# Patient Record
Sex: Female | Born: 1965 | Race: White | Hispanic: No | Marital: Married | Smoking: Never smoker
Health system: Southern US, Community
[De-identification: ages and names within clinical notes are randomized; demographics above are authoritative.]

## PROBLEM LIST (undated history)

## (undated) DIAGNOSIS — T7840XA Allergy, unspecified, initial encounter: Secondary | ICD-10-CM

## (undated) DIAGNOSIS — G709 Myoneural disorder, unspecified: Secondary | ICD-10-CM

## (undated) DIAGNOSIS — R011 Cardiac murmur, unspecified: Secondary | ICD-10-CM

## (undated) DIAGNOSIS — R7989 Other specified abnormal findings of blood chemistry: Secondary | ICD-10-CM

## (undated) DIAGNOSIS — K219 Gastro-esophageal reflux disease without esophagitis: Secondary | ICD-10-CM

## (undated) DIAGNOSIS — Z9889 Other specified postprocedural states: Secondary | ICD-10-CM

## (undated) DIAGNOSIS — M810 Age-related osteoporosis without current pathological fracture: Secondary | ICD-10-CM

## (undated) DIAGNOSIS — E119 Type 2 diabetes mellitus without complications: Secondary | ICD-10-CM

## (undated) DIAGNOSIS — J45909 Unspecified asthma, uncomplicated: Secondary | ICD-10-CM

## (undated) DIAGNOSIS — N189 Chronic kidney disease, unspecified: Secondary | ICD-10-CM

## (undated) DIAGNOSIS — I639 Cerebral infarction, unspecified: Secondary | ICD-10-CM

## (undated) DIAGNOSIS — R112 Nausea with vomiting, unspecified: Secondary | ICD-10-CM

## (undated) DIAGNOSIS — D649 Anemia, unspecified: Secondary | ICD-10-CM

## (undated) DIAGNOSIS — Z87442 Personal history of urinary calculi: Secondary | ICD-10-CM

## (undated) DIAGNOSIS — I10 Essential (primary) hypertension: Secondary | ICD-10-CM

## (undated) DIAGNOSIS — F419 Anxiety disorder, unspecified: Secondary | ICD-10-CM

## (undated) HISTORY — DX: Type 2 diabetes mellitus without complications: E11.9

## (undated) HISTORY — DX: Unspecified asthma, uncomplicated: J45.909

## (undated) HISTORY — DX: Allergy, unspecified, initial encounter: T78.40XA

## (undated) HISTORY — DX: Cardiac murmur, unspecified: R01.1

## (undated) HISTORY — DX: Essential (primary) hypertension: I10

## (undated) HISTORY — DX: Other specified abnormal findings of blood chemistry: R79.89

## (undated) HISTORY — DX: Other disorders of calcium metabolism: E83.59

## (undated) HISTORY — DX: Age-related osteoporosis without current pathological fracture: M81.0

---

## 1998-08-31 DIAGNOSIS — Z87442 Personal history of urinary calculi: Secondary | ICD-10-CM

## 1998-08-31 HISTORY — DX: Personal history of urinary calculi: Z87.442

## 2002-08-31 HISTORY — PX: ROTATOR CUFF REPAIR: SHX139

## 2002-08-31 HISTORY — PX: SHOULDER CLOSED REDUCTION: SHX1051

## 2004-09-18 ENCOUNTER — Emergency Department: Payer: Self-pay | Admitting: Emergency Medicine

## 2004-09-19 IMAGING — CT CT ABD-PELV W/O CM
1 of 3 series · 15 of 32 positions shown, 19 images · non-contrast
Comparison: none

REASON FOR EXAM: (1) rt side pain / stone protocol / [HOSPITAL]; (2) rt side
pain / stone protocol /
COMMENTS:

[Series 3: inspace · axial · 0.71mm/px · z∈[+297,+778]mm · 15 of 752 slices shown, 19 images]
[im 32/752  soft-tissue]
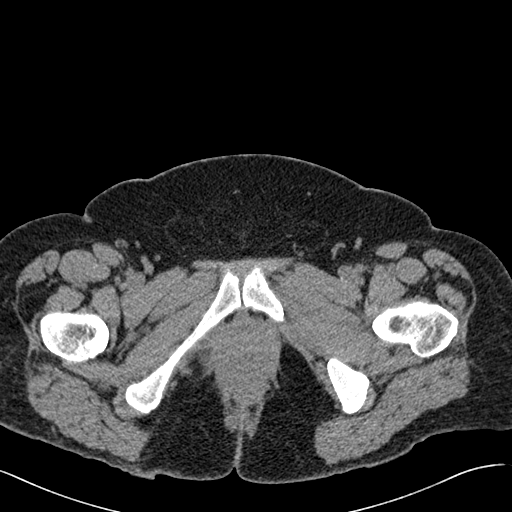
[im 32/752  bone]
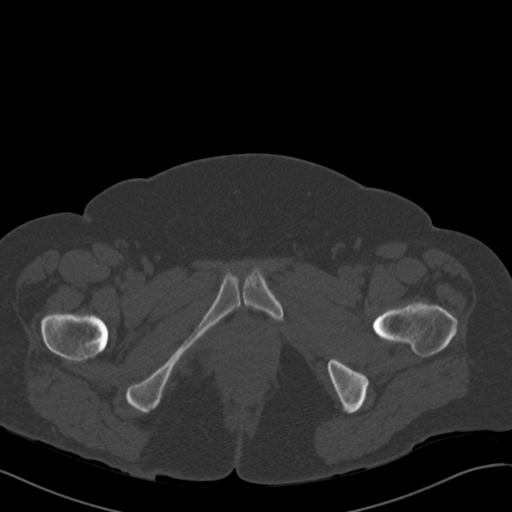
[im 94/752  soft-tissue]
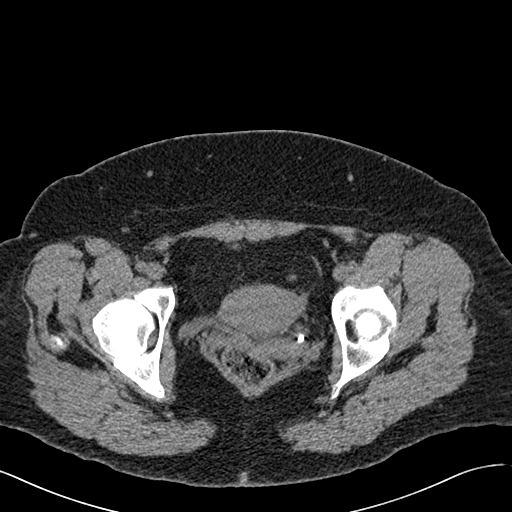
[im 157/752  soft-tissue]
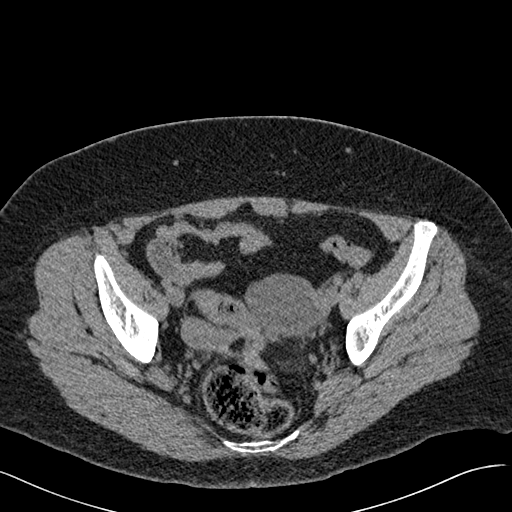
[im 220/752  soft-tissue]
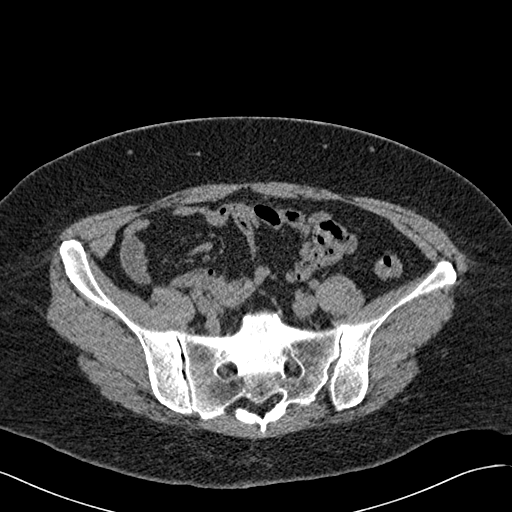
[im 251/752  soft-tissue]
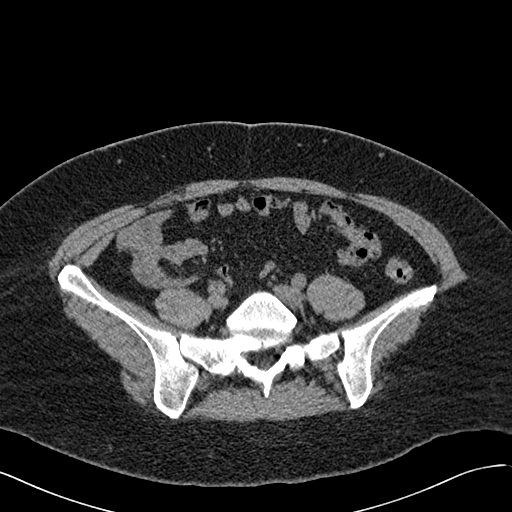
[im 313/752  soft-tissue]
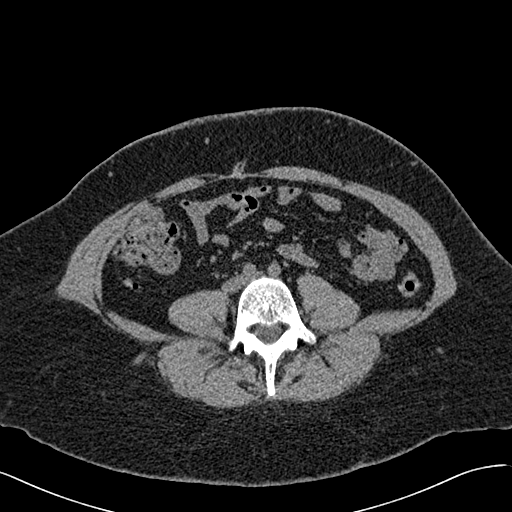
[im 376/752  soft-tissue]
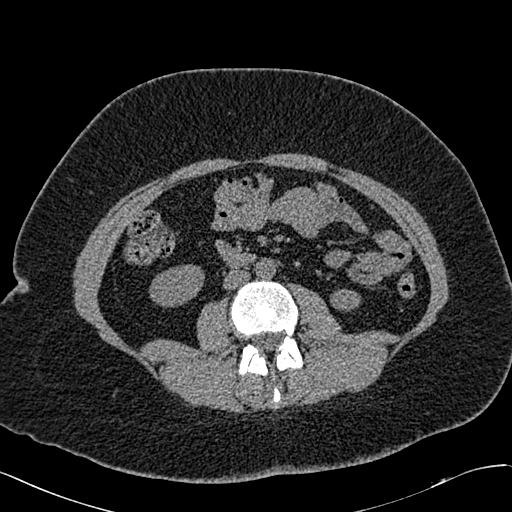
[im 439/752  soft-tissue]
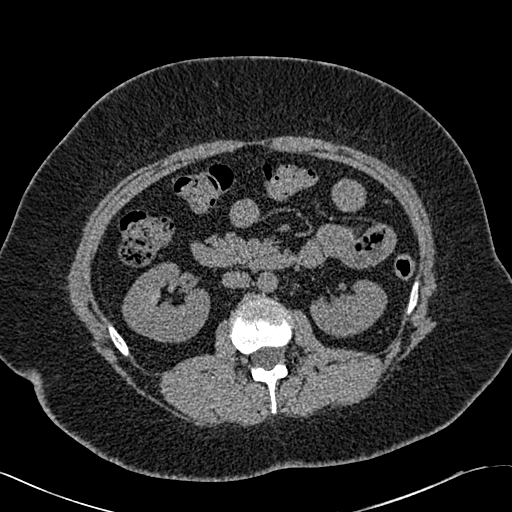
[im 501/752  soft-tissue]
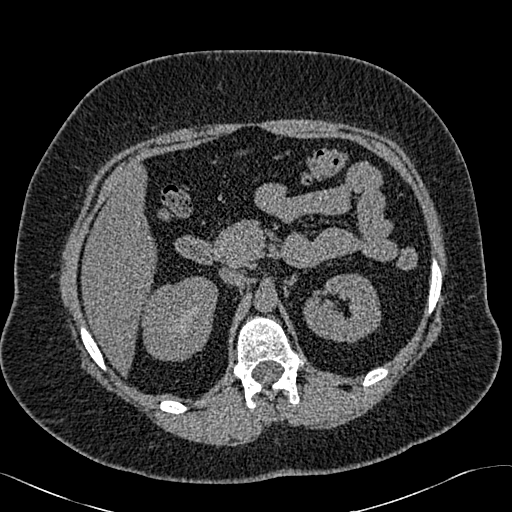
[im 501/752  bone]
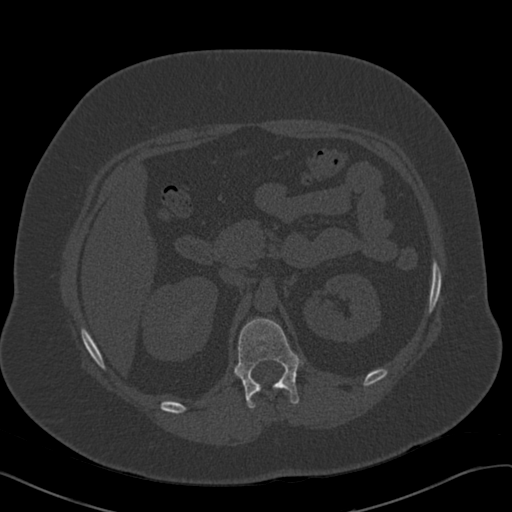
[im 532/752  soft-tissue]
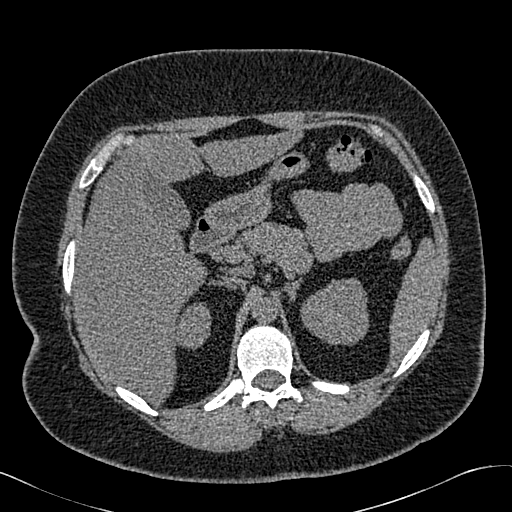
[im 595/752  soft-tissue]
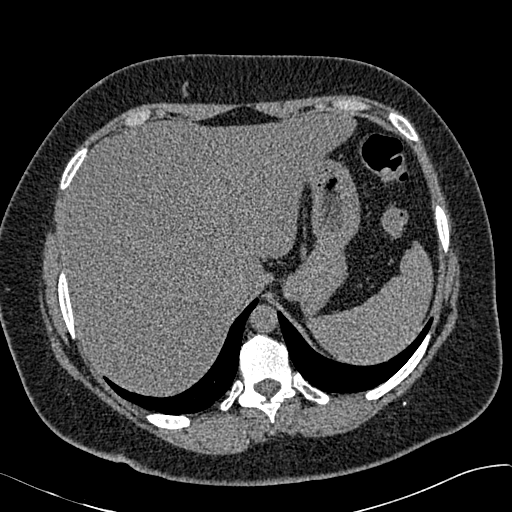
[im 626/752  lung]
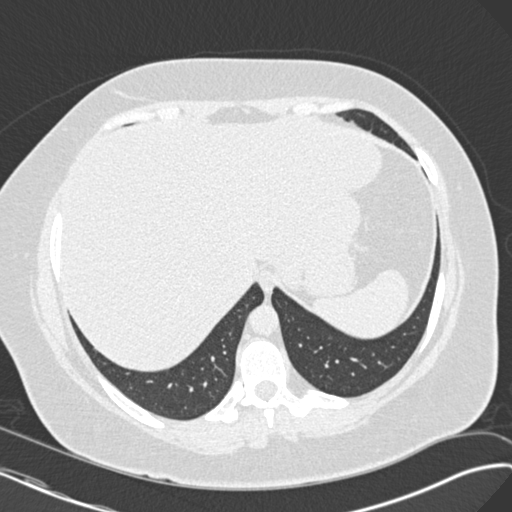
[im 658/752  soft-tissue]
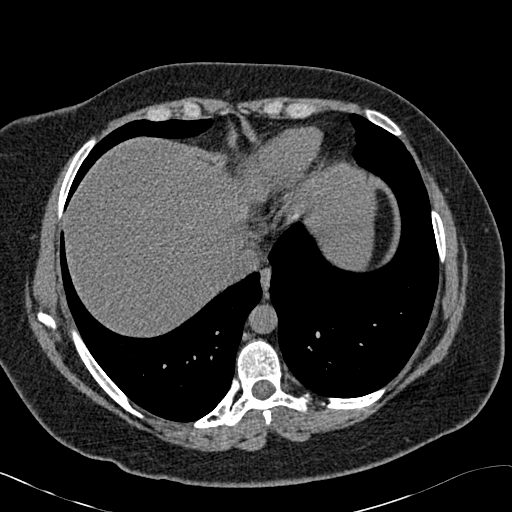
[im 658/752  lung]
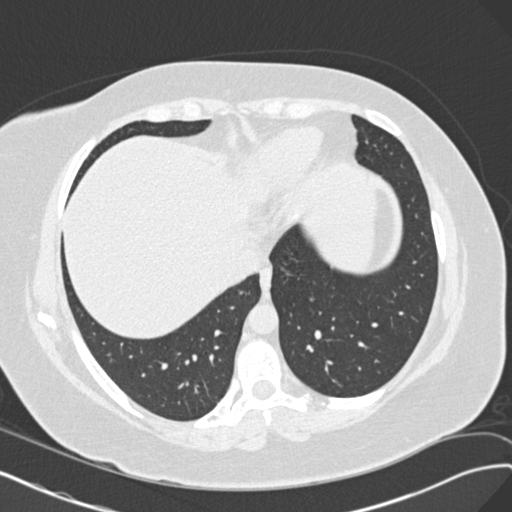
[im 689/752  lung]
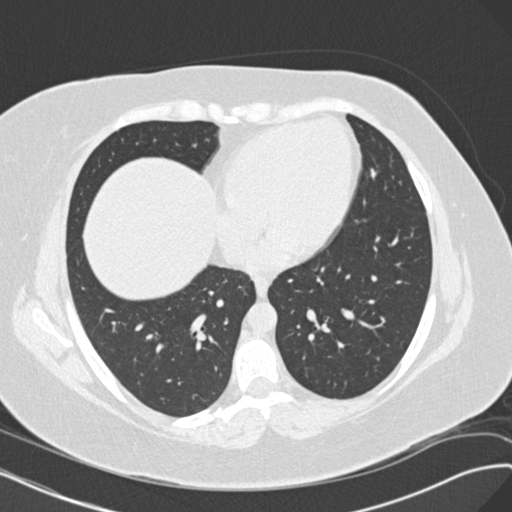
[im 720/752  soft-tissue]
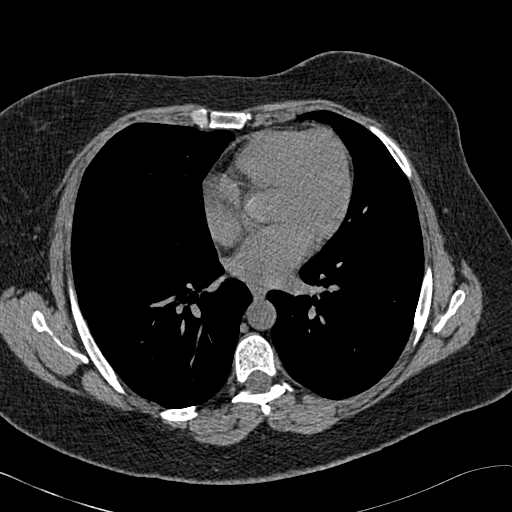
[im 720/752  lung]
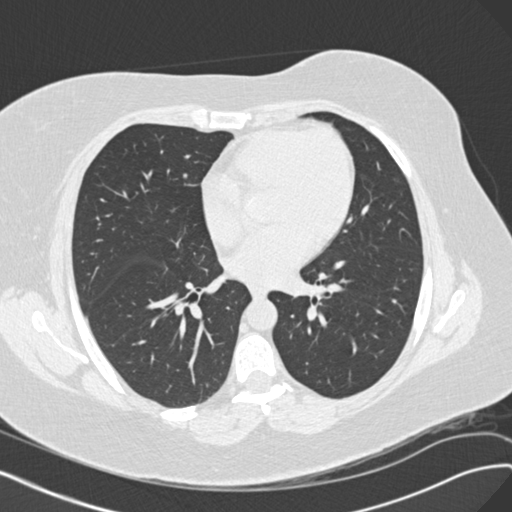

[15 of 32 positions shown; findings below may reference images not displayed]

PROCEDURE:     CT  - CT ABDOMEN AND PELVIS W[DATE] [DATE]

RESULT:         Axial 3-mm noncontrast reconstructions were performed from
the lung bases through the pubic symphysis.  There are no prior studies
available for comparison.

There are 2 small 4-mm nodules visualized within anterior LEFT lung base.
The visualized lung bases are otherwise clear.  The nonuse of intravenous
contrast limits evaluation of the solid abdominal viscera.  The liver is
enlarged and diffusely decreased in attenuation compatible with fatty
infiltration.  No focal liver mass is seen.   The spleen, pancreas,
gallbladder and adrenal glands are unremarkable.

The kidneys are unremarkable in unenhanced appearance bilaterally.  There is
no evidence of renal calculi. The ureters are normal in course and caliber
with their distal portions suboptimally visualized.  Several calcifications
are noted in the region of the distal LEFT ureter, however, these are
thought to most likely be outside the course of the ureter.  No definite
ureteral calculi are seen.

There is no free fluid.  No retroperitoneal adenopathy is seen.  The
appendix is within normal limits.  The small and large bowel are grossly
unremarkable and unopacified appearance.

A cystic lesion is seen within the region of the LEFT adnexa measuring up to
5.0 cm in diameter. The uterus is also heterogenous in appearance suggesting
the presence of fibroids.
IMPRESSION: 1.     No evidence of renal calculi or hydronephrosis.
2.     Cystic mass arising from the LEFT adnexa measuring 5.0 cm in
diameter. This may represent an ovarian cyst although a cystic neoplasm
cannot be excluded.  Follow-up evaluation with ultrasound is recommended.
3.     Hepatomegaly with steatosis.
4.     4-mm indeterminate nodules within the LEFT lower lobe.

These findings were discussed with Dr. DALIO at the time of the
interpretation.

## 2005-08-31 HISTORY — PX: ABDOMINAL HYSTERECTOMY: SHX81

## 2010-03-05 ENCOUNTER — Encounter: Payer: Self-pay | Admitting: Cardiovascular Disease

## 2010-09-30 NOTE — Letter (Signed)
Summary: Historic Patient File  Historic Patient File   Imported By: Shanda Howells 03/06/2010 13:59:21  _____________________________________________________________________  External Attachment:    Type:   Image     Comment:   External Document

## 2010-09-30 NOTE — Letter (Signed)
Summary: Historic Patient File  Historic Patient File   Imported By: Shanda Howells 03/12/2010 11:00:47  _____________________________________________________________________  External Attachment:    Type:   Image     Comment:   External Document

## 2015-08-08 DIAGNOSIS — E785 Hyperlipidemia, unspecified: Secondary | ICD-10-CM | POA: Insufficient documentation

## 2015-09-17 ENCOUNTER — Ambulatory Visit: Payer: Self-pay | Admitting: Cardiovascular Disease

## 2015-10-04 ENCOUNTER — Ambulatory Visit: Payer: Self-pay | Admitting: Cardiovascular Disease

## 2015-10-04 ENCOUNTER — Encounter: Payer: Self-pay | Admitting: *Deleted

## 2016-08-27 DIAGNOSIS — N183 Chronic kidney disease, stage 3 (moderate): Secondary | ICD-10-CM | POA: Diagnosis not present

## 2016-09-02 DIAGNOSIS — Z6830 Body mass index (BMI) 30.0-30.9, adult: Secondary | ICD-10-CM | POA: Diagnosis not present

## 2016-09-02 DIAGNOSIS — I129 Hypertensive chronic kidney disease with stage 1 through stage 4 chronic kidney disease, or unspecified chronic kidney disease: Secondary | ICD-10-CM | POA: Diagnosis not present

## 2016-09-02 DIAGNOSIS — N184 Chronic kidney disease, stage 4 (severe): Secondary | ICD-10-CM | POA: Diagnosis not present

## 2016-09-24 DIAGNOSIS — N184 Chronic kidney disease, stage 4 (severe): Secondary | ICD-10-CM | POA: Diagnosis not present

## 2016-09-29 DIAGNOSIS — E872 Acidosis, unspecified: Secondary | ICD-10-CM | POA: Insufficient documentation

## 2016-09-29 DIAGNOSIS — I129 Hypertensive chronic kidney disease with stage 1 through stage 4 chronic kidney disease, or unspecified chronic kidney disease: Secondary | ICD-10-CM | POA: Diagnosis not present

## 2016-09-29 DIAGNOSIS — N184 Chronic kidney disease, stage 4 (severe): Secondary | ICD-10-CM | POA: Diagnosis not present

## 2016-11-26 DIAGNOSIS — E1122 Type 2 diabetes mellitus with diabetic chronic kidney disease: Secondary | ICD-10-CM | POA: Diagnosis not present

## 2016-11-26 DIAGNOSIS — E872 Acidosis: Secondary | ICD-10-CM | POA: Diagnosis not present

## 2016-11-26 DIAGNOSIS — N185 Chronic kidney disease, stage 5: Secondary | ICD-10-CM | POA: Diagnosis not present

## 2016-11-26 DIAGNOSIS — Z794 Long term (current) use of insulin: Secondary | ICD-10-CM | POA: Diagnosis not present

## 2017-01-18 DIAGNOSIS — N185 Chronic kidney disease, stage 5: Secondary | ICD-10-CM | POA: Diagnosis not present

## 2017-01-18 DIAGNOSIS — E872 Acidosis: Secondary | ICD-10-CM | POA: Diagnosis not present

## 2017-01-18 DIAGNOSIS — I120 Hypertensive chronic kidney disease with stage 5 chronic kidney disease or end stage renal disease: Secondary | ICD-10-CM | POA: Diagnosis not present

## 2017-03-31 DIAGNOSIS — N185 Chronic kidney disease, stage 5: Secondary | ICD-10-CM | POA: Diagnosis not present

## 2017-03-31 DIAGNOSIS — D649 Anemia, unspecified: Secondary | ICD-10-CM | POA: Diagnosis not present

## 2017-03-31 DIAGNOSIS — Z6829 Body mass index (BMI) 29.0-29.9, adult: Secondary | ICD-10-CM | POA: Diagnosis not present

## 2017-05-27 DIAGNOSIS — N185 Chronic kidney disease, stage 5: Secondary | ICD-10-CM | POA: Diagnosis not present

## 2017-06-21 ENCOUNTER — Encounter (INDEPENDENT_AMBULATORY_CARE_PROVIDER_SITE_OTHER): Payer: Self-pay | Admitting: Vascular Surgery

## 2017-06-21 ENCOUNTER — Ambulatory Visit (INDEPENDENT_AMBULATORY_CARE_PROVIDER_SITE_OTHER): Payer: BLUE CROSS/BLUE SHIELD | Admitting: Vascular Surgery

## 2017-06-21 ENCOUNTER — Other Ambulatory Visit (INDEPENDENT_AMBULATORY_CARE_PROVIDER_SITE_OTHER): Payer: Self-pay | Admitting: Vascular Surgery

## 2017-06-21 ENCOUNTER — Other Ambulatory Visit (INDEPENDENT_AMBULATORY_CARE_PROVIDER_SITE_OTHER): Payer: BLUE CROSS/BLUE SHIELD

## 2017-06-21 DIAGNOSIS — I10 Essential (primary) hypertension: Secondary | ICD-10-CM | POA: Diagnosis not present

## 2017-06-21 DIAGNOSIS — N189 Chronic kidney disease, unspecified: Secondary | ICD-10-CM

## 2017-06-21 DIAGNOSIS — N185 Chronic kidney disease, stage 5: Secondary | ICD-10-CM

## 2017-06-21 DIAGNOSIS — Z794 Long term (current) use of insulin: Secondary | ICD-10-CM | POA: Diagnosis not present

## 2017-06-21 DIAGNOSIS — E1122 Type 2 diabetes mellitus with diabetic chronic kidney disease: Secondary | ICD-10-CM | POA: Diagnosis not present

## 2017-06-28 DIAGNOSIS — E872 Acidosis: Secondary | ICD-10-CM | POA: Diagnosis not present

## 2017-06-28 DIAGNOSIS — N185 Chronic kidney disease, stage 5: Secondary | ICD-10-CM | POA: Diagnosis not present

## 2017-06-28 DIAGNOSIS — N189 Chronic kidney disease, unspecified: Secondary | ICD-10-CM | POA: Diagnosis not present

## 2017-06-28 DIAGNOSIS — I120 Hypertensive chronic kidney disease with stage 5 chronic kidney disease or end stage renal disease: Secondary | ICD-10-CM | POA: Diagnosis not present

## 2017-06-29 ENCOUNTER — Telehealth (INDEPENDENT_AMBULATORY_CARE_PROVIDER_SITE_OTHER): Payer: Self-pay | Admitting: Internal Medicine

## 2017-06-29 NOTE — Telephone Encounter (Signed)
Pt spouse called and left a message stating that pt and himself both had questions about an upcoming surgery. They also wanted to know when the surgery is booked for along with when is she to have pre op done. Please advise, thank you!  Call spouse @ [phone]

## 2017-06-30 NOTE — Telephone Encounter (Signed)
Spoke with patient's husband and explained that since this is a fairly new procedure, things are being put in place to proceed, and I will call to schedule when I have all the information.

## 2017-07-12 ENCOUNTER — Encounter (INDEPENDENT_AMBULATORY_CARE_PROVIDER_SITE_OTHER): Payer: Self-pay

## 2017-07-13 ENCOUNTER — Other Ambulatory Visit (INDEPENDENT_AMBULATORY_CARE_PROVIDER_SITE_OTHER): Payer: Self-pay | Admitting: Vascular Surgery

## 2017-07-13 ENCOUNTER — Other Ambulatory Visit: Payer: Self-pay

## 2017-07-13 ENCOUNTER — Encounter
Admission: RE | Admit: 2017-07-13 | Discharge: 2017-07-13 | Disposition: A | Discharge status: Home / Self Care | Payer: BLUE CROSS/BLUE SHIELD | Source: Ambulatory Visit | Attending: Vascular Surgery | Admitting: Vascular Surgery

## 2017-07-13 DIAGNOSIS — Z01810 Encounter for preprocedural cardiovascular examination: Secondary | ICD-10-CM | POA: Diagnosis not present

## 2017-07-13 DIAGNOSIS — Z01812 Encounter for preprocedural laboratory examination: Secondary | ICD-10-CM | POA: Diagnosis not present

## 2017-07-13 HISTORY — DX: Gastro-esophageal reflux disease without esophagitis: K21.9

## 2017-07-13 HISTORY — DX: Nausea with vomiting, unspecified: R11.2

## 2017-07-13 HISTORY — DX: Chronic kidney disease, unspecified: N18.9

## 2017-07-13 HISTORY — DX: Nausea with vomiting, unspecified: Z98.890

## 2017-07-13 LAB — TYPE AND SCREEN
ABO/RH(D): A POS
ANTIBODY SCREEN: NEGATIVE

## 2017-07-13 LAB — BASIC METABOLIC PANEL
ANION GAP: 15 (ref 5–15)
BUN: 56 mg/dL — AB (ref 6–20)
CHLORIDE: 95 mmol/L — AB (ref 101–111)
CO2: 26 mmol/L (ref 22–32)
Calcium: 9.4 mg/dL (ref 8.9–10.3)
Creatinine, Ser: 5.12 mg/dL — ABNORMAL HIGH (ref 0.44–1.00)
GFR calc Af Amer: 10 mL/min — ABNORMAL LOW (ref >60)
GFR, EST NON AFRICAN AMERICAN: 9 mL/min — AB (ref >60)
GLUCOSE: 185 mg/dL — AB (ref 65–99)
POTASSIUM: 4.6 mmol/L (ref 3.5–5.1)
Sodium: 136 mmol/L (ref 135–145)

## 2017-07-13 LAB — CBC WITH DIFFERENTIAL/PLATELET
BASOS ABS: 0.1 10*3/uL (ref 0–0.1)
Basophils Relative: 1 %
EOS PCT: 2 %
Eosinophils Absolute: 0.2 10*3/uL (ref 0–0.7)
HEMATOCRIT: 28.1 % — AB (ref 35.0–47.0)
HEMOGLOBIN: 9.9 g/dL — AB (ref 12.0–16.0)
LYMPHS PCT: 19 %
Lymphs Abs: 1.4 10*3/uL (ref 1.0–3.6)
MCH: 31.9 pg (ref 26.0–34.0)
MCHC: 35.2 g/dL (ref 32.0–36.0)
MCV: 90.7 fL (ref 80.0–100.0)
MONO ABS: 0.5 10*3/uL (ref 0.2–0.9)
Monocytes Relative: 7 %
NEUTROS ABS: 5.5 10*3/uL (ref 1.4–6.5)
Neutrophils Relative %: 71 %
Platelets: 361 10*3/uL (ref 150–440)
RBC: 3.1 MIL/uL — AB (ref 3.80–5.20)
RDW: 13 % (ref 11.5–14.5)
WBC: 7.7 10*3/uL (ref 3.6–11.0)

## 2017-07-13 LAB — PROTIME-INR
INR: 0.85
Prothrombin Time: 11.5 seconds (ref 11.4–15.2)

## 2017-07-13 LAB — APTT: aPTT: 27 seconds (ref 24–36)

## 2017-07-13 NOTE — Patient Instructions (Addendum)
Your procedure is scheduled on: Wed. 11/21 Report to Registration Desk in [place] for procedure in [facility] at Assigned time of 9:00/9:30  Remember: Instructions that are not followed completely may result in serious medical risk, up to and including death, or upon the discretion of your surgeon and anesthesiologist your surgery may need to be rescheduled.     _X__ 1. Do not eat food after midnight the night before your procedure.                 No gum chewing or hard candies. You may drink clear liquids up to 2 hours                 before you are scheduled to arrive for your surgery- DO not drink clear                 liquids within 2 hours of the start of your surgery.                 Clear Liquids include:  water, apple juice without pulp, clear carbohydrate                 drink such as Clearfast of Gartorade, Black Coffee or Tea (Do not add                 anything to coffee or tea).     _X__ 2.  No Alcohol for 24 hours before or after surgery.   _ _ 3.  Do Not Smoke or use e-cigarettes For 24 Hours Prior to Your Surgery.                 Do not use any chewable tobacco products for at least 6 hours prior to                 surgery.  ____  4.  Bring all medications with you on the day of surgery if instructed.   __x__  5.  Notify your doctor if there is any change in your medical condition      (cold, fever, infections).     Do not wear jewelry, make-up, hairpins, clips or nail polish. Do not wear lotions, powders, or perfumes. You may wear deodorant. Do not shave 48 hours prior to surgery. Men may shave face and neck. Do not bring valuables to the hospital.    [facility] is not responsible for any belongings or valuables.  Contacts, dentures or bridgework may not be worn into surgery. Leave your suitcase in the car. After surgery it may be brought to your room. For patients admitted to the hospital, discharge time is  determined by your treatment team.   Patients discharged the day of surgery will not be allowed to drive home.   Please read over the following fact sheets that you were given:    __x__ Take these medicines the morning of surgery with A SIP OF WATER:    1.amLODipine (NORVASC) 10 MG tablet   2.   3.   4.  5.  6.  ____ Fleet Enema (as directed)   __x__ Use CHG Soap as directed  ____ Use inhalers on the day of surgery  ____ Stop metformin 2 days prior to surgery    __x__ Take 1/2 of usual insulin dose the night before surgery. No insulin the morning          of surgery.   ____ Stop Coumadin/Plavix/aspirin on  ____  Stop Anti-inflammatories on    _x___ Stop supplements until after surgery.  Amino Acids (AMINO ACID PO,Take 2 tablets 2 (two) times daily by mouth. Cellular Repair Vitality Pack Supplement  ____ Bring C-Pap to the hospital.

## 2017-07-15 NOTE — Pre-Procedure Instructions (Signed)
Called and left message to return the call to clarify use of essential oils orally.  Instructed during PAT visit to dc all supplements/essential oils 1 week before surgery.  No return call received.

## 2017-07-19 ENCOUNTER — Telehealth (INDEPENDENT_AMBULATORY_CARE_PROVIDER_SITE_OTHER): Payer: Self-pay

## 2017-07-19 NOTE — Telephone Encounter (Signed)
Patient called and left a message with the front office that she wanted to cancel her surgery scheduled for 07/21/17 and did not want to have the surgery. Dr. Delana Meyer was informed as well as scheduling.

## 2017-07-21 ENCOUNTER — Ambulatory Visit
Admission: RE | Admit: 2017-07-21 | Payer: BLUE CROSS/BLUE SHIELD | Source: Ambulatory Visit | Admitting: Vascular Surgery

## 2017-07-21 ENCOUNTER — Encounter: Admission: RE | Payer: Self-pay | Source: Ambulatory Visit

## 2017-07-21 SURGERY — A/V FISTULAGRAM
Anesthesia: General | Laterality: Left

## 2017-07-26 ENCOUNTER — Encounter (INDEPENDENT_AMBULATORY_CARE_PROVIDER_SITE_OTHER): Payer: Self-pay | Admitting: Vascular Surgery

## 2017-07-26 DIAGNOSIS — E1122 Type 2 diabetes mellitus with diabetic chronic kidney disease: Secondary | ICD-10-CM | POA: Insufficient documentation

## 2017-07-26 DIAGNOSIS — I10 Essential (primary) hypertension: Secondary | ICD-10-CM | POA: Insufficient documentation

## 2017-07-26 DIAGNOSIS — N185 Chronic kidney disease, stage 5: Principal | ICD-10-CM

## 2017-07-26 NOTE — Progress Notes (Signed)
MRN : [id number]  [name] is a 51 y.o. (11/09/1965) female who presents with chief complaint of  Chief Complaint  Patient presents with  . New Patient (Initial Visit)    Vein Mapping  .  History of Present Illness: The patient is seen for evaluation for dialysis access. The patient has chronic renal insufficiency stage V secondary to hypertension and diabetes. The patient's most recent creatinine clearance is less than 10. The patient volume status has not yet become an issue. Patient's blood pressures been relatively well controlled. There are mild uremic symptoms which appear to be relatively well tolerated at this time. The patient is right-handed.  The patient has been considering the various methods of dialysis and wishes to proceed with hemodialysis and therefore creation of AV access.  The patient denies amaurosis fugax or recent TIA symptoms. There are no recent neurological changes noted. The patient denies claudication symptoms or rest pain symptoms. The patient denies history of DVT, PE or superficial thrombophlebitis. The patient denies recent episodes of angina or shortness of breath.   Current Meds  Medication Sig  . LANTUS 100 UNIT/ML injection Inject 10 units SQ up to 5 times daily as needed for BS > 150    Past Medical History:  Diagnosis Date  . Chronic kidney disease    End Stage Renal Disease  . Diabetes mellitus without complication ([place])   . GERD (gastroesophageal reflux disease)   . Hypertension   . PONV (postoperative nausea and vomiting)    had bad experience with a block with shoulder surgery. Jerking and required PT    Past Surgical History:  Procedure Laterality Date  . ABDOMINAL HYSTERECTOMY    . ROTATOR CUFF REPAIR Right   . SHOULDER CLOSED REDUCTION      Social History Social History   Tobacco Use  . Smoking status: Never Smoker  . Smokeless tobacco: Never Used  Substance Use Topics  . Alcohol use: No  . Drug use: Not on file     Family History History reviewed. No pertinent family history. No family history of bleeding/clotting disorders, porphyria or autoimmune disease   Allergies  Allergen Reactions  . Enalapril Hives and Other (See Comments)    Angioedema face.  Samuel Germany Dye [Iodinated Diagnostic Agents] Hives  . Lisinopril Shortness Of Breath     REVIEW OF SYSTEMS (Negative unless checked)  Constitutional: [] Weight loss  [] Fever  [] Chills Cardiac: [] Chest pain   [] Chest pressure   [] Palpitations   [] Shortness of breath when laying flat   [] Shortness of breath with exertion. Vascular:  [] Pain in legs with walking   [] Pain in legs at rest  [] History of DVT   [] Phlebitis   [] Swelling in legs   [] Varicose veins   [] Non-healing ulcers Pulmonary:   [] Uses home oxygen   [] Productive cough   [] Hemoptysis   [] Wheeze  [] COPD   [] Asthma Neurologic:  [] Dizziness   [] Seizures   [] History of stroke   [] History of TIA  [] Aphasia   [] Vissual changes   [] Weakness or numbness in arm   [] Weakness or numbness in leg Musculoskeletal:   [] Joint swelling   [] Joint pain   [] Low back pain Hematologic:  [] Easy bruising  [] Easy bleeding   [] Hypercoagulable state   [] Anemic Gastrointestinal:  [] Diarrhea   [] Vomiting  [] Gastroesophageal reflux/heartburn   [] Difficulty swallowing. Genitourinary:  [x] Chronic kidney disease   [] Difficult urination  [] Frequent urination   [] Blood in urine Skin:  [] Rashes   [] Ulcers  Psychological:  [] History  of anxiety   []  History of major depression.  Physical Examination  Vitals:   06/21/17 1635  BP: (!) 231/93  Pulse: 84  Resp: 17  Weight: 86.6 kg (191 lb)  Height: 5\' 7"  (1.702 m)   Body mass index is 29.91 kg/m. Gen: WD/WN, NAD Head: [place]/AT, No temporalis wasting.  Ear/Nose/Throat: Hearing grossly intact, nares w/o erythema or drainage, poor dentition Eyes: PER, EOMI, sclera nonicteric.  Neck: Supple, no masses.  No bruit or JVD.  Pulmonary:  Good air movement, clear to auscultation  bilaterally, no use of accessory muscles.  Cardiac: RRR, normal S1, S2, no Murmurs. Vascular:  Vessel Right Left  Radial Palpable Palpable  Ulnar Palpable Palpable  Brachial Palpable Palpable  Gastrointestinal: soft, non-distended. No guarding/no peritoneal signs.  Musculoskeletal: M/S 5/5 throughout.  No deformity or atrophy.  Neurologic: CN 2-12 intact. Pain and light touch intact in extremities.  Symmetrical.  Speech is fluent. Motor exam as listed above. Psychiatric: Judgment intact, Mood & affect appropriate for pt's clinical situation. Dermatologic: No rashes or ulcers noted.  No changes consistent with cellulitis. Lymph : No Cervical lymphadenopathy, no lichenification or skin changes of chronic lymphedema.  CBC Lab Results  Component Value Date   WBC 7.7 07/13/2017   HGB 9.9 (L) 07/13/2017   HCT 28.1 (L) 07/13/2017   MCV 90.7 07/13/2017   PLT 361 07/13/2017    BMET    Component Value Date/Time   NA 136 07/13/2017 1418   K 4.6 07/13/2017 1418   CL 95 (L) 07/13/2017 1418   CO2 26 07/13/2017 1418   GLUCOSE 185 (H) 07/13/2017 1418   BUN 56 (H) 07/13/2017 1418   CREATININE 5.12 (H) 07/13/2017 1418   CALCIUM 9.4 07/13/2017 1418   GFRNONAA 9 (L) 07/13/2017 1418   GFRAA 10 (L) 07/13/2017 1418   Estimated Creatinine Clearance: 14.5 mL/min (A) (by C-G formula based on SCr of 5.12 mg/dL (H)).  COAG Lab Results  Component Value Date   INR 0.85 07/13/2017    Radiology No results found.   Assessment/Plan 1. Stage 5 chronic renal impairment associated with type 2 diabetes mellitus ([place]) Recommend:  At this time the patient does not have appropriate extremity access for dialysis  Patient should have a endovenous fistula created.  The risks, benefits and alternative therapies were reviewed in detail with the patient.  All questions were answered.  The patient agrees to proceed with surgery.    A total of 65 minutes was spent with this patient and greater than 50%  was spent in counseling and coordination of care with the patient.  Discussion included the treatment options for vascular disease including indications for surgery and intervention.  Also discussed is the appropriate timing of treatment.  In addition medical therapy was discussed.  2. Type 2 diabetes mellitus with stage 5 chronic kidney disease not on chronic dialysis, with long-term current use of insulin (HCC) Continue hypoglycemic medications as already ordered, these medications have been reviewed and there are no changes at this time.  Hgb A1C to be monitored as already arranged by primary service   3. Essential hypertension Continue antihypertensive medications as already ordered, these medications have been reviewed and there are no changes at this time.     [name], MD  07/26/2017 10:36 AM

## 2017-07-28 DIAGNOSIS — N185 Chronic kidney disease, stage 5: Secondary | ICD-10-CM | POA: Diagnosis not present

## 2017-07-28 DIAGNOSIS — Z794 Long term (current) use of insulin: Secondary | ICD-10-CM | POA: Diagnosis not present

## 2017-07-28 DIAGNOSIS — E1121 Type 2 diabetes mellitus with diabetic nephropathy: Secondary | ICD-10-CM | POA: Diagnosis not present

## 2017-07-28 DIAGNOSIS — I120 Hypertensive chronic kidney disease with stage 5 chronic kidney disease or end stage renal disease: Secondary | ICD-10-CM | POA: Diagnosis not present

## 2017-07-28 DIAGNOSIS — Z01818 Encounter for other preprocedural examination: Secondary | ICD-10-CM | POA: Diagnosis not present

## 2017-07-28 DIAGNOSIS — E1122 Type 2 diabetes mellitus with diabetic chronic kidney disease: Secondary | ICD-10-CM | POA: Diagnosis not present

## 2017-07-28 DIAGNOSIS — N186 End stage renal disease: Secondary | ICD-10-CM | POA: Diagnosis not present

## 2017-07-28 LAB — HM HEPATITIS C SCREENING LAB: HM HEPATITIS C SCREENING: NEGATIVE

## 2017-08-26 DIAGNOSIS — E113213 Type 2 diabetes mellitus with mild nonproliferative diabetic retinopathy with macular edema, bilateral: Secondary | ICD-10-CM | POA: Diagnosis not present

## 2017-10-29 DIAGNOSIS — I639 Cerebral infarction, unspecified: Secondary | ICD-10-CM

## 2017-10-29 HISTORY — DX: Cerebral infarction, unspecified: I63.9

## 2017-11-22 ENCOUNTER — Encounter (INDEPENDENT_AMBULATORY_CARE_PROVIDER_SITE_OTHER): Payer: Self-pay | Admitting: Vascular Surgery

## 2017-11-22 ENCOUNTER — Ambulatory Visit (INDEPENDENT_AMBULATORY_CARE_PROVIDER_SITE_OTHER): Payer: BLUE CROSS/BLUE SHIELD | Admitting: Vascular Surgery

## 2017-11-22 VITALS — BP 170/82 | HR 82 | Resp 17 | Ht 67.0 in | Wt 181.0 lb

## 2017-11-22 DIAGNOSIS — E1122 Type 2 diabetes mellitus with diabetic chronic kidney disease: Secondary | ICD-10-CM

## 2017-11-22 DIAGNOSIS — Z794 Long term (current) use of insulin: Secondary | ICD-10-CM | POA: Diagnosis not present

## 2017-11-22 DIAGNOSIS — I10 Essential (primary) hypertension: Secondary | ICD-10-CM | POA: Diagnosis not present

## 2017-11-22 DIAGNOSIS — N185 Chronic kidney disease, stage 5: Secondary | ICD-10-CM | POA: Diagnosis not present

## 2017-11-23 ENCOUNTER — Encounter (INDEPENDENT_AMBULATORY_CARE_PROVIDER_SITE_OTHER): Payer: Self-pay | Admitting: Vascular Surgery

## 2017-11-23 NOTE — Progress Notes (Signed)
MRN : [id number]  [name] is a 52 y.o. ([birth year]) female who presents with chief complaint of  Chief Complaint  Patient presents with  . Follow-up    discuss port placement  .  History of Present Illness:   The patient is seen for follow-up evaluation for dialysis access. The patient has chronic renal insufficiency stage V secondary to hypertension and diabetes. The patient's most recent creatinine clearance is now 7. The patient volume status has not yet become an issue. Patient's blood pressures been relatively well controlled. There are mild uremic symptoms which appear to be relatively well tolerated at this time. The patient is right-handed.  Patient originally was on the schedule but felt that her creatinine clearance had improved and wished to hold off with surgery.  At this point she now feels that she must move forward with securing dialysis access and wishes to proceed.  The patient has been considering the various methods of dialysis and wishes to proceed with hemodialysis and therefore creation of AV access.  The patient denies amaurosis fugax or recent TIA symptoms. There are no recent neurological changes noted. The patient denies claudication symptoms or rest pain symptoms. The patient denies history of DVT, PE or superficial thrombophlebitis. The patient denies recent episodes of angina or shortness of breath.     Current Meds  Medication Sig  . Amino Acids (AMINO ACID PO) Take 2 tablets 2 (two) times daily by mouth. Vitality Pack Supplement  . amLODipine (NORVASC) 10 MG tablet Take 10 mg daily by mouth.   Marland Kitchen LANTUS 100 UNIT/ML injection Inject 10 units SQ up to 5 times daily as needed for BS > 150  . Multiple Vitamins-Minerals (MULTIVITAMIN PO) Take 2 tablets 2 (two) times daily by mouth. Vitality Pack Supplement  . OVER THE COUNTER MEDICATION Take 2 tablets 2 (two) times daily by mouth. Cellular Repair Vitality Pack Supplement    Past Medical History:    Diagnosis Date  . Chronic kidney disease    End Stage Renal Disease  . Diabetes mellitus without complication ([place])   . GERD (gastroesophageal reflux disease)   . Hypertension   . PONV (postoperative nausea and vomiting)    had bad experience with a block with shoulder surgery. Jerking and required PT    Past Surgical History:  Procedure Laterality Date  . ABDOMINAL HYSTERECTOMY    . ROTATOR CUFF REPAIR Right   . SHOULDER CLOSED REDUCTION      Social History Social History   Tobacco Use  . Smoking status: Never Smoker  . Smokeless tobacco: Never Used  Substance Use Topics  . Alcohol use: No  . Drug use: Never    Family History Family History  Problem Relation Age of Onset  . Emphysema Mother   . COPD Mother   . Cancer Father   . Cancer Paternal Aunt     Allergies  Allergen Reactions  . Enalapril Hives and Other (See Comments)    Angioedema face.  Samuel Germany Dye [Iodinated Diagnostic Agents] Hives  . Lisinopril Shortness Of Breath     REVIEW OF SYSTEMS (Negative unless checked)  Constitutional: [] Weight loss  [] Fever  [] Chills Cardiac: [] Chest pain   [] Chest pressure   [] Palpitations   [] Shortness of breath when laying flat   [] Shortness of breath with exertion. Vascular:  [] Pain in legs with walking   [] Pain in legs at rest  [] History of DVT   [] Phlebitis   [] Swelling in legs   [] Varicose veins   []   Non-healing ulcers Pulmonary:   [] Uses home oxygen   [] Productive cough   [] Hemoptysis   [] Wheeze  [] COPD   [] Asthma Neurologic:  [] Dizziness   [] Seizures   [] History of stroke   [] History of TIA  [] Aphasia   [] Vissual changes   [] Weakness or numbness in arm   [] Weakness or numbness in leg Musculoskeletal:   [] Joint swelling   [] Joint pain   [] Low back pain Hematologic:  [] Easy bruising  [] Easy bleeding   [] Hypercoagulable state   [] Anemic Gastrointestinal:  [] Diarrhea   [] Vomiting  [] Gastroesophageal reflux/heartburn   [] Difficulty swallowing. Genitourinary:   [x] Chronic kidney disease   [] Difficult urination  [] Frequent urination   [] Blood in urine Skin:  [] Rashes   [] Ulcers  Psychological:  [] History of anxiety   []  History of major depression.  Physical Examination  Vitals:   11/22/17 1137  BP: (!) 170/82  Pulse: 82  Resp: 17  Weight: 181 lb (82.1 kg)  Height: 5\' 7"  (1.702 m)   Body mass index is 28.35 kg/m. Gen: WD/WN, NAD Head: [place]/AT, No temporalis wasting.  Ear/Nose/Throat: Hearing grossly intact, nares w/o erythema or drainage Eyes: PER, EOMI, sclera nonicteric.  Neck: Supple, no large masses.   Pulmonary:  Good air movement, no audible wheezing bilaterally, no use of accessory muscles.  Cardiac: RRR, no JVD Vascular: Both upper extremities are clean dry and intact. Vessel Right Left  Radial Palpable Palpable  Ulnar Palpable Palpable  Brachial Palpable Palpable  Carotid Palpable Palpable  Gastrointestinal: Non-distended. No guarding/no peritoneal signs.  Musculoskeletal: M/S 5/5 throughout.  No deformity or atrophy.  Neurologic: CN 2-12 intact. Symmetrical.  Speech is fluent. Motor exam as listed above. Psychiatric: Judgment intact, Mood & affect appropriate for pt's clinical situation. Dermatologic: No rashes or ulcers noted.  No changes consistent with cellulitis. Lymph : No lichenification or skin changes of chronic lymphedema.  CBC Lab Results  Component Value Date   WBC 7.7 07/13/2017   HGB 9.9 (L) 07/13/2017   HCT 28.1 (L) 07/13/2017   MCV 90.7 07/13/2017   PLT 361 07/13/2017    BMET    Component Value Date/Time   NA 136 07/13/2017 1418   K 4.6 07/13/2017 1418   CL 95 (L) 07/13/2017 1418   CO2 26 07/13/2017 1418   GLUCOSE 185 (H) 07/13/2017 1418   BUN 56 (H) 07/13/2017 1418   CREATININE 5.12 (H) 07/13/2017 1418   CALCIUM 9.4 07/13/2017 1418   GFRNONAA 9 (L) 07/13/2017 1418   GFRAA 10 (L) 07/13/2017 1418   CrCl cannot be calculated (Patient's most recent lab result is older than the maximum 21 days  allowed.).  COAG Lab Results  Component Value Date   INR 0.85 07/13/2017    Radiology No results found.   Assessment/Plan 1. Stage 5 chronic renal impairment associated with type 2 diabetes mellitus ([place]) Recommend:  At this time the patient does not have appropriate extremity access for dialysis  Patient should have a endovenous fistula created.  The risks, benefits and alternative therapies were reviewed in detail with the patient.  All questions were answered.  The patient agrees to proceed with surgery.    A total of 35 minutes was spent with this patient and greater than 50% was spent in counseling and coordination of care with the patient.  Discussion included the treatment options for vascular disease including indications for surgery and intervention.  Also discussed is the appropriate timing of treatment.  In addition medical therapy was discussed.  2. Type 2 diabetes  mellitus with stage 5 chronic kidney disease not on chronic dialysis, with long-term current use of insulin (HCC) Continue hypoglycemic medications as already ordered, these medications have been reviewed and there are no changes at this time.  Hgb A1C to be monitored as already arranged by primary service   3. Essential hypertension Continue antihypertensive medications as already ordered, these medications have been reviewed and there are no changes at this time.    [name], MD  11/23/2017 9:05 AM

## 2017-11-25 DIAGNOSIS — N185 Chronic kidney disease, stage 5: Secondary | ICD-10-CM | POA: Diagnosis not present

## 2017-11-25 DIAGNOSIS — Z6828 Body mass index (BMI) 28.0-28.9, adult: Secondary | ICD-10-CM | POA: Diagnosis not present

## 2017-11-25 DIAGNOSIS — D649 Anemia, unspecified: Secondary | ICD-10-CM | POA: Diagnosis not present

## 2017-11-29 ENCOUNTER — Encounter (INDEPENDENT_AMBULATORY_CARE_PROVIDER_SITE_OTHER): Payer: Self-pay

## 2017-11-29 ENCOUNTER — Other Ambulatory Visit (INDEPENDENT_AMBULATORY_CARE_PROVIDER_SITE_OTHER): Payer: Self-pay | Admitting: Vascular Surgery

## 2017-12-02 ENCOUNTER — Inpatient Hospital Stay: Admission: RE | Admit: 2017-12-02 | Payer: BLUE CROSS/BLUE SHIELD | Source: Ambulatory Visit

## 2017-12-06 ENCOUNTER — Other Ambulatory Visit (INDEPENDENT_AMBULATORY_CARE_PROVIDER_SITE_OTHER): Payer: Self-pay | Admitting: Vascular Surgery

## 2017-12-06 ENCOUNTER — Encounter
Admission: RE | Admit: 2017-12-06 | Discharge: 2017-12-06 | Disposition: A | Discharge status: Home / Self Care | Payer: BLUE CROSS/BLUE SHIELD | Source: Ambulatory Visit | Attending: Vascular Surgery | Admitting: Vascular Surgery

## 2017-12-06 ENCOUNTER — Other Ambulatory Visit: Payer: Self-pay

## 2017-12-06 DIAGNOSIS — Z794 Long term (current) use of insulin: Secondary | ICD-10-CM | POA: Diagnosis not present

## 2017-12-06 DIAGNOSIS — Z01810 Encounter for preprocedural cardiovascular examination: Secondary | ICD-10-CM | POA: Diagnosis not present

## 2017-12-06 DIAGNOSIS — N185 Chronic kidney disease, stage 5: Secondary | ICD-10-CM | POA: Insufficient documentation

## 2017-12-06 DIAGNOSIS — Z91041 Radiographic dye allergy status: Secondary | ICD-10-CM | POA: Diagnosis not present

## 2017-12-06 DIAGNOSIS — E1122 Type 2 diabetes mellitus with diabetic chronic kidney disease: Secondary | ICD-10-CM | POA: Diagnosis not present

## 2017-12-06 DIAGNOSIS — I120 Hypertensive chronic kidney disease with stage 5 chronic kidney disease or end stage renal disease: Secondary | ICD-10-CM | POA: Insufficient documentation

## 2017-12-06 HISTORY — DX: Myoneural disorder, unspecified: G70.9

## 2017-12-06 HISTORY — DX: Personal history of urinary calculi: Z87.442

## 2017-12-06 HISTORY — DX: Anxiety disorder, unspecified: F41.9

## 2017-12-06 HISTORY — DX: Cerebral infarction, unspecified: I63.9

## 2017-12-06 HISTORY — DX: Anemia, unspecified: D64.9

## 2017-12-06 LAB — CBC WITH DIFFERENTIAL/PLATELET
BASOS ABS: 0.1 10*3/uL (ref 0–0.1)
Basophils Relative: 1 %
EOS PCT: 2 %
Eosinophils Absolute: 0.1 10*3/uL (ref 0–0.7)
HEMATOCRIT: 27.6 % — AB (ref 35.0–47.0)
Hemoglobin: 9.5 g/dL — ABNORMAL LOW (ref 12.0–16.0)
LYMPHS ABS: 1.3 10*3/uL (ref 1.0–3.6)
LYMPHS PCT: 19 %
MCH: 32.8 pg (ref 26.0–34.0)
MCHC: 34.4 g/dL (ref 32.0–36.0)
MCV: 95.2 fL (ref 80.0–100.0)
MONO ABS: 0.4 10*3/uL (ref 0.2–0.9)
MONOS PCT: 5 %
NEUTROS ABS: 5.1 10*3/uL (ref 1.4–6.5)
Neutrophils Relative %: 73 %
PLATELETS: 341 10*3/uL (ref 150–440)
RBC: 2.9 MIL/uL — ABNORMAL LOW (ref 3.80–5.20)
RDW: 13 % (ref 11.5–14.5)
WBC: 6.9 10*3/uL (ref 3.6–11.0)

## 2017-12-06 LAB — PROTIME-INR
INR: 0.86
Prothrombin Time: 11.6 seconds (ref 11.4–15.2)

## 2017-12-06 LAB — BASIC METABOLIC PANEL
ANION GAP: 15 (ref 5–15)
BUN: 45 mg/dL — AB (ref 6–20)
CO2: 26 mmol/L (ref 22–32)
Calcium: 9.4 mg/dL (ref 8.9–10.3)
Chloride: 95 mmol/L — ABNORMAL LOW (ref 101–111)
Creatinine, Ser: 6.56 mg/dL — ABNORMAL HIGH (ref 0.44–1.00)
GFR calc Af Amer: 8 mL/min — ABNORMAL LOW (ref >60)
GFR calc non Af Amer: 7 mL/min — ABNORMAL LOW (ref >60)
GLUCOSE: 287 mg/dL — AB (ref 65–99)
POTASSIUM: 4.4 mmol/L (ref 3.5–5.1)
Sodium: 136 mmol/L (ref 135–145)

## 2017-12-06 LAB — APTT: APTT: 30 s (ref 24–36)

## 2017-12-06 LAB — TYPE AND SCREEN
ABO/RH(D): A POS
Antibody Screen: NEGATIVE
EXTEND SAMPLE REASON: UNDETERMINED

## 2017-12-06 NOTE — Pre-Procedure Instructions (Addendum)
Patient had nerve block on right arm in 2004 and had a reaction to the procedure / medications for several months.  She had jerking of the extremity along with numbness and also difficulty breathing.She has been told that she is completely allergic to all blocks and her doctor does not want her having one for this surgery. Plan is for general anesthesia, according to the patient.  She aggressively utilizes essential oils for of her medical situations.  She injests some and applies topically others while some are directly inhaled of infused.  She states that all her physicians are aware and agree with her use of the oils.  Also of interest, patient has been taking cow molecules to hopefully replace some of her own DNA to improve her renal status.

## 2017-12-07 MED ORDER — CEFAZOLIN SODIUM-DEXTROSE 2-4 GM/100ML-% IV SOLN: 2.0000 g | INTRAVENOUS | Status: DC

## 2017-12-08 ENCOUNTER — Ambulatory Visit: Payer: BLUE CROSS/BLUE SHIELD | Admitting: Anesthesiology

## 2017-12-08 ENCOUNTER — Ambulatory Visit
Admission: RE | Admit: 2017-12-08 | Discharge: 2017-12-08 | Disposition: A | Discharge status: Home / Self Care | Payer: BLUE CROSS/BLUE SHIELD | Source: Ambulatory Visit | Attending: Vascular Surgery | Admitting: Vascular Surgery

## 2017-12-08 ENCOUNTER — Encounter: Payer: Self-pay | Admitting: Anesthesiology

## 2017-12-08 ENCOUNTER — Encounter
Admission: RE | Disposition: A | Discharge status: Home / Self Care | Payer: Self-pay | Source: Ambulatory Visit | Attending: Vascular Surgery

## 2017-12-08 DIAGNOSIS — N185 Chronic kidney disease, stage 5: Secondary | ICD-10-CM | POA: Diagnosis not present

## 2017-12-08 DIAGNOSIS — I120 Hypertensive chronic kidney disease with stage 5 chronic kidney disease or end stage renal disease: Secondary | ICD-10-CM | POA: Diagnosis not present

## 2017-12-08 DIAGNOSIS — N186 End stage renal disease: Secondary | ICD-10-CM | POA: Diagnosis not present

## 2017-12-08 DIAGNOSIS — F419 Anxiety disorder, unspecified: Secondary | ICD-10-CM | POA: Diagnosis not present

## 2017-12-08 DIAGNOSIS — E1122 Type 2 diabetes mellitus with diabetic chronic kidney disease: Secondary | ICD-10-CM | POA: Diagnosis not present

## 2017-12-08 DIAGNOSIS — Z91041 Radiographic dye allergy status: Secondary | ICD-10-CM | POA: Diagnosis not present

## 2017-12-08 DIAGNOSIS — Z794 Long term (current) use of insulin: Secondary | ICD-10-CM | POA: Diagnosis not present

## 2017-12-08 DIAGNOSIS — N289 Disorder of kidney and ureter, unspecified: Secondary | ICD-10-CM

## 2017-12-08 HISTORY — PX: AV FISTULA INSERTION W/ RF MAGNETIC GUIDANCE: CATH118308

## 2017-12-08 LAB — GLUCOSE, CAPILLARY: Glucose-Capillary: 176 mg/dL — ABNORMAL HIGH (ref 65–99)

## 2017-12-08 SURGERY — AV FISTULA INSERTION W/RF MAGNETIC GUIDANCE
Anesthesia: General

## 2017-12-08 MED ORDER — ONDANSETRON HCL 4 MG/2ML IJ SOLN
INTRAMUSCULAR | Status: DC | PRN
  Administered 2017-12-08 (×2): 4 mg via INTRAVENOUS

## 2017-12-08 MED ORDER — ONDANSETRON HCL 4 MG/2ML IJ SOLN
INTRAMUSCULAR | Status: AC
  Filled 2017-12-08: qty 2

## 2017-12-08 MED ORDER — CHLORHEXIDINE GLUCONATE CLOTH 2 % EX PADS: 6.0000 | MEDICATED_PAD | Freq: Once | CUTANEOUS | Status: DC

## 2017-12-08 MED ORDER — NITROGLYCERIN 5 MG/ML IV SOLN
INTRAVENOUS | Status: AC
  Filled 2017-12-08: qty 10

## 2017-12-08 MED ORDER — SODIUM CHLORIDE 0.9 % IV SOLN
INTRAVENOUS | Status: DC
  Administered 2017-12-08: 11:00:00 via INTRAVENOUS

## 2017-12-08 MED ORDER — ONDANSETRON HCL 4 MG/2ML IJ SOLN
4.0000 mg | Freq: Four times a day (QID) | INTRAMUSCULAR | Status: DC | PRN
Start: 2017-12-08 — End: 2017-12-08

## 2017-12-08 MED ORDER — OXYCODONE HCL 5 MG PO TABS: 5.0000 mg | ORAL_TABLET | ORAL | Status: DC | PRN

## 2017-12-08 MED ORDER — PROPOFOL 10 MG/ML IV BOLUS
INTRAVENOUS | Status: AC
  Filled 2017-12-08: qty 20

## 2017-12-08 MED ORDER — PROPOFOL 10 MG/ML IV BOLUS
INTRAVENOUS | Status: DC | PRN
  Administered 2017-12-08: 150 mg via INTRAVENOUS
  Administered 2017-12-08: 50 mg via INTRAVENOUS

## 2017-12-08 MED ORDER — HEPARIN (PORCINE) IN NACL 2-0.9 UNIT/ML-% IJ SOLN
INTRAMUSCULAR | Status: AC
  Filled 2017-12-08: qty 1000

## 2017-12-08 MED ORDER — ONDANSETRON HCL 4 MG/2ML IJ SOLN: 4.0000 mg | Freq: Four times a day (QID) | INTRAMUSCULAR | Status: DC | PRN

## 2017-12-08 MED ORDER — MORPHINE SULFATE (PF) 4 MG/ML IV SOLN: 2.0000 mg | INTRAVENOUS | Status: DC | PRN

## 2017-12-08 MED ORDER — SODIUM CHLORIDE 0.9 % IV SOLN: INTRAVENOUS | Status: DC

## 2017-12-08 MED ORDER — HYDROMORPHONE HCL 1 MG/ML IJ SOLN: 1.0000 mg | Freq: Once | INTRAMUSCULAR | Status: DC | PRN

## 2017-12-08 MED ORDER — LABETALOL HCL 5 MG/ML IV SOLN: 10.0000 mg | INTRAVENOUS | Status: DC | PRN

## 2017-12-08 MED ORDER — CEFAZOLIN SODIUM-DEXTROSE 2-4 GM/100ML-% IV SOLN
INTRAVENOUS | Status: AC
  Filled 2017-12-08: qty 100

## 2017-12-08 MED ORDER — PHENYLEPHRINE HCL 10 MG/ML IJ SOLN
INTRAMUSCULAR | Status: DC | PRN
  Administered 2017-12-08 (×3): 50 ug via INTRAVENOUS
  Administered 2017-12-08 (×2): 100 ug via INTRAVENOUS
  Administered 2017-12-08: 50 ug via INTRAVENOUS
  Administered 2017-12-08: 100 ug via INTRAVENOUS
  Administered 2017-12-08: 50 ug via INTRAVENOUS

## 2017-12-08 MED ORDER — MIDAZOLAM HCL 2 MG/2ML IJ SOLN
INTRAMUSCULAR | Status: DC | PRN
  Administered 2017-12-08: 2 mg via INTRAVENOUS

## 2017-12-08 MED ORDER — SODIUM CHLORIDE 0.9% FLUSH: 3.0000 mL | Freq: Two times a day (BID) | INTRAVENOUS | Status: DC

## 2017-12-08 MED ORDER — METHYLPREDNISOLONE SODIUM SUCC 125 MG IJ SOLR
125.0000 mg | Freq: Once | INTRAMUSCULAR | Status: AC
  Administered 2017-12-08: 125 mg via INTRAVENOUS

## 2017-12-08 MED ORDER — FAMOTIDINE 200 MG/20ML IV SOLN: 40.0000 mg | Freq: Once | INTRAVENOUS | Status: DC

## 2017-12-08 MED ORDER — FAMOTIDINE 20 MG PO TABS: 40.0000 mg | ORAL_TABLET | ORAL | Status: DC | PRN

## 2017-12-08 MED ORDER — FENTANYL CITRATE (PF) 100 MCG/2ML IJ SOLN
INTRAMUSCULAR | Status: DC | PRN
  Administered 2017-12-08 (×2): 50 ug via INTRAVENOUS

## 2017-12-08 MED ORDER — MIDAZOLAM HCL 2 MG/2ML IJ SOLN
INTRAMUSCULAR | Status: AC
  Filled 2017-12-08: qty 2

## 2017-12-08 MED ORDER — HYDRALAZINE HCL 20 MG/ML IJ SOLN: 5.0000 mg | INTRAMUSCULAR | Status: DC | PRN

## 2017-12-08 MED ORDER — ACETAMINOPHEN 325 MG PO TABS: 650.0000 mg | ORAL_TABLET | ORAL | Status: DC | PRN

## 2017-12-08 MED ORDER — FENTANYL CITRATE (PF) 100 MCG/2ML IJ SOLN
INTRAMUSCULAR | Status: AC
  Filled 2017-12-08: qty 2

## 2017-12-08 MED ORDER — METHYLPREDNISOLONE SODIUM SUCC 125 MG IJ SOLR: 125.0000 mg | INTRAMUSCULAR | Status: DC | PRN

## 2017-12-08 MED ORDER — IOPAMIDOL (ISOVUE-300) INJECTION 61%
INTRAVENOUS | Status: DC | PRN
  Administered 2017-12-08: 35 mL via INTRAVENOUS

## 2017-12-08 MED ORDER — VERAPAMIL HCL 2.5 MG/ML IV SOLN
INTRAVENOUS | Status: AC
  Filled 2017-12-08: qty 2

## 2017-12-08 MED ORDER — FENTANYL CITRATE (PF) 100 MCG/2ML IJ SOLN: 25.0000 ug | INTRAMUSCULAR | Status: DC | PRN

## 2017-12-08 MED ORDER — HEPARIN SODIUM (PORCINE) 1000 UNIT/ML IJ SOLN
INTRAMUSCULAR | Status: AC
  Filled 2017-12-08: qty 1

## 2017-12-08 MED ORDER — METHYLPREDNISOLONE SODIUM SUCC 125 MG IJ SOLR
INTRAMUSCULAR | Status: AC
  Filled 2017-12-08: qty 2

## 2017-12-08 MED ORDER — SODIUM CHLORIDE 0.9 % IV SOLN: 250.0000 mL | INTRAVENOUS | Status: DC | PRN

## 2017-12-08 MED ORDER — SODIUM CHLORIDE 0.9% FLUSH: 3.0000 mL | INTRAVENOUS | Status: DC | PRN

## 2017-12-08 MED ORDER — FAMOTIDINE IN NACL 20-0.9 MG/50ML-% IV SOLN
20.0000 mg | Freq: Once | INTRAVENOUS | Status: AC
  Administered 2017-12-08: 20 mg via INTRAVENOUS
  Filled 2017-12-08: qty 50

## 2017-12-08 MED ORDER — DIPHENHYDRAMINE HCL 50 MG/ML IJ SOLN
INTRAMUSCULAR | Status: AC
  Filled 2017-12-08: qty 1

## 2017-12-08 MED ORDER — CEFAZOLIN SODIUM-DEXTROSE 1-4 GM/50ML-% IV SOLN
1.0000 g | Freq: Once | INTRAVENOUS | Status: AC
  Administered 2017-12-08: 1 g via INTRAVENOUS

## 2017-12-08 MED ORDER — DIPHENHYDRAMINE HCL 50 MG/ML IJ SOLN
INTRAMUSCULAR | Status: DC | PRN
  Administered 2017-12-08: 50 mg via INTRAVENOUS

## 2017-12-08 MED ORDER — ONDANSETRON HCL 4 MG/2ML IJ SOLN: 4.0000 mg | Freq: Once | INTRAMUSCULAR | Status: DC | PRN

## 2017-12-08 MED ORDER — HEPARIN SODIUM (PORCINE) 1000 UNIT/ML IJ SOLN
INTRAMUSCULAR | Status: DC | PRN
  Administered 2017-12-08: 3000 [IU] via INTRAVENOUS

## 2017-12-08 SURGICAL SUPPLY — 18 items
CATH BEACON 5 .035 40 KMP TP (CATHETERS) ×2
CATH BEACON 5 .038 40 KMP TP (CATHETERS) ×4
CATH WAVELINQ 6FR (CATHETERS) ×3
COIL 400 COMPLEX STD 6X20CM (Vascular Products) ×3 IMPLANT
DRAPE BRACHIAL (DRAPES) ×3
GUIDEWIRE ADV .018X180CM (WIRE) ×3
HANDLE DETACHMENT COIL (MISCELLANEOUS) ×3
MICROCATH PROGREAT 2.8F 110 CM (CATHETERS) ×3
NEEDLE ENTRY 21GA 7CM ECHOTIP (NEEDLE) ×3
PACK ANGIOGRAPHY (CUSTOM PROCEDURE TRAY) ×3
SET INTRO CAPELLA COAXIAL (SET/KITS/TRAYS/PACK) ×6
SHEATH  7FR 11CM 018 (SHEATH) ×2
SHEATH 6FR 11CM 018 (SHEATH) ×3
SHEATH 7FR 11CM 018 (SHEATH) ×1
SHIELD RADPAD DADD DRAPE 4X9 (MISCELLANEOUS) ×3
SHIELD RADPAD SCOOP 12X17 (MISCELLANEOUS) ×3
VALVE CHECKFLO PERFORMER (SHEATH) ×6
WIRE G V-18X110 (WIRE) ×6

## 2017-12-08 NOTE — Anesthesia Post-op Follow-up Note (Signed)
Anesthesia QCDR form completed.        

## 2017-12-08 NOTE — H&P (Signed)
[facility] History & Physical Update  The patient was interviewed and re-examined.  The patient's previous History and Physical has been reviewed and is unchanged.  There is no change in the plan of care. We plan to proceed with the scheduled procedure.  [name], MD  12/08/2017, 1:00 PM

## 2017-12-08 NOTE — Anesthesia Procedure Notes (Signed)
Procedure Name: LMA Insertion Date/Time: 12/08/2017 12:59 PM Performed by: Jonna Clark, CRNA Pre-anesthesia Checklist: Patient identified, Patient being monitored, Timeout performed, Emergency Drugs available and Suction available Patient Re-evaluated:Patient Re-evaluated prior to induction Oxygen Delivery Method: Circle system utilized Preoxygenation: Pre-oxygenation with 100% oxygen Induction Type: IV induction Ventilation: Mask ventilation without difficulty LMA: LMA inserted LMA Size: 3.5 Tube type: Oral Number of attempts: 1 Placement Confirmation: positive ETCO2 and breath sounds checked- equal and bilateral Tube secured with: Tape Dental Injury: Teeth and Oropharynx as per pre-operative assessment

## 2017-12-08 NOTE — Transfer of Care (Signed)
Immediate Anesthesia Transfer of Care Note  Patient: [name]  Procedure(s) Performed: AV FISTULA INSERTION W/RF MAGNETIC GUIDANCE (N/A )  Patient Location: PACU  Anesthesia Type:General  Level of Consciousness: awake, drowsy and patient cooperative  Airway & Oxygen Therapy: Patient Spontanous Breathing and Patient connected to face mask oxygen  Post-op Assessment: Report given to RN and Post -op Vital signs reviewed and stable  Post vital signs: Reviewed and stable  Last Vitals:  Vitals Value Taken Time  BP 179/88 12/08/2017  3:55 PM  Temp    Pulse 73 12/08/2017  4:04 PM  Resp 15 12/08/2017  4:04 PM  SpO2 100 % 12/08/2017  4:04 PM  Vitals shown include unvalidated device data.  Last Pain:  Vitals:   12/08/17 1555  TempSrc:   PainSc: Asleep         Complications: No apparent anesthesia complications

## 2017-12-08 NOTE — Anesthesia Postprocedure Evaluation (Signed)
Anesthesia Post Note  Patient: [name]  Procedure(s) Performed: AV FISTULA INSERTION W/RF MAGNETIC GUIDANCE (N/A )  Patient location during evaluation: PACU Anesthesia Type: General Level of consciousness: awake and alert and oriented Pain management: pain level controlled Vital Signs Assessment: post-procedure vital signs reviewed and stable Respiratory status: spontaneous breathing, nonlabored ventilation and respiratory function stable Cardiovascular status: blood pressure returned to baseline and stable Postop Assessment: no signs of nausea or vomiting Anesthetic complications: no     Last Vitals:  Vitals:   12/08/17 1711 12/08/17 1730  BP: (!) 190/94 (!) 178/91  Pulse: 92 89  Resp: 20 20  Temp:    SpO2: 96% 96%    Last Pain:  Vitals:   12/08/17 1730  TempSrc:   PainSc: 0-No pain                 Alfrieda Tarry

## 2017-12-08 NOTE — Anesthesia Preprocedure Evaluation (Addendum)
Anesthesia Evaluation  Patient identified by MRN, date of birth, ID band Patient awake    Reviewed: Allergy & Precautions, NPO status , Patient's Chart, lab work & pertinent test results  History of Anesthesia Complications (+) PONV and history of anesthetic complications  Airway Mallampati: II  TM Distance: >3 FB     Dental   Pulmonary neg pulmonary ROS,    Pulmonary exam normal        Cardiovascular hypertension, Pt. on medications (-) Past MI and (-) CHF Normal cardiovascular exam(-) dysrhythmias (-) Valvular Problems/Murmurs     Neuro/Psych Anxiety Pt with an episode 3 weeks ago with R arm and leg shaking. Self-limited. Pt treated with essential oils. No further recurrence.    GI/Hepatic Neg liver ROS, GERD  Medicated and Controlled,  Endo/Other  diabetes, Type 2, Oral Hypoglycemic Agents, Insulin Dependent  Renal/GU ESRFRenal disease     Musculoskeletal   Abdominal Normal abdominal exam  (+)   Peds negative pediatric ROS (+)  Hematology  (+) anemia ,   Anesthesia Other Findings   Reproductive/Obstetrics                            Anesthesia Physical Anesthesia Plan  ASA: III  Anesthesia Plan: General   Post-op Pain Management:    Induction: Intravenous  PONV Risk Score and Plan:   Airway Management Planned: LMA  Additional Equipment:   Intra-op Plan:   Post-operative Plan: Extubation in OR  Informed Consent: I have reviewed the patients History and Physical, chart, labs and discussed the procedure including the risks, benefits and alternatives for the proposed anesthesia with the patient or authorized representative who has indicated his/her understanding and acceptance.   Dental advisory given  Plan Discussed with: CRNA and Surgeon  Anesthesia Plan Comments:       Anesthesia Quick Evaluation

## 2017-12-08 NOTE — Op Note (Signed)
[facility]  Percutaneous Study/Intervention Procedural Note   Date of Surgery: 12/08/2017,7:04 PM  Surgeon:Schnier, Dolores Lory   Pre-operative Diagnosis: Stage V renal insufficiency not yet on dialysis  Post-operative diagnosis:  Same  Procedure(s) Performed:  1.  Ultrasound-guided placement of 7 French venous sheath left brachial vein  2.  Ultrasound-guided placement of 6 French venous sheath left brachial artery  3.  Left arm angiography first-order catheter placement  4.  Left arm venography first-order catheter placement  5.  Creation of a ulnar artery to ulnar vein fistula using the [place] radio frequency catheter  6.  Coil embolization of the distal brachial vein    Anesthesia: General  Sheath: 7 French sheath left brachial vein; 6 French sheath left brachial artery  Contrast: 35 cc   Fluoroscopy Time: 20.2 minutes  Indications: The patient is a 52 year old woman with stage V renal insufficiency who is not yet on hemodialysis.  She has been evaluated for creation of a fistula and found to have adequate venous anatomy for endovascular fistula creation.  The risks and benefits of been reviewed with the patient all questions have been answered patient agrees to proceed.  Procedure:  [name] a 52 y.o. female who was identified and appropriate procedural time out was performed.  The patient was then placed supine on the table and prepped and draped in the usual sterile fashion.  Ultrasound was used to evaluate the neurovascular bundle of the arm at the brachial level.  Paired brachial veins as well as the brachial artery and the nerves were all identified.  Small amount of lidocaine was infiltrated in the skin and using a micro needle with ultrasound guidance the brachial vein is accessed.  Initially the medial vein is accessed.  The vein is noted to be echolucent compressible indicating patency images recorded for the permanent record and micro  needle is inserted under direct visualization.  Microwire was then advanced followed by the micro sheath.  However backbleeding was not noted from the micro sheath and therefore attempts at rewiring this were performed but this was unsuccessful.  Ultimately the micro sheath was removed and pressure was held for 5 minutes.  The ultrasound was then used to assess the lateral vein.  This was subsequently accessed the lateral vein was noted to be echolucent compressible indicating patency images recorded for the permanent record and the accessed with a micro needle was under direct ultrasound visualization microwire was advanced followed by micro-sheath.  Good backbleeding was noted and the 018 wire was advanced and then a 7 French sheath placed.  Once the venous sheath had been secured the ultrasound was again utilized the brachial artery was identified it was echolucent and pulsatile indicating patency images recorded for the permanent record.  The nerve was located directly on top of the artery and 3 cc of lidocaine was then used under ultrasound guidance to create a space in between the artery and the nerve.  Once this infusion was completed direct access to the artery was quite easy and the micro needle was inserted without difficulty and a single pass.  Microwire was then advanced followed by micro-sheath.  The 018 wire was then advanced followed by a 6 [place] sheath.  Having secured both arterial and venous access 3000 units of heparin was given.  Arteriography was then performed.  This served as a roadmap and using a 0.018 V-18 wire and a Kumpe catheter the catheter was negotiated into the ulnar artery.  Hand-injection contrast  was used to verify positioning within the ulnar artery.  And then the V 18 wire was reintroduced and the Kumpe catheter removed.  The Kumpe catheter and a second V 18 wire was then advanced through the venous sheath.  Multiple small injections of contrast were utilized performing  venography as the catheter and wire were advanced since the valves prevented imaging the entire venous anatomy from one proximal injection.  Working with the arterial wire as a guide the venous Kumpe and wire were negotiated down into the ulnar vein side-by-side with the arterial wire.  Again along the way venography was performed and this identified the location of the perforators and allowed [place] to choose the optimal site for creation of the fistula.  The arterial portion of the [facility] catheter was then advanced down to the selected zone of creation.  It was then oriented to face the vein.  Next the venous catheter was advanced over the wire.  As it was advanced we adjusted for orientation so that the electrode was facing the arterial catheter.  Once the 2 catheters were in position and the magnets were aligned spacing was checked multiple views including 90 degree orthogonal views were obtained verifying proper spacing and positioning.  The wires were removed from the catheters.  The arterial wire was removed completely the venous wire was pulled back to the level of the antecubital fossa.  Once this was achieved the electrode was deployed and a 2-second burst of RF energy was applied.  The electrode clearly made contact with the footplate.  The electrode was then disengaged.  Hand-injection through the arterial sheath now demonstrated a successful and complete AV fistula which showed rapid filling of the cephalic vein through several perforators.  Several more small injections were performed to plan the proper location for placing the coil.  This would preserve all perforators to maximize flow to the superficial veins.  Once the site of coiling had been selected the Kumpe catheter was advanced followed by a prograde catheter and then a single 6 mm x 12 cm Ruby coil was deployed in the brachial vein blocking the distal brachial vein as venous outflow and maximizing venous flow to the superficial  veins.  Both sheaths were then pulled and direct manual pressure was held for 20 minutes.  At the conclusion of the procedure there was no evidence of hematoma formation.  Excellent bruit is auscultated.  There is preservation of distal arterial flow.  Dressings were applied.  Patient tolerated the procedure well.  Disposition: Patient was taken to the recovery room in stable condition having tolerated the procedure well.  Belenda Cruise Schnier 12/08/2017,7:04 PM

## 2017-12-09 ENCOUNTER — Encounter: Payer: Self-pay | Admitting: Vascular Surgery

## 2017-12-09 LAB — GLUCOSE, CAPILLARY: Glucose-Capillary: 171 mg/dL — ABNORMAL HIGH (ref 65–99)

## 2017-12-10 ENCOUNTER — Encounter: Payer: Self-pay | Admitting: Vascular Surgery

## 2017-12-13 ENCOUNTER — Encounter: Payer: Self-pay | Admitting: Vascular Surgery

## 2017-12-30 ENCOUNTER — Ambulatory Visit (INDEPENDENT_AMBULATORY_CARE_PROVIDER_SITE_OTHER): Payer: BLUE CROSS/BLUE SHIELD | Admitting: Vascular Surgery

## 2018-01-05 ENCOUNTER — Other Ambulatory Visit (INDEPENDENT_AMBULATORY_CARE_PROVIDER_SITE_OTHER): Payer: Self-pay | Admitting: Vascular Surgery

## 2018-01-05 DIAGNOSIS — N185 Chronic kidney disease, stage 5: Secondary | ICD-10-CM

## 2018-01-06 ENCOUNTER — Encounter (INDEPENDENT_AMBULATORY_CARE_PROVIDER_SITE_OTHER): Payer: Self-pay | Admitting: Vascular Surgery

## 2018-01-06 ENCOUNTER — Ambulatory Visit (INDEPENDENT_AMBULATORY_CARE_PROVIDER_SITE_OTHER): Payer: BLUE CROSS/BLUE SHIELD

## 2018-01-06 ENCOUNTER — Ambulatory Visit (INDEPENDENT_AMBULATORY_CARE_PROVIDER_SITE_OTHER): Payer: BLUE CROSS/BLUE SHIELD | Admitting: Vascular Surgery

## 2018-01-06 VITALS — BP 218/90 | HR 90 | Resp 14 | Ht 67.0 in | Wt 167.0 lb

## 2018-01-06 DIAGNOSIS — E872 Acidosis: Secondary | ICD-10-CM | POA: Diagnosis not present

## 2018-01-06 DIAGNOSIS — I10 Essential (primary) hypertension: Secondary | ICD-10-CM

## 2018-01-06 DIAGNOSIS — Z794 Long term (current) use of insulin: Secondary | ICD-10-CM

## 2018-01-06 DIAGNOSIS — E1122 Type 2 diabetes mellitus with diabetic chronic kidney disease: Secondary | ICD-10-CM

## 2018-01-06 DIAGNOSIS — Z7982 Long term (current) use of aspirin: Secondary | ICD-10-CM | POA: Diagnosis not present

## 2018-01-06 DIAGNOSIS — N185 Chronic kidney disease, stage 5: Secondary | ICD-10-CM | POA: Diagnosis not present

## 2018-01-06 DIAGNOSIS — D649 Anemia, unspecified: Secondary | ICD-10-CM | POA: Diagnosis not present

## 2018-01-06 DIAGNOSIS — I120 Hypertensive chronic kidney disease with stage 5 chronic kidney disease or end stage renal disease: Secondary | ICD-10-CM | POA: Diagnosis not present

## 2018-01-06 DIAGNOSIS — N281 Cyst of kidney, acquired: Secondary | ICD-10-CM | POA: Diagnosis not present

## 2018-01-06 NOTE — Progress Notes (Signed)
Patient ID: [name], female   DOB: 06/11/1966, 52 y.o.   MRN: [id number]  No chief complaint on file.   HPI [name] is a 52 y.o. female.    The patient returns to the office for followup of their dialysis access. The function of the access has been stable.    Procedure(s) Performed 12/08/2017:             1.  Ultrasound-guided placement of 7 French venous sheath left brachial vein             2.  Ultrasound-guided placement of 6 French venous sheath left brachial artery             3.  Left arm angiography first-order catheter placement             4.  Left arm venography first-order catheter placement             5.  Creation of a ulnar artery to ulnar vein fistula using the [place]avel[place] radio frequency catheter             6.  Coil embolization of the distal brachial vein   The patient denies hand pain or other symptoms consistent with steal phenomena.  No significant arm swelling.  The patient denies redness or swelling at the access site. The patient denies fever or chills at home or while on dialysis.  The patient denies amaurosis fugax or recent TIA symptoms. There are no recent neurological changes noted. The patient denies claudication symptoms or rest pain symptoms. The patient denies history of DVT, PE or superficial thrombophlebitis. The patient denies recent episodes of angina or shortness of breath.   Duplex ultrasound shows a widely patent fistula with filling of the cephalic.  There is also a fairly large secondary branch that is predominantly filling the deep system.  This appears to originate from just distal to the actual fistula creation.    Past Medical History:  Diagnosis Date  . Anemia    vitamin d3 deficiency  . Anxiety   . Chronic kidney disease    End Stage Renal Disease  . Diabetes mellitus without complication ([place])   . GERD (gastroesophageal reflux disease)    nothing over last few years  . History of kidney stones 2000  . Hypertension     . Neuromuscular disorder (HCC)    neuropathy in feet  . PONV (postoperative nausea and vomiting)    severe nausea requiring many doses of post op antiemetics  . Stroke ([place]) 10/2017   thinks she had a series of mini strokes.right leg up to right side of face were numb. no loss of consciousness    Past Surgical History:  Procedure Laterality Date  . ABDOMINAL HYSTERECTOMY  2007  . AV FISTULA INSERTION W/ RF MAGNETIC GUIDANCE N/A 12/08/2017   Procedure: AV FISTULA INSERTION W/RF MAGNETIC GUIDANCE;  Surgeon: Katha Cabal, MD;  Location: [facility];  Service: Cardiovascular;  Laterality: N/A;  . ROTATOR CUFF REPAIR Right 2004  . SHOULDER CLOSED REDUCTION Right 2004      Allergies  Allergen Reactions  . Enalapril Hives and Other (See Comments)    Angioedema face.  Samuel Germany Dye [Iodinated Diagnostic Agents] Hives  . Lisinopril Shortness Of Breath  . Shellfish Allergy Swelling and Other (See Comments)    patient tolerates shellfish by mouth without problem    Current Outpatient Medications  Medication Sig Dispense Refill  . Amino Acids (AMINO ACID PO)  Take 2 tablets by mouth 2 (two) times daily. Vitality Pack Supplement Amino Acid    . amLODipine (NORVASC) 5 MG tablet Take 5 mg by mouth daily. Takes at night    . Charcoal Activated 260 MG CAPS Take 2 capsules by mouth 3 (three) times daily after meals.    . cholecalciferol (VITAMIN D) 1000 units tablet Take 5,000 Units by mouth daily.    Marland Kitchen LANTUS 100 UNIT/ML injection Inject 70 units SQ at bedtime  2  . Methyl Salicylate (WINTERGREEN OIL) OIL Take 5 application by mouth. Takes 5 gtts daily as a blood thinner that does not affect kidneys     . Multiple Vitamins-Minerals (MULTIVITAMIN PO) Take 2 tablets 2 (two) times daily by mouth. Vitality Pack Supplement    . NON FORMULARY as needed (fragrant oils used via inhaling and diffuser).    Marland Kitchen OVER THE COUNTER MEDICATION Take 2 tablets 2 (two) times daily by mouth. Cellular  Repair Vitality Pack Supplement    . peppermint oil liquid as needed (uses on skin).     No current facility-administered medications for this visit.         Physical Exam There were no vitals taken for this visit. Gen:  WD/WN, NAD Skin: incision C/D/I; left arm AV fistula good thrill good bruit     Assessment/Plan:  1. Stage 5 chronic renal impairment associated with type 2 diabetes mellitus ([place]) Recommend:  The patient is doing well and currently has adequate dialysis access. The patient's dialysis center is not reporting any access issues.  The patient has a patent fistula there is no worry at this time that she will lose her fistula.  The above-noted secondary branch will slow down maturation and at some point should be coiled to direct flow predominantly through the cephalic vein.  This of course will also aid in maturation.  We will discuss this further as far as planning for outpatient coil embolization of this alternative branch.  Patient will follow-up with me in 1 month.   2. Essential hypertension Continue antihypertensive medications as already ordered, these medications have been reviewed and there are no changes at this time.   3. Type 2 diabetes mellitus with stage 5 chronic kidney disease not on chronic dialysis, with long-term current use of insulin (HCC) Continue hypoglycemic medications as already ordered, these medications have been reviewed and there are no changes at this time.  Hgb A1C to be monitored as already arranged by primary service       [name] 01/06/2018, 1:20 PM   This note was created with Dragon medical transcription system.  Any errors from dictation are unintentional.

## 2018-01-07 ENCOUNTER — Encounter (INDEPENDENT_AMBULATORY_CARE_PROVIDER_SITE_OTHER): Payer: Self-pay | Admitting: Vascular Surgery

## 2018-02-07 ENCOUNTER — Ambulatory Visit (INDEPENDENT_AMBULATORY_CARE_PROVIDER_SITE_OTHER): Payer: BLUE CROSS/BLUE SHIELD | Admitting: Vascular Surgery

## 2018-02-07 ENCOUNTER — Encounter (INDEPENDENT_AMBULATORY_CARE_PROVIDER_SITE_OTHER): Payer: Self-pay

## 2018-02-07 ENCOUNTER — Encounter (INDEPENDENT_AMBULATORY_CARE_PROVIDER_SITE_OTHER): Payer: Self-pay | Admitting: Vascular Surgery

## 2018-02-07 VITALS — BP 215/96 | HR 93 | Resp 14 | Ht 67.0 in | Wt 185.0 lb

## 2018-02-07 DIAGNOSIS — Z794 Long term (current) use of insulin: Secondary | ICD-10-CM

## 2018-02-07 DIAGNOSIS — E1122 Type 2 diabetes mellitus with diabetic chronic kidney disease: Secondary | ICD-10-CM

## 2018-02-07 DIAGNOSIS — I10 Essential (primary) hypertension: Secondary | ICD-10-CM

## 2018-02-07 DIAGNOSIS — N185 Chronic kidney disease, stage 5: Secondary | ICD-10-CM

## 2018-02-07 NOTE — Progress Notes (Signed)
MRN : [id number]  [name] is a 52 y.o. (July 06, 1966) female who presents with chief complaint of  Chief Complaint  Patient presents with  . Follow-up    1 month follow up  .  History of Present Illness: The patient returns to the office for followup of their dialysis access. The function of the access has been stable.    Procedure(s) Performed 12/08/2017: 1. Ultrasound-guided placement of 7 French venous sheath left brachial vein 2. Ultrasound-guided placement of 6 French venous sheath left brachial artery 3. Left arm angiography first-order catheter placement 4. Left arm venography first-order catheter placement 5. Creation of a ulnar artery to ulnar vein fistula using theWavelinQradio frequency catheter 6. Coil embolization of the distal brachial vein   The patient denies hand pain or other symptoms consistent with steal phenomena.  No significant arm swelling.  The patient denies redness or swelling at the access site. The patient denies fever or chills at home or while on dialysis.  The patient denies amaurosis fugax or recent TIA symptoms. There are no recent neurological changes noted. The patient denies claudication symptoms or rest pain symptoms. The patient denies history of DVT, PE or superficial thrombophlebitis. The patient denies recent episodes of angina or shortness of breath.     Current Meds  Medication Sig  . Amino Acids (AMINO ACID PO) Take 2 tablets by mouth 2 (two) times daily. Vitality Pack Supplement Amino Acid  . amLODipine (NORVASC) 5 MG tablet Take 5 mg by mouth daily. Takes at night  . aspirin EC 81 MG tablet Take by mouth.  Dortha Kern Fragrance (YLANG-YLANG OIL FRAGRANCE) OIL Take by mouth.  . Charcoal Activated 260 MG CAPS Take 2 capsules by mouth 3 (three) times daily after meals.  . cholecalciferol (VITAMIN D) 1000 units tablet Take 5,000 Units by mouth  daily.  . Flavoring Agent (LEMON FLAVOR) OIL Take by mouth.  . Insulin Syringe-Needle U-100 (B-D INS SYR ULTRAFINE 1CC/30G) 30G X 1/2" 1 ML MISC USE DAILY AS NEEDED, 1-3 TIMES  . LANTUS 100 UNIT/ML injection Inject 70 units SQ at bedtime  . Methyl Salicylate (WINTERGREEN OIL) OIL Take 5 application by mouth. Takes 5 gtts daily as a blood thinner that does not affect kidneys   . Multiple Vitamins-Minerals (MULTIVITAMIN PO) Take 2 tablets 2 (two) times daily by mouth. Vitality Pack Supplement  . NON FORMULARY as needed (fragrant oils used via inhaling and diffuser).  Marland Kitchen OVER THE COUNTER MEDICATION Take 2 tablets 2 (two) times daily by mouth. Cellular Repair Vitality Pack Supplement  . peppermint oil liquid as needed (uses on skin).  Marland Kitchen sevelamer (RENAGEL) 800 MG tablet Take by mouth.    Past Medical History:  Diagnosis Date  . Anemia    vitamin d3 deficiency  . Anxiety   . Chronic kidney disease    End Stage Renal Disease  . Diabetes mellitus without complication ([place])   . GERD (gastroesophageal reflux disease)    nothing over last few years  . History of kidney stones 2000  . Hypertension   . Neuromuscular disorder (HCC)    neuropathy in feet  . PONV (postoperative nausea and vomiting)    severe nausea requiring many doses of post op antiemetics  . Stroke ([place]) 10/2017   thinks she had a series of mini strokes.right leg up to right side of face were numb. no loss of consciousness    Past Surgical History:  Procedure Laterality Date  . ABDOMINAL HYSTERECTOMY  2007  .  AV FISTULA INSERTION W/ RF MAGNETIC GUIDANCE N/A 12/08/2017   Procedure: AV FISTULA INSERTION W/RF MAGNETIC GUIDANCE;  Surgeon: Katha Cabal, MD;  Location: [facility];  Service: Cardiovascular;  Laterality: N/A;  . ROTATOR CUFF REPAIR Right 2004  . SHOULDER CLOSED REDUCTION Right 2004    Social History Social History   Tobacco Use  . Smoking status: Never Smoker  . Smokeless tobacco: Never Used    Substance Use Topics  . Alcohol use: No  . Drug use: Never    Family History Family History  Problem Relation Age of Onset  . Emphysema Mother   . COPD Mother   . Cancer Father   . Cancer Paternal Aunt     Allergies  Allergen Reactions  . Enalapril Hives and Other (See Comments)    Angioedema face.  Samuel Germany Dye [Iodinated Diagnostic Agents] Hives  . Lisinopril Shortness Of Breath  . Shellfish Allergy Swelling and Other (See Comments)    patient tolerates shellfish by mouth without problem     REVIEW OF SYSTEMS (Negative unless checked)  Constitutional: [] Weight loss  [] Fever  [] Chills Cardiac: [] Chest pain   [] Chest pressure   [] Palpitations   [] Shortness of breath when laying flat   [] Shortness of breath with exertion. Vascular:  [] Pain in legs with walking   [] Pain in legs at rest  [] History of DVT   [] Phlebitis   [] Swelling in legs   [] Varicose veins   [] Non-healing ulcers Pulmonary:   [] Uses home oxygen   [] Productive cough   [] Hemoptysis   [] Wheeze  [] COPD   [] Asthma Neurologic:  [] Dizziness   [] Seizures   [] History of stroke   [] History of TIA  [] Aphasia   [] Vissual changes   [] Weakness or numbness in arm   [] Weakness or numbness in leg Musculoskeletal:   [] Joint swelling   [] Joint pain   [] Low back pain Hematologic:  [] Easy bruising  [] Easy bleeding   [] Hypercoagulable state   [] Anemic Gastrointestinal:  [] Diarrhea   [] Vomiting  [] Gastroesophageal reflux/heartburn   [] Difficulty swallowing. Genitourinary:  [x] Chronic kidney disease   [] Difficult urination  [] Frequent urination   [] Blood in urine Skin:  [] Rashes   [] Ulcers  Psychological:  [] History of anxiety   []  History of major depression.  Physical Examination  Vitals:   02/07/18 1326  BP: (!) 215/96  Pulse: 93  Resp: 14  Weight: 185 lb (83.9 kg)  Height: 5\' 7"  (1.702 m)   Body mass index is 28.98 kg/m. Gen: WD/WN, NAD Head: [place]/AT, No temporalis wasting.  Ear/Nose/Throat: Hearing grossly intact, nares  w/o erythema or drainage Eyes: PER, EOMI, sclera nonicteric.  Neck: Supple, no large masses.   Pulmonary:  Good air movement, no audible wheezing bilaterally, no use of accessory muscles.  Cardiac: RRR, no JVD Vascular:  Left AV fistula good thrill and good bruit; vein very prominent in the antecubital fossa  Vessel Right Left  Radial Palpable Palpable  Gastrointestinal: Non-distended. No guarding/no peritoneal signs.  Musculoskeletal: M/S 5/5 throughout.  No deformity or atrophy.  Neurologic: CN 2-12 intact. Symmetrical.  Speech is fluent. Motor exam as listed above. Psychiatric: Judgment intact, Mood & affect appropriate for pt's clinical situation. Dermatologic: No rashes or ulcers noted.  No changes consistent with cellulitis. Lymph : No lichenification or skin changes of chronic lymphedema.  CBC Lab Results  Component Value Date   WBC 6.9 12/06/2017   HGB 9.5 (L) 12/06/2017   HCT 27.6 (L) 12/06/2017   MCV 95.2 12/06/2017   PLT  341 12/06/2017    BMET    Component Value Date/Time   NA 136 12/06/2017 1505   K 4.4 12/06/2017 1505   CL 95 (L) 12/06/2017 1505   CO2 26 12/06/2017 1505   GLUCOSE 287 (H) 12/06/2017 1505   BUN 45 (H) 12/06/2017 1505   CREATININE 6.56 (H) 12/06/2017 1505   CALCIUM 9.4 12/06/2017 1505   GFRNONAA 7 (L) 12/06/2017 1505   GFRAA 8 (L) 12/06/2017 1505   CrCl cannot be calculated (Patient's most recent lab result is older than the maximum 21 days allowed.).  COAG Lab Results  Component Value Date   INR 0.86 12/06/2017   INR 0.85 07/13/2017    Radiology No results found.   Assessment/Plan 1. Stage 5 chronic renal impairment associated with type 2 diabetes mellitus (HCC) Recommend:  The patient is experiencing maturation problems with their dialysis access.  Patient should have a left arm venogram with the intention for intervention and coiling of the previously identified deep branch.  The intention for intervention is to restore  appropriate flow and improve maturation.  As well as improve the quality of dialysis therapy.  The risks, benefits and alternative therapies were reviewed in detail with the patient.  All questions were answered.  The patient agrees to proceed with angio/intervention.      2. Essential hypertension Continue antihypertensive medications as already ordered, these medications have been reviewed and there are no changes at this time.   3. Type 2 diabetes mellitus with stage 5 chronic kidney disease not on chronic dialysis, with long-term current use of insulin (HCC) Continue hypoglycemic medications as already ordered, these medications have been reviewed and there are no changes at this time.  Hgb A1C to be monitored as already arranged by primary service    [name], MD  02/07/2018 1:51 PM

## 2018-02-14 ENCOUNTER — Other Ambulatory Visit (INDEPENDENT_AMBULATORY_CARE_PROVIDER_SITE_OTHER): Payer: Self-pay | Admitting: Vascular Surgery

## 2018-02-15 ENCOUNTER — Encounter
Admission: RE | Disposition: A | Discharge status: Home / Self Care | Payer: Self-pay | Source: Ambulatory Visit | Attending: Vascular Surgery

## 2018-02-15 ENCOUNTER — Ambulatory Visit
Admission: RE | Admit: 2018-02-15 | Discharge: 2018-02-15 | Disposition: A | Discharge status: Home / Self Care | Payer: BLUE CROSS/BLUE SHIELD | Source: Ambulatory Visit | Attending: Vascular Surgery | Admitting: Vascular Surgery

## 2018-02-15 ENCOUNTER — Encounter: Payer: Self-pay | Admitting: *Deleted

## 2018-02-15 DIAGNOSIS — Z91041 Radiographic dye allergy status: Secondary | ICD-10-CM | POA: Insufficient documentation

## 2018-02-15 DIAGNOSIS — Z91013 Allergy to seafood: Secondary | ICD-10-CM | POA: Insufficient documentation

## 2018-02-15 DIAGNOSIS — N185 Chronic kidney disease, stage 5: Secondary | ICD-10-CM | POA: Insufficient documentation

## 2018-02-15 DIAGNOSIS — T82898A Other specified complication of vascular prosthetic devices, implants and grafts, initial encounter: Secondary | ICD-10-CM | POA: Insufficient documentation

## 2018-02-15 DIAGNOSIS — Z90710 Acquired absence of both cervix and uterus: Secondary | ICD-10-CM | POA: Diagnosis not present

## 2018-02-15 DIAGNOSIS — Y832 Surgical operation with anastomosis, bypass or graft as the cause of abnormal reaction of the patient, or of later complication, without mention of misadventure at the time of the procedure: Secondary | ICD-10-CM | POA: Insufficient documentation

## 2018-02-15 DIAGNOSIS — E1140 Type 2 diabetes mellitus with diabetic neuropathy, unspecified: Secondary | ICD-10-CM | POA: Insufficient documentation

## 2018-02-15 DIAGNOSIS — K219 Gastro-esophageal reflux disease without esophagitis: Secondary | ICD-10-CM | POA: Diagnosis not present

## 2018-02-15 DIAGNOSIS — N186 End stage renal disease: Secondary | ICD-10-CM | POA: Diagnosis not present

## 2018-02-15 DIAGNOSIS — Z888 Allergy status to other drugs, medicaments and biological substances status: Secondary | ICD-10-CM | POA: Diagnosis not present

## 2018-02-15 DIAGNOSIS — E1122 Type 2 diabetes mellitus with diabetic chronic kidney disease: Secondary | ICD-10-CM | POA: Diagnosis not present

## 2018-02-15 DIAGNOSIS — Z8673 Personal history of transient ischemic attack (TIA), and cerebral infarction without residual deficits: Secondary | ICD-10-CM | POA: Diagnosis not present

## 2018-02-15 DIAGNOSIS — T82868A Thrombosis of vascular prosthetic devices, implants and grafts, initial encounter: Secondary | ICD-10-CM | POA: Diagnosis not present

## 2018-02-15 DIAGNOSIS — Z87442 Personal history of urinary calculi: Secondary | ICD-10-CM | POA: Insufficient documentation

## 2018-02-15 DIAGNOSIS — I120 Hypertensive chronic kidney disease with stage 5 chronic kidney disease or end stage renal disease: Secondary | ICD-10-CM | POA: Diagnosis not present

## 2018-02-15 DIAGNOSIS — Z992 Dependence on renal dialysis: Secondary | ICD-10-CM | POA: Diagnosis not present

## 2018-02-15 DIAGNOSIS — Z9889 Other specified postprocedural states: Secondary | ICD-10-CM | POA: Diagnosis not present

## 2018-02-15 HISTORY — PX: UPPER EXTREMITY VENOGRAPHY: CATH118272

## 2018-02-15 LAB — POTASSIUM (ARMC VASCULAR LAB ONLY): POTASSIUM (ARMC VASCULAR LAB): 4.1 (ref 3.5–5.1)

## 2018-02-15 LAB — GLUCOSE, CAPILLARY: Glucose-Capillary: 246 mg/dL — ABNORMAL HIGH (ref 65–99)

## 2018-02-15 SURGERY — UPPER EXTREMITY VENOGRAPHY
Anesthesia: Moderate Sedation | Laterality: Left

## 2018-02-15 MED ORDER — HEPARIN (PORCINE) IN NACL 1000-0.9 UT/500ML-% IV SOLN
INTRAVENOUS | Status: AC
  Filled 2018-02-15: qty 1000

## 2018-02-15 MED ORDER — SODIUM CHLORIDE 0.9 % IV SOLN
INTRAVENOUS | Status: DC
  Administered 2018-02-15: 10:00:00 via INTRAVENOUS

## 2018-02-15 MED ORDER — FENTANYL CITRATE (PF) 100 MCG/2ML IJ SOLN
INTRAMUSCULAR | Status: DC | PRN
  Administered 2018-02-15: 25 ug via INTRAVENOUS
  Administered 2018-02-15 (×2): 50 ug via INTRAVENOUS

## 2018-02-15 MED ORDER — CEFAZOLIN SODIUM-DEXTROSE 1-4 GM/50ML-% IV SOLN
1.0000 g | Freq: Once | INTRAVENOUS | Status: AC
  Administered 2018-02-15: 1 g via INTRAVENOUS

## 2018-02-15 MED ORDER — ONDANSETRON HCL 4 MG/2ML IJ SOLN
4.0000 mg | Freq: Four times a day (QID) | INTRAMUSCULAR | Status: DC | PRN
Start: 2018-02-15 — End: 2018-02-15

## 2018-02-15 MED ORDER — HYDROMORPHONE HCL 1 MG/ML IJ SOLN: 1.0000 mg | Freq: Once | INTRAMUSCULAR | Status: DC | PRN

## 2018-02-15 MED ORDER — METHYLPREDNISOLONE SODIUM SUCC 125 MG IJ SOLR
125.0000 mg | INTRAMUSCULAR | Status: DC | PRN
  Administered 2018-02-15: 125 mg via INTRAVENOUS

## 2018-02-15 MED ORDER — LIDOCAINE HCL (PF) 1 % IJ SOLN
INTRAMUSCULAR | Status: AC
  Filled 2018-02-15: qty 30

## 2018-02-15 MED ORDER — METHYLPREDNISOLONE SODIUM SUCC 125 MG IJ SOLR
INTRAMUSCULAR | Status: AC
  Filled 2018-02-15: qty 2

## 2018-02-15 MED ORDER — MIDAZOLAM HCL 2 MG/2ML IJ SOLN
INTRAMUSCULAR | Status: DC | PRN
  Administered 2018-02-15: 1 mg via INTRAVENOUS
  Administered 2018-02-15: 0.5 mg via INTRAVENOUS
  Administered 2018-02-15: 2 mg via INTRAVENOUS

## 2018-02-15 MED ORDER — FAMOTIDINE 20 MG PO TABS
ORAL_TABLET | ORAL | Status: AC
  Filled 2018-02-15: qty 2

## 2018-02-15 MED ORDER — FENTANYL CITRATE (PF) 100 MCG/2ML IJ SOLN
INTRAMUSCULAR | Status: AC
  Filled 2018-02-15: qty 2

## 2018-02-15 MED ORDER — HEPARIN SODIUM (PORCINE) 1000 UNIT/ML IJ SOLN
INTRAMUSCULAR | Status: AC
  Filled 2018-02-15: qty 1

## 2018-02-15 MED ORDER — FAMOTIDINE 20 MG PO TABS
40.0000 mg | ORAL_TABLET | ORAL | Status: DC | PRN
  Administered 2018-02-15: 40 mg via ORAL

## 2018-02-15 MED ORDER — MIDAZOLAM HCL 5 MG/5ML IJ SOLN
INTRAMUSCULAR | Status: AC
  Filled 2018-02-15: qty 5

## 2018-02-15 SURGICAL SUPPLY — 10 items
CATH BEACON 5 .035 40 KMP TP (CATHETERS) ×1 IMPLANT
CATH BEACON 5 .038 40 KMP TP (CATHETERS) ×2
DEVICE TORQUE .025-.038 (MISCELLANEOUS) ×3 IMPLANT
DRAPE BRACHIAL (DRAPES) ×3 IMPLANT
GUIDEWIRE ANGLED .035 180CM (WIRE) ×3 IMPLANT
NEEDLE ENTRY 21GA 7CM ECHOTIP (NEEDLE) ×3 IMPLANT
PACK ANGIOGRAPHY (CUSTOM PROCEDURE TRAY) ×3 IMPLANT
SET INTRO CAPELLA COAXIAL (SET/KITS/TRAYS/PACK) ×3 IMPLANT
SHEATH BRITE TIP 6FRX5.5 (SHEATH) ×3 IMPLANT
SUT MNCRL AB 4-0 PS2 18 (SUTURE) ×3 IMPLANT

## 2018-02-15 NOTE — H&P (Signed)
[facility] History & Physical Update  The patient was interviewed and re-examined.  The patient's previous History and Physical has been reviewed and is unchanged.  There is no change in the plan of care. We plan to proceed with the scheduled procedure.  [name], MD  02/15/2018, 9:13 AM

## 2018-02-15 NOTE — Op Note (Signed)
OPERATIVE NOTE   PROCEDURE: 1. Ultrasound-guided access to left arm brachiocephalic fistula  PRE-OPERATIVE DIAGNOSIS: Complication of dialysis access                                                       End Stage Renal Disease  POST-OPERATIVE DIAGNOSIS: same as above   SURGEON: Katha Cabal, M.D.  ANESTHESIA: Conscious sedation was administered under my direct supervision by the interventional radiology RN.  IV Versed plus fentanyl were utilized. Continuous ECG, pulse oximetry and blood pressure was monitored throughout the entire procedure.  Conscious sedation was for a total of 39 minutes.  ESTIMATED BLOOD LOSS: minimal  FINDING(S): 1. Cephalic vein appears to be 6 to 8 mm in diameter throughout its course.  There is good flow noted on injection near the anastomosis.  The competitive branch identified by ultrasound demonstrates only faint filling it does not appear to be stealing significant flow from the fistula itself.  Of note the patient's cephalic vein courses subfascially much closer to the antecubital fossa than is typically seen  SPECIMEN(S):  None  CONTRAST: 15 cc  FLUOROSCOPY TIME: 1.8 minutes  INDICATIONS: [name] is a 52 y.o. female who  presents with poorly maturing left brachiocephalic fistula.  The patient is scheduled for angiography with possible intervention of the AV access to prevent loss of the permanent access.  The patient is aware the risks include but are not limited to: bleeding, infection, thrombosis of the cannulated access, and possible anaphylactic reaction to the contrast.  The patient acknowledges if the access can not be salvaged a tunneled catheter will be needed and will be placed during this procedure.  The patient is aware of the risks of the procedure and elects to proceed with the angiogram and intervention.  DESCRIPTION: After full informed written consent was obtained, the patient was brought back to the Special Procedure suite and  placed supine position.  Appropriate cardiopulmonary monitors were placed.  The left arm was prepped and draped in the standard fashion.  Appropriate timeout is called.  Several attempts were made using ultrasound guidance at accessing the ulnar vein near the wrist.  This was a very small vein it was single not paired.  Ulnar artery was readily identified by its pulsatility.  Unfortunately the wire would not advance and I elected to access the fistula directly.  The left brachiocephalic access was cannulated with a micropuncture needle under ultrasound guidence.  Ultrasound was used to evaluate the left brachiocephalic access.  It was echolucent and compressible indicating it is patent .  An ultrasound image was acquired for the permanent record.  A micropuncture needle was used to access the cephalic vein under direct ultrasound guidance.  The microwire was then advanced under fluoroscopic guidance without difficulty followed by the micro-sheath.  The J-wire was then advanced and a 6 Fr sheath inserted.  Hand injections were completed to image the access from the arterial anastomosis through the entire access.  The central venous structures were also imaged by hand injections.  Based on the images, the cephalic vein measures 6 to 8 mm throughout its course.  There are a significant number of veins in the anastomotic area but there does not appear to be a single dominant competitive vein which is what the ultrasound suggested.  Therefore coiling was not  undertaken at this time.      The sheath was removed and light pressure was applied.  A sterile bandage was applied to the puncture site.    COMPLICATIONS: None  CONDITION: Unchanged  Katha Cabal, M.D [facility] Office: [phone]  02/15/2018 1:19 PM

## 2018-02-17 ENCOUNTER — Other Ambulatory Visit (INDEPENDENT_AMBULATORY_CARE_PROVIDER_SITE_OTHER): Payer: Self-pay | Admitting: Vascular Surgery

## 2018-02-17 DIAGNOSIS — T829XXD Unspecified complication of cardiac and vascular prosthetic device, implant and graft, subsequent encounter: Secondary | ICD-10-CM

## 2018-02-17 DIAGNOSIS — N186 End stage renal disease: Secondary | ICD-10-CM

## 2018-02-28 ENCOUNTER — Encounter (INDEPENDENT_AMBULATORY_CARE_PROVIDER_SITE_OTHER): Payer: Self-pay | Admitting: Vascular Surgery

## 2018-02-28 ENCOUNTER — Ambulatory Visit (INDEPENDENT_AMBULATORY_CARE_PROVIDER_SITE_OTHER): Payer: BLUE CROSS/BLUE SHIELD

## 2018-02-28 ENCOUNTER — Ambulatory Visit (INDEPENDENT_AMBULATORY_CARE_PROVIDER_SITE_OTHER): Payer: BLUE CROSS/BLUE SHIELD | Admitting: Vascular Surgery

## 2018-02-28 VITALS — BP 194/86 | HR 92 | Resp 18 | Ht 67.0 in | Wt 182.6 lb

## 2018-02-28 DIAGNOSIS — I10 Essential (primary) hypertension: Secondary | ICD-10-CM

## 2018-02-28 DIAGNOSIS — N185 Chronic kidney disease, stage 5: Secondary | ICD-10-CM

## 2018-02-28 DIAGNOSIS — N186 End stage renal disease: Secondary | ICD-10-CM | POA: Diagnosis not present

## 2018-02-28 DIAGNOSIS — E1122 Type 2 diabetes mellitus with diabetic chronic kidney disease: Secondary | ICD-10-CM

## 2018-02-28 DIAGNOSIS — I120 Hypertensive chronic kidney disease with stage 5 chronic kidney disease or end stage renal disease: Secondary | ICD-10-CM

## 2018-02-28 DIAGNOSIS — T829XXD Unspecified complication of cardiac and vascular prosthetic device, implant and graft, subsequent encounter: Secondary | ICD-10-CM

## 2018-03-07 ENCOUNTER — Encounter

## 2018-03-07 ENCOUNTER — Encounter (INDEPENDENT_AMBULATORY_CARE_PROVIDER_SITE_OTHER): Payer: BLUE CROSS/BLUE SHIELD

## 2018-03-07 ENCOUNTER — Encounter: Payer: Self-pay | Admitting: Family Medicine

## 2018-03-07 ENCOUNTER — Ambulatory Visit: Payer: BLUE CROSS/BLUE SHIELD | Admitting: Family Medicine

## 2018-03-07 ENCOUNTER — Ambulatory Visit (INDEPENDENT_AMBULATORY_CARE_PROVIDER_SITE_OTHER): Payer: BLUE CROSS/BLUE SHIELD | Admitting: Vascular Surgery

## 2018-03-07 VITALS — BP 170/82 | HR 97 | Temp 98.3°F | Resp 16 | Ht 66.0 in | Wt 182.5 lb

## 2018-03-07 DIAGNOSIS — Z1231 Encounter for screening mammogram for malignant neoplasm of breast: Secondary | ICD-10-CM

## 2018-03-07 DIAGNOSIS — R011 Cardiac murmur, unspecified: Secondary | ICD-10-CM

## 2018-03-07 DIAGNOSIS — N2581 Secondary hyperparathyroidism of renal origin: Secondary | ICD-10-CM | POA: Diagnosis not present

## 2018-03-07 DIAGNOSIS — I120 Hypertensive chronic kidney disease with stage 5 chronic kidney disease or end stage renal disease: Secondary | ICD-10-CM

## 2018-03-07 DIAGNOSIS — E785 Hyperlipidemia, unspecified: Secondary | ICD-10-CM

## 2018-03-07 DIAGNOSIS — E1122 Type 2 diabetes mellitus with diabetic chronic kidney disease: Secondary | ICD-10-CM | POA: Insufficient documentation

## 2018-03-07 DIAGNOSIS — Z1239 Encounter for other screening for malignant neoplasm of breast: Secondary | ICD-10-CM

## 2018-03-07 DIAGNOSIS — N185 Chronic kidney disease, stage 5: Secondary | ICD-10-CM

## 2018-03-07 DIAGNOSIS — L97509 Non-pressure chronic ulcer of other part of unspecified foot with unspecified severity: Secondary | ICD-10-CM

## 2018-03-07 DIAGNOSIS — E1169 Type 2 diabetes mellitus with other specified complication: Secondary | ICD-10-CM

## 2018-03-07 DIAGNOSIS — Z1211 Encounter for screening for malignant neoplasm of colon: Secondary | ICD-10-CM | POA: Diagnosis not present

## 2018-03-07 DIAGNOSIS — E13621 Other specified diabetes mellitus with foot ulcer: Secondary | ICD-10-CM

## 2018-03-07 DIAGNOSIS — I10 Essential (primary) hypertension: Secondary | ICD-10-CM | POA: Diagnosis not present

## 2018-03-07 DIAGNOSIS — R202 Paresthesia of skin: Secondary | ICD-10-CM | POA: Diagnosis not present

## 2018-03-07 LAB — POCT GLYCOSYLATED HEMOGLOBIN (HGB A1C): Hemoglobin A1C: 10 % — AB (ref 4.0–5.6)

## 2018-03-07 MED ORDER — SEMAGLUTIDE (1 MG/DOSE) 2 MG/1.5ML ~~LOC~~ SOPN: 1.0000 mg | PEN_INJECTOR | SUBCUTANEOUS | 1 refills | Status: DC

## 2018-03-07 MED ORDER — INSULIN ASPART 100 UNIT/ML FLEXPEN
5.0000 [IU] | PEN_INJECTOR | Freq: Every day | SUBCUTANEOUS | 0 refills | Status: DC
Start: 2018-03-07 — End: 2018-04-06

## 2018-03-07 MED ORDER — INSULIN PEN NEEDLE 32G X 6 MM MISC: 1.0000 | Freq: Two times a day (BID) | 1 refills | Status: DC

## 2018-03-07 NOTE — Progress Notes (Signed)
Name: [name]   MRN: [id number]    DOB: Dec 04, 1965   Date:03/07/2018       Progress Note  Subjective  Chief Complaint  Chief Complaint  Patient presents with  . Establish Care    HPI  DMII: diagnosed years ago, type II diabetes but started insulin therapy in 2008. Her hgbA1C has not been at goal. She has multiple complications of diabetes, including hypertension, CKI stage V, dyslipidemia. She has been getting lantus from nephrologist because lost to follow up with PCP. HgbA1C today is above 10. She is not afraid of needles. We will add pre meal insulin and also add Ozempic, no reason to adjust for renal disease. She denies polyphagia, polyuria or polydipsia. She has noticed an ulcer on left first right toe a couple of weeks ago and has been treating at home. She has also noticed paresthesia on both legs lately, sometimes wakes her up at night.   Dyslipidemia: reviewed [facility], she refuses statin therapy, but explained that if triglycerides is very high we may start a rx fish oil. She believes in essential oils. Refuses vaccinations today  Patient Active Problem List   Diagnosis Date Noted  . Secondary hyperparathyroidism of renal origin ([place]) 03/07/2018  . Foot ulcer due to secondary DM ([place]) 03/07/2018  . 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 Stage 5 chronic renal impairment associated with type 2 diabetes mellitus ([place]) 07/26/2017  . Uncontrolled hypertension 07/26/2017  . Hyperphosphatemia 09/29/2016  . Metabolic acidosis 23/53/6144  . Hyperlipidemia 08/08/2015    Past Surgical History:  Procedure Laterality Date  . ABDOMINAL HYSTERECTOMY  2007  . AV FISTULA INSERTION W/ RF MAGNETIC GUIDANCE N/A 12/08/2017   Procedure: AV FISTULA INSERTION W/RF MAGNETIC GUIDANCE;  Surgeon: Katha Cabal, MD;  Location: [facility];  Service: Cardiovascular;  Laterality: N/A;  . ROTATOR CUFF REPAIR Right 2004  .  SHOULDER CLOSED REDUCTION Right 2004  . UPPER EXTREMITY VENOGRAPHY Left 02/15/2018   Procedure: UPPER EXTREMITY VENOGRAPHY;  Surgeon: Katha Cabal, MD;  Location: [place] [facility];  Service: Cardiovascular;  Laterality: Left;    Family History  Problem Relation Age of Onset  . Emphysema Mother   . COPD Mother   . Cancer Father   . Cancer Paternal Aunt   . Diabetes Sister   . Hypertension Sister   . Eczema Sister   . Diabetes Brother   . Hypertension Brother   . Cardiomyopathy Brother   . Alcohol abuse Brother     Social History   Socioeconomic History  . Marital status: Married    Spouse name: Aaron Edelman  . Number of children: 0  . Years of education: Not on file  . Highest education level: Some college, no degree  Occupational History  . Occupation: Clinical cytogeneticist  Social Needs  . Financial resource strain: Not hard at all  . Food insecurity:    Worry: Never true    Inability: Never true  . Transportation needs:    Medical: No    Non-medical: No  Tobacco Use  . Smoking status: Never Smoker  . Smokeless tobacco: Never Used  Substance and Sexual Activity  . Alcohol use: No  . Drug use: Never  . Sexual activity: Yes    Partners: Male  Lifestyle  . Physical activity:    Days per week: 7 days    Minutes per session: 30 min  . Stress: Not  at all  Relationships  . Social connections:    Talks on phone: More than three times a week    Gets together: More than three times a week    Attends religious service: More than 4 times per year    Active member of club or organization: No    Attends meetings of clubs or organizations: Never    Relationship status: Married  . Intimate partner violence:    Fear of current or ex partner: No    Emotionally abused: No    Physically abused: No    Forced sexual activity: No  Other Topics Concern  . Not on file  Social History Narrative  . Not on file     Current Outpatient Medications:  .  Amino Acids  (AMINO ACID PO), Take 2 tablets by mouth 2 (two) times daily. Vitality Pack Supplement Amino Acid, Disp: , Rfl:  .  amLODipine (NORVASC) 5 MG tablet, Take 5 mg by mouth 2 (two) times daily. , Disp: , Rfl:  .  [place] Oil Fragrance (YLANG-YLANG OIL FRAGRANCE) OIL, Take 6 drops by mouth at bedtime. , Disp: , Rfl:  .  Insulin Syringe-Needle U-100 (B-D INS SYR ULTRAFINE 1CC/30G) 30G X 1/2" 1 ML MISC, USE DAILY AS NEEDED, 1-3 TIMES, Disp: , Rfl:  .  LANTUS 100 UNIT/ML injection, Inject 70 units SQ at bedtime, Disp: , Rfl: 2 .  Lemon Oil OIL, 8 drops by Does not apply route., Disp: , Rfl:  .  Magnesium 200 MG TABS, Take 200 mg by mouth 2 (two) times daily., Disp: , Rfl:  .  Methyl Salicylate (WINTERGREEN OIL) OIL, Take 5 application by mouth. Takes 5 gtts daily as a blood thinner that does not affect kidneys , Disp: , Rfl:  .  Multiple Vitamins-Minerals (MULTIVITAMIN PO), Take 2 tablets 2 (two) times daily by mouth. Vitality Pack Supplement, Disp: , Rfl:  .  NON FORMULARY, as needed (fragrant oils used via inhaling and diffuser)., Disp: , Rfl:  .  OVER THE COUNTER MEDICATION, Take 2 tablets 2 (two) times daily by mouth. Cellular Repair Vitality Pack Supplement, Disp: , Rfl:  .  OVER THE COUNTER MEDICATION, Take 4 capsules by mouth 2 (two) times daily. Transfer Factor (Kidney, Glucose, Regular generic), Disp: , Rfl:  .  peppermint oil liquid, Apply 1 application topically as needed (uses on skin). , Disp: , Rfl:  .  Cholecalciferol (VITAMIN D PO), Take 5,000 Units by mouth daily. , Disp: , Rfl:  .  insulin aspart (NOVOLOG FLEXPEN) 100 UNIT/ML FlexPen, Inject 5-15 Units into the skin daily before supper., Disp: 15 mL, Rfl: 0 .  Insulin Pen Needle (NOVOFINE) 32G X 6 MM MISC, 1 each by Does not apply route 2 (two) times daily., Disp: 200 each, Rfl: 1 .  Semaglutide (OZEMPIC) 1 MG/DOSE SOPN, Inject 1 mg into the skin once a week., Disp: 9 mL, Rfl: 1 .  sevelamer (RENAGEL) 800 MG tablet, Take by mouth. , Disp:  , Rfl:   Allergies  Allergen Reactions  . Enalapril Hives and Other (See Comments)    Angioedema face.  Samuel Germany Dye [Iodinated Diagnostic Agents] Hives  . Lisinopril Shortness Of Breath  . Shellfish Allergy Swelling and Other (See Comments)    patient tolerates shellfish by mouth without problem     ROS  Constitutional: Negative for fever or weight change.  Respiratory: Negative for cough and shortness of breath.   Cardiovascular: Negative for chest pain or palpitations.  Gastrointestinal: Negative  for abdominal pain, no bowel changes.  Musculoskeletal: Negative for gait problem or joint swelling.  Skin: Negative for rash. wound on right foot  Neurological: Negative for dizziness or headache.  No other specific complaints in a complete review of systems (except as listed in HPI above).  Objective  Vitals:   03/07/18 1502  BP: (!) 170/82  Pulse: 97  Resp: 16  Temp: 98.3 F (36.8 C)  TempSrc: Oral  SpO2: 97%  Weight: 182 lb 8 oz (82.8 kg)  Height: 5\' 6"  (1.676 m)    Body mass index is 29.46 kg/m.  Physical Exam  Constitutional: Patient appears well-developed and well-nourished. Overweight. No distress.  HEENT: head atraumatic, normocephalic, pupils equal and reactive to light, neck supple, throat within normal limits Cardiovascular: Normal rate, regular rhythm and normal heart sounds.  Systolic ejection  Murmur 2/6  heard. No BLE edema. Pulmonary/Chest: Effort normal and breath sounds normal. No respiratory distress. Abdominal: Soft.  There is no tenderness. Psychiatric: Patient has a normal mood and affect. behavior is normal. Judgment and thought content normal.  Recent Results (from the past 2160 hour(s))  Glucose, capillary     Status: Abnormal   Collection Time: 12/08/17 11:22 AM  Result Value Ref Range   Glucose-Capillary 176 (H) 65 - 99 mg/dL  Glucose, capillary     Status: Abnormal   Collection Time: 12/08/17  3:56 PM  Result Value Ref Range    Glucose-Capillary 171 (H) 65 - 99 mg/dL  Potassium [facility] vascular lab only)     Status: None   Collection Time: 02/15/18  9:04 AM  Result Value Ref Range   Potassium [facility] vascular lab) 4.1 3.5 - 5.1    Comment: Performed at [facility], [address] [place], [place] [address]  Glucose, capillary     Status: Abnormal   Collection Time: 02/15/18  9:22 AM  Result Value Ref Range   Glucose-Capillary 246 (H) 65 - 99 mg/dL  POCT HgB A1C     Status: Abnormal   Collection Time: 03/07/18  3:54 PM  Result Value Ref Range   Hemoglobin A1C 10.0 (A) 4.0 - 5.6 %   HbA1c POC (<> result, manual entry)  4.0 - 5.6 %   HbA1c, POC (prediabetic range)  5.7 - 6.4 %   HbA1c, POC (controlled diabetic range)  0.0 - 7.0 %    Diabetic Foot Exam: Diabetic Foot Exam - Simple   Simple Foot Form Diabetic Foot exam was performed with the following findings:  Yes 03/07/2018  3:51 PM  Visual Inspection See comments:  Yes Sensation Testing See comments:  Yes Pulse Check Posterior Tibialis and Dorsalis pulse intact bilaterally:  Yes Comments Decrease in sensation and on first toe right foot there is induration with an ulcer in the center, no oozing      PHQ2/9: Depression screen PHQ 2/9 03/07/2018  Decreased Interest 0  Down, Depressed, Hopeless 0  PHQ - 2 Score 0  Altered sleeping 0  Tired, decreased energy 0  Change in appetite 0  Feeling bad or failure about yourself  0  Trouble concentrating 0  Moving slowly or fidgety/restless 0  Suicidal thoughts 0  PHQ-9 Score 0     Fall Risk: Fall Risk  03/07/2018  Falls in the past year? No     Functional Status Survey: Is the patient deaf or have difficulty hearing?: No Does the patient have difficulty seeing, even when wearing glasses/contacts?: No Does the patient have difficulty concentrating, remembering, or  making decisions?: No Does the patient have difficulty walking or climbing stairs?: Yes Does the patient have difficulty  dressing or bathing?: No Does the patient have difficulty doing errands alone such as visiting a doctor's office or shopping?: No    Assessment & Plan  1. 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 Chronic kidney disease, stage V (very severe) (HCC)  - Urine Microalbumin w/creat. ratio - COMPLETE METABOLIC PANEL WITH GFR - Hemoglobin and hematocrit, blood  3. Colon cancer screening  - Cologuard  4. Breast cancer screening  - MM DIGITAL SCREENING BILATERAL; Future  5. Dyslipidemia associated with type 2 diabetes mellitus (HCC)  - Lipid panel  6. Secondary hyperparathyroidism of renal origin ([place])   7. Systolic ejection murmur  - Ambulatory referral to Cardiology  8. Uncontrolled hypertension  - Ambulatory referral to Cardiology  9. Foot ulcer due to secondary DM [facility])  - Ambulatory referral to Podiatry  10. Paresthesia of foot, bilateral  - Vitamin B12

## 2018-03-07 NOTE — Patient Instructions (Signed)
lantus once glucose stays fasting between 80-120 you can try going down on lantus by 2 units every 3 days  Novolog start before dinner, sliding scale.   Sugar below 100 nothing 100-150 : 5 units 150-200 : 10 units  200-250 12 units Over 250 15 units  Ozempic start once a week, 0.25 mg weekly for 2 weeks after that can go up to 0.5 mg weekly until out of samples rx is for 1 mg weekly

## 2018-03-12 ENCOUNTER — Encounter (INDEPENDENT_AMBULATORY_CARE_PROVIDER_SITE_OTHER): Payer: Self-pay | Admitting: Vascular Surgery

## 2018-03-12 NOTE — Progress Notes (Signed)
MRN : [id number]  [name] is a 52 y.o. (05-24-1966) female who presents with chief complaint of  Chief Complaint  Patient presents with  . Follow-up    [facility] 1wk HDA  .  History of Present Illness: The patient returns to the office for follow up regarding problem with the dialysis access. Currently the patient is not on dialysis    Recent angiogram showed the fistula is patent anastomosis could not be visualized.  The patient denies hand pain or other symptoms consistent with steal phenomena.  No significant arm swelling.  The patient denies redness or swelling at the access site. The patient denies fever or chills at home or while on dialysis.  The patient denies amaurosis fugax or recent TIA symptoms. There are no recent neurological changes noted. The patient denies claudication symptoms or rest pain symptoms. The patient denies history of DVT, PE or superficial thrombophlebitis. The patient denies recent episodes of angina or shortness of breath.      Current Meds  Medication Sig  . Amino Acids (AMINO ACID PO) Take 2 tablets by mouth 2 (two) times daily. Vitality Pack Supplement Amino Acid  . amLODipine (NORVASC) 5 MG tablet Take 5 mg by mouth 2 (two) times daily.   Dortha Kern Fragrance (YLANG-YLANG OIL FRAGRANCE) OIL Take 6 drops by mouth at bedtime.   . Cholecalciferol (VITAMIN D PO) Take 5,000 Units by mouth daily.   . Insulin Syringe-Needle U-100 (B-D INS SYR ULTRAFINE 1CC/30G) 30G X 1/2" 1 ML MISC USE DAILY AS NEEDED, 1-3 TIMES  . LANTUS 100 UNIT/ML injection Inject 70 units SQ at bedtime  . Magnesium 200 MG TABS Take 200 mg by mouth 2 (two) times daily.  . Methyl Salicylate (WINTERGREEN OIL) OIL Take 5 application by mouth. Takes 5 gtts daily as a blood thinner that does not affect kidneys   . Multiple Vitamins-Minerals (MULTIVITAMIN PO) Take 2 tablets 2 (two) times daily by mouth. Vitality Pack Supplement  . NON FORMULARY as needed (fragrant oils used via  inhaling and diffuser).  Marland Kitchen OVER THE COUNTER MEDICATION Take 2 tablets 2 (two) times daily by mouth. Cellular Repair Vitality Pack Supplement  . OVER THE COUNTER MEDICATION Take 4 capsules by mouth 2 (two) times daily. Transfer Factor (Kidney, Glucose, Regular generic)  . peppermint oil liquid Apply 1 application topically as needed (uses on skin).   Marland Kitchen sevelamer (RENAGEL) 800 MG tablet Take by mouth.   . [DISCONTINUED] Flavoring Agent (LEMON FLAVOR) OIL Take 8 drops by mouth daily.     Past Medical History:  Diagnosis Date  . Anemia    vitamin d3 deficiency  . Anxiety   . Chronic kidney disease    End Stage Renal Disease  . Diabetes mellitus without complication ([place])   . GERD (gastroesophageal reflux disease)    nothing over last few years  . History of kidney stones 2000  . Hypertension   . Neuromuscular disorder (HCC)    neuropathy in feet  . PONV (postoperative nausea and vomiting)    severe nausea requiring many doses of post op antiemetics  . Stroke ([place]) 10/2017   thinks she had a series of mini strokes.right leg up to right side of face were numb. no loss of consciousness    Past Surgical History:  Procedure Laterality Date  . ABDOMINAL HYSTERECTOMY  2007  . AV FISTULA INSERTION W/ RF MAGNETIC GUIDANCE N/A 12/08/2017   Procedure: AV FISTULA INSERTION W/RF MAGNETIC GUIDANCE;  Surgeon: [name]  G, MD;  Location: [place] [facility];  Service: Cardiovascular;  Laterality: N/A;  . ROTATOR CUFF REPAIR Right 2004  . SHOULDER CLOSED REDUCTION Right 2004  . UPPER EXTREMITY VENOGRAPHY Left 02/15/2018   Procedure: UPPER EXTREMITY VENOGRAPHY;  Surgeon: Katha Cabal, MD;  Location: [place] [facility];  Service: Cardiovascular;  Laterality: Left;    Social History Social History   Tobacco Use  . Smoking status: Never Smoker  . Smokeless tobacco: Never Used  Substance Use Topics  . Alcohol use: No  . Drug use: Never    Family History Family History  Problem  Relation Age of Onset  . Emphysema Mother   . COPD Mother   . Cancer Father   . Cancer Paternal Aunt   . Diabetes Sister   . Hypertension Sister   . Eczema Sister   . Diabetes Brother   . Hypertension Brother   . Cardiomyopathy Brother   . Alcohol abuse Brother     Allergies  Allergen Reactions  . Enalapril Hives and Other (See Comments)    Angioedema face.  Samuel Germany Dye [Iodinated Diagnostic Agents] Hives  . Lisinopril Shortness Of Breath  . Shellfish Allergy Swelling and Other (See Comments)    patient tolerates shellfish by mouth without problem     REVIEW OF SYSTEMS (Negative unless checked)  Constitutional: [] Weight loss  [] Fever  [] Chills Cardiac: [] Chest pain   [] Chest pressure   [] Palpitations   [] Shortness of breath when laying flat   [] Shortness of breath with exertion. Vascular:  [] Pain in legs with walking   [] Pain in legs at rest  [] History of DVT   [] Phlebitis   [] Swelling in legs   [] Varicose veins   [] Non-healing ulcers Pulmonary:   [] Uses home oxygen   [] Productive cough   [] Hemoptysis   [] Wheeze  [] COPD   [] Asthma Neurologic:  [] Dizziness   [] Seizures   [] History of stroke   [] History of TIA  [] Aphasia   [] Vissual changes   [] Weakness or numbness in arm   [] Weakness or numbness in leg Musculoskeletal:   [] Joint swelling   [] Joint pain   [] Low back pain Hematologic:  [] Easy bruising  [] Easy bleeding   [] Hypercoagulable state   [] Anemic Gastrointestinal:  [] Diarrhea   [] Vomiting  [] Gastroesophageal reflux/heartburn   [] Difficulty swallowing. Genitourinary:  [x] Chronic kidney disease   [] Difficult urination  [] Frequent urination   [] Blood in urine Skin:  [] Rashes   [] Ulcers  Psychological:  [] History of anxiety   []  History of major depression.  Physical Examination  Vitals:   02/28/18 1537  BP: (!) 194/86  Pulse: 92  Resp: 18  Weight: 182 lb 9.6 oz (82.8 kg)  Height: 5\' 7"  (1.702 m)   Body mass index is 28.6 kg/m. Gen: WD/WN, NAD Head: /AT, No  temporalis wasting.  Ear/Nose/Throat: Hearing grossly intact, nares w/o erythema or drainage Eyes: PER, EOMI, sclera nonicteric.  Neck: Supple, no large masses.   Pulmonary:  Good air movement, no audible wheezing bilaterally, no use of accessory muscles.  Cardiac: RRR, no JVD Vascular: left arm fistula weak thrill and soft bruit Vessel Right Left  Radial Palpable Palpable  Ulnar Palpable Palpable  Brachial Palpable Palpable  Gastrointestinal: Non-distended. No guarding/no peritoneal signs.  Musculoskeletal: M/S 5/5 throughout.  No deformity or atrophy.  Neurologic: CN 2-12 intact. Symmetrical.  Speech is fluent. Motor exam as listed above. Psychiatric: Judgment intact, Mood & affect appropriate for pt's clinical situation. Dermatologic: No rashes or ulcers noted.  No changes consistent  with cellulitis. Lymph : No lichenification or skin changes of chronic lymphedema.  CBC Lab Results  Component Value Date   WBC 6.9 12/06/2017   HGB 9.5 (L) 12/06/2017   HCT 27.6 (L) 12/06/2017   MCV 95.2 12/06/2017   PLT 341 12/06/2017    BMET    Component Value Date/Time   NA 136 12/06/2017 1505   K 4.4 12/06/2017 1505   CL 95 (L) 12/06/2017 1505   CO2 26 12/06/2017 1505   GLUCOSE 287 (H) 12/06/2017 1505   BUN 45 (H) 12/06/2017 1505   CREATININE 6.56 (H) 12/06/2017 1505   CALCIUM 9.4 12/06/2017 1505   GFRNONAA 7 (L) 12/06/2017 1505   GFRAA 8 (L) 12/06/2017 1505   CrCl cannot be calculated (Patient's most recent lab result is older than the maximum 21 days allowed.).  COAG Lab Results  Component Value Date   INR 0.86 12/06/2017   INR 0.85 07/13/2017    Radiology No results found.    Assessment/Plan 1. Stage 5 chronic renal impairment associated with type 2 diabetes mellitus (HCC) Recommend:  The patient's fistula is not maturing and I have discussed further surgical intervention with her.  I have reviewed the option of arteriogram to assess the anastomosis as well as the  possible need to superficialize the fistula   The risks, benefits and alternative therapies were reviewed in detail with the patient.  All questions were answered.  The patient will consider this but does not agree at this time   2. Uncontrolled hypertension Continue antihypertensive medications as already ordered, these medications have been reviewed and there are no changes at this time.   3. Type 2 diabetes mellitus with stage 5 chronic kidney disease and hypertension ([place]) Continue hypoglycemic medications as already ordered, these medications have been reviewed and there are no changes at this time.  Hgb A1C to be monitored as already arranged by primary service     [name], MD  03/12/2018 2:39 PM

## 2018-04-04 ENCOUNTER — Ambulatory Visit (INDEPENDENT_AMBULATORY_CARE_PROVIDER_SITE_OTHER): Payer: BLUE CROSS/BLUE SHIELD | Admitting: Vascular Surgery

## 2018-04-04 DIAGNOSIS — D649 Anemia, unspecified: Secondary | ICD-10-CM | POA: Diagnosis not present

## 2018-04-04 DIAGNOSIS — N185 Chronic kidney disease, stage 5: Secondary | ICD-10-CM | POA: Diagnosis not present

## 2018-04-04 DIAGNOSIS — N2581 Secondary hyperparathyroidism of renal origin: Secondary | ICD-10-CM | POA: Diagnosis not present

## 2018-04-04 DIAGNOSIS — I10 Essential (primary) hypertension: Secondary | ICD-10-CM | POA: Diagnosis not present

## 2018-04-06 ENCOUNTER — Other Ambulatory Visit: Payer: Self-pay | Admitting: Family Medicine

## 2018-04-06 DIAGNOSIS — N185 Chronic kidney disease, stage 5: Principal | ICD-10-CM

## 2018-04-06 DIAGNOSIS — E1122 Type 2 diabetes mellitus with diabetic chronic kidney disease: Secondary | ICD-10-CM

## 2018-04-06 DIAGNOSIS — I120 Hypertensive chronic kidney disease with stage 5 chronic kidney disease or end stage renal disease: Principal | ICD-10-CM

## 2018-04-12 DIAGNOSIS — N185 Chronic kidney disease, stage 5: Secondary | ICD-10-CM | POA: Diagnosis not present

## 2018-04-12 DIAGNOSIS — D631 Anemia in chronic kidney disease: Secondary | ICD-10-CM | POA: Diagnosis not present

## 2018-04-13 ENCOUNTER — Other Ambulatory Visit: Payer: Self-pay

## 2018-04-13 DIAGNOSIS — T82510A Breakdown (mechanical) of surgically created arteriovenous fistula, initial encounter: Secondary | ICD-10-CM

## 2018-04-20 ENCOUNTER — Encounter: Payer: Self-pay | Admitting: Family Medicine

## 2018-04-20 ENCOUNTER — Ambulatory Visit: Payer: BLUE CROSS/BLUE SHIELD | Admitting: Family Medicine

## 2018-04-20 VITALS — BP 140/70 | HR 100 | Temp 98.7°F | Resp 16 | Ht 66.0 in | Wt 179.8 lb

## 2018-04-20 DIAGNOSIS — I120 Hypertensive chronic kidney disease with stage 5 chronic kidney disease or end stage renal disease: Secondary | ICD-10-CM

## 2018-04-20 DIAGNOSIS — N185 Chronic kidney disease, stage 5: Secondary | ICD-10-CM

## 2018-04-20 DIAGNOSIS — E1169 Type 2 diabetes mellitus with other specified complication: Secondary | ICD-10-CM | POA: Diagnosis not present

## 2018-04-20 DIAGNOSIS — E1122 Type 2 diabetes mellitus with diabetic chronic kidney disease: Secondary | ICD-10-CM

## 2018-04-20 DIAGNOSIS — Z79899 Other long term (current) drug therapy: Secondary | ICD-10-CM

## 2018-04-20 DIAGNOSIS — E1342 Other specified diabetes mellitus with diabetic polyneuropathy: Secondary | ICD-10-CM | POA: Diagnosis not present

## 2018-04-20 DIAGNOSIS — G629 Polyneuropathy, unspecified: Secondary | ICD-10-CM

## 2018-04-20 DIAGNOSIS — N2581 Secondary hyperparathyroidism of renal origin: Secondary | ICD-10-CM

## 2018-04-20 DIAGNOSIS — E785 Hyperlipidemia, unspecified: Secondary | ICD-10-CM

## 2018-04-20 MED ORDER — INSULIN DEGLUDEC 200 UNIT/ML ~~LOC~~ SOPN: 70.0000 [IU] | PEN_INJECTOR | Freq: Every day | SUBCUTANEOUS | 2 refills | Status: DC

## 2018-04-20 MED ORDER — LIDOCAINE 5 % EX PTCH: 2.0000 | MEDICATED_PATCH | CUTANEOUS | 0 refills | Status: DC

## 2018-04-20 MED ORDER — INSULIN PEN NEEDLE 32G X 6 MM MISC: 1.0000 | Freq: Three times a day (TID) | 1 refills | Status: DC

## 2018-04-20 NOTE — Progress Notes (Signed)
Name: [name]   MRN: [id number]    DOB: Dec 19, 1965   Date:04/20/2018       Progress Note  Subjective  Chief Complaint  Chief Complaint  Patient presents with  . Follow-up    1 mth f/u  . Diabetes  . Dyslipidemia    HPI   DMII: diagnosed years ago, type II diabetes but started insulin therapy in 2008. Her hgbA1C has not been at goal. She has multiple complications of diabetes, including hypertension, CKI stage V, dyslipidemia. She has been getting lantus from nephrologist and was on vials, she prefers the pen device and we will switch to [place] from Lantus. HgbA1C in July was 10. She is now on pre-meal insulin, starting [place] today in place of Lantus and on Ozempic which has curbed her appetite and she has noticed glucose usually below 200 now. She checks prior meals only, advised to check after dinner and it should be below 180. No polyphagia, polydipsia or polyuria. She has neuropathy worse on right leg and would like to use topical medication for pain we will try lidoderm patches.   Dyslipidemia: reviewed records from [facility], she refuses statin therapy, but explained that if triglycerides is very high we may start a rx fish oil. She believes in essential oils. She is willing to check labs today  HTN: bp is slightly better , we will continue current regiment    Patient Active Problem List   Diagnosis Date Noted  . Secondary hyperparathyroidism of renal origin ([place]) 03/07/2018  . Foot ulcer due to secondary DM ([place]) 03/07/2018  . Type 2 diabetes mellitus with stage 5 chronic kidney disease and hypertension ([place]) 03/07/2018  . Stage 5 chronic renal impairment associated with type 2 diabetes mellitus ([place]) 07/26/2017  . Uncontrolled hypertension 07/26/2017  . Hyperphosphatemia 09/29/2016  . Metabolic acidosis 85/46/2703  . Hyperlipidemia 08/08/2015    Past Surgical History:  Procedure Laterality Date  . ABDOMINAL HYSTERECTOMY  2007  . AV FISTULA INSERTION W/ RF MAGNETIC  GUIDANCE N/A 12/08/2017   Procedure: AV FISTULA INSERTION W/RF MAGNETIC GUIDANCE;  Surgeon: Katha Cabal, MD;  Location: [facility];  Service: Cardiovascular;  Laterality: N/A;  . ROTATOR CUFF REPAIR Right 2004  . SHOULDER CLOSED REDUCTION Right 2004  . UPPER EXTREMITY VENOGRAPHY Left 02/15/2018   Procedure: UPPER EXTREMITY VENOGRAPHY;  Surgeon: Katha Cabal, MD;  Location: [facility];  Service: Cardiovascular;  Laterality: Left;    Family History  Problem Relation Age of Onset  . Emphysema Mother   . COPD Mother   . Cancer Father   . Cancer Paternal Aunt   . Diabetes Sister   . Hypertension Sister   . Eczema Sister   . Diabetes Brother   . Hypertension Brother   . Cardiomyopathy Brother   . Alcohol abuse Brother     Social History   Socioeconomic History  . Marital status: Married    Spouse name: Aaron Edelman  . Number of children: 0  . Years of education: Not on file  . Highest education level: Some college, no degree  Occupational History  . Occupation: Clinical cytogeneticist  Social Needs  . Financial resource strain: Not hard at all  . Food insecurity:    Worry: Never true    Inability: Never true  . Transportation needs:    Medical: No    Non-medical: No  Tobacco Use  . Smoking status: Never Smoker  . Smokeless tobacco: Never Used  Substance and Sexual  Activity  . Alcohol use: No  . Drug use: Never  . Sexual activity: Yes    Partners: Male  Lifestyle  . Physical activity:    Days per week: 7 days    Minutes per session: 30 min  . Stress: Not at all  Relationships  . Social connections:    Talks on phone: More than three times a week    Gets together: More than three times a week    Attends religious service: More than 4 times per year    Active member of club or organization: No    Attends meetings of clubs or organizations: Never    Relationship status: Married  . Intimate partner violence:    Fear of current or ex  partner: No    Emotionally abused: No    Physically abused: No    Forced sexual activity: No  Other Topics Concern  . Not on file  Social History Narrative  . Not on file     Current Outpatient Medications:  .  Amino Acids (AMINO ACID PO), Take 2 tablets by mouth 2 (two) times daily. Vitality Pack Supplement Amino Acid, Disp: , Rfl:  .  amLODipine (NORVASC) 5 MG tablet, Take 5 mg by mouth 2 (two) times daily. , Disp: , Rfl:  .  Cananga Oil Fragrance (YLANG-YLANG OIL FRAGRANCE) OIL, Take 6 drops by mouth at bedtime. , Disp: , Rfl:  .  Cholecalciferol (VITAMIN D PO), Take 5,000 Units by mouth daily. , Disp: , Rfl:  .  Insulin Pen Needle (NOVOFINE) 32G X 6 MM MISC, 1 each by Does not apply route 2 (two) times daily., Disp: 200 each, Rfl: 1 .  Insulin Syringe-Needle U-100 (B-D INS SYR ULTRAFINE 1CC/30G) 30G X 1/2" 1 ML MISC, USE DAILY AS NEEDED, 1-3 TIMES, Disp: , Rfl:  .  LANTUS 100 UNIT/ML injection, Inject 70 units SQ at bedtime, Disp: , Rfl: 2 .  Lemon Oil OIL, 8 drops by Does not apply route., Disp: , Rfl:  .  Magnesium 200 MG TABS, Take 200 mg by mouth 2 (two) times daily., Disp: , Rfl:  .  Methyl Salicylate (WINTERGREEN OIL) OIL, Take 5 application by mouth. Takes 5 gtts daily as a blood thinner that does not affect kidneys , Disp: , Rfl:  .  Multiple Vitamins-Minerals (MULTIVITAMIN PO), Take 2 tablets 2 (two) times daily by mouth. Vitality Pack Supplement, Disp: , Rfl:  .  NON FORMULARY, as needed (fragrant oils used via inhaling and diffuser)., Disp: , Rfl:  .  NOVOLOG FLEXPEN 100 UNIT/ML FlexPen, INJECT 5-15 UNITS INTO THE SKIN DAILY BEFORE SUPPER., Disp: 15 mL, Rfl: 0 .  OVER THE COUNTER MEDICATION, Take 2 tablets 2 (two) times daily by mouth. Cellular Repair Vitality Pack Supplement, Disp: , Rfl:  .  OVER THE COUNTER MEDICATION, Take 4 capsules by mouth 2 (two) times daily. Transfer Factor (Kidney, Glucose, Regular generic), Disp: , Rfl:  .  peppermint oil liquid, Apply 1  application topically as needed (uses on skin). , Disp: , Rfl:  .  Semaglutide (OZEMPIC) 1 MG/DOSE SOPN, Inject 1 mg into the skin once a week., Disp: 9 mL, Rfl: 1  Allergies  Allergen Reactions  . Enalapril Hives and Other (See Comments)    Angioedema face.  Samuel Germany Dye [Iodinated Diagnostic Agents] Hives  . Lisinopril Shortness Of Breath  . Shellfish Allergy Swelling and Other (See Comments)    patient tolerates shellfish by mouth without problem  ROS  Constitutional: Negative for fever or weight change.  Respiratory: Negative for cough and shortness of breath.   Cardiovascular: Negative for chest pain or palpitations.  Gastrointestinal: Negative for abdominal pain, no bowel changes.  Musculoskeletal: positive for gait problem - secondary to neuropathy, but no  joint swelling.  Skin: Negative for rash.  Neurological: Negative for dizziness or headache.  No other specific complaints in a complete review of systems (except as listed in HPI above).  Objective  Vitals:   04/20/18 1436  BP: 140/70  Pulse: 100  Resp: 16  Temp: 98.7 F (37.1 C)  TempSrc: Oral  SpO2: 93%  Weight: 179 lb 12.8 oz (81.6 kg)  Height: 5\' 6"  (1.676 m)    Body mass index is 29.02 kg/m.  Physical Exam  Constitutional: Patient appears well-developed and well-nourished. Overweight.  No distress.  HEENT: head atraumatic, normocephalic, pupils equal and reactive to light,  neck supple, throat within normal limits Cardiovascular: Normal rate, regular rhythm and normal heart sounds.  No murmur heard. No BLE edema. Pulmonary/Chest: Effort normal and breath sounds normal. No respiratory distress. Abdominal: Soft.  There is no tenderness. Psychiatric: Patient has a normal mood and affect. behavior is normal. Judgment and thought content normal.  Recent Results (from the past 2160 hour(s))  Potassium [facility] vascular lab only)     Status: None   Collection Time: 02/15/18  9:04 AM  Result Value Ref Range    Potassium [facility] vascular lab) 4.1 3.5 - 5.1    Comment: Performed at [facility], [address] [place], [place] [address]  Glucose, capillary     Status: Abnormal   Collection Time: 02/15/18  9:22 AM  Result Value Ref Range   Glucose-Capillary 246 (H) 65 - 99 mg/dL  POCT HgB A1C     Status: Abnormal   Collection Time: 03/07/18  3:54 PM  Result Value Ref Range   Hemoglobin A1C 10.0 (A) 4.0 - 5.6 %   HbA1c POC (<> result, manual entry)  4.0 - 5.6 %   HbA1c, POC (prediabetic range)  5.7 - 6.4 %   HbA1c, POC (controlled diabetic range)  0.0 - 7.0 %      PHQ2/9: Depression screen [facility] 2/9 04/20/2018 03/07/2018  Decreased Interest 0 0  Down, Depressed, Hopeless 0 0  PHQ - 2 Score 0 0  Altered sleeping 3 0  Tired, decreased energy 0 0  Change in appetite 0 0  Feeling bad or failure about yourself  0 0  Trouble concentrating 0 0  Moving slowly or fidgety/restless 0 0  Suicidal thoughts 0 0  PHQ-9 Score 3 0    Fall Risk: Fall Risk  04/20/2018 03/07/2018  Falls in the past year? No No    Functional Status Survey: Is the patient deaf or have difficulty hearing?: No Does the patient have difficulty seeing, even when wearing glasses/contacts?: No Does the patient have difficulty concentrating, remembering, or making decisions?: No Does the patient have difficulty walking or climbing stairs?: Yes Does the patient have difficulty dressing or bathing?: No Does the patient have difficulty doing errands alone such as visiting a doctor's office or shopping?: No    Assessment & Plan  1. 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  Dispense: 200 each; Refill: 1 - Insulin Degludec (TRESIBA FLEXTOUCH) 200  UNIT/ML SOPN; Inject 70 Units into the skin daily.  Dispense: 15 mL; Refill: 2  2. Diabetic polyneuropathy associated  with other specified diabetes mellitus ([place])  - lidocaine (LIDODERM) 5 %; Place 2 patches onto the skin daily. Remove & Discard patch within 12 hours or as directed by MD  Dispense: 60 patch; Refill: 0 - Insulin Pen Needle (NOVOFINE) 32G X 6 MM MISC; 1 each by Does not apply route 3 (three) times daily with meals.  Dispense: 200 each; Refill: 1 - Insulin Degludec (TRESIBA FLEXTOUCH) 200 UNIT/ML SOPN; Inject 70 Units into the skin daily.  Dispense: 15 mL; Refill: 2  3. Chronic kidney disease, stage V (very severe) ([place])  Still seeing Dr. Johnney Ou  4. Dyslipidemia associated with type 2 diabetes mellitus (HCC)  - Insulin Pen Needle (NOVOFINE) 32G X 6 MM MISC; 1 each by Does not apply route 3 (three) times daily with meals.  Dispense: 200 each; Refill: 1 - Insulin Degludec (TRESIBA FLEXTOUCH) 200 UNIT/ML SOPN; Inject 70 Units into the skin daily.  Dispense: 15 mL; Refill: 2  5. Secondary hyperparathyroidism of renal origin [facility])  Still high , reviewed labs done by Dr. Johnney Ou   6. Neuropathy  - B12 and Folate Panel  7. Long-term use of high-risk medication  - Hepatic function panel

## 2018-04-20 NOTE — Patient Instructions (Signed)
Samples Ozempic Changing from Lantus to [place] start at 60 units and monitor before going up to 70 units Return for labs - only checking liver enzymes, b12, folate and lipid panel

## 2018-04-25 DIAGNOSIS — E785 Hyperlipidemia, unspecified: Secondary | ICD-10-CM | POA: Diagnosis not present

## 2018-04-25 DIAGNOSIS — E1169 Type 2 diabetes mellitus with other specified complication: Secondary | ICD-10-CM | POA: Diagnosis not present

## 2018-04-25 DIAGNOSIS — G629 Polyneuropathy, unspecified: Secondary | ICD-10-CM | POA: Diagnosis not present

## 2018-04-25 DIAGNOSIS — Z79899 Other long term (current) drug therapy: Secondary | ICD-10-CM | POA: Diagnosis not present

## 2018-04-26 ENCOUNTER — Encounter: Payer: Self-pay | Admitting: Family Medicine

## 2018-04-26 LAB — LIPID PANEL
CHOL/HDL RATIO: 6.2 (calc) — AB (ref <5.0)
CHOLESTEROL: 228 mg/dL — AB (ref <200)
HDL: 37 mg/dL — ABNORMAL LOW (ref >50)
Non-HDL Cholesterol (Calc): 191 mg/dL (calc) — ABNORMAL HIGH (ref <130)
Triglycerides: 432 mg/dL — ABNORMAL HIGH (ref <150)

## 2018-04-26 LAB — HEPATIC FUNCTION PANEL
AG RATIO: 1.4 (calc) (ref 1.0–2.5)
ALKALINE PHOSPHATASE (APISO): 75 U/L (ref 33–130)
ALT: 15 U/L (ref 6–29)
AST: 12 U/L (ref 10–35)
Albumin: 4.2 g/dL (ref 3.6–5.1)
BILIRUBIN DIRECT: 0 mg/dL (ref 0.0–0.2)
BILIRUBIN TOTAL: 0.3 mg/dL (ref 0.2–1.2)
Globulin: 2.9 g/dL (calc) (ref 1.9–3.7)
Indirect Bilirubin: 0.3 mg/dL (calc) (ref 0.2–1.2)
Total Protein: 7.1 g/dL (ref 6.1–8.1)

## 2018-04-26 LAB — B12 AND FOLATE PANEL
Folate: >24 ng/mL
Vitamin B-12: >2000 pg/mL — ABNORMAL HIGH (ref 200–1100)

## 2018-05-12 ENCOUNTER — Other Ambulatory Visit: Payer: Self-pay

## 2018-05-12 DIAGNOSIS — E1122 Type 2 diabetes mellitus with diabetic chronic kidney disease: Secondary | ICD-10-CM

## 2018-05-12 DIAGNOSIS — N185 Chronic kidney disease, stage 5: Principal | ICD-10-CM

## 2018-05-12 DIAGNOSIS — I120 Hypertensive chronic kidney disease with stage 5 chronic kidney disease or end stage renal disease: Principal | ICD-10-CM

## 2018-05-12 MED ORDER — INSULIN ASPART 100 UNIT/ML FLEXPEN: PEN_INJECTOR | SUBCUTANEOUS | 0 refills | Status: DC

## 2018-05-12 NOTE — Telephone Encounter (Signed)
Request for diabetes medication. Novolog  Last office visit pertaining to diabetes: 04/20/2018   Lab Results  Component Value Date   HGBA1C 10.0 (A) 03/07/2018    Follow up on 06/20/2018

## 2018-05-23 DIAGNOSIS — Z992 Dependence on renal dialysis: Secondary | ICD-10-CM | POA: Diagnosis not present

## 2018-05-23 DIAGNOSIS — N186 End stage renal disease: Secondary | ICD-10-CM | POA: Diagnosis not present

## 2018-05-24 ENCOUNTER — Encounter (HOSPITAL_COMMUNITY): Payer: BLUE CROSS/BLUE SHIELD

## 2018-05-24 ENCOUNTER — Encounter: Payer: BLUE CROSS/BLUE SHIELD | Admitting: Vascular Surgery

## 2018-05-24 DIAGNOSIS — N185 Chronic kidney disease, stage 5: Secondary | ICD-10-CM | POA: Diagnosis not present

## 2018-05-27 DIAGNOSIS — N186 End stage renal disease: Secondary | ICD-10-CM | POA: Diagnosis not present

## 2018-05-27 DIAGNOSIS — T8249XA Other complication of vascular dialysis catheter, initial encounter: Secondary | ICD-10-CM | POA: Diagnosis not present

## 2018-05-27 DIAGNOSIS — N2581 Secondary hyperparathyroidism of renal origin: Secondary | ICD-10-CM | POA: Diagnosis not present

## 2018-05-30 DIAGNOSIS — T8249XA Other complication of vascular dialysis catheter, initial encounter: Secondary | ICD-10-CM | POA: Diagnosis not present

## 2018-05-30 DIAGNOSIS — N2581 Secondary hyperparathyroidism of renal origin: Secondary | ICD-10-CM | POA: Diagnosis not present

## 2018-05-30 DIAGNOSIS — N186 End stage renal disease: Secondary | ICD-10-CM | POA: Diagnosis not present

## 2018-06-01 DIAGNOSIS — T8249XA Other complication of vascular dialysis catheter, initial encounter: Secondary | ICD-10-CM | POA: Diagnosis not present

## 2018-06-01 DIAGNOSIS — N186 End stage renal disease: Secondary | ICD-10-CM | POA: Diagnosis not present

## 2018-06-01 DIAGNOSIS — N2581 Secondary hyperparathyroidism of renal origin: Secondary | ICD-10-CM | POA: Diagnosis not present

## 2018-06-03 DIAGNOSIS — N186 End stage renal disease: Secondary | ICD-10-CM | POA: Diagnosis not present

## 2018-06-03 DIAGNOSIS — T8249XA Other complication of vascular dialysis catheter, initial encounter: Secondary | ICD-10-CM | POA: Diagnosis not present

## 2018-06-03 DIAGNOSIS — N2581 Secondary hyperparathyroidism of renal origin: Secondary | ICD-10-CM | POA: Diagnosis not present

## 2018-06-06 DIAGNOSIS — T8249XA Other complication of vascular dialysis catheter, initial encounter: Secondary | ICD-10-CM | POA: Diagnosis not present

## 2018-06-06 DIAGNOSIS — N2581 Secondary hyperparathyroidism of renal origin: Secondary | ICD-10-CM | POA: Diagnosis not present

## 2018-06-06 DIAGNOSIS — N186 End stage renal disease: Secondary | ICD-10-CM | POA: Diagnosis not present

## 2018-06-08 ENCOUNTER — Encounter: Payer: Self-pay | Admitting: Family Medicine

## 2018-06-08 DIAGNOSIS — N2581 Secondary hyperparathyroidism of renal origin: Secondary | ICD-10-CM | POA: Diagnosis not present

## 2018-06-08 DIAGNOSIS — N186 End stage renal disease: Secondary | ICD-10-CM | POA: Diagnosis not present

## 2018-06-08 DIAGNOSIS — T8249XA Other complication of vascular dialysis catheter, initial encounter: Secondary | ICD-10-CM | POA: Diagnosis not present

## 2018-06-09 ENCOUNTER — Encounter: Payer: Self-pay | Admitting: Family Medicine

## 2018-06-09 ENCOUNTER — Other Ambulatory Visit: Payer: Self-pay | Admitting: Family Medicine

## 2018-06-09 ENCOUNTER — Other Ambulatory Visit: Payer: Self-pay

## 2018-06-09 MED ORDER — ONETOUCH ULTRASOFT LANCETS MISC: 2 refills | Status: DC

## 2018-06-09 MED ORDER — GLUCOSE BLOOD VI STRP: ORAL_STRIP | 2 refills | Status: DC

## 2018-06-09 MED ORDER — BLOOD GLUCOSE METER KIT: PACK | 0 refills | Status: DC

## 2018-06-10 DIAGNOSIS — T8249XA Other complication of vascular dialysis catheter, initial encounter: Secondary | ICD-10-CM | POA: Diagnosis not present

## 2018-06-10 DIAGNOSIS — N186 End stage renal disease: Secondary | ICD-10-CM | POA: Diagnosis not present

## 2018-06-10 DIAGNOSIS — N2581 Secondary hyperparathyroidism of renal origin: Secondary | ICD-10-CM | POA: Diagnosis not present

## 2018-06-10 NOTE — Telephone Encounter (Signed)
[facility] called [facility] today and they claim they do not have the test strips. Can that be faxed today? Patient is going out of town tomorrow and would like to pick those up today, thanks!

## 2018-06-11 ENCOUNTER — Other Ambulatory Visit: Payer: Self-pay | Admitting: Family Medicine

## 2018-06-11 DIAGNOSIS — E1122 Type 2 diabetes mellitus with diabetic chronic kidney disease: Secondary | ICD-10-CM

## 2018-06-11 DIAGNOSIS — I120 Hypertensive chronic kidney disease with stage 5 chronic kidney disease or end stage renal disease: Principal | ICD-10-CM

## 2018-06-11 DIAGNOSIS — N185 Chronic kidney disease, stage 5: Principal | ICD-10-CM

## 2018-06-13 ENCOUNTER — Other Ambulatory Visit: Payer: Self-pay

## 2018-06-13 NOTE — Telephone Encounter (Signed)
Completed on 06/09/18

## 2018-06-15 NOTE — Progress Notes (Unsigned)
Pt checking status on her glucose test strips being sent to  [id number] - [place], [place] [place] [phone] (Phone) [phone] (Fax)   She states she has been guessing on how much insulin she should take.

## 2018-06-16 ENCOUNTER — Other Ambulatory Visit: Payer: Self-pay

## 2018-06-16 DIAGNOSIS — N185 Chronic kidney disease, stage 5: Principal | ICD-10-CM

## 2018-06-16 DIAGNOSIS — E1122 Type 2 diabetes mellitus with diabetic chronic kidney disease: Secondary | ICD-10-CM

## 2018-06-16 DIAGNOSIS — I120 Hypertensive chronic kidney disease with stage 5 chronic kidney disease or end stage renal disease: Principal | ICD-10-CM

## 2018-06-16 MED ORDER — GLUCOSE BLOOD VI STRP: ORAL_STRIP | 2 refills | Status: DC

## 2018-06-16 NOTE — Telephone Encounter (Signed)
Refill request was sent to Dr. Krichna Sowles for approval and submission.  

## 2018-06-16 NOTE — Telephone Encounter (Signed)
Patient is requesting a call back when message is sent up

## 2018-06-20 ENCOUNTER — Ambulatory Visit: Payer: BLUE CROSS/BLUE SHIELD | Admitting: Family Medicine

## 2018-06-20 DIAGNOSIS — Z992 Dependence on renal dialysis: Secondary | ICD-10-CM | POA: Diagnosis not present

## 2018-06-20 DIAGNOSIS — N186 End stage renal disease: Secondary | ICD-10-CM | POA: Diagnosis not present

## 2018-06-22 DIAGNOSIS — N186 End stage renal disease: Secondary | ICD-10-CM | POA: Diagnosis not present

## 2018-06-22 DIAGNOSIS — Z992 Dependence on renal dialysis: Secondary | ICD-10-CM | POA: Diagnosis not present

## 2018-06-24 ENCOUNTER — Other Ambulatory Visit
Admission: RE | Admit: 2018-06-24 | Discharge: 2018-06-24 | Disposition: A | Discharge status: Home / Self Care | Payer: BLUE CROSS/BLUE SHIELD | Source: Ambulatory Visit | Attending: Nephrology | Admitting: Nephrology

## 2018-06-24 DIAGNOSIS — N186 End stage renal disease: Secondary | ICD-10-CM | POA: Diagnosis not present

## 2018-06-24 DIAGNOSIS — Z992 Dependence on renal dialysis: Secondary | ICD-10-CM | POA: Diagnosis not present

## 2018-06-24 LAB — POTASSIUM: Potassium: 4.8 mmol/L (ref 3.5–5.1)

## 2018-06-27 DIAGNOSIS — N186 End stage renal disease: Secondary | ICD-10-CM | POA: Diagnosis not present

## 2018-06-27 DIAGNOSIS — Z992 Dependence on renal dialysis: Secondary | ICD-10-CM | POA: Diagnosis not present

## 2018-06-29 DIAGNOSIS — Z992 Dependence on renal dialysis: Secondary | ICD-10-CM | POA: Diagnosis not present

## 2018-06-29 DIAGNOSIS — N186 End stage renal disease: Secondary | ICD-10-CM | POA: Diagnosis not present

## 2018-06-30 DIAGNOSIS — I129 Hypertensive chronic kidney disease with stage 1 through stage 4 chronic kidney disease, or unspecified chronic kidney disease: Secondary | ICD-10-CM | POA: Diagnosis not present

## 2018-06-30 DIAGNOSIS — N186 End stage renal disease: Secondary | ICD-10-CM | POA: Diagnosis not present

## 2018-06-30 DIAGNOSIS — Z992 Dependence on renal dialysis: Secondary | ICD-10-CM | POA: Diagnosis not present

## 2018-07-01 DIAGNOSIS — N186 End stage renal disease: Secondary | ICD-10-CM | POA: Diagnosis not present

## 2018-07-01 DIAGNOSIS — Z992 Dependence on renal dialysis: Secondary | ICD-10-CM | POA: Diagnosis not present

## 2018-07-04 DIAGNOSIS — N186 End stage renal disease: Secondary | ICD-10-CM | POA: Diagnosis not present

## 2018-07-04 DIAGNOSIS — Z992 Dependence on renal dialysis: Secondary | ICD-10-CM | POA: Diagnosis not present

## 2018-07-06 DIAGNOSIS — N186 End stage renal disease: Secondary | ICD-10-CM | POA: Diagnosis not present

## 2018-07-06 DIAGNOSIS — Z992 Dependence on renal dialysis: Secondary | ICD-10-CM | POA: Diagnosis not present

## 2018-07-08 DIAGNOSIS — N186 End stage renal disease: Secondary | ICD-10-CM | POA: Diagnosis not present

## 2018-07-08 DIAGNOSIS — Z992 Dependence on renal dialysis: Secondary | ICD-10-CM | POA: Diagnosis not present

## 2018-07-11 DIAGNOSIS — Z992 Dependence on renal dialysis: Secondary | ICD-10-CM | POA: Diagnosis not present

## 2018-07-11 DIAGNOSIS — N186 End stage renal disease: Secondary | ICD-10-CM | POA: Diagnosis not present

## 2018-07-13 ENCOUNTER — Other Ambulatory Visit: Payer: Self-pay | Admitting: Family Medicine

## 2018-07-13 DIAGNOSIS — E1122 Type 2 diabetes mellitus with diabetic chronic kidney disease: Secondary | ICD-10-CM

## 2018-07-13 DIAGNOSIS — N185 Chronic kidney disease, stage 5: Principal | ICD-10-CM

## 2018-07-13 DIAGNOSIS — Z992 Dependence on renal dialysis: Secondary | ICD-10-CM | POA: Diagnosis not present

## 2018-07-13 DIAGNOSIS — N186 End stage renal disease: Secondary | ICD-10-CM | POA: Diagnosis not present

## 2018-07-13 DIAGNOSIS — I120 Hypertensive chronic kidney disease with stage 5 chronic kidney disease or end stage renal disease: Principal | ICD-10-CM

## 2018-07-14 ENCOUNTER — Ambulatory Visit (INDEPENDENT_AMBULATORY_CARE_PROVIDER_SITE_OTHER): Payer: BLUE CROSS/BLUE SHIELD | Admitting: Vascular Surgery

## 2018-07-14 ENCOUNTER — Encounter (INDEPENDENT_AMBULATORY_CARE_PROVIDER_SITE_OTHER): Payer: Self-pay

## 2018-07-14 ENCOUNTER — Encounter (INDEPENDENT_AMBULATORY_CARE_PROVIDER_SITE_OTHER): Payer: BLUE CROSS/BLUE SHIELD

## 2018-07-15 DIAGNOSIS — N186 End stage renal disease: Secondary | ICD-10-CM | POA: Diagnosis not present

## 2018-07-15 DIAGNOSIS — Z992 Dependence on renal dialysis: Secondary | ICD-10-CM | POA: Diagnosis not present

## 2018-07-20 ENCOUNTER — Other Ambulatory Visit: Payer: Self-pay | Admitting: Family Medicine

## 2018-07-20 DIAGNOSIS — N185 Chronic kidney disease, stage 5: Principal | ICD-10-CM

## 2018-07-20 DIAGNOSIS — Z992 Dependence on renal dialysis: Secondary | ICD-10-CM | POA: Diagnosis not present

## 2018-07-20 DIAGNOSIS — E1122 Type 2 diabetes mellitus with diabetic chronic kidney disease: Secondary | ICD-10-CM

## 2018-07-20 DIAGNOSIS — N186 End stage renal disease: Secondary | ICD-10-CM | POA: Diagnosis not present

## 2018-07-20 DIAGNOSIS — I120 Hypertensive chronic kidney disease with stage 5 chronic kidney disease or end stage renal disease: Principal | ICD-10-CM

## 2018-07-22 DIAGNOSIS — N186 End stage renal disease: Secondary | ICD-10-CM | POA: Diagnosis not present

## 2018-07-22 DIAGNOSIS — Z992 Dependence on renal dialysis: Secondary | ICD-10-CM | POA: Diagnosis not present

## 2018-07-26 ENCOUNTER — Ambulatory Visit: Payer: BLUE CROSS/BLUE SHIELD | Admitting: Family Medicine

## 2018-07-26 DIAGNOSIS — N186 End stage renal disease: Secondary | ICD-10-CM | POA: Diagnosis not present

## 2018-07-26 DIAGNOSIS — Z992 Dependence on renal dialysis: Secondary | ICD-10-CM | POA: Diagnosis not present

## 2018-07-29 DIAGNOSIS — N186 End stage renal disease: Secondary | ICD-10-CM | POA: Diagnosis not present

## 2018-07-29 DIAGNOSIS — Z992 Dependence on renal dialysis: Secondary | ICD-10-CM | POA: Diagnosis not present

## 2018-07-30 DIAGNOSIS — N186 End stage renal disease: Secondary | ICD-10-CM | POA: Diagnosis not present

## 2018-07-30 DIAGNOSIS — Z992 Dependence on renal dialysis: Secondary | ICD-10-CM | POA: Diagnosis not present

## 2018-08-01 DIAGNOSIS — Z992 Dependence on renal dialysis: Secondary | ICD-10-CM | POA: Diagnosis not present

## 2018-08-01 DIAGNOSIS — N186 End stage renal disease: Secondary | ICD-10-CM | POA: Diagnosis not present

## 2018-08-03 DIAGNOSIS — Z992 Dependence on renal dialysis: Secondary | ICD-10-CM | POA: Diagnosis not present

## 2018-08-03 DIAGNOSIS — N186 End stage renal disease: Secondary | ICD-10-CM | POA: Diagnosis not present

## 2018-08-05 DIAGNOSIS — N186 End stage renal disease: Secondary | ICD-10-CM | POA: Diagnosis not present

## 2018-08-05 DIAGNOSIS — Z992 Dependence on renal dialysis: Secondary | ICD-10-CM | POA: Diagnosis not present

## 2018-08-08 DIAGNOSIS — Z992 Dependence on renal dialysis: Secondary | ICD-10-CM | POA: Diagnosis not present

## 2018-08-08 DIAGNOSIS — N186 End stage renal disease: Secondary | ICD-10-CM | POA: Diagnosis not present

## 2018-08-10 DIAGNOSIS — Z992 Dependence on renal dialysis: Secondary | ICD-10-CM | POA: Diagnosis not present

## 2018-08-10 DIAGNOSIS — N186 End stage renal disease: Secondary | ICD-10-CM | POA: Diagnosis not present

## 2018-08-12 DIAGNOSIS — N186 End stage renal disease: Secondary | ICD-10-CM | POA: Diagnosis not present

## 2018-08-12 DIAGNOSIS — Z992 Dependence on renal dialysis: Secondary | ICD-10-CM | POA: Diagnosis not present

## 2018-08-15 DIAGNOSIS — Z992 Dependence on renal dialysis: Secondary | ICD-10-CM | POA: Diagnosis not present

## 2018-08-15 DIAGNOSIS — N186 End stage renal disease: Secondary | ICD-10-CM | POA: Diagnosis not present

## 2018-08-17 DIAGNOSIS — Z992 Dependence on renal dialysis: Secondary | ICD-10-CM | POA: Diagnosis not present

## 2018-08-17 DIAGNOSIS — N186 End stage renal disease: Secondary | ICD-10-CM | POA: Diagnosis not present

## 2018-08-19 DIAGNOSIS — Z992 Dependence on renal dialysis: Secondary | ICD-10-CM | POA: Diagnosis not present

## 2018-08-19 DIAGNOSIS — N186 End stage renal disease: Secondary | ICD-10-CM | POA: Diagnosis not present

## 2018-08-23 DIAGNOSIS — Z992 Dependence on renal dialysis: Secondary | ICD-10-CM | POA: Diagnosis not present

## 2018-08-23 DIAGNOSIS — N186 End stage renal disease: Secondary | ICD-10-CM | POA: Diagnosis not present

## 2018-08-26 DIAGNOSIS — Z992 Dependence on renal dialysis: Secondary | ICD-10-CM | POA: Diagnosis not present

## 2018-08-26 DIAGNOSIS — N186 End stage renal disease: Secondary | ICD-10-CM | POA: Diagnosis not present

## 2018-08-29 DIAGNOSIS — Z992 Dependence on renal dialysis: Secondary | ICD-10-CM | POA: Diagnosis not present

## 2018-08-29 DIAGNOSIS — N186 End stage renal disease: Secondary | ICD-10-CM | POA: Diagnosis not present

## 2018-08-30 DIAGNOSIS — Z992 Dependence on renal dialysis: Secondary | ICD-10-CM | POA: Diagnosis not present

## 2018-08-30 DIAGNOSIS — N186 End stage renal disease: Secondary | ICD-10-CM | POA: Diagnosis not present

## 2018-08-31 DIAGNOSIS — N186 End stage renal disease: Secondary | ICD-10-CM | POA: Diagnosis not present

## 2018-08-31 DIAGNOSIS — Z992 Dependence on renal dialysis: Secondary | ICD-10-CM | POA: Diagnosis not present

## 2018-09-05 DIAGNOSIS — N186 End stage renal disease: Secondary | ICD-10-CM | POA: Diagnosis not present

## 2018-09-05 DIAGNOSIS — Z992 Dependence on renal dialysis: Secondary | ICD-10-CM | POA: Diagnosis not present

## 2018-09-06 ENCOUNTER — Ambulatory Visit (INDEPENDENT_AMBULATORY_CARE_PROVIDER_SITE_OTHER): Payer: BLUE CROSS/BLUE SHIELD | Admitting: Vascular Surgery

## 2018-09-06 ENCOUNTER — Ambulatory Visit (HOSPITAL_COMMUNITY)
Admission: RE | Admit: 2018-09-06 | Discharge: 2018-09-06 | Disposition: A | Discharge status: Home / Self Care | Payer: BLUE CROSS/BLUE SHIELD | Source: Ambulatory Visit | Attending: Vascular Surgery | Admitting: Vascular Surgery

## 2018-09-06 ENCOUNTER — Other Ambulatory Visit: Payer: Self-pay

## 2018-09-06 ENCOUNTER — Encounter: Payer: Self-pay | Admitting: Vascular Surgery

## 2018-09-06 VITALS — BP 192/87 | HR 82 | Temp 98.3°F | Resp 20 | Ht 66.0 in | Wt 179.0 lb

## 2018-09-06 DIAGNOSIS — T82510A Breakdown (mechanical) of surgically created arteriovenous fistula, initial encounter: Secondary | ICD-10-CM | POA: Diagnosis not present

## 2018-09-06 DIAGNOSIS — N186 End stage renal disease: Secondary | ICD-10-CM

## 2018-09-06 NOTE — Progress Notes (Signed)
[facility]  Patient name: [name] MRN: [id number] DOB: June 15, 1966 Sex: female  REASON FOR CONSULT: Evaluation of her current left upper arm AV fistula for second opinion regarding treatment options  HPI: [name] is a 53 y.o. female, who is her today for discussion of her left upper arm AV fistula.  Currently is undergoing hemodialysis via right IJ catheter reports no difficulty with this.  She had creation of a left arm AV fistula with a novel approach with Dr Delana Meyer on 12/08/2017.  There was creation of a fistula using RF magnetic guidance.  She has maintained a fistula but this is been moderate size for development and also runs excessively deep under the fat to be usable for fistula.  She had had recommendation of revision of this to move the fistula to the surface.  She is here today for second opinion to discuss this.  Past Medical History:  Diagnosis Date  . Anemia    vitamin d3 deficiency  . Anxiety   . Chronic kidney disease    End Stage Renal Disease  . Diabetes mellitus without complication ([place])   . GERD (gastroesophageal reflux disease)    nothing over last few years  . History of kidney stones 2000  . Hypertension   . Neuromuscular disorder (HCC)    neuropathy in feet  . PONV (postoperative nausea and vomiting)    severe nausea requiring many doses of post op antiemetics  . Stroke ([place]) 10/2017   thinks she had a series of mini strokes.right leg up to right side of face were numb. no loss of consciousness    Family History  Problem Relation Age of Onset  . Emphysema Mother   . COPD Mother   . Cancer Father   . Cancer Paternal Aunt   . Diabetes Sister   . Hypertension Sister   . Eczema Sister   . Diabetes Brother   . Hypertension Brother   . Cardiomyopathy Brother   . Alcohol abuse Brother     SOCIAL HISTORY: Social History   Socioeconomic History  . Marital status: Married   Spouse name: Aaron Edelman  . Number of children: 0  . Years of education: Not on file  . Highest education level: Some college, no degree  Occupational History  . Occupation: Clinical cytogeneticist  Social Needs  . Financial resource strain: Not hard at all  . Food insecurity:    Worry: Never true    Inability: Never true  . Transportation needs:    Medical: No    Non-medical: No  Tobacco Use  . Smoking status: Never Smoker  . Smokeless tobacco: Never Used  Substance and Sexual Activity  . Alcohol use: No  . Drug use: Never  . Sexual activity: Yes    Partners: Male  Lifestyle  . Physical activity:    Days per week: 7 days    Minutes per session: 30 min  . Stress: Not at all  Relationships  . Social connections:    Talks on phone: More than three times a week    Gets together: More than three times a week    Attends religious service: More than 4 times per year    Active member of club or organization: No    Attends meetings of clubs or organizations: Never    Relationship status: Married  . Intimate partner violence:    Fear of current or ex partner: No    Emotionally abused: No  Physically abused: No    Forced sexual activity: No  Other Topics Concern  . Not on file  Social History Narrative  . Not on file    Allergies  Allergen Reactions  . Enalapril Hives and Other (See Comments)    Angioedema face.  Samuel Germany Dye [Iodinated Diagnostic Agents] Hives  . Lisinopril Shortness Of Breath  . Shellfish Allergy Swelling and Other (See Comments)    patient tolerates shellfish by mouth without problem    Current Outpatient Medications  Medication Sig Dispense Refill  . Amino Acids (AMINO ACID PO) Take 2 tablets by mouth 2 (two) times daily. Vitality Pack Supplement Amino Acid    . amLODipine (NORVASC) 5 MG tablet Take 5 mg by mouth 2 (two) times daily.    . blood glucose meter kit and supplies Dispense based on patient and insurance preference. Use up to four times  daily as directed. (FOR ICD-10 E10.9, E11.9). 1 each 0  . [place] Oil Fragrance (YLANG-YLANG OIL FRAGRANCE) OIL Take 6 drops by mouth at bedtime.     Marland Kitchen glucose blood test strip Use as instructed One Touch Ultra 200 each 2  . Insulin Pen Needle (NOVOFINE) 32G X 6 MM MISC 1 each by Does not apply route 3 (three) times daily with meals. 200 each 1  . Lancets (ONETOUCH ULTRASOFT) lancets Use as instructed 200 each 2  . Lemon Oil OIL 8 drops by Does not apply route.    . lidocaine (LIDODERM) 5 % Place 2 patches onto the skin daily. Remove & Discard patch within 12 hours or as directed by MD 60 patch 0  . Methyl Salicylate (WINTERGREEN OIL) OIL Take 5 application by mouth. Takes 5 gtts daily as a blood thinner that does not affect kidneys     . Multiple Vitamins-Minerals (MULTIVITAMIN PO) Take 2 tablets 2 (two) times daily by mouth. Vitality Pack Supplement    . NON FORMULARY as needed (fragrant oils used via inhaling and diffuser).    . NOVOLOG FLEXPEN 100 UNIT/ML FlexPen INJECT 5-15 UNITS INTO THE SKIN DAILY BEFORE SUPPER. 15 mL 0  . OVER THE COUNTER MEDICATION Take 2 tablets 2 (two) times daily by mouth. Cellular Repair Vitality Pack Supplement    . OVER THE COUNTER MEDICATION Take 4 capsules by mouth 2 (two) times daily. Transfer Factor (Kidney, Glucose, Regular generic)    . peppermint oil liquid Apply 1 application topically as needed (uses on skin).     . Cholecalciferol (VITAMIN D PO) Take 5,000 Units by mouth daily.     . Insulin Degludec (TRESIBA FLEXTOUCH) 200 UNIT/ML SOPN Inject 70 Units into the skin daily. (Patient not taking: Reported on 09/06/2018) 15 mL 2  . Magnesium 200 MG TABS Take 200 mg by mouth 2 (two) times daily.    . Semaglutide (OZEMPIC) 1 MG/DOSE SOPN Inject 1 mg into the skin once a week. (Patient not taking: Reported on 09/06/2018) 9 mL 1   No current facility-administered medications for this visit.     REVIEW OF SYSTEMS:  _0  denotes positive finding, _1  denotes  negative finding Cardiac  Comments:  Chest pain or chest pressure:    Shortness of breath upon exertion:    Short of breath when lying flat:    Irregular heart rhythm:        Vascular    Pain in calf, thigh, or hip brought on by ambulation:    Pain in feet at night that wakes you up from your sleep:  Blood clot in your veins:    Leg swelling:         Pulmonary    Oxygen at home:    Productive cough:     Wheezing:         Neurologic    Sudden weakness in arms or legs:     Sudden numbness in arms or legs:     Sudden onset of difficulty speaking or slurred speech:    Temporary loss of vision in one eye:     Problems with dizziness:         Gastrointestinal    Blood in stool:     Vomited blood:         Genitourinary    Burning when urinating:     Blood in urine:        Psychiatric    Major depression:         Hematologic    Bleeding problems:    Problems with blood clotting too easily:        Skin    Rashes or ulcers:        Constitutional    Fever or chills:      PHYSICAL EXAM: Vitals:   09/06/18 1148  BP: (!) 192/87  Pulse: 82  Resp: 20  Temp: 98.3 F (36.8 C)  SpO2: 98%  Weight: 179 lb (81.2 kg)  Height: _0  (1.676 m)    GENERAL: The patient is a well-nourished female, in no acute distress. The vital signs are documented above. CARDIOVASCULAR: Patient does have a left upper arm AV fistula.  There does not appear to be a great deal of arterial flow into this fistula although it is patent.  On my imaging this of SonoSite ultrasound it is patent throughout the upper arm and does run deep to the fat. PULMONARY: There is good air exchange  ABDOMEN: Soft and non-tender  MUSCULOSKELETAL: There are no major deformities or cyanosis. NEUROLOGIC: No focal weakness or paresthesias are detected. SKIN: There are no ulcers or rashes noted. PSYCHIATRIC: The patient has a normal affect.  DATA:  Formal duplex of this area revealed diameter ranging around 5 mm  and up to a centimeter deep under the fat  MEDICAL ISSUES: I discussed these findings at length with the patient and her husband present.  I do not feel that this will be adequate for hemodialysis since she is now over 6 months out from creation.  I have explained that my recommendation would be for revision with translocation of her cephalic vein.  Would redo the anastomosis at the same setting since it does not appear to have a great deal of arterial flow into the fistula.  Patient and her husband appreciated this discussion.  They wish to consider their options.  She feels strongly that she will be able to discontinue dialysis with natural treatments.  We are available for assistance in any way possible.   Rosetta Posner, MD FACS [facility] Tel [phone] Pager [phone]

## 2018-09-07 DIAGNOSIS — Z992 Dependence on renal dialysis: Secondary | ICD-10-CM | POA: Diagnosis not present

## 2018-09-07 DIAGNOSIS — N186 End stage renal disease: Secondary | ICD-10-CM | POA: Diagnosis not present

## 2018-09-09 DIAGNOSIS — Z992 Dependence on renal dialysis: Secondary | ICD-10-CM | POA: Diagnosis not present

## 2018-09-09 DIAGNOSIS — N186 End stage renal disease: Secondary | ICD-10-CM | POA: Diagnosis not present

## 2018-09-11 ENCOUNTER — Other Ambulatory Visit: Payer: Self-pay | Admitting: Family Medicine

## 2018-09-11 DIAGNOSIS — N185 Chronic kidney disease, stage 5: Principal | ICD-10-CM

## 2018-09-11 DIAGNOSIS — E1122 Type 2 diabetes mellitus with diabetic chronic kidney disease: Secondary | ICD-10-CM

## 2018-09-11 DIAGNOSIS — I120 Hypertensive chronic kidney disease with stage 5 chronic kidney disease or end stage renal disease: Principal | ICD-10-CM

## 2018-09-11 NOTE — Telephone Encounter (Signed)
Give enough until follow up, not seen in over 4 months

## 2018-09-12 DIAGNOSIS — Z992 Dependence on renal dialysis: Secondary | ICD-10-CM | POA: Diagnosis not present

## 2018-09-12 DIAGNOSIS — N186 End stage renal disease: Secondary | ICD-10-CM | POA: Diagnosis not present

## 2018-09-12 NOTE — Telephone Encounter (Signed)
[facility] only [facility] by the box which will be a little more than a 15 day supply. Dr. Ancil Boozer ok'ed. Called in to the pharmacy 09/12/2018 at 3:03 p.m.

## 2018-09-14 DIAGNOSIS — N186 End stage renal disease: Secondary | ICD-10-CM | POA: Diagnosis not present

## 2018-09-14 DIAGNOSIS — Z992 Dependence on renal dialysis: Secondary | ICD-10-CM | POA: Diagnosis not present

## 2018-09-16 DIAGNOSIS — Z992 Dependence on renal dialysis: Secondary | ICD-10-CM | POA: Diagnosis not present

## 2018-09-16 DIAGNOSIS — N186 End stage renal disease: Secondary | ICD-10-CM | POA: Diagnosis not present

## 2018-09-19 DIAGNOSIS — Z992 Dependence on renal dialysis: Secondary | ICD-10-CM | POA: Diagnosis not present

## 2018-09-19 DIAGNOSIS — N186 End stage renal disease: Secondary | ICD-10-CM | POA: Diagnosis not present

## 2018-09-21 DIAGNOSIS — N186 End stage renal disease: Secondary | ICD-10-CM | POA: Diagnosis not present

## 2018-09-21 DIAGNOSIS — Z992 Dependence on renal dialysis: Secondary | ICD-10-CM | POA: Diagnosis not present

## 2018-09-23 DIAGNOSIS — N186 End stage renal disease: Secondary | ICD-10-CM | POA: Diagnosis not present

## 2018-09-23 DIAGNOSIS — Z992 Dependence on renal dialysis: Secondary | ICD-10-CM | POA: Diagnosis not present

## 2018-09-27 ENCOUNTER — Ambulatory Visit: Payer: BLUE CROSS/BLUE SHIELD | Admitting: Family Medicine

## 2018-09-27 ENCOUNTER — Encounter: Payer: Self-pay | Admitting: Family Medicine

## 2018-09-27 VITALS — BP 160/80 | HR 98 | Temp 97.8°F | Resp 16 | Ht 66.0 in | Wt 172.6 lb

## 2018-09-27 DIAGNOSIS — E139 Other specified diabetes mellitus without complications: Secondary | ICD-10-CM | POA: Diagnosis not present

## 2018-09-27 DIAGNOSIS — E1140 Type 2 diabetes mellitus with diabetic neuropathy, unspecified: Secondary | ICD-10-CM | POA: Insufficient documentation

## 2018-09-27 DIAGNOSIS — E1342 Other specified diabetes mellitus with diabetic polyneuropathy: Secondary | ICD-10-CM

## 2018-09-27 DIAGNOSIS — Z992 Dependence on renal dialysis: Secondary | ICD-10-CM

## 2018-09-27 DIAGNOSIS — N186 End stage renal disease: Secondary | ICD-10-CM | POA: Diagnosis not present

## 2018-09-27 DIAGNOSIS — I120 Hypertensive chronic kidney disease with stage 5 chronic kidney disease or end stage renal disease: Secondary | ICD-10-CM

## 2018-09-27 DIAGNOSIS — N2581 Secondary hyperparathyroidism of renal origin: Secondary | ICD-10-CM

## 2018-09-27 DIAGNOSIS — E1122 Type 2 diabetes mellitus with diabetic chronic kidney disease: Secondary | ICD-10-CM

## 2018-09-27 DIAGNOSIS — E785 Hyperlipidemia, unspecified: Secondary | ICD-10-CM

## 2018-09-27 DIAGNOSIS — N185 Chronic kidney disease, stage 5: Secondary | ICD-10-CM

## 2018-09-27 DIAGNOSIS — E1169 Type 2 diabetes mellitus with other specified complication: Secondary | ICD-10-CM

## 2018-09-27 LAB — POCT GLYCOSYLATED HEMOGLOBIN (HGB A1C): HbA1c, POC (controlled diabetic range): 8.3 % — AB (ref 0.0–7.0)

## 2018-09-27 MED ORDER — INSULIN DEGLUDEC 200 UNIT/ML ~~LOC~~ SOPN: 50.0000 [IU] | PEN_INJECTOR | Freq: Every day | SUBCUTANEOUS | 0 refills | Status: DC

## 2018-09-27 MED ORDER — GLUCOSE BLOOD VI STRP: ORAL_STRIP | 2 refills | Status: DC

## 2018-09-27 NOTE — Progress Notes (Signed)
Name: [name]   MRN: [id number]    DOB: [birth year]   Date:09/27/2018       Progress Note  Subjective  Chief Complaint  Chief Complaint  Patient presents with  . Medication Refill    2 month F/U  . Diabetes    Is on Dialysis-3x weekly, seen Dietian and switched her diet to more protein every meal-Checks BS a couple of times a day- Average-345. Did not like side effect of Tresiba and quit taking. Taking Novolog-20 units before she eats 3x daily.  . Hypertension    HPI  DMII: diagnosed years ago, type II diabetes but started insulin therapy in 2008, she is on pre-meal insulin and basal insulin ( but she stopped using months ago because she was worried about risk of pancreatitis)  Her hgbA1C has not been at goal for a long time. She has multiple complications of diabetes, including hypertension, end stage renal disease, dyslipidemia and an episode of foot ulcer ( that has resolved now) She has been getting lantus from nephrologist because lost to follow up with PCP. HgbA1C  Last visit was  10 and today is below 9 now . She states when she was using [place], Ozempic and pre meal insulin her glucose was well controlled in the low 100's, however a few months ago she stopped using because she read about risk of pancreatitis. She has been off Ozempic and [place] since, and glucose is up again. Only on Novolog now. Explained importance of seeing Endo, but she would like to hold off another 3 months, she states she is now avoiding carbs, and will resume tresiba we will discuss referral again if A1C not at goal on her next visit   Dyslipidemia: reviewed records from [facility], she refuses statin therapy, but explained that if triglycerides is very high we may start a rx fish oil. She believes in essential oils. Unchanged    Patient Active Problem List   Diagnosis Date Noted  . Diabetic neuropathy ([place]) 09/27/2018  . End stage renal disease on dialysis ([place]) 09/27/2018  . Secondary hyperparathyroidism of  renal origin ([place]) 03/07/2018  . Stage 5 chronic renal impairment associated with type 2 diabetes mellitus ([place]) 07/26/2017  . Uncontrolled hypertension 07/26/2017  . Hyperphosphatemia 09/29/2016  . Metabolic acidosis 83/15/1761  . Dyslipidemia 08/08/2015    Past Surgical History:  Procedure Laterality Date  . ABDOMINAL HYSTERECTOMY  2007  . AV FISTULA INSERTION W/ RF MAGNETIC GUIDANCE N/A 12/08/2017   Procedure: AV FISTULA INSERTION W/RF MAGNETIC GUIDANCE;  Surgeon: Katha Cabal, MD;  Location: [place] [facility];  Service: Cardiovascular;  Laterality: N/A;  . ROTATOR CUFF REPAIR Right 2004  . SHOULDER CLOSED REDUCTION Right 2004  . UPPER EXTREMITY VENOGRAPHY Left 02/15/2018   Procedure: UPPER EXTREMITY VENOGRAPHY;  Surgeon: Katha Cabal, MD;  Location: [place] [facility];  Service: Cardiovascular;  Laterality: Left;    Family History  Problem Relation Age of Onset  . Emphysema Mother   . COPD Mother   . Cancer Father   . Cancer Paternal Aunt   . Diabetes Sister   . Hypertension Sister   . Eczema Sister   . Diabetes Brother   . Hypertension Brother   . Cardiomyopathy Brother   . Alcohol abuse Brother     Social History   Socioeconomic History  . Marital status: Married    Spouse name: Aaron Edelman  . Number of children: 0  . Years of education: Not on file  . Highest  education level: Some college, no degree  Occupational History  . Occupation: Clinical cytogeneticist  Social Needs  . Financial resource strain: Not hard at all  . Food insecurity:    Worry: Never true    Inability: Never true  . Transportation needs:    Medical: No    Non-medical: No  Tobacco Use  . Smoking status: Never Smoker  . Smokeless tobacco: Never Used  Substance and Sexual Activity  . Alcohol use: No  . Drug use: Never  . Sexual activity: Yes    Partners: Male  Lifestyle  . Physical activity:    Days per week: 7 days    Minutes per session: 30 min  . Stress: Not at all   Relationships  . Social connections:    Talks on phone: More than three times a week    Gets together: More than three times a week    Attends religious service: More than 4 times per year    Active member of club or organization: No    Attends meetings of clubs or organizations: Never    Relationship status: Married  . Intimate partner violence:    Fear of current or ex partner: No    Emotionally abused: No    Physically abused: No    Forced sexual activity: No  Other Topics Concern  . Not on file  Social History Narrative  . Not on file     Current Outpatient Medications:  .  Amino Acids (AMINO ACID PO), Take 2 tablets by mouth 2 (two) times daily. Vitality Pack Supplement Amino Acid, Disp: , Rfl:  .  amLODipine (NORVASC) 5 MG tablet, Take 5 mg by mouth 2 (two) times daily., Disp: , Rfl:  .  blood glucose meter kit and supplies, Dispense based on patient and insurance preference. Use up to four times daily as directed. (FOR ICD-10 E10.9, E11.9)., Disp: 1 each, Rfl: 0 .  Cananga Oil Fragrance (YLANG-YLANG OIL FRAGRANCE) OIL, Take 6 drops by mouth at bedtime. , Disp: , Rfl:  .  Ferrous Sulfate (IRON SUPPLEMENT PO), Take 1 capsule by mouth daily. Raw organic iron supplement-opens up and put in her shake, Disp: , Rfl:  .  glucose blood test strip, One Touch Ultra, check fsbs 5 times daily, Disp: 200 each, Rfl: 2 .  Insulin Pen Needle (NOVOFINE) 32G X 6 MM MISC, 1 each by Does not apply route 3 (three) times daily with meals., Disp: 200 each, Rfl: 1 .  Lancets (ONETOUCH ULTRASOFT) lancets, Use as instructed, Disp: 200 each, Rfl: 2 .  Lemon Oil OIL, 8 drops by Does not apply route., Disp: , Rfl:  .  losartan (COZAAR) 50 MG tablet, Take 50 mg by mouth daily. , Disp: , Rfl:  .  Methyl Salicylate (WINTERGREEN OIL) OIL, Take 5 application by mouth. Takes 5 gtts daily as a blood thinner that does not affect kidneys , Disp: , Rfl:  .  Multiple Vitamins-Minerals (MULTIVITAMIN PO), Take 2  tablets 2 (two) times daily by mouth. Vitality Pack Supplement, Disp: , Rfl:  .  NOVOLOG FLEXPEN 100 UNIT/ML FlexPen, INJECT 5-15 UNITS INTO THE SKIN DAILY BEFORE SUPPER., Disp: 15 mL, Rfl: 0 .  OVER THE COUNTER MEDICATION, Take 2 tablets 2 (two) times daily by mouth. Cellular Repair Vitality Pack Supplement, Disp: , Rfl:  .  OVER THE COUNTER MEDICATION, Take 4 capsules by mouth 2 (two) times daily. Transfer Factor (Kidney, Glucose, Regular generic), Disp: , Rfl:  .  peppermint  oil liquid, Apply 1 application topically as needed (uses on skin). , Disp: , Rfl:  .  Cholecalciferol (VITAMIN D PO), Take 5,000 Units by mouth daily. , Disp: , Rfl:  .  Insulin Degludec (TRESIBA FLEXTOUCH) 200 UNIT/ML SOPN, Inject 50 Units into the skin daily., Disp: 15 mL, Rfl: 0 .  Magnesium 200 MG TABS, Take 200 mg by mouth 2 (two) times daily., Disp: , Rfl:  .  NON FORMULARY, as needed (oils used via inhaling and diffuser). , Disp: , Rfl:   Allergies  Allergen Reactions  . Enalapril Hives and Other (See Comments)    Angioedema face.  Samuel Germany Dye [Iodinated Diagnostic Agents] Hives  . Lisinopril Shortness Of Breath  . Shellfish Allergy Swelling and Other (See Comments)    patient tolerates shellfish by mouth without problem    I personally reviewed active problem list, medication list, allergies, family history, social history with the patient/caregiver today.   ROS  Constitutional: Negative for fever , positive for mild  weight change.  Respiratory: Negative for cough and shortness of breath.   Cardiovascular: Negative for chest pain or palpitations.  Gastrointestinal: Negative for abdominal pain, no bowel changes.  Musculoskeletal: Negative for gait problem or joint swelling.  Skin: Negative for rash.  Neurological: Negative for dizziness or headache.  No other specific complaints in a complete review of systems (except as listed in HPI above).  Objective  Vitals:   09/27/18 1137  BP: (!) 160/80   Pulse: 98  Resp: 16  Temp: 97.8 F (36.6 C)  TempSrc: Oral  SpO2: 99%  Weight: 172 lb 9.6 oz (78.3 kg)  Height: '5\' 6"'$  (1.676 m)    Body mass index is 27.86 kg/m.  Physical Exam  Constitutional: Patient appears well-developed and well-nourished. Overweight. No distress.  HEENT: head atraumatic, normocephalic, pupils equal and reactive to light, neck supple, throat within normal limits Cardiovascular: Normal rate, regular rhythm and normal heart sounds.  No murmur heard. No BLE edema. Pulmonary/Chest: Effort normal and breath sounds normal. No respiratory distress. Abdominal: Soft.  There is no tenderness. Psychiatric: Patient has a normal mood and affect. behavior is normal. Judgment and thought content normal.  PHQ2/9: Depression screen [facility] 2/9 09/27/2018 04/20/2018 03/07/2018  Decreased Interest 0 0 0  Down, Depressed, Hopeless 0 0 0  PHQ - 2 Score 0 0 0  Altered sleeping - 3 0  Tired, decreased energy - 0 0  Change in appetite - 0 0  Feeling bad or failure about yourself  - 0 0  Trouble concentrating - 0 0  Moving slowly or fidgety/restless - 0 0  Suicidal thoughts - 0 0  PHQ-9 Score - 3 0     Fall Risk: Fall Risk  09/27/2018 04/20/2018 03/07/2018  Falls in the past year? 0 No No     Functional Status Survey: Is the patient deaf or have difficulty hearing?: No Does the patient have difficulty seeing, even when wearing glasses/contacts?: Yes Does the patient have difficulty concentrating, remembering, or making decisions?: No Does the patient have difficulty walking or climbing stairs?: No Does the patient have difficulty dressing or bathing?: No Does the patient have difficulty doing errands alone such as visiting a doctor's office or shopping?: No    Assessment & Plan  1. Diabetes 1.5, managed as type 1 ([place])  - POCT HgB A1C - Ambulatory referral to Endocrinology  2. Diabetic polyneuropathy associated with other specified diabetes mellitus ([place])  - POCT HgB  A1C - Ambulatory  referral to Endocrinology ( refused )  - Insulin Degludec (TRESIBA FLEXTOUCH) 200 UNIT/ML SOPN; Inject 50 Units into the skin daily.  Dispense: 15 mL; Refill: 0  3. End stage renal disease on dialysis [facility])  Under the care of Dr. Holley Raring   4. Secondary hyperparathyroidism of renal origin ([place])   5. Type 2 diabetes mellitus with stage 5 chronic kidney disease and hypertension (HCC)  - glucose blood test strip; One Touch Ultra, check fsbs 5 times daily  Dispense: 200 each; Refill: 2 - Insulin Degludec (TRESIBA FLEXTOUCH) 200 UNIT/ML SOPN; Inject 50 Units into the skin daily.  Dispense: 15 mL; Refill: 0  6. Dyslipidemia associated with type 2 diabetes mellitus (HCC)  - Insulin Degludec (TRESIBA FLEXTOUCH) 200 UNIT/ML SOPN; Inject 50 Units into the skin daily.  Dispense: 15 mL; Refill: 0

## 2018-09-28 DIAGNOSIS — N186 End stage renal disease: Secondary | ICD-10-CM | POA: Diagnosis not present

## 2018-09-28 DIAGNOSIS — Z992 Dependence on renal dialysis: Secondary | ICD-10-CM | POA: Diagnosis not present

## 2018-09-30 DIAGNOSIS — Z992 Dependence on renal dialysis: Secondary | ICD-10-CM | POA: Diagnosis not present

## 2018-09-30 DIAGNOSIS — N186 End stage renal disease: Secondary | ICD-10-CM | POA: Diagnosis not present

## 2018-10-03 DIAGNOSIS — N186 End stage renal disease: Secondary | ICD-10-CM | POA: Diagnosis not present

## 2018-10-03 DIAGNOSIS — Z992 Dependence on renal dialysis: Secondary | ICD-10-CM | POA: Diagnosis not present

## 2018-10-05 DIAGNOSIS — Z992 Dependence on renal dialysis: Secondary | ICD-10-CM | POA: Diagnosis not present

## 2018-10-05 DIAGNOSIS — N186 End stage renal disease: Secondary | ICD-10-CM | POA: Diagnosis not present

## 2018-10-07 DIAGNOSIS — Z992 Dependence on renal dialysis: Secondary | ICD-10-CM | POA: Diagnosis not present

## 2018-10-07 DIAGNOSIS — N186 End stage renal disease: Secondary | ICD-10-CM | POA: Diagnosis not present

## 2018-10-10 DIAGNOSIS — Z992 Dependence on renal dialysis: Secondary | ICD-10-CM | POA: Diagnosis not present

## 2018-10-10 DIAGNOSIS — N186 End stage renal disease: Secondary | ICD-10-CM | POA: Diagnosis not present

## 2018-10-12 DIAGNOSIS — N186 End stage renal disease: Secondary | ICD-10-CM | POA: Diagnosis not present

## 2018-10-12 DIAGNOSIS — Z992 Dependence on renal dialysis: Secondary | ICD-10-CM | POA: Diagnosis not present

## 2018-10-19 DIAGNOSIS — N186 End stage renal disease: Secondary | ICD-10-CM | POA: Diagnosis not present

## 2018-10-19 DIAGNOSIS — Z992 Dependence on renal dialysis: Secondary | ICD-10-CM | POA: Diagnosis not present

## 2018-10-21 DIAGNOSIS — N186 End stage renal disease: Secondary | ICD-10-CM | POA: Diagnosis not present

## 2018-10-21 DIAGNOSIS — Z992 Dependence on renal dialysis: Secondary | ICD-10-CM | POA: Diagnosis not present

## 2018-10-24 DIAGNOSIS — N186 End stage renal disease: Secondary | ICD-10-CM | POA: Diagnosis not present

## 2018-10-24 DIAGNOSIS — Z992 Dependence on renal dialysis: Secondary | ICD-10-CM | POA: Diagnosis not present

## 2018-10-26 DIAGNOSIS — Z992 Dependence on renal dialysis: Secondary | ICD-10-CM | POA: Diagnosis not present

## 2018-10-26 DIAGNOSIS — N186 End stage renal disease: Secondary | ICD-10-CM | POA: Diagnosis not present

## 2018-10-28 DIAGNOSIS — N186 End stage renal disease: Secondary | ICD-10-CM | POA: Diagnosis not present

## 2018-10-28 DIAGNOSIS — Z992 Dependence on renal dialysis: Secondary | ICD-10-CM | POA: Diagnosis not present

## 2018-10-29 DIAGNOSIS — Z992 Dependence on renal dialysis: Secondary | ICD-10-CM | POA: Diagnosis not present

## 2018-10-29 DIAGNOSIS — N186 End stage renal disease: Secondary | ICD-10-CM | POA: Diagnosis not present

## 2018-10-31 ENCOUNTER — Other Ambulatory Visit: Payer: Self-pay | Admitting: Family Medicine

## 2018-10-31 DIAGNOSIS — E1122 Type 2 diabetes mellitus with diabetic chronic kidney disease: Secondary | ICD-10-CM

## 2018-10-31 DIAGNOSIS — Z992 Dependence on renal dialysis: Secondary | ICD-10-CM | POA: Diagnosis not present

## 2018-10-31 DIAGNOSIS — N185 Chronic kidney disease, stage 5: Principal | ICD-10-CM

## 2018-10-31 DIAGNOSIS — I120 Hypertensive chronic kidney disease with stage 5 chronic kidney disease or end stage renal disease: Principal | ICD-10-CM

## 2018-10-31 DIAGNOSIS — N186 End stage renal disease: Secondary | ICD-10-CM | POA: Diagnosis not present

## 2018-11-02 DIAGNOSIS — Z992 Dependence on renal dialysis: Secondary | ICD-10-CM | POA: Diagnosis not present

## 2018-11-02 DIAGNOSIS — N186 End stage renal disease: Secondary | ICD-10-CM | POA: Diagnosis not present

## 2018-11-09 DIAGNOSIS — N186 End stage renal disease: Secondary | ICD-10-CM | POA: Diagnosis not present

## 2018-11-09 DIAGNOSIS — Z992 Dependence on renal dialysis: Secondary | ICD-10-CM | POA: Diagnosis not present

## 2018-11-11 DIAGNOSIS — N186 End stage renal disease: Secondary | ICD-10-CM | POA: Diagnosis not present

## 2018-11-11 DIAGNOSIS — Z992 Dependence on renal dialysis: Secondary | ICD-10-CM | POA: Diagnosis not present

## 2018-11-14 DIAGNOSIS — Z992 Dependence on renal dialysis: Secondary | ICD-10-CM | POA: Diagnosis not present

## 2018-11-14 DIAGNOSIS — N186 End stage renal disease: Secondary | ICD-10-CM | POA: Diagnosis not present

## 2018-11-16 DIAGNOSIS — N186 End stage renal disease: Secondary | ICD-10-CM | POA: Diagnosis not present

## 2018-11-16 DIAGNOSIS — Z992 Dependence on renal dialysis: Secondary | ICD-10-CM | POA: Diagnosis not present

## 2018-11-18 DIAGNOSIS — Z992 Dependence on renal dialysis: Secondary | ICD-10-CM | POA: Diagnosis not present

## 2018-11-18 DIAGNOSIS — N186 End stage renal disease: Secondary | ICD-10-CM | POA: Diagnosis not present

## 2018-11-21 DIAGNOSIS — N186 End stage renal disease: Secondary | ICD-10-CM | POA: Diagnosis not present

## 2018-11-21 DIAGNOSIS — Z992 Dependence on renal dialysis: Secondary | ICD-10-CM | POA: Diagnosis not present

## 2018-11-23 DIAGNOSIS — N186 End stage renal disease: Secondary | ICD-10-CM | POA: Diagnosis not present

## 2018-11-23 DIAGNOSIS — Z992 Dependence on renal dialysis: Secondary | ICD-10-CM | POA: Diagnosis not present

## 2018-11-25 DIAGNOSIS — Z992 Dependence on renal dialysis: Secondary | ICD-10-CM | POA: Diagnosis not present

## 2018-11-25 DIAGNOSIS — N186 End stage renal disease: Secondary | ICD-10-CM | POA: Diagnosis not present

## 2018-11-28 DIAGNOSIS — N186 End stage renal disease: Secondary | ICD-10-CM | POA: Diagnosis not present

## 2018-11-28 DIAGNOSIS — Z992 Dependence on renal dialysis: Secondary | ICD-10-CM | POA: Diagnosis not present

## 2018-11-29 DIAGNOSIS — Z992 Dependence on renal dialysis: Secondary | ICD-10-CM | POA: Diagnosis not present

## 2018-11-29 DIAGNOSIS — N186 End stage renal disease: Secondary | ICD-10-CM | POA: Diagnosis not present

## 2018-11-30 DIAGNOSIS — N186 End stage renal disease: Secondary | ICD-10-CM | POA: Diagnosis not present

## 2018-11-30 DIAGNOSIS — Z992 Dependence on renal dialysis: Secondary | ICD-10-CM | POA: Diagnosis not present

## 2018-12-02 DIAGNOSIS — Z992 Dependence on renal dialysis: Secondary | ICD-10-CM | POA: Diagnosis not present

## 2018-12-02 DIAGNOSIS — N186 End stage renal disease: Secondary | ICD-10-CM | POA: Diagnosis not present

## 2018-12-05 DIAGNOSIS — Z992 Dependence on renal dialysis: Secondary | ICD-10-CM | POA: Diagnosis not present

## 2018-12-05 DIAGNOSIS — N186 End stage renal disease: Secondary | ICD-10-CM | POA: Diagnosis not present

## 2018-12-07 DIAGNOSIS — N186 End stage renal disease: Secondary | ICD-10-CM | POA: Diagnosis not present

## 2018-12-07 DIAGNOSIS — Z992 Dependence on renal dialysis: Secondary | ICD-10-CM | POA: Diagnosis not present

## 2018-12-12 DIAGNOSIS — Z992 Dependence on renal dialysis: Secondary | ICD-10-CM | POA: Diagnosis not present

## 2018-12-12 DIAGNOSIS — N186 End stage renal disease: Secondary | ICD-10-CM | POA: Diagnosis not present

## 2018-12-14 DIAGNOSIS — Z992 Dependence on renal dialysis: Secondary | ICD-10-CM | POA: Diagnosis not present

## 2018-12-14 DIAGNOSIS — N186 End stage renal disease: Secondary | ICD-10-CM | POA: Diagnosis not present

## 2018-12-16 DIAGNOSIS — N186 End stage renal disease: Secondary | ICD-10-CM | POA: Diagnosis not present

## 2018-12-16 DIAGNOSIS — Z992 Dependence on renal dialysis: Secondary | ICD-10-CM | POA: Diagnosis not present

## 2018-12-19 DIAGNOSIS — N186 End stage renal disease: Secondary | ICD-10-CM | POA: Diagnosis not present

## 2018-12-19 DIAGNOSIS — Z992 Dependence on renal dialysis: Secondary | ICD-10-CM | POA: Diagnosis not present

## 2018-12-21 DIAGNOSIS — N186 End stage renal disease: Secondary | ICD-10-CM | POA: Diagnosis not present

## 2018-12-21 DIAGNOSIS — Z992 Dependence on renal dialysis: Secondary | ICD-10-CM | POA: Diagnosis not present

## 2018-12-23 DIAGNOSIS — N186 End stage renal disease: Secondary | ICD-10-CM | POA: Diagnosis not present

## 2018-12-23 DIAGNOSIS — Z992 Dependence on renal dialysis: Secondary | ICD-10-CM | POA: Diagnosis not present

## 2018-12-26 DIAGNOSIS — N186 End stage renal disease: Secondary | ICD-10-CM | POA: Diagnosis not present

## 2018-12-26 DIAGNOSIS — Z992 Dependence on renal dialysis: Secondary | ICD-10-CM | POA: Diagnosis not present

## 2018-12-28 DIAGNOSIS — Z992 Dependence on renal dialysis: Secondary | ICD-10-CM | POA: Diagnosis not present

## 2018-12-28 DIAGNOSIS — N186 End stage renal disease: Secondary | ICD-10-CM | POA: Diagnosis not present

## 2018-12-29 DIAGNOSIS — N186 End stage renal disease: Secondary | ICD-10-CM | POA: Diagnosis not present

## 2018-12-29 DIAGNOSIS — Z992 Dependence on renal dialysis: Secondary | ICD-10-CM | POA: Diagnosis not present

## 2019-01-03 ENCOUNTER — Ambulatory Visit (INDEPENDENT_AMBULATORY_CARE_PROVIDER_SITE_OTHER): Payer: BLUE CROSS/BLUE SHIELD | Admitting: Family Medicine

## 2019-01-03 ENCOUNTER — Telehealth: Payer: Self-pay

## 2019-01-03 ENCOUNTER — Encounter: Payer: Self-pay | Admitting: Family Medicine

## 2019-01-03 VITALS — BP 177/97 | HR 89 | Ht 66.0 in | Wt 177.0 lb

## 2019-01-03 DIAGNOSIS — E109 Type 1 diabetes mellitus without complications: Secondary | ICD-10-CM | POA: Insufficient documentation

## 2019-01-03 DIAGNOSIS — E1022 Type 1 diabetes mellitus with diabetic chronic kidney disease: Secondary | ICD-10-CM

## 2019-01-03 DIAGNOSIS — N2581 Secondary hyperparathyroidism of renal origin: Secondary | ICD-10-CM

## 2019-01-03 DIAGNOSIS — E139 Other specified diabetes mellitus without complications: Secondary | ICD-10-CM

## 2019-01-03 DIAGNOSIS — Z992 Dependence on renal dialysis: Secondary | ICD-10-CM

## 2019-01-03 DIAGNOSIS — N186 End stage renal disease: Secondary | ICD-10-CM | POA: Diagnosis not present

## 2019-01-03 DIAGNOSIS — E785 Hyperlipidemia, unspecified: Secondary | ICD-10-CM

## 2019-01-03 MED ORDER — INSULIN ASPART 100 UNIT/ML FLEXPEN: 10.0000 [IU] | PEN_INJECTOR | Freq: Three times a day (TID) | SUBCUTANEOUS | 2 refills | Status: DC

## 2019-01-03 MED ORDER — GLUCAGON (RDNA) 1 MG IJ KIT: 1.0000 mg | PACK | Freq: Once | INTRAMUSCULAR | 2 refills | Status: DC | PRN

## 2019-01-03 NOTE — Progress Notes (Signed)
Name: [name]   MRN: [id number]    DOB: 09-Apr-1966   Date:01/03/2019       Progress Note  Subjective  Chief Complaint  Chief Complaint  Patient presents with  . Medication Refill    Patient just not want to drive up to due her BP and A1C until it is safe to do so.   . Diabetes    3 night ago her BS dropped and heart was racing and sweating profusely  . Dyslipidemia  . Hypertension    Denies any symptoms    I connected with  [name]  on 01/03/19 at  2:00 PM EDT by a video enabled telemedicine application and verified that I am speaking with the correct person using two identifiers.  I discussed the limitations of evaluation and management by telemedicine and the availability of in person appointments. The patient expressed understanding and agreed to proceed. Staff also discussed with the patient that there may be a patient responsible charge related to this service. Patient Location: at home Provider Location: [facility]   HPI  DMII: diagnosed years ago, type II diabetes but started insulin therapy in 2008, she is on pre-meal insulin and basal insulin.  Her hgbA1C has not been at goal for a long time. She has multiple complications of diabetes, including hypertension, end stage renal disease, dyslipidemia and an episode of foot ulcer ( that has resolved now) She has been getting lantus from nephrologist because had  lost to follow up with me. HgbA1C  was  10 and last visit it was below 9, at [facility] 04/13 and patient states it was 8.1% We discussed referral to Endo on her last visit but she asked me to hold off for another few months, today she does not feel safe coming in secondary to COVID-19 but will come for labs. She states she feels great. She had one severe episode of hypoglycemia a couple of weeks ago because she use 22 units prior to dinner and again prior to a bowl of strawberries. Discussed adjusting dose and we will also send a rx for  glucagon kit, explained to her when to use it She denies polyphagia, she still has good urine output, leg pain has improved with HD  Dyslipidemia: explained that her triglycerides was very high and we will need to recheck it on her next visit.   Patient Active Problem List   Diagnosis Date Noted  . Diabetic neuropathy ([place]) 09/27/2018  . End stage renal disease on dialysis ([place]) 09/27/2018  . Secondary hyperparathyroidism of renal origin ([place]) 03/07/2018  . Stage 5 chronic renal impairment associated with type 2 diabetes mellitus ([place]) 07/26/2017  . Uncontrolled hypertension 07/26/2017  . Hyperphosphatemia 09/29/2016  . Metabolic acidosis 03/83/3383  . Dyslipidemia 08/08/2015    Past Surgical History:  Procedure Laterality Date  . ABDOMINAL HYSTERECTOMY  2007  . AV FISTULA INSERTION W/ RF MAGNETIC GUIDANCE N/A 12/08/2017   Procedure: AV FISTULA INSERTION W/RF MAGNETIC GUIDANCE;  Surgeon: Katha Cabal, MD;  Location: [place] [facility];  Service: Cardiovascular;  Laterality: N/A;  . ROTATOR CUFF REPAIR Right 2004  . SHOULDER CLOSED REDUCTION Right 2004  . UPPER EXTREMITY VENOGRAPHY Left 02/15/2018   Procedure: UPPER EXTREMITY VENOGRAPHY;  Surgeon: Katha Cabal, MD;  Location: [place] [facility];  Service: Cardiovascular;  Laterality: Left;    Family History  Problem Relation Age of Onset  . Emphysema Mother   . COPD Mother   .  Cancer Father   . Cancer Paternal Aunt   . Diabetes Sister   . Hypertension Sister   . Eczema Sister   . Diabetes Brother   . Hypertension Brother   . Cardiomyopathy Brother   . Alcohol abuse Brother     Social History   Socioeconomic History  . Marital status: Married    Spouse name: Aaron Edelman  . Number of children: 0  . Years of education: Not on file  . Highest education level: Some college, no degree  Occupational History  . Occupation: Clinical cytogeneticist  Social Needs  . Financial resource strain: Not hard at all  .  Food insecurity:    Worry: Never true    Inability: Never true  . Transportation needs:    Medical: No    Non-medical: No  Tobacco Use  . Smoking status: Never Smoker  . Smokeless tobacco: Never Used  Substance and Sexual Activity  . Alcohol use: No  . Drug use: Never  . Sexual activity: Yes    Partners: Male  Lifestyle  . Physical activity:    Days per week: 3 days    Minutes per session: 30 min  . Stress: Not at all  Relationships  . Social connections:    Talks on phone: More than three times a week    Gets together: More than three times a week    Attends religious service: More than 4 times per year    Active member of club or organization: No    Attends meetings of clubs or organizations: Never    Relationship status: Married  . Intimate partner violence:    Fear of current or ex partner: No    Emotionally abused: No    Physically abused: No    Forced sexual activity: No  Other Topics Concern  . Not on file  Social History Narrative  . Not on file     Current Outpatient Medications:  .  Amino Acids (AMINO ACID PO), Take 2 tablets by mouth 2 (two) times daily. Vitality Pack Supplement Amino Acid, Disp: , Rfl:  .  amLODipine (NORVASC) 5 MG tablet, Take 5 mg by mouth 2 (two) times daily., Disp: , Rfl:  .  AURYXIA 1 GM 210 MG(Fe) tablet, TAKE 2 TABLETS BY MOUTH WITH MEAL AND 1 TABLET WITH SNACK, Disp: , Rfl:  .  blood glucose meter kit and supplies, Dispense based on patient and insurance preference. Use up to four times daily as directed. (FOR ICD-10 E10.9, E11.9)., Disp: 1 each, Rfl: 0 .  Cananga Oil Fragrance (YLANG-YLANG OIL FRAGRANCE) OIL, Take 6 drops by mouth at bedtime. , Disp: , Rfl:  .  clove oil liquid, Take by mouth., Disp: , Rfl:  .  Ferrous Sulfate (IRON SUPPLEMENT PO), Take 1 capsule by mouth daily. Raw organic iron supplement-opens up and put in her shake, Disp: , Rfl:  .  glucose blood test strip, One Touch Ultra, check fsbs 5 times daily, Disp: 200  each, Rfl: 2 .  Insulin Pen Needle (NOVOFINE) 32G X 6 MM MISC, 1 each by Does not apply route 3 (three) times daily with meals., Disp: 200 each, Rfl: 1 .  Lancets (ONETOUCH ULTRASOFT) lancets, Use as instructed, Disp: 200 each, Rfl: 2 .  Lavender Oil OIL, Take by mouth., Disp: , Rfl:  .  Lemon Oil OIL, 8 drops by Does not apply route., Disp: , Rfl:  .  losartan (COZAAR) 50 MG tablet, Take 50 mg by mouth daily. ,  Disp: , Rfl:  .  Methyl Salicylate (WINTERGREEN OIL) OIL, Take 5 application by mouth. Takes 5 gtts daily as a blood thinner that does not affect kidneys , Disp: , Rfl:  .  Multiple Vitamins-Minerals (MULTIVITAMIN PO), Take 2 tablets 2 (two) times daily by mouth. Vitality Pack Supplement, Disp: , Rfl:  .  NON FORMULARY, as needed (oils used via inhaling and diffuser). , Disp: , Rfl:  .  NOVOLOG FLEXPEN 100 UNIT/ML FlexPen, INJECT 5-15 UNITS INTO THE SKIN DAILY BEFORE SUPPER. (Patient taking differently: Inject 22 Units into the skin 3 (three) times daily with meals. ), Disp: 15 mL, Rfl: 0 .  Orange Oil OIL, Take by mouth., Disp: , Rfl:  .  OVER THE COUNTER MEDICATION, Take 2 tablets 2 (two) times daily by mouth. Cellular Repair Vitality Pack Supplement, Disp: , Rfl:  .  OVER THE COUNTER MEDICATION, Take 4 capsules by mouth 2 (two) times daily. Transfer Factor (Kidney, Glucose, Regular generic), Disp: , Rfl:  .  peppermint oil liquid, Apply 1 application topically as needed (uses on skin). , Disp: , Rfl:  .  Insulin Degludec (TRESIBA FLEXTOUCH) 200 UNIT/ML SOPN, Inject 50 Units into the skin daily. (Patient not taking: Reported on 01/03/2019), Disp: 15 mL, Rfl: 0 .  Magnesium 200 MG TABS, Take 200 mg by mouth 2 (two) times daily., Disp: , Rfl:   Allergies  Allergen Reactions  . Enalapril Hives and Other (See Comments)    Angioedema face.  Samuel Germany Dye [Iodinated Diagnostic Agents] Hives  . Lisinopril Shortness Of Breath  . Shellfish Allergy Swelling and Other (See Comments)    patient  tolerates shellfish by mouth without problem    I personally reviewed active problem list, medication list, allergies, family history, social history with the patient/caregiver today.   ROS  Ten systems reviewed and is negative except as mentioned in HPI   Objective  Virtual encounter, vitals not obtained.  Body mass index is 28.57 kg/m.  Physical Exam  Awake, alert and oriented well groomed   PHQ2/9: Depression screen [facility] 2/9 01/03/2019 09/27/2018 04/20/2018 03/07/2018  Decreased Interest 0 0 0 0  Down, Depressed, Hopeless 0 0 0 0  PHQ - 2 Score 0 0 0 0  Altered sleeping 0 - 3 0  Tired, decreased energy 0 - 0 0  Change in appetite 0 - 0 0  Feeling bad or failure about yourself  0 - 0 0  Trouble concentrating 0 - 0 0  Moving slowly or fidgety/restless 0 - 0 0  Suicidal thoughts 0 - 0 0  PHQ-9 Score 0 - 3 0  Difficult doing work/chores Not difficult at all - - -   PHQ-2/9 Result is negative.    Fall Risk: Fall Risk  01/03/2019 09/27/2018 04/20/2018 03/07/2018  Falls in the past year? 0 0 No No  Number falls in past yr: 0 - - -  Injury with Fall? 0 - - -     Assessment & Plan  1. Diabetes 1.5, managed as type 1 ([place])  - glucagon (GLUCAGON EMERGENCY) 1 MG injection; Inject 1 mg into the vein once as needed for up to 1 dose.  Dispense: 1 each; Refill: 2  2. End stage renal disease on dialysis ([place])   3. Type 1 diabetes mellitus with chronic kidney disease on chronic dialysis ([facility])  - Lipid panel - Hemoglobin A1c - Hepatic function panel - insulin aspart (NOVOLOG FLEXPEN) 100 UNIT/ML FlexPen; Inject 10-22 Units into the skin 3 (  three) times daily with meals.  Dispense: 15 mL; Refill: 2  2. End stage renal disease on dialysis ([place])   3. Type 1 diabetes mellitus with chronic kidney disease on chronic dialysis (HCC)  - Lipid panel - Hemoglobin A1c - Hepatic function panel - insulin aspart (NOVOLOG FLEXPEN) 100 UNIT/ML FlexPen; Inject 10-22 Units into the skin 3  (three) times daily with meals.  Dispense: 15 mL; Refill: 2  4. Secondary hyperparathyroidism of renal origin [facility])  Under the care of Dr. Holley Raring   5. Dyslipidemia  Diabetes is better controlled, hopefully lipid panel will also be better  I provided 25  minutes of non-face-to-face time during this encounter.

## 2019-01-03 NOTE — Patient Instructions (Addendum)
Novolog 70/30 Humalog 75/25  Please research and see if willing to try it   Consider asking nephrologist if okay to give you metformin now that you are in dialysis

## 2019-01-03 NOTE — Telephone Encounter (Signed)
Patient was advised per Dr. Ancil Boozer she really needs at least her A1C and BP checked by driving up to our building. We are able to come to her with gloves, mask and all proper PPE while taking these measurements. Dr. Ancil Boozer states she is unable to properly care for her DM and HTN without these readings since her levels are not controlled. Patient states "she does not feel safe and does not want to come in or drive up right now". Patient was unset and states she will be finding a new provider.

## 2019-03-08 ENCOUNTER — Telehealth: Payer: Self-pay

## 2019-03-08 ENCOUNTER — Telehealth (INDEPENDENT_AMBULATORY_CARE_PROVIDER_SITE_OTHER): Payer: Self-pay

## 2019-03-08 NOTE — Telephone Encounter (Signed)
I attempted to contact the patient regarding have a permcath exchange and a message was left for a return call.

## 2019-03-09 ENCOUNTER — Telehealth: Payer: Self-pay

## 2019-03-09 NOTE — Telephone Encounter (Signed)
Patient called back and I attempted to schedule her for today with Dr. Lucky Cowboy, the patient would need to have Covid testing done at the [facility] at 12:30 pm and be at the [facility] today at 3:30 pm to have her permcath exchange per the paperwork that was faxed over yesterday from Dr. Elwyn Lade office. Patient stated that she was only to have her cath flushed, patient requested that it be with Dr. Lucky Cowboy. The patient stated she could not do all of this, she stated she was going to call her husband and call back.

## 2019-03-09 NOTE — Telephone Encounter (Signed)
[facility] called and wanted to have patient seen today regarding a clotted [facility] - they wanted to get her in today for a visit.   No openings for a visit today or tomorrow - spoke with Zigmund Daniel and she said that they first needed to call IR and have them determine if they can declot before they change the catheter out.   York Cerise, CMA

## 2019-03-09 NOTE — Telephone Encounter (Signed)
Opened in error

## 2019-03-09 NOTE — Telephone Encounter (Signed)
I attempted to contact the patient again to schedule her for a permcath exchange for today and a message was left for a return call to [phone].

## 2019-04-04 ENCOUNTER — Encounter: Payer: Self-pay | Admitting: Family Medicine

## 2019-04-04 ENCOUNTER — Other Ambulatory Visit: Payer: Self-pay

## 2019-04-04 ENCOUNTER — Encounter: Payer: Medicare Other | Admitting: Family Medicine

## 2019-04-05 NOTE — Progress Notes (Signed)
Not seen

## 2019-04-17 ENCOUNTER — Other Ambulatory Visit: Payer: Self-pay | Admitting: Family Medicine

## 2019-04-17 DIAGNOSIS — E1342 Other specified diabetes mellitus with diabetic polyneuropathy: Secondary | ICD-10-CM

## 2019-04-17 DIAGNOSIS — Z992 Dependence on renal dialysis: Secondary | ICD-10-CM

## 2019-04-17 DIAGNOSIS — E1022 Type 1 diabetes mellitus with diabetic chronic kidney disease: Secondary | ICD-10-CM

## 2019-04-17 DIAGNOSIS — E1122 Type 2 diabetes mellitus with diabetic chronic kidney disease: Secondary | ICD-10-CM

## 2019-04-17 DIAGNOSIS — E1169 Type 2 diabetes mellitus with other specified complication: Secondary | ICD-10-CM

## 2019-04-17 DIAGNOSIS — N185 Chronic kidney disease, stage 5: Secondary | ICD-10-CM

## 2019-05-02 ENCOUNTER — Ambulatory Visit: Payer: Medicare Other | Admitting: Family Medicine

## 2020-10-16 ENCOUNTER — Encounter (INDEPENDENT_AMBULATORY_CARE_PROVIDER_SITE_OTHER): Payer: BLUE CROSS/BLUE SHIELD | Admitting: Ophthalmology

## 2020-10-17 ENCOUNTER — Encounter (INDEPENDENT_AMBULATORY_CARE_PROVIDER_SITE_OTHER): Payer: Medicare Other | Admitting: Ophthalmology

## 2020-10-17 ENCOUNTER — Other Ambulatory Visit: Payer: Self-pay

## 2020-10-17 ENCOUNTER — Encounter (INDEPENDENT_AMBULATORY_CARE_PROVIDER_SITE_OTHER): Payer: Self-pay

## 2021-09-28 ENCOUNTER — Inpatient Hospital Stay
Admission: EM | Admit: 2021-09-28 | Discharge: 2021-10-06 | DRG: 521 | Disposition: A | Discharge status: Skilled Nursing Facility | Payer: Medicare Other | Attending: Family Medicine | Admitting: Family Medicine

## 2021-09-28 ENCOUNTER — Emergency Department: Payer: Medicare Other

## 2021-09-28 ENCOUNTER — Other Ambulatory Visit: Payer: Self-pay

## 2021-09-28 ENCOUNTER — Encounter: Payer: Self-pay | Admitting: Emergency Medicine

## 2021-09-28 DIAGNOSIS — M80052A Age-related osteoporosis with current pathological fracture, left femur, initial encounter for fracture: Secondary | ICD-10-CM | POA: Diagnosis present

## 2021-09-28 DIAGNOSIS — D631 Anemia in chronic kidney disease: Secondary | ICD-10-CM | POA: Diagnosis present

## 2021-09-28 DIAGNOSIS — Z809 Family history of malignant neoplasm, unspecified: Secondary | ICD-10-CM | POA: Diagnosis not present

## 2021-09-28 DIAGNOSIS — T82590A Other mechanical complication of surgically created arteriovenous fistula, initial encounter: Secondary | ICD-10-CM | POA: Diagnosis not present

## 2021-09-28 DIAGNOSIS — E109 Type 1 diabetes mellitus without complications: Secondary | ICD-10-CM

## 2021-09-28 DIAGNOSIS — E1022 Type 1 diabetes mellitus with diabetic chronic kidney disease: Secondary | ICD-10-CM | POA: Diagnosis present

## 2021-09-28 DIAGNOSIS — W19XXXA Unspecified fall, initial encounter: Secondary | ICD-10-CM | POA: Diagnosis present

## 2021-09-28 DIAGNOSIS — I2510 Atherosclerotic heart disease of native coronary artery without angina pectoris: Secondary | ICD-10-CM | POA: Diagnosis present

## 2021-09-28 DIAGNOSIS — R609 Edema, unspecified: Secondary | ICD-10-CM | POA: Diagnosis not present

## 2021-09-28 DIAGNOSIS — Z794 Long term (current) use of insulin: Secondary | ICD-10-CM

## 2021-09-28 DIAGNOSIS — E1140 Type 2 diabetes mellitus with diabetic neuropathy, unspecified: Secondary | ICD-10-CM | POA: Diagnosis present

## 2021-09-28 DIAGNOSIS — R34 Anuria and oliguria: Secondary | ICD-10-CM | POA: Diagnosis present

## 2021-09-28 DIAGNOSIS — S72002S Fracture of unspecified part of neck of left femur, sequela: Secondary | ICD-10-CM | POA: Diagnosis not present

## 2021-09-28 DIAGNOSIS — Z28310 Unvaccinated for covid-19: Secondary | ICD-10-CM | POA: Diagnosis not present

## 2021-09-28 DIAGNOSIS — N186 End stage renal disease: Secondary | ICD-10-CM

## 2021-09-28 DIAGNOSIS — Z888 Allergy status to other drugs, medicaments and biological substances status: Secondary | ICD-10-CM

## 2021-09-28 DIAGNOSIS — Y711 Therapeutic (nonsurgical) and rehabilitative cardiovascular devices associated with adverse incidents: Secondary | ICD-10-CM | POA: Diagnosis not present

## 2021-09-28 DIAGNOSIS — M722 Plantar fascial fibromatosis: Secondary | ICD-10-CM | POA: Diagnosis not present

## 2021-09-28 DIAGNOSIS — E7800 Pure hypercholesterolemia, unspecified: Secondary | ICD-10-CM | POA: Diagnosis present

## 2021-09-28 DIAGNOSIS — Z992 Dependence on renal dialysis: Secondary | ICD-10-CM | POA: Diagnosis not present

## 2021-09-28 DIAGNOSIS — E669 Obesity, unspecified: Secondary | ICD-10-CM | POA: Diagnosis present

## 2021-09-28 DIAGNOSIS — E785 Hyperlipidemia, unspecified: Secondary | ICD-10-CM | POA: Diagnosis not present

## 2021-09-28 DIAGNOSIS — S72002A Fracture of unspecified part of neck of left femur, initial encounter for closed fracture: Secondary | ICD-10-CM

## 2021-09-28 DIAGNOSIS — S76111A Strain of right quadriceps muscle, fascia and tendon, initial encounter: Secondary | ICD-10-CM | POA: Diagnosis not present

## 2021-09-28 DIAGNOSIS — Z8249 Family history of ischemic heart disease and other diseases of the circulatory system: Secondary | ICD-10-CM | POA: Diagnosis not present

## 2021-09-28 DIAGNOSIS — Z20822 Contact with and (suspected) exposure to covid-19: Secondary | ICD-10-CM | POA: Diagnosis present

## 2021-09-28 DIAGNOSIS — Z79899 Other long term (current) drug therapy: Secondary | ICD-10-CM

## 2021-09-28 DIAGNOSIS — M84452A Pathological fracture, left femur, initial encounter for fracture: Secondary | ICD-10-CM | POA: Diagnosis present

## 2021-09-28 DIAGNOSIS — N2581 Secondary hyperparathyroidism of renal origin: Secondary | ICD-10-CM | POA: Diagnosis present

## 2021-09-28 DIAGNOSIS — Y92019 Unspecified place in single-family (private) house as the place of occurrence of the external cause: Secondary | ICD-10-CM

## 2021-09-28 DIAGNOSIS — S72142D Displaced intertrochanteric fracture of left femur, subsequent encounter for closed fracture with routine healing: Secondary | ICD-10-CM | POA: Diagnosis not present

## 2021-09-28 DIAGNOSIS — S72102A Unspecified trochanteric fracture of left femur, initial encounter for closed fracture: Secondary | ICD-10-CM

## 2021-09-28 DIAGNOSIS — E876 Hypokalemia: Secondary | ICD-10-CM | POA: Diagnosis not present

## 2021-09-28 DIAGNOSIS — D649 Anemia, unspecified: Secondary | ICD-10-CM | POA: Diagnosis not present

## 2021-09-28 DIAGNOSIS — Z811 Family history of alcohol abuse and dependence: Secondary | ICD-10-CM

## 2021-09-28 DIAGNOSIS — D62 Acute posthemorrhagic anemia: Secondary | ICD-10-CM | POA: Diagnosis not present

## 2021-09-28 DIAGNOSIS — E1051 Type 1 diabetes mellitus with diabetic peripheral angiopathy without gangrene: Secondary | ICD-10-CM | POA: Diagnosis present

## 2021-09-28 DIAGNOSIS — E1169 Type 2 diabetes mellitus with other specified complication: Secondary | ICD-10-CM | POA: Diagnosis not present

## 2021-09-28 DIAGNOSIS — Z825 Family history of asthma and other chronic lower respiratory diseases: Secondary | ICD-10-CM

## 2021-09-28 DIAGNOSIS — E1042 Type 1 diabetes mellitus with diabetic polyneuropathy: Secondary | ICD-10-CM | POA: Diagnosis present

## 2021-09-28 DIAGNOSIS — Z8673 Personal history of transient ischemic attack (TIA), and cerebral infarction without residual deficits: Secondary | ICD-10-CM | POA: Diagnosis not present

## 2021-09-28 DIAGNOSIS — I120 Hypertensive chronic kidney disease with stage 5 chronic kidney disease or end stage renal disease: Secondary | ICD-10-CM | POA: Diagnosis present

## 2021-09-28 DIAGNOSIS — M62838 Other muscle spasm: Secondary | ICD-10-CM | POA: Diagnosis not present

## 2021-09-28 DIAGNOSIS — Z91041 Radiographic dye allergy status: Secondary | ICD-10-CM

## 2021-09-28 DIAGNOSIS — Z91013 Allergy to seafood: Secondary | ICD-10-CM

## 2021-09-28 DIAGNOSIS — S7292XA Unspecified fracture of left femur, initial encounter for closed fracture: Secondary | ICD-10-CM | POA: Diagnosis present

## 2021-09-28 DIAGNOSIS — Z833 Family history of diabetes mellitus: Secondary | ICD-10-CM

## 2021-09-28 DIAGNOSIS — Z6830 Body mass index (BMI) 30.0-30.9, adult: Secondary | ICD-10-CM

## 2021-09-28 DIAGNOSIS — E875 Hyperkalemia: Secondary | ICD-10-CM | POA: Diagnosis not present

## 2021-09-28 DIAGNOSIS — S79002S Unspecified physeal fracture of upper end of left femur, sequela: Secondary | ICD-10-CM | POA: Diagnosis not present

## 2021-09-28 DIAGNOSIS — I10 Essential (primary) hypertension: Secondary | ICD-10-CM | POA: Diagnosis present

## 2021-09-28 DIAGNOSIS — M25559 Pain in unspecified hip: Secondary | ICD-10-CM

## 2021-09-28 DIAGNOSIS — T82898A Other specified complication of vascular prosthetic devices, implants and grafts, initial encounter: Secondary | ICD-10-CM | POA: Diagnosis not present

## 2021-09-28 DIAGNOSIS — M25552 Pain in left hip: Secondary | ICD-10-CM | POA: Diagnosis present

## 2021-09-28 DIAGNOSIS — Z419 Encounter for procedure for purposes other than remedying health state, unspecified: Secondary | ICD-10-CM

## 2021-09-28 DIAGNOSIS — E1142 Type 2 diabetes mellitus with diabetic polyneuropathy: Secondary | ICD-10-CM | POA: Diagnosis not present

## 2021-09-28 LAB — CBC WITH DIFFERENTIAL/PLATELET
Abs Immature Granulocytes: 0.06 10*3/uL (ref 0.00–0.07)
Basophils Absolute: 0 10*3/uL (ref 0.0–0.1)
Basophils Relative: 1 %
Eosinophils Absolute: 0.1 10*3/uL (ref 0.0–0.5)
Eosinophils Relative: 2 %
HCT: 31.3 % — ABNORMAL LOW (ref 36.0–46.0)
Hemoglobin: 10 g/dL — ABNORMAL LOW (ref 12.0–15.0)
Immature Granulocytes: 1 %
Lymphocytes Relative: 14 %
Lymphs Abs: 0.6 10*3/uL — ABNORMAL LOW (ref 0.7–4.0)
MCH: 32.4 pg (ref 26.0–34.0)
MCHC: 31.9 g/dL (ref 30.0–36.0)
MCV: 101.3 fL — ABNORMAL HIGH (ref 80.0–100.0)
Monocytes Absolute: 0.4 10*3/uL (ref 0.1–1.0)
Monocytes Relative: 8 %
Neutro Abs: 3.4 10*3/uL (ref 1.7–7.7)
Neutrophils Relative %: 74 %
Platelets: 168 10*3/uL (ref 150–400)
RBC: 3.09 MIL/uL — ABNORMAL LOW (ref 3.87–5.11)
RDW: 13.3 % (ref 11.5–15.5)
WBC: 4.6 10*3/uL (ref 4.0–10.5)
nRBC: 0 % (ref 0.0–0.2)

## 2021-09-28 LAB — BASIC METABOLIC PANEL
Anion gap: 16 — ABNORMAL HIGH (ref 5–15)
BUN: 59 mg/dL — ABNORMAL HIGH (ref 6–20)
CO2: 27 mmol/L (ref 22–32)
Calcium: 10 mg/dL (ref 8.9–10.3)
Chloride: 94 mmol/L — ABNORMAL LOW (ref 98–111)
Creatinine, Ser: 7.75 mg/dL — ABNORMAL HIGH (ref 0.44–1.00)
GFR, Estimated: 6 mL/min — ABNORMAL LOW (ref >60)
Glucose, Bld: 121 mg/dL — ABNORMAL HIGH (ref 70–99)
Potassium: 4.5 mmol/L (ref 3.5–5.1)
Sodium: 137 mmol/L (ref 135–145)

## 2021-09-28 LAB — SAMPLE TO BLOOD BANK

## 2021-09-28 LAB — RESP PANEL BY RT-PCR (FLU A&B, COVID) ARPGX2
Influenza A by PCR: NEGATIVE
Influenza B by PCR: NEGATIVE
SARS Coronavirus 2 by RT PCR: NEGATIVE

## 2021-09-28 LAB — CBG MONITORING, ED: Glucose-Capillary: 203 mg/dL — ABNORMAL HIGH (ref 70–99)

## 2021-09-28 IMAGING — CR DG HIP (WITH OR WITHOUT PELVIS) 2-3V*L*
3 series · 3 of 3 positions shown · non-contrast
Comparison: None.

CLINICAL DATA: Fall

EXAM:
DG HIP (WITH OR WITHOUT PELVIS) 2-3V LEFT

[pelvis ap]
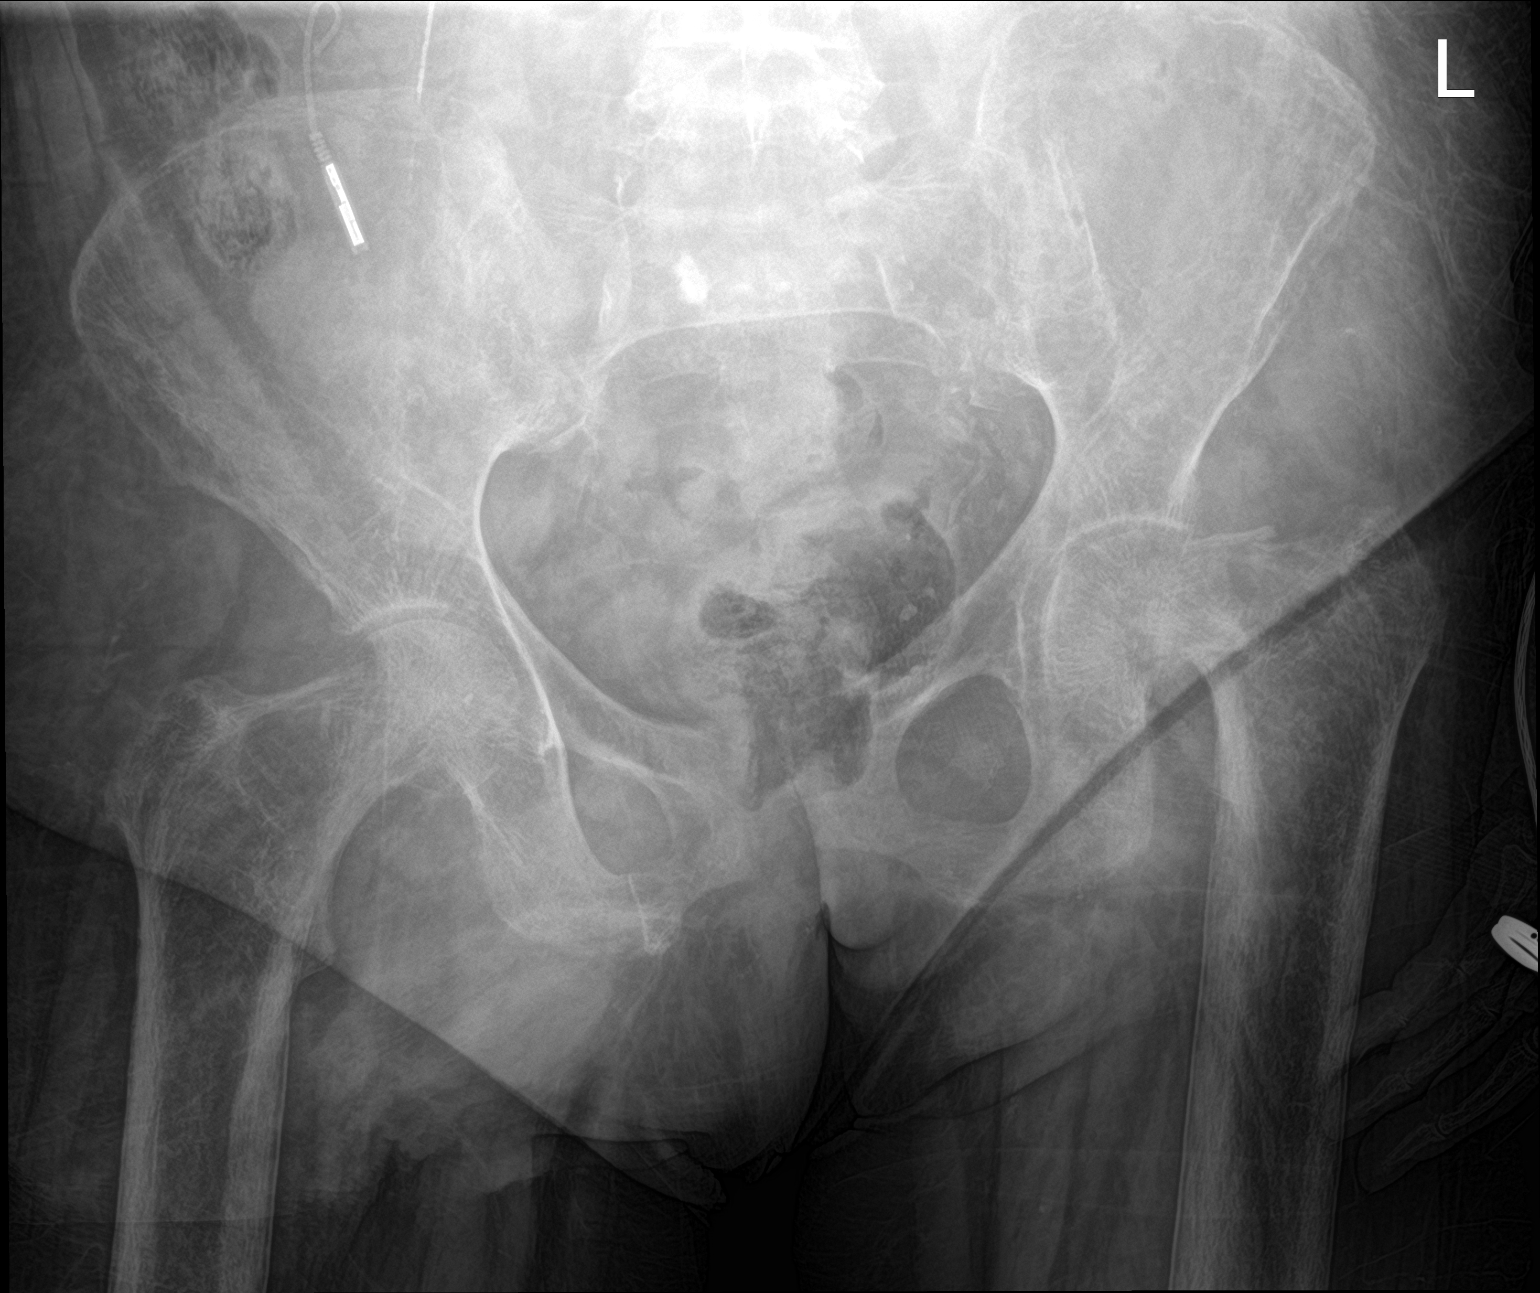

[hip ap]
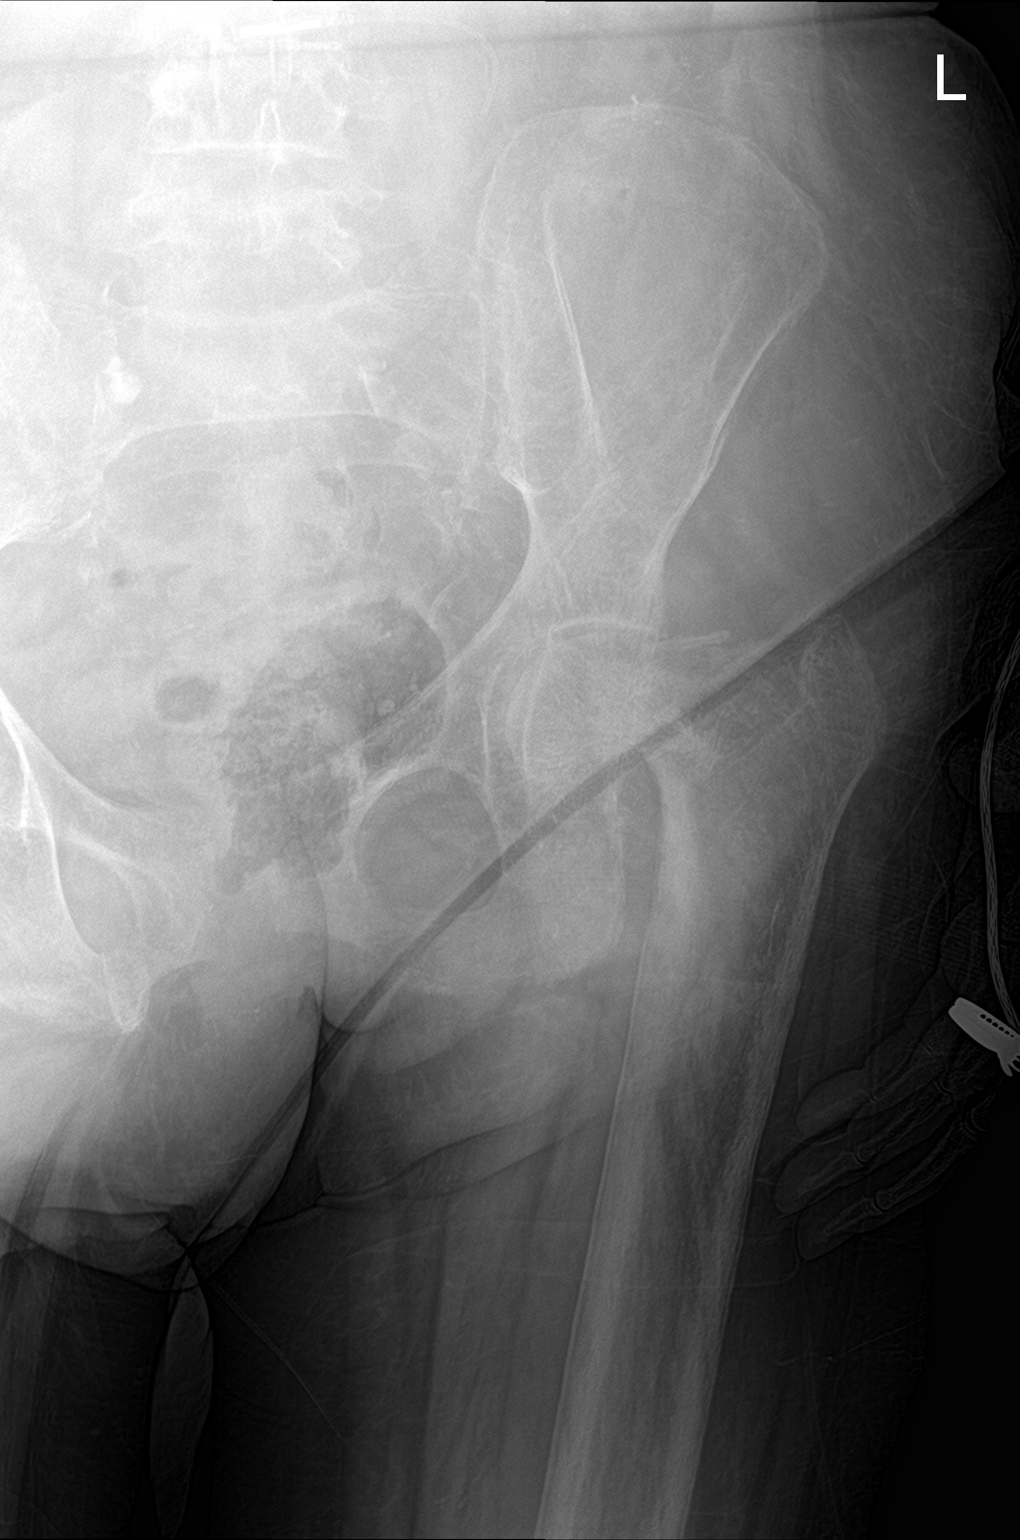

[hip lat]
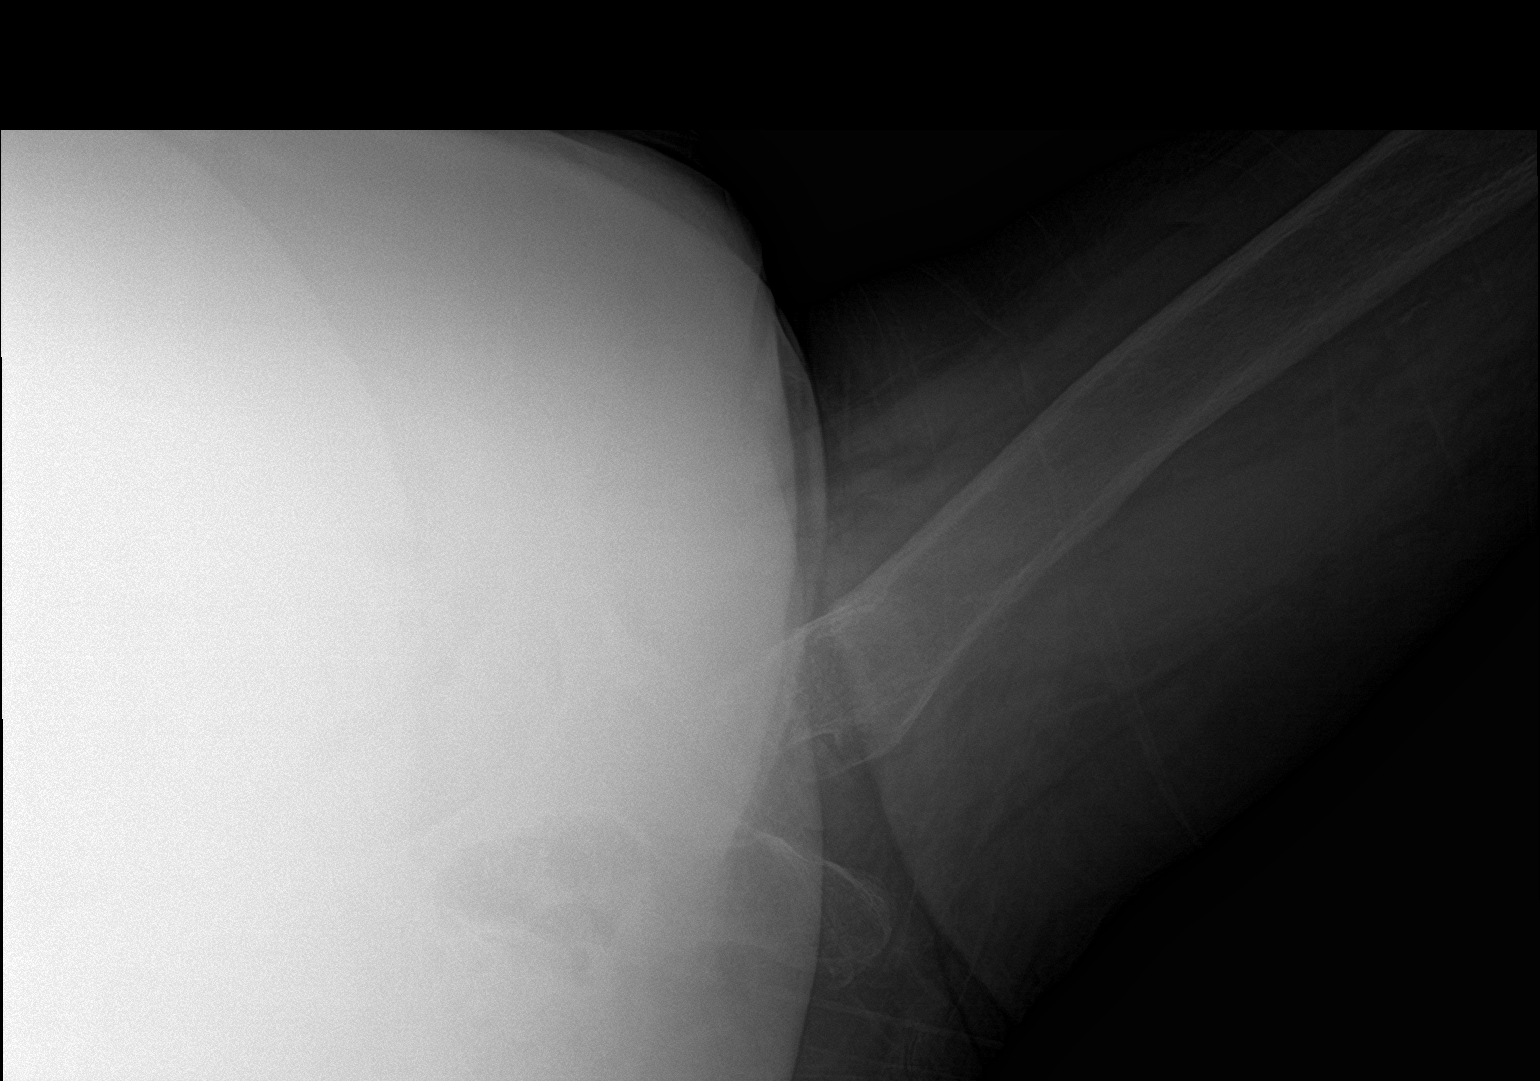

[3 of 3 positions shown; findings below may reference images not displayed]

FINDINGS: Osteopenia. There is a displaced fracture through the neck and
intratrochanteric LEFT femur with superior translocation of the
distal femur. Femoral head appears to be seated within the
acetabulum although evaluation is limited by body habitus. No
additional fracture noted. Limited assessment of the sacrum
secondary to overlapping bowel contents and profound osteopenia.
IMPRESSION: Displaced foreshortened fracture of the neck and inter trochanteric
LEFT femur.

## 2021-09-28 IMAGING — CT CT HIP*L* W/O CM
2 of 6 series · 13 of 46 positions shown, 18 images · non-contrast
Comparison: X-ray [DATE], CT [DATE]

CLINICAL DATA: Fall, left hip fracture

EXAM:
CT OF THE LEFT HIP WITHOUT CONTRAST
TECHNIQUE: Multidetector CT imaging of the left hip was performed according to
the standard protocol. Multiplanar CT image reconstructions were
also generated.
RADIATION DOSE REDUCTION: This exam was performed according to the
departmental dose-optimization program which includes automated
exposure control, adjustment of the mA and/or kV according to
patient size and/or use of iterative reconstruction technique.

[Series 3: axial st · axial · 0.44mm/px · z∈[-1258,-1042]mm · 10 of 126 slices shown, 15 images]
[im 9/126  soft-tissue]
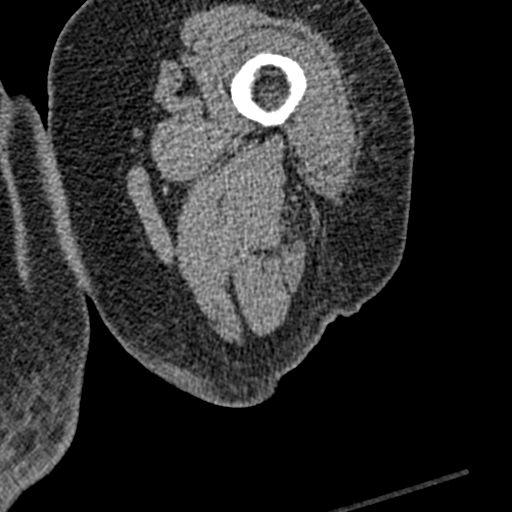
[im 9/126  bone]
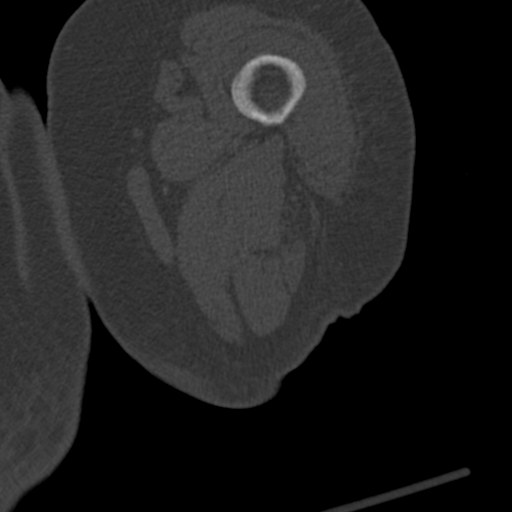
[im 26/126  soft-tissue]
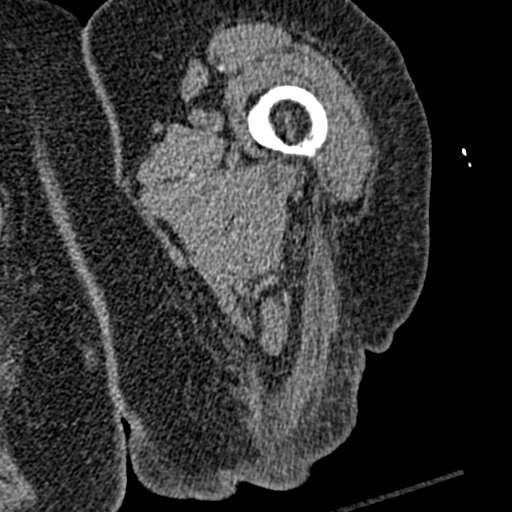
[im 34/126  soft-tissue]
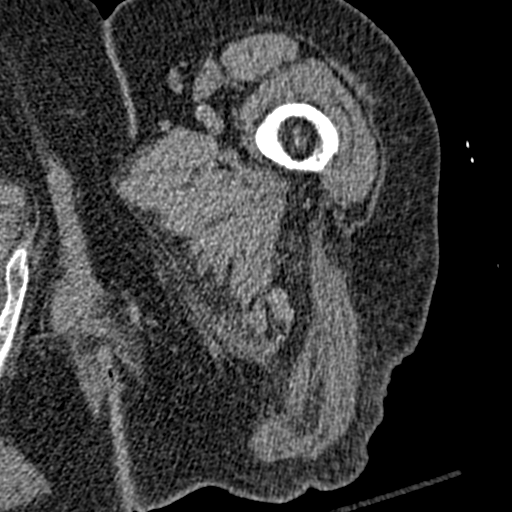
[im 51/126  soft-tissue]
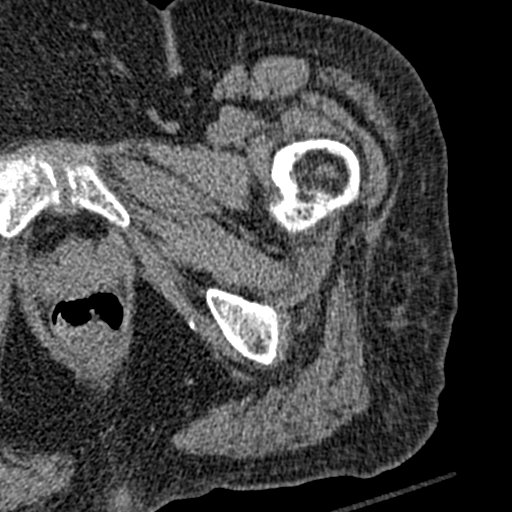
[im 67/126  soft-tissue]
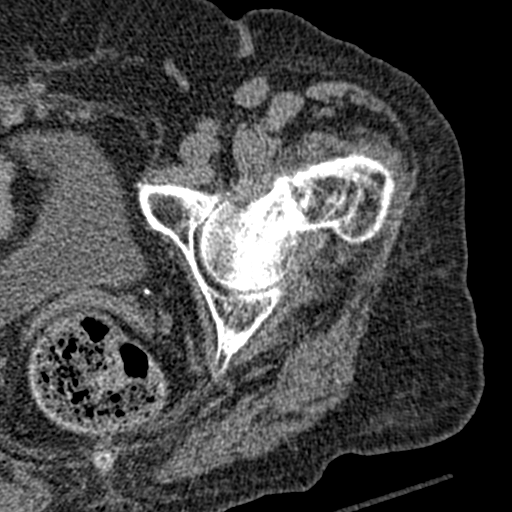
[im 76/126  soft-tissue]
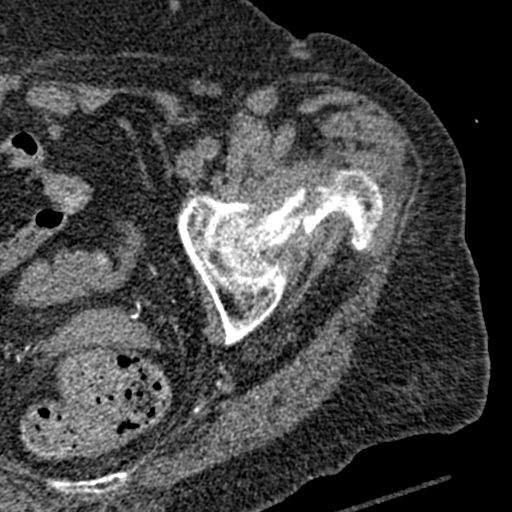
[im 92/126  soft-tissue]
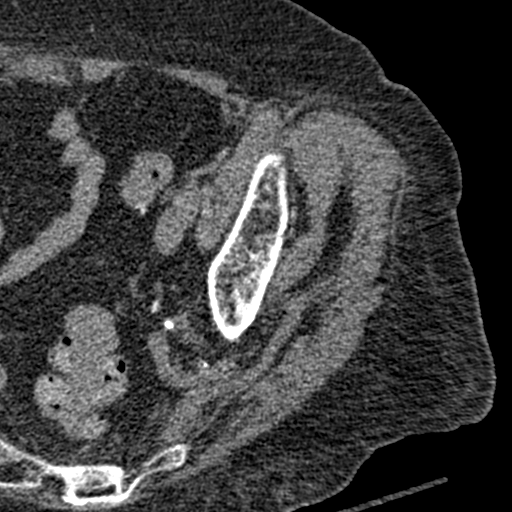
[im 92/126  lung]
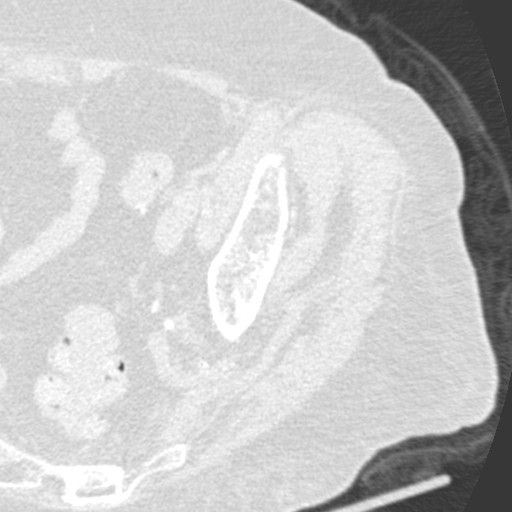
[im 101/126  soft-tissue]
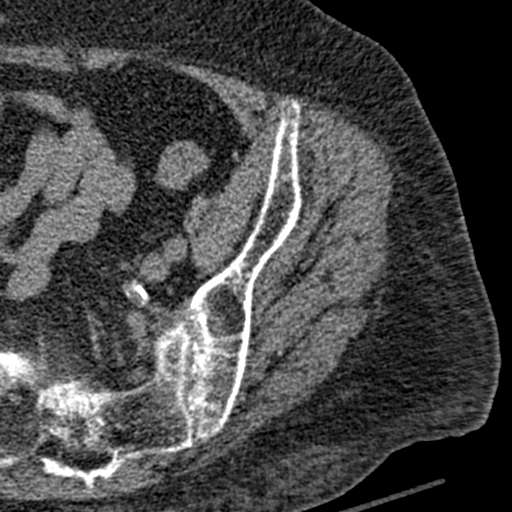
[im 101/126  lung]
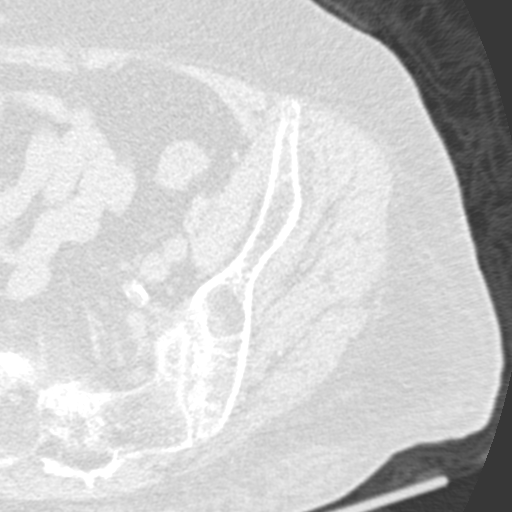
[im 109/126  lung]
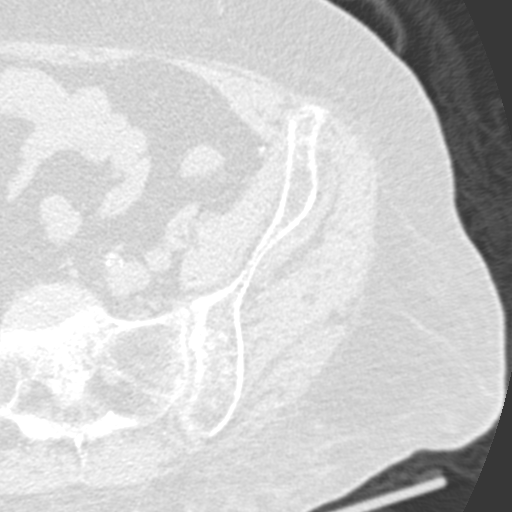
[im 117/126  soft-tissue]
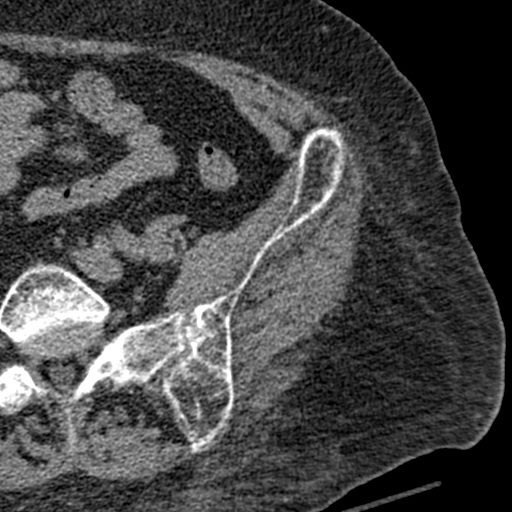
[im 117/126  lung]
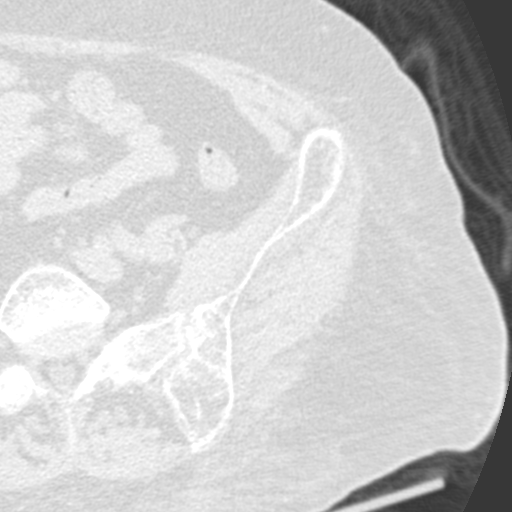
[im 117/126  bone]
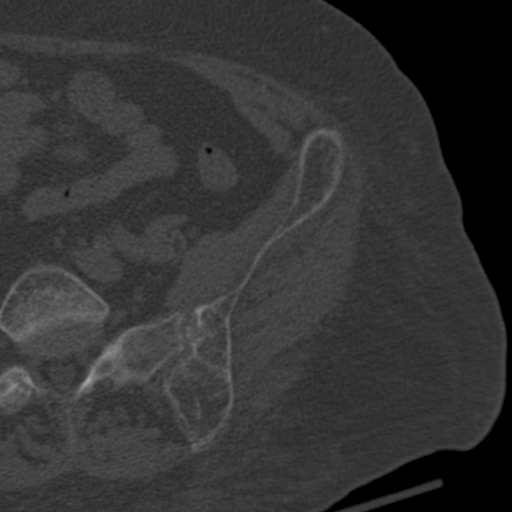

[Series 8: coronal st · coronal · 0.46mm/px · 3 of 145 slices shown]
[im 29/145  soft-tissue]
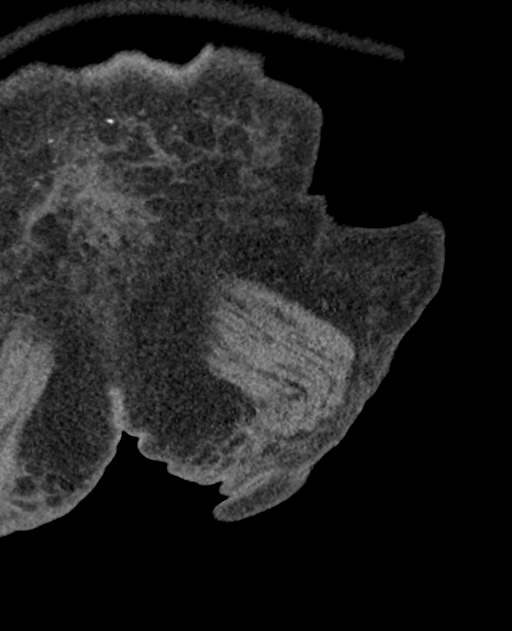
[im 58/145  soft-tissue]
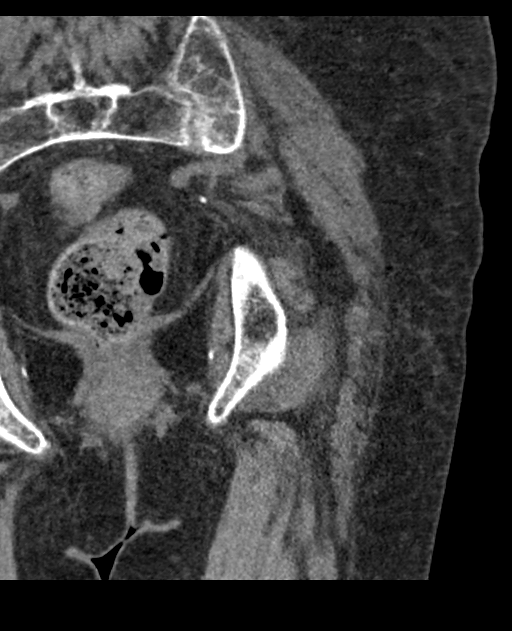
[im 87/145  soft-tissue]
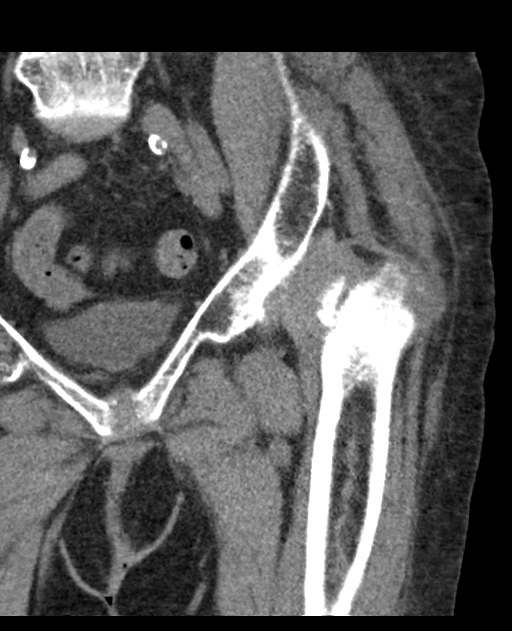

[13 of 46 positions shown; findings below may reference images not displayed]

FINDINGS: Bones/Joint/Cartilage

Marked diffuse osteopenia. Acute transcervical fracture through the
left femoral neck with significant varus angulation. No definite
intertrochanteric involvement. Hip joint alignment is maintained
without dislocation. Acute nondisplaced fracture of the right
inferior pubic ramus (series 2, image 91). No additional fractures
are identified. Moderate arthropathy of the pubic symphysis with new
erosive or resorptive changes of the bilateral pubic bones.
Symphysis is mildly widened compared to previous CT.

Ligaments

Suboptimally assessed by CT.

Muscles and Tendons

No acute musculotendinous abnormality by CT.

Soft tissues

Mild soft tissue swelling at the femoral neck fracture site. No
organized hematoma. No acute findings within the visualized portion
of the left hemipelvis.
IMPRESSION: 1. Acute transcervical fracture through the LEFT femoral neck with
varus angulation.
2. Acute nondisplaced fracture of the RIGHT inferior pubic ramus.
3. Moderate arthropathy of the pubic symphysis with new resorptive
or erosive changes of the bilateral pubic bones. This may be
secondary to underlying renal osteodystrophy. Septic arthritis could
have this appearance in the appropriate clinical setting. Mild
widening at the pubic symphysis is favored secondary to underlying
arthropathy/resorption rather than posttraumatic diastasis.
4. Marked bony demineralization.

## 2021-09-28 IMAGING — CR DG CHEST 1V PORT
1 series · 1 of 1 positions shown · non-contrast
Comparison: None.

CLINICAL DATA: Fall

EXAM:
PORTABLE CHEST 1 VIEW

[chest ap]
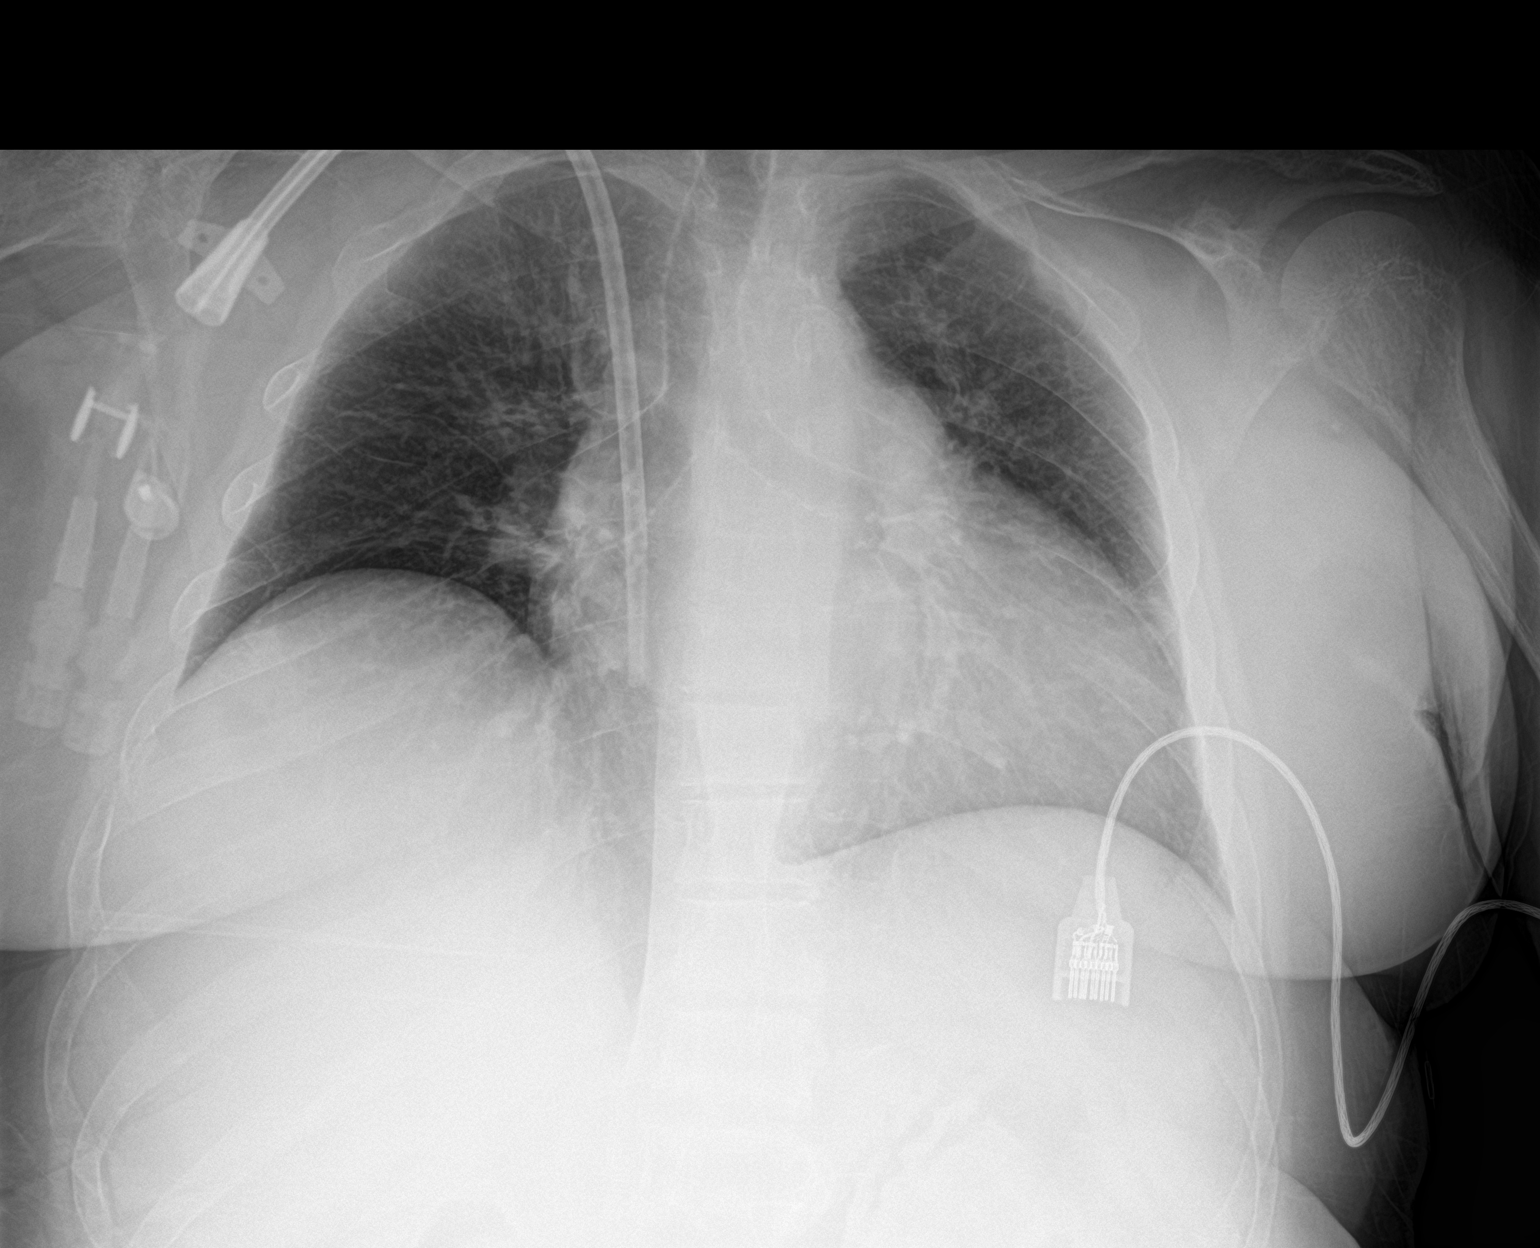

[1 of 1 positions shown; findings below may reference images not displayed]

FINDINGS: The cardiomediastinal silhouette is enlarged in contour.RIGHT chest
CVC tip terminating over the superior cavoatrial junction. Enlarged
appearance of bilateral hilar contours most consistent with enlarged
pulmonary arteries and likely underlying pulmonary arterial
hypertension. Elevation of the RIGHT hemidiaphragm. No pleural
effusion. No pneumothorax. No acute pleuroparenchymal abnormality.
Visualized abdomen is unremarkable. Osteopenia.
IMPRESSION: 1. Cardiomegaly with enlarged hilar contours likely reflecting
enlarged pulmonary arteries and underlying pulmonary arterial
hypertension.

## 2021-09-28 MED ORDER — FERRIC CITRATE 1 GM 210 MG(FE) PO TABS: 210.0000 mg | ORAL_TABLET | ORAL | Status: DC

## 2021-09-28 MED ORDER — MELATONIN 5 MG PO TABS: 5.0000 mg | ORAL_TABLET | Freq: Every evening | ORAL | Status: DC | PRN

## 2021-09-28 MED ORDER — HEPARIN SODIUM (PORCINE) 5000 UNIT/ML IJ SOLN
5000.0000 [IU] | Freq: Three times a day (TID) | INTRAMUSCULAR | Status: AC
  Administered 2021-09-28: 5000 [IU] via SUBCUTANEOUS
  Filled 2021-09-28: qty 1

## 2021-09-28 MED ORDER — INSULIN ASPART 100 UNIT/ML IJ SOLN
0.0000 [IU] | Freq: Every day | INTRAMUSCULAR | Status: DC
  Administered 2021-09-28 – 2021-10-02 (×3): 2 [IU] via SUBCUTANEOUS
  Administered 2021-10-03: 3 [IU] via SUBCUTANEOUS
  Filled 2021-09-28 (×4): qty 1

## 2021-09-28 MED ORDER — MAGNESIUM OXIDE -MG SUPPLEMENT 400 (240 MG) MG PO TABS
200.0000 mg | ORAL_TABLET | Freq: Two times a day (BID) | ORAL | Status: DC
  Administered 2021-09-28 – 2021-10-05 (×14): 200 mg via ORAL
  Filled 2021-09-28 (×15): qty 1

## 2021-09-28 MED ORDER — HYDROCODONE-ACETAMINOPHEN 5-325 MG PO TABS
1.0000 | ORAL_TABLET | Freq: Four times a day (QID) | ORAL | Status: DC | PRN
  Administered 2021-09-28: 2 via ORAL
  Filled 2021-09-28: qty 2

## 2021-09-28 MED ORDER — LOSARTAN POTASSIUM 50 MG PO TABS
50.0000 mg | ORAL_TABLET | Freq: Every day | ORAL | Status: DC
  Administered 2021-09-28 – 2021-09-29 (×2): 50 mg via ORAL
  Filled 2021-09-28 (×2): qty 1

## 2021-09-28 MED ORDER — FERRIC CITRATE 1 GM 210 MG(FE) PO TABS
210.0000 mg | ORAL_TABLET | ORAL | Status: DC
  Administered 2021-10-01 – 2021-10-05 (×10): 210 mg via ORAL
  Filled 2021-09-28 (×25): qty 1

## 2021-09-28 MED ORDER — AMLODIPINE BESYLATE 5 MG PO TABS
5.0000 mg | ORAL_TABLET | Freq: Two times a day (BID) | ORAL | Status: DC
  Filled 2021-09-28: qty 1

## 2021-09-28 MED ORDER — LABETALOL HCL 5 MG/ML IV SOLN
5.0000 mg | INTRAVENOUS | Status: DC | PRN
  Administered 2021-09-28: 5 mg via INTRAVENOUS
  Filled 2021-09-28: qty 4

## 2021-09-28 MED ORDER — CYCLOBENZAPRINE HCL 10 MG PO TABS
5.0000 mg | ORAL_TABLET | Freq: Three times a day (TID) | ORAL | Status: AC | PRN
  Administered 2021-09-28 (×2): 5 mg via ORAL
  Filled 2021-09-28 (×2): qty 1

## 2021-09-28 MED ORDER — HYDROMORPHONE HCL 1 MG/ML IJ SOLN
0.5000 mg | Freq: Once | INTRAMUSCULAR | Status: AC
  Administered 2021-09-28: 0.5 mg via INTRAVENOUS
  Filled 2021-09-28: qty 1

## 2021-09-28 MED ORDER — FERRIC CITRATE 1 GM 210 MG(FE) PO TABS
420.0000 mg | ORAL_TABLET | Freq: Three times a day (TID) | ORAL | Status: DC
  Administered 2021-09-30 – 2021-10-06 (×13): 420 mg via ORAL
  Filled 2021-09-28 (×24): qty 2

## 2021-09-28 MED ORDER — MORPHINE SULFATE (PF) 4 MG/ML IV SOLN
4.0000 mg | Freq: Once | INTRAVENOUS | Status: AC
  Administered 2021-09-28: 4 mg via INTRAVENOUS
  Filled 2021-09-28: qty 1

## 2021-09-28 MED ORDER — INSULIN ASPART 100 UNIT/ML IJ SOLN
0.0000 [IU] | Freq: Three times a day (TID) | INTRAMUSCULAR | Status: DC
  Administered 2021-09-29: 1 [IU] via SUBCUTANEOUS
  Administered 2021-09-30: 2 [IU] via SUBCUTANEOUS
  Administered 2021-09-30 (×2): 1 [IU] via SUBCUTANEOUS
  Administered 2021-10-01: 3 [IU] via SUBCUTANEOUS
  Administered 2021-10-02: 1 [IU] via SUBCUTANEOUS
  Administered 2021-10-02: 2 [IU] via SUBCUTANEOUS
  Administered 2021-10-02 – 2021-10-04 (×4): 1 [IU] via SUBCUTANEOUS
  Administered 2021-10-04 – 2021-10-06 (×3): 2 [IU] via SUBCUTANEOUS
  Filled 2021-09-28 (×14): qty 1

## 2021-09-28 MED ORDER — MORPHINE SULFATE (PF) 2 MG/ML IV SOLN
0.5000 mg | INTRAVENOUS | Status: DC | PRN
  Administered 2021-09-28: 0.5 mg via INTRAVENOUS
  Filled 2021-09-28: qty 1

## 2021-09-28 NOTE — Assessment & Plan Note (Addendum)
-   Check vitamin D, B12, TSH levels - Orthopedic service has been consulted, appreciate further recommendation - Patient currently has n.p.o. orders after midnight except for sips with meds - Supportive management with pain control: Norco 5-325 p.o., every 6 hours as needed for moderate pain and morphine 0.5 mg IV every 2 hours as needed for for severe pain

## 2021-09-28 NOTE — ED Provider Notes (Signed)
[facility] Provider Note    Event Date/Time   First MD Initiated Contact with Patient 09/28/21 1548     (approximate)   History   Chief Complaint Hip Pain   HPI  [name] is a 56 y.o. female with past medical history of hypertension, hyperlipidemia, diabetes, and ESRD on HD (MWF) who presents to the ED complaining of hip pain.  Patient reports that a couple of days ago she twisted her left leg in bed and felt a pull over her left medial thigh.  She has been dealing with some mild pain in that area since then, today was using her walker to get to the bathroom when that leg seemed to give out and she fell to the ground.  She reports hitting her left hip and right chest wall, denies hitting her head.  She has had significant pain in her left hip since then and significant pain when trying to bear weight on her left leg.  She has been unable to walk since the fall and complains of pain in her right chest wall.  She denies any pain in her right leg, upper extremities, head, neck, or abdomen.  She does not take any blood thinners.  She reports that she is currently training for home hemodialysis, has been receiving 2 hours of dialysis daily Monday through Friday and has not missed any treatments.     Physical Exam   Triage Vital Signs: ED Triage Vitals  Enc Vitals Group     BP --      Pulse --      Resp --      Temp --      Temp src --      SpO2 09/28/21 1550 96 %     Weight 09/28/21 1554 191 lb 12.8 oz (87 kg)     Height 09/28/21 1554 5\' 7"  (1.702 m)     Head Circumference --      Peak Flow --      Pain Score 09/28/21 1553 10     Pain Loc --      Pain Edu? --      Excl. in [place]? --     Most recent vital signs: Vitals:   09/28/21 1730 09/28/21 1800  BP: (!) 203/96 (!) 180/86  Pulse: 99 96  Resp: 18 18  Temp:    SpO2: 93% 95%    Constitutional: Alert and oriented. Eyes: Conjunctivae are normal. Head: Atraumatic. Nose: No  congestion/rhinnorhea. Mouth/Throat: Mucous membranes are moist.  Neck: No midline cervical spine tenderness to palpation. Cardiovascular: Normal rate, regular rhythm. Grossly normal heart sounds.  2+ radial and DP pulses bilaterally.  Right chest wall TDC in place, site is clean, dry, and intact. Respiratory: Normal respiratory effort.  No retractions. Lungs CTAB.  Right chest wall tenderness to palpation noted. Gastrointestinal: Soft and nontender. No distention. Musculoskeletal: Diffuse tenderness to palpation over left medial thigh and left hip with no obvious deformity.  No tenderness to palpation noted to bilateral knees, ankles, or right hip.  No upper extremity bony tenderness to palpation noted. Neurologic:  Normal speech and language. No gross focal neurologic deficits are appreciated.    ED Results / Procedures / Treatments   Labs (all labs ordered are listed, but only abnormal results are displayed) Labs Reviewed  CBC WITH DIFFERENTIAL/PLATELET - Abnormal; Notable for the following components:      Result Value   RBC 3.09 (*)    Hemoglobin 10.0 (*)  HCT 31.3 (*)    MCV 101.3 (*)    Lymphs Abs 0.6 (*)    All other components within normal limits  BASIC METABOLIC PANEL - Abnormal; Notable for the following components:   Chloride 94 (*)    Glucose, Bld 121 (*)    BUN 59 (*)    Creatinine, Ser 7.75 (*)    GFR, Estimated 6 (*)    Anion gap 16 (*)    All other components within normal limits  RESP PANEL BY RT-PCR (FLU A&B, COVID) ARPGX2  SAMPLE TO BLOOD BANK     EKG  ED ECG REPORT I, Blake Divine, the attending physician, personally viewed and interpreted this ECG.   Date: 09/28/2021  EKG Time: 16:02  Rate: 98  Rhythm: normal sinus rhythm  Axis: Normal  Intervals:none  ST&T Change: None  RADIOLOGY Left hip x-ray reviewed by me with displaced intertrochanteric fracture noted on left.  PROCEDURES:  Critical Care performed:  No  Procedures   MEDICATIONS ORDERED IN ED: Medications  morphine 4 MG/ML injection 4 mg (4 mg Intravenous Given 09/28/21 1603)  HYDROmorphone (DILAUDID) injection 0.5 mg (0.5 mg Intravenous Given 09/28/21 1756)     IMPRESSION / MDM / ASSESSMENT AND PLAN / ED COURSE  I reviewed the triage vital signs and the nursing notes.                              56 y.o. female with past medical history of hypertension, hyperlipidemia, diabetes, and ESRD on HD who presents to the ED complaining of pulling pain in her left thigh for the past couple of days that got acutely worse when she fell onto her left hip just prior to arrival, additionally complains of right chest wall pain following fall.  Differential diagnosis includes, but is not limited to, hip fracture, dislocation, muscle strain, rib fracture, chest contusion.  Patient nontoxic appearing and in no acute distress, reports severe pain in her left hip and thigh area but is neurovascular intact to her bilateral upper and lower extremities.  There is no obvious deformity and we will further assess with x-ray of left hip.  Also check x-ray of chest to assess for traumatic injury, no evidence of injury to her head or neck area.  We will treat symptomatically with IV morphine and reassess.  X-ray of left hip is significant for displaced intertrochanteric fracture, chest x-ray with no acute traumatic injury.  Patient with significant ongoing pain that we will treat with IV Dilaudid, case discussed with Dr. Harlow Mares of orthopedics who will plan for operative intervention.  He also request that we obtain CT of left hip given unusual appearance of fracture.  Case discussed with hospitalist for admission.       FINAL CLINICAL IMPRESSION(S) / ED DIAGNOSES   Final diagnoses:  Closed fracture of left hip, initial encounter ([place])  ESRD on dialysis [facility] ([place]     Rx / DC Orders   ED Discharge Orders     None        Note:  This document was  prepared using Dragon voice recognition software and may include unintentional dictation errors.   Blake Divine, MD 09/28/21 [phone]

## 2021-09-28 NOTE — Assessment & Plan Note (Addendum)
-   Currently via right IJ - Patient's current regimen is Monday, Tuesday, Wednesday, Thursday, Friday for 2 hours each day as patient is currently being trained to get home hemodialysis - Check phosphorus level in the a.m. - Resumed phosphorus scavenger's, Auryxia per home dosing - Nephrology service, has been consulted and staff message has been sent to Dr. [name]

## 2021-09-28 NOTE — ED Triage Notes (Signed)
Patient to ED via [facility] from home for left hip pain. Patient states her leg gave out and heard and "pop" on the left hip. Pt also complaining of right side pain from fall. Patient alert and oriented at this time.

## 2021-09-28 NOTE — Hospital Course (Signed)
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  Vitals in the emergency department showed temperature of 97.8, respiration rate of 18, heart rate of 99, blood pressure 186/92, SPO2 of 99% on room air.  Labs in the emergency department showed serum sodium 137, potassium 4.5, chloride 94, bicarb 27, BUN of 59, serum creatinine of seven 7.75, nonfasting blood glucose 121, GFR of 6.  WBC was 4.6, hemoglobin 10, platelets of 168.  COVID/influenza A/influenza B PCR were negative.  ED provider ordered a left unilateral with or without pelvis x-ray and portable chest x-ray.  Unilateral hip x-ray showed left femur fracture.  ED provider consulted Dr. Harlow Mares of orthopedics who requested a CT of the left hip given unusual appearance of the fracture.  In the emergency department patient received morphine 4 mg IV once and Dilaudid 0.5 mg IV once.

## 2021-09-28 NOTE — Assessment & Plan Note (Signed)
-   Per endocrinology outpatient note on 2022, patient takes glargine 15 units subcutaneous nightly** -Insulin SSI with at bedtime coverage ordered, end-stage renal disease dosing

## 2021-09-28 NOTE — Assessment & Plan Note (Signed)
-   Resumed home amlodipine 5 mg twice daily, losartan 50 mg daily - Labetalol injection 5 mg every 2 hours as needed for SBP greater than 170, 4 doses ordered

## 2021-09-28 NOTE — ED Notes (Signed)
Elizabeth Archer, MD aware of patient's BP of 203/96

## 2021-09-28 NOTE — H&P (Signed)
History and Physical   [name] HWT:[id number] DOB: Aug 03, 1966 DOA: 09/28/2021  PCP: Merryl Hacker, No  Outpatient Specialists: Dr. Mee [facility]ives, [facility] Patient coming from: Home  I have personally briefly reviewed patient's old medical records in [place].  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  Vitals in the emergency department showed temperature of 97.8, respiration rate of 18, heart rate of 99, blood pressure 186/92, SPO2 of 99% on room air.  Labs in the emergency department showed serum sodium 137, potassium 4.5, chloride 94, bicarb 27, BUN of 59, serum creatinine of seven 7.75, nonfasting blood glucose 121, GFR of 6.  WBC was 4.6, hemoglobin 10, platelets of 168.  COVID/influenza A/influenza B PCR were negative.  ED provider ordered a left unilateral with or without pelvis x-ray and portable chest x-ray.  Unilateral hip x-ray showed left femur fracture.  ED provider consulted Dr. Harlow Mares of orthopedics who requested a CT of the left hip given unusual appearance of the fracture.  In the emergency department patient received morphine 4 mg IV once and Dilaudid 0.5 mg IV once.  At bedside patient is able to tell me her name, her age, she knows her husband is at bedside and she knows that she is in the hospital.  She does not appear to be in acute distress.  Per patient, at baseline she ambulates with rollator walker due to increasing weakness over the last few weeks.  She was walking to the bathroom with her rollator walking she stepped on her left foot with with right foot, she then heard a pop and she fell. She  called out to her husband and her husband called for EMS.   She denies chest pain, shortness of breath, fever, abdominal pain, cough, nausea, vomiting.  She currently urinates and is oliguric, however in the last week, she increased urination. She denies dysuria, hematuria. She takes kidney Restore capsules, otc.   She endorses loose stool and intermittent regular stool over the last 2 weeks. She denies watery bowel movements.  Social history: She lives with her husband. She denies tobacco, etoh, recreational drug use. She formerly worked as a Engineer, site for hospice.   Vaccination history: She is not vaccinated for covid 19, influenza.   ROS: Constitutional: no weight change, no fever ENT/Mouth: no sore throat, no rhinorrhea Eyes: no eye pain, no vision changes Cardiovascular: no chest pain, no dyspnea,  no edema, no palpitations Respiratory: no cough, no sputum, no wheezing Gastrointestinal: no nausea, no vomiting, no diarrhea, no constipation Genitourinary: no urinary incontinence, no dysuria, no hematuria Musculoskeletal: no arthralgias, no myalgias Skin: no skin lesions, no pruritus, Neuro: + weakness, no loss of consciousness, no syncope Psych: no anxiety, no depression, no decrease appetite Heme/Lymph: no bruising, no bleeding  ED Course: Discussed with emergency medicine provider, patient requiring hospitalization for concerns of left femur fracture.  Assessment/Plan  Principal Problem:   Femur fracture, left (HCC) Active Problems:   Dyslipidemia   Secondary hyperparathyroidism of renal origin ([place])   Diabetic neuropathy ([place])   End stage renal disease on dialysis ([place])   Type 1 diabetes mellitus with chronic kidney disease on chronic dialysis [facility])   Essential hypertension  * Femur fracture, left (HCC) Assessment &  Plan - Check vitamin D, B12, TSH levels - Orthopedic service has been consulted, appreciate further recommendation - Patient currently has  n.p.o. orders after midnight except for sips with meds - Supportive management with pain control: Norco 5-325 p.o., every 6 hours as needed for moderate pain and morphine 0.5 mg IV every 2 hours as needed for for severe pain   Essential hypertension Assessment & Plan - Resumed home amlodipine 5 mg twice daily, losartan 50 mg daily - Labetalol injection 5 mg every 2 hours as needed for SBP greater than 170, 4 doses ordered  Type 1 diabetes mellitus with chronic kidney disease on chronic dialysis [facility]) Assessment & Plan - Per endocrinology outpatient note on 2022, patient takes glargine 15 units subcutaneous nightly, however she states that her insurance does not pay for this insulin therefore she is only on sliding scale at this time - Insulin SSI with at bedtime coverage ordered, end-stage renal disease dosing  End stage renal disease on dialysis [facility]) Assessment & Plan - Currently via right IJ - Patient's current regimen is Monday, Tuesday, Wednesday, Thursday, Friday for 2 hours each day as patient is currently being trained to get home hemodialysis - Check phosphorus level in the a.m. - Resumed phosphorus scavenger's, Auryxia per home dosing - Nephrology service, has been consulted and staff message has been sent to Dr. [name]  DVT prophylaxis - I have ordered heparin 5000 units subcutaneous, 1 dose in anticipation of surgery on 09/29/2021 - A.m. team to resume pharmacologic DVT prophylaxis when benefits outweigh the risk of bleeding   Chart reviewed.   DVT prophylaxis: Heparin 5000 units subcutaneous, 1 dose ordered  Code Status: Full code Diet: Renal/carb modified diet with fluid restriction now; n.p.o. except for sips with meds effective midnight Family Communication: Updated husband at bedside Disposition Plan: Pending orthopedic evaluation and procedure; anticipate greater than 2 night stay Consults called: Orthopedic, nephrology Admission status: Inpatient, telemetry  cardiac  Past Medical History:  Diagnosis Date   Anemia    vitamin d3 deficiency   Anxiety    Chronic kidney disease    End Stage Renal Disease   Diabetes mellitus without complication (HCC)    GERD (gastroesophageal reflux disease)    nothing over last few years   High serum parathyroid hormone (PTH)    checked through Dialysis   History of kidney stones 2000   Hypertension    Neuromuscular disorder (HCC)    neuropathy in feet   PONV (postoperative nausea and vomiting)    severe nausea requiring many doses of post op antiemetics   Stroke ([place]) 10/2017   thinks she had a series of mini strokes.right leg up to right side of face were numb. no loss of consciousness   Past Surgical History:  Procedure Laterality Date   ABDOMINAL HYSTERECTOMY  2007   AV FISTULA INSERTION W/ RF MAGNETIC GUIDANCE N/A 12/08/2017   Procedure: AV FISTULA INSERTION W/RF MAGNETIC GUIDANCE;  Surgeon: Katha Cabal, MD;  Location: [place] [facility];  Service: Cardiovascular;  Laterality: N/A;   ROTATOR CUFF REPAIR Right 2004   SHOULDER CLOSED REDUCTION Right 2004   UPPER EXTREMITY VENOGRAPHY Left 02/15/2018   Procedure: UPPER EXTREMITY VENOGRAPHY;  Surgeon: Katha Cabal, MD;  Location: [place] [facility];  Service: Cardiovascular;  Laterality: Left;   Social History:  reports that she has never smoked. She has never used smokeless tobacco. She reports that she does not drink alcohol and does not use drugs.  Allergies  Allergen Reactions   Enalapril Hives and Other (See Comments)    Angioedema face.   Ivp Dye [Iodinated Contrast Media] Hives   Lisinopril Shortness Of Breath   Shellfish Allergy Swelling and Other (See Comments)    patient tolerates shellfish by mouth without problem   Family History  Problem Relation Age of Onset   Emphysema Mother    COPD Mother    Cancer Father    Cancer Paternal Aunt    Diabetes Sister    Hypertension Sister    Eczema Sister    Diabetes Brother     Hypertension Brother    Cardiomyopathy Brother    Alcohol abuse Brother    Family history: Family history reviewed and not pertinent.  Prior to Admission medications   Medication Sig Start Date End Date Taking? Authorizing Provider  AURYXIA 1 GM 210 MG(Fe) tablet TAKE 2 TABLETS BY MOUTH WITH MEAL AND 1 TABLET WITH SNACK 10/14/18  Yes [provider]  blood glucose meter kit and supplies Dispense based on patient and insurance preference. Use up to four times daily as directed. (FOR ICD-10 E10.9, E11.9). 06/09/18  Yes Sowles, Drue Stager, MD  glucagon (GLUCAGON EMERGENCY) 1 MG injection Inject 1 mg into the vein once as needed for up to 1 dose. 01/03/19  Yes Sowles, Drue Stager, MD  glucose blood test strip One Touch Ultra, check fsbs 5 times daily 09/27/18  Yes Sowles, Drue Stager, MD  Lancets [facility] ULTRASOFT) lancets Use as instructed 06/09/18  Yes Sowles, Drue Stager, MD  losartan (COZAAR) 50 MG tablet Take 50 mg by mouth daily.  08/26/18  Yes Lateef, Munsoor, MD  Amino Acids (AMINO ACID PO) Take 2 tablets by mouth 2 (two) times daily. Vitality Pack Supplement Amino Acid    [provider]  Cananga Oil Fragrance (YLANG-YLANG OIL FRAGRANCE) OIL Take 6 drops by mouth at bedtime.     [provider]  clove oil liquid Take by mouth.    [provider]  Lavender Oil OIL Take by mouth.    [provider]  Lemon Oil OIL 8 drops by Does not apply route.    [provider]  Methyl Salicylate (WINTERGREEN OIL) OIL Take 5 application by mouth. Takes 5 gtts daily as a blood thinner that does not affect kidneys     [provider]  NON FORMULARY as needed (oils used via inhaling and diffuser).     [provider]  NOVOFINE 32G X 6 MM MISC 1 EACH BY DOES NOT APPLY ROUTE 3 (THREE) TIMES DAILY WITH MEALS. 04/17/19   Sowles, Drue Stager, MD  NOVOLOG FLEXPEN 100 UNIT/ML FlexPen INJECT 10-22 UNITS INTO THE SKIN 3 (THREE) TIMES DAILY WITH MEALS. 04/17/19    Steele Sizer, MD  Orange Oil OIL Take by mouth.    [provider]  OVER THE COUNTER MEDICATION Take 2 tablets 2 (two) times daily by mouth. Cellular Repair Vitality Pack Supplement    [provider]  OVER THE COUNTER MEDICATION Take 4 capsules by mouth 2 (two) times daily. Transfer Factor (Kidney, Glucose, Regular generic)    [provider]  peppermint oil liquid Apply 1 application topically as needed (uses on skin).     [provider]   Physical Exam: Vitals:   09/28/21 1554 09/28/21 1559 09/28/21 1730 09/28/21 1800  BP:  (!) 186/92 (!) 203/96 (!) 180/86  Pulse:  99 99 96  Resp:  $Remo'18 18 18  'XRyKz$ Temp:  97.8 F (36.6 C)    TempSrc:  Oral    SpO2:  99% 93% 95%  Weight: 87 kg     Height: $Remove'5\' 7"'hFlbHnJ$  (1.702 m)      Constitutional: appears older than chronological age, frail, NAD, calm, laying comfortably on patient's right lateral recumbent Eyes: PERRL, lids and conjunctivae normal ENMT: Mucous membranes are moist. Posterior pharynx clear of any exudate or lesions. Age-appropriate dentition. Hearing appropriate Neck: normal, supple, no masses, no thyromegaly Respiratory: clear to auscultation bilaterally, no wheezing, no crackles. Normal respiratory effort. No accessory muscle use.  Cardiovascular: Regular rate and rhythm, no murmurs / rubs / gallops.  Bilateral lower extremity 1+ pitting edema. 2+ pedal pulses. No carotid bruits.  Right IJ. Abdomen: Obese abdomen, no tenderness, no masses palpated, no hepatosplenomegaly. Bowel sounds positive.  Musculoskeletal: no clubbing / cyanosis. No joint deformity upper and lower extremities. Good ROM, no contractures, no atrophy. Normal muscle tone.  Skin: no rashes, lesions, ulcers. No induration Neurologic: Sensation intact. Strength 5/5 in all 4.  Psychiatric: Normal judgment and insight. Alert and oriented x 3. Normal mood.   EKG: independently reviewed, showing sinus rhythm with rate of 98, QTc 459  Chest  x-ray on Admission: I personally reviewed and I agree with radiologist reading as below.  CT Hip Left Wo Contrast  Result Date: 09/28/2021 CLINICAL DATA:  Fall, left hip fracture EXAM: CT OF THE LEFT HIP WITHOUT CONTRAST TECHNIQUE: Multidetector CT imaging of the left hip was performed according to the standard protocol. Multiplanar CT image reconstructions were also generated. RADIATION DOSE REDUCTION: This exam was performed according to the departmental dose-optimization program which includes automated exposure control, adjustment of the mA and/or kV according to patient size and/or use of iterative reconstruction technique. COMPARISON:  X-ray 09/28/2021, CT 06/09/2019 FINDINGS: Bones/Joint/Cartilage Marked diffuse osteopenia. Acute transcervical fracture through the left femoral neck with significant varus angulation. No definite intertrochanteric involvement. Hip joint alignment is maintained without dislocation. Acute nondisplaced fracture of the right inferior pubic ramus (series 2, image 91). No additional fractures are identified. Moderate arthropathy of the pubic symphysis with new erosive or resorptive changes of the bilateral pubic bones. Symphysis is mildly widened compared to previous CT. Ligaments Suboptimally assessed by CT. Muscles and Tendons No acute musculotendinous abnormality by CT. Soft tissues Mild soft tissue swelling at the femoral neck fracture site. No organized hematoma. No acute findings within the visualized portion of the left hemipelvis. IMPRESSION: 1. Acute transcervical fracture through the LEFT femoral neck with varus angulation. 2. Acute nondisplaced fracture of the RIGHT inferior pubic ramus. 3. Moderate arthropathy of the pubic symphysis with new resorptive or erosive changes of the bilateral pubic bones. This may be secondary to underlying renal osteodystrophy. Septic arthritis could have this appearance in the appropriate clinical setting. Mild widening at the pubic  symphysis is favored secondary to underlying arthropathy/resorption rather than posttraumatic diastasis. 4. Marked bony demineralization. Electronically Signed   By: Davina Poke D.O.   On: 09/28/2021 18:56   DG Chest Port 1 View  Result Date: 09/28/2021 CLINICAL DATA:  Fall EXAM: PORTABLE CHEST 1 VIEW COMPARISON:  None. FINDINGS: The cardiomediastinal silhouette is enlarged in contour.RIGHT chest CVC tip terminating over the superior cavoatrial junction. Enlarged appearance of bilateral hilar contours most consistent with enlarged pulmonary arteries and likely underlying pulmonary arterial hypertension. Elevation of the RIGHT hemidiaphragm. No pleural effusion. No pneumothorax. No acute pleuroparenchymal abnormality. Visualized abdomen is unremarkable. Osteopenia. IMPRESSION: 1. Cardiomegaly with enlarged hilar contours likely reflecting enlarged pulmonary arteries and underlying pulmonary arterial hypertension. Electronically  Signed   By: Valentino Saxon M.D.   On: 09/28/2021 17:05   DG Hip Unilat W or Wo Pelvis 2-3 Views Left  Result Date: 09/28/2021 CLINICAL DATA:  Fall EXAM: DG HIP (WITH OR WITHOUT PELVIS) 2-3V LEFT COMPARISON:  None. FINDINGS: Osteopenia. There is a displaced fracture through the neck and intratrochanteric LEFT femur with superior translocation of the distal femur. Femoral head appears to be seated within the acetabulum although evaluation is limited by body habitus. No additional fracture noted. Limited assessment of the sacrum secondary to overlapping bowel contents and profound osteopenia. IMPRESSION: Displaced foreshortened fracture of the neck and inter trochanteric LEFT femur. Electronically Signed   By: Valentino Saxon M.D.   On: 09/28/2021 17:04    Labs on Admission: I have personally reviewed following labs  CBC: Recent Labs  Lab 09/28/21 1556  WBC 4.6  NEUTROABS 3.4  HGB 10.0*  HCT 31.3*  MCV 101.3*  PLT 446   Basic Metabolic Panel: Recent Labs  Lab  09/28/21 1556  NA 137  K 4.5  CL 94*  CO2 27  GLUCOSE 121*  BUN 59*  CREATININE 7.75*  CALCIUM 10.0   GFR: Estimated Creatinine Clearance: 9.1 mL/min (A) (by C-G formula based on SCr of 7.75 mg/dL (H)).  Dr. Tobie Poet [facility]  If 7PM-7AM, please contact overnight-coverage provider If 7AM-7PM, please contact day coverage provider [email or web address]  09/28/2021, 7:52 PM

## 2021-09-29 ENCOUNTER — Inpatient Hospital Stay: Payer: Medicare Other | Admitting: Anesthesiology

## 2021-09-29 ENCOUNTER — Other Ambulatory Visit: Payer: Self-pay

## 2021-09-29 ENCOUNTER — Inpatient Hospital Stay: Payer: Medicare Other

## 2021-09-29 ENCOUNTER — Encounter: Payer: Self-pay | Admitting: Internal Medicine

## 2021-09-29 ENCOUNTER — Encounter
Admission: EM | Disposition: A | Discharge status: Skilled Nursing Facility | Payer: Self-pay | Source: Home / Self Care | Attending: Family Medicine

## 2021-09-29 DIAGNOSIS — S72102A Unspecified trochanteric fracture of left femur, initial encounter for closed fracture: Secondary | ICD-10-CM | POA: Diagnosis not present

## 2021-09-29 HISTORY — PX: HIP ARTHROPLASTY: SHX981

## 2021-09-29 LAB — VITAMIN B12: Vitamin B-12: 640 pg/mL (ref 180–914)

## 2021-09-29 LAB — VITAMIN D 25 HYDROXY (VIT D DEFICIENCY, FRACTURES): Vit D, 25-Hydroxy: 23.67 ng/mL — ABNORMAL LOW (ref 30–100)

## 2021-09-29 LAB — BASIC METABOLIC PANEL
Anion gap: 17 — ABNORMAL HIGH (ref 5–15)
BUN: 69 mg/dL — ABNORMAL HIGH (ref 6–20)
CO2: 25 mmol/L (ref 22–32)
Calcium: 9.4 mg/dL (ref 8.9–10.3)
Chloride: 93 mmol/L — ABNORMAL LOW (ref 98–111)
Creatinine, Ser: 8.78 mg/dL — ABNORMAL HIGH (ref 0.44–1.00)
GFR, Estimated: 5 mL/min — ABNORMAL LOW (ref >60)
Glucose, Bld: 130 mg/dL — ABNORMAL HIGH (ref 70–99)
Potassium: 5.3 mmol/L — ABNORMAL HIGH (ref 3.5–5.1)
Sodium: 135 mmol/L (ref 135–145)

## 2021-09-29 LAB — CBG MONITORING, ED
Glucose-Capillary: 128 mg/dL — ABNORMAL HIGH (ref 70–99)
Glucose-Capillary: 153 mg/dL — ABNORMAL HIGH (ref 70–99)
Glucose-Capillary: 170 mg/dL — ABNORMAL HIGH (ref 70–99)

## 2021-09-29 LAB — CBC
HCT: 30.2 % — ABNORMAL LOW (ref 36.0–46.0)
Hemoglobin: 9.9 g/dL — ABNORMAL LOW (ref 12.0–15.0)
MCH: 31.9 pg (ref 26.0–34.0)
MCHC: 32.8 g/dL (ref 30.0–36.0)
MCV: 97.4 fL (ref 80.0–100.0)
Platelets: 158 10*3/uL (ref 150–400)
RBC: 3.1 MIL/uL — ABNORMAL LOW (ref 3.87–5.11)
RDW: 13.4 % (ref 11.5–15.5)
WBC: 5.4 10*3/uL (ref 4.0–10.5)
nRBC: 0 % (ref 0.0–0.2)

## 2021-09-29 LAB — HEMOGLOBIN A1C
Hgb A1c MFr Bld: 6.2 % — ABNORMAL HIGH (ref 4.8–5.6)
Mean Plasma Glucose: 131 mg/dL

## 2021-09-29 LAB — TSH: TSH: 2.961 u[IU]/mL (ref 0.350–4.500)

## 2021-09-29 LAB — MAGNESIUM: Magnesium: 2.4 mg/dL (ref 1.7–2.4)

## 2021-09-29 LAB — SURGICAL PCR SCREEN
MRSA, PCR: NEGATIVE
Staphylococcus aureus: NEGATIVE

## 2021-09-29 LAB — PHOSPHORUS: Phosphorus: 6.3 mg/dL — ABNORMAL HIGH (ref 2.5–4.6)

## 2021-09-29 LAB — HIV ANTIBODY (ROUTINE TESTING W REFLEX): HIV Screen 4th Generation wRfx: NONREACTIVE

## 2021-09-29 IMAGING — RF DG HIP (WITH OR WITHOUT PELVIS) 1V*L*
1 series · 3 of 3 positions shown · non-contrast
Comparison: [DATE]

CLINICAL DATA: Left hip arthroplasty, fracture left femur

EXAM:
DG HIP (WITH OR WITHOUT PELVIS) 1V*L*

[Series 1: dg x-ray · 0.20mm/px · 3 of 3 slices shown]
[im 1/3]
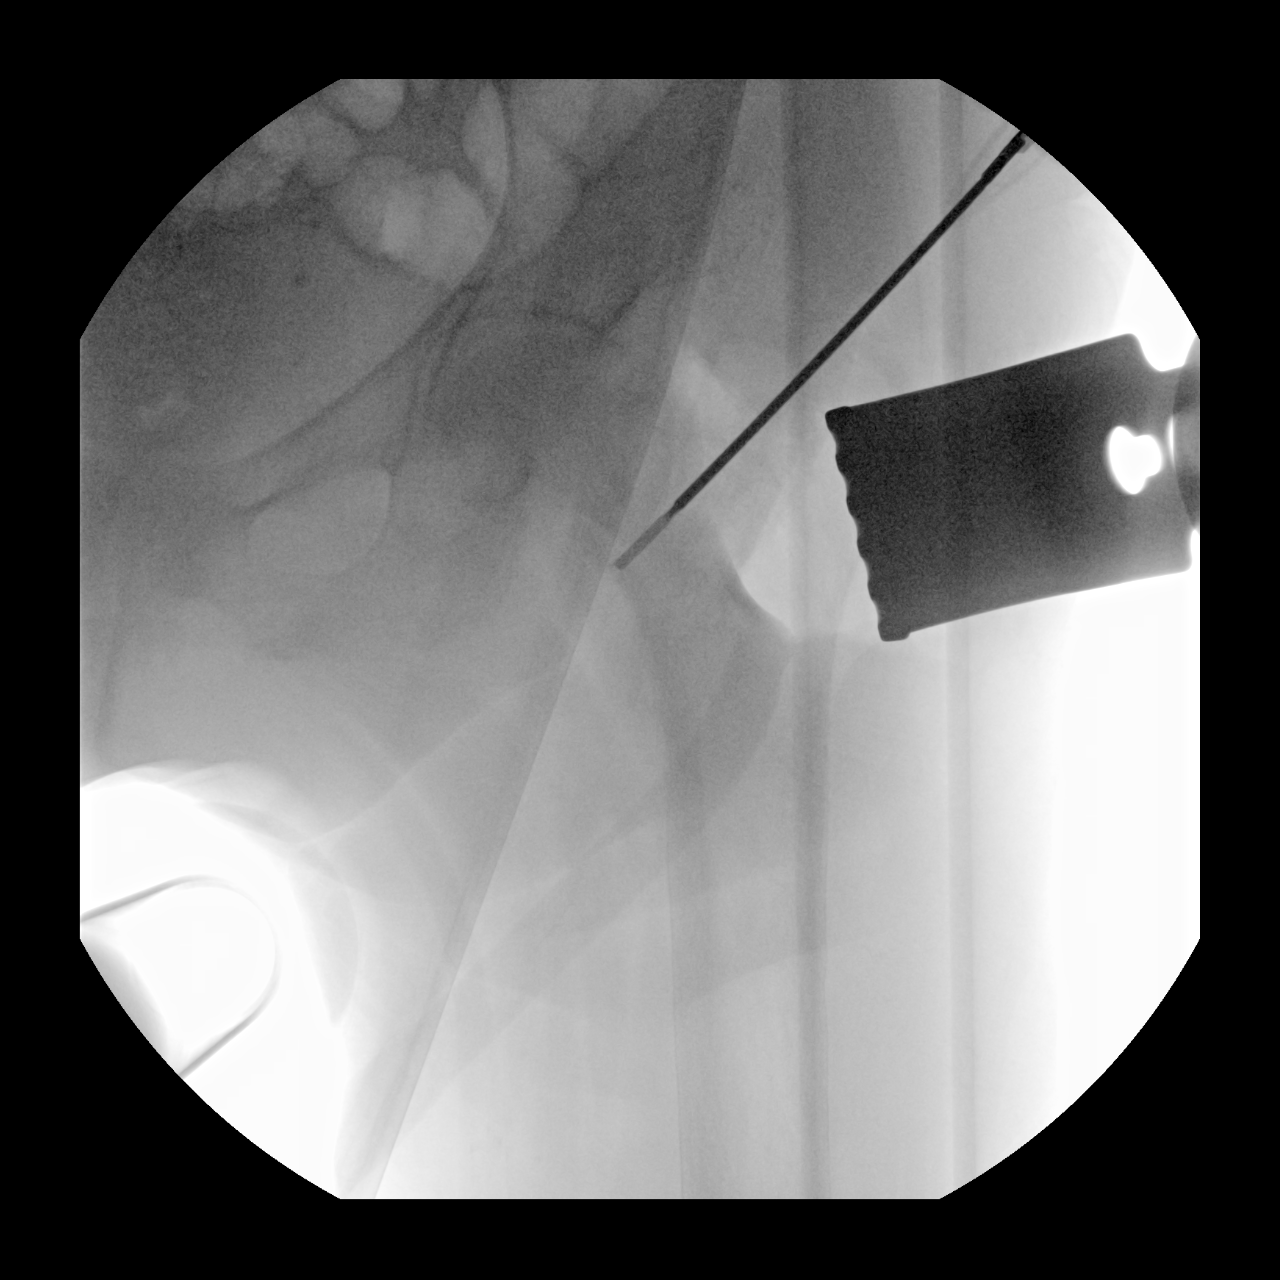
[im 2/3]
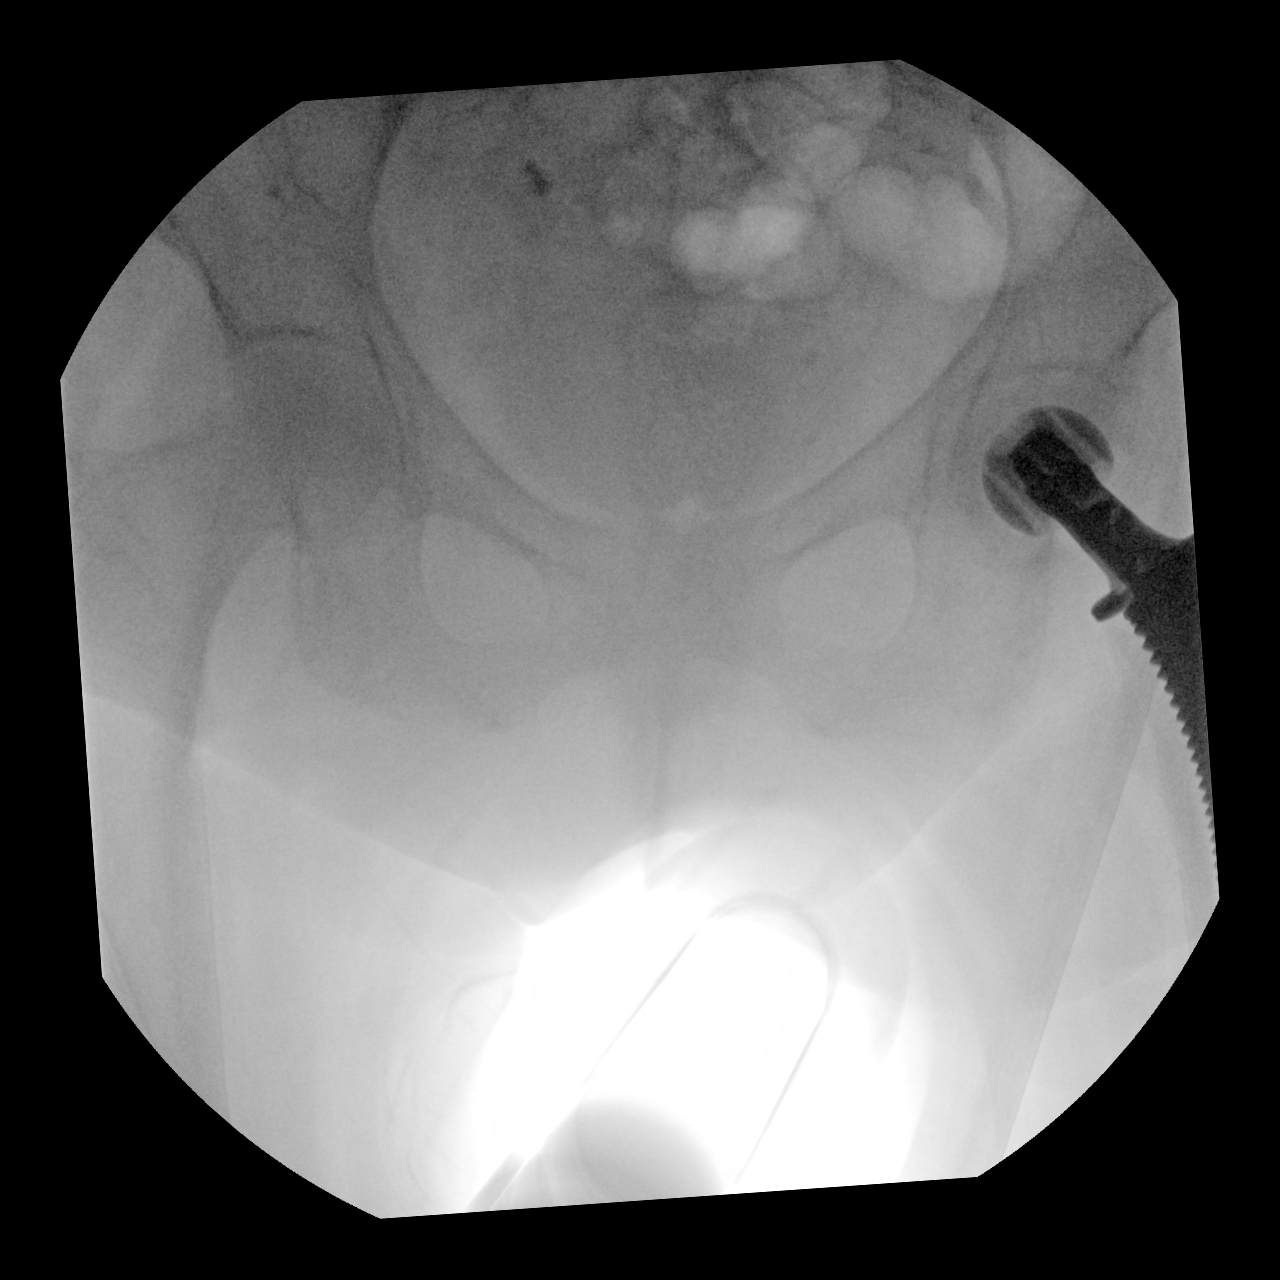
[im 3/3]
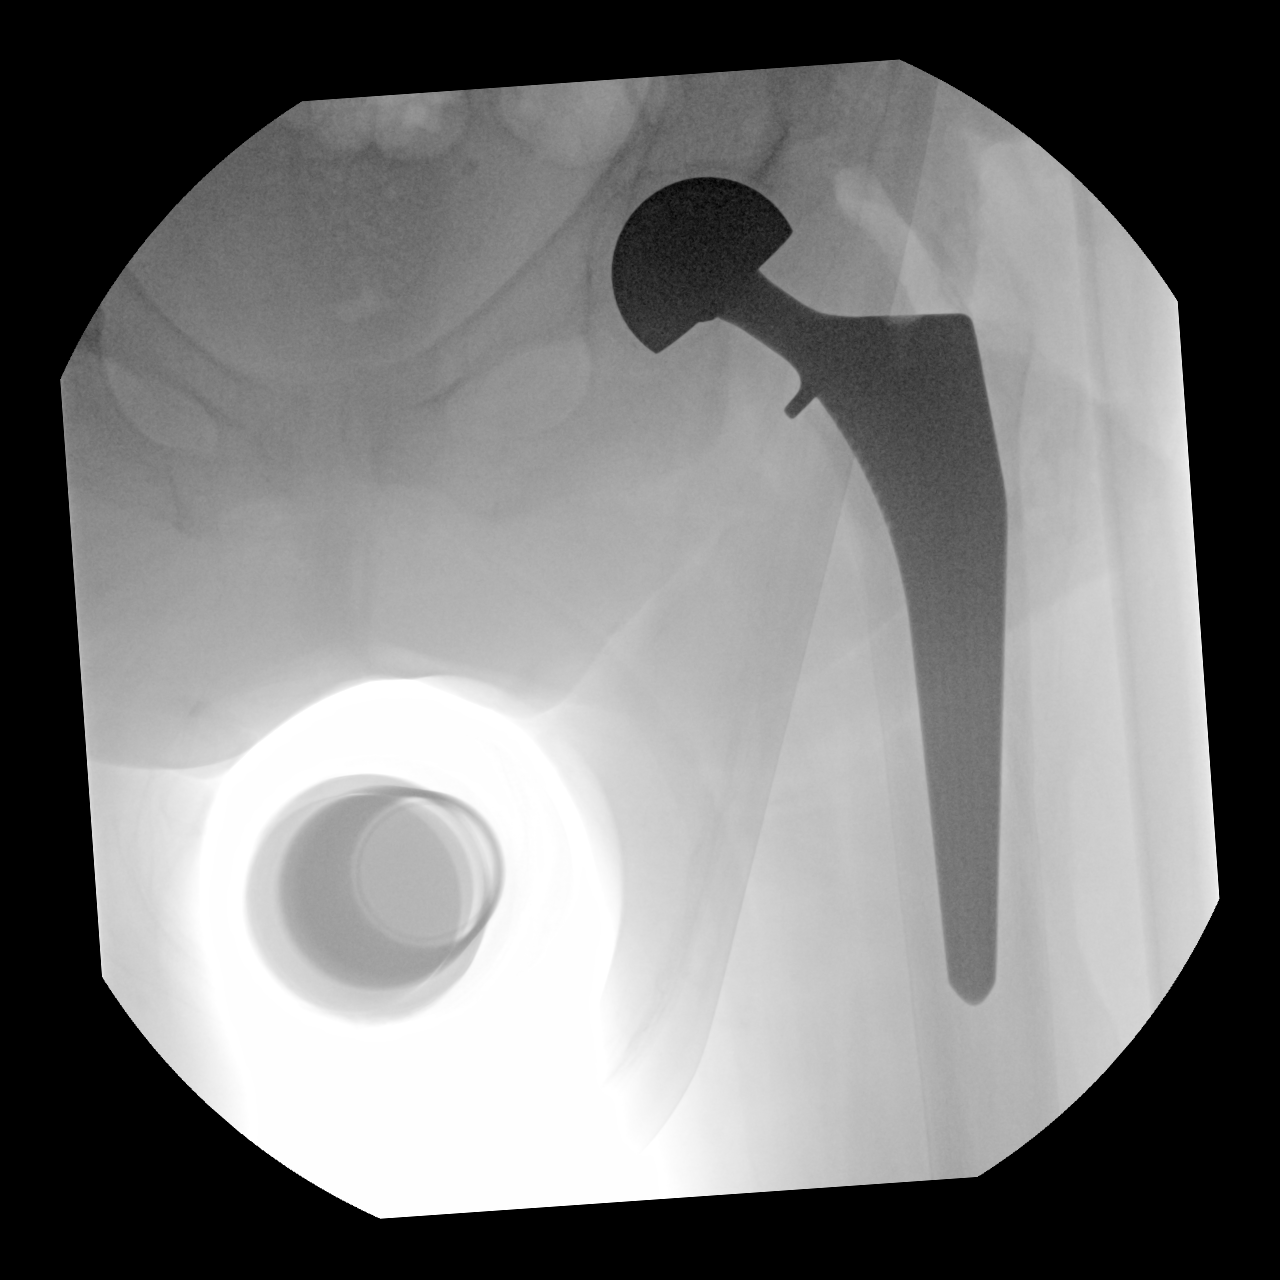

[3 of 3 positions shown; findings below may reference images not displayed]

FINDINGS: Fluoroscopic images show interval left hip arthroplasty.
Fluoroscopic time was 6 seconds. Radiation dose is 0.67 mGy.
IMPRESSION: Fluoroscopic assistance was provided for left hip arthroplasty.

## 2021-09-29 SURGERY — HEMIARTHROPLASTY, HIP, DIRECT ANTERIOR APPROACH, FOR FRACTURE
Anesthesia: General | Site: Hip | Laterality: Left

## 2021-09-29 MED ORDER — TRANEXAMIC ACID 1000 MG/10ML IV SOLN
INTRAVENOUS | Status: DC | PRN
  Administered 2021-09-29: 1000 mg via TOPICAL

## 2021-09-29 MED ORDER — DEXTROSE 50 % IV SOLN: 1.0000 | Freq: Once | INTRAVENOUS | Status: DC

## 2021-09-29 MED ORDER — INSULIN ASPART 100 UNIT/ML IJ SOLN
5.0000 [IU] | Freq: Once | INTRAMUSCULAR | Status: DC
  Filled 2021-09-29: qty 0.05

## 2021-09-29 MED ORDER — 0.9 % SODIUM CHLORIDE (POUR BTL) OPTIME
TOPICAL | Status: DC | PRN
  Administered 2021-09-29: 500 mL

## 2021-09-29 MED ORDER — OXYCODONE HCL 5 MG PO TABS
5.0000 mg | ORAL_TABLET | Freq: Once | ORAL | Status: AC | PRN
  Administered 2021-09-29: 5 mg via ORAL

## 2021-09-29 MED ORDER — OXYCODONE HCL 5 MG PO TABS
ORAL_TABLET | ORAL | Status: AC
  Filled 2021-09-29: qty 1

## 2021-09-29 MED ORDER — LIDOCAINE-PRILOCAINE 2.5-2.5 % EX CREA: 1.0000 "application " | TOPICAL_CREAM | CUTANEOUS | Status: DC | PRN

## 2021-09-29 MED ORDER — PHENOL 1.4 % MT LIQD
1.0000 | OROMUCOSAL | Status: DC | PRN
  Filled 2021-09-29: qty 177

## 2021-09-29 MED ORDER — PROPOFOL 1000 MG/100ML IV EMUL
INTRAVENOUS | Status: AC
  Filled 2021-09-29: qty 100

## 2021-09-29 MED ORDER — PROPOFOL 10 MG/ML IV BOLUS
INTRAVENOUS | Status: DC | PRN
  Administered 2021-09-29: 125 ug/kg/min via INTRAVENOUS
  Administered 2021-09-29: 100 mg via INTRAVENOUS

## 2021-09-29 MED ORDER — HYDROCODONE-ACETAMINOPHEN 5-325 MG PO TABS
1.0000 | ORAL_TABLET | ORAL | Status: DC | PRN
  Administered 2021-09-29 – 2021-10-06 (×12): 1 via ORAL
  Filled 2021-09-29 (×13): qty 1

## 2021-09-29 MED ORDER — ALTEPLASE 2 MG IJ SOLR: 2.0000 mg | Freq: Once | INTRAMUSCULAR | Status: DC | PRN

## 2021-09-29 MED ORDER — HYDRALAZINE HCL 20 MG/ML IJ SOLN: 10.0000 mg | INTRAMUSCULAR | Status: DC | PRN

## 2021-09-29 MED ORDER — FENTANYL CITRATE (PF) 100 MCG/2ML IJ SOLN
25.0000 ug | INTRAMUSCULAR | Status: DC | PRN
  Administered 2021-09-29: 25 ug via INTRAVENOUS

## 2021-09-29 MED ORDER — ONDANSETRON HCL 4 MG/2ML IJ SOLN
INTRAMUSCULAR | Status: AC
  Filled 2021-09-29: qty 2

## 2021-09-29 MED ORDER — METHOCARBAMOL 500 MG PO TABS
500.0000 mg | ORAL_TABLET | Freq: Three times a day (TID) | ORAL | Status: DC
  Administered 2021-09-29 – 2021-09-30 (×2): 500 mg via ORAL
  Filled 2021-09-29 (×6): qty 1

## 2021-09-29 MED ORDER — CEFAZOLIN SODIUM-DEXTROSE 2-4 GM/100ML-% IV SOLN
2.0000 g | INTRAVENOUS | Status: AC
  Administered 2021-09-29: 2 g via INTRAVENOUS

## 2021-09-29 MED ORDER — INSULIN ASPART 100 UNIT/ML IV SOLN
5.0000 [IU] | Freq: Once | INTRAVENOUS | Status: DC
  Filled 2021-09-29: qty 0.05

## 2021-09-29 MED ORDER — OXYCODONE HCL 5 MG/5ML PO SOLN: 5.0000 mg | Freq: Once | ORAL | Status: AC | PRN

## 2021-09-29 MED ORDER — FENTANYL CITRATE (PF) 100 MCG/2ML IJ SOLN
INTRAMUSCULAR | Status: DC | PRN
  Administered 2021-09-29 (×2): 50 ug via INTRAVENOUS

## 2021-09-29 MED ORDER — ONDANSETRON HCL 4 MG/2ML IJ SOLN
4.0000 mg | Freq: Once | INTRAMUSCULAR | Status: AC
  Administered 2021-09-30: 4 mg via INTRAVENOUS
  Filled 2021-09-29: qty 2

## 2021-09-29 MED ORDER — LIDOCAINE HCL (PF) 2 % IJ SOLN
INTRAMUSCULAR | Status: AC
  Filled 2021-09-29: qty 5

## 2021-09-29 MED ORDER — TRANEXAMIC ACID-NACL 1000-0.7 MG/100ML-% IV SOLN
INTRAVENOUS | Status: AC
  Filled 2021-09-29: qty 100

## 2021-09-29 MED ORDER — BISACODYL 10 MG RE SUPP: 10.0000 mg | Freq: Every day | RECTAL | Status: DC | PRN

## 2021-09-29 MED ORDER — ROCURONIUM BROMIDE 10 MG/ML (PF) SYRINGE
PREFILLED_SYRINGE | INTRAVENOUS | Status: AC
  Filled 2021-09-29: qty 10

## 2021-09-29 MED ORDER — MIDAZOLAM HCL 2 MG/2ML IJ SOLN
INTRAMUSCULAR | Status: AC
  Filled 2021-09-29: qty 2

## 2021-09-29 MED ORDER — CHLORHEXIDINE GLUCONATE CLOTH 2 % EX PADS: 6.0000 | MEDICATED_PAD | Freq: Every day | CUTANEOUS | Status: DC

## 2021-09-29 MED ORDER — TRANEXAMIC ACID-NACL 1000-0.7 MG/100ML-% IV SOLN: 1000.0000 mg | INTRAVENOUS | Status: AC

## 2021-09-29 MED ORDER — HYDROMORPHONE HCL 1 MG/ML IJ SOLN
0.5000 mg | INTRAMUSCULAR | Status: DC | PRN
  Administered 2021-09-30 (×2): 0.5 mg via INTRAVENOUS
  Filled 2021-09-29 (×2): qty 1

## 2021-09-29 MED ORDER — HEPARIN SODIUM (PORCINE) 1000 UNIT/ML DIALYSIS: 1000.0000 [IU] | INTRAMUSCULAR | Status: DC | PRN

## 2021-09-29 MED ORDER — SODIUM CHLORIDE 0.9 % IV SOLN: 100.0000 mL | INTRAVENOUS | Status: DC | PRN

## 2021-09-29 MED ORDER — PRONTOSAN WOUND IRRIGATION OPTIME
TOPICAL | Status: DC | PRN
  Administered 2021-09-29: 1 via TOPICAL

## 2021-09-29 MED ORDER — DOCUSATE SODIUM 100 MG PO CAPS
100.0000 mg | ORAL_CAPSULE | Freq: Two times a day (BID) | ORAL | Status: DC
  Administered 2021-09-30 – 2021-10-05 (×12): 100 mg via ORAL
  Filled 2021-09-29 (×13): qty 1

## 2021-09-29 MED ORDER — ONDANSETRON HCL 4 MG PO TABS
4.0000 mg | ORAL_TABLET | Freq: Four times a day (QID) | ORAL | Status: DC | PRN
  Administered 2021-10-03: 4 mg via ORAL

## 2021-09-29 MED ORDER — SODIUM CHLORIDE 0.9 % IR SOLN
Status: DC | PRN
  Administered 2021-09-29: 3000 mL

## 2021-09-29 MED ORDER — FENTANYL CITRATE (PF) 100 MCG/2ML IJ SOLN
INTRAMUSCULAR | Status: AC
  Filled 2021-09-29: qty 2

## 2021-09-29 MED ORDER — SODIUM CHLORIDE 0.9 % IV SOLN
INTRAVENOUS | Status: AC | PRN
  Administered 2021-09-29: 250 mL via INTRAMUSCULAR

## 2021-09-29 MED ORDER — ONDANSETRON HCL 4 MG/2ML IJ SOLN: 4.0000 mg | Freq: Four times a day (QID) | INTRAMUSCULAR | Status: DC | PRN

## 2021-09-29 MED ORDER — LIDOCAINE HCL (CARDIAC) PF 100 MG/5ML IV SOSY
PREFILLED_SYRINGE | INTRAVENOUS | Status: DC | PRN
Start: 2021-09-29 — End: 2021-09-29
  Administered 2021-09-29: 50 mg via INTRAVENOUS

## 2021-09-29 MED ORDER — DEXTROSE 50 % IV SOLN
1.0000 | Freq: Once | INTRAVENOUS | Status: DC
  Filled 2021-09-29: qty 50

## 2021-09-29 MED ORDER — ALUM & MAG HYDROXIDE-SIMETH 200-200-20 MG/5ML PO SUSP
30.0000 mL | ORAL | Status: DC | PRN
Start: 2021-09-29 — End: 2021-10-06

## 2021-09-29 MED ORDER — ONDANSETRON HCL 4 MG/2ML IJ SOLN
INTRAMUSCULAR | Status: DC | PRN
  Administered 2021-09-29: 4 mg via INTRAVENOUS

## 2021-09-29 MED ORDER — STERILE WATER FOR IRRIGATION IR SOLN
Status: DC | PRN
  Administered 2021-09-29: 1000 mL

## 2021-09-29 MED ORDER — MENTHOL 3 MG MT LOZG
1.0000 | LOZENGE | OROMUCOSAL | Status: DC | PRN
Start: 2021-09-29 — End: 2021-10-06
  Filled 2021-09-29: qty 9

## 2021-09-29 MED ORDER — METOCLOPRAMIDE HCL 10 MG PO TABS: 5.0000 mg | ORAL_TABLET | Freq: Three times a day (TID) | ORAL | Status: DC | PRN

## 2021-09-29 MED ORDER — LOSARTAN POTASSIUM 50 MG PO TABS
50.0000 mg | ORAL_TABLET | Freq: Every day | ORAL | Status: DC
  Administered 2021-10-02 – 2021-10-06 (×5): 50 mg via ORAL
  Filled 2021-09-29 (×5): qty 1

## 2021-09-29 MED ORDER — INSULIN ASPART 100 UNIT/ML IV SOLN
10.0000 [IU] | Freq: Once | INTRAVENOUS | Status: DC
Start: 2021-09-29 — End: 2021-10-06
  Filled 2021-09-29: qty 0.1

## 2021-09-29 MED ORDER — MAGNESIUM HYDROXIDE 400 MG/5ML PO SUSP: 30.0000 mL | Freq: Every day | ORAL | Status: DC | PRN

## 2021-09-29 MED ORDER — METOCLOPRAMIDE HCL 5 MG/ML IJ SOLN: 5.0000 mg | Freq: Three times a day (TID) | INTRAMUSCULAR | Status: DC | PRN

## 2021-09-29 MED ORDER — ALTEPLASE 2 MG IJ SOLR
INTRAMUSCULAR | Status: AC
  Administered 2021-09-30: 2 mg
  Filled 2021-09-29: qty 2

## 2021-09-29 MED ORDER — FENTANYL CITRATE (PF) 100 MCG/2ML IJ SOLN
INTRAMUSCULAR | Status: AC
  Administered 2021-09-29: 25 ug via INTRAVENOUS
  Filled 2021-09-29: qty 2

## 2021-09-29 MED ORDER — PENTAFLUOROPROP-TETRAFLUOROETH EX AERO: 1.0000 "application " | INHALATION_SPRAY | CUTANEOUS | Status: DC | PRN

## 2021-09-29 MED ORDER — LACTATED RINGERS IV SOLN: INTRAVENOUS | Status: DC

## 2021-09-29 MED ORDER — TRANEXAMIC ACID 1000 MG/10ML IV SOLN
INTRAVENOUS | Status: AC
  Filled 2021-09-29: qty 10

## 2021-09-29 MED ORDER — ROCURONIUM BROMIDE 100 MG/10ML IV SOLN
INTRAVENOUS | Status: DC | PRN
  Administered 2021-09-29: 50 mg via INTRAVENOUS
  Administered 2021-09-29: 20 mg via INTRAVENOUS

## 2021-09-29 MED ORDER — SODIUM CHLORIDE (PF) 0.9 % IJ SOLN: 5.0000 [IU] | Freq: Once | INTRAMUSCULAR | Status: DC

## 2021-09-29 MED ORDER — BUPIVACAINE-EPINEPHRINE (PF) 0.25% -1:200000 IJ SOLN
INTRAMUSCULAR | Status: DC | PRN
  Administered 2021-09-29: 30 mL

## 2021-09-29 MED ORDER — LIDOCAINE HCL (PF) 1 % IJ SOLN: 5.0000 mL | INTRAMUSCULAR | Status: DC | PRN

## 2021-09-29 MED ORDER — MIDAZOLAM HCL 2 MG/2ML IJ SOLN
INTRAMUSCULAR | Status: DC | PRN
  Administered 2021-09-29 (×2): 1 mg via INTRAVENOUS

## 2021-09-29 MED ORDER — SODIUM CHLORIDE 0.9 % IV SOLN: INTRAVENOUS | Status: DC | PRN

## 2021-09-29 MED ORDER — CEFAZOLIN SODIUM-DEXTROSE 2-4 GM/100ML-% IV SOLN
INTRAVENOUS | Status: AC
  Filled 2021-09-29: qty 100

## 2021-09-29 SURGICAL SUPPLY — 58 items
APL PRP STRL LF DISP 70% ISPRP (MISCELLANEOUS) ×1
BLADE SAGITTAL WIDE XTHICK NO (BLADE) ×2 IMPLANT
BNDG COHESIVE 4X5 TAN ST LF (GAUZE/BANDAGES/DRESSINGS) ×4 IMPLANT
BRUSH SCRUB EZ  4% CHG (MISCELLANEOUS) ×1
BRUSH SCRUB EZ 4% CHG (MISCELLANEOUS) ×1 IMPLANT
CHLORAPREP W/TINT 26 (MISCELLANEOUS) ×2 IMPLANT
DRAPE 3/4 80X56 (DRAPES) ×2 IMPLANT
DRAPE C-ARM 42X72 X-RAY (DRAPES) ×2 IMPLANT
DRAPE STERI IOBAN 125X83 (DRAPES) IMPLANT
DRAPE U-SHAPE 47X51 STRL (DRAPES) ×2 IMPLANT
DRSG AQUACEL AG ADV 3.5X10 (GAUZE/BANDAGES/DRESSINGS) IMPLANT
DRSG AQUACEL AG ADV 3.5X14 (GAUZE/BANDAGES/DRESSINGS) IMPLANT
ELECT REM PT RETURN 9FT ADLT (ELECTROSURGICAL) ×2
ELECTRODE REM PT RTRN 9FT ADLT (ELECTROSURGICAL) ×1 IMPLANT
GAUZE 4X4 16PLY ~~LOC~~+RFID DBL (SPONGE) ×2 IMPLANT
GAUZE XEROFORM 1X8 LF (GAUZE/BANDAGES/DRESSINGS) IMPLANT
GLOVE SURG ORTHO LTX SZ8 (GLOVE) ×4 IMPLANT
GLOVE SURG SYN 8.0 (GLOVE) ×4 IMPLANT
GLOVE SURG SYN 8.0 PF PI (GLOVE) ×2 IMPLANT
GLOVE SURG UNDER LTX SZ8 (GLOVE) ×2 IMPLANT
GLOVE SURG UNDER POLY LF SZ8.5 (GLOVE) ×2 IMPLANT
GOWN STRL REUS W/ TWL LRG LVL3 (GOWN DISPOSABLE) ×1 IMPLANT
GOWN STRL REUS W/ TWL XL LVL3 (GOWN DISPOSABLE) ×2 IMPLANT
GOWN STRL REUS W/TWL LRG LVL3 (GOWN DISPOSABLE) ×2
GOWN STRL REUS W/TWL XL LVL3 (GOWN DISPOSABLE) ×4
HEAD FEMORAL 22MM HIP (Hips) ×1 IMPLANT
HOLSTER ELECTROSUGICAL PENCIL (MISCELLANEOUS) ×2 IMPLANT
IV NS 250ML (IV SOLUTION) ×2
IV NS 250ML BAXH (IV SOLUTION) IMPLANT
IV NS IRRIG 3000ML ARTHROMATIC (IV SOLUTION) ×2 IMPLANT
KIT PATIENT CARE HANA TABLE (KITS) ×2 IMPLANT
KIT TURNOVER CYSTO (KITS) ×2 IMPLANT
LINER BIPOLAR 22/41MM (Hips) ×1 IMPLANT
MANIFOLD NEPTUNE II (INSTRUMENTS) ×2 IMPLANT
MAT ABSORB  FLUID 56X50 GRAY (MISCELLANEOUS) ×1
MAT ABSORB FLUID 56X50 GRAY (MISCELLANEOUS) ×1 IMPLANT
NDL SAFETY ECLIPSE 18X1.5 (NEEDLE) IMPLANT
NDL SPNL 20GX3.5 QUINCKE YW (NEEDLE) ×1 IMPLANT
NEEDLE HYPO 18GX1.5 SHARP (NEEDLE)
NEEDLE HYPO 22GX1.5 SAFETY (NEEDLE) ×2 IMPLANT
NEEDLE SPNL 20GX3.5 QUINCKE YW (NEEDLE) ×2 IMPLANT
PACK HIP PROSTHESIS (MISCELLANEOUS) ×2 IMPLANT
PADDING CAST BLEND 4X4 NS (MISCELLANEOUS) ×4 IMPLANT
PILLOW ABDUCTION MEDIUM (MISCELLANEOUS) ×2 IMPLANT
POLARSTEM STEM LATERAL TI/HA WITH COLLAR 12/14 ×1 IMPLANT
PULSAVAC PLUS IRRIG FAN TIP (DISPOSABLE) ×2
SOLUTION PRONTOSAN WOUND 350ML (IRRIGATION / IRRIGATOR) ×2 IMPLANT
SPONGE T-LAP 18X18 ~~LOC~~+RFID (SPONGE) ×8 IMPLANT
STAPLER SKIN PROX 35W (STAPLE) ×2 IMPLANT
STEM LATERAL W/COLLAR SZ 7 (Stem) ×1 IMPLANT
SUT BONE WAX W31G (SUTURE) ×1 IMPLANT
SUT DVC 2 QUILL PDO  T11 36X36 (SUTURE) ×1
SUT DVC 2 QUILL PDO T11 36X36 (SUTURE) ×1 IMPLANT
SUT VIC AB 2-0 CT1 18 (SUTURE) ×2 IMPLANT
SYR 20ML LL LF (SYRINGE) ×2 IMPLANT
TIP FAN IRRIG PULSAVAC PLUS (DISPOSABLE) ×1 IMPLANT
WAND WEREWOLF FASTSEAL 6.0 (MISCELLANEOUS) ×2 IMPLANT
WATER STERILE IRR 500ML POUR (IV SOLUTION) ×1 IMPLANT

## 2021-09-29 NOTE — Consult Note (Addendum)
ORTHOPAEDIC CONSULTATION  REQUESTING PHYSICIAN: Sidney Ace, MD  Chief Complaint: left hip pain  HPI: [name] is a 56 y.o. female who complains of severe left hip pain. The pain is sharp in character. The pain is severe and 10/10. The pain is worse with movement and better with rest. Denies any numbness, tingling or constitutional symptoms.  Past Medical History:  Diagnosis Date   Anemia    vitamin d3 deficiency   Anxiety    Chronic kidney disease    End Stage Renal Disease   Diabetes mellitus without complication (HCC)    GERD (gastroesophageal reflux disease)    nothing over last few years   High serum parathyroid hormone (PTH)    checked through Dialysis   History of kidney stones 2000   Hypertension    Neuromuscular disorder (HCC)    neuropathy in feet   PONV (postoperative nausea and vomiting)    severe nausea requiring many doses of post op antiemetics   Stroke ([name]) 10/2017   thinks she had a series of mini strokes.right leg up to right side of face were numb. no loss of consciousness   Past Surgical History:  Procedure Laterality Date   ABDOMINAL HYSTERECTOMY  2007   AV FISTULA INSERTION W/ RF MAGNETIC GUIDANCE N/A 12/08/2017   Procedure: AV FISTULA INSERTION W/RF MAGNETIC GUIDANCE;  Surgeon: Katha Cabal, MD;  Location: [facility];  Service: Cardiovascular;  Laterality: N/A;   ROTATOR CUFF REPAIR Right 2004   SHOULDER CLOSED REDUCTION Right 2004   UPPER EXTREMITY VENOGRAPHY Left 02/15/2018   Procedure: UPPER EXTREMITY VENOGRAPHY;  Surgeon: Katha Cabal, MD;  Location: [facility];  Service: Cardiovascular;  Laterality: Left;   Social History   Socioeconomic History   Marital status: Married    Spouse name: Aaron Edelman   Number of children: 0   Years of education: Not on file   Highest education level: Some college, no degree  Occupational History   Occupation: Clinical cytogeneticist  Tobacco Use   Smoking status:  Never   Smokeless tobacco: Never  Vaping Use   Vaping Use: Never used  Substance and Sexual Activity   Alcohol use: No   Drug use: Never   Sexual activity: Yes    Partners: Male  Other Topics Concern   Not on file  Social History Narrative   Not on file   Social Determinants of Health   Financial Resource Strain: Not on file  Food Insecurity: Not on file  Transportation Needs: Not on file  Physical Activity: Not on file  Stress: Not on file  Social Connections: Not on file   Family History  Problem Relation Age of Onset   Emphysema Mother    COPD Mother    Cancer Father    Cancer Paternal Aunt    Diabetes Sister    Hypertension Sister    Eczema Sister    Diabetes Brother    Hypertension Brother    Cardiomyopathy Brother    Alcohol abuse Brother    Allergies  Allergen Reactions   Enalapril Hives and Other (See Comments)    Angioedema face.   Ivp Dye [Iodinated Contrast Media] Hives   Lisinopril Shortness Of Breath   Shellfish Allergy Swelling and Other (See Comments)    patient tolerates shellfish by mouth without problem   Prior to Admission medications   Medication Sig Start Date End Date Taking? Authorizing Provider  AURYXIA 1 GM 210 MG(Fe) tablet TAKE 2 TABLETS  BY MOUTH WITH MEAL AND 1 TABLET WITH SNACK 10/14/18  Yes [provider]  blood glucose meter kit and supplies Dispense based on patient and insurance preference. Use up to four times daily as directed. (FOR ICD-10 E10.9, E11.9). 06/09/18  Yes Sowles, Drue Stager, MD  glucagon (GLUCAGON EMERGENCY) 1 MG injection Inject 1 mg into the vein once as needed for up to 1 dose. 01/03/19  Yes Sowles, Drue Stager, MD  glucose blood test strip One Touch Ultra, check fsbs 5 times daily 09/27/18  Yes Sowles, Drue Stager, MD  Lancets [facility] ULTRASOFT) lancets Use as instructed 06/09/18  Yes Sowles, Drue Stager, MD  losartan (COZAAR) 50 MG tablet Take 50 mg by mouth daily.  08/26/18  Yes Lateef, Munsoor, MD  NOVOFINE 32G X 6  MM MISC 1 EACH BY DOES NOT APPLY ROUTE 3 (THREE) TIMES DAILY WITH MEALS. 04/17/19  Yes Sowles, Drue Stager, MD  NOVOLOG FLEXPEN 100 UNIT/ML FlexPen INJECT 10-22 UNITS INTO THE SKIN 3 (THREE) TIMES DAILY WITH MEALS. 04/17/19  Yes Sowles, Drue Stager, MD  NON FORMULARY as needed (oils used via inhaling and diffuser).     [provider]   CT Hip Left Wo Contrast  Result Date: 09/28/2021 CLINICAL DATA:  Fall, left hip fracture EXAM: CT OF THE LEFT HIP WITHOUT CONTRAST TECHNIQUE: Multidetector CT imaging of the left hip was performed according to the standard protocol. Multiplanar CT image reconstructions were also generated. RADIATION DOSE REDUCTION: This exam was performed according to the departmental dose-optimization program which includes automated exposure control, adjustment of the mA and/or kV according to patient size and/or use of iterative reconstruction technique. COMPARISON:  X-ray 09/28/2021, CT 06/09/2019 FINDINGS: Bones/Joint/Cartilage Marked diffuse osteopenia. Acute transcervical fracture through the left femoral neck with significant varus angulation. No definite intertrochanteric involvement. Hip joint alignment is maintained without dislocation. Acute nondisplaced fracture of the right inferior pubic ramus (series 2, image 91). No additional fractures are identified. Moderate arthropathy of the pubic symphysis with new erosive or resorptive changes of the bilateral pubic bones. Symphysis is mildly widened compared to previous CT. Ligaments Suboptimally assessed by CT. Muscles and Tendons No acute musculotendinous abnormality by CT. Soft tissues Mild soft tissue swelling at the femoral neck fracture site. No organized hematoma. No acute findings within the visualized portion of the left hemipelvis. IMPRESSION: 1. Acute transcervical fracture through the LEFT femoral neck with varus angulation. 2. Acute nondisplaced fracture of the RIGHT inferior pubic ramus. 3. Moderate arthropathy of the pubic  symphysis with new resorptive or erosive changes of the bilateral pubic bones. This may be secondary to underlying renal osteodystrophy. Septic arthritis could have this appearance in the appropriate clinical setting. Mild widening at the pubic symphysis is favored secondary to underlying arthropathy/resorption rather than posttraumatic diastasis. 4. Marked bony demineralization. Electronically Signed   By: Davina Poke D.O.   On: 09/28/2021 18:56   DG Chest Port 1 View  Result Date: 09/28/2021 CLINICAL DATA:  Fall EXAM: PORTABLE CHEST 1 VIEW COMPARISON:  None. FINDINGS: The cardiomediastinal silhouette is enlarged in contour.RIGHT chest CVC tip terminating over the superior cavoatrial junction. Enlarged appearance of bilateral hilar contours most consistent with enlarged pulmonary arteries and likely underlying pulmonary arterial hypertension. Elevation of the RIGHT hemidiaphragm. No pleural effusion. No pneumothorax. No acute pleuroparenchymal abnormality. Visualized abdomen is unremarkable. Osteopenia. IMPRESSION: 1. Cardiomegaly with enlarged hilar contours likely reflecting enlarged pulmonary arteries and underlying pulmonary arterial hypertension. Electronically Signed   By: Valentino Saxon M.D.   On: 09/28/2021 17:05   DG  Hip Unilat W or Wo Pelvis 2-3 Views Left  Result Date: 09/28/2021 CLINICAL DATA:  Fall EXAM: DG HIP (WITH OR WITHOUT PELVIS) 2-3V LEFT COMPARISON:  None. FINDINGS: Osteopenia. There is a displaced fracture through the neck and intratrochanteric LEFT femur with superior translocation of the distal femur. Femoral head appears to be seated within the acetabulum although evaluation is limited by body habitus. No additional fracture noted. Limited assessment of the sacrum secondary to overlapping bowel contents and profound osteopenia. IMPRESSION: Displaced foreshortened fracture of the neck and inter trochanteric LEFT femur. Electronically Signed   By: Valentino Saxon M.D.    On: 09/28/2021 17:04    Positive ROS: All other systems have been reviewed and were otherwise negative with the exception of those mentioned in the HPI and as above.  Physical Exam: General: Alert, no acute distress Cardiovascular: No pedal edema Respiratory: No cyanosis, no use of accessory musculature GI: No organomegaly, abdomen is soft and non-tender Skin: No lesions in the area of chief complaint Neurologic: Sensation intact distally Psychiatric: Patient is competent for consent with normal mood and affect Lymphatic: No axillary or cervical lymphadenopathy  MUSCULOSKELETAL: left hip short, externally rotated. Compartments soft. Good cap refill. Motor and sensory intact distally.  Assessment: Closed, displaced left hip femoral neck fracture  Plan: Plan left hip hemiarthroplasty, anterior approach. Will need HD today after surgery.  The diagnosis, risks, benefits and alternatives to treatment are all discussed in detail with the patient and family. Risks include but are not limited to bleeding, infection, deep vein thrombosis, pulmonary embolism, nerve or vascular injury, non-union, repeat operation, persistent pain, weakness, stiffness and death. She understands and is eager to proceed.     Lovell Sheehan, MD    09/29/2021 8:49 AM

## 2021-09-29 NOTE — Progress Notes (Signed)
Insulin unavailable from pharmacy at this time. Will hold D50 amp until insulin available; pre-op RN notified on report we are awaiting insulin.

## 2021-09-29 NOTE — Progress Notes (Signed)
[facility]  ROUNDING NOTE   Subjective:  Patient very well-known to [place]. She presents with displaced subcapital fracture of the left hip. She underwent left hip anterior hemiarthroplasty. Potassium 5.3 preop. We are planning for hemodialysis treatment today.   Objective:  Vital signs in last 24 hours:  Temp:  [98.6 F (37 C)] 98.6 F (37 C) (01/30 1707) Pulse Rate:  [75-96] 95 (01/30 1728) Resp:  [13-24] 24 (01/30 1728) BP: (141-184)/(76-92) 162/78 (01/30 1715) SpO2:  [90 %-100 %] 94 % (01/30 1728)  Weight change:  Filed Weights   09/28/21 1554  Weight: 87 kg    Intake/Output: No intake/output data recorded.   Intake/Output this shift:  Total I/O In: -  Out: 300 [Blood:300]  Physical Exam: General: Mild distress from pain in left hip  Head: Normocephalic, atraumatic. Moist oral mucosal membranes  Eyes: Anicteric  Neck: Supple  Lungs:  Clear to auscultation, normal effort  Heart: S1S2 no rubs  Abdomen:  Soft, nontender, bowel sounds present  Extremities: Trace peripheral edema.  Neurologic: Awake, alert, following commands  Skin: No acute rash  Access: IJ PermCath    Basic Metabolic Panel: Recent Labs  Lab 09/28/21 1556 09/29/21 0630  NA 137 135  K 4.5 5.3*  CL 94* 93*  CO2 27 25  GLUCOSE 121* 130*  BUN 59* 69*  CREATININE 7.75* 8.78*  CALCIUM 10.0 9.4  MG  --  2.4  PHOS  --  6.3*    Liver Function Tests: No results for input(s): AST, ALT, ALKPHOS, BILITOT, PROT, ALBUMIN in the last 168 hours. No results for input(s): LIPASE, AMYLASE in the last 168 hours. No results for input(s): AMMONIA in the last 168 hours.  CBC: Recent Labs  Lab 09/28/21 1556 09/29/21 0630  WBC 4.6 5.4  NEUTROABS 3.4  --   HGB 10.0* 9.9*  HCT 31.3* 30.2*  MCV 101.3* 97.4  PLT 168 158    Cardiac Enzymes: No results for input(s): CKTOTAL, CKMB, CKMBINDEX, TROPONINI in the last 168 hours.  BNP: Invalid input(s): POCBNP  CBG: Recent Labs  Lab  09/28/21 2306 09/29/21 0746 09/29/21 1210 09/29/21 1709  GLUCAP 203* 128* 153* 170*    Microbiology: Results for orders placed or performed during the hospital encounter of 09/28/21  Resp Panel by RT-PCR (Flu A&B, Covid) Nasopharyngeal Swab     Status: None   Collection Time: 09/28/21  3:50 PM   Specimen: Nasopharyngeal Swab; Nasopharyngeal(NP) swabs in vial transport medium  Result Value Ref Range Status   SARS Coronavirus 2 by RT PCR NEGATIVE NEGATIVE Final    Comment: (NOTE) SARS-CoV-2 target nucleic acids are NOT DETECTED.  The SARS-CoV-2 RNA is generally detectable in upper respiratory specimens during the acute phase of infection. The lowest concentration of SARS-CoV-2 viral copies this assay can detect is 138 copies/mL. A negative result does not preclude SARS-Cov-2 infection and should not be used as the sole basis for treatment or other patient management decisions. A negative result may occur with  improper specimen collection/handling, submission of specimen other than nasopharyngeal swab, presence of viral mutation(s) within the areas targeted by this assay, and inadequate number of viral copies(<138 copies/mL). A negative result must be combined with clinical observations, patient history, and epidemiological information. The expected result is Negative.  Fact Sheet for Patients:  EntrepreneurPulse.com.au  Fact Sheet for Healthcare Providers:  IncredibleEmployment.be  This test is no t yet approved or cleared by the [place] FDA and  has been authorized for detection and/or  diagnosis of SARS-CoV-2 by FDA under an Emergency Use Authorization (EUA). This EUA will remain  in effect (meaning this test can be used) for the duration of the COVID-19 declaration under Section 564(b)(1) of the Act, 21 U.S.C.section 360bbb-3(b)(1), unless the authorization is terminated  or revoked sooner.       Influenza A by PCR NEGATIVE  NEGATIVE Final   Influenza B by PCR NEGATIVE NEGATIVE Final    Comment: (NOTE) The Xpert Xpress SARS-CoV-2/FLU/RSV plus assay is intended as an aid in the diagnosis of influenza from Nasopharyngeal swab specimens and should not be used as a sole basis for treatment. Nasal washings and aspirates are unacceptable for Xpert Xpress SARS-CoV-2/FLU/RSV testing.  Fact Sheet for Patients: EntrepreneurPulse.com.au  Fact Sheet for Healthcare Providers: IncredibleEmployment.be  This test is not yet approved or cleared by the [place] FDA and has been authorized for detection and/or diagnosis of SARS-CoV-2 by FDA under an Emergency Use Authorization (EUA). This EUA will remain in effect (meaning this test can be used) for the duration of the COVID-19 declaration under Section 564(b)(1) of the Act, 21 U.S.C. section 360bbb-3(b)(1), unless the authorization is terminated or revoked.  Performed at [facility], [address] [place], [place] [address]   Surgical PCR screen     Status: None   Collection Time: 09/29/21  8:13 AM   Specimen: Nasal Mucosa; Nasal Swab  Result Value Ref Range Status   MRSA, PCR NEGATIVE NEGATIVE Final   Staphylococcus aureus NEGATIVE NEGATIVE Final    Comment: (NOTE) The Xpert SA Assay (FDA approved for NASAL specimens in patients 4 years of age and older), is one component of a comprehensive surveillance program. It is not intended to diagnose infection nor to guide or monitor treatment. Performed at [facility], [address] [place], [place] [address]     Coagulation Studies: No results for input(s): LABPROT, INR in the last 72 hours.  Urinalysis: No results for input(s): COLORURINE, LABSPEC, PHURINE, GLUCOSEU, HGBUR, BILIRUBINUR, KETONESUR, PROTEINUR, UROBILINOGEN, NITRITE, LEUKOCYTESUR in the last 72 hours.  Invalid input(s): APPERANCEUR    Imaging: CT Hip Left Wo Contrast  Result  Date: 09/28/2021 CLINICAL DATA:  Fall, left hip fracture EXAM: CT OF THE LEFT HIP WITHOUT CONTRAST TECHNIQUE: Multidetector CT imaging of the left hip was performed according to the standard protocol. Multiplanar CT image reconstructions were also generated. RADIATION DOSE REDUCTION: This exam was performed according to the departmental dose-optimization program which includes automated exposure control, adjustment of the mA and/or kV according to patient size and/or use of iterative reconstruction technique. COMPARISON:  X-ray 09/28/2021, CT 06/09/2019 FINDINGS: Bones/Joint/Cartilage Marked diffuse osteopenia. Acute transcervical fracture through the left femoral neck with significant varus angulation. No definite intertrochanteric involvement. Hip joint alignment is maintained without dislocation. Acute nondisplaced fracture of the right inferior pubic ramus (series 2, image 91). No additional fractures are identified. Moderate arthropathy of the pubic symphysis with new erosive or resorptive changes of the bilateral pubic bones. Symphysis is mildly widened compared to previous CT. Ligaments Suboptimally assessed by CT. Muscles and Tendons No acute musculotendinous abnormality by CT. Soft tissues Mild soft tissue swelling at the femoral neck fracture site. No organized hematoma. No acute findings within the visualized portion of the left hemipelvis. IMPRESSION: 1. Acute transcervical fracture through the LEFT femoral neck with varus angulation. 2. Acute nondisplaced fracture of the RIGHT inferior pubic ramus. 3. Moderate arthropathy of the pubic symphysis with new resorptive or erosive changes of the bilateral pubic bones.  This may be secondary to underlying renal osteodystrophy. Septic arthritis could have this appearance in the appropriate clinical setting. Mild widening at the pubic symphysis is favored secondary to underlying arthropathy/resorption rather than posttraumatic diastasis. 4. Marked bony  demineralization. Electronically Signed   By: Davina Poke D.O.   On: 09/28/2021 18:56   DG Chest Port 1 View  Result Date: 09/28/2021 CLINICAL DATA:  Fall EXAM: PORTABLE CHEST 1 VIEW COMPARISON:  None. FINDINGS: The cardiomediastinal silhouette is enlarged in contour.RIGHT chest CVC tip terminating over the superior cavoatrial junction. Enlarged appearance of bilateral hilar contours most consistent with enlarged pulmonary arteries and likely underlying pulmonary arterial hypertension. Elevation of the RIGHT hemidiaphragm. No pleural effusion. No pneumothorax. No acute pleuroparenchymal abnormality. Visualized abdomen is unremarkable. Osteopenia. IMPRESSION: 1. Cardiomegaly with enlarged hilar contours likely reflecting enlarged pulmonary arteries and underlying pulmonary arterial hypertension. Electronically Signed   By: Valentino Saxon M.D.   On: 09/28/2021 17:05   DG C-Arm 1-60 Min-No Report  Result Date: 09/29/2021 Fluoroscopy was utilized by the requesting physician.  No radiographic interpretation.   DG HIP UNILAT WITH PELVIS 1V LEFT  Result Date: 09/29/2021 CLINICAL DATA:  Left hip arthroplasty, fracture left femur EXAM: DG HIP (WITH OR WITHOUT PELVIS) 1V*L* COMPARISON:  09/28/2021 FINDINGS: Fluoroscopic images show interval left hip arthroplasty. Fluoroscopic time was 6 seconds. Radiation dose is 0.67 mGy. IMPRESSION: Fluoroscopic assistance was provided for left hip arthroplasty. Electronically Signed   By: Elmer Picker M.D.   On: 09/29/2021 17:02   DG Hip Unilat W or Wo Pelvis 2-3 Views Left  Result Date: 09/28/2021 CLINICAL DATA:  Fall EXAM: DG HIP (WITH OR WITHOUT PELVIS) 2-3V LEFT COMPARISON:  None. FINDINGS: Osteopenia. There is a displaced fracture through the neck and intratrochanteric LEFT femur with superior translocation of the distal femur. Femoral head appears to be seated within the acetabulum although evaluation is limited by body habitus. No additional fracture  noted. Limited assessment of the sacrum secondary to overlapping bowel contents and profound osteopenia. IMPRESSION: Displaced foreshortened fracture of the neck and inter trochanteric LEFT femur. Electronically Signed   By: Valentino Saxon M.D.   On: 09/28/2021 17:04     Medications:    tranexamic acid     [MAR Hold] tranexamic acid      [MAR Hold] dextrose  1 ampule Intravenous Once   [MAR Hold] dextrose  1 ampule Intravenous Once   [MAR Hold] ferric citrate  420 mg Oral TID WC   And   [MAR Hold] ferric citrate  210 mg Oral With snacks   [MAR Hold] insulin aspart  0-5 Units Subcutaneous QHS   [MAR Hold] insulin aspart  0-6 Units Subcutaneous TID WC   [MAR Hold] insulin aspart  10 Units Intravenous Once   [MAR Hold] losartan  50 mg Oral Daily   [MAR Hold] magnesium oxide  200 mg Oral BID   [MAR Hold] methocarbamol  500 mg Oral TID   fentaNYL (SUBLIMAZE) injection, [MAR Hold] hydrALAZINE, [MAR Hold] HYDROcodone-acetaminophen, [MAR Hold]  HYDROmorphone (DILAUDID) injection, [MAR Hold] labetalol, [MAR Hold] melatonin, oxyCODONE **OR** oxyCODONE  Assessment/ Plan:  56 y.o. female with past medical history of ESRD on HD undergoing training for HHD, anemia of chronic kidney disease, severe secondary hyperparathyroidism, hypertension, diabetes mellitus type 1, peripheral neuropathy, hyperlipidemia who presents now with left hip fracture.  1.  ESRD currently undergoing training for HHD.  She was undergoing treatment training for home hemodialysis.  Unfortunately this will need to be positive we will  switch the patient back to hemodialysis for now.  We will plan for hemodialysis treatment today for 2 hours.  2.  Anemia of chronic kidney disease.  Hemoglobin 9.9.  Patient has decided to hold off on erythropoietin stimulating agents.  3.  Secondary hyperparathyroidism.  Has severe underlying secondary hyperparathyroidism.  Recently did agree to undergo evaluation for parathyroidectomy.  We will  need to coordinate this as an outpatient again.  4.  Hypokalemia.  Serum potassium 5.3.  Should be corrected with dialysis treatment today.   LOS: 1 Phylliss Strege 1/30/20235:31 PM

## 2021-09-29 NOTE — Op Note (Signed)
09/29/2021  5:03 PM  PATIENT:  [name]   MRN: [id number]  PRE-OPERATIVE DIAGNOSIS:  Displaced Subcapital fracture left hip   POST-OPERATIVE DIAGNOSIS: Same  Procedure: Left Hip Anterior Hip Hemiarthroplasty   Surgeon: Elyn Aquas. Harlow Mares, MD   Assist: Carlynn Spry, PA-C  Anesthesia: Spinal   EBL: 300 mL   Specimens: None   Drains: None   Components used: A size 7 Polarstem [employer], a 41 mm by 22 mm +4 mm bipolar head    Description of the procedure in detail: After informed consent was obtained and the appropriate extremity marked in the pre-operative holding area, the patient was taken to the operating room and placed in the supine position on the fracture table. All pressure points were well padded and bilateral lower extremities were place in traction spars. The hip was prepped and draped in standard sterile fashion. A spinal anesthetic had been delivered by the anesthesia team. The skin and subcutaneous tissues were injected with a mixture of Marcaine with epinephrine for post-operative pain. A longitudinal incision approximately 10 cm in length was carried out from the anterior superior iliac spine to the greater trochanter. The tensor fascia was divided and blunt dissection was taken down to the level of the joint capsule. The lateral circumflex vessels were cauterized. Deep retractors were placed and a portion of the anterior capsule was excised. Using fluoroscopy the neck cut was planned and carried out with a sagittal saw. The head was passed from the field with use of a corkscrew and hip skid. Deep retractors were placed along the acetabulum and bony and soft tissue debris was removed.   Attention was then turned to the proximal femur. The leg was placed in extension and external rotation. The canal was opened and sequentially broached to a size 7. The trial components were placed and the hip relocated. The components were found to be in good position using  fluoroscopy. The hip was dislocated and the trial components removed. The final components were impacted in to position and the hip relocated. The final components were again check with fluoroscopy and found to be in good position. Hemostasis was achieved with electrocautery. The deep capsule was injected with Marcaine and epinephrine. The wound was irrigated with bacitracin laced normal saline and the tensor fascia closed with #2 Quill suture. The subcutaneous tissues were closed with 2-0 vicryl and staples for the skin. A sterile dressing was applied and an abduction pillow. Patient tolerated the procedure well and there were no apparent complication. Patient was taken to the recovery room in good condition.    Elyn Aquas. Harlow Mares, MD  09/29/2021 5:03 PM

## 2021-09-29 NOTE — Transfer of Care (Signed)
Immediate Anesthesia Transfer of Care Note  Patient: [name]  Procedure(s) Performed: ARTHROPLASTY BIPOLAR HIP (HEMIARTHROPLASTY) (Left: Hip)  Patient Location: PACU  Anesthesia Type:General  Level of Consciousness: drowsy  Airway & Oxygen Therapy: Patient Spontanous Breathing and Patient connected to face mask oxygen  Post-op Assessment: Report given to RN and Post -op Vital signs reviewed and stable  Post vital signs: Reviewed and stable  Last Vitals:  Vitals Value Taken Time  BP 168/86 09/29/21 1706  Temp    Pulse 91 09/29/21 1711  Resp 24 09/29/21 1711  SpO2 100 % 09/29/21 1711  Vitals shown include unvalidated device data.  Last Pain:  Vitals:   09/29/21 1342  TempSrc:   PainSc: 6          Complications: No notable events documented.

## 2021-09-29 NOTE — Progress Notes (Signed)
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   Vitals in the emergency department showed temperature of 97.8, respiration rate of 18, heart rate of 99, blood pressure 186/92, SPO2 of 99% on room air. Labs in the emergency department showed serum sodium 137, potassium 4.5, chloride 94, bicarb 27, BUN of 59, serum creatinine of seven 7.75, nonfasting blood glucose 121, GFR of 6. WBC was 4.6, hemoglobin 10, platelets of 168 COVID/influenza A/influenza B PCR were negative.   ED provider ordered a left unilateral with or without pelvis x-ray and portable chest x-ray. Unilateral hip x-ray showed left femur fracture. Follow-up CT redemonstrated left femur fracture at the femoral neck and associated right pelvic fracture Orthopedics and nephrology on consult Plan for operative fixation on 1/30  Assessment & Plan:   Principal Problem:   Femur fracture, left (HCC) Active Problems:   Dyslipidemia   Secondary hyperparathyroidism of renal origin ([place])   Diabetic neuropathy ([place])   End stage renal disease on dialysis ([place])   Type 1 diabetes mellitus with chronic kidney disease on chronic dialysis [facility])   Essential hypertension  Femur fracture, left ([place]) Assessment & Plan  - Orthopedic service has been consulted, appreciate further recommendation Plan: Operative fixation with orthopedics today As needed pain management N.p.o. for now Hold chemoprophylaxis PT OT to start postop day #1   Essential hypertension Assessment & Plan - Resumed home amlodipine 5 mg twice daily, losartan 50 mg daily -  Labetalol injection 5 mg every 2 hours as needed for SBP greater than 170, 4 doses ordered   Type 1 diabetes mellitus with chronic kidney disease on chronic dialysis [facility]) Assessment & Plan - Per endocrinology outpatient note on 2022, patient takes glargine 15 units subcutaneous nightly, however she states that her insurance does not pay for this insulin therefore she is only on sliding scale at this time - Insulin SSI with at bedtime coverage ordered, end-stage renal disease dosing   End stage renal disease on dialysis [facility]) Hyperkalemia Assessment & Plan - Currently via right IJ - Patient's current regimen is Monday, Tuesday, Wednesday, Thursday, Friday for 2 hours each day as patient is currently being trained to get home hemodialysis Plan: HD today after OR 10 units IV insulin/1 amp D50 ordered for hyperkalemia treatment Discussed with Dr. Holley Raring   DVT prophylaxis: SCD Code Status: Full Family Communication:Husband at bedside Disposition Plan: Status is: Inpatient  Remains inpatient appropriate because: Hip fracture.  Plan for operative repair       Level of care: Telemetry Cardiac  Consultants:  Orthopedics Nephrology  Procedures:  Hip fracture repair 1/30  Antimicrobials: None   Subjective: Seen and examined.  Endorses severe pain upon movement of left hip  Objective: Vitals:   09/29/21 0800 09/29/21 0900 09/29/21 1000 09/29/21 1026  BP:  (!) 160/81 (!) 151/81   Pulse:  90 92 88  Resp:  17 13 14   Temp:      TempSrc:      SpO2: 93% 93%  99%  Weight:      Height:       No intake or output data in the 24 hours ending 09/29/21 1303 Filed Weights   09/28/21  1554  Weight: 87 kg    Examination:  General exam: No acute distress Respiratory system: Lungs clear.  Normal work of breathing.  Room air Cardiovascular system: S1-S2, RRR, no murmurs, no pedal edema Gastrointestinal system: Soft, NT/ND, normal bowel sounds Central nervous system: Alert and  oriented. No focal neurological deficits. Extremities: Symmetric 5 x 5 power. Skin: No rashes, lesions or ulcers Psychiatry: Judgement and insight appear normal. Mood & affect appropriate.     Data Reviewed: I have personally reviewed following labs and imaging studies  CBC: Recent Labs  Lab 09/28/21 1556 09/29/21 0630  WBC 4.6 5.4  NEUTROABS 3.4  --   HGB 10.0* 9.9*  HCT 31.3* 30.2*  MCV 101.3* 97.4  PLT 168 469   Basic Metabolic Panel: Recent Labs  Lab 09/28/21 1556 09/29/21 0630  NA 137 135  K 4.5 5.3*  CL 94* 93*  CO2 27 25  GLUCOSE 121* 130*  BUN 59* 69*  CREATININE 7.75* 8.78*  CALCIUM 10.0 9.4  MG  --  2.4  PHOS  --  6.3*   GFR: Estimated Creatinine Clearance: 8 mL/min (A) (by C-G formula based on SCr of 8.78 mg/dL (H)). Liver Function Tests: No results for input(s): AST, ALT, ALKPHOS, BILITOT, PROT, ALBUMIN in the last 168 hours. No results for input(s): LIPASE, AMYLASE in the last 168 hours. No results for input(s): AMMONIA in the last 168 hours. Coagulation Profile: No results for input(s): INR, PROTIME in the last 168 hours. Cardiac Enzymes: No results for input(s): CKTOTAL, CKMB, CKMBINDEX, TROPONINI in the last 168 hours. BNP (last 3 results) No results for input(s): PROBNP in the last 8760 hours. HbA1C: No results for input(s): HGBA1C in the last 72 hours. CBG: Recent Labs  Lab 09/28/21 2306 09/29/21 0746 09/29/21 1210  GLUCAP 203* 128* 153*   Lipid Profile: No results for input(s): CHOL, HDL, LDLCALC, TRIG, CHOLHDL, LDLDIRECT in the last 72 hours. Thyroid Function Tests: Recent Labs    09/29/21 0630  TSH 2.961   Anemia Panel: No results for input(s): VITAMINB12, FOLATE, FERRITIN, TIBC, IRON, RETICCTPCT in the last 72 hours. Sepsis Labs: No results for input(s): PROCALCITON, LATICACIDVEN in the last 168 hours.  Recent Results (from the past 240 hour(s))  Resp Panel by RT-PCR (Flu A&B, Covid) Nasopharyngeal Swab     Status: None    Collection Time: 09/28/21  3:50 PM   Specimen: Nasopharyngeal Swab; Nasopharyngeal(NP) swabs in vial transport medium  Result Value Ref Range Status   SARS Coronavirus 2 by RT PCR NEGATIVE NEGATIVE Final    Comment: (NOTE) SARS-CoV-2 target nucleic acids are NOT DETECTED.  The SARS-CoV-2 RNA is generally detectable in upper respiratory specimens during the acute phase of infection. The lowest concentration of SARS-CoV-2 viral copies this assay can detect is 138 copies/mL. A negative result does not preclude SARS-Cov-2 infection and should not be used as the sole basis for treatment or other patient management decisions. A negative result may occur with  improper specimen collection/handling, submission of specimen other than nasopharyngeal swab, presence of viral mutation(s) within the areas targeted by this assay, and inadequate number of viral copies(<138 copies/mL). A negative result must be combined with clinical observations, patient history, and epidemiological information. The expected result is Negative.  Fact Sheet for Patients:  EntrepreneurPulse.com.au  Fact Sheet for Healthcare Providers:  IncredibleEmployment.be  This test is no t yet approved or cleared by the [place] FDA and  has been authorized for detection and/or diagnosis of SARS-CoV-2 by FDA  under an Emergency Use Authorization (EUA). This EUA will remain  in effect (meaning this test can be used) for the duration of the COVID-19 declaration under Section 564(b)(1) of the Act, 21 U.S.C.section 360bbb-3(b)(1), unless the authorization is terminated  or revoked sooner.       Influenza A by PCR NEGATIVE NEGATIVE Final   Influenza B by PCR NEGATIVE NEGATIVE Final    Comment: (NOTE) The Xpert Xpress SARS-CoV-2/FLU/RSV plus assay is intended as an aid in the diagnosis of influenza from Nasopharyngeal swab specimens and should not be used as a sole basis for treatment.  Nasal washings and aspirates are unacceptable for Xpert Xpress SARS-CoV-2/FLU/RSV testing.  Fact Sheet for Patients: EntrepreneurPulse.com.au  Fact Sheet for Healthcare Providers: IncredibleEmployment.be  This test is not yet approved or cleared by the [place] FDA and has been authorized for detection and/or diagnosis of SARS-CoV-2 by FDA under an Emergency Use Authorization (EUA). This EUA will remain in effect (meaning this test can be used) for the duration of the COVID-19 declaration under Section 564(b)(1) of the Act, 21 U.S.C. section 360bbb-3(b)(1), unless the authorization is terminated or revoked.  Performed at [facility], [address], [place], [place] [address]   Surgical PCR screen     Status: None   Collection Time: 09/29/21  8:13 AM   Specimen: Nasal Mucosa; Nasal Swab  Result Value Ref Range Status   MRSA, PCR NEGATIVE NEGATIVE Final   Staphylococcus aureus NEGATIVE NEGATIVE Final    Comment: (NOTE) The Xpert SA Assay (FDA approved for NASAL specimens in patients 20 years of age and older), is one component of a comprehensive surveillance program. It is not intended to diagnose infection nor to guide or monitor treatment. Performed at [facility], [address], [place], [place] [address]          Radiology Studies: CT Hip Left Wo Contrast  Result Date: 09/28/2021 CLINICAL DATA:  Fall, left hip fracture EXAM: CT OF THE LEFT HIP WITHOUT CONTRAST TECHNIQUE: Multidetector CT imaging of the left hip was performed according to the standard protocol. Multiplanar CT image reconstructions were also generated. RADIATION DOSE REDUCTION: This exam was performed according to the departmental dose-optimization program which includes automated exposure control, adjustment of the mA and/or kV according to patient size and/or use of iterative reconstruction technique. COMPARISON:  X-ray 09/28/2021, CT  06/09/2019 FINDINGS: Bones/Joint/Cartilage Marked diffuse osteopenia. Acute transcervical fracture through the left femoral neck with significant varus angulation. No definite intertrochanteric involvement. Hip joint alignment is maintained without dislocation. Acute nondisplaced fracture of the right inferior pubic ramus (series 2, image 91). No additional fractures are identified. Moderate arthropathy of the pubic symphysis with new erosive or resorptive changes of the bilateral pubic bones. Symphysis is mildly widened compared to previous CT. Ligaments Suboptimally assessed by CT. Muscles and Tendons No acute musculotendinous abnormality by CT. Soft tissues Mild soft tissue swelling at the femoral neck fracture site. No organized hematoma. No acute findings within the visualized portion of the left hemipelvis. IMPRESSION: 1. Acute transcervical fracture through the LEFT femoral neck with varus angulation. 2. Acute nondisplaced fracture of the RIGHT inferior pubic ramus. 3. Moderate arthropathy of the pubic symphysis with new resorptive or erosive changes of the bilateral pubic bones. This may be secondary to underlying renal osteodystrophy. Septic arthritis could have this appearance in the appropriate clinical setting. Mild widening at the pubic symphysis is favored secondary to underlying arthropathy/resorption rather than posttraumatic diastasis. 4. Marked bony demineralization. Electronically Signed  By: Davina Poke D.O.   On: 09/28/2021 18:56   DG Chest Port 1 View  Result Date: 09/28/2021 CLINICAL DATA:  Fall EXAM: PORTABLE CHEST 1 VIEW COMPARISON:  None. FINDINGS: The cardiomediastinal silhouette is enlarged in contour.RIGHT chest CVC tip terminating over the superior cavoatrial junction. Enlarged appearance of bilateral hilar contours most consistent with enlarged pulmonary arteries and likely underlying pulmonary arterial hypertension. Elevation of the RIGHT hemidiaphragm. No pleural effusion.  No pneumothorax. No acute pleuroparenchymal abnormality. Visualized abdomen is unremarkable. Osteopenia. IMPRESSION: 1. Cardiomegaly with enlarged hilar contours likely reflecting enlarged pulmonary arteries and underlying pulmonary arterial hypertension. Electronically Signed   By: Valentino Saxon M.D.   On: 09/28/2021 17:05   DG Hip Unilat W or Wo Pelvis 2-3 Views Left  Result Date: 09/28/2021 CLINICAL DATA:  Fall EXAM: DG HIP (WITH OR WITHOUT PELVIS) 2-3V LEFT COMPARISON:  None. FINDINGS: Osteopenia. There is a displaced fracture through the neck and intratrochanteric LEFT femur with superior translocation of the distal femur. Femoral head appears to be seated within the acetabulum although evaluation is limited by body habitus. No additional fracture noted. Limited assessment of the sacrum secondary to overlapping bowel contents and profound osteopenia. IMPRESSION: Displaced foreshortened fracture of the neck and inter trochanteric LEFT femur. Electronically Signed   By: Valentino Saxon M.D.   On: 09/28/2021 17:04        Scheduled Meds:  dextrose  1 ampule Intravenous Once   ferric citrate  420 mg Oral TID WC   And   ferric citrate  210 mg Oral With snacks   insulin aspart  0-5 Units Subcutaneous QHS   insulin aspart  0-6 Units Subcutaneous TID WC   insulin aspart  5 Units Intravenous Once   losartan  50 mg Oral Daily   magnesium oxide  200 mg Oral BID   methocarbamol  500 mg Oral TID   Continuous Infusions:   ceFAZolin (ANCEF) IV     tranexamic acid       LOS: 1 day    Time spent: 35 minutes    Sidney Ace, MD [facility]   If 7PM-7AM, please contact night-coverage  09/29/2021, 1:03 PM

## 2021-09-29 NOTE — Progress Notes (Signed)
Spoke to anesthesia about treating elevated potassium level. Dr. Amie Critchley stated patient will go to dialysis after surgery and patient is okay to proceed to surgery without treatment for elevated potassium levels of 5.3.

## 2021-09-29 NOTE — Anesthesia Procedure Notes (Signed)
Procedure Name: Intubation Date/Time: 09/29/2021 3:22 PM Performed by: Cammie Sickle, CRNA Pre-anesthesia Checklist: Patient identified, Emergency Drugs available, Suction available and Patient being monitored Patient Re-evaluated:Patient Re-evaluated prior to induction Oxygen Delivery Method: Circle system utilized Preoxygenation: Pre-oxygenation with 100% oxygen Induction Type: IV induction Ventilation: Mask ventilation without difficulty Laryngoscope Size: McGraph and 3 Grade View: Grade I Tube type: Oral Tube size: 7.0 mm Number of attempts: 1 Airway Equipment and Method: Stylet Placement Confirmation: ETT inserted through vocal cords under direct vision, positive ETCO2 and breath sounds checked- equal and bilateral Secured at: 22 cm Tube secured with: Tape Dental Injury: Teeth and Oropharynx as per pre-operative assessment  Comments: Pt. Intubated in her bed for her comfort.

## 2021-09-29 NOTE — Anesthesia Preprocedure Evaluation (Addendum)
Anesthesia Evaluation  Patient identified by MRN, date of birth, ID band Patient awake    Reviewed: Allergy & Precautions, NPO status , Patient's Chart, lab work & pertinent test results  History of Anesthesia Complications (+) PONV and history of anesthetic complications  Airway Mallampati: III  TM Distance: >3 FB Neck ROM: full    Dental  (+) Chipped   Pulmonary neg shortness of breath, asthma ,     + decreased breath sounds+ wheezing      Cardiovascular hypertension, (-) anginaNormal cardiovascular exam     Neuro/Psych PSYCHIATRIC DISORDERS  Neuromuscular disease CVA    GI/Hepatic negative GI ROS, Neg liver ROS, GERD  Controlled,  Endo/Other  diabetes, Type 2  Renal/GU DialysisRenal disease     Musculoskeletal   Abdominal   Peds  Hematology negative hematology ROS (+)   Anesthesia Other Findings Past Medical History: No date: Anemia     Comment:  vitamin d3 deficiency No date: Anxiety No date: Chronic kidney disease     Comment:  End Stage Renal Disease No date: Diabetes mellitus without complication (HCC) No date: GERD (gastroesophageal reflux disease)     Comment:  nothing over last few years No date: High serum parathyroid hormone (PTH)     Comment:  checked through Dialysis 2000: History of kidney stones No date: Hypertension No date: Neuromuscular disorder (HCC)     Comment:  neuropathy in feet No date: PONV (postoperative nausea and vomiting)     Comment:  severe nausea requiring many doses of post op               antiemetics 10/2017: Stroke [facility])     Comment:  thinks she had a series of mini strokes.right leg up to               right side of face were numb. no loss of consciousness  Past Surgical History: 2007: ABDOMINAL HYSTERECTOMY 12/08/2017: AV FISTULA INSERTION W/ RF MAGNETIC GUIDANCE; N/A     Comment:  Procedure: AV FISTULA INSERTION W/RF MAGNETIC GUIDANCE;               Surgeon:  Katha Cabal, MD;  Location: [place]              CV LAB;  Service: Cardiovascular;  Laterality: N/A; 2004: ROTATOR CUFF REPAIR; Right 2004: SHOULDER CLOSED REDUCTION; Right 02/15/2018: UPPER EXTREMITY VENOGRAPHY; Left     Comment:  Procedure: UPPER EXTREMITY VENOGRAPHY;  Surgeon:               Katha Cabal, MD;  Location: [place] CV LAB;               Service: Cardiovascular;  Laterality: Left;  BMI    Body Mass Index: 30.04 kg/m      Reproductive/Obstetrics negative OB ROS                             Anesthesia Physical Anesthesia Plan  ASA: 4  Anesthesia Plan: General ETT   Post-op Pain Management:    Induction: Intravenous  PONV Risk Score and Plan: Ondansetron, Dexamethasone, Midazolam, Treatment may vary due to age or medical condition, TIVA and Propofol infusion  Airway Management Planned: Oral ETT  Additional Equipment:   Intra-op Plan:   Post-operative Plan: Extubation in OR  Informed Consent: I have reviewed the patients History and Physical, chart, labs and discussed the procedure including the risks, benefits and  alternatives for the proposed anesthesia with the patient or authorized representative who has indicated his/her understanding and acceptance.     Dental Advisory Given  Plan Discussed with: Anesthesiologist, CRNA and Surgeon  Anesthesia Plan Comments: (Plan for dialysis after surgery per medicine team  Patient consented for risks of anesthesia including but not limited to:  - adverse reactions to medications - damage to eyes, teeth, lips or other oral mucosa - nerve damage due to positioning  - sore throat or hoarseness - Damage to heart, brain, nerves, lungs, other parts of body or loss of life  Patient voiced understanding.)      Anesthesia Quick Evaluation

## 2021-09-30 ENCOUNTER — Encounter: Payer: Self-pay | Admitting: Orthopedic Surgery

## 2021-09-30 DIAGNOSIS — S72102A Unspecified trochanteric fracture of left femur, initial encounter for closed fracture: Secondary | ICD-10-CM | POA: Diagnosis not present

## 2021-09-30 LAB — BASIC METABOLIC PANEL
Anion gap: 14 (ref 5–15)
BUN: 57 mg/dL — ABNORMAL HIGH (ref 6–20)
CO2: 25 mmol/L (ref 22–32)
Calcium: 8.4 mg/dL — ABNORMAL LOW (ref 8.9–10.3)
Chloride: 94 mmol/L — ABNORMAL LOW (ref 98–111)
Creatinine, Ser: 7.3 mg/dL — ABNORMAL HIGH (ref 0.44–1.00)
GFR, Estimated: 6 mL/min — ABNORMAL LOW (ref >60)
Glucose, Bld: 162 mg/dL — ABNORMAL HIGH (ref 70–99)
Potassium: 5.6 mmol/L — ABNORMAL HIGH (ref 3.5–5.1)
Sodium: 133 mmol/L — ABNORMAL LOW (ref 135–145)

## 2021-09-30 LAB — GLUCOSE, CAPILLARY
Glucose-Capillary: 168 mg/dL — ABNORMAL HIGH (ref 70–99)
Glucose-Capillary: 170 mg/dL — ABNORMAL HIGH (ref 70–99)
Glucose-Capillary: 187 mg/dL — ABNORMAL HIGH (ref 70–99)
Glucose-Capillary: 204 mg/dL — ABNORMAL HIGH (ref 70–99)
Glucose-Capillary: 237 mg/dL — ABNORMAL HIGH (ref 70–99)

## 2021-09-30 LAB — CBC
HCT: 23.7 % — ABNORMAL LOW (ref 36.0–46.0)
Hemoglobin: 7.7 g/dL — ABNORMAL LOW (ref 12.0–15.0)
MCH: 32.6 pg (ref 26.0–34.0)
MCHC: 32.5 g/dL (ref 30.0–36.0)
MCV: 100.4 fL — ABNORMAL HIGH (ref 80.0–100.0)
Platelets: 189 10*3/uL (ref 150–400)
RBC: 2.36 MIL/uL — ABNORMAL LOW (ref 3.87–5.11)
RDW: 13.3 % (ref 11.5–15.5)
WBC: 9.1 10*3/uL (ref 4.0–10.5)
nRBC: 0 % (ref 0.0–0.2)

## 2021-09-30 MED ORDER — METHOCARBAMOL 750 MG PO TABS
750.0000 mg | ORAL_TABLET | Freq: Three times a day (TID) | ORAL | Status: DC
  Administered 2021-09-30: 750 mg via ORAL
  Filled 2021-09-30 (×3): qty 1

## 2021-09-30 MED ORDER — PATIROMER SORBITEX CALCIUM 8.4 G PO PACK
16.8000 g | PACK | Freq: Once | ORAL | Status: DC
  Filled 2021-09-30: qty 2

## 2021-09-30 MED ORDER — RENA-VITE PO TABS
1.0000 | ORAL_TABLET | Freq: Every day | ORAL | Status: DC
  Administered 2021-09-30 – 2021-10-05 (×5): 1 via ORAL
  Filled 2021-09-30 (×8): qty 1

## 2021-09-30 MED ORDER — PATIROMER SORBITEX CALCIUM 8.4 G PO PACK
16.8000 g | PACK | Freq: Once | ORAL | Status: AC
  Administered 2021-09-30: 16.8 g via ORAL
  Filled 2021-09-30: qty 2

## 2021-09-30 MED ORDER — METHOCARBAMOL 500 MG PO TABS
750.0000 mg | ORAL_TABLET | Freq: Four times a day (QID) | ORAL | Status: DC
  Administered 2021-09-30 – 2021-10-06 (×12): 750 mg via ORAL
  Filled 2021-09-30 (×18): qty 2

## 2021-09-30 MED ORDER — NEPRO/CARBSTEADY PO LIQD
237.0000 mL | Freq: Two times a day (BID) | ORAL | Status: DC
  Administered 2021-10-01 – 2021-10-04 (×2): 237 mL via ORAL

## 2021-09-30 MED ORDER — CHLORHEXIDINE GLUCONATE CLOTH 2 % EX PADS
6.0000 | MEDICATED_PAD | Freq: Every day | CUTANEOUS | Status: DC
  Administered 2021-09-30 – 2021-10-06 (×7): 6 via TOPICAL

## 2021-09-30 MED ORDER — ACETAMINOPHEN 325 MG PO TABS
650.0000 mg | ORAL_TABLET | Freq: Once | ORAL | Status: DC
Start: 2021-09-30 — End: 2021-10-06
  Filled 2021-09-30: qty 2

## 2021-09-30 NOTE — Progress Notes (Signed)
Patient post-surgery for surgical repair of left hip fracture, patient scheduled for a shorten treatment via RIJ CVC. Catheter functions poorly, high arterial pressures, resulting in the use of Cath Flo and line reversal to achieve goal. Despite these measures, BFR remained at 300 post Cath Flo dwell. Targeted UF not met, total of 1029 fluid removed after second set-up complete. Patient pleasant, fully aware of CVC limitations, and anxious to reach  assigned room. Report given to primary nurse, transferred to room 249.

## 2021-09-30 NOTE — Anesthesia Postprocedure Evaluation (Signed)
Anesthesia Post Note  Patient: [name]  Procedure(s) Performed: ARTHROPLASTY BIPOLAR HIP (HEMIARTHROPLASTY) (Left: Hip)  Patient location during evaluation: PACU Anesthesia Type: General Level of consciousness: awake and alert Pain management: pain level controlled Vital Signs Assessment: post-procedure vital signs reviewed and stable Respiratory status: spontaneous breathing, nonlabored ventilation, respiratory function stable and patient connected to nasal cannula oxygen Cardiovascular status: blood pressure returned to baseline and stable Postop Assessment: no apparent nausea or vomiting Anesthetic complications: no   No notable events documented.   Last Vitals:  Vitals:   09/30/21 0003 09/30/21 0509  BP: (!) 102/59 (!) 123/57  Pulse: 95 91  Resp: 17 18  Temp: 36.7 C 36.7 C  SpO2: 97% 99%    Last Pain:  Vitals:   09/30/21 0500  TempSrc:   PainSc: 4                  Precious Haws Haileyann Staiger

## 2021-09-30 NOTE — Progress Notes (Signed)
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   Vitals in the emergency department showed temperature of 97.8, respiration rate of 18, heart rate of 99, blood pressure 186/92, SPO2 of 99% on room air. Labs in the emergency department showed serum sodium 137, potassium 4.5, chloride 94, bicarb 27, BUN of 59, serum creatinine of seven 7.75, nonfasting blood glucose 121, GFR of 6. WBC was 4.6, hemoglobin 10, platelets of 168 COVID/influenza A/influenza B PCR were negative.   ED provider ordered a left unilateral with or without pelvis x-ray and portable chest x-ray. Unilateral hip x-ray showed left femur fracture. Follow-up CT redemonstrated left femur fracture at the femoral neck and associated right pelvic fracture Orthopedics and nephrology on consult Status post left hip anterior hemiarthroplasty on 1/30  Assessment & Plan:   Principal Problem:   Femur fracture, left (HCC) Active Problems:   Dyslipidemia   Secondary hyperparathyroidism of renal origin ([place])   Diabetic neuropathy ([place])   End stage renal disease on dialysis ([place])   Type 1 diabetes mellitus with chronic kidney disease on chronic dialysis [facility])   Essential hypertension  Femur fracture, left ([place]) Assessment & Plan  - Orthopedic service has been consulted, appreciate further recommendation -Status post left hip hemiarthroplasty 1/30 Plan: Multimodal pain control Chemoprophylaxis Therapy evaluations Discharge planning   Essential hypertension Assessment & Plan - Resumed home amlodipine 5 mg twice daily, losartan 50 mg  daily - Labetalol injection 5 mg every 2 hours as needed for SBP greater than 170, 4 doses ordered   Type 1 diabetes mellitus with chronic kidney disease on chronic dialysis [facility]) Assessment & Plan - Per endocrinology outpatient note on 2022, patient takes glargine 15 units subcutaneous nightly, however she states that her insurance does not pay for this insulin therefore she is only on sliding scale at this time - Insulin SSI with at bedtime coverage ordered, end-stage renal disease dosing   End stage renal disease on dialysis [facility]) Hyperkalemia Assessment & Plan - Currently via right IJ - Patient's current regimen is Monday, Tuesday, Wednesday, Thursday, Friday for 2 hours each day as patient is currently being trained to get home hemodialysis -Status post hemodialysis after operating room on 1/30 Plan: Nephrology following for inpatient HD needs   DVT prophylaxis: SCD Code Status: Full Family Communication:Husband at bedside 1/30 Disposition Plan: Status is: Inpatient  Remains inpatient appropriate because: Hip fracture.  Postop day 1 status post hemiarthroplasty     Level of care: Med-Surg  Consultants:  Orthopedics Nephrology  Procedures:  Hip fracture repair 1/30  Antimicrobials: None   Subjective: Seen and examined.  Endorses muscle spasm in left lower extremity  Objective: Vitals:   09/29/21 2245 09/30/21 0003 09/30/21 0509 09/30/21 0746  BP: (!) 83/53 (!) 102/59 (!) 123/57 (!) 116/55  Pulse:  95 91 92  Resp: 16 17 18 18   Temp:  98 F (36.7 C) 98 F (36.7 C) 99.1 F (37.3 C)  TempSrc:  Oral    SpO2:  97% 99% 98%  Weight:      Height:  5\' 7"  (1.702 m)      Intake/Output Summary (Last 24 hours) at 09/30/2021 1149 Last data filed at  09/29/2021 2245 Gross per 24 hour  Intake --  Output 1329 ml  Net -1329 ml   Filed Weights   09/28/21 1554 09/29/21 1845  Weight: 87 kg 85.7 kg    Examination:  General exam: No acute distress.  Lying in  bed Respiratory system: Lungs clear.  Normal work of breathing.  Room air Cardiovascular system: S1-S2, RRR, no murmurs, no pedal edema Gastrointestinal system: Soft, NT/ND, normal bowel sounds Central nervous system: Alert and oriented. No focal neurological deficits. Extremities: Limited range of motion left lower extremity Skin: No rashes, lesions or ulcers Psychiatry: Judgement and insight appear normal. Mood & affect appropriate.     Data Reviewed: I have personally reviewed following labs and imaging studies  CBC: Recent Labs  Lab 09/28/21 1556 09/29/21 0630 09/30/21 0645  WBC 4.6 5.4 9.1  NEUTROABS 3.4  --   --   HGB 10.0* 9.9* 7.7*  HCT 31.3* 30.2* 23.7*  MCV 101.3* 97.4 100.4*  PLT 168 158 161   Basic Metabolic Panel: Recent Labs  Lab 09/28/21 1556 09/29/21 0630 09/30/21 0645  NA 137 135 133*  K 4.5 5.3* 5.6*  CL 94* 93* 94*  CO2 27 25 25   GLUCOSE 121* 130* 162*  BUN 59* 69* 57*  CREATININE 7.75* 8.78* 7.30*  CALCIUM 10.0 9.4 8.4*  MG  --  2.4  --   PHOS  --  6.3*  --    GFR: Estimated Creatinine Clearance: 9.6 mL/min (A) (by C-G formula based on SCr of 7.3 mg/dL (H)). Liver Function Tests: No results for input(s): AST, ALT, ALKPHOS, BILITOT, PROT, ALBUMIN in the last 168 hours. No results for input(s): LIPASE, AMYLASE in the last 168 hours. No results for input(s): AMMONIA in the last 168 hours. Coagulation Profile: No results for input(s): INR, PROTIME in the last 168 hours. Cardiac Enzymes: No results for input(s): CKTOTAL, CKMB, CKMBINDEX, TROPONINI in the last 168 hours. BNP (last 3 results) No results for input(s): PROBNP in the last 8760 hours. HbA1C: Recent Labs    09/29/21 0630  HGBA1C 6.2*   CBG: Recent Labs  Lab 09/29/21 0746 09/29/21 1210 09/29/21 1709 09/30/21 0006 09/30/21 0750  GLUCAP 128* 153* 170* 170* 168*   Lipid Profile: No results for input(s): CHOL, HDL, LDLCALC, TRIG, CHOLHDL, LDLDIRECT in the last 72  hours. Thyroid Function Tests: Recent Labs    09/29/21 0630  TSH 2.961   Anemia Panel: Recent Labs    09/29/21 0630  VITAMINB12 640   Sepsis Labs: No results for input(s): PROCALCITON, LATICACIDVEN in the last 168 hours.  Recent Results (from the past 240 hour(s))  Resp Panel by RT-PCR (Flu A&B, Covid) Nasopharyngeal Swab     Status: None   Collection Time: 09/28/21  3:50 PM   Specimen: Nasopharyngeal Swab; Nasopharyngeal(NP) swabs in vial transport medium  Result Value Ref Range Status   SARS Coronavirus 2 by RT PCR NEGATIVE NEGATIVE Final    Comment: (NOTE) SARS-CoV-2 target nucleic acids are NOT DETECTED.  The SARS-CoV-2 RNA is generally detectable in upper respiratory specimens during the acute phase of infection. The lowest concentration of SARS-CoV-2 viral copies this assay can detect is 138 copies/mL. A negative result does not preclude SARS-Cov-2 infection and should not be used as the sole basis for treatment or other patient management decisions. A negative result may occur with  improper specimen collection/handling, submission of specimen other than nasopharyngeal swab, presence of viral mutation(s) within the areas targeted by this assay, and inadequate number of  viral copies(<138 copies/mL). A negative result must be combined with clinical observations, patient history, and epidemiological information. The expected result is Negative.  Fact Sheet for Patients:  EntrepreneurPulse.com.au  Fact Sheet for Healthcare Providers:  IncredibleEmployment.be  This test is no t yet approved or cleared by the [place] FDA and  has been authorized for detection and/or diagnosis of SARS-CoV-2 by FDA under an Emergency Use Authorization (EUA). This EUA will remain  in effect (meaning this test can be used) for the duration of the COVID-19 declaration under Section 564(b)(1) of the Act, 21 U.S.C.section 360bbb-3(b)(1), unless the  authorization is terminated  or revoked sooner.       Influenza A by PCR NEGATIVE NEGATIVE Final   Influenza B by PCR NEGATIVE NEGATIVE Final    Comment: (NOTE) The Xpert Xpress SARS-CoV-2/FLU/RSV plus assay is intended as an aid in the diagnosis of influenza from Nasopharyngeal swab specimens and should not be used as a sole basis for treatment. Nasal washings and aspirates are unacceptable for Xpert Xpress SARS-CoV-2/FLU/RSV testing.  Fact Sheet for Patients: EntrepreneurPulse.com.au  Fact Sheet for Healthcare Providers: IncredibleEmployment.be  This test is not yet approved or cleared by the [place] FDA and has been authorized for detection and/or diagnosis of SARS-CoV-2 by FDA under an Emergency Use Authorization (EUA). This EUA will remain in effect (meaning this test can be used) for the duration of the COVID-19 declaration under Section 564(b)(1) of the Act, 21 U.S.C. section 360bbb-3(b)(1), unless the authorization is terminated or revoked.  Performed at [facility], [address], [place], [place] [address]   Surgical PCR screen     Status: None   Collection Time: 09/29/21  8:13 AM   Specimen: Nasal Mucosa; Nasal Swab  Result Value Ref Range Status   MRSA, PCR NEGATIVE NEGATIVE Final   Staphylococcus aureus NEGATIVE NEGATIVE Final    Comment: (NOTE) The Xpert SA Assay (FDA approved for NASAL specimens in patients 62 years of age and older), is one component of a comprehensive surveillance program. It is not intended to diagnose infection nor to guide or monitor treatment. Performed at [facility], [address], [place], [place] [address]          Radiology Studies: CT Hip Left Wo Contrast  Result Date: 09/28/2021 CLINICAL DATA:  Fall, left hip fracture EXAM: CT OF THE LEFT HIP WITHOUT CONTRAST TECHNIQUE: Multidetector CT imaging of the left hip was performed according to the standard  protocol. Multiplanar CT image reconstructions were also generated. RADIATION DOSE REDUCTION: This exam was performed according to the departmental dose-optimization program which includes automated exposure control, adjustment of the mA and/or kV according to patient size and/or use of iterative reconstruction technique. COMPARISON:  X-ray 09/28/2021, CT 06/09/2019 FINDINGS: Bones/Joint/Cartilage Marked diffuse osteopenia. Acute transcervical fracture through the left femoral neck with significant varus angulation. No definite intertrochanteric involvement. Hip joint alignment is maintained without dislocation. Acute nondisplaced fracture of the right inferior pubic ramus (series 2, image 91). No additional fractures are identified. Moderate arthropathy of the pubic symphysis with new erosive or resorptive changes of the bilateral pubic bones. Symphysis is mildly widened compared to previous CT. Ligaments Suboptimally assessed by CT. Muscles and Tendons No acute musculotendinous abnormality by CT. Soft tissues Mild soft tissue swelling at the femoral neck fracture site. No organized hematoma. No acute findings within the visualized portion of the left hemipelvis. IMPRESSION: 1. Acute transcervical fracture through the LEFT femoral neck with varus angulation. 2. Acute nondisplaced fracture  of the RIGHT inferior pubic ramus. 3. Moderate arthropathy of the pubic symphysis with new resorptive or erosive changes of the bilateral pubic bones. This may be secondary to underlying renal osteodystrophy. Septic arthritis could have this appearance in the appropriate clinical setting. Mild widening at the pubic symphysis is favored secondary to underlying arthropathy/resorption rather than posttraumatic diastasis. 4. Marked bony demineralization. Electronically Signed   By: Davina Poke D.O.   On: 09/28/2021 18:56   DG Chest Port 1 View  Result Date: 09/28/2021 CLINICAL DATA:  Fall EXAM: PORTABLE CHEST 1 VIEW  COMPARISON:  None. FINDINGS: The cardiomediastinal silhouette is enlarged in contour.RIGHT chest CVC tip terminating over the superior cavoatrial junction. Enlarged appearance of bilateral hilar contours most consistent with enlarged pulmonary arteries and likely underlying pulmonary arterial hypertension. Elevation of the RIGHT hemidiaphragm. No pleural effusion. No pneumothorax. No acute pleuroparenchymal abnormality. Visualized abdomen is unremarkable. Osteopenia. IMPRESSION: 1. Cardiomegaly with enlarged hilar contours likely reflecting enlarged pulmonary arteries and underlying pulmonary arterial hypertension. Electronically Signed   By: Valentino Saxon M.D.   On: 09/28/2021 17:05   DG C-Arm 1-60 Min-No Report  Result Date: 09/29/2021 Fluoroscopy was utilized by the requesting physician.  No radiographic interpretation.   DG HIP UNILAT WITH PELVIS 1V LEFT  Result Date: 09/29/2021 CLINICAL DATA:  Left hip arthroplasty, fracture left femur EXAM: DG HIP (WITH OR WITHOUT PELVIS) 1V*L* COMPARISON:  09/28/2021 FINDINGS: Fluoroscopic images show interval left hip arthroplasty. Fluoroscopic time was 6 seconds. Radiation dose is 0.67 mGy. IMPRESSION: Fluoroscopic assistance was provided for left hip arthroplasty. Electronically Signed   By: Elmer Picker M.D.   On: 09/29/2021 17:02   DG Hip Unilat W or Wo Pelvis 2-3 Views Left  Result Date: 09/28/2021 CLINICAL DATA:  Fall EXAM: DG HIP (WITH OR WITHOUT PELVIS) 2-3V LEFT COMPARISON:  None. FINDINGS: Osteopenia. There is a displaced fracture through the neck and intratrochanteric LEFT femur with superior translocation of the distal femur. Femoral head appears to be seated within the acetabulum although evaluation is limited by body habitus. No additional fracture noted. Limited assessment of the sacrum secondary to overlapping bowel contents and profound osteopenia. IMPRESSION: Displaced foreshortened fracture of the neck and inter trochanteric LEFT  femur. Electronically Signed   By: Valentino Saxon M.D.   On: 09/28/2021 17:04        Scheduled Meds:  acetaminophen  650 mg Oral Once   Chlorhexidine Gluconate Cloth  6 each Topical Daily   dextrose  1 ampule Intravenous Once   dextrose  1 ampule Intravenous Once   docusate sodium  100 mg Oral BID   ferric citrate  420 mg Oral TID WC   And   ferric citrate  210 mg Oral With snacks   insulin aspart  0-5 Units Subcutaneous QHS   insulin aspart  0-6 Units Subcutaneous TID WC   insulin aspart  10 Units Intravenous Once   [START ON 10/02/2021] losartan  50 mg Oral Daily   magnesium oxide  200 mg Oral BID   methocarbamol  750 mg Oral QID   Continuous Infusions:  lactated ringers 10 mL/hr at 09/30/21 0600     LOS: 2 days    Time spent: 35 minutes    Sidney Ace, MD [facility]   If 7PM-7AM, please contact night-coverage  09/30/2021, 11:49 AM

## 2021-09-30 NOTE — Evaluation (Signed)
Occupational Therapy Evaluation Patient Details Name: [name] MRN: [id number] DOB: 04/02/1966 Today's Date: 09/30/2021   History of Present Illness presented to ER secondary to L hip pain; noted with R inferior pubic ramus frature (WBAT), L femoral neck fracture s/p L hemiarthroplast (WBAT), 09/29/21.   Clinical Impression   [name] was seen for OT evaluation this date. Prior to hospital admission, pt was MOD I for mobility using 4WW. Pt lives with spouse in home c 5 STE. Pt presents to acute OT demonstrating impaired ADL performance and functional mobility 2/2 decreased activity tolerance and functional strength/ROM/balance deficits. Pt currently requires MOD A sup<>sit. MAX A for LBD seated EOB, good static sitting balance. SETUP + SUPERVISION seated grooming tasks. MOD A lateral scoot along EOB. Pt demonstrates improved blood pressure this session (sitting BP 118/57, MAP 75, HR 105). Pt agreeable to standing trial using bed rail and +2 assist from husband. MIN A x2 sit<>stand, tolerates <2 min with upright posture. Pt has excellent family support, instructed on HEP. Pt would benefit from skilled OT to address noted impairments and functional limitations (see below for any additional details). Upon hospital discharge, recommend AIR to maximize pt safety and return to PLOF.       Recommendations for follow up therapy are one component of a multi-disciplinary discharge planning process, led by the attending physician.  Recommendations may be updated based on patient status, additional functional criteria and insurance authorization.   Follow Up Recommendations  Acute inpatient rehab (3hours/day)    Assistance Recommended at Discharge Frequent or constant Supervision/Assistance  Patient can return home with the following Two people to help with walking and/or transfers;Two people to help with bathing/dressing/bathroom    Functional Status Assessment  Patient has had a recent decline in  their functional status and demonstrates the ability to make significant improvements in function in a reasonable and predictable amount of time.  Equipment Recommendations  Other (comment) (defer to next venue of care)    Recommendations for Other Services       Precautions / Restrictions Precautions Precautions: Fall Precaution Comments: L AVF, R IJ perm-cath Restrictions Weight Bearing Restrictions: Yes RLE Weight Bearing: Weight bearing as tolerated LLE Weight Bearing: Weight bearing as tolerated      Mobility Bed Mobility Overal bed mobility: Needs Assistance Bed Mobility: Supine to Sit, Sit to Supine     Supine to sit: Mod assist Sit to supine: Mod assist        Transfers Overall transfer level: Needs assistance Equipment used: 2 person hand held assist Transfers: Sit to/from Stand, Bed to chair/wheelchair/BSC Sit to Stand: Min assist, +2 physical assistance, From elevated surface          Lateral/Scoot Transfers: Mod assist General transfer comment: scoot along EOB. tolerates ~2 min standing      Balance Overall balance assessment: Needs assistance Sitting-balance support: No upper extremity supported, Feet supported Sitting balance-Leahy Scale: Good     Standing balance support: Bilateral upper extremity supported Standing balance-Leahy Scale: Poor                             ADL either performed or assessed with clinical judgement   ADL Overall ADL's : Needs assistance/impaired                                       General ADL  Comments: MAX A for LBD seated EOB. SETUP + SUPERVISION seated grooming tasks. MIN A x2 for ADL t/f      Pertinent Vitals/Pain Pain Assessment Pain Assessment: 0-10 Pain Score: 4  Pain Location: L hip, R flank Pain Descriptors / Indicators: Aching, Grimacing, Guarding Pain Intervention(s): Limited activity within patient's tolerance, Repositioned     Hand Dominance Right    Extremity/Trunk Assessment Upper Extremity Assessment Upper Extremity Assessment: Overall WFL for tasks assessed   Lower Extremity Assessment Lower Extremity Assessment: Generalized weakness   Cervical / Trunk Assessment Cervical / Trunk Assessment: Kyphotic   Communication Communication Communication: No difficulties   Cognition Arousal/Alertness: Awake/alert Behavior During Therapy: WFL for tasks assessed/performed Overall Cognitive Status: Within Functional Limits for tasks assessed                                                  Home Living Family/patient expects to be discharged to:: Inpatient rehab Living Arrangements: Spouse/significant other Available Help at Discharge: Family;Available PRN/intermittently Type of Home: House Home Access: Stairs to enter [employer] of Steps: 5 Entrance Stairs-Rails: Left;Right Home Layout: One level               Home Equipment: Crutches;Rollator (4 wheels)          Prior Functioning/Environment Prior Level of Function : Independent/Modified Independent             Mobility Comments: Mod indep with rollator for household/community mobilization; bilat axillary crutches for stair negotiation.  Drives self to/from dialysis. Denies fall history outside of this event          OT Problem List: Decreased strength;Decreased range of motion;Decreased activity tolerance;Impaired balance (sitting and/or standing);Decreased safety awareness      OT Treatment/Interventions: Self-care/ADL training;Therapeutic exercise;Energy conservation;DME and/or AE instruction;Therapeutic activities;Balance training;Patient/family education    OT Goals(Current goals can be found in the care plan section) Acute Rehab OT Goals Patient Stated Goal: to return to PLOF OT Goal Formulation: With patient/family Time For Goal Achievement: 10/14/21 Potential to Achieve Goals: Good ADL Goals Pt Will Perform Grooming:  with min assist;standing Pt Will Perform Lower Body Dressing: sit to/from stand;with min assist Pt Will Transfer to Toilet: with min assist;ambulating;regular height toilet  OT Frequency: Min 3X/week    Co-evaluation              AM-PAC OT "6 Clicks" Daily Activity     Outcome Measure Help from another person eating meals?: None Help from another person taking care of personal grooming?: A Little Help from another person toileting, which includes using toliet, bedpan, or urinal?: A Lot Help from another person bathing (including washing, rinsing, drying)?: A Lot Help from another person to put on and taking off regular upper body clothing?: A Little Help from another person to put on and taking off regular lower body clothing?: A Lot 6 Click Score: 16   End of Session Equipment Utilized During Treatment: Gait belt;Oxygen  Activity Tolerance: Patient tolerated treatment well Patient left: in bed;with call bell/phone within reach;with family/visitor present  OT Visit Diagnosis: Unsteadiness on feet (R26.81);Other abnormalities of gait and mobility (R26.89);Muscle weakness (generalized) (M62.81)                Time: 0623-7628 OT Time Calculation (min): 31 min Charges:  OT General Charges $OT Visit: 1 Visit OT Evaluation $OT  Eval Moderate Complexity: 1 Mod OT Treatments $Self Care/Home Management : 23-37 mins  Dessie Coma, M.S. OTR/L  09/30/21, 4:33 PM  ascom [phone]

## 2021-09-30 NOTE — Progress Notes (Signed)
Inpatient Rehabilitation Admissions Coordinator   I spoke with patient's spouse by phone. I discussed goals and expectations of a possible CIR admit. He would like to discuss with the surgeon his recommendations for rehab venue and observe how she does this week with therapy at [facility]. He is in agreement for me to check benefits with her [facility] and Medicare coverage and to follow up with him tomorrow on her progress and further discussions. I feel she is a great candidate for admit at [facility] inpatient hospital acute rehab/CIR at [name].   Danne Baxter, RN, MSN Rehab Admissions Coordinator [phone] 09/30/2021 5:43 PM

## 2021-09-30 NOTE — Progress Notes (Signed)
Initial Nutrition Assessment  DOCUMENTATION CODES:  Not applicable  INTERVENTION:  Add Nepro Shake po BID, each supplement provides 425 kcal and 19 grams protein.  Add Rena-Vite daily.  Encourage PO and supplement intake.  NUTRITION DIAGNOSIS:  Increased nutrient needs related to chronic illness (ESRD on HD) as evidenced by estimated needs.  GOAL:  Patient will meet greater than or equal to 90% of their needs  MONITOR:  PO intake, Supplement acceptance, Labs, Weight trends, Skin, I & O's  REASON FOR ASSESSMENT:  Consult Hip fracture protocol  ASSESSMENT:  56 yo female with a PMH of ESRD on HD via right IJ, IDT2DM, diabetic neuropathy, secondary hyperparathyroidism of renal origin, HLD, and HTN who presents with R hip pain. Admitted with L femur fracture. 1/30 - L hip arthroplasty  Spoke with pt and husband at bedside. Both in very pleasant spirits. Pt reports that she has had decreased appetite with the pain in her hip. She reports that she follows a renal/carb modified diet at home usually.  She also reports her EDW is 81.5 kg. Her weight has been consistent per her report.  She reports that she had not had lunch yet. She gave RD her order and RD put her order into [employer] - LS potato soup, chocolate pudding, and unsweet tea with Splenda packet.  Medications: reviewed; colace BID, ferric citrate TID and with snacks, SSI, Mag-Ox, LR @ 75 ml/hr, Vicodin PO PRN (given twice today), Dilaudid per IV PRN (given twice today)  Labs: reviewed; Na 133 (L), K 5.6 (H), CBG 153-187 (H), BUN 57 (H - trending down), Crt 7.30 (H - trending down), Phos 6.3 (H) HbA1c: 6.2% (09/29/2021)  NUTRITION - FOCUSED PHYSICAL EXAM: Flowsheet Row Most Recent Value  Orbital Region No depletion  Upper Arm Region No depletion  Thoracic and Lumbar Region No depletion  Buccal Region No depletion  Temple Region No depletion  Clavicle Bone Region No depletion  Clavicle and Acromion Bone Region No  depletion  Scapular Bone Region No depletion  Dorsal Hand No depletion  Patellar Region No depletion  Anterior Thigh Region No depletion  Posterior Calf Region No depletion  Edema (RD Assessment) None  Hair Reviewed  Eyes Reviewed  Mouth Reviewed  Skin Reviewed  Nails Reviewed   Diet Order:   Diet Order             Diet Carb Modified Fluid consistency: Thin; Room service appropriate? Yes  Diet effective now                  EDUCATION NEEDS:  Education needs have been addressed  Skin:  Skin Assessment: Skin Integrity Issues: Skin Integrity Issues:: Incisions Incisions: L hip, closed (1/30)  Last BM:  09/28/21  Height:  Ht Readings from Last 1 Encounters:  09/30/21 5\' 7"  (1.702 m)   Weight:  Wt Readings from Last 1 Encounters:  09/29/21 85.7 kg   BMI:  Body mass index is 29.59 kg/m.  Estimated Nutritional Needs:  Kcal:  1900-2100 Protein:  105-120 grams Fluid:  >1.9 L  Derrel Nip, RD, LDN (she/her/hers) Clinical Inpatient Dietitian RD Pager/After-Hours/Weekend Pager # in [place]

## 2021-09-30 NOTE — Progress Notes (Signed)
Inpatient Rehab Admissions Coordinator:   Per PT recommendation, patient was screened for CIR candidacy by Clemens Catholic, MS, CCC-SLP . At this time, Pt. is not yet tolerating OOB and has not yet demonstrating ability to tolerate the intensity of CIR; however,   Pt. may have potential to progress to becoming a potential CIR candidate, so CIR admissions team will follow and monitor for progress and participation with therapies and place consult order if Pt. appears to be an appropriate candidate. Please contact me with any questions.   Clemens Catholic, [place], [facility] Admissions Coordinator  [phone] ([place]) [phone] (office)

## 2021-09-30 NOTE — Evaluation (Addendum)
Physical Therapy Evaluation Patient Details Name: [name] MRN: [id number] DOB: Dec 07, 1965 Today's Date: 09/30/2021  History of Present Illness  presented to ER secondary to L hip pain; noted with R inferior pubic ramus frature (WBAT), L femoral neck fracture s/p L hemiarthroplast (WBAT), 09/29/21.  Clinical Impression  Patient resting in bed upon arrival to room; alert and oriented, eager for initiation of therapy services.  Rates pain in L hip grossly 5-6/10; meds received just prior to session.  Globally weak throughout bilat LEs with isolated strength (2+ to 3-/5) and ROM assessment (approx 50% hip ROM), requiring act assist from therapist for movement throughout all planes of bilat hips.  Currently requiring mod/max assist for bed mobility; min progressing to close sup for unsupported sitting balance.  Significant time and min/mod physical assist required for scooting to edge of bed and repositioning to midline.  Limited tolerance for movement throughout immediate BOS due to pain in bilat hips, R flank.  Does report dizziness with transition to upright that increases with sustained sitting (over 10 minutes) despite functional activity; ultimately requiring return to supine for resolution.  Noted downtrending BP once upright; will continue to monitor orthostatics throughout.  Additional OOB and standing deferred as result; will plan for OOB assessment next session pending adequate BP response to activity.  Do anticipate need for +2 given noted weakness to bilat LEs. Would benefit from skilled PT to address above deficits and promote optimal return to PLOF.; recommend transition to acute inpatient rehab upon discharge for high-intensity, post-acute rehab services.  Vitals: initial sitting BP 115/58, HR 98; sitting x5 minutes BP 110/61, HR 105; sitting x10 minutes BP 98/55, HR 98.  Symptomatic with drop in BP  Additionally, attempted wean to RA during session with desat to 84-85% noted; required  reapplication of O2 via [place] (1L) to maintain sats 90% with exertion.  May consider increase to 2L with more exertional activity in subsequent sessions.  May benefit from incentive spirometer; RN/MD informed/aware.       Recommendations for follow up therapy are one component of a multi-disciplinary discharge planning process, led by the attending physician.  Recommendations may be updated based on patient status, additional functional criteria and insurance authorization.  Follow Up Recommendations Acute inpatient rehab (3hours/day)    Assistance Recommended at Discharge Frequent or constant Supervision/Assistance  Patient can return home with the following  Two people to help with walking and/or transfers;Two people to help with bathing/dressing/bathroom;Help with stairs or ramp for entrance    Equipment Recommendations Rolling walker (2 wheels);BSC/3in1  Recommendations for Other Services  OT consult    Functional Status Assessment Patient has had a recent decline in their functional status and demonstrates the ability to make significant improvements in function in a reasonable and predictable amount of time.     Precautions / Restrictions Precautions Precautions: Fall Precaution Comments: L AVF, R IJ perm-cath Restrictions Weight Bearing Restrictions: Yes RLE Weight Bearing: Weight bearing as tolerated LLE Weight Bearing: Weight bearing as tolerated      Mobility  Bed Mobility Overal bed mobility: Needs Assistance Bed Mobility: Supine to Sit, Sit to Supine     Supine to sit: Mod assist Sit to supine: Mod assist, Max assist   General bed mobility comments: extensive assist for LE management and scooting to edge of bed    Transfers                   General transfer comment: deferred due to symptomatic orthostasis  Ambulation/Gait               General Gait Details: deferred due to symptomatic orthostasis  Stairs            Wheelchair  Mobility    Modified Rankin (Stroke Patients Only)       Balance Overall balance assessment: Needs assistance Sitting-balance support: No upper extremity supported, Feet supported Sitting balance-Leahy Scale: Good         Standing balance comment: deferred due to symptomatic orthostasis                             Pertinent Vitals/Pain Pain Assessment Pain Assessment: Faces Faces Pain Scale: Hurts even more Pain Location: L hip, R flank Pain Descriptors / Indicators: Aching, Grimacing, Guarding Pain Intervention(s): Limited activity within patient's tolerance, Monitored during session, Premedicated before session, Repositioned    Home Living Family/patient expects to be discharged to:: Inpatient rehab Living Arrangements: Spouse/significant other Available Help at Discharge: Family;Available PRN/intermittently Type of Home: House Home Access: Stairs to enter Entrance Stairs-Rails: Chemical engineer of Steps: 5   Home Layout: One level Home Equipment: Crutches;Rollator (4 wheels)      Prior Function Prior Level of Function : Independent/Modified Independent             Mobility Comments: Mod indep with rollator for household/community mobilization; bilat axillary crutches for stair negotiation.  Drives self to/from dialysis. Denies fall history outside of this event       Hand Dominance   Dominant Hand: Right    Extremity/Trunk Assessment   Upper Extremity Assessment Upper Extremity Assessment: Overall WFL for tasks assessed    Lower Extremity Assessment Lower Extremity Assessment: Generalized weakness (bilat hips grossly 2+ to 3-/5, knees and ankles 3/5; baseline neuropathy plantar surfaces of bilat feet)    Cervical / Trunk Assessment Cervical / Trunk Assessment: Kyphotic  Communication   Communication: No difficulties  Cognition Arousal/Alertness: Awake/alert Behavior During Therapy: WFL for tasks  assessed/performed Overall Cognitive Status: Within Functional Limits for tasks assessed                                          General Comments      Exercises Other Exercises Other Exercises: Reviewed role of PT and progressive mobility; WBing restrictions.  Patient voiced understanding. Other Exercises: Supine LE therex, 1x10, act assist ROM: ankle pumps, quad sets, SAQs, heel slides; seated LE therex, 1x10, active ROM: ankle pumps, LAQs Other Exercises: Unsupported sitting, worked on postural extension, weight shift and closed-chain WBing/loading to bilat LEs; significant functional weakness to bilat hips, requiring act assist for movement with all functional activities.  Further progression limited by symptomatic orthostasis Other Exercises: Transitioned to chair position in bed for repositioning, improved tolerance to upright; patient comfortable, needs in reach.  Husband present at bedside end of session.   Assessment/Plan    PT Assessment Patient needs continued PT services  PT Problem List Decreased strength;Decreased range of motion;Decreased balance;Decreased knowledge of use of DME;Decreased coordination;Decreased activity tolerance;Decreased mobility;Decreased safety awareness;Decreased knowledge of precautions;Cardiopulmonary status limiting activity;Decreased skin integrity;Pain       PT Treatment Interventions DME instruction;Gait training;Stair training;Functional mobility training;Therapeutic activities;Therapeutic exercise;Balance training;Patient/family education    PT Goals (Current goals can be found in the Care Plan section)  Acute Rehab PT Goals Patient Stated Goal: to return  home PT Goal Formulation: With patient/family Time For Goal Achievement: 10/14/21 Potential to Achieve Goals: Good    Frequency 7X/week     Co-evaluation               AM-PAC PT "6 Clicks" Mobility  Outcome Measure Help needed turning from your back to your  side while in a flat bed without using bedrails?: A Lot Help needed moving from lying on your back to sitting on the side of a flat bed without using bedrails?: A Lot Help needed moving to and from a bed to a chair (including a wheelchair)?: A Lot Help needed standing up from a chair using your arms (e.g., wheelchair or bedside chair)?: A Lot Help needed to walk in hospital room?: Total Help needed climbing 3-5 steps with a railing? : Total 6 Click Score: 10    End of Session Equipment Utilized During Treatment: Gait belt Activity Tolerance: Patient tolerated treatment well Patient left: in bed;with call bell/phone within reach;with bed alarm set Nurse Communication: Mobility status PT Visit Diagnosis: Muscle weakness (generalized) (M62.81);Other abnormalities of gait and mobility (R26.89);Pain Pain - Right/Left: Left Pain - part of body: Hip    Time: 0160-1093 PT Time Calculation (min) (ACUTE ONLY): 47 min   Charges:   PT Evaluation $PT Eval Moderate Complexity: 1 Mod PT Treatments $Therapeutic Exercise: 8-22 mins $Therapeutic Activity: 8-22 mins        Rakiya Krawczyk H. Owens Shark, PT, DPT, NCS 09/30/21, 3:15 PM [phone]

## 2021-09-30 NOTE — Progress Notes (Signed)
°  Transition of Care [facility]) Screening Note   Patient Details  Name: [name] Date of Birth: [birth year]   Transition of Care [facility]) CM/SW Contact:    Alberteen Sam, LCSW Phone Number:[phone] 09/30/2021, 8:49 AM    Transition of Care Department [facility]) has reviewed patient and no TOC needs have been identified at this time. We will continue to monitor patient advancement through interdisciplinary progression rounds. If new patient transition needs arise, please place a TOC consult.  [name], [place]

## 2021-09-30 NOTE — Progress Notes (Signed)
Subjective:  Patient reports pain as moderate.  Having muscle spasms. Husband is in the room.  Objective:   VITALS:   Vitals:   09/30/21 0003 09/30/21 0509 09/30/21 0746 09/30/21 1225  BP: (!) 102/59 (!) 123/57 (!) 116/55 (!) 117/53  Pulse: 95 91 92 100  Resp: 17 18 18 20   Temp: 98 F (36.7 C) 98 F (36.7 C) 99.1 F (37.3 C) 97.6 F (36.4 C)  TempSrc: Oral   Oral  SpO2: 97% 99% 98% 96%  Weight:      Height: 5\' 7"  (1.702 m)       PHYSICAL EXAM:  Sensation intact distally Dorsiflexion/Plantar flexion intact Incision: dressing C/D/I Compartment soft  LABS  Results for orders placed or performed during the hospital encounter of 09/28/21 (from the past 24 hour(s))  CBG monitoring, ED     Status: Abnormal   Collection Time: 09/29/21  5:09 PM  Result Value Ref Range   Glucose-Capillary 170 (H) 70 - 99 mg/dL  Glucose, capillary     Status: Abnormal   Collection Time: 09/30/21 12:06 AM  Result Value Ref Range   Glucose-Capillary 170 (H) 70 - 99 mg/dL  CBC     Status: Abnormal   Collection Time: 09/30/21  6:45 AM  Result Value Ref Range   WBC 9.1 4.0 - 10.5 K/uL   RBC 2.36 (L) 3.87 - 5.11 MIL/uL   Hemoglobin 7.7 (L) 12.0 - 15.0 g/dL   HCT 23.7 (L) 36.0 - 46.0 %   MCV 100.4 (H) 80.0 - 100.0 fL   MCH 32.6 26.0 - 34.0 pg   MCHC 32.5 30.0 - 36.0 g/dL   RDW 13.3 11.5 - 15.5 %   Platelets 189 150 - 400 K/uL   nRBC 0.0 0.0 - 0.2 %  Basic metabolic panel     Status: Abnormal   Collection Time: 09/30/21  6:45 AM  Result Value Ref Range   Sodium 133 (L) 135 - 145 mmol/L   Potassium 5.6 (H) 3.5 - 5.1 mmol/L   Chloride 94 (L) 98 - 111 mmol/L   CO2 25 22 - 32 mmol/L   Glucose, Bld 162 (H) 70 - 99 mg/dL   BUN 57 (H) 6 - 20 mg/dL   Creatinine, Ser 7.30 (H) 0.44 - 1.00 mg/dL   Calcium 8.4 (L) 8.9 - 10.3 mg/dL   GFR, Estimated 6 (L) >60 mL/min   Anion gap 14 5 - 15  Glucose, capillary     Status: Abnormal   Collection Time: 09/30/21  7:50 AM  Result Value Ref Range    Glucose-Capillary 168 (H) 70 - 99 mg/dL  Glucose, capillary     Status: Abnormal   Collection Time: 09/30/21 12:25 PM  Result Value Ref Range   Glucose-Capillary 187 (H) 70 - 99 mg/dL    CT Hip Left Wo Contrast  Result Date: 09/28/2021 CLINICAL DATA:  Fall, left hip fracture EXAM: CT OF THE LEFT HIP WITHOUT CONTRAST TECHNIQUE: Multidetector CT imaging of the left hip was performed according to the standard protocol. Multiplanar CT image reconstructions were also generated. RADIATION DOSE REDUCTION: This exam was performed according to the departmental dose-optimization program which includes automated exposure control, adjustment of the mA and/or kV according to patient size and/or use of iterative reconstruction technique. COMPARISON:  X-ray 09/28/2021, CT 06/09/2019 FINDINGS: Bones/Joint/Cartilage Marked diffuse osteopenia. Acute transcervical fracture through the left femoral neck with significant varus angulation. No definite intertrochanteric involvement. Hip joint alignment is maintained without dislocation. Acute nondisplaced fracture of  the right inferior pubic ramus (series 2, image 91). No additional fractures are identified. Moderate arthropathy of the pubic symphysis with new erosive or resorptive changes of the bilateral pubic bones. Symphysis is mildly widened compared to previous CT. Ligaments Suboptimally assessed by CT. Muscles and Tendons No acute musculotendinous abnormality by CT. Soft tissues Mild soft tissue swelling at the femoral neck fracture site. No organized hematoma. No acute findings within the visualized portion of the left hemipelvis. IMPRESSION: 1. Acute transcervical fracture through the LEFT femoral neck with varus angulation. 2. Acute nondisplaced fracture of the RIGHT inferior pubic ramus. 3. Moderate arthropathy of the pubic symphysis with new resorptive or erosive changes of the bilateral pubic bones. This may be secondary to underlying renal osteodystrophy. Septic  arthritis could have this appearance in the appropriate clinical setting. Mild widening at the pubic symphysis is favored secondary to underlying arthropathy/resorption rather than posttraumatic diastasis. 4. Marked bony demineralization. Electronically Signed   By: Davina Poke D.O.   On: 09/28/2021 18:56   DG Chest Port 1 View  Result Date: 09/28/2021 CLINICAL DATA:  Fall EXAM: PORTABLE CHEST 1 VIEW COMPARISON:  None. FINDINGS: The cardiomediastinal silhouette is enlarged in contour.RIGHT chest CVC tip terminating over the superior cavoatrial junction. Enlarged appearance of bilateral hilar contours most consistent with enlarged pulmonary arteries and likely underlying pulmonary arterial hypertension. Elevation of the RIGHT hemidiaphragm. No pleural effusion. No pneumothorax. No acute pleuroparenchymal abnormality. Visualized abdomen is unremarkable. Osteopenia. IMPRESSION: 1. Cardiomegaly with enlarged hilar contours likely reflecting enlarged pulmonary arteries and underlying pulmonary arterial hypertension. Electronically Signed   By: Valentino Saxon M.D.   On: 09/28/2021 17:05   DG C-Arm 1-60 Min-No Report  Result Date: 09/29/2021 Fluoroscopy was utilized by the requesting physician.  No radiographic interpretation.   DG HIP UNILAT WITH PELVIS 1V LEFT  Result Date: 09/29/2021 CLINICAL DATA:  Left hip arthroplasty, fracture left femur EXAM: DG HIP (WITH OR WITHOUT PELVIS) 1V*L* COMPARISON:  09/28/2021 FINDINGS: Fluoroscopic images show interval left hip arthroplasty. Fluoroscopic time was 6 seconds. Radiation dose is 0.67 mGy. IMPRESSION: Fluoroscopic assistance was provided for left hip arthroplasty. Electronically Signed   By: Elmer Picker M.D.   On: 09/29/2021 17:02   DG Hip Unilat W or Wo Pelvis 2-3 Views Left  Result Date: 09/28/2021 CLINICAL DATA:  Fall EXAM: DG HIP (WITH OR WITHOUT PELVIS) 2-3V LEFT COMPARISON:  None. FINDINGS: Osteopenia. There is a displaced fracture  through the neck and intratrochanteric LEFT femur with superior translocation of the distal femur. Femoral head appears to be seated within the acetabulum although evaluation is limited by body habitus. No additional fracture noted. Limited assessment of the sacrum secondary to overlapping bowel contents and profound osteopenia. IMPRESSION: Displaced foreshortened fracture of the neck and inter trochanteric LEFT femur. Electronically Signed   By: Valentino Saxon M.D.   On: 09/28/2021 17:04    Assessment/Plan: 1 Day Post-Op   Principal Problem:   Femur fracture, left (HCC) Active Problems:   Dyslipidemia   Secondary hyperparathyroidism of renal origin ([place])   Diabetic neuropathy ([place])   End stage renal disease on dialysis ([place])   Type 1 diabetes mellitus with chronic kidney disease on chronic dialysis [facility])   Essential hypertension   Up with therapy Discharge to SNF likely Dressing change tomorrow WBAT on the left hip and the right side (rami fracture) RTC in 2 weeks for staple removal   Lovell Sheehan , MD 09/30/2021, 12:58 PM

## 2021-09-30 NOTE — Progress Notes (Signed)
[facility]  ROUNDING NOTE   Subjective:  Patient very well-known to [place].  Patient seen resting in bed comfortably, reports left hip pain with movement Tolerating meals without nausea Denies shortness of breath No edema   Objective:  Vital signs in last 24 hours:  Temp:  [97.6 F (36.4 C)-99.1 F (37.3 C)] 97.6 F (36.4 C) (01/31 1225) Pulse Rate:  [91-106] 100 (01/31 1225) Resp:  [11-24] 20 (01/31 1225) BP: (71-178)/(43-86) 117/53 (01/31 1225) SpO2:  [88 %-100 %] 96 % (01/31 1225) Weight:  [85.7 kg] 85.7 kg (01/30 1845)  Weight change: -1.3 kg Filed Weights   09/28/21 1554 09/29/21 1845  Weight: 87 kg 85.7 kg    Intake/Output: I/O last 3 completed shifts: In: -  Out: 1329 [Other:1029; Blood:300]   Intake/Output this shift:  No intake/output data recorded.  Physical Exam: General: NAD  Head: Normocephalic, atraumatic. Moist oral mucosal membranes  Eyes: Anicteric  Lungs:  Clear to auscultation, normal effort  Heart: S1S2 no rubs  Abdomen:  Soft, nontender, bowel sounds present  Extremities: No peripheral edema.  Neurologic: Awake, alert, following commands  Skin: No acute rash, left hip surgical dressing  Access: IJ PermCath    Basic Metabolic Panel: Recent Labs  Lab 09/28/21 1556 09/29/21 0630 09/30/21 0645  NA 137 135 133*  K 4.5 5.3* 5.6*  CL 94* 93* 94*  CO2 27 25 25   GLUCOSE 121* 130* 162*  BUN 59* 69* 57*  CREATININE 7.75* 8.78* 7.30*  CALCIUM 10.0 9.4 8.4*  MG  --  2.4  --   PHOS  --  6.3*  --      Liver Function Tests: No results for input(s): AST, ALT, ALKPHOS, BILITOT, PROT, ALBUMIN in the last 168 hours. No results for input(s): LIPASE, AMYLASE in the last 168 hours. No results for input(s): AMMONIA in the last 168 hours.  CBC: Recent Labs  Lab 09/28/21 1556 09/29/21 0630 09/30/21 0645  WBC 4.6 5.4 9.1  NEUTROABS 3.4  --   --   HGB 10.0* 9.9* 7.7*  HCT 31.3* 30.2* 23.7*  MCV 101.3* 97.4 100.4*  PLT 168 158  189     Cardiac Enzymes: No results for input(s): CKTOTAL, CKMB, CKMBINDEX, TROPONINI in the last 168 hours.  BNP: Invalid input(s): POCBNP  CBG: Recent Labs  Lab 09/29/21 1210 09/29/21 1709 09/30/21 0006 09/30/21 0750 09/30/21 1225  GLUCAP 153* 170* 170* 168* 187*     Microbiology: Results for orders placed or performed during the hospital encounter of 09/28/21  Resp Panel by RT-PCR (Flu A&B, Covid) Nasopharyngeal Swab     Status: None   Collection Time: 09/28/21  3:50 PM   Specimen: Nasopharyngeal Swab; Nasopharyngeal(NP) swabs in vial transport medium  Result Value Ref Range Status   SARS Coronavirus 2 by RT PCR NEGATIVE NEGATIVE Final    Comment: (NOTE) SARS-CoV-2 target nucleic acids are NOT DETECTED.  The SARS-CoV-2 RNA is generally detectable in upper respiratory specimens during the acute phase of infection. The lowest concentration of SARS-CoV-2 viral copies this assay can detect is 138 copies/mL. A negative result does not preclude SARS-Cov-2 infection and should not be used as the sole basis for treatment or other patient management decisions. A negative result may occur with  improper specimen collection/handling, submission of specimen other than nasopharyngeal swab, presence of viral mutation(s) within the areas targeted by this assay, and inadequate number of viral copies(<138 copies/mL). A negative result must be combined with clinical observations, patient history, and epidemiological  information. The expected result is Negative.  Fact Sheet for Patients:  EntrepreneurPulse.com.au  Fact Sheet for Healthcare Providers:  IncredibleEmployment.be  This test is no t yet approved or cleared by the [place] FDA and  has been authorized for detection and/or diagnosis of SARS-CoV-2 by FDA under an Emergency Use Authorization (EUA). This EUA will remain  in effect (meaning this test can be used) for the duration of  the COVID-19 declaration under Section 564(b)(1) of the Act, 21 U.S.C.section 360bbb-3(b)(1), unless the authorization is terminated  or revoked sooner.       Influenza A by PCR NEGATIVE NEGATIVE Final   Influenza B by PCR NEGATIVE NEGATIVE Final    Comment: (NOTE) The Xpert Xpress SARS-CoV-2/FLU/RSV plus assay is intended as an aid in the diagnosis of influenza from Nasopharyngeal swab specimens and should not be used as a sole basis for treatment. Nasal washings and aspirates are unacceptable for Xpert Xpress SARS-CoV-2/FLU/RSV testing.  Fact Sheet for Patients: EntrepreneurPulse.com.au  Fact Sheet for Healthcare Providers: IncredibleEmployment.be  This test is not yet approved or cleared by the [place] FDA and has been authorized for detection and/or diagnosis of SARS-CoV-2 by FDA under an Emergency Use Authorization (EUA). This EUA will remain in effect (meaning this test can be used) for the duration of the COVID-19 declaration under Section 564(b)(1) of the Act, 21 U.S.C. section 360bbb-3(b)(1), unless the authorization is terminated or revoked.  Performed at [facility], [address], [place], [place] [address]   Surgical PCR screen     Status: None   Collection Time: 09/29/21  8:13 AM   Specimen: Nasal Mucosa; Nasal Swab  Result Value Ref Range Status   MRSA, PCR NEGATIVE NEGATIVE Final   Staphylococcus aureus NEGATIVE NEGATIVE Final    Comment: (NOTE) The Xpert SA Assay (FDA approved for NASAL specimens in patients 63 years of age and older), is one component of a comprehensive surveillance program. It is not intended to diagnose infection nor to guide or monitor treatment. Performed at [facility], [address] [place],  [address]     Coagulation Studies: No results for input(s): LABPROT, INR in the last 72 hours.  Urinalysis: No results for input(s): COLORURINE, LABSPEC, PHURINE,  GLUCOSEU, HGBUR, BILIRUBINUR, KETONESUR, PROTEINUR, UROBILINOGEN, NITRITE, LEUKOCYTESUR in the last 72 hours.  Invalid input(s): APPERANCEUR    Imaging: CT Hip Left Wo Contrast  Result Date: 09/28/2021 CLINICAL DATA:  Fall, left hip fracture EXAM: CT OF THE LEFT HIP WITHOUT CONTRAST TECHNIQUE: Multidetector CT imaging of the left hip was performed according to the standard protocol. Multiplanar CT image reconstructions were also generated. RADIATION DOSE REDUCTION: This exam was performed according to the departmental dose-optimization program which includes automated exposure control, adjustment of the mA and/or kV according to patient size and/or use of iterative reconstruction technique. COMPARISON:  X-ray 09/28/2021, CT 06/09/2019 FINDINGS: Bones/Joint/Cartilage Marked diffuse osteopenia. Acute transcervical fracture through the left femoral neck with significant varus angulation. No definite intertrochanteric involvement. Hip joint alignment is maintained without dislocation. Acute nondisplaced fracture of the right inferior pubic ramus (series 2, image 91). No additional fractures are identified. Moderate arthropathy of the pubic symphysis with new erosive or resorptive changes of the bilateral pubic bones. Symphysis is mildly widened compared to previous CT. Ligaments Suboptimally assessed by CT. Muscles and Tendons No acute musculotendinous abnormality by CT. Soft tissues Mild soft tissue swelling at the femoral neck fracture site. No organized hematoma. No acute findings within the visualized portion of  the left hemipelvis. IMPRESSION: 1. Acute transcervical fracture through the LEFT femoral neck with varus angulation. 2. Acute nondisplaced fracture of the RIGHT inferior pubic ramus. 3. Moderate arthropathy of the pubic symphysis with new resorptive or erosive changes of the bilateral pubic bones. This may be secondary to underlying renal osteodystrophy. Septic arthritis could have this appearance  in the appropriate clinical setting. Mild widening at the pubic symphysis is favored secondary to underlying arthropathy/resorption rather than posttraumatic diastasis. 4. Marked bony demineralization. Electronically Signed   By: Davina Poke D.O.   On: 09/28/2021 18:56   DG Chest Port 1 View  Result Date: 09/28/2021 CLINICAL DATA:  Fall EXAM: PORTABLE CHEST 1 VIEW COMPARISON:  None. FINDINGS: The cardiomediastinal silhouette is enlarged in contour.RIGHT chest CVC tip terminating over the superior cavoatrial junction. Enlarged appearance of bilateral hilar contours most consistent with enlarged pulmonary arteries and likely underlying pulmonary arterial hypertension. Elevation of the RIGHT hemidiaphragm. No pleural effusion. No pneumothorax. No acute pleuroparenchymal abnormality. Visualized abdomen is unremarkable. Osteopenia. IMPRESSION: 1. Cardiomegaly with enlarged hilar contours likely reflecting enlarged pulmonary arteries and underlying pulmonary arterial hypertension. Electronically Signed   By: Valentino Saxon M.D.   On: 09/28/2021 17:05   DG C-Arm 1-60 Min-No Report  Result Date: 09/29/2021 Fluoroscopy was utilized by the requesting physician.  No radiographic interpretation.   DG HIP UNILAT WITH PELVIS 1V LEFT  Result Date: 09/29/2021 CLINICAL DATA:  Left hip arthroplasty, fracture left femur EXAM: DG HIP (WITH OR WITHOUT PELVIS) 1V*L* COMPARISON:  09/28/2021 FINDINGS: Fluoroscopic images show interval left hip arthroplasty. Fluoroscopic time was 6 seconds. Radiation dose is 0.67 mGy. IMPRESSION: Fluoroscopic assistance was provided for left hip arthroplasty. Electronically Signed   By: Elmer Picker M.D.   On: 09/29/2021 17:02   DG Hip Unilat W or Wo Pelvis 2-3 Views Left  Result Date: 09/28/2021 CLINICAL DATA:  Fall EXAM: DG HIP (WITH OR WITHOUT PELVIS) 2-3V LEFT COMPARISON:  None. FINDINGS: Osteopenia. There is a displaced fracture through the neck and intratrochanteric  LEFT femur with superior translocation of the distal femur. Femoral head appears to be seated within the acetabulum although evaluation is limited by body habitus. No additional fracture noted. Limited assessment of the sacrum secondary to overlapping bowel contents and profound osteopenia. IMPRESSION: Displaced foreshortened fracture of the neck and inter trochanteric LEFT femur. Electronically Signed   By: Valentino Saxon M.D.   On: 09/28/2021 17:04     Medications:    lactated ringers 10 mL/hr at 09/30/21 0600    acetaminophen  650 mg Oral Once   Chlorhexidine Gluconate Cloth  6 each Topical Daily   dextrose  1 ampule Intravenous Once   dextrose  1 ampule Intravenous Once   docusate sodium  100 mg Oral BID   feeding supplement (NEPRO CARB STEADY)  237 mL Oral BID BM   ferric citrate  420 mg Oral TID WC   And   ferric citrate  210 mg Oral With snacks   insulin aspart  0-5 Units Subcutaneous QHS   insulin aspart  0-6 Units Subcutaneous TID WC   insulin aspart  10 Units Intravenous Once   [START ON 10/02/2021] losartan  50 mg Oral Daily   magnesium oxide  200 mg Oral BID   methocarbamol  750 mg Oral QID   multivitamin  1 tablet Oral QHS   alum & mag hydroxide-simeth, bisacodyl, hydrALAZINE, HYDROcodone-acetaminophen, HYDROmorphone (DILAUDID) injection, labetalol, magnesium hydroxide, melatonin, menthol-cetylpyridinium **OR** phenol, metoCLOPramide **OR** metoCLOPramide (REGLAN) injection, ondansetron **OR**  ondansetron (ZOFRAN) IV  Assessment/ Plan:  56 y.o. female with past medical history of ESRD on HD undergoing training for HHD, anemia of chronic kidney disease, severe secondary hyperparathyroidism, hypertension, diabetes mellitus type 1, peripheral neuropathy, hyperlipidemia who presents now with left hip fracture.  [facility]/MWF/right PermCath  1.  ESRD currently undergoing training for HHD.  She was undergoing treatment training for home hemodialysis.   Plan to  dialyze patient today with UF goal 1.5 L as tolerated.  Next treatment scheduled for Wednesday  2.  Anemia of chronic kidney disease.  Hemoglobin 7.7.  We will continue to monitor postop hemoglobin and determine need for ESA's.  3.  Secondary hyperparathyroidism.  Has severe underlying secondary hyperparathyroidism and has agreed to parathyroidectomy evaluation.  We will work to coordinate this outpatient  4.  Hypokalemia.  Serum potassium 5.6.  Will correct with dialysis   LOS: 2 [place] 1/31/20232:10 PM

## 2021-09-30 NOTE — Progress Notes (Signed)
Inpatient Rehabilitation Admissions Coordinator   I will place rehab consult and a [facility] CIR admissions coordinator will follow up for full assessment.  Danne Baxter, RN, MSN Rehab Admissions Coordinator [phone] 09/30/2021 5:25 PM

## 2021-10-01 DIAGNOSIS — S72102A Unspecified trochanteric fracture of left femur, initial encounter for closed fracture: Secondary | ICD-10-CM | POA: Diagnosis not present

## 2021-10-01 LAB — GLUCOSE, CAPILLARY
Glucose-Capillary: 173 mg/dL — ABNORMAL HIGH (ref 70–99)
Glucose-Capillary: 188 mg/dL — ABNORMAL HIGH (ref 70–99)
Glucose-Capillary: 283 mg/dL — ABNORMAL HIGH (ref 70–99)
Glucose-Capillary: 177 mg/dL — ABNORMAL HIGH (ref 70–99)

## 2021-10-01 LAB — PREPARE RBC (CROSSMATCH)

## 2021-10-01 LAB — CBC
HCT: 20.6 % — ABNORMAL LOW (ref 36.0–46.0)
Hemoglobin: 6.7 g/dL — ABNORMAL LOW (ref 12.0–15.0)
MCH: 32.5 pg (ref 26.0–34.0)
MCHC: 32.5 g/dL (ref 30.0–36.0)
MCV: 100 fL (ref 80.0–100.0)
Platelets: 167 10*3/uL (ref 150–400)
RBC: 2.06 MIL/uL — ABNORMAL LOW (ref 3.87–5.11)
RDW: 13.7 % (ref 11.5–15.5)
WBC: 8.7 10*3/uL (ref 4.0–10.5)
nRBC: 0 % (ref 0.0–0.2)

## 2021-10-01 LAB — SURGICAL PATHOLOGY

## 2021-10-01 MED ORDER — DIPHENHYDRAMINE HCL 25 MG PO CAPS
25.0000 mg | ORAL_CAPSULE | Freq: Once | ORAL | Status: DC
  Filled 2021-10-01: qty 1

## 2021-10-01 MED ORDER — FUROSEMIDE 10 MG/ML IJ SOLN
20.0000 mg | Freq: Once | INTRAMUSCULAR | Status: DC
  Filled 2021-10-01: qty 4

## 2021-10-01 MED ORDER — SODIUM CHLORIDE 0.9% IV SOLUTION: Freq: Once | INTRAVENOUS | Status: DC

## 2021-10-01 MED ORDER — HYDROCODONE-ACETAMINOPHEN 5-325 MG PO TABS: 1.0000 | ORAL_TABLET | ORAL | 0 refills | Status: DC | PRN

## 2021-10-01 MED ORDER — SODIUM CHLORIDE 0.9 % IV SOLN
100.0000 mg | Freq: Once | INTRAVENOUS | Status: AC
  Administered 2021-10-01: 100 mg via INTRAVENOUS
  Filled 2021-10-01: qty 5

## 2021-10-01 MED ORDER — HEPARIN SODIUM (PORCINE) 1000 UNIT/ML IJ SOLN
INTRAMUSCULAR | Status: AC
  Filled 2021-10-01: qty 10

## 2021-10-01 NOTE — Progress Notes (Signed)
Patient with scheduled 3-hour treatment, has RIJ catheter, flushes well, with blood return. Unable to complete treatment despite the initial assessment of catheter, arterial pressures exceedingly high, >280 with BFR of 100 -125. After discussion with Nephrology team the decision was made to end treatment and have patient evaluated by the vascular team. Patient's blood was return before clotting, patient returned to room with an understanding of the plan.

## 2021-10-01 NOTE — Progress Notes (Signed)
Physical Therapy Treatment Patient Details Name: [name] MRN: [id number] DOB: 06/29/1966 Today's Date: 10/01/2021   History of Present Illness presented to ER secondary to L hip pain; noted with R inferior pubic ramus frature (WBAT), L femoral neck fracture s/p L hemiarthroplast (WBAT), 09/29/21.    PT Comments    Pt was sitting in recliner since earlier OT session. She is A and O x 4 but does endorse fear of standing to return to bed. C/O L groin/ thigh pain however pain did not limit session progression. Pt is overall limited by this fear. She was unable to stand (2 attempts) with +2 assistance. She was assisted back to bed from recliner via hoyer lift. Required assistance to reposition in bed. Author discussed/educated pt at length about fear of falling and importance of continuing to perform there ex while in bed. She demonstrated ability to perform I'ly with cues only. Supportive spouse and best friend present throughout session. She is a great inpatient rehab candidate with 24/7 assistance at DC. She would benefit from the more aggressive PT/OT that at SNF would be unable to provide. Highly recommend DC to CIR when medically cleared. Author will return tomorrow to attempt standing from EOB in which bed height can be adjusted to achieve full/safe standing.  Author recommends RN staff have +2 assistance and use mechanical lift for any OOB activity.    Recommendations for follow up therapy are one component of a multi-disciplinary discharge planning process, led by the attending physician.  Recommendations may be updated based on patient status, additional functional criteria and insurance authorization.  Follow Up Recommendations  Acute inpatient rehab (3hours/day)     Assistance Recommended at Discharge Frequent or constant Supervision/Assistance  Patient can return home with the following Two people to help with walking and/or transfers;Two people to help with  bathing/dressing/bathroom;Help with stairs or ramp for entrance   Equipment Recommendations  Rolling walker (2 wheels);BSC/3in1       Precautions / Restrictions Precautions Precautions: Fall Precaution Comments: L AVF, R IJ perm-cath ( will need new dialysis cath) Restrictions Weight Bearing Restrictions: Yes RLE Weight Bearing: Weight bearing as tolerated LLE Weight Bearing: Weight bearing as tolerated     Mobility  Bed Mobility Overal bed mobility: Needs Assistance    General bed mobility comments: after pt was returned to bed, required max assist to reposition to [facility]    Transfers Overall transfer level: Needs assistance Equipment used: Rolling walker (2 wheels) Transfers: Bed to chair/wheelchair/BSC Sit to Stand: Total assist, +2 physical assistance, +2 safety/equipment           General transfer comment: Pt attempted standing several times from recliner to RW however was unsafe even with +2 assistance. pt required hoyer transfer from recliner back to bed for safety. Pt has fear of falling. Once repositione dback into bed, author educated and pt performed several exercises to promote return in strength and function. Transfer via Geophysicist/field seismologist:  Product manager)  Ambulation/Gait      General Gait Details: unable/unsafe at this time   Balance Overall balance assessment: Needs assistance Sitting-balance support: No upper extremity supported, Feet supported Sitting balance-Leahy Scale: Good    Standing balance comment: Pt was unable to stand this date. Will need +2 assistance for any/all standing trials in future. Fear greatly impacts session progression.       Cognition Arousal/Alertness: Awake/alert Behavior During Therapy: Anxious, WFL for tasks assessed/performed Overall Cognitive Status: Within Functional Limits for tasks assessed  General Comments: Lengthy discussion between pt/author about fear of falling. Pt is anxious with all mobility/transfers. She is A  and O x 4 and cooperative however author questions pt's full participation/effort this afternoon.        Exercises General Exercises - Lower Extremity Ankle Circles/Pumps: AROM, Strengthening, Both, 5 reps, Seated Quad Sets: AROM, 10 reps Gluteal Sets: 10 reps Long Arc Quad: AROM, 10 reps        Pertinent Vitals/Pain Pain Assessment Pain Assessment: 0-10 Pain Score: 6  Pain Location: L groin/thigh pain Pain Descriptors / Indicators: Aching, Grimacing, Guarding Pain Intervention(s): Limited activity within patient's tolerance, Monitored during session, Premedicated before session, Repositioned     PT Goals (current goals can now be found in the care plan section) Acute Rehab PT Goals Patient Stated Goal: to return home Progress towards PT goals: Progressing toward goals    Frequency    7X/week      PT Plan Current plan remains appropriate       AM-PAC PT "6 Clicks" Mobility   Outcome Measure  Help needed turning from your back to your side while in a flat bed without using bedrails?: A Lot Help needed moving from lying on your back to sitting on the side of a flat bed without using bedrails?: A Lot Help needed moving to and from a bed to a chair (including a wheelchair)?: A Lot Help needed standing up from a chair using your arms (e.g., wheelchair or bedside chair)?: Total Help needed to walk in hospital room?: Total Help needed climbing 3-5 steps with a railing? : Total 6 Click Score: 9    End of Session Equipment Utilized During Treatment: Gait belt Activity Tolerance: Patient tolerated treatment well Patient left: in bed;with call bell/phone within reach;with bed alarm set Nurse Communication: Mobility status PT Visit Diagnosis: Muscle weakness (generalized) (M62.81);Other abnormalities of gait and mobility (R26.89);Pain Pain - Right/Left: Left Pain - part of body: Hip     Time: 4034-7425 PT Time Calculation (min) (ACUTE ONLY): 38 min  Charges:   $Therapeutic Exercise: 8-22 mins $Therapeutic Activity: 23-37 mins                    Julaine Fusi PTA 10/01/21, 5:14 PM

## 2021-10-01 NOTE — Progress Notes (Signed)
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   Vitals in the emergency department showed temperature of 97.8, respiration rate of 18, heart rate of 99, blood pressure 186/92, SPO2 of 99% on room air. Labs in the emergency department showed serum sodium 137, potassium 4.5, chloride 94, bicarb 27, BUN of 59, serum creatinine of seven 7.75, nonfasting blood glucose 121, GFR of 6. WBC was 4.6, hemoglobin 10, platelets of 168 COVID/influenza A/influenza B PCR were negative.   ED provider ordered a left unilateral with or without pelvis x-ray and portable chest x-ray. Unilateral hip x-ray showed left femur fracture. Follow-up CT redemonstrated left femur fracture at the femoral neck and associated right pelvic fracture Orthopedics and nephrology on consult Status post left hip anterior hemiarthroplasty on 1/30  Assessment & Plan:   Principal Problem:   Femur fracture, left (HCC) Active Problems:   Dyslipidemia   Secondary hyperparathyroidism of renal origin ([place])   Diabetic neuropathy ([place])   End stage renal disease on dialysis ([place])   Type 1 diabetes mellitus with chronic kidney disease on chronic dialysis [facility])   Essential hypertension  Femur fracture, left ([place]) Assessment & Plan  - Orthopedic service has been consulted, appreciate further recommendation -Status post left hip hemiarthroplasty 1/30 Plan: Multimodal pain control-continue Tylenol first choice, Norco 1 every 4 as needed second choice and Dilaudid for severe pain 0.5 IV every 3 as needed Is on TED hose Therapy evaluations  recommending acute inpatient rehab Discharge planning per TOC-awaiting authorizations-family understands that backup plan to skilled facility placement   Essential hypertension Assessment & Plan - Amlodipine held, continuing losartan 50 mg daily - Labetalol injection 5 mg every 2 hours as needed for SBP greater than 170, 4 doses ordered   Type 1 diabetes mellitus with chronic kidney disease on chronic dialysis [facility]) Assessment & Plan - Per endocrinology outpatient note on 2022, patient takes glargine 15 units subcutaneous nightly, however she states that her insurance does not pay for this insulin therefore she is only on sliding scale at this time - Insulin SSI with at bedtime coverage ordered, end-stage renal disease dosing -CBGs ranging 1 73-2 83   End stage renal disease on dialysis [facility]) Hyperkalemia Assessment & Plan - Currently via right IJ - Patient's current regimen is Monday, Tuesday, Wednesday, Thursday, Friday for 2 hours each day as patient is currently being trained to get home hemodialysis -Status post hemodialysis after operating room on 1/30 Plan: Nephrology following for inpatient HD needs  Anemia of acute blood loss postop expected in the setting of ESRD but recent surgery -Patient having access issues and cannot get blood today although it was ordered this morning - Nephrology is ordered IV iron-repeat labs a.m.  ?  Cushingoid appearance- As an outpatient should get 24-hour cortisol based testing as per the patient's endocrinologist  DVT prophylaxis: SCD Code Status: Full Family Communication:Husband at bedside 1/30 sitting in chair-looks somewhat comfortable-states that her ribs hurt Disposition Plan: Status is: Inpatient  Remains inpatient appropriate because: Hip fracture.  Postop day 1 status post hemiarthroplasty     Level of care: Med-Surg  Consultants:  Orthopedics Nephrology  Procedures:  Hip fracture repair  1/30  Antimicrobials: None   Subjective:  From the fall more so than the actual surgery Feels a little bit swollen in her left lower extremity but does not feel pain  Objective: Vitals:   10/01/21 1200 10/01/21 1207 10/01/21 1230 10/01/21 1631  BP: (!) 133/58  132/75 (!) 121/49  Pulse: (!) 101 (!) 102 (!) 102 (!) 108  Resp: 18 (!) 26 16 18   Temp:   98.5 F (36.9 C) 98.5 F (36.9 C)  TempSrc:   Oral   SpO2: 98% 98% 98% 93%  Weight: 80.4 kg     Height:        Intake/Output Summary (Last 24 hours) at 10/01/2021 1800 Last data filed at 10/01/2021 1418 Gross per 24 hour  Intake 120 ml  Output -57 ml  Net 177 ml    Filed Weights   09/29/21 1845 10/01/21 1043 10/01/21 1200  Weight: 85.7 kg 79.2 kg 80.4 kg    Examination:  EOMI NCAT somewhat cushingoid appearance-female pattern baldness Slightly proptosis eyes No thyromegaly Neck soft webbed S1-S2 no murmur Chest clear no added sound no rales no rhonchi Slight left lower extremity swelling   Data Reviewed: I have personally reviewed following labs and imaging studies  CBC: Recent Labs  Lab 09/28/21 1556 09/29/21 0630 09/30/21 0645 10/01/21 0329  WBC 4.6 5.4 9.1 8.7  NEUTROABS 3.4  --   --   --   HGB 10.0* 9.9* 7.7* 6.7*  HCT 31.3* 30.2* 23.7* 20.6*  MCV 101.3* 97.4 100.4* 100.0  PLT 168 158 189 923    Basic Metabolic Panel: Recent Labs  Lab 09/28/21 1556 09/29/21 0630 09/30/21 0645  NA 137 135 133*  K 4.5 5.3* 5.6*  CL 94* 93* 94*  CO2 27 25 25   GLUCOSE 121* 130* 162*  BUN 59* 69* 57*  CREATININE 7.75* 8.78* 7.30*  CALCIUM 10.0 9.4 8.4*  MG  --  2.4  --   PHOS  --  6.3*  --     GFR: Estimated Creatinine Clearance: 9.3 mL/min (A) (by C-G formula based on SCr of 7.3 mg/dL (H)). Liver Function Tests: No results for input(s): AST, ALT, ALKPHOS, BILITOT, PROT, ALBUMIN in the last 168 hours. No results for input(s): LIPASE, AMYLASE in the last 168 hours. No results for input(s): AMMONIA in the  last 168 hours. Coagulation Profile: No results for input(s): INR, PROTIME in the last 168 hours. Cardiac Enzymes: No results for input(s): CKTOTAL, CKMB, CKMBINDEX, TROPONINI in the last 168 hours. BNP (last 3 results) No results for input(s): PROBNP in the last 8760 hours. HbA1C: Recent Labs    09/29/21 0630  HGBA1C 6.2*    CBG: Recent Labs  Lab 09/30/21 1638 09/30/21 1956 10/01/21 0857 10/01/21 1228 10/01/21 1632  GLUCAP 204* 237* 173* 188* 283*    Lipid Profile: No results for input(s): CHOL, HDL, LDLCALC, TRIG, CHOLHDL, LDLDIRECT in the last 72 hours. Thyroid Function Tests: Recent Labs    09/29/21 0630  TSH 2.961    Anemia Panel: Recent Labs    09/29/21 0630  VITAMINB12 640    Sepsis Labs: No results for input(s): PROCALCITON, LATICACIDVEN in the last 168 hours.  Recent Results (from the past 240 hour(s))  Resp Panel by RT-PCR (Flu A&B, Covid) Nasopharyngeal Swab     Status: None   Collection Time: 09/28/21  3:50 PM   Specimen: Nasopharyngeal Swab; Nasopharyngeal(NP) swabs in vial transport medium  Result Value Ref Range Status   SARS Coronavirus 2 by RT PCR NEGATIVE NEGATIVE  Final    Comment: (NOTE) SARS-CoV-2 target nucleic acids are NOT DETECTED.  The SARS-CoV-2 RNA is generally detectable in upper respiratory specimens during the acute phase of infection. The lowest concentration of SARS-CoV-2 viral copies this assay can detect is 138 copies/mL. A negative result does not preclude SARS-Cov-2 infection and should not be used as the sole basis for treatment or other patient management decisions. A negative result may occur with  improper specimen collection/handling, submission of specimen other than nasopharyngeal swab, presence of viral mutation(s) within the areas targeted by this assay, and inadequate number of viral copies(<138 copies/mL). A negative result must be combined with clinical observations, patient history, and  epidemiological information. The expected result is Negative.  Fact Sheet for Patients:  EntrepreneurPulse.com.au  Fact Sheet for Healthcare Providers:  IncredibleEmployment.be  This test is no t yet approved or cleared by the [place] FDA and  has been authorized for detection and/or diagnosis of SARS-CoV-2 by FDA under an Emergency Use Authorization (EUA). This EUA will remain  in effect (meaning this test can be used) for the duration of the COVID-19 declaration under Section 564(b)(1) of the Act, 21 U.S.C.section 360bbb-3(b)(1), unless the authorization is terminated  or revoked sooner.       Influenza A by PCR NEGATIVE NEGATIVE Final   Influenza B by PCR NEGATIVE NEGATIVE Final    Comment: (NOTE) The Xpert Xpress SARS-CoV-2/FLU/RSV plus assay is intended as an aid in the diagnosis of influenza from Nasopharyngeal swab specimens and should not be used as a sole basis for treatment. Nasal washings and aspirates are unacceptable for Xpert Xpress SARS-CoV-2/FLU/RSV testing.  Fact Sheet for Patients: EntrepreneurPulse.com.au  Fact Sheet for Healthcare Providers: IncredibleEmployment.be  This test is not yet approved or cleared by the [place] FDA and has been authorized for detection and/or diagnosis of SARS-CoV-2 by FDA under an Emergency Use Authorization (EUA). This EUA will remain in effect (meaning this test can be used) for the duration of the COVID-19 declaration under Section 564(b)(1) of the Act, 21 U.S.C. section 360bbb-3(b)(1), unless the authorization is terminated or revoked.  Performed at [facility], [address], [place], [place] [address]   Surgical PCR screen     Status: None   Collection Time: 09/29/21  8:13 AM   Specimen: Nasal Mucosa; Nasal Swab  Result Value Ref Range Status   MRSA, PCR NEGATIVE NEGATIVE Final   Staphylococcus aureus NEGATIVE NEGATIVE  Final    Comment: (NOTE) The Xpert SA Assay (FDA approved for NASAL specimens in patients 32 years of age and older), is one component of a comprehensive surveillance program. It is not intended to diagnose infection nor to guide or monitor treatment. Performed at [facility], [address] [place], [place] [address]           Radiology Studies: No results found.      Scheduled Meds:  sodium chloride   Intravenous Once   acetaminophen  650 mg Oral Once   Chlorhexidine Gluconate Cloth  6 each Topical Daily   dextrose  1 ampule Intravenous Once   dextrose  1 ampule Intravenous Once   diphenhydrAMINE  25 mg Oral Once   docusate sodium  100 mg Oral BID   feeding supplement (NEPRO CARB STEADY)  237 mL Oral BID BM   ferric citrate  420 mg Oral TID WC   And   ferric citrate  210 mg Oral With snacks   furosemide  20 mg Intravenous Once  heparin sodium (porcine)       insulin aspart  0-5 Units Subcutaneous QHS   insulin aspart  0-6 Units Subcutaneous TID WC   insulin aspart  10 Units Intravenous Once   [START ON 10/02/2021] losartan  50 mg Oral Daily   magnesium oxide  200 mg Oral BID   methocarbamol  750 mg Oral QID   multivitamin  1 tablet Oral QHS   Continuous Infusions:  iron sucrose 100 mg (10/01/21 1746)   lactated ringers 10 mL/hr at 09/30/21 0600     LOS: 3 days    Time spent: 25 minutes    Nita Sells, MD [facility]   If 7PM-7AM, please contact night-coverage  10/01/2021, 6:00 PM

## 2021-10-01 NOTE — Progress Notes (Signed)
Subjective:  Patient reports pain as mild.    Objective:   VITALS:   Vitals:   09/30/21 1225 09/30/21 1636 09/30/21 1958 10/01/21 0423  BP: (!) 117/53 (!) 102/46 (!) 105/43 (!) 127/54  Pulse: 100 99 (!) 109 98  Resp: 20 16 16 16   Temp: 97.6 F (36.4 C) 98 F (36.7 C) 98.7 F (37.1 C) 97.6 F (36.4 C)  TempSrc: Oral Oral    SpO2: 96% 95% 92% 98%  Weight:      Height:        PHYSICAL EXAM:  Neurologically intact ABD soft Neurovascular intact Sensation intact distally Intact pulses distally Dorsiflexion/Plantar flexion intact Incision: dressing C/D/I No cellulitis present Compartment soft  LABS  Results for orders placed or performed during the hospital encounter of 09/28/21 (from the past 24 hour(s))  Glucose, capillary     Status: Abnormal   Collection Time: 09/30/21  7:50 AM  Result Value Ref Range   Glucose-Capillary 168 (H) 70 - 99 mg/dL  Glucose, capillary     Status: Abnormal   Collection Time: 09/30/21 12:25 PM  Result Value Ref Range   Glucose-Capillary 187 (H) 70 - 99 mg/dL  Glucose, capillary     Status: Abnormal   Collection Time: 09/30/21  4:38 PM  Result Value Ref Range   Glucose-Capillary 204 (H) 70 - 99 mg/dL  Glucose, capillary     Status: Abnormal   Collection Time: 09/30/21  7:56 PM  Result Value Ref Range   Glucose-Capillary 237 (H) 70 - 99 mg/dL   Comment 1 Notify RN    Comment 2 Document in Chart   CBC     Status: Abnormal   Collection Time: 10/01/21  3:29 AM  Result Value Ref Range   WBC 8.7 4.0 - 10.5 K/uL   RBC 2.06 (L) 3.87 - 5.11 MIL/uL   Hemoglobin 6.7 (L) 12.0 - 15.0 g/dL   HCT 20.6 (L) 36.0 - 46.0 %   MCV 100.0 80.0 - 100.0 fL   MCH 32.5 26.0 - 34.0 pg   MCHC 32.5 30.0 - 36.0 g/dL   RDW 13.7 11.5 - 15.5 %   Platelets 167 150 - 400 K/uL   nRBC 0.0 0.0 - 0.2 %  Prepare RBC (crossmatch)     Status: None (Preliminary result)   Collection Time: 10/01/21  6:47 AM  Result Value Ref Range   Order Confirmation PENDING      DG C-Arm 1-60 Min-No Report  Result Date: 09/29/2021 Fluoroscopy was utilized by the requesting physician.  No radiographic interpretation.   DG HIP UNILAT WITH PELVIS 1V LEFT  Result Date: 09/29/2021 CLINICAL DATA:  Left hip arthroplasty, fracture left femur EXAM: DG HIP (WITH OR WITHOUT PELVIS) 1V*L* COMPARISON:  09/28/2021 FINDINGS: Fluoroscopic images show interval left hip arthroplasty. Fluoroscopic time was 6 seconds. Radiation dose is 0.67 mGy. IMPRESSION: Fluoroscopic assistance was provided for left hip arthroplasty. Electronically Signed   By: Elmer Picker M.D.   On: 09/29/2021 17:02    Assessment/Plan: 2 Days Post-Op   Principal Problem:   Femur fracture, left (HCC) Active Problems:   Dyslipidemia   Secondary hyperparathyroidism of renal origin ([place])   Diabetic neuropathy ([place])   End stage renal disease on dialysis ([place])   Type 1 diabetes mellitus with chronic kidney disease on chronic dialysis ([place])   Essential hypertension   Up with therapy Discharge to SNF likely Dressing change intact Hydrocodone for pain relief as needed (rx in chart) WBAT on the left hip  and the right side (rami fracture) RTC in 2 weeks for staple removal Follow up in office 10/10/21 call to confirm appointment Pimaco Two , PA-C 10/01/2021, 7:06 AM

## 2021-10-01 NOTE — Progress Notes (Signed)
Inpatient Rehabilitation Admissions Coordinator   I spoke with spouse by phone at her bedside. He prefers Cir admit rather than SNF when medically ready. Noted that her Charolette Forward cath is in need of replacement to be better dialyzed. He states that is planned for Thursday. Once medically ready I feel she would be a great candidate for CIR. I will follow.  Danne Baxter, RN, MSN Rehab Admissions Coordinator [phone] 10/01/2021 5:43 PM

## 2021-10-01 NOTE — Progress Notes (Signed)
Occupational Therapy Treatment Patient Details Name: [name] MRN: [id number] DOB: [birth year] Today's Date: 10/01/2021   History of present illness presented to ER secondary to L hip pain; noted with R inferior pubic ramus frature (WBAT), L femoral neck fracture s/p L hemiarthroplast (WBAT), 09/29/21.   OT comments  [name] was seen for OT treatment on this date. Upon arrival to room pt reclined in bed, husband at bedside, pt agreeable to tx. Pt requires MOD A exit L side of bed. MAX A for LBD seated EOB. SETUP + SUPERVISION seated self-drinking with good balance. MOD A for lateral scoot bed>chair, +2 assistance from husband for safety to stabilize chair. Pt instructed on BLE therex (handout provided). Pt making good progress toward goals. Pt continues to benefit from skilled OT services to maximize return to PLOF and minimize risk of future falls, injury, caregiver burden, and readmission. Will continue to follow POC. Discharge recommendation remains appropriate.     Recommendations for follow up therapy are one component of a multi-disciplinary discharge planning process, led by the attending physician.  Recommendations may be updated based on patient status, additional functional criteria and insurance authorization.    Follow Up Recommendations  Acute inpatient rehab (3hours/day)    Assistance Recommended at Discharge Frequent or constant Supervision/Assistance  Patient can return home with the following  Two people to help with walking and/or transfers;Two people to help with bathing/dressing/bathroom   Equipment Recommendations  Other (comment) (defer to next venue of care)    Recommendations for Other Services      Precautions / Restrictions Precautions Precautions: Fall Precaution Comments: L AVF, R IJ perm-cath Restrictions Weight Bearing Restrictions: Yes RLE Weight Bearing: Weight bearing as tolerated LLE Weight Bearing: Weight bearing as tolerated       Mobility  Bed Mobility Overal bed mobility: Needs Assistance Bed Mobility: Supine to Sit     Supine to sit: Mod assist     General bed mobility comments: assist for trunk and LLE    Transfers Overall transfer level: Needs assistance   Transfers: Bed to chair/wheelchair/BSC            Lateral/Scoot Transfers: Mod assist General transfer comment: attempted standing trial using RW, unabel to achieve lift off with +1 assist 2/2 increased pain in LLE     Balance Overall balance assessment: Needs assistance Sitting-balance support: No upper extremity supported, Feet supported Sitting balance-Leahy Scale: Good                                     ADL either performed or assessed with clinical judgement   ADL Overall ADL's : Needs assistance/impaired                                       General ADL Comments: MAX A for LBD seated EOB. SETUP + SUPERVISION seated self-drinking. MOD A for lateral scoot ADL t/f.      Cognition Arousal/Alertness: Awake/alert Behavior During Therapy: WFL for tasks assessed/performed Overall Cognitive Status: Within Functional Limits for tasks assessed                                          Exercises Exercises: General Lower Extremity General Exercises - Lower Extremity  Ankle Circles/Pumps: AROM, Strengthening, Both, 5 reps, Seated Gluteal Sets: AROM, Strengthening, Both, 5 reps, Seated Long Arc Quad: AROM, Strengthening, Both, 5 reps, Seated Toe Raises: AROM, Strengthening, Both, 5 reps, Seated Heel Raises: AROM, Strengthening, Both, 5 reps, Seated            Pertinent Vitals/ Pain       Pain Assessment Pain Assessment: 0-10 Pain Score: 6  Pain Location: L quad Pain Descriptors / Indicators: Aching, Grimacing, Guarding Pain Intervention(s): Limited activity within patient's tolerance, Repositioned   Frequency  Min 3X/week        Progress Toward Goals  OT Goals(current goals can now  be found in the care plan section)  Progress towards OT goals: Progressing toward goals  Acute Rehab OT Goals Patient Stated Goal: to return to walking OT Goal Formulation: With patient/family Time For Goal Achievement: 10/14/21 Potential to Achieve Goals: Good ADL Goals Pt Will Perform Grooming: with min assist;standing Pt Will Perform Lower Body Dressing: sit to/from stand;with min assist Pt Will Transfer to Toilet: with min assist;ambulating;regular height toilet  Plan Discharge plan remains appropriate;Frequency remains appropriate    Co-evaluation                 AM-PAC OT "6 Clicks" Daily Activity     Outcome Measure   Help from another person eating meals?: None Help from another person taking care of personal grooming?: A Little Help from another person toileting, which includes using toliet, bedpan, or urinal?: A Lot Help from another person bathing (including washing, rinsing, drying)?: A Lot Help from another person to put on and taking off regular upper body clothing?: A Little Help from another person to put on and taking off regular lower body clothing?: A Lot 6 Click Score: 16    End of Session Equipment Utilized During Treatment: Gait belt;Oxygen  OT Visit Diagnosis: Unsteadiness on feet (R26.81);Other abnormalities of gait and mobility (R26.89);Muscle weakness (generalized) (M62.81)   Activity Tolerance Patient tolerated treatment well   Patient Left in chair;with call bell/phone within reach;with family/visitor present   Nurse Communication          Time: 3419-6222 OT Time Calculation (min): 45 min  Charges: OT General Charges $OT Visit: 1 Visit OT Treatments $Self Care/Home Management : 23-37 mins $Therapeutic Exercise: 8-22 mins  Dessie Coma, M.S. OTR/L  10/01/21, 2:38 PM  ascom [phone]

## 2021-10-01 NOTE — Care Management Important Message (Signed)
Important Message  Patient Details  Name: [name] MRN: [id number] Date of Birth: Jul 26, 1966   Medicare Important Message Given:  Yes     Juliann Pulse A Jamol Ginyard 10/01/2021, 3:20 PM

## 2021-10-01 NOTE — Progress Notes (Signed)
[facility]  ROUNDING NOTE   Subjective:   Patient seen and evaluated during dialysis   HEMODIALYSIS FLOWSHEET:  Blood Flow Rate (mL/min): 100 mL/min Arterial Pressure (mmHg): -260 mmHg Venous Pressure (mmHg): 20 mmHg Transmembrane Pressure (mmHg): 70 mmHg Ultrafiltration Rate (mL/min): 760 mL/min Dialysate Flow Rate (mL/min): 500 ml/min Conductivity: Machine : 13.8 Conductivity: Machine : 13.8 Dialysis Fluid Bolus: Normal Saline Bolus Amount (mL): 250 mL  Patient has no complaints at this time Pain managed on current regimen Per HD RN, BFR reduced due to elevated arterial pressures   Objective:  Vital signs in last 24 hours:  Temp:  [97.6 F (36.4 C)-98.9 F (37.2 C)] 98.6 F (37 C) (02/01 1140) Pulse Rate:  [96-109] 102 (02/01 1207) Resp:  [16-26] 26 (02/01 1207) BP: (102-133)/(43-58) 133/58 (02/01 1200) SpO2:  [92 %-100 %] 98 % (02/01 1207) Weight:  [79.2 kg-80.4 kg] 80.4 kg (02/01 1200)  Weight change:  Filed Weights   09/29/21 1845 10/01/21 1043 10/01/21 1200  Weight: 85.7 kg 79.2 kg 80.4 kg    Intake/Output: I/O last 3 completed shifts: In: 90 [I.V.:90] Out: 1029 [Other:1029]   Intake/Output this shift:  Total I/O In: 120 [P.O.:120] Out: -57   Physical Exam: General: NAD  Head: Normocephalic, atraumatic. Moist oral mucosal membranes  Eyes: Anicteric  Lungs:  Clear to auscultation, normal effort  Heart: S1S2 no rubs  Abdomen:  Soft, nontender, bowel sounds present  Extremities: trace peripheral edema.  Neurologic: Awake, alert, following commands  Skin: No acute rash, left hip surgical dressing  Access: IJ PermCath    Basic Metabolic Panel: Recent Labs  Lab 09/28/21 1556 09/29/21 0630 09/30/21 0645  NA 137 135 133*  K 4.5 5.3* 5.6*  CL 94* 93* 94*  CO2 27 25 25   GLUCOSE 121* 130* 162*  BUN 59* 69* 57*  CREATININE 7.75* 8.78* 7.30*  CALCIUM 10.0 9.4 8.4*  MG  --  2.4  --   PHOS  --  6.3*  --      Liver Function  Tests: No results for input(s): AST, ALT, ALKPHOS, BILITOT, PROT, ALBUMIN in the last 168 hours. No results for input(s): LIPASE, AMYLASE in the last 168 hours. No results for input(s): AMMONIA in the last 168 hours.  CBC: Recent Labs  Lab 09/28/21 1556 09/29/21 0630 09/30/21 0645 10/01/21 0329  WBC 4.6 5.4 9.1 8.7  NEUTROABS 3.4  --   --   --   HGB 10.0* 9.9* 7.7* 6.7*  HCT 31.3* 30.2* 23.7* 20.6*  MCV 101.3* 97.4 100.4* 100.0  PLT 168 158 189 167     Cardiac Enzymes: No results for input(s): CKTOTAL, CKMB, CKMBINDEX, TROPONINI in the last 168 hours.  BNP: Invalid input(s): POCBNP  CBG: Recent Labs  Lab 09/30/21 1225 09/30/21 1638 09/30/21 1956 10/01/21 0857 10/01/21 1228  GLUCAP 187* 204* 237* 173* 188*     Microbiology: Results for orders placed or performed during the hospital encounter of 09/28/21  Resp Panel by RT-PCR (Flu A&B, Covid) Nasopharyngeal Swab     Status: None   Collection Time: 09/28/21  3:50 PM   Specimen: Nasopharyngeal Swab; Nasopharyngeal(NP) swabs in vial transport medium  Result Value Ref Range Status   SARS Coronavirus 2 by RT PCR NEGATIVE NEGATIVE Final    Comment: (NOTE) SARS-CoV-2 target nucleic acids are NOT DETECTED.  The SARS-CoV-2 RNA is generally detectable in upper respiratory specimens during the acute phase of infection. The lowest concentration of SARS-CoV-2 viral copies this assay can detect is  138 copies/mL. A negative result does not preclude SARS-Cov-2 infection and should not be used as the sole basis for treatment or other patient management decisions. A negative result may occur with  improper specimen collection/handling, submission of specimen other than nasopharyngeal swab, presence of viral mutation(s) within the areas targeted by this assay, and inadequate number of viral copies(<138 copies/mL). A negative result must be combined with clinical observations, patient history, and epidemiological information.  The expected result is Negative.  Fact Sheet for Patients:  EntrepreneurPulse.com.au  Fact Sheet for Healthcare Providers:  IncredibleEmployment.be  This test is no t yet approved or cleared by the [place] FDA and  has been authorized for detection and/or diagnosis of SARS-CoV-2 by FDA under an Emergency Use Authorization (EUA). This EUA will remain  in effect (meaning this test can be used) for the duration of the COVID-19 declaration under Section 564(b)(1) of the Act, 21 U.S.C.section 360bbb-3(b)(1), unless the authorization is terminated  or revoked sooner.       Influenza A by PCR NEGATIVE NEGATIVE Final   Influenza B by PCR NEGATIVE NEGATIVE Final    Comment: (NOTE) The Xpert Xpress SARS-CoV-2/FLU/RSV plus assay is intended as an aid in the diagnosis of influenza from Nasopharyngeal swab specimens and should not be used as a sole basis for treatment. Nasal washings and aspirates are unacceptable for Xpert Xpress SARS-CoV-2/FLU/RSV testing.  Fact Sheet for Patients: EntrepreneurPulse.com.au  Fact Sheet for Healthcare Providers: IncredibleEmployment.be  This test is not yet approved or cleared by the [place] FDA and has been authorized for detection and/or diagnosis of SARS-CoV-2 by FDA under an Emergency Use Authorization (EUA). This EUA will remain in effect (meaning this test can be used) for the duration of the COVID-19 declaration under Section 564(b)(1) of the Act, 21 U.S.C. section 360bbb-3(b)(1), unless the authorization is terminated or revoked.  Performed at [facility], [address] [place], [place] [address]   Surgical PCR screen     Status: None   Collection Time: 09/29/21  8:13 AM   Specimen: Nasal Mucosa; Nasal Swab  Result Value Ref Range Status   MRSA, PCR NEGATIVE NEGATIVE Final   Staphylococcus aureus NEGATIVE NEGATIVE Final    Comment: (NOTE) The  Xpert SA Assay (FDA approved for NASAL specimens in patients 24 years of age and older), is one component of a comprehensive surveillance program. It is not intended to diagnose infection nor to guide or monitor treatment. Performed at [facility], [address] [place], [place] [address]     Coagulation Studies: No results for input(s): LABPROT, INR in the last 72 hours.  Urinalysis: No results for input(s): COLORURINE, LABSPEC, PHURINE, GLUCOSEU, HGBUR, BILIRUBINUR, KETONESUR, PROTEINUR, UROBILINOGEN, NITRITE, LEUKOCYTESUR in the last 72 hours.  Invalid input(s): APPERANCEUR    Imaging: DG C-Arm 1-60 Min-No Report  Result Date: 09/29/2021 Fluoroscopy was utilized by the requesting physician.  No radiographic interpretation.   DG HIP UNILAT WITH PELVIS 1V LEFT  Result Date: 09/29/2021 CLINICAL DATA:  Left hip arthroplasty, fracture left femur EXAM: DG HIP (WITH OR WITHOUT PELVIS) 1V*L* COMPARISON:  09/28/2021 FINDINGS: Fluoroscopic images show interval left hip arthroplasty. Fluoroscopic time was 6 seconds. Radiation dose is 0.67 mGy. IMPRESSION: Fluoroscopic assistance was provided for left hip arthroplasty. Electronically Signed   By: Elmer Picker M.D.   On: 09/29/2021 17:02     Medications:    iron sucrose     lactated ringers 10 mL/hr at 09/30/21 0600    sodium chloride  Intravenous Once   acetaminophen  650 mg Oral Once   Chlorhexidine Gluconate Cloth  6 each Topical Daily   dextrose  1 ampule Intravenous Once   dextrose  1 ampule Intravenous Once   diphenhydrAMINE  25 mg Oral Once   docusate sodium  100 mg Oral BID   feeding supplement (NEPRO CARB STEADY)  237 mL Oral BID BM   ferric citrate  420 mg Oral TID WC   And   ferric citrate  210 mg Oral With snacks   furosemide  20 mg Intravenous Once   heparin sodium (porcine)       insulin aspart  0-5 Units Subcutaneous QHS   insulin aspart  0-6 Units Subcutaneous TID WC   insulin aspart  10  Units Intravenous Once   [START ON 10/02/2021] losartan  50 mg Oral Daily   magnesium oxide  200 mg Oral BID   methocarbamol  750 mg Oral QID   multivitamin  1 tablet Oral QHS   alum & mag hydroxide-simeth, bisacodyl, hydrALAZINE, HYDROcodone-acetaminophen, HYDROmorphone (DILAUDID) injection, labetalol, magnesium hydroxide, melatonin, menthol-cetylpyridinium **OR** phenol, metoCLOPramide **OR** metoCLOPramide (REGLAN) injection, ondansetron **OR** ondansetron (ZOFRAN) IV  Assessment/ Plan:  56 y.o. female with past medical history of ESRD on HD undergoing training for HHD, anemia of chronic kidney disease, severe secondary hyperparathyroidism, hypertension, diabetes mellitus type 1, peripheral neuropathy, hyperlipidemia who presents now with left hip fracture.  [facility] [place]/MWF/right PermCath/81kg  1.  ESRD with hyperkalemia on dialysis.  She was undergoing treatment training for home hemodialysis.  Training on hold due to fractured hip.  Patient received shortened treatment early yesterday morning with permacath malfunction.  HD RN O PermCath with no relief of elevated pressure.  Attempts made to dialyze patient today with repeated compliance and below flow rate reduced to 100.  Consulted vascular to evaluate and possibly exchange PermCath.  Potassium elevated at 5.6, ordered [place] for management.   2.  Anemia of chronic kidney disease.  Hemoglobin 6.7.  Primary team has ordered 1 unit RBCs for transfusion, however patient prefers iron injection.  Venofer 100 mg IV once ordered.  Original plan to transfuse blood during dialysis however drastically decreased blood flow rate would prevent successful transfusion.  3.  Secondary hyperparathyroidism.  Awaiting outpatient scheduling for evaluation of parathyroidectomy     LOS: [place] 2/1/202312:30 PM

## 2021-10-01 NOTE — Plan of Care (Signed)

## 2021-10-02 ENCOUNTER — Inpatient Hospital Stay: Payer: Medicare Other

## 2021-10-02 DIAGNOSIS — S72102A Unspecified trochanteric fracture of left femur, initial encounter for closed fracture: Secondary | ICD-10-CM | POA: Diagnosis not present

## 2021-10-02 LAB — RENAL FUNCTION PANEL
Albumin: 2.9 g/dL — ABNORMAL LOW (ref 3.5–5.0)
Anion gap: 14 (ref 5–15)
BUN: 77 mg/dL — ABNORMAL HIGH (ref 6–20)
CO2: 25 mmol/L (ref 22–32)
Calcium: 9 mg/dL (ref 8.9–10.3)
Chloride: 94 mmol/L — ABNORMAL LOW (ref 98–111)
Creatinine, Ser: 9.21 mg/dL — ABNORMAL HIGH (ref 0.44–1.00)
GFR, Estimated: 5 mL/min — ABNORMAL LOW (ref >60)
Glucose, Bld: 183 mg/dL — ABNORMAL HIGH (ref 70–99)
Phosphorus: 6 mg/dL — ABNORMAL HIGH (ref 2.5–4.6)
Potassium: 6.1 mmol/L — ABNORMAL HIGH (ref 3.5–5.1)
Sodium: 133 mmol/L — ABNORMAL LOW (ref 135–145)

## 2021-10-02 LAB — CBC WITH DIFFERENTIAL/PLATELET
Abs Immature Granulocytes: 0.08 10*3/uL — ABNORMAL HIGH (ref 0.00–0.07)
Basophils Absolute: 0.1 10*3/uL (ref 0.0–0.1)
Basophils Relative: 1 %
Eosinophils Absolute: 0.1 10*3/uL (ref 0.0–0.5)
Eosinophils Relative: 1 %
HCT: 19.5 % — ABNORMAL LOW (ref 36.0–46.0)
Hemoglobin: 6.3 g/dL — ABNORMAL LOW (ref 12.0–15.0)
Immature Granulocytes: 1 %
Lymphocytes Relative: 10 %
Lymphs Abs: 0.7 10*3/uL (ref 0.7–4.0)
MCH: 32.8 pg (ref 26.0–34.0)
MCHC: 32.3 g/dL (ref 30.0–36.0)
MCV: 101.6 fL — ABNORMAL HIGH (ref 80.0–100.0)
Monocytes Absolute: 0.8 10*3/uL (ref 0.1–1.0)
Monocytes Relative: 11 %
Neutro Abs: 5.6 10*3/uL (ref 1.7–7.7)
Neutrophils Relative %: 76 %
Platelets: 169 10*3/uL (ref 150–400)
RBC: 1.92 MIL/uL — ABNORMAL LOW (ref 3.87–5.11)
RDW: 14.1 % (ref 11.5–15.5)
WBC: 7.3 10*3/uL (ref 4.0–10.5)
nRBC: 0 % (ref 0.0–0.2)

## 2021-10-02 LAB — GLUCOSE, CAPILLARY
Glucose-Capillary: 167 mg/dL — ABNORMAL HIGH (ref 70–99)
Glucose-Capillary: 200 mg/dL — ABNORMAL HIGH (ref 70–99)
Glucose-Capillary: 222 mg/dL — ABNORMAL HIGH (ref 70–99)
Glucose-Capillary: 213 mg/dL — ABNORMAL HIGH (ref 70–99)

## 2021-10-02 LAB — PREPARE RBC (CROSSMATCH)

## 2021-10-02 IMAGING — DX DG HIP (WITH OR WITHOUT PELVIS) 2-3V*L*
3 series · 3 of 3 positions shown · non-contrast
Comparison: Left hip fluoroscopy [DATE]

CLINICAL DATA: Left hip pain.

EXAM:
DG HIP (WITH OR WITHOUT PELVIS) 2-3V LEFT

[pelvis ap]
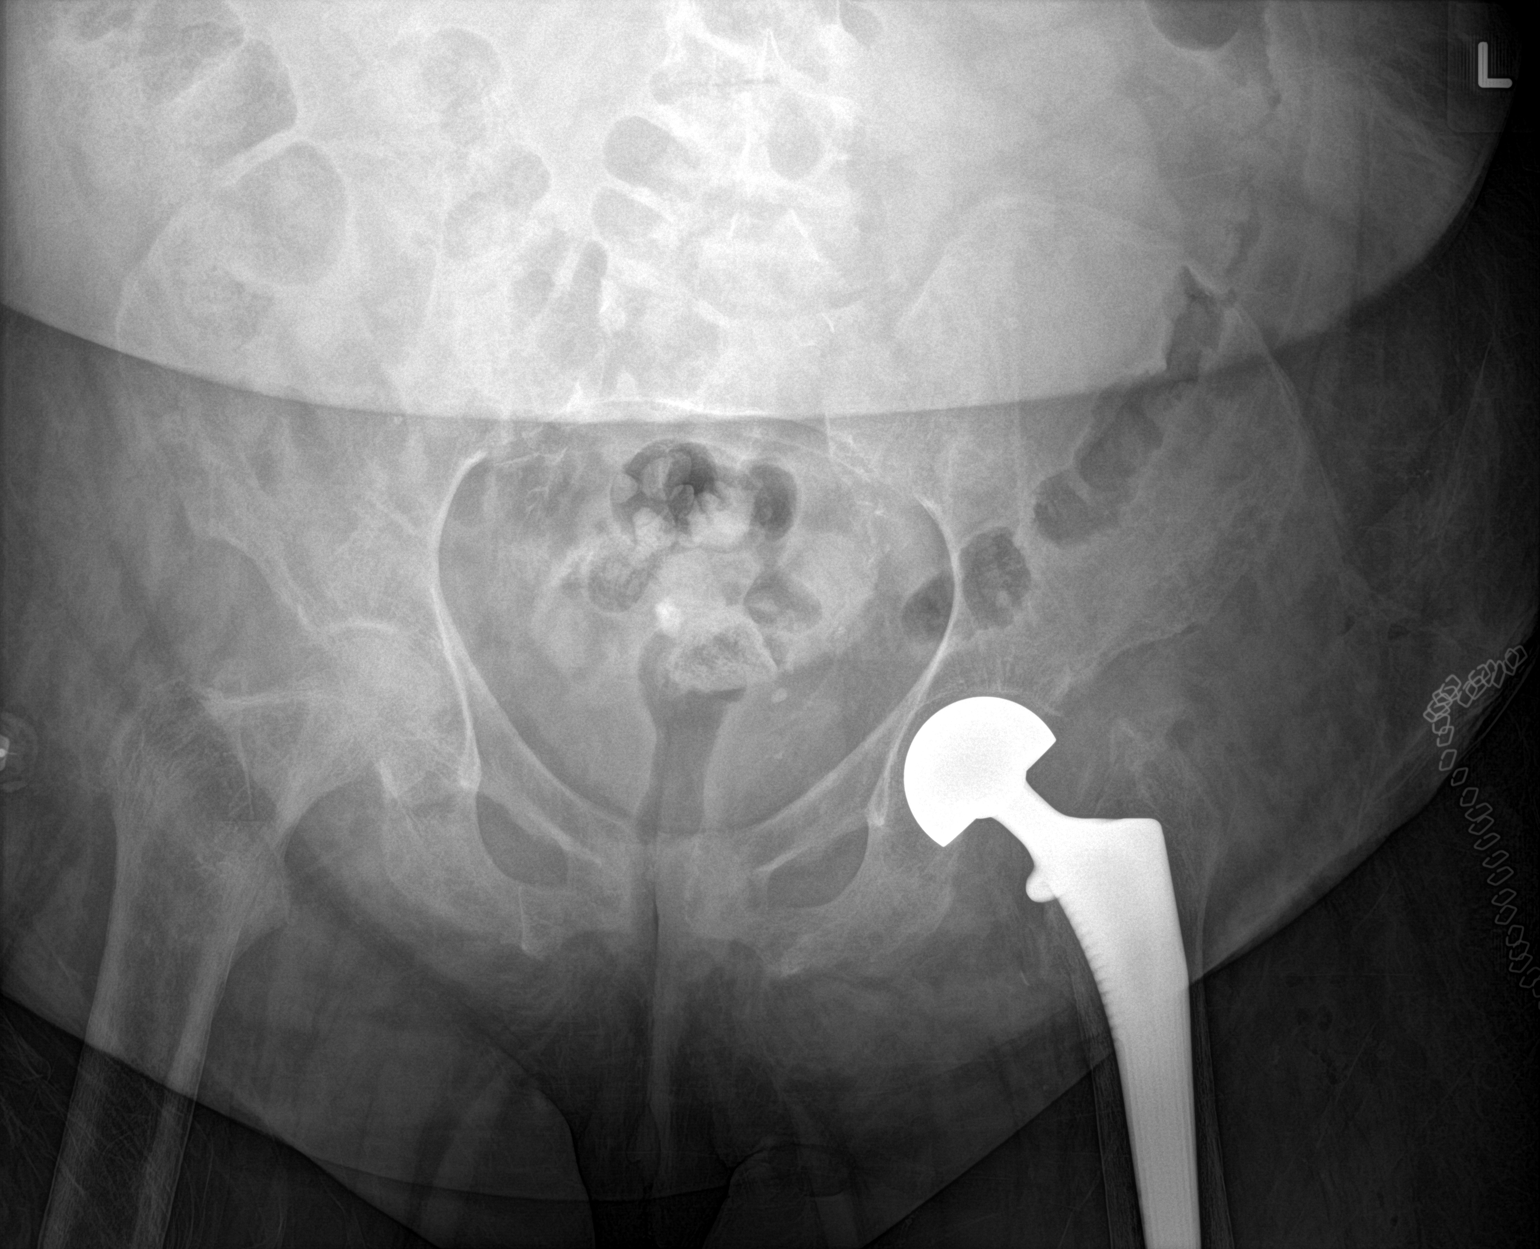

[hip lat]
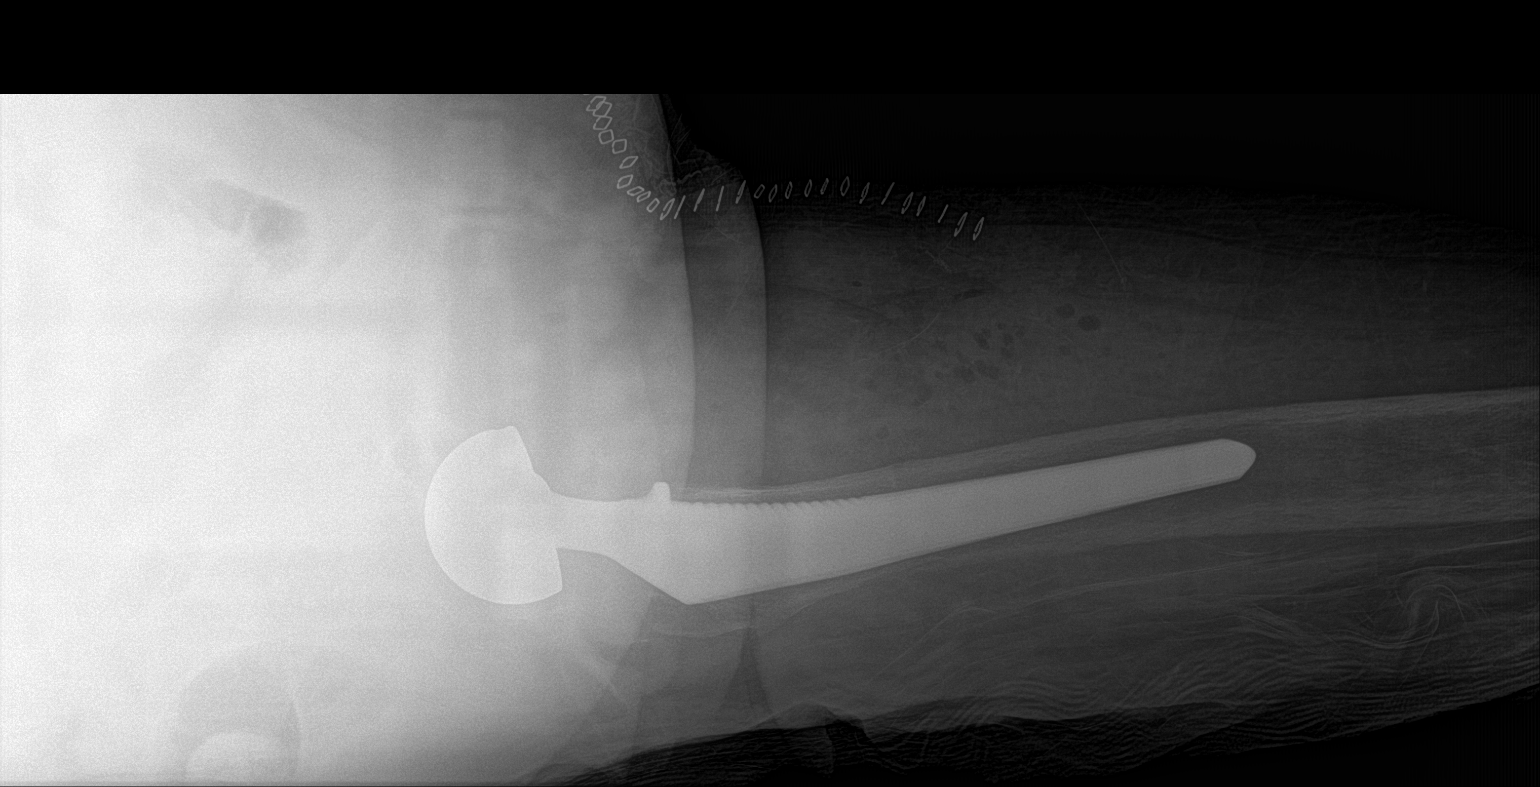

[hip ap]
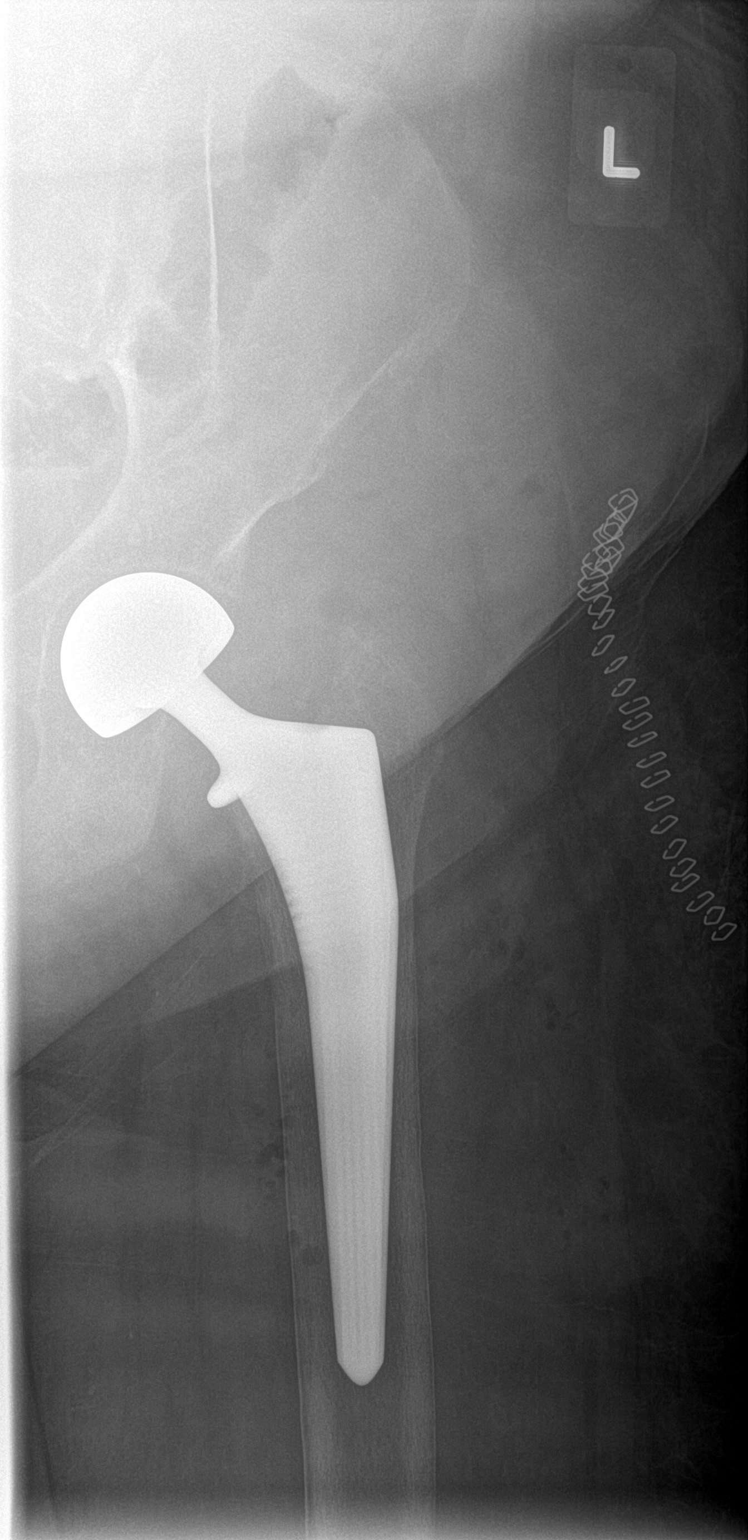

[3 of 3 positions shown; findings below may reference images not displayed]

FINDINGS: Interval total left hip arthroplasty. No perihardware lucency is
seen to indicate hardware failure or loosening. Mild-to-moderate
right femoroacetabular joint space narrowing. There is diffuse
decreased bone mineralization. Expected postoperative changes of the
left hip including lateral subcutaneous air and lateral surgical
skin staples.
IMPRESSION: Interval total left hip arthroplasty without evidence of hardware
failure.

## 2021-10-02 MED ORDER — DICLOFENAC EPOLAMINE 1.3 % EX PTCH
1.0000 | MEDICATED_PATCH | Freq: Two times a day (BID) | CUTANEOUS | Status: DC
  Administered 2021-10-02 – 2021-10-06 (×8): 1 via TRANSDERMAL
  Filled 2021-10-02 (×10): qty 1

## 2021-10-02 MED ORDER — SODIUM CHLORIDE 0.9% IV SOLUTION: Freq: Once | INTRAVENOUS | Status: AC

## 2021-10-02 MED ORDER — PATIROMER SORBITEX CALCIUM 8.4 G PO PACK
25.2000 g | PACK | Freq: Once | ORAL | Status: AC
  Administered 2021-10-02: 25.2 g via ORAL
  Filled 2021-10-02: qty 3

## 2021-10-02 MED ORDER — SODIUM ZIRCONIUM CYCLOSILICATE 10 G PO PACK
10.0000 g | PACK | Freq: Once | ORAL | Status: AC
  Administered 2021-10-02: 10 g via ORAL
  Filled 2021-10-02: qty 1

## 2021-10-02 MED ORDER — CEFAZOLIN SODIUM-DEXTROSE 2-4 GM/100ML-% IV SOLN
2.0000 g | INTRAVENOUS | Status: AC
  Filled 2021-10-02: qty 100

## 2021-10-02 MED ORDER — ACETAMINOPHEN 325 MG PO TABS
650.0000 mg | ORAL_TABLET | Freq: Once | ORAL | Status: AC
  Administered 2021-10-02: 650 mg via ORAL
  Filled 2021-10-02: qty 2

## 2021-10-02 MED ORDER — VITAMIN D (ERGOCALCIFEROL) 1.25 MG (50000 UNIT) PO CAPS
50000.0000 [IU] | ORAL_CAPSULE | ORAL | Status: DC
  Administered 2021-10-02: 50000 [IU] via ORAL
  Filled 2021-10-02: qty 1

## 2021-10-02 MED ORDER — DIPHENHYDRAMINE HCL 25 MG PO CAPS
25.0000 mg | ORAL_CAPSULE | Freq: Once | ORAL | Status: AC
  Administered 2021-10-02: 25 mg via ORAL
  Filled 2021-10-02: qty 1

## 2021-10-02 MED ORDER — SODIUM ZIRCONIUM CYCLOSILICATE 10 G PO PACK: 10.0000 g | PACK | Freq: Every day | ORAL | Status: DC

## 2021-10-02 NOTE — Progress Notes (Addendum)
Patient felt a pop in her thigh, x-ray obtained, no change in appearance from prior films. Please continue with PT, she is WBAT.  Subjective:  Patient reports pain as mild.    Objective:   VITALS:   Vitals:   10/01/21 1230 10/01/21 1631 10/01/21 1938 10/02/21 0326  BP: 132/75 (!) 121/49 (!) 136/56 (!) 130/53  Pulse: (!) 102 (!) 108 (!) 107 (!) 101  Resp: 16 18 16 14   Temp: 98.5 F (36.9 C) 98.5 F (36.9 C) 98.6 F (37 C) 98.2 F (36.8 C)  TempSrc: Oral     SpO2: 98% 93% 92% 96%  Weight:      Height:        PHYSICAL EXAM:  Neurologically intact ABD soft Neurovascular intact Sensation intact distally Intact pulses distally Dorsiflexion/Plantar flexion intact Incision: dressing C/D/I No cellulitis present Compartment soft  LABS  Results for orders placed or performed during the hospital encounter of 09/28/21 (from the past 24 hour(s))  Prepare RBC (crossmatch)     Status: None   Collection Time: 10/01/21  6:47 AM  Result Value Ref Range   Order Confirmation      ORDER PROCESSED BY BLOOD BANK Performed at [facility], [address] [place], [place] [address]   Type and screen [place]     Status: None (Preliminary result)   Collection Time: 10/01/21  8:05 AM  Result Value Ref Range   ABO/RH(D) A POS    Antibody Screen NEG    Sample Expiration 10/04/2021,2359    Unit Number [id number]    Blood Component Type RBC, LR IRR    Unit division 00    Status of Unit REL FROM [facility]    Transfusion Status OK TO TRANSFUSE    Crossmatch Result      Compatible Performed at [facility], [address] [place], [place] [address]    Unit Number [id number]    Blood Component Type RED CELLS,LR    Unit division 00    Status of Unit ALLOCATED    Transfusion Status OK TO TRANSFUSE    Crossmatch Result Compatible   Glucose, capillary     Status: Abnormal   Collection Time: 10/01/21  8:57 AM  Result Value Ref Range    Glucose-Capillary 173 (H) 70 - 99 mg/dL  Glucose, capillary     Status: Abnormal   Collection Time: 10/01/21 12:28 PM  Result Value Ref Range   Glucose-Capillary 188 (H) 70 - 99 mg/dL  Glucose, capillary     Status: Abnormal   Collection Time: 10/01/21  4:32 PM  Result Value Ref Range   Glucose-Capillary 283 (H) 70 - 99 mg/dL  Glucose, capillary     Status: Abnormal   Collection Time: 10/01/21 10:13 PM  Result Value Ref Range   Glucose-Capillary 177 (H) 70 - 99 mg/dL  CBC with Differential/Platelet     Status: Abnormal   Collection Time: 10/02/21  4:17 AM  Result Value Ref Range   WBC 7.3 4.0 - 10.5 K/uL   RBC 1.92 (L) 3.87 - 5.11 MIL/uL   Hemoglobin 6.3 (L) 12.0 - 15.0 g/dL   HCT 19.5 (L) 36.0 - 46.0 %   MCV 101.6 (H) 80.0 - 100.0 fL   MCH 32.8 26.0 - 34.0 pg   MCHC 32.3 30.0 - 36.0 g/dL   RDW 14.1 11.5 - 15.5 %   Platelets 169 150 - 400 K/uL   nRBC 0.0 0.0 - 0.2 %   Neutrophils Relative %  76 %   Neutro Abs 5.6 1.7 - 7.7 K/uL   Lymphocytes Relative 10 %   Lymphs Abs 0.7 0.7 - 4.0 K/uL   Monocytes Relative 11 %   Monocytes Absolute 0.8 0.1 - 1.0 K/uL   Eosinophils Relative 1 %   Eosinophils Absolute 0.1 0.0 - 0.5 K/uL   Basophils Relative 1 %   Basophils Absolute 0.1 0.0 - 0.1 K/uL   Immature Granulocytes 1 %   Abs Immature Granulocytes 0.08 (H) 0.00 - 0.07 K/uL  Renal function panel     Status: Abnormal   Collection Time: 10/02/21  4:17 AM  Result Value Ref Range   Sodium 133 (L) 135 - 145 mmol/L   Potassium 6.1 (H) 3.5 - 5.1 mmol/L   Chloride 94 (L) 98 - 111 mmol/L   CO2 25 22 - 32 mmol/L   Glucose, Bld 183 (H) 70 - 99 mg/dL   BUN 77 (H) 6 - 20 mg/dL   Creatinine, Ser 9.21 (H) 0.44 - 1.00 mg/dL   Calcium 9.0 8.9 - 10.3 mg/dL   Phosphorus 6.0 (H) 2.5 - 4.6 mg/dL   Albumin 2.9 (L) 3.5 - 5.0 g/dL   GFR, Estimated 5 (L) >60 mL/min   Anion gap 14 5 - 15    No results found.  Assessment/Plan: 3 Days Post-Op   Principal Problem:   Femur fracture, left  (HCC) Active Problems:   Dyslipidemia   Secondary hyperparathyroidism of renal origin ([place])   Diabetic neuropathy ([place])   End stage renal disease on dialysis ([place])   Type 1 diabetes mellitus with chronic kidney disease on chronic dialysis [facility])   Essential hypertension   Up with therapy Discharge to SNF vs CIR Dressing change intact Hydrocodone for pain relief as needed (rx in chart) WBAT on the left hip and the right side (rami fracture) RTC in 2 weeks for staple removal Follow up in office 10/10/21 call to confirm appointment [place] , PA-C 10/02/2021, 6:15 AM

## 2021-10-02 NOTE — Progress Notes (Signed)
PT Cancellation Note  Patient Details Name: [name] MRN: [id number] DOB: 08-20-1966   Cancelled Treatment:     Pt has elevated K+ and low Hgb. Has received Rx for K+ and is going to be receiving transfusion later this date. PT will return post transfusion and continue to follow per current POC. Scheduled perm cath replacement tomorrow.    Willette Pa 10/02/2021, 10:14 AM

## 2021-10-02 NOTE — Progress Notes (Signed)
Inpatient Diabetes Program Recommendations  AACE/ADA: New Consensus Statement on Inpatient Glycemic Control (2015)  Target Ranges:  Prepandial:   less than 140 mg/dL      Peak postprandial:   less than 180 mg/dL (1-2 hours)      Critically ill patients:  140 - 180 mg/dL   Lab Results  Component Value Date   GLUCAP 200 (H) 10/02/2021   HGBA1C 6.2 (H) 09/29/2021    Review of Glycemic Control  Latest Reference Range & Units 10/01/21 12:28 10/01/21 16:32 10/01/21 22:13 10/02/21 08:05 10/02/21 11:12  Glucose-Capillary 70 - 99 mg/dL 188 (H) 283 (H) 177 (H) 167 (H) 200 (H)   Diabetes history: DM  Outpatient Diabetes medications:  Novolog Flexpen 10-22 units tid (does not take basal) Current orders for Inpatient glycemic control:  Novolog very sensitive (0-6 units) tid with meals and HS  Inpatient Diabetes Program Recommendations:    Referral received.  Patient is wearing a Dexcom sensor on her abdomen but does not have her phone here to act as the reader.  Explained that she was welcome to wear sensor, but we would still have to validate with CBG's.  She verbalized understanding.  States that her endocrinologist left the practice and she is looking for a new one.  A1C indicates good control.  Her sensor does alarm when her blood sugars is dropping. Will follow.   Thanks  Adah Perl, RN, BC-ADM Inpatient Diabetes Coordinator Pager [phone]  (8a-5p)

## 2021-10-02 NOTE — Plan of Care (Signed)

## 2021-10-02 NOTE — Consult Note (Signed)
_0 @   MRN : [id number]  [name] is a 56 y.o. (1966-02-05) female who presents with chief complaint of my catheter is not working well.  History of Present Illness:  I am asked to see the patient by Ms. Chantel Breeze for evaluation of a poorly functioning catheter.  Per the patient as well as nephrology services they are having increased pressures and poor dialysis runs.  Patient noted they only were able to achieve a flow of 175 yesterday's dialysis run.  The patient has a history of a failed left arm access.    Current access is via a catheter which is functioning poorly.  There have only been 2 previous catheter placements.  No history of amaurosis fugax or recent TIA symptoms. There are no recent neurological changes noted. No history of claudication symptoms or rest pain symptoms. T no history of DVT, PE or superficial thrombophlebitis.    Current Meds  Medication Sig   AURYXIA 1 GM 210 MG(Fe) tablet TAKE 2 TABLETS BY MOUTH WITH MEAL AND 1 TABLET WITH SNACK   blood glucose meter kit and supplies Dispense based on patient and insurance preference. Use up to four times daily as directed. (FOR ICD-10 E10.9, E11.9).   glucagon (GLUCAGON EMERGENCY) 1 MG injection Inject 1 mg into the vein once as needed for up to 1 dose.   glucose blood test strip One Touch Ultra, check fsbs 5 times daily   Lancets (ONETOUCH ULTRASOFT) lancets Use as instructed   losartan (COZAAR) 50 MG tablet Take 50 mg by mouth daily.    NOVOFINE 32G X 6 MM MISC 1 EACH BY DOES NOT APPLY ROUTE 3 (THREE) TIMES DAILY WITH MEALS.   NOVOLOG FLEXPEN 100 UNIT/ML FlexPen INJECT 10-22 UNITS INTO THE SKIN 3 (THREE) TIMES DAILY WITH MEALS.    Past Medical History:  Diagnosis Date   Anemia    vitamin d3 deficiency   Anxiety    Chronic kidney disease    End Stage Renal Disease   Diabetes mellitus without complication (HCC)    GERD (gastroesophageal reflux disease)    nothing over last few years   High serum  parathyroid hormone (PTH)    checked through Dialysis   History of kidney stones 2000   Hypertension    Neuromuscular disorder (HCC)    neuropathy in feet   PONV (postoperative nausea and vomiting)    severe nausea requiring many doses of post op antiemetics   Stroke ([place]) 10/2017   thinks she had a series of mini strokes.right leg up to right side of face were numb. no loss of consciousness    Past Surgical History:  Procedure Laterality Date   ABDOMINAL HYSTERECTOMY  2007   AV FISTULA INSERTION W/ RF MAGNETIC GUIDANCE N/A 12/08/2017   Procedure: AV FISTULA INSERTION W/RF MAGNETIC GUIDANCE;  Surgeon: Katha Cabal, MD;  Location: [facility];  Service: Cardiovascular;  Laterality: N/A;   HIP ARTHROPLASTY Left 09/29/2021   Procedure: ARTHROPLASTY BIPOLAR HIP (HEMIARTHROPLASTY);  Surgeon: Lovell Sheehan, MD;  Location: [facility];  Service: Orthopedics;  Laterality: Left;   ROTATOR CUFF REPAIR Right 2004   SHOULDER CLOSED REDUCTION Right 2004   UPPER EXTREMITY VENOGRAPHY Left 02/15/2018   Procedure: UPPER EXTREMITY VENOGRAPHY;  Surgeon: Katha Cabal, MD;  Location: [facility];  Service: Cardiovascular;  Laterality: Left;    Social History Social History   Tobacco Use   Smoking status: Never   Smokeless tobacco: Never  Vaping Use  Vaping Use: Never used  Substance Use Topics   Alcohol use: No   Drug use: Never    Family History Family History  Problem Relation Age of Onset   Emphysema Mother    COPD Mother    Cancer Father    Cancer Paternal Aunt    Diabetes Sister    Hypertension Sister    Eczema Sister    Diabetes Brother    Hypertension Brother    Cardiomyopathy Brother    Alcohol abuse Brother     Allergies  Allergen Reactions   Enalapril Hives and Other (See Comments)    Angioedema face.   Ivp Dye [Iodinated Contrast Media] Hives   Lisinopril Shortness Of Breath   Shellfish Allergy Swelling and Other (See Comments)    patient  tolerates shellfish by mouth without problem     REVIEW OF SYSTEMS (Negative unless checked)  Constitutional: []Weight loss  []Fever  []Chills Cardiac: []Chest pain   []Chest pressure   []Palpitations   []Shortness of breath when laying flat   []Shortness of breath with exertion. Vascular:  []Pain in legs with walking   []Pain in legs at rest  []History of DVT   []Phlebitis   []Swelling in legs   []Varicose veins   []Non-healing ulcers Pulmonary:   []Uses home oxygen   []Productive cough   []Hemoptysis   []Wheeze  []COPD   []Asthma Neurologic:  []Dizziness   []Seizures   []History of stroke   []History of TIA  []Aphasia   []Vissual changes   []Weakness or numbness in arm   []Weakness or numbness in leg Musculoskeletal:   []Joint swelling   []Joint pain   []Low back pain Hematologic:  []Easy bruising  []Easy bleeding   []Hypercoagulable state   []Anemic Gastrointestinal:  []Diarrhea   [x]Vomiting post operative  [x]Gastroesophageal reflux/heartburn   []Difficulty swallowing. Genitourinary:  [x]Chronic kidney disease   []Difficult urination  []Frequent urination   []Blood in urine Skin:  []Rashes   []Ulcers  Psychological:  [x]History of anxiety   [] History of major depression.  Physical Examination  Vitals:   10/02/21 1111 10/02/21 1157 10/02/21 1510 10/02/21 1559  BP: (!) 148/60 (!) 151/64 138/63 128/61  Pulse: 94 99 100 97  Resp: _0 Temp: 97.8 F (36.6 C) (!) 97.5 F (36.4 C) 98.3 F (36.8 C) 97.8 F (36.6 C)  TempSrc:  Oral Oral   SpO2: 97%  100% 98%  Weight:      Height:       Body mass index is 27.76 kg/m. Gen: WD/WN, NAD Head: [place]/AT, No temporalis wasting.  Ear/Nose/Throat: Hearing grossly intact, nares w/o erythema or drainage Eyes: PER, EOMI, sclera nonicteric.  Neck: Supple, no gross masses or lesions.  No JVD.  Pulmonary:  Good air movement, no audible wheezing, no use of accessory muscles.  Cardiac: RRR, precordium non-hyperdynamic. Vascular:   Right  IJ catheter is clean dry and intact it is nontender however it does appear to have pulled back at least 2 to 3 cm Vessel Right Left  Radial Palpable Palpable  Gastrointestinal: soft, non-distended. No guarding/no peritoneal signs.  Musculoskeletal: M/S 5/5 throughout.  No deformity.  Neurologic: CN 2-12 intact. Pain and light touch intact in extremities.  Symmetrical.  Speech is fluent. Motor exam as listed above. Psychiatric: Judgment intact, Mood & affect appropriate for pt's clinical situation. Dermatologic: No rashes or ulcers noted.  No changes consistent with cellulitis.   CBC Lab Results  Component Value  Date   WBC 7.3 10/02/2021   HGB 6.3 (L) 10/02/2021   HCT 19.5 (L) 10/02/2021   MCV 101.6 (H) 10/02/2021   PLT 169 10/02/2021    BMET    Component Value Date/Time   NA 133 (L) 10/02/2021 0417   K 6.1 (H) 10/02/2021 0417   CL 94 (L) 10/02/2021 0417   CO2 25 10/02/2021 0417   GLUCOSE 183 (H) 10/02/2021 0417   BUN 77 (H) 10/02/2021 0417   CREATININE 9.21 (H) 10/02/2021 0417   CALCIUM 9.0 10/02/2021 0417   GFRNONAA 5 (L) 10/02/2021 0417   GFRAA 8 (L) 12/06/2017 1505   Estimated Creatinine Clearance: 7.4 mL/min (A) (by C-G formula based on SCr of 9.21 mg/dL (H)).  COAG Lab Results  Component Value Date   INR 0.86 12/06/2017   INR 0.85 07/13/2017    Radiology CT Hip Left Wo Contrast  Result Date: 09/28/2021 CLINICAL DATA:  Fall, left hip fracture EXAM: CT OF THE LEFT HIP WITHOUT CONTRAST TECHNIQUE: Multidetector CT imaging of the left hip was performed according to the standard protocol. Multiplanar CT image reconstructions were also generated. RADIATION DOSE REDUCTION: This exam was performed according to the departmental dose-optimization program which includes automated exposure control, adjustment of the mA and/or kV according to patient size and/or use of iterative reconstruction technique. COMPARISON:  X-ray 09/28/2021, CT 06/09/2019 FINDINGS: Bones/Joint/Cartilage  Marked diffuse osteopenia. Acute transcervical fracture through the left femoral neck with significant varus angulation. No definite intertrochanteric involvement. Hip joint alignment is maintained without dislocation. Acute nondisplaced fracture of the right inferior pubic ramus (series 2, image 91). No additional fractures are identified. Moderate arthropathy of the pubic symphysis with new erosive or resorptive changes of the bilateral pubic bones. Symphysis is mildly widened compared to previous CT. Ligaments Suboptimally assessed by CT. Muscles and Tendons No acute musculotendinous abnormality by CT. Soft tissues Mild soft tissue swelling at the femoral neck fracture site. No organized hematoma. No acute findings within the visualized portion of the left hemipelvis. IMPRESSION: 1. Acute transcervical fracture through the LEFT femoral neck with varus angulation. 2. Acute nondisplaced fracture of the RIGHT inferior pubic ramus. 3. Moderate arthropathy of the pubic symphysis with new resorptive or erosive changes of the bilateral pubic bones. This may be secondary to underlying renal osteodystrophy. Septic arthritis could have this appearance in the appropriate clinical setting. Mild widening at the pubic symphysis is favored secondary to underlying arthropathy/resorption rather than posttraumatic diastasis. 4. Marked bony demineralization. Electronically Signed   By: Davina Poke D.O.   On: 09/28/2021 18:56   DG Chest Port 1 View  Result Date: 09/28/2021 CLINICAL DATA:  Fall EXAM: PORTABLE CHEST 1 VIEW COMPARISON:  None. FINDINGS: The cardiomediastinal silhouette is enlarged in contour.RIGHT chest CVC tip terminating over the superior cavoatrial junction. Enlarged appearance of bilateral hilar contours most consistent with enlarged pulmonary arteries and likely underlying pulmonary arterial hypertension. Elevation of the RIGHT hemidiaphragm. No pleural effusion. No pneumothorax. No acute pleuroparenchymal  abnormality. Visualized abdomen is unremarkable. Osteopenia. IMPRESSION: 1. Cardiomegaly with enlarged hilar contours likely reflecting enlarged pulmonary arteries and underlying pulmonary arterial hypertension. Electronically Signed   By: Valentino Saxon M.D.   On: 09/28/2021 17:05   DG C-Arm 1-60 Min-No Report  Result Date: 09/29/2021 Fluoroscopy was utilized by the requesting physician.  No radiographic interpretation.   DG HIP UNILAT WITH PELVIS 1V LEFT  Result Date: 09/29/2021 CLINICAL DATA:  Left hip arthroplasty, fracture left femur EXAM: DG HIP (WITH OR WITHOUT PELVIS) 1V*L*  COMPARISON:  09/28/2021 FINDINGS: Fluoroscopic images show interval left hip arthroplasty. Fluoroscopic time was 6 seconds. Radiation dose is 0.67 mGy. IMPRESSION: Fluoroscopic assistance was provided for left hip arthroplasty. Electronically Signed   By: Elmer Picker M.D.   On: 09/29/2021 17:02   DG HIP UNILAT WITH PELVIS 2-3 VIEWS LEFT  Result Date: 10/02/2021 CLINICAL DATA:  Left hip pain. EXAM: DG HIP (WITH OR WITHOUT PELVIS) 2-3V LEFT COMPARISON:  Left hip fluoroscopy 09/29/2021 FINDINGS: Interval total left hip arthroplasty. No perihardware lucency is seen to indicate hardware failure or loosening. Mild-to-moderate right femoroacetabular joint space narrowing. There is diffuse decreased bone mineralization. Expected postoperative changes of the left hip including lateral subcutaneous air and lateral surgical skin staples. IMPRESSION: Interval total left hip arthroplasty without evidence of hardware failure. Electronically Signed   By: Yvonne Kendall M.D.   On: 10/02/2021 13:08   DG Hip Unilat W or Wo Pelvis 2-3 Views Left  Result Date: 09/28/2021 CLINICAL DATA:  Fall EXAM: DG HIP (WITH OR WITHOUT PELVIS) 2-3V LEFT COMPARISON:  None. FINDINGS: Osteopenia. There is a displaced fracture through the neck and intratrochanteric LEFT femur with superior translocation of the distal femur. Femoral head appears to be  seated within the acetabulum although evaluation is limited by body habitus. No additional fracture noted. Limited assessment of the sacrum secondary to overlapping bowel contents and profound osteopenia. IMPRESSION: Displaced foreshortened fracture of the neck and inter trochanteric LEFT femur. Electronically Signed   By: Valentino Saxon M.D.   On: 09/28/2021 17:04     Assessment/Plan  Complication vascular access: I will plan for a catheter exchange under moderate sedation tomorrow.  Risk and benefits of been reviewed all questions have been answered patient agrees to proceed.  She is familiar with this procedure she has had her original catheter placed once in the past.  2.  End-stage renal disease: At the present time the patient does not have adequate dialysis access. Arrangements will be made to correct this as noted above.  Continue hemodialysis as ordered without interruption.  Avoid nephrotoxic medications and dehydration.  Further plans per nephrology   3.  Diabetes mellitus: Continue hypoglycemic medications as already ordered, these medications have been reviewed and there are no changes at this time.  Hgb A1C to be monitored as already arranged by primary service   4.  Hypertension: Continue antihypertensive medications as already ordered, these medications have been reviewed and there are no changes at this time.   5.  Hypercholesterolemia: Continue statin as ordered and reviewed, no changes at this time    [name], MD  10/02/2021 6:57 PM

## 2021-10-02 NOTE — Progress Notes (Signed)
[facility]  ROUNDING NOTE   Subjective:   Patient seen sitting at side of bed, eating breakfast Alert and oriented Denies shortness of breath, remains on 2 L [place] Denies nausea and vomiting Eager to work with PT/OT  Discussed plan to have PermCath exchanged tomorrow Hemoglobin 6.3   Objective:  Vital signs in last 24 hours:  Temp:  [98.2 F (36.8 C)-98.9 F (37.2 C)] 98.3 F (36.8 C) (02/02 0805) Pulse Rate:  [96-108] 96 (02/02 0805) Resp:  [14-26] 15 (02/02 0805) BP: (119-136)/(49-75) 132/56 (02/02 0805) SpO2:  [92 %-100 %] 99 % (02/02 0805) Weight:  [79.2 kg-80.4 kg] 80.4 kg (02/01 1200)  Weight change:  Filed Weights   09/29/21 1845 10/01/21 1043 10/01/21 1200  Weight: 85.7 kg 79.2 kg 80.4 kg    Intake/Output: I/O last 3 completed shifts: In: 120 [P.O.:120] Out: -57    Intake/Output this shift:  No intake/output data recorded.  Physical Exam: General: NAD  Head: Normocephalic, atraumatic. Moist oral mucosal membranes  Eyes: Anicteric  Lungs:  Clear to auscultation, normal effort  Heart: S1S2 no rubs  Abdomen:  Soft, nontender, bowel sounds present  Extremities: trace peripheral edema.  Neurologic: Awake, alert, following commands  Skin: No acute rash, left hip surgical dressing  Access: IJ PermCath    Basic Metabolic Panel: Recent Labs  Lab 09/28/21 1556 09/29/21 0630 09/30/21 0645 10/02/21 0417  NA 137 135 133* 133*  K 4.5 5.3* 5.6* 6.1*  CL 94* 93* 94* 94*  CO2 27 25 25 25   GLUCOSE 121* 130* 162* 183*  BUN 59* 69* 57* 77*  CREATININE 7.75* 8.78* 7.30* 9.21*  CALCIUM 10.0 9.4 8.4* 9.0  MG  --  2.4  --   --   PHOS  --  6.3*  --  6.0*     Liver Function Tests: Recent Labs  Lab 10/02/21 0417  ALBUMIN 2.9*   No results for input(s): LIPASE, AMYLASE in the last 168 hours. No results for input(s): AMMONIA in the last 168 hours.  CBC: Recent Labs  Lab 09/28/21 1556 09/29/21 0630 09/30/21 0645 10/01/21 0329  10/02/21 0417  WBC 4.6 5.4 9.1 8.7 7.3  NEUTROABS 3.4  --   --   --  5.6  HGB 10.0* 9.9* 7.7* 6.7* 6.3*  HCT 31.3* 30.2* 23.7* 20.6* 19.5*  MCV 101.3* 97.4 100.4* 100.0 101.6*  PLT 168 158 189 167 169     Cardiac Enzymes: No results for input(s): CKTOTAL, CKMB, CKMBINDEX, TROPONINI in the last 168 hours.  BNP: Invalid input(s): POCBNP  CBG: Recent Labs  Lab 10/01/21 0857 10/01/21 1228 10/01/21 1632 10/01/21 2213 10/02/21 0805  GLUCAP 173* 188* 283* 177* 167*     Microbiology: Results for orders placed or performed during the hospital encounter of 09/28/21  Resp Panel by RT-PCR (Flu A&B, Covid) Nasopharyngeal Swab     Status: None   Collection Time: 09/28/21  3:50 PM   Specimen: Nasopharyngeal Swab; Nasopharyngeal(NP) swabs in vial transport medium  Result Value Ref Range Status   SARS Coronavirus 2 by RT PCR NEGATIVE NEGATIVE Final    Comment: (NOTE) SARS-CoV-2 target nucleic acids are NOT DETECTED.  The SARS-CoV-2 RNA is generally detectable in upper respiratory specimens during the acute phase of infection. The lowest concentration of SARS-CoV-2 viral copies this assay can detect is 138 copies/mL. A negative result does not preclude SARS-Cov-2 infection and should not be used as the sole basis for treatment or other patient management decisions. A negative result may  occur with  improper specimen collection/handling, submission of specimen other than nasopharyngeal swab, presence of viral mutation(s) within the areas targeted by this assay, and inadequate number of viral copies(<138 copies/mL). A negative result must be combined with clinical observations, patient history, and epidemiological information. The expected result is Negative.  Fact Sheet for Patients:  EntrepreneurPulse.com.au  Fact Sheet for Healthcare Providers:  IncredibleEmployment.be  This test is no t yet approved or cleared by the [place] FDA and   has been authorized for detection and/or diagnosis of SARS-CoV-2 by FDA under an Emergency Use Authorization (EUA). This EUA will remain  in effect (meaning this test can be used) for the duration of the COVID-19 declaration under Section 564(b)(1) of the Act, 21 U.S.C.section 360bbb-3(b)(1), unless the authorization is terminated  or revoked sooner.       Influenza A by PCR NEGATIVE NEGATIVE Final   Influenza B by PCR NEGATIVE NEGATIVE Final    Comment: (NOTE) The Xpert Xpress SARS-CoV-2/FLU/RSV plus assay is intended as an aid in the diagnosis of influenza from Nasopharyngeal swab specimens and should not be used as a sole basis for treatment. Nasal washings and aspirates are unacceptable for Xpert Xpress SARS-CoV-2/FLU/RSV testing.  Fact Sheet for Patients: EntrepreneurPulse.com.au  Fact Sheet for Healthcare Providers: IncredibleEmployment.be  This test is not yet approved or cleared by the [place] FDA and has been authorized for detection and/or diagnosis of SARS-CoV-2 by FDA under an Emergency Use Authorization (EUA). This EUA will remain in effect (meaning this test can be used) for the duration of the COVID-19 declaration under Section 564(b)(1) of the Act, 21 U.S.C. section 360bbb-3(b)(1), unless the authorization is terminated or revoked.  Performed at [facility], [address] [place], [place] [address]   Surgical PCR screen     Status: None   Collection Time: 09/29/21  8:13 AM   Specimen: Nasal Mucosa; Nasal Swab  Result Value Ref Range Status   MRSA, PCR NEGATIVE NEGATIVE Final   Staphylococcus aureus NEGATIVE NEGATIVE Final    Comment: (NOTE) The Xpert SA Assay (FDA approved for NASAL specimens in patients 54 years of age and older), is one component of a comprehensive surveillance program. It is not intended to diagnose infection nor to guide or monitor treatment. Performed at [facility],  [address] [place], [place] [address]     Coagulation Studies: No results for input(s): LABPROT, INR in the last 72 hours.  Urinalysis: No results for input(s): COLORURINE, LABSPEC, PHURINE, GLUCOSEU, HGBUR, BILIRUBINUR, KETONESUR, PROTEINUR, UROBILINOGEN, NITRITE, LEUKOCYTESUR in the last 72 hours.  Invalid input(s): APPERANCEUR    Imaging: No results found.   Medications:    lactated ringers 10 mL/hr at 09/30/21 0600    sodium chloride   Intravenous Once   acetaminophen  650 mg Oral Once   Chlorhexidine Gluconate Cloth  6 each Topical Daily   dextrose  1 ampule Intravenous Once   dextrose  1 ampule Intravenous Once   diclofenac  1 patch Transdermal BID   diphenhydrAMINE  25 mg Oral Once   docusate sodium  100 mg Oral BID   feeding supplement (NEPRO CARB STEADY)  237 mL Oral BID BM   ferric citrate  420 mg Oral TID WC   And   ferric citrate  210 mg Oral With snacks   furosemide  20 mg Intravenous Once   insulin aspart  0-5 Units Subcutaneous QHS   insulin aspart  0-6 Units Subcutaneous TID WC   insulin  aspart  10 Units Intravenous Once   losartan  50 mg Oral Daily   magnesium oxide  200 mg Oral BID   methocarbamol  750 mg Oral QID   multivitamin  1 tablet Oral QHS   alum & mag hydroxide-simeth, bisacodyl, hydrALAZINE, HYDROcodone-acetaminophen, labetalol, magnesium hydroxide, melatonin, menthol-cetylpyridinium **OR** phenol, metoCLOPramide **OR** metoCLOPramide (REGLAN) injection, ondansetron **OR** ondansetron (ZOFRAN) IV  Assessment/ Plan:  56 y.o. female with past medical history of ESRD on HD undergoing training for HHD, anemia of chronic kidney disease, severe secondary hyperparathyroidism, hypertension, diabetes mellitus type 1, peripheral neuropathy, hyperlipidemia who presents now with left hip fracture.  [name]/MWF/right PermCath/81kg  1.  ESRD with hyperkalemia on dialysis.  She was undergoing treatment training for home hemodialysis.   Training on hold due to fractured hip.  Attempted dialysis yesterday via right PermCath.  Treatment terminated early due to malfunctioning catheter and decreased blood flow rate.  Was unable to provide ordered blood transfusion due to these factors.  Patient voiced concerns of blood transfusion and preferred IV iron, ordered by this team and patient received yesterday.  Consulted vascular for PermCath exchange, this is scheduled for tomorrow.  Plan to dialyze patient after exchange.  Potassium 6.1 today, will prescribe Veltassa 25.2g and Lokelma 10 mg to manage   2.  Anemia of chronic kidney disease.  Hemoglobin remains decreased to 6.3 today.  Patient hesitant to receive blood transfusion yesterday and opted for IV iron transfusion.  Hemoglobin remains decreased.  Patient agreeable to 1 unit blood transfusion today.  3.  Secondary hyperparathyroidism.  Evaluation for parathyroidectomy schedule outpatient.    LOS: [place] 2/2/20239:59 AM

## 2021-10-02 NOTE — H&P (Signed)
Physical Medicine and Rehabilitation Admission H&P    Chief Complaint  Patient presents with   Hip Pain  : HPI: [name] is a 56 year old right-handed female with history of end-stage renal disease with hemodialysis via right IJ, diabetes mellitus with neuropathy, acute on chronic anemia, hyperlipidemia, obesity with BMI 27.76, secondary hyperparathyroidism of renal origin and currently awaiting outpatient scheduling for evaluation of parathyroidectomy, hypertension.  Per chart review patient lives with spouse.  1 level home 5 steps to entry.  Modified independent with Rollator for household and community ambulation and bilateral axillary crutches for stair negotiation.  Presented to [facility] 09/28/2021 with complaints of severe left hip pain.  Patient states her leg gave out and she heard a pop on the left hip.  She also had complaints of right side hip pain from a fall.  X-rays and imaging revealed displaced foreshortened fracture of the neck and intertrochanteric left femur as well as acute nondisplaced fracture of the right inferior pubic ramus.  Patient underwent left hip anterior hip hemiarthroplasty 09/29/2021 per Dr. Kurtis Bushman.  Conservative care of right inferior pubic ramus fracture.  Patient is currently weightbearing as tolerated left lower extremity as well as weightbearing as tolerated right lower extremity.  Hospital course hemodialysis ongoing vascular surgery Dr Rica Koyanagi consulted to evaluate for exchange PermCath from right IJ to better dialysis 10/03/2021.  Hospital course acute on chronic anemia latest hemoglobin 6.7 of which she was transfused and follow-up hemoglobin 7.7 and patient also remains on iron supplement.  Therapy evaluations completed due to patient decreased functional mobility was admitted for a comprehensive rehab program  Review of Systems  Constitutional:  Negative for chills and fever.  HENT:  Negative for hearing loss.   Eyes:  Negative for blurred  vision and double vision.  Respiratory:  Negative for cough and shortness of breath.   Cardiovascular:  Negative for chest pain, palpitations and leg swelling.  Gastrointestinal:  Positive for constipation. Negative for heartburn, nausea and vomiting.       GERD  Genitourinary:  Negative for dysuria, flank pain and hematuria.  Musculoskeletal:  Positive for joint pain and myalgias.  Skin:  Negative for rash.  Neurological:  Positive for weakness.  Psychiatric/Behavioral:         Anxiety  All other systems reviewed and are negative. Past Medical History:  Diagnosis Date   Anemia    vitamin d3 deficiency   Anxiety    Chronic kidney disease    End Stage Renal Disease   Diabetes mellitus without complication (HCC)    GERD (gastroesophageal reflux disease)    nothing over last few years   High serum parathyroid hormone (PTH)    checked through Dialysis   History of kidney stones 2000   Hypertension    Neuromuscular disorder (HCC)    neuropathy in feet   PONV (postoperative nausea and vomiting)    severe nausea requiring many doses of post op antiemetics   Stroke ([place]) 10/2017   thinks she had a series of mini strokes.right leg up to right side of face were numb. no loss of consciousness   Past Surgical History:  Procedure Laterality Date   ABDOMINAL HYSTERECTOMY  2007   AV FISTULA INSERTION W/ RF MAGNETIC GUIDANCE N/A 12/08/2017   Procedure: AV FISTULA INSERTION W/RF MAGNETIC GUIDANCE;  Surgeon: Katha Cabal, MD;  Location: [facility];  Service: Cardiovascular;  Laterality: N/A;   DIALYSIS/PERMA CATHETER INSERTION Right 10/03/2021   Procedure: DIALYSIS/PERMA CATHETER  INSERTION;  Surgeon: Katha Cabal, MD;  Location: [facility];  Service: Cardiovascular;  Laterality: Right;   HIP ARTHROPLASTY Left 09/29/2021   Procedure: ARTHROPLASTY BIPOLAR HIP (HEMIARTHROPLASTY);  Surgeon: Lovell Sheehan, MD;  Location: [facility];  Service: Orthopedics;  Laterality:  Left;   ROTATOR CUFF REPAIR Right 2004   SHOULDER CLOSED REDUCTION Right 2004   UPPER EXTREMITY VENOGRAPHY Left 02/15/2018   Procedure: UPPER EXTREMITY VENOGRAPHY;  Surgeon: Katha Cabal, MD;  Location: [facility];  Service: Cardiovascular;  Laterality: Left;   Family History  Problem Relation Age of Onset   Emphysema Mother    COPD Mother    Cancer Father    Cancer Paternal Aunt    Diabetes Sister    Hypertension Sister    Eczema Sister    Diabetes Brother    Hypertension Brother    Cardiomyopathy Brother    Alcohol abuse Brother    Social History:  reports that she has never smoked. She has never used smokeless tobacco. She reports that she does not drink alcohol and does not use drugs. Allergies:  Allergies  Allergen Reactions   Enalapril Hives and Other (See Comments)    Angioedema face.   Ivp Dye [Iodinated Contrast Media] Hives   Lisinopril Shortness Of Breath   Shellfish Allergy Swelling and Other (See Comments)    patient tolerates shellfish by mouth without problem   Medications Prior to Admission  Medication Sig Dispense Refill   AURYXIA 1 GM 210 MG(Fe) tablet TAKE 2 TABLETS BY MOUTH WITH MEAL AND 1 TABLET WITH SNACK     blood glucose meter kit and supplies Dispense based on patient and insurance preference. Use up to four times daily as directed. (FOR ICD-10 E10.9, E11.9). 1 each 0   glucagon (GLUCAGON EMERGENCY) 1 MG injection Inject 1 mg into the vein once as needed for up to 1 dose. 1 each 2   glucose blood test strip One Touch Ultra, check fsbs 5 times daily 200 each 2   Lancets (ONETOUCH ULTRASOFT) lancets Use as instructed 200 each 2   losartan (COZAAR) 50 MG tablet Take 50 mg by mouth daily.      NOVOFINE 32G X 6 MM MISC 1 EACH BY DOES NOT APPLY ROUTE 3 (THREE) TIMES DAILY WITH MEALS. 100 each 0   NOVOLOG FLEXPEN 100 UNIT/ML FlexPen INJECT 10-22 UNITS INTO THE SKIN 3 (THREE) TIMES DAILY WITH MEALS. 15 mL 0   NON FORMULARY as needed (oils used  via inhaling and diffuser).       Drug Regimen Review Drug regimen was reviewed and remains appropriate with no significant issues identified  Home: Home Living Family/patient expects to be discharged to:: Inpatient rehab Living Arrangements: Spouse/significant other Available Help at Discharge: Family, Available PRN/intermittently Type of Home: House Home Access: Stairs to enter [employer] of Steps: 5 Entrance Stairs-Rails: Left, Right Home Layout: One level Bathroom Shower/Tub: Chiropodist: Standard Bathroom Accessibility: Yes Home Equipment: Crutches, Rollator (4 wheels)   Functional History: Prior Function Prior Level of Function : Independent/Modified Independent Mobility Comments: Mod indep with rollator for household/community mobilization; bilat axillary crutches for stair negotiation.  Drives self to/from dialysis. Denies fall history outside of this event  Functional Status:  Mobility: Bed Mobility Overal bed mobility: Needs Assistance Bed Mobility: Supine to Sit Supine to sit: Min guard Sit to supine: Mod assist, +2 for physical assistance General bed mobility comments: increased time but with leg lifter/gait belt she  is able to manage on her own Transfers Overall transfer level: Needs assistance Equipment used: Rolling walker (2 wheels) Transfers: Sit to/from Stand Sit to Stand: Mod assist, +2 physical assistance Bed to/from chair/wheelchair/BSC transfer type:: Step pivot Step pivot transfers: Mod assist, Max assist, +2 physical assistance  Lateral/Scoot Transfers: Mod assist Transfer via Lift Equipment:  (hoyer) General transfer comment: able to transfert to/from commode with heavy assist and cues with physical assist to move LLE Ambulation/Gait Ambulation/Gait assistance: Mod assist, Max assist Gait Distance (Feet): 3 Feet Assistive device: Rolling walker (2 wheels) Gait Pattern/deviations: Step-to pattern General Gait  Details: unsafe to progress further than transfers Gait velocity: decreased    ADL: ADL Overall ADL's : Needs assistance/impaired General ADL Comments: MIN A x2 for ADL t/f  Cognition: Cognition Overall Cognitive Status: Within Functional Limits for tasks assessed Orientation Level: Oriented X4 Cognition Arousal/Alertness: Awake/alert Behavior During Therapy: Anxious, WFL for tasks assessed/performed Overall Cognitive Status: Within Functional Limits for tasks assessed General Comments: Pt is less anxious and more motivated today.  Physical Exam: Blood pressure (!) 145/66, pulse (!) 101, temperature 98.3 F (36.8 C), temperature source Oral, resp. rate 20, height $RemoveBe'5\' 7"'vnETfvBIb$  (1.702 m), weight 79.2 kg, SpO2 98 %. Physical Exam Constitutional:      General: She is not in acute distress.    Appearance: She is obese.  HENT:     Head: Normocephalic.     Right Ear: External ear normal.     Left Ear: External ear normal.     Nose: Nose normal.     Mouth/Throat:     Mouth: Mucous membranes are moist.     Pharynx: Oropharynx is clear.  Eyes:     General: No scleral icterus.    Extraocular Movements: Extraocular movements intact.     Pupils: Pupils are equal, round, and reactive to light.  Cardiovascular:     Rate and Rhythm: Regular rhythm. Tachycardia present.     Heart sounds: No murmur heard.   No gallop.  Pulmonary:     Effort: Pulmonary effort is normal. No respiratory distress.     Breath sounds: No wheezing.  Abdominal:     General: Bowel sounds are normal. There is no distension.     Palpations: Abdomen is soft. There is no mass.  Musculoskeletal:        General: Swelling and tenderness present.     Cervical back: Normal range of motion and neck supple.     Right lower leg: Edema present.     Left lower leg: Edema present.     Comments: Left hip tender to palpation and PROM with associated swelling. Left knee with mild effusion, patella laxity. Right hip tender with leg  raise  Skin:    Comments: Incision site dressing clean dry and intact.   Neurological:     Comments: Patient is alert.  No acute distress.  Oriented x3 and follows commands. Alert and oriented x 3. Normal insight and awareness. Intact Memory. Normal language and speech. Cranial nerve exam unremarkable. UE motor 5/5. LE motor limited proximally by pain. ADF/PF 4-5/5. No sensory findings.   Psychiatric:     Comments: Pleasant, slightly anxious    Results for orders placed or performed during the hospital encounter of 09/28/21 (from the past 48 hour(s))  Glucose, capillary     Status: Abnormal   Collection Time: 10/04/21 12:55 PM  Result Value Ref Range   Glucose-Capillary 250 (H) 70 - 99 mg/dL    Comment: Glucose  reference range applies only to samples taken after fasting for at least 8 hours.   Comment 1 Notify RN    Comment 2 Document in Chart   Glucose, capillary     Status: Abnormal   Collection Time: 10/04/21  4:03 PM  Result Value Ref Range   Glucose-Capillary 160 (H) 70 - 99 mg/dL    Comment: Glucose reference range applies only to samples taken after fasting for at least 8 hours.   Comment 1 Notify RN    Comment 2 Document in Chart   Glucose, capillary     Status: Abnormal   Collection Time: 10/04/21  9:41 PM  Result Value Ref Range   Glucose-Capillary 193 (H) 70 - 99 mg/dL    Comment: Glucose reference range applies only to samples taken after fasting for at least 8 hours.  Glucose, capillary     Status: Abnormal   Collection Time: 10/05/21  7:38 AM  Result Value Ref Range   Glucose-Capillary 158 (H) 70 - 99 mg/dL    Comment: Glucose reference range applies only to samples taken after fasting for at least 8 hours.  Glucose, capillary     Status: Abnormal   Collection Time: 10/05/21 11:13 AM  Result Value Ref Range   Glucose-Capillary 211 (H) 70 - 99 mg/dL    Comment: Glucose reference range applies only to samples taken after fasting for at least 8 hours.  Glucose,  capillary     Status: Abnormal   Collection Time: 10/05/21  4:55 PM  Result Value Ref Range   Glucose-Capillary 146 (H) 70 - 99 mg/dL    Comment: Glucose reference range applies only to samples taken after fasting for at least 8 hours.  Renal function panel     Status: Abnormal   Collection Time: 10/06/21  4:45 AM  Result Value Ref Range   Sodium 131 (L) 135 - 145 mmol/L   Potassium 5.1 3.5 - 5.1 mmol/L   Chloride 94 (L) 98 - 111 mmol/L   CO2 24 22 - 32 mmol/L   Glucose, Bld 172 (H) 70 - 99 mg/dL    Comment: Glucose reference range applies only to samples taken after fasting for at least 8 hours.   BUN 71 (H) 6 - 20 mg/dL   Creatinine, Ser 9.02 (H) 0.44 - 1.00 mg/dL    Comment: RESULTS VERIFIED BY REPEAT TESTING GA   Calcium 9.1 8.9 - 10.3 mg/dL   Phosphorus 5.9 (H) 2.5 - 4.6 mg/dL   Albumin 2.9 (L) 3.5 - 5.0 g/dL   GFR, Estimated 5 (L) >60 mL/min    Comment: (NOTE) Calculated using the CKD-EPI Creatinine Equation (2021)    Anion gap 13 5 - 15    Comment: Performed at [facility], [address] [place], [place] [address]  CBC with Differential/Platelet     Status: Abnormal   Collection Time: 10/06/21  4:45 AM  Result Value Ref Range   WBC 6.3 4.0 - 10.5 K/uL   RBC 2.46 (L) 3.87 - 5.11 MIL/uL   Hemoglobin 7.7 (L) 12.0 - 15.0 g/dL   HCT 23.5 (L) 36.0 - 46.0 %   MCV 95.5 80.0 - 100.0 fL   MCH 31.3 26.0 - 34.0 pg   MCHC 32.8 30.0 - 36.0 g/dL   RDW 15.3 11.5 - 15.5 %   Platelets 188 150 - 400 K/uL   nRBC 0.0 0.0 - 0.2 %   Neutrophils Relative % 78 %   Neutro Abs 5.0 1.7 -  7.7 K/uL   Lymphocytes Relative 8 %   Lymphs Abs 0.5 (L) 0.7 - 4.0 K/uL   Monocytes Relative 9 %   Monocytes Absolute 0.6 0.1 - 1.0 K/uL   Eosinophils Relative 3 %   Eosinophils Absolute 0.2 0.0 - 0.5 K/uL   Basophils Relative 1 %   Basophils Absolute 0.0 0.0 - 0.1 K/uL   Immature Granulocytes 1 %   Abs Immature Granulocytes 0.05 0.00 - 0.07 K/uL    Comment: Performed at [facility], [address] [place], [place] [address]  Glucose, capillary     Status: Abnormal   Collection Time: 10/06/21  7:42 AM  Result Value Ref Range   Glucose-Capillary 163 (H) 70 - 99 mg/dL    Comment: Glucose reference range applies only to samples taken after fasting for at least 8 hours.   No results found.     Medical Problem List and Plan: 1. Functional deficits secondary to nondisplaced fracture right inferior pubic ramus as well as displaced foreshortened fracture of the neck left femur.  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  Weightbearing as tolerated with anterior total hip precautions.  Conservative care right inferior pubic ramus fracture weightbearing as tolerated  -patient may  shower  -ELOS/Goals: 10-14 days, supervision to min assist goals 2.  Antithrombotics: -DVT/anticoagulation:  Mechanical: Antiembolism stockings, thigh (TED hose) Bilateral lower extremities check vascular study.  Will discuss plan for Lovenox after permacath placed  -antiplatelet therapy: N/A 3. Pain Management: Flector patch,Robaxin 750 mg 4 times daily, hydrocodone as needed  -pt having significant spasms in left thigh.  -add kpad to help with spasms 4. Mood: Provide emotional support.  Melatonin as needed  -antipsychotic agents: N/A 5. Neuropsych: This patient is capable of making decisions on her own behalf. 6. Skin/Wound Care: Routine skin checks 7. Fluids/Electrolytes/Nutrition: Routine in and outs with follow-up chemistries 8.  End-stage renal disease/hemodialysis.  Permacath exchanged 10/03/2021.    -  Follow-up hemodialysis per Dr. [name] -HD later in day to allow participation in therapies during the day 9.  Acute on chronic anemia.  Has had transfusions already x 2. Hgb has stabilized in 7 range..  Continue iron supplement.   -epo per nephrology 10.  Diabetes mellitus with peripheral neuropathy.  Hemoglobin A1c 6.2.  SSI as prior to admission. 11.  Hypertension.   Cozaar 50 mg daily, 12.  Hyperparathyroidism of renal origin.  Plan outpatient parathyroidectomy per Dr.Lateef 13.  Obesity.  BMI 27.76.  Dietary follow-up 14. Cushingoid?--outpt work up   [employer], PA-C 10/06/2021

## 2021-10-02 NOTE — Progress Notes (Signed)
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   Vitals in the emergency department showed temperature of 97.8, respiration rate of 18, heart rate of 99, blood pressure 186/92, SPO2 of 99% on room air. Labs in the emergency department showed serum sodium 137, potassium 4.5, chloride 94, bicarb 27, BUN of 59, serum creatinine of seven 7.75, nonfasting blood glucose 121, GFR of 6. WBC was 4.6, hemoglobin 10, platelets of 168 COVID/influenza A/influenza B PCR were negative.   ED provider ordered a left unilateral with or without pelvis x-ray and portable chest x-ray. Unilateral hip x-ray showed left femur fracture. Follow-up CT redemonstrated left femur fracture at the femoral neck and associated right pelvic fracture Orthopedics and nephrology on consult Status post left hip anterior hemiarthroplasty on 1/30  Assessment & Plan:   Principal Problem:   Femur fracture, left (HCC) Active Problems:   Dyslipidemia   Secondary hyperparathyroidism of renal origin ([place])   Diabetic neuropathy ([place])   End stage renal disease on dialysis ([place])   Type 1 diabetes mellitus with chronic kidney disease on chronic dialysis [facility])   Essential hypertension  Femur fracture, left (HCC) -Status post left hip hemiarthroplasty 1/30 Tylenol first choice, Norco 1 every 4 as needed second choice  Is on TED hose Therapy evaluations recommending acute inpatient rehab--Insurance approval pending Discharge planning per TOC-awaiting authorizations-family understands that backup plan to skilled facility placement End stage  renal disease on dialysis ([place]) Hyperkalemia Nonfunctional PERM-Cath - Currently via right IJ - MWTF for 2 hours each day as patient is currently being trained to get home hemodialysis -Status post hemodialysis after operating room on 1/30 -need PERM cath repair 10/03/21--Vasc already aware Anemia of acute blood loss postop expected in the setting of ESRD but recent surgery -Patient having access issues -IV iron given 2/1 Tx 1 U PRBC 10/02/21 Essential hypertension - Amlodipine held, continuing losartan 50 mg daily - Labetalol injection 5 mg every 2 hours as needed for SBP greater than 170, 4 doses ordered Type 1 diabetes mellitus with chronic kidney disease on chronic dialysis [facility]) - Per endocrinology OV 2022, patient takes glargine 15 units subcutaneous nightly, however she states that her insurance does not pay for this insulin - Insulin SSI with at bedtime coverage ordered, end-stage renal disease dosing -CBGs ranging 177-190 DM coordinator to see regarding her continuous monitor and re-start it  ?  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 Holley Raring [ who has been coordinating her Parathyroid surgery for 10/07/2021] will hopefully be able to help with care coordination  DVT prophylaxis: SCD Code Status: Full Family Communication:Husband at bedside 1/30 sitting in chair-looks somewhat comfortable-states that her ribs hurt Disposition Plan: Status is: Inpatient  Remains inpatient appropriate because: Hip fracture.  Postop day 1 status post hemiarthroplasty     Level of care: Med-Surg  Consultants:  Orthopedics Nephrology  Procedures:  Hip fracture repair 1/30  Antimicrobials: None   Subjective:  Pain in ribs Scooted to edge of bed No other issue HIp pain is moderate Interested in Solicitor Long discussion about ? Cushingoid--I think we will wait till as  OP she follows with a new Endocrinologist  Objective: Vitals:   10/01/21 1631 10/01/21 1938 10/02/21 0326 10/02/21 0805  BP: (!) 121/49 (!) 136/56 (!) 130/53 (!) 132/56  Pulse: (!) 108 (!) 107 (!) 101 96  Resp: 18 16 14 15   Temp: 98.5 F (36.9 C) 98.6 F (37 C) 98.2 F (36.8 C) 98.3 F (36.8 C)  TempSrc:      SpO2: 93% 92% 96% 99%  Weight:      Height:        Intake/Output Summary (Last 24 hours) at 10/02/2021 1001 Last data filed at 10/01/2021 1418 Gross per 24 hour  Intake 120 ml  Output -57 ml  Net 177 ml    Filed Weights   09/29/21 1845 10/01/21 1043 10/01/21 1200  Weight: 85.7 kg 79.2 kg 80.4 kg    Examination:  EOMI NCAT cushingoid appearance-female pattern baldness  proptosis eyes No thyromegaly Mallampatti 3, poor dentition Neck soft webbed S1-S2 no murmur Chest clear no added sound no rales no rhonchi Slight left lower extremity swelling   Data Reviewed: I have personally reviewed following labs and imaging studies  CBC: Recent Labs  Lab 09/28/21 1556 09/29/21 0630 09/30/21 0645 10/01/21 0329 10/02/21 0417  WBC 4.6 5.4 9.1 8.7 7.3  NEUTROABS 3.4  --   --   --  5.6  HGB 10.0* 9.9* 7.7* 6.7* 6.3*  HCT 31.3* 30.2* 23.7* 20.6* 19.5*  MCV 101.3* 97.4 100.4* 100.0 101.6*  PLT 168 158 189 167 086   Basic Metabolic Panel: Recent Labs  Lab 09/28/21 1556 09/29/21 0630 09/30/21 0645 10/02/21 0417  NA 137 135 133* 133*  K 4.5 5.3* 5.6* 6.1*  CL 94* 93* 94* 94*  CO2 27 25 25 25   GLUCOSE 121* 130* 162* 183*  BUN 59* 69* 57* 77*  CREATININE 7.75* 8.78* 7.30* 9.21*  CALCIUM 10.0 9.4 8.4* 9.0  MG  --  2.4  --   --   PHOS  --  6.3*  --  6.0*   GFR: Estimated Creatinine Clearance: 7.4 mL/min (A) (by C-G formula based on SCr of 9.21 mg/dL (H)). Liver Function Tests: Recent Labs  Lab 10/02/21 0417  ALBUMIN 2.9*   No results for input(s): LIPASE, AMYLASE in the last 168 hours. No results for input(s): AMMONIA in the last 168 hours. Coagulation  Profile: No results for input(s): INR, PROTIME in the last 168 hours. Cardiac Enzymes: No results for input(s): CKTOTAL, CKMB, CKMBINDEX, TROPONINI in the last 168 hours. BNP (last 3 results) No results for input(s): PROBNP in the last 8760 hours. HbA1C: No results for input(s): HGBA1C in the last 72 hours.  CBG: Recent Labs  Lab 10/01/21 0857 10/01/21 1228 10/01/21 1632 10/01/21 2213 10/02/21 0805  GLUCAP 173* 188* 283* 177* 167*    Lipid Profile: No results for input(s): CHOL, HDL, LDLCALC, TRIG, CHOLHDL, LDLDIRECT in the last 72 hours. Thyroid Function Tests: No results for input(s): TSH, T4TOTAL, FREET4, T3FREE, THYROIDAB in the last 72 hours.  Anemia Panel: No results for input(s): VITAMINB12, FOLATE, FERRITIN, TIBC, IRON, RETICCTPCT in the last 72 hours.  Sepsis Labs: No results for input(s): PROCALCITON, LATICACIDVEN in the last 168 hours.  Recent Results (from the past 240 hour(s))  Resp Panel by RT-PCR (Flu A&B, Covid) Nasopharyngeal Swab     Status: None   Collection Time: 09/28/21  3:50 PM   Specimen: Nasopharyngeal Swab; Nasopharyngeal(NP) swabs in vial transport medium  Result Value Ref Range Status   SARS Coronavirus 2 by  RT PCR NEGATIVE NEGATIVE Final    Comment: (NOTE) SARS-CoV-2 target nucleic acids are NOT DETECTED.  The SARS-CoV-2 RNA is generally detectable in upper respiratory specimens during the acute phase of infection. The lowest concentration of SARS-CoV-2 viral copies this assay can detect is 138 copies/mL. A negative result does not preclude SARS-Cov-2 infection and should not be used as the sole basis for treatment or other patient management decisions. A negative result may occur with  improper specimen collection/handling, submission of specimen other than nasopharyngeal swab, presence of viral mutation(s) within the areas targeted by this assay, and inadequate number of viral copies(<138 copies/mL). A negative result must be combined  with clinical observations, patient history, and epidemiological information. The expected result is Negative.  Fact Sheet for Patients:  EntrepreneurPulse.com.au  Fact Sheet for Healthcare Providers:  IncredibleEmployment.be  This test is no t yet approved or cleared by the [place] FDA and  has been authorized for detection and/or diagnosis of SARS-CoV-2 by FDA under an Emergency Use Authorization (EUA). This EUA will remain  in effect (meaning this test can be used) for the duration of the COVID-19 declaration under Section 564(b)(1) of the Act, 21 U.S.C.section 360bbb-3(b)(1), unless the authorization is terminated  or revoked sooner.       Influenza A by PCR NEGATIVE NEGATIVE Final   Influenza B by PCR NEGATIVE NEGATIVE Final    Comment: (NOTE) The Xpert Xpress SARS-CoV-2/FLU/RSV plus assay is intended as an aid in the diagnosis of influenza from Nasopharyngeal swab specimens and should not be used as a sole basis for treatment. Nasal washings and aspirates are unacceptable for Xpert Xpress SARS-CoV-2/FLU/RSV testing.  Fact Sheet for Patients: EntrepreneurPulse.com.au  Fact Sheet for Healthcare Providers: IncredibleEmployment.be  This test is not yet approved or cleared by the [place] FDA and has been authorized for detection and/or diagnosis of SARS-CoV-2 by FDA under an Emergency Use Authorization (EUA). This EUA will remain in effect (meaning this test can be used) for the duration of the COVID-19 declaration under Section 564(b)(1) of the Act, 21 U.S.C. section 360bbb-3(b)(1), unless the authorization is terminated or revoked.  Performed at [facility], [address] [place], [place] [address]   Surgical PCR screen     Status: None   Collection Time: 09/29/21  8:13 AM   Specimen: Nasal Mucosa; Nasal Swab  Result Value Ref Range Status   MRSA, PCR NEGATIVE NEGATIVE  Final   Staphylococcus aureus NEGATIVE NEGATIVE Final    Comment: (NOTE) The Xpert SA Assay (FDA approved for NASAL specimens in patients 10 years of age and older), is one component of a comprehensive surveillance program. It is not intended to diagnose infection nor to guide or monitor treatment. Performed at [facility], [address] [place], [place] [address]      Radiology Studies: No results found.   Scheduled Meds:  sodium chloride   Intravenous Once   acetaminophen  650 mg Oral Once   Chlorhexidine Gluconate Cloth  6 each Topical Daily   dextrose  1 ampule Intravenous Once   dextrose  1 ampule Intravenous Once   diclofenac  1 patch Transdermal BID   diphenhydrAMINE  25 mg Oral Once   docusate sodium  100 mg Oral BID   feeding supplement (NEPRO CARB STEADY)  237 mL Oral BID BM   ferric citrate  420 mg Oral TID WC   And   ferric citrate  210 mg Oral With snacks   furosemide  20  mg Intravenous Once   insulin aspart  0-5 Units Subcutaneous QHS   insulin aspart  0-6 Units Subcutaneous TID WC   insulin aspart  10 Units Intravenous Once   losartan  50 mg Oral Daily   magnesium oxide  200 mg Oral BID   methocarbamol  750 mg Oral QID   multivitamin  1 tablet Oral QHS   Continuous Infusions:  lactated ringers 10 mL/hr at 09/30/21 0600     LOS: 4 days    Time spent: 25 minutes    Nita Sells, MD [facility]   If 7PM-7AM, please contact night-coverage  10/02/2021, 10:01 AM

## 2021-10-02 NOTE — Progress Notes (Signed)
Occupational Therapy Treatment Patient Details Name: [name] MRN: [id number] DOB: 1965/09/13 Today's Date: 10/02/2021   History of present illness presented to ER secondary to L hip pain; noted with R inferior pubic ramus frature (WBAT), L femoral neck fracture s/p L hemiarthroplasty (WBAT), 09/29/21.   OT comments  [name] was seen for OT/PT co-treatment on this date. Upon arrival to room pt completing BSC>bed t/f with NT and husband. Pt reports increased pain but agreeable to tx. Pt tolerates x2 standing trials at elevated bed height MIN A x2 + RW, tolerates ~30 sec and ~10 sec, assist for lift off with fair balance once upright. Returned to bed MOD A x2, left supine with PT at bedside. RN notified pt requesting pain meds. Pt making good progress toward goals. Pt continues to benefit from skilled OT services to maximize return to PLOF and minimize risk of future falls, injury, caregiver burden, and readmission. Will continue to follow POC. Discharge recommendation remains appropriate.     Recommendations for follow up therapy are one component of a multi-disciplinary discharge planning process, led by the attending physician.  Recommendations may be updated based on patient status, additional functional criteria and insurance authorization.    Follow Up Recommendations  Acute inpatient rehab (3hours/day)    Assistance Recommended at Discharge Frequent or constant Supervision/Assistance  Patient can return home with the following  Two people to help with walking and/or transfers;Two people to help with bathing/dressing/bathroom   Equipment Recommendations  Other (comment) (defer)    Recommendations for Other Services      Precautions / Restrictions Precautions Precautions: Fall Precaution Comments: L AVF, R IJ perm-cath ( will need new dialysis cath) Restrictions Weight Bearing Restrictions: Yes RLE Weight Bearing: Weight bearing as tolerated LLE Weight Bearing: Weight bearing as  tolerated       Mobility Bed Mobility Overal bed mobility: Needs Assistance Bed Mobility: Sit to Supine       Sit to supine: Mod assist, +2 for physical assistance        Transfers Overall transfer level: Needs assistance Equipment used: Rolling walker (2 wheels) Transfers: Sit to/from Stand Sit to Stand: Min assist, +2 physical assistance, From elevated surface           General transfer comment: tolerated x2 trials from elevated bed height with L knee block     Balance Overall balance assessment: Needs assistance Sitting-balance support: No upper extremity supported, Feet supported Sitting balance-Leahy Scale: Good     Standing balance support: Bilateral upper extremity supported Standing balance-Leahy Scale: Fair Standing balance comment: CGA - MIN A once upright                           ADL either performed or assessed with clinical judgement   ADL Overall ADL's : Needs assistance/impaired                                       General ADL Comments: MIN A x2 for ADL t/f      Cognition Arousal/Alertness: Awake/alert Behavior During Therapy: Anxious, WFL for tasks assessed/performed Overall Cognitive Status: Within Functional Limits for tasks assessed                                 General Comments: requires encouragement  Pertinent Vitals/ Pain       Pain Assessment Pain Assessment: Faces Faces Pain Scale: Hurts even more Pain Location: L groin/thigh pain Pain Descriptors / Indicators: Aching, Grimacing, Guarding Pain Intervention(s): Limited activity within patient's tolerance, Repositioned, Patient requesting pain meds-RN notified   Frequency  Min 3X/week        Progress Toward Goals  OT Goals(current goals can now be found in the care plan section)  Progress towards OT goals: Progressing toward goals  Acute Rehab OT Goals Patient Stated Goal: to return to PLOF OT Goal  Formulation: With patient/family Time For Goal Achievement: 10/14/21 Potential to Achieve Goals: Good ADL Goals Pt Will Perform Grooming: with min assist;standing Pt Will Perform Lower Body Dressing: sit to/from stand;with min assist Pt Will Transfer to Toilet: with min assist;ambulating;regular height toilet  Plan Discharge plan remains appropriate;Frequency remains appropriate    Co-evaluation    PT/OT/SLP Co-Evaluation/Treatment: Yes Reason for Co-Treatment: To address functional/ADL transfers PT goals addressed during session: Mobility/safety with mobility OT goals addressed during session: ADL's and self-care      AM-PAC OT "6 Clicks" Daily Activity     Outcome Measure   Help from another person eating meals?: None Help from another person taking care of personal grooming?: A Little Help from another person toileting, which includes using toliet, bedpan, or urinal?: A Lot Help from another person bathing (including washing, rinsing, drying)?: A Lot Help from another person to put on and taking off regular upper body clothing?: A Little Help from another person to put on and taking off regular lower body clothing?: A Lot 6 Click Score: 16    End of Session Equipment Utilized During Treatment: Rolling walker (2 wheels)  OT Visit Diagnosis: Unsteadiness on feet (R26.81);Other abnormalities of gait and mobility (R26.89);Muscle weakness (generalized) (M62.81)   Activity Tolerance Patient tolerated treatment well   Patient Left in bed;with call bell/phone within reach;with family/visitor present;Other (comment) (PT at bedside)   Nurse Communication Patient requests pain meds        Time: 3419-3790 OT Time Calculation (min): 23 min  Charges: OT General Charges $OT Visit: 1 Visit OT Treatments $Self Care/Home Management : 8-22 mins  Dessie Coma, M.S. OTR/L  10/02/21, 3:55 PM  ascom [phone]

## 2021-10-02 NOTE — Progress Notes (Signed)
Physical Therapy Treatment Patient Details Name: [name] MRN: [id number] DOB: 1966-03-10 Today's Date: 10/02/2021   History of Present Illness presented to ER secondary to L hip pain; noted with R inferior pubic ramus frature (WBAT), L femoral neck fracture s/p L hemiarthroplasty (WBAT), 09/29/21.    PT Comments    Pt was sitting EOB with OT in room upon arriving. She is much more alert and motivated today. Pt was able to stand from elevated bed height 2 x with + 2 assistance.  LLE weaker than RLE. Prolonged recovery and constant encouragement due to pt's c/o pain. Pain does limited pt's abilities but overall she was able to tolerate more activity with less difficulty. Author reviewed and pt performed HEP handout. Ice packs applied to L thigh post session. RN staff aware of pt's abilities. She continues to be a great CIR candidate and will continue to benefit from skilled PT to address deficits while maximizing independence with ADLs.    Recommendations for follow up therapy are one component of a multi-disciplinary discharge planning process, led by the attending physician.  Recommendations may be updated based on patient status, additional functional criteria and insurance authorization.  Follow Up Recommendations  Acute inpatient rehab (3hours/day)     Assistance Recommended at Discharge Frequent or constant Supervision/Assistance  Patient can return home with the following Two people to help with walking and/or transfers;Two people to help with bathing/dressing/bathroom;Help with stairs or ramp for entrance   Equipment Recommendations  Rolling walker (2 wheels);BSC/3in1       Precautions / Restrictions Precautions Precautions: Fall Precaution Comments: L AVF, R IJ perm-cath ( will need new dialysis cath) Restrictions Weight Bearing Restrictions: Yes RLE Weight Bearing: Weight bearing as tolerated LLE Weight Bearing: Weight bearing as tolerated     Mobility  Bed  Mobility Overal bed mobility: Needs Assistance Bed Mobility: Sit to Supine       Sit to supine: Mod assist, +2 for physical assistance   General bed mobility comments: pt requires increased time to progress from short sit to supine with most assistance required to progress BLEs back into bed. Pt was fatigued from OOB activity.    Transfers Overall transfer level: Needs assistance Equipment used: Rolling walker (2 wheels) Transfers: Sit to/from Stand Sit to Stand: Min assist, +2 physical assistance, From elevated surface         General transfer comment: tolerated x2 trials from elevated bed height with L knee block      Balance Overall balance assessment: Needs assistance Sitting-balance support: No upper extremity supported, Feet supported Sitting balance-Leahy Scale: Good     Standing balance support: Bilateral upper extremity supported Standing balance-Leahy Scale: Fair     Cognition Arousal/Alertness: Awake/alert Behavior During Therapy: Anxious, WFL for tasks assessed/performed Overall Cognitive Status: Within Functional Limits for tasks assessed      General Comments: Pt is less anxious and more motivated today.         Pertinent Vitals/Pain Pain Assessment Pain Assessment: 0-10 Faces Pain Scale: Hurts even more Pain Location: L groin/thigh pain Pain Descriptors / Indicators: Aching, Grimacing, Guarding Pain Intervention(s): Limited activity within patient's tolerance, Monitored during session, Repositioned, Premedicated before session, Ice applied     PT Goals (current goals can now be found in the care plan section) Acute Rehab PT Goals Patient Stated Goal: to return home Progress towards PT goals: Progressing toward goals    Frequency    7X/week      PT Plan Current plan remains appropriate  Co-evaluation   Reason for Co-Treatment: Complexity of the patient's impairments (multi-system involvement);Necessary to address cognition/behavior  during functional activity;For patient/therapist safety;To address functional/ADL transfers PT goals addressed during session: Mobility/safety with mobility;Balance;Proper use of DME;Strengthening/ROM OT goals addressed during session: ADL's and self-care      AM-PAC PT "6 Clicks" Mobility   Outcome Measure  Help needed turning from your back to your side while in a flat bed without using bedrails?: A Lot Help needed moving from lying on your back to sitting on the side of a flat bed without using bedrails?: A Lot Help needed moving to and from a bed to a chair (including a wheelchair)?: A Lot Help needed standing up from a chair using your arms (e.g., wheelchair or bedside chair)?: Total Help needed to walk in hospital room?: Total Help needed climbing 3-5 steps with a railing? : Total 6 Click Score: 9    End of Session Equipment Utilized During Treatment: Gait belt Activity Tolerance: Patient tolerated treatment well Patient left: in bed;with call bell/phone within reach;with bed alarm set Nurse Communication: Mobility status PT Visit Diagnosis: Muscle weakness (generalized) (M62.81);Other abnormalities of gait and mobility (R26.89);Pain Pain - Right/Left: Left Pain - part of body: Hip     Time: 0131-4388 PT Time Calculation (min) (ACUTE ONLY): 23 min  Charges:  $Therapeutic Activity: 8-22 mins                     Julaine Fusi PTA 10/02/21, 5:04 PM

## 2021-10-02 NOTE — H&P (View-Only) (Signed)
@LOGO@ ° ° °MRN : [id number] ° °[name] is a 55 y.o. (06/19/1966) female who presents with chief complaint of my catheter is not working well. ° °History of Present Illness:  °I am asked to see the patient by Ms. Chantel Breeze for evaluation of a poorly functioning catheter. ° °Per the patient as well as nephrology services they are having increased pressures and poor dialysis runs.  Patient noted they only were able to achieve a flow of 175 yesterday's dialysis run.  The patient has a history of a failed left arm access.   ° °Current access is via a catheter which is functioning poorly.  There have only been 2 previous catheter placements. ° °No history of amaurosis fugax or recent TIA symptoms. There are no recent neurological changes noted. °No history of claudication symptoms or rest pain symptoms. °T no history of DVT, PE or superficial thrombophlebitis. °  ° °Current Meds  °Medication Sig  ° AURYXIA 1 GM 210 MG(Fe) tablet TAKE 2 TABLETS BY MOUTH WITH MEAL AND 1 TABLET WITH SNACK  ° blood glucose meter kit and supplies Dispense based on patient and insurance preference. Use up to four times daily as directed. (FOR ICD-10 E10.9, E11.9).  ° glucagon (GLUCAGON EMERGENCY) 1 MG injection Inject 1 mg into the vein once as needed for up to 1 dose.  ° glucose blood test strip One Touch Ultra, check fsbs 5 times daily  ° Lancets (ONETOUCH ULTRASOFT) lancets Use as instructed  ° losartan (COZAAR) 50 MG tablet Take 50 mg by mouth daily.   ° NOVOFINE 32G X 6 MM MISC 1 EACH BY DOES NOT APPLY ROUTE 3 (THREE) TIMES DAILY WITH MEALS.  ° NOVOLOG FLEXPEN 100 UNIT/ML FlexPen INJECT 10-22 UNITS INTO THE SKIN 3 (THREE) TIMES DAILY WITH MEALS.  ° ° °Past Medical History:  °Diagnosis Date  ° Anemia   ° vitamin d3 deficiency  ° Anxiety   ° Chronic kidney disease   ° End Stage Renal Disease  ° Diabetes mellitus without complication (HCC)   ° GERD (gastroesophageal reflux disease)   ° nothing over last few years  ° High serum  parathyroid hormone (PTH)   ° checked through Dialysis  ° History of kidney stones 2000  ° Hypertension   ° Neuromuscular disorder (HCC)   ° neuropathy in feet  ° PONV (postoperative nausea and vomiting)   ° severe nausea requiring many doses of post op antiemetics  ° Stroke (HCC) 10/2017  ° thinks she had a series of mini strokes.right leg up to right side of face were numb. no loss of consciousness  ° ° °Past Surgical History:  °Procedure Laterality Date  ° ABDOMINAL HYSTERECTOMY  2007  ° AV FISTULA INSERTION W/ RF MAGNETIC GUIDANCE N/A 12/08/2017  ° Procedure: AV FISTULA INSERTION W/RF MAGNETIC GUIDANCE;  Surgeon: Kyiah Canepa G, MD;  Location: [facility];  Service: Cardiovascular;  Laterality: N/A;  ° HIP ARTHROPLASTY Left 09/29/2021  ° Procedure: ARTHROPLASTY BIPOLAR HIP (HEMIARTHROPLASTY);  Surgeon: Bowers, James R, MD;  Location: [facility];  Service: Orthopedics;  Laterality: Left;  ° ROTATOR CUFF REPAIR Right 2004  ° SHOULDER CLOSED REDUCTION Right 2004  ° UPPER EXTREMITY VENOGRAPHY Left 02/15/2018  ° Procedure: UPPER EXTREMITY VENOGRAPHY;  Surgeon: Amada Hallisey G, MD;  Location: [facility];  Service: Cardiovascular;  Laterality: Left;  ° ° °Social History °Social History  ° °Tobacco Use  ° Smoking status: Never  ° Smokeless tobacco: Never  °Vaping Use  °   Vaping Use: Never used  °Substance Use Topics  ° Alcohol use: No  ° Drug use: Never  ° ° °Family History °Family History  °Problem Relation Age of Onset  ° Emphysema Mother   ° COPD Mother   ° Cancer Father   ° Cancer Paternal Aunt   ° Diabetes Sister   ° Hypertension Sister   ° Eczema Sister   ° Diabetes Brother   ° Hypertension Brother   ° Cardiomyopathy Brother   ° Alcohol abuse Brother   ° ° °Allergies  °Allergen Reactions  ° Enalapril Hives and Other (See Comments)  °  Angioedema face.  ° Ivp Dye [Iodinated Contrast Media] Hives  ° Lisinopril Shortness Of Breath  ° Shellfish Allergy Swelling and Other (See Comments)  °  patient  tolerates shellfish by mouth without problem  ° ° ° °REVIEW OF SYSTEMS (Negative unless checked) ° °Constitutional: []Weight loss  []Fever  []Chills °Cardiac: []Chest pain   []Chest pressure   []Palpitations   []Shortness of breath when laying flat   []Shortness of breath with exertion. °Vascular:  []Pain in legs with walking   []Pain in legs at rest  []History of DVT   []Phlebitis   []Swelling in legs   []Varicose veins   []Non-healing ulcers °Pulmonary:   []Uses home oxygen   []Productive cough   []Hemoptysis   []Wheeze  []COPD   []Asthma °Neurologic:  []Dizziness   []Seizures   []History of stroke   []History of TIA  []Aphasia   []Vissual changes   []Weakness or numbness in arm   []Weakness or numbness in leg °Musculoskeletal:   []Joint swelling   []Joint pain   []Low back pain °Hematologic:  []Easy bruising  []Easy bleeding   []Hypercoagulable state   []Anemic °Gastrointestinal:  []Diarrhea   [x]Vomiting post operative  [x]Gastroesophageal reflux/heartburn   []Difficulty swallowing. °Genitourinary:  [x]Chronic kidney disease   []Difficult urination  []Frequent urination   []Blood in urine °Skin:  []Rashes   []Ulcers  °Psychological:  [x]History of anxiety   [] History of major depression. ° °Physical Examination ° °Vitals:  ° 10/02/21 1111 10/02/21 1157 10/02/21 1510 10/02/21 1559  °BP: (!) 148/60 (!) 151/64 138/63 128/61  °Pulse: 94 99 100 97  °Resp: 16 17 17 16  °Temp: 97.8 °F (36.6 °C) (!) 97.5 °F (36.4 °C) 98.3 °F (36.8 °C) 97.8 °F (36.6 °C)  °TempSrc:  Oral Oral   °SpO2: 97%  100% 98%  °Weight:      °Height:      ° °Body mass index is 27.76 kg/m². °Gen: WD/WN, NAD °Head: [place]/AT, No temporalis wasting.  °Ear/Nose/Throat: Hearing grossly intact, nares w/o erythema or drainage °Eyes: PER, EOMI, sclera nonicteric.  °Neck: Supple, no gross masses or lesions.  No JVD.  °Pulmonary:  Good air movement, no audible wheezing, no use of accessory muscles.  °Cardiac: RRR, precordium non-hyperdynamic. °Vascular:   Right  IJ catheter is clean dry and intact it is nontender however it does appear to have pulled back at least 2 to 3 cm °Vessel Right Left  °Radial Palpable Palpable  °Gastrointestinal: soft, non-distended. No guarding/no peritoneal signs.  °Musculoskeletal: M/S 5/5 throughout.  No deformity.  °Neurologic: CN 2-12 intact. Pain and light touch intact in extremities.  Symmetrical.  Speech is fluent. Motor exam as listed above. °Psychiatric: Judgment intact, Mood & affect appropriate for pt's clinical situation. °Dermatologic: No rashes or ulcers noted.  No changes consistent with cellulitis. ° ° °CBC °Lab Results  °Component Value   Date  ° WBC 7.3 10/02/2021  ° HGB 6.3 (L) 10/02/2021  ° HCT 19.5 (L) 10/02/2021  ° MCV 101.6 (H) 10/02/2021  ° PLT 169 10/02/2021  ° ° °BMET °   °Component Value Date/Time  ° NA 133 (L) 10/02/2021 0417  ° K 6.1 (H) 10/02/2021 0417  ° CL 94 (L) 10/02/2021 0417  ° CO2 25 10/02/2021 0417  ° GLUCOSE 183 (H) 10/02/2021 0417  ° BUN 77 (H) 10/02/2021 0417  ° CREATININE 9.21 (H) 10/02/2021 0417  ° CALCIUM 9.0 10/02/2021 0417  ° GFRNONAA 5 (L) 10/02/2021 0417  ° GFRAA 8 (L) 12/06/2017 1505  ° °Estimated Creatinine Clearance: 7.4 mL/min (A) (by C-G formula based on SCr of 9.21 mg/dL (H)). ° °COAG °Lab Results  °Component Value Date  ° INR 0.86 12/06/2017  ° INR 0.85 07/13/2017  ° ° °Radiology °CT Hip Left Wo Contrast ° °Result Date: 09/28/2021 °CLINICAL DATA:  Fall, left hip fracture EXAM: CT OF THE LEFT HIP WITHOUT CONTRAST TECHNIQUE: Multidetector CT imaging of the left hip was performed according to the standard protocol. Multiplanar CT image reconstructions were also generated. RADIATION DOSE REDUCTION: This exam was performed according to the departmental dose-optimization program which includes automated exposure control, adjustment of the mA and/or kV according to patient size and/or use of iterative reconstruction technique. COMPARISON:  X-ray 09/28/2021, CT 06/09/2019 FINDINGS: Bones/Joint/Cartilage  Marked diffuse osteopenia. Acute transcervical fracture through the left femoral neck with significant varus angulation. No definite intertrochanteric involvement. Hip joint alignment is maintained without dislocation. Acute nondisplaced fracture of the right inferior pubic ramus (series 2, image 91). No additional fractures are identified. Moderate arthropathy of the pubic symphysis with new erosive or resorptive changes of the bilateral pubic bones. Symphysis is mildly widened compared to previous CT. Ligaments Suboptimally assessed by CT. Muscles and Tendons No acute musculotendinous abnormality by CT. Soft tissues Mild soft tissue swelling at the femoral neck fracture site. No organized hematoma. No acute findings within the visualized portion of the left hemipelvis. IMPRESSION: 1. Acute transcervical fracture through the LEFT femoral neck with varus angulation. 2. Acute nondisplaced fracture of the RIGHT inferior pubic ramus. 3. Moderate arthropathy of the pubic symphysis with new resorptive or erosive changes of the bilateral pubic bones. This may be secondary to underlying renal osteodystrophy. Septic arthritis could have this appearance in the appropriate clinical setting. Mild widening at the pubic symphysis is favored secondary to underlying arthropathy/resorption rather than posttraumatic diastasis. 4. Marked bony demineralization. Electronically Signed   By: Nicholas  Plundo D.O.   On: 09/28/2021 18:56  ° °DG Chest Port 1 View ° °Result Date: 09/28/2021 °CLINICAL DATA:  Fall EXAM: PORTABLE CHEST 1 VIEW COMPARISON:  None. FINDINGS: The cardiomediastinal silhouette is enlarged in contour.RIGHT chest CVC tip terminating over the superior cavoatrial junction. Enlarged appearance of bilateral hilar contours most consistent with enlarged pulmonary arteries and likely underlying pulmonary arterial hypertension. Elevation of the RIGHT hemidiaphragm. No pleural effusion. No pneumothorax. No acute pleuroparenchymal  abnormality. Visualized abdomen is unremarkable. Osteopenia. IMPRESSION: 1. Cardiomegaly with enlarged hilar contours likely reflecting enlarged pulmonary arteries and underlying pulmonary arterial hypertension. Electronically Signed   By: Stephanie  Peacock M.D.   On: 09/28/2021 17:05  ° °DG C-Arm 1-60 Min-No Report ° °Result Date: 09/29/2021 °Fluoroscopy was utilized by the requesting physician.  No radiographic interpretation.  ° °DG HIP UNILAT WITH PELVIS 1V LEFT ° °Result Date: 09/29/2021 °CLINICAL DATA:  Left hip arthroplasty, fracture left femur EXAM: DG HIP (WITH OR WITHOUT PELVIS) 1V*L*   COMPARISON:  09/28/2021 FINDINGS: Fluoroscopic images show interval left hip arthroplasty. Fluoroscopic time was 6 seconds. Radiation dose is 0.67 mGy. IMPRESSION: Fluoroscopic assistance was provided for left hip arthroplasty. Electronically Signed   By: Palani  Rathinasamy M.D.   On: 09/29/2021 17:02  ° °DG HIP UNILAT WITH PELVIS 2-3 VIEWS LEFT ° °Result Date: 10/02/2021 °CLINICAL DATA:  Left hip pain. EXAM: DG HIP (WITH OR WITHOUT PELVIS) 2-3V LEFT COMPARISON:  Left hip fluoroscopy 09/29/2021 FINDINGS: Interval total left hip arthroplasty. No perihardware lucency is seen to indicate hardware failure or loosening. Mild-to-moderate right femoroacetabular joint space narrowing. There is diffuse decreased bone mineralization. Expected postoperative changes of the left hip including lateral subcutaneous air and lateral surgical skin staples. IMPRESSION: Interval total left hip arthroplasty without evidence of hardware failure. Electronically Signed   By: Ronald  Viola M.D.   On: 10/02/2021 13:08  ° °DG Hip Unilat W or Wo Pelvis 2-3 Views Left ° °Result Date: 09/28/2021 °CLINICAL DATA:  Fall EXAM: DG HIP (WITH OR WITHOUT PELVIS) 2-3V LEFT COMPARISON:  None. FINDINGS: Osteopenia. There is a displaced fracture through the neck and intratrochanteric LEFT femur with superior translocation of the distal femur. Femoral head appears to be  seated within the acetabulum although evaluation is limited by body habitus. No additional fracture noted. Limited assessment of the sacrum secondary to overlapping bowel contents and profound osteopenia. IMPRESSION: Displaced foreshortened fracture of the neck and inter trochanteric LEFT femur. Electronically Signed   By: Stephanie  Peacock M.D.   On: 09/28/2021 17:04   ° ° °Assessment/Plan ° Complication vascular access: °I will plan for a catheter exchange under moderate sedation tomorrow. ° °Risk and benefits of been reviewed all questions have been answered patient agrees to proceed.  She is familiar with this procedure she has had her original catheter placed once in the past. ° °2.  End-stage renal disease: °At the present time the patient does not have adequate dialysis access. °Arrangements will be made to correct this as noted above. ° °Continue hemodialysis as ordered without interruption. ° °Avoid nephrotoxic medications and dehydration. ° °Further plans per nephrology  ° °3.  Diabetes mellitus: °Continue hypoglycemic medications as already ordered, these medications have been reviewed and there are no changes at this time. ° °Hgb A1C to be monitored as already arranged by primary service °  °4.  Hypertension: °Continue antihypertensive medications as already ordered, these medications have been reviewed and there are no changes at this time.  ° °5.  Hypercholesterolemia: °Continue statin as ordered and reviewed, no changes at this time  ° ° °Ilan Kahrs, MD ° °10/02/2021 °6:57 PM ° °  °

## 2021-10-03 ENCOUNTER — Inpatient Hospital Stay
Admission: EM | Disposition: A | Discharge status: Skilled Nursing Facility | Payer: Self-pay | Source: Home / Self Care | Attending: Family Medicine

## 2021-10-03 DIAGNOSIS — S72102A Unspecified trochanteric fracture of left femur, initial encounter for closed fracture: Secondary | ICD-10-CM | POA: Diagnosis not present

## 2021-10-03 DIAGNOSIS — T82898A Other specified complication of vascular prosthetic devices, implants and grafts, initial encounter: Secondary | ICD-10-CM

## 2021-10-03 DIAGNOSIS — Z992 Dependence on renal dialysis: Secondary | ICD-10-CM

## 2021-10-03 DIAGNOSIS — N186 End stage renal disease: Secondary | ICD-10-CM

## 2021-10-03 HISTORY — PX: DIALYSIS/PERMA CATHETER INSERTION: CATH118288

## 2021-10-03 LAB — CBC WITH DIFFERENTIAL/PLATELET
Abs Immature Granulocytes: 0.04 10*3/uL (ref 0.00–0.07)
Basophils Absolute: 0.1 10*3/uL (ref 0.0–0.1)
Basophils Relative: 1 %
Eosinophils Absolute: 0.2 10*3/uL (ref 0.0–0.5)
Eosinophils Relative: 3 %
HCT: 20.5 % — ABNORMAL LOW (ref 36.0–46.0)
Hemoglobin: 6.7 g/dL — ABNORMAL LOW (ref 12.0–15.0)
Immature Granulocytes: 1 %
Lymphocytes Relative: 12 %
Lymphs Abs: 0.8 10*3/uL (ref 0.7–4.0)
MCH: 31.2 pg (ref 26.0–34.0)
MCHC: 32.7 g/dL (ref 30.0–36.0)
MCV: 95.3 fL (ref 80.0–100.0)
Monocytes Absolute: 0.7 10*3/uL (ref 0.1–1.0)
Monocytes Relative: 11 %
Neutro Abs: 4.6 10*3/uL (ref 1.7–7.7)
Neutrophils Relative %: 72 %
Platelets: 183 10*3/uL (ref 150–400)
RBC: 2.15 MIL/uL — ABNORMAL LOW (ref 3.87–5.11)
RDW: 15.7 % — ABNORMAL HIGH (ref 11.5–15.5)
WBC: 6.3 10*3/uL (ref 4.0–10.5)
nRBC: 0 % (ref 0.0–0.2)

## 2021-10-03 LAB — PREPARE RBC (CROSSMATCH)

## 2021-10-03 LAB — RENAL FUNCTION PANEL
Albumin: 3.2 g/dL — ABNORMAL LOW (ref 3.5–5.0)
Anion gap: 13 (ref 5–15)
BUN: 93 mg/dL — ABNORMAL HIGH (ref 6–20)
CO2: 24 mmol/L (ref 22–32)
Calcium: 8.9 mg/dL (ref 8.9–10.3)
Chloride: 92 mmol/L — ABNORMAL LOW (ref 98–111)
Creatinine, Ser: 10.39 mg/dL — ABNORMAL HIGH (ref 0.44–1.00)
GFR, Estimated: 4 mL/min — ABNORMAL LOW (ref >60)
Glucose, Bld: 168 mg/dL — ABNORMAL HIGH (ref 70–99)
Phosphorus: 6.2 mg/dL — ABNORMAL HIGH (ref 2.5–4.6)
Potassium: 5.6 mmol/L — ABNORMAL HIGH (ref 3.5–5.1)
Sodium: 129 mmol/L — ABNORMAL LOW (ref 135–145)

## 2021-10-03 LAB — GLUCOSE, CAPILLARY
Glucose-Capillary: 159 mg/dL — ABNORMAL HIGH (ref 70–99)
Glucose-Capillary: 143 mg/dL — ABNORMAL HIGH (ref 70–99)
Glucose-Capillary: 255 mg/dL — ABNORMAL HIGH (ref 70–99)

## 2021-10-03 SURGERY — DIALYSIS/PERMA CATHETER INSERTION
Anesthesia: Moderate Sedation | Laterality: Right

## 2021-10-03 MED ORDER — FENTANYL CITRATE PF 50 MCG/ML IJ SOSY
PREFILLED_SYRINGE | INTRAMUSCULAR | Status: AC
  Filled 2021-10-03: qty 1

## 2021-10-03 MED ORDER — CEFAZOLIN SODIUM-DEXTROSE 2-4 GM/100ML-% IV SOLN
INTRAVENOUS | Status: AC
  Administered 2021-10-03: 2 g
  Filled 2021-10-03: qty 100

## 2021-10-03 MED ORDER — FENTANYL CITRATE (PF) 100 MCG/2ML IJ SOLN
INTRAMUSCULAR | Status: DC | PRN
  Administered 2021-10-03 (×2): 25 ug via INTRAVENOUS

## 2021-10-03 MED ORDER — MIDAZOLAM HCL 2 MG/2ML IJ SOLN
INTRAMUSCULAR | Status: AC
  Filled 2021-10-03: qty 2

## 2021-10-03 MED ORDER — SODIUM CHLORIDE 0.9% IV SOLUTION: Freq: Once | INTRAVENOUS | Status: AC

## 2021-10-03 MED ORDER — MIDAZOLAM HCL 2 MG/2ML IJ SOLN
INTRAMUSCULAR | Status: DC | PRN
  Administered 2021-10-03: 2 mg via INTRAVENOUS

## 2021-10-03 MED ORDER — HEPARIN SODIUM (PORCINE) 1000 UNIT/ML IJ SOLN
INTRAMUSCULAR | Status: AC
  Filled 2021-10-03: qty 10

## 2021-10-03 MED ORDER — ONDANSETRON HCL 4 MG/2ML IJ SOLN
INTRAMUSCULAR | Status: AC
  Administered 2021-10-03: 4 mg
  Filled 2021-10-03: qty 2

## 2021-10-03 SURGICAL SUPPLY — 6 items
BIOPATCH RED 1 DISK 7.0 (GAUZE/BANDAGES/DRESSINGS) ×1 IMPLANT
CANNULA 5F STIFF (CANNULA) ×1 IMPLANT
CATH PALINDROME-P 19CM W/VT (CATHETERS) ×1 IMPLANT
GUIDEWIRE SUPER STIFF .035X180 (WIRE) ×1 IMPLANT
PACK ANGIOGRAPHY (CUSTOM PROCEDURE TRAY) ×1 IMPLANT
SUT SILK 0 FSL (SUTURE) ×1 IMPLANT

## 2021-10-03 NOTE — Progress Notes (Signed)
PT Cancellation Note  Patient Details Name: [name] MRN: [id number] DOB: 03-05-1966   Cancelled Treatment:     Pt remains off floor. Receive blood this AM and will receive new perm cath for HD. Author will return tomorrow and continue to follow per current POC.    Willette Pa 10/03/2021, 3:31 PM

## 2021-10-03 NOTE — Interval H&P Note (Signed)
History and Physical Interval Note:  10/03/2021 5:31 PM  [name]  has presented today for surgery, with the diagnosis of complication of catheter; nonfunction of catheter, end stage renal disease.  The various methods of treatment have been discussed with the patient and family. After consideration of risks, benefits and other options for treatment, the patient has consented to  Procedure(s): DIALYSIS/PERMA CATHETER INSERTION (Right) as a surgical intervention.  The patient's history has been reviewed, patient examined, no change in status, stable for surgery.  I have reviewed the patient's chart and labs.  Questions were answered to the patient's satisfaction.     [name]

## 2021-10-03 NOTE — Progress Notes (Signed)
OT Cancellation Note  Patient Details Name: Taffie Eckmann MRN: [id number] DOB: 1966-08-06   Cancelled Treatment:    Reason Eval/Treat Not Completed: Patient at procedure or test/ unavailable. Pt currently off the floor for PermCath exchange followed by HD. Will follow up as pt available.   Dessie Coma, M.S. OTR/L  10/03/21, 2:12 PM  ascom [phone]

## 2021-10-03 NOTE — Progress Notes (Signed)
Inpatient Rehabilitation Admissions Coordinator   I will follow up on Monday with her progress to assist with planning timing of CIR admit at [facility] in [place].  Danne Baxter, RN, MSN Rehab Admissions Coordinator [phone] 10/03/2021 2:55 PM

## 2021-10-03 NOTE — Progress Notes (Signed)
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   Vitals in the emergency department showed temperature of 97.8, respiration rate of 18, heart rate of 99, blood pressure 186/92, SPO2 of 99% on room air. Labs in the emergency department showed serum sodium 137, potassium 4.5, chloride 94, bicarb 27, BUN of 59, serum creatinine of seven 7.75, nonfasting blood glucose 121, GFR of 6. WBC was 4.6, hemoglobin 10, platelets of 168 COVID/influenza A/influenza B PCR were negative.   ED provider ordered a left unilateral with or without pelvis x-ray and portable chest x-ray. Unilateral hip x-ray showed left femur fracture. Follow-up CT redemonstrated left femur fracture at the femoral neck and associated right pelvic fracture Orthopedics and nephrology on consult Status post left hip anterior hemiarthroplasty on 1/30  Assessment & Plan:   Principal Problem:   Femur fracture, left (HCC) Active Problems:   Dyslipidemia   Secondary hyperparathyroidism of renal origin ([place])   Diabetic neuropathy ([place])   End stage renal disease on dialysis ([place])   Type 1 diabetes mellitus with chronic kidney disease on chronic dialysis [facility])   Essential hypertension  Femur fracture, left (HCC) Status post left hip hemiarthroplasty 1/30 Tylenol first choice, Norco 1 every 4 as needed second choice  Is on TED hose Approved for CIR and will go on 10/06/2021 End stage renal disease on dialysis [facility]) Hyperkalemia Nonfunctional PERM-Cath PErm cath exchanged 10/03/2021--appreciate Dr. Delana Meyer input M-W-T-F for  2 hours each day as patient is currently being trained to get home hemodialysis for HD today, defer rest to Nephro Anemia of acute blood loss postop expected in the setting of ESRD but recent surgery Patient having access issues IV iron given 2/1 Tx 1 U PRBC 10/02/21, and again 10/03/2021--had some extravasation--elevate R forearm Essential hypertension Amlodipine held, continuing losartan 50 mg daily Labetalol injection 5 mg every 2 hours as needed for SBP greater than 170 Type 1 diabetes mellitus with chronic kidney disease on chronic dialysis [facility]) Per endocrinology OV 2022, patient doesn't takes glargine 15 units [place] nightly Insulin SSI with at bedtime coverage ordered, end-stage renal disease dosing CBGs ranging 140-168 DM coordinator to see regarding her continuous monitor and re-start it  ?  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 Holley Raring [ who has been coordinating her Parathyroid surgery for 10/07/2021] will hopefully be able to help with care coordination  DVT prophylaxis: SCD Code Status: Full Family Communication:Husband at bedside daily Disposition Plan: Status is: Inpatient  Remains inpatient appropriate because: Hip fracture.  Postop day 1 status post hemiarthroplasty     Level of care: Med-Surg  Consultants:  Orthopedics Nephrology  Procedures:  Hip fracture repair 1/30  Antimicrobials: None   Subjective:  Seen on the Cath Lab unit and is doing fair Some pain in the left hip after being placed on the table for permacath exchange About to go to dialysis No chest pain no fever-hungry wants to eat  Objective: Vitals:   10/03/21 1353 10/03/21 1406 10/03/21 1540 10/03/21 1545  BP: (!) 159/67 (!) 164/71 (!) 146/95 (!) 165/81  Pulse: 93 98 (!) 102 (!)  102  Resp: 16 (!) 23 (!) 25 (!) 22  Temp: 98.2 F (36.8 C) 98.2 F (36.8 C)    TempSrc: Oral  Oral    SpO2: 100% 97% 92% 92%  Weight:      Height:        Intake/Output Summary (Last 24 hours) at 10/03/2021 1603 Last data filed at 10/02/2021 2014 Gross per 24 hour  Intake --  Output 0 ml  Net 0 ml    Filed Weights   09/29/21 1845 10/01/21 1043 10/01/21 1200  Weight: 85.7 kg 79.2 kg 80.4 kg    Examination:   EOMI NCAT cushingoid appearance-female pattern baldness  proptosis eyes No thyromegaly Mallampatti 3, poor dentition Neck soft webbed S1-S2 no murmur Chest clear no added sound no rales no rhonchi Slight left lower extremity swelling Patient has some extravasation in the right upper arm where IV site was   Data Reviewed: I have personally reviewed following labs and imaging studies  CBC: Recent Labs  Lab 09/28/21 1556 09/29/21 0630 09/30/21 0645 10/01/21 0329 10/02/21 0417 10/03/21 0352  WBC 4.6 5.4 9.1 8.7 7.3 6.3  NEUTROABS 3.4  --   --   --  5.6 4.6  HGB 10.0* 9.9* 7.7* 6.7* 6.3* 6.7*  HCT 31.3* 30.2* 23.7* 20.6* 19.5* 20.5*  MCV 101.3* 97.4 100.4* 100.0 101.6* 95.3  PLT 168 158 189 167 169 335    Basic Metabolic Panel: Recent Labs  Lab 09/28/21 1556 09/29/21 0630 09/30/21 0645 10/02/21 0417 10/03/21 0352  NA 137 135 133* 133* 129*  K 4.5 5.3* 5.6* 6.1* 5.6*  CL 94* 93* 94* 94* 92*  CO2 27 25 25 25 24   GLUCOSE 121* 130* 162* 183* 168*  BUN 59* 69* 57* 77* 93*  CREATININE 7.75* 8.78* 7.30* 9.21* 10.39*  CALCIUM 10.0 9.4 8.4* 9.0 8.9  MG  --  2.4  --   --   --   PHOS  --  6.3*  --  6.0* 6.2*    GFR: Estimated Creatinine Clearance: 6.5 mL/min (A) (by C-G formula based on SCr of 10.39 mg/dL (H)). Liver Function Tests: Recent Labs  Lab 10/02/21 0417 10/03/21 0352  ALBUMIN 2.9* 3.2*    No results for input(s): LIPASE, AMYLASE in the last 168 hours. No results for input(s): AMMONIA in the last 168 hours. Coagulation Profile: No results for input(s): INR, PROTIME in the last 168 hours. Cardiac Enzymes: No results for input(s):  CKTOTAL, CKMB, CKMBINDEX, TROPONINI in the last 168 hours. BNP (last 3 results) No results for input(s): PROBNP in the last 8760 hours. HbA1C: No results for input(s): HGBA1C in the last 72 hours.  CBG: Recent Labs  Lab 10/02/21 1112 10/02/21 1611 10/02/21 2016 10/03/21 0731 10/03/21 1105  GLUCAP 200* 222* 213* 159* 143*    Lipid Profile: No results for input(s): CHOL, HDL, LDLCALC, TRIG, CHOLHDL, LDLDIRECT in the last 72 hours. Thyroid Function Tests: No results for input(s): TSH, T4TOTAL, FREET4, T3FREE, THYROIDAB in the last 72 hours.  Anemia Panel: No results for input(s): VITAMINB12, FOLATE, FERRITIN, TIBC, IRON, RETICCTPCT in the last 72 hours.  Sepsis Labs: No results for input(s): PROCALCITON, LATICACIDVEN in the last 168 hours.  Recent Results (from the past 240 hour(s))  Resp Panel by RT-PCR (Flu A&B, Covid) Nasopharyngeal Swab     Status: None   Collection Time: 09/28/21  3:50 PM   Specimen: Nasopharyngeal Swab; Nasopharyngeal(NP) swabs in vial transport medium  Result Value Ref Range Status   SARS Coronavirus 2  by RT PCR NEGATIVE NEGATIVE Final    Comment: (NOTE) SARS-CoV-2 target nucleic acids are NOT DETECTED.  The SARS-CoV-2 RNA is generally detectable in upper respiratory specimens during the acute phase of infection. The lowest concentration of SARS-CoV-2 viral copies this assay can detect is 138 copies/mL. A negative result does not preclude SARS-Cov-2 infection and should not be used as the sole basis for treatment or other patient management decisions. A negative result may occur with  improper specimen collection/handling, submission of specimen other than nasopharyngeal swab, presence of viral mutation(s) within the areas targeted by this assay, and inadequate number of viral copies(<138 copies/mL). A negative result must be combined with clinical observations, patient history, and epidemiological information. The expected result is  Negative.  Fact Sheet for Patients:  EntrepreneurPulse.com.au  Fact Sheet for Healthcare Providers:  IncredibleEmployment.be  This test is no t yet approved or cleared by the [place] FDA and  has been authorized for detection and/or diagnosis of SARS-CoV-2 by FDA under an Emergency Use Authorization (EUA). This EUA will remain  in effect (meaning this test can be used) for the duration of the COVID-19 declaration under Section 564(b)(1) of the Act, 21 U.S.C.section 360bbb-3(b)(1), unless the authorization is terminated  or revoked sooner.       Influenza A by PCR NEGATIVE NEGATIVE Final   Influenza B by PCR NEGATIVE NEGATIVE Final    Comment: (NOTE) The Xpert Xpress SARS-CoV-2/FLU/RSV plus assay is intended as an aid in the diagnosis of influenza from Nasopharyngeal swab specimens and should not be used as a sole basis for treatment. Nasal washings and aspirates are unacceptable for Xpert Xpress SARS-CoV-2/FLU/RSV testing.  Fact Sheet for Patients: EntrepreneurPulse.com.au  Fact Sheet for Healthcare Providers: IncredibleEmployment.be  This test is not yet approved or cleared by the [place] FDA and has been authorized for detection and/or diagnosis of SARS-CoV-2 by FDA under an Emergency Use Authorization (EUA). This EUA will remain in effect (meaning this test can be used) for the duration of the COVID-19 declaration under Section 564(b)(1) of the Act, 21 U.S.C. section 360bbb-3(b)(1), unless the authorization is terminated or revoked.  Performed at [facility], [address] [place], [place] [address]   Surgical PCR screen     Status: None   Collection Time: 09/29/21  8:13 AM   Specimen: Nasal Mucosa; Nasal Swab  Result Value Ref Range Status   MRSA, PCR NEGATIVE NEGATIVE Final   Staphylococcus aureus NEGATIVE NEGATIVE Final    Comment: (NOTE) The Xpert SA Assay (FDA  approved for NASAL specimens in patients 79 years of age and older), is one component of a comprehensive surveillance program. It is not intended to diagnose infection nor to guide or monitor treatment. Performed at [facility], [address], [place], [place] [address]      Radiology Studies: PERIPHERAL VASCULAR CATHETERIZATION  Result Date: 10/03/2021 See surgical note for result.  DG HIP UNILAT WITH PELVIS 2-3 VIEWS LEFT  Result Date: 10/02/2021 CLINICAL DATA:  Left hip pain. EXAM: DG HIP (WITH OR WITHOUT PELVIS) 2-3V LEFT COMPARISON:  Left hip fluoroscopy 09/29/2021 FINDINGS: Interval total left hip arthroplasty. No perihardware lucency is seen to indicate hardware failure or loosening. Mild-to-moderate right femoroacetabular joint space narrowing. There is diffuse decreased bone mineralization. Expected postoperative changes of the left hip including lateral subcutaneous air and lateral surgical skin staples. IMPRESSION: Interval total left hip arthroplasty without evidence of hardware failure. Electronically Signed   By: Viann Fish.D.  On: 10/02/2021 13:08     Scheduled Meds:  [XKG Hold] sodium chloride   Intravenous Once   [MAR Hold] acetaminophen  650 mg Oral Once   [MAR Hold] Chlorhexidine Gluconate Cloth  6 each Topical Daily   [MAR Hold] dextrose  1 ampule Intravenous Once   [MAR Hold] dextrose  1 ampule Intravenous Once   [MAR Hold] diclofenac  1 patch Transdermal BID   [MAR Hold] diphenhydrAMINE  25 mg Oral Once   [MAR Hold] docusate sodium  100 mg Oral BID   [MAR Hold] feeding supplement (NEPRO CARB STEADY)  237 mL Oral BID BM   fentaNYL       [MAR Hold] ferric citrate  420 mg Oral TID WC   And   [MAR Hold] ferric citrate  210 mg Oral With snacks   [MAR Hold] furosemide  20 mg Intravenous Once   [MAR Hold] insulin aspart  0-5 Units Subcutaneous QHS   [MAR Hold] insulin aspart  0-6 Units Subcutaneous TID WC   [MAR Hold] insulin aspart  10 Units  Intravenous Once   [MAR Hold] losartan  50 mg Oral Daily   [MAR Hold] magnesium oxide  200 mg Oral BID   [MAR Hold] methocarbamol  750 mg Oral QID   midazolam       [MAR Hold] multivitamin  1 tablet Oral QHS   [MAR Hold] Vitamin D (Ergocalciferol)  50,000 Units Oral Q7 days   Continuous Infusions:  [MAR Hold]  ceFAZolin (ANCEF) IV       LOS: 5 days    Time spent: 25 minutes    Nita Sells, MD [facility]   If 7PM-7AM, please contact night-coverage  10/03/2021, 4:03 PM

## 2021-10-03 NOTE — Progress Notes (Signed)
PT Cancellation Note  Patient Details Name: [name] Husband MRN: [id number] DOB: 10/11/1965   Cancelled Treatment:     PT attempt. Pt politely refused. Planned perm cath placement at 130pm this date. Pt unsure if she will have HD afterwards. She requested holding therapy at this time. Will continue to follow and progress as able per current POC.   Willette Pa 10/03/2021, 10:43 AM

## 2021-10-03 NOTE — Progress Notes (Signed)
Hemodialysis notes  Hd completed, tolerated well. Pt asymptomatic. Total treatment time 3 hours, Total UF net removed 2L.

## 2021-10-03 NOTE — PMR Pre-admission (Signed)
PMR Admission Coordinator Pre-Admission Assessment  Patient: [name] is an 56 y.o., female MRN: [id number] DOB: 10/30/1965 Height: 5\' 7"  (170.2 cm) Weight: 79.2 kg Insurance Information HMO:     PPO:      PCP:      IPA:      80/20:      OTHER:  PRIMARY: Medicare a and b      Policy#: [id number]      Subscriber: pt Benefits:  Phone #: passport one source online     Name: 10/01/2021 Eff. Date: 07/31/2018     Deduct: $1600      Out of Pocket Max: none      Life Max: none CIR: 100%      SNF: 20 full days Outpatient: 80%     Co-Pay: 20% Home Health: 100%      Co-Pay: none DME: 80%     Co-Pay: 20% Providers: pt choice  SECONDARY: [place] [employer]      Policy#: [id number] verified to be secondary by pre service center as well as call to [employer] at [phone] per rep on 2/1 reference number [id number]  Financial Counselor:       Phone#:   The Data Collection Information Summary for patients in Inpatient Rehabilitation Facilities with attached Privacy Act [place] Records was provided and verbally reviewed with: Patient and Family  Emergency Contact Information Contact Information     Name Relation Home Work Mobile   [place] Spouse [phone]        Current Medical History  Patient Admitting Diagnosis: Hip Fracture  History of Present Illness:  56 year old right-handed female with history of end-stage renal disease with hemodialysis via right IJ, diabetes mellitus with neuropathy, acute on chronic anemia, hyperlipidemia, obesity with BMI 27.76, secondary hyperparathyroidism of renal origin and currently awaiting outpatient scheduling for evaluation of parathyroidectomy, hypertension.   Modified independent with Rollator for household and community ambulation and bilateral axillary crutches for stair negotiation.  Presented to [facility] 09/28/2021 with complaints of severe left hip pain.  Patient states her leg gave out and she heard a pop on the left hip.  She also  had complaints of right side hip pain from a fall.  X-rays and imaging revealed displaced foreshortened fracture of the neck and intertrochanteric left femur as well as acute nondisplaced fracture of the right inferior pubic ramus.  Patient underwent left hip anterior hip hemiarthroplasty 09/29/2021 per Dr. Kurtis Bushman.  Conservative care of right inferior pubic ramus fracture.  Patient is currently weightbearing as tolerated left lower extremity as well as weightbearing as tolerated right lower extremity.  Hospital course hemodialysis ongoing vascular surgery Dr Rica Koyanagi consulted to evaluate for exchange PermCath from right IJ to better dialysis 10/03/2021.  Hospital course acute on chronic anemia latest hemoglobin 6.7 of which she was transfused and follow-up hemoglobin 7.7 and patient also remains on iron supplement.    Patient's medical record from [facility] has been reviewed by the rehabilitation admission coordinator and physician.  Past Medical History  Past Medical History:  Diagnosis Date   Anemia    vitamin d3 deficiency   Anxiety    Chronic kidney disease    End Stage Renal Disease   Diabetes mellitus without complication (HCC)    GERD (gastroesophageal reflux disease)    nothing over last few years   High serum parathyroid hormone (PTH)    checked through Dialysis   History of kidney stones 2000   Hypertension    Neuromuscular disorder ([place])  neuropathy in feet   PONV (postoperative nausea and vomiting)    severe nausea requiring many doses of post op antiemetics   Stroke ([place]) 10/2017   thinks she had a series of mini strokes.right leg up to right side of face were numb. no loss of consciousness   Has the patient had major surgery during 100 days prior to admission? Yes  Family History   family history includes Alcohol abuse in her brother; COPD in her mother; Cancer in her father and paternal aunt; Cardiomyopathy in her brother; Diabetes in her brother and sister; Eczema  in her sister; Emphysema in her mother; Hypertension in her brother and sister.  Current Medications  Current Facility-Administered Medications:    0.9 %  sodium chloride infusion (Manually program via Guardrails IV Fluids), , Intravenous, Once, Nita Sells, MD   acetaminophen (TYLENOL) tablet 650 mg, 650 mg, Oral, Once, Foust, Katy L, NP   alum & mag hydroxide-simeth (MAALOX/MYLANTA) 200-200-20 MG/5ML suspension 30 mL, 30 mL, Oral, Q4H PRN, Lovell Sheehan, MD   bisacodyl (DULCOLAX) suppository 10 mg, 10 mg, Rectal, Daily PRN, Lovell Sheehan, MD   Chlorhexidine Gluconate Cloth 2 % PADS 6 each, 6 each, Topical, Daily, Sreenath, Sudheer B, MD, 6 each at 10/05/21 1737   dextrose 50 % solution 50 mL, 1 ampule, Intravenous, Once, Lovell Sheehan, MD   dextrose 50 % solution 50 mL, 1 ampule, Intravenous, Once, Lovell Sheehan, MD   diclofenac (FLECTOR) 1.3 % 1 patch, 1 patch, Transdermal, BID, Nita Sells, MD, 1 patch at 10/05/21 1007   diphenhydrAMINE (BENADRYL) capsule 25 mg, 25 mg, Oral, Once, Samtani, Jai-Gurmukh, MD   docusate sodium (COLACE) capsule 100 mg, 100 mg, Oral, BID, Lovell Sheehan, MD, 100 mg at 10/05/21 2152   feeding supplement (NEPRO CARB STEADY) liquid 237 mL, 237 mL, Oral, BID BM, Sreenath, Sudheer B, MD, 237 mL at 10/04/21 0932   ferric citrate (AURYXIA) tablet 420 mg, 420 mg, Oral, TID WC, 420 mg at 10/05/21 1733 **AND** ferric citrate (AURYXIA) tablet 210 mg, 210 mg, Oral, With snacks, Lovell Sheehan, MD, 210 mg at 10/05/21 2035   furosemide (LASIX) injection 20 mg, 20 mg, Intravenous, Once, Samtani, Jai-Gurmukh, MD   hydrALAZINE (APRESOLINE) injection 10 mg, 10 mg, Intravenous, Q4H PRN, Lovell Sheehan, MD   HYDROcodone-acetaminophen (NORCO/VICODIN) 5-325 MG per tablet 1 tablet, 1 tablet, Oral, Q4H PRN, Lovell Sheehan, MD, 1 tablet at 10/05/21 2152   insulin aspart (novoLOG) injection 0-5 Units, 0-5 Units, Subcutaneous, QHS, Lovell Sheehan, MD, 3 Units  at 10/03/21 2321   insulin aspart (novoLOG) injection 0-6 Units, 0-6 Units, Subcutaneous, TID WC, Lovell Sheehan, MD, 2 Units at 10/05/21 1240   insulin aspart (novoLOG) injection 10 Units, 10 Units, Intravenous, Once, Lovell Sheehan, MD   labetalol (NORMODYNE) injection 5 mg, 5 mg, Intravenous, Q2H PRN, Lovell Sheehan, MD, 5 mg at 09/28/21 2019   losartan (COZAAR) tablet 50 mg, 50 mg, Oral, Daily, Lovell Sheehan, MD, 50 mg at 10/05/21 1002   magnesium hydroxide (MILK OF MAGNESIA) suspension 30 mL, 30 mL, Oral, Daily PRN, Lovell Sheehan, MD   magnesium oxide (MAG-OX) tablet 200 mg, 200 mg, Oral, BID, Lovell Sheehan, MD, 200 mg at 10/05/21 2151   melatonin tablet 5 mg, 5 mg, Oral, QHS PRN, Lovell Sheehan, MD   menthol-cetylpyridinium (CEPACOL) lozenge 3 mg, 1 lozenge, Oral, PRN **OR** phenol (CHLORASEPTIC) mouth spray 1 spray, 1 spray, Mouth/Throat, PRN, Harlow Mares,  Elyn Aquas, MD   methocarbamol (ROBAXIN) tablet 750 mg, 750 mg, Oral, QID, Sreenath, Sudheer B, MD, 750 mg at 10/05/21 2150   multivitamin (RENA-VIT) tablet 1 tablet, 1 tablet, Oral, QHS, Sreenath, Sudheer B, MD, 1 tablet at 10/05/21 2210   ondansetron (ZOFRAN) tablet 4 mg, 4 mg, Oral, Q6H PRN, 4 mg at 10/03/21 1513 **OR** ondansetron (ZOFRAN) injection 4 mg, 4 mg, Intravenous, Q6H PRN, Lovell Sheehan, MD   Vitamin D (Ergocalciferol) (DRISDOL) capsule 50,000 Units, 50,000 Units, Oral, Q7 days, Nita Sells, MD, 50,000 Units at 10/02/21 1607  Patients Current Diet:  Diet Order             Diet - low sodium heart healthy           Diet renal/carb modified with fluid restriction Diet-HS Snack? Nothing; Fluid restriction: 1200 mL Fluid; Room service appropriate? Yes; Fluid consistency: Thin  Diet effective now                  Precautions / Restrictions Precautions Precautions: Fall Precaution Comments: L AVF, R IJ perm-cath ( will need new dialysis cath) Restrictions Weight Bearing Restrictions: Yes RLE Weight  Bearing: Weight bearing as tolerated LLE Weight Bearing: Weight bearing as tolerated   Has the patient had 2 or more falls or a fall with injury in the past year? Yes  Prior Activity Level Community (5-7x/wk): decline in funcion; using RW and crutches as needed  Prior Functional Level Self Care: Did the patient need help bathing, dressing, using the toilet or eating? Independent  Indoor Mobility: Did the patient need assistance with walking from room to room (with or without device)? Independent  Stairs: Did the patient need assistance with internal or external stairs (with or without device)? Independent  Functional Cognition: Did the patient need help planning regular tasks such as shopping or remembering to take medications? Independent  Patient Information Are you of Hispanic, Latino/a,or Spanish origin?: A. No, not of Hispanic, Latino/a, or Spanish origin What is your race?: A. White Do you need or want an interpreter to communicate with a doctor or health care staff?: 0. No  Patient's Response To:  Health Literacy and Transportation Is the patient able to respond to health literacy and transportation needs?: Yes Health Literacy - How often do you need to have someone help you when you read instructions, pamphlets, or other written material from your doctor or pharmacy?: Never In the past 12 months, has lack of transportation kept you from medical appointments or from getting medications?: No In the past 12 months, has lack of transportation kept you from meetings, work, or from getting things needed for daily living?: No  Home Assistive Devices / [place] Devices/Equipment: Environmental consultant (specify type), Crutches, Shower chair with back Home Equipment: Crutches, Rollator (4 wheels)  Prior Device Use: Indicate devices/aids used by the patient prior to current illness, exacerbation or injury? Walker  Current Functional Level Cognition  Overall Cognitive Status: Within  Functional Limits for tasks assessed Orientation Level: Oriented X4 General Comments: Pt is less anxious and more motivated today.    Extremity Assessment (includes Sensation/Coordination)  Upper Extremity Assessment: Overall WFL for tasks assessed  Lower Extremity Assessment: Generalized weakness    ADLs  Overall ADL's : Needs assistance/impaired General ADL Comments: MIN A x2 for ADL t/f    Mobility  Overal bed mobility: Needs Assistance Bed Mobility: Supine to Sit Supine to sit: Min guard Sit to supine: Mod assist, +2 for physical assistance General  bed mobility comments: increased time but with leg lifter/gait belt she is able to manage on her own    Transfers  Overall transfer level: Needs assistance Equipment used: Rolling walker (2 wheels) Transfers: Sit to/from Stand Sit to Stand: Mod assist, +2 physical assistance Bed to/from chair/wheelchair/BSC transfer type:: Step pivot Step pivot transfers: Mod assist, Max assist, +2 physical assistance  Lateral/Scoot Transfers: Mod assist Transfer via Lift Equipment:  (hoyer) General transfer comment: able to transfert to/from commode with heavy assist and cues with physical assist to move LLE    Ambulation / Gait / Stairs / Wheelchair Mobility  Ambulation/Gait Ambulation/Gait assistance: Mod assist, Max assist Gait Distance (Feet): 3 Feet Assistive device: Rolling walker (2 wheels) Gait Pattern/deviations: Step-to pattern General Gait Details: unsafe to progress further than transfers Gait velocity: decreased    Posture / Balance Balance Overall balance assessment: Needs assistance Sitting-balance support: No upper extremity supported, Feet supported Sitting balance-Leahy Scale: Good Standing balance support: Bilateral upper extremity supported Standing balance-Leahy Scale: Poor Standing balance comment: CGA - MIN A once upright    Special needs/care consideration Was training for home hemodialysis   Previous Home  Environment  Living Arrangements: Spouse/significant other Available Help at Discharge: Family, Available PRN/intermittently Type of Home: House Home Layout: One level Home Access: Stairs to enter Entrance Stairs-Rails: Left, Right Entrance Stairs-Number of Steps: 5 Bathroom Shower/Tub: Government social research officer Accessibility: Yes How Accessible: Accessible via walker Home Care Services:  (training for home hemodialysis)  Discharge Living Setting Plans for Discharge Living Setting: Patient's home, Lives with (comment) (spouse) Type of Home at Discharge: House Discharge Home Layout: One level Discharge Home Access: Stairs to enter Entrance Stairs-Rails: Right, Left Entrance Stairs-Number of Steps: 5 Discharge Bathroom Shower/Tub: Tub/shower unit Discharge Bathroom Toilet: Standard Discharge Bathroom Accessibility: Yes How Accessible: Accessible via walker Does the patient have any problems obtaining your medications?: No  Social/Family/Support Systems Patient Roles: Spouse Contact Information: spouse, Aaron Edelman Anticipated Caregiver: Spouse Anticipated Caregiver's Contact Information: see contacts Ability/Limitations of Caregiver: none Caregiver Availability: 24/7 Discharge Plan Discussed with Primary Caregiver: Yes Is Caregiver In Agreement with Plan?: Yes Does Caregiver/Family have Issues with Lodging/Transportation while Pt is in Rehab?: No  Goals Patient/Family Goal for Rehab: supervision to min assist with PT and OT Expected length of stay: ELOS 10 to 14 days Pt/Family Agrees to Admission and willing to participate: Yes Program Orientation Provided & Reviewed with Pt/Caregiver Including Roles  & Responsibilities: Yes  Decrease burden of Care through IP rehab admission: n/a  Possible need for SNF placement upon discharge: not anticipated  Patient Condition: I have reviewed medical records from [facility], spoken with CM, and patient and spouse. I  discussed via phone for inpatient rehabilitation assessment.  Patient will benefit from ongoing PT and OT, can actively participate in 3 hours of therapy a day 5 days of the week, and can make measurable gains during the admission.  Patient will also benefit from the coordinated team approach during an Inpatient Acute Rehabilitation admission.  The patient will receive intensive therapy as well as Rehabilitation physician, nursing, social worker, and care management interventions.  Due to bladder management, bowel management, safety, skin/wound care, disease management, medication administration, pain management, and patient education the patient requires 24 hour a day rehabilitation nursing.  The patient is currently Mod assist overall with mobility  and basic ADLs.  Discharge setting and therapy post discharge at home with home health is anticipated.  Patient has agreed to participate in the  Acute Inpatient Rehabilitation Program and will admit today.  Preadmission Screen Completed By:  Cleatrice Burke, 10/06/2021 10:04 AM ______________________________________________________________________   Discussed status with Dr. Naaman Plummer on 10/06/2021 at 1004 and received approval for admission today.  Admission Coordinator:  Cleatrice Burke, RN, time 1004 Date 10/06/2021   Assessment/Plan: Diagnosis: left FNF/IT fx/ right IPR fx after fall Does the need for close, 24 hr/day Medical supervision in concert with the patient's rehab needs make it unreasonable for this patient to be served in a less intensive setting? Yes Co-Morbidities requiring supervision/potential complications: ESRD on HD, dm with neuropathy, anemia Due to bladder management, bowel management, safety, skin/wound care, disease management, medication administration, pain management, and patient education, does the patient require 24 hr/day rehab nursing? Yes Does the patient require coordinated care of a physician, rehab nurse, PT, OT,   to address physical and functional deficits in the context of the above medical diagnosis(es)? Yes Addressing deficits in the following areas: balance, endurance, locomotion, strength, transferring, bowel/bladder control, bathing, dressing, feeding, grooming, toileting, and psychosocial support Can the patient actively participate in an intensive therapy program of at least 3 hrs of therapy 5 days a week? Yes The potential for patient to make measurable gains while on inpatient rehab is excellent Anticipated functional outcomes upon discharge from inpatient rehab: supervision and min assist PT, supervision and min assist OT, n/a SLP Estimated rehab length of stay to reach the above functional goals is: 10-14 days Anticipated discharge destination: Home 10. Overall Rehab/Functional Prognosis: excellent   MD Signature: Meredith Staggers, MD, [place] Director Rehabilitation Services 10/06/2021

## 2021-10-03 NOTE — Op Note (Signed)
OPERATIVE NOTE   PROCEDURE: Insertion of tunneled dialysis catheter right IJ approach same venous access.  PRE-OPERATIVE DIAGNOSIS: Nonfunction of existing tunneled dialysis catheter, and stage renal disease requiring hemodialysis   POST-OPERATIVE DIAGNOSIS: Same SURGEON: [name]  ANESTHESIA: Conscious sedation was administered under my direct supervision by the interventional radiology RN.  IV Versed plus fentanyl were utilized. Continuous ECG, pulse oximetry and blood pressure was monitored throughout the entire procedure.  Conscious sedation was for a total of 16 minutes and 6 seconds  ESTIMATED BLOOD LOSS: Minimal cc  CONTRAST USED:  None  FLUOROSCOPY TIME: 0.5 minutes  INDICATIONS:   [name] is a 56 y.o.y.o. female who presents with poor flow and nonfunction of the tunneled dialysis catheter.  Adequate dialysis has not been possible.  DESCRIPTION: After obtaining full informed written consent, the patient was positioned supine. The right neck and chest wall was prepped and draped in a sterile fashion. The cuff is localized and using blunt and sharp dissection it is freed from the surrounding adhesions.  The existing catheter is then transected proximal to the cuff.  The guidewire is advanced without difficulty under fluoroscopy.  Dilators are passed over the wire as needed and the tunneled dialysis catheter is fed into the central venous system without difficulty.  Under fluoroscopy the catheter tip positioned at the atrial caval junction.  Both lumens aspirate and flush easily. After verification of smooth contour with proper tip position under fluoroscopy the catheter is packed with 5000 units of heparin per lumen.  Catheter secured to the skin of the right chest wall with 0 silk. A sterile dressing is applied with a Biopatch.  COMPLICATIONS: None  CONDITION: [name] [facility] Office:  [phone]   10/03/2021,3:46 PM

## 2021-10-03 NOTE — OR Nursing (Signed)
Unable to get new IV in. Headed to vascular lab. Dr Delana Meyer will give meds via perm cath during procedure

## 2021-10-03 NOTE — Progress Notes (Signed)
[facility]  ROUNDING NOTE   Subjective:   Patient currently resting in bed, alert and oriented Currently n.p.o. for scheduled PermCath exchange States pain well managed left hip Continues to complain of soreness on the right side from fall  Plan to dialyze later today after procedure   Objective:  Vital signs in last 24 hours:  Temp:  [97.5 F (36.4 C)-98.9 F (37.2 C)] 98.9 F (37.2 C) (02/03 1043) Pulse Rate:  [94-101] 94 (02/03 1043) Resp:  [15-17] 16 (02/03 1040) BP: (128-165)/(52-75) 165/75 (02/03 1043) SpO2:  [96 %-100 %] 99 % (02/03 1043)  Weight change:  Filed Weights   09/29/21 1845 10/01/21 1043 10/01/21 1200  Weight: 85.7 kg 79.2 kg 80.4 kg    Intake/Output: I/O last 3 completed shifts: In: 330 [Blood:330] Out: 0    Intake/Output this shift:  No intake/output data recorded.  Physical Exam: General: NAD  Head: Normocephalic, atraumatic. Moist oral mucosal membranes  Eyes: Anicteric  Lungs:  Clear to auscultation, normal effort  Heart: S1S2 no rubs  Abdomen:  Soft, nontender, bowel sounds present  Extremities: trace peripheral edema.  Neurologic: Awake, alert, following commands  Skin: No acute rash, left hip surgical dressing  Access: IJ PermCath    Basic Metabolic Panel: Recent Labs  Lab 09/28/21 1556 09/29/21 0630 09/30/21 0645 10/02/21 0417 10/03/21 0352  NA 137 135 133* 133* 129*  K 4.5 5.3* 5.6* 6.1* 5.6*  CL 94* 93* 94* 94* 92*  CO2 27 25 25 25 24   GLUCOSE 121* 130* 162* 183* 168*  BUN 59* 69* 57* 77* 93*  CREATININE 7.75* 8.78* 7.30* 9.21* 10.39*  CALCIUM 10.0 9.4 8.4* 9.0 8.9  MG  --  2.4  --   --   --   PHOS  --  6.3*  --  6.0* 6.2*     Liver Function Tests: Recent Labs  Lab 10/02/21 0417 10/03/21 0352  ALBUMIN 2.9* 3.2*    No results for input(s): LIPASE, AMYLASE in the last 168 hours. No results for input(s): AMMONIA in the last 168 hours.  CBC: Recent Labs  Lab 09/28/21 1556 09/29/21 0630  09/30/21 0645 10/01/21 0329 10/02/21 0417 10/03/21 0352  WBC 4.6 5.4 9.1 8.7 7.3 6.3  NEUTROABS 3.4  --   --   --  5.6 4.6  HGB 10.0* 9.9* 7.7* 6.7* 6.3* 6.7*  HCT 31.3* 30.2* 23.7* 20.6* 19.5* 20.5*  MCV 101.3* 97.4 100.4* 100.0 101.6* 95.3  PLT 168 158 189 167 169 183     Cardiac Enzymes: No results for input(s): CKTOTAL, CKMB, CKMBINDEX, TROPONINI in the last 168 hours.  BNP: Invalid input(s): POCBNP  CBG: Recent Labs  Lab 10/02/21 1112 10/02/21 1611 10/02/21 2016 10/03/21 0731 10/03/21 1105  GLUCAP 200* 222* 213* 159* 143*     Microbiology: Results for orders placed or performed during the hospital encounter of 09/28/21  Resp Panel by RT-PCR (Flu A&B, Covid) Nasopharyngeal Swab     Status: None   Collection Time: 09/28/21  3:50 PM   Specimen: Nasopharyngeal Swab; Nasopharyngeal(NP) swabs in vial transport medium  Result Value Ref Range Status   SARS Coronavirus 2 by RT PCR NEGATIVE NEGATIVE Final    Comment: (NOTE) SARS-CoV-2 target nucleic acids are NOT DETECTED.  The SARS-CoV-2 RNA is generally detectable in upper respiratory specimens during the acute phase of infection. The lowest concentration of SARS-CoV-2 viral copies this assay can detect is 138 copies/mL. A negative result does not preclude SARS-Cov-2 infection and should not be  used as the sole basis for treatment or other patient management decisions. A negative result may occur with  improper specimen collection/handling, submission of specimen other than nasopharyngeal swab, presence of viral mutation(s) within the areas targeted by this assay, and inadequate number of viral copies(<138 copies/mL). A negative result must be combined with clinical observations, patient history, and epidemiological information. The expected result is Negative.  Fact Sheet for Patients:  EntrepreneurPulse.com.au  Fact Sheet for Healthcare Providers:   IncredibleEmployment.be  This test is no t yet approved or cleared by the [place] FDA and  has been authorized for detection and/or diagnosis of SARS-CoV-2 by FDA under an Emergency Use Authorization (EUA). This EUA will remain  in effect (meaning this test can be used) for the duration of the COVID-19 declaration under Section 564(b)(1) of the Act, 21 U.S.C.section 360bbb-3(b)(1), unless the authorization is terminated  or revoked sooner.       Influenza A by PCR NEGATIVE NEGATIVE Final   Influenza B by PCR NEGATIVE NEGATIVE Final    Comment: (NOTE) The Xpert Xpress SARS-CoV-2/FLU/RSV plus assay is intended as an aid in the diagnosis of influenza from Nasopharyngeal swab specimens and should not be used as a sole basis for treatment. Nasal washings and aspirates are unacceptable for Xpert Xpress SARS-CoV-2/FLU/RSV testing.  Fact Sheet for Patients: EntrepreneurPulse.com.au  Fact Sheet for Healthcare Providers: IncredibleEmployment.be  This test is not yet approved or cleared by the [place] FDA and has been authorized for detection and/or diagnosis of SARS-CoV-2 by FDA under an Emergency Use Authorization (EUA). This EUA will remain in effect (meaning this test can be used) for the duration of the COVID-19 declaration under Section 564(b)(1) of the Act, 21 U.S.C. section 360bbb-3(b)(1), unless the authorization is terminated or revoked.  Performed at [facility], [address] [place], [place] [address]   Surgical PCR screen     Status: None   Collection Time: 09/29/21  8:13 AM   Specimen: Nasal Mucosa; Nasal Swab  Result Value Ref Range Status   MRSA, PCR NEGATIVE NEGATIVE Final   Staphylococcus aureus NEGATIVE NEGATIVE Final    Comment: (NOTE) The Xpert SA Assay (FDA approved for NASAL specimens in patients 77 years of age and older), is one component of a comprehensive surveillance  program. It is not intended to diagnose infection nor to guide or monitor treatment. Performed at [facility], [address] [place], [place] [address]     Coagulation Studies: No results for input(s): LABPROT, INR in the last 72 hours.  Urinalysis: No results for input(s): COLORURINE, LABSPEC, PHURINE, GLUCOSEU, HGBUR, BILIRUBINUR, KETONESUR, PROTEINUR, UROBILINOGEN, NITRITE, LEUKOCYTESUR in the last 72 hours.  Invalid input(s): APPERANCEUR    Imaging: DG HIP UNILAT WITH PELVIS 2-3 VIEWS LEFT  Result Date: 10/02/2021 CLINICAL DATA:  Left hip pain. EXAM: DG HIP (WITH OR WITHOUT PELVIS) 2-3V LEFT COMPARISON:  Left hip fluoroscopy 09/29/2021 FINDINGS: Interval total left hip arthroplasty. No perihardware lucency is seen to indicate hardware failure or loosening. Mild-to-moderate right femoroacetabular joint space narrowing. There is diffuse decreased bone mineralization. Expected postoperative changes of the left hip including lateral subcutaneous air and lateral surgical skin staples. IMPRESSION: Interval total left hip arthroplasty without evidence of hardware failure. Electronically Signed   By: Yvonne Kendall M.D.   On: 10/02/2021 13:08     Medications:     ceFAZolin (ANCEF) IV      sodium chloride   Intravenous Once   sodium chloride   Intravenous Once  acetaminophen  650 mg Oral Once   Chlorhexidine Gluconate Cloth  6 each Topical Daily   dextrose  1 ampule Intravenous Once   dextrose  1 ampule Intravenous Once   diclofenac  1 patch Transdermal BID   diphenhydrAMINE  25 mg Oral Once   docusate sodium  100 mg Oral BID   feeding supplement (NEPRO CARB STEADY)  237 mL Oral BID BM   ferric citrate  420 mg Oral TID WC   And   ferric citrate  210 mg Oral With snacks   furosemide  20 mg Intravenous Once   insulin aspart  0-5 Units Subcutaneous QHS   insulin aspart  0-6 Units Subcutaneous TID WC   insulin aspart  10 Units Intravenous Once   losartan  50 mg Oral  Daily   magnesium oxide  200 mg Oral BID   methocarbamol  750 mg Oral QID   multivitamin  1 tablet Oral QHS   Vitamin D (Ergocalciferol)  50,000 Units Oral Q7 days   alum & mag hydroxide-simeth, bisacodyl, hydrALAZINE, HYDROcodone-acetaminophen, labetalol, magnesium hydroxide, melatonin, menthol-cetylpyridinium **OR** phenol, ondansetron **OR** ondansetron (ZOFRAN) IV  Assessment/ Plan:  56 y.o. female with past medical history of ESRD on HD undergoing training for HHD, anemia of chronic kidney disease, severe secondary hyperparathyroidism, hypertension, diabetes mellitus type 1, peripheral neuropathy, hyperlipidemia who presents now with left hip fracture.  [facility] D[facility] [place]/MWF/right PermCath/81kg  1.  ESRD with hyperkalemia on dialysis.  She was undergoing treatment training for home hemodialysis.  Training on hold due to fractured hip.  Appreciate vascular evaluating current permacath, manipulation versus exchange.  Plan to dialyze patient after this evaluation.   2.  Anemia of chronic kidney disease.  Hemoglobin slightly improved but remains 6.7 after 1 L RBCs yesterday.  Plans to transfuse 1 additional unit today during dialysis later.  3.  Secondary hyperparathyroidism.  Evaluation for parathyroidectomy scheduled outpatient.  Continue patient on vitamin D 50,000 units weekly.    LOS: [place] 2/3/202311:11 AM

## 2021-10-04 DIAGNOSIS — S72102A Unspecified trochanteric fracture of left femur, initial encounter for closed fracture: Secondary | ICD-10-CM | POA: Diagnosis not present

## 2021-10-04 LAB — GLUCOSE, CAPILLARY
Glucose-Capillary: 161 mg/dL — ABNORMAL HIGH (ref 70–99)
Glucose-Capillary: 250 mg/dL — ABNORMAL HIGH (ref 70–99)
Glucose-Capillary: 160 mg/dL — ABNORMAL HIGH (ref 70–99)
Glucose-Capillary: 193 mg/dL — ABNORMAL HIGH (ref 70–99)

## 2021-10-04 LAB — TYPE AND SCREEN
ABO/RH(D): A POS
Antibody Screen: NEGATIVE
Unit division: 0
Unit division: 0
Unit division: 0

## 2021-10-04 LAB — CBC WITH DIFFERENTIAL/PLATELET
Abs Immature Granulocytes: 0.04 10*3/uL (ref 0.00–0.07)
Basophils Absolute: 0 10*3/uL (ref 0.0–0.1)
Basophils Relative: 1 %
Eosinophils Absolute: 0.1 10*3/uL (ref 0.0–0.5)
Eosinophils Relative: 3 %
HCT: 23.6 % — ABNORMAL LOW (ref 36.0–46.0)
Hemoglobin: 7.8 g/dL — ABNORMAL LOW (ref 12.0–15.0)
Immature Granulocytes: 1 %
Lymphocytes Relative: 10 %
Lymphs Abs: 0.5 10*3/uL — ABNORMAL LOW (ref 0.7–4.0)
MCH: 31.7 pg (ref 26.0–34.0)
MCHC: 33.1 g/dL (ref 30.0–36.0)
MCV: 95.9 fL (ref 80.0–100.0)
Monocytes Absolute: 0.6 10*3/uL (ref 0.1–1.0)
Monocytes Relative: 12 %
Neutro Abs: 3.5 10*3/uL (ref 1.7–7.7)
Neutrophils Relative %: 73 %
Platelets: 173 10*3/uL (ref 150–400)
RBC: 2.46 MIL/uL — ABNORMAL LOW (ref 3.87–5.11)
RDW: 15.7 % — ABNORMAL HIGH (ref 11.5–15.5)
WBC: 4.8 10*3/uL (ref 4.0–10.5)
nRBC: 0 % (ref 0.0–0.2)

## 2021-10-04 LAB — RENAL FUNCTION PANEL
Albumin: 2.8 g/dL — ABNORMAL LOW (ref 3.5–5.0)
Anion gap: 14 (ref 5–15)
BUN: 46 mg/dL — ABNORMAL HIGH (ref 6–20)
CO2: 27 mmol/L (ref 22–32)
Calcium: 9.1 mg/dL (ref 8.9–10.3)
Chloride: 94 mmol/L — ABNORMAL LOW (ref 98–111)
Creatinine, Ser: 5.82 mg/dL — ABNORMAL HIGH (ref 0.44–1.00)
GFR, Estimated: 8 mL/min — ABNORMAL LOW (ref >60)
Glucose, Bld: 149 mg/dL — ABNORMAL HIGH (ref 70–99)
Phosphorus: 5.5 mg/dL — ABNORMAL HIGH (ref 2.5–4.6)
Potassium: 4.5 mmol/L (ref 3.5–5.1)
Sodium: 135 mmol/L (ref 135–145)

## 2021-10-04 LAB — BPAM RBC
Blood Product Expiration Date: 202302012359
Blood Product Expiration Date: 202302132359
Blood Product Expiration Date: 202302272359
ISSUE DATE / TIME: 202302011140
ISSUE DATE / TIME: 202302021120
ISSUE DATE / TIME: 202302031050
Unit Type and Rh: 600
Unit Type and Rh: 6200
Unit Type and Rh: 9500

## 2021-10-04 NOTE — Progress Notes (Signed)
PT Cancellation Note  Patient Details Name: [name] MRN: [id number] DOB: March 30, 1966   Cancelled Treatment:     PT attempted and encouraged participation several times throughout the day. She was observed OOB and sitting EOB 2 separate occasions however each time author made attempt to treat, pt refused due to pain and ms spasm. She was performing there ex one time I'ly in bed upon arriving and is motivated. Will try to treat/progress pt tomorrow. Acute PT will continue current POC.    Willette Pa 10/04/2021, 5:34 PM

## 2021-10-04 NOTE — Progress Notes (Signed)
PROGRESS NOTE  [name]  ZOX:[id number] DOB: 1965/12/12 DOA: 09/28/2021 PCP: Pcp, No    Brief Narrative:  56 year old female with end-stage renal disease on hemodialysis, currently via right IJ, 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   Vitals in the emergency department showed temperature of 97.8, respiration rate of 18, heart rate of 99, blood pressure 186/92, SPO2 of 99% on room air. Labs in the emergency department showed serum sodium 137, potassium 4.5, chloride 94, bicarb 27, BUN of 59, serum creatinine of seven 7.75, nonfasting blood glucose 121, GFR of 6. WBC was 4.6, hemoglobin 10, platelets of 168 COVID/influenza A/influenza B PCR were negative.   ED provider ordered a left unilateral with or without pelvis x-ray and portable chest x-ray. Unilateral hip x-ray showed left femur fracture. Follow-up CT redemonstrated left femur fracture at the femoral neck and associated right pelvic fracture Orthopedics and nephrology on consult Status post left hip anterior hemiarthroplasty on 1/30  Assessment & Plan:   Principal Problem:   Femur fracture, left (HCC) Active Problems:   Dyslipidemia   Secondary hyperparathyroidism of renal origin ([place])   Diabetic neuropathy ([place])   End stage renal disease on dialysis ([place])   Type 1 diabetes mellitus with chronic kidney disease on chronic dialysis [facility])   Essential hypertension  Femur fracture, left (HCC) Status post left hip hemiarthroplasty 1/30 Tylenol first choice, Norco 1 every 4 as needed second choice  Is on TED hose Approved for CIR and will go on 10/06/2021 End stage renal disease on dialysis [facility]) Hyperkalemia Nonfunctional PERM-Cath PErm cath exchanged 10/03/2021--appreciate Dr. Delana Meyer input M-W-T-F for 2 hours each day as  patient is currently being trained to get home hemodialysis for HD today, defer rest to Nephro May want to exercise more fluid restriction at home and consolidate her HD days to 3d/week?  She admits to non compliance on fluid restrition at home She usually is completely "drained " on Sundays after 4 days of HD Anemia of acute blood loss postop expected in the setting of ESRD but recent surgery IV iron given 2/1 Tx 1 U PRBC 10/02/21, and again 10/03/2021--had some extravasation Hemoglobin better Essential hypertension Amlodipine held, continuing losartan 50 mg daily Labetalol injection 5 mg every 2 hours as needed for SBP greater than 170 Type 1 diabetes mellitus with chronic kidney disease on chronic dialysis [facility]) Per endocrinology OV 2022, patient doesn't take glargine 15 units [place] nightly Insulin SSI with at bedtime coverage ordered, end-stage renal disease dosing CBGs ranging 160-250 DM coordinating continuous monitor   ?  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 Holley Raring [ who has been coordinating her Parathyroid surgery for 10/07/2021] will hopefully be able to help with care coordination  DVT prophylaxis: SCD Code Status: Full Family Communication:Husband at bedside daily Disposition Plan: Status is: Inpatient  Remains inpatient appropriate because: Hip fracture.  Postop day 1 status post hemiarthroplasty     Level of care: Med-Surg  Consultants:  Orthopedics Nephrology  Procedures:  Hip fracture repair 1/30  Antimicrobials: None   Subjective:  Coherent awake alert  No distress, some Hip pain earlier but now resting  Objective: Vitals:   10/04/21 0348 10/04/21 0825 10/04/21 0831 10/04/21 1257  BP: 138/66 139/63 (!) 119/57 (!) 151/67  Pulse: (!) 103 94 80 (!) 101  Resp: 15 17 16 18   Temp: 98.3 F (36.8 C) 98.1 F (36.7 C) 98 F (36.7 C) 97.7 F  (36.5 C)  TempSrc:      SpO2: 92% 97% 100% 100%  Weight:      Height:        Intake/Output Summary (Last 24 hours) at 10/04/2021 1504 Last data filed at 10/04/2021 0932 Gross per 24 hour  Intake --  Output 2000 ml  Net -2000 ml    Filed Weights   10/01/21 1043 10/01/21 1200 10/03/21 2000  Weight: 79.2 kg 80.4 kg 79.2 kg    Examination:   EOMI NCAT cushingoid appearance-female pattern baldness  proptosis eyes No thyromegaly Mallampatti 3, poor dentition Neck soft webbed S1-S2 no murmur Chest clear no added sound no rales no rhonchi Slight left lower extremity swelling Patient has some extravasation in the right upper arm where IV site was   Data Reviewed: I have personally reviewed following labs and imaging studies  CBC: Recent Labs  Lab 09/28/21 1556 09/29/21 0630 09/30/21 0645 10/01/21 0329 10/02/21 0417 10/03/21 0352 10/04/21 0447  WBC 4.6   < > 9.1 8.7 7.3 6.3 4.8  NEUTROABS 3.4  --   --   --  5.6 4.6 3.5  HGB 10.0*   < > 7.7* 6.7* 6.3* 6.7* 7.8*  HCT 31.3*   < > 23.7* 20.6* 19.5* 20.5* 23.6*  MCV 101.3*   < > 100.4* 100.0 101.6* 95.3 95.9  PLT 168   < > 189 167 169 183 173   < > = values in this interval not displayed.    Basic Metabolic Panel: Recent Labs  Lab 09/29/21 0630 09/30/21 0645 10/02/21 0417 10/03/21 0352 10/04/21 0447  NA 135 133* 133* 129* 135  K 5.3* 5.6* 6.1* 5.6* 4.5  CL 93* 94* 94* 92* 94*  CO2 25 25 25 24 27   GLUCOSE 130* 162* 183* 168* 149*  BUN 69* 57* 77* 93* 46*  CREATININE 8.78* 7.30* 9.21* 10.39* 5.82*  CALCIUM 9.4 8.4* 9.0 8.9 9.1  MG 2.4  --   --   --   --   PHOS 6.3*  --  6.0* 6.2* 5.5*    GFR: Estimated Creatinine Clearance: 11.5 mL/min (A) (by C-G formula based on SCr of 5.82 mg/dL (H)). Liver Function Tests: Recent Labs  Lab 10/02/21 0417 10/03/21 0352 10/04/21 0447  ALBUMIN 2.9* 3.2* 2.8*    No results for input(s): LIPASE, AMYLASE in the last 168 hours. No results for input(s): AMMONIA in the last  168 hours. Coagulation Profile: No results for input(s): INR, PROTIME in the last 168 hours. Cardiac Enzymes: No results for input(s): CKTOTAL, CKMB, CKMBINDEX, TROPONINI in the last 168 hours. BNP (last 3 results) No results for input(s): PROBNP in the last 8760 hours. HbA1C: No results for input(s): HGBA1C in the last 72 hours.  CBG: Recent Labs  Lab 10/03/21 0731 10/03/21 1105 10/03/21 2316 10/04/21 0827 10/04/21 1255  GLUCAP 159* 143* 255* 161* 250*    Lipid Profile: No results for input(s): CHOL, HDL, LDLCALC, TRIG, CHOLHDL, LDLDIRECT in the last 72 hours. Thyroid Function Tests: No results for input(s): TSH, T4TOTAL, FREET4, T3FREE, THYROIDAB in the last 72 hours.  Anemia Panel: No results for input(s): VITAMINB12, FOLATE, FERRITIN, TIBC, IRON, RETICCTPCT in the last 72 hours.  Sepsis Labs: No results for input(s): PROCALCITON, LATICACIDVEN in the last 168 hours.  Recent Results (from the past 240 hour(s))  Resp Panel by RT-PCR (Flu A&B, Covid) Nasopharyngeal Swab  Status: None   Collection Time: 09/28/21  3:50 PM   Specimen: Nasopharyngeal Swab; Nasopharyngeal(NP) swabs in vial transport medium  Result Value Ref Range Status   SARS Coronavirus 2 by RT PCR NEGATIVE NEGATIVE Final    Comment: (NOTE) SARS-CoV-2 target nucleic acids are NOT DETECTED.  The SARS-CoV-2 RNA is generally detectable in upper respiratory specimens during the acute phase of infection. The lowest concentration of SARS-CoV-2 viral copies this assay can detect is 138 copies/mL. A negative result does not preclude SARS-Cov-2 infection and should not be used as the sole basis for treatment or other patient management decisions. A negative result may occur with  improper specimen collection/handling, submission of specimen other than nasopharyngeal swab, presence of viral mutation(s) within the areas targeted by this assay, and inadequate number of viral copies(<138 copies/mL). A negative  result must be combined with clinical observations, patient history, and epidemiological information. The expected result is Negative.  Fact Sheet for Patients:  EntrepreneurPulse.com.au  Fact Sheet for Healthcare Providers:  IncredibleEmployment.be  This test is no t yet approved or cleared by the [place] FDA and  has been authorized for detection and/or diagnosis of SARS-CoV-2 by FDA under an Emergency Use Authorization (EUA). This EUA will remain  in effect (meaning this test can be used) for the duration of the COVID-19 declaration under Section 564(b)(1) of the Act, 21 U.S.C.section 360bbb-3(b)(1), unless the authorization is terminated  or revoked sooner.       Influenza A by PCR NEGATIVE NEGATIVE Final   Influenza B by PCR NEGATIVE NEGATIVE Final    Comment: (NOTE) The Xpert Xpress SARS-CoV-2/FLU/RSV plus assay is intended as an aid in the diagnosis of influenza from Nasopharyngeal swab specimens and should not be used as a sole basis for treatment. Nasal washings and aspirates are unacceptable for Xpert Xpress SARS-CoV-2/FLU/RSV testing.  Fact Sheet for Patients: EntrepreneurPulse.com.au  Fact Sheet for Healthcare Providers: IncredibleEmployment.be  This test is not yet approved or cleared by the [place] FDA and has been authorized for detection and/or diagnosis of SARS-CoV-2 by FDA under an Emergency Use Authorization (EUA). This EUA will remain in effect (meaning this test can be used) for the duration of the COVID-19 declaration under Section 564(b)(1) of the Act, 21 U.S.C. section 360bbb-3(b)(1), unless the authorization is terminated or revoked.  Performed at [facility], [address], [place], [place] [address]   Surgical PCR screen     Status: None   Collection Time: 09/29/21  8:13 AM   Specimen: Nasal Mucosa; Nasal Swab  Result Value Ref Range Status   MRSA,  PCR NEGATIVE NEGATIVE Final   Staphylococcus aureus NEGATIVE NEGATIVE Final    Comment: (NOTE) The Xpert SA Assay (FDA approved for NASAL specimens in patients 52 years of age and older), is one component of a comprehensive surveillance program. It is not intended to diagnose infection nor to guide or monitor treatment. Performed at [facility], [address], [place], [place] [address]      Radiology Studies: PERIPHERAL VASCULAR CATHETERIZATION  Result Date: 10/03/2021 See surgical note for result.    Scheduled Meds:  sodium chloride   Intravenous Once   acetaminophen  650 mg Oral Once   Chlorhexidine Gluconate Cloth  6 each Topical Daily   dextrose  1 ampule Intravenous Once   dextrose  1 ampule Intravenous Once   diclofenac  1 patch Transdermal BID   diphenhydrAMINE  25 mg Oral Once   docusate sodium  100 mg  Oral BID   feeding supplement (NEPRO CARB STEADY)  237 mL Oral BID BM   ferric citrate  420 mg Oral TID WC   And   ferric citrate  210 mg Oral With snacks   furosemide  20 mg Intravenous Once   insulin aspart  0-5 Units Subcutaneous QHS   insulin aspart  0-6 Units Subcutaneous TID WC   insulin aspart  10 Units Intravenous Once   losartan  50 mg Oral Daily   magnesium oxide  200 mg Oral BID   methocarbamol  750 mg Oral QID   multivitamin  1 tablet Oral QHS   Vitamin D (Ergocalciferol)  50,000 Units Oral Q7 days   Continuous Infusions:     LOS: 6 days    Time spent: 25 minutes    Nita Sells, MD [facility]   If 7PM-7AM, please contact night-coverage  10/04/2021, 3:04 PM

## 2021-10-04 NOTE — Progress Notes (Signed)
[facility]  PROGRESS NOTE   Subjective:   Comfortable. S/p permacath exchange.  Had stable dialysis last night with 2 L fluid removal.  Objective:  Vital signs in last 24 hours:  Temp:  [97.7 F (36.5 C)-98.4 F (36.9 C)] 97.7 F (36.5 C) (02/04 1257) Pulse Rate:  [80-107] 101 (02/04 1257) Resp:  [15-28] 18 (02/04 1257) BP: (106-167)/(56-95) 151/67 (02/04 1257) SpO2:  [92 %-100 %] 100 % (02/04 1257) Weight:  [79.2 kg] 79.2 kg (02/03 2000)  Weight change:  Filed Weights   10/01/21 1043 10/01/21 1200 10/03/21 2000  Weight: 79.2 kg 80.4 kg 79.2 kg    Intake/Output: I/O last 3 completed shifts: In: -  Out: 2000 [Other:2000]   Intake/Output this shift:  No intake/output data recorded.  Physical Exam: General:  No acute distress  Head:  Normocephalic, atraumatic. Moist oral mucosal membranes  Eyes:  Anicteric  Neck:  Supple  Lungs:   Clear to auscultation, normal effort  Heart:  S1S2 no rubs  Abdomen:   Soft, nontender, bowel sounds present  Extremities:  peripheral edema.  Neurologic:  Awake, alert, following commands  Skin:  No lesions  Access:     Basic Metabolic Panel: Recent Labs  Lab 09/29/21 0630 09/30/21 0645 10/02/21 0417 10/03/21 0352 10/04/21 0447  NA 135 133* 133* 129* 135  K 5.3* 5.6* 6.1* 5.6* 4.5  CL 93* 94* 94* 92* 94*  CO2 25 25 25 24 27   GLUCOSE 130* 162* 183* 168* 149*  BUN 69* 57* 77* 93* 46*  CREATININE 8.78* 7.30* 9.21* 10.39* 5.82*  CALCIUM 9.4 8.4* 9.0 8.9 9.1  MG 2.4  --   --   --   --   PHOS 6.3*  --  6.0* 6.2* 5.5*    CBC: Recent Labs  Lab 09/28/21 1556 09/29/21 0630 09/30/21 0645 10/01/21 0329 10/02/21 0417 10/03/21 0352 10/04/21 0447  WBC 4.6   < > 9.1 8.7 7.3 6.3 4.8  NEUTROABS 3.4  --   --   --  5.6 4.6 3.5  HGB 10.0*   < > 7.7* 6.7* 6.3* 6.7* 7.8*  HCT 31.3*   < > 23.7* 20.6* 19.5* 20.5* 23.6*  MCV 101.3*   < > 100.4* 100.0 101.6* 95.3 95.9  PLT 168   < > 189 167 169 183 173   < > = values in  this interval not displayed.     Urinalysis: No results for input(s): COLORURINE, LABSPEC, PHURINE, GLUCOSEU, HGBUR, BILIRUBINUR, KETONESUR, PROTEINUR, UROBILINOGEN, NITRITE, LEUKOCYTESUR in the last 72 hours.  Invalid input(s): APPERANCEUR    Imaging: PERIPHERAL VASCULAR CATHETERIZATION  Result Date: 10/03/2021 See surgical note for result.    Medications:     sodium chloride   Intravenous Once   acetaminophen  650 mg Oral Once   Chlorhexidine Gluconate Cloth  6 each Topical Daily   dextrose  1 ampule Intravenous Once   dextrose  1 ampule Intravenous Once   diclofenac  1 patch Transdermal BID   diphenhydrAMINE  25 mg Oral Once   docusate sodium  100 mg Oral BID   feeding supplement (NEPRO CARB STEADY)  237 mL Oral BID BM   ferric citrate  420 mg Oral TID WC   And   ferric citrate  210 mg Oral With snacks   furosemide  20 mg Intravenous Once   insulin aspart  0-5 Units Subcutaneous QHS   insulin aspart  0-6 Units Subcutaneous TID WC   insulin aspart  10 Units Intravenous  Once   losartan  50 mg Oral Daily   magnesium oxide  200 mg Oral BID   methocarbamol  750 mg Oral QID   multivitamin  1 tablet Oral QHS   Vitamin D (Ergocalciferol)  50,000 Units Oral Q7 days    Assessment/ Plan:     Principal Problem:   Femur fracture, left (HCC) Active Problems:   Dyslipidemia   Secondary hyperparathyroidism of renal origin ([place])   Diabetic neuropathy ([place])   End stage renal disease on dialysis ([place])   Type 1 diabetes mellitus with chronic kidney disease on chronic dialysis [facility])   Essential hypertension  56 year old female with history of hypertension, coronary artery disease, diabetes, peripheral vascular disease, hyperlipidemia, end-stage renal disease on dialysis.  Patient admitted with history of left hip fracture s/p surgery.  #1: ESRD: Patient is on a Monday Wednesday Friday schedule.  Had stable dialysis.  #2: Permacath: Patient had nonfunctional permacath which was  exchanged.  #3: Left hip fracture: Patient is s/p hemiarthroplasty on 1/30  #4: Anemia: Anemia secondary to chronic kidney disease.  She is on the protocol.  She is s/p 1 unit of PRBC.  #5: Hypertension: Continue antihypertensive medications as ordered.  Will continue to follow closely.   LOS: [place], [facility] 2/4/20232:01 PM

## 2021-10-05 DIAGNOSIS — S72102A Unspecified trochanteric fracture of left femur, initial encounter for closed fracture: Secondary | ICD-10-CM | POA: Diagnosis not present

## 2021-10-05 LAB — GLUCOSE, CAPILLARY
Glucose-Capillary: 158 mg/dL — ABNORMAL HIGH (ref 70–99)
Glucose-Capillary: 211 mg/dL — ABNORMAL HIGH (ref 70–99)
Glucose-Capillary: 146 mg/dL — ABNORMAL HIGH (ref 70–99)

## 2021-10-05 NOTE — Progress Notes (Signed)
PROGRESS NOTE  [name]  TWS:[id number] DOB: 08/17/1966 DOA: 09/28/2021 PCP: Pcp, No    Brief Narrative:  56 year old female with end-stage renal disease on hemodialysis, currently via right IJ, 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   Vitals in the emergency department showed temperature of 97.8, respiration rate of 18, heart rate of 99, blood pressure 186/92, SPO2 of 99% on room air. Labs in the emergency department showed serum sodium 137, potassium 4.5, chloride 94, bicarb 27, BUN of 59, serum creatinine of seven 7.75, nonfasting blood glucose 121, GFR of 6. WBC was 4.6, hemoglobin 10, platelets of 168 COVID/influenza A/influenza B PCR were negative.   ED provider ordered a left unilateral with or without pelvis x-ray and portable chest x-ray. Unilateral hip x-ray showed left femur fracture. Follow-up CT redemonstrated left femur fracture at the femoral neck and associated right pelvic fracture Orthopedics and nephrology on consult Status post left hip anterior hemiarthroplasty on 1/30  Await CIR bed  Assessment & Plan:   Principal Problem:   Femur fracture, left (HCC) Active Problems:   Dyslipidemia   Secondary hyperparathyroidism of renal origin ([place])   Diabetic neuropathy ([place])   End stage renal disease on dialysis ([place])   Type 1 diabetes mellitus with chronic kidney disease on chronic dialysis [facility])   Essential hypertension  Femur fracture, left (HCC) Status post left hip hemiarthroplasty 1/30 Tylenol first choice, Norco 1 every 4 as needed second choice  Is on TED hose Approved for CIR and will go on 10/06/2021? End stage renal disease on dialysis [facility]) Hyperkalemia Nonfunctional PERM-Cath Perm cath exchanged 10/03/2021--appreciate Dr. Delana Meyer input M-W-T-F for 2  hours -training to get home hemodialysis for HD per Nephro fluid restriction= consolidate her HD days to 3d/week?  admits to non compliance on fluid restrition at home She usually is completely "drained " on Sundays after 4 days of HD Anemia of acute blood loss postop expected in the setting of ESRD but recent surgery IV iron given 2/1 Tx 1 U PRBC 10/02/21, and again 10/03/2021--had some extravasation Hemoglobin rpt am with HD Essential hypertension Amlodipine held, continuing losartan 50 mg daily Labetalol injection 5 mg every 2 hours as needed for SBP greater than 170 Type 1 diabetes mellitus with chronic kidney disease on chronic dialysis [facility]) Per endocrinology OV 2022, patient doesn't take glargine 15 units [place] nightly Insulin SSI with at bedtime coverage ordered, end-stage renal disease dosing CBGs ranging 146-211 DM coordinating continuous monitor   ?  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 Holley Raring [ who has been coordinating her Parathyroid surgery for 10/07/2021] will hopefully be able to help with care coordination  DVT prophylaxis: SCD Code Status: Full Family Communication: none + today Disposition Plan: Status is: Inpatient  Remains inpatient appropriate because: Hip fracture  Level of care: Med-Surg  Consultants:  Orthopedics Nephrology  Procedures:  Hip fracture repair 1/30  Antimicrobials: None   Subjective:  Coherent awake alert  Chest pain resolved-no fever 2 assist with RN earlier today No fever Happy to be going to CIR hopefully in am   Objective: Vitals:   10/05/21 0354 10/05/21 0736 10/05/21 1112 10/05/21 1512  BP: 133/62 (!) 152/76 (!) 159/70 (!) 144/82  Pulse: 87 91 94 92  Resp: 16 15 17 18   Temp: 98 F (36.7  C) 97.8 F (36.6 C) 97.7 F (36.5 C) 97.9 F (36.6 C)  TempSrc:  Oral    SpO2: 94% 96% 98% 100%  Weight:       Height:        Intake/Output Summary (Last 24 hours) at 10/05/2021 1717 Last data filed at 10/04/2021 1800 Gross per 24 hour  Intake 120 ml  Output --  Net 120 ml    Filed Weights   10/01/21 1043 10/01/21 1200 10/03/21 2000  Weight: 79.2 kg 80.4 kg 79.2 kg   Examination:   EOMI NCAT cushingoid appearance-female pattern baldness proptosis eyes No thyromegaly Mallampatti 3, poor dentition S1-S2 no murmur Chest clear no added sound no rales no rhonchi Slight left lower extremity swelling IV sit R arm improved--Surgical site not viewed today on LLE   Data Reviewed: I have personally reviewed following labs and imaging studies  CBC: Recent Labs  Lab 09/30/21 0645 10/01/21 0329 10/02/21 0417 10/03/21 0352 10/04/21 0447  WBC 9.1 8.7 7.3 6.3 4.8  NEUTROABS  --   --  5.6 4.6 3.5  HGB 7.7* 6.7* 6.3* 6.7* 7.8*  HCT 23.7* 20.6* 19.5* 20.5* 23.6*  MCV 100.4* 100.0 101.6* 95.3 95.9  PLT 189 167 169 183 956    Basic Metabolic Panel: Recent Labs  Lab 09/29/21 0630 09/30/21 0645 10/02/21 0417 10/03/21 0352 10/04/21 0447  NA 135 133* 133* 129* 135  K 5.3* 5.6* 6.1* 5.6* 4.5  CL 93* 94* 94* 92* 94*  CO2 25 25 25 24 27   GLUCOSE 130* 162* 183* 168* 149*  BUN 69* 57* 77* 93* 46*  CREATININE 8.78* 7.30* 9.21* 10.39* 5.82*  CALCIUM 9.4 8.4* 9.0 8.9 9.1  MG 2.4  --   --   --   --   PHOS 6.3*  --  6.0* 6.2* 5.5*    GFR: Estimated Creatinine Clearance: 11.5 mL/min (A) (by C-G formula based on SCr of 5.82 mg/dL (H)). Liver Function Tests: Recent Labs  Lab 10/02/21 0417 10/03/21 0352 10/04/21 0447  ALBUMIN 2.9* 3.2* 2.8*    No results for input(s): LIPASE, AMYLASE in the last 168 hours. No results for input(s): AMMONIA in the last 168 hours. Coagulation Profile: No results for input(s): INR, PROTIME in the last 168 hours. Cardiac Enzymes: No results for input(s): CKTOTAL, CKMB, CKMBINDEX, TROPONINI in the last 168 hours. BNP (last 3 results) No results for  input(s): PROBNP in the last 8760 hours. HbA1C: No results for input(s): HGBA1C in the last 72 hours.  CBG: Recent Labs  Lab 10/04/21 1603 10/04/21 2141 10/05/21 0738 10/05/21 1113 10/05/21 1655  GLUCAP 160* 193* 158* 211* 146*    Lipid Profile: No results for input(s): CHOL, HDL, LDLCALC, TRIG, CHOLHDL, LDLDIRECT in the last 72 hours. Thyroid Function Tests: No results for input(s): TSH, T4TOTAL, FREET4, T3FREE, THYROIDAB in the last 72 hours.  Anemia Panel: No results for input(s): VITAMINB12, FOLATE, FERRITIN, TIBC, IRON, RETICCTPCT in the last 72 hours.  Sepsis Labs: No results for input(s): PROCALCITON, LATICACIDVEN in the last 168 hours.  Recent Results (from the past 240 hour(s))  Resp Panel by RT-PCR (Flu A&B, Covid) Nasopharyngeal Swab     Status: None   Collection Time: 09/28/21  3:50 PM   Specimen: Nasopharyngeal Swab; Nasopharyngeal(NP) swabs in vial transport medium  Result Value Ref Range Status   SARS Coronavirus 2 by RT PCR NEGATIVE NEGATIVE Final    Comment: (NOTE) SARS-CoV-2 target nucleic acids are NOT DETECTED.  The SARS-CoV-2 RNA is generally detectable  in upper respiratory specimens during the acute phase of infection. The lowest concentration of SARS-CoV-2 viral copies this assay can detect is 138 copies/mL. A negative result does not preclude SARS-Cov-2 infection and should not be used as the sole basis for treatment or other patient management decisions. A negative result may occur with  improper specimen collection/handling, submission of specimen other than nasopharyngeal swab, presence of viral mutation(s) within the areas targeted by this assay, and inadequate number of viral copies(<138 copies/mL). A negative result must be combined with clinical observations, patient history, and epidemiological information. The expected result is Negative.  Fact Sheet for Patients:  EntrepreneurPulse.com.au  Fact Sheet for Healthcare  Providers:  IncredibleEmployment.be  This test is no t yet approved or cleared by the [place] FDA and  has been authorized for detection and/or diagnosis of SARS-CoV-2 by FDA under an Emergency Use Authorization (EUA). This EUA will remain  in effect (meaning this test can be used) for the duration of the COVID-19 declaration under Section 564(b)(1) of the Act, 21 U.S.C.section 360bbb-3(b)(1), unless the authorization is terminated  or revoked sooner.       Influenza A by PCR NEGATIVE NEGATIVE Final   Influenza B by PCR NEGATIVE NEGATIVE Final    Comment: (NOTE) The Xpert Xpress SARS-CoV-2/FLU/RSV plus assay is intended as an aid in the diagnosis of influenza from Nasopharyngeal swab specimens and should not be used as a sole basis for treatment. Nasal washings and aspirates are unacceptable for Xpert Xpress SARS-CoV-2/FLU/RSV testing.  Fact Sheet for Patients: EntrepreneurPulse.com.au  Fact Sheet for Healthcare Providers: IncredibleEmployment.be  This test is not yet approved or cleared by the [place] FDA and has been authorized for detection and/or diagnosis of SARS-CoV-2 by FDA under an Emergency Use Authorization (EUA). This EUA will remain in effect (meaning this test can be used) for the duration of the COVID-19 declaration under Section 564(b)(1) of the Act, 21 U.S.C. section 360bbb-3(b)(1), unless the authorization is terminated or revoked.  Performed at [facility], [address], [place], [place] [address]   Surgical PCR screen     Status: None   Collection Time: 09/29/21  8:13 AM   Specimen: Nasal Mucosa; Nasal Swab  Result Value Ref Range Status   MRSA, PCR NEGATIVE NEGATIVE Final   Staphylococcus aureus NEGATIVE NEGATIVE Final    Comment: (NOTE) The Xpert SA Assay (FDA approved for NASAL specimens in patients 3 years of age and older), is one component of a  comprehensive surveillance program. It is not intended to diagnose infection nor to guide or monitor treatment. Performed at [facility], [address] [place], [place] [address]      Radiology Studies: No results found.   Scheduled Meds:  sodium chloride   Intravenous Once   acetaminophen  650 mg Oral Once   Chlorhexidine Gluconate Cloth  6 each Topical Daily   dextrose  1 ampule Intravenous Once   dextrose  1 ampule Intravenous Once   diclofenac  1 patch Transdermal BID   diphenhydrAMINE  25 mg Oral Once   docusate sodium  100 mg Oral BID   feeding supplement (NEPRO CARB STEADY)  237 mL Oral BID BM   ferric citrate  420 mg Oral TID WC   And   ferric citrate  210 mg Oral With snacks   furosemide  20 mg Intravenous Once   insulin aspart  0-5 Units Subcutaneous QHS   insulin aspart  0-6 Units Subcutaneous TID WC  insulin aspart  10 Units Intravenous Once   losartan  50 mg Oral Daily   magnesium oxide  200 mg Oral BID   methocarbamol  750 mg Oral QID   multivitamin  1 tablet Oral QHS   Vitamin D (Ergocalciferol)  50,000 Units Oral Q7 days   Continuous Infusions:   LOS: 7 days    Time spent: 25 minutes  Nita Sells, MD [facility]   If 7PM-7AM, please contact night-coverage  10/05/2021, 5:17 PM

## 2021-10-05 NOTE — Progress Notes (Signed)
Physical Therapy Treatment Patient Details Name: [name] MRN: [id number] DOB: [birth year] Today's Date: 10/05/2021   History of Present Illness presented to ER secondary to L hip pain; noted with R inferior pubic ramus frature (WBAT), L femoral neck fracture s/p L hemiarthroplasty (WBAT), 09/29/21.    PT Comments    Pt sitting EOB with RN having difficulty with transfer to [facility].  Assist provided.  She is able to stand with mod a x 2 but requires mod/max a x 2 to complete transfer to commode.  Increased time and physical cues to stand up fully and to move LLE.  She puts very limited weight on LLE.  She is able to void but no BM noted.  Care is provided.  She does sit on EOB per her request after session and stated she is able to get up/down on her own with increased time.  Sat with pt and discussed HEP which she does on her own frequently during the day. Reviewed and taught new ex as appropriate.  Encouragement given.  Pt is excited to transition to  CIR.   Recommendations for follow up therapy are one component of a multi-disciplinary discharge planning process, led by the attending physician.  Recommendations may be updated based on patient status, additional functional criteria and insurance authorization.  Follow Up Recommendations  Acute inpatient rehab (3hours/day)     Assistance Recommended at Discharge    Patient can return home with the following Two people to help with walking and/or transfers;Two people to help with bathing/dressing/bathroom;Help with stairs or ramp for entrance   Equipment Recommendations  Rolling walker (2 wheels);BSC/3in1    Recommendations for Other Services       Precautions / Restrictions Precautions Precautions: Fall Precaution Comments: L AVF, R IJ perm-cath ( will need new dialysis cath) Restrictions Weight Bearing Restrictions: Yes RLE Weight Bearing: Weight bearing as tolerated LLE Weight Bearing: Weight bearing as tolerated     Mobility   Bed Mobility Overal bed mobility: Needs Assistance Bed Mobility: Supine to Sit     Supine to sit: Min guard     General bed mobility comments: increased time but with leg lifter/gait belt she is able to manage on her own    Transfers Overall transfer level: Needs assistance Equipment used: Rolling walker (2 wheels) Transfers: Sit to/from Stand Sit to Stand: Mod assist, +2 physical assistance   Step pivot transfers: Mod assist, Max assist, +2 physical assistance       General transfer comment: able to transfert to/from commode with heavy assist and cues with physical assist to move LLE    Ambulation/Gait Ambulation/Gait assistance: Mod assist, Max assist Gait Distance (Feet): 3 Feet Assistive device: Rolling walker (2 wheels) Gait Pattern/deviations: Step-to pattern Gait velocity: decreased     General Gait Details: unsafe to progress further than transfers   Stairs             Wheelchair Mobility    Modified Rankin (Stroke Patients Only)       Balance Overall balance assessment: Needs assistance Sitting-balance support: No upper extremity supported, Feet supported Sitting balance-Leahy Scale: Good     Standing balance support: Bilateral upper extremity supported Standing balance-Leahy Scale: Poor                              Cognition Arousal/Alertness: Awake/alert Behavior During Therapy: Anxious, WFL for tasks assessed/performed Overall Cognitive Status: Within Functional Limits for tasks assessed  Exercises Other Exercises Other Exercises: seated AROM and review/education of HEP and education/techniques reviewed.    General Comments        Pertinent Vitals/Pain Pain Assessment Pain Assessment: Faces Faces Pain Scale: Hurts even more Pain Location: L groin/thigh pain Pain Descriptors / Indicators: Aching, Grimacing, Guarding Pain Intervention(s): Limited activity  within patient's tolerance, Monitored during session, Premedicated before session, Repositioned    Home Living                          Prior Function            PT Goals (current goals can now be found in the care plan section) Progress towards PT goals: Progressing toward goals    Frequency    7X/week      PT Plan Current plan remains appropriate    Co-evaluation              AM-PAC PT "6 Clicks" Mobility   Outcome Measure  Help needed turning from your back to your side while in a flat bed without using bedrails?: A Little Help needed moving from lying on your back to sitting on the side of a flat bed without using bedrails?: A Lot Help needed moving to and from a bed to a chair (including a wheelchair)?: A Lot Help needed standing up from a chair using your arms (e.g., wheelchair or bedside chair)?: A Lot Help needed to walk in hospital room?: Total Help needed climbing 3-5 steps with a railing? : Total 6 Click Score: 11    End of Session Equipment Utilized During Treatment: Gait belt Activity Tolerance: Patient tolerated treatment well Patient left: in bed;with call bell/phone within reach;with bed alarm set Nurse Communication: Mobility status PT Visit Diagnosis: Muscle weakness (generalized) (M62.81);Other abnormalities of gait and mobility (R26.89);Pain Pain - Right/Left: Left Pain - part of body: Hip     Time: 1696-7893 PT Time Calculation (min) (ACUTE ONLY): 38 min  Charges:  $Therapeutic Exercise: 8-22 mins $Therapeutic Activity: 23-37 mins                    Chesley Noon, PTA 10/05/21, 1:25 PM

## 2021-10-05 NOTE — Progress Notes (Signed)
[facility]  PROGRESS NOTE   Subjective:   Comfortable. S/p permacath exchange.  Had stable dialysis friday night with 2 L fluid removal  Objective:  Vital signs in last 24 hours:  Temp:  [97.7 F (36.5 C)-98.1 F (36.7 C)] 97.7 F (36.5 C) (02/05 1112) Pulse Rate:  [87-97] 94 (02/05 1112) Resp:  [14-17] 17 (02/05 1112) BP: (116-159)/(51-76) 159/70 (02/05 1112) SpO2:  [94 %-98 %] 98 % (02/05 1112)  Weight change:  Filed Weights   10/01/21 1043 10/01/21 1200 10/03/21 2000  Weight: 79.2 kg 80.4 kg 79.2 kg    Intake/Output: I/O last 3 completed shifts: In: 240 [P.O.:240] Out: 2000 [Other:2000]   Intake/Output this shift:  No intake/output data recorded.  Physical Exam: General:  No acute distress  Head:  Normocephalic, atraumatic. Moist oral mucosal membranes  Eyes:  Anicteric  Neck:  Supple  Lungs:   Clear to auscultation, normal effort  Heart:  S1S2 no rubs  Abdomen:   Soft, nontender, bowel sounds present  Extremities:  peripheral edema.  Neurologic:  Awake, alert, following commands  Skin:  No lesions  Access:     Basic Metabolic Panel: Recent Labs  Lab 09/29/21 0630 09/30/21 0645 10/02/21 0417 10/03/21 0352 10/04/21 0447  NA 135 133* 133* 129* 135  K 5.3* 5.6* 6.1* 5.6* 4.5  CL 93* 94* 94* 92* 94*  CO2 25 25 25 24 27   GLUCOSE 130* 162* 183* 168* 149*  BUN 69* 57* 77* 93* 46*  CREATININE 8.78* 7.30* 9.21* 10.39* 5.82*  CALCIUM 9.4 8.4* 9.0 8.9 9.1  MG 2.4  --   --   --   --   PHOS 6.3*  --  6.0* 6.2* 5.5*    CBC: Recent Labs  Lab 09/28/21 1556 09/29/21 0630 09/30/21 0645 10/01/21 0329 10/02/21 0417 10/03/21 0352 10/04/21 0447  WBC 4.6   < > 9.1 8.7 7.3 6.3 4.8  NEUTROABS 3.4  --   --   --  5.6 4.6 3.5  HGB 10.0*   < > 7.7* 6.7* 6.3* 6.7* 7.8*  HCT 31.3*   < > 23.7* 20.6* 19.5* 20.5* 23.6*  MCV 101.3*   < > 100.4* 100.0 101.6* 95.3 95.9  PLT 168   < > 189 167 169 183 173   < > = values in this interval not displayed.      Urinalysis: No results for input(s): COLORURINE, LABSPEC, PHURINE, GLUCOSEU, HGBUR, BILIRUBINUR, KETONESUR, PROTEINUR, UROBILINOGEN, NITRITE, LEUKOCYTESUR in the last 72 hours.  Invalid input(s): APPERANCEUR    Imaging: PERIPHERAL VASCULAR CATHETERIZATION  Result Date: 10/03/2021 See surgical note for result.    Medications:     sodium chloride   Intravenous Once   acetaminophen  650 mg Oral Once   Chlorhexidine Gluconate Cloth  6 each Topical Daily   dextrose  1 ampule Intravenous Once   dextrose  1 ampule Intravenous Once   diclofenac  1 patch Transdermal BID   diphenhydrAMINE  25 mg Oral Once   docusate sodium  100 mg Oral BID   feeding supplement (NEPRO CARB STEADY)  237 mL Oral BID BM   ferric citrate  420 mg Oral TID WC   And   ferric citrate  210 mg Oral With snacks   furosemide  20 mg Intravenous Once   insulin aspart  0-5 Units Subcutaneous QHS   insulin aspart  0-6 Units Subcutaneous TID WC   insulin aspart  10 Units Intravenous Once   losartan  50 mg Oral  Daily   magnesium oxide  200 mg Oral BID   methocarbamol  750 mg Oral QID   multivitamin  1 tablet Oral QHS   Vitamin D (Ergocalciferol)  50,000 Units Oral Q7 days    Assessment/ Plan:     Principal Problem:   Femur fracture, left (HCC) Active Problems:   Dyslipidemia   Secondary hyperparathyroidism of renal origin ([place])   Diabetic neuropathy ([place])   End stage renal disease on dialysis ([place])   Type 1 diabetes mellitus with chronic kidney disease on chronic dialysis [facility])   Essential hypertension  56 year old female with history of hypertension, coronary artery disease, diabetes, peripheral vascular disease, hyperlipidemia, end-stage renal disease on dialysis.  Patient admitted with history of left hip fracture s/p surgery.   #1: ESRD: Patient is on a Monday Wednesday Friday schedule.  Had stable dialysis on Friday where the permacath..   #2: Permacath: Patient had nonfunctional permacath which  was exchanged.   #3: Left hip fracture: Patient is s/p hemiarthroplasty on 1/30.  Scheduled for rehab placement tomorrow.   #4: Anemia: Anemia secondary to chronic kidney disease.  She is on the protocol.  She is s/p 1 unit of PRBC.   #5: Hypertension: Continue antihypertensive medications as ordered.   Will continue to follow closely.   LOS: [place], [facility] 2/5/20232:02 PM

## 2021-10-06 ENCOUNTER — Encounter: Payer: Self-pay | Admitting: Vascular Surgery

## 2021-10-06 ENCOUNTER — Inpatient Hospital Stay (HOSPITAL_COMMUNITY)
Admission: RE | Admit: 2021-10-06 | Discharge: 2021-10-16 | DRG: 559 | Disposition: A | Discharge status: Home Health | Payer: Medicare Other | Source: Other Acute Inpatient Hospital | Attending: Physical Medicine and Rehabilitation | Admitting: Physical Medicine and Rehabilitation

## 2021-10-06 DIAGNOSIS — Z6829 Body mass index (BMI) 29.0-29.9, adult: Secondary | ICD-10-CM

## 2021-10-06 DIAGNOSIS — S72002D Fracture of unspecified part of neck of left femur, subsequent encounter for closed fracture with routine healing: Secondary | ICD-10-CM

## 2021-10-06 DIAGNOSIS — R5381 Other malaise: Secondary | ICD-10-CM | POA: Diagnosis present

## 2021-10-06 DIAGNOSIS — N2581 Secondary hyperparathyroidism of renal origin: Secondary | ICD-10-CM | POA: Diagnosis present

## 2021-10-06 DIAGNOSIS — Z8673 Personal history of transient ischemic attack (TIA), and cerebral infarction without residual deficits: Secondary | ICD-10-CM

## 2021-10-06 DIAGNOSIS — N186 End stage renal disease: Secondary | ICD-10-CM | POA: Diagnosis present

## 2021-10-06 DIAGNOSIS — Z809 Family history of malignant neoplasm, unspecified: Secondary | ICD-10-CM

## 2021-10-06 DIAGNOSIS — E785 Hyperlipidemia, unspecified: Secondary | ICD-10-CM | POA: Diagnosis present

## 2021-10-06 DIAGNOSIS — K219 Gastro-esophageal reflux disease without esophagitis: Secondary | ICD-10-CM | POA: Diagnosis present

## 2021-10-06 DIAGNOSIS — M25462 Effusion, left knee: Secondary | ICD-10-CM | POA: Diagnosis present

## 2021-10-06 DIAGNOSIS — D649 Anemia, unspecified: Secondary | ICD-10-CM

## 2021-10-06 DIAGNOSIS — S7290XA Unspecified fracture of unspecified femur, initial encounter for closed fracture: Secondary | ICD-10-CM | POA: Diagnosis present

## 2021-10-06 DIAGNOSIS — S72002S Fracture of unspecified part of neck of left femur, sequela: Secondary | ICD-10-CM | POA: Diagnosis not present

## 2021-10-06 DIAGNOSIS — E1142 Type 2 diabetes mellitus with diabetic polyneuropathy: Secondary | ICD-10-CM | POA: Diagnosis present

## 2021-10-06 DIAGNOSIS — M62838 Other muscle spasm: Secondary | ICD-10-CM | POA: Diagnosis present

## 2021-10-06 DIAGNOSIS — E1122 Type 2 diabetes mellitus with diabetic chronic kidney disease: Secondary | ICD-10-CM | POA: Diagnosis present

## 2021-10-06 DIAGNOSIS — K567 Ileus, unspecified: Secondary | ICD-10-CM

## 2021-10-06 DIAGNOSIS — S76111D Strain of right quadriceps muscle, fascia and tendon, subsequent encounter: Secondary | ICD-10-CM

## 2021-10-06 DIAGNOSIS — S72001A Fracture of unspecified part of neck of right femur, initial encounter for closed fracture: Secondary | ICD-10-CM | POA: Diagnosis present

## 2021-10-06 DIAGNOSIS — F419 Anxiety disorder, unspecified: Secondary | ICD-10-CM | POA: Diagnosis present

## 2021-10-06 DIAGNOSIS — Z794 Long term (current) use of insulin: Secondary | ICD-10-CM

## 2021-10-06 DIAGNOSIS — E1169 Type 2 diabetes mellitus with other specified complication: Secondary | ICD-10-CM

## 2021-10-06 DIAGNOSIS — S72102A Unspecified trochanteric fracture of left femur, initial encounter for closed fracture: Secondary | ICD-10-CM | POA: Diagnosis not present

## 2021-10-06 DIAGNOSIS — E559 Vitamin D deficiency, unspecified: Secondary | ICD-10-CM | POA: Diagnosis present

## 2021-10-06 DIAGNOSIS — S76121A Laceration of right quadriceps muscle, fascia and tendon, initial encounter: Secondary | ICD-10-CM | POA: Diagnosis present

## 2021-10-06 DIAGNOSIS — S32591D Other specified fracture of right pubis, subsequent encounter for fracture with routine healing: Principal | ICD-10-CM

## 2021-10-06 DIAGNOSIS — Z992 Dependence on renal dialysis: Secondary | ICD-10-CM

## 2021-10-06 DIAGNOSIS — Z8249 Family history of ischemic heart disease and other diseases of the circulatory system: Secondary | ICD-10-CM

## 2021-10-06 DIAGNOSIS — M80052A Age-related osteoporosis with current pathological fracture, left femur, initial encounter for fracture: Secondary | ICD-10-CM | POA: Diagnosis not present

## 2021-10-06 DIAGNOSIS — R52 Pain, unspecified: Secondary | ICD-10-CM

## 2021-10-06 DIAGNOSIS — K5900 Constipation, unspecified: Secondary | ICD-10-CM | POA: Diagnosis present

## 2021-10-06 DIAGNOSIS — S76111A Strain of right quadriceps muscle, fascia and tendon, initial encounter: Secondary | ICD-10-CM | POA: Diagnosis present

## 2021-10-06 DIAGNOSIS — D6489 Other specified anemias: Secondary | ICD-10-CM | POA: Diagnosis present

## 2021-10-06 DIAGNOSIS — R159 Full incontinence of feces: Secondary | ICD-10-CM | POA: Diagnosis present

## 2021-10-06 DIAGNOSIS — Z91013 Allergy to seafood: Secondary | ICD-10-CM

## 2021-10-06 DIAGNOSIS — Z91041 Radiographic dye allergy status: Secondary | ICD-10-CM

## 2021-10-06 DIAGNOSIS — Z90710 Acquired absence of both cervix and uterus: Secondary | ICD-10-CM

## 2021-10-06 DIAGNOSIS — R609 Edema, unspecified: Secondary | ICD-10-CM

## 2021-10-06 DIAGNOSIS — Z79899 Other long term (current) drug therapy: Secondary | ICD-10-CM

## 2021-10-06 DIAGNOSIS — I120 Hypertensive chronic kidney disease with stage 5 chronic kidney disease or end stage renal disease: Secondary | ICD-10-CM | POA: Diagnosis present

## 2021-10-06 DIAGNOSIS — E669 Obesity, unspecified: Secondary | ICD-10-CM | POA: Diagnosis present

## 2021-10-06 DIAGNOSIS — Z96642 Presence of left artificial hip joint: Secondary | ICD-10-CM | POA: Diagnosis present

## 2021-10-06 DIAGNOSIS — W1830XD Fall on same level, unspecified, subsequent encounter: Secondary | ICD-10-CM

## 2021-10-06 DIAGNOSIS — Z833 Family history of diabetes mellitus: Secondary | ICD-10-CM

## 2021-10-06 LAB — RENAL FUNCTION PANEL
Albumin: 2.9 g/dL — ABNORMAL LOW (ref 3.5–5.0)
Anion gap: 13 (ref 5–15)
BUN: 71 mg/dL — ABNORMAL HIGH (ref 6–20)
CO2: 24 mmol/L (ref 22–32)
Calcium: 9.1 mg/dL (ref 8.9–10.3)
Chloride: 94 mmol/L — ABNORMAL LOW (ref 98–111)
Creatinine, Ser: 9.02 mg/dL — ABNORMAL HIGH (ref 0.44–1.00)
GFR, Estimated: 5 mL/min — ABNORMAL LOW (ref >60)
Glucose, Bld: 172 mg/dL — ABNORMAL HIGH (ref 70–99)
Phosphorus: 5.9 mg/dL — ABNORMAL HIGH (ref 2.5–4.6)
Potassium: 5.1 mmol/L (ref 3.5–5.1)
Sodium: 131 mmol/L — ABNORMAL LOW (ref 135–145)

## 2021-10-06 LAB — CBC WITH DIFFERENTIAL/PLATELET
Abs Immature Granulocytes: 0.05 10*3/uL (ref 0.00–0.07)
Basophils Absolute: 0 10*3/uL (ref 0.0–0.1)
Basophils Relative: 1 %
Eosinophils Absolute: 0.2 10*3/uL (ref 0.0–0.5)
Eosinophils Relative: 3 %
HCT: 23.5 % — ABNORMAL LOW (ref 36.0–46.0)
Hemoglobin: 7.7 g/dL — ABNORMAL LOW (ref 12.0–15.0)
Immature Granulocytes: 1 %
Lymphocytes Relative: 8 %
Lymphs Abs: 0.5 10*3/uL — ABNORMAL LOW (ref 0.7–4.0)
MCH: 31.3 pg (ref 26.0–34.0)
MCHC: 32.8 g/dL (ref 30.0–36.0)
MCV: 95.5 fL (ref 80.0–100.0)
Monocytes Absolute: 0.6 10*3/uL (ref 0.1–1.0)
Monocytes Relative: 9 %
Neutro Abs: 5 10*3/uL (ref 1.7–7.7)
Neutrophils Relative %: 78 %
Platelets: 188 10*3/uL (ref 150–400)
RBC: 2.46 MIL/uL — ABNORMAL LOW (ref 3.87–5.11)
RDW: 15.3 % (ref 11.5–15.5)
WBC: 6.3 10*3/uL (ref 4.0–10.5)
nRBC: 0 % (ref 0.0–0.2)

## 2021-10-06 LAB — GLUCOSE, CAPILLARY
Glucose-Capillary: 135 mg/dL — ABNORMAL HIGH (ref 70–99)
Glucose-Capillary: 163 mg/dL — ABNORMAL HIGH (ref 70–99)
Glucose-Capillary: 204 mg/dL — ABNORMAL HIGH (ref 70–99)
Glucose-Capillary: 129 mg/dL — ABNORMAL HIGH (ref 70–99)

## 2021-10-06 MED ORDER — ALUM & MAG HYDROXIDE-SIMETH 200-200-20 MG/5ML PO SUSP: 30.0000 mL | ORAL | Status: DC | PRN

## 2021-10-06 MED ORDER — NEPRO/CARBSTEADY PO LIQD: 237.0000 mL | Freq: Two times a day (BID) | ORAL | 0 refills | Status: DC

## 2021-10-06 MED ORDER — NEPRO/CARBSTEADY PO LIQD
237.0000 mL | Freq: Two times a day (BID) | ORAL | Status: DC
  Administered 2021-10-07 – 2021-10-13 (×9): 237 mL via ORAL

## 2021-10-06 MED ORDER — RENA-VITE PO TABS: 1.0000 | ORAL_TABLET | Freq: Every day | ORAL | 0 refills | Status: DC

## 2021-10-06 MED ORDER — VITAMIN D (ERGOCALCIFEROL) 1.25 MG (50000 UNIT) PO CAPS
50000.0000 [IU] | ORAL_CAPSULE | ORAL | Status: DC
  Administered 2021-10-09: 50000 [IU] via ORAL
  Filled 2021-10-06 (×2): qty 1

## 2021-10-06 MED ORDER — ENOXAPARIN SODIUM 30 MG/0.3ML IJ SOSY
30.0000 mg | PREFILLED_SYRINGE | INTRAMUSCULAR | Status: DC
  Administered 2021-10-06 – 2021-10-14 (×9): 30 mg via SUBCUTANEOUS
  Filled 2021-10-06 (×9): qty 0.3

## 2021-10-06 MED ORDER — FERRIC CITRATE 1 GM 210 MG(FE) PO TABS
420.0000 mg | ORAL_TABLET | Freq: Three times a day (TID) | ORAL | Status: DC
  Administered 2021-10-06 – 2021-10-08 (×6): 420 mg via ORAL
  Filled 2021-10-06 (×9): qty 2

## 2021-10-06 MED ORDER — LICART 1.3 % EX PT24: 1.0000 | MEDICATED_PATCH | Freq: Two times a day (BID) | CUTANEOUS | Status: DC

## 2021-10-06 MED ORDER — MELATONIN 5 MG PO TABS
5.0000 mg | ORAL_TABLET | Freq: Every evening | ORAL | Status: DC | PRN
  Filled 2021-10-06: qty 1

## 2021-10-06 MED ORDER — MELATONIN 5 MG PO TABS: 5.0000 mg | ORAL_TABLET | Freq: Every evening | ORAL | 0 refills | Status: DC | PRN

## 2021-10-06 MED ORDER — ENOXAPARIN SODIUM 40 MG/0.4ML IJ SOSY: 40.0000 mg | PREFILLED_SYRINGE | INTRAMUSCULAR | Status: DC

## 2021-10-06 MED ORDER — BISACODYL 10 MG RE SUPP
10.0000 mg | Freq: Every day | RECTAL | Status: DC | PRN
  Administered 2021-10-07: 10 mg via RECTAL
  Filled 2021-10-06: qty 1

## 2021-10-06 MED ORDER — FUROSEMIDE 10 MG/ML IJ SOLN
20.0000 mg | Freq: Once | INTRAMUSCULAR | 0 refills | Status: DC
Start: 2021-10-06 — End: 2021-10-16

## 2021-10-06 MED ORDER — METHOCARBAMOL 750 MG PO TABS: 750.0000 mg | ORAL_TABLET | Freq: Four times a day (QID) | ORAL | Status: DC

## 2021-10-06 MED ORDER — HEPARIN SODIUM (PORCINE) 1000 UNIT/ML IJ SOLN
INTRAMUSCULAR | Status: AC
  Filled 2021-10-06: qty 10

## 2021-10-06 MED ORDER — DICLOFENAC EPOLAMINE 1.3 % EX PTCH
1.0000 | MEDICATED_PATCH | Freq: Two times a day (BID) | CUTANEOUS | Status: DC
  Administered 2021-10-06 – 2021-10-14 (×5): 1 via TRANSDERMAL
  Filled 2021-10-06 (×21): qty 1

## 2021-10-06 MED ORDER — ONDANSETRON HCL 4 MG PO TABS: 4.0000 mg | ORAL_TABLET | Freq: Four times a day (QID) | ORAL | Status: DC | PRN

## 2021-10-06 MED ORDER — ONDANSETRON HCL 4 MG/2ML IJ SOLN: 4.0000 mg | Freq: Four times a day (QID) | INTRAMUSCULAR | Status: DC | PRN

## 2021-10-06 MED ORDER — METHOCARBAMOL 750 MG PO TABS
750.0000 mg | ORAL_TABLET | Freq: Four times a day (QID) | ORAL | Status: DC
  Administered 2021-10-06 – 2021-10-16 (×36): 750 mg via ORAL
  Filled 2021-10-06 (×37): qty 1

## 2021-10-06 MED ORDER — DOCUSATE SODIUM 100 MG PO CAPS
100.0000 mg | ORAL_CAPSULE | Freq: Two times a day (BID) | ORAL | Status: DC
  Administered 2021-10-06 – 2021-10-09 (×6): 100 mg via ORAL
  Filled 2021-10-06 (×6): qty 1

## 2021-10-06 MED ORDER — INSULIN ASPART 100 UNIT/ML IJ SOLN
0.0000 [IU] | Freq: Three times a day (TID) | INTRAMUSCULAR | Status: DC
  Administered 2021-10-07: 3 [IU] via SUBCUTANEOUS
  Administered 2021-10-07: 1 [IU] via SUBCUTANEOUS
  Administered 2021-10-07: 2 [IU] via SUBCUTANEOUS
  Administered 2021-10-08 (×2): 1 [IU] via SUBCUTANEOUS
  Administered 2021-10-09: 2 [IU] via SUBCUTANEOUS
  Administered 2021-10-09: 1 [IU] via SUBCUTANEOUS
  Administered 2021-10-10: 2 [IU] via SUBCUTANEOUS
  Administered 2021-10-10: 1 [IU] via SUBCUTANEOUS
  Administered 2021-10-11 (×2): 2 [IU] via SUBCUTANEOUS
  Administered 2021-10-11 – 2021-10-13 (×7): 1 [IU] via SUBCUTANEOUS
  Administered 2021-10-14 (×2): 2 [IU] via SUBCUTANEOUS
  Administered 2021-10-14: 3 [IU] via SUBCUTANEOUS
  Administered 2021-10-16: 1 [IU] via SUBCUTANEOUS

## 2021-10-06 MED ORDER — FERRIC CITRATE 1 GM 210 MG(FE) PO TABS
210.0000 mg | ORAL_TABLET | ORAL | Status: DC
Start: 2021-10-06 — End: 2021-10-09
  Administered 2021-10-06 – 2021-10-08 (×7): 210 mg via ORAL
  Filled 2021-10-06 (×9): qty 1

## 2021-10-06 MED ORDER — MAGNESIUM OXIDE -MG SUPPLEMENT 400 (240 MG) MG PO TABS
200.0000 mg | ORAL_TABLET | Freq: Two times a day (BID) | ORAL | Status: DC
  Administered 2021-10-06 – 2021-10-10 (×8): 200 mg via ORAL
  Filled 2021-10-06 (×8): qty 1

## 2021-10-06 MED ORDER — HYDROCODONE-ACETAMINOPHEN 5-325 MG PO TABS
1.0000 | ORAL_TABLET | ORAL | Status: DC | PRN
  Administered 2021-10-06 – 2021-10-09 (×4): 1 via ORAL
  Filled 2021-10-06 (×4): qty 1

## 2021-10-06 MED ORDER — RENA-VITE PO TABS
1.0000 | ORAL_TABLET | Freq: Every day | ORAL | Status: DC
Start: 2021-10-06 — End: 2021-10-16
  Administered 2021-10-06 – 2021-10-15 (×10): 1 via ORAL
  Filled 2021-10-06 (×10): qty 1

## 2021-10-06 MED ORDER — LOSARTAN POTASSIUM 50 MG PO TABS
50.0000 mg | ORAL_TABLET | Freq: Every day | ORAL | Status: DC
  Administered 2021-10-07 – 2021-10-16 (×8): 50 mg via ORAL
  Filled 2021-10-06 (×10): qty 1

## 2021-10-06 NOTE — Progress Notes (Signed)
[facility]  PROGRESS NOTE   Subjective:   Patient seen and evaluated during dialysis   HEMODIALYSIS FLOWSHEET:  Blood Flow Rate (mL/min): 400 mL/min Arterial Pressure (mmHg): -160 mmHg Venous Pressure (mmHg): 140 mmHg Transmembrane Pressure (mmHg): 60 mmHg Ultrafiltration Rate (mL/min): 830 mL/min Dialysate Flow Rate (mL/min): 500 ml/min Conductivity: Machine : 13.8 Conductivity: Machine : 13.8 Dialysis Fluid Bolus: Normal Saline Bolus Amount (mL): 250 mL  No complaints at this time  Objective:  Vital signs in last 24 hours:  Temp:  [97.6 F (36.4 C)-98.4 F (36.9 C)] 98.1 F (36.7 C) (02/06 1239) Pulse Rate:  [83-102] 102 (02/06 1239) Resp:  [16-80] 17 (02/06 1239) BP: (116-161)/(59-82) 152/64 (02/06 1239) SpO2:  [96 %-100 %] 99 % (02/06 1239)  Weight change:  Filed Weights   10/01/21 1043 10/01/21 1200 10/03/21 2000  Weight: 79.2 kg 80.4 kg 79.2 kg    Intake/Output: No intake/output data recorded.   Intake/Output this shift:  Total I/O In: -  Out: 658 [Other:658]  Physical Exam: General:  No acute distress  Head:  Normocephalic, atraumatic. Moist oral mucosal membranes  Eyes:  Anicteric  Lungs:   Clear to auscultation, normal effort  Heart:  S1S2 no rubs  Abdomen:   Soft, nontender, bowel sounds present  Extremities:  Trace peripheral edema.  Neurologic:  Awake, alert, following commands  Skin:  No lesions  Access: Rt Permcath    Basic Metabolic Panel: Recent Labs  Lab 09/30/21 0645 10/02/21 0417 10/03/21 0352 10/04/21 0447 10/06/21 0445  NA 133* 133* 129* 135 131*  K 5.6* 6.1* 5.6* 4.5 5.1  CL 94* 94* 92* 94* 94*  CO2 25 25 24 27 24   GLUCOSE 162* 183* 168* 149* 172*  BUN 57* 77* 93* 46* 71*  CREATININE 7.30* 9.21* 10.39* 5.82* 9.02*  CALCIUM 8.4* 9.0 8.9 9.1 9.1  PHOS  --  6.0* 6.2* 5.5* 5.9*     CBC: Recent Labs  Lab 10/01/21 0329 10/02/21 0417 10/03/21 0352 10/04/21 0447 10/06/21 0445  WBC 8.7 7.3 6.3 4.8 6.3   NEUTROABS  --  5.6 4.6 3.5 5.0  HGB 6.7* 6.3* 6.7* 7.8* 7.7*  HCT 20.6* 19.5* 20.5* 23.6* 23.5*  MCV 100.0 101.6* 95.3 95.9 95.5  PLT 167 169 183 173 188      Urinalysis: No results for input(s): COLORURINE, LABSPEC, PHURINE, GLUCOSEU, HGBUR, BILIRUBINUR, KETONESUR, PROTEINUR, UROBILINOGEN, NITRITE, LEUKOCYTESUR in the last 72 hours.  Invalid input(s): APPERANCEUR    Imaging: No results found.   Medications:     sodium chloride   Intravenous Once   acetaminophen  650 mg Oral Once   Chlorhexidine Gluconate Cloth  6 each Topical Daily   dextrose  1 ampule Intravenous Once   dextrose  1 ampule Intravenous Once   diclofenac  1 patch Transdermal BID   diphenhydrAMINE  25 mg Oral Once   docusate sodium  100 mg Oral BID   feeding supplement (NEPRO CARB STEADY)  237 mL Oral BID BM   ferric citrate  420 mg Oral TID WC   And   ferric citrate  210 mg Oral With snacks   furosemide  20 mg Intravenous Once   heparin sodium (porcine)       insulin aspart  0-5 Units Subcutaneous QHS   insulin aspart  0-6 Units Subcutaneous TID WC   insulin aspart  10 Units Intravenous Once   losartan  50 mg Oral Daily   magnesium oxide  200 mg Oral BID   methocarbamol  750 mg Oral QID   multivitamin  1 tablet Oral QHS   Vitamin D (Ergocalciferol)  50,000 Units Oral Q7 days    Assessment/ Plan:     Principal Problem:   Femur fracture, left (HCC) Active Problems:   Dyslipidemia   Secondary hyperparathyroidism of renal origin ([place])   Diabetic neuropathy ([place])   End stage renal disease on dialysis ([place])   Type 1 diabetes mellitus with chronic kidney disease on chronic dialysis [facility])   Essential hypertension  56 year old female with history of hypertension, coronary artery disease, diabetes, peripheral vascular disease, hyperlipidemia, end-stage renal disease on dialysis.  Patient admitted with history of left hip fracture s/p surgery.  [facility] [place]/MWF/right PermCath/81kg   #1:  ESRD: Patient is on a Monday Wednesday Friday schedule. Receiving dialysis with no complications, tolerating well. Next treatment scheduled for Wednesday   #2: Permacath: Permcath exchanged during this admission. Functioning well.    #3: Left hip fracture: Patient is s/p hemiarthroplasty on 1/30.  Plan to discharge to CIR after treatment today.    #4: Anemia: Anemia secondary to chronic kidney disease. Hgb 7.7, below target. Will monitor for need of ESA's   #5: Hypertension: Continue antihypertensive medications as ordered. Stable BP during dialysis    LOS: [facility] 2/6/20231:44 PM

## 2021-10-06 NOTE — Progress Notes (Signed)
Report given to [facility], RN @ [facility] inpatient rehab

## 2021-10-06 NOTE — Progress Notes (Signed)
Hemodialysis patient known at [facility] MWF 10am. Prior to admission, patient was beginning training for [facility] at [facility]. Both clinics are aware that patient is going to CIR, plan is to continue to hold chair time at [facility] until patient is released from [facility], just in case that training for [employer] needs to be postponed longer.

## 2021-10-06 NOTE — Discharge Summary (Signed)
Physician Discharge Summary  [name] CWC:[id number] DOB: August 03, 1966 DOA: 09/28/2021  PCP: Pcp, No  Admit date: 09/28/2021 Discharge date: 10/06/2021  Time spent: 37 minutes  Recommendations for Outpatient Follow-up:  Discharging-CIR hip rehab Renal panel, CBC in about 1 week, iron studies in about 3 weeks Pain control first choice oxycodone-continue Flector patch or equivalent/gabapentin In [facility] Outpatient follow-up with nephrology Suggest outpatient evaluation of Cushingoid features-May require 24-hour urine Urine cortisol as OP Suggest continuous glucometer on discharge from CIR  Discharge Diagnoses:  MAIN problem for hospitalization   Left hip fracture status post left hemiarthroplasty 1/30 Nonfunctioning PermCath exchange 10/03/2021 Several blood transfusions for anemia of acute postop expected blood loss Patient is cushingoid-needs outpatient work-up  Please see below for itemized issues addressed in [place]- refer to other progress notes for clarity if needed  Discharge Condition: Improved  Diet recommendation: Renal diabetic low-salt  Filed Weights   10/01/21 1043 10/01/21 1200 10/03/21 2000  Weight: 79.2 kg 80.4 kg 79.2 kg    History of present illness:  56 year old female with end-stage renal disease on hemodialysis, currently via right IJ, 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   Vitals in the emergency department showed temperature of 97.8, respiration rate of 18, heart rate of 99, blood pressure 186/92, SPO2 of 99% on room air. Labs in the emergency department showed serum sodium 137, potassium 4.5, chloride 94, bicarb 27, BUN of 59, serum creatinine of seven 7.75, nonfasting blood glucose 121, GFR of 6. WBC was 4.6, hemoglobin 10, platelets of  168 COVID/influenza A/influenza B PCR were negative.   ED provider ordered a left unilateral with or without pelvis x-ray and portable chest x-ray. Unilateral hip x-ray showed left femur fracture. Follow-up CT redemonstrated left femur fracture at the femoral neck and associated right pelvic fracture Orthopedics and nephrology on consult Status post left hip anterior hemiarthroplasty on 1/30  Hospital Course:    Femur fracture, left ([place]) Status post left hip hemiarthroplasty 1/30 Tylenol first choice, Norco 1 every 4 as needed second choice  Approved for CIR and is stabilized for discharge 10/06/2021 End stage renal disease on dialysis [facility]) Hyperkalemia Nonfunctional PERM-Cath Perm cath exchanged 10/03/2021--appreciate Dr. Delana Meyer input M-W-T-F for 2 hours -training to get home hemodialysis for HD per Nephro Outpatient fluid challenge/EDW challenging as per nephrology-patient has been noncompliant on fluid restriction at home and understands needs to restrict Anemia of acute blood loss postop expected in the setting of ESRD but recent surgery IV iron given 2/1 Tx 1 U PRBC 10/02/21, and again 10/03/2021--had some extravasation of blood in her right forearm-her hemoglobin has stabilized in the high 7 range Patient will need iron studies,?  Epo in about 3 weeks Essential hypertension Amlodipine held, continuing losartan 50 mg daily Outpatient management Type 1 diabetes mellitus with chronic kidney disease on chronic dialysis [facility]) Per endocrinology OV 2022, patient doesn't take glargine 15 units [place] nightly Sliding scale as per prior CBGs ranging 146-211 DM coordinating continuous monitor can be arranged at CIR--With diabetic coordinator  ?  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 Holley Raring [ who has been coordinating her Parathyroid surgery for 10/07/2021] will hopefully be able  to help with care coordination    Procedures: Several as dictated above (i.e. Studies not automatically included, echos, thoracentesis, etc; not x-rays)  Consultations: Nephrology Orthopedics  Discharge Exam: Vitals:   10/06/21 0852 10/06/21 0858  BP: (!) 154/71 (!) 155/68  Pulse:  83  Resp: 20 20  Temp:  98.3 F (36.8 C)  SpO2:      Subj on day of d/c   Seen on HD unit-looks fair No chest pain fever chills Left lower extremity discomfort is better although she has not moved today  General Exam on discharge  Remains cushingoid No icterus no pallor Throat soft supple S1-S2 no murmur Chest clear no rales rhonchi I did not examine left hip   Discharge Instructions   Discharge Instructions     Diet - low sodium heart healthy   Complete by: As directed    Increase activity slowly   Complete by: As directed    No wound care   Complete by: As directed       Allergies as of 10/06/2021       Reactions   Enalapril Hives, Other (See Comments)   Angioedema face.   Ivp Dye [iodinated Contrast Media] Hives   Lisinopril Shortness Of Breath   Shellfish Allergy Swelling, Other (See Comments)   patient tolerates shellfish by mouth without problem        Medication List     TAKE these medications    Auryxia 1 GM 210 MG(Fe) tablet Generic drug: ferric citrate TAKE 2 TABLETS BY MOUTH WITH MEAL AND 1 TABLET WITH SNACK   blood glucose meter kit and supplies Dispense based on patient and insurance preference. Use up to four times daily as directed. (FOR ICD-10 E10.9, E11.9).   feeding supplement (NEPRO CARB STEADY) Liqd Take 237 mLs by mouth 2 (two) times daily between meals.   furosemide 10 MG/ML injection Commonly known as: LASIX Inject 2 mLs (20 mg total) into the vein once for 1 dose.   glucagon 1 MG injection Inject 1 mg into the vein once as needed for up to 1 dose.   glucose blood test strip One Touch Ultra, check fsbs 5 times daily    HYDROcodone-acetaminophen 5-325 MG tablet Commonly known as: NORCO/VICODIN Take 1 tablet by mouth every 4 (four) hours as needed for moderate pain.   Licart 1.3 % IY64 Generic drug: Diclofenac Epolamine Place 1 patch onto the skin in the morning and at bedtime.   losartan 50 MG tablet Commonly known as: COZAAR Take 50 mg by mouth daily.   melatonin 5 MG Tabs Take 1 tablet (5 mg total) by mouth at bedtime as needed.   methocarbamol 750 MG tablet Commonly known as: ROBAXIN Take 1 tablet (750 mg total) by mouth 4 (four) times daily.   multivitamin Tabs tablet Take 1 tablet by mouth at bedtime.   NON FORMULARY as needed (oils used via inhaling and diffuser).   NovoFine 32G X 6 MM Misc Generic drug: Insulin Pen Needle 1 EACH BY DOES NOT APPLY ROUTE 3 (THREE) TIMES DAILY WITH MEALS.   NovoLOG FlexPen 100 UNIT/ML FlexPen Generic drug: insulin aspart INJECT 10-22 UNITS INTO THE SKIN 3 (THREE) TIMES DAILY WITH MEALS.   onetouch ultrasoft lancets Use as instructed               Durable Medical Equipment  (From admission, onward)           Start     Ordered   09/30/21 0000  DME Walker rolling  Once       Question:  Patient needs a walker to treat with the following condition  Answer:  History of total hip replacement, left   09/29/21 2359   09/30/21 0000  DME 3 n 1  Once        09/29/21 2359   09/30/21 0000  DME Bedside commode  Once       Question:  Patient needs a bedside commode to treat with the following condition  Answer:  History of total hip replacement, left   09/29/21 2359           Allergies  Allergen Reactions   Enalapril Hives and Other (See Comments)    Angioedema face.   Ivp Dye [Iodinated Contrast Media] Hives   Lisinopril Shortness Of Breath   Shellfish Allergy Swelling and Other (See Comments)    patient tolerates shellfish by mouth without problem      The results of significant diagnostics from this hospitalization (including  imaging, microbiology, ancillary and laboratory) are listed below for reference.    Significant Diagnostic Studies: CT Hip Left Wo Contrast  Result Date: 09/28/2021 CLINICAL DATA:  Fall, left hip fracture EXAM: CT OF THE LEFT HIP WITHOUT CONTRAST TECHNIQUE: Multidetector CT imaging of the left hip was performed according to the standard protocol. Multiplanar CT image reconstructions were also generated. RADIATION DOSE REDUCTION: This exam was performed according to the departmental dose-optimization program which includes automated exposure control, adjustment of the mA and/or kV according to patient size and/or use of iterative reconstruction technique. COMPARISON:  X-ray 09/28/2021, CT 06/09/2019 FINDINGS: Bones/Joint/Cartilage Marked diffuse osteopenia. Acute transcervical fracture through the left femoral neck with significant varus angulation. No definite intertrochanteric involvement. Hip joint alignment is maintained without dislocation. Acute nondisplaced fracture of the right inferior pubic ramus (series 2, image 91). No additional fractures are identified. Moderate arthropathy of the pubic symphysis with new erosive or resorptive changes of the bilateral pubic bones. Symphysis is mildly widened compared to previous CT. Ligaments Suboptimally assessed by CT. Muscles and Tendons No acute musculotendinous abnormality by CT. Soft tissues Mild soft tissue swelling at the femoral neck fracture site. No organized hematoma. No acute findings within the visualized portion of the left hemipelvis. IMPRESSION: 1. Acute transcervical fracture through the LEFT femoral neck with varus angulation. 2. Acute nondisplaced fracture of the RIGHT inferior pubic ramus. 3. Moderate arthropathy of the pubic symphysis with new resorptive or erosive changes of the bilateral pubic bones. This may be secondary to underlying renal osteodystrophy. Septic arthritis could have this appearance in the appropriate clinical setting. Mild  widening at the pubic symphysis is favored secondary to underlying arthropathy/resorption rather than posttraumatic diastasis. 4. Marked bony demineralization. Electronically Signed   By: Davina Poke D.O.   On: 09/28/2021 18:56   PERIPHERAL VASCULAR CATHETERIZATION  Result Date: 10/03/2021 See surgical note for result.  DG Chest Port 1 View  Result Date: 09/28/2021 CLINICAL DATA:  Fall EXAM: PORTABLE CHEST 1 VIEW COMPARISON:  None. FINDINGS: The cardiomediastinal silhouette is enlarged in contour.RIGHT chest CVC tip terminating over the superior cavoatrial junction. Enlarged appearance of bilateral hilar contours most consistent with enlarged pulmonary arteries and likely underlying pulmonary arterial hypertension. Elevation of the RIGHT hemidiaphragm. No pleural effusion. No pneumothorax. No acute pleuroparenchymal abnormality. Visualized abdomen is unremarkable. Osteopenia. IMPRESSION: 1. Cardiomegaly with enlarged hilar contours likely reflecting enlarged pulmonary arteries and underlying pulmonary arterial hypertension. Electronically Signed   By: Valentino Saxon M.D.   On: 09/28/2021 17:05   DG C-Arm 1-60 Min-No Report  Result Date: 09/29/2021 Fluoroscopy was utilized by the requesting physician.  No radiographic interpretation.  DG HIP UNILAT WITH PELVIS 1V LEFT  Result Date: 09/29/2021 CLINICAL DATA:  Left hip arthroplasty, fracture left femur EXAM: DG HIP (WITH OR WITHOUT PELVIS) 1V*L* COMPARISON:  09/28/2021 FINDINGS: Fluoroscopic images show interval left hip arthroplasty. Fluoroscopic time was 6 seconds. Radiation dose is 0.67 mGy. IMPRESSION: Fluoroscopic assistance was provided for left hip arthroplasty. Electronically Signed   By: Elmer Picker M.D.   On: 09/29/2021 17:02   DG HIP UNILAT WITH PELVIS 2-3 VIEWS LEFT  Result Date: 10/02/2021 CLINICAL DATA:  Left hip pain. EXAM: DG HIP (WITH OR WITHOUT PELVIS) 2-3V LEFT COMPARISON:  Left hip fluoroscopy 09/29/2021 FINDINGS:  Interval total left hip arthroplasty. No perihardware lucency is seen to indicate hardware failure or loosening. Mild-to-moderate right femoroacetabular joint space narrowing. There is diffuse decreased bone mineralization. Expected postoperative changes of the left hip including lateral subcutaneous air and lateral surgical skin staples. IMPRESSION: Interval total left hip arthroplasty without evidence of hardware failure. Electronically Signed   By: Yvonne Kendall M.D.   On: 10/02/2021 13:08   DG Hip Unilat W or Wo Pelvis 2-3 Views Left  Result Date: 09/28/2021 CLINICAL DATA:  Fall EXAM: DG HIP (WITH OR WITHOUT PELVIS) 2-3V LEFT COMPARISON:  None. FINDINGS: Osteopenia. There is a displaced fracture through the neck and intratrochanteric LEFT femur with superior translocation of the distal femur. Femoral head appears to be seated within the acetabulum although evaluation is limited by body habitus. No additional fracture noted. Limited assessment of the sacrum secondary to overlapping bowel contents and profound osteopenia. IMPRESSION: Displaced foreshortened fracture of the neck and inter trochanteric LEFT femur. Electronically Signed   By: Valentino Saxon M.D.   On: 09/28/2021 17:04    Microbiology: Recent Results (from the past 240 hour(s))  Resp Panel by RT-PCR (Flu A&B, Covid) Nasopharyngeal Swab     Status: None   Collection Time: 09/28/21  3:50 PM   Specimen: Nasopharyngeal Swab; Nasopharyngeal(NP) swabs in vial transport medium  Result Value Ref Range Status   SARS Coronavirus 2 by RT PCR NEGATIVE NEGATIVE Final    Comment: (NOTE) SARS-CoV-2 target nucleic acids are NOT DETECTED.  The SARS-CoV-2 RNA is generally detectable in upper respiratory specimens during the acute phase of infection. The lowest concentration of SARS-CoV-2 viral copies this assay can detect is 138 copies/mL. A negative result does not preclude SARS-Cov-2 infection and should not be used as the sole basis for  treatment or other patient management decisions. A negative result may occur with  improper specimen collection/handling, submission of specimen other than nasopharyngeal swab, presence of viral mutation(s) within the areas targeted by this assay, and inadequate number of viral copies(<138 copies/mL). A negative result must be combined with clinical observations, patient history, and epidemiological information. The expected result is Negative.  Fact Sheet for Patients:  EntrepreneurPulse.com.au  Fact Sheet for Healthcare Providers:  IncredibleEmployment.be  This test is no t yet approved or cleared by the [place] FDA and  has been authorized for detection and/or diagnosis of SARS-CoV-2 by FDA under an Emergency Use Authorization (EUA). This EUA will remain  in effect (meaning this test can be used) for the duration of the COVID-19 declaration under Section 564(b)(1) of the Act, 21 U.S.C.section 360bbb-3(b)(1), unless the authorization is terminated  or revoked sooner.       Influenza A by PCR NEGATIVE NEGATIVE Final   Influenza B by PCR NEGATIVE NEGATIVE Final    Comment: (NOTE) The Xpert Xpress SARS-CoV-2/FLU/RSV plus assay is intended as an aid  in the diagnosis of influenza from Nasopharyngeal swab specimens and should not be used as a sole basis for treatment. Nasal washings and aspirates are unacceptable for Xpert Xpress SARS-CoV-2/FLU/RSV testing.  Fact Sheet for Patients: EntrepreneurPulse.com.au  Fact Sheet for Healthcare Providers: IncredibleEmployment.be  This test is not yet approved or cleared by the [place] FDA and has been authorized for detection and/or diagnosis of SARS-CoV-2 by FDA under an Emergency Use Authorization (EUA). This EUA will remain in effect (meaning this test can be used) for the duration of the COVID-19 declaration under Section 564(b)(1) of the Act, 21  U.S.C. section 360bbb-3(b)(1), unless the authorization is terminated or revoked.  Performed at [facility], [address], [place], [place] [address]   Surgical PCR screen     Status: None   Collection Time: 09/29/21  8:13 AM   Specimen: Nasal Mucosa; Nasal Swab  Result Value Ref Range Status   MRSA, PCR NEGATIVE NEGATIVE Final   Staphylococcus aureus NEGATIVE NEGATIVE Final    Comment: (NOTE) The Xpert SA Assay (FDA approved for NASAL specimens in patients 58 years of age and older), is one component of a comprehensive surveillance program. It is not intended to diagnose infection nor to guide or monitor treatment. Performed at [facility], [address] [place],  [address]      Labs: Basic Metabolic Panel: Recent Labs  Lab 09/30/21 0645 10/02/21 0417 10/03/21 0352 10/04/21 0447 10/06/21 0445  NA 133* 133* 129* 135 131*  K 5.6* 6.1* 5.6* 4.5 5.1  CL 94* 94* 92* 94* 94*  CO2 _0 GLUCOSE 162* 183* 168* 149* 172*  BUN 57* 77* 93* 46* 71*  CREATININE 7.30* 9.21* 10.39* 5.82* 9.02*  CALCIUM 8.4* 9.0 8.9 9.1 9.1  PHOS  --  6.0* 6.2* 5.5* 5.9*   Liver Function Tests: Recent Labs  Lab 10/02/21 0417 10/03/21 0352 10/04/21 0447 10/06/21 0445  ALBUMIN 2.9* 3.2* 2.8* 2.9*   No results for input(s): LIPASE, AMYLASE in the last 168 hours. No results for input(s): AMMONIA in the last 168 hours. CBC: Recent Labs  Lab 10/01/21 0329 10/02/21 0417 10/03/21 0352 10/04/21 0447 10/06/21 0445  WBC 8.7 7.3 6.3 4.8 6.3  NEUTROABS  --  5.6 4.6 3.5 5.0  HGB 6.7* 6.3* 6.7* 7.8* 7.7*  HCT 20.6* 19.5* 20.5* 23.6* 23.5*  MCV 100.0 101.6* 95.3 95.9 95.5  PLT 167 169 183 173 188   Cardiac Enzymes: No results for input(s): CKTOTAL, CKMB, CKMBINDEX, TROPONINI in the last 168 hours. BNP: BNP (last 3 results) No results for input(s): BNP in the last 8760 hours.  ProBNP (last 3 results) No results for input(s): PROBNP in the last 8760  hours.  CBG: Recent Labs  Lab 10/04/21 2141 10/05/21 0738 10/05/21 1113 10/05/21 1655 10/06/21 0742  GLUCAP 193* 158* 211* 146* 163*       Signed:  Nita Sells MD   [facility] 10/06/2021, 9:09 AM

## 2021-10-06 NOTE — Progress Notes (Signed)
Patient ID: Paradise Vensel, female   DOB: [birth year], 55 y.o.   MRN: [id number] Met with the patient to review role, rehab process, team conference and plan of care. Discussed anterior hemiarthroplasty precautions/WBAT. Medications, and secondary risk management including HTN, HLD, (Trig 432), DM, ESRD. Reviewed management of constipation and fluid restrictions with HH/CMM/Renal diet. Perma cath right chest for HD; reports educ ongoing for home HD. Continue to follow along to discharge to address educational needs and facilitate preparation for discharge home. Margarito Liner

## 2021-10-06 NOTE — Progress Notes (Signed)
Inpatient Rehabilitation Admission Medication Review by a Pharmacist  A complete drug regimen review was completed for this patient to identify any potential clinically significant medication issues.  High Risk Drug Classes Is patient taking? Indication by Medication  Antipsychotic No   Anticoagulant No   Antibiotic No   Opioid Yes Hydrocodone s/p THR  Antiplatelet No   Hypoglycemics/insulin Yes SSI for DM  Vasoactive Medication Yes Losartan for HTN  Chemotherapy No   Other No      Type of Medication Issue Identified Description of Issue Recommendation(s)  Drug Interaction(s) (clinically significant)     Duplicate Therapy     Allergy     No Medication Administration End Date     Incorrect Dose     Additional Drug Therapy Needed  DVT prophx. Lovenox 30mg /d (notes say PermCath placed 2/3)  Significant med changes from prior encounter (inform family/care partners about these prior to discharge).    Other       Clinically significant medication issues were identified that warrant physician communication and completion of prescribed/recommended actions by midnight of the next day:  Yes  Name of provider notified for urgent issues identified: Marlowe Shores, PA  Provider Method of Notification: chat  Pharmacist comments: high-risk for VTE  Time spent performing this drug regimen review (minutes):  65min   Wayland Salinas 10/06/2021 3:13 PM

## 2021-10-06 NOTE — H&P (Signed)
Physical Medicine and Rehabilitation Admission H&P        Chief Complaint  Patient presents with   Hip Pain  : HPI: [name] is a 56 year old right-handed female with history of end-stage renal disease with hemodialysis via right IJ, diabetes mellitus with neuropathy, acute on chronic anemia, hyperlipidemia, obesity with BMI 27.76, secondary hyperparathyroidism of renal origin and currently awaiting outpatient scheduling for evaluation of parathyroidectomy, hypertension.  Per chart review patient lives with spouse.  1 level home 5 steps to entry.  Modified independent with Rollator for household and community ambulation and bilateral axillary crutches for stair negotiation.  Presented to [facility] 09/28/2021 with complaints of severe left hip pain.  Patient states her leg gave out and she heard a pop on the left hip.  She also had complaints of right side hip pain from a fall.  X-rays and imaging revealed displaced foreshortened fracture of the neck and intertrochanteric left femur as well as acute nondisplaced fracture of the right inferior pubic ramus.  Patient underwent left hip anterior hip hemiarthroplasty 09/29/2021 per Dr. Kurtis Bushman.  Conservative care of right inferior pubic ramus fracture.  Patient is currently weightbearing as tolerated left lower extremity as well as weightbearing as tolerated right lower extremity.  Hospital course hemodialysis ongoing vascular surgery Dr Rica Koyanagi consulted to evaluate for exchange PermCath from right IJ to better dialysis 10/03/2021.  Hospital course acute on chronic anemia latest hemoglobin 6.7 of which she was transfused and follow-up hemoglobin 7.7 and patient also remains on iron supplement.  Therapy evaluations completed due to patient decreased functional mobility was admitted for a comprehensive rehab program   Review of Systems  Constitutional:  Negative for chills and fever.  HENT:  Negative for hearing loss.   Eyes:  Negative for  blurred vision and double vision.  Respiratory:  Negative for cough and shortness of breath.   Cardiovascular:  Negative for chest pain, palpitations and leg swelling.  Gastrointestinal:  Positive for constipation. Negative for heartburn, nausea and vomiting.       GERD  Genitourinary:  Negative for dysuria, flank pain and hematuria.  Musculoskeletal:  Positive for joint pain and myalgias.  Skin:  Negative for rash.  Neurological:  Positive for weakness.  Psychiatric/Behavioral:         Anxiety  All other systems reviewed and are negative.     Past Medical History:  Diagnosis Date   Anemia      vitamin d3 deficiency   Anxiety     Chronic kidney disease      End Stage Renal Disease   Diabetes mellitus without complication (HCC)     GERD (gastroesophageal reflux disease)      nothing over last few years   High serum parathyroid hormone (PTH)      checked through Dialysis   History of kidney stones 2000   Hypertension     Neuromuscular disorder (HCC)      neuropathy in feet   PONV (postoperative nausea and vomiting)      severe nausea requiring many doses of post op antiemetics   Stroke ([place]) 10/2017    thinks she had a series of mini strokes.right leg up to right side of face were numb. no loss of consciousness         Past Surgical History:  Procedure Laterality Date   ABDOMINAL HYSTERECTOMY   2007   AV FISTULA INSERTION W/ RF MAGNETIC GUIDANCE N/A 12/08/2017    Procedure: AV FISTULA INSERTION  W/RF MAGNETIC GUIDANCE;  Surgeon: Katha Cabal, MD;  Location: [place] [facility];  Service: Cardiovascular;  Laterality: N/A;   DIALYSIS/PERMA CATHETER INSERTION Right 10/03/2021    Procedure: DIALYSIS/PERMA CATHETER INSERTION;  Surgeon: Katha Cabal, MD;  Location: [place] [facility];  Service: Cardiovascular;  Laterality: Right;   HIP ARTHROPLASTY Left 09/29/2021    Procedure: ARTHROPLASTY BIPOLAR HIP (HEMIARTHROPLASTY);  Surgeon: Lovell Sheehan, MD;  Location: [facility]  [facility];  Service: Orthopedics;  Laterality: Left;   ROTATOR CUFF REPAIR Right 2004   SHOULDER CLOSED REDUCTION Right 2004   UPPER EXTREMITY VENOGRAPHY Left 02/15/2018    Procedure: UPPER EXTREMITY VENOGRAPHY;  Surgeon: Katha Cabal, MD;  Location: [place] [facility];  Service: Cardiovascular;  Laterality: Left;         Family History  Problem Relation Age of Onset   Emphysema Mother     COPD Mother     Cancer Father     Cancer Paternal Aunt     Diabetes Sister     Hypertension Sister     Eczema Sister     Diabetes Brother     Hypertension Brother     Cardiomyopathy Brother     Alcohol abuse Brother      Social History:  reports that she has never smoked. She has never used smokeless tobacco. She reports that she does not drink alcohol and does not use drugs. Allergies:       Allergies  Allergen Reactions   Enalapril Hives and Other (See Comments)      Angioedema face.   Ivp Dye [Iodinated Contrast Media] Hives   Lisinopril Shortness Of Breath   Shellfish Allergy Swelling and Other (See Comments)      patient tolerates shellfish by mouth without problem          Medications Prior to Admission  Medication Sig Dispense Refill   AURYXIA 1 GM 210 MG(Fe) tablet TAKE 2 TABLETS BY MOUTH WITH MEAL AND 1 TABLET WITH SNACK       blood glucose meter kit and supplies Dispense based on patient and insurance preference. Use up to four times daily as directed. (FOR ICD-10 E10.9, E11.9). 1 each 0   glucagon (GLUCAGON EMERGENCY) 1 MG injection Inject 1 mg into the vein once as needed for up to 1 dose. 1 each 2   glucose blood test strip One Touch Ultra, check fsbs 5 times daily 200 each 2   Lancets (ONETOUCH ULTRASOFT) lancets Use as instructed 200 each 2   losartan (COZAAR) 50 MG tablet Take 50 mg by mouth daily.        NOVOFINE 32G X 6 MM MISC 1 EACH BY DOES NOT APPLY ROUTE 3 (THREE) TIMES DAILY WITH MEALS. 100 each 0   NOVOLOG FLEXPEN 100 UNIT/ML FlexPen INJECT 10-22 UNITS INTO THE  SKIN 3 (THREE) TIMES DAILY WITH MEALS. 15 mL 0   NON FORMULARY as needed (oils used via inhaling and diffuser).           Drug Regimen Review Drug regimen was reviewed and remains appropriate with no significant issues identified   Home: Home Living Family/patient expects to be discharged to:: Inpatient rehab Living Arrangements: Spouse/significant other Available Help at Discharge: Family, Available PRN/intermittently Type of Home: House Home Access: Stairs to enter [employer] of Steps: 5 Entrance Stairs-Rails: Left, Right Home Layout: One level Bathroom Shower/Tub: Chiropodist: Standard Bathroom Accessibility: Yes Home Equipment: Crutches, Rollator (4 wheels)   Functional History:  Prior Function Prior Level of Function : Independent/Modified Independent Mobility Comments: Mod indep with rollator for household/community mobilization; bilat axillary crutches for stair negotiation.  Drives self to/from dialysis. Denies fall history outside of this event   Functional Status:  Mobility: Bed Mobility Overal bed mobility: Needs Assistance Bed Mobility: Supine to Sit Supine to sit: Min guard Sit to supine: Mod assist, +2 for physical assistance General bed mobility comments: increased time but with leg lifter/gait belt she is able to manage on her own Transfers Overall transfer level: Needs assistance Equipment used: Rolling walker (2 wheels) Transfers: Sit to/from Stand Sit to Stand: Mod assist, +2 physical assistance Bed to/from chair/wheelchair/BSC transfer type:: Step pivot Step pivot transfers: Mod assist, Max assist, +2 physical assistance  Lateral/Scoot Transfers: Mod assist Transfer via Lift Equipment:  (hoyer) General transfer comment: able to transfert to/from commode with heavy assist and cues with physical assist to move LLE Ambulation/Gait Ambulation/Gait assistance: Mod assist, Max assist Gait Distance (Feet): 3 Feet Assistive  device: Rolling walker (2 wheels) Gait Pattern/deviations: Step-to pattern General Gait Details: unsafe to progress further than transfers Gait velocity: decreased   ADL: ADL Overall ADL's : Needs assistance/impaired General ADL Comments: MIN A x2 for ADL t/f   Cognition: Cognition Overall Cognitive Status: Within Functional Limits for tasks assessed Orientation Level: Oriented X4 Cognition Arousal/Alertness: Awake/alert Behavior During Therapy: Anxious, WFL for tasks assessed/performed Overall Cognitive Status: Within Functional Limits for tasks assessed General Comments: Pt is less anxious and more motivated today.   Physical Exam: Blood pressure (!) 145/66, pulse (!) 101, temperature 98.3 F (36.8 C), temperature source Oral, resp. rate 20, height $RemoveBe'5\' 7"'[id number]$  (1.702 m), weight 79.2 kg, SpO2 98 %. Physical Exam Constitutional:      General: She is not in acute distress.    Appearance: She is obese.  HENT:     Head: Normocephalic.     Right Ear: External ear normal.     Left Ear: External ear normal.     Nose: Nose normal.     Mouth/Throat:     Mouth: Mucous membranes are moist.     Pharynx: Oropharynx is clear.  Eyes:     General: No scleral icterus.    Extraocular Movements: Extraocular movements intact.     Pupils: Pupils are equal, round, and reactive to light.  Cardiovascular:     Rate and Rhythm: Regular rhythm. Tachycardia present.     Heart sounds: No murmur heard.   No gallop.  Pulmonary:     Effort: Pulmonary effort is normal. No respiratory distress.     Breath sounds: No wheezing.  Abdominal:     General: Bowel sounds are normal. There is no distension.     Palpations: Abdomen is soft. There is no mass.  Musculoskeletal:        General: Swelling and tenderness present.     Cervical back: Normal range of motion and neck supple.     Right lower leg: Edema present.     Left lower leg: Edema present.     Comments: Left hip tender to palpation and PROM with  associated swelling. Left knee with mild effusion, patella laxity. Right hip tender with leg raise  Skin:    Comments: Incision site dressing clean dry and intact.   Neurological:     Comments: Patient is alert.  No acute distress.  Oriented x3 and follows commands. Alert and oriented x 3. Normal insight and awareness. Intact Memory. Normal language and speech. Cranial nerve exam  unremarkable. UE motor 5/5. LE motor limited proximally by pain. ADF/PF 4-5/5. No sensory findings.   Psychiatric:     Comments: Pleasant, slightly anxious      Lab Results Last 48 Hours        Results for orders placed or performed during the hospital encounter of 09/28/21 (from the past 48 hour(s))  Glucose, capillary     Status: Abnormal    Collection Time: 10/04/21 12:55 PM  Result Value Ref Range    Glucose-Capillary 250 (H) 70 - 99 mg/dL      Comment: Glucose reference range applies only to samples taken after fasting for at least 8 hours.    Comment 1 Notify RN      Comment 2 Document in Chart    Glucose, capillary     Status: Abnormal    Collection Time: 10/04/21  4:03 PM  Result Value Ref Range    Glucose-Capillary 160 (H) 70 - 99 mg/dL      Comment: Glucose reference range applies only to samples taken after fasting for at least 8 hours.    Comment 1 Notify RN      Comment 2 Document in Chart    Glucose, capillary     Status: Abnormal    Collection Time: 10/04/21  9:41 PM  Result Value Ref Range    Glucose-Capillary 193 (H) 70 - 99 mg/dL      Comment: Glucose reference range applies only to samples taken after fasting for at least 8 hours.  Glucose, capillary     Status: Abnormal    Collection Time: 10/05/21  7:38 AM  Result Value Ref Range    Glucose-Capillary 158 (H) 70 - 99 mg/dL      Comment: Glucose reference range applies only to samples taken after fasting for at least 8 hours.  Glucose, capillary     Status: Abnormal    Collection Time: 10/05/21 11:13 AM  Result Value Ref Range     Glucose-Capillary 211 (H) 70 - 99 mg/dL      Comment: Glucose reference range applies only to samples taken after fasting for at least 8 hours.  Glucose, capillary     Status: Abnormal    Collection Time: 10/05/21  4:55 PM  Result Value Ref Range    Glucose-Capillary 146 (H) 70 - 99 mg/dL      Comment: Glucose reference range applies only to samples taken after fasting for at least 8 hours.  Renal function panel     Status: Abnormal    Collection Time: 10/06/21  4:45 AM  Result Value Ref Range    Sodium 131 (L) 135 - 145 mmol/L    Potassium 5.1 3.5 - 5.1 mmol/L    Chloride 94 (L) 98 - 111 mmol/L    CO2 24 22 - 32 mmol/L    Glucose, Bld 172 (H) 70 - 99 mg/dL      Comment: Glucose reference range applies only to samples taken after fasting for at least 8 hours.    BUN 71 (H) 6 - 20 mg/dL    Creatinine, Ser 9.02 (H) 0.44 - 1.00 mg/dL      Comment: RESULTS VERIFIED BY REPEAT TESTING GA    Calcium 9.1 8.9 - 10.3 mg/dL    Phosphorus 5.9 (H) 2.5 - 4.6 mg/dL    Albumin 2.9 (L) 3.5 - 5.0 g/dL    GFR, Estimated 5 (L) >60 mL/min      Comment: (NOTE) Calculated using the CKD-EPI Creatinine Equation (2021)  Anion gap 13 5 - 15      Comment: Performed at [facility], [address] [place], [place] [address]  CBC with Differential/Platelet     Status: Abnormal    Collection Time: 10/06/21  4:45 AM  Result Value Ref Range    WBC 6.3 4.0 - 10.5 K/uL    RBC 2.46 (L) 3.87 - 5.11 MIL/uL    Hemoglobin 7.7 (L) 12.0 - 15.0 g/dL    HCT 23.5 (L) 36.0 - 46.0 %    MCV 95.5 80.0 - 100.0 fL    MCH 31.3 26.0 - 34.0 pg    MCHC 32.8 30.0 - 36.0 g/dL    RDW 15.3 11.5 - 15.5 %    Platelets 188 150 - 400 K/uL    nRBC 0.0 0.0 - 0.2 %    Neutrophils Relative % 78 %    Neutro Abs 5.0 1.7 - 7.7 K/uL    Lymphocytes Relative 8 %    Lymphs Abs 0.5 (L) 0.7 - 4.0 K/uL    Monocytes Relative 9 %    Monocytes Absolute 0.6 0.1 - 1.0 K/uL    Eosinophils Relative 3 %    Eosinophils Absolute 0.2 0.0 -  0.5 K/uL    Basophils Relative 1 %    Basophils Absolute 0.0 0.0 - 0.1 K/uL    Immature Granulocytes 1 %    Abs Immature Granulocytes 0.05 0.00 - 0.07 K/uL      Comment: Performed at [facility], [address] [place], [place] [address]  Glucose, capillary     Status: Abnormal    Collection Time: 10/06/21  7:42 AM  Result Value Ref Range    Glucose-Capillary 163 (H) 70 - 99 mg/dL      Comment: Glucose reference range applies only to samples taken after fasting for at least 8 hours.      Imaging Results (Last 48 hours)  No results found.           Medical Problem List and Plan: 1. Functional deficits secondary to nondisplaced fracture right inferior pubic ramus as well as a displaced foreshortened fracture through the left femoral neck/intertrochanteric area.  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   -Weightbearing as tolerated with anterior total hip precautions.     -Conservative care right inferior pubic ramus fracture, weightbearing as tolerated             -patient may  shower             -ELOS/Goals: 10-14 days, supervision to min assist goals 2.  Antithrombotics: -DVT/anticoagulation:  Mechanical: Antiembolism stockings, thigh (TED hose) Bilateral lower extremities check vascular study.   -should be able to begin lovenox today as permacath placed            -antiplatelet therapy: N/A 3. Pain Management: Flector patch,Robaxin 750 mg 4 times daily, hydrocodone as needed             -pt having significant spasms in left thigh.             -add kpad to help with spasms 4. Mood: Provide emotional support.  Melatonin as needed             -antipsychotic agents: N/A 5. Neuropsych: This patient is capable of making decisions on her own behalf. 6. Skin/Wound Care: Routine skin checks 7. Fluids/Electrolytes/Nutrition: Routine in and outs with follow-up chemistries 8.  End-stage renal disease/hemodialysis.  Permacath exchanged 10/03/2021.    -  Follow-up  hemodialysis per Dr. [name] -HD later in day to allow participation in therapies during the day 9.  Acute on chronic anemia.  Has had transfusions already x 2. Hgb has stabilized in 7 range..  Continue iron supplement.              -epo per nephrology 10.  Diabetes mellitus with peripheral neuropathy.  Hemoglobin A1c 6.2.  SSI as prior to admission. 11.  Hypertension.  Cozaar 50 mg daily, 12.  Hyperparathyroidism of renal origin.  Plan outpatient parathyroidectomy per Dr.Lateef 13.  Obesity.  BMI 27.76.  Dietary follow-up 14. Cushingoid?--outpt work up     [employer], PA-C 10/06/2021   I have personally performed a face to face diagnostic evaluation of this patient and formulated the key components of the plan.  Additionally, I have personally reviewed laboratory data, imaging studies, as well as relevant notes and concur with the physician assistant's documentation above.  The patient's status has not changed from the original H&P.  Any changes in documentation from the acute care chart have been noted above.  Meredith Staggers, MD, Mellody Drown

## 2021-10-06 NOTE — Progress Notes (Addendum)
Inpatient Rehabilitation Admissions Coordinator   CIR/Inpt rehab bed at [facility] is available to admit her today. I spoke with her spouse by phone. He is in agreement to admit. She had an appointment for consultation, not surgery 2/7 to discuss parathyroid surgery. I explained to spouse that we can not transport her to that appointment, that it would need to be rescheduled. She is to be dialyzed this morning, then I will arrange [facility] transport to transport her this afternoon. Time To be determined.Acute team and TOC will be made aware.  Danne Baxter, RN, MSN Rehab Admissions Coordinator [phone] 10/06/2021 8:31 AM  I have notified Dr Jonnie Finner, nephrology and [facility] in Hemodialysis of patient's planned admit to 5C08 today.  Danne Baxter, RN, MSN Rehab Admissions Coordinator [phone] 10/06/2021 10:39 AM  Marcello Moores at [facility] notified of discharge to [place] today. [place] be [address]30 to 1400 to transport.  Danne Baxter, RN, MSN Rehab Admissions Coordinator [phone] 10/06/2021 10:43 AM

## 2021-10-06 NOTE — Progress Notes (Signed)
EMS here to transport patient to [place] in patient rehab.

## 2021-10-06 NOTE — Progress Notes (Signed)
Meredith Staggers, MD  Physician Physical Medicine and Rehabilitation PMR Pre-admission    Signed Date of Service:  10/03/2021  3:00 PM  Related encounter: ED to Hosp-Admission (Discharged) from 09/28/2021 in [place] (1A)   Signed      Show:Clear all [x] Written[x] Templated[x] Copied  Added by: [x] Cristina Gong, RN[x] Meredith Staggers, MD  [] Hover for details                                                                                                                                                                                                                                                                                                                                                                                                                                                                PMR Admission Coordinator Pre-Admission Assessment   Patient: [name] is an 56 y.o., female MRN: [id number] DOB: June 25, 1966 Height: 5\' 7"  (170.2 cm) Weight: 79.2 kg Insurance Information HMO:     PPO:      PCP:      IPA:      80/20:      OTHER:  PRIMARY: Medicare a and b      Policy#: [id number]      Subscriber: pt Benefits:  Phone #: passport one source online  Name: 10/01/2021 Eff. Date: 07/31/2018     Deduct: $1600      Out of Pocket Max: none      Life Max: none CIR: 100%      SNF: 20 full days Outpatient: 80%     Co-Pay: 20% Home Health: 100%      Co-Pay: none DME: 80%     Co-Pay: 20% Providers: pt choice  SECONDARY: [place] [employer]      Policy#: [id number] verified to be secondary by pre service center as well as call to [employer] at [phone] per rep on 2/1 reference number [id number]   Financial Counselor:       Phone#:    The Data Collection Information Summary for patients in  Inpatient Rehabilitation Facilities with attached Privacy Act [place] Records was provided and verbally reviewed with: Patient and Family   Emergency Contact Information Contact Information       Name Relation Home Work Mobile    [place] Spouse [phone]             Current Medical History  Patient Admitting Diagnosis: Hip Fracture   History of Present Illness:  56 year old right-handed female with history of end-stage renal disease with hemodialysis via right IJ, diabetes mellitus with neuropathy, acute on chronic anemia, hyperlipidemia, obesity with BMI 27.76, secondary hyperparathyroidism of renal origin and currently awaiting outpatient scheduling for evaluation of parathyroidectomy, hypertension.   Modified independent with Rollator for household and community ambulation and bilateral axillary crutches for stair negotiation.  Presented to [facility] 09/28/2021 with complaints of severe left hip pain.  Patient states her leg gave out and she heard a pop on the left hip.  She also had complaints of right side hip pain from a fall.  X-rays and imaging revealed displaced foreshortened fracture of the neck and intertrochanteric left femur as well as acute nondisplaced fracture of the right inferior pubic ramus.  Patient underwent left hip anterior hip hemiarthroplasty 09/29/2021 per Dr. Kurtis Bushman.  Conservative care of right inferior pubic ramus fracture.  Patient is currently weightbearing as tolerated left lower extremity as well as weightbearing as tolerated right lower extremity.  Hospital course hemodialysis ongoing vascular surgery Dr Rica Koyanagi consulted to evaluate for exchange PermCath from right IJ to better dialysis 10/03/2021.  Hospital course acute on chronic anemia latest hemoglobin 6.7 of which she was transfused and follow-up hemoglobin 7.7 and patient also remains on iron supplement.     Patient's medical record from [facility] has been reviewed by the rehabilitation  admission coordinator and physician.   Past Medical History      Past Medical History:  Diagnosis Date   Anemia      vitamin d3 deficiency   Anxiety     Chronic kidney disease      End Stage Renal Disease   Diabetes mellitus without complication (HCC)     GERD (gastroesophageal reflux disease)      nothing over last few years   High serum parathyroid hormone (PTH)      checked through Dialysis   History of kidney stones 2000   Hypertension     Neuromuscular disorder ([place])      neuropathy in feet   PONV (postoperative nausea and vomiting)      severe nausea requiring many doses of post op antiemetics   Stroke ([place]) 10/2017    thinks she had a series of mini strokes.right leg up to right side of face were numb. no loss of consciousness  Has the patient had major surgery during 100 days prior to admission? Yes   Family History   family history includes Alcohol abuse in her brother; COPD in her mother; Cancer in her father and paternal aunt; Cardiomyopathy in her brother; Diabetes in her brother and sister; Eczema in her sister; Emphysema in her mother; Hypertension in her brother and sister.   Current Medications   Current Facility-Administered Medications:    0.9 %  sodium chloride infusion (Manually program via Guardrails IV Fluids), , Intravenous, Once, Nita Sells, MD   acetaminophen (TYLENOL) tablet 650 mg, 650 mg, Oral, Once, Foust, Katy L, NP   alum & mag hydroxide-simeth (MAALOX/MYLANTA) 200-200-20 MG/5ML suspension 30 mL, 30 mL, Oral, Q4H PRN, Lovell Sheehan, MD   bisacodyl (DULCOLAX) suppository 10 mg, 10 mg, Rectal, Daily PRN, Lovell Sheehan, MD   Chlorhexidine Gluconate Cloth 2 % PADS 6 each, 6 each, Topical, Daily, Sreenath, Sudheer B, MD, 6 each at 10/05/21 1737   dextrose 50 % solution 50 mL, 1 ampule, Intravenous, Once, Lovell Sheehan, MD   dextrose 50 % solution 50 mL, 1 ampule, Intravenous, Once, Lovell Sheehan, MD   diclofenac (FLECTOR) 1.3 % 1  patch, 1 patch, Transdermal, BID, Nita Sells, MD, 1 patch at 10/05/21 1007   diphenhydrAMINE (BENADRYL) capsule 25 mg, 25 mg, Oral, Once, Samtani, Jai-Gurmukh, MD   docusate sodium (COLACE) capsule 100 mg, 100 mg, Oral, BID, Lovell Sheehan, MD, 100 mg at 10/05/21 2152   feeding supplement (NEPRO CARB STEADY) liquid 237 mL, 237 mL, Oral, BID BM, Sreenath, Sudheer B, MD, 237 mL at 10/04/21 0932   ferric citrate (AURYXIA) tablet 420 mg, 420 mg, Oral, TID WC, 420 mg at 10/05/21 1733 **AND** ferric citrate (AURYXIA) tablet 210 mg, 210 mg, Oral, With snacks, Lovell Sheehan, MD, 210 mg at 10/05/21 2035   furosemide (LASIX) injection 20 mg, 20 mg, Intravenous, Once, Samtani, Jai-Gurmukh, MD   hydrALAZINE (APRESOLINE) injection 10 mg, 10 mg, Intravenous, Q4H PRN, Lovell Sheehan, MD   HYDROcodone-acetaminophen (NORCO/VICODIN) 5-325 MG per tablet 1 tablet, 1 tablet, Oral, Q4H PRN, Lovell Sheehan, MD, 1 tablet at 10/05/21 2152   insulin aspart (novoLOG) injection 0-5 Units, 0-5 Units, Subcutaneous, QHS, Lovell Sheehan, MD, 3 Units at 10/03/21 2321   insulin aspart (novoLOG) injection 0-6 Units, 0-6 Units, Subcutaneous, TID WC, Lovell Sheehan, MD, 2 Units at 10/05/21 1240   insulin aspart (novoLOG) injection 10 Units, 10 Units, Intravenous, Once, Lovell Sheehan, MD   labetalol (NORMODYNE) injection 5 mg, 5 mg, Intravenous, Q2H PRN, Lovell Sheehan, MD, 5 mg at 09/28/21 2019   losartan (COZAAR) tablet 50 mg, 50 mg, Oral, Daily, Lovell Sheehan, MD, 50 mg at 10/05/21 1002   magnesium hydroxide (MILK OF MAGNESIA) suspension 30 mL, 30 mL, Oral, Daily PRN, Lovell Sheehan, MD   magnesium oxide (MAG-OX) tablet 200 mg, 200 mg, Oral, BID, Lovell Sheehan, MD, 200 mg at 10/05/21 2151   melatonin tablet 5 mg, 5 mg, Oral, QHS PRN, Lovell Sheehan, MD   menthol-cetylpyridinium (CEPACOL) lozenge 3 mg, 1 lozenge, Oral, PRN **OR** phenol (CHLORASEPTIC) mouth spray 1 spray, 1 spray, Mouth/Throat, PRN, Lovell Sheehan, MD   methocarbamol (ROBAXIN) tablet 750 mg, 750 mg, Oral, QID, Sreenath, Sudheer B, MD, 750 mg at 10/05/21 2150   multivitamin (RENA-VIT) tablet 1 tablet, 1 tablet, Oral, QHS, Sreenath, Sudheer B, MD, 1 tablet at 10/05/21 2210   ondansetron (ZOFRAN) tablet 4  mg, 4 mg, Oral, Q6H PRN, 4 mg at 10/03/21 1513 **OR** ondansetron (ZOFRAN) injection 4 mg, 4 mg, Intravenous, Q6H PRN, Lovell Sheehan, MD   Vitamin D (Ergocalciferol) (DRISDOL) capsule 50,000 Units, 50,000 Units, Oral, Q7 days, Nita Sells, MD, 50,000 Units at 10/02/21 1607   Patients Current Diet:  Diet Order                  Diet - low sodium heart healthy             Diet renal/carb modified with fluid restriction Diet-HS Snack? Nothing; Fluid restriction: 1200 mL Fluid; Room service appropriate? Yes; Fluid consistency: Thin  Diet effective now                       Precautions / Restrictions Precautions Precautions: Fall Precaution Comments: L AVF, R IJ perm-cath ( will need new dialysis cath) Restrictions Weight Bearing Restrictions: Yes RLE Weight Bearing: Weight bearing as tolerated LLE Weight Bearing: Weight bearing as tolerated    Has the patient had 2 or more falls or a fall with injury in the past year? Yes   Prior Activity Level Community (5-7x/wk): decline in funcion; using RW and crutches as needed   Prior Functional Level Self Care: Did the patient need help bathing, dressing, using the toilet or eating? Independent   Indoor Mobility: Did the patient need assistance with walking from room to room (with or without device)? Independent   Stairs: Did the patient need assistance with internal or external stairs (with or without device)? Independent   Functional Cognition: Did the patient need help planning regular tasks such as shopping or remembering to take medications? Independent   Patient Information Are you of Hispanic, Latino/a,or Spanish origin?: A. No, not of Hispanic, Latino/a,  or Spanish origin What is your race?: A. White Do you need or want an interpreter to communicate with a doctor or health care staff?: 0. No   Patient's Response To:  Health Literacy and Transportation Is the patient able to respond to health literacy and transportation needs?: Yes Health Literacy - How often do you need to have someone help you when you read instructions, pamphlets, or other written material from your doctor or pharmacy?: Never In the past 12 months, has lack of transportation kept you from medical appointments or from getting medications?: No In the past 12 months, has lack of transportation kept you from meetings, work, or from getting things needed for daily living?: No   Home Assistive Devices / [place] Devices/Equipment: Environmental consultant (specify type), Crutches, Shower chair with back Home Equipment: Crutches, Rollator (4 wheels)   Prior Device Use: Indicate devices/aids used by the patient prior to current illness, exacerbation or injury? Walker   Current Functional Level Cognition   Overall Cognitive Status: Within Functional Limits for tasks assessed Orientation Level: Oriented X4 General Comments: Pt is less anxious and more motivated today.    Extremity Assessment (includes Sensation/Coordination)   Upper Extremity Assessment: Overall WFL for tasks assessed  Lower Extremity Assessment: Generalized weakness     ADLs   Overall ADL's : Needs assistance/impaired General ADL Comments: MIN A x2 for ADL t/f     Mobility   Overal bed mobility: Needs Assistance Bed Mobility: Supine to Sit Supine to sit: Min guard Sit to supine: Mod assist, +2 for physical assistance General bed mobility comments: increased time but with leg lifter/gait belt she is able to manage on her own  Transfers   Overall transfer level: Needs assistance Equipment used: Rolling walker (2 wheels) Transfers: Sit to/from Stand Sit to Stand: Mod assist, +2 physical  assistance Bed to/from chair/wheelchair/BSC transfer type:: Step pivot Step pivot transfers: Mod assist, Max assist, +2 physical assistance  Lateral/Scoot Transfers: Mod assist Transfer via Lift Equipment:  (hoyer) General transfer comment: able to transfert to/from commode with heavy assist and cues with physical assist to move LLE     Ambulation / Gait / Stairs / Wheelchair Mobility   Ambulation/Gait Ambulation/Gait assistance: Mod assist, Max assist Gait Distance (Feet): 3 Feet Assistive device: Rolling walker (2 wheels) Gait Pattern/deviations: Step-to pattern General Gait Details: unsafe to progress further than transfers Gait velocity: decreased     Posture / Balance Balance Overall balance assessment: Needs assistance Sitting-balance support: No upper extremity supported, Feet supported Sitting balance-Leahy Scale: Good Standing balance support: Bilateral upper extremity supported Standing balance-Leahy Scale: Poor Standing balance comment: CGA - MIN A once upright     Special needs/care consideration Was training for home hemodialysis    Previous Home Environment  Living Arrangements: Spouse/significant other Available Help at Discharge: Family, Available PRN/intermittently Type of Home: House Home Layout: One level Home Access: Stairs to enter Entrance Stairs-Rails: Left, Right Entrance Stairs-Number of Steps: 5 Bathroom Shower/Tub: Government social research officer Accessibility: Yes How Accessible: Accessible via walker Home Care Services:  (training for home hemodialysis)   Discharge Living Setting Plans for Discharge Living Setting: Patient's home, Lives with (comment) (spouse) Type of Home at Discharge: House Discharge Home Layout: One level Discharge Home Access: Stairs to enter Entrance Stairs-Rails: Right, Left Entrance Stairs-Number of Steps: 5 Discharge Bathroom Shower/Tub: Tub/shower unit Discharge Bathroom Toilet:  Standard Discharge Bathroom Accessibility: Yes How Accessible: Accessible via walker Does the patient have any problems obtaining your medications?: No   Social/Family/Support Systems Patient Roles: Spouse Contact Information: spouse, Aaron Edelman Anticipated Caregiver: Spouse Anticipated Caregiver's Contact Information: see contacts Ability/Limitations of Caregiver: none Caregiver Availability: 24/7 Discharge Plan Discussed with Primary Caregiver: Yes Is Caregiver In Agreement with Plan?: Yes Does Caregiver/Family have Issues with Lodging/Transportation while Pt is in Rehab?: No   Goals Patient/Family Goal for Rehab: supervision to min assist with PT and OT Expected length of stay: ELOS 10 to 14 days Pt/Family Agrees to Admission and willing to participate: Yes Program Orientation Provided & Reviewed with Pt/Caregiver Including Roles  & Responsibilities: Yes   Decrease burden of Care through IP rehab admission: n/a   Possible need for SNF placement upon discharge: not anticipated   Patient Condition: I have reviewed medical records from [facility], spoken with CM, and patient and spouse. I discussed via phone for inpatient rehabilitation assessment.  Patient will benefit from ongoing PT and OT, can actively participate in 3 hours of therapy a day 5 days of the week, and can make measurable gains during the admission.  Patient will also benefit from the coordinated team approach during an Inpatient Acute Rehabilitation admission.  The patient will receive intensive therapy as well as Rehabilitation physician, nursing, social worker, and care management interventions.  Due to bladder management, bowel management, safety, skin/wound care, disease management, medication administration, pain management, and patient education the patient requires 24 hour a day rehabilitation nursing.  The patient is currently Mod assist overall with mobility  and basic ADLs.  Discharge setting and therapy post discharge at  home with home health is anticipated.  Patient has agreed to participate in the Acute Inpatient Rehabilitation Program and will admit today.  Preadmission Screen Completed By:  Cleatrice Burke, 10/06/2021 10:04 AM ______________________________________________________________________   Discussed status with Dr. Naaman Plummer on 10/06/2021 at 1004 and received approval for admission today.   Admission Coordinator:  Cleatrice Burke, RN, time 1004 Date 10/06/2021    Assessment/Plan: Diagnosis: left FNF/IT fx/ right IPR fx after fall Does the need for close, 24 hr/day Medical supervision in concert with the patient's rehab needs make it unreasonable for this patient to be served in a less intensive setting? Yes Co-Morbidities requiring supervision/potential complications: ESRD on HD, dm with neuropathy, anemia Due to bladder management, bowel management, safety, skin/wound care, disease management, medication administration, pain management, and patient education, does the patient require 24 hr/day rehab nursing? Yes Does the patient require coordinated care of a physician, rehab nurse, PT, OT,  to address physical and functional deficits in the context of the above medical diagnosis(es)? Yes Addressing deficits in the following areas: balance, endurance, locomotion, strength, transferring, bowel/bladder control, bathing, dressing, feeding, grooming, toileting, and psychosocial support Can the patient actively participate in an intensive therapy program of at least 3 hrs of therapy 5 days a week? Yes The potential for patient to make measurable gains while on inpatient rehab is excellent Anticipated functional outcomes upon discharge from inpatient rehab: supervision and min assist PT, supervision and min assist OT, n/a SLP Estimated rehab length of stay to reach the above functional goals is: 10-14 days Anticipated discharge destination: Home 10. Overall Rehab/Functional Prognosis: excellent      MD Signature: Meredith Staggers, MD, [place] Director Rehabilitation Services 10/06/2021          Revision History                               Note Details  Author Meredith Staggers, MD File Time 10/06/2021 10:27 AM  Author Type Physician Status Signed  Last Editor Meredith Staggers, MD Service Physical Medicine and [place] # [id number] Admit Date 10/06/2021

## 2021-10-07 ENCOUNTER — Encounter (HOSPITAL_COMMUNITY): Payer: Self-pay | Admitting: Physical Medicine and Rehabilitation

## 2021-10-07 ENCOUNTER — Inpatient Hospital Stay (HOSPITAL_COMMUNITY): Payer: Medicare Other

## 2021-10-07 DIAGNOSIS — S79002S Unspecified physeal fracture of upper end of left femur, sequela: Secondary | ICD-10-CM

## 2021-10-07 DIAGNOSIS — N186 End stage renal disease: Secondary | ICD-10-CM | POA: Diagnosis not present

## 2021-10-07 DIAGNOSIS — E1169 Type 2 diabetes mellitus with other specified complication: Secondary | ICD-10-CM | POA: Diagnosis not present

## 2021-10-07 LAB — GLUCOSE, CAPILLARY: Glucose-Capillary: 168 mg/dL — ABNORMAL HIGH (ref 70–99)

## 2021-10-07 IMAGING — DX DG KNEE 1-2V PORT*R*
1 series · 2 of 2 positions shown · non-contrast
Comparison: None.

CLINICAL DATA: Trauma, fall, pain

EXAM:
PORTABLE RIGHT KNEE - 1-2 VIEW

[Series 1: knee · 0.14mm/px · 2 of 2 slices shown]
[im 1/2]
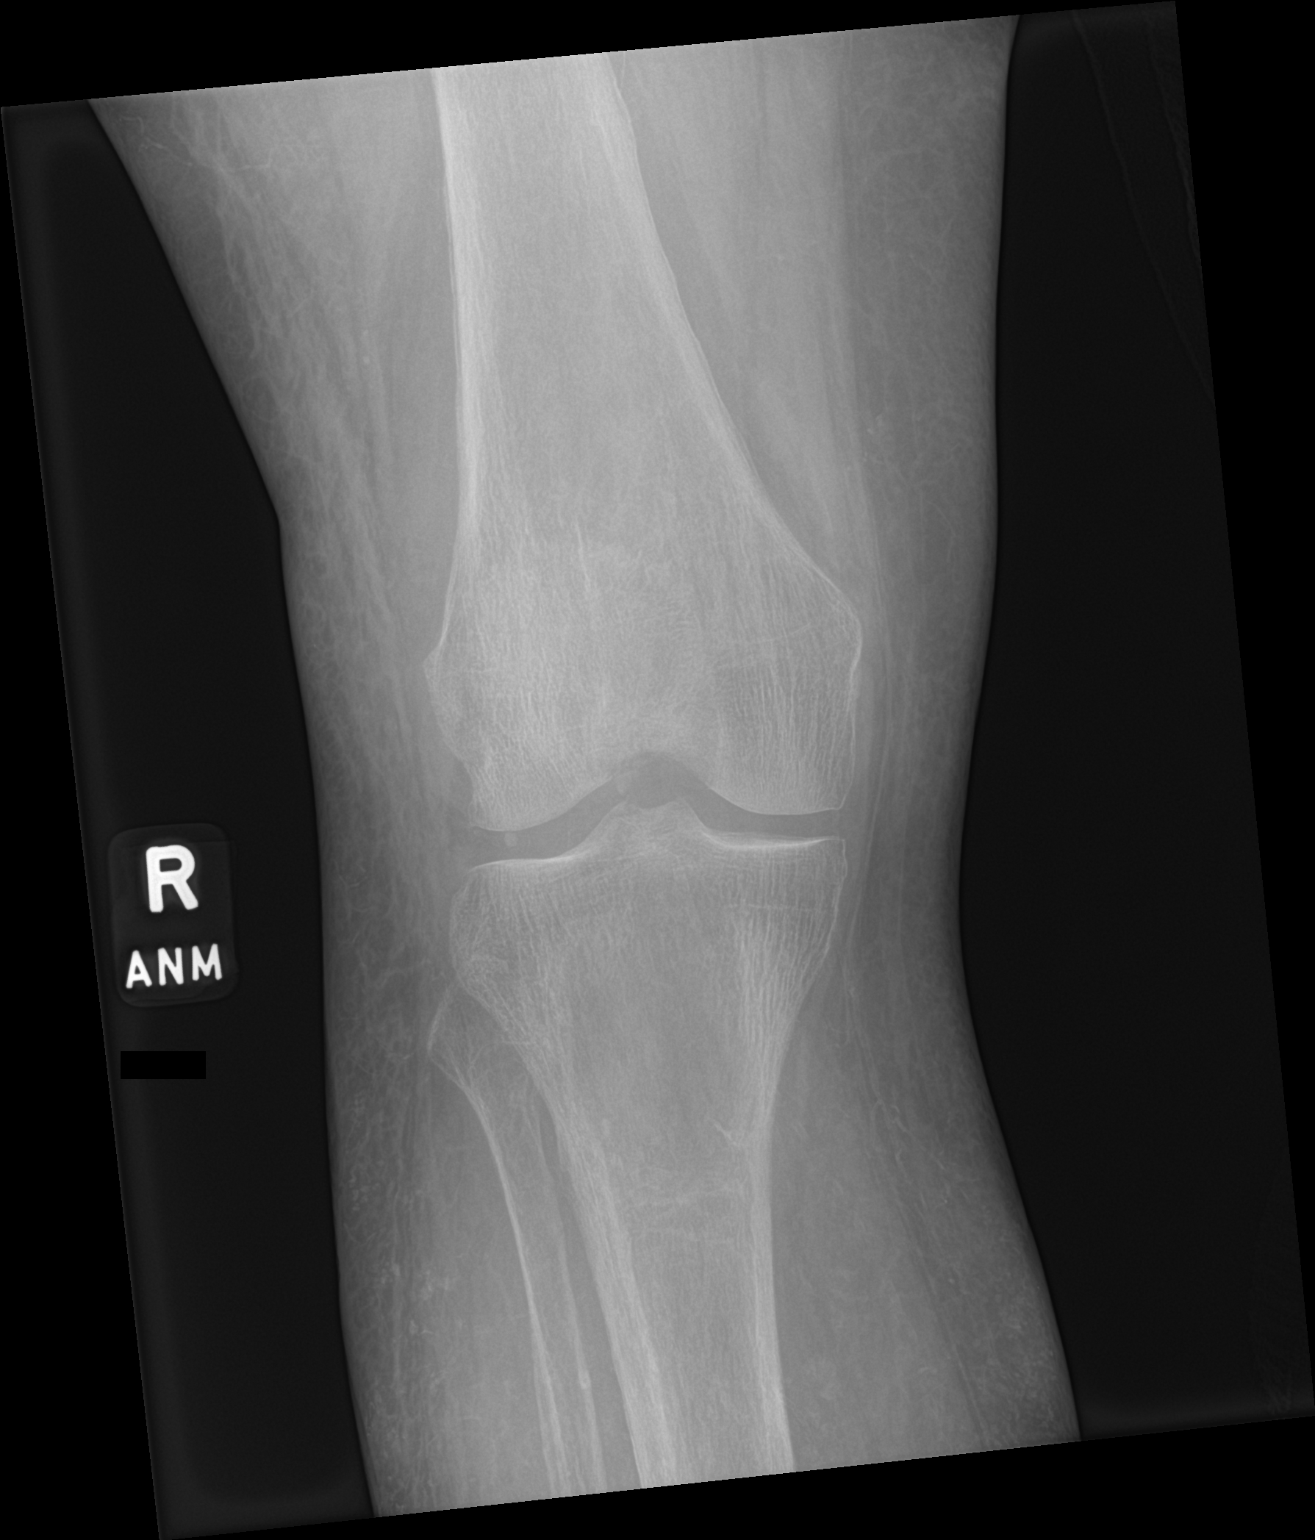
[im 2/2]
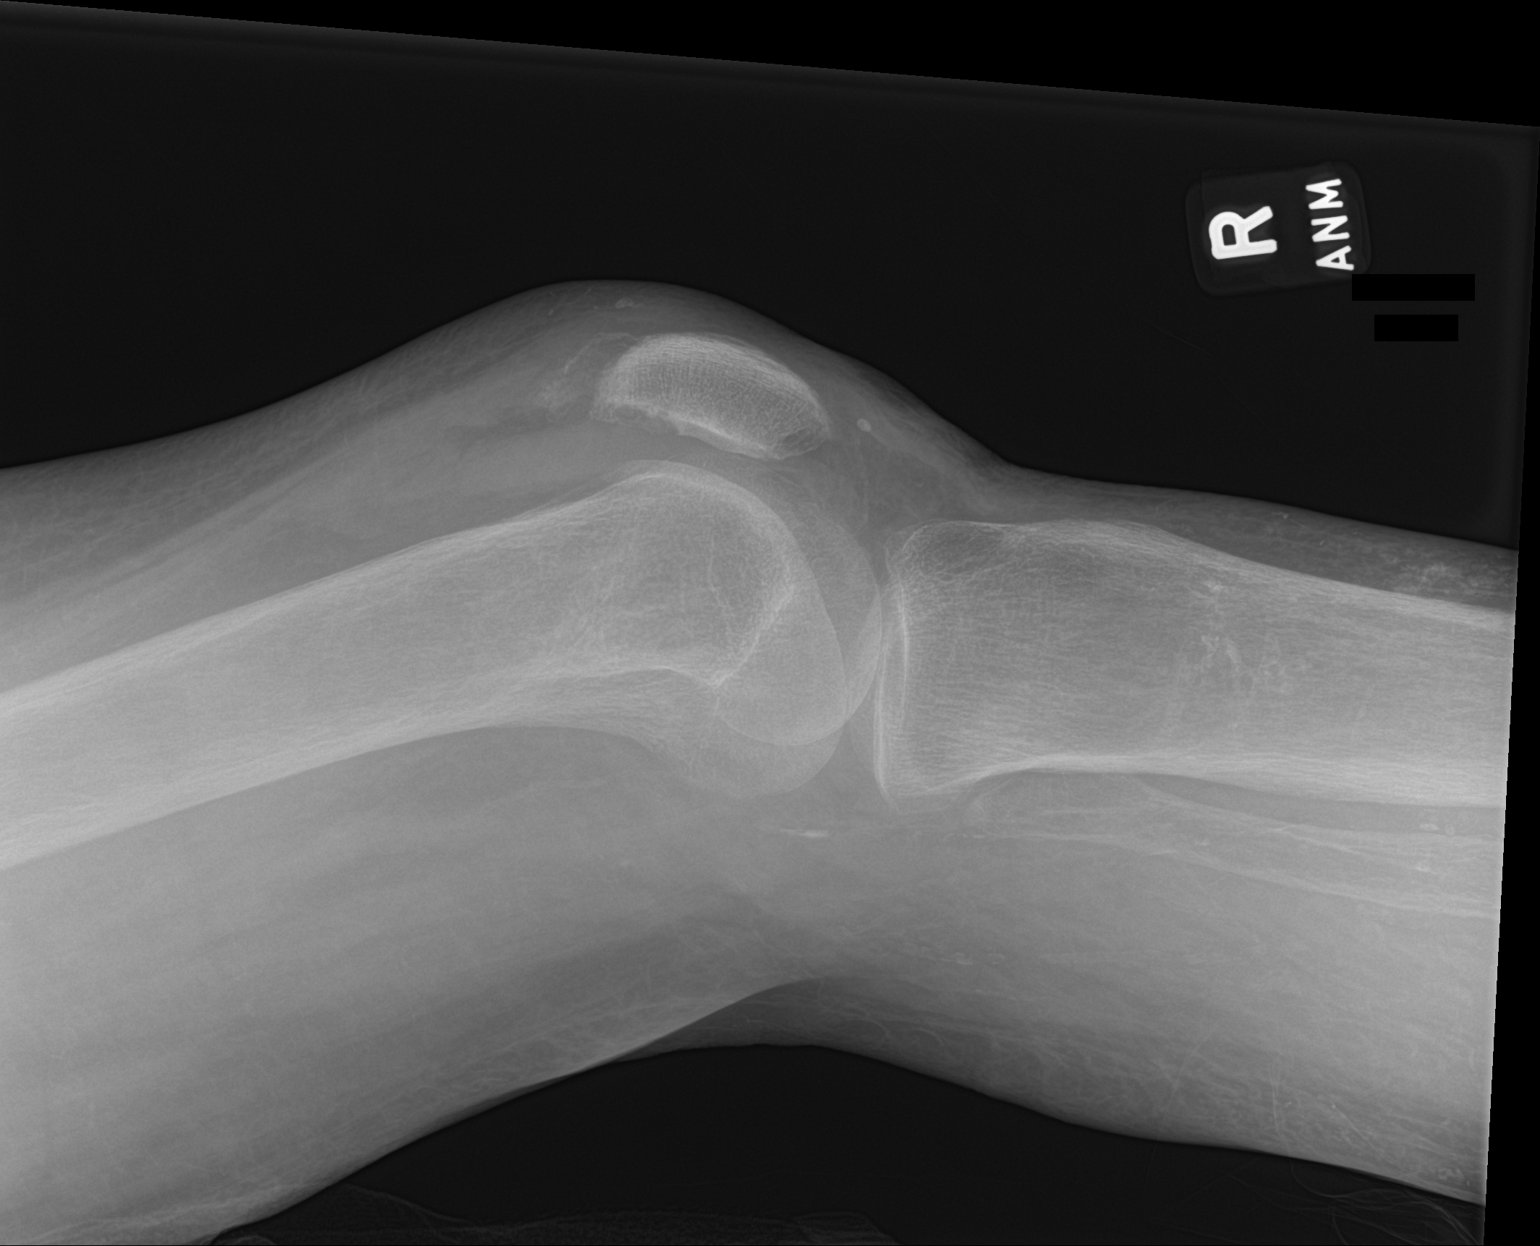

[2 of 2 positions shown; findings below may reference images not displayed]

FINDINGS: No recent fracture or dislocation is seen. There is small to
moderate effusion in the suprapatellar bursa. Erosive change seen in
the posterior margin of patella may be due to degenerative
arthritis. There are faint calcifications in the quadriceps tendon
close to patella, possibly calcific tendinosis or bursitis.
Osteopenia is seen in bony structures. In the AP view, there is
x 3.1 cm area of subtle decreased density with possible disruption
of trabeculae in the proximal shaft of tibia. There is no break in
the cortical margins.
IMPRESSION: No recent fracture or dislocation is seen. Small to moderate
effusion is present in the suprapatellar bursa.

There is ill-defined 4.2 x 3.1 cm area of decreased density with
possible disruption of trabeculae in the central portion of proximal
shaft of tibia. This may be due to osteopenia or suggest a
space-occupying lesion. Comparison with previous studies or
follow-up CT or MRI may be considered.

## 2021-10-07 MED ORDER — SENNA 8.6 MG PO TABS
2.0000 | ORAL_TABLET | Freq: Every day | ORAL | Status: DC
  Administered 2021-10-07 – 2021-10-09 (×2): 17.2 mg via ORAL
  Filled 2021-10-07 (×2): qty 2

## 2021-10-07 MED ORDER — ASCORBIC ACID 500 MG PO TABS
500.0000 mg | ORAL_TABLET | Freq: Every day | ORAL | Status: DC
  Administered 2021-10-07 – 2021-10-16 (×9): 500 mg via ORAL
  Filled 2021-10-07 (×10): qty 1

## 2021-10-07 MED ORDER — OXYCODONE HCL 5 MG PO TABS
5.0000 mg | ORAL_TABLET | ORAL | Status: DC | PRN
  Administered 2021-10-07 – 2021-10-15 (×10): 5 mg via ORAL
  Filled 2021-10-07 (×10): qty 1

## 2021-10-07 MED ORDER — POLYETHYLENE GLYCOL 3350 17 G PO PACK
17.0000 g | PACK | Freq: Every day | ORAL | Status: DC | PRN
  Administered 2021-10-07 – 2021-10-11 (×2): 17 g via ORAL
  Filled 2021-10-07 (×2): qty 1

## 2021-10-07 MED ORDER — HEPARIN SODIUM (PORCINE) 1000 UNIT/ML DIALYSIS
1500.0000 [IU] | INTRAMUSCULAR | Status: DC | PRN
  Administered 2021-10-08: 1500 [IU] via INTRAVENOUS_CENTRAL
  Filled 2021-10-07 (×3): qty 2

## 2021-10-07 MED ORDER — CHLORHEXIDINE GLUCONATE CLOTH 2 % EX PADS
6.0000 | MEDICATED_PAD | Freq: Every day | CUTANEOUS | Status: DC
  Administered 2021-10-08 – 2021-10-16 (×3): 6 via TOPICAL

## 2021-10-07 MED ORDER — CHLORHEXIDINE GLUCONATE CLOTH 2 % EX PADS
6.0000 | MEDICATED_PAD | Freq: Two times a day (BID) | CUTANEOUS | Status: DC
  Administered 2021-10-07 – 2021-10-14 (×11): 6 via TOPICAL

## 2021-10-07 MED ORDER — ACETAMINOPHEN 500 MG PO TABS
1000.0000 mg | ORAL_TABLET | Freq: Three times a day (TID) | ORAL | Status: DC
  Administered 2021-10-07 – 2021-10-16 (×24): 1000 mg via ORAL
  Filled 2021-10-07 (×25): qty 2

## 2021-10-07 NOTE — Progress Notes (Signed)
Physical Therapy Session Note  Patient Details  Name: [name] MRN: [id number] Date of Birth: [birth year]  Today's Date: 10/07/2021 PT Individual Time: 9038-3338 PT Individual Time Calculation (min): 24 min   Short Term Goals: Week 1:  PT Short Term Goal 1 (Week 1): Pt will transfer sit<>stand with LRAD and max A of 1 PT Short Term Goal 2 (Week 1): Pt will transfer bed<>chair with LRAD and max A of 1 PT Short Term Goal 3 (Week 1): Pt will initate gait training  Skilled Therapeutic Interventions/Progress Updates:   Received pt semi-reclined in bed with husband present at bedside. Pt reported pain 10/10 in R knee and 8/10 in L knee and hip (premedicated). Pt also reported just having x-ray taken and politely declined any further exercises. RN present to administer medications and deliver k-pad. While heating pad warmed up, discussed pt's "traumatic" event with the commode from earlier this morning and discussed new strategies to implement tomorrow to reduce anxiety and promote stability. Pt hopeful that starting a new day of therapy tomorrow will help. Discussed proper body mechanics/technique for standing in [place] and reached out to MD to order bilateral knee sleeves. Concluded session with pt semi-reclined in bed, needs within reach, and bed alarm on. Husband present at bedside.   Therapy Documentation Precautions:  Restrictions Weight Bearing Restrictions: Yes RLE Weight Bearing: Weight bearing as tolerated LLE Weight Bearing: Weight bearing as tolerated  Therapy/Group: Individual Therapy Alfonse Alpers PT, DPT   10/07/2021, 7:16 AM

## 2021-10-07 NOTE — Progress Notes (Signed)
Inpatient Rehabilitation  Patient information reviewed and entered into eRehab system by Gabryelle Whitmoyer M. Tanga Gloor, M.A., CCC/SLP, PPS Coordinator.  Information including medical coding, functional ability and quality indicators will be reviewed and updated through discharge.    

## 2021-10-07 NOTE — Evaluation (Signed)
Physical Therapy Assessment and Plan  Patient Details  Name: [name] MRN: [id number] Date of Birth: 12-04-1965  PT Diagnosis: Abnormal posture, Abnormality of gait, Difficulty walking, Edema, Impaired sensation, Muscle weakness, and Pain in L hip, R knee Rehab Potential: Fair ELOS: 3 weeks   Today's Date: 10/07/2021 PT Individual Time: 1030-1126 PT Individual Time Calculation (min): 56 min    Hospital Problem: Principal Problem:   Femur fracture ([place])   Past Medical History:  Past Medical History:  Diagnosis Date   Anemia    vitamin d3 deficiency   Anxiety    Chronic kidney disease    End Stage Renal Disease   Diabetes mellitus without complication ([place])    GERD (gastroesophageal reflux disease)    nothing over last few years   High serum parathyroid hormone (PTH)    checked through Dialysis   History of kidney stones 2000   Hypertension    Neuromuscular disorder ([place])    neuropathy in feet   PONV (postoperative nausea and vomiting)    severe nausea requiring many doses of post op antiemetics   Stroke ([place]) 10/2017   thinks she had a series of mini strokes.right leg up to right side of face were numb. no loss of consciousness   Past Surgical History:  Past Surgical History:  Procedure Laterality Date   ABDOMINAL HYSTERECTOMY  2007   AV FISTULA INSERTION W/ RF MAGNETIC GUIDANCE N/A 12/08/2017   Procedure: AV FISTULA INSERTION W/RF MAGNETIC GUIDANCE;  Surgeon: Katha Cabal, MD;  Location: [facility];  Service: Cardiovascular;  Laterality: N/A;   DIALYSIS/PERMA CATHETER INSERTION Right 10/03/2021   Procedure: DIALYSIS/PERMA CATHETER INSERTION;  Surgeon: Katha Cabal, MD;  Location: [facility];  Service: Cardiovascular;  Laterality: Right;   HIP ARTHROPLASTY Left 09/29/2021   Procedure: ARTHROPLASTY BIPOLAR HIP (HEMIARTHROPLASTY);  Surgeon: Lovell Sheehan, MD;  Location: [facility];  Service: Orthopedics;  Laterality: Left;   ROTATOR CUFF  REPAIR Right 2004   SHOULDER CLOSED REDUCTION Right 2004   UPPER EXTREMITY VENOGRAPHY Left 02/15/2018   Procedure: UPPER EXTREMITY VENOGRAPHY;  Surgeon: Katha Cabal, MD;  Location: [facility];  Service: Cardiovascular;  Laterality: Left;    Assessment & Plan Clinical Impression: Patient is a 56 y.o. year old female with history of end-stage renal disease with hemodialysis via right IJ, diabetes mellitus with neuropathy, acute on chronic anemia, hyperlipidemia, obesity with BMI 27.76, secondary hyperparathyroidism of renal origin and currently awaiting outpatient scheduling for evaluation of parathyroidectomy, hypertension.  Per chart review patient lives with spouse.  1 level home 5 steps to entry.  Modified independent with Rollator for household and community ambulation and bilateral axillary crutches for stair negotiation.  Presented to [facility] 09/28/2021 with complaints of severe left hip pain.  Patient states her leg gave out and she heard a pop on the left hip.  She also had complaints of right side hip pain from a fall.  X-rays and imaging revealed displaced foreshortened fracture of the neck and intertrochanteric left femur as well as acute nondisplaced fracture of the right inferior pubic ramus.  Patient underwent left hip anterior hip hemiarthroplasty 09/29/2021 per Dr. Kurtis Bushman.  Conservative care of right inferior pubic ramus fracture.  Patient is currently weightbearing as tolerated left lower extremity as well as weightbearing as tolerated right lower extremity.  Hospital course hemodialysis ongoing vascular surgery Dr Rica Koyanagi consulted to evaluate for exchange PermCath from right IJ to better dialysis 10/03/2021.  Hospital  course acute on chronic anemia latest hemoglobin 6.7 of which she was transfused and follow-up hemoglobin 7.7 and patient also remains on iron supplement.  Therapy evaluations completed due to patient decreased functional mobility was admitted for a  comprehensive rehab program  Patient currently requires total with mobility secondary to muscle weakness and muscle joint tightness, decreased cardiorespiratoy endurance, and decreased standing balance, decreased postural control, and decreased balance strategies.  Prior to hospitalization, patient was modified independent  with mobility and lived with Spouse in a House home.  Home access is 5Stairs to enter.  Patient will benefit from skilled PT intervention to maximize safe functional mobility, minimize fall risk, and decrease caregiver burden for planned discharge home with intermittent assist.  Anticipate patient will benefit from follow up [facility] at discharge.  PT - End of Session Activity Tolerance: Tolerates 30+ min activity with multiple rests Endurance Deficit: Yes Endurance Deficit Description: required frequent breaks PT Assessment Rehab Potential (ACUTE/IP ONLY): Fair PT Barriers to Discharge: [place] home environment;Decreased caregiver support;Home environment access/layout;Wound Care;Hemodialysis;Other (comments) PT Barriers to Discharge Comments: pain, 5 STE with 2 rails (using crutches previously), husband can only provide intermittent assist, anxiety, weakness. PT Patient demonstrates impairments in the following area(s): Balance;Behavior;Edema;Endurance;Motor;Nutrition;Pain;Skin Integrity PT Transfers Functional Problem(s): Bed Mobility;Bed to Chair;Car;Furniture PT Locomotion Functional Problem(s): Ambulation;Wheelchair Mobility;Stairs PT Plan PT Intensity: Minimum of 1-2 x/day ,45 to 90 minutes PT Frequency: 5 out of 7 days PT Duration Estimated Length of Stay: 3 weeks PT Treatment/Interventions: Ambulation/gait training;Discharge planning;Functional mobility training;Psychosocial support;Therapeutic Activities;Balance/vestibular training;Disease management/prevention;Neuromuscular re-education;Skin care/wound management;Therapeutic Exercise;Wheelchair  propulsion/positioning;Cognitive remediation/compensation;DME/adaptive equipment instruction;Pain management;Splinting/orthotics;UE/LE Strength taining/ROM;Community reintegration;Patient/family education;Stair training;UE/LE Coordination activities PT Transfers Anticipated Outcome(s): supervision with LRAD PT Locomotion Anticipated Outcome(s): CGA with LRAD PT Recommendation Recommendations for Other Services: Therapeutic Recreation consult Therapeutic Recreation Interventions: Stress management Follow Up Recommendations: Home health PT Patient destination: Home Equipment Recommended: To be determined Equipment Details: has rollator and crutches   PT Evaluation Precautions/Restrictions Precautions Precautions: Fall Precaution Comments: L AVF, R IJ perm-cath ( will need new dialysis cath) Restrictions Weight Bearing Restrictions: Yes RLE Weight Bearing: Weight bearing as tolerated LLE Weight Bearing: Weight bearing as tolerated Pain Interference Pain Interference Pain Effect on Sleep: 1. Rarely or not at all Pain Interference with Therapy Activities: 1. Rarely or not at all Pain Interference with Day-to-Day Activities: 2. Occasionally Home Living/Prior Functioning Home Living Available Help at Discharge: Family;Available PRN/intermittently;Neighbor Type of Home: House Home Access: Stairs to enter Technical brewer of Steps: 5 Entrance Stairs-Rails: Right;Left Home Layout: One level Bathroom Shower/Tub: Multimedia programmer: Standard Bathroom Accessibility: Yes Additional Comments: using rollator for past 3-4 months and using crutches for stairs  Lives With: Spouse Prior Function Level of Independence: Requires assistive device for independence;Independent with basic ADLs;Needs assistance with homemaking  Able to Take Stairs?: Yes Driving: Yes Vocation: Retired Vision/Perception  Vision - History Ability to See in Adequate Light: 0  Adequate Perception Perception: Within Functional Limits Praxis Praxis: Intact  Cognition Overall Cognitive Status: Within Functional Limits for tasks assessed Arousal/Alertness: Awake/alert Orientation Level: Oriented X4 Memory: Appears intact Awareness: Appears intact Problem Solving: Appears intact Safety/Judgment: Appears intact Comments: anxious Sensation Sensation Light Touch: Appears Intact Peripheral sensation comments: absent sensation along L great toe Hot/Cold: Appears Intact Proprioception: Appears Intact Coordination Gross Motor Movements are Fluid and Coordinated: No Fine Motor Movements are Fluid and Coordinated: Yes Coordination and Movement Description: Limited by pain Finger Nose Finger Test: [facility] Heel Shin Test: decreased ROM on RLE and unable to lift  LLE Motor  Motor Motor: Abnormal postural alignment and control Motor - Skilled Clinical Observations: grossly uncoordinated due to pain, weakness, anxiety, and decreased balance/postural control  Trunk/Postural Assessment  Cervical Assessment Cervical Assessment: Within Functional Limits Thoracic Assessment Thoracic Assessment: Exceptions to [facility] (rounded shoulders) Lumbar Assessment Lumbar Assessment: Exceptions to [facility] (posterior pelvic tilt) Postural Control Postural Control: Deficits on evaluation Righting Reactions: delayed  Balance Balance Balance Assessed: Yes Static Sitting Balance Static Sitting - Balance Support: Feet supported;Bilateral upper extremity supported Static Sitting - Level of Assistance: 6: Modified independent (Device/Increase time) Dynamic Sitting Balance Dynamic Sitting - Balance Support: Feet supported;No upper extremity supported Dynamic Sitting - Level of Assistance: 5: Stand by assistance (supervision) Static Standing Balance Static Standing - Balance Support: Bilateral upper extremity supported (Stedy) Static Standing - Level of Assistance: 1: +2 Total assist Extremity  Assessment  RLE Assessment RLE Assessment: Exceptions to [facility] RLE Strength Right Hip Flexion: 3-/5 Right Hip ABduction: 3-/5 Right Hip ADduction: 3-/5 Right Knee Flexion: 3-/5 Right Knee Extension: 3+/5 Right Ankle Dorsiflexion: 3/5 Right Ankle Plantar Flexion: 3/5 LLE Assessment LLE Assessment: Exceptions to [facility] LLE Strength Left Hip Flexion: 2+/5 Left Hip ABduction: 2+/5 Left Hip ADduction: 2+/5 Left Knee Flexion: 2+/5 Left Knee Extension: 2+/5 Left Ankle Dorsiflexion: 3/5 Left Ankle Plantar Flexion: 3/5  Care Tool Care Tool Bed Mobility Roll left and right activity        Sit to lying activity        Lying to sitting on side of bed activity         Care Tool Transfers Sit to stand transfer   Sit to stand assist level: 2 Helpers    Chair/bed transfer   Chair/bed transfer assist level: Dependent - mechanical lift     Toilet transfer   Assist Level: 2 Production assistant, radio transfer activity did not occur: Safety/medical concerns (weakness, fatigue, pain, anxiety)        Care Tool Locomotion Ambulation Ambulation activity did not occur: Safety/medical concerns (weakness, fatigue, pain, anxiety)        Walk 10 feet activity Walk 10 feet activity did not occur: Safety/medical concerns (weakness, fatigue, pain, anxiety)       Walk 50 feet with 2 turns activity Walk 50 feet with 2 turns activity did not occur: Safety/medical concerns (weakness, fatigue, pain, anxiety)      Walk 150 feet activity Walk 150 feet activity did not occur: Safety/medical concerns (weakness, fatigue, pain, anxiety)      Walk 10 feet on uneven surfaces activity Walk 10 feet on uneven surfaces activity did not occur: Safety/medical concerns (weakness, fatigue, pain, anxiety)      Stairs Stair activity did not occur: Safety/medical concerns (weakness, fatigue, pain, anxiety)        Walk up/down 1 step activity Walk up/down 1 step or curb (drop down) activity did not occur:  Safety/medical concerns (weakness, fatigue, pain, anxiety)      Walk up/down 4 steps activity Walk up/down 4 steps activity did not occur: Safety/medical concerns (weakness, fatigue, pain, anxiety)      Walk up/down 12 steps activity Walk up/down 12 steps activity did not occur: Safety/medical concerns (weakness, fatigue, pain, anxiety)      Pick up small objects from floor Pick up small object from the floor (from standing position) activity did not occur: Safety/medical concerns (weakness, fatigue, pain, anxiety)      Wheelchair Is the patient using a wheelchair?: Yes Type of Wheelchair: Manual Wheelchair activity  did not occur: Safety/medical concerns (weakness, fatigue, pain, anxiety)      Wheel 50 feet with 2 turns activity Wheelchair 50 feet with 2 turns activity did not occur: Safety/medical concerns (weakness, fatigue, pain, anxiety)    Wheel 150 feet activity Wheelchair 150 feet activity did not occur: Safety/medical concerns (weakness, fatigue, pain, anxiety)      Refer to Care Plan for Long Term Goals  SHORT TERM GOAL WEEK 1 PT Short Term Goal 1 (Week 1): Pt will transfer sit<>stand with LRAD and max A of 1 PT Short Term Goal 2 (Week 1): Pt will transfer bed<>chair with LRAD and max A of 1 PT Short Term Goal 3 (Week 1): Pt will initate gait training  Recommendations for other services: Therapeutic Recreation  Stress management  Skilled Therapeutic Intervention Evaluation completed (see details above and below) with education on PT POC and goals and individual treatment initiated with focus on functional mobility/transfers, toileting, and improved activity tolerance. Received pt sitting EOB, pt educated on PT evaluation, CIR policies, and therapy schedule and agreeable. Pt reported pain 8-9/10 with mobility (premedicated). Repositioning, rest breaks, and distraction done to reduce pain levels. Pt unable to scoot further to EOB due to pain and unwilling to let therapist  assist with scooting. Initially planned to try to stand with RW, however due to increased pain and anxiety and urge to have BM, utilized Stedy. Pt unable to stand in [place] from elevated EOB despite max/total A of 1 and required multiple attempts and max A +2 to stand from elevated EOB in [place] - unable to achieve full hip extension leaning over [place] bar. Dependent transfer to bedside commdoe and required mod A+2 for eccentric descent onto commode - immediately crying due to increased pain in knees. RN present to adminster suppository and pt transferred sit<>semi-stand in [place] with max A +2 while RN administered suppository. Upon sitting pt reported R knee "giving out" and began crying louder in pain - rest breaks and cues for pursed lip breathing to calm pt.  Concluded session with pt sitting on bedside commode - handoff to NT in preparation from OT evaluation. Communicated to RN and NT need for lift equipment for transfers.   Mobility Transfers Transfers: Sit to Stand;Stand to Sit;Stand Pivot Transfers Sit to Stand: 2 Helpers Stand to Sit: 2 Helpers Stand Pivot Transfers: Dependent - mechanical lift Transfer via Lift Equipment: Probation officer Ambulation: No Gait Gait: No Stairs / Additional Locomotion Stairs: No Wheelchair Mobility Wheelchair Mobility: No   Discharge Criteria: Patient will be discharged from PT if patient refuses treatment 3 consecutive times without medical reason, if treatment goals not met, if there is a change in medical status, if patient makes no progress towards goals or if patient is discharged from hospital.  The above assessment, treatment plan, treatment alternatives and goals were discussed and mutually agreed upon: by patient and by family  Alfonse Alpers PT, DPT  10/07/2021, 12:15 PM

## 2021-10-07 NOTE — Evaluation (Signed)
Occupational Therapy Assessment and Plan  Patient Details  Name: [name] MRN: [id number] Date of Birth: [birth year]  OT Diagnosis: acute pain and muscle weakness (generalized) Rehab Potential: Rehab Potential (ACUTE ONLY): Fair ELOS: 3+ weeks   Today's Date: 10/07/2021 OT Individual Time: 1140-1230 OT Individual Time Calculation (min): 50 min     Hospital Problem: Principal Problem:   Femur fracture ([place])   Past Medical History:  Past Medical History:  Diagnosis Date   Anemia    vitamin d3 deficiency   Anxiety    Chronic kidney disease    End Stage Renal Disease   Diabetes mellitus without complication (HCC)    GERD (gastroesophageal reflux disease)    nothing over last few years   High serum parathyroid hormone (PTH)    checked through Dialysis   History of kidney stones 2000   Hypertension    Neuromuscular disorder ([place])    neuropathy in feet   PONV (postoperative nausea and vomiting)    severe nausea requiring many doses of post op antiemetics   Stroke ([place]) 10/2017   thinks she had a series of mini strokes.right leg up to right side of face were numb. no loss of consciousness   Past Surgical History:  Past Surgical History:  Procedure Laterality Date   ABDOMINAL HYSTERECTOMY  2007   AV FISTULA INSERTION W/ RF MAGNETIC GUIDANCE N/A 12/08/2017   Procedure: AV FISTULA INSERTION W/RF MAGNETIC GUIDANCE;  Surgeon: Katha Cabal, MD;  Location: [place] [facility];  Service: Cardiovascular;  Laterality: N/A;   DIALYSIS/PERMA CATHETER INSERTION Right 10/03/2021   Procedure: DIALYSIS/PERMA CATHETER INSERTION;  Surgeon: Katha Cabal, MD;  Location: [place] [facility];  Service: Cardiovascular;  Laterality: Right;   HIP ARTHROPLASTY Left 09/29/2021   Procedure: ARTHROPLASTY BIPOLAR HIP (HEMIARTHROPLASTY);  Surgeon: Lovell Sheehan, MD;  Location: [facility];  Service: Orthopedics;  Laterality: Left;   ROTATOR CUFF REPAIR Right 2004   SHOULDER CLOSED REDUCTION  Right 2004   UPPER EXTREMITY VENOGRAPHY Left 02/15/2018   Procedure: UPPER EXTREMITY VENOGRAPHY;  Surgeon: Katha Cabal, MD;  Location: [place] [facility];  Service: Cardiovascular;  Laterality: Left;    Assessment & Plan Clinical Impression: [name] is a 56 year old right-handed female with history of end-stage renal disease with hemodialysis via right IJ, diabetes mellitus with neuropathy, acute on chronic anemia, hyperlipidemia, obesity with BMI 27.76, secondary hyperparathyroidism of renal origin and currently awaiting outpatient scheduling for evaluation of parathyroidectomy, hypertension.  Per chart review patient lives with spouse.  1 level home 5 steps to entry.  Modified independent with Rollator for household and community ambulation and bilateral axillary crutches for stair negotiation.  Presented to [facility] 09/28/2021 with complaints of severe left hip pain.  Patient states her leg gave out and she heard a pop on the left hip.  She also had complaints of right side hip pain from a fall.  X-rays and imaging revealed displaced foreshortened fracture of the neck and intertrochanteric left femur as well as acute nondisplaced fracture of the right inferior pubic ramus.  Patient underwent left hip anterior hip hemiarthroplasty 09/29/2021 per Dr. Kurtis Bushman.  Conservative care of right inferior pubic ramus fracture.  Patient is currently weightbearing as tolerated left lower extremity as well as weightbearing as tolerated right lower extremity.  Hospital course hemodialysis ongoing vascular surgery Dr Rica Koyanagi consulted to evaluate for exchange PermCath from right IJ to better dialysis 10/03/2021.  Hospital course acute on chronic anemia latest hemoglobin 6.7 of  which she was transfused and follow-up hemoglobin 7.7 and patient also remains on iron supplement.  Therapy evaluations completed due to patient decreased functional mobility was admitted for a comprehensive rehab programPatient  transferred to CIR on 10/06/2021 .    Patient currently requires total with basic self-care skills secondary to muscle weakness, decreased cardiorespiratoy endurance, and decreased standing balance and decreased balance strategies.  Prior to hospitalization, patient could complete ADLs with modified independent .  Patient will benefit from skilled intervention to decrease level of assist with basic self-care skills and increase level of independence with iADL prior to discharge home with care partner.  Anticipate patient will require 24 hour supervision and follow up home health.  OT - End of Session Activity Tolerance: Improving;Tolerates 10 - 20 min activity with multiple rests Endurance Deficit: Yes Endurance Deficit Description: required frequent breaks OT Assessment Rehab Potential (ACUTE ONLY): Fair OT Barriers to Discharge: Decreased caregiver support OT Patient demonstrates impairments in the following area(s): Balance;Cognition;Endurance;Motor;Pain;Safety;Sensory OT Basic ADL's Functional Problem(s): Bathing;Dressing;Toileting OT Transfers Functional Problem(s): Toilet;Tub/Shower OT Additional Impairment(s): None OT Plan OT Intensity: Minimum of 1-2 x/day, 45 to 90 minutes OT Frequency: 5 out of 7 days OT Duration/Estimated Length of Stay: 3+ weeks OT Treatment/Interventions: Balance/vestibular training;DME/adaptive equipment instruction;Patient/family education;Therapeutic Activities;Psychosocial support;Therapeutic Exercise;Functional mobility training;Self Care/advanced ADL retraining;UE/LE Strength taining/ROM;Discharge planning;Pain management;UE/LE Coordination activities OT Self Feeding Anticipated Outcome(s): no goal set OT Basic Self-Care Anticipated Outcome(s): (S) OT Toileting Anticipated Outcome(s): (S) OT Bathroom Transfers Anticipated Outcome(s): (S) OT Recommendation Patient destination: Home Follow Up Recommendations: Home health OT Equipment Recommended: To be  determined   OT Evaluation Precautions/Restrictions  Precautions Precautions: Fall Precaution Comments: L AVF, R IJ perm-cath ( will need new dialysis cath) Restrictions Weight Bearing Restrictions: Yes RLE Weight Bearing: Weight bearing as tolerated LLE Weight Bearing: Weight bearing as tolerated General Chart Reviewed: Yes Family/Caregiver Present: Yes (husband Gaspar Bidding)  Pain Pain Assessment Pain Scale: 0-10 Pain Score: 10-Worst pain ever Pain Type: Acute pain Pain Location: Knee Pain Orientation: Right Pain Descriptors / Indicators: Aching Pain Onset: On-going Pain Intervention(s): Medication (See eMAR) Home Living/Prior Functioning Home Living Available Help at Discharge: Family, Available PRN/intermittently, Neighbor Type of Home: House Home Access: Stairs to enter Technical brewer of Steps: 5 Entrance Stairs-Rails: Right, Left Home Layout: One level Bathroom Shower/Tub: Multimedia programmer: Standard Bathroom Accessibility: Yes Additional Comments: using rollator for past 3-4 months and using crutches for stairs  Lives With: Spouse IADL History Homemaking Responsibilities: Yes Meal Prep Responsibility: Secondary Laundry Responsibility: Secondary Cleaning Responsibility: Secondary Bill Paying/Finance Responsibility: Secondary Shopping Responsibility: Secondary Current License: Yes Mode of Transportation: Car Occupation: Retired Prior Function Level of Independence: Requires assistive device for independence, Independent with basic ADLs, Needs assistance with homemaking  Able to Take Stairs?: Yes Driving: Yes Vocation: Retired Surveyor, mining Baseline Vision/History: 1 Wears glasses Ability to See in Adequate Light: 0 Adequate Patient Visual Report: No change from baseline Vision Assessment?: No apparent visual deficits Perception  Perception: Within Functional Limits Praxis Praxis: Intact Cognition Overall Cognitive Status: Within Functional  Limits for tasks assessed Arousal/Alertness: Awake/alert Orientation Level: Person;Situation;Place Person: Oriented Place: Oriented Situation: Oriented Year: 2023 Month: February Day of Week: Correct Memory: Appears intact Immediate Memory Recall: Sock;Bed;Blue Memory Recall Sock: Without Cue Memory Recall Blue: Without Cue Memory Recall Bed: Without Cue Attention: Selective Selective Attention: Appears intact Awareness: Appears intact Problem Solving: Appears intact Safety/Judgment: Appears intact Comments: anxious Sensation Sensation Light Touch: Appears Intact Peripheral sensation comments: absent sensation along L great toe  Hot/Cold: Appears Intact Proprioception: Appears Intact Coordination Gross Motor Movements are Fluid and Coordinated: No Fine Motor Movements are Fluid and Coordinated: Yes Coordination and Movement Description: Limited by pain Finger Nose Finger Test: [facility] Heel Shin Test: decreased ROM on RLE and unable to lift LLE Motor  Motor Motor: Abnormal postural alignment and control Motor - Skilled Clinical Observations: grossly uncoordinated due to pain, weakness, anxiety, and decreased balance/postural control  Trunk/Postural Assessment  Cervical Assessment Cervical Assessment: Within Functional Limits Thoracic Assessment Thoracic Assessment: Exceptions to [facility] (rounded shoulders) Lumbar Assessment Lumbar Assessment: Exceptions to [facility] (posterior pelvic tilt) Postural Control Postural Control: Deficits on evaluation Righting Reactions: delayed  Balance Balance Balance Assessed: Yes Static Sitting Balance Static Sitting - Balance Support: Feet supported;Bilateral upper extremity supported Static Sitting - Level of Assistance: 6: Modified independent (Device/Increase time) Dynamic Sitting Balance Dynamic Sitting - Balance Support: Feet supported;No upper extremity supported Dynamic Sitting - Level of Assistance: 5: Stand by assistance Static  Standing Balance Static Standing - Balance Support: Bilateral upper extremity supported Static Standing - Level of Assistance: 1: +2 Total assist Extremity/Trunk Assessment RUE Assessment RUE Assessment: Exceptions to [facility] General Strength Comments: generalized weakness LUE Assessment LUE Assessment: Exceptions to [facility] General Strength Comments: generalized weakness  Care Tool Care Tool Self Care Eating   Eating Assist Level: Set up assist    Oral Care    Oral Care Assist Level: Set up assist    Bathing Bathing activity did not occur: Safety/medical concerns            Upper Body Dressing(including orthotics)   What is the patient wearing?: Pull over shirt   Assist Level: Supervision/Verbal cueing    Lower Body Dressing (excluding footwear)   What is the patient wearing?: Pants Assist for lower body dressing: 2 Helpers    Putting on/Taking off footwear   What is the patient wearing?: Non-skid slipper socks Assist for footwear: 2 Helpers       Care Tool Toileting Toileting activity   Assist for toileting: 2 Helpers     Care Tool Bed Mobility Roll left and right activity   Roll left and right assist level: 2 Helpers    Sit to lying activity   Sit to lying assist level: 2 Helpers    Lying to sitting on side of bed activity   Lying to sitting on side of bed assist level: the ability to move from lying on the back to sitting on the side of the bed with no back support.: 2 Helpers     Care Tool Transfers Sit to stand transfer   Sit to stand assist level: 2 Helpers    Chair/bed transfer   Chair/bed transfer assist level: Dependent - mechanical lift     Toilet transfer   Assist Level: 2 Helpers     Care Tool Cognition  Expression of Ideas and Wants Expression of Ideas and Wants: 4. Without difficulty (complex and basic) - expresses complex messages without difficulty and with speech that is clear and easy to understand  Understanding Verbal and Non-Verbal  Content Understanding Verbal and Non-Verbal Content: 3. Usually understands - understands most conversations, but misses some part/intent of message. Requires cues at times to understand   Memory/Recall Ability Memory/Recall Ability : That he or she is in a hospital/hospital unit   Refer to Care Plan for Long Term Goals  SHORT TERM GOAL WEEK 1 OT Short Term Goal 1 (Week 1): Pt will stand with max A of 1 in  the stedy OT Short Term Goal 2 (Week 1): Pt will improve pain management <8/10 during ADLs OT Short Term Goal 3 (Week 1): Pt will complete LB dressing with max A of 1  Recommendations for other services: None    Skilled Therapeutic Intervention ADL ADL Eating: Set up Where Assessed-Eating: Edge of bed Grooming: Setup;Supervision/safety Where Assessed-Grooming: Edge of bed Upper Body Bathing: Unable to assess Lower Body Bathing: Unable to assess Upper Body Dressing: Supervision/safety Where Assessed-Upper Body Dressing: Edge of bed Lower Body Dressing: Dependent Toileting: Dependent Where Assessed-Toileting: Bedside Commode Toilet Transfer: Dependent Tub/Shower Transfer: Unable to assess Tub/Shower Transfer Method: Unable to assess Social research officer, government: Unable to assess Social research officer, government Method: Unable to assess Mobility  Bed Mobility Bed Mobility: Sit to Supine;Supine to Sit Supine to Sit: 2 Helpers Sit to Supine: 2 Helpers Transfers Sit to Stand: 2 Helpers;Dependent - mechanical lift Stand to Sit: 2 Helpers;Dependent - mechanical lift  Skilled OT evaluation completed but very limited by extremely high levels of pain. Pt received on the [facility] attempting to further void hard impacted stool. Gathered information from her and her husband present while she finished voiding re PLOF, occupational profile, and rehab expectations/OT POC. Pt required total A +3 to stand from the [facility]. Extensive collaboration with nursing staff re safe transfers- needs to be maximove only at  this time. Pt in extremly high pain- screaming out the entire time any transfer was happening and shaking. She was transferred back to EOB and then to supine with similar pain response. LPN messaged MD in re to this uncontrolled pain. Cueing provided for body mechanics. Pt donned shirt and shorts as described above. Pt left supine with all needs met, LPN present to administer medication.    Discharge Criteria: Patient will be discharged from OT if patient refuses treatment 3 consecutive times without medical reason, if treatment goals not met, if there is a change in medical status, if patient makes no progress towards goals or if patient is discharged from hospital.  The above assessment, treatment plan, treatment alternatives and goals were discussed and mutually agreed upon: by patient  Curtis Sites 10/07/2021, 1:00 PM

## 2021-10-07 NOTE — Discharge Instructions (Signed)
Inpatient Rehab Discharge Instructions  [name] Discharge date and time: No discharge date for patient encounter.   Activities/Precautions/ Functional Status: Activity:  Weightbearing as tolerated left lower extremity with anterior hip precautions Diet: renal diet Wound Care: Routine skin checks Functional status:  ___ No restrictions     ___ Walk up steps independently ___ 24/7 supervision/assistance   ___ Walk up steps with assistance ___ Intermittent supervision/assistance  ___ Bathe/dress independently ___ Walk with walker     __x_ Bathe/dress with assistance ___ Walk Independently    ___ Shower independently ___ Walk with assistance    ___ Shower with assistance ___ No alcohol     ___ Return to work/school ________  Special Instructions: No driving smoking or alcohol  Continue hemodialysis as directed  Follow-up endocrinology Dr. Holley Raring for outpatient work-up parathyroidectomy   My questions have been answered and I understand these instructions. I will adhere to these goals and the provided educational materials after my discharge from the hospital.  Patient/Caregiver Signature _______________________________ Date __________  Clinician Signature _______________________________________ Date __________  Please bring this form and your medication list with you to all your follow-up doctor's appointments.

## 2021-10-07 NOTE — Progress Notes (Signed)
PROGRESS NOTE   Subjective/Complaints: Anxious regarding therapy today Pain in hip and knee Constipated Asks if she can use her Dexcom instead of finger sticks.   ROS: +hip pain  Objective:   No results found. Recent Labs    10/06/21 0445  WBC 6.3  HGB 7.7*  HCT 23.5*  PLT 188   Recent Labs    10/06/21 0445  NA 131*  K 5.1  CL 94*  CO2 24  GLUCOSE 172*  BUN 71*  CREATININE 9.02*  CALCIUM 9.1    Intake/Output Summary (Last 24 hours) at 10/07/2021 1221 Last data filed at 10/07/2021 0700 Gross per 24 hour  Intake 240 ml  Output --  Net 240 ml        Physical Exam: Vital Signs Blood pressure (!) 154/73, pulse 100, temperature 98.2 F (36.8 C), resp. rate 18, height 5\' 7"  (1.702 m), weight 84.3 kg, SpO2 98 %. Gen: no distress, normal appearing HEENT: oral mucosa pink and moist, NCAT Cardio: Reg rate Chest: normal effort, normal rate of breathing Abd: soft, non-distended Ext: no edema Musculoskeletal:        General: Swelling and tenderness present.     Cervical back: Normal range of motion and neck supple.     Right lower leg: Edema present.     Left lower leg: Edema present.     Comments: Left hip tender to palpation and PROM with associated swelling. Left knee with mild effusion, patella laxity. Right hip tender with leg raise  Skin:    Comments: Incision site dressing clean dry and intact.   Neurological:     Comments: Patient is alert.  No acute distress.  Oriented x3 and follows commands. Alert and oriented x 3. Normal insight and awareness. Intact Memory. Normal language and speech. Cranial nerve exam unremarkable. UE motor 5/5. LE motor limited proximally by pain. ADF/PF 4-5/5. No sensory findings.   Psychiatric:     Comments: Pleasant, slightly anxious    Assessment/Plan: 1. Functional deficits which require 3+ hours per day of interdisciplinary therapy in a comprehensive inpatient rehab  setting. Physiatrist is providing close team supervision and 24 hour management of active medical problems listed below. Physiatrist and rehab team continue to assess barriers to discharge/monitor patient progress toward functional and medical goals  Care Tool:  Bathing              Bathing assist       Upper Body Dressing/Undressing Upper body dressing        Upper body assist      Lower Body Dressing/Undressing Lower body dressing            Lower body assist       Toileting Toileting    Toileting assist Assist for toileting: 2 Helpers     Transfers Chair/bed transfer  Transfers assist     Chair/bed transfer assist level: Dependent - mechanical lift     Locomotion Ambulation   Ambulation assist   Ambulation activity did not occur: Safety/medical concerns (weakness, fatigue, pain, anxiety)          Walk 10 feet activity   Assist  Walk 10 feet activity did not  occur: Safety/medical concerns (weakness, fatigue, pain, anxiety)        Walk 50 feet activity   Assist Walk 50 feet with 2 turns activity did not occur: Safety/medical concerns (weakness, fatigue, pain, anxiety)         Walk 150 feet activity   Assist Walk 150 feet activity did not occur: Safety/medical concerns (weakness, fatigue, pain, anxiety)         Walk 10 feet on uneven surface  activity   Assist Walk 10 feet on uneven surfaces activity did not occur: Safety/medical concerns (weakness, fatigue, pain, anxiety)         Wheelchair     Assist Is the patient using a wheelchair?: Yes Type of Wheelchair: Manual Wheelchair activity did not occur: Safety/medical concerns (weakness, fatigue, pain, anxiety)         Wheelchair 50 feet with 2 turns activity    Assist    Wheelchair 50 feet with 2 turns activity did not occur: Safety/medical concerns (weakness, fatigue, pain, anxiety)       Wheelchair 150 feet activity     Assist  Wheelchair  150 feet activity did not occur: Safety/medical concerns (weakness, fatigue, pain, anxiety)       Blood pressure (!) 154/73, pulse 100, temperature 98.2 F (36.8 C), resp. rate 18, height 5\' 7"  (1.702 m), weight 84.3 kg, SpO2 98 %.    Medical Problem List and Plan: 1. Functional deficits secondary to nondisplaced fracture right inferior pubic ramus as well as a displaced foreshortened fracture through the left femoral neck/intertrochanteric area.  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   -Weightbearing as tolerated with anterior total hip precautions.                                  -Conservative care right inferior pubic ramus fracture, weightbearing as tolerated             -patient may  shower             -ELOS/Goals: 10-14 days, supervision to min assist goals  -Initial CIR evals today  -Messaged April to schedule hospital follow-up 2.  Antithrombotics: -DVT/anticoagulation:  Mechanical: Antiembolism stockings, thigh (TED hose) Bilateral lower extremities check vascular study.   -should be able to begin lovenox today as permacath placed            -antiplatelet therapy: N/A 3. Pain Management: Flector patch,Robaxin 750 mg 4 times daily, hydrocodone as needed             -pt having significant spasms in left thigh.             -add kpad to help with spasms  -schedule tylenol 1000mg  TID 4. Mood: Provide emotional support.  Melatonin as needed             -antipsychotic agents: N/A 5. Neuropsych: This patient is capable of making decisions on her own behalf. 6. Skin/Wound Care: Routine skin checks 7. Fluids/Electrolytes/Nutrition: Routine in and outs with follow-up chemistries 8.  End-stage renal disease/hemodialysis.  Permacath exchanged 10/03/2021.    -  Follow-up hemodialysis per Dr. [name] -HD later in day to allow participation in therapies during the day 9.  Acute on chronic anemia.  Has had transfusions already x 2. Hgb has stabilized in 7 range..  Continue iron  supplement.              -epo per nephrology 10.  Diabetes mellitus  with peripheral neuropathy. Placed order that she may use her Dexcom 6. Hemoglobin A1c 6.2.  SSI as prior to admission. 11.  Hypertension.  Cozaar 50 mg daily, 12.  Hyperparathyroidism of renal origin.  Plan outpatient parathyroidectomy per Dr.Lateef 13.  Obesity.  BMI 27.76.  Dietary follow-up 14. Cushingoid?--outpt work up 27. Constipation: add senna 2 tabs HS  LOS: 1 days A FACE TO FACE EVALUATION WAS PERFORMED  Martha Clan P Cyndie Woodbeck 10/07/2021, 12:21 PM

## 2021-10-07 NOTE — Progress Notes (Signed)
Orthopedic Tech Progress Note Patient Details:  [name] [birth year] [id number]  Ortho Devices Type of Ortho Device: Knee Sleeve Ortho Device/Splint Location: RLE Ortho Device/Splint Interventions: Ordered, Application, Adjustment   Post Interventions Patient Tolerated: Well Instructions Provided: Care of [place] 10/07/2021, 4:27 PM

## 2021-10-07 NOTE — Consult Note (Signed)
Renal Service Consult Note [facility]  Torie Towle 10/07/2021 Sol Blazing, MD Requesting Physician: Dr. Ranell Patrick  Reason for Consult: ESRD pt sp hip fracture HPI: The patient is a 56 y.o. year-old w/ hx of anemia, ESRD on HD mwf, DM2, HTN, hx of CVA who had a fall at home w/ L hip pain and presented to [facility] on 09/28/21. Xrays showed L hip fracture and R inf pubic ramus fx. She had L hip hemiarthroplasty on 09/29/21 by Dr Harlow Mares. Her TDC was exchanged by Dr Ronalee Belts for malfunction. The pt was transferred to [facility] for rehab stay. We are asked to see for ESRD.    Pt seen in room. On HD x 4 yrs w/ [facility] f/b Dr Holley Raring. No AVF, using TDC's.  MWF HD.  No SOB or cough or CP at [facility] time   ROS - denies CP, no joint pain, no HA, no blurry vision, no rash, no diarrhea, no nausea/ vomiting   Past Medical History  Past Medical History:  Diagnosis Date   Anemia    vitamin d3 deficiency   Anxiety    Chronic kidney disease    End Stage Renal Disease   Diabetes mellitus without complication (HCC)    GERD (gastroesophageal reflux disease)    nothing over last few years   High serum parathyroid hormone (PTH)    checked through Dialysis   History of kidney stones 2000   Hypertension    Neuromuscular disorder (HCC)    neuropathy in feet   PONV (postoperative nausea and vomiting)    severe nausea requiring many doses of post op antiemetics   Stroke ([place]) 10/2017   thinks she had a series of mini strokes.right leg up to right side of face were numb. no loss of consciousness   Past Surgical History  Past Surgical History:  Procedure Laterality Date   ABDOMINAL HYSTERECTOMY  2007   AV FISTULA INSERTION W/ RF MAGNETIC GUIDANCE N/A 12/08/2017   Procedure: AV FISTULA INSERTION W/RF MAGNETIC GUIDANCE;  Surgeon: Katha Cabal, MD;  Location: [place] [facility];  Service: Cardiovascular;  Laterality: N/A;   DIALYSIS/PERMA CATHETER INSERTION Right 10/03/2021   Procedure:  DIALYSIS/PERMA CATHETER INSERTION;  Surgeon: Katha Cabal, MD;  Location: [place] [facility];  Service: Cardiovascular;  Laterality: Right;   HIP ARTHROPLASTY Left 09/29/2021   Procedure: ARTHROPLASTY BIPOLAR HIP (HEMIARTHROPLASTY);  Surgeon: Lovell Sheehan, MD;  Location: [facility] OR[facility];  Service: Orthopedics;  Laterality: Left;   ROTATOR CUFF REPAIR Right 2004   SHOULDER CLOSED REDUCTION Right 2004   UPPER EXTREMITY VENOGRAPHY Left 02/15/2018   Procedure: UPPER EXTREMITY VENOGRAPHY;  Surgeon: Katha Cabal, MD;  Location: [place] [facility];  Service: Cardiovascular;  Laterality: Left;   Family History  Family History  Problem Relation Age of Onset   Emphysema Mother    COPD Mother    Cancer Father    Cancer Paternal Aunt    Diabetes Sister    Hypertension Sister    Eczema Sister    Diabetes Brother    Hypertension Brother    Cardiomyopathy Brother    Alcohol abuse Brother    Social History  reports that she has never smoked. She has never used smokeless tobacco. She reports that she does not drink alcohol and does not use drugs. Allergies  Allergies  Allergen Reactions   Enalapril Hives and Other (See Comments)    Angioedema face.   Ivp Dye [Iodinated Contrast Media] Hives  Lisinopril Shortness Of Breath   Shellfish Allergy Swelling and Other (See Comments)    patient tolerates shellfish by mouth without problem   Home medications Prior to Admission medications   Medication Sig Start Date End Date Taking? Authorizing Provider  AURYXIA 1 GM 210 MG(Fe) tablet TAKE 2 TABLETS BY MOUTH WITH MEAL AND 1 TABLET WITH [facility] 10/14/18   [provider]  blood glucose meter kit and supplies Dispense based on patient and insurance preference. Use up to four times daily as directed. (FOR ICD-10 E10.9, E11.9). 06/09/18   Steele Sizer, MD  Diclofenac Epolamine (LICART) 1.3 % UE45 Place 1 patch onto the skin in the morning and at bedtime. 10/06/21   Nita Sells, MD   furosemide (LASIX) 10 MG/ML injection Inject 2 mLs (20 mg total) into the vein once for 1 dose. 10/06/21 10/06/21  Nita Sells, MD  glucagon (GLUCAGON EMERGENCY) 1 MG injection Inject 1 mg into the vein once as needed for up to 1 dose. 01/03/19   Steele Sizer, MD  glucose blood test strip One Touch Ultra, check fsbs 5 times daily 09/27/18   Steele Sizer, MD  HYDROcodone-acetaminophen (NORCO/VICODIN) 5-325 MG tablet Take 1 tablet by mouth every 4 (four) hours as needed for moderate pain. 10/01/21   Carlynn Spry, PA-C  Lancets [facility] ULTRASOFT) lancets Use as instructed 06/09/18   Steele Sizer, MD  losartan (COZAAR) 50 MG tablet Take 50 mg by mouth daily.  08/26/18   Lateef, Munso[place], MD  melatonin 5 MG TABS Take 1 tablet (5 mg total) by mouth at bedtime as needed. 10/06/21   Nita Sells, MD  methocarbamol (ROBAXIN) 750 MG tablet Take 1 tablet (750 mg total) by mouth 4 (four) times daily. 10/06/21   Nita Sells, MD  multivitamin (RENA-VIT) TABS tablet Take 1 tablet by mouth at bedtime. 10/06/21   Nita Sells, MD  NON FORMULARY as needed (oils used via inhaling and diffuser).     [provider]  NOVOFINE 32G X 6 MM MISC 1 EACH BY DOES NOT APPLY ROUTE 3 (THREE) TIMES DAILY WITH MEALS. 04/17/19   Sowles, Drue Stager, MD  NOVOLOG FLEXPEN 100 UNIT/ML FlexPen INJECT 10-22 UNITS INTO THE SKIN 3 (THREE) TIMES DAILY WITH MEALS. 04/17/19   Steele Sizer, MD  Nutritional Supplements (FEEDING SUPPLEMENT, NEPRO CARB STEADY,) LIQD Take 237 mLs by mouth 2 (two) times daily between meals. 10/06/21   Nita Sells, MD     Vitals:   10/06/21 1511 10/06/21 2025 10/07/21 1449 10/07/21 1948  BP: (!) 152/79 (!) 154/73 (!) 157/68 (!) 141/62  Pulse: 98 100 (!) 101 99  Resp: _0 Temp: 98.1 F (36.7 C) 98.2 F (36.8 C) (!) 97.5 F (36.4 C) 98.3 F (36.8 C)  TempSrc: Oral   Oral  SpO2: 95% 98% 100% 100%  Weight:      Height:       Exam Gen alert, no  distress No rash, cyanosis or gangrene Sclera anicteric, throat clear  No jvd or bruits Chest clear bilat to bases, no rales/ wheezing RRR no MRG Abd soft ntnd no mass or ascites +bs GU normal MS no joint effusions or deformity Ext mild LLE edema, L hip incision dressed, no wounds or ulcers Neuro is alert, Ox 3 , nf  RIJ TDC in place    Home meds include - auryxia 2 ac, norco prn, losartan 50 qd, rena-vit qd, insulin novolog, nepro bid, prns     OP HD: MWF [facility], get  records in am  3 hrs per the pt   Assessment/ Plan: Debility - CIR started today L hip fracture/ R pelvic ramus fx - after fall, sp L hemiarthroplasty on 09/29/21 at [facility] ESRD - on HD mwf. Last HD Monday. Hd tomorrow BP/ volume - mild edema on exam, cont losartan. UF 2-3 L w/ HD tomorrow.  DM2 - per pmd Anemia ckd - get records, Hb 7- 8 range MBD ckd - get records, cont auryxia, Ca and phos in range      [employer]  MD 10/07/2021, 8:27 PM  Recent Labs  Lab 10/04/21 0447 10/06/21 0445  WBC 4.8 6.3  HGB 7.8* 7.7*   Recent Labs  Lab 10/04/21 0447 10/06/21 0445  K 4.5 5.1  BUN 46* 71*  CREATININE 5.82* 9.02*  ALBUMIN 2.8* 2.9*  CALCIUM 9.1 9.1  PHOS 5.5* 5.9*

## 2021-10-07 NOTE — Progress Notes (Signed)
Occupational Therapy Session Note  Patient Details  Name: [name] MRN: [id number] Date of Birth: [birth year]  Today's Date: 10/07/2021 OT Individual Time: 1305-1400 OT Individual Time Calculation (min): 55 min    Short Term Goals: Week 1:  OT Short Term Goal 1 (Week 1): Pt will stand with max A of 1 in the stedy OT Short Term Goal 2 (Week 1): Pt will improve pain management <8/10 during ADLs OT Short Term Goal 3 (Week 1): Pt will complete LB dressing with max A of 1  Skilled Therapeutic Interventions/Progress Updates:  Skilled OT intervention completed with focus on self-care, activity tolerance, pain management, and pt/family education regarding OT purpose and goals. Pt received upright in bed, with [name] in room, with therapist actively engaging in emotional support for pt as pt very tearful with earlier therapy session, very anxious and with lots of questions about the rehab process. Pt declining OOB activities, reporting that her "knee dislocated earlier and this morning has just been traumatic." Therapist encouraged pt to practice bed mobility to increase her efficiency/comfort with such, however pt strongly declining. Pt agreeable to bed level self-care, with pt able to wash UB with set up A, LB with min A for reaching both feet only. Pt completed oral care and grooming, with set up A as well. Pt completed bilateral ankle dorsi/plantar flexion x10 for edema management, as well as LLE heel slides and hip abduction x5 each with very minimal tolerance despite therapist supporting posterior knee and heel to guide LE during exercises. Pt only able to tolerate MAX of <10 degrees knee flexion, and that was with max encouragement and breathing techniques provided. Education provided to pt about maintaining mobility in the legs at her WBAT status to prevent legs from stiffening and impairing functional transfers. Provided pillows for pt's comfort, with pt left upright in bed, bed alarm on and all needs  in reach at end of session.   Therapy Documentation Precautions:  Restrictions Weight Bearing Restrictions: Yes RLE Weight Bearing: Weight bearing as tolerated LLE Weight Bearing: Weight bearing as tolerated  Pain: Unrated pain in bilateral knees, pre-medicated after checking in with nurse, repositioned to make her comfortable   Therapy/Group: Individual Therapy  Carley Strickling E Giovanna Kemmerer 10/07/2021, 7:53 AM

## 2021-10-08 ENCOUNTER — Encounter: Payer: Self-pay | Admitting: Orthopedic Surgery

## 2021-10-08 DIAGNOSIS — N186 End stage renal disease: Secondary | ICD-10-CM | POA: Diagnosis not present

## 2021-10-08 DIAGNOSIS — S79002S Unspecified physeal fracture of upper end of left femur, sequela: Secondary | ICD-10-CM | POA: Diagnosis not present

## 2021-10-08 DIAGNOSIS — E1169 Type 2 diabetes mellitus with other specified complication: Secondary | ICD-10-CM | POA: Diagnosis not present

## 2021-10-08 LAB — GLUCOSE, CAPILLARY
Glucose-Capillary: 174 mg/dL — ABNORMAL HIGH (ref 70–99)
Glucose-Capillary: 198 mg/dL — ABNORMAL HIGH (ref 70–99)
Glucose-Capillary: 172 mg/dL — ABNORMAL HIGH (ref 70–99)

## 2021-10-08 LAB — HEPATITIS B SURFACE ANTIGEN: Hepatitis B Surface Ag: NONREACTIVE

## 2021-10-08 LAB — HEPATITIS B SURFACE ANTIBODY,QUALITATIVE: Hep B S Ab: NONREACTIVE

## 2021-10-08 MED ORDER — HEPARIN SODIUM (PORCINE) 1000 UNIT/ML IJ SOLN
INTRAMUSCULAR | Status: AC
  Filled 2021-10-08: qty 3

## 2021-10-08 MED ORDER — DICLOFENAC SODIUM 1 % EX GEL
2.0000 g | Freq: Four times a day (QID) | CUTANEOUS | Status: DC
  Administered 2021-10-09 – 2021-10-15 (×12): 2 g via TOPICAL
  Filled 2021-10-08: qty 100

## 2021-10-08 MED FILL — Hydromorphone HCl Inj 1 MG/ML: INTRAMUSCULAR | Qty: 1 | Status: AC

## 2021-10-08 NOTE — Progress Notes (Signed)
Physical Therapy Session Note  Patient Details  Name: [name] MRN: [id number] Date of Birth: 1966/03/15  Today's Date: 10/08/2021 PT Individual Time: 0370-4888 PT Individual Time Calculation (min): 55 min   Short Term Goals: Week 1:  PT Short Term Goal 1 (Week 1): Pt will transfer sit<>stand with LRAD and max A of 1 PT Short Term Goal 2 (Week 1): Pt will transfer bed<>chair with LRAD and max A of 1 PT Short Term Goal 3 (Week 1): Pt will initate gait training  Skilled Therapeutic Interventions/Progress Updates:   Received pt semi-reclined in bed with knee sleeves on and ice pack on R knee. Pt agreeable to PT treatment but reported pain 8/10 in R knee and soreness in bilateral knees (premedicated). Per previous PT, pt adamant that she is not getting OOB today; pt reports being "traumatized" from getting up in the [facility] yesterday and only agreeable to bed level exercises today, requesting to "slow down" pace of therapy until she gains more confidence and strength in her legs. Educated pt on slideboard and demonstrated technique for using slideboard to transfer to take pressure off knees - pt stated she will attempt once her legs feel stronger. Pt performed the following exercises with supervision and verbal cues for technique with emphasis on UE/LE strength and ROM: -hip adduction pillow squeezes 2x10 -ankle pumps 2x20 bilaterally  -PF with yellow TB 2x20 bilaterally -DF with yellow TB 2x20 bilaterally -bicep curls with yellow TB 2x20 bilaterally  -hip abduction with yellow TB 2x10 -AAROM SAQ 2x10 bilaterally - pt only able to tolerate ~20 degrees knee flexion on L knee and ~30 degrees on R knee Pt required multiple rest breaks throughout exercises and limited by pain and anxiety with movement throughout session. Concluded session with pt semi-reclined in bed, needs within reach, and bed alarm on.   Therapy Documentation Precautions:  Precautions Precautions: Fall Precaution Comments: L  AVF, R IJ perm-cath ( will need new dialysis cath) Restrictions Weight Bearing Restrictions: Yes RLE Weight Bearing: Weight bearing as tolerated LLE Weight Bearing: Weight bearing as tolerated  Therapy/Group: Individual Therapy Alfonse Alpers PT, DPT   10/08/2021, 7:34 AM

## 2021-10-08 NOTE — Progress Notes (Signed)
PROGRESS NOTE   Subjective/Complaints: Continues to be very anxious Right knee XR results discussed with patient- will order MRI for better visualization of soft tissue given her significant pain Team conference held today  ROS: +hip pain, +right knee pain  Objective:   DG Knee Right Port  Result Date: 10/07/2021 CLINICAL DATA:  Trauma, fall, pain EXAM: PORTABLE RIGHT KNEE - 1-2 VIEW COMPARISON:  None. FINDINGS: No recent fracture or dislocation is seen. There is small to moderate effusion in the suprapatellar bursa. Erosive change seen in the posterior margin of patella may be due to degenerative arthritis. There are faint calcifications in the quadriceps tendon close to patella, possibly calcific tendinosis or bursitis. Osteopenia is seen in bony structures. In the AP view, there is 4.2 x 3.1 cm area of subtle decreased density with possible disruption of trabeculae in the proximal shaft of tibia. There is no break in the cortical margins. IMPRESSION: No recent fracture or dislocation is seen. Small to moderate effusion is present in the suprapatellar bursa. There is ill-defined 4.2 x 3.1 cm area of decreased density with possible disruption of trabeculae in the central portion of proximal shaft of tibia. This may be due to osteopenia or suggest a space-occupying lesion. Comparison with previous studies or follow-up CT or MRI may be considered. Electronically Signed   By: Elmer Picker M.D.   On: 10/07/2021 15:20   Recent Labs    10/06/21 0445  WBC 6.3  HGB 7.7*  HCT 23.5*  PLT 188   Recent Labs    10/06/21 0445  NA 131*  K 5.1  CL 94*  CO2 24  GLUCOSE 172*  BUN 71*  CREATININE 9.02*  CALCIUM 9.1    Intake/Output Summary (Last 24 hours) at 10/08/2021 1134 Last data filed at 10/08/2021 1655 Gross per 24 hour  Intake 476 ml  Output --  Net 476 ml        Physical Exam: Vital Signs Blood pressure (!) 161/68, pulse  94, temperature 98.1 F (36.7 C), temperature source Oral, resp. rate 18, height 5\' 7"  (1.702 m), weight 84.3 kg, SpO2 99 %. Gen: no distress, normal appearing, BMI 29.11 HEENT: oral mucosa pink and moist, NCAT Cardio: Reg rate Chest: normal effort, normal rate of breathing Abd: soft, non-distended Ext: no edema Musculoskeletal:        General: Swelling and tenderness present.     Cervical back: Normal range of motion and neck supple.     Right lower leg: Edema present.     Left lower leg: Edema present.     Comments: Left hip tender to palpation and PROM with associated swelling. Left knee with mild effusion, patella laxity. Right hip tender with leg raise  Skin:    Comments: Incision site dressing clean dry and intact.   Neurological:     Comments: Patient is alert.  No acute distress.  Oriented x3 and follows commands. Alert and oriented x 3. Normal insight and awareness. Intact Memory. Normal language and speech. Cranial nerve exam unremarkable. UE motor 5/5. LE motor limited proximally by pain. ADF/PF 4-5/5. No sensory findings.   Psychiatric:     Comments: Pleasant, slightly anxious  Assessment/Plan: 1. Functional deficits which require 3+ hours per day of interdisciplinary therapy in a comprehensive inpatient rehab setting. Physiatrist is providing close team supervision and 24 hour management of active medical problems listed below. Physiatrist and rehab team continue to assess barriers to discharge/monitor patient progress toward functional and medical goals  Care Tool:  Bathing  Bathing activity did not occur: Safety/medical concerns Body parts bathed by patient: Right arm, Left arm, Chest, Abdomen, Right upper leg, Left upper leg         Bathing assist Assist Level: Set up assist     Upper Body Dressing/Undressing Upper body dressing   What is the patient wearing?: Pull over shirt    Upper body assist Assist Level: Supervision/Verbal cueing    Lower Body  Dressing/Undressing Lower body dressing      What is the patient wearing?: Pants     Lower body assist Assist for lower body dressing: 2 Helpers     Toileting Toileting    Toileting assist Assist for toileting: 2 Helpers     Transfers Chair/bed transfer  Transfers assist     Chair/bed transfer assist level: 2 Helpers     Locomotion Ambulation   Ambulation assist   Ambulation activity did not occur: Safety/medical concerns (weakness, fatigue, pain, anxiety)          Walk 10 feet activity   Assist  Walk 10 feet activity did not occur: Safety/medical concerns (weakness, fatigue, pain, anxiety)        Walk 50 feet activity   Assist Walk 50 feet with 2 turns activity did not occur: Safety/medical concerns (weakness, fatigue, pain, anxiety)         Walk 150 feet activity   Assist Walk 150 feet activity did not occur: Safety/medical concerns (weakness, fatigue, pain, anxiety)         Walk 10 feet on uneven surface  activity   Assist Walk 10 feet on uneven surfaces activity did not occur: Safety/medical concerns (weakness, fatigue, pain, anxiety)         Wheelchair     Assist Is the patient using a wheelchair?: Yes Type of Wheelchair: Manual Wheelchair activity did not occur: Safety/medical concerns (weakness, fatigue, pain, anxiety)         Wheelchair 50 feet with 2 turns activity    Assist    Wheelchair 50 feet with 2 turns activity did not occur: Safety/medical concerns (weakness, fatigue, pain, anxiety)       Wheelchair 150 feet activity     Assist  Wheelchair 150 feet activity did not occur: Safety/medical concerns (weakness, fatigue, pain, anxiety)       Blood pressure (!) 161/68, pulse 94, temperature 98.1 F (36.7 C), temperature source Oral, resp. rate 18, height 5\' 7"  (1.702 m), weight 84.3 kg, SpO2 99 %.    Medical Problem List and Plan: 1. Functional deficits secondary to nondisplaced fracture right  inferior pubic ramus as well as a displaced foreshortened fracture through the left femoral neck/intertrochanteric area.  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   -Weightbearing as tolerated with anterior total hip precautions.                                  -Conservative care right inferior pubic ramus fracture, weightbearing as tolerated             -patient may  shower             -  ELOS/Goals: 10-14 days, supervision to min assist goals  --Interdisciplinary Team Conference today    -HFU scheduled 2.  Antithrombotics: -DVT/anticoagulation:  Mechanical: Antiembolism stockings, thigh (TED hose) Bilateral lower extremities check vascular study.   -should be able to begin lovenox today as permacath placed            -antiplatelet therapy: N/A 3. Femur fracture pain: continue Flector patch,Robaxin 750 mg 4 times daily, hydrocodone as needed             -pt having significant spasms in left thigh.             -add kpad to help with spasms  -schedule tylenol 1000mg  TID 4. Mood: Provide emotional support.  Melatonin as needed             -antipsychotic agents: N/A 5. Neuropsych: This patient is capable of making decisions on her own behalf. 6. Skin/Wound Care: Routine skin checks 7. Fluids/Electrolytes/Nutrition: Routine in and outs with follow-up chemistries 8.  End-stage renal disease/hemodialysis.  Permacath exchanged 10/03/2021.    -  Follow-up hemodialysis per Dr. [name] -HD later in day to allow participation in therapies during the day 9.  Acute on chronic anemia.  Has had transfusions already x 2. Hgb has stabilized in 7 range..  Continue iron supplement.              -epo per nephrology 10.  Diabetes mellitus with peripheral neuropathy. Placed order that she may use her Dexcom 6. Hemoglobin A1c 6.2.  SSI as prior to admission. Discuss Qutenza.  11.  Hypertension.  ContinueCozaar 50 mg daily, 12.  Hyperparathyroidism of renal origin.  Plan outpatient parathyroidectomy per  Dr.Lateef 13.  Overweight.  BMI 29.11.  Dietary follow-up 14. Cushingoid?--outpt work up 5. Constipation: add senna 2 tabs HS, continue this regimen.  16. Right knee effusion: order MRI.   LOS: 2 days A FACE TO FACE EVALUATION WAS PERFORMED  Clide Deutscher [place] 10/08/2021, 11:34 AM

## 2021-10-08 NOTE — Progress Notes (Signed)
Occupational Therapy Session Note  Patient Details  Name: [name] MRN: [id number] Date of Birth: 24-Oct-1965  Today's Date: 10/08/2021 OT Individual Time: 1003-1057 OT Individual Time Calculation (min): 54 min    Short Term Goals: Week 1:  OT Short Term Goal 1 (Week 1): Pt will stand with max A of 1 in the stedy OT Short Term Goal 2 (Week 1): Pt will improve pain management <8/10 during ADLs OT Short Term Goal 3 (Week 1): Pt will complete LB dressing with max A of 1  Skilled Therapeutic Interventions/Progress Updates:  Skilled OT intervention completed with focus on activity tolerance, emotional support for pt, functional bed mobility, LB exercises for edema management and stiffness. Pt received upright in bed, very hesitant with therapist entry, stating that she refuses to get OOB. Extensive conversation and education provided to pt about importance of rehab goals, the healing process, and taking baby steps to gain confidence with movements.   With MAX encouragement and motivation, pt agreeable to work on bringing legs to EOB. Pt able to use BUE to support her trunk with supervision, to slowly slide/rotate hips while therapist alternated support underneath knee/ankle on BLEs while slowly advancing to the EOB. Bed mobility overall at Mod-Max A. Pt required increased time due to pain, breathing technique education, and for coping with the movement despite her fear.   Once EOB, pt able to tolerate about 20 mins of sitting EOB with intermittent BUE support on bed with supervision for dynamic sitting balance. To promote edema management, pt completed 2x10 knee extension on LLE, followed by 2x10 heel raises on BLEs. Pt benefited from visual target for motivation and distraction from pain. Discussed strategies with pt about how to manage anxiety and fears with therapist due to fear of extreme movement. Pt required +1 at Max A level for getting back into bed, with LLE in more pain upon getting into  bed vs getting out. Pt requested to take off shorts, with Max A required to doff them, however +1 assist needed for getting pt to clear bottom from bed as pt needed pillow/positioning of LLE support when laterally leaning towards R side to remove lightly soiled chuck pad. Pt left seated upright in bed, with bed alarm on and all needs in reach at end of session.   Therapy Documentation Precautions:  Precautions Precautions: Fall Precaution Comments: L AVF, R IJ perm-cath ( will need new dialysis cath) Restrictions Weight Bearing Restrictions: Yes RLE Weight Bearing: Weight bearing as tolerated LLE Weight Bearing: Weight bearing as tolerated  Pain: Unrated pain with bed mobility however improved with breathing techniques and off loading the weight at bilateral knees   Therapy/Group: Individual Therapy  [name] E Carter Kaman 10/08/2021, 7:46 AM

## 2021-10-08 NOTE — Progress Notes (Signed)
[facility] Progress Note  Subjective: seen in room, no c/o  Vitals:   10/06/21 2025 10/07/21 1449 10/07/21 1948 10/08/21 0406  BP: (!) 154/73 (!) 157/68 (!) 141/62 (!) 161/68  Pulse: 100 (!) 101 99 94  Resp: 18 18 16 18   Temp: 98.2 F (36.8 C) (!) 97.5 F (36.4 C) 98.3 F (36.8 C) 98.1 F (36.7 C)  TempSrc:   Oral Oral  SpO2: 98% 100% 100% 99%  Weight:      Height:        Exam: Gen alert, no distress Sclera anicteric, throat clear  No jvd or bruits Chest clear bilat to bases RRR no MRG Abd soft ntnd no mass or ascites +bs Ext bilat 1+ LE edema, L hip incision dressed Neuro is alert, Ox 3 , nf  RIJ TDC in place      Home meds include - auryxia 2 ac, norco prn, losartan 50 qd, rena-vit qd, insulin novolog, nepro bid, prns        OP HD: MWF [facility] [address] [phone])  3h 19min  81kg   Hep 3000 then 800u/hr  RIJ TDC       Assessment/ Plan: Debility - rehab per CIR L hip fracture/ R pelvic ramus fx - sp L hemiarthroplasty on 09/29/21 at [facility] ESRD - on HD mwf. Last HD Monday at [facility]. HD today. BP/ volume - mild edema on exam, cont losartan. UF 2-3 L w/ HD tomorrow.  DM2 - per pmd Anemia ckd - Hb 7- 8 range MBD ckd - cont auryxia, Ca and phos in range     Rob Tiani Stanbery 10/08/2021, 1:14 PM   Recent Labs  Lab 10/04/21 0447 10/06/21 0445  K 4.5 5.1  BUN 46* 71*  CREATININE 5.82* 9.02*  ALBUMIN 2.8* 2.9*  CALCIUM 9.1 9.1  PHOS 5.5* 5.9*  HGB 7.8* 7.7*   Inpatient medications:  acetaminophen  1,000 mg Oral TID   vitamin C  500 mg Oral Daily   Chlorhexidine Gluconate Cloth  6 each Topical BID   Chlorhexidine Gluconate Cloth  6 each Topical Q0600   diclofenac  1 patch Transdermal BID   diclofenac Sodium  2 g Topical QID   docusate sodium  100 mg Oral BID   enoxaparin (LOVENOX) injection  30 mg Subcutaneous Q24H   feeding supplement (NEPRO CARB STEADY)  237 mL Oral BID BM   ferric citrate  420 mg Oral TID WC   And   ferric citrate   210 mg Oral With snacks   insulin aspart  0-6 Units Subcutaneous TID WC   losartan  50 mg Oral Daily   magnesium oxide  200 mg Oral BID   methocarbamol  750 mg Oral QID   multivitamin  1 tablet Oral QHS   senna  2 tablet Oral QHS   [START ON 10/09/2021] Vitamin D (Ergocalciferol)  50,000 Units Oral Q7 days    alum & mag hydroxide-simeth, bisacodyl, heparin, HYDROcodone-acetaminophen, melatonin, ondansetron **OR** ondansetron (ZOFRAN) IV, oxyCODONE, polyethylene glycol

## 2021-10-08 NOTE — Progress Notes (Signed)
Inpatient Rehabilitation Care Coordinator Assessment and Plan Patient Details  Name: Newell Frater MRN: [id number] Date of Birth: 1965-09-14  Today's Date: 10/08/2021  Hospital Problems: Principal Problem:   Femur fracture [facility])  Past Medical History:  Past Medical History:  Diagnosis Date   Anemia    vitamin d3 deficiency   Anxiety    Chronic kidney disease    End Stage Renal Disease   Diabetes mellitus without complication (HCC)    GERD (gastroesophageal reflux disease)    nothing over last few years   High serum parathyroid hormone (PTH)    checked through Dialysis   History of kidney stones 2000   Hypertension    Neuromuscular disorder ([place])    neuropathy in feet   PONV (postoperative nausea and vomiting)    severe nausea requiring many doses of post op antiemetics   Stroke ([place]) 10/2017   thinks she had a series of mini strokes.right leg up to right side of face were numb. no loss of consciousness   Past Surgical History:  Past Surgical History:  Procedure Laterality Date   ABDOMINAL HYSTERECTOMY  2007   AV FISTULA INSERTION W/ RF MAGNETIC GUIDANCE N/A 12/08/2017   Procedure: AV FISTULA INSERTION W/RF MAGNETIC GUIDANCE;  Surgeon: Katha Cabal, MD;  Location: [place] CV LAB;  Service: Cardiovascular;  Laterality: N/A;   DIALYSIS/PERMA CATHETER INSERTION Right 10/03/2021   Procedure: DIALYSIS/PERMA CATHETER INSERTION;  Surgeon: Katha Cabal, MD;  Location: [place] CV LAB;  Service: Cardiovascular;  Laterality: Right;   HIP ARTHROPLASTY Left 09/29/2021   Procedure: ARTHROPLASTY BIPOLAR HIP (HEMIARTHROPLASTY);  Surgeon: Lovell Sheehan, MD;  Location: [facility] ORS;  Service: Orthopedics;  Laterality: Left;   ROTATOR CUFF REPAIR Right 2004   SHOULDER CLOSED REDUCTION Right 2004   UPPER EXTREMITY VENOGRAPHY Left 02/15/2018   Procedure: UPPER EXTREMITY VENOGRAPHY;  Surgeon: Katha Cabal, MD;  Location: [place] CV LAB;  Service: Cardiovascular;   Laterality: Left;   Social History:  reports that she has never smoked. She has never used smokeless tobacco. She reports that she does not drink alcohol and does not use drugs.  Family / Support Systems Marital Status: Married How Long?: 24 years Spouse/Significant Other: Aaron Edelman (husband) [phone] Children: no children Other Supports: none reported Anticipated Caregiver: Husband Ability/Limitations of Caregiver: Husband works as an Therapist, music for [employer] and is only avavilable intermittently. Patient was driving self to/from dialysis. Dialysis clinic is only 5 minutes from their home. Caregiver Availability: Intermittent Family Dynamics: Patient lives with her husband.  Social History Preferred language: English Religion: Christian Cultural Background: Pt has been on dialysis since Jan 2021 Education: high school grad Health Literacy - How often do you need to have someone help you when you read instructions, pamphlets, or other written material from your doctor or pharmacy?: Never Writes: Yes Employment Status: Disabled Date Retired/Disabled/Unemployed: Jan 2021 due to dialysis. Goes to [facility] MWF; seat time 245pm (3hrs). Legal History/Current Legal Issues: Denies Guardian/Conservator: N/A   Abuse/Neglect Abuse/Neglect Assessment Can Be Completed: Yes Physical Abuse: Denies Verbal Abuse: Denies Sexual Abuse: Denies Exploitation of patient/patient's resources: Denies Self-Neglect: Denies  Patient response to: Social Isolation - How often do you feel lonely or isolated from those around you?: Never  Emotional Status Pt's affect, behavior and adjustment status: Pt in pain at time of visit. Patient very tearful as she discussed her health ailments and concerned that she will not make any progress. Recent Psychosocial Issues: Denies Psychiatric History: Denies Substance Abuse  History: Denies  Patient / Family Perceptions, Expectations & Goals Pt/Family  understanding of illness & functional limitations: Pt and husband have a general understanding of pt care needs Premorbid pt/family roles/activities: Independent Anticipated changes in roles/activities/participation: Assistance from ADLs/IADLs Pt/family expectations/goals: Patient goal is "be able to sit to stand on own, and stand up to shower." Her husband's goal is for her to "almost be comparable to how she was before (with a rollator)."  [employer]: None Premorbid Home Care/DME Agencies: None Transportation available at discharge: Husband to transprot Is the patient able to respond to transportation needs?: Yes In the past 12 months, has lack of transportation kept you from medical appointments or from getting medications?: No In the past 12 months, has lack of transportation kept you from meetings, work, or from getting things needed for daily living?: No Resource referrals recommended: Neuropsychology  Discharge Planning Living Arrangements: Spouse/significant other Support Systems: Spouse/significant other Type of Residence: Private residence Insurance underwriter Resources: [employer], Multimedia programmer (specify) Nurse, mental health) Financial Resources: SSD Financial Screen Referred: No Living Expenses: Own Money Management: Spouse Does the patient have any problems obtaining your medications?: No Home Management: Patient spous manages all home care needs Patient/Family Preliminary Plans: NO changes Care Coordinator Barriers to Discharge: Hemodialysis, Decreased caregiver support, Lack of/limited family support Care Coordinator Barriers to Discharge Comments: Husband works. Patient drives self to/from dialysis. Plans for patient to transition to periotoneal dialysis when able too but still in home training. Care Coordinator Anticipated Follow Up Needs: HH/OP  Clinical Impression This SW covering for primary SW, Erlene Quan.   SW met with pt and pt husband in room  to introduce self, explain role, and discuss discharge process. Patient is not a English as a second language teacher. No HCPOA. DME: rollator, 2 scooters, and RW.  Pt and husband understand primary SW will f/u after team conference with updates.   Pennie Vanblarcom A Deziree Mokry 10/08/2021, 6:10 AM

## 2021-10-08 NOTE — Progress Notes (Signed)
Patient ID: [name], female   DOB: [birth year], 56 y.o.   MRN: [id number] Team Conference Report to Patient/Family  Team Conference discussion was reviewed with the patient and caregiver, including goals, any changes in plan of care and target discharge date.  Patient and caregiver express understanding and are in agreement.  The patient has a target discharge date of 10/29/21.  Sw met with patient and provided conference updates. Patient reports she has been waiting on medication. No additional questions or concerns.  [name] 10/08/2021, 1:58 PM

## 2021-10-08 NOTE — Progress Notes (Signed)
Physical Therapy Session Note  Patient Details  Name: [name] MRN: [id number] Date of Birth: [birth year]  Today's Date: 10/08/2021 PT Individual Time: 0071-2197 PT Individual Time Calculation (min): 44 min   Short Term Goals: Week 1:  PT Short Term Goal 1 (Week 1): Pt will transfer sit<>stand with LRAD and max A of 1 PT Short Term Goal 2 (Week 1): Pt will transfer bed<>chair with LRAD and max A of 1 PT Short Term Goal 3 (Week 1): Pt will initate gait training  Skilled Therapeutic Interventions/Progress Updates:     Retrieved 20x18 standard w/c from DME closet. Pt awake in bed, agreeable to PT tx but hesitant to participate due to R knee pain. Spent time educating her on benefits of mobility and exercises to promote healing and functional indep. Pt reports frustration with pace of therapy, requesting therapy to "slow down" as it's going to fast for her. Discussed PT goals, PT POC, aggressive rehab for recovery and functional indep, etc. Pt understanding but requesting to gain "more confidence" in developing leg strength prior to engaging in functional transfers or OOB mobility.    Completed the following bed level exercises: -1x20 ankle pumps bilaterally -1x20 quad sets, bilaterally -1x20 glut sets -2x5 hip abduction, bilaterally (AAROM, more limited on L>R) *Rest breaks b/w sets and cueing for muscle activation and sequencing throughout.  MD entering room to complete morning rounding - discussed pain management and tolerance for progressing therapy as able.   Ice pack provided at end of session for R knee discomfort. Bed lowered, bed alarm on, all immediate needs within reach.   Therapy Documentation Precautions:  Precautions Precautions: Fall Precaution Comments: L AVF, R IJ perm-cath ( will need new dialysis cath) Restrictions Weight Bearing Restrictions: Yes RLE Weight Bearing: Weight bearing as tolerated LLE Weight Bearing: Weight bearing as tolerated General:     Therapy/Group: Individual Therapy  Alger Simons 10/08/2021, 7:52 AM

## 2021-10-08 NOTE — Progress Notes (Signed)
[place] this am and spoke to [employer]. Clinic confirms that pt will return to in-center clinic HD at d/c from CIR. Pt will have home therapy training in the future. Pt's schedule is MWF. Pt needs to arrive at 10:30 for 10:45 chair time. Navigator will provide clinic with pt's d/c date from CIR once known. Message left for [name] for CIR [phone]) to provide update regarding above info. Will assist as needed.   Melven Sartorius Renal Navigator [phone]

## 2021-10-08 NOTE — Progress Notes (Signed)
Physical Therapy Session Note  Patient Details  Name: [name] MRN: [id number] Date of Birth: October 08, 1965  Today's Date: 10/08/2021 PT Individual Time: 1115-1130 PT Individual Time Calculation (min): 15 min   Short Term Goals: Week 1:  PT Short Term Goal 1 (Week 1): Pt will transfer sit<>stand with LRAD and max A of 1 PT Short Term Goal 2 (Week 1): Pt will transfer bed<>chair with LRAD and max A of 1 PT Short Term Goal 3 (Week 1): Pt will initate gait training  Skilled Therapeutic Interventions/Progress Updates:     Pt received semi-reclined in bed, propped on pillows. Reports significant pain in L hip and groin and R knee. Requesting to rest at this time due to pain and traumatic experience with R knee yesterday. PT has discussion with pt, educating on importance of OOB mobility, and limited benefits of performing ther-ex in bed. PT encourages pt to attempt slide board transfer and pt is adamantly opposed attempting this date. Pt states that she has been through too much and needs to "move at my own pace." PT educates on pain perception and benefits of movement. Pt verbalizes understanding but insists on resting secondary to pain and weakness. Pt misses 15 minutes of skilled PT secondary to refusal to participate.   Therapy Documentation Precautions:  Precautions Precautions: Fall Precaution Comments: L AVF, R IJ perm-cath ( will need new dialysis cath) Restrictions Weight Bearing Restrictions: Yes RLE Weight Bearing: Weight bearing as tolerated LLE Weight Bearing: Weight bearing as tolerated    Therapy/Group: Individual Therapy  Breck Coons, PT, DPT 10/08/2021, 11:55 AM

## 2021-10-08 NOTE — Patient Care Conference (Signed)
Inpatient RehabilitationTeam Conference and Plan of Care Update Date: 10/08/2021   Time: 11:29 AM    Patient Name: [name]      Medical Record Number: [id number]  Date of Birth: Sep 26, 1965 Sex: Female         Room/Bed: 5C08C/5C08C-01 Payor Info: Payor: MEDICARE / Plan: MEDICARE PART A AND B / Product Type: *No Product type* /    Admit Date/Time:  10/06/2021  2:48 PM  Primary Diagnosis:  Femur fracture [facility])  Hospital Problems: Principal Problem:   Femur fracture [facility])    Expected Discharge Date: Expected Discharge Date: 10/29/21  Team Members Present: Physician leading conference: Dr. Leeroy Cha Social Worker Present: Erlene Quan, BSW Nurse Present: Dorien Chihuahua, RN PT Present: Becky Sax, PT OT Present: Other (comment) [facility] Alphonsa Gin, OT) SLP Present: Charolett Bumpers, SLP PPS Coordinator present : Gunnar Fusi, SLP     Current Status/Progress Goal Weekly Team Focus  Bowel/Bladder   (P) Continent of both B/B. Last LBM 2/7 was constipated         Swallow/Nutrition/ Hydration             ADL's   per eval was set up A for grooming, supervision UB dressing, but required max A +2 for LB dressing, toileitng and toilet transfers with the stedy  CGA  activity tolerance, pain management strategies, functional transfers   Mobility   transfers with Stedy max A +2  supervision, CGA gait, and min A steps  standing tolerance, pain reduction, functional mobility/transfers, generalized strengthening, dynamic standing balance   Communication             Safety/Cognition/ Behavioral Observations            Pain   (P) Has pain on daily basis requiring medication several times during the day.         Skin               Discharge Planning:  discharging home with spouse. Able to provide intermittent assistance, spouse is a Therapist, music. Patient drives to HD (5 mins away from home)   Team Discussion: Patient with traumatic experience with therapy due to extreme fear of  movement and knee pain with osteoporosis. MD reviewed xray results; initiated voltaren gel and knee sleeve.  Patient on target to meet rehab goals: Currently needs max assist to total assist for standing - BSC transfers. Goals for discharge set for supervision - mod I overall.  *See Care Plan and progress notes for long and short-term goals.   Revisions to Treatment Plan:  Adjust therapy schedule Knee sleeve   Teaching Needs: Safety, transfers, toileting, medications, etc  Current Barriers to Discharge: Decreased caregiver support and Hemodialysis  Possible Resolutions to Barriers: Family education     Medical Summary Current Status: anxiety, right knee OA, overweight, postoperative pain, femur fracture  Barriers to Discharge: Medical stability;Weight;Wound care;Behavior;Hemodialysis;Decreased family/caregiver support  Barriers to Discharge Comments: anxiety, right knee OA, overweight, postoperative pain, femur fracture Possible Resolutions to [employer]: discussed results of right knee XR, voltaren gel ordered, robaxin ordered, scheduled tylenol, continue opioids prn, hemodialysis, monitor wound daily   Continued Need for Acute Rehabilitation Level of Care: The patient requires daily medical management by a physician with specialized training in physical medicine and rehabilitation for the following reasons: Direction of a multidisciplinary physical rehabilitation program to maximize functional independence : Yes Medical management of patient stability for increased activity during participation in an intensive rehabilitation regime.: Yes Analysis of laboratory values and/or radiology  reports with any subsequent need for medication adjustment and/or medical intervention. : Yes   I attest that I was present, lead the team conference, and concur with the assessment and plan of the team.   Dorien Chihuahua B 10/08/2021, 11:46 AM

## 2021-10-09 ENCOUNTER — Other Ambulatory Visit: Payer: Self-pay | Admitting: Orthopedic Surgery

## 2021-10-09 ENCOUNTER — Inpatient Hospital Stay (HOSPITAL_COMMUNITY): Payer: Medicare Other

## 2021-10-09 DIAGNOSIS — S79002S Unspecified physeal fracture of upper end of left femur, sequela: Secondary | ICD-10-CM | POA: Diagnosis not present

## 2021-10-09 DIAGNOSIS — S72002S Fracture of unspecified part of neck of left femur, sequela: Secondary | ICD-10-CM | POA: Diagnosis not present

## 2021-10-09 DIAGNOSIS — N186 End stage renal disease: Secondary | ICD-10-CM | POA: Diagnosis not present

## 2021-10-09 DIAGNOSIS — E1169 Type 2 diabetes mellitus with other specified complication: Secondary | ICD-10-CM | POA: Diagnosis not present

## 2021-10-09 LAB — GLUCOSE, CAPILLARY: Glucose-Capillary: 147 mg/dL — ABNORMAL HIGH (ref 70–99)

## 2021-10-09 LAB — HEPATITIS B SURFACE ANTIBODY, QUANTITATIVE: Hep B S AB Quant (Post): <3.1 m[IU]/mL — ABNORMAL LOW (ref >9.9)

## 2021-10-09 IMAGING — MR MR KNEE*R* W/O CM
7 series · 40 of 40 positions shown · non-contrast
Comparison: X-ray [DATE]

CLINICAL DATA: Right knee pain after fall.  Abnormal x-ray

EXAM:
MRI OF THE RIGHT KNEE WITHOUT CONTRAST
TECHNIQUE: Multiplanar, multisequence MR imaging of the knee was performed. No
intravenous contrast was administered.

[Series 8: T2 fat-sat · axial · right · 4.0mm · 0.40mm/px · z∈[-53,+86]mm · 5 of 29 slices shown (1 of 4)]
[im 1/29]
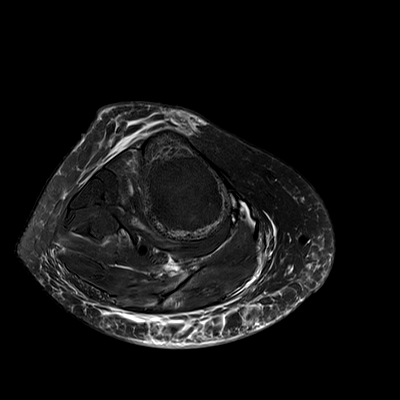
[im 8/29]
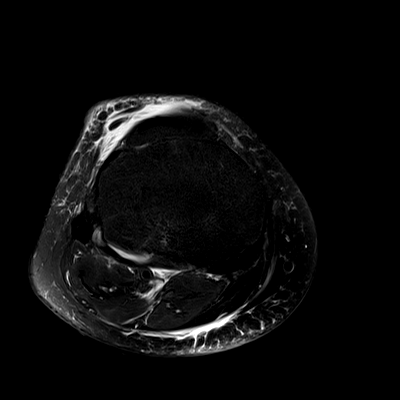
[im 15/29]
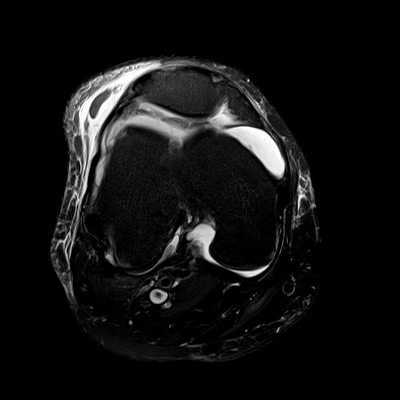
[im 22/29]
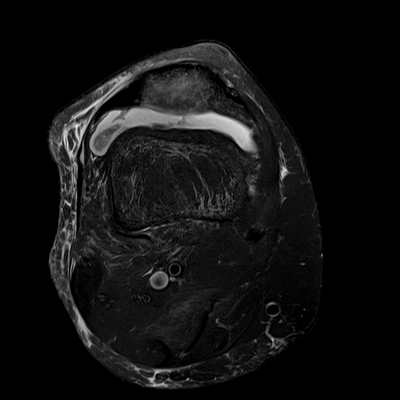
[im 29/29]
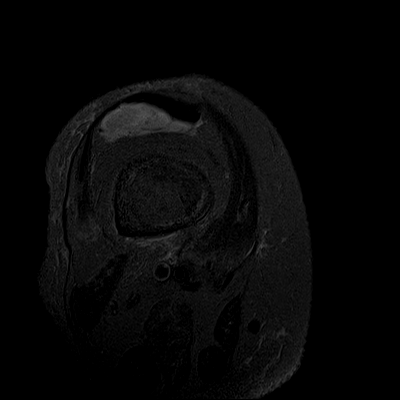

[Series 9: T1 · coronal · right · 4.0mm · 0.47mm/px · 5 of 30 slices shown]
[im 1/30]
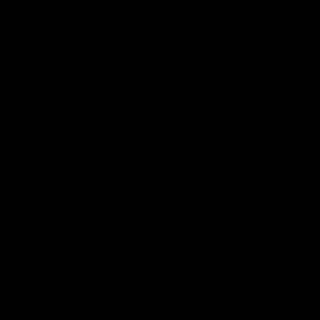
[im 8/30]
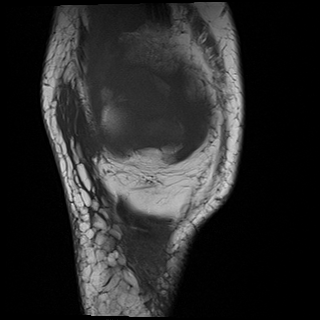
[im 15/30]
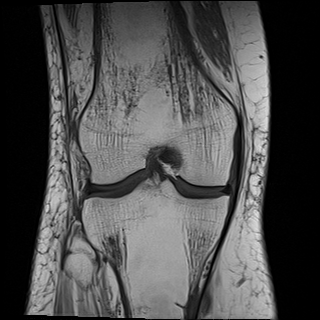
[im 22/30]
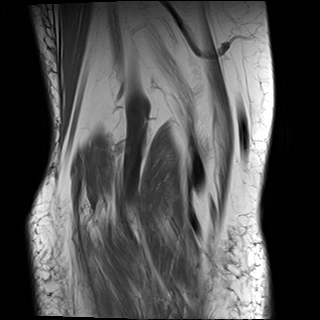
[im 30/30]
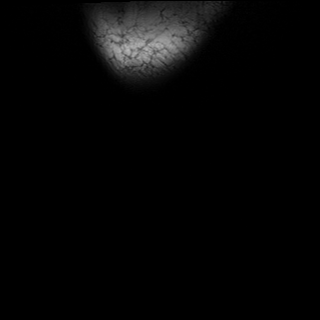

[Series 10: T2 fat-sat · coronal · right · 4.0mm · 0.59mm/px · 5 of 30 slices shown (2 of 4)]
[im 1/30]
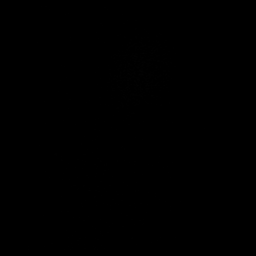
[im 8/30]
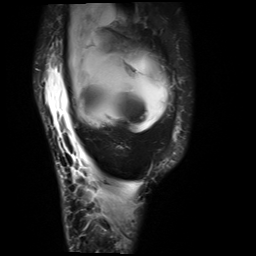
[im 15/30]
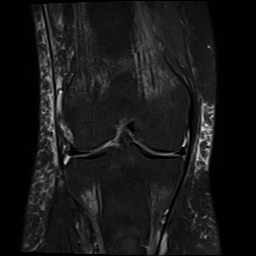
[im 22/30]
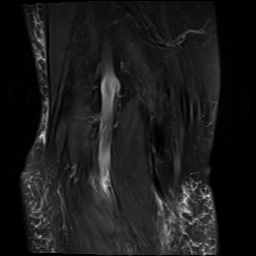
[im 30/30]
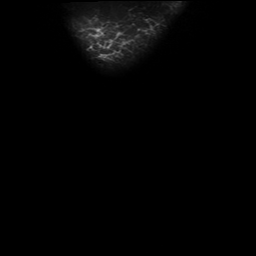

[Series 11: PD fat-sat · coronal · right · 3.0mm · 0.44mm/px · 7 of 38 slices shown (1 of 2)]
[im 1/38]
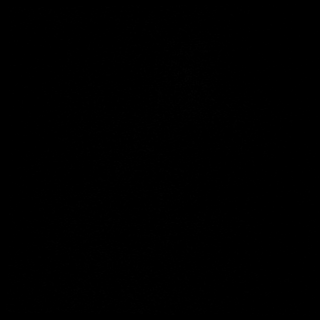
[im 7/38]
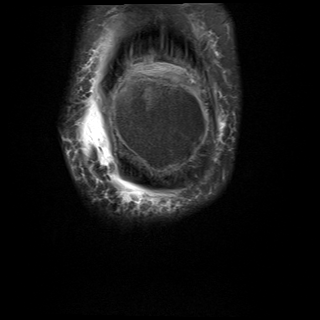
[im 13/38]
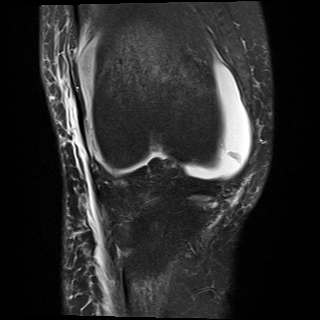
[im 19/38]
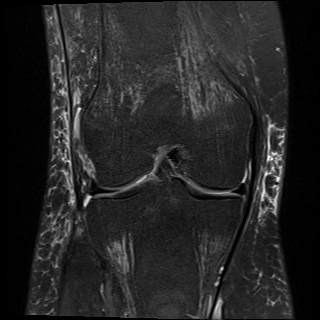
[im 25/38]
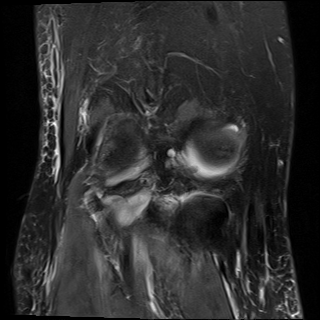
[im 31/38]
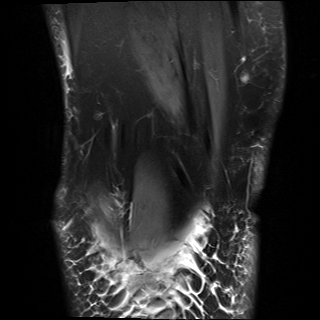
[im 38/38]
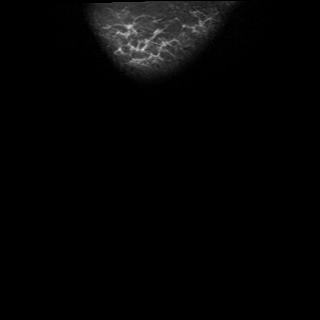

[Series 12: PD fat-sat · sagittal · right · 3.0mm · 0.52mm/px · 6 of 34 slices shown (2 of 2)]
[im 1/34]
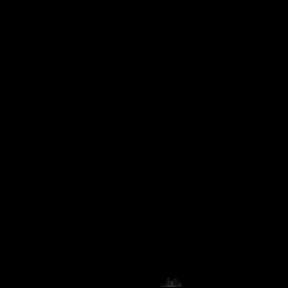
[im 7/34]
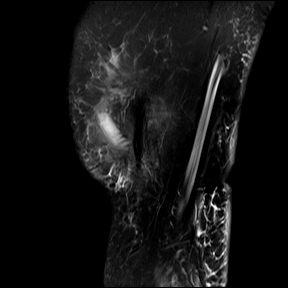
[im 14/34]
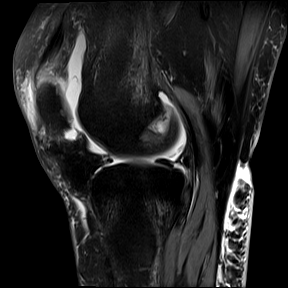
[im 20/34]
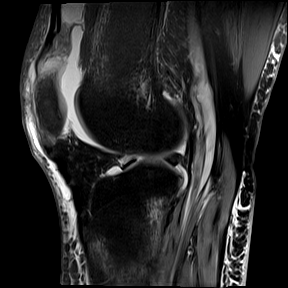
[im 27/34]
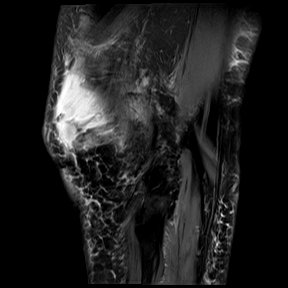
[im 34/34]
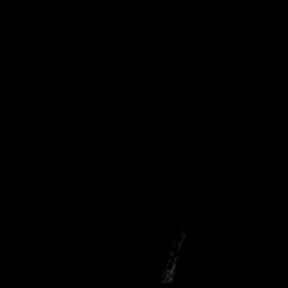

[Series 13: T2 fat-sat · sagittal · right · 3.0mm · 0.52mm/px · 6 of 34 slices shown (3 of 4)]
[im 1/34]
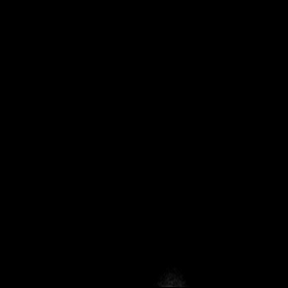
[im 7/34]
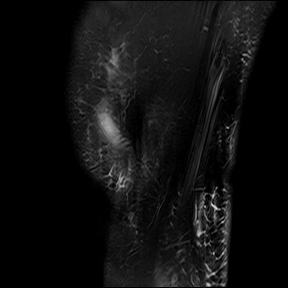
[im 14/34]
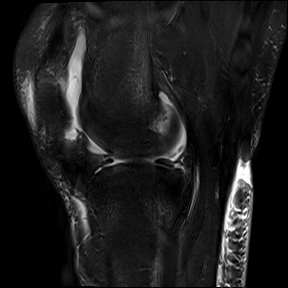
[im 20/34]
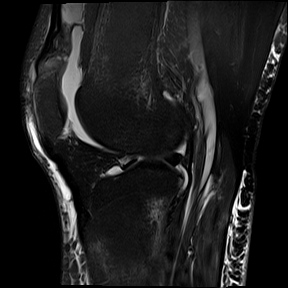
[im 27/34]
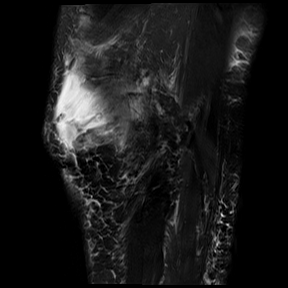
[im 34/34]
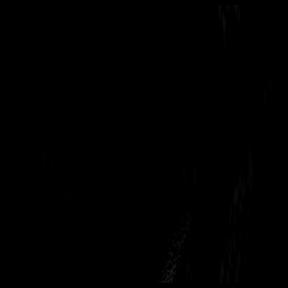

[Series 14: T2 fat-sat · sagittal · right · 3.0mm · 0.47mm/px · 6 of 34 slices shown (4 of 4)]
[im 1/34]
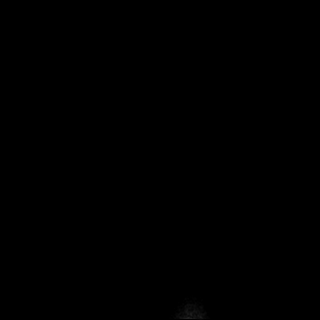
[im 7/34]
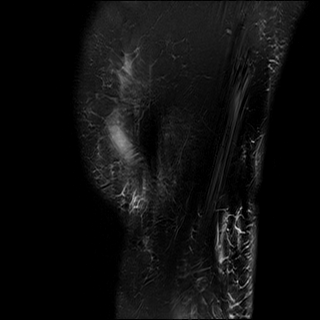
[im 14/34]
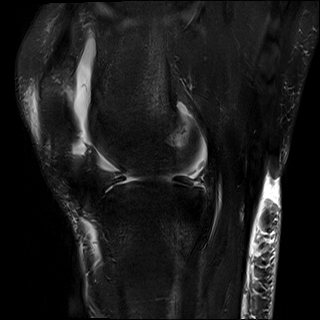
[im 20/34]
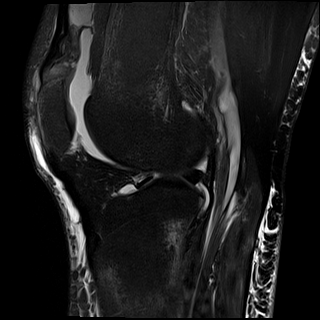
[im 27/34]
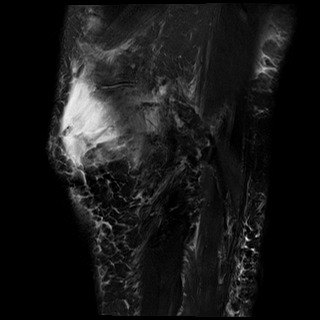
[im 34/34]
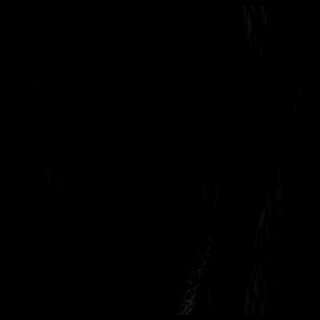

[40 of 40 positions shown; findings below may reference images not displayed]

FINDINGS: Technical Note: Despite efforts by the technologist and patient,
motion artifact is present on today's exam and could not be
eliminated. This reduces exam sensitivity and specificity.

MENISCI

Medial meniscus:  Intact.

Lateral meniscus:  Intact.

LIGAMENTS

Cruciates: Intact ACL and PCL.

Collaterals: Intact MCL. Lateral collateral ligament complex intact.

CARTILAGE

Patellofemoral: Irregular full-thickness cartilage loss involving
the superior aspect of the lateral and medial patellar facets, which
may be degenerative or posttraumatic. High-grade near-full thickness
cartilage defect within the central trochlear groove measuring
approximately 11 x 5 mm.

Medial: Mild diffuse chondral thinning of the weight-bearing medial
compartment.

Lateral:  No chondral defect.

MISCELLANEOUS

Joint: Large complex knee joint effusion. Thickened, edematous
appearance of the suprapatellar fat pad.

Popliteal Fossa:  No Baker's cyst. Intact popliteus tendon.

Extensor Mechanism: High-grade, near full-thickness tear of the
distal quadriceps tendon with less than 1 cm of retraction. The
superficial most aspect of the distal tendon remains intact. Low
signal cortical avulsion fragments are also avulsed from the
superior cortex of the patella (series 12, image 17). Laxity of the
patellar tendon without tear.

Bones: Cortical avulsion of the superior pole of the patella.
Patella baja alignment. Patchy marrow edema within the distal femur
and proximal tibia are nonspecific and could be posttraumatic or
related to osteopenia. Previously described area of relative
decreased density in the proximal tibial metaphysis is consistent
with fatty bone marrow. Similar findings are seen within the distal
femoral metaphysis (series 9, image 17). No suspicious marrow
replacing bone lesion.

Other: Diffuse soft tissue swelling and edema with small amount of
ill-defined hemorrhage along the anterolateral aspect of the knee.
IMPRESSION: 1. High-grade, near full-thickness mildly retracted tear of the
distal quadriceps tendon with associated cortical avulsion of the
superior pole of the patella.
2. Large complex knee joint effusion.
3. Patchy marrow edema within the distal femur and proximal tibia
are nonspecific and could be posttraumatic or related to osteopenia.
4. Previously described area of decreased density in the proximal
tibial metaphysis is consistent with fatty bone marrow. No
suspicious marrow replacing bone lesion.
5. Patellofemoral compartment osteoarthritis with high-grade
near-full-thickness cartilage defects as described above.
6. Intact menisci.  Intact cruciate and collateral ligaments.

## 2021-10-09 MED ORDER — DOCUSATE SODIUM 100 MG PO CAPS: 100.0000 mg | ORAL_CAPSULE | Freq: Every day | ORAL | Status: DC | PRN

## 2021-10-09 MED ORDER — FERRIC CITRATE 1 GM 210 MG(FE) PO TABS
210.0000 mg | ORAL_TABLET | ORAL | Status: DC | PRN
  Filled 2021-10-09: qty 1

## 2021-10-09 MED ORDER — FERRIC CITRATE 1 GM 210 MG(FE) PO TABS
420.0000 mg | ORAL_TABLET | Freq: Three times a day (TID) | ORAL | Status: DC
  Administered 2021-10-09 – 2021-10-15 (×18): 420 mg via ORAL
  Filled 2021-10-09 (×24): qty 2

## 2021-10-09 NOTE — Progress Notes (Signed)
Orthopedic Tech Progress Note Patient Details:  [name] 1966-08-01 [id number]  Ortho Devices Type of Ortho Device: Knee Immobilizer Ortho Device/Splint Location: RLE Ortho Device/Splint Interventions: Ordered   Post Interventions Patient Tolerated: Well Instructions Provided: Care of device  Emma Birchler A Tariyah Pendry 10/09/2021, 2:05 PM

## 2021-10-09 NOTE — Progress Notes (Signed)
Inpatient [facility] Individual Statement of Services  Patient Name:  [name]  Date:  10/09/2021  Welcome to the [facility].  Our goal is to provide you with an individualized program based on your diagnosis and situation, designed to meet your specific needs.  With this comprehensive rehabilitation program, you will be expected to participate in at least 3 hours of rehabilitation therapies Monday-Friday, with modified therapy programming on the weekends.  Your rehabilitation program will include the following services:  Physical Therapy (PT), Occupational Therapy (OT), Speech Therapy (ST), 24 hour per day rehabilitation nursing, Therapeutic Recreaction (TR), Neuropsychology, Care Coordinator, Rehabilitation Medicine, Nutrition Services, and Pharmacy Services  Weekly team conferences will be held on Wednesdays to discuss your progress.  Your Inpatient Rehabilitation Care Coordinator will talk with you frequently to get your input and to update you on team discussions.  Team conferences with you and your family in attendance may also be held.  Expected length of stay:  10-14 Days  Overall anticipated outcome:  Supervision to Min A  Depending on your progress and recovery, your program may change. Your Inpatient Rehabilitation Care Coordinator will coordinate services and will keep you informed of any changes. Your Inpatient Rehabilitation Care Coordinator's name and contact numbers are listed  below.  The following services may also be recommended but are not provided by the [facility]:   [place] will be made to provide these services after discharge if needed.  Arrangements include referral to agencies that provide these services.  Your insurance has been verified to be:  Medicare A & B Your primary doctor is:  NO PCP  Pertinent information will be shared  with your doctor and your insurance company.  Inpatient Rehabilitation Care Coordinator:  Erlene Quan, [place] or [phone]  Information discussed with and copy given to patient by: [name], 10/09/2021, 9:17 AM

## 2021-10-09 NOTE — Progress Notes (Signed)
Pt has a blood sugar monitor that was brought in by family. BG 227, 2 units of insulin given.

## 2021-10-09 NOTE — Progress Notes (Addendum)
Physical Therapy Session Note  Patient Details  Name: [name] MRN: [id number] Date of Birth: 11-Sep-1965  Today's Date: 10/09/2021 PT Individual Time: 3668-1594 and 7076-1518 PT Individual Time Calculation (min): 41 min and 27 min  Short Term Goals: Week 1:  PT Short Term Goal 1 (Week 1): Pt will transfer sit<>stand with LRAD and max A of 1 PT Short Term Goal 2 (Week 1): Pt will transfer bed<>chair with LRAD and max A of 1 PT Short Term Goal 3 (Week 1): Pt will initate gait training  Skilled Therapeutic Interventions/Progress Updates:   Treatment Session 1 Received pt supine in bed asleep with RN present administering medications. Pt requested increased time to arouse and reported soreness along L lateral thigh but denied any pain at start of session. Pt also reported not sleeping well last night due to pain from uncomfortable positioning during MRI. Session with emphasis on dressing, bed mobility, and hygiene management with ultimate goal to trial slideboard to get into WC. Pt rolled L and R with max A and use of bedrails to don brief, however before brief was fastened pt had BM on chuck pad. Removed soiled chuck pad and brief and performed peri-care in sidelying dependently - noted dark black stool (RN aware). Rolled L/R again with max A and use of bedrails with cues for rolling technique and placed clean chuck pad and donned brief. Pt able to don shorts up to mid-thighs with supervision but required max A to pull over hips via rolling L/R. Concluded session with pt semi-reclined in bed, needs within reach, and bed alarm on resting prior to upcoming OT session.  Of note, pt required significantly increased time with dressing and rolling in bed due to pain. Pt frequently crying out in pain with minimal movement and required numerous rest breaks in between rolling bouts.   MRI results revealed near full-thickness mildly retracted tear of the R distal quadriceps tendon. MD recommends to hold on  standing activities until ortho is consulted, but okay to do slideboard transfers.   Treatment Session 2 Received pt semi-reclined in bed and agreeable to PT. Per pt, plan for surgery to repair R quad tendon tear on Tuesday 2/14. Pt reported MD placed hold on therapy for day, however no update in chart and MD told this PT 2 hours prior that pt allowed to participate in therapy excluding standing (including theraex to tolerance and slideboard transfers). Per orthopedic PA note from 11:53am on 2/9, pt "can WBAT in [place] or hinged knee brace locked in extension" - plan for repair on Thursday 2/16. Pt requesting to perform gentle theraex to maintain mobility and promote blood flow, therefore performed the following exercises in pain free ROM with caution to avoid using/straining R quad tendon: -hip adduction 2x10 unresisted - pt actually reporting soreness along LLE (unaffected LE) and demonstrating greater ROM on RLE>LLE -ankle pumps x20 bilaterally Increased time spent educating pt on anatomy behind injury as well as expected healing timeline/recovery process - pt very appreciative. Concluded session with pt semi-reclined in bed, needs within reach, and bed alarm on.   Secure chat sent to MD to clarify pt's restrictions until surgery.  Therapy Documentation Precautions:  Precautions Precautions: Fall Precaution Comments: L AVF, R IJ perm-cath ( will need new dialysis cath) Restrictions Weight Bearing Restrictions: No RLE Weight Bearing: Weight bearing as tolerated LLE Weight Bearing: Weight bearing as tolerated  Therapy/Group: Individual Therapy Alfonse Alpers PT, DPT   10/09/2021, 7:14 AM

## 2021-10-09 NOTE — Progress Notes (Signed)
Occupational Therapy Session Note  Patient Details  Name: [name] MRN: [id number] Date of Birth: [birth year]  Today's Date: 10/09/2021 OT Individual Time: 0902-1002 OT Individual Time Calculation (min): 60 min    Short Term Goals: Week 1:  OT Short Term Goal 1 (Week 1): Pt will stand with max A of 1 in the stedy OT Short Term Goal 2 (Week 1): Pt will improve pain management <8/10 during ADLs OT Short Term Goal 3 (Week 1): Pt will complete LB dressing with max A of 1  Skilled Therapeutic Interventions/Progress Updates:  Pt greeted supine in bed agreeable to OT intervention. Session focus on bed level LB therex, bed mobility, education on coping strategies to manage new diagnosis and stress mgmt education to promote improved well being.  Pt requested to start with gentle LB therex from long sitting with pt completing below therex:                X20 ankle pumps X20 quad sets X20 hip ABD Worked on supine>sit using gait belt to maneuver BLEs to EOB, during transition Dr. Ranell Patrick entered during session informing pt of MRI results. Dr. Ranell Patrick recommended to this OTA that pt can participate in therapy excluding standing.  Remainder of session with a focus on stress mgmt, education on healthy coping strategies, and social interaction. Focus of session on providing coping strategies to manage new current level of function as a result of new diagnosis.  Session focus on breaking down stressors into daily hassles, major life stressors and life circumstances in an effort to allow pt to chunk her stressors into groups. Pt actively sharing stressors and contributing to conversation. Provided active listening, emotional support and therapeutic use of self. Offered education on factors that protect [place] against stress such as daily uplifts, healthy coping strategies and protective factors. Encouraged pt  to make an effort to actively recall one event from their day that was a daily uplift in an  effort to protect their mindset from stressors as well as sharing this information with their caregivers to facilitate improved caregiver communication and decrease overall burden of care.  Issued pt handouts on healthy coping strategies to implement into routine. Pt left supine in bed with bed alarm activated and all needs within reach.   Therapy Documentation Precautions:  Precautions Precautions: Fall Precaution Comments: L AVF, R IJ perm-cath ( will need new dialysis cath) Restrictions Weight Bearing Restrictions: No RLE Weight Bearing: Weight bearing as tolerated LLE Weight Bearing: Weight bearing as tolerated  Pain:unrated pain reported in BLE ( L>R) provided rest breaks and repositioning as needed.     Therapy/Group: Individual Therapy  Precious Haws 10/09/2021, 12:13 PM

## 2021-10-09 NOTE — Progress Notes (Signed)
PROGRESS NOTE   Subjective/Complaints: MRI right knee shows large quadriceps tear- ortho consulted and plan for surgery on Tuesday- knee immobilizer ordered. Have asked ortho for therapy precautions until surgery.   ROS: +hip pain, +right knee pain  Objective:   MR KNEE RIGHT WO CONTRAST  Result Date: 10/09/2021 CLINICAL DATA:  Right knee pain after fall.  Abnormal x-ray EXAM: MRI OF THE RIGHT KNEE WITHOUT CONTRAST TECHNIQUE: Multiplanar, multisequence MR imaging of the knee was performed. No intravenous contrast was administered. COMPARISON:  X-ray 10/07/2021 FINDINGS: Technical Note: Despite efforts by the technologist and patient, motion artifact is present on today's exam and could not be eliminated. This reduces exam sensitivity and specificity. MENISCI Medial meniscus:  Intact. Lateral meniscus:  Intact. LIGAMENTS Cruciates: Intact ACL and PCL. Collaterals: Intact MCL. Lateral collateral ligament complex intact. CARTILAGE Patellofemoral: Irregular full-thickness cartilage loss involving the superior aspect of the lateral and medial patellar facets, which may be degenerative or posttraumatic. High-grade near-full thickness cartilage defect within the central trochlear groove measuring approximately 11 x 5 mm. Medial: Mild diffuse chondral thinning of the weight-bearing medial compartment. Lateral:  No chondral defect. MISCELLANEOUS Joint: Large complex knee joint effusion. Thickened, edematous appearance of the suprapatellar fat pad. Popliteal Fossa:  No Baker's cyst. Intact popliteus tendon. Extensor Mechanism: High-grade, near full-thickness tear of the distal quadriceps tendon with less than 1 cm of retraction. The superficial most aspect of the distal tendon remains intact. Low signal cortical avulsion fragments are also avulsed from the superior cortex of the patella (series 12, image 17). Laxity of the patellar tendon without tear. Bones:  Cortical avulsion of the superior pole of the patella. Patella baja alignment. Patchy marrow edema within the distal femur and proximal tibia are nonspecific and could be posttraumatic or related to osteopenia. Previously described area of relative decreased density in the proximal tibial metaphysis is consistent with fatty bone marrow. Similar findings are seen within the distal femoral metaphysis (series 9, image 17). No suspicious marrow replacing bone lesion. Other: Diffuse soft tissue swelling and edema with small amount of ill-defined hemorrhage along the anterolateral aspect of the knee. IMPRESSION: 1. High-grade, near full-thickness mildly retracted tear of the distal quadriceps tendon with associated cortical avulsion of the superior pole of the patella. 2. Large complex knee joint effusion. 3. Patchy marrow edema within the distal femur and proximal tibia are nonspecific and could be posttraumatic or related to osteopenia. 4. Previously described area of decreased density in the proximal tibial metaphysis is consistent with fatty bone marrow. No suspicious marrow replacing bone lesion. 5. 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 6. Intact menisci.  Intact cruciate and collateral ligaments. Electronically Signed   By: Davina Poke D.O.   On: 10/09/2021 08:18   DG Knee Right Port  Result Date: 10/07/2021 CLINICAL DATA:  Trauma, fall, pain EXAM: PORTABLE RIGHT KNEE - 1-2 VIEW COMPARISON:  None. FINDINGS: No recent fracture or dislocation is seen. There is small to moderate effusion in the suprapatellar bursa. Erosive change seen in the posterior margin of patella may be due to degenerative arthritis. There are faint calcifications in the quadriceps tendon close to patella, possibly  calcific tendinosis or bursitis. Osteopenia is seen in bony structures. In the AP view, there is 4.2 x 3.1 cm area of subtle decreased density with  possible disruption of trabeculae in the proximal shaft of tibia. There is no break in the cortical margins. IMPRESSION: No recent fracture or dislocation is seen. Small to moderate effusion is present in the suprapatellar bursa. There is ill-defined 4.2 x 3.1 cm area of decreased density with possible disruption of trabeculae in the central portion of proximal shaft of tibia. This may be due to osteopenia or suggest a space-occupying lesion. Comparison with previous studies or follow-up CT or MRI may be considered. Electronically Signed   By: Elmer Picker M.D.   On: 10/07/2021 15:20   No results for input(s): WBC, HGB, HCT, PLT in the last 72 hours.  No results for input(s): NA, K, CL, CO2, GLUCOSE, BUN, CREATININE, CALCIUM in the last 72 hours.   Intake/Output Summary (Last 24 hours) at 10/09/2021 1131 Last data filed at 10/09/2021 0730 Gross per 24 hour  Intake 237 ml  Output 3000 ml  Net -2763 ml        Physical Exam: Vital Signs Blood pressure (!) 147/78, pulse 73, temperature 98.1 F (36.7 C), temperature source Oral, resp. rate 18, height 5\' 7"  (1.702 m), weight 81.4 kg, SpO2 99 %. Gen: no distress, normal appearing, BMI 29.11 HEENT: oral mucosa pink and moist, NCAT Cardio: Reg rate Chest: normal effort, normal rate of breathing Abd: soft, non-distended Ext: no edema Musculoskeletal:        General: Swelling and tenderness present.     Cervical back: Normal range of motion and neck supple.     Right lower leg: Edema present.     Left lower leg: Edema present.     Comments: Left hip tender to palpation and PROM with associated swelling. Left knee with mild effusion, patella laxity. Right hip tender with leg raise  Right knee tender and swollen Skin:    Comments: Incision site dressing clean dry and intact.   Neurological:     Comments: Patient is alert.  No acute distress.  Oriented x3 and follows commands. Alert and oriented x 3. Normal insight and awareness. Intact  Memory. Normal language and speech. Cranial nerve exam unremarkable. UE motor 5/5. LE motor limited proximally by pain. ADF/PF 4-5/5. No sensory findings.   Psychiatric:     Comments: Pleasant, slightly anxious    Assessment/Plan: 1. Functional deficits which require 3+ hours per day of interdisciplinary therapy in a comprehensive inpatient rehab setting. Physiatrist is providing close team supervision and 24 hour management of active medical problems listed below. Physiatrist and rehab team continue to assess barriers to discharge/monitor patient progress toward functional and medical goals  Care Tool:  Bathing  Bathing activity did not occur: Safety/medical concerns Body parts bathed by patient: Right arm, Left arm, Chest, Abdomen, Right upper leg, Left upper leg         Bathing assist Assist Level: Set up assist     Upper Body Dressing/Undressing Upper body dressing   What is the patient wearing?: Pull over shirt    Upper body assist Assist Level: Supervision/Verbal cueing    Lower Body Dressing/Undressing Lower body dressing      What is the patient wearing?: Pants     Lower body assist Assist for lower body dressing: 2 Helpers     Toileting Toileting    Toileting assist Assist for toileting: 2 Helpers     Transfers  Chair/bed transfer  Transfers assist     Chair/bed transfer assist level: 2 Helpers     Locomotion Ambulation   Ambulation assist   Ambulation activity did not occur: Safety/medical concerns (weakness, fatigue, pain, anxiety)          Walk 10 feet activity   Assist  Walk 10 feet activity did not occur: Safety/medical concerns (weakness, fatigue, pain, anxiety)        Walk 50 feet activity   Assist Walk 50 feet with 2 turns activity did not occur: Safety/medical concerns (weakness, fatigue, pain, anxiety)         Walk 150 feet activity   Assist Walk 150 feet activity did not occur: Safety/medical concerns (weakness,  fatigue, pain, anxiety)         Walk 10 feet on uneven surface  activity   Assist Walk 10 feet on uneven surfaces activity did not occur: Safety/medical concerns (weakness, fatigue, pain, anxiety)         Wheelchair     Assist Is the patient using a wheelchair?: Yes Type of Wheelchair: Manual Wheelchair activity did not occur: Safety/medical concerns (weakness, fatigue, pain, anxiety)         Wheelchair 50 feet with 2 turns activity    Assist    Wheelchair 50 feet with 2 turns activity did not occur: Safety/medical concerns (weakness, fatigue, pain, anxiety)       Wheelchair 150 feet activity     Assist  Wheelchair 150 feet activity did not occur: Safety/medical concerns (weakness, fatigue, pain, anxiety)       Blood pressure (!) 147/78, pulse 73, temperature 98.1 F (36.7 C), temperature source Oral, resp. rate 18, height 5\' 7"  (1.702 m), weight 81.4 kg, SpO2 99 %.    Medical Problem List and Plan: 1. Functional deficits secondary to nondisplaced fracture right inferior pubic ramus as well as a displaced foreshortened fracture through the left femoral neck/intertrochanteric area.  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   -Weightbearing as tolerated with anterior total hip precautions.                                  -Conservative care right inferior pubic ramus fracture, weightbearing as tolerated             -patient may  shower             -ELOS/Goals: 10-14 days, supervision to min assist goals  Continue CIR  -HFU scheduled 2.  Antithrombotics: -DVT/anticoagulation:  Mechanical: Antiembolism stockings, thigh (TED hose) Bilateral lower extremities check vascular study.   -should be able to begin lovenox today as permacath placed            -antiplatelet therapy: N/A 3. Femur fracture pain: continue Flector patch,Robaxin 750 mg 4 times daily, hydrocodone and oxycodone as needed             -pt having significant spasms in left  thigh.             -add kpad to help with spasms  -schedule tylenol 1000mg  TID 4. Mood: Provide emotional support.  Melatonin as needed             -antipsychotic agents: N/A 5. Neuropsych: This patient is capable of making decisions on her own behalf. 6. Skin/Wound Care: Routine skin checks 7. Fluids/Electrolytes/Nutrition: Routine in and outs with follow-up chemistries 8.  End-stage renal disease/hemodialysis.  Permacath exchanged 10/03/2021.    -  Follow-up hemodialysis per Dr. [name] -HD later in day to allow participation in therapies during the day 9.  Acute on chronic anemia.  Has had transfusions already x 2. Hgb has stabilized in 7 range..  Continue iron supplement.              -epo per nephrology 10.  Diabetes mellitus with peripheral neuropathy. Placed order that she may use her Dexcom 6. Hemoglobin A1c 6.2.  SSI as prior to admission. Discuss Qutenza.  11.  Hypertension.  ContinueCozaar 50 mg daily, 12.  Hyperparathyroidism of renal origin.  Plan outpatient parathyroidectomy per Dr.Lateef 13.  Overweight.  BMI 29.11.  Dietary follow-up 14. Cushingoid?--outpt work up 28. Constipation: resolved. Decrease colace to daily prn.  16. Right quadriceps tear: plan for surgery Tuesday. Precautions as per ortho.   LOS: 3 days A FACE TO FACE EVALUATION WAS PERFORMED  Clide Deutscher Brayan Votaw 10/09/2021, 11:31 AM

## 2021-10-09 NOTE — Progress Notes (Signed)
Occupational Therapy Session Note  Patient Details  Name: [name] MRN: [id number] Date of Birth: September 27, 1965  Today's Date: 10/09/2021 OT Individual Time: 1415-1430 OT Individual Time Calculation (min): 15 min  and Today's Date: 10/09/2021 OT Missed Time: 90 Minutes Missed Time Reason: Other (comment);Patient fatigue;Patient unwilling/refused to participate without medical reason   Short Term Goals: Week 1:  OT Short Term Goal 1 (Week 1): Pt will stand with max A of 1 in the stedy OT Short Term Goal 2 (Week 1): Pt will improve pain management <8/10 during ADLs OT Short Term Goal 3 (Week 1): Pt will complete LB dressing with max A of 1   Skilled Therapeutic Interventions/Progress Updates:    Pt greeted at time of session bed level resting with husband present, pt stating she did not want to do any therapy today. Pt stating MD told her this AM to "hold therapy" but based on messages and communication, pt is not on hold but is to be careful with R knee and no standing. Pt continued to decline. KI in room, educated pt on how to don/doff and demonstrated on the pt, noted to be very large and not a good fit, will relay to MD to order another. Continued to decline therapy at this time, missed 45 mins of OT.   Therapy Documentation Precautions:  Precautions Precautions: Fall Precaution Comments: L AVF, R IJ perm-cath ( will need new dialysis cath) Restrictions Weight Bearing Restrictions: No RLE Weight Bearing: Weight bearing as tolerated LLE Weight Bearing: Weight bearing as tolerated     Therapy/Group: Individual Therapy  Viona Gilmore 10/09/2021, 7:29 AM

## 2021-10-09 NOTE — Progress Notes (Signed)
Patient went down for MRI of knee earlier this am and returned. Received pain medication after her return.

## 2021-10-09 NOTE — Progress Notes (Signed)
Pt's Dexcom sugar monitor reads 207

## 2021-10-09 NOTE — Consult Note (Addendum)
Reason for Consult:Quad tendon rupture Referring Physician: Leeroy [name] Time called: 1006 Time at bedside: [name] is an 56 y.o. female.  HPI: [name] is in CIR after a fall, left hip fx, and subsequent hip hemi. On the morning of admission she was getting up with some sort of lift assistance when her right knee gave way, popped, and was painful. MRI was obtained that showed a nearly complete quad tendon tear and orthopedic surgery was consulted. She is on disability from her HD but was mod I at home before all of these events.  Past Medical History:  Diagnosis Date   Anemia    vitamin d3 deficiency   Anxiety    Chronic kidney disease    End Stage Renal Disease   Diabetes mellitus without complication (HCC)    GERD (gastroesophageal reflux disease)    nothing over last few years   High serum parathyroid hormone (PTH)    checked through Dialysis   History of kidney stones 2000   Hypertension    Neuromuscular disorder (HCC)    neuropathy in feet   PONV (postoperative nausea and vomiting)    severe nausea requiring many doses of post op antiemetics   Stroke ([place]) 10/2017   thinks she had a series of mini strokes.right leg up to right side of face were numb. no loss of consciousness    Past Surgical History:  Procedure Laterality Date   ABDOMINAL HYSTERECTOMY  2007   AV FISTULA INSERTION W/ RF MAGNETIC GUIDANCE N/A 12/08/2017   Procedure: AV FISTULA INSERTION W/RF MAGNETIC GUIDANCE;  Surgeon: Elizabeth Cabal, MD;  Location: [facility];  Service: Cardiovascular;  Laterality: N/A;   DIALYSIS/PERMA CATHETER INSERTION Right 10/03/2021   Procedure: DIALYSIS/PERMA CATHETER INSERTION;  Surgeon: Elizabeth Cabal, MD;  Location: [facility];  Service: Cardiovascular;  Laterality: Right;   HIP ARTHROPLASTY Left 09/29/2021   Procedure: ARTHROPLASTY BIPOLAR HIP (HEMIARTHROPLASTY);  Surgeon: Elizabeth Sheehan, MD;  Location: [facility];  Service: Orthopedics;   Laterality: Left;   ROTATOR CUFF REPAIR Right 2004   SHOULDER CLOSED REDUCTION Right 2004   UPPER EXTREMITY VENOGRAPHY Left 02/15/2018   Procedure: UPPER EXTREMITY VENOGRAPHY;  Surgeon: Elizabeth Cabal, MD;  Location: [facility];  Service: Cardiovascular;  Laterality: Left;    Family History  Problem Relation Age of Onset   Emphysema Mother    COPD Mother    Cancer Father    Cancer Paternal Aunt    Diabetes Sister    Hypertension Sister    Eczema Sister    Diabetes Brother    Hypertension Brother    Cardiomyopathy Brother    Alcohol abuse Brother     Social History:  reports that she has never smoked. She has never used smokeless tobacco. She reports that she does not drink alcohol and does not use drugs.  Allergies:  Allergies  Allergen Reactions   Enalapril Hives and Other (See Comments)    Angioedema face.   Ivp Dye [Iodinated Contrast Media] Hives   Lisinopril Shortness Of Breath   Shellfish Allergy Swelling and Other (See Comments)    patient tolerates shellfish by mouth without problem    Medications: I have reviewed the patient's current medications.  Results for orders placed or performed during the hospital encounter of 10/06/21 (from the past 48 hour(s))  Glucose, capillary     Status: Abnormal   Collection Time: 10/08/21  6:20 AM  Result Value Ref Range  Glucose-Capillary 174 (H) 70 - 99 mg/dL    Comment: Glucose reference range applies only to samples taken after fasting for at least 8 hours.  Hepatitis B surface antigen     Status: None   Collection Time: 10/08/21  6:58 AM  Result Value Ref Range   Hepatitis B Surface Ag NON REACTIVE NON REACTIVE    Comment: Performed at [facility] [address], [place], [place] [address]  Hepatitis B surface antibody     Status: None   Collection Time: 10/08/21  6:58 AM  Result Value Ref Range   Hep B S Ab NON REACTIVE NON REACTIVE    Comment: (NOTE) Inconsistent with immunity, less than 10  mIU/mL.  Performed at [facility], [place] [address] [place], [place] [address]   Hepatitis B surface antibody,quantitative     Status: Abnormal   Collection Time: 10/08/21  6:58 AM  Result Value Ref Range   Hepatitis B-Post <3.1 (L) Immunity>9.9 mIU/mL    Comment: (NOTE)  Status of Immunity                     Anti-HBs Level  ------------------                     -------------- Inconsistent with Immunity                   0.0 - 9.9 Consistent with Immunity                          >9.9 Performed At: [facility] [place], [place] [id number] Rush Farmer MD [id number]   Glucose, capillary     Status: Abnormal   Collection Time: 10/08/21 11:37 AM  Result Value Ref Range   Glucose-Capillary 198 (H) 70 - 99 mg/dL    Comment: Glucose reference range applies only to samples taken after fasting for at least 8 hours.  Glucose, capillary     Status: Abnormal   Collection Time: 10/08/21  8:16 PM  Result Value Ref Range   Glucose-Capillary 172 (H) 70 - 99 mg/dL    Comment: Glucose reference range applies only to samples taken after fasting for at least 8 hours.    MR KNEE RIGHT WO CONTRAST  Result Date: 10/09/2021 CLINICAL DATA:  Right knee pain after fall.  Abnormal x-ray EXAM: MRI OF THE RIGHT KNEE WITHOUT CONTRAST TECHNIQUE: Multiplanar, multisequence MR imaging of the knee was performed. No intravenous contrast was administered. COMPARISON:  X-ray 10/07/2021 FINDINGS: Technical Note: Despite efforts by the technologist and patient, motion artifact is present on today's exam and could not be eliminated. This reduces exam sensitivity and specificity. MENISCI Medial meniscus:  Intact. Lateral meniscus:  Intact. LIGAMENTS Cruciates: Intact ACL and PCL. Collaterals: Intact MCL. Lateral collateral ligament complex intact. CARTILAGE Patellofemoral: Irregular full-thickness cartilage loss involving the superior aspect of the lateral and medial patellar facets, which  may be degenerative or posttraumatic. High-grade near-full thickness cartilage defect within the central trochlear groove measuring approximately 11 x 5 mm. Medial: Mild diffuse chondral thinning of the weight-bearing medial compartment. Lateral:  No chondral defect. MISCELLANEOUS Joint: Large complex knee joint effusion. Thickened, edematous appearance of the suprapatellar fat pad. Popliteal Fossa:  No Baker's cyst. Intact popliteus tendon. Extensor Mechanism: High-grade, near full-thickness tear of the distal quadriceps tendon with less than 1 cm of retraction. The superficial most aspect of the distal tendon remains  intact. Low signal cortical avulsion fragments are also avulsed from the superior cortex of the patella (series 12, image 17). Laxity of the patellar tendon without tear. Bones: Cortical avulsion of the superior pole of the patella. Patella baja alignment. Patchy marrow edema within the distal femur and proximal tibia are nonspecific and could be posttraumatic or related to osteopenia. Previously described area of relative decreased density in the proximal tibial metaphysis is consistent with fatty bone marrow. Similar findings are seen within the distal femoral metaphysis (series 9, image 17). No suspicious marrow replacing bone lesion. Other: Diffuse soft tissue swelling and edema with small amount of ill-defined hemorrhage along the anterolateral aspect of the knee. IMPRESSION: 1. High-grade, near full-thickness mildly retracted tear of the distal quadriceps tendon with associated cortical avulsion of the superior pole of the patella. 2. Large complex knee joint effusion. 3. Patchy marrow edema within the distal femur and proximal tibia are nonspecific and could be posttraumatic or related to osteopenia. 4. Previously described area of decreased density in the proximal tibial metaphysis is consistent with fatty bone marrow. No suspicious marrow replacing bone lesion. 5. 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 6. Intact menisci.  Intact cruciate and collateral ligaments. Electronically Signed   By: Davina Poke D.O.   On: 10/09/2021 08:18   DG Knee Right Port  Result Date: 10/07/2021 CLINICAL DATA:  Trauma, fall, pain EXAM: PORTABLE RIGHT KNEE - 1-2 VIEW COMPARISON:  None. FINDINGS: No recent fracture or dislocation is seen. There is small to moderate effusion in the suprapatellar bursa. Erosive change seen in the posterior margin of patella may be due to degenerative arthritis. There are faint calcifications in the quadriceps tendon close to patella, possibly calcific tendinosis or bursitis. Osteopenia is seen in bony structures. In the AP view, there is 4.2 x 3.1 cm area of subtle decreased density with possible disruption of trabeculae in the proximal shaft of tibia. There is no break in the cortical margins. IMPRESSION: No recent fracture or dislocation is seen. Small to moderate effusion is present in the suprapatellar bursa. There is ill-defined 4.2 x 3.1 cm area of decreased density with possible disruption of trabeculae in the central portion of proximal shaft of tibia. This may be due to osteopenia or suggest a space-occupying lesion. Comparison with previous studies or follow-up CT or MRI may be considered. Electronically Signed   By: Elmer Picker M.D.   On: 10/07/2021 15:20    Review of Systems  HENT:  Negative for ear discharge, ear pain, hearing loss and tinnitus.   Eyes:  Negative for photophobia and pain.  Respiratory:  Negative for cough and shortness of breath.   Cardiovascular:  Negative for chest pain.  Gastrointestinal:  Negative for abdominal pain, nausea and vomiting.  Genitourinary:  Negative for dysuria, flank pain, frequency and urgency.  Musculoskeletal:  Positive for arthralgias (Left hip, right knee). Negative for back pain, myalgias and neck pain.  Neurological:  Negative for  dizziness and headaches.  Hematological:  Does not bruise/bleed easily.  Psychiatric/Behavioral:  The patient is not nervous/anxious.   Blood pressure (!) 147/78, pulse 73, temperature 98.1 F (36.7 C), temperature source Oral, resp. rate 18, height 5\' 7"  (1.702 m), weight 81.4 kg, SpO2 99 %. Physical Exam Constitutional:      General: She is not in acute distress.    Appearance: She is well-developed. She is not diaphoretic.  HENT:     Head: Normocephalic and atraumatic.  Eyes:  General: No scleral icterus.       Right eye: No discharge.        Left eye: No discharge.     Conjunctiva/sclera: Conjunctivae normal.  Cardiovascular:     Rate and Rhythm: Normal rate and regular rhythm.  Pulmonary:     Effort: Pulmonary effort is normal. No respiratory distress.  Musculoskeletal:     Cervical back: Normal range of motion.     Comments: RLE No traumatic wounds, ecchymosis, or rash  Mod TTP knee, unable to SLR  No large knee or ankle effusion  Sens DPN, SPN, TN intact  Motor EHL, ext, flex, evers 5/5  DP 2+, PT 0, No significant edema  Skin:    General: Skin is warm and dry.  Neurological:     Mental Status: She is alert.  Psychiatric:        Mood and Affect: Mood normal.        Behavior: Behavior normal.    Assessment/Plan: Right quad tendon rupture -- Will likely need operative repair given degree but does not need acutely. Can WBAT in KI or hinged knee brace locked in extension. Will tentatively plan on repair Thursday with Dr. Mable Fill. F/u with Dr. Mable Fill at discharge.    Lisette Abu, PA-C Orthopedic Surgery [phone] 10/09/2021, 10:26 AM

## 2021-10-09 NOTE — Progress Notes (Signed)
[facility] Progress Note  Subjective: seen in room, no c/o  Vitals:   10/08/21 1900 10/08/21 1940 10/08/21 2030 10/09/21 0600  BP: 135/71 (!) 175/87 (!) 160/80 (!) 147/78  Pulse: 60 68 69 73  Resp: (!) 22 (!) 22 20 18   Temp:   97.7 F (36.5 C) 98.1 F (36.7 C)  TempSrc:   Oral Oral  SpO2: 98% 98% 99%   Weight: 81.4 kg     Height:        Exam: Gen alert, no distress Sclera anicteric, throat clear  No jvd or bruits Chest clear bilat to bases RRR no MRG Abd soft ntnd no mass or ascites +bs Ext bilat 1+ LE edema, L hip incision dressed Neuro is alert, Ox 3 , nf  RIJ TDC in place      Home meds include - auryxia 2 ac, norco prn, losartan 50 qd, rena-vit qd, insulin novolog, nepro bid, prns        OP HD: MWF [facility] [address] [phone])  3h 40min  81kg   Hep 3000 then 800u/hr  RIJ TDC       Assessment/ Plan: Debility - rehab per CIR Tendon rupture - RLE. Seen by orthopedics.  L hip fracture/ R pelvic ramus fx - sp L hemiarthroplasty on 09/29/21 at [facility] ESRD - on HD mwf. Had HD here Wed. Next HD tomorrow.  BP/ volume - at dry wt, cont losartan. UF 1-2 L w/ HD tomorrow.  DM2 - per pmd Anemia ckd - Hb 7- 8 range MBD ckd - cont auryxia, Ca and phos in range     Rob Trenice [place] 10/09/2021, 2:01 PM   Recent Labs  Lab 10/04/21 0447 10/06/21 0445  K 4.5 5.1  BUN 46* 71*  CREATININE 5.82* 9.02*  ALBUMIN 2.8* 2.9*  CALCIUM 9.1 9.1  PHOS 5.5* 5.9*  HGB 7.8* 7.7*    Inpatient medications:  acetaminophen  1,000 mg Oral TID   vitamin C  500 mg Oral Daily   Chlorhexidine Gluconate Cloth  6 each Topical BID   Chlorhexidine Gluconate Cloth  6 each Topical Q0600   diclofenac  1 patch Transdermal BID   diclofenac Sodium  2 g Topical QID   docusate sodium  100 mg Oral BID   enoxaparin (LOVENOX) injection  30 mg Subcutaneous Q24H   feeding supplement (NEPRO CARB STEADY)  237 mL Oral BID BM   ferric citrate  420 mg Oral TID WC   insulin aspart  0-6  Units Subcutaneous TID WC   losartan  50 mg Oral Daily   magnesium oxide  200 mg Oral BID   methocarbamol  750 mg Oral QID   multivitamin  1 tablet Oral QHS   senna  2 tablet Oral QHS   Vitamin D (Ergocalciferol)  50,000 Units Oral Q7 days    alum & mag hydroxide-simeth, bisacodyl, ferric citrate **AND** ferric citrate, HYDROcodone-acetaminophen, melatonin, ondansetron **OR** ondansetron (ZOFRAN) IV, oxyCODONE, polyethylene glycol

## 2021-10-09 NOTE — Evaluation (Signed)
Recreational Therapy Assessment and Plan  Patient Details  Name: [name] MRN: [id number] Date of Birth: November 26, 1965 Today's Date: 10/09/2021  Rehab Potential:  Fair ELOS:   d/c to acute for surgery next week  Hospital Problem: Principal Problem:   Femur fracture ([place])     Past Medical History:      Past Medical History:  Diagnosis Date   Anemia      vitamin d3 deficiency   Anxiety     Chronic kidney disease      End Stage Renal Disease   Diabetes mellitus without complication (HCC)     GERD (gastroesophageal reflux disease)      nothing over last few years   High serum parathyroid hormone (PTH)      checked through Dialysis   History of kidney stones 2000   Hypertension     Neuromuscular disorder ([place])      neuropathy in feet   PONV (postoperative nausea and vomiting)      severe nausea requiring many doses of post op antiemetics   Stroke ([place]) 10/2017    thinks she had a series of mini strokes.right leg up to right side of face were numb. no loss of consciousness    Past Surgical History:       Past Surgical History:  Procedure Laterality Date   ABDOMINAL HYSTERECTOMY   2007   AV FISTULA INSERTION W/ RF MAGNETIC GUIDANCE N/A 12/08/2017    Procedure: AV FISTULA INSERTION W/RF MAGNETIC GUIDANCE;  Surgeon: Katha Cabal, MD;  Location: [facility];  Service: Cardiovascular;  Laterality: N/A;   DIALYSIS/PERMA CATHETER INSERTION Right 10/03/2021    Procedure: DIALYSIS/PERMA CATHETER INSERTION;  Surgeon: Katha Cabal, MD;  Location: [place] [facility];  Service: Cardiovascular;  Laterality: Right;   HIP ARTHROPLASTY Left 09/29/2021    Procedure: ARTHROPLASTY BIPOLAR HIP (HEMIARTHROPLASTY);  Surgeon: Lovell Sheehan, MD;  Location: [facility];  Service: Orthopedics;  Laterality: Left;   ROTATOR CUFF REPAIR Right 2004   SHOULDER CLOSED REDUCTION Right 2004   UPPER EXTREMITY VENOGRAPHY Left 02/15/2018    Procedure: UPPER EXTREMITY VENOGRAPHY;  Surgeon:  Katha Cabal, MD;  Location: [place] [facility];  Service: Cardiovascular;  Laterality: Left;      Assessment & Plan Clinical Impression: Patient is a 56 y.o. year old female with history of end-stage renal disease with hemodialysis via right IJ, diabetes mellitus with neuropathy, acute on chronic anemia, hyperlipidemia, obesity with BMI 27.76, secondary hyperparathyroidism of renal origin and currently awaiting outpatient scheduling for evaluation of parathyroidectomy, hypertension.  Per chart review patient lives with spouse.  1 level home 5 steps to entry.  Modified independent with Rollator for household and community ambulation and bilateral axillary crutches for stair negotiation.  Presented to [facility] 09/28/2021 with complaints of severe left hip pain.  Patient states her leg gave out and she heard a pop on the left hip.  She also had complaints of right side hip pain from a fall.  X-rays and imaging revealed displaced foreshortened fracture of the neck and intertrochanteric left femur as well as acute nondisplaced fracture of the right inferior pubic ramus.  Patient underwent left hip anterior hip hemiarthroplasty 09/29/2021 per Dr. Kurtis Bushman.  Conservative care of right inferior pubic ramus fracture.  Patient is currently weightbearing as tolerated left lower extremity as well as weightbearing as tolerated right lower extremity.  Hospital course hemodialysis ongoing vascular surgery Dr Rica Koyanagi consulted to evaluate for exchange PermCath from right  IJ to better dialysis 10/03/2021.  Hospital course acute on chronic anemia latest hemoglobin 6.7 of which she was transfused and follow-up hemoglobin 7.7 and patient also remains on iron supplement.  Therapy evaluations completed due to patient decreased functional mobility was admitted for a comprehensive rehab program   Pt presents with decreased activity tolerance, decreased functional mobility, decreased balance, feeling of stress Limiting  pt's independence with leisure/community pursuits.  Met with pt today to discuss TR services including leisure education, activity analysis/modifications and stress management.  Also discussed the importance of social, emotional, spiritual health in addition to physical health and their effects on overall health and wellness.  Pt stated understanding.  Pt groggy during this session, reporting she had "taken a narcotic" for pain.    Plan  Min 1 TR session >20 minutes per week during LOS  Recommendations for other services: None   Discharge Criteria: Patient will be discharged from TR if patient refuses treatment 3 consecutive times without medical reason.  If treatment goals not met, if there is a change in medical status, if patient makes no progress towards goals or if patient is discharged from hospital.  The above assessment, treatment plan, treatment alternatives and goals were discussed and mutually agreed upon: by patient  [place] 10/09/2021, 4:16 PM

## 2021-10-10 DIAGNOSIS — E1169 Type 2 diabetes mellitus with other specified complication: Secondary | ICD-10-CM | POA: Diagnosis not present

## 2021-10-10 DIAGNOSIS — S79002S Unspecified physeal fracture of upper end of left femur, sequela: Secondary | ICD-10-CM | POA: Diagnosis not present

## 2021-10-10 DIAGNOSIS — N186 End stage renal disease: Secondary | ICD-10-CM | POA: Diagnosis not present

## 2021-10-10 DIAGNOSIS — S72002S Fracture of unspecified part of neck of left femur, sequela: Secondary | ICD-10-CM | POA: Diagnosis not present

## 2021-10-10 LAB — CBC
HCT: 24.4 % — ABNORMAL LOW (ref 36.0–46.0)
Hemoglobin: 8.1 g/dL — ABNORMAL LOW (ref 12.0–15.0)
MCH: 32.4 pg (ref 26.0–34.0)
MCHC: 33.2 g/dL (ref 30.0–36.0)
MCV: 97.6 fL (ref 80.0–100.0)
Platelets: 266 10*3/uL (ref 150–400)
RBC: 2.5 MIL/uL — ABNORMAL LOW (ref 3.87–5.11)
RDW: 14.6 % (ref 11.5–15.5)
WBC: 11.5 10*3/uL — ABNORMAL HIGH (ref 4.0–10.5)
nRBC: 0 % (ref 0.0–0.2)

## 2021-10-10 LAB — RENAL FUNCTION PANEL
Albumin: 2.9 g/dL — ABNORMAL LOW (ref 3.5–5.0)
Anion gap: 14 (ref 5–15)
BUN: 51 mg/dL — ABNORMAL HIGH (ref 6–20)
CO2: 26 mmol/L (ref 22–32)
Calcium: 8.7 mg/dL — ABNORMAL LOW (ref 8.9–10.3)
Chloride: 94 mmol/L — ABNORMAL LOW (ref 98–111)
Creatinine, Ser: 7.16 mg/dL — ABNORMAL HIGH (ref 0.44–1.00)
GFR, Estimated: 6 mL/min — ABNORMAL LOW (ref >60)
Glucose, Bld: 206 mg/dL — ABNORMAL HIGH (ref 70–99)
Phosphorus: 4.3 mg/dL (ref 2.5–4.6)
Potassium: 4.4 mmol/L (ref 3.5–5.1)
Sodium: 134 mmol/L — ABNORMAL LOW (ref 135–145)

## 2021-10-10 MED ORDER — SIMETHICONE 80 MG PO CHEW: 80.0000 mg | CHEWABLE_TABLET | Freq: Four times a day (QID) | ORAL | Status: DC | PRN

## 2021-10-10 MED ORDER — DARBEPOETIN ALFA 40 MCG/0.4ML IJ SOSY
40.0000 ug | PREFILLED_SYRINGE | INTRAMUSCULAR | Status: DC
  Administered 2021-10-10: 40 ug via INTRAVENOUS
  Filled 2021-10-10: qty 0.4

## 2021-10-10 NOTE — Progress Notes (Signed)
°  [facility] Progress Note   Subjective: Seen in room. No complaints this am. For dialysis today.   Objective Vitals:   10/08/21 2030 10/09/21 0600 10/09/21 1553 10/09/21 2000  BP: (!) 160/80 (!) 147/78 (!) 148/64 (!) 144/62  Pulse: 69 73 95 95  Resp: 20 18 17 16   Temp: 97.7 F (36.5 C) 98.1 F (36.7 C) 99 F (37.2 C) 98.2 F (36.8 C)  TempSrc: Oral Oral Oral   SpO2: 99%  95% 97%  Weight:      Height:         Additional Objective Labs: Basic Metabolic Panel: Recent Labs  Lab 10/04/21 0447 10/06/21 0445  NA 135 131*  K 4.5 5.1  CL 94* 94*  CO2 27 24  GLUCOSE 149* 172*  BUN 46* 71*  CREATININE 5.82* 9.02*  CALCIUM 9.1 9.1  PHOS 5.5* 5.9*   CBC: Recent Labs  Lab 10/04/21 0447 10/06/21 0445  WBC 4.8 6.3  NEUTROABS 3.5 5.0  HGB 7.8* 7.7*  HCT 23.6* 23.5*  MCV 95.9 95.5  PLT 173 188   Blood Culture No results found for: SDES, SPECREQUEST, CULT, REPTSTATUS   Physical Exam General: Well appearing, nad  Heart: RRR No m,r,g  Lungs: Clear bilaterally  Abdomen: soft non-tender  Extremities: Trace LE edema  Dialysis Access: R IJ TDC   Medications:   acetaminophen  1,000 mg Oral TID   vitamin C  500 mg Oral Daily   Chlorhexidine Gluconate Cloth  6 each Topical BID   Chlorhexidine Gluconate Cloth  6 each Topical Q0600   diclofenac  1 patch Transdermal BID   diclofenac Sodium  2 g Topical QID   enoxaparin (LOVENOX) injection  30 mg Subcutaneous Q24H   feeding supplement (NEPRO CARB STEADY)  237 mL Oral BID BM   ferric citrate  420 mg Oral TID WC   insulin aspart  0-6 Units Subcutaneous TID WC   losartan  50 mg Oral Daily   methocarbamol  750 mg Oral QID   multivitamin  1 tablet Oral QHS   Vitamin D (Ergocalciferol)  50,000 Units Oral Q7 days    OP HD:  MWF [facility] [address] [phone])  3h 45min  81kg   Hep 3000 then 800u/hr  RIJ TDC       Assessment/ Plan: Debility - rehab per CIR Tendon rupture - RLE. Seen by orthopedics.   L hip fracture/ R pelvic ramus fx - sp L hemiarthroplasty on 09/29/21 at [facility] ESRD - on HD mwf. Continue on schedule. HD today.  BP/ volume - at dry wt, cont losartan. UF 1-2 L w/ HD t DM2 - per pmd Anemia ckd - Hb 7- 8 range. S/p transfusion. Will start ESA here - Aranesp [address] on 2/10.  MBD ckd - cont auryxia, Ca and phos in range Dispo:  Discharge date 2/28    Lynnda Child PA-C [place] 10/10/2021,10:54 AM

## 2021-10-10 NOTE — Progress Notes (Signed)
Pt's Dexcom sugar monitor reading is 302.

## 2021-10-10 NOTE — Progress Notes (Signed)
Occupational Therapy Session Note  Patient Details  Name: Danee Soller MRN: [id number] Date of Birth: 1966-07-26  Today's Date: 10/10/2021 OT Individual Time: 6314-9702 OT Individual Time Calculation (min): 90 min    Short Term Goals: Week 1:  OT Short Term Goal 1 (Week 1): Pt will stand with max A of 1 in the stedy OT Short Term Goal 2 (Week 1): Pt will improve pain management <8/10 during ADLs OT Short Term Goal 3 (Week 1): Pt will complete LB dressing with max A of 1  Skilled Therapeutic Interventions/Progress Updates:  Skilled OT intervention completed with focus on toileting needs, bed mobility, knee immobilizer education, dynamic sitting balance and activity tolerance. Pt received upright in bed, agreeable to session stating she felt "soiled from tooting." Educated pt on how to donn new knee immobilizer (16 in) which fit much better than previous one, and therapist applied with total A for time management. Pt able to assist with long sitting rolling with use of both hands on R bed rail with pt able to clear L hip from bed with CGA only needed for supporting pt's L hip for comfort with use of pillow between knees also for comfort. With 1 person providing light min A to L hip for comfort, 2nd person completed pericare with total A. Pt with incontinent(?) loose BM in brief with nurse aware for charting. Educated pt on importance of calling for bed pan or toileting needs vs "tooting" and letting soiled brief occur and sit due to skin breakdown chances. Pt able to long sitting roll to L at same method with pillow in between legs however with supervision for rolling to clear R hip to manage brief. With heavy encouragement, pt agreeable to "take baby steps " and sit EOB first before attempting slideboard transfer. Pt able to advance BLEs with SLOW progression, and more "psychological" assist with "supporting" at knee and ankle to bring over to EOB with CGA. Pt required min A for scooting hips towards  EOB. While EOB, pt tolerated 20+ mins of dynamic sitting balance with distant supervision, for self-care and LB exercises/edema management, with R leg propped on pillow for support. Pt completed UB bathing/grooming/dressing with supervision. Pt then completed LLE 2x20 heel raises, 2x20 toe raises on BLE. Next OT expected shortly, with therapist encouraging pt to sit EOB until OT arrival to allow pressure relief and passive stretch of L thigh. Pt preferring this as well, and was left EOB with bed alarm on and all needs in reach at end of session.    Therapy Documentation Precautions:  Precautions Precautions: Fall Precaution Comments: L AVF, R IJ perm-cath ( will need new dialysis cath) Restrictions Weight Bearing Restrictions: No RLE Weight Bearing: Weight bearing as tolerated LLE Weight Bearing: Weight bearing as tolerated  Pain: Minimal pain, unrated during positional changes with bed mobility, improved with support/pillows   Therapy/Group: Individual Therapy  Sipriano Fendley E Dathan Attia 10/10/2021, 7:31 AM

## 2021-10-10 NOTE — Progress Notes (Addendum)
Occupational Therapy Session Note  Patient Details  Name: [name] MRN: [id number] Date of Birth: 01/28/1966  Today's Date: 10/10/2021 OT Individual Time: 1005-1045 OT Individual Time Calculation (min): 40 min    Short Term Goals: Week 1:  OT Short Term Goal 1 (Week 1): Pt will stand with max A of 1 in the stedy OT Short Term Goal 2 (Week 1): Pt will improve pain management <8/10 during ADLs OT Short Term Goal 3 (Week 1): Pt will complete LB dressing with max A of 1   Skilled Therapeutic Interventions/Progress Updates:    Pt sitting up at EOB just finishing with MD, reporting feeling significant pain in knee, however she just received medication to address pain, reporting she is comfortable sitting EOB right now, but apprehensive to complete any mobility presently and requesting "can I just get to know you right now?". Provided therapeutic use of self and educated pt regarding the benefits of deep breathing techniques and positive affirmations.  Pt reports she has used guided breathing audios before as well as using calming music to reduce anxiety and felt benefits from this and expressing interest into incorporating these techniques into future sessions to reduce anxiety/fear of falling. Pt then beginning to perseverate on having experienced a lot of gas today and "incontinence".  Assessed pt's reports further and per pt has intact sensation to bowel and bladder, has normal warning signs when needing to urinate.  Pt reports she has been constipated recently and was given medication to aide with this and does not have control over bowels.  Educated pt that this is most likely mild diarrhea and will resolve soon.  Pt tolerated sitting EOB without BUE support with distant supervision  x 40 minutes.  Direct hand of to PT.    Therapy Documentation Precautions:  Precautions Precautions: Fall Precaution Comments: L AVF, R IJ perm-cath ( will need new dialysis cath) Restrictions Weight Bearing  Restrictions: No RLE Weight Bearing: Weight bearing as tolerated LLE Weight Bearing: Weight bearing as tolerated    Therapy/Group: Individual Therapy  Ezekiel Slocumb 10/10/2021, 1:17 PM

## 2021-10-10 NOTE — IPOC Note (Signed)
Overall Plan of Care [facility]) Patient Details Name: [name] MRN: [id number] DOB: May 01, 1966  Admitting Diagnosis: Femur fracture [facility])  Hospital Problems: Principal Problem:   Femur fracture ([place])     Functional Problem List: Nursing Medication Management, Safety, Pain, Endurance, Bowel  PT Balance, Behavior, Edema, Endurance, Motor, Nutrition, Pain, Skin Integrity  OT Balance, Cognition, Endurance, Motor, Pain, Safety, Sensory  SLP    TR         Basic ADLs: OT Bathing, Dressing, Toileting     Advanced  ADLs: OT       Transfers: PT Bed Mobility, Bed to Chair, Car, Manufacturing systems engineer, Metallurgist: PT Ambulation, Emergency planning/management officer, Stairs     Additional Impairments: OT None  SLP        TR      Anticipated Outcomes Item Anticipated Outcome  Self Feeding no goal set  Swallowing      Basic self-care  (S)  Toileting  (S)   Bathroom Transfers (S)  Bowel/Bladder  Manage bowel w mod I assist  Transfers  supervision with LRAD  Locomotion  CGA with LRAD  Communication     Cognition     Pain  pain at or below level 4 with prn meds  Safety/Judgment  maintain safety w cues   Therapy Plan: PT Intensity: Minimum of 1-2 x/day ,45 to 90 minutes PT Frequency: 5 out of 7 days PT Duration Estimated Length of Stay: 3 weeks OT Intensity: Minimum of 1-2 x/day, 45 to 90 minutes OT Frequency: 5 out of 7 days OT Duration/Estimated Length of Stay: 3+ weeks     Due to the current state of emergency, patients may not be receiving their 3-hours of Medicare-mandated therapy.   Team Interventions: Nursing Interventions Disease Management/Prevention, Medication Management, Discharge Planning, Pain Management, Bowel Management, Patient/Family Education  PT interventions Ambulation/gait training, Discharge planning, Functional mobility training, Psychosocial support, Therapeutic Activities, Balance/vestibular training, Disease management/prevention,  Neuromuscular re-education, Skin care/wound management, Therapeutic Exercise, Wheelchair propulsion/positioning, Cognitive remediation/compensation, DME/adaptive equipment instruction, Pain management, Splinting/orthotics, UE/LE Strength taining/ROM, Community reintegration, Barrister's clerk education, IT trainer, UE/LE Coordination activities  OT Interventions Training and development officer, Engineer, drilling, Patient/family education, Therapeutic Activities, Psychosocial support, Therapeutic Exercise, Functional mobility training, Self Care/advanced ADL retraining, UE/LE Strength taining/ROM, Discharge planning, Pain management, UE/LE Coordination activities  SLP Interventions    TR Interventions    SW/CM Interventions Discharge Planning, Psychosocial Support, Patient/Family Education   Barriers to Discharge MD  Medical stability  Nursing Decreased caregiver support, Hemodialysis 1 level 5 ste  w spouse; Home HD M-W -F  PT Inaccessible home environment, Decreased caregiver support, Home environment access/layout, Wound Care, Hemodialysis, Other (comments) pain, 5 STE with 2 rails (using crutches previously), husband can only provide intermittent assist, anxiety, weakness.  OT Decreased caregiver support    SLP      SW Hemodialysis, Decreased caregiver support, Lack of/limited family support Husband works. Patient drives self to/from dialysis. Plans for patient to transition to periotoneal dialysis when able too but still in home training.   Team Discharge Planning: Destination: PT-Home ,OT- Home , SLP-  Projected Follow-up: PT-Home health PT, OT-  Home health OT, SLP-  Projected Equipment Needs: PT-To be determined, OT- To be determined, SLP-  Equipment Details: PT-has rollator and crutches, OT-  Patient/family involved in discharge planning: PT- Patient, Family member/caregiver,  OT-Patient, Family member/caregiver, SLP-   MD ELOS: 23 days Medical Rehab Prognosis:   Good Assessment: [name] is a 56 year old  woman admitted to CIR with a left femur fracture. Course complicated by large right quadriceps tear necessitating surgery. Medications are being managed, and labs and vitals are being monitored regularly.      See Team Conference Notes for weekly updates to the plan of care

## 2021-10-10 NOTE — Progress Notes (Signed)
Physical Therapy Session Note  Patient Details  Name: [name] MRN: [id number] Date of Birth: 10/15/1965  Today's Date: 10/10/2021 PT Individual Time: 1093-2355 PT Individual Time Calculation (min): 70 min   Short Term Goals: Week 1:  PT Short Term Goal 1 (Week 1): Pt will transfer sit<>stand with LRAD and max A of 1 PT Short Term Goal 2 (Week 1): Pt will transfer bed<>chair with LRAD and max A of 1 PT Short Term Goal 3 (Week 1): Pt will initate gait training  Skilled Therapeutic Interventions/Progress Updates:   Received pt sitting EOB with knee immobilizer on and OT present. Discussion had with OT and pt regarding strategies to reduce anxiety including using essential oils and various breathing techniques and implementing those into therapy sessions, specifically with mobility. Pt agreeable to PT treatment and reported pain 8/10 in R knee (premedicated). Discussed increased knee pain most likely due to pt sitting EOB for >45 minutes with leg in dependent position and resting in hyperextension - educated pt on avoiding spending prolonged periods of time in this position. Pt transferred sit<>supine with max A for BLE management to rest while discussing plan for session. Encouraged transferring into WC, however pt hesitant due to frequent loose stool episodes from this morning - notified RN who reports MD stopped all contributing medications. Provided pt with elevating legrests for WC. Pt rolled L/R with max A and use of bedrails to check to ensure brief was clean. Pt transferred semi-reclined<>long sitting<>sitting EOB with max A of 1 for BLE management (+2 on standby). Pt moved very slow and extremely particular about where therapist placed hands for stability. Of note, pt does NOT like to use logroll technique for bed mobility and has her own way of sitting up. Once sitting EOB discussed in great depth and detail technique for slideboard transfer. Pt weight shifted to R and allowed rehab tech to  place slideboard underneath L thigh. Attempted mini-scoots on the board (with chuck pad), however pt only able to take 2-3 scoots prior to stopping due to increased pain. Encouraged pt to continue into [facility], however pt refused insisting on "stopping here for the day" and reported feeling "happy with the progress made today". Pt unable to scoot to [facility], therefore removed footboard and transferred sit<>supine with max A for BLE management, again with significantly increased time. Pt unable to scoot to [facility], therefore placed bed in Trendelenburg position and initially provided max A to scoot up in bed. Once pt was able to reach bedrails, was able to pull herself to [facility] with CGA. Pt required extensive rest break, then removed knee immobilizer (per pt request) and performed ankle pumps 2x25. Placed pillows underneath L hip to position hip/knee in midline and prevent excessive ER. Concluded session with pt semi-reclined in bed, needs within reach, and bed alarm on.   Therapy Documentation Precautions:  Precautions Precautions: Fall Precaution Comments: L AVF, R IJ perm-cath ( will need new dialysis cath) Restrictions Weight Bearing Restrictions: No RLE Weight Bearing: Weight bearing as tolerated LLE Weight Bearing: Weight bearing as tolerated  Therapy/Group: Individual Therapy Alfonse Alpers PT, DPT   10/10/2021, 7:25 AM

## 2021-10-10 NOTE — Progress Notes (Signed)
Patient ID: [name], female   DOB: Jan 17, 1966, 56 y.o.   MRN: [id number]  Patient d/c date changed to 2/28  due to HD.

## 2021-10-10 NOTE — Progress Notes (Signed)
Pt's Dexcom sugar monitor reading is 174.  Will administer 1 unit of insulin as per sliding scale.

## 2021-10-10 NOTE — Progress Notes (Signed)
PROGRESS NOTE   Subjective/Complaints: Discussed plan for surgery Tuesday- patient is disappointed but accepting of this Incontinent of bowel- d/ced stool softeners or made prn. Advised to let us know if she goes 1 day without BM  ROS: +hip pain, +right knee pain, +stool incontinence  Objective:   MR KNEE RIGHT WO CONTRAST  Result Date: 10/09/2021 CLINICAL DATA:  Right knee pain after fall.  Abnormal x-ray EXAM: MRI OF THE RIGHT KNEE WITHOUT CONTRAST TECHNIQUE: Multiplanar, multisequence MR imaging of the knee was performed. No intravenous contrast was administered. COMPARISON:  X-ray 10/07/2021 FINDINGS: Technical Note: Despite efforts by the technologist and patient, motion artifact is present on today's exam and could not be eliminated. This reduces exam sensitivity and specificity. MENISCI Medial meniscus:  Intact. Lateral meniscus:  Intact. LIGAMENTS Cruciates: Intact ACL and PCL. Collaterals: Intact MCL. Lateral collateral ligament complex intact. CARTILAGE Patellofemoral: Irregular full-thickness cartilage loss involving the superior aspect of the lateral and medial patellar facets, which may be degenerative or posttraumatic. High-grade near-full thickness cartilage defect within the central trochlear groove measuring approximately 11 x 5 mm. Medial: Mild diffuse chondral thinning of the weight-bearing medial compartment. Lateral:  No chondral defect. MISCELLANEOUS Joint: Large complex knee joint effusion. Thickened, edematous appearance of the suprapatellar fat pad. Popliteal Fossa:  No Baker's cyst. Intact popliteus tendon. Extensor Mechanism: High-grade, near full-thickness tear of the distal quadriceps tendon with less than 1 cm of retraction. The superficial most aspect of the distal tendon remains intact. Low signal cortical avulsion fragments are also avulsed from the superior cortex of the patella (series 12, image 17). Laxity of the  patellar tendon without tear. Bones: Cortical avulsion of the superior pole of the patella. Patella baja alignment. Patchy marrow edema within the distal femur and proximal tibia are nonspecific and could be posttraumatic or related to osteopenia. Previously described area of relative decreased density in the proximal tibial metaphysis is consistent with fatty bone marrow. Similar findings are seen within the distal femoral metaphysis (series 9, image 17). No suspicious marrow replacing bone lesion. Other: Diffuse soft tissue swelling and edema with small amount of ill-defined hemorrhage along the anterolateral aspect of the knee. IMPRESSION: 1. High-grade, near full-thickness mildly retracted tear of the distal quadriceps tendon with associated cortical avulsion of the superior pole of the patella. 2. Large complex knee joint effusion. 3. Patchy marrow edema within the distal femur and proximal tibia are nonspecific and could be posttraumatic or related to osteopenia. 4. Previously described area of decreased density in the proximal tibial metaphysis is consistent with fatty bone marrow. No suspicious marrow replacing bone lesion. 5. 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 6. Intact menisci.  Intact cruciate and collateral ligaments. Electronically Signed   By: Davina Poke D.O.   On: 10/09/2021 08:18   No results for input(s): WBC, HGB, HCT, PLT in the last 72 hours.  No results for input(s): NA, K, CL, CO2, GLUCOSE, BUN, CREATININE, CALCIUM in the last 72 hours.   Intake/Output Summary (Last 24 hours) at 10/10/2021 1333 Last data filed at 10/10/2021 0757 Gross per 24 hour  Intake 237 ml  Output --  Net 237  ml        Physical Exam: Vital Signs Blood pressure (!) 145/67, pulse 91, temperature 97.8 F (36.6 C), temperature source Axillary, resp. rate 16, height 5\' 7"  (1.702 m), weight 81.4 kg, SpO2 99 %. Gen: no  distress, normal appearing, BMI 29.11 HEENT: oral mucosa pink and moist, NCAT Cardio: Reg rate Chest: normal effort, normal rate of breathing Abd: soft, non-distended Ext: no edema Musculoskeletal:        General: Swelling and tenderness present.     Cervical back: Normal range of motion and neck supple.     Right lower leg: Edema present.     Left lower leg: Edema present.     Comments: Left hip tender to palpation and PROM with associated swelling. Left knee with mild effusion, patella laxity. Right hip tender with leg raise  Right knee tender and swollen Skin:    Comments: Incision site dressing clean dry and intact.   Neurological:     Comments: Patient is alert.  No acute distress.  Oriented x3 and follows commands. Alert and oriented x 3. Normal insight and awareness. Intact Memory. Normal language and speech. Cranial nerve exam unremarkable. UE motor 5/5. LE motor limited proximally by pain. ADF/PF 4-5/5. No sensory findings.   Psychiatric:     Comments: Pleasant, slightly anxious  GU: stool incontinence in diaper   Assessment/Plan: 1. Functional deficits which require 3+ hours per day of interdisciplinary therapy in a comprehensive inpatient rehab setting. Physiatrist is providing close team supervision and 24 hour management of active medical problems listed below. Physiatrist and rehab team continue to assess barriers to discharge/monitor patient progress toward functional and medical goals  Care Tool:  Bathing  Bathing activity did not occur: Safety/medical concerns Body parts bathed by patient: Right arm, Left arm, Chest, Abdomen, Face         Bathing assist Assist Level: Supervision/Verbal cueing (EOB)     Upper Body Dressing/Undressing Upper body dressing   What is the patient wearing?: Pull over shirt    Upper body assist Assist Level: Supervision/Verbal cueing (EOB)    Lower Body Dressing/Undressing Lower body dressing      What is the patient  wearing?: Incontinence brief     Lower body assist Assist for lower body dressing: Maximal Assistance - Patient 25 - 49%     Toileting Toileting    Toileting assist Assist for toileting: Maximal Assistance - Patient 25 - 49%     Transfers Chair/bed transfer  Transfers assist     Chair/bed transfer assist level: 2 Helpers     Locomotion Ambulation   Ambulation assist   Ambulation activity did not occur: Safety/medical concerns (weakness, fatigue, pain, anxiety)          Walk 10 feet activity   Assist  Walk 10 feet activity did not occur: Safety/medical concerns (weakness, fatigue, pain, anxiety)        Walk 50 feet activity   Assist Walk 50 feet with 2 turns activity did not occur: Safety/medical concerns (weakness, fatigue, pain, anxiety)         Walk 150 feet activity   Assist Walk 150 feet activity did not occur: Safety/medical concerns (weakness, fatigue, pain, anxiety)         Walk 10 feet on uneven surface  activity   Assist Walk 10 feet on uneven surfaces activity did not occur: Safety/medical concerns (weakness, fatigue, pain, anxiety)         Wheelchair     Assist Is  the patient using a wheelchair?: Yes Type of Wheelchair: Manual Wheelchair activity did not occur: Safety/medical concerns (weakness, fatigue, pain, anxiety)         Wheelchair 50 feet with 2 turns activity    Assist    Wheelchair 50 feet with 2 turns activity did not occur: Safety/medical concerns (weakness, fatigue, pain, anxiety)       Wheelchair 150 feet activity     Assist  Wheelchair 150 feet activity did not occur: Safety/medical concerns (weakness, fatigue, pain, anxiety)       Blood pressure (!) 145/67, pulse 91, temperature 97.8 F (36.6 C), temperature source Axillary, resp. rate 16, height 5\' 7"  (1.702 m), weight 81.4 kg, SpO2 99 %.    Medical Problem List and Plan: 1. Functional deficits secondary to nondisplaced fracture  right inferior pubic ramus as well as a displaced foreshortened fracture through the left femoral neck/intertrochanteric area.  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   -Weightbearing as tolerated with anterior total hip precautions.                                  -Conservative care right inferior pubic ramus fracture, weightbearing as tolerated             -patient may  shower             -ELOS/Goals: 10-14 days, supervision to min assist goals  Continue CIR  -HFU scheduled 2.  Antithrombotics: -DVT/anticoagulation:  Mechanical: Antiembolism stockings, thigh (TED hose) Bilateral lower extremities check vascular study.   -should be able to begin lovenox today as permacath placed            -antiplatelet therapy: N/A 3. Femur fracture pain: continue Flector patch,Robaxin 750 mg 4 times daily, hydrocodone and oxycodone as needed             -pt having significant spasms in left thigh.             -add kpad to help with spasms  -schedule tylenol 1000mg  TID 4. Anxiety: Instructed on deep breathing techniques. Provide emotional support.  Melatonin as needed             -antipsychotic agents: N/A 5. Neuropsych: This patient is capable of making decisions on her own behalf. 6. Skin/Wound Care: Routine skin checks 7. Fluids/Electrolytes/Nutrition: Routine in and outs with follow-up chemistries 8.  End-stage renal disease/hemodialysis.  Permacath exchanged 10/03/2021.    -  Follow-up hemodialysis per Dr. [name] -HD later in day to allow participation in therapies during the day 9.  Acute on chronic anemia.  Has had transfusions already x 2. Hgb has stabilized in 7 range..  Continue iron supplement.              -epo per nephrology 10.  Diabetes mellitus with peripheral neuropathy. Placed order that she may use her Dexcom 6. Hemoglobin A1c 6.2.  SSI as prior to admission. Discuss Qutenza.  11.  Hypertension.  ContinueCozaar 50 mg daily, 12.  Hyperparathyroidism of renal origin.  Plan  outpatient parathyroidectomy per Dr.Lateef 13.  Overweight.  BMI 29.11.  Dietary follow-up 14. Cushingoid?--outpt work up 29. Constipation: resolved. Decrease colace to daily prn. D/c senna.  16. Right quadriceps tear: plan for surgery Tuesday. Precautions as per ortho.  17. Gas: add simethicone  LOS: 4 days A FACE TO FACE EVALUATION WAS PERFORMED  Martha Clan P Antanette Richwine 10/10/2021, 1:33 PM

## 2021-10-11 ENCOUNTER — Inpatient Hospital Stay (HOSPITAL_COMMUNITY): Payer: Medicare Other

## 2021-10-11 LAB — OCCULT BLOOD X 1 CARD TO LAB, STOOL
Fecal Occult Bld: POSITIVE — AB
Fecal Occult Bld: POSITIVE — AB

## 2021-10-11 IMAGING — DX DG ABDOMEN 1V
1 series · 1 of 1 positions shown · non-contrast
Comparison: [DATE]

CLINICAL DATA: Abdominal spasm. Black tarry stools and constipation
after hip surgery. Evaluate for ileus or constipation.

EXAM:
ABDOMEN - 1 VIEW

[abdomen kub]
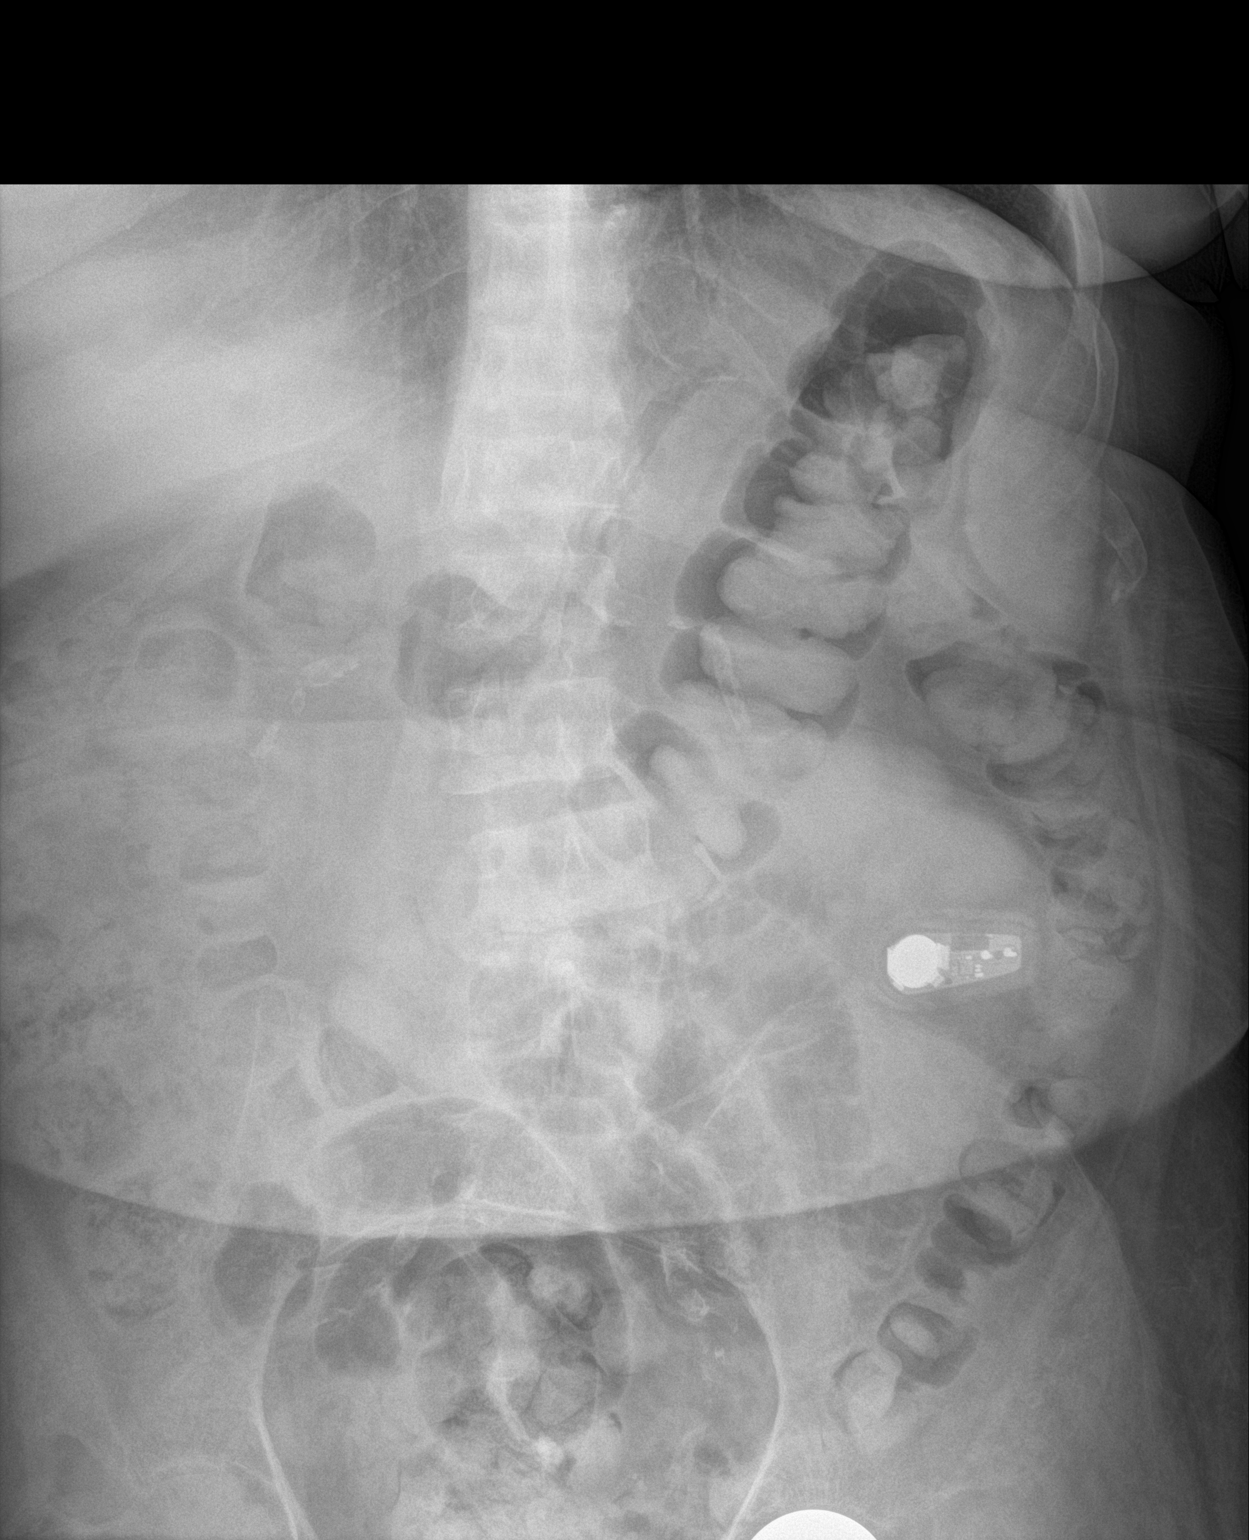

[1 of 1 positions shown; findings below may reference images not displayed]

FINDINGS: There is mild gaseous distension of the large and small bowel loops.
A moderate stool burden is also identified within the colon. No
signs of bowel obstruction.
IMPRESSION: Mild gaseous distension of the large and small bowel loops, likely
reflecting mild ileus.

## 2021-10-11 MED ORDER — SORBITOL 70 % SOLN
30.0000 mL | Freq: Once | Status: AC
  Administered 2021-10-11: 30 mL via ORAL
  Filled 2021-10-11: qty 30

## 2021-10-11 MED ORDER — BELLADONNA ALKALOIDS-OPIUM 16.2-60 MG RE SUPP
1.0000 | Freq: Four times a day (QID) | RECTAL | Status: DC | PRN
  Administered 2021-10-11: 1 via RECTAL
  Filled 2021-10-11: qty 1

## 2021-10-11 NOTE — Progress Notes (Signed)
Occupational Therapy Session Note  Patient Details  Name: [name] MRN: [id number] Date of Birth: July 15, 1966  Today's Date: 10/11/2021 OT Individual Time: 1000-1109 OT Individual Time Calculation (min): 69 min    Short Term Goals: Week 1:  OT Short Term Goal 1 (Week 1): Pt will stand with max A of 1 in the stedy OT Short Term Goal 2 (Week 1): Pt will improve pain management <8/10 during ADLs OT Short Term Goal 3 (Week 1): Pt will complete LB dressing with max A of 1  Skilled Therapeutic Interventions/Progress Updates:  Skilled OT intervention completed with focus on repositioning, BUE strengthening, pain management. Pt received supine in bed, complaining that her L hip was hurting her, premedicated. Pt's leg found to be externally rotated/abducted with min A required to place back in neutral, and min A for pillow positioning to maintain LLE in neutral and to off load L hip for pressure relief. Therapist encouraged OOB tasks, with pt stating "I have been bearing down all night, I didn't sleep, and I'm not moving from this bed." Note- pt making loud moaning noises- almost contraction-like moans anytime asked about the "bearing down" pain, which was also witnessed with nursing in room at different time as well. Not sure on how "random" these are occurring. Therapist offered bed level self-care, as well as toileting/brief hygiene however pt declined. With max encouragement pt agreeable to bed level exercises to promote overall endurance needed for functional tasks. Therapist utilized pt's preference of music (classical) for increased participation, and to decrease pain/anxiety. Pt completed the following:  2x20 RLE internal rotations, declined doing on the LLE  With orange theraband and bed rail as anchor- Chest presses 2x15 each arm Elbow extension 2x15 each arm Internal/external rotation 2x15 each arm Shoulder extension 2x15 each arm  Pt required cues for form and technique throughout. As  therapist was going to exit room, nursing entered asking for assist with pt's bed mobility for him to place suppository. Therapist provided min assist for lifting pt's R hip and trunk from bed with pt using bed rail to hold self up for nurse to place meds. Pt left upright in bed, with bed alarm on, pillow repositioned for neutral LLE, and discussed with pt/nurse about perhaps ordering PRAFO to promote proper positioning at night. Will discuss with primary PT. Provided a nepro per pt request/nursing clearance, and all needs in reach at end of session.   Therapy Documentation Precautions:  Precautions Precautions: Fall Precaution Comments: L AVF, R IJ perm-cath ( will need new dialysis cath) Required Braces or Orthoses: Knee Immobilizer - Right Knee Immobilizer - Right: On at all times Restrictions Weight Bearing Restrictions: No RLE Weight Bearing: Weight bearing as tolerated LLE Weight Bearing: Weight bearing as tolerated  Pain: Unrated pain in abdomen/bowels, with nursing aware and pt premedicated   Therapy/Group: Individual Therapy  [name] 10/11/2021, 7:47 AM

## 2021-10-11 NOTE — Progress Notes (Signed)
Physical Therapy Session Note  Patient Details  Name: [name] MRN: [id number] Date of Birth: Jul 09, 1966  Today's Date: 10/11/2021 PT Missed Time: 82 Minutes Missed Time Reason: Patient fatigue  Pt received supine in bed resting with the lights off. Pt with minimal yes/no mumbled responses to therapist; therefore, with questioning able to determine that pt recently had an enema and is reporting fatigue and declining participation in therapy at this time. Nurse notified and present to encourage patient to participate and pt continues to decline today. Pt encouraged to participate in next scheduled therapy session. Missed 30 minutes of skilled physical therapy.  Tawana Scale , PT, DPT, NCS, CSRS  10/11/2021, 3:32 PM

## 2021-10-11 NOTE — Progress Notes (Signed)
Physical Therapy Session Note  Patient Details  Name: [name] MRN: [id number] Date of Birth: Jun 22, 1966  Today's Date: 10/11/2021 PT Individual Time: 1301-1350 PT Individual Time Calculation (min): 49 min  Today's Date: 10/11/2021 PT Missed Time: 11 minutes PT Missed Time Reason: Nursing Care (disimpaction)  Short Term Goals: Week 1:  PT Short Term Goal 1 (Week 1): Pt will transfer sit<>stand with LRAD and max A of 1 PT Short Term Goal 2 (Week 1): Pt will transfer bed<>chair with LRAD and max A of 1 PT Short Term Goal 3 (Week 1): Pt will initate gait training  Skilled Therapeutic Interventions/Progress Updates:   Received pt semi-reclined in bed whining and moaning in discomfort from being impacted. Pt reported receiving suppository earlier today and took laxatives at 12:30pm. Pt reported pain 10/10 in stomach and urge to "push" but reported pain in bilateral LEs has improved. Encouraged getting vertical for improved pelvic alignment to facilitate BM. Pt transferred semi-reclined<>sitting upright in bed and paused in this position for ~5 minutes and pt reporting temporary relief, but shortly after continued moaning and grimacing in pain - encouraged deep breathing, however pt too anxious/emotional to focus. RN alerted and provided Miralax mixed with prune juice. Educated pt on importance of getting OOB and sitting upright, however pt became loud and tearful raising voice stating "I don't wanna move right now". Encouraged pt to eat lunch, as RN reported pt had nothing on her stomach - pt had a few sips of soup. Discussed pt's current limitations, pain, and unwilling to attempt OOB mobility and suggested altering therapy schedule to 15/7. Pt requested to wait until Monday to decide stating "If they can just get this all out of me, I know I can do more" - will discuss with treatment team on Monday. RN arrived requesting therapist's assistance with disimpaction. Pt rolled to L with mod A and  therapist managed LE's and assisted with comfort while RN began disimpaction. 2nd RN arrived to assist and pt left in care of both RNs.11 minutes missed of skilled physical therapy.   Therapy Documentation Precautions:  Precautions Precautions: Fall Precaution Comments: L AVF, R IJ perm-cath ( will need new dialysis cath) Restrictions Weight Bearing Restrictions: No RLE Weight Bearing: Weight bearing as tolerated LLE Weight Bearing: Weight bearing as tolerated  Therapy/Group: Individual Therapy Alfonse Alpers PT, DPT   10/11/2021, 7:34 AM

## 2021-10-11 NOTE — Progress Notes (Signed)
PROGRESS NOTE   Subjective/Complaints:   Pt c/o horrible abd spasms that make her bear down to have BM-   Pretty intense- occurring every 1015 minutes since 3pm yesterday- Also had stool this AM- black tarry stool- and feels full of stool.    ROS:  Pt denies SOB, abd pain, CP, N/V/ (+)C/D, and vision changes   Objective:   DG Abd 1 View  Result Date: 10/11/2021 CLINICAL DATA:  Abdominal spasm. Black tarry stools and constipation after hip surgery. Evaluate for ileus or constipation. EXAM: ABDOMEN - 1 VIEW COMPARISON:  06/09/2019 FINDINGS: There is mild gaseous distension of the large and small bowel loops. A moderate stool burden is also identified within the colon. No signs of bowel obstruction. IMPRESSION: Mild gaseous distension of the large and small bowel loops, likely reflecting mild ileus. Electronically Signed   By: Kerby Moors M.D.   On: 10/11/2021 11:02   Recent Labs    10/10/21 1459  WBC 11.5*  HGB 8.1*  HCT 24.4*  PLT 266    Recent Labs    10/10/21 1459  NA 134*  K 4.4  CL 94*  CO2 26  GLUCOSE 206*  BUN 51*  CREATININE 7.16*  CALCIUM 8.7*     Intake/Output Summary (Last 24 hours) at 10/11/2021 1404 Last data filed at 10/10/2021 1754 Gross per 24 hour  Intake --  Output 2004 ml  Net -2004 ml        Physical Exam: Vital Signs Blood pressure (!) 161/71, pulse 100, temperature 97.9 F (36.6 C), resp. rate 17, height 5\' 7"  (1.702 m), weight 78.6 kg, SpO2 100 %.    General: awake, alert, appropriate, NAD HENT: conjugate gaze; oropharynx moist CV: tachhyardic rate; no JVD Pulmonary: CTA B/L; no W/R/R- good air movement GI:  NT, ND, (+)BS- firmish, hypoactive BS Psychiatric: appropriate Neurological: Ox3 Musculoskeletal:        General: Swelling and tenderness present.     Cervical back: Normal range of motion and neck supple.     Right lower leg: Edema present.     Left lower leg:  Edema present.     Comments: Left hip tender to palpation and PROM with associated swelling. Left knee with mild effusion, patella laxity. Right hip tender with leg raise  Right knee tender and swollen Skin:    Comments: Incision site dressing clean dry and intact.   Neurological:     Comments: Patient is alert.  No acute distress.  Oriented x3 and follows commands. Alert and oriented x 3. Normal insight and awareness. Intact Memory. Normal language and speech. Cranial nerve exam unremarkable. UE motor 5/5. LE motor limited proximally by pain. ADF/PF 4-5/5. No sensory findings.   Psychiatric:     Comments: Pleasant, slightly anxious  GU: stool incontinence in diaper   Assessment/Plan: 1. Functional deficits which require 3+ hours per day of interdisciplinary therapy in a comprehensive inpatient rehab setting. Physiatrist is providing close team supervision and 24 hour management of active medical problems listed below. Physiatrist and rehab team continue to assess barriers to discharge/monitor patient progress toward functional and medical goals  Care Tool:  Bathing  Bathing activity did not occur:  Safety/medical concerns Body parts bathed by patient: Right arm, Left arm, Chest, Abdomen, Face         Bathing assist Assist Level: Supervision/Verbal cueing (EOB)     Upper Body Dressing/Undressing Upper body dressing   What is the patient wearing?: Pull over shirt    Upper body assist Assist Level: Supervision/Verbal cueing (EOB)    Lower Body Dressing/Undressing Lower body dressing      What is the patient wearing?: Incontinence brief     Lower body assist Assist for lower body dressing: Maximal Assistance - Patient 25 - 49%     Toileting Toileting    Toileting assist Assist for toileting: Maximal Assistance - Patient 25 - 49%     Transfers Chair/bed transfer  Transfers assist     Chair/bed transfer assist level: 2 Helpers      Locomotion Ambulation   Ambulation assist   Ambulation activity did not occur: Safety/medical concerns (weakness, fatigue, pain, anxiety)          Walk 10 feet activity   Assist  Walk 10 feet activity did not occur: Safety/medical concerns (weakness, fatigue, pain, anxiety)        Walk 50 feet activity   Assist Walk 50 feet with 2 turns activity did not occur: Safety/medical concerns (weakness, fatigue, pain, anxiety)         Walk 150 feet activity   Assist Walk 150 feet activity did not occur: Safety/medical concerns (weakness, fatigue, pain, anxiety)         Walk 10 feet on uneven surface  activity   Assist Walk 10 feet on uneven surfaces activity did not occur: Safety/medical concerns (weakness, fatigue, pain, anxiety)         Wheelchair     Assist Is the patient using a wheelchair?: Yes Type of Wheelchair: Manual Wheelchair activity did not occur: Safety/medical concerns (weakness, fatigue, pain, anxiety)         Wheelchair 50 feet with 2 turns activity    Assist    Wheelchair 50 feet with 2 turns activity did not occur: Safety/medical concerns (weakness, fatigue, pain, anxiety)       Wheelchair 150 feet activity     Assist  Wheelchair 150 feet activity did not occur: Safety/medical concerns (weakness, fatigue, pain, anxiety)       Blood pressure (!) 161/71, pulse 100, temperature 97.9 F (36.6 C), resp. rate 17, height 5\' 7"  (1.702 m), weight 78.6 kg, SpO2 100 %.    Medical Problem List and Plan: 1. Functional deficits secondary to nondisplaced fracture right inferior pubic ramus as well as a displaced foreshortened fracture through the left femoral neck/intertrochanteric area.  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   -Weightbearing as tolerated with anterior total hip precautions.                                  -Conservative care right inferior pubic ramus fracture, weightbearing as tolerated              -patient may  shower             -ELOS/Goals: 10-14 days, supervision to min assist goals  Con't CIR_   -HFU scheduled 2.  Antithrombotics: -DVT/anticoagulation:  Mechanical: Antiembolism stockings, thigh (TED hose) Bilateral lower extremities check vascular study.   -should be able to begin lovenox today as permacath placed            -antiplatelet therapy:  N/A 3. Femur fracture pain: continue Flector patch,Robaxin 750 mg 4 times daily, hydrocodone and oxycodone as needed             -pt having significant spasms in left thigh.             -add kpad to help with spasms  -schedule tylenol 1000mg  TID 4. Anxiety: Instructed on deep breathing techniques. Provide emotional support.  Melatonin as needed             -antipsychotic agents: N/A 5. Neuropsych: This patient is capable of making decisions on her own behalf. 6. Skin/Wound Care: Routine skin checks 7. Fluids/Electrolytes/Nutrition: Routine in and outs with follow-up chemistries 8.  End-stage renal disease/hemodialysis.  Permacath exchanged 10/03/2021.    -  Follow-up hemodialysis per Dr. [name] -HD later in day to allow participation in therapies during the day 9.  Acute on chronic anemia.  Has had transfusions already x 2. Hgb has stabilized in 7 range..  Continue iron supplement.              -epo per nephrology 10.  Diabetes mellitus with peripheral neuropathy. Placed order that she may use her Dexcom 6. Hemoglobin A1c 6.2.  SSI as prior to admission. Discuss Qutenza.  11.  Hypertension.  ContinueCozaar 50 mg daily, 12.  Hyperparathyroidism of renal origin.  Plan outpatient parathyroidectomy per Dr.Lateef 13.  Overweight.  BMI 29.11.  Dietary follow-up 14. Cushingoid?--outpt work up 25. Constipation: resolved. Decrease colace to daily prn. D/c senna.  16. Right quadriceps tear: plan for surgery Tuesday. Precautions as per ortho.  17. Gas: add simethicone 18. Ileus  2/11- d'x'd by KUB- will give liquid diet- Sorbitol and soap  suds enema- to get cleaned out. -no NGT needed quite yet.  19. Abd spasms  2/11- B&O suppository for pain of spasms 20. Black tarry stools  2/11- (+) hemoccult- will recheck labs in AM for possible GI bleed- look also at BUN  I spent a total of  50  minutes on total care today- >50% coordination of care- due to d/w nursing 4x about pt and KUB reading independently- -also    LOS: 5 days A FACE TO FACE EVALUATION WAS PERFORMED  Jeanette Moffatt 10/11/2021, 2:04 PM

## 2021-10-11 NOTE — Progress Notes (Signed)
°  [facility] Progress Note   Subjective: Seen in room. No issues with dialysis yesterday Net UF 2L. No complaints today   Objective Vitals:   10/10/21 1818 10/10/21 1848 10/10/21 1956 10/11/21 0451  BP: 137/60 136/63 (!) 152/77 (!) 161/71  Pulse: (!) 101 (!) 103 (!) 107 100  Resp: 19 17 18 17   Temp: 98.9 F (37.2 C) 98.6 F (37 C) 98.2 F (36.8 C) 97.9 F (36.6 C)  TempSrc: Oral     SpO2: 100% 96% 98% 100%  Weight: 78.6 kg     Height:         Additional Objective Labs: Basic Metabolic Panel: Recent Labs  Lab 10/06/21 0445 10/10/21 1459  NA 131* 134*  K 5.1 4.4  CL 94* 94*  CO2 24 26  GLUCOSE 172* 206*  BUN 71* 51*  CREATININE 9.02* 7.16*  CALCIUM 9.1 8.7*  PHOS 5.9* 4.3    CBC: Recent Labs  Lab 10/06/21 0445 10/10/21 1459  WBC 6.3 11.5*  NEUTROABS 5.0  --   HGB 7.7* 8.1*  HCT 23.5* 24.4*  MCV 95.5 97.6  PLT 188 266    Blood Culture No results found for: SDES, SPECREQUEST, CULT, REPTSTATUS   Physical Exam General: Well appearing, nad  Heart: RRR No m,r,g  Lungs: Clear bilaterally  Abdomen: soft non-tender  Extremities: Trace LE edema  Dialysis Access: R IJ TDC   Medications:   acetaminophen  1,000 mg Oral TID   vitamin C  500 mg Oral Daily   Chlorhexidine Gluconate Cloth  6 each Topical BID   Chlorhexidine Gluconate Cloth  6 each Topical Q0600   darbepoetin (ARANESP) injection - DIALYSIS  40 mcg Intravenous Q Fri-HD   diclofenac  1 patch Transdermal BID   diclofenac Sodium  2 g Topical QID   enoxaparin (LOVENOX) injection  30 mg Subcutaneous Q24H   feeding supplement (NEPRO CARB STEADY)  237 mL Oral BID BM   ferric citrate  420 mg Oral TID WC   insulin aspart  0-6 Units Subcutaneous TID WC   losartan  50 mg Oral Daily   methocarbamol  750 mg Oral QID   multivitamin  1 tablet Oral QHS   Vitamin D (Ergocalciferol)  50,000 Units Oral Q7 days    OP HD:  MWF [facility] [address] [phone])  3h 52min  81kg   Hep 3000 then  800u/hr  RIJ TDC       Assessment/ Plan: Debility - rehab per CIR Tendon rupture - RLE. Seen by orthopedics. For surgery Tuesday.  L hip fracture/ R pelvic ramus fx - sp L hemiarthroplasty on 09/29/21 at [name] - on HD mwf. Continue on schedule. Next HD 2/13.  BP/ volume - at dry wt, cont losartan. Below dry weight. Post HD wt 78.6 kg  on 2/10  DM2 - per pmd Anemia ckd - Hb 7- 8 range. S/p transfusion. Will start ESA here - Aranesp 40 on 2/10.  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

## 2021-10-11 NOTE — Progress Notes (Signed)
Physical Therapy Session Note  Patient Details  Name: [name] MRN: [id number] Date of Birth: 06-23-1966  Today's Date: 10/11/2021 PT Individual Time: 0902-0927 PT Individual Time Calculation (min): 25 min   Short Term Goals: Week 1:  PT Short Term Goal 1 (Week 1): Pt will transfer sit<>stand with LRAD and max A of 1 PT Short Term Goal 2 (Week 1): Pt will transfer bed<>chair with LRAD and max A of 1 PT Short Term Goal 3 (Week 1): Pt will initate gait training  Skilled Therapeutic Interventions/Progress Updates:    Patient received reclined in bed, stating that she's "not doing well at all." She reports that she didn't sleep last night because every 10-15 mins she would bear down as if she were trying to have a bowel movement. Patient very distressed by this. Patient agreeable to PT assist to clean up as she reports that her brief is soiled. Prior to being able to roll, patient with "episode" of bearing down in which she became very red in the face and would grimace in pain and moan for ~15s. Patient rolling L with use of bed rails and CGA. PT observing black tar stool in brief. TotalA for posterior perihygiene- anterior with MInA. Patient remains very resistant to movement and very anxious. PT placing new brief. MD in/out for assessment and made aware of black tar stool- brief left out for assessment of blood. Patient remaining in bed, bed alarm on, call light within reach, MD and RN at bedside.   Therapy Documentation Precautions:  Precautions Precautions: Fall Precaution Comments: L AVF, R IJ perm-cath ( will need new dialysis cath) Required Braces or Orthoses: Knee Immobilizer - Right Knee Immobilizer - Right: On at all times Restrictions Weight Bearing Restrictions: No RLE Weight Bearing: Weight bearing as tolerated LLE Weight Bearing: Weight bearing as tolerated     Therapy/Group: Individual Therapy  Karoline Caldwell, PT, DPT, CBIS  10/11/2021, 7:52 AM

## 2021-10-11 NOTE — Progress Notes (Signed)
Pt's Dexcom sugar monitor reading 238.

## 2021-10-12 ENCOUNTER — Inpatient Hospital Stay (HOSPITAL_COMMUNITY): Payer: Medicare Other

## 2021-10-12 LAB — CBC WITH DIFFERENTIAL/PLATELET
Abs Immature Granulocytes: 0.1 10*3/uL — ABNORMAL HIGH (ref 0.00–0.07)
Basophils Absolute: 0.1 10*3/uL (ref 0.0–0.1)
Basophils Relative: 1 %
Eosinophils Absolute: 0.3 10*3/uL (ref 0.0–0.5)
Eosinophils Relative: 3 %
HCT: 26.3 % — ABNORMAL LOW (ref 36.0–46.0)
Hemoglobin: 8.3 g/dL — ABNORMAL LOW (ref 12.0–15.0)
Immature Granulocytes: 1 %
Lymphocytes Relative: 9 %
Lymphs Abs: 0.8 10*3/uL (ref 0.7–4.0)
MCH: 31.2 pg (ref 26.0–34.0)
MCHC: 31.6 g/dL (ref 30.0–36.0)
MCV: 98.9 fL (ref 80.0–100.0)
Monocytes Absolute: 0.6 10*3/uL (ref 0.1–1.0)
Monocytes Relative: 6 %
Neutro Abs: 7.4 10*3/uL (ref 1.7–7.7)
Neutrophils Relative %: 80 %
Platelets: 274 10*3/uL (ref 150–400)
RBC: 2.66 MIL/uL — ABNORMAL LOW (ref 3.87–5.11)
RDW: 15 % (ref 11.5–15.5)
WBC: 9.2 10*3/uL (ref 4.0–10.5)
nRBC: 0 % (ref 0.0–0.2)

## 2021-10-12 LAB — RENAL FUNCTION PANEL
Albumin: 2.8 g/dL — ABNORMAL LOW (ref 3.5–5.0)
Anion gap: 15 (ref 5–15)
BUN: 36 mg/dL — ABNORMAL HIGH (ref 6–20)
CO2: 25 mmol/L (ref 22–32)
Calcium: 9.3 mg/dL (ref 8.9–10.3)
Chloride: 97 mmol/L — ABNORMAL LOW (ref 98–111)
Creatinine, Ser: 6.08 mg/dL — ABNORMAL HIGH (ref 0.44–1.00)
GFR, Estimated: 8 mL/min — ABNORMAL LOW (ref >60)
Glucose, Bld: 166 mg/dL — ABNORMAL HIGH (ref 70–99)
Phosphorus: 4.3 mg/dL (ref 2.5–4.6)
Potassium: 4.4 mmol/L (ref 3.5–5.1)
Sodium: 137 mmol/L (ref 135–145)

## 2021-10-12 IMAGING — DX DG ABD PORTABLE 1V
1 series · 1 of 1 positions shown · non-contrast
Comparison: [DATE]

CLINICAL DATA: Constipation.

EXAM:
PORTABLE ABDOMEN - 1 VIEW

[abdomen]
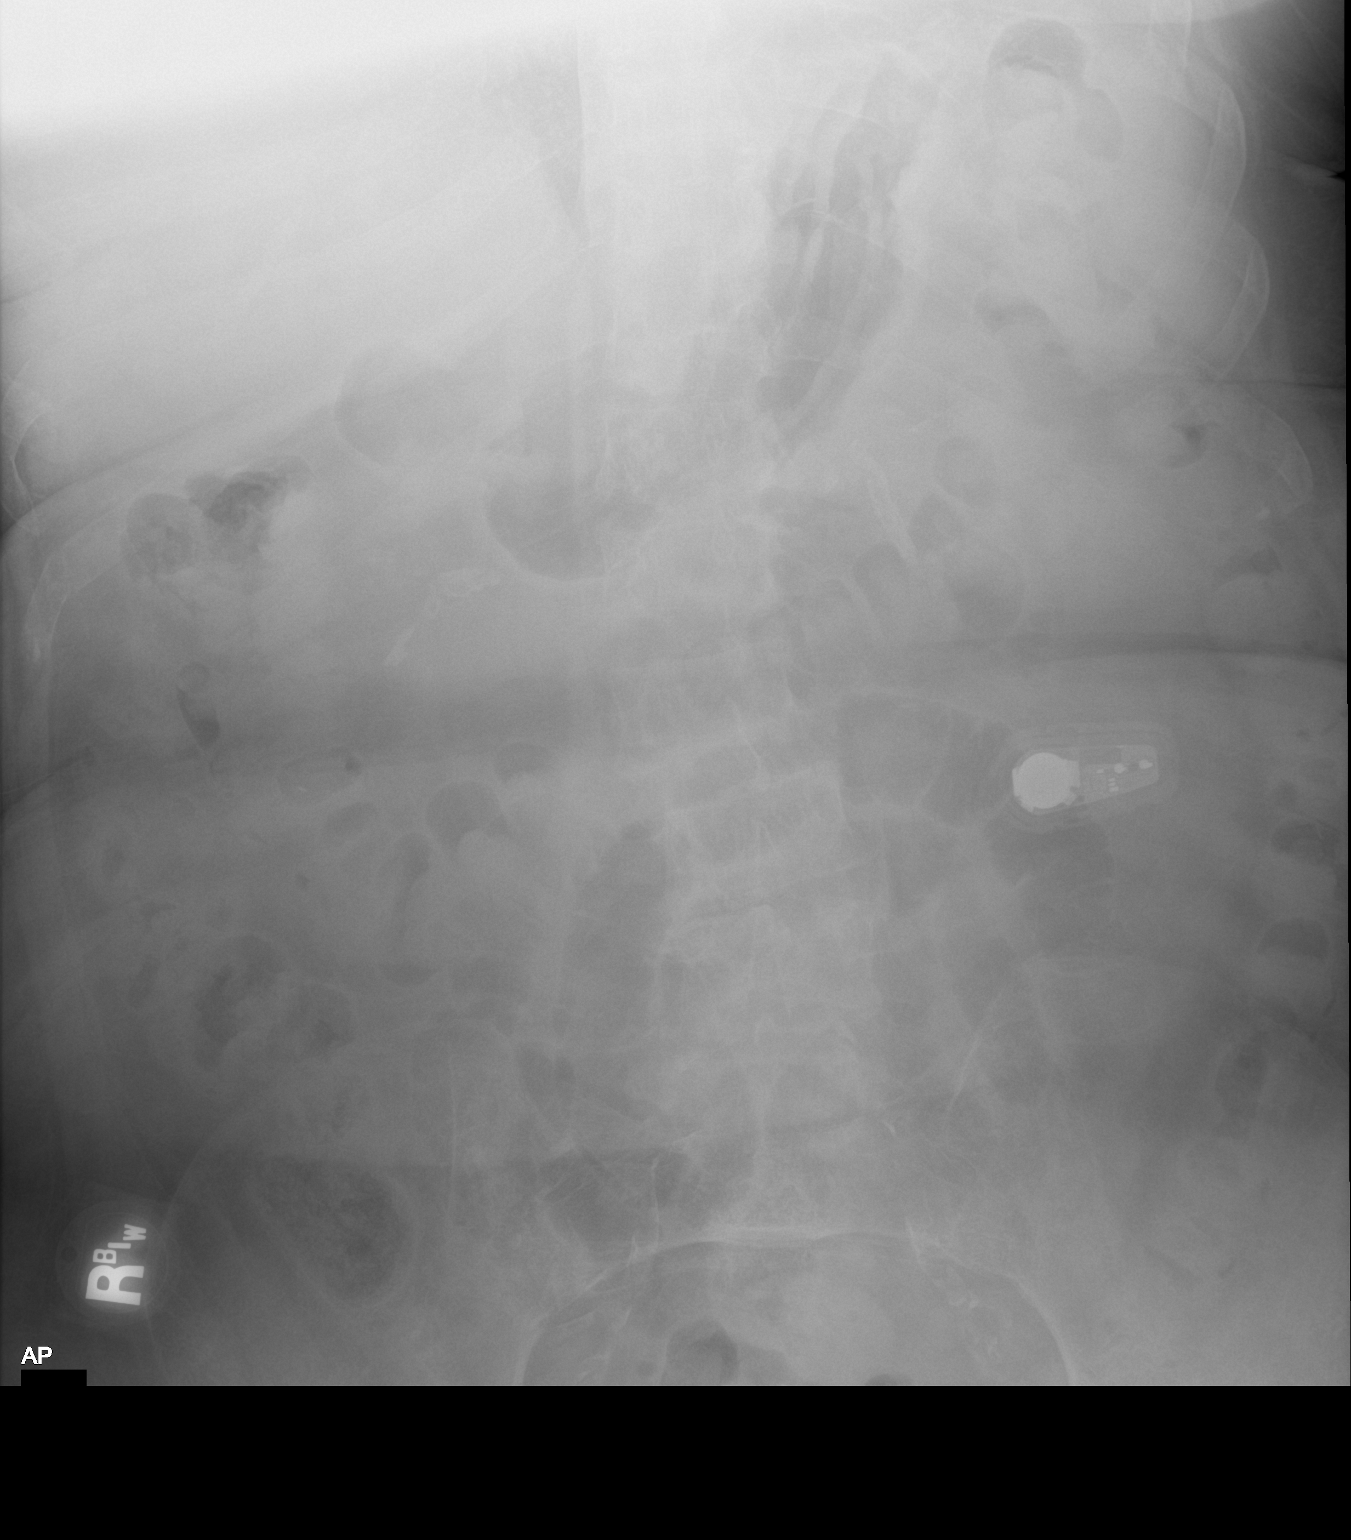

[1 of 1 positions shown; findings below may reference images not displayed]

FINDINGS: [E8] hours. Interval decrease in gas and stool within the colon.
Mild central small bowel distension is similar to prior. Bones are
diffusely demineralized.
IMPRESSION: Interval decrease in stool volume in the colon.

## 2021-10-12 NOTE — Progress Notes (Signed)
Pt's Dexcom sugar monitor reading is 162.  Pt will get 1 unit of insulin according to sliding scale.

## 2021-10-12 NOTE — Progress Notes (Signed)
°  [facility] Progress Note   Subjective: Seen in room. Bad constipation yesterday, had BM. Feels better.   Objective Vitals:   10/10/21 1956 10/11/21 0451 10/11/21 1939 10/12/21 0445  BP: (!) 152/77 (!) 161/71 (!) 148/64 (!) 141/64  Pulse: (!) 107 100 92 88  Resp: 18 17 18 17   Temp: 98.2 F (36.8 C) 97.9 F (36.6 C) 98.3 F (36.8 C) 97.7 F (36.5 C)  TempSrc:      SpO2: 98% 100% 98% 98%  Weight:      Height:         Additional Objective Labs: Basic Metabolic Panel: Recent Labs  Lab 10/06/21 0445 10/10/21 1459 10/12/21 0649  NA 131* 134* 137  K 5.1 4.4 4.4  CL 94* 94* 97*  CO2 24 26 25   GLUCOSE 172* 206* 166*  BUN 71* 51* 36*  CREATININE 9.02* 7.16* 6.08*  CALCIUM 9.1 8.7* 9.3  PHOS 5.9* 4.3 4.3    CBC: Recent Labs  Lab 10/06/21 0445 10/10/21 1459 10/12/21 0649  WBC 6.3 11.5* 9.2  NEUTROABS 5.0  --  7.4  HGB 7.7* 8.1* 8.3*  HCT 23.5* 24.4* 26.3*  MCV 95.5 97.6 98.9  PLT 188 266 274    Blood Culture No results found for: SDES, SPECREQUEST, CULT, REPTSTATUS   Physical Exam General: Well appearing, nad  Heart: RRR No m,r,g  Lungs: Clear bilaterally  Abdomen: soft non-tender  Extremities: Trace LE edema  Dialysis Access: R IJ TDC   Medications:   acetaminophen  1,000 mg Oral TID   vitamin C  500 mg Oral Daily   Chlorhexidine Gluconate Cloth  6 each Topical BID   Chlorhexidine Gluconate Cloth  6 each Topical Q0600   darbepoetin (ARANESP) injection - DIALYSIS  40 mcg Intravenous Q Fri-HD   diclofenac  1 patch Transdermal BID   diclofenac Sodium  2 g Topical QID   enoxaparin (LOVENOX) injection  30 mg Subcutaneous Q24H   feeding supplement (NEPRO CARB STEADY)  237 mL Oral BID BM   ferric citrate  420 mg Oral TID WC   insulin aspart  0-6 Units Subcutaneous TID WC   losartan  50 mg Oral Daily   methocarbamol  750 mg Oral QID   multivitamin  1 tablet Oral QHS   Vitamin D (Ergocalciferol)  50,000 Units Oral Q7 days    OP HD:   MWF [facility] [address] [phone])  3h 54min  81kg   Hep 3000 then 800u/hr  RIJ TDC       Assessment/ Plan: Debility - rehab per CIR Tendon rupture - RLE. Seen by orthopedics. For surgery Tuesday.  L hip fracture/ R pelvic ramus fx - sp L hemiarthroplasty on 09/29/21 at [facility] - on HD mwf. Continue on schedule. Next HD 2/13.  BP/ volume - at dry wt, cont losartan. Below dry weight. Post HD wt 78.6 kg  on 2/10  DM2 - per pmd Anemia ckd - Hb 7- 8 range. S/p transfusion. Will start ESA here - Aranesp 40 on 2/10.  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

## 2021-10-12 NOTE — Progress Notes (Signed)
PROGRESS NOTE   Subjective/Complaints:   Abd spasms improved/resolved.  Had multiple Bms yesterday and abd fullness improved/not resolved.   Still has ileus- wants full diet back- will wait until Ileus has resolved on KUB.    ROS:   Pt denies SOB, better abd pain, CP, N/V/C/D, and vision changes  Objective:   DG Abd 1 View  Result Date: 10/11/2021 CLINICAL DATA:  Abdominal spasm. Black tarry stools and constipation after hip surgery. Evaluate for ileus or constipation. EXAM: ABDOMEN - 1 VIEW COMPARISON:  06/09/2019 FINDINGS: There is mild gaseous distension of the large and small bowel loops. A moderate stool burden is also identified within the colon. No signs of bowel obstruction. IMPRESSION: Mild gaseous distension of the large and small bowel loops, likely reflecting mild ileus. Electronically Signed   By: Kerby Moors M.D.   On: 10/11/2021 11:02   DG Abd Portable 1V  Result Date: 10/12/2021 CLINICAL DATA:  Constipation. EXAM: PORTABLE ABDOMEN - 1 VIEW COMPARISON:  10/11/2021 FINDINGS: 1121 hours. Interval decrease in gas and stool within the colon. Mild central small bowel distension is similar to prior. Bones are diffusely demineralized. IMPRESSION: Interval decrease in stool volume in the colon. Electronically Signed   By: Misty Stanley M.D.   On: 10/12/2021 14:09   Recent Labs    10/10/21 1459 10/12/21 0649  WBC 11.5* 9.2  HGB 8.1* 8.3*  HCT 24.4* 26.3*  PLT 266 274    Recent Labs    10/10/21 1459 10/12/21 0649  NA 134* 137  K 4.4 4.4  CL 94* 97*  CO2 26 25  GLUCOSE 206* 166*  BUN 51* 36*  CREATININE 7.16* 6.08*  CALCIUM 8.7* 9.3     Intake/Output Summary (Last 24 hours) at 10/12/2021 1714 Last data filed at 10/12/2021 1325 Gross per 24 hour  Intake 840 ml  Output --  Net 840 ml        Physical Exam: Vital Signs Blood pressure 136/60, pulse 86, temperature 98.4 F (36.9 C), resp. rate 20,  height 5\' 7"  (1.702 m), weight 78.6 kg, SpO2 99 %.     General: awake, alert, appropriate, laying in bed- NAD HENT: conjugate gaze; oropharynx moist CV: regular rate; no JVD Pulmonary: CTA B/L; no W/R/R- good air movement GI: much more soft, NT, ND, (+)BS- hypoactive still Psychiatric: appropriate Neurological: Ox3 Musculoskeletal:        General: Swelling and tenderness present.     Cervical back: Normal range of motion and neck supple.     Right lower leg: Edema present.     Left lower leg: Edema present.     Comments: Left hip tender to palpation and PROM with associated swelling. Left knee with mild effusion, patella laxity. Right hip tender with leg raise  Right knee tender and swollen Skin:    Comments: Incision site dressing clean dry and intact.   Neurological:     Comments: Patient is alert.  No acute distress.  Oriented x3 and follows commands. Alert and oriented x 3. Normal insight and awareness. Intact Memory. Normal language and speech. Cranial nerve exam unremarkable. UE motor 5/5. LE motor limited proximally by pain. ADF/PF 4-5/5. No  sensory findings.   Psychiatric:     Comments: Pleasant, slightly anxious  GU: stool incontinence in diaper   Assessment/Plan: 1. Functional deficits which require 3+ hours per day of interdisciplinary therapy in a comprehensive inpatient rehab setting. Physiatrist is providing close team supervision and 24 hour management of active medical problems listed below. Physiatrist and rehab team continue to assess barriers to discharge/monitor patient progress toward functional and medical goals  Care Tool:  Bathing  Bathing activity did not occur: Safety/medical concerns Body parts bathed by patient: Right arm, Left arm, Chest, Abdomen, Face         Bathing assist Assist Level: Supervision/Verbal cueing (EOB)     Upper Body Dressing/Undressing Upper body dressing   What is the patient wearing?: Pull over shirt    Upper body  assist Assist Level: Supervision/Verbal cueing (EOB)    Lower Body Dressing/Undressing Lower body dressing      What is the patient wearing?: Incontinence brief     Lower body assist Assist for lower body dressing: Maximal Assistance - Patient 25 - 49%     Toileting Toileting    Toileting assist Assist for toileting: Maximal Assistance - Patient 25 - 49%     Transfers Chair/bed transfer  Transfers assist     Chair/bed transfer assist level: 2 Helpers     Locomotion Ambulation   Ambulation assist   Ambulation activity did not occur: Safety/medical concerns (weakness, fatigue, pain, anxiety)          Walk 10 feet activity   Assist  Walk 10 feet activity did not occur: Safety/medical concerns (weakness, fatigue, pain, anxiety)        Walk 50 feet activity   Assist Walk 50 feet with 2 turns activity did not occur: Safety/medical concerns (weakness, fatigue, pain, anxiety)         Walk 150 feet activity   Assist Walk 150 feet activity did not occur: Safety/medical concerns (weakness, fatigue, pain, anxiety)         Walk 10 feet on uneven surface  activity   Assist Walk 10 feet on uneven surfaces activity did not occur: Safety/medical concerns (weakness, fatigue, pain, anxiety)         Wheelchair     Assist Is the patient using a wheelchair?: Yes Type of Wheelchair: Manual Wheelchair activity did not occur: Safety/medical concerns (weakness, fatigue, pain, anxiety)         Wheelchair 50 feet with 2 turns activity    Assist    Wheelchair 50 feet with 2 turns activity did not occur: Safety/medical concerns (weakness, fatigue, pain, anxiety)       Wheelchair 150 feet activity     Assist  Wheelchair 150 feet activity did not occur: Safety/medical concerns (weakness, fatigue, pain, anxiety)       Blood pressure 136/60, pulse 86, temperature 98.4 F (36.9 C), resp. rate 20, height 5\' 7"  (1.702 m), weight 78.6 kg,  SpO2 99 %.    Medical Problem List and Plan: 1. Functional deficits secondary to nondisplaced fracture right inferior pubic ramus as well as a displaced foreshortened fracture through the left femoral neck/intertrochanteric area.  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   -Weightbearing as tolerated with anterior total hip precautions.                                  -Conservative care right inferior pubic ramus fracture, weightbearing as tolerated             -  patient may  shower             -ELOS/Goals: 10-14 days, supervision to min assist goals  Refused therapy due to abd issues- explained needs to do rest of week/for surgery Monday  Con't CIR  -HFU scheduled 2.  Antithrombotics: -DVT/anticoagulation:  Mechanical: Antiembolism stockings, thigh (TED hose) Bilateral lower extremities check vascular study.   -should be able to begin lovenox today as permacath placed            -antiplatelet therapy: N/A 3. Femur fracture pain: continue Flector patch,Robaxin 750 mg 4 times daily, hydrocodone and oxycodone as needed             -pt having significant spasms in left thigh.             -add kpad to help with spasms  -schedule tylenol 1000mg  TID 4. Anxiety: Instructed on deep breathing techniques. Provide emotional support.  Melatonin as needed             -antipsychotic agents: N/A 5. Neuropsych: This patient is capable of making decisions on her own behalf. 6. Skin/Wound Care: Routine skin checks 7. Fluids/Electrolytes/Nutrition: Routine in and outs with follow-up chemistries 8.  End-stage renal disease/hemodialysis.  Permacath exchanged 10/03/2021.    -  Follow-up hemodialysis per Dr. [name] -HD later in day to allow participation in therapies during the day 9.  Acute on chronic anemia.  Has had transfusions already x 2. Hgb has stabilized in 7 range..  Continue iron supplement.              -epo per nephrology 10.  Diabetes mellitus with peripheral neuropathy. Placed order  that she may use her Dexcom 6. Hemoglobin A1c 6.2.  SSI as prior to admission. Discuss Qutenza.  11.  Hypertension.  ContinueCozaar 50 mg daily, 12.  Hyperparathyroidism of renal origin.  Plan outpatient parathyroidectomy per Dr.Lateef 13.  Overweight.  BMI 29.11.  Dietary follow-up 14. Cushingoid?--outpt work up 22. Constipation: resolved. Decrease colace to daily prn. D/c senna.  16. Right quadriceps tear: plan for surgery Tuesday. Precautions as per ortho.  17. Gas: add simethicone 18. Ileus  2/11- d'x'd by KUB- will give liquid diet- Sorbitol and soap suds enema- to get cleaned out. -no NGT needed quite yet.   2/12- improved, but not resolved- wait to put back on previous diet. Will recheck KUB in AM 19. Abd spasms  2/11- B&O suppository for pain of spasms  2/12- improved once ileus treated 20. Black tarry stools  2/11- (+) hemoccult- will recheck labs in AM for possible GI bleed- look also at BUN  2/12- has resolved-  21. Ileus  2/12- started liquid diet- and got cleaned out with dig stim/enema and sorbitol-   I spent a total of 37   minutes on total care today- >50% coordination of care- due to reviewing KUB and d/w nursing at length- since still has ileus, won't stop liquid diet yet- will order KUB for tomorrow    LOS: 6 days A FACE TO FACE EVALUATION WAS PERFORMED  Lamara Brecht 10/12/2021, 5:14 PM

## 2021-10-12 NOTE — Progress Notes (Signed)
Pt's blood sugar- 160 this evening.

## 2021-10-13 ENCOUNTER — Inpatient Hospital Stay (HOSPITAL_COMMUNITY): Payer: Medicare Other

## 2021-10-13 LAB — OCCULT BLOOD X 1 CARD TO LAB, STOOL: Fecal Occult Bld: NEGATIVE

## 2021-10-13 LAB — RENAL FUNCTION PANEL
Albumin: 3 g/dL — ABNORMAL LOW (ref 3.5–5.0)
Anion gap: 15 (ref 5–15)
BUN: 50 mg/dL — ABNORMAL HIGH (ref 6–20)
CO2: 23 mmol/L (ref 22–32)
Calcium: 9.2 mg/dL (ref 8.9–10.3)
Chloride: 96 mmol/L — ABNORMAL LOW (ref 98–111)
Creatinine, Ser: 7.5 mg/dL — ABNORMAL HIGH (ref 0.44–1.00)
GFR, Estimated: 6 mL/min — ABNORMAL LOW (ref >60)
Glucose, Bld: 199 mg/dL — ABNORMAL HIGH (ref 70–99)
Phosphorus: 4.3 mg/dL (ref 2.5–4.6)
Potassium: 5.9 mmol/L — ABNORMAL HIGH (ref 3.5–5.1)
Sodium: 134 mmol/L — ABNORMAL LOW (ref 135–145)

## 2021-10-13 LAB — CBC
HCT: 26.4 % — ABNORMAL LOW (ref 36.0–46.0)
Hemoglobin: 8.2 g/dL — ABNORMAL LOW (ref 12.0–15.0)
MCH: 31.3 pg (ref 26.0–34.0)
MCHC: 31.1 g/dL (ref 30.0–36.0)
MCV: 100.8 fL — ABNORMAL HIGH (ref 80.0–100.0)
Platelets: 326 10*3/uL (ref 150–400)
RBC: 2.62 MIL/uL — ABNORMAL LOW (ref 3.87–5.11)
RDW: 14.8 % (ref 11.5–15.5)
WBC: 8.3 10*3/uL (ref 4.0–10.5)
nRBC: 0 % (ref 0.0–0.2)

## 2021-10-13 IMAGING — DX DG ABDOMEN 1V
2 series · 2 of 2 positions shown · non-contrast
Comparison: [DATE]

CLINICAL DATA: Ileus

EXAM:
ABDOMEN - 1 VIEW

[abdomen kub (1 of 2)]
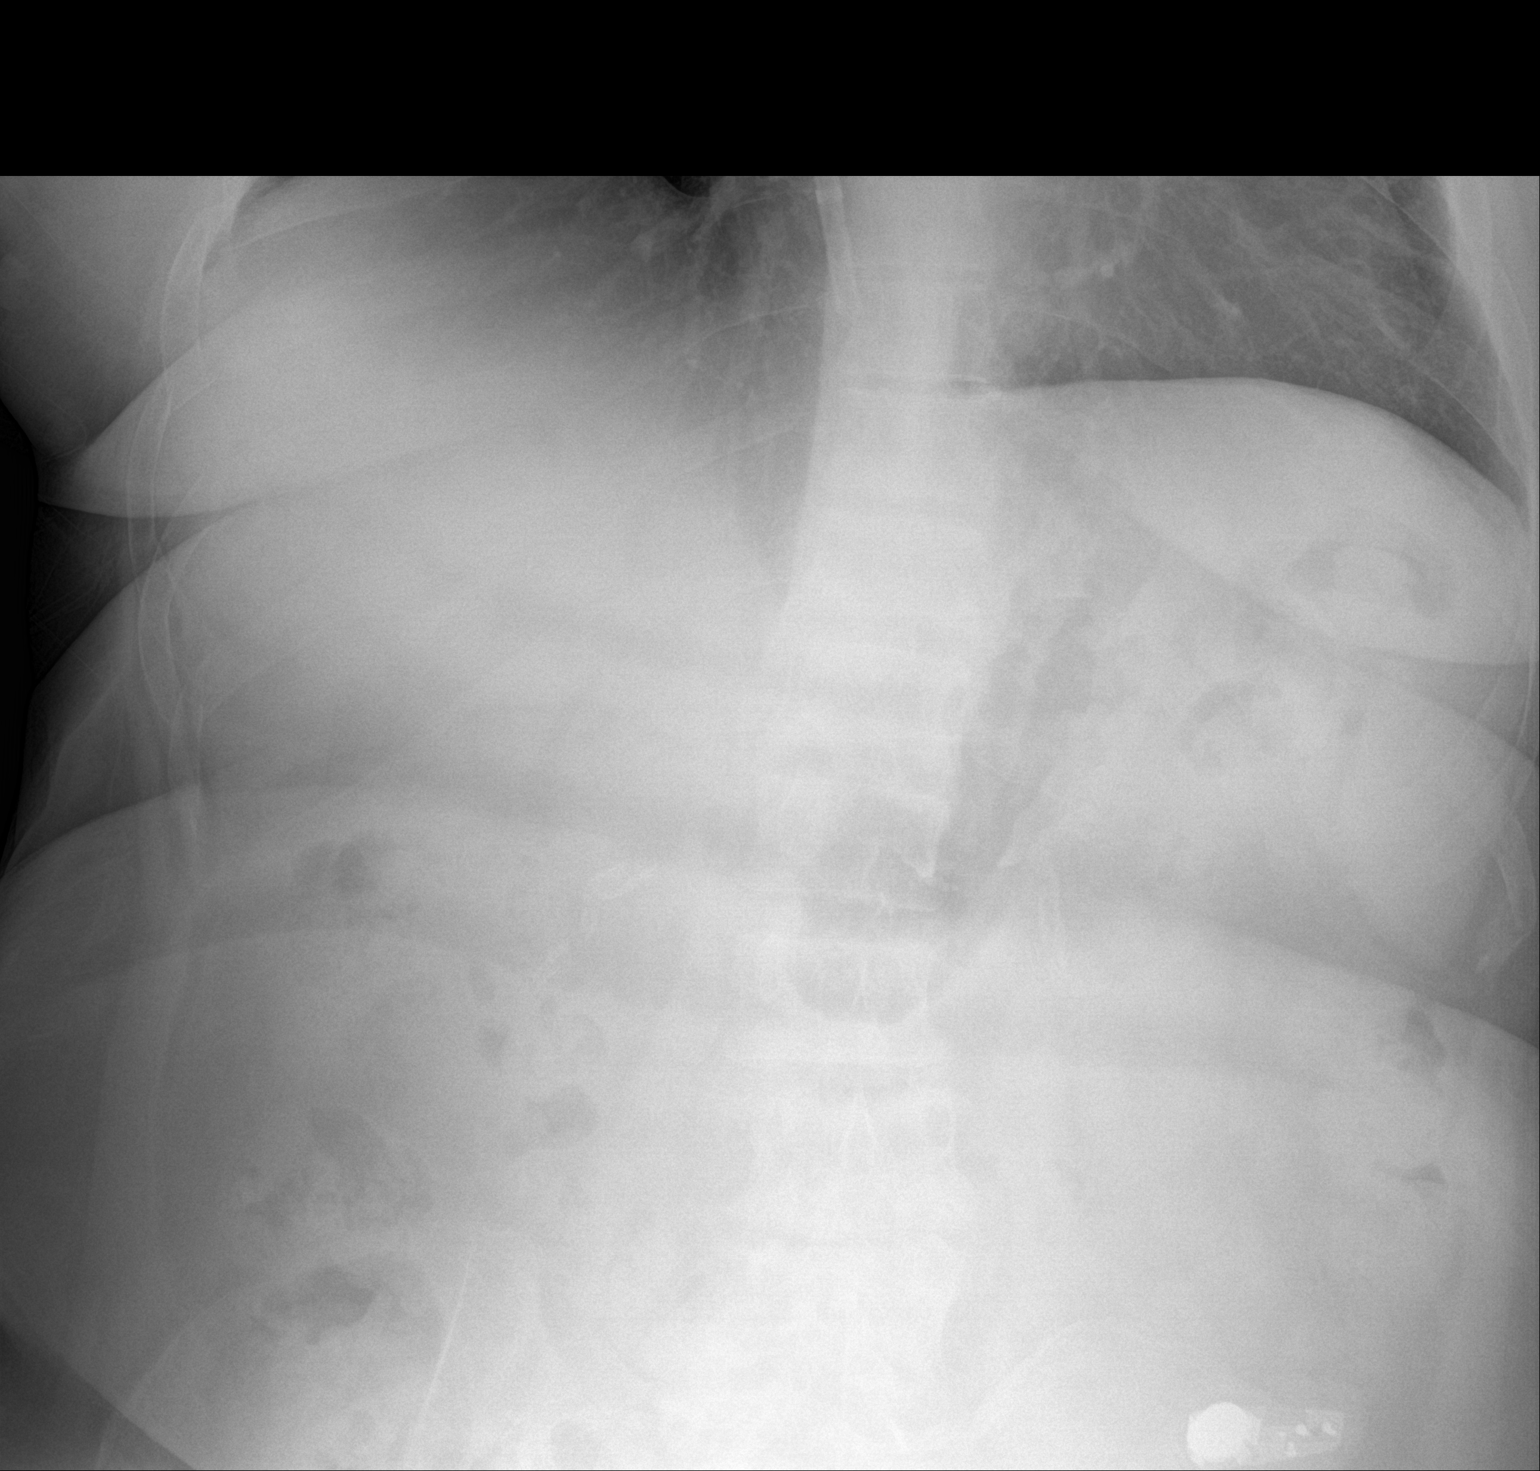

[abdomen kub (2 of 2)]
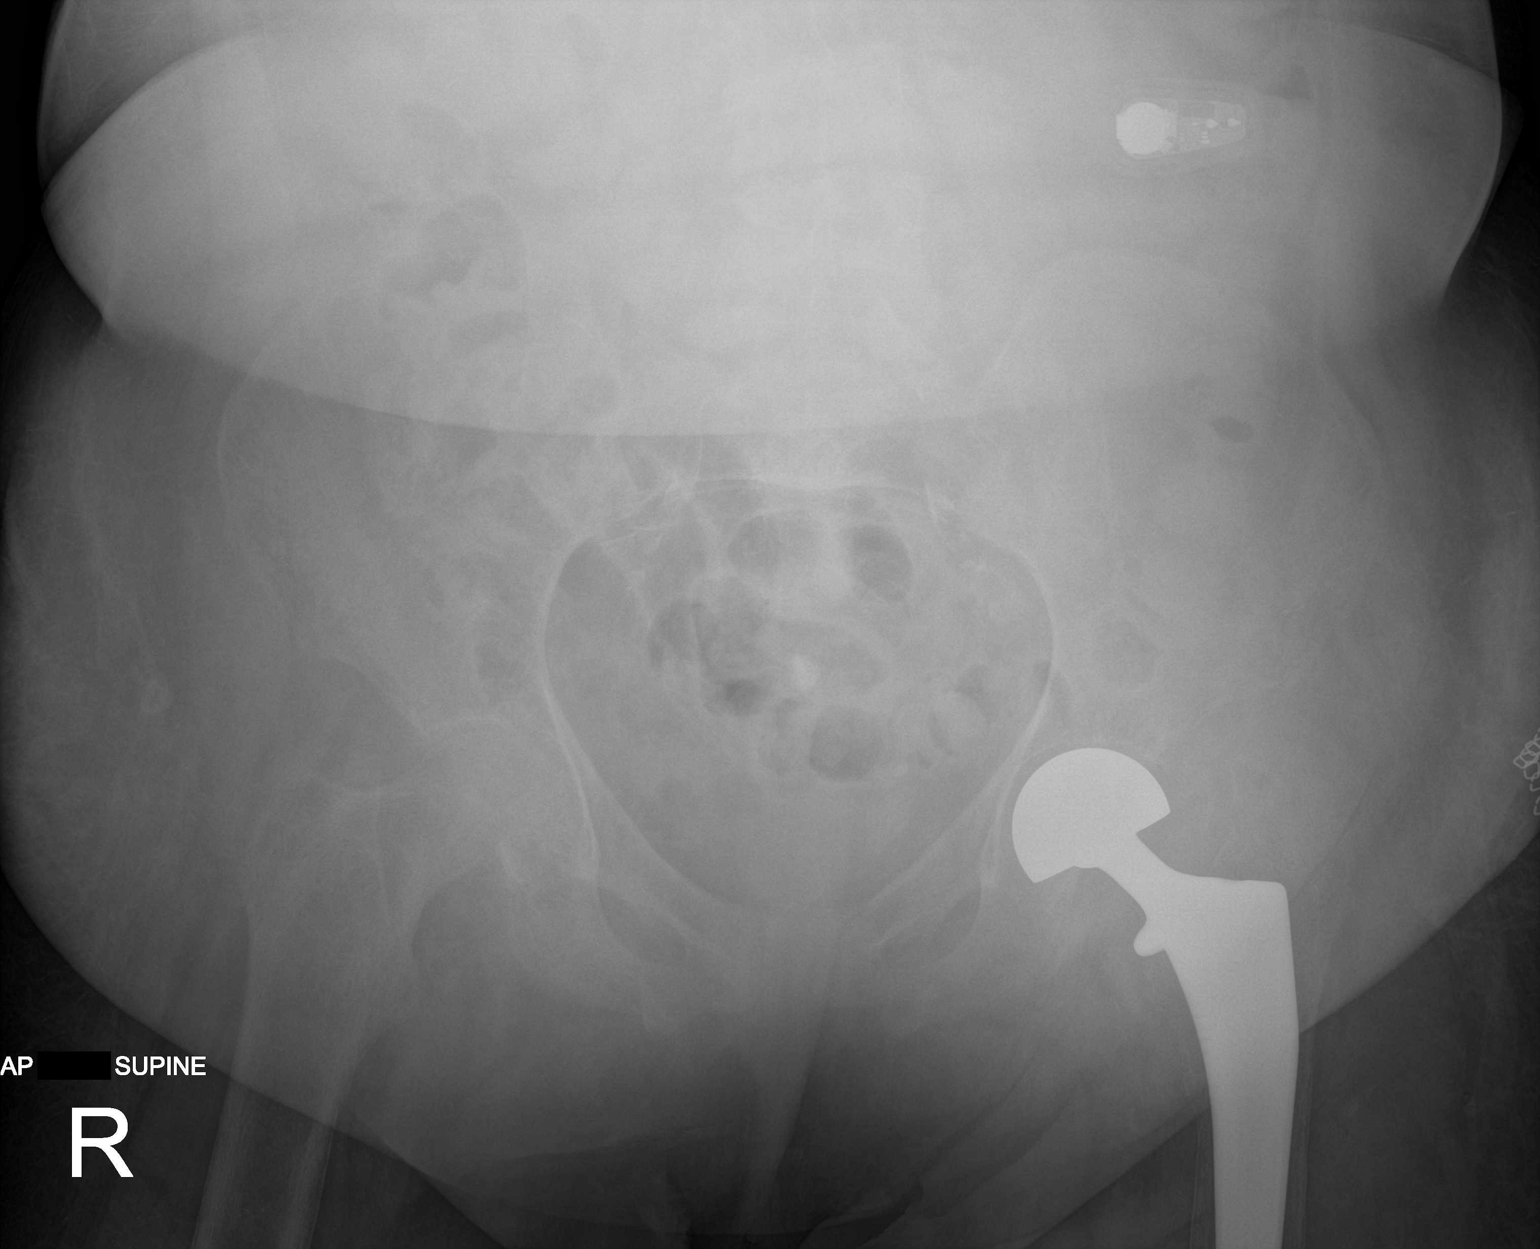

[2 of 2 positions shown; findings below may reference images not displayed]

FINDINGS: Vascular catheter tip over the low right atrium. Nonobstructed gas
pattern with mild stool. Left hip replacement.
IMPRESSION: Nonobstructed gas pattern

## 2021-10-13 MED ORDER — ENOXAPARIN SODIUM 30 MG/0.3ML IJ SOSY: 30.0000 mg | PREFILLED_SYRINGE | INTRAMUSCULAR | Status: DC

## 2021-10-13 MED ORDER — ACETAMINOPHEN 500 MG PO TABS: 1000.0000 mg | ORAL_TABLET | Freq: Three times a day (TID) | ORAL | 0 refills | Status: DC

## 2021-10-13 MED ORDER — HYDROCODONE-ACETAMINOPHEN 5-325 MG PO TABS: 1.0000 | ORAL_TABLET | ORAL | 0 refills | Status: DC | PRN

## 2021-10-13 MED ORDER — LIDOCAINE-PRILOCAINE 2.5-2.5 % EX CREA: 1.0000 "application " | TOPICAL_CREAM | CUTANEOUS | Status: DC | PRN

## 2021-10-13 MED ORDER — BELLADONNA ALKALOIDS-OPIUM 16.2-60 MG RE SUPP: 1.0000 | Freq: Four times a day (QID) | RECTAL | 0 refills | Status: DC | PRN

## 2021-10-13 MED ORDER — FERRIC CITRATE 1 GM 210 MG(FE) PO TABS: 420.0000 mg | ORAL_TABLET | Freq: Three times a day (TID) | ORAL | Status: DC

## 2021-10-13 MED ORDER — PENTAFLUOROPROP-TETRAFLUOROETH EX AERO: 1.0000 "application " | INHALATION_SPRAY | CUTANEOUS | Status: DC | PRN

## 2021-10-13 MED ORDER — HEPARIN SODIUM (PORCINE) 1000 UNIT/ML DIALYSIS: 20.0000 [IU]/kg | INTRAMUSCULAR | Status: DC | PRN

## 2021-10-13 MED ORDER — ALTEPLASE 2 MG IJ SOLR: 2.0000 mg | Freq: Once | INTRAMUSCULAR | Status: DC | PRN

## 2021-10-13 MED ORDER — ASCORBIC ACID 500 MG PO TABS: 500.0000 mg | ORAL_TABLET | Freq: Every day | ORAL | Status: DC

## 2021-10-13 MED ORDER — DARBEPOETIN ALFA 40 MCG/0.4ML IJ SOSY
40.0000 ug | PREFILLED_SYRINGE | INTRAMUSCULAR | Status: DC
Start: 2021-10-17 — End: 2021-10-17

## 2021-10-13 MED ORDER — FERRIC CITRATE 1 GM 210 MG(FE) PO TABS: 210.0000 mg | ORAL_TABLET | ORAL | Status: DC | PRN

## 2021-10-13 MED ORDER — INSULIN ASPART 100 UNIT/ML IJ SOLN: 0.0000 [IU] | Freq: Three times a day (TID) | INTRAMUSCULAR | 11 refills | Status: DC

## 2021-10-13 MED ORDER — HEPARIN SODIUM (PORCINE) 1000 UNIT/ML DIALYSIS: 1000.0000 [IU] | INTRAMUSCULAR | Status: DC | PRN

## 2021-10-13 MED ORDER — DICLOFENAC EPOLAMINE 1.3 % EX PTCH: 1.0000 | MEDICATED_PATCH | Freq: Two times a day (BID) | CUTANEOUS | Status: DC

## 2021-10-13 MED ORDER — LIDOCAINE HCL (PF) 1 % IJ SOLN: 5.0000 mL | INTRAMUSCULAR | Status: DC | PRN

## 2021-10-13 MED ORDER — HEPARIN SODIUM (PORCINE) 1000 UNIT/ML IJ SOLN
INTRAMUSCULAR | Status: AC
  Administered 2021-10-13: 1600 [IU] via INTRAVENOUS_CENTRAL
  Filled 2021-10-13: qty 4

## 2021-10-13 MED ORDER — DICLOFENAC SODIUM 1 % EX GEL
2.0000 g | Freq: Four times a day (QID) | CUTANEOUS | Status: DC
Start: 2021-10-13 — End: 2021-11-08

## 2021-10-13 MED ORDER — SODIUM CHLORIDE 0.9 % IV SOLN: 100.0000 mL | INTRAVENOUS | Status: DC | PRN

## 2021-10-13 MED ORDER — OXYCODONE HCL 5 MG PO TABS: 5.0000 mg | ORAL_TABLET | ORAL | 0 refills | Status: DC | PRN

## 2021-10-13 NOTE — Progress Notes (Signed)
Occupational Therapy Session Note  Patient Details  Name: [name] MRN: [id number] Date of Birth: [birth year]  Today's Date: 10/13/2021 OT Missed Time: 68 Minutes Missed Time Reason: Unavailable (comment) (pt out of room for dialysis)   Therapist attempted to see pt, however pt and pt's bed were out of room upon arrival with nursing informing that pt was taken to dialysis until about 12 PM. Pt missed 60 mins of OT intervention due to dialysis. Will attempt to make up missed mins as time allows.   Therapy/Group: Individual Therapy  [name] E Izel Hochberg 10/13/2021, 7:29 AM

## 2021-10-13 NOTE — Progress Notes (Signed)
Physical Therapy Note  Patient Details  Name: [name] MRN: [id number] Date of Birth: [birth year] Today's Date: 10/13/2021    Pt's plan of care adjusted to QD after speaking with care team and discussed with MD as pt currently unable to tolerate current therapy schedule with OT and PT.    Betsey Holiday Lyn Hollingshead PT, DPT   10/13/2021, 1:44 PM

## 2021-10-13 NOTE — Progress Notes (Signed)
Physical Therapy Discharge Summary  Patient Details  Name: [name] MRN: [id number] Date of Birth: March 11, 1966  Patient has met 0 of 10 long term goals due to increased strength and increased range of motion. Patient to discharge at a bed/wheelchair level Total Assist. Patient's care partner unavailable to provide the necessary physical assistance at discharge. Pt discharging from CIR due to plan for surgery to repair R quad tendon tear on 2/16.   Reasons goals not met: Pt did not meet any goals due to multiple refusals to get OOB due to pain and fear of movement.   Recommendation:  Patient will benefit from ongoing skilled PT services in skilled nursing facility setting to continue to advance safe functional mobility, address ongoing impairments in transfers, generalized strengthening and ROM, standing balance and tolerance, gait training, endurance, and minimize fall risk.  Equipment: Hospital bed and [facility] lift at this time as pt has refused OOB mobility   Reasons for discharge: change in medical status and plan for surgery to repair quad tendon tear on 2/16.  Patient/family agrees with progress made and goals achieved: Yes  PT Discharge Precautions/Restrictions Precautions Precautions: Fall Required Braces or Orthoses: Knee Immobilizer - Right Knee Immobilizer - Right: On at all times Restrictions Weight Bearing Restrictions: No RLE Weight Bearing: Weight bearing as tolerated LLE Weight Bearing: Weight bearing as tolerated Pain Interference Pain Interference Pain Effect on Sleep: 1. Rarely or not at all Pain Interference with Therapy Activities: 1. Rarely or not at all Pain Interference with Day-to-Day Activities: 2. Occasionally Cognition Overall Cognitive Status: Within Functional Limits for tasks assessed Arousal/Alertness: Awake/alert Orientation Level: Oriented X4 Memory: Appears intact Awareness: Appears intact Problem Solving: Appears intact Safety/Judgment:  Appears intact Comments: very anxious and self-limiting Sensation Sensation Light Touch: Appears Intact Peripheral sensation comments: absent sensation along L great toe Proprioception: Appears Intact Coordination Gross Motor Movements are Fluid and Coordinated: No Fine Motor Movements are Fluid and Coordinated: Yes Coordination and Movement Description: generalized weakness/deconditioning, high pain levels, and fear of movement Finger Nose Finger Test: [facility] Heel Shin Test: decreased ROM on RLE and unable to lift LLE Motor  Motor Motor: Abnormal postural alignment and control Motor - Skilled Clinical Observations: generalized weakness/deconditioning, high pain levels, and fear of movement  Mobility Bed Mobility Bed Mobility: Rolling Right;Rolling Left;Supine to Sit;Sit to Supine Rolling Right: Minimal Assistance - Patient > 75% Rolling Left: Minimal Assistance - Patient > 75% Supine to Sit: Maximal Assistance - Patient - Patient 25-49% Sit to Supine: Maximal Assistance - Patient 25-49% Transfers Transfers: Sit to Stand;Stand to [employer];Lateral/Scoot Transfers Sit to Stand: 2 Helpers;Dependent - mechanical lift Stand to Sit: 2 Helpers;Dependent - mechanical lift Stand Pivot Transfers: Dependent - mechanical lift;2 Helpers Lateral/Scoot Transfers: 2 Helpers (slideboard) Transfer (Assistive device): Other (Comment) (slideboard) Locomotion  Gait Ambulation: No Gait Gait: No Stairs / Additional Locomotion Stairs: No Wheelchair Mobility Wheelchair Mobility: No (refused)  Trunk/Postural Assessment  Cervical Assessment Cervical Assessment: Within Functional Limits Thoracic Assessment Thoracic Assessment: Exceptions to [facility] (rounded shoulders) Lumbar Assessment Lumbar Assessment: Exceptions to [facility] (posterior pelvic tilt) Postural Control Postural Control: Within Functional Limits  Balance Balance Balance Assessed: Yes Static Sitting Balance Static Sitting -  Balance Support: Bilateral upper extremity supported;Feet supported Static Sitting - Level of Assistance: 6: Modified independent (Device/Increase time) Dynamic Sitting Balance Dynamic Sitting - Balance Support: Feet supported;No upper extremity supported Dynamic Sitting - Level of Assistance: 5: Stand by assistance (supervision) Static Standing Balance Static Standing - Balance Support: Bilateral  upper extremity supported Static Standing - Level of Assistance: 1: +2 Total assist Extremity Assessment  RLE Assessment RLE Assessment: Exceptions to [facility] RLE Strength Right Hip Flexion: 3-/5 Right Hip ABduction: 3-/5 Right Hip ADduction: 3-/5 Right Knee Flexion: 3-/5 Right Knee Extension: 3+/5 Right Ankle Dorsiflexion: 3/5 Right Ankle Plantar Flexion: 3/5 LLE Assessment LLE Assessment: Exceptions to [facility] LLE Strength Left Hip Flexion: 2+/5 Left Hip ABduction: 2+/5 Left Hip ADduction: 2+/5 Left Knee Flexion: 2+/5 Left Knee Extension: 2+/5 Left Ankle Dorsiflexion: 3/5 Left Ankle Plantar Flexion: 3/5  Charizma Gardiner M Lyn Hollingshead PT, DPT  10/13/2021, 1:26 PM

## 2021-10-13 NOTE — Plan of Care (Signed)
Incontinent of bowel

## 2021-10-13 NOTE — Progress Notes (Signed)
Inpatient Rehabilitation Discharge Medication Review by a Pharmacist  A complete drug regimen review was completed for this patient to identify any potential clinically significant medication issues.  High Risk Drug Classes Is patient taking? Indication by Medication  Antipsychotic No   Anticoagulant Yes Lovenox for DVT px  Antibiotic No   Opioid Yes Oxycodone/hydrocodone for pain  Antiplatelet No   Hypoglycemics/insulin Yes SSI for DM  Vasoactive Medication Yes Losartan for HTN  Chemotherapy No   Other Yes Aurexia - phos binder Robaxin - spasms Aranesp - anemia     Type of Medication Issue Identified Description of Issue Recommendation(s)  Drug Interaction(s) (clinically significant)     Duplicate Therapy     Allergy     No Medication Administration End Date     Incorrect Dose     Additional Drug Therapy Needed     Significant med changes from prior encounter (inform family/care partners about these prior to discharge).    Other       Clinically significant medication issues were identified that warrant physician communication and completion of prescribed/recommended actions by midnight of the next day:  No  Name of provider notified for urgent issues identified:   Provider Method of Notification:   Pharmacist comments:   Time spent performing this drug regimen review (minutes):  2min   Onnie Boer, PharmD, [place], AAHIVP, CPP Infectious Disease Pharmacist 10/13/2021 1:04 PM

## 2021-10-13 NOTE — Discharge Summary (Signed)
Physician Discharge Summary  Patient ID: [name] MRN: [id number] DOB/AGE: November 11, 1965 56 y.o.  Admit date: 10/06/2021 Discharge date: 10/16/2021  Discharge Diagnoses:  Principal Problem:   Femur fracture ([place]) Right quadricep tear DVT prophylaxis End-stage renal disease with hemodialysis Acute on chronic anemia Diabetes mellitus with peripheral neuropathy Hypertension Hyperparathyroidism Obesity Constipation/ileus  Discharged Condition: Stable  Significant Diagnostic Studies: DG Abd 1 View  Result Date: 10/13/2021 CLINICAL DATA:  Ileus EXAM: ABDOMEN - 1 VIEW COMPARISON:  10/13/2011 FINDINGS: Vascular catheter tip over the low right atrium. Nonobstructed gas pattern with mild stool. Left hip replacement. IMPRESSION: Nonobstructed gas pattern Electronically Signed   By: Donavan Foil M.D.   On: 10/13/2021 15:19   DG Abd 1 View  Result Date: 10/11/2021 CLINICAL DATA:  Abdominal spasm. Black tarry stools and constipation after hip surgery. Evaluate for ileus or constipation. EXAM: ABDOMEN - 1 VIEW COMPARISON:  06/09/2019 FINDINGS: There is mild gaseous distension of the large and small bowel loops. A moderate stool burden is also identified within the colon. No signs of bowel obstruction. IMPRESSION: Mild gaseous distension of the large and small bowel loops, likely reflecting mild ileus. Electronically Signed   By: Kerby Moors M.D.   On: 10/11/2021 11:02   CT Hip Left Wo Contrast  Result Date: 09/28/2021 CLINICAL DATA:  Fall, left hip fracture EXAM: CT OF THE LEFT HIP WITHOUT CONTRAST TECHNIQUE: Multidetector CT imaging of the left hip was performed according to the standard protocol. Multiplanar CT image reconstructions were also generated. RADIATION DOSE REDUCTION: This exam was performed according to the departmental dose-optimization program which includes automated exposure control, adjustment of the mA and/or kV according to patient size and/or use of iterative reconstruction  technique. COMPARISON:  X-ray 09/28/2021, CT 06/09/2019 FINDINGS: Bones/Joint/Cartilage Marked diffuse osteopenia. Acute transcervical fracture through the left femoral neck with significant varus angulation. No definite intertrochanteric involvement. Hip joint alignment is maintained without dislocation. Acute nondisplaced fracture of the right inferior pubic ramus (series 2, image 91). No additional fractures are identified. Moderate arthropathy of the pubic symphysis with new erosive or resorptive changes of the bilateral pubic bones. Symphysis is mildly widened compared to previous CT. Ligaments Suboptimally assessed by CT. Muscles and Tendons No acute musculotendinous abnormality by CT. Soft tissues Mild soft tissue swelling at the femoral neck fracture site. No organized hematoma. No acute findings within the visualized portion of the left hemipelvis. IMPRESSION: 1. Acute transcervical fracture through the LEFT femoral neck with varus angulation. 2. Acute nondisplaced fracture of the RIGHT inferior pubic ramus. 3. Moderate arthropathy of the pubic symphysis with new resorptive or erosive changes of the bilateral pubic bones. This may be secondary to underlying renal osteodystrophy. Septic arthritis could have this appearance in the appropriate clinical setting. Mild widening at the pubic symphysis is favored secondary to underlying arthropathy/resorption rather than posttraumatic diastasis. 4. Marked bony demineralization. Electronically Signed   By: Davina Poke D.O.   On: 09/28/2021 18:56   MR KNEE RIGHT WO CONTRAST  Result Date: 10/09/2021 CLINICAL DATA:  Right knee pain after fall.  Abnormal x-ray EXAM: MRI OF THE RIGHT KNEE WITHOUT CONTRAST TECHNIQUE: Multiplanar, multisequence MR imaging of the knee was performed. No intravenous contrast was administered. COMPARISON:  X-ray 10/07/2021 FINDINGS: Technical Note: Despite efforts by the technologist and patient, motion artifact is present on today's  exam and could not be eliminated. This reduces exam sensitivity and specificity. MENISCI Medial meniscus:  Intact. Lateral meniscus:  Intact. LIGAMENTS Cruciates: Intact ACL and PCL. Collaterals:  Intact MCL. Lateral collateral ligament complex intact. CARTILAGE Patellofemoral: Irregular full-thickness cartilage loss involving the superior aspect of the lateral and medial patellar facets, which may be degenerative or posttraumatic. High-grade near-full thickness cartilage defect within the central trochlear groove measuring approximately 11 x 5 mm. Medial: Mild diffuse chondral thinning of the weight-bearing medial compartment. Lateral:  No chondral defect. MISCELLANEOUS Joint: Large complex knee joint effusion. Thickened, edematous appearance of the suprapatellar fat pad. Popliteal Fossa:  No Baker's cyst. Intact popliteus tendon. Extensor Mechanism: High-grade, near full-thickness tear of the distal quadriceps tendon with less than 1 cm of retraction. The superficial most aspect of the distal tendon remains intact. Low signal cortical avulsion fragments are also avulsed from the superior cortex of the patella (series 12, image 17). Laxity of the patellar tendon without tear. Bones: Cortical avulsion of the superior pole of the patella. Patella baja alignment. Patchy marrow edema within the distal femur and proximal tibia are nonspecific and could be posttraumatic or related to osteopenia. Previously described area of relative decreased density in the proximal tibial metaphysis is consistent with fatty bone marrow. Similar findings are seen within the distal femoral metaphysis (series 9, image 17). No suspicious marrow replacing bone lesion. Other: Diffuse soft tissue swelling and edema with small amount of ill-defined hemorrhage along the anterolateral aspect of the knee. IMPRESSION: 1. High-grade, near full-thickness mildly retracted tear of the distal quadriceps tendon with associated cortical avulsion of the  superior pole of the patella. 2. Large complex knee joint effusion. 3. Patchy marrow edema within the distal femur and proximal tibia are nonspecific and could be posttraumatic or related to osteopenia. 4. Previously described area of decreased density in the proximal tibial metaphysis is consistent with fatty bone marrow. No suspicious marrow replacing bone lesion. 5. 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 6. Intact menisci.  Intact cruciate and collateral ligaments. Electronically Signed   By: Davina Poke D.O.   On: 10/09/2021 08:18   PERIPHERAL VASCULAR CATHETERIZATION  Result Date: 10/03/2021 See surgical note for result.  DG Chest Port 1 View  Result Date: 09/28/2021 CLINICAL DATA:  Fall EXAM: PORTABLE CHEST 1 VIEW COMPARISON:  None. FINDINGS: The cardiomediastinal silhouette is enlarged in contour.RIGHT chest CVC tip terminating over the superior cavoatrial junction. Enlarged appearance of bilateral hilar contours most consistent with enlarged pulmonary arteries and likely underlying pulmonary arterial hypertension. Elevation of the RIGHT hemidiaphragm. No pleural effusion. No pneumothorax. No acute pleuroparenchymal abnormality. Visualized abdomen is unremarkable. Osteopenia. IMPRESSION: 1. Cardiomegaly with enlarged hilar contours likely reflecting enlarged pulmonary arteries and underlying pulmonary arterial hypertension. Electronically Signed   By: Valentino Saxon M.D.   On: 09/28/2021 17:05   DG Knee Right Port  Result Date: 10/07/2021 CLINICAL DATA:  Trauma, fall, pain EXAM: PORTABLE RIGHT KNEE - 1-2 VIEW COMPARISON:  None. FINDINGS: No recent fracture or dislocation is seen. There is small to moderate effusion in the suprapatellar bursa. Erosive change seen in the posterior margin of patella may be due to degenerative arthritis. There are faint calcifications in the quadriceps tendon close to patella, possibly  calcific tendinosis or bursitis. Osteopenia is seen in bony structures. In the AP view, there is 4.2 x 3.1 cm area of subtle decreased density with possible disruption of trabeculae in the proximal shaft of tibia. There is no break in the cortical margins. IMPRESSION: No recent fracture or dislocation is seen. Small to moderate effusion is present in the suprapatellar bursa. There is ill-defined 4.2 x  3.1 cm area of decreased density with possible disruption of trabeculae in the central portion of proximal shaft of tibia. This may be due to osteopenia or suggest a space-occupying lesion. Comparison with previous studies or follow-up CT or MRI may be considered. Electronically Signed   By: Elmer Picker M.D.   On: 10/07/2021 15:20   DG Abd Portable 1V  Result Date: 10/12/2021 CLINICAL DATA:  Constipation. EXAM: PORTABLE ABDOMEN - 1 VIEW COMPARISON:  10/11/2021 FINDINGS: 1121 hours. Interval decrease in gas and stool within the colon. Mild central small bowel distension is similar to prior. Bones are diffusely demineralized. IMPRESSION: Interval decrease in stool volume in the colon. Electronically Signed   By: Misty Stanley M.D.   On: 10/12/2021 14:09   DG C-Arm 1-60 Min-No Report  Result Date: 09/29/2021 Fluoroscopy was utilized by the requesting physician.  No radiographic interpretation.   DG HIP UNILAT WITH PELVIS 1V LEFT  Result Date: 09/29/2021 CLINICAL DATA:  Left hip arthroplasty, fracture left femur EXAM: DG HIP (WITH OR WITHOUT PELVIS) 1V*L* COMPARISON:  09/28/2021 FINDINGS: Fluoroscopic images show interval left hip arthroplasty. Fluoroscopic time was 6 seconds. Radiation dose is 0.67 mGy. IMPRESSION: Fluoroscopic assistance was provided for left hip arthroplasty. Electronically Signed   By: Elmer Picker M.D.   On: 09/29/2021 17:02   DG HIP UNILAT WITH PELVIS 2-3 VIEWS LEFT  Result Date: 10/02/2021 CLINICAL DATA:  Left hip pain. EXAM: DG HIP (WITH OR WITHOUT PELVIS) 2-3V LEFT  COMPARISON:  Left hip fluoroscopy 09/29/2021 FINDINGS: Interval total left hip arthroplasty. No perihardware lucency is seen to indicate hardware failure or loosening. Mild-to-moderate right femoroacetabular joint space narrowing. There is diffuse decreased bone mineralization. Expected postoperative changes of the left hip including lateral subcutaneous air and lateral surgical skin staples. IMPRESSION: Interval total left hip arthroplasty without evidence of hardware failure. Electronically Signed   By: Yvonne Kendall M.D.   On: 10/02/2021 13:08   DG Hip Unilat W or Wo Pelvis 2-3 Views Left  Result Date: 09/28/2021 CLINICAL DATA:  Fall EXAM: DG HIP (WITH OR WITHOUT PELVIS) 2-3V LEFT COMPARISON:  None. FINDINGS: Osteopenia. There is a displaced fracture through the neck and intratrochanteric LEFT femur with superior translocation of the distal femur. Femoral head appears to be seated within the acetabulum although evaluation is limited by body habitus. No additional fracture noted. Limited assessment of the sacrum secondary to overlapping bowel contents and profound osteopenia. IMPRESSION: Displaced foreshortened fracture of the neck and inter trochanteric LEFT femur. Electronically Signed   By: Valentino Saxon M.D.   On: 09/28/2021 17:04    Labs:  Basic Metabolic Panel: Recent Labs  Lab 10/10/21 1459 10/12/21 0649 10/13/21 0811  NA 134* 137 134*  K 4.4 4.4 5.9*  CL 94* 97* 96*  CO2 26 25 23   GLUCOSE 206* 166* 199*  BUN 51* 36* 50*  CREATININE 7.16* 6.08* 7.50*  CALCIUM 8.7* 9.3 9.2  PHOS 4.3 4.3 4.3    CBC: Recent Labs  Lab 10/10/21 1459 10/12/21 0649 10/13/21 0811  WBC 11.5* 9.2 8.3  NEUTROABS  --  7.4  --   HGB 8.1* 8.3* 8.2*  HCT 24.4* 26.3* 26.4*  MCV 97.6 98.9 100.8*  PLT 266 274 326    CBG: Recent Labs  Lab 10/09/21 1252 10/15/21 1914  GLUCAP 147* 142*   Family history.  Mother with emphysema.  Sister with diabetes mellitus and hypertension.  Denies any  pancreatic cancer esophageal cancer or rectal cancer  Brief HPI:  [name] is a 56 y.o. right-handed female with history of end-stage renal disease with hemodialysis via right IJ, diabetes mellitus peripheral neuropathy acute on chronic anemia hyperlipidemia obesity, hyperparathyroidism of renal origin awaiting outpatient scheduling for evaluation of parathyroidectomy, hypertension.  Per chart review patient lives with spouse.  Modified independent prior to admission.  Presented to [facility] 09/28/2021 with complaints of severe left hip pain.  Patient states her leg gave out she heard a pop in her left hip.  She also complains of right side hip pain from a fall.  X-rays and imaging revealed displaced foreshortened fracture of the neck and intertrochanteric left femur as well as acute nondisplaced fracture of the right inferior pubic ramus.  Patient underwent left hip anterior hip hemiarthroplasty 09/29/2021 per Dr. Kurtis Bushman.  Conservative care of right inferior pubic ramus fracture.  Patient currently weightbearing as tolerated left lower extremity as well as weightbearing as tolerated right lower extremity.  Hospital course hemodialysis ongoing vascular surgery consulted to evaluate for exchange of permacath from right IJ to better dialysis 10/03/2021.  Hospital course acute on chronic anemia latest hemoglobin 6.7 she was transfused latest hemoglobin 7.7.  Therapy evaluations completed due to patient decreased functional mobility was admitted for a comprehensive rehab program.   Hospital Course: [name] was admitted to rehab 10/06/2021 for inpatient therapies to consist of PT, ST and OT at least three hours five days a week. Past admission physiatrist, therapy team and rehab RN have worked together to provide customized collaborative inpatient rehab.  Pertaining to patient's nondisplaced fracture right inferior pubic ramus as well as displaced foreshortened fracture through the left femoral  neck/intertrochanteric area.  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  Weightbearing as tolerated with anterior hip precautions.  During patient's rehab hospital course patient with increased right knee pain MRI completed showing high-grade near full-thickness mildly retracted tear of the distal quadricep tendon with associated cortical avulsion of the superior pole the patella.  Large complex knee joint effusion.  Orthopedic services consulted patient was to undergo surgery 10/14/2021 and will be discharged from the acute care service for upcoming surgical needs.  She was placed in a knee immobilizer.  Patient was maintained on Lovenox for DVT prophylaxis.  No bleeding episodes.  Hemodialysis ongoing as per renal services patient had undergone permacath exchange 10/03/2021.  Acute on chronic anemia monitor closely iron supplement as directed.  Blood sugars hemoglobin A1c 6.2 SSI diabetic teaching.  Blood pressure controlled on Cozaar.  History of hyperparathyroidism of renal origin plan outpatient parathyroidectomy per Dr.Lateef.  Constipation developed some nausea vomiting with KUB completed showing interval decrease in stool volume of colon after initial KUB 10/11/2021 likely reflecting mild ileus her diet was downgraded to slowly advance as tolerated.   Blood pressures were monitored on TID basis and soft and monitored  Diabetes has been monitored with ac/hs CBG checks and SSI was use prn for tighter BS control.    Rehab course: During patient's stay in rehab weekly team conferences were held to monitor patient's progress, set goals and discuss barriers to discharge. At admission, patient required moderate assist lateral scoot transfers mod max assist ambulate 3 feet rolling walker  Physical exam.  Blood pressure 145/66 pulse 101 temperature 98.3 respirations 20 oxygen saturation 90% room air Constitutional.  No acute distress HEENT Head.  Normocephalic and atraumatic Eyes.  Pupils round  and reactive to light no discharge without nystagmus Neck.  Supple nontender no JVD without thyromegaly Cardiac regular rate rhythm any extra  sounds or murmur heard Abdomen.  Soft nontender positive bowel sounds without rebound Respiratory effort normal no respiratory distress without wheeze Musculoskeletal. Comments.  Left hip tender to palpation and passive range of motion with associated swelling.  Left knee with mild effusion patella laxity.  Right hip tender with leg raise. Skin.  Incision site dressed Neurologic.  Alert oriented x3 follows commands.  Cranial nerve exam unremarkable.  Upper extremity motor 5/5.  Lower extremity motor limited proximally by pain.  ADF APF 4-5/5.  No sensory findings.  He/She  has had improvement in activity tolerance, balance, postural control as well as ability to compensate for deficits. He/She has had improvement in functional use RUE/LUE  and RLE/LLE as well as improvement in awareness.  Patient rolls in bed contact-guard.  Weightbearing as tolerated with knee immobilizer on right.  Therapist provided minimal assist for lifting patient's right hip and trunk from bed with patient using bedrail to hold self up for nurse.       Disposition: Discharged to acute care service for orthopedic surgical intervention   Diet: As tolerated/renal diet  Special Instructions: No driving smoking or alcohol  Weightbearing as tolerated with knee immobilizer right lower extremity  Medications at discharge as per attending team.  30-35 minutes were spent completing discharge summary and discharge planning     Follow-up Information     Raulkar, Clide Deutscher, MD Follow up.   Specialty: Physical Medicine and Rehabilitation Why: 01/13/22 please arrive at 1:20pm for 1:40pm follow-up, thank you! Contact information: [address] [place] [place] [address] [phone]         Lovell Sheehan, MD Follow up.   Specialty: Orthopedic Surgery Why: Call for  appointment Contact information: [place] [address] [phone]         Liana Gerold, MD Follow up.   Specialty: Nephrology Why: Call for appointment Contact information: [address] [place] [address] [phone]         Georgeanna Harrison, MD Follow up.   Specialty: Orthopedic Surgery Why: call for appointment Contact information: [place]  [id number] [phone]                 Signed: Cathlyn Parsons 10/16/2021, 5:25 AM

## 2021-10-13 NOTE — Progress Notes (Signed)
Pt's sugar reading-251 this evening.

## 2021-10-13 NOTE — Progress Notes (Signed)
Fecal Occult Blood- result is negative.

## 2021-10-13 NOTE — Progress Notes (Signed)
Physical Therapy Session Note  Patient Details  Name: Jayline Kilburg MRN: [id number] Date of Birth: [birth year]  Today's Date: 10/13/2021 PT Missed Time: 65 Minutes Missed Time Reason: Unavailable (Comment) (dialysis)   Skilled Therapeutic Interventions/Progress Updates:   Attempted to see pt for scheduled PT session. However, pt off unit for dialysis with plans to return by 12pm. Will attempt to make up time as schedule allows. 60 minutes missed of skilled physical therapy.   Therapy Documentation Precautions:  Precautions Precautions: Fall Precaution Comments: L AVF, R IJ perm-cath ( will need new dialysis cath) Required Braces or Orthoses: Knee Immobilizer - Right Knee Immobilizer - Right: On at all times Restrictions Weight Bearing Restrictions: No RLE Weight Bearing: Weight bearing as tolerated LLE Weight Bearing: Weight bearing as tolerated  Therapy/Group: Individual Therapy Alfonse Alpers PT, DPT   10/13/2021, 7:24 AM

## 2021-10-13 NOTE — Progress Notes (Signed)
Pt's Dexcom sugar reading 182 this morning. Pt will receive 1 unit of Novolog according to sliding scale.

## 2021-10-13 NOTE — Progress Notes (Signed)
PROGRESS NOTE   Subjective/Complaints: Ileus resolved on KUB- full diet ordered. Patient said she had a bad weekend due to her abdominal pain/constipation- requiring disimpaction   ROS: Constipation now improved  Objective:   DG Abd 1 View  Result Date: 10/13/2021 CLINICAL DATA:  Ileus EXAM: ABDOMEN - 1 VIEW COMPARISON:  10/13/2011 FINDINGS: Vascular catheter tip over the low right atrium. Nonobstructed gas pattern with mild stool. Left hip replacement. IMPRESSION: Nonobstructed gas pattern Electronically Signed   By: Donavan Foil M.D.   On: 10/13/2021 15:19   DG Abd Portable 1V  Result Date: 10/12/2021 CLINICAL DATA:  Constipation. EXAM: PORTABLE ABDOMEN - 1 VIEW COMPARISON:  10/11/2021 FINDINGS: 1121 hours. Interval decrease in gas and stool within the colon. Mild central small bowel distension is similar to prior. Bones are diffusely demineralized. IMPRESSION: Interval decrease in stool volume in the colon. Electronically Signed   By: Misty Stanley M.D.   On: 10/12/2021 14:09   Recent Labs    10/12/21 0649 10/13/21 0811  WBC 9.2 8.3  HGB 8.3* 8.2*  HCT 26.3* 26.4*  PLT 274 326    Recent Labs    10/12/21 0649 10/13/21 0811  NA 137 134*  K 4.4 5.9*  CL 97* 96*  CO2 25 23  GLUCOSE 166* 199*  BUN 36* 50*  CREATININE 6.08* 7.50*  CALCIUM 9.3 9.2     Intake/Output Summary (Last 24 hours) at 10/13/2021 1918 Last data filed at 10/13/2021 1055 Gross per 24 hour  Intake 120 ml  Output 2000 ml  Net -1880 ml        Physical Exam: Vital Signs Blood pressure (!) 156/68, pulse 85, temperature 98.2 F (36.8 C), resp. rate 16, height 5\' 7"  (1.702 m), weight 76.7 kg, SpO2 99 %.     General: awake, alert, appropriate, laying in bed- NAD, BMI 26.48 HENT: conjugate gaze; oropharynx moist CV: regular rate; no JVD Pulmonary: CTA B/L; no W/R/R- good air movement GI: much more soft, NT, ND, (+)BS- hypoactive  still Psychiatric: appropriate Neurological: Ox3 Musculoskeletal:        General: Swelling and tenderness present.     Cervical back: Normal range of motion and neck supple.     Right lower leg: Edema present.     Left lower leg: Edema present.     Comments: Left hip tender to palpation and PROM with associated swelling. Left knee with mild effusion, patella laxity. Right hip tender with leg raise  Right knee tender and swollen Skin:    Comments: Incision site dressing clean dry and intact.   Neurological:     Comments: Patient is alert.  No acute distress.  Oriented x3 and follows commands. Alert and oriented x 3. Normal insight and awareness. Intact Memory. Normal language and speech. Cranial nerve exam unremarkable. UE motor 5/5. LE motor limited proximally by pain. ADF/PF 4-5/5. No sensory findings.   Psychiatric:     Comments: Pleasant, slightly anxious  GU: stool incontinence in diaper   Assessment/Plan: 1. Functional deficits which require 3+ hours per day of interdisciplinary therapy in a comprehensive inpatient rehab setting. Physiatrist is providing close team supervision and 24 hour management of active medical  problems listed below. Physiatrist and rehab team continue to assess barriers to discharge/monitor patient progress toward functional and medical goals  Care Tool:  Bathing  Bathing activity did not occur: Safety/medical concerns Body parts bathed by patient: Right arm, Left arm, Chest, Abdomen, Face         Bathing assist Assist Level: Supervision/Verbal cueing (EOB)     Upper Body Dressing/Undressing Upper body dressing   What is the patient wearing?: Pull over shirt    Upper body assist Assist Level: Supervision/Verbal cueing (EOB)    Lower Body Dressing/Undressing Lower body dressing      What is the patient wearing?: Incontinence brief     Lower body assist Assist for lower body dressing: Maximal Assistance - Patient 25 - 49%      Toileting Toileting    Toileting assist Assist for toileting: Maximal Assistance - Patient 25 - 49%     Transfers Chair/bed transfer  Transfers assist     Chair/bed transfer assist level: 2 Helpers     Locomotion Ambulation   Ambulation assist   Ambulation activity did not occur: Safety/medical concerns (weakness, fatigue, pain, anxiety)          Walk 10 feet activity   Assist  Walk 10 feet activity did not occur: Safety/medical concerns (weakness, fatigue, pain, anxiety)        Walk 50 feet activity   Assist Walk 50 feet with 2 turns activity did not occur: Safety/medical concerns (weakness, fatigue, pain, anxiety)         Walk 150 feet activity   Assist Walk 150 feet activity did not occur: Safety/medical concerns (weakness, fatigue, pain, anxiety)         Walk 10 feet on uneven surface  activity   Assist Walk 10 feet on uneven surfaces activity did not occur: Safety/medical concerns (weakness, fatigue, pain, anxiety)         Wheelchair     Assist Is the patient using a wheelchair?: Yes Type of Wheelchair: Manual Wheelchair activity did not occur: Safety/medical concerns (weakness, fatigue, pain, anxiety)         Wheelchair 50 feet with 2 turns activity    Assist    Wheelchair 50 feet with 2 turns activity did not occur: Safety/medical concerns (weakness, fatigue, pain, anxiety)       Wheelchair 150 feet activity     Assist  Wheelchair 150 feet activity did not occur: Safety/medical concerns (weakness, fatigue, pain, anxiety)       Blood pressure (!) 156/68, pulse 85, temperature 98.2 F (36.8 C), resp. rate 16, height 5\' 7"  (1.702 m), weight 76.7 kg, SpO2 99 %.    Medical Problem List and Plan: 1. Functional deficits secondary to nondisplaced fracture right inferior pubic ramus as well as a displaced foreshortened fracture through the left femoral neck/intertrochanteric area.  Status post anterior hip  hemiarthroplasty 09/29/2021.   -Weightbearing as tolerated with anterior total hip precautions.                                  -Conservative care right inferior pubic ramus fracture, weightbearing as tolerated             -patient may  shower             -ELOS/Goals: 10-14 days, supervision to min assist goals  Discussed with team plan for surgery on Thursday.   Con't CIR  -HFU scheduled 2.  Antithrombotics: -DVT/anticoagulation:  Mechanical: Antiembolism stockings, thigh (TED hose) Bilateral lower extremities check vascular study.   -should be able to begin lovenox today as permacath placed            -antiplatelet therapy: N/A 3. Femur fracture pain: continue Flector patch,Robaxin 750 mg 4 times daily, hydrocodone and oxycodone as needed             -pt having significant spasms in left thigh.             -add kpad to help with spasms  -schedule tylenol 1000mg  TID 4. Anxiety: Instructed on deep breathing techniques. Provide emotional support.  Melatonin as needed             -antipsychotic agents: N/A 5. Neuropsych: This patient is capable of making decisions on her own behalf. 6. Skin/Wound Care: Routine skin checks 7. Fluids/Electrolytes/Nutrition: Routine in and outs with follow-up chemistries 8.  End-stage renal disease/hemodialysis.  Permacath exchanged 10/03/2021.    -  Follow-up hemodialysis per Dr. [name] -HD later in day to allow participation in therapies during the day 9.  Acute on chronic anemia.  Has had transfusions already x 2. Hgb has stabilized in 7 range..  Continue iron supplement.              -epo per nephrology 10.  Diabetes mellitus with peripheral neuropathy. Placed order that she may use her Dexcom 6. Hemoglobin A1c 6.2.  SSI as prior to admission. Discuss Qutenza.  11.  Hypertension.  ContinueCozaar 50 mg daily, 12.  Hyperparathyroidism of renal origin.  Plan outpatient parathyroidectomy per Dr.Lateef 13.  Overweight.  BMI 29.11.  Dietary follow-up 14.  Cushingoid?--outpt work up 43. Constipation: resolved. Decrease colace to daily prn. D/c senna.  16. Right quadriceps tear: plan for surgery Thursday- discussed with team. Precautions as per ortho.  17. Gas: add simethicone 18. Ileus: resolved, regular diet resumed.  19. Abd spasms: resolved 20. Black tarry stools: resolved. Hgb reviewed and stable.     LOS: 7 days A FACE TO FACE EVALUATION WAS PERFORMED  Izora Ribas 10/13/2021, 7:18 PM

## 2021-10-13 NOTE — Progress Notes (Signed)
[facility] Progress Note   Subjective:   Patient seen and examined at bedside in dialysis.  Tolerating well so far. Surgery planned for tomorrow.  Denies CP, SOB, abdominal pain, and n/v/d.  Reports disimpaction over the weekend.    Objective Vitals:   10/13/21 0356 10/13/21 0753 10/13/21 0800 10/13/21 0830  BP: (!) 161/73 (!) 172/80 (!) 171/76 (!) 141/69  Pulse: 91 88 89 83  Resp: 18 18    Temp: (!) 97.5 F (36.4 C) 97.6 F (36.4 C)    TempSrc: Oral Temporal    SpO2: 98% 98%    Weight:  78.7 kg    Height:       Physical Exam General:well appearing female in NAD Heart:RRR, no mrg Lungs:CTAB, nml WOB Abdomen:soft, NTND Extremities:trace LE edema Dialysis Access: R IJ TDC in use   Filed Weights   10/10/21 1444 10/10/21 1818 10/13/21 0753  Weight: 80.6 kg 78.6 kg 78.7 kg    Intake/Output Summary (Last 24 hours) at 10/13/2021 0840 Last data filed at 10/13/2021 0730 Gross per 24 hour  Intake 960 ml  Output --  Net 960 ml    Additional Objective Labs: Basic Metabolic Panel: Recent Labs  Lab 10/10/21 1459 10/12/21 0649  NA 134* 137  K 4.4 4.4  CL 94* 97*  CO2 26 25  GLUCOSE 206* 166*  BUN 51* 36*  CREATININE 7.16* 6.08*  CALCIUM 8.7* 9.3  PHOS 4.3 4.3   Liver Function Tests: Recent Labs  Lab 10/10/21 1459 10/12/21 0649  ALBUMIN 2.9* 2.8*   CBC: Recent Labs  Lab 10/10/21 1459 10/12/21 0649  WBC 11.5* 9.2  NEUTROABS  --  7.4  HGB 8.1* 8.3*  HCT 24.4* 26.3*  MCV 97.6 98.9  PLT 266 274   CBG: Recent Labs  Lab 10/07/21 0600 10/08/21 0620 10/08/21 1137 10/08/21 2016 10/09/21 1252  GLUCAP 168* 174* 198* 172* 147*   Studies/Results: DG Abd 1 View  Result Date: 10/11/2021 CLINICAL DATA:  Abdominal spasm. Black tarry stools and constipation after hip surgery. Evaluate for ileus or constipation. EXAM: ABDOMEN - 1 VIEW COMPARISON:  06/09/2019 FINDINGS: There is mild gaseous distension of the large and small bowel loops. A moderate  stool burden is also identified within the colon. No signs of bowel obstruction. IMPRESSION: Mild gaseous distension of the large and small bowel loops, likely reflecting mild ileus. Electronically Signed   By: Kerby Moors M.D.   On: 10/11/2021 11:02   DG Abd Portable 1V  Result Date: 10/12/2021 CLINICAL DATA:  Constipation. EXAM: PORTABLE ABDOMEN - 1 VIEW COMPARISON:  10/11/2021 FINDINGS: 1121 hours. Interval decrease in gas and stool within the colon. Mild central small bowel distension is similar to prior. Bones are diffusely demineralized. IMPRESSION: Interval decrease in stool volume in the colon. Electronically Signed   By: Misty Stanley M.D.   On: 10/12/2021 14:09    Medications:   acetaminophen  1,000 mg Oral TID   vitamin C  500 mg Oral Daily   Chlorhexidine Gluconate Cloth  6 each Topical BID   Chlorhexidine Gluconate Cloth  6 each Topical Q0600   darbepoetin (ARANESP) injection - DIALYSIS  40 mcg Intravenous Q Fri-HD   diclofenac  1 patch Transdermal BID   diclofenac Sodium  2 g Topical QID   enoxaparin (LOVENOX) injection  30 mg Subcutaneous Q24H   feeding supplement (NEPRO CARB STEADY)  237 mL Oral BID BM   ferric citrate  420 mg Oral TID WC   insulin aspart  0-6 Units Subcutaneous TID WC   losartan  50 mg Oral Daily   methocarbamol  750 mg Oral QID   multivitamin  1 tablet Oral QHS   Vitamin D (Ergocalciferol)  50,000 Units Oral Q7 days    Dialysis Orders:  MWF [place] [phone])  3h 52min  81kg   Hep 3000 then 800u/hr  RIJ TDC       Assessment/ Plan: Debility - rehab per CIR Tendon rupture - RLE. Seen by orthopedics. For surgery Tomorrow.  L hip fracture/ R pelvic ramus fx - sp L hemiarthroplasty on 09/29/21 at [facility] ESRD - on HD mwf. Continue on schedule. Next HD today.  BP/ volume - BP improving with HD. Continue home meds.  Under dry wt, UF as tolerated.  DM2 - per pmd Anemia ckd - Last Hgb 8.3. S/p transfusion. ESA started,  Aranesp 40 on 2/10.   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  Continue binders.  Dispo: going back inpatient today for surgery tomorrow then to return to CIR.   Jen Mow, PA-C [facility] 10/13/2021,8:40 AM  LOS: 7 days

## 2021-10-14 NOTE — Progress Notes (Signed)
Patient refuse Diclofenac patch , states she does not need it.

## 2021-10-14 NOTE — Progress Notes (Signed)
Physical Therapy Session Note  Patient Details  Name: [name] MRN: [id number] Date of Birth: 09-16-1965  Today's Date: 10/14/2021 PT Individual Time: 0930-1015 PT Individual Time Calculation (min): 45 min   Short Term Goals: Week 1:  PT Short Term Goal 1 (Week 1): Pt will transfer sit<>stand with LRAD and max A of 1 PT Short Term Goal 2 (Week 1): Pt will transfer bed<>chair with LRAD and max A of 1 PT Short Term Goal 3 (Week 1): Pt will initate gait training  Skilled Therapeutic Interventions/Progress Updates:   Received pt semi-reclined in bed with L knee bent to 90 degrees and very excited about her progress. Pt agreeable to PT treatment and reported pain 8/10 in L thigh and R knee with movement but denied any pain at rest (premedicated). Session with emphasis on hygiene management, dressing, bed mobility, generalized strengthening and ROM, and improved activity tolerance. Pt reported being soiled and requesting to get cleaned up. Rolled L/R with min A and use of bedrails and doffed dirty brief with total A and performed peri-care in sidelying dependently. Donned clean brief and placed new chuck pad with +2 assist due to availability. Pt then rolled L/R with min A and use of bedrails and required +2 assist to pull pants over hips. Donned R knee immobilizer with total A +2 due to availability and pt scooted to [facility] with min A +2 and use of Trendelenburg bed position. Due to time restrictions and pt requiring significantly increased time with set up for transfers, unable to transfer to [facility]. However, did set pt up for success to transfer to [facility] with OT in next session. Pt performed the following exercises with emphasis on LE strength and ROM: -hip abduction on RLE x10 AAROM -ankle pumps x20 bilaterally -LLE SAQ x10 AAROM -heel slides AAROM on LLE using gait belt x5 Educated pt on technique for performing AAROM heel slides on LLE with caution to keep pelvis in neutral and avoid excessive hip  abduction - pt demonstrated understanding and left semi-reclined in bed, needs within reach, and bed alarm on performing heel slides.   Therapy Documentation Precautions:  Precautions Precautions: Fall Precaution Comments: L AVF, R IJ perm-cath ( will need new dialysis cath) Required Braces or Orthoses: Knee Immobilizer - Right Knee Immobilizer - Right: On at all times Restrictions Weight Bearing Restrictions: No RLE Weight Bearing: Weight bearing as tolerated LLE Weight Bearing: Weight bearing as tolerated  Therapy/Group: Individual Therapy Alfonse Alpers PT, DPT   10/14/2021, 7:27 AM

## 2021-10-14 NOTE — Progress Notes (Signed)
Occupational Therapy Session Note  Patient Details  Name: Kenishia Plack MRN: [id number] Date of Birth: [birth year]  Today's Date: 10/14/2021 OT Missed Time: 58 Minutes Missed Time Reason: Pain   Pt received upright in bed, c/o increased pain on R knee, with nurse aware and in room providing meds. Therapist offered bed level tasks, repositioning, OOB tasks, with max encouragement to complete slideboard transfer to chair, however pt declined stating "I'm going to pass today". Pt left in same position, with bed alarm on and all needs in reach at end of session.  Sael Furches E Lyndol Vanderheiden 10/14/2021, 7:46 AM

## 2021-10-14 NOTE — Progress Notes (Addendum)
[name] Progress Note   Subjective:   Patient seen and examined at bedside.  Tolerating dialysis well, "its the one thing going perfect". Waiting to start PT.  Denies CP, SOB, abdominal pain and n/v/d.   Objective Vitals:   10/13/21 1055 10/13/21 1412 10/13/21 2141 10/14/21 0517  BP: (!) 149/75 (!) 156/68 (!) 132/58 (!) 154/69  Pulse: 84 85 88 90  Resp: 20 16 18 17   Temp: (!) 97.1 F (36.2 C) 98.2 F (36.8 C) 98.4 F (36.9 C) 98.1 F (36.7 C)  TempSrc: Temporal  Oral Oral  SpO2: 98% 99% 96% 97%  Weight: 76.7 kg     Height:       Physical Exam General:well appearing female in NAD Heart:RRR, no mrg Lungs:CTAB,nml WOB Abdomen:soft, NTND Extremities:no LE edema Dialysis Access: R IJ [facility]   Filed Weights   10/10/21 1818 10/13/21 0753 10/13/21 1055  Weight: 78.6 kg 78.7 kg 76.7 kg    Intake/Output Summary (Last 24 hours) at 10/14/2021 0926 Last data filed at 10/13/2021 1055 Gross per 24 hour  Intake --  Output 2000 ml  Net -2000 ml    Additional Objective Labs: Basic Metabolic Panel: Recent Labs  Lab 10/10/21 1459 10/12/21 0649 10/13/21 0811  NA 134* 137 134*  K 4.4 4.4 5.9*  CL 94* 97* 96*  CO2 26 25 23   GLUCOSE 206* 166* 199*  BUN 51* 36* 50*  CREATININE 7.16* 6.08* 7.50*  CALCIUM 8.7* 9.3 9.2  PHOS 4.3 4.3 4.3   Liver Function Tests: Recent Labs  Lab 10/10/21 1459 10/12/21 0649 10/13/21 0811  ALBUMIN 2.9* 2.8* 3.0*   CBC: Recent Labs  Lab 10/10/21 1459 10/12/21 0649 10/13/21 0811  WBC 11.5* 9.2 8.3  NEUTROABS  --  7.4  --   HGB 8.1* 8.3* 8.2*  HCT 24.4* 26.3* 26.4*  MCV 97.6 98.9 100.8*  PLT 266 274 326    CBG: Recent Labs  Lab 10/08/21 0620 10/08/21 1137 10/08/21 2016 10/09/21 1252  GLUCAP 174* 198* 172* 147*    Studies/Results: DG Abd 1 View  Result Date: 10/13/2021 CLINICAL DATA:  Ileus EXAM: ABDOMEN - 1 VIEW COMPARISON:  10/13/2011 FINDINGS: Vascular catheter tip over the low right atrium. Nonobstructed  gas pattern with mild stool. Left hip replacement. IMPRESSION: Nonobstructed gas pattern Electronically Signed   By: Donavan Foil M.D.   On: 10/13/2021 15:19   DG Abd Portable 1V  Result Date: 10/12/2021 CLINICAL DATA:  Constipation. EXAM: PORTABLE ABDOMEN - 1 VIEW COMPARISON:  10/11/2021 FINDINGS: 1121 hours. Interval decrease in gas and stool within the colon. Mild central small bowel distension is similar to prior. Bones are diffusely demineralized. IMPRESSION: Interval decrease in stool volume in the colon. Electronically Signed   By: Misty Stanley M.D.   On: 10/12/2021 14:09    Medications:   acetaminophen  1,000 mg Oral TID   vitamin C  500 mg Oral Daily   Chlorhexidine Gluconate Cloth  6 each Topical BID   Chlorhexidine Gluconate Cloth  6 each Topical Q0600   darbepoetin (ARANESP) injection - DIALYSIS  40 mcg Intravenous Q Fri-HD   diclofenac  1 patch Transdermal BID   diclofenac Sodium  2 g Topical QID   enoxaparin (LOVENOX) injection  30 mg Subcutaneous Q24H   ferric citrate  420 mg Oral TID WC   insulin aspart  0-6 Units Subcutaneous TID WC   losartan  50 mg Oral Daily   methocarbamol  750 mg Oral QID   multivitamin  1 tablet Oral QHS   Vitamin D (Ergocalciferol)  50,000 Units Oral Q7 days    Dialysis Orders: MWF [place] [phone])  3h 74min  81kg   Hep 3000 then 800u/hr  RIJ TDC     Assessment/ Plan: Debility - rehab per CIR Tendon rupture - RLE. Seen by orthopedics. For surgery Thursday L hip fracture/ R pelvic ramus fx - sp L hemiarthroplasty on 09/29/21 at [facility] ESRD - on HD mwf. Continue on schedule. Next HD tomorrow. BP/ volume - BP improving with HD. Continue home meds.  Under dry wt, UF as tolerated.  DM2 - per pmd Anemia ckd - Last Hgb 8.2. S/p transfusion. ESA started,  Aranesp 40 on 2/10.  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  Continue binders.  9. Nutrition - currently on regular diet w/fluid restrictions.  Follow labs if K starts trending up  change to renal diet.   Jen Mow, PA-C [facility] 10/14/2021,9:26 AM  LOS: 8 days

## 2021-10-14 NOTE — Progress Notes (Signed)
Patient Dexcom reading 218, no HS coverage

## 2021-10-14 NOTE — Progress Notes (Signed)
PROGRESS NOTE   Subjective/Complaints: She was very frustrated that she was told she may be discharged home yesterday- she is happy to be able to stay here until her surgery Tolerated regular diet last night   ROS: Constipation now improved, +pain in bilateral lower extremities.   Objective:   DG Abd 1 View  Result Date: 10/13/2021 CLINICAL DATA:  Ileus EXAM: ABDOMEN - 1 VIEW COMPARISON:  10/13/2011 FINDINGS: Vascular catheter tip over the low right atrium. Nonobstructed gas pattern with mild stool. Left hip replacement. IMPRESSION: Nonobstructed gas pattern Electronically Signed   By: Donavan Foil M.D.   On: 10/13/2021 15:19   DG Abd Portable 1V  Result Date: 10/12/2021 CLINICAL DATA:  Constipation. EXAM: PORTABLE ABDOMEN - 1 VIEW COMPARISON:  10/11/2021 FINDINGS: 1121 hours. Interval decrease in gas and stool within the colon. Mild central small bowel distension is similar to prior. Bones are diffusely demineralized. IMPRESSION: Interval decrease in stool volume in the colon. Electronically Signed   By: Misty Stanley M.D.   On: 10/12/2021 14:09   Recent Labs    10/12/21 0649 10/13/21 0811  WBC 9.2 8.3  HGB 8.3* 8.2*  HCT 26.3* 26.4*  PLT 274 326    Recent Labs    10/12/21 0649 10/13/21 0811  NA 137 134*  K 4.4 5.9*  CL 97* 96*  CO2 25 23  GLUCOSE 166* 199*  BUN 36* 50*  CREATININE 6.08* 7.50*  CALCIUM 9.3 9.2    No intake or output data in the 24 hours ending 10/14/21 1106       Physical Exam: Vital Signs Blood pressure (!) 154/69, pulse 90, temperature 98.1 F (36.7 C), temperature source Oral, resp. rate 17, height 5\' 7"  (1.702 m), weight 76.7 kg, SpO2 97 %.     General: awake, alert, appropriate, laying in bed- NAD, BMI 26.48 HENT: conjugate gaze; oropharynx moist CV: regular rate; no JVD Pulmonary: CTA B/L; no W/R/R- good air movement GI: much more soft, NT, ND, (+)BS- hypoactive  still Psychiatric: appropriate Neurological: Ox3 Musculoskeletal:        General: Swelling and tenderness present.     Cervical back: Normal range of motion and neck supple.     Right lower leg: Edema present.     Left lower leg: Edema present.     Comments: Left hip tender to palpation and PROM with associated swelling. Left knee with mild effusion, patella laxity. Right hip tender with leg raise  Active range of motion by patient with good improvements Right knee tender and swollen Skin:    Comments: Incision site dressing clean dry and intact.   Neurological:     Comments: Patient is alert.  No acute distress.  Oriented x3 and follows commands. Alert and oriented x 3. Normal insight and awareness. Intact Memory. Normal language and speech. Cranial nerve exam unremarkable. UE motor 5/5. LE motor limited proximally by pain. ADF/PF 4-5/5. No sensory findings.   Psychiatric:     Comments: Pleasant, slightly anxious  GU: stool incontinence in diaper   Assessment/Plan: 1. Functional deficits which require 3+ hours per day of interdisciplinary therapy in a comprehensive inpatient rehab setting. Physiatrist is providing close team  supervision and 24 hour management of active medical problems listed below. Physiatrist and rehab team continue to assess barriers to discharge/monitor patient progress toward functional and medical goals  Care Tool:  Bathing  Bathing activity did not occur: Safety/medical concerns Body parts bathed by patient: Right arm, Left arm, Chest, Abdomen, Face         Bathing assist Assist Level: Supervision/Verbal cueing (EOB)     Upper Body Dressing/Undressing Upper body dressing   What is the patient wearing?: Pull over shirt    Upper body assist Assist Level: Supervision/Verbal cueing (EOB)    Lower Body Dressing/Undressing Lower body dressing      What is the patient wearing?: Incontinence brief     Lower body assist Assist for lower body  dressing: Maximal Assistance - Patient 25 - 49%     Toileting Toileting    Toileting assist Assist for toileting: Maximal Assistance - Patient 25 - 49%     Transfers Chair/bed transfer  Transfers assist     Chair/bed transfer assist level: 2 Helpers     Locomotion Ambulation   Ambulation assist   Ambulation activity did not occur: Safety/medical concerns (weakness, fatigue, pain, anxiety)          Walk 10 feet activity   Assist  Walk 10 feet activity did not occur: Safety/medical concerns (weakness, fatigue, pain, anxiety)        Walk 50 feet activity   Assist Walk 50 feet with 2 turns activity did not occur: Safety/medical concerns (weakness, fatigue, pain, anxiety)         Walk 150 feet activity   Assist Walk 150 feet activity did not occur: Safety/medical concerns (weakness, fatigue, pain, anxiety)         Walk 10 feet on uneven surface  activity   Assist Walk 10 feet on uneven surfaces activity did not occur: Safety/medical concerns (weakness, fatigue, pain, anxiety)         Wheelchair     Assist Is the patient using a wheelchair?: Yes Type of Wheelchair: Manual Wheelchair activity did not occur: Safety/medical concerns (weakness, fatigue, pain, anxiety)         Wheelchair 50 feet with 2 turns activity    Assist    Wheelchair 50 feet with 2 turns activity did not occur: Safety/medical concerns (weakness, fatigue, pain, anxiety)       Wheelchair 150 feet activity     Assist  Wheelchair 150 feet activity did not occur: Safety/medical concerns (weakness, fatigue, pain, anxiety)       Blood pressure (!) 154/69, pulse 90, temperature 98.1 F (36.7 C), temperature source Oral, resp. rate 17, height 5\' 7"  (1.702 m), weight 76.7 kg, SpO2 97 %.    Medical Problem List and Plan: 1. Functional deficits secondary to nondisplaced fracture right inferior pubic ramus as well as a displaced foreshortened fracture through  the left femoral neck/intertrochanteric area.  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   -Weightbearing as tolerated with anterior total hip precautions.                                  -Conservative care right inferior pubic ramus fracture, weightbearing as tolerated             -patient may  shower             -ELOS/Goals: 10-14 days, supervision to min assist goals  Discussed with team plan for surgery  on Thursday. NPO at midnight on Wednesday  Continue CIR  -HFU scheduled 2.  Antithrombotics: -DVT/anticoagulation:  Mechanical: Antiembolism stockings, thigh (TED hose) Bilateral lower extremities check vascular study.   -should be able to begin lovenox today as permacath placed            -antiplatelet therapy: N/A 3. Femur fracture pain: continue Flector patch,Robaxin 750 mg 4 times daily, hydrocodone and oxycodone as needed             -pt having significant spasms in left thigh.             -add kpad to help with spasms  -schedule tylenol 1000mg  TID 4. Anxiety: Instructed on deep breathing techniques. Provide emotional support.  Melatonin as needed             -antipsychotic agents: N/A 5. Neuropsych: This patient is capable of making decisions on her own behalf. 6. Skin/Wound Care: Routine skin checks 7. Fluids/Electrolytes/Nutrition: Routine in and outs with follow-up chemistries 8.  End-stage renal disease/hemodialysis.  Permacath exchanged 10/03/2021.    -  Follow-up hemodialysis per Dr. [name] -HD later in day to allow participation in therapies during the day 9.  Acute on chronic anemia.  Has had transfusions already x 2. Hgb has stabilized in 7 range..  Continue iron supplement.              -epo per nephrology 10.  Diabetes mellitus with peripheral neuropathy. Placed order that she may use her Dexcom 6. Hemoglobin A1c 6.2.  SSI as prior to admission. Discuss Qutenza.  11.  Hypertension.  ContinueCozaar 50 mg daily, 12.  Hyperparathyroidism of renal origin.  Plan  outpatient parathyroidectomy per Dr.Lateef 13.  Overweight.  BMI 29.11.  Dietary follow-up 14. Cushingoid?--outpt work up 63. Constipation: resolved. Decrease colace to daily prn. D/c senna. Discussed benefits of high fiber foods 16. Right quadriceps tear: plan for surgery Thursday- discussed with team. Precautions as per ortho. NPO at midnight on Wednesday.  17. Gas: continue simethicone 18. Ileus: resolved, regular diet resumed -tolerating.  19. Abd spasms: resolved 20. Black tarry stools: discussed heme occult negative last night. Hgb reviewed and stable.     LOS: 8 days A FACE TO FACE EVALUATION WAS PERFORMED  Ridhima Golberg P Kenslei Hearty 10/14/2021, 11:06 AM

## 2021-10-15 LAB — GLUCOSE, CAPILLARY: Glucose-Capillary: 142 mg/dL — ABNORMAL HIGH (ref 70–99)

## 2021-10-15 MED ORDER — HEPARIN SODIUM (PORCINE) 1000 UNIT/ML IJ SOLN
INTRAMUSCULAR | Status: AC
  Filled 2021-10-15: qty 1

## 2021-10-15 NOTE — Patient Care Conference (Signed)
Inpatient RehabilitationTeam Conference and Plan of Care Update Date: 10/15/2021   Time: 11:37 AM    Patient Name: [name]      Medical Record Number: [id number]  Date of Birth: [birth year] Sex: Female         Room/Bed: 5C08C/5C08C-01 Payor Info: Payor: MEDICARE / Plan: MEDICARE PART A AND B / Product Type: *No Product type* /    Admit Date/Time:  10/06/2021  2:48 PM  Primary Diagnosis:  Femur fracture [facility])  Hospital Problems: Principal Problem:   Femur fracture [facility])    Expected Discharge Date: Expected Discharge Date: 10/16/21  Team Members Present: Physician leading conference: Dr. Leeroy Cha Social Worker Present: Erlene Quan, [place] Nurse Present: Dorien Chihuahua, RN PT Present: Becky Sax, PT OT Present: Other (comment) [facility] Alphonsa Gin, OT) [place] Coordinator present : Gunnar Fusi, SLP     Current Status/Progress Goal Weekly Team Focus  Bowel/Bladder   Dialysis x 3 days/week, oliguria, Incontinent of stool, LBM 10/14/21  Restore continence of Bowel, continue Dialysis per schedule days  Assess toileting needs QS and prn   Swallow/Nutrition/ Hydration             ADL's   has progressed to complete self-care EOB at the mod A level for LB, set up A UBD, bed level LB dressing with mod A +2, toileting total A, has refused OOB transfers  CGA  activity tolerance, pain management, functional transfers   Mobility   bed mobility max A, refused transfers OOB  supervision, CGA gait, and min A steps  OOB mobility, functional mobility, bed mobility, pain management, generalized strengthening in preparation for surgery   Communication             Safety/Cognition/ Behavioral Observations            Pain   Left lower extremity surgical incision and right pre  surgical knee  Pain < 3/10  Assess PAIN QS/PRN, make as comfortable as ;lsslbllr   Skin   Surgical incison dressing intact to left hip, no apparent drainage from area, Surgical procedure scedule for right knee  tear on 10/16/20           Discharge Planning:  discharging home with spouse. Intermittent Assistance due to still working.   Team Discussion: Patient awaits surgery for tendon repair. Patient with poor participation with therapy due pain and low activity tolerance. Patient remains incontinent of bowel with oliguria/HD.  Patient on target to meet rehab goals: Patient able to complete self care at [facility] with set up for upper body care but requires mod assist for lower body care. Needs total assist for toileting.   *See Care Plan and progress notes for long and short-term goals.   Revisions to Treatment Plan:  N/A   Teaching Needs: Safety, transfers, toileting, medication management, skin care, etc  Current Barriers to Discharge: Decreased caregiver support and Home enviroment access/layout  Possible Resolutions to Barriers: Family education DME: hospital bed, hoyer lift, bed pan     Medical Summary Current Status: right quadriceps tear, pain in bilateral lower extremities, hip inscision  Barriers to Discharge: Medical stability;Wound care  Barriers to Discharge Comments: right quadriceps tear, pain in bilateral lower extremities, hip inscision Possible Resolutions to Barriers/Weekly Focus: continue tylenol, continue to monitor hip incision daily, remove staples today, surgery tomorrow for quad tear   Continued Need for Acute Rehabilitation Level of Care: The patient requires daily medical management by a physician with specialized training in physical medicine and rehabilitation for the following  reasons: Direction of a multidisciplinary physical rehabilitation program to maximize functional independence : Yes Medical management of patient stability for increased activity during participation in an intensive rehabilitation regime.: Yes Analysis of laboratory values and/or radiology reports with any subsequent need for medication adjustment and/or medical intervention. : Yes   I  attest that I was present, lead the team conference, and concur with the assessment and plan of the team.   Dorien Chihuahua B 10/15/2021, 4:20 PM

## 2021-10-15 NOTE — Progress Notes (Addendum)
Occupational Therapy Discharge Summary  Patient Details  Name: [name] MRN: [id number] Date of Birth: 07-22-1966  Patient has met 0 of 5 long term goals due to improved balance and postural control.  Patient to discharge at overall Mod A level for bed level self-care.  Patient's care partner unavailable to provide the necessary physical assistance at discharge.    Reasons goals not met: not met due to minimal participation, awaiting surgical procedure 2/16 with pt very limited by pain, decreased strength  Recommendation:  Patient will benefit from ongoing skilled OT services in skilled nursing facility setting to continue to advance functional skills in the area of BADL and Reduce care partner burden.  Equipment: Bed pan  Reasons for discharge: change in medical status and plan for surgery to repair quad tendon repair  Patient/family agrees with progress made and goals achieved: Yes  OT Discharge Precautions/Restrictions  Precautions Precautions: Fall Required Braces or Orthoses: Knee Immobilizer - Right Knee Immobilizer - Right: On at all times Restrictions Weight Bearing Restrictions: Yes RLE Weight Bearing: Weight bearing as tolerated LLE Weight Bearing: Weight bearing as tolerated ADL ADL Eating: Set up Where Assessed-Eating: Edge of bed Grooming: Setup Where Assessed-Grooming: Edge of bed Upper Body Bathing: Setup, Supervision/safety Where Assessed-Upper Body Bathing: Edge of bed Lower Body Bathing: Maximal assistance Where Assessed-Lower Body Bathing: Bed level Upper Body Dressing: Supervision/safety Where Assessed-Upper Body Dressing: Edge of bed Lower Body Dressing: Moderate assistance Where Assessed-Lower Body Dressing: Bed level Toileting: Dependent Where Assessed-Toileting: Bedside Commode Toilet Transfer: Dependent Toilet Transfer Equipment: Bedside commode Tub/Shower Transfer: Unable to assess Tub/Shower Transfer Method: Unable to assess Press photographer: Unable to assess Social research officer, government Method: Unable to assess Vision Baseline Vision/History: 1 Wears glasses Patient Visual Report: No change from baseline Vision Assessment?: No apparent visual deficits Perception  Perception: Within Functional Limits Praxis Praxis: Intact Cognition Overall Cognitive Status: Within Functional Limits for tasks assessed Arousal/Alertness: Awake/alert Orientation Level: Oriented X4 Year: 2023 Month: February Day of Week: Correct Selective Attention: Appears intact Memory: Appears intact Immediate Memory Recall: Sock;Bed;Blue Memory Recall Sock: Without Cue Memory Recall Blue: Without Cue Memory Recall Bed: Without Cue Awareness: Appears intact Problem Solving: Appears intact Safety/Judgment: Appears intact Comments: very anxious and self-limiting Sensation Sensation Light Touch: Appears Intact Hot/Cold: Appears Intact Proprioception: Appears Intact Coordination Gross Motor Movements are Fluid and Coordinated: No Fine Motor Movements are Fluid and Coordinated: Yes Finger Nose Finger Test: [facility] Motor  Motor Motor: Abnormal postural alignment and control Motor - Skilled Clinical Observations: generalized weakness/deconditioning, high pain levels, and fear of movement Mobility  Bed Mobility Bed Mobility: Rolling Right;Rolling Left;Supine to Sit;Sit to Supine Rolling Right: Minimal Assistance - Patient > 75% Rolling Left: Minimal Assistance - Patient > 75% Supine to Sit: Maximal Assistance - Patient - Patient 25-49% Sit to Supine: Maximal Assistance - Patient 25-49% Transfers Sit to Stand: 2 Helpers;Dependent - mechanical lift Stand to Sit: 2 Helpers;Dependent - mechanical lift  Trunk/Postural Assessment  Cervical Assessment Cervical Assessment: Within Functional Limits Thoracic Assessment Thoracic Assessment: Exceptions to [facility] (rounded shoulder) Lumbar Assessment Lumbar Assessment: Exceptions to [facility] (posterior  pelvic tilt) Postural Control Postural Control: Within Functional Limits  Balance Balance Balance Assessed: Yes Static Sitting Balance Static Sitting - Balance Support: Bilateral upper extremity supported;Feet supported Static Sitting - Level of Assistance: 6: Modified independent (Device/Increase time) Dynamic Sitting Balance Dynamic Sitting - Balance Support: Feet supported;No upper extremity supported Dynamic Sitting - Level of Assistance: 5: Stand by assistance (supervision) Static Standing  Balance Static Standing - Balance Support: Bilateral upper extremity supported Static Standing - Level of Assistance: 1: +2 Total assist Extremity/Trunk Assessment RUE Assessment RUE Assessment: Exceptions to [facility] General Strength Comments: generalized weakness LUE Assessment LUE Assessment: Exceptions to [facility] General Strength Comments: generalized weakness   Jonie Burdell E Sherica Paternostro 10/15/2021, 7:42 AM

## 2021-10-15 NOTE — Progress Notes (Signed)
Inpatient Rehabilitation Discharge Medication Review by a Pharmacist  A complete drug regimen review was completed for this patient to identify any potential clinically significant medication issues.  High Risk Drug Classes Is patient taking? Indication by Medication  Antipsychotic No   Anticoagulant Yes Lovenox for DVT px  Antibiotic No   Opioid Yes Oxycodone/hydrocodone for pain  Antiplatelet No   Hypoglycemics/insulin Yes SSI for DM  Vasoactive Medication Yes Losartan for HTN  Chemotherapy No   Other Yes Aurexia - phos binder Robaxin - spasms Aranesp - anemia     Type of Medication Issue Identified Description of Issue Recommendation(s)  Drug Interaction(s) (clinically significant)     Duplicate Therapy     Allergy     No Medication Administration End Date     Incorrect Dose     Additional Drug Therapy Needed     Significant med changes from prior encounter (inform family/care partners about these prior to discharge).    Other       Clinically significant medication issues were identified that warrant physician communication and completion of prescribed/recommended actions by midnight of the next day:  No  Name of provider notified for urgent issues identified:   Provider Method of Notification:   Pharmacist comments:   Time spent performing this drug regimen review (minutes):  3min   Onnie Boer, PharmD, [place], AAHIVP, CPP Infectious Disease Pharmacist 10/15/2021 8:44 AM

## 2021-10-15 NOTE — Progress Notes (Signed)
Patient is scheduled for surgical intervention per orthopedic services 10/16/2021 for quad tendon tear.  Patient will need to be discharged back to acute care services under the care of Dr. Mable Fill

## 2021-10-15 NOTE — Progress Notes (Signed)
Received a call from Nurse regarding [name]. Dr Ranell Patrick note was reviewed, was in contact with Dr Ranell Patrick.  Dr Ranell Patrick stated to hold [name]  tonight , ordered given to nurse.

## 2021-10-15 NOTE — Progress Notes (Signed)
Physical Therapy Session Note  Patient Details  Name: [name] MRN: [id number] Date of Birth: Mar 16, 1966  Today's Date: 10/15/2021 PT Individual Time: 3818-2993 PT Individual Time Calculation (min): 24 min   Short Term Goals: Week 1:  PT Short Term Goal 1 (Week 1): Pt will transfer sit<>stand with LRAD and max A of 1 PT Short Term Goal 2 (Week 1): Pt will transfer bed<>chair with LRAD and max A of 1 PT Short Term Goal 3 (Week 1): Pt will initate gait training  Skilled Therapeutic Interventions/Progress Updates:   Received pt sitting up in [facility] with R knee immobilizer donned. Pt agreeable to PT treatment and denied any pain at rest but reports increased pain to 8/10 in LEs with movement. Session with emphasis on functional mobility/transfers, generalized strengthening, and improved activity tolerance. Pt requested to return to bed for dialysis and transferred WC<>bed via slideboard to R with mod A +2. Once pt began sliding on board, actually able to complete transfer with as little as min A of 1. Pt required max A to transfer sit<>supine for BLE management due to pain/weakness. Pt reported increased R knee pain - therefore removed knee immobilizer and pt reported immediate relief. Pt able to pull herself to [facility] with min A with use of Trendelenburg bed position. Pt very emotional at end of session and expressing to therapist desire to return to rehab after surgery tomorrow. Concluded session with pt semi-reclined in bed, needs within reach, and bed alarm on.   Therapy Documentation Precautions:  Precautions Precautions: Fall Precaution Comments: L AVF, R IJ perm-cath ( will need new dialysis cath) Required Braces or Orthoses: Knee Immobilizer - Right Knee Immobilizer - Right: On at all times Restrictions Weight Bearing Restrictions: No RLE Weight Bearing: Weight bearing as tolerated LLE Weight Bearing: Weight bearing as tolerated  Therapy/Group: Individual Therapy Alfonse Alpers PT, DPT   10/15/2021, 7:39 AM

## 2021-10-15 NOTE — Progress Notes (Addendum)
Resting at intervals throughout shift,swelling.tenderness and pain to bilateral LLE. Continue schedule medications and prn's as needed,  patient states this combination helps her the best. BM remains black and tarry with incontinent care provided,some repositioning that is tolerable to patient.  1200 cc,fluid restrictions  continue and reinforced to patient nf c.Patient refuse CHG bath.Patient had large black tarry stool, incontinent care provided,.Pain sore currently with in goal range.

## 2021-10-15 NOTE — Progress Notes (Signed)
Occupational Therapy Session Note  Patient Details  Name: [name] MRN: [id number] Date of Birth: [birth year]  Today's Date: 10/15/2021 OT Individual Time: 1017-5102 OT Individual Time Calculation (min): 27 min    Short Term Goals: Week 1:  OT Short Term Goal 1 (Week 1): Pt will stand with max A of 1 in the stedy OT Short Term Goal 2 (Week 1): Pt will improve pain management <8/10 during ADLs OT Short Term Goal 3 (Week 1): Pt will complete LB dressing with max A of 1  Skilled Therapeutic Interventions/Progress Updates:  Skilled OT intervention completed with focus on d/c planning, functional transfers to w/c. Pt received seated upright in bed, with pt stating "get me to that chair!" Pt completed donning of shorts while in long sitting with mod A for clearing over hips with pt using bed rail as assist for clearance of bottom with min A. Pt completed long sitting to EOB, with improved pace and pain tolerance this session with mod A needed mostly for bringing BLEs over the bed and to floor, however pt using leg lifter to advance LLE. Total A required for board placement with additional person needed to guard pt's trunk during lateral lean. Pt completed slide board transfer with Max of 1, min A of second person with therapist having to position BLEs throughout, and second person helping to boost hips for sliding and securement of w/c for safety. Pt very emotional after making it to the chair after > week, with therapist emotionally supporting pt. Discussed with pt about her rehab goals if she returns to [facility], for motivational encouragement to continue transferring. Pt left seated in w/c, with chair alarm on, RLE secured with gait belt per pt preference to prevent leg falling, LLE slightly bent for passive stretch at the knee, all needs in reach, and pt awaiting next PT.  Therapy Documentation Precautions:  Precautions Precautions: Fall Precaution Comments: L AVF, R IJ perm-cath ( will need new  dialysis cath) Required Braces or Orthoses: Knee Immobilizer - Right Knee Immobilizer - Right: On at all times Restrictions Weight Bearing Restrictions: No RLE Weight Bearing: Weight bearing as tolerated LLE Weight Bearing: Weight bearing as tolerated  Pain: Unrated pain during transfers, however improved with support/positioning   Therapy/Group: Individual Therapy  Kyrin Garn E Ricki Vanhandel 10/15/2021, 7:38 AM

## 2021-10-15 NOTE — Progress Notes (Signed)
Patient ID: [name], female   DOB: 1966/02/12, 56 y.o.   MRN: [id number]  Team Conference Report to Patient/Family  Team Conference discussion was reviewed with the patient and caregiver, including goals, any changes in plan of care and target discharge date.  Patient and caregiver express understanding and are in agreement.  The patient has a target discharge date of 10/16/21.  Patient will discharge to surgery on tomorrow. Patient will return, requesting to remain on the same team upon returning. Patient informed that upon returning more participation will be required. No additional questions or concern, sw will continue to follow up  [name] 10/15/2021, 3:05 PM

## 2021-10-15 NOTE — Progress Notes (Signed)
Recreational Therapy Discharge Summary Patient Details  Name: [name] MRN: [id number] Date of Birth: 09/25/1965 Today's Date: 10/15/2021   Comments on progress toward goals: Met with pt briefly last week.  Pt is scheduled to transfer to acute for surgery tomorrow.  Pt has not yet participated in TR session outside of evaluation.  Reasons for discharge:  transfer to acute for surgery  Patient/family agrees with progress made and goals achieved: Yes  [name] 10/15/2021, 11:59 AM

## 2021-10-15 NOTE — Progress Notes (Signed)
Inpatient Rehabilitation Care Coordinator Discharge Note   Patient Details  Name: [name] MRN: [id number] Date of Birth: 1966-01-24   Discharge location: Surgery  Length of Stay: 9 Days  Discharge activity level: Total  Home/community participation: spouse  Patient response FU:QXAFHS Literacy - How often do you need to have someone help you when you read instructions, pamphlets, or other written material from your doctor or pharmacy?: Never  Patient response VE:XOGACG Isolation - How often do you feel lonely or isolated from those around you?: Never  Services provided included: SW, Pharmacy, TR, RN, CM, SLP, OT, RD, PT, MD  Financial Services:  Financial Services Utilized: Medicare    Choices offered to/list presented to: patient  Follow-up services arranged:              Patient response to transportation need: Is the patient able to respond to transportation needs?: Yes In the past 12 months, has lack of transportation kept you from medical appointments or from getting medications?: No In the past 12 months, has lack of transportation kept you from meetings, work, or from getting things needed for daily living?: No    Comments (or additional information):  Patient/Family verbalized understanding of follow-up arrangements:  Yes  Individual responsible for coordination of the follow-up plan:    Confirmed correct DME delivered: [name] 10/15/2021    [name]

## 2021-10-15 NOTE — Progress Notes (Signed)
PROGRESS NOTE   Subjective/Complaints: Got out of bed with [name] today! Plan for surgery tomorrow- she would like to know what time Tolerating regular diet well   ROS: Constipation now improved, +pain in bilateral lower extremities, +loose incontinent stool  Objective:   DG Abd 1 View  Result Date: 10/13/2021 CLINICAL DATA:  Ileus EXAM: ABDOMEN - 1 VIEW COMPARISON:  10/13/2011 FINDINGS: Vascular catheter tip over the low right atrium. Nonobstructed gas pattern with mild stool. Left hip replacement. IMPRESSION: Nonobstructed gas pattern Electronically Signed   By: Donavan Foil M.D.   On: 10/13/2021 15:19   Recent Labs    10/13/21 0811  WBC 8.3  HGB 8.2*  HCT 26.4*  PLT 326    Recent Labs    10/13/21 0811  NA 134*  K 5.9*  CL 96*  CO2 23  GLUCOSE 199*  BUN 50*  CREATININE 7.50*  CALCIUM 9.2     Intake/Output Summary (Last 24 hours) at 10/15/2021 1158 Last data filed at 10/14/2021 2100 Gross per 24 hour  Intake 420 ml  Output --  Net 420 ml         Physical Exam: Vital Signs Blood pressure (!) 152/73, pulse 92, temperature 98 F (36.7 C), temperature source Oral, resp. rate 18, height 5\' 7"  (1.702 m), weight 76.7 kg, SpO2 100 %.     General: awake, alert, appropriate, laying in bed- NAD, BMI 26.48 HENT: conjugate gaze; oropharynx moist CV: regular rate; no JVD Pulmonary: CTA B/L; no W/R/R- good air movement GI: much more soft, NT, ND, (+)BS- hypoactive still Psychiatric: appropriate Neurological: Ox3 Musculoskeletal:        General: Swelling and tenderness present.     Cervical back: Normal range of motion and neck supple.     Right lower leg: Edema present.     Left lower leg: Edema present.     Comments: Left hip tender to palpation and PROM with associated swelling. Left knee with mild effusion, patella laxity. Right hip tender with leg raise  Active range of motion by patient with good  improvements Right knee tender and swollen Able to get out of bed today Skin:    Comments: Incision site dressing clean dry and intact.   Neurological:     Comments: Patient is alert.  No acute distress.  Oriented x3 and follows commands. Alert and oriented x 3. Normal insight and awareness. Intact Memory. Normal language and speech. Cranial nerve exam unremarkable. UE motor 5/5. LE motor limited proximally by pain. ADF/PF 4-5/5. No sensory findings.   Psychiatric:     Comments: Pleasant, slightly anxious  GU: stool incontinence in diaper   Assessment/Plan: 1. Functional deficits which require 3+ hours per day of interdisciplinary therapy in a comprehensive inpatient rehab setting. Physiatrist is providing close team supervision and 24 hour management of active medical problems listed below. Physiatrist and rehab team continue to assess barriers to discharge/monitor patient progress toward functional and medical goals  Care Tool:  Bathing  Bathing activity did not occur: Safety/medical concerns Body parts bathed by patient: Right arm, Left arm, Chest, Abdomen, Face         Bathing assist Assist Level: Supervision/Verbal cueing (  EOB)     Upper Body Dressing/Undressing Upper body dressing   What is the patient wearing?: Pull over shirt    Upper body assist Assist Level: Supervision/Verbal cueing (EOB)    Lower Body Dressing/Undressing Lower body dressing      What is the patient wearing?: Incontinence brief     Lower body assist Assist for lower body dressing: Moderate Assistance - Patient 50 - 74%     Toileting Toileting    Toileting assist Assist for toileting: Maximal Assistance - Patient 25 - 49%     Transfers Chair/bed transfer  Transfers assist     Chair/bed transfer assist level: 2 Helpers (slideboard)     Locomotion Ambulation   Ambulation assist   Ambulation activity did not occur: Safety/medical concerns (weakness, fatigue, pain, anxiety)           Walk 10 feet activity   Assist  Walk 10 feet activity did not occur: Safety/medical concerns (weakness, fatigue, pain, anxiety)        Walk 50 feet activity   Assist Walk 50 feet with 2 turns activity did not occur: Safety/medical concerns (weakness, fatigue, pain, anxiety)         Walk 150 feet activity   Assist Walk 150 feet activity did not occur: Safety/medical concerns (weakness, fatigue, pain, anxiety)         Walk 10 feet on uneven surface  activity   Assist Walk 10 feet on uneven surfaces activity did not occur: Safety/medical concerns (weakness, fatigue, pain, anxiety)         Wheelchair     Assist Is the patient using a wheelchair?: Yes Type of Wheelchair: Manual Wheelchair activity did not occur: Safety/medical concerns (weakness, fatigue, pain, anxiety)         Wheelchair 50 feet with 2 turns activity    Assist    Wheelchair 50 feet with 2 turns activity did not occur: Safety/medical concerns (weakness, fatigue, pain, anxiety)       Wheelchair 150 feet activity     Assist  Wheelchair 150 feet activity did not occur: Safety/medical concerns (weakness, fatigue, pain, anxiety)       Blood pressure (!) 152/73, pulse 92, temperature 98 F (36.7 C), temperature source Oral, resp. rate 18, height 5\' 7"  (1.702 m), weight 76.7 kg, SpO2 100 %.    Medical Problem List and Plan: 1. Functional deficits secondary to nondisplaced fracture right inferior pubic ramus as well as a displaced foreshortened fracture through the left femoral neck/intertrochanteric area.  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   -Weightbearing as tolerated with anterior total hip precautions.                                  -Conservative care right inferior pubic ramus fracture, weightbearing as tolerated             -patient may  shower             -ELOS/Goals: 10-14 days, supervision to min assist goals  Discussed with team plan for  surgery on Thursday. NPO at midnight on Wednesday  [place] scheduled  -Interdisciplinary Team Conference today   2.  Antithrombotics: -DVT/anticoagulation:  Mechanical: Antiembolism stockings, thigh (TED hose) Bilateral lower extremities check vascular study.   -should be able to begin lovenox today as permacath placed            -antiplatelet therapy: N/A 3. Femur fracture  pain: continue Flector patch,Robaxin 750 mg 4 times daily, hydrocodone and oxycodone as needed             -pt having significant spasms in left thigh.             -add kpad to help with spasms  -schedule tylenol 1000mg  TID 4. Anxiety: Instructed on deep breathing techniques. Provide emotional support.  Melatonin as needed             -antipsychotic agents: N/A 5. Neuropsych: This patient is capable of making decisions on her own behalf. 6. Skin/Wound Care: Routine skin checks 7. Fluids/Electrolytes/Nutrition: Routine in and outs with follow-up chemistries 8.  End-stage renal disease/hemodialysis.  Permacath exchanged 10/03/2021.    -  Follow-up hemodialysis per Dr. [name] -HD later in day to allow participation in therapies during the day 9.  Acute on chronic anemia.  Has had transfusions already x 2. Hgb has stabilized in 7 range..  Continue iron supplement.              -epo per nephrology 10.  Diabetes mellitus with peripheral neuropathy. Placed order that she may use her Dexcom 6. Hemoglobin A1c 6.2.  SSI as prior to admission. Discuss Qutenza.  11.  Hypertension.  ContinueCozaar 50 mg daily, 12.  Hyperparathyroidism of renal origin.  Plan outpatient parathyroidectomy per Dr.Lateef 13.  Overweight.  BMI 29.11.  Dietary follow-up. Provided list of foods that can help with weight loss.  14. Cushingoid?--outpt work up 26. Constipation: resolved. Decrease colace to daily prn. D/c senna. Discussed benefits of high fiber foods 16. Right quadriceps tear: plan for surgery Thursday- discussed with team.  Precautions as per ortho. NPO at midnight on Wednesday. Discussed patient will likely return to rehab after post-surgical monitoring/clearance from her surgeon.  17. Gas: continue simethicone 18. Ileus: resolved, regular diet resumed -tolerating.  19. Abd spasms: resolved 20. Black tarry stools: discussed heme occult negative last night. Hgb reviewed and stable.     LOS: 9 days A FACE TO FACE EVALUATION WAS PERFORMED  Clide Deutscher Narcisa Ganesh 10/15/2021, 11:58 AM

## 2021-10-15 NOTE — Progress Notes (Signed)
[facility] Progress Note   Subjective:   Patient seen and examined at bedside. Feeling well.  Waiting for PT.  Denies CP, SOB, abdominal pain and n/v/d.   Objective Vitals:   10/14/21 0517 10/14/21 1550 10/14/21 1918 10/15/21 0413  BP: (!) 154/69 (!) 156/74 (!) 147/62 (!) 152/73  Pulse: 90 81 86 92  Resp: 17 16 18 18   Temp: 98.1 F (36.7 C) 98.7 F (37.1 C) 98.2 F (36.8 C) 98 F (36.7 C)  TempSrc: Oral Oral Oral Oral  SpO2: 97% 98% 98% 100%  Weight:      Height:       Physical Exam General:well appearing female in NAD Heart:RRR, no mrg Lungs:CTAB, nml WOB on RA Abdomen:soft, NTND Extremities:no LE edema Dialysis Access:  R IJ [facility]  Filed Weights   10/10/21 1818 10/13/21 0753 10/13/21 1055  Weight: 78.6 kg 78.7 kg 76.7 kg    Intake/Output Summary (Last 24 hours) at 10/15/2021 0912 Last data filed at 10/14/2021 2100 Gross per 24 hour  Intake 420 ml  Output --  Net 420 ml    Additional Objective Labs: Basic Metabolic Panel: Recent Labs  Lab 10/10/21 1459 10/12/21 0649 10/13/21 0811  NA 134* 137 134*  K 4.4 4.4 5.9*  CL 94* 97* 96*  CO2 26 25 23   GLUCOSE 206* 166* 199*  BUN 51* 36* 50*  CREATININE 7.16* 6.08* 7.50*  CALCIUM 8.7* 9.3 9.2  PHOS 4.3 4.3 4.3   Liver Function Tests: Recent Labs  Lab 10/10/21 1459 10/12/21 0649 10/13/21 0811  ALBUMIN 2.9* 2.8* 3.0*   CBC: Recent Labs  Lab 10/10/21 1459 10/12/21 0649 10/13/21 0811  WBC 11.5* 9.2 8.3  NEUTROABS  --  7.4  --   HGB 8.1* 8.3* 8.2*  HCT 24.4* 26.3* 26.4*  MCV 97.6 98.9 100.8*  PLT 266 274 326   CBG: Recent Labs  Lab 10/08/21 1137 10/08/21 2016 10/09/21 1252  GLUCAP 198* 172* 147*   Studies/Results: DG Abd 1 View  Result Date: 10/13/2021 CLINICAL DATA:  Ileus EXAM: ABDOMEN - 1 VIEW COMPARISON:  10/13/2011 FINDINGS: Vascular catheter tip over the low right atrium. Nonobstructed gas pattern with mild stool. Left hip replacement. IMPRESSION: Nonobstructed gas  pattern Electronically Signed   By: Donavan Foil M.D.   On: 10/13/2021 15:19    Medications:   acetaminophen  1,000 mg Oral TID   vitamin C  500 mg Oral Daily   Chlorhexidine Gluconate Cloth  6 each Topical Q0600   darbepoetin (ARANESP) injection - DIALYSIS  40 mcg Intravenous Q Fri-HD   diclofenac  1 patch Transdermal BID   diclofenac Sodium  2 g Topical QID   enoxaparin (LOVENOX) injection  30 mg Subcutaneous Q24H   ferric citrate  420 mg Oral TID WC   insulin aspart  0-6 Units Subcutaneous TID WC   losartan  50 mg Oral Daily   methocarbamol  750 mg Oral QID   multivitamin  1 tablet Oral QHS   Vitamin D (Ergocalciferol)  50,000 Units Oral Q7 days    Dialysis Orders: MWF [facility] [address] [phone])  3h 24min  81kg   Hep 3000 then 800u/hr  RIJ TDC     Assessment/ Plan: Debility - rehab per CIR Tendon rupture - RLE. Seen by orthopedics. For surgery Thursday L hip fracture/ R pelvic ramus fx - sp L hemiarthroplasty on 09/29/21 at [facility] - on HD mwf. Continue on schedule. Next HD today BP/ volume - BP elevated. Continue home  meds.  Under dry wt, UF as tolerated.  DM2 - per pmd Anemia ckd - Last Hgb 8.2. S/p transfusion. ESA started,  Aranesp 40 on 2/10.  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  Continue binders.  9. Nutrition - currently on regular diet w/fluid restrictions.  Follow labs if K starts trending up change to renal diet.  Elevated pre HD on Monday if elevated again today change back to renal diet.   Jen Mow, PA-C [facility] 10/15/2021,9:12 AM  LOS: 9 days

## 2021-10-16 ENCOUNTER — Inpatient Hospital Stay (HOSPITAL_BASED_OUTPATIENT_CLINIC_OR_DEPARTMENT_OTHER): Payer: Medicare Other | Admitting: Certified Registered Nurse Anesthetist

## 2021-10-16 ENCOUNTER — Inpatient Hospital Stay (HOSPITAL_COMMUNITY): Payer: Medicare Other | Admitting: Certified Registered Nurse Anesthetist

## 2021-10-16 ENCOUNTER — Observation Stay (HOSPITAL_COMMUNITY)
Admission: RE | Admit: 2021-10-16 | Discharge: 2021-10-17 | Disposition: A | Discharge status: Rehabilitation Facility | Payer: Medicare Other | Source: Ambulatory Visit | Attending: Internal Medicine | Admitting: Internal Medicine

## 2021-10-16 ENCOUNTER — Encounter (HOSPITAL_COMMUNITY)
Admission: RE | Disposition: A | Discharge status: Other Acute Inpatient Hospital | Payer: Self-pay | Source: Other Acute Inpatient Hospital | Attending: Physical Medicine and Rehabilitation

## 2021-10-16 ENCOUNTER — Inpatient Hospital Stay (HOSPITAL_COMMUNITY): Payer: Medicare Other

## 2021-10-16 ENCOUNTER — Encounter (HOSPITAL_COMMUNITY)
Admission: RE | Disposition: A | Discharge status: Other Acute Inpatient Hospital | Payer: Self-pay | Source: Ambulatory Visit | Attending: Internal Medicine

## 2021-10-16 ENCOUNTER — Other Ambulatory Visit: Payer: Self-pay

## 2021-10-16 DIAGNOSIS — Z794 Long term (current) use of insulin: Secondary | ICD-10-CM | POA: Insufficient documentation

## 2021-10-16 DIAGNOSIS — Z79899 Other long term (current) drug therapy: Secondary | ICD-10-CM | POA: Diagnosis not present

## 2021-10-16 DIAGNOSIS — S76119A Strain of unspecified quadriceps muscle, fascia and tendon, initial encounter: Principal | ICD-10-CM | POA: Diagnosis present

## 2021-10-16 DIAGNOSIS — S76121A Laceration of right quadriceps muscle, fascia and tendon, initial encounter: Principal | ICD-10-CM | POA: Insufficient documentation

## 2021-10-16 DIAGNOSIS — I10 Essential (primary) hypertension: Secondary | ICD-10-CM | POA: Diagnosis present

## 2021-10-16 DIAGNOSIS — Z96641 Presence of right artificial hip joint: Secondary | ICD-10-CM | POA: Diagnosis not present

## 2021-10-16 DIAGNOSIS — E1022 Type 1 diabetes mellitus with diabetic chronic kidney disease: Secondary | ICD-10-CM

## 2021-10-16 DIAGNOSIS — I120 Hypertensive chronic kidney disease with stage 5 chronic kidney disease or end stage renal disease: Secondary | ICD-10-CM | POA: Diagnosis not present

## 2021-10-16 DIAGNOSIS — N186 End stage renal disease: Secondary | ICD-10-CM | POA: Diagnosis not present

## 2021-10-16 DIAGNOSIS — X58XXXA Exposure to other specified factors, initial encounter: Secondary | ICD-10-CM | POA: Diagnosis not present

## 2021-10-16 DIAGNOSIS — Z992 Dependence on renal dialysis: Secondary | ICD-10-CM | POA: Diagnosis not present

## 2021-10-16 DIAGNOSIS — E109 Type 1 diabetes mellitus without complications: Secondary | ICD-10-CM

## 2021-10-16 DIAGNOSIS — Z419 Encounter for procedure for purposes other than remedying health state, unspecified: Secondary | ICD-10-CM

## 2021-10-16 DIAGNOSIS — E1122 Type 2 diabetes mellitus with diabetic chronic kidney disease: Secondary | ICD-10-CM | POA: Insufficient documentation

## 2021-10-16 DIAGNOSIS — M80052A Age-related osteoporosis with current pathological fracture, left femur, initial encounter for fracture: Secondary | ICD-10-CM

## 2021-10-16 DIAGNOSIS — S7290XA Unspecified fracture of unspecified femur, initial encounter for closed fracture: Secondary | ICD-10-CM | POA: Diagnosis present

## 2021-10-16 DIAGNOSIS — S76111A Strain of right quadriceps muscle, fascia and tendon, initial encounter: Secondary | ICD-10-CM | POA: Diagnosis not present

## 2021-10-16 DIAGNOSIS — S72001A Fracture of unspecified part of neck of right femur, initial encounter for closed fracture: Secondary | ICD-10-CM | POA: Diagnosis present

## 2021-10-16 DIAGNOSIS — E1169 Type 2 diabetes mellitus with other specified complication: Secondary | ICD-10-CM

## 2021-10-16 HISTORY — PX: QUADRICEPS TENDON REPAIR: SHX756

## 2021-10-16 LAB — POCT I-STAT, CHEM 8
BUN: 31 mg/dL — ABNORMAL HIGH (ref 6–20)
Calcium, Ion: 1.16 mmol/L (ref 1.15–1.40)
Chloride: 100 mmol/L (ref 98–111)
Creatinine, Ser: 5.4 mg/dL — ABNORMAL HIGH (ref 0.44–1.00)
Glucose, Bld: 166 mg/dL — ABNORMAL HIGH (ref 70–99)
HCT: 32 % — ABNORMAL LOW (ref 36.0–46.0)
Hemoglobin: 10.9 g/dL — ABNORMAL LOW (ref 12.0–15.0)
Potassium: 4.4 mmol/L (ref 3.5–5.1)
Sodium: 136 mmol/L (ref 135–145)
TCO2: 26 mmol/L (ref 22–32)

## 2021-10-16 LAB — GLUCOSE, CAPILLARY
Glucose-Capillary: 171 mg/dL — ABNORMAL HIGH (ref 70–99)
Glucose-Capillary: 167 mg/dL — ABNORMAL HIGH (ref 70–99)
Glucose-Capillary: 172 mg/dL — ABNORMAL HIGH (ref 70–99)
Glucose-Capillary: 321 mg/dL — ABNORMAL HIGH (ref 70–99)
Glucose-Capillary: 282 mg/dL — ABNORMAL HIGH (ref 70–99)

## 2021-10-16 IMAGING — RF DG HIP (WITH OR WITHOUT PELVIS) 2-3V*L*
1 series · 1 of 1 positions shown · non-contrast
Comparison: X-ray left hip [DATE]

CLINICAL DATA: Left hip  REPAIR QUADRICEP TENDON

EXAM:
DG HIP (WITH OR WITHOUT PELVIS) 2-3V LEFT

[Series 1: run · 1 of 1 slices shown]
[im 1/1]
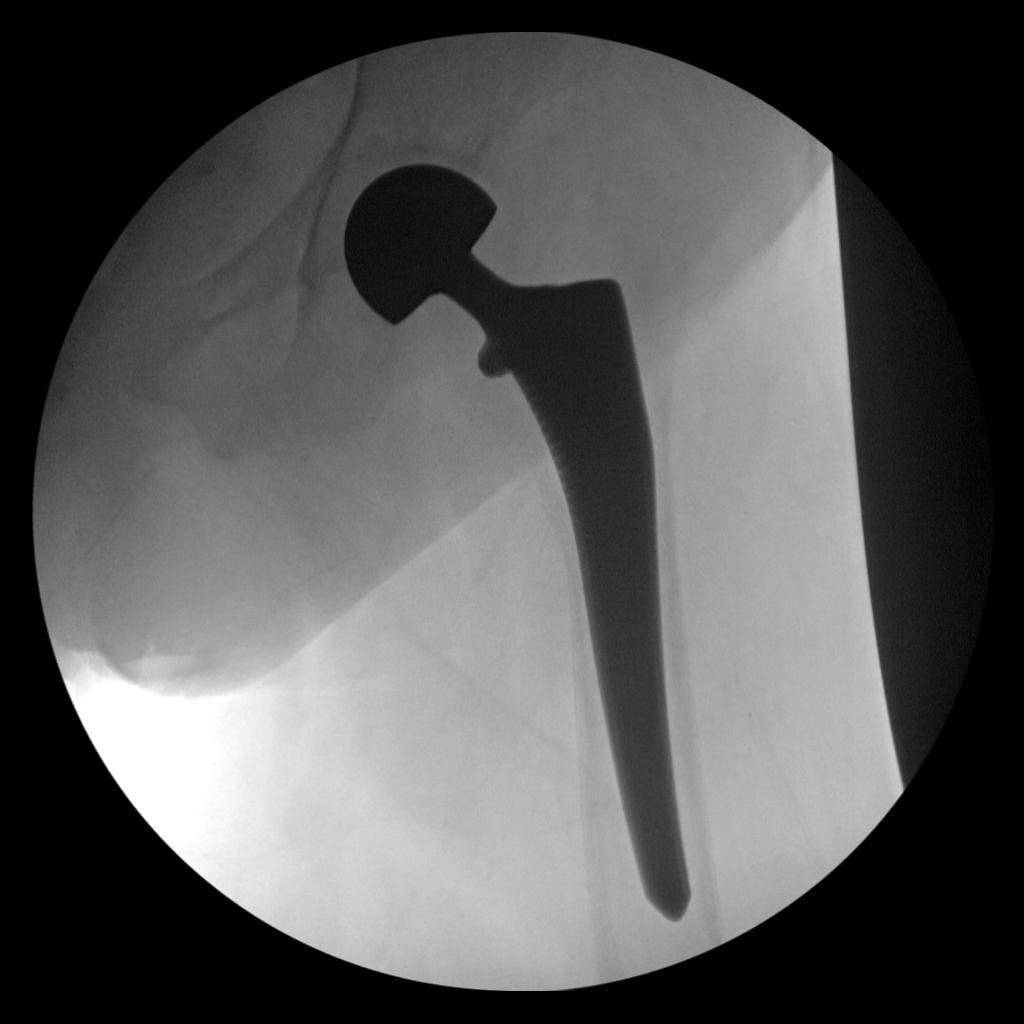

[1 of 1 positions shown; findings below may reference images not displayed]

FINDINGS: Intraoperative repair of the quadriceps tendon per history.

One low resolution intraoperative spot views of the left hip were
obtained in a patient status post total left hip arthroplasty. No
fracture visible on the limited views.

Total fluoroscopy time: 10.3 seconds

Total radiation dose: 1.8 mGy
IMPRESSION: Intraoperative repair of the quadriceps tendon in a patient status
post total left hip arthroplasty.

## 2021-10-16 SURGERY — REPAIR, TENDON, QUADRICEPS
Anesthesia: General | Laterality: Right

## 2021-10-16 SURGERY — REPAIR, TENDON, QUADRICEPS
Anesthesia: General | Site: Knee | Laterality: Right

## 2021-10-16 MED ORDER — LIDOCAINE-EPINEPHRINE (PF) 2 %-1:200000 IJ SOLN
INTRAMUSCULAR | Status: DC | PRN
  Administered 2021-10-16: 10 mL via INTRADERMAL

## 2021-10-16 MED ORDER — FENTANYL CITRATE (PF) 100 MCG/2ML IJ SOLN
INTRAMUSCULAR | Status: DC | PRN
  Administered 2021-10-16 (×2): 25 ug via INTRAVENOUS
  Administered 2021-10-16 (×4): 50 ug via INTRAVENOUS

## 2021-10-16 MED ORDER — MIDAZOLAM HCL 2 MG/2ML IJ SOLN
INTRAMUSCULAR | Status: AC
  Filled 2021-10-16: qty 2

## 2021-10-16 MED ORDER — CEFAZOLIN SODIUM-DEXTROSE 2-4 GM/100ML-% IV SOLN
INTRAVENOUS | Status: DC
Start: 2021-10-16 — End: 2021-10-16
  Filled 2021-10-16: qty 100

## 2021-10-16 MED ORDER — LACTATED RINGERS IV SOLN: INTRAVENOUS | Status: DC

## 2021-10-16 MED ORDER — DEXAMETHASONE SODIUM PHOSPHATE 4 MG/ML IJ SOLN
INTRAMUSCULAR | Status: DC | PRN
  Administered 2021-10-16: 5 mg via INTRAVENOUS

## 2021-10-16 MED ORDER — MIDAZOLAM HCL 5 MG/5ML IJ SOLN
INTRAMUSCULAR | Status: DC | PRN
  Administered 2021-10-16: 2 mg via INTRAVENOUS

## 2021-10-16 MED ORDER — SODIUM CHLORIDE 0.9 % IV SOLN: INTRAVENOUS | Status: DC | PRN

## 2021-10-16 MED ORDER — LOSARTAN POTASSIUM 50 MG PO TABS
50.0000 mg | ORAL_TABLET | Freq: Every day | ORAL | Status: DC
Start: 2021-10-16 — End: 2021-10-17
  Administered 2021-10-16 – 2021-10-17 (×2): 50 mg via ORAL
  Filled 2021-10-16 (×2): qty 1

## 2021-10-16 MED ORDER — HYDROMORPHONE HCL 1 MG/ML IJ SOLN: 0.2500 mg | INTRAMUSCULAR | Status: DC | PRN

## 2021-10-16 MED ORDER — KETAMINE HCL 10 MG/ML IJ SOLN
INTRAMUSCULAR | Status: DC | PRN
  Administered 2021-10-16: 20 mg via INTRAVENOUS

## 2021-10-16 MED ORDER — DEXMEDETOMIDINE (PRECEDEX) IN NS 20 MCG/5ML (4 MCG/ML) IV SYRINGE
PREFILLED_SYRINGE | INTRAVENOUS | Status: AC
  Filled 2021-10-16: qty 5

## 2021-10-16 MED ORDER — POLYETHYLENE GLYCOL 3350 17 G PO PACK
17.0000 g | PACK | Freq: Every day | ORAL | Status: DC
  Administered 2021-10-17: 17 g via ORAL
  Filled 2021-10-16: qty 1

## 2021-10-16 MED ORDER — PHENYLEPHRINE HCL (PRESSORS) 10 MG/ML IV SOLN
INTRAVENOUS | Status: DC | PRN
  Administered 2021-10-16: 40 ug via INTRAVENOUS

## 2021-10-16 MED ORDER — DROPERIDOL 2.5 MG/ML IJ SOLN: 0.6250 mg | Freq: Once | INTRAMUSCULAR | Status: DC | PRN

## 2021-10-16 MED ORDER — ASCORBIC ACID 500 MG PO TABS
500.0000 mg | ORAL_TABLET | Freq: Every day | ORAL | Status: DC
  Administered 2021-10-17: 500 mg via ORAL
  Filled 2021-10-16: qty 1

## 2021-10-16 MED ORDER — 0.9 % SODIUM CHLORIDE (POUR BTL) OPTIME
TOPICAL | Status: DC | PRN
  Administered 2021-10-16: 1000 mL

## 2021-10-16 MED ORDER — DEXAMETHASONE SODIUM PHOSPHATE 10 MG/ML IJ SOLN
INTRAMUSCULAR | Status: AC
  Filled 2021-10-16: qty 1

## 2021-10-16 MED ORDER — SODIUM CHLORIDE 0.9 % IV SOLN: INTRAVENOUS | Status: DC

## 2021-10-16 MED ORDER — ONDANSETRON HCL 4 MG/2ML IJ SOLN
INTRAMUSCULAR | Status: AC
  Filled 2021-10-16: qty 2

## 2021-10-16 MED ORDER — ALBUTEROL SULFATE (2.5 MG/3ML) 0.083% IN NEBU: 2.5000 mg | INHALATION_SOLUTION | RESPIRATORY_TRACT | Status: DC | PRN

## 2021-10-16 MED ORDER — LIDOCAINE HCL (CARDIAC) PF 100 MG/5ML IV SOSY
PREFILLED_SYRINGE | INTRAVENOUS | Status: DC | PRN
  Administered 2021-10-16: 60 mg via INTRAVENOUS

## 2021-10-16 MED ORDER — ONDANSETRON HCL 4 MG PO TABS: 4.0000 mg | ORAL_TABLET | Freq: Four times a day (QID) | ORAL | Status: DC | PRN

## 2021-10-16 MED ORDER — ACETAMINOPHEN 500 MG PO TABS
1000.0000 mg | ORAL_TABLET | Freq: Three times a day (TID) | ORAL | Status: DC
  Administered 2021-10-16: 1000 mg via ORAL
  Filled 2021-10-16: qty 2

## 2021-10-16 MED ORDER — ONDANSETRON HCL 4 MG/2ML IJ SOLN: 4.0000 mg | Freq: Four times a day (QID) | INTRAMUSCULAR | Status: DC | PRN

## 2021-10-16 MED ORDER — NEPRO/CARBSTEADY PO LIQD
237.0000 mL | Freq: Two times a day (BID) | ORAL | Status: DC
  Administered 2021-10-17: 237 mL via ORAL

## 2021-10-16 MED ORDER — INSULIN ASPART 100 UNIT/ML IJ SOLN
0.0000 [IU] | INTRAMUSCULAR | Status: DC | PRN
  Administered 2021-10-16: 7 [IU] via SUBCUTANEOUS

## 2021-10-16 MED ORDER — HEPARIN SODIUM (PORCINE) 5000 UNIT/ML IJ SOLN: 5000.0000 [IU] | Freq: Three times a day (TID) | INTRAMUSCULAR | Status: DC

## 2021-10-16 MED ORDER — CHLORHEXIDINE GLUCONATE 0.12 % MT SOLN: 15.0000 mL | Freq: Once | OROMUCOSAL | Status: AC

## 2021-10-16 MED ORDER — PROPOFOL 10 MG/ML IV BOLUS
INTRAVENOUS | Status: DC | PRN
  Administered 2021-10-16: 150 mg via INTRAVENOUS

## 2021-10-16 MED ORDER — CEFAZOLIN SODIUM-DEXTROSE 2-4 GM/100ML-% IV SOLN: 2.0000 g | INTRAVENOUS | Status: DC

## 2021-10-16 MED ORDER — MORPHINE SULFATE (PF) 2 MG/ML IV SOLN: 1.0000 mg | INTRAVENOUS | Status: DC | PRN

## 2021-10-16 MED ORDER — CHLORHEXIDINE GLUCONATE CLOTH 2 % EX PADS
6.0000 | MEDICATED_PAD | Freq: Every day | CUTANEOUS | Status: DC
  Administered 2021-10-16: 6 via TOPICAL

## 2021-10-16 MED ORDER — CHLORHEXIDINE GLUCONATE 0.12 % MT SOLN
OROMUCOSAL | Status: AC
  Administered 2021-10-16: 15 mL via OROMUCOSAL
  Filled 2021-10-16: qty 15

## 2021-10-16 MED ORDER — METHOCARBAMOL 750 MG PO TABS
750.0000 mg | ORAL_TABLET | Freq: Three times a day (TID) | ORAL | Status: DC | PRN
  Administered 2021-10-17 (×2): 750 mg via ORAL
  Filled 2021-10-16 (×2): qty 1

## 2021-10-16 MED ORDER — ONDANSETRON HCL 4 MG/2ML IJ SOLN
INTRAMUSCULAR | Status: DC | PRN
  Administered 2021-10-16: 4 mg via INTRAVENOUS

## 2021-10-16 MED ORDER — PHENYLEPHRINE HCL-NACL 20-0.9 MG/250ML-% IV SOLN
INTRAVENOUS | Status: DC | PRN
  Administered 2021-10-16: 40 ug/min via INTRAVENOUS

## 2021-10-16 MED ORDER — INSULIN ASPART 100 UNIT/ML IJ SOLN
0.0000 [IU] | Freq: Three times a day (TID) | INTRAMUSCULAR | Status: DC
  Administered 2021-10-17 (×2): 2 [IU] via SUBCUTANEOUS

## 2021-10-16 MED ORDER — LIDOCAINE 2% (20 MG/ML) 5 ML SYRINGE
INTRAMUSCULAR | Status: AC
  Filled 2021-10-16: qty 5

## 2021-10-16 MED ORDER — OXYCODONE HCL 5 MG PO TABS: 5.0000 mg | ORAL_TABLET | ORAL | Status: DC | PRN

## 2021-10-16 MED ORDER — LIDOCAINE-EPINEPHRINE 2 %-1:100000 IJ SOLN
INTRAMUSCULAR | Status: AC
  Filled 2021-10-16: qty 1

## 2021-10-16 MED ORDER — RENA-VITE PO TABS
1.0000 | ORAL_TABLET | Freq: Every day | ORAL | Status: DC
  Administered 2021-10-16: 1 via ORAL
  Filled 2021-10-16: qty 1

## 2021-10-16 MED ORDER — FENTANYL CITRATE (PF) 250 MCG/5ML IJ SOLN
INTRAMUSCULAR | Status: AC
  Filled 2021-10-16: qty 5

## 2021-10-16 MED ORDER — PROPOFOL 10 MG/ML IV BOLUS
INTRAVENOUS | Status: AC
  Filled 2021-10-16: qty 20

## 2021-10-16 MED ORDER — ORAL CARE MOUTH RINSE: 15.0000 mL | Freq: Once | OROMUCOSAL | Status: AC

## 2021-10-16 MED ORDER — KETAMINE HCL 50 MG/5ML IJ SOSY
PREFILLED_SYRINGE | INTRAMUSCULAR | Status: AC
  Filled 2021-10-16: qty 5

## 2021-10-16 SURGICAL SUPPLY — 55 items
ADH SKN CLS APL DERMABOND .7 (GAUZE/BANDAGES/DRESSINGS) ×1
APL PRP STRL LF DISP 70% ISPRP (MISCELLANEOUS) ×1
BANDAGE ESMARK 6X9 LF (GAUZE/BANDAGES/DRESSINGS) ×1 IMPLANT
BLADE SURG 10 STRL SS (BLADE) ×1 IMPLANT
BLADE SURG 15 STRL LF DISP TIS (BLADE) ×1 IMPLANT
BLADE SURG 15 STRL SS (BLADE) ×2
BNDG CMPR 9X6 STRL LF SNTH (GAUZE/BANDAGES/DRESSINGS) ×1
BNDG CMPR MED 10X6 ELC LF (GAUZE/BANDAGES/DRESSINGS) ×1
BNDG ELASTIC 6X10 VLCR STRL LF (GAUZE/BANDAGES/DRESSINGS) ×2 IMPLANT
BNDG ESMARK 6X9 LF (GAUZE/BANDAGES/DRESSINGS) ×2
CHLORAPREP W/TINT 26 (MISCELLANEOUS) ×2 IMPLANT
CUFF TOURN SGL QUICK 24 (TOURNIQUET CUFF) ×2
CUFF TRNQT CYL 24X4X40X1 (TOURNIQUET CUFF) IMPLANT
DERMABOND ADVANCED (GAUZE/BANDAGES/DRESSINGS) ×1
DERMABOND ADVANCED .7 DNX12 (GAUZE/BANDAGES/DRESSINGS) ×1 IMPLANT
DRAPE EXTREMITY T 121X128X90 (DISPOSABLE) IMPLANT
DRAPE U-SHAPE 47X51 STRL (DRAPES) ×2 IMPLANT
DRSG AQUACEL AG ADV 3.5X10 (GAUZE/BANDAGES/DRESSINGS) ×2 IMPLANT
ELECT REM PT RETURN 9FT ADLT (ELECTROSURGICAL) ×2
ELECTRODE REM PT RTRN 9FT ADLT (ELECTROSURGICAL) ×1 IMPLANT
GLOVE SRG 8 PF TXTR STRL LF DI (GLOVE) ×1 IMPLANT
GLOVE SURG ORTHO LTX SZ7.5 (GLOVE) ×4 IMPLANT
GLOVE SURG UNDER POLY LF SZ8 (GLOVE) ×2
GOWN STRL REUS W/ TWL LRG LVL3 (GOWN DISPOSABLE) ×1 IMPLANT
GOWN STRL REUS W/ TWL XL LVL3 (GOWN DISPOSABLE) ×1 IMPLANT
GOWN STRL REUS W/TWL LRG LVL3 (GOWN DISPOSABLE) ×2
GOWN STRL REUS W/TWL XL LVL3 (GOWN DISPOSABLE) ×2
KIT BASIN OR (CUSTOM PROCEDURE TRAY) ×2 IMPLANT
NDL KEITH (NEEDLE) ×1 IMPLANT
NEEDLE HYPO 22GX1.5 SAFETY (NEEDLE) ×2 IMPLANT
NEEDLE KEITH (NEEDLE) ×2 IMPLANT
NS IRRIG 1000ML POUR BTL (IV SOLUTION) ×2 IMPLANT
PACK ORTHO EXTREMITY (CUSTOM PROCEDURE TRAY) ×2 IMPLANT
PAD CAST 4YDX4 CTTN HI CHSV (CAST SUPPLIES) IMPLANT
PADDING CAST COTTON 4X4 STRL (CAST SUPPLIES) ×2
PENCIL SMOKE EVACUATOR (MISCELLANEOUS) ×2 IMPLANT
RETRIEVER SUT HEWSON (MISCELLANEOUS) ×1 IMPLANT
SLEEVE SCD COMPRESS KNEE MED (STOCKING) ×2 IMPLANT
SPONGE T-LAP 18X18 ~~LOC~~+RFID (SPONGE) ×2 IMPLANT
SUT FIBERWIRE #2 38 REV NDL BL (SUTURE) ×4
SUT FIBERWIRE #5 38 CONV NDL (SUTURE) ×4
SUT MNCRL AB 3-0 PS2 18 (SUTURE) ×2 IMPLANT
SUT MON AB 2-0 CT1 27 (SUTURE) ×1 IMPLANT
SUT MON AB 2-0 CT1 36 (SUTURE) ×2 IMPLANT
SUT VIC AB 0 CT1 27 (SUTURE) ×6
SUT VIC AB 0 CT1 27XBRD ANBCTR (SUTURE) IMPLANT
SUT VIC AB 1 CT1 27 (SUTURE) ×6
SUT VIC AB 1 CT1 27XBRD ANBCTR (SUTURE) ×2 IMPLANT
SUT VICRYL 0 TIES 12 18 (SUTURE) ×2 IMPLANT
SUTURE FIBERWR #5 38 CONV NDL (SUTURE) ×2 IMPLANT
SUTURE FIBERWR#2 38 REV NDL BL (SUTURE) ×2 IMPLANT
SYR CONTROL 10ML LL (SYRINGE) IMPLANT
TOWEL GREEN STERILE FF (TOWEL DISPOSABLE) ×4 IMPLANT
TUBE CONNECTING 20X1/4 (TUBING) ×2 IMPLANT
UNDERPAD 30X36 HEAVY ABSORB (UNDERPADS AND DIAPERS) ×2 IMPLANT

## 2021-10-16 NOTE — Discharge Instructions (Addendum)
Discharge instructions for Dr. Georgeanna Harrison, M.D., Orthopaedic Surgeon, [place]:  Diet: As you were doing prior to hospitalization, unless instructed otherwise by medical team, dietary/nutrition team, etc. Dressing:  Unless otherwise specified, may change your dressing 5 days after surgery.  In most cases skin glue is used and no additional dressing is necessary.  If there are sticky tapes (steri-strips) on your wounds and all the stitches are absorbable.  Leave the steri-strips in place when changing your dressings, they will peel off with time, usually 2-3 weeks. Shower:  Unless otherwise specified, may shower but keep the wounds dry, use an occlusive plastic wrap, NO SOAKING IN TUB.  If the bandage gets wet, change with a clean dry gauze.  Unless otherwise specified, after 5 days dressing(s) should be removed (7 days for PREVENA dressings) and wound(s) may get wet in the shower by allowing water to gently run over.  Again, no soaking in tub, and do NOT submerge for at least 2 weeks!!! Activity:  Increase activity slowly as tolerated.  If you right leg is injured or immobilized, no driving for 6 weeks or until discussed with your surgeon.  If you have an injury or immobilization of the left lower extremity you may not operate a clutch. Please note that driving with any kind of immobilization for the upper extremity (sling, shoulder brace, splint, cast, etc.) may also be considered impaired driving and should not be attempted. Weight Bearing: TOUCH-DOWN WEIGHTBEARING (TDWB) on the RIGHT LOWER EXTREMITY.  Hinged knee brace locked in extension all times.  (Weightbearing as tolerated left lower extremity.) To prevent constipation: You may use over-the-counter stool softener(s) such as Colace (over the counter) 100 mg by mouth twice a day and/or Miralax (over the counter) for constipation as needed.  Drink plenty of fluids (prune juice may be helpful) and high fiber foods.   Itching:  If you experience itching with your medications, try taking only a single pain pill, or even half a pain pill at a time.  You can also use benadryl over the counter for itching or also to help with sleep.  Precautions:  If you experience chest pain or shortness of breath - call 911 immediately for transfer to the hospital emergency department!! Medications: Please contact the clinic during office hours (Monday through Friday, 0800 to 1600) if you need a refill on any medications.  Please monitor medications and allow 24 to 48 hours to process refill request!!!!  Please note that only medications directly related to the surgery can be prescribed.  For other medications (e.g. blood pressure medicines, sleeping medicines, etc.), please contact the prescribing physician or your primary care provider. DVT Prophylaxis: Continue DVT prophylaxis per left hip surgery  PLEASE REFER TO DR. Dierdre Highman PRINTED DISCHARGE INSTRUCTIONS PLACED IN PATIENT'S HARD CHART ON 10/17/2021!  If you develop a fever greater that 101.1 deg F, purulent drainage from wound, increased redness or drainage from wound, or calf pain -- Call the office at [phone].

## 2021-10-16 NOTE — Progress Notes (Signed)
Alert and oriented x4, Losartan given per Dr. Aline August orders. Compliant with medication administration. No complaints of pain, no signs or symptoms of distress. Patient in a pleasant mood. Patient discharged from [place] today.   Yehuda Mao, LPN

## 2021-10-16 NOTE — Op Note (Signed)
OPERATIVE NOTE  [name] female 56 y.o. 10/16/2021  PREOPERATIVE DIAGNOSIS: Right quadriceps rupture Left osteoporotic hip fracture s/p hemiarthroplasty at outside hospital End-stage renal disease on hemodialysis Hypertension Diabetes Parathyroidism  POSTOPERATIVE DIAGNOSIS: Right quadriceps rupture Left osteoporotic hip fracture s/p hemiarthroplasty at outside hospital End-stage renal disease on hemodialysis Hypertension Diabetes Parathyroidism  PROCEDURE(S): Right quadriceps repair ([id number])  SURGEON: Georgeanna Harrison, M.D.  ASSISTANT(S): None  ANESTHESIA: General  FINDINGS: Preoperative Examination: Palpable defect superior to superior patellar pole.  Unable to maintain straight leg raise.  Normal dorsiflexion, plantar flexion, and EHL motion and strength.  Sensation intact to light touch superficial peroneal, deep peroneal, and tibial distributions.  Foot warm and well perfused.   Operative Findings: Intact medial lateral patellar retinaculum.  Intact superficial quadriceps fibers, with complete avulsion of the deep fibers.  Full reduction of the quadriceps to the patella with stable repair.  IMPLANTS: * No implants in log *  INDICATIONS:  The patient is a 56 y.o. female who is in inpatient rehab following left hip hemiarthroplasty for femoral neck fracture performed at outside facility when she sustained a right quadriceps rupture.  She understood the risks, benefits and alternatives to surgery which include but are not limited to bleeding, wound healing complications, infection, damage to surrounding structures, persistent pain, stiffness, lack of improvement, potential for subsequent arthritis or worsening of pre-existing arthritis, and need for further surgery, as well as complications related to anesthesia, cardiovascular complications, and death.  She also understood the potential for continued pain in that there were no guarantees of acceptable outcome.  After  weighing these risks the patient opted to proceed with surgery.  TECHNIQUE: Patient was identified in the preoperative holding area.  The right knee was marked by myself.  Consent was signed by myself and the patient.  No block was performed by anesthesia in the preoperative holding area.  Patient was taken to the operative suite and placed supine on the operative table.  Anesthesia was induced by the anesthesia team.  The patient was positioned appropriately for the procedure and all bony prominences were well padded.  A non-sterile thigh tourniquet was placed on the operative extremity.  Preoperative antibiotics were given. The extremity was prepped and draped in the usual sterile fashion and surgical timeout was performed.  Right lower extremity exsanguinated with Esmarch bandage.  Tourniquet inflated 300 mmHg.  Midline incision marked out over the knee.  Skin incised sharply.  Underlying fat subcutaneous tissues dissected with Bovie electrocautery and curved Mayo's.  The medial and lateral retinacular tissues were intact.  There is likewise a thin layer of superficial quadriceps tendon/retinaculum intact to the patella.  The intact remnant was incised sharply across the superior pole of the patella.  The underlying avulsed fibers were exposed.  A thin sliver of superior patella pole cortex had apparently avulsed, and bleeding cancellous bone was already exposed.  Krakw stitches were placed in the patellar tendon.  3 parallel drill holes were drilled through the patella.  Slits through the patellar tendon in line with the fibers were created at the site of the exit holes distally.  The knee joint was copiously irrigated and cleared of all hematoma.  The 2 central limbs were passed through the tibia and the central tunnel and medial lateral limbs were passed through the medial and lateral tunnels respectively.  The central limbs were retrieved underneath the patellar tendon with their medial and lateral pairs.   With tension on 1 pair of sutures, square knots were  tied, bringing the quadriceps tendon snugly back down onto the superior pole of the patella.  The other pair sutures tied.  The slits in the patellar tendon were closed with 0 Vicryl suture, burying the knots below the tendon.  The medial and lateral aspects of the quadriceps tendon were sutured back down into the retinaculum with #2 FiberWire in figure-of-eight fashion.  The retinacular tissue was sutured back together across the superior pole of the patella with a running 0 Vicryl.  Wound was again copiously irrigated and hemostasis was obtained.  Wound was closed in layers with simple inverted interrupted 0 Vicryl deep, followed by simple inverted interrupted 2-0 Monocryl deep dermal, followed by running 3 Monocryl subcuticular.  Skin was sealed with Dermabond.  Aquacel dressing was placed.  Tourniquet was let down.  Ace bandage was placed over the right lower extremity, and a fitted hinged knee brace was placed to stabilize the repair locked in extension.  Before the patient off the table, fluoroscopy C arm unit was brought in and x-ray was obtained of the contralateral hip which demonstrated located well fixed hemiarthroplasty prosthesis without any new fractures.  Patient was awakened from anesthesia and transferred to PACU in stable condition.  She tolerated the procedure well.  There were no complications.  POST OPERATIVE INSTRUCTIONS: Mobility: Left lower extremity per previous recommendations of outside surgeon; touchdown weightbearing right lower extremity in knee immobilizer Pain control: Continue to wean/titrate to appropriate oral regimen DVT Prophylaxis: Continue previous DVT prophylaxis as per recommendations of Dr. Elyn Aquas. Harlow Mares, MD Further surgical plans: None RUE: None LUE: None RLE: Touchdown weightbearing in knee immobilizer locked in extension LLE: Per Dr. Elyn Aquas. Harlow Mares, MD Disposition: Return to inpatient rehab Dressing  care:  Keep dressing on for 5 days and then removed.  Wound is sealed with Dermabond and no additional dressings required. Follow-up: Please call [facility] [phone]) to schedule follow-op appointment for 2 weeks after surgery.  TOURNIQUET TIME: 65 minutes  BLOOD LOSS: 25 mL         DRAINS: none         SPECIMEN: none       COMPLICATIONS:  * No complications entered in OR log *         DISPOSITION: PACU - hemodynamically stable.         CONDITION: stable   Georgeanna Harrison M.D. Orthopaedic Surgery [facility]   Portions of the record have been created with voice recognition software.  Grammatical and punctuation errors, random word insertions, wrong-word or "sound-a-like" substitutions, pronoun errors (inaccuracies and/or substitutions), and/or incomplete sentences may have occurred due to the inherent limitations of voice recognition software.  Not all errors are caught or corrected.  Although every attempt is made to root out erroneous and incomplete transcription, the note may still not fully represent the intent or opinion of the author.  Read the chart carefully and recognize, using context, where errors/substitutions have occurred.  Any questions or concerns about the content of this note or information contained within the body of this dictation should be addressed directly with the author for clarification.

## 2021-10-16 NOTE — Progress Notes (Signed)
°  [facility] Progress Note   Subjective:   Seen in room, no concerns this AM. Denies SOB, CP, palpitations, dizziness, abdominal pain and nausea.   Objective Vitals:   10/15/21 1823 10/15/21 1900 10/15/21 2036 10/16/21 0443  BP: (!) 151/73 (!) 156/64 (!) 148/58 (!) 160/64  Pulse: 93 90 99 88  Resp: 20 17 18 18   Temp:  98.5 F (36.9 C) 98.7 F (37.1 C) 98.3 F (36.8 C)  TempSrc:  Oral Oral Oral  SpO2: 100% 99% 96% 99%  Weight: 77.2 kg     Height:       Physical Exam General:well appearing female, alert and in NAD Heart:RRR, no mrg Lungs:CTAB, nml WOB on RA Abdomen:soft, NTND Extremities:no LE edema Dialysis Access:  R IJ TDC with dry, intact bandage  Additional Objective Labs: Basic Metabolic Panel: Recent Labs  Lab 10/10/21 1459 10/12/21 0649 10/13/21 0811  NA 134* 137 134*  K 4.4 4.4 5.9*  CL 94* 97* 96*  CO2 26 25 23   GLUCOSE 206* 166* 199*  BUN 51* 36* 50*  CREATININE 7.16* 6.08* 7.50*  CALCIUM 8.7* 9.3 9.2  PHOS 4.3 4.3 4.3   Liver Function Tests: Recent Labs  Lab 10/10/21 1459 10/12/21 0649 10/13/21 0811  ALBUMIN 2.9* 2.8* 3.0*   CBC: Recent Labs  Lab 10/10/21 1459 10/12/21 0649 10/13/21 0811  WBC 11.5* 9.2 8.3  NEUTROABS  --  7.4  --   HGB 8.1* 8.3* 8.2*  HCT 24.4* 26.3* 26.4*  MCV 97.6 98.9 100.8*  PLT 266 274 326   CBG: Recent Labs  Lab 10/09/21 1252 10/15/21 1914 10/16/21 0556  GLUCAP 147* 142* 171*    Medications:   acetaminophen  1,000 mg Oral TID   vitamin C  500 mg Oral Daily   Chlorhexidine Gluconate Cloth  6 each Topical Q0600   darbepoetin (ARANESP) injection - DIALYSIS  40 mcg Intravenous Q Fri-HD   diclofenac  1 patch Transdermal BID   diclofenac Sodium  2 g Topical QID   enoxaparin (LOVENOX) injection  30 mg Subcutaneous Q24H   ferric citrate  420 mg Oral TID WC   insulin aspart  0-6 Units Subcutaneous TID WC   losartan  50 mg Oral Daily   methocarbamol  750 mg Oral QID   multivitamin  1 tablet  Oral QHS   Vitamin D (Ergocalciferol)  50,000 Units Oral Q7 days   Dialysis Orders: MWF [facility] [address] [phone])  3h 74min  81kg   Hep 3000 then 800u/hr  RIJ TDC  Assessment/Plan: Debility - rehab per CIR Tendon rupture - RLE. Seen by orthopedics. For surgery today L hip fracture/ R pelvic ramus fx - sp L [facility]arthroplasty on 09/29/21 at [facility] ESRD - on HD mwf. Continue on schedule. Next HD tomorrow BP/ volume - BP elevated. Continue home meds.  Under dry wt, UF as tolerated.  DM2 - per pmd Anemia ckd - Last Hgb 8.2. S/p transfusion. ESA started,  Aranesp 40 on 2/10.  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  Continue binders.  9. Nutrition - currently on regular diet w/fluid restrictions.  Follow labs if K starts trending up change to renal diet.  Elevated pre HD on Monday, if elevated again change back to renal diet.     Anice Paganini, PA-C 10/16/2021, 8:57 AM  [facility] Pager: [phone]

## 2021-10-16 NOTE — Anesthesia Preprocedure Evaluation (Deleted)
Anesthesia Evaluation  Patient identified by MRN, date of birth, ID band Patient awake    Reviewed: Allergy & Precautions, NPO status , Patient's Chart, lab work & pertinent test results  History of Anesthesia Complications (+) PONV and history of anesthetic complications  Airway Mallampati: II  TM Distance: >3 FB Neck ROM: Full    Dental no notable dental hx.    Pulmonary neg shortness of breath, asthma ,    Pulmonary exam normal breath sounds clear to auscultation       Cardiovascular hypertension, (-) anginaNormal cardiovascular exam Rhythm:Regular Rate:Normal     Neuro/Psych PSYCHIATRIC DISORDERS Anxiety  Neuromuscular disease CVA    GI/Hepatic negative GI ROS, Neg liver ROS, GERD  Controlled,  Endo/Other  diabetes, Type 2  Renal/GU DialysisRenal disease     Musculoskeletal   Abdominal   Peds  Hematology  (+) Blood dyscrasia, anemia ,   Anesthesia Other Findings Past Medical History: No date: Anemia     Comment:  vitamin d3 deficiency No date: Anxiety No date: Chronic kidney disease     Comment:  End Stage Renal Disease No date: Diabetes mellitus without complication (HCC) No date: GERD (gastroesophageal reflux disease)     Comment:  nothing over last few years No date: High serum parathyroid hormone (PTH)     Comment:  checked through Dialysis 2000: History of kidney stones No date: Hypertension No date: Neuromuscular disorder (HCC)     Comment:  neuropathy in feet No date: PONV (postoperative nausea and vomiting)     Comment:  severe nausea requiring many doses of post op               antiemetics 10/2017: Stroke [facility])     Comment:  thinks she had a series of mini strokes.right leg up to               right side of face were numb. no loss of consciousness  Past Surgical History: 2007: ABDOMINAL HYSTERECTOMY 12/08/2017: AV FISTULA INSERTION W/ RF MAGNETIC GUIDANCE; N/A     Comment:  Procedure: AV  FISTULA INSERTION W/RF MAGNETIC GUIDANCE;               Surgeon: Katha Cabal, MD;  Location: [place]              CV LAB;  Service: Cardiovascular;  Laterality: N/A; 2004: ROTATOR CUFF REPAIR; Right 2004: SHOULDER CLOSED REDUCTION; Right 02/15/2018: UPPER EXTREMITY VENOGRAPHY; Left     Comment:  Procedure: UPPER EXTREMITY VENOGRAPHY;  Surgeon:               Katha Cabal, MD;  Location: [place] CV LAB;               Service: Cardiovascular;  Laterality: Left;  BMI    Body Mass Index: 30.04 kg/m      Reproductive/Obstetrics negative OB ROS                            Anesthesia Physical  Anesthesia Plan  ASA: 4  Anesthesia Plan: General   Post-op Pain Management: Tylenol PO (pre-op)* and Ketamine IV*   Induction: Intravenous  PONV Risk Score and Plan: 4 or greater and Ondansetron, Dexamethasone, Midazolam, Treatment may vary due to age or medical condition and Diphenhydramine  Airway Management Planned: LMA  Additional Equipment:   Intra-op Plan:   Post-operative Plan: Extubation in OR  Informed Consent: I have reviewed the  patients History and Physical, chart, labs and discussed the procedure including the risks, benefits and alternatives for the proposed anesthesia with the patient or authorized representative who has indicated his/her understanding and acceptance.     Dental advisory given  Plan Discussed with: CRNA  Anesthesia Plan Comments: (Patient refused preop block)      Anesthesia Quick Evaluation

## 2021-10-16 NOTE — H&P (Signed)
PREOPERATIVE H&P  HPI: [name] is a 56 y.o. female who has presented today for surgery, with the diagnosis of R quadriceps tendon tear.  The various methods of treatment have been discussed with the patient and family.  After consideration of risks, benefits, and other options for treatment, the patient has consented to [place] as a surgical intervention.  The patient's history has been reviewed, patient examined, no change in status, stable for surgery.  I have reviewed the patient's chart and labs.  Questions were answered to the patient's satisfaction.    PMH: Past Medical History:  Diagnosis Date   Anemia    vitamin d3 deficiency   Anxiety    Chronic kidney disease    End Stage Renal Disease   Diabetes mellitus without complication (HCC)    GERD (gastroesophageal reflux disease)    nothing over last few years   High serum parathyroid hormone (PTH)    checked through Dialysis   History of kidney stones 2000   Hypertension    Neuromuscular disorder (HCC)    neuropathy in feet   PONV (postoperative nausea and vomiting)    severe nausea requiring many doses of post op antiemetics   Stroke ([place]) 10/2017   thinks she had a series of mini strokes.right leg up to right side of face were numb. no loss of consciousness    Home Medications Allergies  No current facility-administered medications on file prior to encounter.   Current Outpatient Medications on File Prior to Encounter  Medication Sig Dispense Refill   acetaminophen (TYLENOL) 500 MG tablet Take 2 tablets (1,000 mg total) by mouth 3 (three) times daily. 30 tablet 0   ascorbic acid (VITAMIN C) 500 MG tablet Take 1 tablet (500 mg total) by mouth daily.     [START ON 10/17/2021] Darbepoetin Alfa (ARANESP) 40 MCG/0.4ML SOSY injection Inject 0.4 mLs (40 mcg total) into the vein every Friday with hemodialysis. 8.4 mL    diclofenac (FLECTOR) 1.3 % PTCH Place 1 patch onto the skin 2 (two) times daily. 60 patch     diclofenac Sodium (VOLTAREN) 1 % GEL Apply 2 g topically 4 (four) times daily.     enoxaparin (LOVENOX) 30 MG/0.3ML injection Inject 0.3 mLs (30 mg total) into the skin daily. 0 mL    ferric citrate (AURYXIA) 1 GM 210 MG(Fe) tablet Take 1 tablet (210 mg total) by mouth as needed (with snacks). 270 tablet    ferric citrate (AURYXIA) 1 GM 210 MG(Fe) tablet Take 2 tablets (420 mg total) by mouth 3 (three) times daily with meals. 270 tablet    HYDROcodone-acetaminophen (NORCO/VICODIN) 5-325 MG tablet Take 1 tablet by mouth every 4 (four) hours as needed for moderate pain. 30 tablet 0   insulin aspart (NOVOLOG) 100 UNIT/ML injection Inject 0-6 Units into the skin 3 (three) times daily with meals. 10 mL 11   losartan (COZAAR) 50 MG tablet Take 50 mg by mouth daily.      methocarbamol (ROBAXIN) 750 MG tablet Take 1 tablet (750 mg total) by mouth 4 (four) times daily.     multivitamin (RENA-VIT) TABS tablet Take 1 tablet by mouth at bedtime.  0   Nutritional Supplements (FEEDING SUPPLEMENT, NEPRO CARB STEADY,) LIQD Take 237 mLs by mouth 2 (two) times daily between meals.  0   oxyCODONE (OXY IR/ROXICODONE) 5 MG immediate release tablet Take 1 tablet (5 mg total) by mouth every 4 (four) hours as needed for moderate pain. 30 tablet 0  Allergies  Allergen Reactions   Enalapril Hives and Other (See Comments)    Angioedema face.   Ivp Dye [Iodinated Contrast Media] Hives   Lisinopril Shortness Of Breath   Shellfish Allergy Swelling and Other (See Comments)    patient tolerates shellfish by mouth without problem     PSH: Past Surgical History:  Procedure Laterality Date   ABDOMINAL HYSTERECTOMY  2007   AV FISTULA INSERTION W/ RF MAGNETIC GUIDANCE N/A 12/08/2017   Procedure: AV FISTULA INSERTION W/RF MAGNETIC GUIDANCE;  Surgeon: Katha Cabal, MD;  Location: [place] CV LAB;  Service: Cardiovascular;  Laterality: N/A;   DIALYSIS/PERMA CATHETER INSERTION Right 10/03/2021   Procedure:  DIALYSIS/PERMA CATHETER INSERTION;  Surgeon: Katha Cabal, MD;  Location: [place] CV LAB;  Service: Cardiovascular;  Laterality: Right;   HIP ARTHROPLASTY Left 09/29/2021   Procedure: ARTHROPLASTY BIPOLAR HIP (HEMIARTHROPLASTY);  Surgeon: Lovell Sheehan, MD;  Location: [facility] ORS;  Service: Orthopedics;  Laterality: Left;   ROTATOR CUFF REPAIR Right 2004   SHOULDER CLOSED REDUCTION Right 2004   UPPER EXTREMITY VENOGRAPHY Left 02/15/2018   Procedure: UPPER EXTREMITY VENOGRAPHY;  Surgeon: Katha Cabal, MD;  Location: [place] CV LAB;  Service: Cardiovascular;  Laterality: Left;     Family History Social History  Family History  Problem Relation Age of Onset   Emphysema Mother    COPD Mother    Cancer Father    Cancer Paternal 44    Diabetes Sister    Hypertension Sister    Eczema Sister    Diabetes Brother    Hypertension Brother    Cardiomyopathy Brother    Alcohol abuse Brother     Social History   Socioeconomic History   Marital status: Married    Spouse name: [name]   Number of children: 0   Years of education: Not on file   Highest education level: Some college, no degree  Occupational History   Occupation: Clinical cytogeneticist  Tobacco Use   Smoking status: Never   Smokeless tobacco: Never  Vaping Use   Vaping Use: Never used  Substance and Sexual Activity   Alcohol use: No   Drug use: Never   Sexual activity: Yes    Partners: Male  Other Topics Concern   Not on file  Social History Narrative   Not on file   Social Determinants of Health   Financial Resource Strain: Not on file  Food Insecurity: Not on file  Transportation Needs: Not on file  Physical Activity: Not on file  Stress: Not on file  Social Connections: Not on file     Review of Systems: MSK: As noted per HPI above GI: No current Nausea/vomiting ENT: Denies sore throat, epistaxis CV: Denies chest pain Resp: No current shortness of breath  Other than mentioned  above, there are no Constitutional, Neurological, Psychiatric, ENT, Ophthalmological, Cardiovascular, Respiratory, GI, GU, Musculoskeletal, Integumentary, Lymphatic, Endocrine or Allergic issues.   Physical Examination: CV: Normal distal pulses Lungs: Unlabored respirations RLE: Palpable defect superior to superior patellar pole.  Unable to maintain straight leg raise.  Normal dorsiflexion, plantar flexion, and EHL motion and strength.  Sensation intact to light touch superficial peroneal, deep peroneal, and tibial distributions.  Foot warm and well perfused.   Assessment/Plan: REPAIR QUADRICEP TENDON    Georgeanna Harrison M.D. Orthopaedic Surgery [facility]  Review of this patient's medications prescribed by other providers does not in any way constitute an endorsement by this clinician of their  use, indications, dosage, route, efficacy, interactions, or other clinical parameters.  Portions of the record have been created with voice recognition software.  Grammatical and punctuation errors, random word insertions, wrong-word or "sound-a-like" substitutions, pronoun errors (inaccuracies and/or substitutions), and/or incomplete sentences may have occurred due to the inherent limitations of voice recognition software.  Not all errors are caught or corrected.  Although every attempt is made to root out erroneous and incomplete transcription, the note may still not fully represent the intent or opinion of the author.  Read the chart carefully and recognize, using context, where errors/substitutions have occurred.  Any questions or concerns about the content of this note or information contained within the body of this dictation should be addressed directly with the author for clarification.

## 2021-10-16 NOTE — Progress Notes (Signed)
PROGRESS NOTE   Subjective/Complaints: No new complaints this morning Ready for surgery today Hopeful she can come back to rehab soon afterward Bowels moving better   ROS: Constipation now improved, +pain in bilateral lower extremities, +loose incontinent stool improved  Objective:   No results found. No results for input(s): WBC, HGB, HCT, PLT in the last 72 hours.   No results for input(s): NA, K, CL, CO2, GLUCOSE, BUN, CREATININE, CALCIUM in the last 72 hours.    Intake/Output Summary (Last 24 hours) at 10/16/2021 0955 Last data filed at 10/15/2021 1900 Gross per 24 hour  Intake 240 ml  Output 1500 ml  Net -1260 ml         Physical Exam: Vital Signs Blood pressure (!) 160/64, pulse 88, temperature 98.3 F (36.8 C), temperature source Oral, resp. rate 18, height 5\' 7"  (1.702 m), weight 77.2 kg, SpO2 99 %.     General: awake, alert, appropriate, laying in bed- NAD, BMI 26.66 HENT: conjugate gaze; oropharynx moist CV: regular rate; no JVD Pulmonary: CTA B/L; no W/R/R- good air movement GI: much more soft, NT, ND, (+)BS- hypoactive still Psychiatric: appropriate Neurological: Ox3 Musculoskeletal:        General: Swelling and tenderness present.     Cervical back: Normal range of motion and neck supple.     Right lower leg: Edema present.     Left lower leg: Edema present.     Comments: Left hip tender to palpation and PROM with associated swelling. Left knee with mild effusion, patella laxity. Right hip tender with leg raise  Active range of motion by patient with good improvements Right knee tender and swollen Able to get out of bed today Skin:    Comments: Incision site dressing clean dry and intact.  R IJ TDC with bandage Neurological:     Comments: Patient is alert.  No acute distress.  Oriented x3 and follows commands. Alert and oriented x 3. Normal insight and awareness. Intact Memory. Normal  language and speech. Cranial nerve exam unremarkable. UE motor 5/5. LE motor limited proximally by pain. ADF/PF 4-5/5. No sensory findings.   Psychiatric:     Comments: Pleasant, slightly anxious  GU: stool incontinence in diaper   Assessment/Plan: 1. Functional deficits which require 3+ hours per day of interdisciplinary therapy in a comprehensive inpatient rehab setting. Physiatrist is providing close team supervision and 24 hour management of active medical problems listed below. Physiatrist and rehab team continue to assess barriers to discharge/monitor patient progress toward functional and medical goals  Care Tool:  Bathing  Bathing activity did not occur: Safety/medical concerns Body parts bathed by patient: Right arm, Left arm, Chest, Abdomen, Right upper leg, Left upper leg, Face   Body parts bathed by helper: Front perineal area, Buttocks, Right lower leg, Left lower leg     Bathing assist Assist Level: Moderate Assistance - Patient 50 - 74%     Upper Body Dressing/Undressing Upper body dressing   What is the patient wearing?: Pull over shirt    Upper body assist Assist Level: Supervision/Verbal cueing (EOB)    Lower Body Dressing/Undressing Lower body dressing      What is the  patient wearing?: Incontinence brief     Lower body assist Assist for lower body dressing: Moderate Assistance - Patient 50 - 74% (bed level)     Toileting Toileting    Toileting assist Assist for toileting: Maximal Assistance - Patient 25 - 49%     Transfers Chair/bed transfer  Transfers assist     Chair/bed transfer assist level: 2 Helpers (slideboard)     Locomotion Ambulation   Ambulation assist   Ambulation activity did not occur: Safety/medical concerns (weakness, fatigue, pain, anxiety)          Walk 10 feet activity   Assist  Walk 10 feet activity did not occur: Safety/medical concerns (weakness, fatigue, pain, anxiety)        Walk 50 feet  activity   Assist Walk 50 feet with 2 turns activity did not occur: Safety/medical concerns (weakness, fatigue, pain, anxiety)         Walk 150 feet activity   Assist Walk 150 feet activity did not occur: Safety/medical concerns (weakness, fatigue, pain, anxiety)         Walk 10 feet on uneven surface  activity   Assist Walk 10 feet on uneven surfaces activity did not occur: Safety/medical concerns (weakness, fatigue, pain, anxiety)         Wheelchair     Assist Is the patient using a wheelchair?: Yes Type of Wheelchair: Manual Wheelchair activity did not occur: Safety/medical concerns (weakness, fatigue, pain, anxiety)         Wheelchair 50 feet with 2 turns activity    Assist    Wheelchair 50 feet with 2 turns activity did not occur: Safety/medical concerns (weakness, fatigue, pain, anxiety)       Wheelchair 150 feet activity     Assist  Wheelchair 150 feet activity did not occur: Safety/medical concerns (weakness, fatigue, pain, anxiety)       Blood pressure (!) 160/64, pulse 88, temperature 98.3 F (36.8 C), temperature source Oral, resp. rate 18, height 5\' 7"  (1.702 m), weight 77.2 kg, SpO2 99 %.    Medical Problem List and Plan: 1. Functional deficits secondary to nondisplaced fracture right inferior pubic ramus as well as a displaced foreshortened fracture through the left femoral neck/intertrochanteric area.  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   -Weightbearing as tolerated with anterior total hip precautions.                                  -Conservative care right inferior pubic ramus fracture, weightbearing as tolerated             -patient may  shower             -ELOS/Goals: 10-14 days, supervision to min assist goals  Discussed with team plan for surgery on Thursday. NPO at midnight on Wednesday  D.c to surgery today  -HFU scheduled 2.  Antithrombotics: -DVT/anticoagulation:  Mechanical: Antiembolism  stockings, thigh (TED hose) Bilateral lower extremities check vascular study.   -should be able to begin lovenox today as permacath placed            -antiplatelet therapy: N/A 3. Femur fracture pain: continue Flector patch,Robaxin 750 mg 4 times daily, hydrocodone and oxycodone as needed             -pt having significant spasms in left thigh.             -add kpad to help with spasms  -  schedule tylenol 1000mg  TID 4. Anxiety: Instructed on deep breathing techniques. Provide emotional support.  Melatonin as needed             -antipsychotic agents: N/A 5. Neuropsych: This patient is capable of making decisions on her own behalf. 6. Skin/Wound Care: Routine skin checks 7. Fluids/Electrolytes/Nutrition: Routine in and outs with follow-up chemistries 8.  End-stage renal disease/hemodialysis.  Permacath exchanged 10/03/2021.    -  Follow-up hemodialysis per Dr. [name] -HD later in day to allow participation in therapies during the day 9.  Acute on chronic anemia.  Has had transfusions already x 2. Hgb has stabilized in 7 range. Continue iron supplement.              -epo per nephrology 10.  Diabetes mellitus with peripheral neuropathy. Placed order that she may use her Dexcom 6. Hemoglobin A1c 6.2.  SSI as prior to admission. Discuss Qutenza. Discussed with nursing to continue insulin despite NPO given her elevated CBG 11.  Hypertension.  Discussed with nursing to continue Cozaar 50 mg daily despite NPO given elevated SBP.  12.  Hyperparathyroidism of renal origin.  Plan outpatient parathyroidectomy per Dr.Lateef 13.  Overweight.  BMI 29.11.  Dietary follow-up. Provided list of foods that can help with weight loss.  14. Cushingoid?--outpt work up 73. Constipation: resolved. Decrease colace to daily prn. D/c senna. Discussed benefits of high fiber foods 16. Right quadriceps tear: plan for surgery Thursday- discussed with team. Precautions as per ortho. NPO at midnight on Wednesday. Discussed patient  will likely return to rehab after post-surgical monitoring/clearance from her surgeon.  17. Gas: continue simethicone 18. Ileus: resolved, regular diet resumed -tolerating.  19. Abd spasms: resolved 20. Black tarry stools: discussed heme occult negative last night. Hgb reviewed and stable.    >30 minutes spent in discharge of patient including review of medications and follow-up appointments, physical examination, and in answering all patient's questions   LOS: 10 days A FACE TO [place] 10/16/2021, 9:55 AM

## 2021-10-16 NOTE — Anesthesia Procedure Notes (Addendum)
Procedure Name: LMA Insertion Date/Time: 10/16/2021 10:04 AM Performed by: Terrence Dupont, CRNA Pre-anesthesia Checklist: Patient identified, Emergency Drugs available, Suction available and Patient being monitored Patient Re-evaluated:Patient Re-evaluated prior to induction Oxygen Delivery Method: Circle system utilized Preoxygenation: Pre-oxygenation with 100% oxygen Induction Type: IV induction Ventilation: Mask ventilation without difficulty LMA: LMA inserted LMA Size: 4.0 Tube type: Oral Number of attempts: 1 Placement Confirmation: ETT inserted through vocal cords under direct vision, positive ETCO2 and breath sounds checked- equal and bilateral Tube secured with: Tape Dental Injury: Teeth and Oropharynx as per pre-operative assessment

## 2021-10-16 NOTE — Progress Notes (Signed)
After receiving verification from NP Lovenox was held last pm due to patient going to surgery today. Patient made aware of why Lovenox being held and she was reminded that she was NPO after MN and she stated understanding.

## 2021-10-16 NOTE — Anesthesia Preprocedure Evaluation (Signed)
Anesthesia Evaluation  Patient identified by MRN, date of birth, ID band Patient awake    Reviewed: Allergy & Precautions, NPO status , Patient's Chart, lab work & pertinent test results  History of Anesthesia Complications (+) PONV and history of anesthetic complications  Airway Mallampati: II  TM Distance: >3 FB Neck ROM: Full    Dental  (+) Teeth Intact, Dental Advisory Given   Pulmonary neg shortness of breath, asthma ,    Pulmonary exam normal breath sounds clear to auscultation       Cardiovascular hypertension, (-) anginaNormal cardiovascular exam Rhythm:Regular Rate:Normal     Neuro/Psych PSYCHIATRIC DISORDERS Anxiety  Neuromuscular disease CVA    GI/Hepatic negative GI ROS, Neg liver ROS, GERD  Controlled,  Endo/Other  diabetes, Type 2  Renal/GU DialysisRenal disease     Musculoskeletal   Abdominal   Peds  Hematology  (+) Blood dyscrasia, anemia ,   Anesthesia Other Findings Past Medical History: No date: Anemia     Comment:  vitamin d3 deficiency No date: Anxiety No date: Chronic kidney disease     Comment:  End Stage Renal Disease No date: Diabetes mellitus without complication (HCC) No date: GERD (gastroesophageal reflux disease)     Comment:  nothing over last few years No date: High serum parathyroid hormone (PTH)     Comment:  checked through Dialysis 2000: History of kidney stones No date: Hypertension No date: Neuromuscular disorder (HCC)     Comment:  neuropathy in feet No date: PONV (postoperative nausea and vomiting)     Comment:  severe nausea requiring many doses of post op               antiemetics 10/2017: Stroke [facility])     Comment:  thinks she had a series of mini strokes.right leg up to               right side of face were numb. no loss of consciousness  Past Surgical History: 2007: ABDOMINAL HYSTERECTOMY 12/08/2017: AV FISTULA INSERTION W/ RF MAGNETIC GUIDANCE; N/A      Comment:  Procedure: AV FISTULA INSERTION W/RF MAGNETIC GUIDANCE;               Surgeon: Katha Cabal, MD;  Location: [place]              CV LAB;  Service: Cardiovascular;  Laterality: N/A; 2004: ROTATOR CUFF REPAIR; Right 2004: SHOULDER CLOSED REDUCTION; Right 02/15/2018: UPPER EXTREMITY VENOGRAPHY; Left     Comment:  Procedure: UPPER EXTREMITY VENOGRAPHY;  Surgeon:               Katha Cabal, MD;  Location: [place] [facility];               Service: Cardiovascular;  Laterality: Left;  BMI    Body Mass Index: 30.04 kg/m      Reproductive/Obstetrics negative OB ROS                             Anesthesia Physical  Anesthesia Plan  ASA: 4  Anesthesia Plan: General   Post-op Pain Management: Tylenol PO (pre-op)* and Ketamine IV*   Induction: Intravenous  PONV Risk Score and Plan: 4 or greater and Ondansetron, Dexamethasone, Midazolam, Treatment may vary due to age or medical condition and Diphenhydramine  Airway Management Planned: LMA  Additional Equipment:   Intra-op Plan:   Post-operative Plan: Extubation in OR  Informed Consent: I  have reviewed the patients History and Physical, chart, labs and discussed the procedure including the risks, benefits and alternatives for the proposed anesthesia with the patient or authorized representative who has indicated his/her understanding and acceptance.     Dental advisory given  Plan Discussed with: CRNA  Anesthesia Plan Comments: (Patient refused preop block)        Anesthesia Quick Evaluation

## 2021-10-16 NOTE — H&P (Signed)
HISTORY AND PHYSICAL       PATIENT DETAILS Name: [name] Age: 56 y.o. Sex: female Date of Birth: 30-Aug-1966 Admit Date: 10/16/2021 PCP:Pcp, No   Patient coming from: PACU   CHIEF COMPLAINT:  Direct admit from PACU after right quadriceps tendon repair.  HPI: [name] is a 56 y.o. female with medical history significant of ESRD on HD MWF, DM-2, HTN who recently sustained a left hip fracture-and underwent left hip hemiarthroplasty at [facility] on 1/30-she was subsequently discharged to CIR on 2/6.  While at CIR-she was noted to have worsening right knee pain, MRI imaging showed high-grade near full-thickness tear of the distal quadriceps tendon-orthopedics was subsequently consulted, patient underwent quadriceps tendon today-following which-the hospitalist service was asked to admit the patient to the hospital as patient was not able to go back to CIR.  When seen in the postoperative unit-patient was awake, alert and denied any major issues.  She did not have chest pain, shortness of breath, nausea, vomiting or diarrhea.  Per orthopedics, she is weightbearing as tolerated in the left lower extremity, and touchdown weightbearing on her right lower extremity.  Patient requested that narcotics be minimized as much as possible.   REVIEW OF SYSTEMS:  Constitutional:   No  weight loss, night sweats,  Fevers, chills, fatigue.  HEENT:    No headaches, Dysphagia,Tooth/dental problems,Sore throat,  No sneezing, itching, ear ache, nasal congestion, post nasal drip  Cardio-vascular: No chest pain,Orthopnea, PND,lower extremity edema, anasarca, palpitations  GI:  No heartburn, indigestion, abdominal pain, nausea, vomiting, diarrhea, melena or hematochezia  Resp: No shortness of breath, cough, hemoptysis,plueritic chest pain.   Skin:  No rash or lesions.  GU:  No dysuria, change in color of urine, no urgency or frequency.  No flank pain.  Musculoskeletal: No joint pain  or swelling.  Endocrine: No heat intolerance, no cold intolerance, no polyuria, no polydipsia  Psych: No change in mood or affect. No depression or anxiety.  No memory loss.   ALLERGIES:   Allergies  Allergen Reactions   Enalapril Hives and Other (See Comments)    Angioedema face.   Ivp Dye [Iodinated Contrast Media] Hives   Lisinopril Shortness Of Breath   Shellfish Allergy Swelling and Other (See Comments)    patient tolerates shellfish by mouth without problem    PAST MEDICAL HISTORY: Past Medical History:  Diagnosis Date   Anemia    vitamin d3 deficiency   Anxiety    Chronic kidney disease    End Stage Renal Disease   Diabetes mellitus without complication (HCC)    GERD (gastroesophageal reflux disease)    nothing over last few years   High serum parathyroid hormone (PTH)    checked through Dialysis   History of kidney stones 2000   Hypertension    Neuromuscular disorder (HCC)    neuropathy in feet   PONV (postoperative nausea and vomiting)    severe nausea requiring many doses of post op antiemetics   Stroke ([place]) 10/2017   thinks she had a series of mini strokes.right leg up to right side of face were numb. no loss of consciousness    PAST SURGICAL HISTORY: Past Surgical History:  Procedure Laterality Date   ABDOMINAL HYSTERECTOMY  2007   AV FISTULA INSERTION W/ RF MAGNETIC GUIDANCE N/A 12/08/2017   Procedure: AV FISTULA INSERTION W/RF MAGNETIC GUIDANCE;  Surgeon: Katha Cabal, MD;  Location: [place] [facility];  Service: Cardiovascular;  Laterality: N/A;  DIALYSIS/PERMA CATHETER INSERTION Right 10/03/2021   Procedure: DIALYSIS/PERMA CATHETER INSERTION;  Surgeon: Katha Cabal, MD;  Location: [place] [facility];  Service: Cardiovascular;  Laterality: Right;   HIP ARTHROPLASTY Left 09/29/2021   Procedure: ARTHROPLASTY BIPOLAR HIP (HEMIARTHROPLASTY);  Surgeon: Lovell Sheehan, MD;  Location: [facility];  Service: Orthopedics;  Laterality: Left;    ROTATOR CUFF REPAIR Right 2004   SHOULDER CLOSED REDUCTION Right 2004   UPPER EXTREMITY VENOGRAPHY Left 02/15/2018   Procedure: UPPER EXTREMITY VENOGRAPHY;  Surgeon: Katha Cabal, MD;  Location: [place] [facility];  Service: Cardiovascular;  Laterality: Left;    MEDICATIONS AT HOME: Prior to Admission medications   Medication Sig Start Date End Date Taking? Authorizing Provider  acetaminophen (TYLENOL) 500 MG tablet Take 2 tablets (1,000 mg total) by mouth 3 (three) times daily. 10/13/21   Angiulli, Lavon Paganini, PA-C  ascorbic acid (VITAMIN C) 500 MG tablet Take 1 tablet (500 mg total) by mouth daily. 10/13/21   Angiulli, Lavon Paganini, PA-C  Darbepoetin Alfa (ARANESP) 40 MCG/0.4ML SOSY injection Inject 0.4 mLs (40 mcg total) into the vein every Friday with hemodialysis. 10/17/21   Angiulli, Lavon Paganini, PA-C  diclofenac (FLECTOR) 1.3 % PTCH Place 1 patch onto the skin 2 (two) times daily. 10/13/21   Angiulli, Lavon Paganini, PA-C  diclofenac Sodium (VOLTAREN) 1 % GEL Apply 2 g topically 4 (four) times daily. 10/13/21   Angiulli, Lavon Paganini, PA-C  enoxaparin (LOVENOX) 30 MG/0.3ML injection Inject 0.3 mLs (30 mg total) into the skin daily. 10/13/21   Angiulli, Lavon Paganini, PA-C  ferric citrate (AURYXIA) 1 GM 210 MG(Fe) tablet Take 1 tablet (210 mg total) by mouth as needed (with snacks). 10/13/21   Angiulli, Lavon Paganini, PA-C  ferric citrate (AURYXIA) 1 GM 210 MG(Fe) tablet Take 2 tablets (420 mg total) by mouth 3 (three) times daily with meals. 10/13/21   Angiulli, Lavon Paganini, PA-C  HYDROcodone-acetaminophen (NORCO/VICODIN) 5-325 MG tablet Take 1 tablet by mouth every 4 (four) hours as needed for moderate pain. 10/13/21   Angiulli, Lavon Paganini, PA-C  insulin aspart (NOVOLOG) 100 UNIT/ML injection Inject 0-6 Units into the skin 3 (three) times daily with meals. 10/13/21   Angiulli, Lavon Paganini, PA-C  losartan (COZAAR) 50 MG tablet Take 50 mg by mouth daily.  08/26/18   Holley Raring Munsoor, MD  methocarbamol (ROBAXIN) 750 MG tablet Take  1 tablet (750 mg total) by mouth 4 (four) times daily. 10/06/21   Nita Sells, MD  multivitamin (RENA-VIT) TABS tablet Take 1 tablet by mouth at bedtime. 10/06/21   Nita Sells, MD  Nutritional Supplements (FEEDING SUPPLEMENT, NEPRO CARB STEADY,) LIQD Take 237 mLs by mouth 2 (two) times daily between meals. 10/06/21   Nita Sells, MD  oxyCODONE (OXY IR/ROXICODONE) 5 MG immediate release tablet Take 1 tablet (5 mg total) by mouth every 4 (four) hours as needed for moderate pain. 10/13/21   Angiulli, Lavon Paganini, PA-C    FAMILY HISTORY: Family History  Problem Relation Age of Onset   Emphysema Mother    COPD Mother    Cancer Father    Cancer Paternal Aunt    Diabetes Sister    Hypertension Sister    Eczema Sister    Diabetes Brother    Hypertension Brother    Cardiomyopathy Brother    Alcohol abuse Brother       SOCIAL HISTORY:  reports that she has never smoked. She has never used smokeless tobacco. She reports that she does not drink alcohol  and does not use drugs.  PHYSICAL EXAM: Blood pressure (!) 152/64, pulse 88, temperature 98.4 F (36.9 C), resp. rate 13, SpO2 97 %.  General appearance :Awake, alert, not in any distress.  Eyes:, pupils equally reactive to light and accomodation,no scleral icterus.Pink conjunctiva HEENT: Atraumatic and Normocephalic Neck: supple, no JVD.  Resp:Good air entry bilaterally, no added sounds  CVS: S1 S2 regular, no murmurs.  GI: Bowel sounds present, Non tender and not distended with no gaurding, rigidity or rebound. Extremities: B/L Lower Ext shows no edema, both legs are warm to touch Neurology:  speech clear,Non focal Psychiatric: Normal judgment and insight. Alert and oriented x 3. Normal mood. Musculoskeletal:gait appears to be normal.No digital cyanosis Skin:No Rash, warm and dry Wounds:N/A  LABS ON ADMISSION:  I have personally reviewed following labs and imaging studies  CBC: Recent Labs  Lab 10/10/21 1459  10/12/21 0649 10/13/21 0811 10/16/21 1335  WBC 11.5* 9.2 8.3  --   NEUTROABS  --  7.4  --   --   HGB 8.1* 8.3* 8.2* 10.9*  HCT 24.4* 26.3* 26.4* 32.0*  MCV 97.6 98.9 100.8*  --   PLT 266 274 326  --     Basic Metabolic Panel: Recent Labs  Lab 10/10/21 1459 10/12/21 0649 10/13/21 0811 10/16/21 1335  NA 134* 137 134* 136  K 4.4 4.4 5.9* 4.4  CL 94* 97* 96* 100  CO2 26 25 23   --   GLUCOSE 206* 166* 199* 166*  BUN 51* 36* 50* 31*  CREATININE 7.16* 6.08* 7.50* 5.40*  CALCIUM 8.7* 9.3 9.2  --   PHOS 4.3 4.3 4.3  --     GFR: Estimated Creatinine Clearance: 12.3 mL/min (A) (by C-G formula based on SCr of 5.4 mg/dL (H)).  Liver Function Tests: Recent Labs  Lab 10/10/21 1459 10/12/21 0649 10/13/21 0811  ALBUMIN 2.9* 2.8* 3.0*   No results for input(s): LIPASE, AMYLASE in the last 168 hours. No results for input(s): AMMONIA in the last 168 hours.  Coagulation Profile: No results for input(s): INR, PROTIME in the last 168 hours.  Cardiac Enzymes: No results for input(s): CKTOTAL, CKMB, CKMBINDEX, TROPONINI in the last 168 hours.  BNP (last 3 results) No results for input(s): PROBNP in the last 8760 hours.  HbA1C: No results for input(s): HGBA1C in the last 72 hours.  CBG: Recent Labs  Lab 10/15/21 1914 10/16/21 0556 10/16/21 1221 10/16/21 1654  GLUCAP 142* 171* 167* 172*    Lipid Profile: No results for input(s): CHOL, HDL, LDLCALC, TRIG, CHOLHDL, LDLDIRECT in the last 72 hours.  Thyroid Function Tests: No results for input(s): TSH, T4TOTAL, FREET4, T3FREE, THYROIDAB in the last 72 hours.  Anemia Panel: No results for input(s): VITAMINB12, FOLATE, FERRITIN, TIBC, IRON, RETICCTPCT in the last 72 hours.  Urine analysis: No results found for: COLORURINE, APPEARANCEUR, LABSPEC, PHURINE, GLUCOSEU, HGBUR, BILIRUBINUR, KETONESUR, PROTEINUR, UROBILINOGEN, NITRITE, LEUKOCYTESUR  Sepsis Labs: Lactic Acid, Venous No results found for:  [place]   Microbiology: No results found for this or any previous visit (from the past 240 hour(s)).    RADIOLOGIC STUDIES ON ADMISSION: DG C-Arm 1-60 Min-No Report  Result Date: 10/16/2021 Fluoroscopy was utilized by the requesting physician.  No radiographic interpretation.   DG HIP UNILAT WITH PELVIS 2-3 VIEWS LEFT  Result Date: 10/16/2021 CLINICAL DATA:  Left hip  REPAIR QUADRICEP TENDON EXAM: DG HIP (WITH OR WITHOUT PELVIS) 2-3V LEFT COMPARISON:  X-ray left hip 10/02/2021 FINDINGS: Intraoperative repair of the quadriceps tendon per history.  One low resolution intraoperative spot views of the left hip were obtained in a patient status post total left hip arthroplasty. No fracture visible on the limited views. Total fluoroscopy time: 10.3 seconds Total radiation dose: 1.8 mGy IMPRESSION: Intraoperative repair of the quadriceps tendon in a patient status post total left hip arthroplasty. Electronically Signed   By: Iven Finn M.D.   On: 10/16/2021 17:12      EKG:    ASSESSMENT AND PLAN: Full-thickness tear of the right quadriceps tendon: S/p repair by orthopedics on 2/16-postoperative care deferred to orthopedics-Per orthopedics-patient should be touchdown weightbearing on RLE.  Per my discussion with orthopedics-initially plans were to transfer patient back to CIR postoperatively-however that is now not possible-and patient will need to be admitted to the hospitalist service-and rehab services will need to be reconsulted.  Unsafe to discharge home at this point as she is touchdown weightbearing on RLE-and weightbearing as tolerated on her left lower extremity due to recent hip fracture.  Recent left hip fracture-s/p left hip hemiarthroplasty on 1/30: Weightbearing as tolerated.  ESRD on HD MWF: Nephrology has been following closely-patient is scheduled for HD tomorrow-we will have to touch base with nephrology tomorrow morning.  HTN: Resume losartan.  Follow BP trend and  adjust/titrate medications accordingly.  DM-2: Resume sliding scale insulin-follow CBGs and titrate accordingly.  Normocytic anemia: Due to ESRD-we will defer use of Aranesp/IV iron to the nephrology service. Further plan will depend as patient's clinical course evolves and further radiologic and laboratory data become available. Patient will be monitored closely.  Above noted plan was discussed with patient face to face at bedside, she was in agreement.   CONSULTS: Orthopedics Nephrology   DVT Prophylaxis: Prophylactic Heparin to start tomorrow  Code Status: Full Code  Disposition Plan: CIR   Admission status:  Inpatient medical floor   The patient is critically ill with multiple organ system failure and requires high complexity decision making for assessment and support, frequent evaluation and titration of therapies, advanced monitoring, review of radiographic studies and interpretation of complex data.   The medical decision making on this patient was of high complexity and the patient is at high risk for clinical deterioration, therefore this is a level 3 visit.    Total time spent  55 minutes.Greater than 50% of this time was spent in counseling, explanation of diagnosis, planning of further management, and coordination of care.  Severity of illness: The appropriate patient status for this patient is INPATIENT. Inpatient status is judged to be reasonable and necessary in order to provide the required intensity of service to ensure the patient's safety. The patient's presenting symptoms, physical exam findings, and initial radiographic and laboratory data in the context of their chronic comorbidities is felt to place them at high risk for further clinical deterioration. Furthermore, it is not anticipated that the patient will be medically stable for discharge from the hospital within 2 midnights of admission. The following factors support the patient status of inpatient.    " The patient's presenting symptoms include right quadriceps tendon tear " The worrisome physical exam findings include pain in RLE/EnBrace. " The initial radiographic and laboratory data are worrisome because of . " The chronic co-morbidities include ESRD, DM-2, HTN.   * I certify that at the point of admission it is my clinical judgment that the patient will require inpatient hospital care spanning beyond 2 midnights from the point of admission due to high intensity of service, high risk for further deterioration and  high frequency of surveillance required.**  Armani Brar [facility] Pager [phone]  If 7PM-7AM, please contact night-coverage  Please page via [email or web address]  Go to [email or web address] and use [place]'s universal password to access. If you do not have the password, please contact the hospital operator.  Locate the [facility] provider you are looking for under [facility] and page to a number that you can be directly reached. If you still have difficulty reaching the provider, please page the [facility] (Director on Call) for the Hospitalists listed on amion for assistance.  10/16/2021, 5:36 PM

## 2021-10-16 NOTE — Transfer of Care (Signed)
Immediate Anesthesia Transfer of Care Note  Patient: [name]  Procedure(s) Performed: REPAIR QUADRICEP TENDON (Right: Knee)  Patient Location: PACU  Anesthesia Type:General  Level of Consciousness: awake and alert   Airway & Oxygen Therapy: Patient Spontanous Breathing and Patient connected to nasal cannula oxygen  Post-op Assessment: Report given to RN and Post -op Vital signs reviewed and stable  Post vital signs: Reviewed  Last Vitals:  Vitals Value Taken Time  BP 158/71 10/16/21 1645  Temp    Pulse 90 10/16/21 1645  Resp 20 10/16/21 1645  SpO2 100 % 10/16/21 1645  Vitals shown include unvalidated device data.  Last Pain: There were no vitals filed for this visit.       Complications: No notable events documented.

## 2021-10-17 ENCOUNTER — Encounter (HOSPITAL_COMMUNITY): Payer: Self-pay | Admitting: Orthopedic Surgery

## 2021-10-17 ENCOUNTER — Other Ambulatory Visit: Payer: Self-pay

## 2021-10-17 ENCOUNTER — Inpatient Hospital Stay (HOSPITAL_COMMUNITY): Payer: Medicare Other

## 2021-10-17 ENCOUNTER — Inpatient Hospital Stay (HOSPITAL_REHABILITATION)
Admission: RE | Admit: 2021-10-17 | Discharge: 2021-11-08 | Disposition: A | Discharge status: Home / Self Care | Payer: Medicare Other | Source: Intra-hospital | Attending: Physical Medicine and Rehabilitation | Admitting: Physical Medicine and Rehabilitation

## 2021-10-17 DIAGNOSIS — S76121A Laceration of right quadriceps muscle, fascia and tendon, initial encounter: Secondary | ICD-10-CM | POA: Diagnosis not present

## 2021-10-17 DIAGNOSIS — D649 Anemia, unspecified: Secondary | ICD-10-CM

## 2021-10-17 DIAGNOSIS — R52 Pain, unspecified: Secondary | ICD-10-CM

## 2021-10-17 DIAGNOSIS — S76111A Strain of right quadriceps muscle, fascia and tendon, initial encounter: Secondary | ICD-10-CM

## 2021-10-17 DIAGNOSIS — I10 Essential (primary) hypertension: Secondary | ICD-10-CM | POA: Diagnosis not present

## 2021-10-17 DIAGNOSIS — E1022 Type 1 diabetes mellitus with diabetic chronic kidney disease: Secondary | ICD-10-CM

## 2021-10-17 DIAGNOSIS — E1142 Type 2 diabetes mellitus with diabetic polyneuropathy: Secondary | ICD-10-CM

## 2021-10-17 DIAGNOSIS — Z992 Dependence on renal dialysis: Secondary | ICD-10-CM

## 2021-10-17 DIAGNOSIS — N186 End stage renal disease: Secondary | ICD-10-CM | POA: Diagnosis not present

## 2021-10-17 DIAGNOSIS — S72142A Displaced intertrochanteric fracture of left femur, initial encounter for closed fracture: Secondary | ICD-10-CM | POA: Diagnosis present

## 2021-10-17 LAB — GLUCOSE, CAPILLARY
Glucose-Capillary: 170 mg/dL — ABNORMAL HIGH (ref 70–99)
Glucose-Capillary: 165 mg/dL — ABNORMAL HIGH (ref 70–99)
Glucose-Capillary: 265 mg/dL — ABNORMAL HIGH (ref 70–99)

## 2021-10-17 LAB — RENAL FUNCTION PANEL
Albumin: 3.3 g/dL — ABNORMAL LOW (ref 3.5–5.0)
Anion gap: 15 (ref 5–15)
BUN: 43 mg/dL — ABNORMAL HIGH (ref 6–20)
CO2: 23 mmol/L (ref 22–32)
Calcium: 9.2 mg/dL (ref 8.9–10.3)
Chloride: 96 mmol/L — ABNORMAL LOW (ref 98–111)
Creatinine, Ser: 5.91 mg/dL — ABNORMAL HIGH (ref 0.44–1.00)
GFR, Estimated: 8 mL/min — ABNORMAL LOW (ref >60)
Glucose, Bld: 255 mg/dL — ABNORMAL HIGH (ref 70–99)
Phosphorus: 4.4 mg/dL (ref 2.5–4.6)
Potassium: 5.2 mmol/L — ABNORMAL HIGH (ref 3.5–5.1)
Sodium: 134 mmol/L — ABNORMAL LOW (ref 135–145)

## 2021-10-17 LAB — CBC
HCT: 27.3 % — ABNORMAL LOW (ref 36.0–46.0)
Hemoglobin: 8.9 g/dL — ABNORMAL LOW (ref 12.0–15.0)
MCH: 32 pg (ref 26.0–34.0)
MCHC: 32.6 g/dL (ref 30.0–36.0)
MCV: 98.2 fL (ref 80.0–100.0)
Platelets: 279 10*3/uL (ref 150–400)
RBC: 2.78 MIL/uL — ABNORMAL LOW (ref 3.87–5.11)
RDW: 15.3 % (ref 11.5–15.5)
WBC: 11.8 10*3/uL — ABNORMAL HIGH (ref 4.0–10.5)
nRBC: 0 % (ref 0.0–0.2)

## 2021-10-17 MED ORDER — NEPRO/CARBSTEADY PO LIQD
237.0000 mL | Freq: Two times a day (BID) | ORAL | Status: DC
  Administered 2021-10-17 – 2021-10-21 (×6): 237 mL via ORAL
  Filled 2021-10-17 (×2): qty 237
  Filled 2021-10-17: qty 711

## 2021-10-17 MED ORDER — DARBEPOETIN ALFA 40 MCG/0.4ML IJ SOSY
40.0000 ug | PREFILLED_SYRINGE | INTRAMUSCULAR | Status: DC
  Administered 2021-10-17: 40 ug via INTRAVENOUS
  Filled 2021-10-17 (×2): qty 0.4

## 2021-10-17 MED ORDER — LOSARTAN POTASSIUM 50 MG PO TABS
50.0000 mg | ORAL_TABLET | Freq: Every day | ORAL | Status: DC
  Administered 2021-10-18 – 2021-10-26 (×7): 50 mg via ORAL
  Filled 2021-10-17 (×8): qty 1

## 2021-10-17 MED ORDER — POLYETHYLENE GLYCOL 3350 17 G PO PACK
17.0000 g | PACK | Freq: Every day | ORAL | Status: DC
  Administered 2021-10-18 – 2021-10-20 (×3): 17 g via ORAL
  Filled 2021-10-17 (×4): qty 1

## 2021-10-17 MED ORDER — ASCORBIC ACID 500 MG PO TABS
500.0000 mg | ORAL_TABLET | Freq: Every day | ORAL | Status: DC
  Administered 2021-10-18 – 2021-10-21 (×4): 500 mg via ORAL
  Filled 2021-10-17 (×4): qty 1

## 2021-10-17 MED ORDER — HYDROCODONE-ACETAMINOPHEN 5-325 MG PO TABS: 1.0000 | ORAL_TABLET | ORAL | Status: DC | PRN

## 2021-10-17 MED ORDER — ACETAMINOPHEN 325 MG PO TABS: 325.0000 mg | ORAL_TABLET | Freq: Four times a day (QID) | ORAL | Status: DC | PRN

## 2021-10-17 MED ORDER — ALBUTEROL SULFATE (2.5 MG/3ML) 0.083% IN NEBU: 2.5000 mg | INHALATION_SOLUTION | RESPIRATORY_TRACT | Status: DC | PRN

## 2021-10-17 MED ORDER — ACETAMINOPHEN 500 MG PO TABS
500.0000 mg | ORAL_TABLET | Freq: Four times a day (QID) | ORAL | Status: DC
  Administered 2021-10-17: 500 mg via ORAL
  Filled 2021-10-17 (×2): qty 1

## 2021-10-17 MED ORDER — ONDANSETRON HCL 4 MG PO TABS: 4.0000 mg | ORAL_TABLET | Freq: Four times a day (QID) | ORAL | Status: DC | PRN

## 2021-10-17 MED ORDER — HYDROCODONE-ACETAMINOPHEN 7.5-325 MG PO TABS: 1.0000 | ORAL_TABLET | ORAL | Status: DC | PRN

## 2021-10-17 MED ORDER — INSULIN ASPART 100 UNIT/ML IJ SOLN
0.0000 [IU] | Freq: Three times a day (TID) | INTRAMUSCULAR | Status: DC
  Administered 2021-10-18: 3 [IU] via SUBCUTANEOUS
  Administered 2021-10-18: 2 [IU] via SUBCUTANEOUS
  Administered 2021-10-18 – 2021-10-19 (×2): 3 [IU] via SUBCUTANEOUS
  Administered 2021-10-19 (×2): 2 [IU] via SUBCUTANEOUS
  Administered 2021-10-20 (×2): 3 [IU] via SUBCUTANEOUS
  Administered 2021-10-20: 7 [IU] via SUBCUTANEOUS
  Administered 2021-10-21: 2 [IU] via SUBCUTANEOUS
  Administered 2021-10-21 – 2021-10-22 (×3): 3 [IU] via SUBCUTANEOUS
  Administered 2021-10-22 – 2021-10-23 (×3): 2 [IU] via SUBCUTANEOUS
  Administered 2021-10-23: 3 [IU] via SUBCUTANEOUS
  Administered 2021-10-23: 1 [IU] via SUBCUTANEOUS
  Administered 2021-10-24 – 2021-10-25 (×5): 2 [IU] via SUBCUTANEOUS
  Administered 2021-10-25 – 2021-10-26 (×2): 1 [IU] via SUBCUTANEOUS
  Administered 2021-10-26 – 2021-10-27 (×4): 2 [IU] via SUBCUTANEOUS
  Administered 2021-10-28: 1 [IU] via SUBCUTANEOUS
  Administered 2021-10-28 – 2021-10-29 (×5): 2 [IU] via SUBCUTANEOUS
  Administered 2021-10-30: 3 [IU] via SUBCUTANEOUS
  Administered 2021-10-30: 2 [IU] via SUBCUTANEOUS
  Administered 2021-10-30 – 2021-11-01 (×3): 1 [IU] via SUBCUTANEOUS
  Administered 2021-11-02: 2 [IU] via SUBCUTANEOUS
  Administered 2021-11-02 – 2021-11-05 (×6): 1 [IU] via SUBCUTANEOUS
  Administered 2021-11-05: 2 [IU] via SUBCUTANEOUS
  Administered 2021-11-06: 13:00:00 1 [IU] via SUBCUTANEOUS

## 2021-10-17 MED ORDER — HYDROCODONE-ACETAMINOPHEN 5-325 MG PO TABS: 1.0000 | ORAL_TABLET | Freq: Four times a day (QID) | ORAL | 0 refills | Status: DC | PRN

## 2021-10-17 MED ORDER — ONDANSETRON HCL 4 MG/2ML IJ SOLN: 4.0000 mg | Freq: Four times a day (QID) | INTRAMUSCULAR | Status: DC | PRN

## 2021-10-17 MED ORDER — ACETAMINOPHEN 325 MG PO TABS
325.0000 mg | ORAL_TABLET | Freq: Four times a day (QID) | ORAL | Status: DC | PRN
  Administered 2021-10-19 – 2021-10-23 (×9): 650 mg via ORAL
  Filled 2021-10-17 (×12): qty 2

## 2021-10-17 MED ORDER — ENOXAPARIN SODIUM 30 MG/0.3ML IJ SOSY: 30.0000 mg | PREFILLED_SYRINGE | INTRAMUSCULAR | Status: DC

## 2021-10-17 MED ORDER — ENOXAPARIN SODIUM 30 MG/0.3ML IJ SOSY
30.0000 mg | PREFILLED_SYRINGE | INTRAMUSCULAR | Status: DC
  Administered 2021-10-17 – 2021-11-04 (×19): 30 mg via SUBCUTANEOUS
  Filled 2021-10-17 (×21): qty 0.3

## 2021-10-17 MED ORDER — RENA-VITE PO TABS
1.0000 | ORAL_TABLET | Freq: Every day | ORAL | Status: DC
  Administered 2021-10-17 – 2021-10-20 (×4): 1 via ORAL
  Filled 2021-10-17 (×4): qty 1

## 2021-10-17 MED ORDER — DOCUSATE SODIUM 100 MG PO CAPS
100.0000 mg | ORAL_CAPSULE | Freq: Two times a day (BID) | ORAL | Status: DC
  Administered 2021-10-17: 100 mg via ORAL
  Filled 2021-10-17: qty 1

## 2021-10-17 MED ORDER — MORPHINE SULFATE (PF) 2 MG/ML IV SOLN: 0.5000 mg | INTRAVENOUS | Status: DC | PRN

## 2021-10-17 MED ORDER — CEFAZOLIN SODIUM-DEXTROSE 1-4 GM/50ML-% IV SOLN
1.0000 g | Freq: Four times a day (QID) | INTRAVENOUS | Status: AC
  Administered 2021-10-17 (×2): 1 g via INTRAVENOUS
  Filled 2021-10-17 (×2): qty 50

## 2021-10-17 MED ORDER — METHOCARBAMOL 750 MG PO TABS
750.0000 mg | ORAL_TABLET | Freq: Three times a day (TID) | ORAL | Status: DC | PRN
  Administered 2021-10-19 – 2021-11-08 (×35): 750 mg via ORAL
  Filled 2021-10-17 (×39): qty 1

## 2021-10-17 MED ORDER — CHLORHEXIDINE GLUCONATE CLOTH 2 % EX PADS
6.0000 | MEDICATED_PAD | Freq: Every day | CUTANEOUS | Status: DC
  Administered 2021-10-17: 6 via TOPICAL

## 2021-10-17 MED ORDER — DOCUSATE SODIUM 100 MG PO CAPS
100.0000 mg | ORAL_CAPSULE | Freq: Two times a day (BID) | ORAL | Status: DC
  Administered 2021-10-17 – 2021-10-20 (×7): 100 mg via ORAL
  Filled 2021-10-17 (×8): qty 1

## 2021-10-17 MED ORDER — ONDANSETRON HCL 4 MG PO TABS
4.0000 mg | ORAL_TABLET | Freq: Four times a day (QID) | ORAL | Status: DC | PRN
  Filled 2021-10-17: qty 1

## 2021-10-17 MED ORDER — HYDROCODONE-ACETAMINOPHEN 5-325 MG PO TABS
1.0000 | ORAL_TABLET | ORAL | Status: DC | PRN
  Administered 2021-10-17 – 2021-10-27 (×7): 2 via ORAL
  Filled 2021-10-17 (×11): qty 2

## 2021-10-17 MED ORDER — HEPARIN SODIUM (PORCINE) 1000 UNIT/ML IJ SOLN
INTRAMUSCULAR | Status: AC
  Filled 2021-10-17: qty 4

## 2021-10-17 NOTE — Progress Notes (Addendum)
Inpatient Rehabilitation Admission Medication Review by a Pharmacist  A complete drug regimen review was completed for this patient to identify any potential clinically significant medication issues.  High Risk Drug Classes Is patient taking? Indication by Medication  Antipsychotic No   Anticoagulant Yes Lovenox for DVT px  Antibiotic No   Opioid Yes vicodin for pain  Antiplatelet No   Hypoglycemics/insulin Yes SSI for DM  Vasoactive Medication Yes Losartan for HTN  Chemotherapy No   Other Yes Robaxin - spasms     Type of Medication Issue Identified Description of Issue Recommendation(s)  Drug Interaction(s) (clinically significant)     Duplicate Therapy     Allergy     No Medication Administration End Date     Incorrect Dose     Additional Drug Therapy Needed  Aranesp, lidocaine patch, oxycodone (hydrocodone ordered) Resume Aranesp, hold lidocaine patch and vicodin order - Dr Ranell Patrick  Significant med changes from prior encounter (inform family/care partners about these prior to discharge).    Other       Clinically significant medication issues were identified that warrant physician communication and completion of prescribed/recommended actions by midnight of the next day:  [facility]  Name of provider notified for urgent issues identified: Dr. Ranell Patrick  Provider Method of Notification: secure chat  Pharmacist comments:   Time spent performing this drug regimen review (minutes):  62min   Onnie Boer, PharmD, [place], AAHIVP, CPP Infectious Disease Pharmacist 10/17/2021 1:45 PM

## 2021-10-17 NOTE — Discharge Summary (Signed)
PATIENT DETAILS Name: Grayson White Age: 56 y.o. Sex: female Date of Birth: 1965-12-02 MRN: [id number]. Admitting Physician: Jonetta Osgood, MD PCP:Pcp, No  Admit Date: 10/16/2021 Discharge date: 10/17/2021  Recommendations for Outpatient Follow-up:  Follow up with PCP in 1-2 weeks Please obtain CMP/CBC in one week Please ensure follow-up with orthopedics.  Admitted From:  CIR  Disposition: CIR   Discharge Condition: fair  CODE STATUS:   Code Status: Full Code   Diet recommendation:  Diet Order             Diet Carb Modified Fluid consistency: Thin; Room service appropriate? Yes  Diet effective now           Diet - low sodium heart healthy           Diet Carb Modified                    Brief Summary:  56 y.o. female with medical history significant of ESRD on HD MWF, DM-2, HTN who recently sustained a left hip fracture-and underwent left hip hemiarthroplasty at [facility] on 1/30-she was subsequently discharged to CIR on 2/6.  While at CIR-she was noted to have worsening right knee pain, MRI imaging showed high-grade near full-thickness tear of the distal quadriceps tendon-orthopedics was subsequently consulted, patient underwent quadriceps tendon today-following which-the hospitalist service was asked to admit the patient to the hospital as patient was not able to go back to CIR.  Brief Hospital Course: Full-thickness tear of the right quadriceps tendon: S/p repair on 2/16-Per orthopedics-touchdown weightbearing on RLE.  Pain control with narcotics-on SQ heparin for VTE prophylaxis.  Please reach out to orthopedic service for further needs if they arise.  Recent left hip fracture-s/p left hip hemiarthroplasty on 1/30: Weightbearing as tolerated.  Continue SQ heparin for VTE prophylaxis.   ESRD on HD MWF: Nephrology continues to follow.   HTN: BP stable-continue losartan.    DM-2: CBG stable with SSI.   Normocytic anemia: Due to ESRD-we will defer use of  Aranesp/IV iron to the nephrology   BMI: Estimated body mass index is 26.66 kg/m as calculated from the following:   Height as of 10/06/21: 5\' 7"  (1.702 m).   Weight as of 10/15/21: 77.2 kg.   Discharge Diagnoses:  Principal Problem:   Quadriceps tendon rupture Active Problems:   End stage renal disease on dialysis ([place])   Type 1 diabetes mellitus with chronic kidney disease on chronic dialysis [facility])   Essential hypertension   Femur fracture [facility])   Discharge Instructions:  Activity:  RLE-touchdown weightbearing LLE-weightbearing as tolerated  Discharge Instructions     Diet - low sodium heart healthy   Complete by: As directed    Diet Carb Modified   Complete by: As directed    Discharge wound care:   Complete by: As directed    Keep dressing on for 5 days and then removed.  Wound is sealed with Dermabond and no additional dressings required.   Increase activity slowly   Complete by: As directed       Allergies as of 10/17/2021       Reactions   Enalapril Hives, Other (See Comments)   Angioedema face.   Ivp Dye [iodinated Contrast Media] Hives   Lisinopril Shortness Of Breath   Shellfish Allergy Swelling, Other (See Comments)   patient tolerates shellfish by mouth without problem        Medication List     STOP taking these medications  Darbepoetin Alfa 40 MCG/0.4ML Sosy injection Commonly known as: ARANESP   enoxaparin 30 MG/0.3ML injection Commonly known as: LOVENOX   ferric citrate 1 GM 210 MG(Fe) tablet Commonly known as: AURYXIA       TAKE these medications    acetaminophen 500 MG tablet Commonly known as: TYLENOL Take 2 tablets (1,000 mg total) by mouth 3 (three) times daily.   ascorbic acid 500 MG tablet Commonly known as: VITAMIN C Take 1 tablet (500 mg total) by mouth daily.   diclofenac 1.3 % Ptch Commonly known as: FLECTOR Place 1 patch onto the skin 2 (two) times daily.   diclofenac Sodium 1 % Gel Commonly known as:  VOLTAREN Apply 2 g topically 4 (four) times daily.   feeding supplement (NEPRO CARB STEADY) Liqd Take 237 mLs by mouth 2 (two) times daily between meals.   HYDROcodone-acetaminophen 5-325 MG tablet Commonly known as: NORCO/VICODIN Take 1 tablet by mouth every 4 (four) hours as needed for moderate pain.   insulin aspart 100 UNIT/ML injection Commonly known as: novoLOG Inject 0-6 Units into the skin 3 (three) times daily with meals.   losartan 50 MG tablet Commonly known as: COZAAR Take 50 mg by mouth daily.   methocarbamol 750 MG tablet Commonly known as: ROBAXIN Take 1 tablet (750 mg total) by mouth 4 (four) times daily.   multivitamin Tabs tablet Take 1 tablet by mouth at bedtime.   oxyCODONE 5 MG immediate release tablet Commonly known as: Oxy IR/ROXICODONE Take 1 tablet (5 mg total) by mouth every 4 (four) hours as needed for moderate pain.               Discharge Care Instructions  (From admission, onward)           Start     Ordered   10/17/21 0000  Discharge wound care:       Comments: Keep dressing on for 5 days and then removed.  Wound is sealed with Dermabond and no additional dressings required.   10/17/21 1016            Follow-up Information     Georgeanna Harrison, MD. Schedule an appointment as soon as possible for a visit in 2 week(s).   Specialty: Orthopedic Surgery Contact information: [address] [place] [address] [phone]         [facility] Follow up.   Specialty: Family Medicine Contact information: [place] [address] [phone]               Allergies  Allergen Reactions   Enalapril Hives and Other (See Comments)    Angioedema face.   Ivp Dye [Iodinated Contrast Media] Hives   Lisinopril Shortness Of Breath   Shellfish Allergy Swelling and Other (See Comments)    patient tolerates shellfish by mouth without problem     Other  Procedures/Studies: DG Abd 1 View  Result Date: 10/13/2021 CLINICAL DATA:  Ileus EXAM: ABDOMEN - 1 VIEW COMPARISON:  10/13/2011 FINDINGS: Vascular catheter tip over the low right atrium. Nonobstructed gas pattern with mild stool. Left hip replacement. IMPRESSION: Nonobstructed gas pattern Electronically Signed   By: Donavan Foil M.D.   On: 10/13/2021 15:19   DG Abd 1 View  Result Date: 10/11/2021 CLINICAL DATA:  Abdominal spasm. Black tarry stools and constipation after hip surgery. Evaluate for ileus or constipation. EXAM: ABDOMEN - 1 VIEW COMPARISON:  06/09/2019 FINDINGS: There is mild gaseous distension of the large  and small bowel loops. A moderate stool burden is also identified within the colon. No signs of bowel obstruction. IMPRESSION: Mild gaseous distension of the large and small bowel loops, likely reflecting mild ileus. Electronically Signed   By: Kerby Moors M.D.   On: 10/11/2021 11:02   CT Hip Left Wo Contrast  Result Date: 09/28/2021 CLINICAL DATA:  Fall, left hip fracture EXAM: CT OF THE LEFT HIP WITHOUT CONTRAST TECHNIQUE: Multidetector CT imaging of the left hip was performed according to the standard protocol. Multiplanar CT image reconstructions were also generated. RADIATION DOSE REDUCTION: This exam was performed according to the departmental dose-optimization program which includes automated exposure control, adjustment of the mA and/or kV according to patient size and/or use of iterative reconstruction technique. COMPARISON:  X-ray 09/28/2021, CT 06/09/2019 FINDINGS: Bones/Joint/Cartilage Marked diffuse osteopenia. Acute transcervical fracture through the left femoral neck with significant varus angulation. No definite intertrochanteric involvement. Hip joint alignment is maintained without dislocation. Acute nondisplaced fracture of the right inferior pubic ramus (series 2, image 91). No additional fractures are identified. Moderate arthropathy of the pubic symphysis with new  erosive or resorptive changes of the bilateral pubic bones. Symphysis is mildly widened compared to previous CT. Ligaments Suboptimally assessed by CT. Muscles and Tendons No acute musculotendinous abnormality by CT. Soft tissues Mild soft tissue swelling at the femoral neck fracture site. No organized hematoma. No acute findings within the visualized portion of the left hemipelvis. IMPRESSION: 1. Acute transcervical fracture through the LEFT femoral neck with varus angulation. 2. Acute nondisplaced fracture of the RIGHT inferior pubic ramus. 3. Moderate arthropathy of the pubic symphysis with new resorptive or erosive changes of the bilateral pubic bones. This may be secondary to underlying renal osteodystrophy. Septic arthritis could have this appearance in the appropriate clinical setting. Mild widening at the pubic symphysis is favored secondary to underlying arthropathy/resorption rather than posttraumatic diastasis. 4. Marked bony demineralization. Electronically Signed   By: Davina Poke D.O.   On: 09/28/2021 18:56   MR KNEE RIGHT WO CONTRAST  Result Date: 10/09/2021 CLINICAL DATA:  Right knee pain after fall.  Abnormal x-ray EXAM: MRI OF THE RIGHT KNEE WITHOUT CONTRAST TECHNIQUE: Multiplanar, multisequence MR imaging of the knee was performed. No intravenous contrast was administered. COMPARISON:  X-ray 10/07/2021 FINDINGS: Technical Note: Despite efforts by the technologist and patient, motion artifact is present on today's exam and could not be eliminated. This reduces exam sensitivity and specificity. MENISCI Medial meniscus:  Intact. Lateral meniscus:  Intact. LIGAMENTS Cruciates: Intact ACL and PCL. Collaterals: Intact MCL. Lateral collateral ligament complex intact. CARTILAGE Patellofemoral: Irregular full-thickness cartilage loss involving the superior aspect of the lateral and medial patellar facets, which may be degenerative or posttraumatic. High-grade near-full thickness cartilage defect  within the central trochlear groove measuring approximately 11 x 5 mm. Medial: Mild diffuse chondral thinning of the weight-bearing medial compartment. Lateral:  No chondral defect. MISCELLANEOUS Joint: Large complex knee joint effusion. Thickened, edematous appearance of the suprapatellar fat pad. Popliteal Fossa:  No Baker's cyst. Intact popliteus tendon. Extensor Mechanism: High-grade, near full-thickness tear of the distal quadriceps tendon with less than 1 cm of retraction. The superficial most aspect of the distal tendon remains intact. Low signal cortical avulsion fragments are also avulsed from the superior cortex of the patella (series 12, image 17). Laxity of the patellar tendon without tear. Bones: Cortical avulsion of the superior pole of the patella. Patella baja alignment. Patchy marrow edema within the distal femur and proximal tibia are nonspecific  and could be posttraumatic or related to osteopenia. Previously described area of relative decreased density in the proximal tibial metaphysis is consistent with fatty bone marrow. Similar findings are seen within the distal femoral metaphysis (series 9, image 17). No suspicious marrow replacing bone lesion. Other: Diffuse soft tissue swelling and edema with small amount of ill-defined hemorrhage along the anterolateral aspect of the knee. IMPRESSION: 1. High-grade, near full-thickness mildly retracted tear of the distal quadriceps tendon with associated cortical avulsion of the superior pole of the patella. 2. Large complex knee joint effusion. 3. Patchy marrow edema within the distal femur and proximal tibia are nonspecific and could be posttraumatic or related to osteopenia. 4. Previously described area of decreased density in the proximal tibial metaphysis is consistent with fatty bone marrow. No suspicious marrow replacing bone lesion. 5. Patellofemoral compartment osteoarthritis with high-grade near-full-thickness cartilage defects as described  above. 6. Intact menisci.  Intact cruciate and collateral ligaments. Electronically Signed   By: Davina Poke D.O.   On: 10/09/2021 08:18   PERIPHERAL VASCULAR CATHETERIZATION  Result Date: 10/03/2021 See surgical note for result.  DG Chest Port 1 View  Result Date: 09/28/2021 CLINICAL DATA:  Fall EXAM: PORTABLE CHEST 1 VIEW COMPARISON:  None. FINDINGS: The cardiomediastinal silhouette is enlarged in contour.RIGHT chest CVC tip terminating over the superior cavoatrial junction. Enlarged appearance of bilateral hilar contours most consistent with enlarged pulmonary arteries and likely underlying pulmonary arterial hypertension. Elevation of the RIGHT hemidiaphragm. No pleural effusion. No pneumothorax. No acute pleuroparenchymal abnormality. Visualized abdomen is unremarkable. Osteopenia. IMPRESSION: 1. Cardiomegaly with enlarged hilar contours likely reflecting enlarged pulmonary arteries and underlying pulmonary arterial hypertension. Electronically Signed   By: Valentino Saxon M.D.   On: 09/28/2021 17:05   DG Knee Right Port  Result Date: 10/07/2021 CLINICAL DATA:  Trauma, fall, pain EXAM: PORTABLE RIGHT KNEE - 1-2 VIEW COMPARISON:  None. FINDINGS: No recent fracture or dislocation is seen. There is small to moderate effusion in the suprapatellar bursa. Erosive change seen in the posterior margin of patella may be due to degenerative arthritis. There are faint calcifications in the quadriceps tendon close to patella, possibly calcific tendinosis or bursitis. Osteopenia is seen in bony structures. In the AP view, there is 4.2 x 3.1 cm area of subtle decreased density with possible disruption of trabeculae in the proximal shaft of tibia. There is no break in the cortical margins. IMPRESSION: No recent fracture or dislocation is seen. Small to moderate effusion is present in the suprapatellar bursa. There is ill-defined 4.2 x 3.1 cm area of decreased density with possible disruption of trabeculae in  the central portion of proximal shaft of tibia. This may be due to osteopenia or suggest a space-occupying lesion. Comparison with previous studies or follow-up CT or MRI may be considered. Electronically Signed   By: Elmer Picker M.D.   On: 10/07/2021 15:20   DG Abd Portable 1V  Result Date: 10/12/2021 CLINICAL DATA:  Constipation. EXAM: PORTABLE ABDOMEN - 1 VIEW COMPARISON:  10/11/2021 FINDINGS: 1121 hours. Interval decrease in gas and stool within the colon. Mild central small bowel distension is similar to prior. Bones are diffusely demineralized. IMPRESSION: Interval decrease in stool volume in the colon. Electronically Signed   By: Misty Stanley M.D.   On: 10/12/2021 14:09   DG C-Arm 1-60 Min-No Report  Result Date: 10/16/2021 Fluoroscopy was utilized by the requesting physician.  No radiographic interpretation.   DG C-Arm 1-60 Min-No Report  Result Date: 09/29/2021 Fluoroscopy was utilized  by the requesting physician.  No radiographic interpretation.   DG HIP UNILAT WITH PELVIS 1V LEFT  Result Date: 09/29/2021 CLINICAL DATA:  Left hip arthroplasty, fracture left femur EXAM: DG HIP (WITH OR WITHOUT PELVIS) 1V*L* COMPARISON:  09/28/2021 FINDINGS: Fluoroscopic images show interval left hip arthroplasty. Fluoroscopic time was 6 seconds. Radiation dose is 0.67 mGy. IMPRESSION: Fluoroscopic assistance was provided for left hip arthroplasty. Electronically Signed   By: Elmer Picker M.D.   On: 09/29/2021 17:02   DG HIP UNILAT WITH PELVIS 2-3 VIEWS LEFT  Result Date: 10/16/2021 CLINICAL DATA:  Left hip  REPAIR QUADRICEP TENDON EXAM: DG HIP (WITH OR WITHOUT PELVIS) 2-3V LEFT COMPARISON:  X-ray left hip 10/02/2021 FINDINGS: Intraoperative repair of the quadriceps tendon per history. One low resolution intraoperative spot views of the left hip were obtained in a patient status post total left hip arthroplasty. No fracture visible on the limited views. Total fluoroscopy time: 10.3 seconds  Total radiation dose: 1.8 mGy IMPRESSION: Intraoperative repair of the quadriceps tendon in a patient status post total left hip arthroplasty. Electronically Signed   By: Iven Finn M.D.   On: 10/16/2021 17:12   DG HIP UNILAT WITH PELVIS 2-3 VIEWS LEFT  Result Date: 10/02/2021 CLINICAL DATA:  Left hip pain. EXAM: DG HIP (WITH OR WITHOUT PELVIS) 2-3V LEFT COMPARISON:  Left hip fluoroscopy 09/29/2021 FINDINGS: Interval total left hip arthroplasty. No perihardware lucency is seen to indicate hardware failure or loosening. Mild-to-moderate right femoroacetabular joint space narrowing. There is diffuse decreased bone mineralization. Expected postoperative changes of the left hip including lateral subcutaneous air and lateral surgical skin staples. IMPRESSION: Interval total left hip arthroplasty without evidence of hardware failure. Electronically Signed   By: Yvonne Kendall M.D.   On: 10/02/2021 13:08   DG Hip Unilat W or Wo Pelvis 2-3 Views Left  Result Date: 09/28/2021 CLINICAL DATA:  Fall EXAM: DG HIP (WITH OR WITHOUT PELVIS) 2-3V LEFT COMPARISON:  None. FINDINGS: Osteopenia. There is a displaced fracture through the neck and intratrochanteric LEFT femur with superior translocation of the distal femur. Femoral head appears to be seated within the acetabulum although evaluation is limited by body habitus. No additional fracture noted. Limited assessment of the sacrum secondary to overlapping bowel contents and profound osteopenia. IMPRESSION: Displaced foreshortened fracture of the neck and inter trochanteric LEFT femur. Electronically Signed   By: Valentino Saxon M.D.   On: 09/28/2021 17:04     TODAY-DAY OF DISCHARGE:  Subjective:   [name] today has no headache,no chest abdominal pain,no new weakness tingling or numbness, feels much better wants to go home today.  Objective:   Blood pressure (!) 158/78, pulse 90, temperature 97.9 F (36.6 C), temperature source Oral, resp. rate 18,  SpO2 98 %.  Intake/Output Summary (Last 24 hours) at 10/17/2021 1016 Last data filed at 10/17/2021 0343 Gross per 24 hour  Intake 300 ml  Output 25 ml  Net 275 ml   There were no vitals filed for this visit.  Exam: Awake Alert, Oriented *3, No new F.N deficits, Normal affect [place].AT,PERRAL Supple Neck,No JVD, No cervical lymphadenopathy appriciated.  Symmetrical Chest wall movement, Good air movement bilaterally, CTAB RRR,No Gallops,Rubs or new Murmurs, No Parasternal Heave +ve B.Sounds, Abd Soft, Non tender, No organomegaly appriciated, No rebound -guarding or rigidity. No Cyanosis, Clubbing or edema, No new Rash or bruise   PERTINENT RADIOLOGIC STUDIES: DG C-Arm 1-60 Min-No Report  Result Date: 10/16/2021 Fluoroscopy was utilized by the requesting physician.  No radiographic  interpretation.   DG HIP UNILAT WITH PELVIS 2-3 VIEWS LEFT  Result Date: 10/16/2021 CLINICAL DATA:  Left hip  REPAIR QUADRICEP TENDON EXAM: DG HIP (WITH OR WITHOUT PELVIS) 2-3V LEFT COMPARISON:  X-ray left hip 10/02/2021 FINDINGS: Intraoperative repair of the quadriceps tendon per history. One low resolution intraoperative spot views of the left hip were obtained in a patient status post total left hip arthroplasty. No fracture visible on the limited views. Total fluoroscopy time: 10.3 seconds Total radiation dose: 1.8 mGy IMPRESSION: Intraoperative repair of the quadriceps tendon in a patient status post total left hip arthroplasty. Electronically Signed   By: Iven Finn M.D.   On: 10/16/2021 17:12     PERTINENT LAB RESULTS: CBC: Recent Labs    10/16/21 1335 10/17/21 0139  WBC  --  11.8*  HGB 10.9* 8.9*  HCT 32.0* 27.3*  PLT  --  279   CMET CMP     Component Value Date/Time   NA 134 (L) 10/17/2021 0139   K 5.2 (H) 10/17/2021 0139   CL 96 (L) 10/17/2021 0139   CO2 23 10/17/2021 0139   GLUCOSE 255 (H) 10/17/2021 0139   BUN 43 (H) 10/17/2021 0139   CREATININE 5.91 (H) 10/17/2021 0139    CALCIUM 9.2 10/17/2021 0139   PROT 7.1 04/25/2018 1300   ALBUMIN 3.3 (L) 10/17/2021 0139   AST 12 04/25/2018 1300   ALT 15 04/25/2018 1300   BILITOT 0.3 04/25/2018 1300   GFRNONAA 8 (L) 10/17/2021 0139   GFRAA 8 (L) 12/06/2017 1505    GFR Estimated Creatinine Clearance: 11.2 mL/min (A) (by C-G formula based on SCr of 5.91 mg/dL (H)). No results for input(s): LIPASE, AMYLASE in the last 72 hours. No results for input(s): CKTOTAL, CKMB, CKMBINDEX, TROPONINI in the last 72 hours. Invalid input(s): POCBNP No results for input(s): DDIMER in the last 72 hours. No results for input(s): HGBA1C in the last 72 hours. No results for input(s): CHOL, HDL, LDLCALC, TRIG, CHOLHDL, LDLDIRECT in the last 72 hours. No results for input(s): TSH, T4TOTAL, T3FREE, THYROIDAB in the last 72 hours.  Invalid input(s): FREET3 No results for input(s): VITAMINB12, FOLATE, FERRITIN, TIBC, IRON, RETICCTPCT in the last 72 hours. Coags: No results for input(s): INR in the last 72 hours.  Invalid input(s): PT Microbiology: No results found for this or any previous visit (from the past 240 hour(s)).  FURTHER DISCHARGE INSTRUCTIONS:  Get Medicines reviewed and adjusted: Please take all your medications with you for your next visit with your Primary MD  Laboratory/radiological data: Please request your Primary MD to go over all hospital tests and procedure/radiological results at the follow up, please ask your Primary MD to get all Hospital records sent to his/her office.  In some cases, they will be blood work, cultures and biopsy results pending at the time of your discharge. Please request that your primary care M.D. goes through all the records of your hospital data and follows up on these results.  Also Note the following: If you experience worsening of your admission symptoms, develop shortness of breath, life threatening emergency, suicidal or homicidal thoughts you must seek medical attention immediately  by calling 911 or calling your MD immediately  if symptoms less severe.  You must read complete instructions/literature along with all the possible adverse reactions/side effects for all the Medicines you take and that have been prescribed to you. Take any new Medicines after you have completely understood and accpet all the possible adverse reactions/side effects.  Do not drive when taking Pain medications or sleeping medications (Benzodaizepines)  Do not take more than prescribed Pain, Sleep and Anxiety Medications. It is not advisable to combine anxiety,sleep and pain medications without talking with your primary care practitioner  Special Instructions: If you have smoked or chewed Tobacco  in the last 2 yrs please stop smoking, stop any regular Alcohol  and or any Recreational drug use.  Wear Seat belts while driving.  Please note: You were cared for by a hospitalist during your hospital stay. Once you are discharged, your primary care physician will handle any further medical issues. Please note that NO REFILLS for any discharge medications will be authorized once you are discharged, as it is imperative that you return to your primary care physician (or establish a relationship with a primary care physician if you do not have one) for your post hospital discharge needs so that they can reassess your need for medications and monitor your lab values.  Total Time spent coordinating discharge including counseling, education and face to face time equals greater than 30 minutes.  SignedOren Binet 10/17/2021 10:16 AM

## 2021-10-17 NOTE — Progress Notes (Signed)
Inpatient Rehabilitation  Patient resuming Inpatient Rehabilitation Program following an interruption in stay from 10/16/2021-10/17/2021 for acute surgery. Patient information will continue to be reviewed and entered into eRehab system by [facility]. Loni Beckwith., CCC/SLP, PPS Coordinator.  Information including medical coding, functional ability, and quality indicators will be reviewed and updated through discharge.

## 2021-10-17 NOTE — Progress Notes (Signed)
PROGRESS NOTE   Subjective/Complaints: No new complaints this morning Excited to return to rehab Feels motivated that she will now be able to tolerate three hours of therapy per day   ROS: Constipation now improved, +pain in bilateral lower extremities, +loose incontinent stool improved, minimal postop pain in right leg  Objective:   DG C-Arm 1-60 Min-No Report  Result Date: 10/16/2021 Fluoroscopy was utilized by the requesting physician.  No radiographic interpretation.   DG HIP UNILAT WITH PELVIS 2-3 VIEWS LEFT  Result Date: 10/16/2021 CLINICAL DATA:  Left hip  REPAIR QUADRICEP TENDON EXAM: DG HIP (WITH OR WITHOUT PELVIS) 2-3V LEFT COMPARISON:  X-ray left hip 10/02/2021 FINDINGS: Intraoperative repair of the quadriceps tendon per history. One low resolution intraoperative spot views of the left hip were obtained in a patient status post total left hip arthroplasty. No fracture visible on the limited views. Total fluoroscopy time: 10.3 seconds Total radiation dose: 1.8 mGy IMPRESSION: Intraoperative repair of the quadriceps tendon in a patient status post total left hip arthroplasty. Electronically Signed   By: Iven Finn M.D.   On: 10/16/2021 17:12   Recent Labs    10/16/21 1335 10/17/21 0139  WBC  --  11.8*  HGB 10.9* 8.9*  HCT 32.0* 27.3*  PLT  --  279     Recent Labs    10/16/21 1335 10/17/21 0139  NA 136 134*  K 4.4 5.2*  CL 100 96*  CO2  --  23  GLUCOSE 166* 255*  BUN 31* 43*  CREATININE 5.40* 5.91*  CALCIUM  --  9.2      Intake/Output Summary (Last 24 hours) at 10/17/2021 1200 Last data filed at 10/17/2021 0343 Gross per 24 hour  Intake 300 ml  Output 25 ml  Net 275 ml         Physical Exam: Vital Signs Blood pressure (!) 158/78, pulse 90, temperature 97.9 F (36.6 C), temperature source Oral, resp. rate 18, SpO2 98 %.     General: awake, alert, appropriate, laying in bed- NAD, BMI  26.66 HENT: conjugate gaze; oropharynx moist CV: regular rate; no JVD Pulmonary: CTA B/L; no W/R/R- good air movement GI: much more soft, NT, ND, (+)BS- hypoactive still Psychiatric: appropriate Neurological: Ox3 Musculoskeletal:        General: Swelling and tenderness present.     Cervical back: Normal range of motion and neck supple.     Right lower leg: Edema present.     Left lower leg: Edema present.     Comments: Left hip tender to palpation and PROM with associated swelling. Left knee with mild effusion, patella laxity. Right hip tender with leg raise  Active range of motion by patient with good improvements. Right knee in immobilizer.  Right knee tender and swollen Able to get out of bed today Skin:    Comments: Incision site dressing clean dry and intact.  R IJ TDC with bandage Neurological:     Comments: Patient is alert.  No acute distress.  Oriented x3 and follows commands. Alert and oriented x 3. Normal insight and awareness. Intact Memory. Normal language and speech. Cranial nerve exam unremarkable. UE motor 5/5. LE motor limited  proximally by pain. ADF/PF 4-5/5. No sensory findings.   Psychiatric:     Comments: Pleasant, slightly anxious  GU: stool incontinence in diaper   Assessment/Plan: 1. Functional deficits which require 3+ hours per day of interdisciplinary therapy in a comprehensive inpatient rehab setting. Physiatrist is providing close team supervision and 24 hour management of active medical problems listed below. Physiatrist and rehab team continue to assess barriers to discharge/monitor patient progress toward functional and medical goals  Care Tool:  Bathing              Bathing assist       Upper Body Dressing/Undressing Upper body dressing        Upper body assist      Lower Body Dressing/Undressing Lower body dressing            Lower body assist       Toileting Toileting    Toileting assist       Transfers Chair/bed  transfer  Transfers assist           Locomotion Ambulation   Ambulation assist              Walk 10 feet activity   Assist           Walk 50 feet activity   Assist           Walk 150 feet activity   Assist           Walk 10 feet on uneven surface  activity   Assist           Wheelchair     Assist               Wheelchair 50 feet with 2 turns activity    Assist            Wheelchair 150 feet activity     Assist          Blood pressure (!) 158/78, pulse 90, temperature 97.9 F (36.6 C), temperature source Oral, resp. rate 18, SpO2 98 %.    Medical Problem List and Plan: 1. Functional deficits secondary to nondisplaced fracture right inferior pubic ramus as well as a displaced foreshortened fracture through the left femoral neck/intertrochanteric area.  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   -Weightbearing as tolerated with anterior total hip precautions.                                  -Conservative care right inferior pubic ramus fracture, weightbearing as tolerated             -patient may  shower             -ELOS/Goals: 10-14 days, supervision to min assist goals  Discussed with team plan for surgery on Thursday. NPO at midnight on Wednesday  Interrupted stay- returning to rehab today  -HFU scheduled 2.  Antithrombotics: -DVT/anticoagulation:  Mechanical: Antiembolism stockings, thigh (TED hose) Bilateral lower extremities check vascular study.   -should be able to begin lovenox today as permacath placed            -antiplatelet therapy: N/A 3. Femur fracture pain: continue Flector patch,Robaxin 750 mg 4 times daily, hydrocodone and oxycodone as needed             -pt having significant spasms in left thigh.             -add kpad to help  with spasms  -schedule tylenol 1000mg  TID 4. Anxiety: Instructed on deep breathing techniques. Provide emotional support.  Melatonin as needed              -antipsychotic agents: N/A 5. Neuropsych: This patient is capable of making decisions on her own behalf. 6. Skin/Wound Care: Routine skin checks 7. Fluids/Electrolytes/Nutrition: Routine in and outs with follow-up chemistries 8.  End-stage renal disease/hemodialysis.  Permacath exchanged 10/03/2021.    -  Follow-up hemodialysis per Dr. [name] -HD later in day to allow participation in therapies during the day 9.  Acute on chronic anemia.  Has had transfusions already x 2. Hgb has stabilized in 7 range. Continue iron supplement.              -epo per nephrology 10.  Diabetes mellitus with peripheral neuropathy. Placed order that she may use her Dexcom 6. Hemoglobin A1c 6.2.  SSI as prior to admission. Discuss Qutenza. Discussed with nursing to continue insulin despite NPO given her elevated CBG 11.  Hypertension. Continue cozaar 12.  Hyperparathyroidism of renal origin.  Plan outpatient parathyroidectomy per Dr.Lateef 13.  Overweight.  BMI 29.11.  Dietary follow-up. Provided list of foods that can help with weight loss.  14. Cushingoid?--outpt work up 31. Constipation: resolved. Decrease colace to daily prn. D/c senna. Discussed benefits of high fiber foods 16. Right quadriceps tear: surgery complete with good healing on XR, minimal postop pain, return to rehab today, discussed importance of participating in 3 hours therapy daily as much as she can tolerate and she is very motivated and excited for rehab 17. Gas: continue simethicone 18. Ileus: resolved, regular diet resumed -tolerating.  19. Abd spasms: resolved 20. Black tarry stools: discussed heme occult negative last night. Hgb reviewed and stable.     LOS: 1 days A FACE TO FACE EVALUATION WAS PERFORMED  Tr[facility]yland P Lesly Pontarelli 10/17/2021, 12:00 PM

## 2021-10-17 NOTE — Consult Note (Signed)
[facility] Renal Consultation Note    Indication for Consultation:  Management of ESRD/hemodialysis, anemia, hypertension/volume, and secondary hyperparathyroidism.  HPI:  [name] is a 56 y.o. female. Transiently readmitted to [facility] for quadriceps tendon repair, readmitted to CIR today. Reports her surgery went well, she is just a little sore. Denies SOB, CP, palpitations, dizziness, abdominal pain and nausea.   History: The patient is a 56 y.o. year-old w/ hx of anemia, ESRD on HD mwf, DM2, HTN, hx of CVA who had a fall at home w/ L hip pain and presented to [facility] on 09/28/21. Xrays showed L hip fracture and R inf pubic ramus fx. She had L hip hemiarthroplasty on 09/29/21 by Dr Harlow Mares. Her TDC was exchanged by Dr Ronalee Belts for malfunction. The pt was transferred to [facility] CIR for rehab stay. We are asked to see for ESRD and have been following pt throughout her rehab admission.   Past Medical History:  Diagnosis Date   Anemia    vitamin d3 deficiency   Anxiety    Chronic kidney disease    End Stage Renal Disease   Diabetes mellitus without complication (HCC)    GERD (gastroesophageal reflux disease)    nothing over last few years   High serum parathyroid hormone (PTH)    checked through Dialysis   History of kidney stones 2000   Hypertension    Neuromuscular disorder (HCC)    neuropathy in feet   PONV (postoperative nausea and vomiting)    severe nausea requiring many doses of post op antiemetics   Stroke ([place]) 10/2017   thinks she had a series of mini strokes.right leg up to right side of face were numb. no loss of consciousness   Past Surgical History:  Procedure Laterality Date   ABDOMINAL HYSTERECTOMY  2007   AV FISTULA INSERTION W/ RF MAGNETIC GUIDANCE N/A 12/08/2017   Procedure: AV FISTULA INSERTION W/RF MAGNETIC GUIDANCE;  Surgeon: Katha Cabal, MD;  Location: [place] [facility];  Service: Cardiovascular;  Laterality: N/A;   DIALYSIS/PERMA CATHETER  INSERTION Right 10/03/2021   Procedure: DIALYSIS/PERMA CATHETER INSERTION;  Surgeon: Katha Cabal, MD;  Location: [place] [facility];  Service: Cardiovascular;  Laterality: Right;   HIP ARTHROPLASTY Left 09/29/2021   Procedure: ARTHROPLASTY BIPOLAR HIP (HEMIARTHROPLASTY);  Surgeon: Lovell Sheehan, MD;  Location: [facility] ORS;  Service: Orthopedics;  Laterality: Left;   ROTATOR CUFF REPAIR Right 2004   SHOULDER CLOSED REDUCTION Right 2004   UPPER EXTREMITY VENOGRAPHY Left 02/15/2018   Procedure: UPPER EXTREMITY VENOGRAPHY;  Surgeon: Katha Cabal, MD;  Location: [place] [facility];  Service: Cardiovascular;  Laterality: Left;   Family History  Problem Relation Age of Onset   Emphysema Mother    COPD Mother    Cancer Father    Cancer Paternal Aunt    Diabetes Sister    Hypertension Sister    Eczema Sister    Diabetes Brother    Hypertension Brother    Cardiomyopathy Brother    Alcohol abuse Brother    Social History:  reports that she has never smoked. She has never used smokeless tobacco. She reports that she does not drink alcohol and does not use drugs.  ROS: As per HPI otherwise negative.  Physical Exam: Vitals:   10/17/21 0000 10/17/21 0300 10/17/21 0823 10/17/21 1215  BP: (!) 156/71 131/60 (!) 158/78 139/61  Pulse: 92 91 90 86  Resp: 16 18 18 18   Temp: 98.2 F (36.8 C) 98.1 F (  36.7 C) 97.9 F (36.6 C) 98.5 F (36.9 C)  TempSrc: Oral Oral Oral Oral  SpO2: 94% 93% 98% 98%     General: Well developed, well nourished, in no acute distress. Head: Normocephalic, atraumatic, sclera non-icteric, mucus membranes are moist. Neck: JVD not elevated. Lungs: Clear bilaterally to auscultation without wheezes, rales, or rhonchi. Breathing is unlabored. Heart: RRR with normal S1, S2. No murmurs, rubs, or gallops appreciated. Abdomen: Soft, non-tender, non-distended with normoactive bowel sounds. No rebound/guarding. No obvious abdominal masses. Musculoskeletal:  Strength  and tone appear normal for age. Lower extremities: No edema, brace and ice pack on left leg Neuro: Alert and oriented X 3. Moves all extremities spontaneously. Psych:  Responds to questions appropriately with a normal affect. Dialysis Access: R IJ TDC with dry, intact bandage  Allergies  Allergen Reactions   Enalapril Hives and Other (See Comments)    Angioedema face.   Ivp Dye [Iodinated Contrast Media] Hives   Lisinopril Shortness Of Breath   Shellfish Allergy Swelling and Other (See Comments)    patient tolerates shellfish by mouth without problem   Prior to Admission medications   Medication Sig Start Date End Date Taking? Authorizing Provider  HYDROcodone-acetaminophen (NORCO/VICODIN) 5-325 MG tablet Take 1 tablet by mouth every 6 (six) hours as needed for moderate pain. 10/17/21 10/17/22 Yes Georgeanna Harrison, MD  acetaminophen (TYLENOL) 500 MG tablet Take 2 tablets (1,000 mg total) by mouth 3 (three) times daily. 10/13/21   Angiulli, Lavon Paganini, PA-C  ascorbic acid (VITAMIN C) 500 MG tablet Take 1 tablet (500 mg total) by mouth daily. 10/13/21   Angiulli, Lavon Paganini, PA-C  Darbepoetin Alfa (ARANESP) 40 MCG/0.4ML SOSY injection Inject 0.4 mLs (40 mcg total) into the vein every Friday with hemodialysis. 10/17/21   Angiulli, Lavon Paganini, PA-C  diclofenac (FLECTOR) 1.3 % PTCH Place 1 patch onto the skin 2 (two) times daily. 10/13/21   Angiulli, Lavon Paganini, PA-C  diclofenac Sodium (VOLTAREN) 1 % GEL Apply 2 g topically 4 (four) times daily. 10/13/21   Angiulli, Lavon Paganini, PA-C  enoxaparin (LOVENOX) 30 MG/0.3ML injection Inject 0.3 mLs (30 mg total) into the skin daily. 10/13/21   Angiulli, Lavon Paganini, PA-C  ferric citrate (AURYXIA) 1 GM 210 MG(Fe) tablet Take 1 tablet (210 mg total) by mouth as needed (with snacks). 10/13/21   Angiulli, Lavon Paganini, PA-C  ferric citrate (AURYXIA) 1 GM 210 MG(Fe) tablet Take 2 tablets (420 mg total) by mouth 3 (three) times daily with meals. 10/13/21   Angiulli, Lavon Paganini, PA-C   HYDROcodone-acetaminophen (NORCO/VICODIN) 5-325 MG tablet Take 1 tablet by mouth every 4 (four) hours as needed for moderate pain. 10/13/21   Angiulli, Lavon Paganini, PA-C  insulin aspart (NOVOLOG) 100 UNIT/ML injection Inject 0-6 Units into the skin 3 (three) times daily with meals. 10/13/21   Angiulli, Lavon Paganini, PA-C  losartan (COZAAR) 50 MG tablet Take 50 mg by mouth daily.  08/26/18   Holley Raring Munsoor, MD  methocarbamol (ROBAXIN) 750 MG tablet Take 1 tablet (750 mg total) by mouth 4 (four) times daily. 10/06/21   Nita Sells, MD  multivitamin (RENA-VIT) TABS tablet Take 1 tablet by mouth at bedtime. 10/06/21   Nita Sells, MD  Nutritional Supplements (FEEDING SUPPLEMENT, NEPRO CARB STEADY,) LIQD Take 237 mLs by mouth 2 (two) times daily between meals. 10/06/21   Nita Sells, MD  oxyCODONE (OXY IR/ROXICODONE) 5 MG immediate release tablet Take 1 tablet (5 mg total) by mouth every 4 (four) hours as needed for moderate  pain. 10/13/21   Angiulli, Lavon Paganini, PA-C   Current Facility-Administered Medications  Medication Dose Route Frequency Provider Last Rate Last Admin   [START ON 10/18/2021] acetaminophen (TYLENOL) tablet 325-650 mg  325-650 mg Oral Q6H PRN Georgeanna Harrison, MD       acetaminophen (TYLENOL) tablet 500 mg  500 mg Oral Q6H Georgeanna Harrison, MD   500 mg at 10/17/21 1222   albuterol (PROVENTIL) (2.5 MG/3ML) 0.083% nebulizer solution 2.5 mg  2.5 mg Nebulization Q2H PRN Jonetta Osgood, MD       ascorbic acid (VITAMIN C) tablet 500 mg  500 mg Oral Daily Jonetta Osgood, MD   500 mg at 10/17/21 6160   Chlorhexidine Gluconate Cloth 2 % PADS 6 each  6 each Topical Daily Jonetta Osgood, MD   6 each at 10/17/21 7371   docusate sodium (COLACE) capsule 100 mg  100 mg Oral BID Georgeanna Harrison, MD   100 mg at 10/17/21 0820   [START ON 10/18/2021] enoxaparin (LOVENOX) injection 30 mg  30 mg Subcutaneous Q24H Georgeanna Harrison, MD       feeding supplement (NEPRO CARB STEADY) liquid 237  mL  237 mL Oral BID BM Jonetta Osgood, MD   237 mL at 10/17/21 0626   HYDROcodone-acetaminophen (NORCO) 7.5-325 MG per tablet 1-2 tablet  1-2 tablet Oral Q4H PRN Georgeanna Harrison, MD       HYDROcodone-acetaminophen (NORCO/VICODIN) 5-325 MG per tablet 1-2 tablet  1-2 tablet Oral Q4H PRN Georgeanna Harrison, MD       insulin aspart (novoLOG) injection 0-9 Units  0-9 Units Subcutaneous TID WC Jonetta Osgood, MD   2 Units at 10/17/21 1226   losartan (COZAAR) tablet 50 mg  50 mg Oral Daily Jonetta Osgood, MD   50 mg at 10/17/21 9485   methocarbamol (ROBAXIN) tablet 750 mg  750 mg Oral Q8H PRN Jonetta Osgood, MD   750 mg at 10/17/21 1226   morphine (PF) 2 MG/ML injection 0.5-1 mg  0.5-1 mg Intravenous Q2H PRN Georgeanna Harrison, MD       multivitamin (RENA-VIT) tablet 1 tablet  1 tablet Oral QHS Jonetta Osgood, MD   1 tablet at 10/16/21 2304   ondansetron (ZOFRAN) tablet 4 mg  4 mg Oral Q6H PRN Georgeanna Harrison, MD       Or   ondansetron [facility]) injection 4 mg  4 mg Intravenous Q6H PRN Georgeanna Harrison, MD       polyethylene glycol (MIRALAX / GLYCOLAX) packet 17 g  17 g Oral Daily Jonetta Osgood, MD   17 g at 10/17/21 0820   Labs: Basic Metabolic Panel: Recent Labs  Lab 10/12/21 0649 10/13/21 0811 10/16/21 1335 10/17/21 0139  NA 137 134* 136 134*  K 4.4 5.9* 4.4 5.2*  CL 97* 96* 100 96*  CO2 25 23  --  23  GLUCOSE 166* 199* 166* 255*  BUN 36* 50* 31* 43*  CREATININE 6.08* 7.50* 5.40* 5.91*  CALCIUM 9.3 9.2  --  9.2  PHOS 4.3 4.3  --  4.4   Liver Function Tests: Recent Labs  Lab 10/12/21 0649 10/13/21 0811 10/17/21 0139  ALBUMIN 2.8* 3.0* 3.3*   No results for input(s): LIPASE, AMYLASE in the last 168 hours. No results for input(s): AMMONIA in the last 168 hours. CBC: Recent Labs  Lab 10/10/21 1459 10/12/21 0649 10/13/21 0811 10/16/21 1335 10/17/21 0139  WBC 11.5* 9.2 8.3  --  11.8*  NEUTROABS  --  7.4  --   --   --  HGB 8.1* 8.3* 8.2* 10.9* 8.9*  HCT 24.4*  26.3* 26.4* 32.0* 27.3*  MCV 97.6 98.9 100.8*  --  98.2  PLT 266 274 326  --  279   Cardiac Enzymes: No results for input(s): CKTOTAL, CKMB, CKMBINDEX, TROPONINI in the last 168 hours. CBG: Recent Labs  Lab 10/16/21 1654 10/16/21 1917 10/16/21 1946 10/17/21 0842 10/17/21 1214  GLUCAP 172* 321* 282* 170* 165*   Iron Studies: No results for input(s): IRON, TIBC, TRANSFERRIN, FERRITIN in the last 72 hours. Studies/Results: DG C-Arm 1-60 Min-No Report  Result Date: 10/16/2021 Fluoroscopy was utilized by the requesting physician.  No radiographic interpretation.   DG HIP UNILAT WITH PELVIS 2-3 VIEWS LEFT  Result Date: 10/16/2021 CLINICAL DATA:  Left hip  REPAIR QUADRICEP TENDON EXAM: DG HIP (WITH OR WITHOUT PELVIS) 2-3V LEFT COMPARISON:  X-ray left hip 10/02/2021 FINDINGS: Intraoperative repair of the quadriceps tendon per history. One low resolution intraoperative spot views of the left hip were obtained in a patient status post total left hip arthroplasty. No fracture visible on the limited views. Total fluoroscopy time: 10.3 seconds Total radiation dose: 1.8 mGy IMPRESSION: Intraoperative repair of the quadriceps tendon in a patient status post total left hip arthroplasty. Electronically Signed   By: Iven Finn M.D.   On: 10/16/2021 17:12    Dialysis Orders: MWF [place] [phone])  3h 31min  81kg   Hep 3000 then 800u/hr  RIJ TDC  Assessment/Plan: Debility - rehab per CIR Tendon rupture - RLE. Seen by orthopedics, s/p surgery yesterday, now going back to OR L hip fracture/ R pelvic ramus fx - sp L hemiarthroplasty on 09/29/21 at [facility] ESRD - on HD MWF. Continue on schedule. Next HD today.  BP/ volume - BP elevated. Continue home meds.  Under dry wt, UF as tolerated.  DM2 - per pmd Anemia ckd - Last Hgb 8.9-improving. S/p transfusion. ESA started,  Aranesp 40 on 2/10.  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  Continue binders.  9. Nutrition - currently on regular diet  w/fluid restrictions.  Follow labs if K starts trending up more, change to renal diet.  Elevated pre HD on Monday, if elevated again change back to renal diet.   Anice Paganini, PA-C 10/17/2021, 12:28 PM  [facility] Pager: [phone]

## 2021-10-17 NOTE — Evaluation (Signed)
Occupational Therapy Assessment and Plan  Patient Details  Name: [name] MRN: [id number] Date of Birth: [birth year]  OT Diagnosis: abnormal posture, acute pain, muscle weakness (generalized), pain in joint, and swelling of limb Rehab Potential: Rehab Potential (ACUTE ONLY): Good ELOS: 3 weeks   Today's Date: 10/18/2021 OT Individual Time: 0905-1003 and 1301-1330 OT Individual Time Calculation (min): 58 min and 29 min    Hospital Problem: Principal Problem:   Intertrochanteric fracture of left hip ([place])   Past Medical History:  Past Medical History:  Diagnosis Date   Anemia    vitamin d3 deficiency   Anxiety    Chronic kidney disease    End Stage Renal Disease   Diabetes mellitus without complication (HCC)    GERD (gastroesophageal reflux disease)    nothing over last few years   High serum parathyroid hormone (PTH)    checked through Dialysis   History of kidney stones 2000   Hypertension    Neuromuscular disorder (HCC)    neuropathy in feet   PONV (postoperative nausea and vomiting)    severe nausea requiring many doses of post op antiemetics   Stroke ([place]) 10/2017   thinks she had a series of mini strokes.right leg up to right side of face were numb. no loss of consciousness   Past Surgical History:  Past Surgical History:  Procedure Laterality Date   ABDOMINAL HYSTERECTOMY  2007   AV FISTULA INSERTION W/ RF MAGNETIC GUIDANCE N/A 12/08/2017   Procedure: AV FISTULA INSERTION W/RF MAGNETIC GUIDANCE;  Surgeon: Katha Cabal, MD;  Location: [place] [facility];  Service: Cardiovascular;  Laterality: N/A;   DIALYSIS/PERMA CATHETER INSERTION Right 10/03/2021   Procedure: DIALYSIS/PERMA CATHETER INSERTION;  Surgeon: Katha Cabal, MD;  Location: [place] [facility];  Service: Cardiovascular;  Laterality: Right;   HIP ARTHROPLASTY Left 09/29/2021   Procedure: ARTHROPLASTY BIPOLAR HIP (HEMIARTHROPLASTY);  Surgeon: Lovell Sheehan, MD;  Location: [facility];   Service: Orthopedics;  Laterality: Left;   QUADRICEPS TENDON REPAIR Right 10/16/2021   Procedure: REPAIR QUADRICEP TENDON;  Surgeon: Georgeanna Harrison, MD;  Location: [place];  Service: Orthopedics;  Laterality: Right;   ROTATOR CUFF REPAIR Right 2004   SHOULDER CLOSED REDUCTION Right 2004   UPPER EXTREMITY VENOGRAPHY Left 02/15/2018   Procedure: UPPER EXTREMITY VENOGRAPHY;  Surgeon: Katha Cabal, MD;  Location: [place] [facility];  Service: Cardiovascular;  Laterality: Left;    Assessment & Plan Clinical Impression: [name] is a 56 year old right-handed female with history of end-stage renal disease with hemodialysis via right IJ, diabetes mellitus with neuropathy, acute on chronic anemia hyperlipidemia obesity with BMI 27.76, secondary hyperparathyroid of renal origin and currently awaiting outpatient scheduling for evaluation of parathyroidectomy.  Patient lives with spouse modified independent prior to admission.  Presented to [facility] 09/28/2021 with complaints of severe left hip pain.  Patient states her leg gave out she heard a pop on the left hip.  She also had complaints of right side hip pain from a fall.  X-rays and imaging revealed displaced foreshortened fracture of the neck and intertrochanteric left femur as well as acute nondisplaced fracture of the right inferior pubic ramus.  Patient underwent left hip anterior hip hemiarthroplasty 09/29/2021 per Dr. Jorge Mandril.  Conservative care of right inferior pubic ramus fracture.  Patient was advised weightbearing as tolerated left lower extremity as well as weightbearing as tolerated right lower extremity.  Hospital course ongoing vascular surgery follow-up consulted for permacath placement and exchange from right IJ  to better dialysis 10/03/2021.  Acute on chronic anemia monitored for any bleeding episodes.  Therapy evaluations completed patient was admitted inpatient rehab services 10/06/2021.  Patient had been cleared to begin Lovenox for DVT  prophylaxis after permacath has been placed.  Orthopedic services consulted 10/09/2021 for right knee pain her MRI showed nearly complete quad tendon tear and patient was discharged to acute care services  underwent right quadricep repair 10/16/2021 per Dr. Mable Fill.  Patient is touchdown weightbearing right lower extremity with knee immobilizer locked in extension.  Weightbearing as tolerated left lower extremity with anterior hip precautions.  Hemodialysis ongoing as per renal services.  Patient is readmitted to inpatient rehab services to continue comprehensive therapies.  Patient currently requires mod with basic self-care skills secondary to muscle weakness, pain, and decreased balance strategies and difficulty maintaining precautions.  Prior to hospitalization, patient could complete BADLs with modified independent .  Patient will benefit from skilled intervention to increase independence with basic self-care skills prior to discharge  home with intermittent supervision from husband .  Anticipate patient will require intermittent supervision and follow up home health.  OT - End of Session Endurance Deficit: No OT Assessment Rehab Potential (ACUTE ONLY): Good OT Barriers to Discharge: Decreased caregiver support OT Patient demonstrates impairments in the following area(s): Balance;Edema;Motor;Pain;Skin Integrity OT Basic ADL's Functional Problem(s): Grooming;Bathing;Dressing;Toileting OT Advanced ADL's Functional Problem(s): Simple Meal Preparation OT Transfers Functional Problem(s): Toilet;Tub/Shower OT Additional Impairment(s): None OT Plan OT Intensity: Minimum of 1-2 x/day, 45 to 90 minutes OT Frequency: 5 out of 7 days OT Duration/Estimated Length of Stay: 3 weeks OT Treatment/Interventions: Balance/vestibular training;DME/adaptive equipment instruction;Patient/family education;Therapeutic Activities;Psychosocial support;Therapeutic Exercise;Functional mobility training;Self Care/advanced  ADL retraining;UE/LE Strength taining/ROM;Discharge planning;Pain management;UE/LE Coordination activities OT Self Feeding Anticipated Outcome(s): no goal OT Basic Self-Care Anticipated Outcome(s): Supervision OT Toileting Anticipated Outcome(s): Supervision OT Bathroom Transfers Anticipated Outcome(s): CGA OT Recommendation Recommendations for Other Services: Therapeutic Recreation consult Therapeutic Recreation Interventions: Stress management;Kitchen group Patient destination: Home Follow Up Recommendations: Home health OT Equipment Recommended: To be determined   OT Evaluation Precautions/Restrictions  Precautions Precautions: Fall;Anterior Hip Precaution Comments: HD access port RUQ Required Braces or Orthoses: Knee Immobilizer - Right Knee Immobilizer - Right: On at all times Restrictions Weight Bearing Restrictions: Yes RLE Weight Bearing: Weight bearing as tolerated LLE Weight Bearing: Weight bearing as tolerated Other Position/Activity Restrictions: anterior hip- no hip extension, hip external rotation, or abduction General   Vital Signs   Pain Pain Assessment Pain Scale: 0-10 Pain Score: 10-Worst pain ever Pain Type: Acute pain Pain Location: Knee Pain Orientation: Right Pain Descriptors / Indicators: Other (Comment) (states it feels "very surgical") Pain Onset: On-going Patients Stated Pain Goal: 2 Pain Intervention(s): Medication (See eMAR);Relaxation;Rest;Emotional support;Distraction Home Living/Prior Functioning Home Living Available Help at Discharge: Family, Available PRN/intermittently Type of Home: House Home Access: Stairs to enter Technical brewer of Steps: 5 Entrance Stairs-Rails: Right, Left Home Layout: One level Bathroom Shower/Tub: Multimedia programmer: Standard Bathroom Accessibility: Yes Additional Comments: using rollator for past 3-4 months and using crutches for stairs, drove herself to HD  Lives With: Spouse IADL  History Homemaking Responsibilities: Yes 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, dispensing Prior Function Level of Independence: Independent with basic ADLs, Independent with homemaking with ambulation, Requires assistive device for independence  Able to Take Stairs?: Yes Driving: Yes Vocation: Retired Surveyor, mining Baseline Vision/History: 1 Wears glasses Ability to See in Adequate Light: 0 Adequate Patient Visual Report: No change from baseline Vision Assessment?:  No apparent visual deficits Perception  Perception: Within Functional Limits Praxis Praxis: Intact Cognition Overall Cognitive Status: Within Functional Limits for tasks assessed Arousal/Alertness: Awake/alert Orientation Level: Person;Situation;Place Person: Oriented Place: Oriented Situation: Oriented Year: 2023 Month: February Day of Week: Correct Memory: Appears intact Immediate Memory Recall: Sock;Bed;Blue Memory Recall Sock: With Cue Memory Recall Blue: Without Cue Memory Recall Bed: Without Cue Attention: Focused;Sustained;Selective Focused Attention: Appears intact Sustained Attention: Appears intact Selective Attention: Appears intact Awareness: Appears intact Safety/Judgment: Appears intact Sensation Sensation Light Touch: Appears Intact Hot/Cold: Not tested Stereognosis: Not tested Coordination Gross Motor Movements are Fluid and Coordinated: No Fine Motor Movements are Fluid and Coordinated: Yes Coordination and Movement Description: generalized weakness/deconditioning, WBing restrictions in B LEs impacting movement patterns Finger Nose Finger Test: [facility] bilaterally Motor  Motor Motor: Other (comment) Motor - Skilled Clinical Observations: generalized weakness/deconditioning, WBing restrictions in LEs  Trunk/Postural Assessment  Cervical Assessment Cervical Assessment: Exceptions to [facility] (forward head) Thoracic Assessment Thoracic Assessment:  Exceptions to [facility] (rounded shoulders) Lumbar Assessment Lumbar Assessment: Exceptions to [facility] (posterior pelvic tilt) Postural Control Postural Control: Within Functional Limits  Balance Balance Balance Assessed: Yes Static Sitting Balance Static Sitting - Balance Support: Bilateral upper extremity supported;Feet supported Static Sitting - Level of Assistance: 6: Modified independent (Device/Increase time) Dynamic Sitting Balance Dynamic Sitting - Balance Support: During functional activity Dynamic Sitting - Level of Assistance: Other (comment);4: Min assist (slideboard transfer to recliner) Static Standing Balance Static Standing - Balance Support:  (not assessed) Extremity/Trunk Assessment RUE Assessment RUE Assessment: Within Functional Limits Active Range of Motion (AROM) Comments: WNL LUE Assessment LUE Assessment: Within Functional Limits Active Range of Motion (AROM) Comments: WNL  Care Tool Care Tool Self Care Eating    Not assessed    Oral Care    Oral Care Assist Level: Set up assist    Bathing   Body parts bathed by patient: Right arm;Left arm;Chest;Abdomen;Right upper leg;Left upper leg;Face;Front perineal area;Buttocks Body parts bathed by helper: Left lower leg Body parts n/a: Right lower leg Assist Level: Minimal Assistance - Patient > 75%    Upper Body Dressing(including orthotics)   What is the patient wearing?: Dress   Assist Level: Set up assist    Lower Body Dressing (excluding footwear)   What is the patient wearing?: Incontinence brief Assist for lower body dressing: Moderate Assistance - Patient 50 - 74%    Putting on/Taking off footwear   What is the patient wearing?: Non-skid slipper socks Assist for footwear: Total Assistance - Patient < 25%       Care Tool Toileting Toileting activity Toileting Activity did not occur (Clothing management and hygiene only): N/A (no void or bm)           Toilet transfer Toilet transfer activity did  not occur: N/A (not assessed due to time constraints)        Memory/Recall Ability Memory/Recall Ability : Current season;Location of own room;Staff names and faces;That he or she is in a hospital/hospital unit   Refer to Care Plan for Long Term Goals  SHORT TERM GOAL WEEK 1 OT Short Term Goal 1 (Week 1): Pt will complete LB dressing with Mod A using AE as needed OT Short Term Goal 2 (Week 1): Pt will complete BSC transfer with 1 assist using LRAD to promote OOB toileting OT Short Term Goal 3 (Week 1): Pt will complete 1/3 components of toileting with supervision  Recommendations for other services: Adult nurse group and Stress management  Skilled Therapeutic Intervention Skilled OT session completed with focus on initial evaluation, education on OT role/POC, and establishment of patient-centered goals.   Pt greeted in bed, eager for therapy and ready to go. BADLs completed EOB, utilizing lateral leans as needed. Pt wishing to defer standing, though we did review her WB and hip precautions. Setup for UB self care. Max A for LB self care without AE. We propped her Rt foot up on pillows to maintain knee extension. Pt in pain however manageable and premedicated, pt stating "it's ok- no pain, no gain." CGA of 2 for slideboard<recliner. Pt remained sitting up, left with all needs within reach and bed alarm set, ice packs provided for hip and knee.   2nd Session 1:1 tx (29 min) Pt greeted in the recliner with pain manageable for tx without additional interventions. OT brought in reacher, leg lifter, and sock aide and initiated AE training. Pt able to demonstrate carryover of education when doffing/donning footwear given supervision. Discussed using leg lifter to help with bed mobility, emphasizing small movements for safety with the Lt hip. OT also brought pt in a padded BSC, tried to find extra wide drop arm BSC but unsuccessful. Pt at present is incontinent but wants to make  timed toileting a goal before d/c home. Pt remained sitting up, all needs within reach.     ADL ADL Grooming: Setup Where Assessed-Grooming: Edge of bed Upper Body Bathing: Setup Where Assessed-Upper Body Bathing: Edge of bed Lower Body Bathing: Minimal assistance Where Assessed-Lower Body Bathing: Edge of bed Upper Body Dressing: Setup Where Assessed-Upper Body Dressing: Edge of bed Lower Body Dressing: Maximal assistance Where Assessed-Lower Body Dressing: Edge of bed Toileting: Not assessed Toilet Transfer: Not assessed Tub/Shower Transfer: Not assessed Mobility   CGA-Min A of 2 slideboard transfer Sit<stand: not attempted   Discharge Criteria: Patient will be discharged from OT if patient refuses treatment 3 consecutive times without medical reason, if treatment goals not met, if there is a change in medical status, if patient makes no progress towards goals or if patient is discharged from hospital.  The above assessment, treatment plan, treatment alternatives and goals were discussed and mutually agreed upon: by patient  Skeet Simmer 10/18/2021, 12:47 PM

## 2021-10-17 NOTE — Progress Notes (Signed)
Meredith Staggers, MD  Physician Physical Medicine and Rehabilitation PMR Pre-admission    Signed Date of Service:  10/03/2021  3:00 PM  Related encounter: ED to Hosp-Admission (Discharged) from 09/28/2021 in [place] (1A)   Signed      Show:Clear all [x] Written[x] Templated[x] Copied  Added by: [x] Cristina Gong, RN[x] Meredith Staggers, MD  [] Hover for details                                                                                                                                                                                                                                                                                                                                                                                                                                                                PMR Admission Coordinator Pre-Admission Assessment   Patient: [name] is an 56 y.o., female MRN: [id number] DOB: [birth year] Height: 5\' 7"  (170.2 cm) Weight: 79.2 kg Insurance Information HMO:     PPO:      PCP:      IPA:      80/20:      OTHER:  PRIMARY: Medicare a and b      Policy#: [id number]      Subscriber: pt Benefits:  Phone #: passport one source online  Name: 10/01/2021 Eff. Date: 07/31/2018     Deduct: $1600      Out of Pocket Max: none      Life Max: none CIR: 100%      SNF: 20 full days Outpatient: 80%     Co-Pay: 20% Home Health: 100%      Co-Pay: none DME: 80%     Co-Pay: 20% Providers: pt choice  SECONDARY: [place] [employer]      Policy#: [id number] verified to be secondary by pre service center as well as call to [employer] at [phone] per rep on 2/1 reference number [id number]   Financial Counselor:       Phone#:    The Data Collection Information Summary for patients in  Inpatient Rehabilitation Facilities with attached Privacy Act [place] Records was provided and verbally reviewed with: Patient and Family   Emergency Contact Information Contact Information       Name Relation Home Work Mobile    [place] Spouse [phone]             Current Medical History  Patient Admitting Diagnosis: Hip Fracture   History of Present Illness:  56 year old right-handed female with history of end-stage renal disease with hemodialysis via right IJ, diabetes mellitus with neuropathy, acute on chronic anemia, hyperlipidemia, obesity with BMI 27.76, secondary hyperparathyroidism of renal origin and currently awaiting outpatient scheduling for evaluation of parathyroidectomy, hypertension.   Modified independent with Rollator for household and community ambulation and bilateral axillary crutches for stair negotiation.  Presented to [facility] 09/28/2021 with complaints of severe left hip pain.  Patient states her leg gave out and she heard a pop on the left hip.  She also had complaints of right side hip pain from a fall.  X-rays and imaging revealed displaced foreshortened fracture of the neck and intertrochanteric left femur as well as acute nondisplaced fracture of the right inferior pubic ramus.  Patient underwent left hip anterior hip hemiarthroplasty 09/29/2021 per Dr. Kurtis Bushman.  Conservative care of right inferior pubic ramus fracture.  Patient is currently weightbearing as tolerated left lower extremity as well as weightbearing as tolerated right lower extremity.  Hospital course hemodialysis ongoing vascular surgery Dr Rica Koyanagi consulted to evaluate for exchange PermCath from right IJ to better dialysis 10/03/2021.  Hospital course acute on chronic anemia latest hemoglobin 6.7 of which she was transfused and follow-up hemoglobin 7.7 and patient also remains on iron supplement.     Patient's medical record from [facility] has been reviewed by the rehabilitation  admission coordinator and physician.   Past Medical History      Past Medical History:  Diagnosis Date   Anemia      vitamin d3 deficiency   Anxiety     Chronic kidney disease      End Stage Renal Disease   Diabetes mellitus without complication (HCC)     GERD (gastroesophageal reflux disease)      nothing over last few years   High serum parathyroid hormone (PTH)      checked through Dialysis   History of kidney stones 2000   Hypertension     Neuromuscular disorder ([place])      neuropathy in feet   PONV (postoperative nausea and vomiting)      severe nausea requiring many doses of post op antiemetics   Stroke ([place]) 10/2017    thinks she had a series of mini strokes.right leg up to right side of face were numb. no loss of consciousness  Has the patient had major surgery during 100 days prior to admission? Yes   Family History   family history includes Alcohol abuse in her brother; COPD in her mother; Cancer in her father and paternal aunt; Cardiomyopathy in her brother; Diabetes in her brother and sister; Eczema in her sister; Emphysema in her mother; Hypertension in her brother and sister.   Current Medications   Current Facility-Administered Medications:    0.9 %  sodium chloride infusion (Manually program via Guardrails IV Fluids), , Intravenous, Once, Nita Sells, MD   acetaminophen (TYLENOL) tablet 650 mg, 650 mg, Oral, Once, Foust, Katy L, NP   alum & mag hydroxide-simeth (MAALOX/MYLANTA) 200-200-20 MG/5ML suspension 30 mL, 30 mL, Oral, Q4H PRN, Lovell Sheehan, MD   bisacodyl (DULCOLAX) suppository 10 mg, 10 mg, Rectal, Daily PRN, Lovell Sheehan, MD   Chlorhexidine Gluconate Cloth 2 % PADS 6 each, 6 each, Topical, Daily, Sreenath, Sudheer B, MD, 6 each at 10/05/21 1737   dextrose 50 % solution 50 mL, 1 ampule, Intravenous, Once, Lovell Sheehan, MD   dextrose 50 % solution 50 mL, 1 ampule, Intravenous, Once, Lovell Sheehan, MD   diclofenac (FLECTOR) 1.3 % 1  patch, 1 patch, Transdermal, BID, Nita Sells, MD, 1 patch at 10/05/21 1007   diphenhydrAMINE (BENADRYL) capsule 25 mg, 25 mg, Oral, Once, Samtani, Jai-Gurmukh, MD   docusate sodium (COLACE) capsule 100 mg, 100 mg, Oral, BID, Lovell Sheehan, MD, 100 mg at 10/05/21 2152   feeding supplement (NEPRO CARB STEADY) liquid 237 mL, 237 mL, Oral, BID BM, Sreenath, Sudheer B, MD, 237 mL at 10/04/21 0932   ferric citrate (AURYXIA) tablet 420 mg, 420 mg, Oral, TID WC, 420 mg at 10/05/21 1733 **AND** ferric citrate (AURYXIA) tablet 210 mg, 210 mg, Oral, With snacks, Lovell Sheehan, MD, 210 mg at 10/05/21 2035   furosemide (LASIX) injection 20 mg, 20 mg, Intravenous, Once, Samtani, Jai-Gurmukh, MD   hydrALAZINE (APRESOLINE) injection 10 mg, 10 mg, Intravenous, Q4H PRN, Lovell Sheehan, MD   HYDROcodone-acetaminophen (NORCO/VICODIN) 5-325 MG per tablet 1 tablet, 1 tablet, Oral, Q4H PRN, Lovell Sheehan, MD, 1 tablet at 10/05/21 2152   insulin aspart (novoLOG) injection 0-5 Units, 0-5 Units, Subcutaneous, QHS, Lovell Sheehan, MD, 3 Units at 10/03/21 2321   insulin aspart (novoLOG) injection 0-6 Units, 0-6 Units, Subcutaneous, TID WC, Lovell Sheehan, MD, 2 Units at 10/05/21 1240   insulin aspart (novoLOG) injection 10 Units, 10 Units, Intravenous, Once, Lovell Sheehan, MD   labetalol (NORMODYNE) injection 5 mg, 5 mg, Intravenous, Q2H PRN, Lovell Sheehan, MD, 5 mg at 09/28/21 2019   losartan (COZAAR) tablet 50 mg, 50 mg, Oral, Daily, Lovell Sheehan, MD, 50 mg at 10/05/21 1002   magnesium hydroxide (MILK OF MAGNESIA) suspension 30 mL, 30 mL, Oral, Daily PRN, Lovell Sheehan, MD   magnesium oxide (MAG-OX) tablet 200 mg, 200 mg, Oral, BID, Lovell Sheehan, MD, 200 mg at 10/05/21 2151   melatonin tablet 5 mg, 5 mg, Oral, QHS PRN, Lovell Sheehan, MD   menthol-cetylpyridinium (CEPACOL) lozenge 3 mg, 1 lozenge, Oral, PRN **OR** phenol (CHLORASEPTIC) mouth spray 1 spray, 1 spray, Mouth/Throat, PRN, Lovell Sheehan, MD   methocarbamol (ROBAXIN) tablet 750 mg, 750 mg, Oral, QID, Sreenath, Sudheer B, MD, 750 mg at 10/05/21 2150   multivitamin (RENA-VIT) tablet 1 tablet, 1 tablet, Oral, QHS, Sreenath, Sudheer B, MD, 1 tablet at 10/05/21 2210   ondansetron (ZOFRAN) tablet 4  mg, 4 mg, Oral, Q6H PRN, 4 mg at 10/03/21 1513 **OR** ondansetron (ZOFRAN) injection 4 mg, 4 mg, Intravenous, Q6H PRN, Lovell Sheehan, MD   Vitamin D (Ergocalciferol) (DRISDOL) capsule 50,000 Units, 50,000 Units, Oral, Q7 days, Nita Sells, MD, 50,000 Units at 10/02/21 1607   Patients Current Diet:  Diet Order                  Diet - low sodium heart healthy             Diet renal/carb modified with fluid restriction Diet-HS Snack? Nothing; Fluid restriction: 1200 mL Fluid; Room service appropriate? Yes; Fluid consistency: Thin  Diet effective now                       Precautions / Restrictions Precautions Precautions: Fall Precaution Comments: L AVF, R IJ perm-cath ( will need new dialysis cath) Restrictions Weight Bearing Restrictions: Yes RLE Weight Bearing: Weight bearing as tolerated LLE Weight Bearing: Weight bearing as tolerated    Has the patient had 2 or more falls or a fall with injury in the past year? Yes   Prior Activity Level Community (5-7x/wk): decline in funcion; using RW and crutches as needed   Prior Functional Level Self Care: Did the patient need help bathing, dressing, using the toilet or eating? Independent   Indoor Mobility: Did the patient need assistance with walking from room to room (with or without device)? Independent   Stairs: Did the patient need assistance with internal or external stairs (with or without device)? Independent   Functional Cognition: Did the patient need help planning regular tasks such as shopping or remembering to take medications? Independent   Patient Information Are you of Hispanic, Latino/a,or Spanish origin?: A. No, not of Hispanic, Latino/a,  or Spanish origin What is your race?: A. White Do you need or want an interpreter to communicate with a doctor or health care staff?: 0. No   Patient's Response To:  Health Literacy and Transportation Is the patient able to respond to health literacy and transportation needs?: Yes Health Literacy - How often do you need to have someone help you when you read instructions, pamphlets, or other written material from your doctor or pharmacy?: Never In the past 12 months, has lack of transportation kept you from medical appointments or from getting medications?: No In the past 12 months, has lack of transportation kept you from meetings, work, or from getting things needed for daily living?: No   Home Assistive Devices / [place] Devices/Equipment: Environmental consultant (specify type), Crutches, Shower chair with back Home Equipment: Crutches, Rollator (4 wheels)   Prior Device Use: Indicate devices/aids used by the patient prior to current illness, exacerbation or injury? Walker   Current Functional Level Cognition   Overall Cognitive Status: Within Functional Limits for tasks assessed Orientation Level: Oriented X4 General Comments: Pt is less anxious and more motivated today.    Extremity Assessment (includes Sensation/Coordination)   Upper Extremity Assessment: Overall WFL for tasks assessed  Lower Extremity Assessment: Generalized weakness     ADLs   Overall ADL's : Needs assistance/impaired General ADL Comments: MIN A x2 for ADL t/f     Mobility   Overal bed mobility: Needs Assistance Bed Mobility: Supine to Sit Supine to sit: Min guard Sit to supine: Mod assist, +2 for physical assistance General bed mobility comments: increased time but with leg lifter/gait belt she is able to manage on her own  Transfers   Overall transfer level: Needs assistance Equipment used: Rolling walker (2 wheels) Transfers: Sit to/from Stand Sit to Stand: Mod assist, +2 physical  assistance Bed to/from chair/wheelchair/BSC transfer type:: Step pivot Step pivot transfers: Mod assist, Max assist, +2 physical assistance  Lateral/Scoot Transfers: Mod assist Transfer via Lift Equipment:  (hoyer) General transfer comment: able to transfert to/from commode with heavy assist and cues with physical assist to move LLE     Ambulation / Gait / Stairs / Wheelchair Mobility   Ambulation/Gait Ambulation/Gait assistance: Mod assist, Max assist Gait Distance (Feet): 3 Feet Assistive device: Rolling walker (2 wheels) Gait Pattern/deviations: Step-to pattern General Gait Details: unsafe to progress further than transfers Gait velocity: decreased     Posture / Balance Balance Overall balance assessment: Needs assistance Sitting-balance support: No upper extremity supported, Feet supported Sitting balance-Leahy Scale: Good Standing balance support: Bilateral upper extremity supported Standing balance-Leahy Scale: Poor Standing balance comment: CGA - MIN A once upright     Special needs/care consideration Was training for home hemodialysis    Previous Home Environment  Living Arrangements: Spouse/significant other Available Help at Discharge: Family, Available PRN/intermittently Type of Home: House Home Layout: One level Home Access: Stairs to enter Entrance Stairs-Rails: Left, Right Entrance Stairs-Number of Steps: 5 Bathroom Shower/Tub: Government social research officer Accessibility: Yes How Accessible: Accessible via walker Home Care Services:  (training for home hemodialysis)   Discharge Living Setting Plans for Discharge Living Setting: Patient's home, Lives with (comment) (spouse) Type of Home at Discharge: House Discharge Home Layout: One level Discharge Home Access: Stairs to enter Entrance Stairs-Rails: Right, Left Entrance Stairs-Number of Steps: 5 Discharge Bathroom Shower/Tub: Tub/shower unit Discharge Bathroom Toilet:  Standard Discharge Bathroom Accessibility: Yes How Accessible: Accessible via walker Does the patient have any problems obtaining your medications?: No   Social/Family/Support Systems Patient Roles: Spouse Contact Information: spouse, Aaron Edelman Anticipated Caregiver: Spouse Anticipated Caregiver's Contact Information: see contacts Ability/Limitations of Caregiver: none Caregiver Availability: 24/7 Discharge Plan Discussed with Primary Caregiver: Yes Is Caregiver In Agreement with Plan?: Yes Does Caregiver/Family have Issues with Lodging/Transportation while Pt is in Rehab?: No   Goals Patient/Family Goal for Rehab: supervision to min assist with PT and OT Expected length of stay: ELOS 10 to 14 days Pt/Family Agrees to Admission and willing to participate: Yes Program Orientation Provided & Reviewed with Pt/Caregiver Including Roles  & Responsibilities: Yes   Decrease burden of Care through IP rehab admission: n/a   Possible need for SNF placement upon discharge: not anticipated   Patient Condition: I have reviewed medical records from [facility], spoken with CM, and patient and spouse. I discussed via phone for inpatient rehabilitation assessment.  Patient will benefit from ongoing PT and OT, can actively participate in 3 hours of therapy a day 5 days of the week, and can make measurable gains during the admission.  Patient will also benefit from the coordinated team approach during an Inpatient Acute Rehabilitation admission.  The patient will receive intensive therapy as well as Rehabilitation physician, nursing, social worker, and care management interventions.  Due to bladder management, bowel management, safety, skin/wound care, disease management, medication administration, pain management, and patient education the patient requires 24 hour a day rehabilitation nursing.  The patient is currently Mod assist overall with mobility  and basic ADLs.  Discharge setting and therapy post discharge at  home with home health is anticipated.  Patient has agreed to participate in the Acute Inpatient Rehabilitation Program and will admit today.  Preadmission Screen Completed By:  Cleatrice Burke, 10/06/2021 10:04 AM ______________________________________________________________________   Discussed status with Dr. Naaman Plummer on 10/06/2021 at 1004 and received approval for admission today.   Admission Coordinator:  Cleatrice Burke, RN, time 1004 Date 10/06/2021    Assessment/Plan: Diagnosis: left FNF/IT fx/ right IPR fx after fall Does the need for close, 24 hr/day Medical supervision in concert with the patient's rehab needs make it unreasonable for this patient to be served in a less intensive setting? Yes Co-Morbidities requiring supervision/potential complications: ESRD on HD, dm with neuropathy, anemia Due to bladder management, bowel management, safety, skin/wound care, disease management, medication administration, pain management, and patient education, does the patient require 24 hr/day rehab nursing? Yes Does the patient require coordinated care of a physician, rehab nurse, PT, OT,  to address physical and functional deficits in the context of the above medical diagnosis(es)? Yes Addressing deficits in the following areas: balance, endurance, locomotion, strength, transferring, bowel/bladder control, bathing, dressing, feeding, grooming, toileting, and psychosocial support Can the patient actively participate in an intensive therapy program of at least 3 hrs of therapy 5 days a week? Yes The potential for patient to make measurable gains while on inpatient rehab is excellent Anticipated functional outcomes upon discharge from inpatient rehab: supervision and min assist PT, supervision and min assist OT, n/a SLP Estimated rehab length of stay to reach the above functional goals is: 10-14 days Anticipated discharge destination: Home 10. Overall Rehab/Functional Prognosis: excellent      MD Signature: Meredith Staggers, MD, [place] Director Rehabilitation Services 10/06/2021          Revision History                               Note Details  Author Meredith Staggers, MD File Time 10/06/2021 10:27 AM  Author Type Physician Status Signed  Last Editor Meredith Staggers, MD Service Physical Medicine and Rehabilitation

## 2021-10-17 NOTE — Care Management CC44 (Signed)
Condition Code 44 Documentation Completed  Patient Details  Name: [name] MRN: [id number] Date of Birth: [birth year]   Condition Code 44 given:  Yes Patient signature on Condition Code 44 notice:  Yes Documentation of 2 MD's agreement:  Yes Code 44 added to claim:  Yes    Verdell Carmine, RN 10/17/2021, 10:28 AM

## 2021-10-17 NOTE — Progress Notes (Signed)
Can we please have documentation and clarification on weightbearing restrictions documented as we await therapy reevaluations after repair of right quadricep rupture

## 2021-10-17 NOTE — Care Management Obs Status (Signed)
[place] NOTIFICATION   Patient Details  Name: Revonda Menter MRN: [id number] Date of Birth: Jul 22, 1966   Medicare Observation Status Notification Given:  Yes    Verdell Carmine, RN 10/17/2021, 10:27 AM

## 2021-10-17 NOTE — Progress Notes (Signed)
Pt admitted to [phone]. Denies any pain or discomfort at this time. Oriented to rehab, call bell, and fall policy.   Gerald Stabs, RN

## 2021-10-17 NOTE — Progress Notes (Addendum)
Orthopaedics Daily Progress Note   10/17/2021   11:53 AM  [name] is a 56 y.o. female 1 Day Post-Op s/p REPAIR QUADRICEP TENDON  Subjective Pain well controlled.  Denies nausea, vomiting, or fevers. Made it back to inpatient rehab.  Objective Vitals:   10/17/21 0300 10/17/21 0823  BP: 131/60 (!) 158/78  Pulse: 91 90  Resp: 18 18  Temp: 98.1 F (36.7 C) 97.9 F (36.6 C)  SpO2: 93% 98%    Intake/Output Summary (Last 24 hours) at 10/17/2021 1153 Last data filed at 10/17/2021 0343 Gross per 24 hour  Intake 300 ml  Output 25 ml  Net 275 ml    Physical Exam RLE: Dressing clean, dry, and intact HKB appropriately positioned and locked in extension +DF/PF/EHL SILT SP/DP/T WWP distally  Assessment 56 y.o. female s/p Procedure(s) (LRB): REPAIR QUADRICEP TENDON (Right)  Plan Mobility: OOB w/ PT/OT Pain control: Continue to wean/titrate to appropriate oral regimen DVT Prophylaxis: Per Dr. Harlow Mares for L hip Foley catheter status: Not indicated Further surgical plans: None RUE: No restrictions LUE: No restrictions RLE: Touch-down weightbearing for 2 weeks; HKB locked in extension at all times Please see paper copy of Dr. Dierdre Highman quadriceps repair rehab protocol placed in patient's hard chart on 10/17/2021 for specific rehab instructions! LLE: Per Dr. Harlow Mares Disposition: Currently inpatient rehab following L hip surgery; dispo per rehab/primary team. Dressing care: Keep dressing on for 5 days and then removed.  Wound is sealed with Dermabond and no additional dressings required. Follow-up: Please call [facility] [phone]) to schedule follow-up appointment for 2 weeks after surgery.  I have verified that my discharge instructions and follow-up information have been entered in the Discharge Navigator in Epic.  These should automatically populate in the AVS.  Please print the AVS in its entirety and ensure that the patient or a responsible  party has a complete copy of the AVS before they are discharged.  If there are questions regarding discharge instructions or follow-up before the AVS is generated, please check the Discharge Navigator before attempting to contact the surgeon/office.  If unsure how to access the Discharge Navigator or the information contained in the Discharge Navigator, or how to generate/print the AVS, please contact the appropriate Nurse, learning disability.   Georgeanna Harrison M.D. Orthopaedic Surgery [facility]

## 2021-10-17 NOTE — Progress Notes (Signed)
Attempted lower extremity venous duplex, however patient is currently in HD. Will attempt again 2/18 as schedule permits.  10/17/2021 2:40 PM Kelby Aline., MHA, RVT, RDCS, RDMS

## 2021-10-17 NOTE — Anesthesia Postprocedure Evaluation (Signed)
Anesthesia Post Note  Patient: [name]  Procedure(s) Performed: REPAIR QUADRICEP TENDON (Right: Knee)     Patient location during evaluation: PACU Anesthesia Type: General Level of consciousness: sedated and patient cooperative Pain management: pain level controlled Vital Signs Assessment: post-procedure vital signs reviewed and stable Respiratory status: spontaneous breathing Cardiovascular status: stable Anesthetic complications: no   No notable events documented.  Last Vitals:  Vitals:   10/17/21 0823 10/17/21 1215  BP: (!) 158/78 139/61  Pulse: 90 86  Resp: 18 18  Temp: 36.6 C 36.9 C  SpO2: 98% 98%    Last Pain:  Vitals:   10/17/21 1215  TempSrc: Oral  PainSc:                  Nolon Nations

## 2021-10-17 NOTE — Progress Notes (Addendum)
Inpatient Rehabilitation Admissions Coordinator   I met at bedside with patient. Patient with an interrupted stay from [place]  on 10/16/21 due to return to acute for repair of right Quadriceps tendon repair with Dr Mable Fill. Case discussed with Dr Ranell Patrick and Dr Naaman Plummer, acute team and TOC. I will make the arrangements to return to CIR today. Otila Kluver in Hemodialysis made aware of admit.  Danne Baxter, RN, MSN Rehab Admissions Coordinator [phone] 10/17/2021 10:12 AM  Stable for admission today- Dr. Leeroy Cha

## 2021-10-17 NOTE — Discharge Instructions (Addendum)
Inpatient Rehab Discharge Instructions  [name] Discharge date and time: No discharge date for patient encounter.   Activities/Precautions/ Functional Status: Activity:  Weightbearing as tolerated left lower extremity with anterior hip precautions.  Touchdown weightbearing right lower extremity knee immobilizer locked in extension Diet: renal diet Wound Care: Routine skin checks Functional status:  ___ No restrictions     ___ Walk up steps independently ___ 24/7 supervision/assistance   ___ Walk up steps with assistance ___ Intermittent supervision/assistance  ___ Bathe/dress independently ___ Walk with walker     __x_ Bathe/dress with assistance ___ Walk Independently    ___ Shower independently ___ Walk with assistance    ___ Shower with assistance ___ No alcohol     ___ Return to work/school ________  Special Instructions: No driving smoking or alcohol  Continue hemodialysis as directed  Follow-up outpatient Dr.Lateef to coordinate for parathyroidectomy    COMMUNITY REFERRALS UPON DISCHARGE:    Home Health:   PT, [place]    Phone: [phone]   Medical Equipment/Items [place]                                                 Agency/Supplier:[facility] [phone]    My questions have been answered and I understand these instructions. I will adhere to these goals and the provided educational materials after my discharge from the hospital.  Patient/Caregiver Signature _______________________________ Date __________  Clinician Signature _______________________________________ Date __________  Please bring this form and your medication list with you to all your follow-up doctor's appointments.

## 2021-10-17 NOTE — Progress Notes (Signed)
Elizabeth Ribas, MD  Physician Physical Medicine and Rehabilitation Progress Notes    Signed Date of Service:  10/17/2021 11:59 AM  Related encounter: Admission (Current) from 10/16/2021 in [facility]   Signed      Expand All Collapse All Show:Clear all [x] Written[x] Templated[x] Copied  Added by: [x] Raulkar, Clide Deutscher, MD  [] Hover for details                                                                                                                                               PROGRESS NOTE     Subjective/Complaints: No new complaints this morning Excited to return to rehab Feels motivated that she will now be able to tolerate three hours of therapy per day     ROS: Constipation now improved, +pain in bilateral lower extremities, +loose incontinent stool improved, minimal postop pain in right leg   Objective:    Imaging Results (Last 48 hours)  DG C-Arm 1-60 Min-No Report   Result Date: 10/16/2021 Fluoroscopy was utilized by the requesting physician.  No radiographic interpretation.    DG HIP UNILAT WITH PELVIS 2-3 VIEWS LEFT   Result Date: 10/16/2021 CLINICAL DATA:  Left hip  REPAIR QUADRICEP TENDON EXAM: DG HIP (WITH OR WITHOUT PELVIS) 2-3V LEFT COMPARISON:  X-ray left hip 10/02/2021 FINDINGS: Intraoperative repair of the quadriceps tendon per history. One low resolution intraoperative spot views of the left hip were obtained in a patient status post total left hip arthroplasty. No fracture visible on the limited views. Total fluoroscopy time: 10.3 seconds Total radiation dose: 1.8 mGy IMPRESSION: Intraoperative repair of the quadriceps tendon in a patient status post total left hip arthroplasty. Electronically Signed   By: Iven Finn M.D.   On: 10/16/2021 17:12     Recent Labs (last 2 labs)       Recent Labs    10/16/21 1335 10/17/21 0139  WBC  --  11.8*  HGB 10.9* 8.9*  HCT 32.0* 27.3*  PLT  --  279           Recent Labs (last 2 labs)       Recent Labs    10/16/21 1335 10/17/21 0139  NA 136 134*  K 4.4 5.2*  CL 100 96*  CO2  --  23  GLUCOSE 166* 255*  BUN 31* 43*  CREATININE 5.40* 5.91*  CALCIUM  --  9.2            Intake/Output Summary (Last 24 hours) at 10/17/2021 1200 Last data filed at 10/17/2021 0343    Gross per 24 hour  Intake 300 ml  Output 25 ml  Net 275 ml          Physical Exam: Vital Signs Blood pressure (!) 158/78, pulse 90, temperature 97.9 F (36.6 C), temperature source Oral, resp. rate 18, SpO2 98 %.  General: awake, alert, appropriate, laying in bed- NAD, BMI 26.66 HENT: conjugate gaze; oropharynx moist CV: regular rate; no JVD Pulmonary: CTA B/L; no W/R/R- good air movement GI: much more soft, NT, ND, (+)BS- hypoactive still Psychiatric: appropriate Neurological: Ox3 Musculoskeletal:        General: Swelling and tenderness present.     Cervical back: Normal range of motion and neck supple.     Right lower leg: Edema present.     Left lower leg: Edema present.     Comments: Left hip tender to palpation and PROM with associated swelling. Left knee with mild effusion, patella laxity. Right hip tender with leg raise  Active range of motion by patient with good improvements. Right knee in immobilizer.  Right knee tender and swollen Able to get out of bed today Skin:    Comments: Incision site dressing clean dry and intact.  R IJ TDC with bandage Neurological:     Comments: Patient is alert.  No acute distress.  Oriented x3 and follows commands. Alert and oriented x 3. Normal insight and awareness. Intact Memory. Normal language and speech. Cranial nerve exam unremarkable. UE motor 5/5. LE motor limited proximally by pain. ADF/PF 4-5/5. No sensory findings.   Psychiatric:     Comments: Pleasant, slightly anxious  GU: stool incontinence in diaper     Assessment/Plan: 1. Functional deficits which require 3+ hours per day of  interdisciplinary therapy in a comprehensive inpatient rehab setting. Physiatrist is providing close team supervision and 24 hour management of active medical problems listed below. Physiatrist and rehab team continue to assess barriers to discharge/monitor patient progress toward functional and medical goals   Care Tool:   Bathing          Bathing assist    Upper Body Dressing/Undressing Upper body dressing   Upper body assist   Lower Body Dressing/Undressing Lower body dressing              Lower body assist     Toileting Toileting   Toileting assist    Transfers Chair/bed transfer   Transfers assist        Locomotion Ambulation     Ambulation assist           Walk 10 feet activity     Assist         Walk 50 feet activity     Assist         Walk 150 feet activity     Assist        Walk 10 feet on uneven surface  activity     Assist         Wheelchair         Assist         Wheelchair 50 feet with 2 turns activity       Assist               Wheelchair 150 feet activity        Assist           Blood pressure (!) 158/78, pulse 90, temperature 97.9 F (36.6 C), temperature source Oral, resp. rate 18, SpO2 98 %.      Medical Problem List and Plan: 1. Functional deficits secondary to nondisplaced fracture right inferior pubic ramus as well as a displaced foreshortened fracture through the left femoral neck/intertrochanteric area.  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   -Weightbearing as tolerated with anterior total hip precautions.                                  -  Conservative care right inferior pubic ramus fracture, weightbearing as tolerated             -patient may  shower             -ELOS/Goals: 10-14 days, supervision to min assist goals             Discussed with team plan for surgery on Thursday. NPO at midnight on Wednesday             Interrupted stay- returning to rehab today              -HFU scheduled 2.  Antithrombotics: -DVT/anticoagulation:  Mechanical: Antiembolism stockings, thigh (TED hose) Bilateral lower extremities check vascular study.   -should be able to begin lovenox today as permacath placed            -antiplatelet therapy: N/A 3. Femur fracture pain: continue Flector patch,Robaxin 750 mg 4 times daily, hydrocodone and oxycodone as needed             -pt having significant spasms in left thigh.             -add kpad to help with spasms             -schedule tylenol 1000mg  TID 4. Anxiety: Instructed on deep breathing techniques. Provide emotional support.  Melatonin as needed             -antipsychotic agents: N/A 5. Neuropsych: This patient is capable of making decisions on her own behalf. 6. Skin/Wound Care: Routine skin checks 7. Fluids/Electrolytes/Nutrition: Routine in and outs with follow-up chemistries 8.  End-stage renal disease/hemodialysis.  Permacath exchanged 10/03/2021.    -  Follow-up hemodialysis per Dr. [name] -HD later in day to allow participation in therapies during the day 9.  Acute on chronic anemia.  Has had transfusions already x 2. Hgb has stabilized in 7 range. Continue iron supplement.              -epo per nephrology 10.  Diabetes mellitus with peripheral neuropathy. Placed order that she may use her Dexcom 6. Hemoglobin A1c 6.2.  SSI as prior to admission. Discuss Qutenza. Discussed with nursing to continue insulin despite NPO given her elevated CBG 11.  Hypertension. Continue cozaar 12.  Hyperparathyroidism of renal origin.  Plan outpatient parathyroidectomy per Dr.Lateef 13.  Overweight.  BMI 29.11.  Dietary follow-up. Provided list of foods that can help with weight loss.  14. Cushingoid?--outpt work up 68. Constipation: resolved. Decrease colace to daily prn. D/c senna. Discussed benefits of high fiber foods 16. Right quadriceps tear: surgery complete with good healing on XR, minimal postop pain, return to rehab today,  discussed importance of participating in 3 hours therapy daily as much as she can tolerate and she is very motivated and excited for rehab 17. Gas: continue simethicone 18. Ileus: resolved, regular diet resumed -tolerating.  19. Abd spasms: resolved 20. Black tarry stools: discussed heme occult negative last night. Hgb reviewed and stable.      LOS: 1 days A FACE TO FACE EVALUATION WAS PERFORMED   [name] 10/17/2021, 12:00 PM               Note Details  Author Ranell Patrick, Clide Deutscher, MD File Time 10/17/2021 12:02 PM  Author Type Physician Status Signed  Last Editor Elizabeth Ribas, MD Service Physical Medicine and Rehabilitation

## 2021-10-17 NOTE — H&P (Incomplete)
Physical Medicine and Rehabilitation Admission H&P     HPI: [name] is a 56 year old right-handed female with history of end-stage renal disease with hemodialysis via right IJ, diabetes mellitus with neuropathy, acute on chronic anemia hyperlipidemia obesity with BMI 27.76, secondary hyperparathyroid of renal origin and currently awaiting outpatient scheduling for evaluation of parathyroidectomy.  Patient lives with spouse modified independent prior to admission.  Presented to [facility] 09/28/2021 with complaints of severe left hip pain.  Patient states her leg gave out she heard a pop on the left hip.  She also had complaints of right side hip pain from a fall.  X-rays and imaging revealed displaced foreshortened fracture of the neck and intertrochanteric left femur as well as acute nondisplaced fracture of the right inferior pubic ramus.  Patient underwent left hip anterior hip hemiarthroplasty 09/29/2021 per Dr. Jorge Mandril.  Conservative care of right inferior pubic ramus fracture.  Patient was advised weightbearing as tolerated left lower extremity as well as weightbearing as tolerated right lower extremity.  Hospital course ongoing vascular surgery follow-up consulted for permacath placement and exchange from right IJ to better dialysis 10/03/2021.  Acute on chronic anemia monitored for any bleeding episodes.  Therapy evaluations completed patient was admitted inpatient rehab services 10/06/2021.  Patient had been cleared to begin Lovenox for DVT prophylaxis after permacath has been placed.  Orthopedic services consulted 10/09/2021 for right knee pain her MRI showed nearly complete quad tendon tear and patient was discharged to acute care services  underwent right quadricep repair 10/16/2021 per Dr. Mable Fill.  Patient is touchdown weightbearing right lower extremity with knee immobilizer locked in extension.  Weightbearing as tolerated left lower extremity with anterior hip precautions.  Hemodialysis ongoing as  per renal services.  Patient is readmitted to inpatient rehab services to continue comprehensive therapies.  Review of Systems  Constitutional: Negative.  Negative for fever.  HENT:  Negative for hearing loss.   Eyes:  Negative for blurred vision and double vision.  Respiratory:  Negative for cough and shortness of breath.   Cardiovascular:  Negative for chest pain, palpitations and leg swelling.  Gastrointestinal:  Positive for constipation. Negative for heartburn, nausea and vomiting.       GERD  Genitourinary:  Negative for dysuria, flank pain and hematuria.  Musculoskeletal:  Positive for falls and myalgias.  Skin:  Negative for rash.  Neurological:  Positive for weakness.  Psychiatric/Behavioral:         Anxiety  All other systems reviewed and are negative. Past Medical History:  Diagnosis Date   Anemia    vitamin d3 deficiency   Anxiety    Chronic kidney disease    End Stage Renal Disease   Diabetes mellitus without complication (HCC)    GERD (gastroesophageal reflux disease)    nothing over last few years   High serum parathyroid hormone (PTH)    checked through Dialysis   History of kidney stones 2000   Hypertension    Neuromuscular disorder (HCC)    neuropathy in feet   PONV (postoperative nausea and vomiting)    severe nausea requiring many doses of post op antiemetics   Stroke ([place]) 10/2017   thinks she had a series of mini strokes.right leg up to right side of face were numb. no loss of consciousness   Past Surgical History:  Procedure Laterality Date   ABDOMINAL HYSTERECTOMY  2007   AV FISTULA INSERTION W/ RF MAGNETIC GUIDANCE N/A 12/08/2017   Procedure: AV FISTULA INSERTION W/RF MAGNETIC  GUIDANCE;  Surgeon: Katha Cabal, MD;  Location: [facility];  Service: Cardiovascular;  Laterality: N/A;   DIALYSIS/PERMA CATHETER INSERTION Right 10/03/2021   Procedure: DIALYSIS/PERMA CATHETER INSERTION;  Surgeon: Katha Cabal, MD;  Location: [facility];  Service: Cardiovascular;  Laterality: Right;   HIP ARTHROPLASTY Left 09/29/2021   Procedure: ARTHROPLASTY BIPOLAR HIP (HEMIARTHROPLASTY);  Surgeon: Lovell Sheehan, MD;  Location: [facility];  Service: Orthopedics;  Laterality: Left;   ROTATOR CUFF REPAIR Right 2004   SHOULDER CLOSED REDUCTION Right 2004   UPPER EXTREMITY VENOGRAPHY Left 02/15/2018   Procedure: UPPER EXTREMITY VENOGRAPHY;  Surgeon: Katha Cabal, MD;  Location: [facility];  Service: Cardiovascular;  Laterality: Left;   Family History  Problem Relation Age of Onset   Emphysema Mother    COPD Mother    Cancer Father    Cancer Paternal Aunt    Diabetes Sister    Hypertension Sister    Eczema Sister    Diabetes Brother    Hypertension Brother    Cardiomyopathy Brother    Alcohol abuse Brother    Social History:  reports that she has never smoked. She has never used smokeless tobacco. She reports that she does not drink alcohol and does not use drugs. Allergies:  Allergies  Allergen Reactions   Enalapril Hives and Other (See Comments)    Angioedema face.   Ivp Dye [Iodinated Contrast Media] Hives   Lisinopril Shortness Of Breath   Shellfish Allergy Swelling and Other (See Comments)    patient tolerates shellfish by mouth without problem   Medications Prior to Admission  Medication Sig Dispense Refill   acetaminophen (TYLENOL) 500 MG tablet Take 2 tablets (1,000 mg total) by mouth 3 (three) times daily. 30 tablet 0   ascorbic acid (VITAMIN C) 500 MG tablet Take 1 tablet (500 mg total) by mouth daily.     Darbepoetin Alfa (ARANESP) 40 MCG/0.4ML SOSY injection Inject 0.4 mLs (40 mcg total) into the vein every Friday with hemodialysis. 8.4 mL    diclofenac (FLECTOR) 1.3 % PTCH Place 1 patch onto the skin 2 (two) times daily. 60 patch    diclofenac Sodium (VOLTAREN) 1 % GEL Apply 2 g topically 4 (four) times daily.     enoxaparin (LOVENOX) 30 MG/0.3ML injection Inject 0.3 mLs (30 mg total)  into the skin daily. 0 mL    ferric citrate (AURYXIA) 1 GM 210 MG(Fe) tablet Take 1 tablet (210 mg total) by mouth as needed (with snacks). 270 tablet    ferric citrate (AURYXIA) 1 GM 210 MG(Fe) tablet Take 2 tablets (420 mg total) by mouth 3 (three) times daily with meals. 270 tablet    HYDROcodone-acetaminophen (NORCO/VICODIN) 5-325 MG tablet Take 1 tablet by mouth every 4 (four) hours as needed for moderate pain. 30 tablet 0   insulin aspart (NOVOLOG) 100 UNIT/ML injection Inject 0-6 Units into the skin 3 (three) times daily with meals. 10 mL 11   losartan (COZAAR) 50 MG tablet Take 50 mg by mouth daily.      methocarbamol (ROBAXIN) 750 MG tablet Take 1 tablet (750 mg total) by mouth 4 (four) times daily.     multivitamin (RENA-VIT) TABS tablet Take 1 tablet by mouth at bedtime.  0   Nutritional Supplements (FEEDING SUPPLEMENT, NEPRO CARB STEADY,) LIQD Take 237 mLs by mouth 2 (two) times daily between meals.  0   oxyCODONE (OXY IR/ROXICODONE) 5 MG immediate release tablet Take 1 tablet (5 mg  total) by mouth every 4 (four) hours as needed for moderate pain. 30 tablet 0      Home:     Functional History:    Functional Status:  Mobility:          ADL:    Cognition: Cognition Orientation Level: Oriented X4    Physical Exam: Blood pressure (!) 158/78, pulse 90, temperature 97.9 F (36.6 C), temperature source Oral, resp. rate 18, SpO2 98 %. Physical Exam Neurological:     Comments: Patient is alert.  No acute distress and oriented x3.  Knee immobilizer in place to right lower extremity.    Results for orders placed or performed during the hospital encounter of 10/16/21 (from the past 48 hour(s))  Glucose, capillary     Status: Abnormal   Collection Time: 10/16/21  4:54 PM  Result Value Ref Range   Glucose-Capillary 172 (H) 70 - 99 mg/dL    Comment: Glucose reference range applies only to samples taken after fasting for at least 8 hours.  Glucose, capillary     Status:  Abnormal   Collection Time: 10/16/21  7:17 PM  Result Value Ref Range   Glucose-Capillary 321 (H) 70 - 99 mg/dL    Comment: Glucose reference range applies only to samples taken after fasting for at least 8 hours.  Glucose, capillary     Status: Abnormal   Collection Time: 10/16/21  7:46 PM  Result Value Ref Range   Glucose-Capillary 282 (H) 70 - 99 mg/dL    Comment: Glucose reference range applies only to samples taken after fasting for at least 8 hours.  CBC     Status: Abnormal   Collection Time: 10/17/21  1:39 AM  Result Value Ref Range   WBC 11.8 (H) 4.0 - 10.5 K/uL   RBC 2.78 (L) 3.87 - 5.11 MIL/uL   Hemoglobin 8.9 (L) 12.0 - 15.0 g/dL   HCT 27.3 (L) 36.0 - 46.0 %   MCV 98.2 80.0 - 100.0 fL   MCH 32.0 26.0 - 34.0 pg   MCHC 32.6 30.0 - 36.0 g/dL   RDW 15.3 11.5 - 15.5 %   Platelets 279 150 - 400 K/uL   nRBC 0.0 0.0 - 0.2 %    Comment: Performed at [facility], [place] [address], [place], [place] [address]  Renal function panel     Status: Abnormal   Collection Time: 10/17/21  1:39 AM  Result Value Ref Range   Sodium 134 (L) 135 - 145 mmol/L   Potassium 5.2 (H) 3.5 - 5.1 mmol/L   Chloride 96 (L) 98 - 111 mmol/L   CO2 23 22 - 32 mmol/L   Glucose, Bld 255 (H) 70 - 99 mg/dL    Comment: Glucose reference range applies only to samples taken after fasting for at least 8 hours.   BUN 43 (H) 6 - 20 mg/dL   Creatinine, Ser 5.91 (H) 0.44 - 1.00 mg/dL   Calcium 9.2 8.9 - 10.3 mg/dL   Phosphorus 4.4 2.5 - 4.6 mg/dL   Albumin 3.3 (L) 3.5 - 5.0 g/dL   GFR, Estimated 8 (L) >60 mL/min    Comment: (NOTE) Calculated using the CKD-EPI Creatinine Equation (2021)    Anion gap 15 5 - 15    Comment: Performed at [facility] [address], [place], [place] [address]  Glucose, capillary     Status: Abnormal   Collection Time: 10/17/21  8:42 AM  Result Value Ref Range   Glucose-Capillary  170 (H) 70 - 99 mg/dL    Comment: Glucose reference range applies only to samples taken  after fasting for at least 8 hours.   DG C-Arm 1-60 Min-No Report  Result Date: 10/16/2021 Fluoroscopy was utilized by the requesting physician.  No radiographic interpretation.   DG HIP UNILAT WITH PELVIS 2-3 VIEWS LEFT  Result Date: 10/16/2021 CLINICAL DATA:  Left hip  REPAIR QUADRICEP TENDON EXAM: DG HIP (WITH OR WITHOUT PELVIS) 2-3V LEFT COMPARISON:  X-ray left hip 10/02/2021 FINDINGS: Intraoperative repair of the quadriceps tendon per history. One low resolution intraoperative spot views of the left hip were obtained in a patient status post total left hip arthroplasty. No fracture visible on the limited views. Total fluoroscopy time: 10.3 seconds Total radiation dose: 1.8 mGy IMPRESSION: Intraoperative repair of the quadriceps tendon in a patient status post total left hip arthroplasty. Electronically Signed   By: Iven Finn M.D.   On: 10/16/2021 17:12      Blood pressure (!) 158/78, pulse 90, temperature 97.9 F (36.6 C), temperature source Oral, resp. rate 18, SpO2 98 %.  Medical Problem List and Plan: 1. Functional deficits secondary to nondisplaced fracture right inferior pubic ramus as well as displaced foreshortened fractures of the left femoral neck/intertrochanteric area.  Status post anterior hip hemiarthroplasty 09/29/2021 per Dr. Harlow Mares.  Weightbearing as tolerated left lower extremity with anterior hip precautions.  -patient may *** shower  -ELOS/Goals: *** 2.  Antithrombotics: -DVT/anticoagulation:  Pharmaceutical: Lovenox check vascular study  -antiplatelet therapy: N/A 3. Pain Management: Hydrocodone as needed 4. Mood: Provide emotional support  -antipsychotic agents: N/A 5. Neuropsych: This patient is capable of making decisions on her own behalf. 6. Skin/Wound Care: Routine skin checks 7. Fluids/Electrolytes/Nutrition: Routine in and outs with follow-up chemistries 8.  Right quadricep rupture.  Status post right quadricep repair 10/16/2021 per Dr. Mable Fill.   Touchdown weightbearing knee immobilizer locked in extension. 9.  End-stage renal disease.  Continue hemodialysis as directed. 10.  Acute on chronic anemia.  Follow-up CBC 11.  Diabetes mellitus peripheral neuropathy.  Hemoglobin A1c 6.2.  SSI 12.  Hypertension.  Cozaar 50 mg daily 13.  Hyperparathyroidism of renal origin.  Plan outpatient parathyroidectomy 14.  Obesity.  BMI 27.76.  Dietary follow-up.    Lavon Paganini Lorene Samaan, PA-C 10/17/2021

## 2021-10-17 NOTE — Progress Notes (Signed)
Izora Ribas, MD  Physician Physical Medicine and Rehabilitation Progress Notes    Addendum Date of Service:  10/17/2021 10:08 AM  Related encounter: Admission (Current) from 10/16/2021 in [facility]   Show:Clear all _0 Written_1 Templated_2 Copied  Added by: _3 Cristina Gong, RN_4 Ranell Patrick Clide Deutscher, MD  _5 Hover for details                                                             Inpatient Rehabilitation Admissions Coordinator    I met at bedside with patient. Patient with an interrupted stay from [place]  on 10/16/21 due to return to acute for repair of right Quadriceps tendon repair with Dr Mable Fill. Case discussed with Dr Ranell Patrick and Dr Naaman Plummer, acute team and TOC. I will make the arrangements to return to CIR today. Otila Kluver in Hemodialysis made aware of admit.   Danne Baxter, RN, MSN Rehab Admissions Coordinator [phone] 10/17/2021 10:12 AM   Stable for admission today- Dr. Leeroy Cha     Revision History

## 2021-10-18 DIAGNOSIS — S72142D Displaced intertrochanteric fracture of left femur, subsequent encounter for closed fracture with routine healing: Secondary | ICD-10-CM

## 2021-10-18 LAB — GLUCOSE, CAPILLARY
Glucose-Capillary: 173 mg/dL — ABNORMAL HIGH (ref 70–99)
Glucose-Capillary: 208 mg/dL — ABNORMAL HIGH (ref 70–99)
Glucose-Capillary: 216 mg/dL — ABNORMAL HIGH (ref 70–99)
Glucose-Capillary: 221 mg/dL — ABNORMAL HIGH (ref 70–99)

## 2021-10-18 MED ORDER — CHLORHEXIDINE GLUCONATE CLOTH 2 % EX PADS
6.0000 | MEDICATED_PAD | Freq: Every day | CUTANEOUS | Status: DC
  Administered 2021-10-19 – 2021-11-08 (×17): 6 via TOPICAL

## 2021-10-18 NOTE — Evaluation (Signed)
Physical Therapy Assessment and Plan  Patient Details  Name: [name] MRN: [id number] Date of Birth: [birth year]  PT Diagnosis: Abnormal posture, Abnormality of gait, Difficulty walking, Edema, Muscle weakness, Pain in joint, and Pain in B LEs Rehab Potential: Fair ELOS: ~ 3 weeks   Today's Date: 10/18/2021 PT Individual Time: 1110-1211 PT Individual Time Calculation (min): 61 min    Hospital Problem: Principal Problem:   Intertrochanteric fracture of left hip ([place])   Past Medical History:  Past Medical History:  Diagnosis Date   Anemia    vitamin d3 deficiency   Anxiety    Chronic kidney disease    End Stage Renal Disease   Diabetes mellitus without complication ([place])    GERD (gastroesophageal reflux disease)    nothing over last few years   High serum parathyroid hormone (PTH)    checked through Dialysis   History of kidney stones 2000   Hypertension    Neuromuscular disorder ([place])    neuropathy in feet   PONV (postoperative nausea and vomiting)    severe nausea requiring many doses of post op antiemetics   Stroke ([place]) 10/2017   thinks she had a series of mini strokes.right leg up to right side of face were numb. no loss of consciousness   Past Surgical History:  Past Surgical History:  Procedure Laterality Date   ABDOMINAL HYSTERECTOMY  2007   AV FISTULA INSERTION W/ RF MAGNETIC GUIDANCE N/A 12/08/2017   Procedure: AV FISTULA INSERTION W/RF MAGNETIC GUIDANCE;  Surgeon: Katha Cabal, MD;  Location: [place] [facility];  Service: Cardiovascular;  Laterality: N/A;   DIALYSIS/PERMA CATHETER INSERTION Right 10/03/2021   Procedure: DIALYSIS/PERMA CATHETER INSERTION;  Surgeon: Katha Cabal, MD;  Location: [place] [facility];  Service: Cardiovascular;  Laterality: Right;   HIP ARTHROPLASTY Left 09/29/2021   Procedure: ARTHROPLASTY BIPOLAR HIP (HEMIARTHROPLASTY);  Surgeon: Lovell Sheehan, MD;  Location: [facility];  Service: Orthopedics;  Laterality: Left;    QUADRICEPS TENDON REPAIR Right 10/16/2021   Procedure: REPAIR QUADRICEP TENDON;  Surgeon: Georgeanna Harrison, MD;  Location: [facility];  Service: Orthopedics;  Laterality: Right;   ROTATOR CUFF REPAIR Right 2004   SHOULDER CLOSED REDUCTION Right 2004   UPPER EXTREMITY VENOGRAPHY Left 02/15/2018   Procedure: UPPER EXTREMITY VENOGRAPHY;  Surgeon: Katha Cabal, MD;  Location: [place] [facility];  Service: Cardiovascular;  Laterality: Left;    Assessment & Plan Clinical Impression: Patient is a 56 y.o. year old right-handed female with history of end-stage renal disease with hemodialysis via right IJ, diabetes mellitus with neuropathy, acute on chronic anemia, hyperlipidemia, obesity with BMI 27.76, secondary hyperparathyroidism of renal origin and currently awaiting outpatient scheduling for evaluation of parathyroidectomy, hypertension.  Per chart review patient lives with spouse.  1 level home 5 steps to entry.  Modified independent with Rollator for household and community ambulation and bilateral axillary crutches for stair negotiation.  Presented to [facility]/28/2021 with complaints of severe left hip pain.  Patient states her leg gave out and she heard a pop on the left hip.  She also had complaints of right side hip pain from a fall.  X-rays and imaging revealed displaced foreshortened fracture of the neck and intertrochanteric left femur as well as acute nondisplaced fracture of the right inferior pubic ramus.  Patient underwent left hip anterior hip hemiarthroplasty 09/29/2021 per Dr. Kurtis Bushman.  Conservative care of right inferior pubic ramus fracture.  Patient is currently weightbearing as tolerated left lower extremity as well  as weightbearing as tolerated right lower extremity.  Hospital course hemodialysis ongoing vascular surgery Dr Rica Koyanagi consulted to evaluate for exchange PermCath from right IJ to better dialysis 10/03/2021.  Hospital course acute on chronic anemia latest hemoglobin 6.7 of  which she was transfused and follow-up hemoglobin 7.7 and patient also remains on iron supplement.  Therapy evaluations completed due to patient decreased functional mobility was admitted for a comprehensive rehab program.  Patient transferred back to acute on 2/16 for R quadriceps tendon repair surgery and has now returned to CIR to continue her rehab program. Patient transferred to [place] on 10/17/2021 .   Patient currently requires mod assist with wheelchair level mobility secondary to muscle weakness and muscle joint tightness, decreased cardiorespiratoy endurance, and decreased sitting balance, decreased standing balance, decreased balance strategies, and difficulty maintaining precautions.  Prior to hospitalization, patient was modified independent  using rollator vs crutches with mobility and lived with Spouse in a House home.  Home access is 5Stairs to enter.  Patient will benefit from skilled PT intervention to maximize safe functional mobility, minimize fall risk, and decrease caregiver burden for planned discharge home with 24 hour assist.  Anticipate patient will benefit from follow up [facility] at discharge.  PT - End of Session Activity Tolerance: Tolerates 30+ min activity with multiple rests Endurance Deficit: Yes Endurance Deficit Description: required frequent breaks PT Assessment Rehab Potential (ACUTE/IP ONLY): Fair PT Barriers to Discharge: [place] home environment;Decreased caregiver support;Home environment access/layout;Wound Care;Hemodialysis;Other (comments) PT Barriers to Discharge Comments: 5 STE home wiht B HRs, only PRN support at D/C PT Patient demonstrates impairments in the following area(s): Balance;Perception;Behavior;Safety;Edema;Sensory;Endurance;Skin Integrity;Motor;Nutrition;Pain PT Transfers Functional Problem(s): Bed Mobility;Bed to Chair;Car;Furniture PT Locomotion Functional Problem(s): Ambulation;Wheelchair Mobility;Stairs PT Plan PT Intensity: Minimum of 1-2  x/day ,45 to 90 minutes PT Frequency: 5 out of 7 days PT Duration Estimated Length of Stay: ~ 3 weeks PT Treatment/Interventions: Ambulation/gait training;Discharge planning;Functional mobility training;Psychosocial support;Therapeutic Activities;Balance/vestibular training;Disease management/prevention;Neuromuscular re-education;Skin care/wound management;Therapeutic Exercise;Wheelchair propulsion/positioning;Cognitive remediation/compensation;DME/adaptive equipment instruction;Pain management;Splinting/orthotics;UE/LE Strength taining/ROM;Community reintegration;Patient/family education;Stair training;UE/LE Coordination activities PT Transfers Anticipated Outcome(s): CGA using LRAD PT Locomotion Anticipated Outcome(s): min assist limited distances using LRAD PT Recommendation Recommendations for Other Services: Therapeutic Recreation consult Therapeutic Recreation Interventions: Stress management Follow Up Recommendations: Home health PT;24 hour supervision/assistance Patient destination: Home Equipment Recommended: To be determined Equipment Details: has rollator and axillary crutches   PT Re-Evaluation Precautions/Restrictions Precautions Precautions: Fall;Anterior Hip;Other (comment) Precaution Comments: HD access port RUQ, L LE anterior hip precautions, R LE rehab protocol in pt's paper chart Required Braces or Orthoses: Knee Immobilizer - Right Knee Immobilizer - Right: On at all times;Other (comment) (locked into extension) Restrictions Weight Bearing Restrictions: Yes RLE Weight Bearing: Touchdown weight bearing (touch down weight bearing) LLE Weight Bearing: Weight bearing as tolerated Other Position/Activity Restrictions: L LE anterior hip precautions - no hip extension, hip external rotation, or abduction Pain Pain Assessment Pain Scale: 0-10 Pain Score: 10-Worst pain ever Pain Type: Acute pain Pain Location: Knee Pain Orientation: Right Pain Descriptors / Indicators:  Other (Comment) (states it feels "very surgical") Pain Onset: On-going Patients Stated Pain Goal: 2 Pain Intervention(s): Medication (See eMAR);Relaxation;Rest;Emotional support;Distraction Pain Interference Pain Interference Pain Effect on Sleep: 1. Rarely or not at all Pain Interference with Therapy Activities: 3. Frequently (before surgery) Pain Interference with Day-to-Day Activities: 3. Frequently Home Living/Prior Functioning Home Living Available Help at Discharge: Family;Available PRN/intermittently (husband, Aaron Edelman, works full time) Type of Home: House Home Access: Stairs to enter [employer] of Steps: 5  Entrance Stairs-Rails: Right;Left Home Layout: One level Additional Comments: using rollator for past 3-4 months and using crutches for stairs  Lives With: Spouse Aaron Edelman) Prior Function Level of Independence: Requires assistive device for independence;Independent with transfers;Independent with gait (has been using axillary crutches off and on for a couple months in community and to go up/down stairs to get in/out home - would use rollator in home - greatest difficulty was with sit<>stands & pt would rely heavily on her arms to come up)  Able to Take Stairs?: Yes Driving: Yes Vocation: Retired Vision/Perception  Vision - History Ability to See in Adequate Light: 0 Adequate Perception Perception: Within Functional Limits Praxis Praxis: Intact  Cognition Overall Cognitive Status: Within Functional Limits for tasks assessed Arousal/Alertness: Awake/alert Orientation Level: Oriented X4 Year: 2023 Month: February Day of Week: Correct Attention: Focused;Sustained;Selective Focused Attention: Appears intact Sustained Attention: Appears intact Selective Attention: Appears intact Memory: Appears intact Safety/Judgment: Appears intact Sensation Sensation Light Touch: Appears Intact Hot/Cold: Not tested Stereognosis: Not tested Coordination Gross Motor  Movements are Fluid and Coordinated: No Coordination and Movement Description: generalized weakness/deconditioning, WBing restrictions in B LEs impacting movement patterns Motor  Motor Motor: Abnormal postural alignment and control Motor - Skilled Clinical Observations: generalized weakness/deconditioning, WBing restrictions in LEs   Trunk/Postural Assessment  Cervical Assessment Cervical Assessment: Within Functional Limits Thoracic Assessment Thoracic Assessment: Exceptions to [facility] (rounded shoulders) Lumbar Assessment Lumbar Assessment: Exceptions to [facility] (posterior pelvic tilt)  Balance  Balance Balance Assessed: Yes Static Sitting Balance Static Sitting - Balance Support: Bilateral upper extremity supported;Feet supported Static Sitting - Level of Assistance: 6: Modified independent (Device/Increase time) Dynamic Sitting Balance Dynamic Sitting - Balance Support: Feet supported;No upper extremity supported Dynamic Sitting - Level of Assistance: 5: Stand by assistance;Other (comment) (CGA) Extremity Assessment      RLE Assessment RLE Assessment: Exceptions to [facility] Passive Range of Motion (PROM) Comments: locked into knee extension in Bledsoe brace - WFL at hip and ankle General Strength Comments: grossly 3-/5 to 3/5 in hip and 3+/5 in ankle DF/PF LLE Assessment LLE Assessment: Exceptions to [facility] Passive Range of Motion (PROM) Comments: limited knee flexion ROM due to muscle length shortening Active Range of Motion (AROM) Comments: limited due to weakness LLE Strength Left Hip Flexion: 2-/5 Left Hip Extension: 2-/5 Left Hip ABduction: 2-/5 Left Hip ADduction: 2/5 Left Knee Flexion: 2+/5 (within available range) Left Knee Extension: 3-/5 (lacks terminal knee extension) Left Ankle Dorsiflexion: 3/5 Left Ankle Plantar Flexion: 3/5  Care Tool Care Tool Bed Mobility Roll left and right activity   Roll left and right assist level: Minimal Assistance - Patient > 75%    Sit to  lying activity   Sit to lying assist level: Maximal Assistance - Patient 25 - 49%    Lying to sitting on side of bed activity   Lying to sitting on side of bed assist level: the ability to move from lying on the back to sitting on the side of the bed with no back support.: Maximal Assistance - Patient 25 - 49%     Care Tool Transfers Sit to stand transfer Sit to stand activity did not occur: Safety/medical concerns      Chair/bed transfer   Chair/bed transfer assist level: Moderate Assistance - Patient 50 - 74% Chair/bed transfer assistive device: Doctor, general practice transfer activity did not occur: Safety/medical concerns        Care Tool Locomotion  Ambulation Ambulation activity did not occur: Safety/medical concerns        Walk 10 feet activity Walk 10 feet activity did not occur: Safety/medical concerns       Walk 50 feet with 2 turns activity Walk 50 feet with 2 turns activity did not occur: Safety/medical concerns      Walk 150 feet activity Walk 150 feet activity did not occur: Safety/medical concerns      Walk 10 feet on uneven surfaces activity Walk 10 feet on uneven surfaces activity did not occur: Safety/medical concerns      Stairs Stair activity did not occur: Safety/medical concerns        Walk up/down 1 step activity Walk up/down 1 step or curb (drop down) activity did not occur: Safety/medical concerns      Walk up/down 4 steps activity Walk up/down 4 steps activity did not occur: Safety/medical concerns      Walk up/down 12 steps activity Walk up/down 12 steps activity did not occur: Safety/medical concerns      Pick up small objects from floor Pick up small object from the floor (from standing position) activity did not occur: Safety/medical concerns      Wheelchair Is the patient using a wheelchair?: Yes Type of Wheelchair: Manual   Wheelchair assist level: Supervision/Verbal cueing;Set up assist Max  wheelchair distance: 175f  Wheel 50 feet with 2 turns activity   Assist Level: Supervision/Verbal cueing  Wheel 150 feet activity   Assist Level: Supervision/Verbal cueing    Refer to Care Plan for Long Term Goals  SHORT TERM GOAL WEEK 1 PT Short Term Goal 1 (Week 1): Pt will perform supine<>sit with mod assist of 1 consistently PT Short Term Goal 2 (Week 1): Pt will perform bed<>chair transfers using LRAD with mod assist of 1 consistently PT Short Term Goal 3 (Week 1): Pt will perform sit<>stand using LRAD with +2 mod assist  Recommendations for other services: Therapeutic Recreation  Stress management  Skilled Therapeutic Intervention Pt received sitting in recliner and agreeable to therapy session. Pt wearing R LE Bledsoe brace locked into extension throughout session. Evaluation completed (see details above) with patient education regarding purpose of PT evaluation, PT POC and goals, therapy schedule, weekly team meetings, and other CIR information including safety plan and fall risk safety. Therapist reinforced pt's education on her B LE WBing and bracing restrictions. Pt performed the below functional mobility tasks with the specified levels of assistance. Therapist providing cuing throughout for sequencing and technique to increase pt independence. Pt highly motivated to increase her independence. Therapist educated pt on likely need for ramped entrance into her home as well as need for consistent assistance at home upon D/C. Therapist also educated pt that she will need to discuss her driving restrictions with MD since she was driving herself to dialysis - pt mentioned she may be able to transition to in-home dialysis. At end of session, pt left seated in recliner with needs in reach and R LE supported on pillows.  Mobility Bed Mobility Bed Mobility: Supine to Sit;Sit to Supine Supine to Sit: Maximal Assistance - Patient - Patient 25-49% Sit to Supine: Maximal Assistance - Patient  25-49% Transfers Transfers: Lateral/Scoot Transfers (unable to safely attempt sit<>stand at this time) Lateral/Scoot Transfers: Maximal Assistance - Patient 25-49%;Moderate Assistance - Patient 50-74% (total assist for board placement) Transfer (Assistive device): Other (Comment) (slide board) Locomotion  Gait Ambulation: No Gait Gait: No Stairs / Additional Locomotion Stairs: No Wheelchair Mobility Wheelchair Mobility: Yes  Wheelchair Assistance: Chartered loss adjuster: Both upper extremities Wheelchair Parts Management: Needs assistance Distance: 142f   Discharge Criteria: Patient will be discharged from PT if patient refuses treatment 3 consecutive times without medical reason, if treatment goals not met, if there is a change in medical status, if patient makes no progress towards goals or if patient is discharged from hospital.  The above assessment, treatment plan, treatment alternatives and goals were discussed and mutually agreed upon: by patient  CTawana Scale, PT, DPT, NCS, CSRS  10/18/2021, 7:59 AM

## 2021-10-18 NOTE — Progress Notes (Signed)
[facility] Progress Note    Assessment/ Plan:   Debility - rehab per CIR Tendon rupture - RLE. Seen by orthopedics, s/p surgery 2/16, now going back to OR L hip fracture/ R pelvic ramus fx - sp L hemiarthroplasty on 09/29/21 at [facility] ESRD - on HD MWF. Continue on schedule. Next HD 2/20, no rinseback BP/ volume - BP elevated. Continue home meds.  Under dry wt, UF as tolerated.  DM2 - per pmd Anemia ckd - Last Hgb 8.9-improving. S/p transfusion. ESA started,  Aranesp 40 on 2/10.  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  Continue binders.  9. Nutrition - renal diet   Dialysis Orders: MWF [facility] [id number])  3h 75min  81kg   Hep 3000 then 800u/hr  RIJ TDC  Subjective:   No acute. Had some issues w/ hypotension on HD, she reports that her outpatient HD unit does not rinse her back bc of this issue. Otherwise no complaints. Net uf 2339cc w/ hd yday   Objective:   BP 137/61 (BP Location: Right Arm)    Pulse 93    Temp 98.1 F (36.7 C) (Oral)    Resp 16    Wt 76.3 kg    SpO2 97%    BMI 26.35 kg/m   Intake/Output Summary (Last 24 hours) at 10/18/2021 1696 Last data filed at 10/18/2021 0500 Gross per 24 hour  Intake 600 ml  Output 2339 ml  Net -1739 ml   Weight change:   Physical Exam: Gen: nad CVS:rrr Resp:normal wob VEL:FYBO Ext: brace in place, no sig edema Neuro: awake, alert Dialysis access: RIJ [facility]  Imaging: DG C-Arm 1-60 Min-No Report  Result Date: 10/16/2021 Fluoroscopy was utilized by the requesting physician.  No radiographic interpretation.   DG HIP UNILAT WITH PELVIS 2-3 VIEWS LEFT  Result Date: 10/16/2021 CLINICAL DATA:  Left hip  REPAIR QUADRICEP TENDON EXAM: DG HIP (WITH OR WITHOUT PELVIS) 2-3V LEFT COMPARISON:  X-ray left hip 10/02/2021 FINDINGS: Intraoperative repair of the quadriceps tendon per history. One low resolution intraoperative spot views of the left hip were obtained in a patient status post total left hip arthroplasty. No  fracture visible on the limited views. Total fluoroscopy time: 10.3 seconds Total radiation dose: 1.8 mGy IMPRESSION: Intraoperative repair of the quadriceps tendon in a patient status post total left hip arthroplasty. Electronically Signed   By: Iven Finn M.D.   On: 10/16/2021 17:12    Labs: BMET Recent Labs  Lab 10/12/21 0649 10/13/21 0811 10/16/21 1335 10/17/21 0139  NA 137 134* 136 134*  K 4.4 5.9* 4.4 5.2*  CL 97* 96* 100 96*  CO2 25 23  --  23  GLUCOSE 166* 199* 166* 255*  BUN 36* 50* 31* 43*  CREATININE 6.08* 7.50* 5.40* 5.91*  CALCIUM 9.3 9.2  --  9.2  PHOS 4.3 4.3  --  4.4   CBC Recent Labs  Lab 10/12/21 0649 10/13/21 0811 10/16/21 1335 10/17/21 0139  WBC 9.2 8.3  --  11.8*  NEUTROABS 7.4  --   --   --   HGB 8.3* 8.2* 10.9* 8.9*  HCT 26.3* 26.4* 32.0* 27.3*  MCV 98.9 100.8*  --  98.2  PLT 274 326  --  279    Medications:     ascorbic acid  500 mg Oral Daily   darbepoetin (ARANESP) injection - DIALYSIS  40 mcg Intravenous Q Fri-HD   docusate sodium  100 mg Oral BID   enoxaparin (LOVENOX)  injection  30 mg Subcutaneous Q24H   feeding supplement (NEPRO CARB STEADY)  237 mL Oral BID BM   insulin aspart  0-9 Units Subcutaneous TID WC   losartan  50 mg Oral Daily   multivitamin  1 tablet Oral QHS   polyethylene glycol  17 g Oral Daily      Gean Quint, MD [facility] 10/18/2021, 8:43 AM

## 2021-10-18 NOTE — Plan of Care (Signed)
°  Problem: RH Balance Goal: LTG Patient will maintain dynamic sitting balance (PT) Description: LTG:  Patient will maintain dynamic sitting balance with assistance during mobility activities (PT) Flowsheets (Taken 10/18/2021 2000) LTG: Pt will maintain dynamic sitting balance during mobility activities with:: Supervision/Verbal cueing Goal: LTG Patient will maintain dynamic standing balance (PT) Description: LTG:  Patient will maintain dynamic standing balance with assistance during mobility activities (PT) Flowsheets (Taken 10/18/2021 2000) LTG: Pt will maintain dynamic standing balance during mobility activities with:: Minimal Assistance - Patient > 75%   Problem: Sit to Stand Goal: LTG:  Patient will perform sit to stand with assistance level (PT) Description: LTG:  Patient will perform sit to stand with assistance level (PT) Flowsheets (Taken 10/18/2021 2000) LTG: PT will perform sit to stand in preparation for functional mobility with assistance level: Minimal Assistance - Patient > 75%   Problem: RH Bed Mobility Goal: LTG Patient will perform bed mobility with assist (PT) Description: LTG: Patient will perform bed mobility with assistance, with/without cues (PT). Flowsheets (Taken 10/18/2021 2000) LTG: Pt will perform bed mobility with assistance level of: Contact Guard/Touching assist   Problem: RH Bed to Chair Transfers Goal: LTG Patient will perform bed/chair transfers w/assist (PT) Description: LTG: Patient will perform bed to chair transfers with assistance (PT). Flowsheets (Taken 10/18/2021 2000) LTG: Pt will perform Bed to Chair Transfers with assistance level: Contact Guard/Touching assist   Problem: RH Car Transfers Goal: LTG Patient will perform car transfers with assist (PT) Description: LTG: Patient will perform car transfers with assistance (PT). Flowsheets (Taken 10/18/2021 2000) LTG: Pt will perform car transfers with assist:: Minimal Assistance - Patient > 75%    Problem: RH Ambulation Goal: LTG Patient will ambulate in controlled environment (PT) Description: LTG: Patient will ambulate in a controlled environment, # of feet with assistance (PT). Flowsheets (Taken 10/18/2021 2000) LTG: Pt will ambulate in controlled environ  assist needed:: Minimal Assistance - Patient > 75% LTG: Ambulation distance in controlled environment: 70ft using LRAD Goal: LTG Patient will ambulate in home environment (PT) Description: LTG: Patient will ambulate in home environment, # of feet with assistance (PT). Flowsheets (Taken 10/18/2021 2000) LTG: Pt will ambulate in home environ  assist needed:: Minimal Assistance - Patient > 75% LTG: Ambulation distance in home environment: 64ft using LRAD   Problem: RH Wheelchair Mobility Goal: LTG Patient will propel w/c in controlled environment (PT) Description: LTG: Patient will propel wheelchair in controlled environment, # of feet with assist (PT) Flowsheets (Taken 10/18/2021 2000) LTG: Pt will propel w/c in controlled environ  assist needed:: Supervision/Verbal cueing LTG: Propel w/c distance in controlled environment: 184ft Goal: LTG Patient will propel w/c in home environment (PT) Description: LTG: Patient will propel wheelchair in home environment, # of feet with assistance (PT). Flowsheets (Taken 10/18/2021 2000) LTG: Pt will propel w/c in home environ  assist needed:: Supervision/Verbal cueing LTG: Propel w/c distance in home environment: 10ft

## 2021-10-18 NOTE — Progress Notes (Signed)
Physical Therapy Session Note  Patient Details  Name: [name] MRN: [id number] Date of Birth: 1965/09/28  Today's Date: 10/18/2021 PT Individual Time: 1533-1620 PT Individual Time Calculation (min): 47 min   Short Term Goals: Week 1:  PT Short Term Goal 1 (Week 1): Pt will perform supine<>sit with mod assist of 1 consistently PT Short Term Goal 2 (Week 1): Pt will perform bed<>chair transfers using LRAD with mod assist of 1 consistently PT Short Term Goal 3 (Week 1): Pt will perform sit<>stand using LRAD with +2 mod assist  Skilled Therapeutic Interventions/Progress Updates:    Pt received sitting in recliner and reports fatigue from therapy sessions today, but despite this is agreeable to participate in this session without needing motivation. Pt wearing R LE Bledsoe brace locked into extension at all times. L lateral scoot transfer recliner>w/c using transfer board, total assist for board placement, mod assist for scooting using sheet under pt's hips - requires increased assist due to slight uphill transfer - pt does well at maintaining R LE TDWBing restrictions.  Transported to gym in w/c for time management and energy conservation.  Therapy session focused on educating patient on various ways to attempt sit>stand transfers and stair navigation with therapist demonstrating each. Sit<>stand options: in // bars from w/c or from elevated mat using Ethelene Hal vs RW Stair navigation: B HR support hopping up backwards (pt states her railings are too far apart for this), ascending forward using 1 axillary crutch and other HR , shower chair method (educated pt that this requires coming to stand from low seat height and requires her stairs to be 6" in height for chair to fit)  Therapist reinforced education that pt will most likely need a ramp for safe home entry and pt having more understanding of this, but still with an overall goal of navigating stairs without a ramp due to financial concerns of  building a ramp. Therapist also reinforced that pt will likely need 24hr assistance upon D/C, but pt reports that she does not feel she and her husband can afford for him to take FMLA financially.  B UE w/c propulsion ~166ft back to room with supervision - cuing for propulsion technique and education on brake management.  R lateral scoot transfer w/c>EOB using transfer board, total assist for board placement, heavy min assist of 1 for scooting hips via using bed sheet underneath pt's hips to decrease friction during transfer and for increased ease of assisting pt safely.  Sit>supine max assist for B LE management into bed. Pt left supported long sitting in bed with needs in reach and bed alarm on.  Therapy Documentation Precautions:  Precautions Precautions: Fall, Anterior Hip Precaution Comments: HD access port RUQ Required Braces or Orthoses: Knee Immobilizer - Right Knee Immobilizer - Right: On at all times Restrictions Weight Bearing Restrictions: Yes RLE Weight Bearing: Weight bearing as tolerated LLE Weight Bearing: Weight bearing as tolerated Other Position/Activity Restrictions: anterior hip- no hip extension, hip external rotation, or abduction   Pain: Pt reports pain in R knee - premedicated - nurse notified at end of session of pt's request for ice to place on knee - therapist provided repositioning, distraction, and rest for pain management.    Therapy/Group: Individual Therapy  Tawana Scale , PT, DPT, NCS, CSRS  10/18/2021, 7:17 PM

## 2021-10-18 NOTE — Progress Notes (Signed)
PROGRESS NOTE   Subjective/Complaints:  Pain controlled on vicodin, hopes to avoid constipation , will have Miralax this am   ROS: No CP, SOB, N/V +pain in bilateral lower extremities, iminimal postop pain in right leg  Objective:   DG C-Arm 1-60 Min-No Report  Result Date: 10/16/2021 Fluoroscopy was utilized by the requesting physician.  No radiographic interpretation.   DG HIP UNILAT WITH PELVIS 2-3 VIEWS LEFT  Result Date: 10/16/2021 CLINICAL DATA:  Left hip  REPAIR QUADRICEP TENDON EXAM: DG HIP (WITH OR WITHOUT PELVIS) 2-3V LEFT COMPARISON:  X-ray left hip 10/02/2021 FINDINGS: Intraoperative repair of the quadriceps tendon per history. One low resolution intraoperative spot views of the left hip were obtained in a patient status post total left hip arthroplasty. No fracture visible on the limited views. Total fluoroscopy time: 10.3 seconds Total radiation dose: 1.8 mGy IMPRESSION: Intraoperative repair of the quadriceps tendon in a patient status post total left hip arthroplasty. Electronically Signed   By: Iven Finn M.D.   On: 10/16/2021 17:12   Recent Labs    10/16/21 1335 10/17/21 0139  WBC  --  11.8*  HGB 10.9* 8.9*  HCT 32.0* 27.3*  PLT  --  279      Recent Labs    10/16/21 1335 10/17/21 0139  NA 136 134*  K 4.4 5.2*  CL 100 96*  CO2  --  23  GLUCOSE 166* 255*  BUN 31* 43*  CREATININE 5.40* 5.91*  CALCIUM  --  9.2       Intake/Output Summary (Last 24 hours) at 10/18/2021 0848 Last data filed at 10/18/2021 0500 Gross per 24 hour  Intake 600 ml  Output 2339 ml  Net -1739 ml          Physical Exam: Vital Signs Blood pressure 137/61, pulse 93, temperature 98.1 F (36.7 C), temperature source Oral, resp. rate 16, weight 76.3 kg, SpO2 97 %.   General: No acute distress Mood and affect are appropriate Heart: Regular rate and rhythm no rubs murmurs or extra sounds Lungs: Clear to  auscultation, breathing unlabored, no rales or wheezes Abdomen: Positive bowel sounds, soft nontender to palpation, nondistended Extremities: No clubbing, cyanosis, or edema Skin: No evidence of breakdown, no evidence of rash   Musculoskeletal:        General: Swelling and tenderness present.     Cervical back: Normal range of motion and neck supple.     Right lower leg: Edema present.     Left lower leg: Edema present.     Comments: Left hip tender to palpation and PROM with associated swelling. Left knee with mild effusion, patella laxity. Right hip tender with leg raise  Active range of motion by patient with good improvements. Right knee in immobilizer.  Right knee tender and swollen Able to get out of bed today Skin:    Comments: Incision site dressing clean dry and intact.  R IJ TDC with bandage Neurological:     Comments: Patient is alert.  No acute distress.  Oriented x3 and follows commands. Alert and oriented x 3. Normal insight and awareness. Intact Memory. Normal language and speech. Cranial nerve exam unremarkable. UE motor  5/5. LE motor limited proximally by pain. ADF/PF 4-5/5. No sensory findings.   Psychiatric:     Comments: Pleasant, slightly anxious  GU: stool incontinence in diaper   Assessment/Plan: 1. Functional deficits which require 3+ hours per day of interdisciplinary therapy in a comprehensive inpatient rehab setting. Physiatrist is providing close team supervision and 24 hour management of active medical problems listed below. Physiatrist and rehab team continue to assess barriers to discharge/monitor patient progress toward functional and medical goals  Care Tool:  Bathing              Bathing assist       Upper Body Dressing/Undressing Upper body dressing        Upper body assist      Lower Body Dressing/Undressing Lower body dressing            Lower body assist       Toileting Toileting    Toileting assist        Transfers Chair/bed transfer  Transfers assist           Locomotion Ambulation   Ambulation assist              Walk 10 feet activity   Assist           Walk 50 feet activity   Assist           Walk 150 feet activity   Assist           Walk 10 feet on uneven surface  activity   Assist           Wheelchair     Assist               Wheelchair 50 feet with 2 turns activity    Assist            Wheelchair 150 feet activity     Assist          Blood pressure 137/61, pulse 93, temperature 98.1 F (36.7 C), temperature source Oral, resp. rate 16, weight 76.3 kg, SpO2 97 %.    Medical Problem List and Plan: 1. Functional deficits secondary to nondisplaced fracture right inferior pubic ramus as well as a displaced foreshortened fracture through the left femoral neck/intertrochanteric area.  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   -Weightbearing as tolerated with anterior total hip precautions.                                  -Conservative care right inferior pubic ramus fracture, weightbearing as tolerated             -patient may  shower             -ELOS/Goals: 10-14 days, supervision to min assist goals  Discussed with team plan for surgery on Thursday. NPO at midnight on Wednesday  Interrupted stay- restart rehab today after Right quad tendon tear repair   2.  Antithrombotics: -DVT/anticoagulation:  Mechanical: Antiembolism stockings, thigh (TED hose) Bilateral lower extremities check vascular study.   -should be able to begin lovenox today as permacath placed            -antiplatelet therapy: N/A 3. Femur fracture pain: continue Flector patch,Robaxin 750 mg 4 times daily, hydrocodone and oxycodone as needed             -pt having significant spasms in left thigh.             -  add kpad to help with spasms  -schedule tylenol 1000mg  TID 4. Anxiety: Instructed on deep breathing techniques.  Provide emotional support.  Melatonin as needed             -antipsychotic agents: N/A 5. Neuropsych: This patient is capable of making decisions on her own behalf. 6. Skin/Wound Care: Routine skin checks 7. Fluids/Electrolytes/Nutrition: Routine in and outs with follow-up chemistries 8.  End-stage renal disease/hemodialysis.  Permacath exchanged 10/03/2021.    -  Follow-up hemodialysis per Dr. [name] -HD later in day to allow participation in therapies during the day 9.  Acute on chronic anemia.  Has had transfusions already x 2. Hgb has stabilized in 7 range. Continue iron supplement.              -epo per nephrology 10.  Diabetes mellitus with peripheral neuropathy. Placed order that she may use her Dexcom 6. Hemoglobin A1c 6.2.  SSI as prior to admission. CBG (last 3)  Recent Labs    10/17/21 1214 10/17/21 2116 10/18/21 0600  GLUCAP 165* 265* 173*  Some lability will monitor   11.  Hypertension. Continue cozaar Vitals:   10/17/21 1936 10/18/21 0359  BP: (!) 134/59 137/61  Pulse: 99 93  Resp: 16 16  Temp: 98.5 F (36.9 C) 98.1 F (36.7 C)  SpO2: 98% 97%  Controlled 2/18  12.  Hyperparathyroidism of renal origin.  Plan outpatient parathyroidectomy per Dr.Lateef 13.  Overweight.  BMI 29.11.  Dietary follow-up. Provided list of foods that can help with weight loss.  14. Cushingoid?--outpt work up 51. Constipation: resolved. Decrease colace to daily prn. D/c senna. Discussed benefits of high fiber foods 16. Right quadriceps tear: surgery complete with good healing on XR, minimal postop pain, return to rehab today, discussed importance of participating in 3 hours therapy daily as much as she can tolerate and she is very motivated and excited for rehab 17. Gas: continue simethicone 18. Ileus: resolved, regular diet resumed -tolerating.  19. Abd spasms: resolved 20. Black tarry stools: discussed heme occult negative last night. Hgb reviewed and stable.     LOS: 1 days A FACE  TO FACE EVALUATION WAS PERFORMED  Charlett Blake 10/18/2021, 8:48 AM

## 2021-10-18 NOTE — Progress Notes (Signed)
Patient ID: [name], female   DOB: 13-Aug-1966, 56 y.o.   MRN: [id number] Follow up with the patient regarding rehab schedule, team conference and plan of care. Patient updated on WB precautions, maintain dressing to right LE x 5 days then remove, maintain KI, WBAT on left LE. Continue to follow along to discharge to address educational needs and collaborate with the team and patient to facilitate preparation for discharge.Margarito Liner

## 2021-10-18 NOTE — Plan of Care (Signed)
°  Problem: RH Balance Goal: LTG: Patient will maintain dynamic sitting balance (OT) Description: LTG:  Patient will maintain dynamic sitting balance with assistance during activities of daily living (OT) Flowsheets (Taken 10/18/2021 1252) LTG: Pt will maintain dynamic sitting balance during ADLs with: (during transfers) Contact Guard/Touching assist   Problem: RH Grooming Goal: LTG Patient will perform grooming w/assist,cues/equip (OT) Description: LTG: Patient will perform grooming with assist, with/without cues using equipment (OT) Flowsheets (Taken 10/18/2021 1252) LTG: Pt will perform grooming with assistance level of: Independent with assistive device    Problem: RH Bathing Goal: LTG Patient will bathe all body parts with assist levels (OT) Description: LTG: Patient will bathe all body parts with assist levels (OT) Flowsheets (Taken 10/18/2021 1252) LTG: Pt will perform bathing with assistance level/cueing: Supervision/Verbal cueing   Problem: RH Dressing Goal: LTG Patient will perform upper body dressing (OT) Description: LTG Patient will perform upper body dressing with assist, with/without cues (OT). Flowsheets (Taken 10/18/2021 1252) LTG: Pt will perform upper body dressing with assistance level of: Independent with assistive device Goal: LTG Patient will perform lower body dressing w/assist (OT) Description: LTG: Patient will perform lower body dressing with assist, with/without cues in positioning using equipment (OT) Flowsheets (Taken 10/18/2021 1252) LTG: Pt will perform lower body dressing with assistance level of: Supervision/Verbal cueing   Problem: RH Toileting Goal: LTG Patient will perform toileting task (3/3 steps) with assistance level (OT) Description: LTG: Patient will perform toileting task (3/3 steps) with assistance level (OT)  Flowsheets (Taken 10/18/2021 1252) LTG: Pt will perform toileting task (3/3 steps) with assistance level: Supervision/Verbal cueing    Problem: RH Toilet Transfers Goal: LTG Patient will perform toilet transfers w/assist (OT) Description: LTG: Patient will perform toilet transfers with assist, with/without cues using equipment (OT) Flowsheets (Taken 10/18/2021 1252) LTG: Pt will perform toilet transfers with assistance level of: Contact Guard/Touching assist

## 2021-10-19 ENCOUNTER — Inpatient Hospital Stay (HOSPITAL_COMMUNITY): Payer: Medicare Other

## 2021-10-19 DIAGNOSIS — R609 Edema, unspecified: Secondary | ICD-10-CM

## 2021-10-19 LAB — GLUCOSE, CAPILLARY
Glucose-Capillary: 199 mg/dL — ABNORMAL HIGH (ref 70–99)
Glucose-Capillary: 201 mg/dL — ABNORMAL HIGH (ref 70–99)
Glucose-Capillary: 183 mg/dL — ABNORMAL HIGH (ref 70–99)
Glucose-Capillary: 239 mg/dL — ABNORMAL HIGH (ref 70–99)

## 2021-10-19 NOTE — Progress Notes (Signed)
Left lower extremity venous duplex completed. Refer to "CV Proc" under chart review to view preliminary results.  10/19/2021 9:55 AM Kelby Aline., MHA, RVT, RDCS, RDMS

## 2021-10-19 NOTE — Progress Notes (Addendum)
°  [facility] Progress Note    Assessment/ Plan:   Debility - rehab per CIR Tendon rupture - RLE. Seen by orthopedics, s/p surgery 2/16, back in CIR L hip fracture/ R pelvic ramus fx - sp L hemiarthroplasty on 09/29/21 at [facility] - on HD MWF. Continue on schedule. Next HD 2/20, no rinseback BP/ volume - BP acceptable. Continue home meds.  Under dry wt, UF as tolerated. will need new edw upon d/c DM2 - per pmd Anemia ckd - Last Hgb 8.9 on 2/17-improving. S/p transfusion. ESA started,  Aranesp 40 on 2/10.  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  Continue binders.  9. Nutrition - renal diet   Dialysis Orders: MWF [facility] [id number])  3h 50min  81kg   Hep 3000 then 800u/hr  RIJ TDC  Subjective:   No acute events/new complaints.   Objective:   BP (!) 138/54 (BP Location: Right Arm)    Pulse 91    Temp 98 F (36.7 C) (Oral)    Resp 18    Wt 76.3 kg    SpO2 97%    BMI 26.35 kg/m   Intake/Output Summary (Last 24 hours) at 10/19/2021 0801 Last data filed at 10/18/2021 2300 Gross per 24 hour  Intake 360 ml  Output --  Net 360 ml   Weight change:   Physical Exam: Gen: nad CVS:rrr Resp:normal wob QJJ:HERD Ext: rt knee immobilizer in place, no sig edema Neuro: awake, alert Dialysis access: RIJ TDC  Imaging: No results found.  Labs: BMET Recent Labs  Lab 10/13/21 0811 10/16/21 1335 10/17/21 0139  NA 134* 136 134*  K 5.9* 4.4 5.2*  CL 96* 100 96*  CO2 23  --  23  GLUCOSE 199* 166* 255*  BUN 50* 31* 43*  CREATININE 7.50* 5.40* 5.91*  CALCIUM 9.2  --  9.2  PHOS 4.3  --  4.4   CBC Recent Labs  Lab 10/13/21 0811 10/16/21 1335 10/17/21 0139  WBC 8.3  --  11.8*  HGB 8.2* 10.9* 8.9*  HCT 26.4* 32.0* 27.3*  MCV 100.8*  --  98.2  PLT 326  --  279    Medications:     ascorbic acid  500 mg Oral Daily   Chlorhexidine Gluconate Cloth  6 each Topical Q0600   darbepoetin (ARANESP) injection - DIALYSIS  40 mcg Intravenous Q Fri-HD   docusate  sodium  100 mg Oral BID   enoxaparin (LOVENOX) injection  30 mg Subcutaneous Q24H   feeding supplement (NEPRO CARB STEADY)  237 mL Oral BID BM   insulin aspart  0-9 Units Subcutaneous TID WC   losartan  50 mg Oral Daily   multivitamin  1 tablet Oral QHS   polyethylene glycol  17 g Oral Daily      Gean Quint, MD [facility] 10/19/2021, 8:01 AM

## 2021-10-20 ENCOUNTER — Encounter (HOSPITAL_COMMUNITY): Payer: Self-pay | Admitting: Physical Medicine and Rehabilitation

## 2021-10-20 LAB — CBC
HCT: 27.2 % — ABNORMAL LOW (ref 36.0–46.0)
Hemoglobin: 8.6 g/dL — ABNORMAL LOW (ref 12.0–15.0)
MCH: 31.7 pg (ref 26.0–34.0)
MCHC: 31.6 g/dL (ref 30.0–36.0)
MCV: 100.4 fL — ABNORMAL HIGH (ref 80.0–100.0)
Platelets: 248 10*3/uL (ref 150–400)
RBC: 2.71 MIL/uL — ABNORMAL LOW (ref 3.87–5.11)
RDW: 15.9 % — ABNORMAL HIGH (ref 11.5–15.5)
WBC: 8.3 10*3/uL (ref 4.0–10.5)
nRBC: 0 % (ref 0.0–0.2)

## 2021-10-20 LAB — RENAL FUNCTION PANEL
Albumin: 3.1 g/dL — ABNORMAL LOW (ref 3.5–5.0)
Anion gap: 16 — ABNORMAL HIGH (ref 5–15)
BUN: 68 mg/dL — ABNORMAL HIGH (ref 6–20)
CO2: 23 mmol/L (ref 22–32)
Calcium: 9 mg/dL (ref 8.9–10.3)
Chloride: 95 mmol/L — ABNORMAL LOW (ref 98–111)
Creatinine, Ser: 7.97 mg/dL — ABNORMAL HIGH (ref 0.44–1.00)
GFR, Estimated: 5 mL/min — ABNORMAL LOW (ref >60)
Glucose, Bld: 245 mg/dL — ABNORMAL HIGH (ref 70–99)
Phosphorus: 4.9 mg/dL — ABNORMAL HIGH (ref 2.5–4.6)
Potassium: 4.8 mmol/L (ref 3.5–5.1)
Sodium: 134 mmol/L — ABNORMAL LOW (ref 135–145)

## 2021-10-20 LAB — GLUCOSE, CAPILLARY
Glucose-Capillary: 213 mg/dL — ABNORMAL HIGH (ref 70–99)
Glucose-Capillary: 247 mg/dL — ABNORMAL HIGH (ref 70–99)
Glucose-Capillary: 184 mg/dL — ABNORMAL HIGH (ref 70–99)
Glucose-Capillary: 336 mg/dL — ABNORMAL HIGH (ref 70–99)

## 2021-10-20 MED ORDER — HEPARIN SODIUM (PORCINE) 1000 UNIT/ML IJ SOLN
INTRAMUSCULAR | Status: AC
  Filled 2021-10-20: qty 1

## 2021-10-20 MED ORDER — VITAMIN D (ERGOCALCIFEROL) 1.25 MG (50000 UNIT) PO CAPS
50000.0000 [IU] | ORAL_CAPSULE | ORAL | Status: DC
Start: 2021-10-20 — End: 2021-11-08
  Administered 2021-10-20 – 2021-11-03 (×2): 50000 [IU] via ORAL
  Filled 2021-10-20 (×2): qty 1

## 2021-10-20 NOTE — Progress Notes (Signed)
PROGRESS NOTE   Subjective/Complaints: Pain currently well controlled Hopes to stand with [name] today Moving bowels after taking Ex-Lax this weekend No evidence of clot on ultrasound  ROS: No CP, SOB, N/V +pain in bilateral lower extremities, post-op pain in right lower extremity improved  Objective:   VAS [place] LOWER EXTREMITY VENOUS (DVT)  Result Date: 10/19/2021  Lower Venous DVT Study Patient Name:  [name]  Date of Exam:   10/19/2021 Medical Rec #: [id number]     Accession #:    [id number] Date of Birth: Aug 17, 1966     Patient Gender: F Patient Age:   56 years Exam Location:  [facility] Procedure:      VAS [place] LOWER EXTREMITY VENOUS (DVT) Referring Phys: Lauraine Rinne --------------------------------------------------------------------------------  Indications: Edema.  Limitations: Extensive right lower extremity bandaging and immobilizer. Comparison Study: No prior study Performing Technologist: Maudry Mayhew MHA, RDMS, RVT, RDCS  Examination Guidelines: A complete evaluation includes B-mode imaging, spectral Doppler, color Doppler, and power Doppler as needed of all accessible portions of each vessel. Bilateral testing is considered an integral part of a complete examination. Limited examinations for reoccurring indications may be performed as noted. The reflux portion of the exam is performed with the patient in reverse Trendelenburg.  Right Technical Findings: Not visualized segments include CFV.  +---------+---------------+---------+-----------+----------+--------------+  LEFT      Compressibility Phasicity Spontaneity Properties Thrombus Aging  +---------+---------------+---------+-----------+----------+--------------+  CFV       Full            Yes       Yes                                    +---------+---------------+---------+-----------+----------+--------------+  SFJ       Full                                                              +---------+---------------+---------+-----------+----------+--------------+  FV Prox   Full                                                             +---------+---------------+---------+-----------+----------+--------------+  FV Mid    Full                                                             +---------+---------------+---------+-----------+----------+--------------+  FV Distal Full                                                             +---------+---------------+---------+-----------+----------+--------------+  PFV       Full                                                             +---------+---------------+---------+-----------+----------+--------------+  POP       Full            Yes       Yes                                    +---------+---------------+---------+-----------+----------+--------------+  PTV       Full                                                             +---------+---------------+---------+-----------+----------+--------------+  PERO      Full                                                             +---------+---------------+---------+-----------+----------+--------------+     Summary: LEFT: - There is no evidence of deep vein thrombosis in the lower extremity.  - No cystic structure found in the popliteal fossa.  *See table(s) above for measurements and observations. Electronically signed by Orlie Pollen on 10/19/2021 at 11:35:36 AM.    Final    No results for input(s): WBC, HGB, HCT, PLT in the last 72 hours.   No results for input(s): NA, K, CL, CO2, GLUCOSE, BUN, CREATININE, CALCIUM in the last 72 hours.    Intake/Output Summary (Last 24 hours) at 10/20/2021 1250 Last data filed at 10/20/2021 0900 Gross per 24 hour  Intake 720 ml  Output --  Net 720 ml         Physical Exam: Vital Signs Blood pressure (!) 150/64, pulse 93, temperature 98.8 F (37.1 C), temperature source Oral, resp. rate 19, weight 76.3 kg, SpO2 99  %. Gen: no distress, normal appearing HEENT: oral mucosa pink and moist, NCAT Cardio: Reg rate Chest: normal effort, normal rate of breathing Abd: soft, non-distended Ext: no edema Psych: pleasant, normal affect Musculoskeletal:        General: Swelling and tenderness present.     Cervical back: Normal range of motion and neck supple.     Right lower leg: Edema present.     Left lower leg: Edema present.     Comments: Left hip tender to palpation and PROM with associated swelling. Left knee with mild effusion, patella laxity. Right hip tender with leg raise  Active range of motion by patient with good improvements. Right knee in immobilizer.  Right knee tender and swollen Able to get out of bed today Skin:    Comments: Incision site dressing clean dry and intact.  R IJ TDC with bandage Neurological:     Comments: Patient is alert.  No acute distress.  Oriented x3 and follows commands. Alert and oriented x 3. Normal  insight and awareness. Intact Memory. Normal language and speech. Cranial nerve exam unremarkable. UE motor 5/5. LE motor limited proximally by pain. ADF/PF 4-5/5. No sensory findings.   Psychiatric:     Comments: Pleasant, slightly anxious  GU: stool incontinence in diaper   Assessment/Plan: 1. Functional deficits which require 3+ hours per day of interdisciplinary therapy in a comprehensive inpatient rehab setting. Physiatrist is providing close team supervision and 24 hour management of active medical problems listed below. Physiatrist and rehab team continue to assess barriers to discharge/monitor patient progress toward functional and medical goals  Care Tool:  Bathing    Body parts bathed by patient: Face   Body parts bathed by helper: Left lower leg Body parts n/a: Right lower leg   Bathing assist Assist Level: Set up assist     Upper Body Dressing/Undressing Upper body dressing   What is the patient wearing?: Dress    Upper body assist Assist Level:  Set up assist    Lower Body Dressing/Undressing Lower body dressing      What is the patient wearing?: Incontinence brief     Lower body assist Assist for lower body dressing: Moderate Assistance - Patient 50 - 74%     Toileting Toileting Toileting Activity did not occur (Clothing management and hygiene only): N/A (no void or bm)  Toileting assist       Transfers Chair/bed transfer  Transfers assist     Chair/bed transfer assist level: Moderate Assistance - Patient 50 - 74% Chair/bed transfer assistive device: Sliding board (slideboard)   Locomotion Ambulation   Ambulation assist   Ambulation activity did not occur: Safety/medical concerns          Walk 10 feet activity   Assist  Walk 10 feet activity did not occur: Safety/medical concerns        Walk 50 feet activity   Assist Walk 50 feet with 2 turns activity did not occur: Safety/medical concerns         Walk 150 feet activity   Assist Walk 150 feet activity did not occur: Safety/medical concerns         Walk 10 feet on uneven surface  activity   Assist Walk 10 feet on uneven surfaces activity did not occur: Safety/medical concerns         Wheelchair     Assist Is the patient using a wheelchair?: Yes Type of Wheelchair: Manual    Wheelchair assist level: Supervision/Verbal cueing, Set up assist Max wheelchair distance: 188ft    Wheelchair 50 feet with 2 turns activity    Assist        Assist Level: Supervision/Verbal cueing   Wheelchair 150 feet activity     Assist      Assist Level: Supervision/Verbal cueing   Blood pressure (!) 150/64, pulse 93, temperature 98.8 F (37.1 C), temperature source Oral, resp. rate 19, weight 76.3 kg, SpO2 99 %.    Medical Problem List and Plan: 1. Functional deficits secondary to nondisplaced fracture right inferior pubic ramus as well as a displaced foreshortened fracture through the left femoral  neck/intertrochanteric area.  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   -Weightbearing as tolerated with anterior total hip precautions.                                  -Conservative care right inferior pubic ramus fracture, weightbearing as tolerated             -  patient may  shower             -ELOS/Goals: 3-4 weeks total, supervision to min assist goals  Discussed with team plan for surgery on Thursday. NPO at midnight on Wednesday  Continue CIR  2.  Antithrombotics: -DVT/anticoagulation:  Mechanical: Antiembolism stockings, thigh (TED hose) Bilateral lower extremities check vascular study.   -should be able to begin lovenox today as permacath placed            -antiplatelet therapy: N/A 3. Femur fracture pain: continue Flector patch,Robaxin 750 mg 4 times daily, hydrocodone and oxycodone as needed             -pt having significant spasms in left thigh.             -add kpad to help with spasms  -schedule tylenol 1000mg  TID 4. Anxiety: Instructed on deep breathing techniques. Provide emotional support.  Melatonin as needed             -antipsychotic agents: N/A 5. Neuropsych: This patient is capable of making decisions on her own behalf. 6. Skin/Wound Care: Routine skin checks 7. Fluids/Electrolytes/Nutrition: Routine in and outs with follow-up chemistries 8.  End-stage renal disease/hemodialysis.  Permacath exchanged 10/03/2021.    -  Follow-up hemodialysis per Dr. [name] -HD later in day to allow participation in therapies during the day 9.  Acute on chronic anemia.  Has had transfusions already x 2. Hgb has stabilized in 7 range. Continue iron supplement.              -epo per nephrology 10.  Diabetes mellitus with peripheral neuropathy. Placed order that she may use her Dexcom 6. Hemoglobin A1c 6.2.  SSI as prior to admission. Provide dietary education.  CBG (last 3)  Recent Labs    10/19/21 2112 10/20/21 0613 10/20/21 1207  GLUCAP 239* 213* 247*  Some lability  will monitor   11.  Hypertension. Continue cozaar Vitals:   10/20/21 0349 10/20/21 1240  BP: (!) 157/77 (!) 150/64  Pulse: 93 93  Resp: 18 19  Temp: 98.3 F (36.8 C) 98.8 F (37.1 C)  SpO2: 99% 99%  Controlled 2/18  12.  Hyperparathyroidism of renal origin.  Plan outpatient parathyroidectomy per Dr.Lateef 13.  Overweight.  BMI 29.11.  Dietary follow-up. Provided list of foods that can help with weight loss.  14. Cushingoid?--outpt work up 39. Constipation: resolved. Decrease colace to daily prn. D/c senna. Discussed benefits of high fiber foods. Continue bowel regimen.  16. Right quadriceps tear: surgery complete with good healing on XR, minimal postop pain, return to rehab today, discussed importance of participating in 3 hours therapy daily as much as she can tolerate and she is very motivated and excited for rehab 17. Gas: continue simethicone 18. Ileus: resolved, regular diet resumed -tolerating.  19. Abd spasms: resolved 20. Black tarry stools: discussed heme occult negative last night. Hgb reviewed and stable.  21. Vitamin D deficiency: start ergocalciferol 50,000U once per week for 7 weeks    LOS: 3 days A FACE TO FACE EVALUATION WAS PERFORMED  [name] 10/20/2021, 12:50 PM

## 2021-10-20 NOTE — Progress Notes (Signed)
[name] Progress Note    Assessment/ Plan:   Debility - rehab per CIR RLE tendon rupture - s/p surgery 2/16 L hip fracture/ R pelvic ramus fx - sp L hemiarthroplasty on 09/29/21 at [facility], then sent here for CIR ESRD - on HD MWF. Continue on schedule. Next HD today.  BP/ volume - BP acceptable. Continue home meds.  Under dry wt, UF as tolerated. Will need new edw upon d/c DM2 - per pmd Anemia ckd - Last Hgb 8.9 on 2/17-improving. S/p transfusion. ESA started,  Aranesp 40 on 2/10.  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  Continue binders.  9. Nutrition - renal diet  [name], MD 10/20/2021, 2:42 PM      Dialysis Orders: MWF [facility] [phone])  3h 31min  81kg   Hep 3000 then 800u/hr  RIJ TDC  Subjective:   No acute events/new complaints.   Objective:   BP (!) 162/75    Pulse 90    Temp 98.7 F (37.1 C) (Oral)    Resp 19    Wt 76.3 kg    SpO2 99%    BMI 26.35 kg/m   Intake/Output Summary (Last 24 hours) at 10/20/2021 1442 Last data filed at 10/20/2021 0900 Gross per 24 hour  Intake 480 ml  Output --  Net 480 ml    Weight change:   Physical Exam: Gen: nad CVS:rrr Resp:normal wob FAO:ZHYQ Ext: rt knee immobilizer in place, no sig edema Neuro: awake, alert Dialysis access: RIJ [facility]  Imaging: VAS [place] LOWER EXTREMITY VENOUS (DVT)  Result Date: 10/19/2021  Lower Venous DVT Study Patient Name:  [name]  Date of Exam:   10/19/2021 Medical Rec #: [id number]     Accession #:    [id number] Date of Birth: 1965/11/06     Patient Gender: F Patient Age:   56 years Exam Location:  [facility] Procedure:      VAS [place] LOWER EXTREMITY VENOUS (DVT) Referring Phys: Lauraine Rinne --------------------------------------------------------------------------------  Indications: Edema.  Limitations: Extensive right lower extremity bandaging and immobilizer. Comparison Study: No prior study Performing Technologist: Maudry Mayhew MHA, RDMS, RVT, RDCS   Examination Guidelines: A complete evaluation includes B-mode imaging, spectral Doppler, color Doppler, and power Doppler as needed of all accessible portions of each vessel. Bilateral testing is considered an integral part of a complete examination. Limited examinations for reoccurring indications may be performed as noted. The reflux portion of the exam is performed with the patient in reverse Trendelenburg.  Right Technical Findings: Not visualized segments include CFV.  +---------+---------------+---------+-----------+----------+--------------+  LEFT      Compressibility Phasicity Spontaneity Properties Thrombus Aging  +---------+---------------+---------+-----------+----------+--------------+  CFV       Full            Yes       Yes                                    +---------+---------------+---------+-----------+----------+--------------+  SFJ       Full                                                             +---------+---------------+---------+-----------+----------+--------------+  FV Prox   Full                                                             +---------+---------------+---------+-----------+----------+--------------+  FV Mid    Full                                                             +---------+---------------+---------+-----------+----------+--------------+  FV Distal Full                                                             +---------+---------------+---------+-----------+----------+--------------+  PFV       Full                                                             +---------+---------------+---------+-----------+----------+--------------+  POP       Full            Yes       Yes                                    +---------+---------------+---------+-----------+----------+--------------+  PTV       Full                                                             +---------+---------------+---------+-----------+----------+--------------+  PERO      Full                                                              +---------+---------------+---------+-----------+----------+--------------+     Summary: LEFT: - There is no evidence of deep vein thrombosis in the lower extremity.  - No cystic structure found in the popliteal fossa.  *See table(s) above for measurements and observations. Electronically signed by Orlie Pollen on 10/19/2021 at 11:35:36 AM.    Final     Labs: BMET Recent Labs  Lab 10/16/21 1335 10/17/21 0139  NA 136 134*  K 4.4 5.2*  CL 100 96*  CO2  --  23  GLUCOSE 166* 255*  BUN 31* 43*  CREATININE 5.40* 5.91*  CALCIUM  --  9.2  PHOS  --  4.4    CBC Recent Labs  Lab 10/16/21 1335 10/17/21 0139  WBC  --  11.8*  HGB 10.9* 8.9*  HCT 32.0* 27.3*  MCV  --  98.2  PLT  --  279     Medications:     ascorbic acid  500 mg Oral Daily   Chlorhexidine Gluconate Cloth  6 each Topical Q0600   darbepoetin (ARANESP) injection - DIALYSIS  40 mcg Intravenous Q Fri-HD   docusate sodium  100 mg Oral BID  enoxaparin (LOVENOX) injection  30 mg Subcutaneous Q24H   feeding supplement (NEPRO CARB STEADY)  237 mL Oral BID BM   insulin aspart  0-9 Units Subcutaneous TID WC   losartan  50 mg Oral Daily   multivitamin  1 tablet Oral QHS   polyethylene glycol  17 g Oral Daily   Vitamin D (Ergocalciferol)  50,000 Units Oral Q7 days

## 2021-10-20 NOTE — Progress Notes (Signed)
Occupational Therapy Session Note  Patient Details  Name: [name] MRN: [id number] Date of Birth: July 03, 1966  Today's Date: 10/20/2021 OT Individual Time: 3716-9678 OT Individual Time Calculation (min): 72 min    Short Term Goals: Week 1:  OT Short Term Goal 1 (Week 1): Pt will complete LB dressing with Mod A using AE as needed OT Short Term Goal 2 (Week 1): Pt will complete BSC transfer with 1 assist using LRAD to promote OOB toileting OT Short Term Goal 3 (Week 1): Pt will complete 1/3 components of toileting with supervision  Skilled Therapeutic Interventions/Progress Updates:  Skilled OT intervention completed with focus on self-care, home management education, functional transfers, AE education. Pt received upright in bed, agreeable to session. Pt completed bed mobility with supervision at slow pace, then slideboard transfer from EOB > w/c with total A for board placement, and Mod A for sliding with cues needed for hand placement to prevent finger impingement and for efficiency of transfer. Pt with good precaution adherence in RLE during transfer. Seated at sink, pt able to complete UB bathing, grooming and cosmetic tasks with set up A. Discussed pt's bathroom environment with DME that might be needed due to inaccessibility depending on pt's ambulatory level at d/c. Pt self-propelled in w/c for BUE endurance from room <> day room with supervision. Education provided to pt on AE including reacher for donning of LB clothing with demonstration provided, with pt stating "my husband will do all that for me." Therapist educated on rehab goals, due to pts PLOF, as well as increasing her independence for functional endurance, with therapist encouraging pt to complete LB dressing EOB vs rolling in bed next session. Pt left seated in w/c, with chair alarm on and all needs in reach at end of session.   Therapy Documentation Precautions:  Precautions Precautions: Fall, Anterior Hip, Other  (comment) Precaution Comments: HD access port RUQ, L LE anterior hip precautions, R LE rehab protocol in pt's paper chart Required Braces or Orthoses: Knee Immobilizer - Right Knee Immobilizer - Right: On at all times, Other (comment) (locked into extension) Restrictions Weight Bearing Restrictions: Yes RLE Weight Bearing: Weight bearing as tolerated LLE Weight Bearing: Weight bearing as tolerated Other Position/Activity Restrictions: L LE anterior hip precautions - no hip extension, hip external rotation, or abduction  Pain: No c/o pain   Therapy/Group: Individual Therapy  [place] Scheiderer E Gerene Nedd 10/20/2021, 7:28 AM

## 2021-10-20 NOTE — IPOC Note (Signed)
Overall Plan of Care [facility]) Patient Details Name: [name] MRN: [id number] DOB: 1966/05/14  Admitting Diagnosis: Intertrochanteric fracture of left hip [facility])  Hospital Problems: Principal Problem:   Intertrochanteric fracture of left hip ([place])     Functional Problem List: Nursing Medication Management, Safety, Pain, Bowel, Endurance  PT Balance, Perception, Behavior, Safety, Edema, Sensory, Endurance, Skin Integrity, Motor, Nutrition, Pain  OT Balance, Edema, Motor, Pain, Skin Integrity  SLP    TR         Basic ADLs: OT Grooming, Bathing, Dressing, Toileting     Advanced  ADLs: OT Simple Meal Preparation     Transfers: PT Bed Mobility, Bed to Chair, Car, Manufacturing systems engineer, Metallurgist: PT Ambulation, Emergency planning/management officer, Stairs     Additional Impairments: OT None  SLP        TR      Anticipated Outcomes Item Anticipated Outcome  Self Feeding no goal  Swallowing      Basic self-care  Supervision  Toileting  Supervision   Bathroom Transfers CGA  Bowel/Bladder  manage bowel w mod I assist  Transfers  CGA using LRAD  Locomotion  min assist limited distances using LRAD  Communication     Cognition     Pain  pain at or below level 4 with prn meds  Safety/Judgment  maintain safety w cues   Therapy Plan: PT Intensity: Minimum of 1-2 x/day ,45 to 90 minutes PT Frequency: 5 out of 7 days PT Duration Estimated Length of Stay: ~ 3 weeks OT Intensity: Minimum of 1-2 x/day, 45 to 90 minutes OT Frequency: 5 out of 7 days OT Duration/Estimated Length of Stay: 3 weeks     Due to the current state of emergency, patients may not be receiving their 3-hours of Medicare-mandated therapy.   Team Interventions: Nursing Interventions Disease Management/Prevention, Medication Management, Discharge Planning, Pain Management, Bowel Management, Patient/Family Education  PT interventions Ambulation/gait training, Discharge planning, Functional  mobility training, Psychosocial support, Therapeutic Activities, Balance/vestibular training, Disease management/prevention, Neuromuscular re-education, Skin care/wound management, Therapeutic Exercise, Wheelchair propulsion/positioning, Cognitive remediation/compensation, DME/adaptive equipment instruction, Pain management, Splinting/orthotics, UE/LE Strength taining/ROM, Community reintegration, Barrister's clerk education, IT trainer, UE/LE Coordination activities  OT Interventions Training and development officer, Engineer, drilling, Patient/family education, Therapeutic Activities, Psychosocial support, Therapeutic Exercise, Functional mobility training, Self Care/advanced ADL retraining, UE/LE Strength taining/ROM, Discharge planning, Pain management, UE/LE Coordination activities  SLP Interventions    TR Interventions    SW/CM Interventions     Barriers to Discharge MD  Medical stability  Nursing Decreased caregiver support, Hemodialysis 1 level 5ste w spouse; Home HD M-W-F  PT Inaccessible home environment, Decreased caregiver support, Home environment access/layout, Wound Care, Hemodialysis, Other (comments) 5 STE home wiht B HRs, only PRN support at D/C  OT Decreased caregiver support    SLP      SW       Team Discharge Planning: Destination: PT-Home ,OT- Home , SLP-  Projected Follow-up: PT-Home health PT, 24 hour supervision/assistance, OT-  Home health OT, SLP-  Projected Equipment Needs: PT-To be determined, OT- To be determined, SLP-  Equipment Details: PT-has rollator and axillary crutches, OT-  Patient/family involved in discharge planning: PT- Patient,  OT-Patient, SLP-   MD ELOS: 3-4 weeks Medical Rehab Prognosis:  Excellent Assessment: Mrs. [name] is a 56 year old woman admitted to CIR with left hip intertrochanteric fracture. Course complicated by right quad tendon rupture. Medications are being managed, and labs and vitals are being monitored regularly.  See Team Conference Notes for weekly updates to the plan of care

## 2021-10-20 NOTE — Progress Notes (Signed)
Inpatient Rehabilitation Care Coordinator Assessment and Plan Patient Details  Name: [name] MRN: [id number] Date of Birth: February 06, 1966  Today's Date: 10/20/2021  Hospital Problems: Principal Problem:   Intertrochanteric fracture of left hip [facility])  Past Medical History:  Past Medical History:  Diagnosis Date   Anemia    vitamin d3 deficiency   Anxiety    Chronic kidney disease    End Stage Renal Disease   Diabetes mellitus without complication (HCC)    GERD (gastroesophageal reflux disease)    nothing over last few years   High serum parathyroid hormone (PTH)    checked through Dialysis   History of kidney stones 2000   Hypertension    Neuromuscular disorder (HCC)    neuropathy in feet   PONV (postoperative nausea and vomiting)    severe nausea requiring many doses of post op antiemetics   Stroke ([place]) 10/2017   thinks she had a series of mini strokes.right leg up to right side of face were numb. no loss of consciousness   Past Surgical History:  Past Surgical History:  Procedure Laterality Date   ABDOMINAL HYSTERECTOMY  2007   AV FISTULA INSERTION W/ RF MAGNETIC GUIDANCE N/A 12/08/2017   Procedure: AV FISTULA INSERTION W/RF MAGNETIC GUIDANCE;  Surgeon: Katha Cabal, MD;  Location: [place] [facility];  Service: Cardiovascular;  Laterality: N/A;   DIALYSIS/PERMA CATHETER INSERTION Right 10/03/2021   Procedure: DIALYSIS/PERMA CATHETER INSERTION;  Surgeon: Katha Cabal, MD;  Location: [place] [facility];  Service: Cardiovascular;  Laterality: Right;   HIP ARTHROPLASTY Left 09/29/2021   Procedure: ARTHROPLASTY BIPOLAR HIP (HEMIARTHROPLASTY);  Surgeon: Lovell Sheehan, MD;  Location: [facility];  Service: Orthopedics;  Laterality: Left;   QUADRICEPS TENDON REPAIR Right 10/16/2021   Procedure: REPAIR QUADRICEP TENDON;  Surgeon: Georgeanna Harrison, MD;  Location: [place];  Service: Orthopedics;  Laterality: Right;   ROTATOR CUFF REPAIR Right 2004   SHOULDER CLOSED  REDUCTION Right 2004   UPPER EXTREMITY VENOGRAPHY Left 02/15/2018   Procedure: UPPER EXTREMITY VENOGRAPHY;  Surgeon: Katha Cabal, MD;  Location: [place] [facility];  Service: Cardiovascular;  Laterality: Left;   Social History:  reports that she has never smoked. She has never used smokeless tobacco. She reports that she does not drink alcohol and does not use drugs.  Family / Support Systems    Social History Preferred language: English Religion: Christian     Abuse/Neglect Abuse/Neglect Assessment Can Be Completed: Yes Physical Abuse: Denies Verbal Abuse: Denies Sexual Abuse: Denies Exploitation of patient/patient's resources: Denies Self-Neglect: Denies  Patient response to: Social Isolation - How often do you feel lonely or isolated from those around you?: Never  Emotional Status    Patient / Family Perceptions, Expectations & Goals    Community Resources    Discharge Planning    Clinical Impression Patient readmitted. Assessed on 2/7, check assessment note  [name] 10/20/2021, 9:50 AM

## 2021-10-20 NOTE — Progress Notes (Signed)
Occupational Therapy Session Note ° °Patient Details  °Name: [name] °MRN: [id number] °Date of Birth: 07/27/1966 ° °Today's Date: 10/20/2021 °OT Individual Time: 1000-1030 °OT Individual Time Calculation (min): 30 min  ° ° °Short Term Goals: °Week 1:  OT Short Term Goal 1 (Week 1): Pt will complete LB dressing with Mod A using AE as needed °OT Short Term Goal 2 (Week 1): Pt will complete BSC transfer with 1 assist using LRAD to promote OOB toileting °OT Short Term Goal 3 (Week 1): Pt will complete 1/3 components of toileting with supervision ° °Skilled Therapeutic Interventions/Progress Updates:  °  Pt received in wc.  She pointed out that her primary therapist obtained a padded open seat tub bench for her to use as a BSC.  The only problem was the metal basket holder for the bucket was missing so the bucket could not slide underneath the seat well and then it was very difficult to get out. Suggested we try to put it over the toilet.  Adjusted fit of bench to fit as low as possible over toilet with seat elevated.  Pt brought into bathroom to use board to BSC.  She did fairly well with transfer with min A.  (+2 A from rehab tech to ensure safety). The difficulty came once she was on the seat as it was still too high for her leg length and the back of her KI was hitting front of seat and causing her discomfort. Also that height will definitely make it too difficulty to do lateral leans for clothing management. She also felt she had to go uphill to the seat.  The bench can be lowered more but then it will not fit over the toilet.  Tried to problem solve some other solutions.  Pt was able to transfer back to wc with board but then needed cues on how to fully scoot back into seat.   °Pt adjusted into seat with all needs met.  °(Later in day found the metal support on another bench - informed her primary OT) ° °Therapy Documentation °Precautions:  °Precautions °Precautions: Fall, Anterior Hip, Other  (comment) °Precaution Comments: HD access port RUQ, L LE anterior hip precautions, R LE rehab protocol in pt's paper chart °Required Braces or Orthoses: Knee Immobilizer - Right °Knee Immobilizer - Right: On at all times, Other (comment) (locked into extension) °Restrictions °Weight Bearing Restrictions: Yes °RLE Weight Bearing: Weight bearing as tolerated °LLE Weight Bearing: Weight bearing as tolerated °Other Position/Activity Restrictions: L LE anterior hip precautions - no hip extension, hip external rotation, or abduction ° °Pain: pt had some pain in back of R thigh when trying to sit on open seat tub bench °  °ADL: °ADL °Grooming: Setup °Where Assessed-Grooming: Edge of bed °Upper Body Bathing: Setup °Where Assessed-Upper Body Bathing: Edge of bed °Lower Body Bathing: Minimal assistance °Where Assessed-Lower Body Bathing: Edge of bed °Upper Body Dressing: Setup °Where Assessed-Upper Body Dressing: Edge of bed °Lower Body Dressing: Maximal assistance °Where Assessed-Lower Body Dressing: Edge of bed °Toileting: Not assessed °Toilet Transfer: Not assessed °Tub/Shower Transfer: Not assessed ° °Therapy/Group: Individual Therapy ° °SAGUIER,JULIA °10/20/2021, 8:16 AM °

## 2021-10-20 NOTE — Progress Notes (Signed)
Physical Therapy Session Note  Patient Details  Name: [name] MRN: [id number] Date of Birth: [birth year]  Today's Date: 10/20/2021 PT Individual Time: 9518-8416 PT Individual Time Calculation (min): 70 min   Short Term Goals: Week 1:  PT Short Term Goal 1 (Week 1): Pt will perform supine<>sit with mod assist of 1 consistently PT Short Term Goal 2 (Week 1): Pt will perform bed<>chair transfers using LRAD with mod assist of 1 consistently PT Short Term Goal 3 (Week 1): Pt will perform sit<>stand using LRAD with +2 mod assist  Skilled Therapeutic Interventions/Progress Updates:   Received pt sitting in [facility] with MD present at bedside, reporting being "ready to stand". Pt agreeable to PT treatment and denied any pain just "soreness" in RLE. RLE Bledsoe brace donned and locked into extension throughout session. Session with emphasis on functional mobility/transfers, generalized strengthening, standing tolerance, and improved endurance with activity. Pt transported to/from room in [facility] dependently for time management purposes. Pt transferred WC<>mat via slideboard with min A of 1 downhill with significantly increased time due to difficulty scooting on board. Placed Ethelene Hal and mirror in front of pt and raised mat to 23.5 in and worked on [employer] with mod A but pt unable to come fully upright and afraid to allow therapist to raise Harmon Pier walker up higher to assist in getting upright. Pt demonstrating decent adherence to RLE TDWB precautions but frequently moaning in discomfort/fear - pt lacking ~ 5 degrees knee extension in stance. Pt required frequent rest breaks due to fatigue then transferred mat<>WC via slideboard downhill with min A and again with increased time. Pt performed BUE strengthening on UBE at level 2.5 for 3 minutes forward and 3 minutes backwards with 1 rest break halfway through with emphasis on UE strength and endurance. Pt requested to return to bed at end of session for dialysis  and transferred WC<>bed via slideboard uphill with mod/light max A due to fatigue and transferred sit<>supine with max A for BLE management. Pt scooted to [facility] with mod A and use of Trendelenburg position pulling on headboard.  Concluded session with pt semi-reclined in bed, needs within reach, and bed alarm on.   Therapy Documentation Precautions:  Precautions Precautions: Fall, Anterior Hip, Other (comment) Precaution Comments: HD access port RUQ, L LE anterior hip precautions, R LE rehab protocol in pt's paper chart Required Braces or Orthoses: Knee Immobilizer - Right Knee Immobilizer - Right: On at all times, Other (comment) (locked into extension) Restrictions Weight Bearing Restrictions: Yes RLE Weight Bearing: Weight bearing as tolerated LLE Weight Bearing: Weight bearing as tolerated Other Position/Activity Restrictions: L LE anterior hip precautions - no hip extension, hip external rotation, or abduction  Therapy/Group: Individual Therapy Alfonse Alpers PT, DPT   10/20/2021, 7:29 AM

## 2021-10-21 LAB — GLUCOSE, CAPILLARY
Glucose-Capillary: 194 mg/dL — ABNORMAL HIGH (ref 70–99)
Glucose-Capillary: 233 mg/dL — ABNORMAL HIGH (ref 70–99)
Glucose-Capillary: 250 mg/dL — ABNORMAL HIGH (ref 70–99)
Glucose-Capillary: 207 mg/dL — ABNORMAL HIGH (ref 70–99)

## 2021-10-21 MED ORDER — INSULIN GLARGINE-YFGN 100 UNIT/ML ~~LOC~~ SOLN
10.0000 [IU] | Freq: Every day | SUBCUTANEOUS | Status: DC
  Administered 2021-10-21 – 2021-10-22 (×2): 10 [IU] via SUBCUTANEOUS
  Filled 2021-10-21 (×2): qty 0.1

## 2021-10-21 NOTE — Progress Notes (Signed)
Occupational Therapy Session Note  Patient Details  Name: [name] MRN: [id number] Date of Birth: Sep 15, 1965  Today's Date: 10/21/2021 OT Individual Time: 3383-2919 OT Individual Time Calculation (min): 57 min    Short Term Goals: Week 1:  OT Short Term Goal 1 (Week 1): Pt will complete LB dressing with Mod A using AE as needed OT Short Term Goal 2 (Week 1): Pt will complete BSC transfer with 1 assist using LRAD to promote OOB toileting OT Short Term Goal 3 (Week 1): Pt will complete 1/3 components of toileting with supervision  Skilled Therapeutic Interventions/Progress Updates:  Skilled OT intervention completed with focus on functional transfers, AE education, self-care. Pt received upright in bed, agreeable to session. Pt completed bed mobility to EOB with supervision, little faster paced this session. While EOB, therapist re-educated/demo use of reacher for LB dressing, with pt able to return demonstrate with cues only for technique and min A overall for lifting RLE during lateral leans for hip clearance and for pulling shorts up in the back for full coverage. Pt would benefit from lateral leaning techniques for efficiency with LB self-care. Pt improved her slide board transfer to min A of 1 this session (total A for board placement), with cues needed for hand placement still however better sliding presumably from longer pants vs shorts. Seated at sink, pt able to complete UB self-care and grooming with mod I. Pt reported that the toilet transfer yesterday "went awful" due to the height and equipment not being level and the bucket issue, with this therapist suggesting she practice a toilet transfer to DABBSC instead, however pt stating "oh if it's not padded, I won't go." Discussed need of practicing toilet transfers via slideboard to padded tub bench as BSC, however will need to strategize getting a functional bucket for actual toileting. Educated pt that her DME if ordered would come with a  bucket that fits, and pt preferring to bring "[facility]" out into room vs over toilet for practice. While seated in w/c pt requesting to complete LLE exercises including the following for functional use of LLE during LB dressing: toe raises x20, heel raises x20 (pt unable to complete full knee raise without both hands assisting), and knee extension 2x20. Pt left seated in w/c, with chair alarm on and all needs in reach at end of session.   Therapy Documentation Precautions:  Precautions Precautions: Fall, Anterior Hip, Other (comment) Precaution Comments: HD access port RUQ, L LE anterior hip precautions, R LE rehab protocol in pt's paper chart Required Braces or Orthoses: Knee Immobilizer - Right Knee Immobilizer - Right: On at all times, Other (comment) Restrictions Weight Bearing Restrictions: Yes RLE Weight Bearing: Touchdown weight bearing LLE Weight Bearing: Weight bearing as tolerated Other Position/Activity Restrictions: L LE anterior hip precautions - no hip extension, hip external rotation, or abduction  Pain: No c/o pain  Therapy/Group: Individual Therapy  Liberta Gimpel E Nicolemarie Wooley 10/21/2021, 7:20 AM

## 2021-10-21 NOTE — Progress Notes (Signed)
°  [facility] Progress Note    Assessment/ Plan:   Debility - rehab per CIR RLE tendon rupture - s/p surgery 2/16 L hip fracture/ R pelvic ramus fx - sp L hemiarthroplasty on 09/29/21 at [facility], then sent here for CIR ESRD - on HD MWF. Continue on schedule. Next HD Wed.  BP/ volume - BP acceptable. Continue home meds.  Under dry wt, lower edw at dc DM2 - per pmd Anemia ckd - Last Hgb 8.9 on 2/17-improving. S/p transfusion. ESA started,  Aranesp 40 on 2/10.  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  Continue binders.  9. Nutrition - renal diet  [name], MD 10/21/2021, 2:37 PM      Dialysis Orders: MWF [place] [phone])  3h 53min  81kg   Hep 3000 then 800u/hr  RIJ TDC    - Hep B SAg neg 2/8, covid neg 1/29  Subjective:   No c/o's.    Objective:   BP (!) 160/71 (BP Location: Right Arm)    Pulse 93    Temp 98.1 F (36.7 C) (Oral)    Resp 18    Ht 5\' 7"  (1.702 m)    Wt 76.3 kg    SpO2 100%    BMI 26.35 kg/m   Intake/Output Summary (Last 24 hours) at 10/21/2021 1437 Last data filed at 10/20/2021 1730 Gross per 24 hour  Intake --  Output 1505 ml  Net -1505 ml    Weight change:   Physical Exam:  alert, nad   no jvd  Chest cta bilat  Cor reg no RG  Abd soft ntnd no ascites   Ext no LE edema   Alert, NF, ox3   Dialysis access: [facility]  Imaging: No results found.  Labs: BMET Recent Labs  Lab 10/16/21 1335 10/17/21 0139 10/20/21 1455  NA 136 134* 134*  K 4.4 5.2* 4.8  CL 100 96* 95*  CO2  --  23 23  GLUCOSE 166* 255* 245*  BUN 31* 43* 68*  CREATININE 5.40* 5.91* 7.97*  CALCIUM  --  9.2 9.0  PHOS  --  4.4 4.9*    CBC Recent Labs  Lab 10/16/21 1335 10/17/21 0139 10/20/21 1456  WBC  --  11.8* 8.3  HGB 10.9* 8.9* 8.6*  HCT 32.0* 27.3* 27.2*  MCV  --  98.2 100.4*  PLT  --  279 248     Medications:     Chlorhexidine Gluconate Cloth  6 each Topical Q0600   darbepoetin (ARANESP) injection - DIALYSIS  40 mcg Intravenous Q  Fri-HD   enoxaparin (LOVENOX) injection  30 mg Subcutaneous Q24H   insulin aspart  0-9 Units Subcutaneous TID WC   insulin glargine-yfgn  10 Units Subcutaneous Daily   losartan  50 mg Oral Daily   Vitamin D (Ergocalciferol)  50,000 Units Oral Q7 days

## 2021-10-21 NOTE — Progress Notes (Signed)
Occupational Therapy Session Note  Patient Details  Name: [name] MRN: [id number] Date of Birth: 12-23-1965  Today's Date: 10/21/2021 OT Individual Time: 6770-3403 OT Individual Time Calculation (min): 55 min    Short Term Goals: Week 1:  OT Short Term Goal 1 (Week 1): Pt will complete LB dressing with Mod A using AE as needed OT Short Term Goal 2 (Week 1): Pt will complete BSC transfer with 1 assist using LRAD to promote OOB toileting OT Short Term Goal 3 (Week 1): Pt will complete 1/3 components of toileting with supervision  Skilled Therapeutic Interventions/Progress Updates:    Pt received seated in w/c, reports ongoing L hip pain but "is to be expected", agreeable to therapy. Session focus on activity tolerance, transfer retraining, BUE/core strengthening in prep for improved ADL/IADL/func mobility performance + decreased caregiver burden.Pt requesting to go outside. Self-propelled ~75% of way to and from hospital atrium with BUE with overall min A to prevent L veering to target BUE strengthening. Required min to mod A to self-propel w/c on uneven surfaces/up incline outdoors. Pt very appreciative of being outdoors.  Completed 2x10 of the following to target BUE/core strength/mobility in prep for improved hip clearance/lateral leans during transfers and ADL: russian core twists, overhead triceps extension, and w/c push-ups.   SB transfer > bed on her R with total A to place board and overall min A for steadying assist of board/w/c during transfer. Unable to scoot hips towards HOB due to L hip pain, required mod A to progress BLE onto bed, and use of tilt bed features to boost up.  Pt left semi-reclined in bed with bed alarm engaged, call bell in reach, and all immediate needs met.    Therapy Documentation Precautions:  Precautions Precautions: Fall, Anterior Hip, Other (comment) Precaution Comments: HD access port RUQ, L LE anterior hip precautions, R LE rehab protocol in pt's  paper chart Required Braces or Orthoses: Knee Immobilizer - Right Knee Immobilizer - Right: On at all times, Other (comment) (locked into extension) Restrictions Weight Bearing Restrictions: Yes RLE Weight Bearing: Weight bearing as tolerated LLE Weight Bearing: Touchdown weight bearing Other Position/Activity Restrictions: L LE anterior hip precautions - no hip extension, hip external rotation, or abduction  Pain: see session note   ADL: See Care Tool for more details.  Therapy/Group: Individual Therapy  Volanda Napoleon MS, OTR/L  10/21/2021, 6:59 AM

## 2021-10-21 NOTE — Progress Notes (Signed)
PROGRESS NOTE   Subjective/Complaints: Pain well controlled Stood with [name] 10 times today!! Husband here today, very supportive. Grounds pass ordered.   ROS: No CP, SOB, N/V +pain in bilateral lower extremities, post-op pain in right lower extremity improved, constipation resolved  Objective:   No results found. Recent Labs    10/20/21 1456  WBC 8.3  HGB 8.6*  HCT 27.2*  PLT 248     Recent Labs    10/20/21 1455  NA 134*  K 4.8  CL 95*  CO2 23  GLUCOSE 245*  BUN 68*  CREATININE 7.97*  CALCIUM 9.0      Intake/Output Summary (Last 24 hours) at 10/21/2021 1427 Last data filed at 10/20/2021 1730 Gross per 24 hour  Intake --  Output 1505 ml  Net -1505 ml         Physical Exam: Vital Signs Blood pressure (!) 160/71, pulse 93, temperature 98.1 F (36.7 C), temperature source Oral, resp. rate 18, height 5\' 7"  (1.702 m), weight 76.3 kg, SpO2 100 %. Gen: no distress, normal appearing, BMI 26.35 HEENT: oral mucosa pink and moist, NCAT Cardio: Reg rate Chest: normal effort, normal rate of breathing Abd: soft, non-distended Ext: no edema Psych: pleasant, normal affect Musculoskeletal:        General: Swelling and tenderness present.     Cervical back: Normal range of motion and neck supple.     Right lower leg: Edema present.     Left lower leg: Edema present.     Comments: Left hip tender to palpation and PROM with associated swelling. Left knee with mild effusion, patella laxity. Right hip tender with leg raise  Active range of motion by patient with good improvements. Right knee in immobilizer.  Right knee tender and swollen Able to get out of bed today Skin:    Comments: Incision site dressing clean dry and intact.  R IJ TDC with bandage Neurological:     Comments: Patient is alert.  No acute distress.  Oriented x3 and follows commands. Alert and oriented x 3. Normal insight and awareness. Intact  Memory. Normal language and speech. Cranial nerve exam unremarkable. UE motor 5/5. LE motor limited proximally by pain. ADF/PF 4-5/5. No sensory findings.   Psychiatric:     Comments: Pleasant, slightly anxious  GU: stool incontinence in diaper   Assessment/Plan: 1. Functional deficits which require 3+ hours per day of interdisciplinary therapy in a comprehensive inpatient rehab setting. Physiatrist is providing close team supervision and 24 hour management of active medical problems listed below. Physiatrist and rehab team continue to assess barriers to discharge/monitor patient progress toward functional and medical goals  Care Tool:  Bathing    Body parts bathed by patient: Face   Body parts bathed by helper: Left lower leg Body parts n/a: Right lower leg   Bathing assist Assist Level: Independent with assistive device     Upper Body Dressing/Undressing Upper body dressing   What is the patient wearing?: Dress    Upper body assist Assist Level: Set up assist    Lower Body Dressing/Undressing Lower body dressing      What is the patient wearing?: Pants  Lower body assist Assist for lower body dressing: Minimal Assistance - Patient > 75%     Toileting Toileting Toileting Activity did not occur Landscape architect and hygiene only): N/A (no void or bm)  Toileting assist       Transfers Chair/bed transfer  Transfers assist     Chair/bed transfer assist level: Minimal Assistance - Patient > 75% (slideboard) Chair/bed transfer assistive device: Sliding board (slideboard)   Locomotion Ambulation   Ambulation assist   Ambulation activity did not occur: Safety/medical concerns          Walk 10 feet activity   Assist  Walk 10 feet activity did not occur: Safety/medical concerns        Walk 50 feet activity   Assist Walk 50 feet with 2 turns activity did not occur: Safety/medical concerns         Walk 150 feet activity   Assist Walk  150 feet activity did not occur: Safety/medical concerns         Walk 10 feet on uneven surface  activity   Assist Walk 10 feet on uneven surfaces activity did not occur: Safety/medical concerns         Wheelchair     Assist Is the patient using a wheelchair?: Yes Type of Wheelchair: Manual    Wheelchair assist level: Supervision/Verbal cueing, Set up assist Max wheelchair distance: 158ft    Wheelchair 50 feet with 2 turns activity    Assist        Assist Level: Supervision/Verbal cueing   Wheelchair 150 feet activity     Assist      Assist Level: Supervision/Verbal cueing   Blood pressure (!) 160/71, pulse 93, temperature 98.1 F (36.7 C), temperature source Oral, resp. rate 18, height 5\' 7"  (1.702 m), weight 76.3 kg, SpO2 100 %.    Medical Problem List and Plan: 1. Functional deficits secondary to nondisplaced fracture right inferior pubic ramus as well as a displaced foreshortened fracture through the left femoral neck/intertrochanteric area.  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   -Weightbearing as tolerated with anterior total hip precautions.                                  -Conservative care right inferior pubic ramus fracture, weightbearing as tolerated             -patient may  shower             -ELOS/Goals: 3-4 weeks total, supervision to min assist goals  Discussed with team plan for surgery on Thursday. NPO at midnight on Wednesday  Continue CIR 2.  Antithrombotics: -DVT/anticoagulation:  Mechanical: Antiembolism stockings, thigh (TED hose) Bilateral lower extremities check vascular study.   -should be able to begin lovenox today as permacath placed            -antiplatelet therapy: N/A 3. Femur fracture pain: continue Flector patch,Robaxin 750 mg 4 times daily, hydrocodone and oxycodone as needed             -pt having significant spasms in left thigh.             -add kpad to help with spasms  -schedule tylenol  1000mg  TID 4. Anxiety: Instructed on deep breathing techniques. Provide emotional support.  Melatonin as needed             -antipsychotic agents: N/A 5. Neuropsych: This patient is capable of making decisions on  her own behalf. 6. Skin/Wound Care: Routine skin checks 7. Fluids/Electrolytes/Nutrition: Routine in and outs with follow-up chemistries 8.  End-stage renal disease/hemodialysis.  Permacath exchanged 10/03/2021.    -  Follow-up hemodialysis per Dr. [name] -HD later in day to allow participation in therapies during the day 9.  Acute on chronic anemia.  Has had transfusions already x 2. Hgb has stabilized in 7 range. Continue iron supplement.              -epo per nephrology 10.  Diabetes mellitus with peripheral neuropathy. Placed order that she may use her Dexcom 6. Hemoglobin A1c 6.2.  SSI as prior to admission. Provide dietary education.  CBG (last 3)  Recent Labs    10/20/21 2103 10/21/21 0535 10/21/21 1157  GLUCAP 336* 194* 233*  Some lability will monitor. Start Semglee 10U daily  11.  Hypertension. Continue cozaar Vitals:   10/21/21 0524 10/21/21 1258  BP: (!) 147/69 (!) 160/71  Pulse: 93 93  Resp: 18 18  Temp: 97.9 F (36.6 C) 98.1 F (36.7 C)  SpO2: 100% 100%  Controlled 2/21  12.  Hyperparathyroidism of renal origin.  Plan outpatient parathyroidectomy per Dr.Lateef 13.  Overweight.  BMI 29.11-->26.35  Dietary follow-up. Provided list of foods that can help with weight loss. Discussed benefits of intermittent fasting.  14. Cushingoid?--outpt work up 39. Constipation: resolved. Decrease colace to daily prn. D/c senna. Discussed benefits of high fiber foods. Continue bowel regimen. D/c miralax.  16. Right quadriceps tear: surgery complete with good healing on XR, minimal postop pain, return to rehab today, discussed importance of participating in 3 hours therapy daily as much as she can tolerate and she is very motivated and excited for rehab 17. Gas: continue  simethicone 18. Ileus: resolved, regular diet resumed -tolerating.  19. Abd spasms: resolved 20. Black tarry stools: discussed heme occult negative last night. Hgb reviewed and stable.  21. Vitamin D deficiency: start ergocalciferol 50,000U once per week for 7 weeks. Discussed that this dose is higher than a daily supplement.     LOS: 4 days A FACE TO FACE EVALUATION WAS PERFORMED  Clide Deutscher Ahnyla Mendel 10/21/2021, 2:27 PM

## 2021-10-21 NOTE — Progress Notes (Signed)
Physical Therapy Session Note  Patient Details  Name: [name] MRN: [id number] Date of Birth: February 20, 1966  Today's Date: 10/21/2021 PT Individual Time: 1100-1155 PT Individual Time Calculation (min): 55 min   Short Term Goals: Week 1:  PT Short Term Goal 1 (Week 1): Pt will perform supine<>sit with mod assist of 1 consistently PT Short Term Goal 2 (Week 1): Pt will perform bed<>chair transfers using LRAD with mod assist of 1 consistently PT Short Term Goal 3 (Week 1): Pt will perform sit<>stand using LRAD with +2 mod assist  Skilled Therapeutic Interventions/Progress Updates:   Received pt sitting in WC, pt agreeable to PT treatment, and denied any pain during session just "soreness". Session with emphasis on functional mobility/transfers, generalized strengthening and endurance, and standing tolerance/balance. Pt transported to/from room in [facility] dependently for time management purposes. Pt transferred WC<>mat via slideboard with min A and increased time to scoot over. Raised mat to almost full standing position and pt worked on sit<>stands with Harmon Pier walker and mod A of 1 with +2 on standby using mirror for visual feedback - min cues for adherence to RLE TDWB precautions. Pt stood x10 reps (tried placing Airex underneath RLE for better adherence to RLE TDWB precautions but pt reported increased difficulty standing). Pt's husband arrived and watched pt practice sit<>stands and provided encouragement. Pt transferred mat<>WC via lateral scoot (requested not to use slideboard) with close supervision/CGA with therapist stabilizing WC and returned to room. Pt agreed to remain sitting up in [facility] for lunch. Concluded session with pt sitting in [facility] with all needs within reach and husband present at bedside.   Therapy Documentation Precautions:  Precautions Precautions: Fall, Anterior Hip, Other (comment) Precaution Comments: HD access port RUQ, L LE anterior hip precautions, R LE rehab protocol in pt's paper  chart Required Braces or Orthoses: Knee Immobilizer - Right Knee Immobilizer - Right: On at all times, Other (comment) Restrictions Weight Bearing Restrictions: Yes RLE Weight Bearing: Touchdown weight bearing LLE Weight Bearing: Weight bearing as tolerated Other Position/Activity Restrictions: L LE anterior hip precautions - no hip extension, hip external rotation, or abduction  Therapy/Group: Individual Therapy Alfonse Alpers PT, DPT   10/21/2021, 7:32 AM

## 2021-10-22 LAB — GLUCOSE, CAPILLARY
Glucose-Capillary: 178 mg/dL — ABNORMAL HIGH (ref 70–99)
Glucose-Capillary: 193 mg/dL — ABNORMAL HIGH (ref 70–99)
Glucose-Capillary: 215 mg/dL — ABNORMAL HIGH (ref 70–99)
Glucose-Capillary: 264 mg/dL — ABNORMAL HIGH (ref 70–99)

## 2021-10-22 LAB — RENAL FUNCTION PANEL
Albumin: 3.1 g/dL — ABNORMAL LOW (ref 3.5–5.0)
Anion gap: 16 — ABNORMAL HIGH (ref 5–15)
BUN: 58 mg/dL — ABNORMAL HIGH (ref 6–20)
CO2: 24 mmol/L (ref 22–32)
Calcium: 9.2 mg/dL (ref 8.9–10.3)
Chloride: 96 mmol/L — ABNORMAL LOW (ref 98–111)
Creatinine, Ser: 6.92 mg/dL — ABNORMAL HIGH (ref 0.44–1.00)
GFR, Estimated: 6 mL/min — ABNORMAL LOW (ref >60)
Glucose, Bld: 221 mg/dL — ABNORMAL HIGH (ref 70–99)
Phosphorus: 5 mg/dL — ABNORMAL HIGH (ref 2.5–4.6)
Potassium: 4.3 mmol/L (ref 3.5–5.1)
Sodium: 136 mmol/L (ref 135–145)

## 2021-10-22 MED ORDER — HEPARIN SODIUM (PORCINE) 1000 UNIT/ML IJ SOLN
INTRAMUSCULAR | Status: AC
  Filled 2021-10-22: qty 3

## 2021-10-22 MED ORDER — INSULIN GLARGINE-YFGN 100 UNIT/ML ~~LOC~~ SOLN
11.0000 [IU] | Freq: Every day | SUBCUTANEOUS | Status: DC
  Administered 2021-10-23: 11 [IU] via SUBCUTANEOUS
  Filled 2021-10-22: qty 0.11

## 2021-10-22 MED ORDER — HEPARIN SODIUM (PORCINE) 1000 UNIT/ML DIALYSIS
1500.0000 [IU] | INTRAMUSCULAR | Status: DC | PRN
  Administered 2021-10-22: 1500 [IU] via INTRAVENOUS_CENTRAL
  Filled 2021-10-22: qty 2

## 2021-10-22 NOTE — Progress Notes (Signed)
Physical Therapy Session Note  Patient Details  Name: [name] MRN: [id number] Date of Birth: [birth year]  Today's Date: 10/22/2021 PT Individual Time: 0800-0857 PT Individual Time Calculation (min): 57 min   Short Term Goals: Week 1:  PT Short Term Goal 1 (Week 1): Pt will perform supine<>sit with mod assist of 1 consistently PT Short Term Goal 2 (Week 1): Pt will perform bed<>chair transfers using LRAD with mod assist of 1 consistently PT Short Term Goal 3 (Week 1): Pt will perform sit<>stand using LRAD with +2 mod assist  Skilled Therapeutic Interventions/Progress Updates:      Pt presenting supine in bed. Pt agreeable to PT tx - denies pain. R bledsoe brace on, locked in extension. Pt asking when she can have the ace bandage removed from her RLE, reports she was told this would be done 5 days post op. Messaged MD via secure chat regarding her concerns.   Pt completed supine<>sit with HOB elevated 45deg at supervision level - very effortful but capable. Able to scoot forward to EOB with supervision assist. Completed SB transfer from EOB to w/c, towards her R side, with CGA. Assist needed for board placement. Required several small scoots to achieve full transfer. Once in w/c, she was able to scoot herself posteriorly with supervision and ++ time. Pt requesting to complete a few self care tasks at the sink prior to leaving her room. At the sink, she washed her face, brushed her teeth, combed and blow dry her hair, etc.   She propelled herself with distant supervision in w/c from her room to main rehab gym, ~134ft. Required assist for w/c setup to prepare for SB transfer to mat table. SB transfer completed with CGA to slightly elevated mat table. Unable to progress sit<>stands due to mat table that doesn't raise. Therefore, completed seated there-ex: -2x10 LLE knee extension/heel slides using pillow case under heel -1x10 LLE hamstring stretches with 30 sec holds -1x5 LLE quad stretches  with 30 sec holds  Completed SB transfer with CGA back to w/c from mat surface with VC for hand placement and assist needed for board placement. Transported back to her room in w/c for time and energy conservation. Remained seated in w/c with all immediate needs within reach, informed of upcoming therapy schedule.  Therapy Documentation Precautions:  Precautions Precautions: Fall, Anterior Hip, Other (comment) Precaution Comments: HD access port RUQ, L LE anterior hip precautions, R LE rehab protocol in pt's paper chart Required Braces or Orthoses: Knee Immobilizer - Right Knee Immobilizer - Right: On at all times, Other (comment) Restrictions Weight Bearing Restrictions: Yes RLE Weight Bearing: Touchdown weight bearing LLE Weight Bearing: Weight bearing as tolerated Other Position/Activity Restrictions: L LE anterior hip precautions - no hip extension, hip external rotation, or abduction General:   Therapy/Group: Individual Therapy  Chuckie Mccathern P Don Giarrusso PT 10/22/2021, 7:44 AM

## 2021-10-22 NOTE — Progress Notes (Signed)
°  [facility] Progress Note    Assessment/ Plan:   Debility - rehab per CIR RLE tendon rupture - s/p surgery 2/16 L hip fracture/ R pelvic ramus fx - sp L hemiarthroplasty on 09/29/21 at [facility], then sent here for CIR ESRD - on HD MWF. Continue on schedule. HD today.  BP/ volume - BP acceptable. Continue home meds.  Under dry wt, lower edw at dc DM2 - per pmd Anemia ckd - Hb better in 8- 9 range, improving. S/p transfusion. 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  Continue binders.  9. Nutrition - renal diet  [name], MD 10/22/2021, 2:53 PM      Dialysis Orders: MWF [facility] [phone])  3h 58min  81kg   Hep 3000 then 800u/hr  [facility] TDC    - Hep B SAg neg 2/8, covid neg 1/29  Subjective:   No c/o's. Seen in HD.    Objective:   BP 138/67    Pulse 84    Temp (!) 97.3 F (36.3 C) (Temporal)    Resp 16    Ht 5\' 7"  (1.702 m)    Wt 78.9 kg    SpO2 98%    BMI 27.24 kg/m   Intake/Output Summary (Last 24 hours) at 10/22/2021 1453 Last data filed at 10/22/2021 0800 Gross per 24 hour  Intake 700 ml  Output --  Net 700 ml    Weight change:   Physical Exam:  alert, nad   no jvd  Chest cta bilat  Cor reg no RG  Abd soft ntnd no ascites   Ext no LE edema   Alert, NF, ox3   Dialysis access: [facility]  Imaging: No results found.  Labs: BMET Recent Labs  Lab 10/16/21 1335 10/17/21 0139 10/20/21 1455 10/22/21 1318  NA 136 134* 134* 136  K 4.4 5.2* 4.8 4.3  CL 100 96* 95* 96*  CO2  --  23 23 24   GLUCOSE 166* 255* 245* 221*  BUN 31* 43* 68* 58*  CREATININE 5.40* 5.91* 7.97* 6.92*  CALCIUM  --  9.2 9.0 9.2  PHOS  --  4.4 4.9* 5.0*    CBC Recent Labs  Lab 10/16/21 1335 10/17/21 0139 10/20/21 1456  WBC  --  11.8* 8.3  HGB 10.9* 8.9* 8.6*  HCT 32.0* 27.3* 27.2*  MCV  --  98.2 100.4*  PLT  --  279 248     Medications:     Chlorhexidine Gluconate Cloth  6 each Topical  Q0600   darbepoetin (ARANESP) injection - DIALYSIS  40 mcg Intravenous Q Fri-HD   enoxaparin (LOVENOX) injection  30 mg Subcutaneous Q24H   heparin sodium (porcine)       insulin aspart  0-9 Units Subcutaneous TID WC   [START ON 10/23/2021] insulin glargine-yfgn  11 Units Subcutaneous Daily   losartan  50 mg Oral Daily   Vitamin D (Ergocalciferol)  50,000 Units Oral Q7 days

## 2021-10-22 NOTE — Progress Notes (Signed)
Physical Therapy Session Note  Patient Details  Name: [name] MRN: [id number] Date of Birth: August 10, 1966  Today's Date: 10/22/2021 PT Individual Time: 1000-1054 PT Individual Time Calculation (min): 54 min   Short Term Goals: Week 1:  PT Short Term Goal 1 (Week 1): Pt will perform supine<>sit with mod assist of 1 consistently PT Short Term Goal 2 (Week 1): Pt will perform bed<>chair transfers using LRAD with mod assist of 1 consistently PT Short Term Goal 3 (Week 1): Pt will perform sit<>stand using LRAD with +2 mod assist  Skilled Therapeutic Interventions/Progress Updates:   Received pt sitting in WC, pt agreeable to PT treatment, and reported pain 7-8/10 in LLE, R knee, and buttocks but requested to wait until end of session for pain medication. Session with emphasis on functional mobility/transfers, generalized strengthening and endurance, and standing tolerance/balance. Pt performed WC mobility 136ft using BUE and supervision to dayroom and transferred WC<>mat via slideboard with mod A due to fatigue and discomfort on buttocks. Raised mat and placed [name] walker and mirror in front of pt. Sit<>stand from elevated mat with Harmon Pier walker and mod A of 1 x 5 trials - placed Airex under RLE to assist in adherence to RLE TDWB precautions. Cues for upright posture/gaze and L knee extension. While standing worked on letting go of handgrips x3 on each UE to encourage further use of LLE and challenge balance. Pt transferred mat<>WC via slideboard with CGA going downhill. Pt performed seated LLE strengthening on Kinetron at 30 cm/sec for 2x10 reps with BUE support and therapist providing light manual counter resistance with emphasis on quad/glute strength and L knee ROM. Pt transported back to room in [facility] dependently and transferred WC<>bed via lateral scoot (pt declined using slideboard due to discomfort on buttocks) with mod/max A for pelvic control due to fatigue - pt required max A to scoot hips back on  bed. Sit<>supine with max A for BLE management. Concluded session with pt semi-reclined in bed, needs within reach, and bed alarm on. RN notified of pt's request for pain medication.   Therapy Documentation Precautions:  Precautions Precautions: Fall, Anterior Hip, Other (comment) Precaution Comments: HD access port RUQ, L LE anterior hip precautions, R LE rehab protocol in pt's paper chart Required Braces or Orthoses: Knee Immobilizer - Right Knee Immobilizer - Right: On at all times, Other (comment) Restrictions Weight Bearing Restrictions: Yes RLE Weight Bearing: Touchdown weight bearing LLE Weight Bearing: Weight bearing as tolerated Other Position/Activity Restrictions: L LE anterior hip precautions - no hip extension, hip external rotation, or abduction  Therapy/Group: Individual Therapy Alfonse Alpers PT, DPT   10/22/2021, 7:27 AM

## 2021-10-22 NOTE — Progress Notes (Signed)
Occupational Therapy Session Note  Patient Details  Name: [name] MRN: [id number] Date of Birth: 01/13/1966  Today's Date: 10/22/2021 OT Individual Time: 2694-8546 OT Individual Time Calculation (min): 55 min    Short Term Goals: Week 1:  OT Short Term Goal 1 (Week 1): Pt will complete LB dressing with Mod A using AE as needed OT Short Term Goal 2 (Week 1): Pt will complete BSC transfer with 1 assist using LRAD to promote OOB toileting OT Short Term Goal 3 (Week 1): Pt will complete 1/3 components of toileting with supervision  Skilled Therapeutic Interventions/Progress Updates:  Skilled OT intervention completed with focus on functional sit > stands in eva walker to prep for ADL task. Pt received seated in w/c agreeable to session. Denied pain other than soreness in L leg, but no pain. Pt self-propelled in w/c to day room for BUE endurance. Pt slideboard transfer from w/c <> EOM with total A for board placement and min A of 1 to mat. With mat table highly elevated and eva walker positioned in front of pt, pt participated in 5 total stands. Therapist utilized foam mat with dycem under pt's R foot to maintain TDWB precaution. Pt able to stand with mod A of 1, with therapist blocking front of eva walker due to her dependency of pulling and forward lean, CGA-min A at hips while up in standing. Pt able to tolerate 45 second intervals once up. Pt required assist to monitor WB status in R leg despite foam pad, with corrections needed to extend leg further out in front of her. Pt reporting she wants to be able to not rely on forearms as much, with therapist upgrading task to encouraging pt to release L forearm from pad to reach forward and tap/transfer cone on elevated table in front of her. Pt with fear of releasing, and only able to release L hand from a strong grip to do 5 table taps with forearm still down. Education provided on purpose of releasing UE support, to prepare for ADL task like donning  pants. Transported pt with total A in w/c back to room, and was left seated in w/c with chair alarm on and all needs in reach.   Therapy Documentation Precautions:  Precautions Precautions: Fall, Anterior Hip, Other (comment) Precaution Comments: HD access port RUQ, L LE anterior hip precautions, R LE rehab protocol in pt's paper chart Required Braces or Orthoses: Knee Immobilizer - Right Knee Immobilizer - Right: On at all times, Other (comment) Restrictions Weight Bearing Restrictions: Yes RLE Weight Bearing: Touchdown weight bearing LLE Weight Bearing: Weight bearing as tolerated Other Position/Activity Restrictions: L LE anterior hip precautions - no hip extension, hip external rotation, or abduction  Pain: Only c/o soreness in L leg, no pain rated, improved with rest   Therapy/Group: Individual Therapy  [name] E Coy Rochford 10/22/2021, 7:33 AM

## 2021-10-22 NOTE — Progress Notes (Signed)
Team Conference Report to Patient/Family  Team Conference discussion was reviewed with the patient and caregiver, including goals, any changes in plan of care and target discharge date.  Patient and caregiver express understanding and are in agreement.  The patient has a target discharge date of 11/08/21.   Sw followed up with the patient and provided team conference information. Patient concerned that if she is unable to meet goals, will she be extended? Sw informed patient that we will continue to monitor progress and the team will discuss. No additional questions or concerns.   [name] 10/22/2021, 2:06 PM

## 2021-10-22 NOTE — Progress Notes (Signed)
PROGRESS NOTE   Subjective/Complaints: Working with Darrick Meigs and Kae Heller in therapy gym She has no new complaints Order placed for dressing removal to inspect wound today, can reapply dressing for comfort afterward   ROS: No CP, SOB, N/V +pain in bilateral lower extremities, post-op pain in right lower extremity improved, constipation resolved, +bowel incontinence  Objective:   No results found. Recent Labs    10/20/21 1456  WBC 8.3  HGB 8.6*  HCT 27.2*  PLT 248     Recent Labs    10/20/21 1455  NA 134*  K 4.8  CL 95*  CO2 23  GLUCOSE 245*  BUN 68*  CREATININE 7.97*  CALCIUM 9.0      Intake/Output Summary (Last 24 hours) at 10/22/2021 1218 Last data filed at 10/21/2021 1900 Gross per 24 hour  Intake 460 ml  Output --  Net 460 ml         Physical Exam: Vital Signs Blood pressure (!) 156/79, pulse 90, temperature 97.7 F (36.5 C), temperature source Oral, resp. rate 16, height 5\' 7"  (1.702 m), weight 76.3 kg, SpO2 98 %. Gen: no distress, normal appearing, BMI 26.35 HEENT: oral mucosa pink and moist, NCAT Cardio: Reg rate Chest: normal effort, normal rate of breathing Abd: soft, non-distended Ext: no edema Psych: pleasant, normal affect Musculoskeletal:        General: Swelling and tenderness present.     Cervical back: Normal range of motion and neck supple.     Right lower leg: Edema present.     Left lower leg: Edema present.     Comments: Left hip tender to palpation and PROM with associated swelling. Left knee with mild effusion, patella laxity. Right hip tender with leg raise  Active range of motion by patient with good improvements. Right knee in immobilizer.  Right knee tender and swollen, ace bandage in place Able to get out of bed today Skin:    Comments: Incision site dressing clean dry and intact.  R IJ TDC with bandage Neurological:     Comments: Patient is alert.  No acute  distress.  Oriented x3 and follows commands. Alert and oriented x 3. Normal insight and awareness. Intact Memory. Normal language and speech. Cranial nerve exam unremarkable. UE motor 5/5. LE motor limited proximally by pain. ADF/PF 4-5/5. No sensory findings.   Psychiatric:     Comments: Pleasant, slightly anxious  GU: stool incontinence in diaper   Assessment/Plan: 1. Functional deficits which require 3+ hours per day of interdisciplinary therapy in a comprehensive inpatient rehab setting. Physiatrist is providing close team supervision and 24 hour management of active medical problems listed below. Physiatrist and rehab team continue to assess barriers to discharge/monitor patient progress toward functional and medical goals  Care Tool:  Bathing    Body parts bathed by patient: Face   Body parts bathed by helper: Left lower leg Body parts n/a: Right lower leg   Bathing assist Assist Level: Independent with assistive device     Upper Body Dressing/Undressing Upper body dressing   What is the patient wearing?: Dress    Upper body assist Assist Level: Set up assist    Lower Body Dressing/Undressing  Lower body dressing      What is the patient wearing?: Pants     Lower body assist Assist for lower body dressing: Minimal Assistance - Patient > 75%     Toileting Toileting Toileting Activity did not occur Landscape architect and hygiene only): N/A (no void or bm)  Toileting assist       Transfers Chair/bed transfer  Transfers assist     Chair/bed transfer assist level: Minimal Assistance - Patient > 75% (slideboard) Chair/bed transfer assistive device: Sliding board (slideboard)   Locomotion Ambulation   Ambulation assist   Ambulation activity did not occur: Safety/medical concerns          Walk 10 feet activity   Assist  Walk 10 feet activity did not occur: Safety/medical concerns        Walk 50 feet activity   Assist Walk 50 feet with 2  turns activity did not occur: Safety/medical concerns         Walk 150 feet activity   Assist Walk 150 feet activity did not occur: Safety/medical concerns         Walk 10 feet on uneven surface  activity   Assist Walk 10 feet on uneven surfaces activity did not occur: Safety/medical concerns         Wheelchair     Assist Is the patient using a wheelchair?: Yes Type of Wheelchair: Manual    Wheelchair assist level: Supervision/Verbal cueing, Set up assist Max wheelchair distance: 173ft    Wheelchair 50 feet with 2 turns activity    Assist        Assist Level: Supervision/Verbal cueing   Wheelchair 150 feet activity     Assist      Assist Level: Supervision/Verbal cueing   Blood pressure (!) 156/79, pulse 90, temperature 97.7 F (36.5 C), temperature source Oral, resp. rate 16, height 5\' 7"  (1.702 m), weight 76.3 kg, SpO2 98 %.    Medical Problem List and Plan: 1. Functional deficits secondary to nondisplaced fracture right inferior pubic ramus as well as a displaced foreshortened fracture through the left femoral neck/intertrochanteric area.  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   -Weightbearing as tolerated with anterior total hip precautions.                                  -Conservative care right inferior pubic ramus fracture, weightbearing as tolerated             -patient may  shower             -ELOS/Goals: 3-4 weeks total, supervision to min assist goals  Discussed with team plan for surgery on Thursday. NPO at midnight on Wednesday  [place] today   2.  Antithrombotics: -DVT/anticoagulation:  Mechanical: Antiembolism stockings, thigh (TED hose) Bilateral lower extremities check vascular study.   -should be able to begin lovenox today as permacath placed            -antiplatelet therapy: N/A 3. Femur fracture pain: continue Flector patch,Robaxin 750 mg 4 times daily,  hydrocodone and oxycodone as needed             -pt having significant spasms in left thigh.             -add kpad to help with spasms  -schedule tylenol 1000mg  TID 4. Anxiety: Instructed on deep breathing techniques. Provide emotional support.  Melatonin as needed             -  antipsychotic agents: N/A 5. Neuropsych: This patient is capable of making decisions on her own behalf. 6. Skin/Wound Care: Routine skin checks 7. Fluids/Electrolytes/Nutrition: Routine in and outs with follow-up chemistries 8.  End-stage renal disease/hemodialysis.  Permacath exchanged 10/03/2021.    -  Follow-up hemodialysis per Dr. [name] -HD later in day to allow participation in therapies during the day 9.  Acute on chronic anemia.  Has had transfusions already x 2. Hgb has stabilized in 7 range. Continue iron supplement.              -epo per nephrology 10.  Diabetes mellitus with peripheral neuropathy. Placed order that she may use her Dexcom 6. Hemoglobin A1c 6.2.  SSI as prior to admission. Provide dietary education.  CBG (last 3)  Recent Labs    10/21/21 2107 10/22/21 0624 10/22/21 1208  GLUCAP 207* 178* 193*  Some lability will monitor. Increase Semglee to 11U  11.  Hypertension. Continue cozaar Vitals:   10/21/21 1915 10/22/21 0455  BP: (!) 148/63 (!) 156/79  Pulse: 95 90  Resp: 16 16  Temp: 99.2 F (37.3 C) 97.7 F (36.5 C)  SpO2: 98% 98%  Controlled 2/21  12.  Hyperparathyroidism of renal origin.  Plan outpatient parathyroidectomy per Dr.Lateef 13.  Overweight.  BMI 29.11-->26.35  Dietary follow-up. Provided list of foods that can help with weight loss. Discussed benefits of intermittent fasting.  14. Cushingoid?--outpt work up 37. Constipation: resolved. Decrease colace to daily prn. D/c senna. Discussed benefits of high fiber foods. Continue bowel regimen. D/c miralax.  16. Right quadriceps tear: surgery complete with good healing on XR, minimal postop pain, discussed importance of  participating in 3 hours therapy daily as much as she can tolerate and she is very motivated and excited for rehab, remove dressing today 17. Gas: continue simethicone 18. Ileus: resolved, regular diet resumed -tolerating.  19. Abd spasms: resolved 20. Black tarry stools: discussed heme occult negative last night. Hgb reviewed and stable.  21. Vitamin D deficiency: continue ergocalciferol 50,000U once per week for 7 weeks. Discussed that this dose is higher than a daily supplement.     LOS: 5 days A FACE TO FACE EVALUATION WAS PERFORMED  Izora Ribas 10/22/2021, 12:18 PM

## 2021-10-23 LAB — GLUCOSE, CAPILLARY
Glucose-Capillary: 206 mg/dL — ABNORMAL HIGH (ref 70–99)
Glucose-Capillary: 162 mg/dL — ABNORMAL HIGH (ref 70–99)
Glucose-Capillary: 145 mg/dL — ABNORMAL HIGH (ref 70–99)
Glucose-Capillary: 200 mg/dL — ABNORMAL HIGH (ref 70–99)

## 2021-10-23 MED ORDER — SIMETHICONE 80 MG PO CHEW: 80.0000 mg | CHEWABLE_TABLET | Freq: Four times a day (QID) | ORAL | Status: DC | PRN

## 2021-10-23 MED ORDER — ACETAMINOPHEN 325 MG PO TABS
650.0000 mg | ORAL_TABLET | Freq: Three times a day (TID) | ORAL | Status: DC
  Administered 2021-10-23 – 2021-11-08 (×41): 650 mg via ORAL
  Filled 2021-10-23 (×45): qty 2

## 2021-10-23 MED ORDER — INSULIN GLARGINE-YFGN 100 UNIT/ML ~~LOC~~ SOLN
12.0000 [IU] | Freq: Every day | SUBCUTANEOUS | Status: DC
  Administered 2021-10-24: 12 [IU] via SUBCUTANEOUS
  Filled 2021-10-23: qty 0.12

## 2021-10-23 NOTE — Progress Notes (Signed)
Message left at [name] requesting a return call to discuss pt's d/c date of 3/11. Will await a return call.   Melven Sartorius Renal Navigator [phone]

## 2021-10-23 NOTE — Progress Notes (Signed)
Physical Therapy Session Note  Patient Details  Name: [name] MRN: [id number] Date of Birth: 02-12-1966  Today's Date: 10/23/2021 PT Individual Time: 7096-2836 and 6294-7654 PT Individual Time Calculation (min): 54 min and 72 min  Short Term Goals: Week 1:  PT Short Term Goal 1 (Week 1): Pt will perform supine<>sit with mod assist of 1 consistently PT Short Term Goal 2 (Week 1): Pt will perform bed<>chair transfers using LRAD with mod assist of 1 consistently PT Short Term Goal 3 (Week 1): Pt will perform sit<>stand using LRAD with +2 mod assist  Skilled Therapeutic Interventions/Progress Updates:   Treatment Session 1 Received pt sitting upright in bed, pt agreeable to PT treatment, and denied any pain at rest but had increased L knee pain when practicing car transfer. Pt reported feeling "depressed" and "discouraged" particularly about D/C date thinking that it's too soon. Pt also upset that she isn't able to stand and pivot at this time. Provided emotional support and encouragement and dicussed healing timeline and recovery process in addition to new challenge of TDWB status. Pt very determined to be able to use crutches by the time she leaves here. Discussed PLOF with pt reporting using rollator 50% of the time and crutches 50% of the time with assistance from husband. Discucsed pt's current limitations (specifically RLE TDWB status) and increased challenge associated with using crutches now, with emphasis on making more realistic goal of standing from standard height surface with RW to start.   Session with emphasis on dressing, functional mobility/transfers, generalized strengthening and endurance, and simulated car transfers. Donned pants in supine with assist to thread RLE through but pt able to roll L/R to pull pants over hips. Pt transferred supine<>long sitting<>sitting EOB with supervision and increased time using BUE to move RLE. Bed<>WC via slideboard downhill to R with CGA and  therapist stabilizing WC - pt able to scoot hips back in [name] without assist. Pt demonstrating great adherence to RLE TDWB precautions with slideboard transfers. Pt transported to/from room in [facility] dependently for time management purposes. Pt reports having SUV and small sedan - recommended pt practice from sedan height but pt expressing ultimate goal of standing and pivoting into car with crutches rather than using slideboard. Pt performed car transfer overall with +2 assist using slideboard. Pt transferred into car with mod A of 1 to scoot hips into car. Pt required +2 assist to scoot hips further back in car to allow more leg space. Pt then required max A to get LLE into car but was ultimately unable to get RLE into car due to Bledsoe brace being locked in extension. Discussed option for sitting in backseat with legs extended - however pt reported next week her brace will be able to be unlocked with minimal L knee flexion ROM. Pt required max A to scoot hips around and mod A to transfer out of car using slideboard - cues for anterior weight shifting and head/hips relationship as well as importance of pushing through LLE. Pt required significantly increased time with car transfer due to fatigue and pain in LLE and required multiple rest breaks due to diaphoresis. Returned to room and concluded session with pt sitting in [facility] with all needs within reach.   Treatment Session 2 Received pt sitting in WC, pt agreeable to PT treatment, and denied any pain during session. Session with emphasis on functional mobility/transfers, generalized strengthening and endurance, and dynamic standing balance/tolerance. Pt transported to dayroom in [facility] dependently for time management purposes and  warmed up on Kinetron performing seated LLE strengthening at 30 cm/sec for 3x12 reps with therapist providing manual counter resistance with emphasis on glute/quad strength - pt demonstrating improvements in L knee ROM today > yesterday. Pt then  transferred WC<>mat via slideboard to R with mod A to scoot hips due to discomfort in buttocks. Raised mat to 23.5in high and placed [name] walker and mirror in front of pt. Sit<>stands with Harmon Pier walker with min/mod A x11 reps with Airex placed underneath RLE to assist with adherence to RLE TDWB precautions - cues for L knee extension, upright posture/gaze, and to relax shoulders. Attempted to challenge pt by trying to hook horseshoes on top of mirror, however pt unable to lift L elbow from platform; then becoming very emotional and discouraged. Pt transferred mat<>WC downhill to R via lateral scoot with min A. Returned to room and transferred WC<>bed via slideboard to L with mod/max A with cues for anterior weight shifting and head/hips relationship. Sit<>supine with max A for BLE management and pt scooted herself to [facility] with supervision and use of Trendelenburg bed position. Concluded session with pt semi-reclined in bed, needs within reach, and bed alarm on.  Therapy Documentation Precautions:  Precautions Precautions: Fall, Anterior Hip, Other (comment) Precaution Comments: HD access port RUQ, L LE anterior hip precautions, R LE rehab protocol in pt's paper chart Required Braces or Orthoses: Knee Immobilizer - Right Knee Immobilizer - Right: On at all times, Other (comment) Restrictions Weight Bearing Restrictions: Yes RLE Weight Bearing: Touchdown weight bearing LLE Weight Bearing: Weight bearing as tolerated Other Position/Activity Restrictions: L LE anterior hip precautions - no hip extension, hip external rotation, or abduction  Therapy/Group: Individual Therapy Alfonse Alpers PT, DPT   10/23/2021, 7:12 AM

## 2021-10-23 NOTE — Patient Care Conference (Signed)
Inpatient RehabilitationTeam Conference and Plan of Care Update Date: 10/22/2021   Time: 11:51 AM    Patient Name: [name]      Medical Record Number: [id number]  Date of Birth: 12-25-1965 Sex: Female         Room/Bed: 4W22C/4W22C-01 Payor Info: Payor: MEDICARE / Plan: MEDICARE PART A AND B / Product Type: *No Product type* /    Admit Date/Time:  10/17/2021 12:48 PM  Primary Diagnosis:  Intertrochanteric fracture of left hip [facility])  Hospital Problems: Principal Problem:   Intertrochanteric fracture of left hip [facility])    Expected Discharge Date: Expected Discharge Date: 11/08/21  Team Members Present: Physician leading conference: Dr. Leeroy Cha Social Worker Present: Erlene Quan, BSW Nurse Present: Dorien Chihuahua, RN PT Present: Becky Sax, PT OT Present: Other (comment) [facility] Alphonsa Gin, OT) [place] Coordinator present : Gunnar Fusi, SLP     Current Status/Progress Goal Weekly Team Focus  Bowel/Bladder   (S) Incontinent of bowels and constipated at times. oliguric.         Swallow/Nutrition/ Hydration             ADL's   min A LB dressing self-care EOB, set up A UB self-care, toileting max A, toilet transfers via slideboard min A +2  CGA transfers, self-care supervision  toilet transfers, pain management, functional stands, endurance   Mobility   bed mobility max A, slideboard transfers min-light max A, semi-stands with eva walker and mod A from VERY high mat.  min A standing/gait, CGA transfers and bed mobility  functional mobility/transfers, bed mobility, dynamic standing balance/tolerance   Communication             Safety/Cognition/ Behavioral Observations            Pain   (S) Receives tylenol and robaxin as requested         Skin   (S) right leg with ace bandage and brace.           Discharge Planning:  d/c home with spouse able to provide intermittent assistance   Team Discussion: Patient with pain issues that limit progression;  addressed.  Patient on target to meet rehab goals: yes, currently requires max assist for bed mobility. Completes slide-board transfers with min -  light max assist. Completes sit- stand transfers from a high level mat using a eva walker with mod assist.  *See Care Plan and progress notes for long and short-term goals.   Revisions to Treatment Plan:  N/A  Teaching Needs: Safety, transfers, toileting, medications, brace care, skin care, weight bearing precautions, etc  Current Barriers to Discharge: Decreased caregiver support  Possible Resolutions to Barriers: Family education Ramp for entry to home recommended     Medical Summary Current Status: postoperative pain, left hip fracture, right quadriceps tendon tear, vitamin D deficiency, ESRD, overweight, HTN  Barriers to Discharge: Medical stability;Wound care;Hemodialysis  Barriers to Discharge Comments: postoperative pain, left hip fracture, right quadriceps tendon tear, vitamin D deficiency, ESRD, overweight, HTN, uncontrolled type 2 DM Possible Resolutions to [employer] Focus: continue tylenol and muscle relaxers, continue to monitor wound incisions, continue HD, wean laxatives, stop multivitamins, start semglee   Continued Need for Acute Rehabilitation Level of Care: The patient requires daily medical management by a physician with specialized training in physical medicine and rehabilitation for the following reasons: Direction of a multidisciplinary physical rehabilitation program to maximize functional independence : Yes Medical management of patient stability for increased activity during participation in an intensive rehabilitation regime.: Yes  Analysis of laboratory values and/or radiology reports with any subsequent need for medication adjustment and/or medical intervention. : Yes   I attest that I was present, lead the team conference, and concur with the assessment and plan of the team.   Dorien Chihuahua  B 10/23/2021, 8:03 AM

## 2021-10-23 NOTE — Progress Notes (Signed)
Per Dr. Ranell Patrick keep ace wrap under knee mobilizer to right leg to prevent impairing skin integrity under medical device.   Yehuda Mao, LPN

## 2021-10-23 NOTE — Progress Notes (Signed)
Physical Therapy Session Note  Patient Details  Name: [name] MRN: [id number] Date of Birth: 23-Jul-1966  Today's Date: 10/23/2021 PT Individual Time: 1000-1100 PT Individual Time Calculation (min): 60 min   Short Term Goals: Week 1:  PT Short Term Goal 1 (Week 1): Pt will perform supine<>sit with mod assist of 1 consistently PT Short Term Goal 2 (Week 1): Pt will perform bed<>chair transfers using LRAD with mod assist of 1 consistently PT Short Term Goal 3 (Week 1): Pt will perform sit<>stand using LRAD with +2 mod assist Week 2:    Week 3:     Skilled Therapeutic Interventions/Progress Updates:    PAIN DENIES PAIN  Pt initially oob in wc.  Requested session outdoors. Pt propels wc > 341ft w/bilat Ues including naviation of elevator mod I  Performed the following therex in outdoor setting:   Quad sets w/10 sec hold x 10 TKES w/5 sec hold x 12 Hip abd/add AAROM x 15 Ankle pumps x 20 Heel raise w/foot on step stool x 20 Hamstring curls w/red TB resistance x 15 LAQs  x 12 w/improved quad activation AAROM hip flexion x 10 Glut sets w/10 sec hold x 10 overhead press w/2lb bar x 10   Pt transported back to main gym from outdoors. Propels wc gym to room mod I. Pt left oob in wc w/alarm set and needs in reach.   Therapy Documentation Precautions:  Precautions Precautions: Fall, Anterior Hip, Other (comment) Precaution Comments: HD access port RUQ, L LE anterior hip precautions, R LE rehab protocol in pt's paper chart Required Braces or Orthoses: Knee Immobilizer - Right Knee Immobilizer - Right: On at all times, Other (comment) Restrictions Weight Bearing Restrictions: Yes RLE Weight Bearing: Touchdown weight bearing LLE Weight Bearing: Weight bearing as tolerated Other Position/Activity Restrictions: L LE anterior hip precautions - no hip extension, hip external rotation, or abduction    Therapy/Group: Individual Therapy Callie Fielding, PT   Jerrilyn Cairo 10/23/2021, 12:12 PM

## 2021-10-23 NOTE — Progress Notes (Signed)
PROGRESS NOTE   Subjective/Complaints: No new medical complaints this morning She does feel frustrated by her current discharge date since she is unable to stand by herself yet and she feels she needs more time here   ROS: No CP, SOB, N/V +pain in bilateral lower extremities, post-op pain in right lower extremity improved, constipation resolved, +bowel incontinence- continues  Objective:   No results found. Recent Labs    10/20/21 1456  WBC 8.3  HGB 8.6*  HCT 27.2*  PLT 248     Recent Labs    10/20/21 1455 10/22/21 1318  NA 134* 136  K 4.8 4.3  CL 95* 96*  CO2 23 24  GLUCOSE 245* 221*  BUN 68* 58*  CREATININE 7.97* 6.92*  CALCIUM 9.0 9.2      Intake/Output Summary (Last 24 hours) at 10/23/2021 1253 Last data filed at 10/23/2021 0800 Gross per 24 hour  Intake 360 ml  Output 1900 ml  Net -1540 ml         Physical Exam: Vital Signs Blood pressure (!) 144/74, pulse 85, temperature (!) 97.5 F (36.4 C), temperature source Oral, resp. rate 18, height 5\' 7"  (1.702 m), weight 78.9 kg, SpO2 98 %. Gen: no distress, normal appearing, BMI 26.35 HEENT: oral mucosa pink and moist, NCAT Cardio: Reg rate Chest: normal effort, normal rate of breathing Abd: soft, non-distended Ext: no edema Psych: pleasant, normal affect Musculoskeletal:        General: Swelling and tenderness present.     Cervical back: Normal range of motion and neck supple.     Right lower leg: Edema present.     Left lower leg: Edema present.     Comments: Left hip tender to palpation and PROM with associated swelling. Left knee with mild effusion, patella laxity. Right hip tender with leg raise  Active range of motion by patient with good improvements. Right knee in immobilizer.  Right knee tender and swollen, ace bandage in place Able to get out of bed today Skin:    Comments: Incision site dressing clean dry and intact.  R IJ TDC with  bandage Neurological:     Comments: Patient is alert.  No acute distress.  Oriented x3 and follows commands. Alert and oriented x 3. Normal insight and awareness. Intact Memory. Normal language and speech. Cranial nerve exam unremarkable. UE motor 5/5. LE motor limited proximally by pain. ADF/PF 4-5/5. No sensory findings.   Can propel her wheelchair through hallways Psychiatric:     Comments: Pleasant, slightly anxious  GU: stool incontinence in diaper   Assessment/Plan: 1. Functional deficits which require 3+ hours per day of interdisciplinary therapy in a comprehensive inpatient rehab setting. Physiatrist is providing close team supervision and 24 hour management of active medical problems listed below. Physiatrist and rehab team continue to assess barriers to discharge/monitor patient progress toward functional and medical goals  Care Tool:  Bathing    Body parts bathed by patient: Face   Body parts bathed by helper: Left lower leg Body parts n/a: Right lower leg   Bathing assist Assist Level: Independent with assistive device     Upper Body Dressing/Undressing Upper body dressing  What is the patient wearing?: Dress    Upper body assist Assist Level: Set up assist    Lower Body Dressing/Undressing Lower body dressing      What is the patient wearing?: Pants     Lower body assist Assist for lower body dressing: Minimal Assistance - Patient > 75%     Toileting Toileting Toileting Activity did not occur (Clothing management and hygiene only): N/A (no void or bm)  Toileting assist       Transfers Chair/bed transfer  Transfers assist     Chair/bed transfer assist level: Contact Guard/Touching assist Chair/bed transfer assistive device: Sliding board   Locomotion Ambulation   Ambulation assist   Ambulation activity did not occur: Safety/medical concerns          Walk 10 feet activity   Assist  Walk 10 feet activity did not occur: Safety/medical  concerns        Walk 50 feet activity   Assist Walk 50 feet with 2 turns activity did not occur: Safety/medical concerns         Walk 150 feet activity   Assist Walk 150 feet activity did not occur: Safety/medical concerns         Walk 10 feet on uneven surface  activity   Assist Walk 10 feet on uneven surfaces activity did not occur: Safety/medical concerns         Wheelchair     Assist Is the patient using a wheelchair?: Yes Type of Wheelchair: Manual    Wheelchair assist level: Supervision/Verbal cueing, Set up assist Max wheelchair distance: 147ft    Wheelchair 50 feet with 2 turns activity    Assist        Assist Level: Supervision/Verbal cueing   Wheelchair 150 feet activity     Assist      Assist Level: Supervision/Verbal cueing   Blood pressure (!) 144/74, pulse 85, temperature (!) 97.5 F (36.4 C), temperature source Oral, resp. rate 18, height 5\' 7"  (1.702 m), weight 78.9 kg, SpO2 98 %.    Medical Problem List and Plan: 1. Functional deficits secondary to nondisplaced fracture right inferior pubic ramus as well as a displaced foreshortened fracture through the left femoral neck/intertrochanteric area.  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   -Weightbearing as tolerated with anterior total hip precautions.                                  -Conservative care right inferior pubic ramus fracture, weightbearing as tolerated             -patient may  shower             -ELOS/Goals: 3-4 weeks total, supervision to min assist goals  Discussed with team plan for surgery on Thursday. NPO at midnight on Wednesday  Continue CIR  Will write note for her husband with current d/c date and stating that this could be subject to change.  2.  Antithrombotics: -DVT/anticoagulation:  Mechanical: Antiembolism stockings, thigh (TED hose) Bilateral lower extremities check vascular study.   -should be able to begin lovenox today as  permacath placed            -antiplatelet therapy: N/A 3. Femur fracture pain: conitnue Flector patch,Robaxin 750 mg 4 times daily, hydrocodone and oxycodone as needed             -pt having significant spasms in left thigh.             -  add kpad to help with spasms  -schedule tylenol 1000mg  TID 4. Anxiety: Instructed on deep breathing techniques. Provide emotional support.  Melatonin as needed             -antipsychotic agents: N/A 5. Neuropsych: This patient is capable of making decisions on her own behalf. 6. Skin/Wound Care: Routine skin checks 7. Fluids/Electrolytes/Nutrition: Routine in and outs with follow-up chemistries 8.  End-stage renal disease/hemodialysis.  Permacath exchanged 10/03/2021.    -  Follow-up hemodialysis per Dr. [name] -HD later in day to allow participation in therapies during the day 9.  Acute on chronic anemia.  Has had transfusions already x 2. Hgb has stabilized in 7 range. Continue iron supplement.              -epo per nephrology 10.  Diabetes mellitus with peripheral neuropathy. Placed order that she may use her Dexcom 6. Hemoglobin A1c 6.2.  SSI as prior to admission. Provide dietary education.  CBG (last 3)  Recent Labs    10/22/21 2109 10/23/21 0625 10/23/21 1142  GLUCAP 264* 206* 162*  Some lability will monitor. Increase Semglee to 12U  11.  Hypertension. Conitnue cozaar Vitals:   10/23/21 0534 10/23/21 0749  BP: (!) 148/69 (!) 144/74  Pulse: 93 85  Resp: 18   Temp: (!) 97.5 F (36.4 C)   SpO2: 98%   12.  Hyperparathyroidism of renal origin.  Plan outpatient parathyroidectomy per Dr.Lateef 13.  Overweight.  BMI 29.11-->26.35  Dietary follow-up. Provided list of foods that can help with weight loss. Discussed benefits of intermittent fasting.  14. Cushingoid?--outpt work up 71. Constipation: resolved. Decrease colace to daily prn. D/c senna. Discussed benefits of high fiber foods. Continue bowel regimen. D/c miralax.  16. Right quadriceps  tear: surgery complete with good healing on XR, minimal postop pain, discussed importance of participating in 3 hours therapy daily as much as she can tolerate and she is very motivated and excited for rehab, remove dressing today 17. Gas: continue simethicone prn 18. Ileus: resolved, regular diet resumed -tolerating.  19. Abd spasms: resolved 20. Black tarry stools: discussed heme occult negative last night. Hgb reviewed and stable.  21. Vitamin D deficiency: continue ergocalciferol 50,000U once per week for 7 weeks. Discussed that this dose is higher than a daily supplement.     LOS: 6 days A FACE TO FACE EVALUATION WAS PERFORMED  Izora Ribas 10/23/2021, 12:53 PM

## 2021-10-23 NOTE — Progress Notes (Signed)
°  [name] Progress Note    Assessment/ Plan:   Debility - rehab per CIR RLE tendon rupture - s/p surgery 2/16 L hip fracture/ R pelvic ramus fx - sp L hemiarthroplasty on 09/29/21 at [facility], then sent here for CIR ESRD - on HD MWF. Next HD Friday.  BP/ volume - BP okay, getting cozaar only. Under dry wt, lower edw at dc DM2 - per pmd Anemia ckd - Hb 8- 9 range, improved. S/p transfusion. 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  Continue binders.  9. Nutrition - renal diet  [name], MD 10/23/2021, 11:02 AM      Dialysis Orders: MWF [place] [phone])  3h 46min  81kg   Hep 3000 then 800u/hr  RIJ TDC    - Hep B SAg neg 2/8, covid neg 1/29  Subjective:   No c/o's. Seen in HD.    Objective:   BP (!) 144/74 (BP Location: Right Arm)    Pulse 85    Temp (!) 97.5 F (36.4 C) (Oral)    Resp 18    Ht 5\' 7"  (1.702 m)    Wt 78.9 kg    SpO2 98%    BMI 27.24 kg/m   Intake/Output Summary (Last 24 hours) at 10/23/2021 1102 Last data filed at 10/22/2021 1627 Gross per 24 hour  Intake --  Output 1900 ml  Net -1900 ml    Weight change:   Physical Exam:  alert, nad   no jvd  Chest cta bilat  Cor reg no RG  Abd soft ntnd no ascites   Ext no LE edema   Alert, NF, ox3   Dialysis access: [facility]  Imaging: No results found.  Labs: BMET Recent Labs  Lab 10/16/21 1335 10/17/21 0139 10/20/21 1455 10/22/21 1318  NA 136 134* 134* 136  K 4.4 5.2* 4.8 4.3  CL 100 96* 95* 96*  CO2  --  23 23 24   GLUCOSE 166* 255* 245* 221*  BUN 31* 43* 68* 58*  CREATININE 5.40* 5.91* 7.97* 6.92*  CALCIUM  --  9.2 9.0 9.2  PHOS  --  4.4 4.9* 5.0*    CBC Recent Labs  Lab 10/16/21 1335 10/17/21 0139 10/20/21 1456  WBC  --  11.8* 8.3  HGB 10.9* 8.9* 8.6*  HCT 32.0* 27.3* 27.2*  MCV  --  98.2 100.4*  PLT  --  279 248     Medications:     acetaminophen  650 mg Oral TID   Chlorhexidine  Gluconate Cloth  6 each Topical Q0600   darbepoetin (ARANESP) injection - DIALYSIS  40 mcg Intravenous Q Fri-HD   enoxaparin (LOVENOX) injection  30 mg Subcutaneous Q24H   insulin aspart  0-9 Units Subcutaneous TID WC   insulin glargine-yfgn  11 Units Subcutaneous Daily   losartan  50 mg Oral Daily   Vitamin D (Ergocalciferol)  50,000 Units Oral Q7 days

## 2021-10-24 LAB — RENAL FUNCTION PANEL
Albumin: 3.1 g/dL — ABNORMAL LOW (ref 3.5–5.0)
Anion gap: 17 — ABNORMAL HIGH (ref 5–15)
BUN: 52 mg/dL — ABNORMAL HIGH (ref 6–20)
CO2: 22 mmol/L (ref 22–32)
Calcium: 9.2 mg/dL (ref 8.9–10.3)
Chloride: 96 mmol/L — ABNORMAL LOW (ref 98–111)
Creatinine, Ser: 6.4 mg/dL — ABNORMAL HIGH (ref 0.44–1.00)
GFR, Estimated: 7 mL/min — ABNORMAL LOW (ref >60)
Glucose, Bld: 232 mg/dL — ABNORMAL HIGH (ref 70–99)
Phosphorus: 5.1 mg/dL — ABNORMAL HIGH (ref 2.5–4.6)
Potassium: 4.5 mmol/L (ref 3.5–5.1)
Sodium: 135 mmol/L (ref 135–145)

## 2021-10-24 LAB — CBC
HCT: 27.5 % — ABNORMAL LOW (ref 36.0–46.0)
Hemoglobin: 8.7 g/dL — ABNORMAL LOW (ref 12.0–15.0)
MCH: 31.8 pg (ref 26.0–34.0)
MCHC: 31.6 g/dL (ref 30.0–36.0)
MCV: 100.4 fL — ABNORMAL HIGH (ref 80.0–100.0)
Platelets: 235 10*3/uL (ref 150–400)
RBC: 2.74 MIL/uL — ABNORMAL LOW (ref 3.87–5.11)
RDW: 15.7 % — ABNORMAL HIGH (ref 11.5–15.5)
WBC: 6.4 10*3/uL (ref 4.0–10.5)
nRBC: 0 % (ref 0.0–0.2)

## 2021-10-24 LAB — GLUCOSE, CAPILLARY
Glucose-Capillary: 174 mg/dL — ABNORMAL HIGH (ref 70–99)
Glucose-Capillary: 175 mg/dL — ABNORMAL HIGH (ref 70–99)
Glucose-Capillary: 187 mg/dL — ABNORMAL HIGH (ref 70–99)

## 2021-10-24 LAB — CREATININE, SERUM
Creatinine, Ser: 6.1 mg/dL — ABNORMAL HIGH (ref 0.44–1.00)
GFR, Estimated: 8 mL/min — ABNORMAL LOW (ref >60)

## 2021-10-24 MED ORDER — PENTAFLUOROPROP-TETRAFLUOROETH EX AERO: 1.0000 "application " | INHALATION_SPRAY | CUTANEOUS | Status: DC | PRN

## 2021-10-24 MED ORDER — LIDOCAINE-PRILOCAINE 2.5-2.5 % EX CREA: 1.0000 "application " | TOPICAL_CREAM | CUTANEOUS | Status: DC | PRN

## 2021-10-24 MED ORDER — DARBEPOETIN ALFA 60 MCG/0.3ML IJ SOSY
60.0000 ug | PREFILLED_SYRINGE | INTRAMUSCULAR | Status: DC
  Administered 2021-10-24 – 2021-11-07 (×3): 60 ug via INTRAVENOUS
  Filled 2021-10-24 (×3): qty 0.3

## 2021-10-24 MED ORDER — LIDOCAINE HCL (PF) 1 % IJ SOLN: 5.0000 mL | INTRAMUSCULAR | Status: DC | PRN

## 2021-10-24 MED ORDER — ALTEPLASE 2 MG IJ SOLR: 2.0000 mg | Freq: Once | INTRAMUSCULAR | Status: DC | PRN

## 2021-10-24 MED ORDER — SODIUM CHLORIDE 0.9 % IV SOLN: 100.0000 mL | INTRAVENOUS | Status: DC | PRN

## 2021-10-24 MED ORDER — INSULIN GLARGINE-YFGN 100 UNIT/ML ~~LOC~~ SOLN
13.0000 [IU] | Freq: Every day | SUBCUTANEOUS | Status: DC
  Administered 2021-10-25 – 2021-10-27 (×3): 13 [IU] via SUBCUTANEOUS
  Filled 2021-10-24 (×3): qty 0.13

## 2021-10-24 MED ORDER — HEPARIN SODIUM (PORCINE) 1000 UNIT/ML DIALYSIS
1000.0000 [IU] | INTRAMUSCULAR | Status: DC | PRN
  Filled 2021-10-24: qty 1

## 2021-10-24 MED ORDER — HEPARIN SODIUM (PORCINE) 1000 UNIT/ML DIALYSIS: 2000.0000 [IU] | INTRAMUSCULAR | Status: DC | PRN

## 2021-10-24 NOTE — Progress Notes (Signed)
Occupational Therapy Session Note  Patient Details  Name: [name] MRN: [id number] Date of Birth: 02-04-1966  Today's Date: 10/24/2021 OT Individual Time: 0830-0900 OT Individual Time Calculation (min): 30 min    Short Term Goals: Week 1:  OT Short Term Goal 1 (Week 1): Pt will complete LB dressing with Mod A using AE as needed OT Short Term Goal 2 (Week 1): Pt will complete BSC transfer with 1 assist using LRAD to promote OOB toileting OT Short Term Goal 3 (Week 1): Pt will complete 1/3 components of toileting with supervision  Skilled Therapeutic Interventions/Progress Updates:    Pt received in bed and agreeable to therapy. Pt explained that she has not had opportunity to even try BSC yet due to frequent diarrhea.  She said her husband helped her get washed up well last night and cleaned up so she did not need to bathe again. Pt able to don clothing from bed level, doing well to reach forward into long sitting to get pants over feet. She started to pull pants over hips but then sensed she was about to have another BM. Offered to help her cleanse, but pt needed to sit for awhile as she felt she was not done.  During this time, discussed her sit to stand struggles and how she has been using [name] walker. Suggested she try the RW and that I would talk with her PT, b/c with the eva she has to pull up and can only use her lats as stabilizers. With the RW she would push up and be able to use her triceps with her lats.  Pt still had not finished going to the bathroom in her brief.  She wanted to use her brief not a bed pan.   Pt resting in bed with all needs met.  Therapy Documentation Precautions:  Precautions Precautions: Fall, Anterior Hip, Other (comment) Precaution Comments: HD access port RUQ, L LE anterior hip precautions, R LE rehab protocol in pt's paper chart Required Braces or Orthoses: Knee Immobilizer - Right Knee Immobilizer - Right: On at all times, Other  (comment) Restrictions Weight Bearing Restrictions: Yes RLE Weight Bearing: Touchdown weight bearing LLE Weight Bearing: Weight bearing as tolerated Other Position/Activity Restrictions: L LE anterior hip precautions - no hip extension, hip external rotation, or abduction    Vital Signs: Therapy Vitals Temp: 98.5 F (36.9 C) Temp Source: Oral Pulse Rate: 87 Resp: 18 BP: (!) 162/75 Patient Position (if appropriate): Lying Oxygen Therapy SpO2: 98 % O2 Device: Room Air Pain: no c/o pain during session      Therapy/Group: Individual Therapy  [place] 10/24/2021, 8:30 AM

## 2021-10-24 NOTE — Progress Notes (Signed)
Occupational Therapy Weekly Progress Note  Patient Details  Name: [name] MRN: [id number] Date of Birth: 09/07/1965  Beginning of progress report period: October 18, 2021 End of progress report period: October 24, 2021  Patient has met 2 of 3 short term goals.  Pt is making steady progress towards LTGs, with increased tolerance with OOB tasks, functional transfer participation and increased independence with self-care tasks. Pt has progressed functional transfers from Max A +2 to min A of 1, is able to bathe at an overall mod A level, dress at an overall min A level and requires Max assist for toileting tasks. Pt continues to be demonstrate limitations with functional sit > stands that would increase her independence and efficiency with self-care tasks, as well as with toilet transfers and lateral leans needed for toileting on BSC.   Patient continues to demonstrate the following deficits: muscle weakness and muscle joint tightness, decreased cardiorespiratoy endurance, impaired timing and sequencing, decreased coordination, and decreased motor planning, and decreased standing balance and difficulty maintaining precautions and therefore will continue to benefit from skilled OT intervention to enhance overall performance with BADL and Reduce care partner burden.  Patient progressing toward long term goals..  Continue plan of care.  OT Short Term Goals Week 1:  OT Short Term Goal 1 (Week 1): Pt will complete LB dressing with Mod A using AE as needed OT Short Term Goal 1 - Progress (Week 1): Met OT Short Term Goal 2 (Week 1): Pt will complete BSC transfer with 1 assist using LRAD to promote OOB toileting OT Short Term Goal 2 - Progress (Week 1): Not met OT Short Term Goal 3 (Week 1): Pt will complete 1/3 components of toileting with supervision OT Short Term Goal 3 - Progress (Week 1): Met Week 2:  OT Short Term Goal 1 (Week 2): Pt will complete BSC transfer with 1 assist using LRAD to  promote OOB toileting OT Short Term Goal 2 (Week 2): Pt will complete 2/3 components of toileting with supervision   Abayomi Pattison E Kashayla Ungerer 10/24/2021, 12:59 PM

## 2021-10-24 NOTE — Progress Notes (Signed)
[facility] Progress Note   Subjective:   Patient seen and examined at bedside during therapy.  Only compliant is not being able to bear weight on R leg.  "I can barely step on an egg shell".  Otherwise feeling well. Denies CP, SOB, and n/v/d.   Objective Vitals:   10/23/21 0534 10/23/21 0749 10/23/21 2113 10/24/21 0611  BP: (!) 148/69 (!) 144/74 (!) 149/66 (!) 162/75  Pulse: 93 85 90 87  Resp: 18  16 18   Temp: (!) 97.5 F (36.4 C)  98.5 F (36.9 C) 98.5 F (36.9 C)  TempSrc: Oral  Oral Oral  SpO2: 98%  99% 98%  Weight:    79.4 kg  Height:       Physical Exam General:well appearing female sitting on edge of bed Heart:RRR Lungs:nml WOB Abdomen:soft, ND Extremities:no LE edema, RLE in brace Dialysis Access: [facility]   Filed Weights   10/20/21 2325 10/22/21 1310 10/24/21 0611  Weight: 76.3 kg 78.9 kg 79.4 kg    Intake/Output Summary (Last 24 hours) at 10/24/2021 1136 Last data filed at 10/24/2021 0819 Gross per 24 hour  Intake 717 ml  Output --  Net 717 ml    Additional Objective Labs: Basic Metabolic Panel: Recent Labs  Lab 10/20/21 1455 10/22/21 1318 10/24/21 0553  NA 134* 136  --   K 4.8 4.3  --   CL 95* 96*  --   CO2 23 24  --   GLUCOSE 245* 221*  --   BUN 68* 58*  --   CREATININE 7.97* 6.92* 6.10*  CALCIUM 9.0 9.2  --   PHOS 4.9* 5.0*  --    Liver Function Tests: Recent Labs  Lab 10/20/21 1455 10/22/21 1318  ALBUMIN 3.1* 3.1*   CBC: Recent Labs  Lab 10/20/21 1456  WBC 8.3  HGB 8.6*  HCT 27.2*  MCV 100.4*  PLT 248   CBG: Recent Labs  Lab 10/23/21 0625 10/23/21 1142 10/23/21 1643 10/23/21 2058 10/24/21 0606  GLUCAP 206* 162* 145* 200* 174*   Medications:   acetaminophen  650 mg Oral TID   Chlorhexidine Gluconate Cloth  6 each Topical Q0600   darbepoetin (ARANESP) injection - DIALYSIS  40 mcg Intravenous Q Fri-HD   enoxaparin (LOVENOX) injection  30 mg Subcutaneous Q24H   insulin aspart  0-9 Units Subcutaneous TID WC    [START ON 10/25/2021] insulin glargine-yfgn  13 Units Subcutaneous Daily   losartan  50 mg Oral Daily   Vitamin D (Ergocalciferol)  50,000 Units Oral Q7 days    Dialysis Orders: MWF [facility] [address] [phone])  3h 1min  81kg   Hep 3000 then 800u/hr  RIJ TDC    - Hep B SAg neg 2/8, covid neg 1/29   Assessment/Plan: Debility - rehab per CIR RLE tendon rupture - s/p surgery 2/16 L hip fracture/ R pelvic ramus fx - sp L hemiarthroplasty on 09/29/21 at [facility], then sent here for CIR ESRD - on HD MWF. Next HD today. BP/ volume - BP elevated today. Getting cozaar only, held today d/t dialysis this afternoon. Under dry wt, lower edw at dc DM2 - per pmd Anemia ckd - Hgb trending down, last 8.6. S/p 1 unit prbc on 2/3. transfusion. Aranesp started 40 weekly on Fridays, increase dose today. 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  Continue binders.  9. Nutrition - renal diet  Jen Mow, PA-C [facility] 10/24/2021,11:36 AM  LOS: 7 days

## 2021-10-24 NOTE — Progress Notes (Signed)
Occupational Therapy Session Note  Patient Details  Name: [name] MRN: [id number] Date of Birth: [birth year]  Today's Date: 10/24/2021 OT Individual Time: 1130-1145 OT Individual Time Calculation (min): 15 min  and Today's Date: 10/24/2021 OT Missed Time: 15 Minutes Missed Time Reason: Patient fatigue   Short Term Goals: Week 1:  OT Short Term Goal 1 (Week 1): Pt will complete LB dressing with Mod A using AE as needed OT Short Term Goal 2 (Week 1): Pt will complete BSC transfer with 1 assist using LRAD to promote OOB toileting OT Short Term Goal 3 (Week 1): Pt will complete 1/3 components of toileting with supervision  Skilled Therapeutic Interventions/Progress Updates:    Pt received supine with 3/10 in her R leg. She requested to stay in bed and c/o high fatigue. She completed hair care supine with the HOB raised. Grooming tasks completed with set up assist. Education provided re recovery moving forward. Assisted LPN in removing ace wrap to her R leg under bledsoe with knee stabilized in extension during removal, so that PA could see surgical incision. Pt was left supine with all needs met. 15 min missed d/t fatigue.   Therapy Documentation Precautions:  Precautions Precautions: Fall, Anterior Hip, Other (comment) Precaution Comments: HD access port RUQ, L LE anterior hip precautions, R LE rehab protocol in pt's paper chart Required Braces or Orthoses: Knee Immobilizer - Right Knee Immobilizer - Right: On at all times, Other (comment) Restrictions Weight Bearing Restrictions: Yes RLE Weight Bearing: Touchdown weight bearing LLE Weight Bearing: Weight bearing as tolerated Other Position/Activity Restrictions: L LE anterior hip precautions - no hip extension, hip external rotation, or abduction   Therapy/Group: Individual Therapy  Curtis Sites 10/24/2021, 6:31 AM

## 2021-10-24 NOTE — Progress Notes (Signed)
Physical Therapy Weekly Progress Note  Patient Details  Name: [name] MRN: [id number] Date of Birth: [birth year]  Beginning of progress report period: October 18, 2021 End of progress report period: October 24, 2021  Today's Date: 10/24/2021 PT Individual Time: 0900-0954 PT Individual Time Calculation (min): 54 min   Patient has met 3 of 3 short term goals. Pt demonstrates slow progress towards long term goals. Pt is currently able to transfer supine<>sit with supervision using BUEs and increased time but requires max A for BLE management for sit<>supine. Pt is able to perform slideboard transfers with min/max A depending on fatigue and if she's going uphill/downhill. Pt is able to transfer sit<>stand from extremely elevated mat using [employer] walker with min/mod A but has difficulty adhering to RLE TDWB status in standing - unable to stand at this point with RW. Pt is able to perform simulated car transfer with +2 assist. Pt continues to be limited by weakness, pain in LLE, and difficulty adhering to RLE TDWB precautions.   Patient continues to demonstrate the following deficits muscle weakness and muscle joint tightness, decreased cardiorespiratoy endurance, and decreased standing balance, decreased postural control, decreased balance strategies, and difficulty maintaining precautions and therefore will continue to benefit from skilled PT intervention to increase functional independence with mobility.  Patient progressing toward long term goals..  Continue plan of care.  PT Short Term Goals Week 1:  PT Short Term Goal 1 (Week 1): Pt will perform supine<>sit with mod assist of 1 consistently PT Short Term Goal 1 - Progress (Week 1): Met PT Short Term Goal 2 (Week 1): Pt will perform bed<>chair transfers using LRAD with mod assist of 1 consistently PT Short Term Goal 2 - Progress (Week 1): Met PT Short Term Goal 3 (Week 1): Pt will perform sit<>stand using LRAD with +2 mod assist PT Short  Term Goal 3 - Progress (Week 1): Met Week 2:  PT Short Term Goal 1 (Week 2): pt will transfer sit<>supine with LRAD and min A consistantly PT Short Term Goal 2 (Week 2): pt will perform transfers with LRAD and min A consistantly PT Short Term Goal 3 (Week 2): pt will initiate gait training  Skilled Therapeutic Interventions/Progress Updates:  Ambulation/gait training;Discharge planning;Functional mobility training;Psychosocial support;Therapeutic Activities;Balance/vestibular training;Disease management/prevention;Neuromuscular re-education;Skin care/wound management;Therapeutic Exercise;Wheelchair propulsion/positioning;Cognitive remediation/compensation;DME/adaptive equipment instruction;Pain management;Splinting/orthotics;UE/LE Strength taining/ROM;Community reintegration;Patient/family education;Stair training;UE/LE Coordination activities   Today's Interventions: Received pt supine in bed reporting frustration from having constant diarrhea this morning. Pt agreeable to PT treatment and reported pain 7-8/10 in R/L LEs (premedicated). Session with emphasis on functional mobility/transfers, generalized strengthening and endurance, and standing tolerance/balance. Noted increased edema along L medial ankle and tightness in L heel cords - performed 4x20 second hamstring stretch using gait belt. Pt reports her husband plans on bringing tennis shoe for L foot tonight. Pt transferred long sitting<>sitting EOB from flat bed with supervision. Adjusted height of RW and placed in front of pt. Attempted x 3 stands from extremity elevated EOB (almost completely in standing), however pt unable to lift buttocks from bed despite max A, reporting her "left leg has no strength" - pt very emotional and fearful that she's "going backwards" - provided emotional support and encouragement and reminded pt of her progress thus far. Switched out RW for [employer] and placed [employer] under RLE to improve adherence with RLE TDWB  precautions. Pt stood from (less) elevated EOB with Harmon Pier walker and mod A x 5 reps - cues for anterior weight shifting, upright posture,  and upward gaze (+2 stabilizing Airex and on standby). Pt then reported soling brief - transferred sit<>supine with max A for BLE management. Pt rolled L/R with CGA and use of bedrail and removed dirty brief, performed peri-care dependently, and donned clean brief with max A. Inverted bed to Trendelenburg positioned and pt scooted to HOB pulling on headboard with supervision. Concluded session with pt semi-reclined in bed, needs within reach, and bed alarm on.   Therapy Documentation Precautions:  Precautions Precautions: Fall, Anterior Hip, Other (comment) Precaution Comments: HD access port RUQ, L LE anterior hip precautions, R LE rehab protocol in pt's paper chart Required Braces or Orthoses: Knee Immobilizer - Right Knee Immobilizer - Right: On at all times, Other (comment) Restrictions Weight Bearing Restrictions: Yes RLE Weight Bearing: Touchdown weight bearing LLE Weight Bearing: Weight bearing as tolerated Other Position/Activity Restrictions: L LE anterior hip precautions - no hip extension, hip external rotation, or abduction  Therapy/Group: Individual Therapy Alfonse Alpers PT, DPT  10/24/2021, 7:16 AM

## 2021-10-24 NOTE — Progress Notes (Signed)
PROGRESS NOTE   Subjective/Complaints: She complains of left heel swelling and pain- thinks it is due to walking barefoot on our floors- has asked her husband to bring in tennis shoes this weekend and would like to see if this helps    ROS: No CP, SOB, N/V +pain in bilateral lower extremities, post-op pain in right lower extremity improved, constipation resolved, +bowel incontinence- continues, +left heel pain  Objective:   No results found. No results for input(s): WBC, HGB, HCT, PLT in the last 72 hours.    Recent Labs    10/22/21 1318 10/24/21 0553  NA 136  --   K 4.3  --   CL 96*  --   CO2 24  --   GLUCOSE 221*  --   BUN 58*  --   CREATININE 6.92* 6.10*  CALCIUM 9.2  --       Intake/Output Summary (Last 24 hours) at 10/24/2021 1327 Last data filed at 10/24/2021 0819 Gross per 24 hour  Intake 597 ml  Output --  Net 597 ml         Physical Exam: Vital Signs Blood pressure (!) 143/68, pulse 89, temperature 98.1 F (36.7 C), resp. rate 18, height 5\' 7"  (1.702 m), weight 76.1 kg, SpO2 98 %. Gen: no distress, normal appearing, BMI 26.35 HEENT: oral mucosa pink and moist, NCAT Cardio: Reg rate Chest: normal effort, normal rate of breathing Abd: soft, non-distended Ext: no edema Psych: pleasant, normal affect Musculoskeletal:        General: Swelling and tenderness present.     Cervical back: Normal range of motion and neck supple.     Right lower leg: Edema present.     Left lower leg: Edema present.     Comments: Left hip tender to palpation and PROM with associated swelling. Left knee with mild effusion, patella laxity. Right hip tender with leg raise  Active range of motion by patient with good improvements. Right knee in immobilizer.  Right knee tender and swollen, ace bandage in place Able to get out of bed today Skin:    Comments: Incision site dressing clean dry and intact.  R IJ TDC with  bandage Neurological/MSK    Comments: Patient is alert.  No acute distress.  Oriented x3 and follows commands. Alert and oriented x 3. Normal insight and awareness. Intact Memory. Normal language and speech. Cranial nerve exam unremarkable. UE motor 5/5. LE motor limited proximally by pain. ADF/PF 4-5/5. No sensory findings.   Can propel her wheelchair through hallways Left heel tender to palpation Psychiatric:     Comments: Pleasant, slightly anxious  GU: stool incontinence in diaper   Assessment/Plan: 1. Functional deficits which require 3+ hours per day of interdisciplinary therapy in a comprehensive inpatient rehab setting. Physiatrist is providing close team supervision and 24 hour management of active medical problems listed below. Physiatrist and rehab team continue to assess barriers to discharge/monitor patient progress toward functional and medical goals  Care Tool:  Bathing    Body parts bathed by patient: Face   Body parts bathed by helper: Left lower leg Body parts n/a: Right lower leg   Bathing assist Assist Level: Independent  with assistive device     Upper Body Dressing/Undressing Upper body dressing   What is the patient wearing?: Dress    Upper body assist Assist Level: Set up assist    Lower Body Dressing/Undressing Lower body dressing      What is the patient wearing?: Pants     Lower body assist Assist for lower body dressing: Minimal Assistance - Patient > 75%     Toileting Toileting Toileting Activity did not occur (Clothing management and hygiene only): N/A (no void or bm)  Toileting assist Assist for toileting: Maximal Assistance - Patient 25 - 49%     Transfers Chair/bed transfer  Transfers assist     Chair/bed transfer assist level: Contact Guard/Touching assist Chair/bed transfer assistive device: Sliding board   Locomotion Ambulation   Ambulation assist   Ambulation activity did not occur: Safety/medical concerns           Walk 10 feet activity   Assist  Walk 10 feet activity did not occur: Safety/medical concerns        Walk 50 feet activity   Assist Walk 50 feet with 2 turns activity did not occur: Safety/medical concerns         Walk 150 feet activity   Assist Walk 150 feet activity did not occur: Safety/medical concerns         Walk 10 feet on uneven surface  activity   Assist Walk 10 feet on uneven surfaces activity did not occur: Safety/medical concerns         Wheelchair     Assist Is the patient using a wheelchair?: Yes Type of Wheelchair: Manual    Wheelchair assist level: Supervision/Verbal cueing, Set up assist Max wheelchair distance: 17ft    Wheelchair 50 feet with 2 turns activity    Assist        Assist Level: Supervision/Verbal cueing   Wheelchair 150 feet activity     Assist      Assist Level: Supervision/Verbal cueing   Blood pressure (!) 143/68, pulse 89, temperature 98.1 F (36.7 C), resp. rate 18, height 5\' 7"  (1.702 m), weight 76.1 kg, SpO2 98 %.    Medical Problem List and Plan: 1. Functional deficits secondary to nondisplaced fracture right inferior pubic ramus as well as a displaced foreshortened fracture through the left femoral neck/intertrochanteric area.  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   -Weightbearing as tolerated with anterior total hip precautions.                                  -Conservative care right inferior pubic ramus fracture, weightbearing as tolerated             -patient may  shower             -ELOS/Goals: 3-4 weeks total, supervision to min assist goals  Continue CIR  Will write note for her husband with current d/c date and stating that this could be subject to change.  2.  Impaired mobility -DVT/anticoagulation:  Mechanical: Antiembolism stockings, thigh (TED hose) Bilateral lower extremities. Vascular ultrasound reviewed and negative for clot.  -should be able to begin  lovenox today as permacath placed            -antiplatelet therapy: N/A 3. Femur fracture pain: conitnue Flector patch,Robaxin 750 mg 4 times daily, hydrocodone and oxycodone as needed             -pt having  significant spasms in left thigh.             -add kpad to help with spasms  -schedule tylenol 1000mg  TID 4. Anxiety: Instructed on deep breathing techniques. Provide emotional support.  Melatonin as needed             -antipsychotic agents: N/A 5. Neuropsych: This patient is capable of making decisions on her own behalf. 6. Skin/Wound Care: Routine skin checks 7. Fluids/Electrolytes/Nutrition: Routine in and outs with follow-up chemistries 8.  End-stage renal disease/hemodialysis.  Permacath exchanged 10/03/2021.    -  Follow-up hemodialysis per Dr. [name] -HD later in day to allow participation in therapies during the day 9.  Acute on chronic anemia.  Has had transfusions already x 2. Hgb has stabilized in 7 range. Continue iron supplement.              -epo per nephrology 10.  Diabetes mellitus with peripheral neuropathy. Placed order that she may use her Dexcom 6. Hemoglobin A1c 6.2.  SSI as prior to admission. Provide dietary education.  CBG (last 3)  Recent Labs    10/23/21 1643 10/23/21 2058 10/24/21 0606  GLUCAP 145* 200* 174*  Some lability will monitor.Increase Semglee to 13U  11.  Hypertension. Conitnue cozaar Vitals:   10/24/21 1312 10/24/21 1316  BP: (!) 142/64 (!) 143/68  Pulse: 89 89  Resp: 18 18  Temp: 98.1 F (36.7 C)   SpO2:    12.  Hyperparathyroidism of renal origin.  Plan outpatient parathyroidectomy per Dr.Lateef 13.  Overweight.  BMI 29.11-->26.35  Dietary follow-up. Provided list of foods that can help with weight loss. Discussed benefits of intermittent fasting.  14. Cushingoid?--outpt work up 6. Constipation: resolved. Decrease colace to daily prn. D/c senna. Discussed benefits of high fiber foods. Continue bowel regimen. D/c miralax.  16. Right  quadriceps tear: surgery complete with good healing on XR, minimal postop pain, discussed importance of participating in 3 hours therapy daily as much as she can tolerate and she is very motivated and excited for rehab, remove dressing today 17. Gas: continue simethicone prn 18. Ileus: resolved, regular diet resumed -tolerating.  19. Abd spasms: resolved 20. Black tarry stools: discussed heme occult negative last night. Hgb reviewed and stable.  21. Vitamin D deficiency: continue ergocalciferol 50,000U once per week for 7 weeks. Discussed that this dose is higher than a daily supplement.  22. Left heel pain: discussed possible plantar fasciitis. Asked OT to give her exercises/medicine ball to roll foot on.    LOS: 7 days A FACE TO FACE EVALUATION WAS PERFORMED  Martha Clan P Windy Dudek 10/24/2021, 1:27 PM

## 2021-10-24 NOTE — Progress Notes (Addendum)
Occupational Therapy Session Note  Patient Details  Name: [name] MRN: [id number] Date of Birth: [birth year]  Today's Date: 10/24/2021 OT Individual Time: 1004-1057 OT Individual Time Calculation (min): 53 min    Short Term Goals: Week 1:  OT Short Term Goal 1 (Week 1): Pt will complete LB dressing with Mod A using AE as needed OT Short Term Goal 2 (Week 1): Pt will complete BSC transfer with 1 assist using LRAD to promote OOB toileting OT Short Term Goal 3 (Week 1): Pt will complete 1/3 components of toileting with supervision  Skilled Therapeutic Interventions/Progress Updates:  Skilled OT intervention completed with focus on [name] transfers, toileting. Pt received upright in bed, agreeable to session. Pt completed bed mobility with supervision to EOB, then slideboard transfer to padded tub bench for purpose of toilet transfer, with pt requiring min A +2 (2nd person for safety), then mod A +2 back to EOB. Pt required total A for board placement as well as sheet for ease of sliding on board due to pt's refusal of putting shorts on prior to transfer. Education provided to pt on purpose of practicing [name] transfer and trying to progress to completing toileting on commode vs constantly depending on total A toileting care at bed level. MD in room for rounds.  During transfer back to bed, pt reported she felt a BM coming on, with noticeable BM on floor from "gas" and pt's brief not covering both buttocks. While EOB, pt able to complete several lateral leans with use of bed rails with min A for > L when lifting R braced leg, and total A of posterior pericare and Max A donning of brief. Pt required rest breaks throughout toileting tasks due to pain and fatigue. Pt requesting to get back into bed, with mod A needed for lifting BLEs to swivel pt into bed, and then pt assisting with scooting towards [name] with bed in trendelenburg position and pt using BUEs to assist with mod A boosting. Pt left upright in  bed, with bed alarm on, in the lowest position and all needs in reach at end of session.   Therapy Documentation Precautions:  Precautions Precautions: Fall, Anterior Hip, Other (comment) Precaution Comments: HD access port RUQ, L LE anterior hip precautions, R LE rehab protocol in pt's paper chart Required Braces or Orthoses: Knee Immobilizer - Right Knee Immobilizer - Right: On at all times, Other (comment) Restrictions Weight Bearing Restrictions: Yes RLE Weight Bearing: Touchdown weight bearing LLE Weight Bearing: Weight bearing as tolerated Other Position/Activity Restrictions: L LE anterior hip precautions - no hip extension, hip external rotation, or abduction  Pain: Unrated pain in L foot, L hip and stomach during transfers, repositioned pt for comfort, MD in room and aware.   Therapy/Group: Individual Therapy  [name] 10/24/2021, 7:34 AM

## 2021-10-25 DIAGNOSIS — N186 End stage renal disease: Secondary | ICD-10-CM

## 2021-10-25 DIAGNOSIS — D649 Anemia, unspecified: Secondary | ICD-10-CM

## 2021-10-25 DIAGNOSIS — E1142 Type 2 diabetes mellitus with diabetic polyneuropathy: Secondary | ICD-10-CM

## 2021-10-25 DIAGNOSIS — I10 Essential (primary) hypertension: Secondary | ICD-10-CM

## 2021-10-25 DIAGNOSIS — Z992 Dependence on renal dialysis: Secondary | ICD-10-CM

## 2021-10-25 LAB — GLUCOSE, CAPILLARY
Glucose-Capillary: 181 mg/dL — ABNORMAL HIGH (ref 70–99)
Glucose-Capillary: 195 mg/dL — ABNORMAL HIGH (ref 70–99)
Glucose-Capillary: 150 mg/dL — ABNORMAL HIGH (ref 70–99)

## 2021-10-25 NOTE — Progress Notes (Signed)
PROGRESS NOTE   Subjective/Complaints: Patient seen sitting up in bed this morning.  She states she slept well overnight.  She denies complaints.  She has questions regarding her scheduled for today.  She was seen by nephrology yesterday, notes reviewed-Aricept dose increased.  ROS: Denies CP, SOB, N/V/D  Objective:   No results found. Recent Labs    10/24/21 1326  WBC 6.4  HGB 8.7*  HCT 27.5*  PLT 235      Recent Labs    10/22/21 1318 10/24/21 0553 10/24/21 1326  NA 136  --  135  K 4.3  --  4.5  CL 96*  --  96*  CO2 24  --  22  GLUCOSE 221*  --  232*  BUN 58*  --  52*  CREATININE 6.92* 6.10* 6.40*  CALCIUM 9.2  --  9.2       Intake/Output Summary (Last 24 hours) at 10/25/2021 1212 Last data filed at 10/24/2021 1900 Gross per 24 hour  Intake 210 ml  Output 1100 ml  Net -890 ml          Physical Exam: Vital Signs Blood pressure (!) 152/73, pulse 89, temperature 98.6 F (37 C), temperature source Oral, resp. rate 18, height 5\' 7"  (1.702 m), weight 75 kg, SpO2 100 %. Gen: no distress, normal appearing, BMI 26.35 HEENT: oral mucosa pink and moist, NCAT Cardio: Reg rate Chest: normal effort, normal rate of breathing Abd: soft, non-distended Ext: no edema Psych: pleasant, normal affect Musculoskeletal:        General: Swelling and tenderness present.     Cervical back: Normal range of motion and neck supple.     Right lower leg: Edema present.     Left lower leg: Edema present.     Comments: Left hip tender to palpation and PROM with associated swelling. Left knee with mild effusion, patella laxity. Right hip tender with leg raise  Right knee tender and swollen, ace bandage in place Skin:    Comments: Incision site dressing clean dry and intact.  R IJ TDC with bandage Neuro: Alert Motor: Bilateral upper extremities: 5/5 proximal distal Bilateral lower extremities:  Right lower extremity:  Limited by bracing, hip flexion?  2/5   Assessment/Plan: 1. Functional deficits which require 3+ hours per day of interdisciplinary therapy in a comprehensive inpatient rehab setting. Physiatrist is providing close team supervision and 24 hour management of active medical problems listed below. Physiatrist and rehab team continue to assess barriers to discharge/monitor patient progress toward functional and medical goals  Care Tool:  Bathing    Body parts bathed by patient: Face   Body parts bathed by helper: Left lower leg Body parts n/a: Right lower leg   Bathing assist Assist Level: Independent with assistive device     Upper Body Dressing/Undressing Upper body dressing   What is the patient wearing?: Dress    Upper body assist Assist Level: Set up assist    Lower Body Dressing/Undressing Lower body dressing      What is the patient wearing?: Pants     Lower body assist Assist for lower body dressing: Minimal Assistance - Patient > 75%  Toileting Toileting Toileting Activity did not occur Landscape architect and hygiene only): N/A (no void or bm)  Toileting assist Assist for toileting: Maximal Assistance - Patient 25 - 49%     Transfers Chair/bed transfer  Transfers assist     Chair/bed transfer assist level: Contact Guard/Touching assist Chair/bed transfer assistive device: Sliding board   Locomotion Ambulation   Ambulation assist   Ambulation activity did not occur: Safety/medical concerns          Walk 10 feet activity   Assist  Walk 10 feet activity did not occur: Safety/medical concerns        Walk 50 feet activity   Assist Walk 50 feet with 2 turns activity did not occur: Safety/medical concerns         Walk 150 feet activity   Assist Walk 150 feet activity did not occur: Safety/medical concerns         Walk 10 feet on uneven surface  activity   Assist Walk 10 feet on uneven surfaces activity did not occur:  Safety/medical concerns         Wheelchair     Assist Is the patient using a wheelchair?: Yes Type of Wheelchair: Manual    Wheelchair assist level: Supervision/Verbal cueing, Set up assist Max wheelchair distance: 161ft    Wheelchair 50 feet with 2 turns activity    Assist        Assist Level: Supervision/Verbal cueing   Wheelchair 150 feet activity     Assist      Assist Level: Supervision/Verbal cueing   Blood pressure (!) 152/73, pulse 89, temperature 98.6 F (37 C), temperature source Oral, resp. rate 18, height 5\' 7"  (1.702 m), weight 75 kg, SpO2 100 %.    Medical Problem List and Plan: 1. Functional deficits secondary to nondisplaced fracture right inferior pubic ramus as well as a displaced foreshortened fracture through the left femoral neck/intertrochanteric area.  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   -Weightbearing as tolerated with anterior total hip precautions.                                  -Conservative care right inferior pubic ramus fracture, weightbearing as tolerated             -patient may  shower             -ELOS/Goals: 3-4 weeks total, supervision to min assist goals  Continue CIR 2.  Impaired mobility -DVT/anticoagulation:  Mechanical: Antiembolism stockings, thigh (TED hose) Bilateral lower extremities. Vascular ultrasound reviewed and negative for clot.  -should be able to begin lovenox today as permacath placed            -antiplatelet therapy: N/A 3. Femur fracture pain: conitnue Flector patch, Robaxin 750 mg 4 times daily, hydrocodone and oxycodone as needed             -pt having significant spasms in left thigh.             -add kpad to help with spasms  -schedule tylenol 1000mg  TID  Appears to be controlled on 2/25 4. Anxiety: Instructed on deep breathing techniques. Provide emotional support.  Melatonin as needed             -antipsychotic agents: N/A 5. Neuropsych: This patient is capable of making  decisions on her own behalf. 6. Skin/Wound Care: Routine skin checks 7. Fluids/Electrolytes/Nutrition: Routine in and outs with follow-up  chemistries 8.  ESRD on HD.  Permacath exchanged 10/03/2021.    -  Follow-up hemodialysis per Dr. [name] -HD later in day to allow participation in therapies during the day Appreciate nephro recs 9.  Acute on chronic anemia.  Has had transfusions already x 2. Hgb has stabilized in 7 range. Continue iron supplement.              -epo per nephrology  Hemoglobin 8.7 on 2/24, appreciate nephro recs 10.  Diabetes mellitus with peripheral neuropathy. Placed order that she may use her Dexcom 6. Hemoglobin A1c 6.2.  SSI as prior to admission. Provide dietary education.  CBG (last 3)  Recent Labs    10/24/21 2049 10/25/21 0550 10/25/21 1128  GLUCAP 187* 181* 195*   Some lability will monitor.Increase Semglee to 13U  11.  Hypertension. Conitnue Cozaar Vitals:   10/24/21 1945 10/25/21 0424  BP: (!) 139/94 (!) 152/73  Pulse: 95 89  Resp: 18 18  Temp: 98.9 F (37.2 C) 98.6 F (37 C)  SpO2: 99% 100%   Mildly elevated, avoid overtreatment due to dialysis 12.  Hyperparathyroidism of renal origin.  Plan outpatient parathyroidectomy per Dr.Lateef 13.  Overweight.  BMI 29.11-->26.35  Dietary follow-up. Provided list of foods that can help with weight loss. Discussed benefits of intermittent fasting.  14. Cushingoid?--outpt work up 27. Constipation: resolved. Decrease colace to daily prn. D/c senna. Discussed benefits of high fiber foods. Continue bowel regimen. D/c miralax.  16. Right quadriceps tear: surgery complete with good healing on XR, minimal postop pain, discussed importance of participating in 3 hours therapy daily as much as she can tolerate and she is very motivated and excited for rehab, remove dressing today 17. Gas: continue simethicone prn 18. Ileus: resolved, regular diet resumed -tolerating.  19. Abd spasms: resolved 20. Black tarry stools:  discussed heme occult negative last night. Hgb reviewed and stable.  21. Vitamin D deficiency: continue ergocalciferol 50,000U once per week for 7 weeks. Discussed that this dose is higher than a daily supplement.  22. Left heel pain: discussed possible plantar fasciitis. Asked OT to give her exercises/medicine ball to roll foot on.    LOS: 8 days A FACE TO FACE EVALUATION WAS PERFORMED  Sadao Weyer Lorie Phenix 10/25/2021, 12:12 PM

## 2021-10-25 NOTE — Progress Notes (Signed)
[facility] Progress Note   Subjective:   Patient seen and examined at bedside.  Reports multiple BM yesterday and overnight.  States "I have never pooped so much in my life."  Denies CP, SOB, abdominal pain and n/v/d.  No BM so far this AM.    Objective Vitals:   10/24/21 1635 10/24/21 1810 10/24/21 1945 10/25/21 0424  BP: (!) 140/56 (!) 151/71 (!) 139/94 (!) 152/73  Pulse: 83 87 95 89  Resp:  16 18 18   Temp:  98.7 F (37.1 C) 98.9 F (37.2 C) 98.6 F (37 C)  TempSrc:  Oral Oral Oral  SpO2:  100% 99% 100%  Weight: 75 kg     Height:       Physical Exam General:well appearing female in NAD Heart:RRR, no mrg Lungs:CTAB, nml WOB on RA Abdomen:soft, NTND Extremities:no LE edema, R leg in brace Dialysis Access: [facility]   Filed Weights   10/24/21 0611 10/24/21 1312 10/24/21 1635  Weight: 79.4 kg 76.1 kg 75 kg    Intake/Output Summary (Last 24 hours) at 10/25/2021 1057 Last data filed at 10/24/2021 1900 Gross per 24 hour  Intake 210 ml  Output 1100 ml  Net -890 ml    Additional Objective Labs: Basic Metabolic Panel: Recent Labs  Lab 10/20/21 1455 10/22/21 1318 10/24/21 0553 10/24/21 1326  NA 134* 136  --  135  K 4.8 4.3  --  4.5  CL 95* 96*  --  96*  CO2 23 24  --  22  GLUCOSE 245* 221*  --  232*  BUN 68* 58*  --  52*  CREATININE 7.97* 6.92* 6.10* 6.40*  CALCIUM 9.0 9.2  --  9.2  PHOS 4.9* 5.0*  --  5.1*   Liver Function Tests: Recent Labs  Lab 10/20/21 1455 10/22/21 1318 10/24/21 1326  ALBUMIN 3.1* 3.1* 3.1*   CBC: Recent Labs  Lab 10/20/21 1456 10/24/21 1326  WBC 8.3 6.4  HGB 8.6* 8.7*  HCT 27.2* 27.5*  MCV 100.4* 100.4*  PLT 248 235   CBG: Recent Labs  Lab 10/23/21 2058 10/24/21 0606 10/24/21 1718 10/24/21 2049 10/25/21 0550  GLUCAP 200* 174* 175* 187* 181*   Medications:   acetaminophen  650 mg Oral TID   Chlorhexidine Gluconate Cloth  6 each Topical Q0600   darbepoetin (ARANESP) injection - DIALYSIS  60 mcg  Intravenous Q Fri-HD   enoxaparin (LOVENOX) injection  30 mg Subcutaneous Q24H   insulin aspart  0-9 Units Subcutaneous TID WC   insulin glargine-yfgn  13 Units Subcutaneous Daily   losartan  50 mg Oral Daily   Vitamin D (Ergocalciferol)  50,000 Units Oral Q7 days    Dialysis Orders: MWF [facility] [address] [phone])  3h 73min  81kg   Hep 3000 then 800u/hr  [facility] TDC    - Hep B SAg neg 2/8, covid neg 1/29   Assessment/Plan: Debility - rehab per CIR RLE tendon rupture - s/p surgery 2/16 L hip fracture/ R pelvic ramus fx - sp L hemiarthroplasty on 09/29/21 at [facility], then sent here for CIR ESRD - on HD MWF. next HD 10/27/21 BP/ volume - BP elevated prior to getting meds this AM. Getting cozaar only. Under dry wt, lower edw at dc DM2 - per pmd Anemia ckd - Hgb trending down, last 8.7. S/p 1 unit prbc on 2/3. transfusion. 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  Continue binders.  9.  Nutrition - renal diet  Jen Mow, PA-C [facility] 10/25/2021,10:57 AM  LOS: 8 days

## 2021-10-25 NOTE — Progress Notes (Signed)
Occupational Therapy Session Note  Patient Details  Name: [name] MRN: [id number] Date of Birth: Apr 16, 1966  Today's Date: 10/26/2021 OT Individual Time: 4730-8569 OT Individual Time Calculation (min): 64 min   Short Term Goals: Week 1:  OT Short Term Goal 1 (Week 1): Pt will complete LB dressing with Mod A using AE as needed OT Short Term Goal 1 - Progress (Week 1): Met OT Short Term Goal 2 (Week 1): Pt will complete BSC transfer with 1 assist using LRAD to promote OOB toileting OT Short Term Goal 2 - Progress (Week 1): Not met OT Short Term Goal 3 (Week 1): Pt will complete 1/3 components of toileting with supervision OT Short Term Goal 3 - Progress (Week 1): Met  Skilled Therapeutic Interventions/Progress Updates:    Pt greeted in bed, spouse Aaron Edelman present. They both had several questions pertaining to d/c plans and d/c date. Both would greatly like an extension in pts LOS if possible. Pt wishing to be able to stand before leaving CIR if possible. We discussed toileting plans at home, pt still has incontinence but would like to work on improving this and wants a BSC for home. OT discussed BSC options that the hospital would provide and also ones that she would need to pay out of pocket for but may be more comfortable and suited to her personal needs. Discussed at length importance of completing seated level exercises in her bed to build strength/endurance needed in order to complete functional sit<stands. Discussed modified push ups, using theraband tied to bed, and also her gait belt independently for increasing core, UB, and Lt LE strength respectively to meet stated goal. Reviewed techniques, pt very mindful of her precautions. Pt reporting that our conversation was very helpful and made her feel better about understanding OT role in her goal achievement and d/c plans. Relayed her present concerns to interprofessional team via virtual chat. Pt remained in bed at close of session, all  needs within reach.   Therapy Documentation Precautions:  Precautions Precautions: Fall, Anterior Hip, Other (comment) Precaution Comments: HD access port RUQ, L LE anterior hip precautions, R LE rehab protocol in pt's paper chart Required Braces or Orthoses: Knee Immobilizer - Right Knee Immobilizer - Right: On at all times, Other (comment) Restrictions Weight Bearing Restrictions: Yes RLE Weight Bearing: Touchdown weight bearing LLE Weight Bearing: Weight bearing as tolerated Other Position/Activity Restrictions: L LE anterior hip precautions - no hip extension, hip external rotation, or abduction Pain:   ADL: ADL Grooming: Setup Where Assessed-Grooming: Edge of bed Upper Body Bathing: Setup Where Assessed-Upper Body Bathing: Edge of bed Lower Body Bathing: Minimal assistance Where Assessed-Lower Body Bathing: Edge of bed Upper Body Dressing: Setup Where Assessed-Upper Body Dressing: Edge of bed Lower Body Dressing: Maximal assistance Where Assessed-Lower Body Dressing: Edge of bed Toileting: Not assessed Toilet Transfer: Not assessed Tub/Shower Transfer: Not assessed   Therapy/Group: Individual Therapy  Fletcher Ostermiller A Leonidus Rowand 10/26/2021, 4:15 PM

## 2021-10-26 LAB — GLUCOSE, CAPILLARY
Glucose-Capillary: 177 mg/dL — ABNORMAL HIGH (ref 70–99)
Glucose-Capillary: 166 mg/dL — ABNORMAL HIGH (ref 70–99)
Glucose-Capillary: 135 mg/dL — ABNORMAL HIGH (ref 70–99)
Glucose-Capillary: 246 mg/dL — ABNORMAL HIGH (ref 70–99)

## 2021-10-26 MED ORDER — LOSARTAN POTASSIUM 50 MG PO TABS
75.0000 mg | ORAL_TABLET | Freq: Every day | ORAL | Status: DC
  Administered 2021-10-27 – 2021-10-29 (×3): 75 mg via ORAL
  Filled 2021-10-26 (×3): qty 2

## 2021-10-26 MED ORDER — CHLORHEXIDINE GLUCONATE CLOTH 2 % EX PADS
6.0000 | MEDICATED_PAD | Freq: Every day | CUTANEOUS | Status: DC
  Administered 2021-10-26 – 2021-11-08 (×14): 6 via TOPICAL

## 2021-10-26 NOTE — Progress Notes (Signed)
°  [facility] Progress Note   Subjective:   Patient seen and examined at bedside.  Reports ongoing BM yesterday but has improved this AM.  Denies abdominal pain, n/v, CP, SOB and fatigue.    Objective Vitals:   10/25/21 0424 10/25/21 1325 10/25/21 2008 10/26/21 0408  BP: (!) 152/73 (!) 126/53 (!) 165/63 (!) 154/68  Pulse: 89 94 86 96  Resp: 18 18 16 16   Temp: 98.6 F (37 C) 98.5 F (36.9 C) 97.6 F (36.4 C) 98 F (36.7 C)  TempSrc: Oral  Oral Oral  SpO2: 100% 97% 100% 100%  Weight:      Height:       Physical Exam General:chronically ill appearing female in NAD Heart:RRR, no mrg Lungs:CTAB, nml WOB Abdomen:soft, NTND Extremities:no LE edema, R leg in brace Dialysis Access: [facility]   Filed Weights   10/24/21 0611 10/24/21 1312 10/24/21 1635  Weight: 79.4 kg 76.1 kg 75 kg    Intake/Output Summary (Last 24 hours) at 10/26/2021 1028 Last data filed at 10/25/2021 1457 Gross per 24 hour  Intake 220 ml  Output --  Net 220 ml    Additional Objective Labs: Basic Metabolic Panel: Recent Labs  Lab 10/20/21 1455 10/22/21 1318 10/24/21 0553 10/24/21 1326  NA 134* 136  --  135  K 4.8 4.3  --  4.5  CL 95* 96*  --  96*  CO2 23 24  --  22  GLUCOSE 245* 221*  --  232*  BUN 68* 58*  --  52*  CREATININE 7.97* 6.92* 6.10* 6.40*  CALCIUM 9.0 9.2  --  9.2  PHOS 4.9* 5.0*  --  5.1*   Liver Function Tests: Recent Labs  Lab 10/20/21 1455 10/22/21 1318 10/24/21 1326  ALBUMIN 3.1* 3.1* 3.1*   CBC: Recent Labs  Lab 10/20/21 1456 10/24/21 1326  WBC 8.3 6.4  HGB 8.6* 8.7*  HCT 27.2* 27.5*  MCV 100.4* 100.4*  PLT 248 235   Medications:   acetaminophen  650 mg Oral TID   Chlorhexidine Gluconate Cloth  6 each Topical Q0600   darbepoetin (ARANESP) injection - DIALYSIS  60 mcg Intravenous Q Fri-HD   enoxaparin (LOVENOX) injection  30 mg Subcutaneous Q24H   insulin aspart  0-9 Units Subcutaneous TID WC   insulin glargine-yfgn  13 Units Subcutaneous Daily    losartan  50 mg Oral Daily   Vitamin D (Ergocalciferol)  50,000 Units Oral Q7 days    Dialysis Orders: MWF [facility] [address] [phone])  3h 8min  81kg   Hep 3000 then 800u/hr  [facility] TDC    - Hep B SAg neg 2/8, covid neg 1/29   Assessment/Plan: Debility - rehab per CIR RLE tendon rupture - s/p surgery 2/16 L hip fracture/ R pelvic ramus fx - sp L hemiarthroplasty on 09/29/21 at [facility], then sent here for CIR ESRD - on HD MWF. next HD 10/27/21 BP/ volume - BP remains elevated.  Increase cozaar 75mg  qd. Under dry wt, lower edw at dc DM2 - per pmd Anemia ckd - Hgb trending down, last 8.7. S/p 1 unit prbc on 2/3.  Aranesp started 40 weekly on Fridays, increase dose 69mcg on 2/24 MBD ckd - Calcium and phos in goal.  Continue binders.  9. Nutrition - renal diet  Jen Mow, PA-C [facility] 10/26/2021,10:28 AM  LOS: 9 days

## 2021-10-27 LAB — RENAL FUNCTION PANEL
Albumin: 3.3 g/dL — ABNORMAL LOW (ref 3.5–5.0)
Anion gap: 17 — ABNORMAL HIGH (ref 5–15)
BUN: 55 mg/dL — ABNORMAL HIGH (ref 6–20)
CO2: 23 mmol/L (ref 22–32)
Calcium: 9.1 mg/dL (ref 8.9–10.3)
Chloride: 96 mmol/L — ABNORMAL LOW (ref 98–111)
Creatinine, Ser: 7.31 mg/dL — ABNORMAL HIGH (ref 0.44–1.00)
GFR, Estimated: 6 mL/min — ABNORMAL LOW (ref >60)
Glucose, Bld: 202 mg/dL — ABNORMAL HIGH (ref 70–99)
Phosphorus: 4.9 mg/dL — ABNORMAL HIGH (ref 2.5–4.6)
Potassium: 5 mmol/L (ref 3.5–5.1)
Sodium: 136 mmol/L (ref 135–145)

## 2021-10-27 LAB — GLUCOSE, CAPILLARY
Glucose-Capillary: 156 mg/dL — ABNORMAL HIGH (ref 70–99)
Glucose-Capillary: 251 mg/dL — ABNORMAL HIGH (ref 70–99)

## 2021-10-27 LAB — CBC
HCT: 27.6 % — ABNORMAL LOW (ref 36.0–46.0)
Hemoglobin: 8.7 g/dL — ABNORMAL LOW (ref 12.0–15.0)
MCH: 32 pg (ref 26.0–34.0)
MCHC: 31.5 g/dL (ref 30.0–36.0)
MCV: 101.5 fL — ABNORMAL HIGH (ref 80.0–100.0)
Platelets: 245 10*3/uL (ref 150–400)
RBC: 2.72 MIL/uL — ABNORMAL LOW (ref 3.87–5.11)
RDW: 15.9 % — ABNORMAL HIGH (ref 11.5–15.5)
WBC: 6.8 10*3/uL (ref 4.0–10.5)
nRBC: 0 % (ref 0.0–0.2)

## 2021-10-27 MED ORDER — INSULIN GLARGINE-YFGN 100 UNIT/ML ~~LOC~~ SOLN
14.0000 [IU] | Freq: Every day | SUBCUTANEOUS | Status: DC
  Administered 2021-10-28: 14 [IU] via SUBCUTANEOUS
  Filled 2021-10-27: qty 0.14

## 2021-10-27 MED ORDER — HEPARIN SODIUM (PORCINE) 1000 UNIT/ML IJ SOLN
INTRAMUSCULAR | Status: AC
  Administered 2021-10-27: 1000 [IU]
  Filled 2021-10-27: qty 4

## 2021-10-27 MED ORDER — HEPARIN SODIUM (PORCINE) 1000 UNIT/ML DIALYSIS: 4000.0000 [IU] | INTRAMUSCULAR | Status: DC | PRN

## 2021-10-27 NOTE — Progress Notes (Signed)
PROGRESS NOTE   Subjective/Complaints: Had a negative experience on Friday in therapy when transitioning to bedside commode- notes stool feel on floor and bed and she was thinking about this experience all weekend  ROS: Denies CP, SOB, +incontinent stool  Objective:   No results found. No results for input(s): WBC, HGB, HCT, PLT in the last 72 hours.     No results for input(s): NA, K, CL, CO2, GLUCOSE, BUN, CREATININE, CALCIUM in the last 72 hours.    Intake/Output Summary (Last 24 hours) at 10/27/2021 1342 Last data filed at 10/27/2021 0900 Gross per 24 hour  Intake 236 ml  Output --  Net 236 ml         Physical Exam: Vital Signs Blood pressure (!) 176/76, pulse 91, temperature 97.9 F (36.6 C), temperature source Oral, resp. rate 20, height 5\' 7"  (1.702 m), weight 78.1 kg, SpO2 100 %. Gen: no distress, normal appearing, BMI 26.35 HEENT: oral mucosa pink and moist, NCAT Cardio: Reg rate Chest: normal effort, normal rate of breathing Abd: soft, non-distended Ext: no edema Psych: pleasant, normal affect, anxiety Musculoskeletal:        General: Swelling and tenderness present.     Cervical back: Normal range of motion and neck supple.     Right lower leg: Edema present.     Left lower leg: Edema present.     Comments: Left hip tender to palpation and PROM with associated swelling. Left knee with mild effusion, patella laxity. Right hip tender with leg raise  Right knee tender and swollen, ace bandage in place Skin:    Comments: Incision site dressing clean dry and intact.  R IJ TDC with bandage Neuro: Alert Motor: Bilateral upper extremities: 5/5 proximal distal Bilateral lower extremities:  Right lower extremity: Limited by bracing, hip flexion?  2/5   Assessment/Plan: 1. Functional deficits which require 3+ hours per day of interdisciplinary therapy in a comprehensive inpatient rehab  setting. Physiatrist is providing close team supervision and 24 hour management of active medical problems listed below. Physiatrist and rehab team continue to assess barriers to discharge/monitor patient progress toward functional and medical goals  Care Tool:  Bathing    Body parts bathed by patient: Face   Body parts bathed by helper: Left lower leg Body parts n/a: Right lower leg   Bathing assist Assist Level: Independent with assistive device     Upper Body Dressing/Undressing Upper body dressing   What is the patient wearing?: Dress    Upper body assist Assist Level: Set up assist    Lower Body Dressing/Undressing Lower body dressing      What is the patient wearing?: Pants     Lower body assist Assist for lower body dressing: Minimal Assistance - Patient > 75%     Toileting Toileting Toileting Activity did not occur (Clothing management and hygiene only): N/A (no void or bm)  Toileting assist Assist for toileting: Maximal Assistance - Patient 25 - 49%     Transfers Chair/bed transfer  Transfers assist     Chair/bed transfer assist level: Contact Guard/Touching assist Chair/bed transfer assistive device: Sliding board   Locomotion Ambulation   Ambulation assist  Ambulation activity did not occur: Safety/medical concerns          Walk 10 feet activity   Assist  Walk 10 feet activity did not occur: Safety/medical concerns        Walk 50 feet activity   Assist Walk 50 feet with 2 turns activity did not occur: Safety/medical concerns         Walk 150 feet activity   Assist Walk 150 feet activity did not occur: Safety/medical concerns         Walk 10 feet on uneven surface  activity   Assist Walk 10 feet on uneven surfaces activity did not occur: Safety/medical concerns         Wheelchair     Assist Is the patient using a wheelchair?: Yes Type of Wheelchair: Manual    Wheelchair assist level: Supervision/Verbal  cueing, Set up assist Max wheelchair distance: 171ft    Wheelchair 50 feet with 2 turns activity    Assist        Assist Level: Supervision/Verbal cueing   Wheelchair 150 feet activity     Assist      Assist Level: Supervision/Verbal cueing   Blood pressure (!) 176/76, pulse 91, temperature 97.9 F (36.6 C), temperature source Oral, resp. rate 20, height 5\' 7"  (1.702 m), weight 78.1 kg, SpO2 100 %.    Medical Problem List and Plan: 1. Functional deficits secondary to nondisplaced fracture right inferior pubic ramus as well as a displaced foreshortened fracture through the left femoral neck/intertrochanteric area.  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   -Weightbearing as tolerated with anterior total hip precautions.                                  -Conservative care right inferior pubic ramus fracture, weightbearing as tolerated             -patient may  shower             -ELOS/Goals: 3-4 weeks total, supervision to min assist goals  Continue CIR  HFU scheduled.  2.  Impaired mobility -DVT/anticoagulation:  Mechanical: Antiembolism stockings, thigh (TED hose) Bilateral lower extremities. Vascular ultrasound reviewed and negative for clot.  -should be able to begin lovenox today as permacath placed            -antiplatelet therapy: N/A 3. Femur fracture pain: conitnue Flector patch, Robaxin 750 mg 4 times daily, hydrocodone and oxycodone as needed             -pt having significant spasms in left thigh.             -add kpad to help with spasms  -schedule tylenol 1000mg  TID  Appears to be controlled on 2/25 4. Anxiety: Instructed on deep breathing techniques. Provide emotional support.  Melatonin as needed             -antipsychotic agents: N/A 5. Neuropsych: This patient is capable of making decisions on her own behalf. 6. Skin/Wound Care: Routine skin checks 7. Fluids/Electrolytes/Nutrition: Routine in and outs with follow-up chemistries 8.  ESRD  on HD.  Permacath exchanged 10/03/2021.    -  Follow-up hemodialysis per Dr. [name] -HD later in day to allow participation in therapies during the day Appreciate nephro recs 9.  Acute on chronic anemia.  Has had transfusions already x 2. Hgb has stabilized in 7 range. Continue iron supplement.              -  epo per nephrology  Hemoglobin 8.7 on 2/24, appreciate nephro recs 10.  Diabetes mellitus with peripheral neuropathy. Placed order that she may use her Dexcom 6. Hemoglobin A1c 6.2.  SSI as prior to admission. Provide dietary education.  CBG (last 3)  Recent Labs    10/26/21 1642 10/26/21 2059 10/27/21 0542  GLUCAP 135* 246* 156*  Some lability will monitor.Increase Semglee to 14U  11.  Hypertension. Continue Cozaar Vitals:   10/26/21 2017 10/27/21 0544  BP: 134/69 (!) 176/76  Pulse: 87 91  Resp: 18 20  Temp: 97.7 F (36.5 C) 97.9 F (36.6 C)  SpO2: 99% 100%   Mildly elevated, avoid overtreatment due to dialysis 12.  Hyperparathyroidism of renal origin.  Plan outpatient parathyroidectomy per Dr.Lateef 13.  Overweight.  BMI 29.11-->26.35  Dietary follow-up. Provided list of foods that can help with weight loss. Discussed benefits of intermittent fasting.  14. Cushingoid?--outpt work up 61. Constipation: resolved. Decrease colace to daily prn. D/c senna. Discussed benefits of high fiber foods. Continue bowel regimen. D/c miralax.  16. Right quadriceps tear: surgery complete with good healing on XR, minimal postop pain, discussed importance of participating in 3 hours therapy daily as much as she can tolerate and she is very motivated and excited for rehab, remove dressing today 17. Gas: continue simethicone prn 18. Ileus: resolved, regular diet resumed -tolerating.  19. Abd spasms: resolved 20. Black tarry stools: discussed heme occult negative last night. Hgb reviewed and stable.  21. Vitamin D deficiency: continue ergocalciferol 50,000U once per week for 7 weeks. Discussed  that this dose is higher than a daily supplement.  22. Left heel pain: discussed possible plantar fasciitis. Asked OT to give her exercises/medicine ball to roll foot on. 23. Stool incontinence: no signs of cauda equina    LOS: 10 days A FACE TO FACE EVALUATION WAS PERFORMED  [name] 10/27/2021, 1:42 PM

## 2021-10-27 NOTE — Progress Notes (Signed)
°  [facility] Progress Note   Subjective:   Seen in room.  Doing well- worked with therapies this week.  No f/c, n/v, SOB, CP, LE edema.  Appetite and energy are OK.    Objective Vitals:   10/26/21 0408 10/26/21 1340 10/26/21 2017 10/27/21 0544  BP: (!) 154/68 (!) 143/67 134/69 (!) 176/76  Pulse: 96 92 87 91  Resp: 16 15 18 20   Temp: 98 F (36.7 C) 98.7 F (37.1 C) 97.7 F (36.5 C) 97.9 F (36.6 C)  TempSrc: Oral Oral Oral Oral  SpO2: 100% 97% 99% 100%  Weight:    78.1 kg  Height:       Physical Exam General:chronically ill appearing female in NAD Heart:RRR, no mrg Lungs:CTAB, nml WOB Abdomen:soft, NTND Extremities:no LE edema, R leg in brace Dialysis Access: [facility]   Filed Weights   10/24/21 1312 10/24/21 1635 10/27/21 0544  Weight: 76.1 kg 75 kg 78.1 kg    Intake/Output Summary (Last 24 hours) at 10/27/2021 1342 Last data filed at 10/27/2021 0900 Gross per 24 hour  Intake 236 ml  Output --  Net 236 ml    Additional Objective Labs: Basic Metabolic Panel: Recent Labs  Lab 10/20/21 1455 10/22/21 1318 10/24/21 0553 10/24/21 1326  NA 134* 136  --  135  K 4.8 4.3  --  4.5  CL 95* 96*  --  96*  CO2 23 24  --  22  GLUCOSE 245* 221*  --  232*  BUN 68* 58*  --  52*  CREATININE 7.97* 6.92* 6.10* 6.40*  CALCIUM 9.0 9.2  --  9.2  PHOS 4.9* 5.0*  --  5.1*   Liver Function Tests: Recent Labs  Lab 10/20/21 1455 10/22/21 1318 10/24/21 1326  ALBUMIN 3.1* 3.1* 3.1*   CBC: Recent Labs  Lab 10/20/21 1456 10/24/21 1326  WBC 8.3 6.4  HGB 8.6* 8.7*  HCT 27.2* 27.5*  MCV 100.4* 100.4*  PLT 248 235   Medications:   acetaminophen  650 mg Oral TID   Chlorhexidine Gluconate Cloth  6 each Topical Q0600   Chlorhexidine Gluconate Cloth  6 each Topical Q0600   darbepoetin (ARANESP) injection - DIALYSIS  60 mcg Intravenous Q Fri-HD   enoxaparin (LOVENOX) injection  30 mg Subcutaneous Q24H   insulin aspart  0-9 Units Subcutaneous TID WC   [START ON  10/28/2021] insulin glargine-yfgn  14 Units Subcutaneous Daily   losartan  75 mg Oral Daily   Vitamin D (Ergocalciferol)  50,000 Units Oral Q7 days    Dialysis Orders: MWF [facility] [address] [id number])  3h 12min  81kg   Hep 3000 then 800u/hr  RIJ TDC    - Hep B SAg neg 2/8, covid neg 1/29   Assessment/Plan: Debility - rehab per CIR RLE tendon rupture - s/p surgery 2/16 L hip fracture/ R pelvic ramus fx - sp L hemiarthroplasty on 09/29/21 at [facility], then sent here for CIR ESRD - on HD MWF. next HD 10/27/21 BP/ volume - cozaar just increased, lower EDW as tolerated DM2 - per pmd Anemia ckd - Hgb trending down, last 8.7. S/p 1 unit prbc on 2/3.  Aranesp started 40 weekly on Fridays, increase dose 81mcg on 2/24 MBD ckd - Calcium and phos in goal.  Continue binders.  9. Nutrition - renal diet  [facility] 10/27/2021,1:42 PM  LOS: 10 days

## 2021-10-27 NOTE — Progress Notes (Signed)
Occupational Therapy Session Note  Patient Details  Name: [name] MRN: [id number] Date of Birth: [birth year]  Today's Date: 10/27/2021 OT Individual Time: 9485-4627 OT Individual Time Calculation (min): 45 min    Short Term Goals: Week 1:  OT Short Term Goal 1 (Week 1): Pt will complete LB dressing with Mod A using AE as needed OT Short Term Goal 1 - Progress (Week 1): Met OT Short Term Goal 2 (Week 1): Pt will complete BSC transfer with 1 assist using LRAD to promote OOB toileting OT Short Term Goal 2 - Progress (Week 1): Not met OT Short Term Goal 3 (Week 1): Pt will complete 1/3 components of toileting with supervision OT Short Term Goal 3 - Progress (Week 1): Met Week 2:  OT Short Term Goal 1 (Week 2): Pt will complete BSC transfer with 1 assist using LRAD to promote OOB toileting OT Short Term Goal 2 (Week 2): Pt will complete 2/3 components of toileting with supervision  Skilled Therapeutic Interventions/Progress Updates:    Pt received in wc dressed and ready for the day.  Pt self propelled wc to main gym as she wanted to try standing with the parallel bars.  Pt's wc positioned between bars and demonstrated to pt how she can rise to stand with use of her arms and LLE to follow RLE precautions. Set a goal of just lifting her hips.   Even with numerous trials and various hand positions, pt unable to lift hips at all.  Discussed what she will need to work on to be able to rise to stand.    Focused the rest of therapy on UE strength with red theraband for triceps and upper back.  Gentle hip add with ball squeeze between knees,  leg press with LLE using ball as resistance.  Pt propelled back to her room with all needs met.     Therapy Documentation Precautions:  Precautions Precautions: Fall, Anterior Hip, Other (comment) Precaution Comments: HD access port RUQ, L LE anterior hip precautions, R LE rehab protocol in pt's paper chart Required Braces or Orthoses: Knee  Immobilizer - Right Knee Immobilizer - Right: On at all times, Other (comment) Restrictions Weight Bearing Restrictions: Yes RLE Weight Bearing: Touchdown weight bearing LLE Weight Bearing: Weight bearing as tolerated Other Position/Activity Restrictions: L LE anterior hip precautions - no hip extension, hip external rotation, or abduction  Pain: Pain Assessment Pain Score: 3  - right knee, premedicated ADL: ADL Grooming: Setup Where Assessed-Grooming: Edge of bed Upper Body Bathing: Setup Where Assessed-Upper Body Bathing: Edge of bed Lower Body Bathing: Minimal assistance Where Assessed-Lower Body Bathing: Edge of bed Upper Body Dressing: Setup Where Assessed-Upper Body Dressing: Edge of bed Lower Body Dressing: Maximal assistance Where Assessed-Lower Body Dressing: Edge of bed Toileting: Not assessed Toilet Transfer: Not assessed Tub/Shower Transfer: Not assessed  Therapy/Group: Individual Therapy  Dierre Crevier 10/27/2021, 10:14 AM

## 2021-10-27 NOTE — Progress Notes (Signed)
Occupational Therapy Session Note  Patient Details  Name: [name] MRN: [id number] Date of Birth: 10/11/1965  Today's Date: 10/27/2021 OT Individual Time: 1102-1200 OT Individual Time Calculation (min): 58 min    Short Term Goals: Week 2:  OT Short Term Goal 1 (Week 2): Pt will complete BSC transfer with 1 assist using LRAD to promote OOB toileting OT Short Term Goal 2 (Week 2): Pt will complete 2/3 components of toileting with supervision  Skilled Therapeutic Interventions/Progress Updates:  Skilled OT intervention completed with extensive focus on emotional support for pt, education regarding rehab goals, POC, requirements of IPR with receiving OT/PT disciplines as mandated, and self-care/functional transfers.  Pt received seated in w/c, immediately stating to OT "you and me are through, you are not to work with me anymore." Therapist trying to understand pt's current state of anger with time spent in conversation, with pt ultimately stating that the toilet transfer on Friday during OT session was "degrading" to her. Therapist educated pt on purpose of the transfer with encouragement that the transfer went smoother than originally completed with previous OT. Pt stated "the poop on the floor" and "making me lean side to side was humiliating."  Pt demanding that she not have any OT anymore, with education provided on the IPR program with required 2/3 disciplines, with this therapist educating pt on catering rehab goals to more functional standing vs heavy self-care to increase pt's confidence and mutual respect/rapport with the therapeutic relationship.  Pt eventually agreeable to changing gears of OT focus, and to work with this OT. However, pt requesting to complete self care towards then end of our session... Pt completed hair grooming, curling and cosmetics at sink with supervision. Therapist changed pt's bed linens prior to pt entering the bed for transport to dialysis. Pt completed  lateral scoot from w/c > EOB with min A only for scooting hips further onto bed, then was able to bring BLEs up onto bed with increased time and supervision, with pt stating "Yay that's the first time I ever did that!". Pt was left seated upright in bed, with bed alarm on, 4 bed rails up per request, and all needs in reach at end of session.    Therapy Documentation Precautions:  Precautions Precautions: Fall, Anterior Hip, Other (comment) Precaution Comments: HD access port RUQ, L LE anterior hip precautions, R LE rehab protocol in pt's paper chart Required Braces or Orthoses: Knee Immobilizer - Right Knee Immobilizer - Right: On at all times, Other (comment) Restrictions Weight Bearing Restrictions: Yes RLE Weight Bearing: Touchdown weight bearing LLE Weight Bearing: Weight bearing as tolerated Other Position/Activity Restrictions: L LE anterior hip precautions - no hip extension, hip external rotation, or abduction  Pain: No c/o pain   Therapy/Group: Individual Therapy  [name] 10/27/2021, 7:36 AM

## 2021-10-27 NOTE — Progress Notes (Signed)
Physical Therapy Session Note  Patient Details  Name: [name] MRN: [id number] Date of Birth: 04-20-1966  Today's Date: 10/27/2021 PT Individual Time: 0800-0912 PT Individual Time Calculation (min): 72 min   Short Term Goals: Week 1:  PT Short Term Goal 1 (Week 1): Pt will perform supine<>sit with mod assist of 1 consistently PT Short Term Goal 1 - Progress (Week 1): Met PT Short Term Goal 2 (Week 1): Pt will perform bed<>chair transfers using LRAD with mod assist of 1 consistently PT Short Term Goal 2 - Progress (Week 1): Met PT Short Term Goal 3 (Week 1): Pt will perform sit<>stand using LRAD with +2 mod assist PT Short Term Goal 3 - Progress (Week 1): Met Week 2:  PT Short Term Goal 1 (Week 2): pt will transfer sit<>supine with LRAD and min A consistantly PT Short Term Goal 2 (Week 2): pt will perform transfers with LRAD and min A consistantly PT Short Term Goal 3 (Week 2): pt will initiate gait training  Skilled Therapeutic Interventions/Progress Updates:   Received pt sidelying in bed with NT assisting pt with hygiene management - PT took over with care. Pt agreeable to PT treatment and reported pain 4/10 in R knee - RN notified and present to administer pain medication during session. Session with emphasis on dressing, functional mobility/transfers, generalized strengthening and endurance, and dynamic standing balance/coordination. Donned L sock with max A and pt donned shorts and L shoe in supine via rolling with supervision and increased time. Pt transferred supine<>long sitting<>sitting EOB with supervision from flat bed and transferred bed<>WC via slideboard with CGA. Doffed dirty shirt and donned clean one seated with supervision. Pt transported to/from room in [facility] dependently for time management purposes. Pt transferred to mat via slideboard with CGA with total A to place board and performed the following exercises with emphasis on LE strength/ROM: -LLE LAQ 2x10 -LLE hip flexion  2x10 Sit<>stand from 23.5 in high mat with [place] Pier walker x 6 reps with mirror in front for visual feedback and [employer]irex underneath RLE to improve adherence to RLE TDWB precautions - emphasis on weight shifting to L to facilitate weight acceptance. Provided pt with bilateral platform RW and raised mat even higher and stood with PFRW x 1 rep then stated "I don't think I'm ready for this" and requested to return to using [place]arty walker. Discussed progression from Harm[place] walker<>platform RW<>RW and need to begin transitioning off Harmon Pier walker - MD arrived for morning rounds. Pt transferred mat<>WC via lateral scoot with CGA and returned to room. Concluded session with pt sitting in [place] with all needs within reach.   Therapy Documentation Precautions:  Precautions Precautions: Fall, Anterior Hip, Other (comment) Precaution Comments: HD access port RUQ, L LE anterior hip precautions, R LE rehab protocol in pt's paper chart Required Braces or Orthoses: Knee Immobilizer - Right Knee Immobilizer - Right: On at all times, Other (comment) Restrictions Weight Bearing Restrictions: Yes RLE Weight Bearing: Touchdown weight bearing LLE Weight Bearing: Weight bearing as tolerated Other Position/Activity Restrictions: L LE anterior hip precautions - no hip extension, hip external rotation, or abduction  Therapy/Group: Individual Therapy Alfonse Alpers PT, DPT   10/27/2021, 7:12 AM

## 2021-10-28 LAB — GLUCOSE, CAPILLARY
Glucose-Capillary: 163 mg/dL — ABNORMAL HIGH (ref 70–99)
Glucose-Capillary: 151 mg/dL — ABNORMAL HIGH (ref 70–99)
Glucose-Capillary: 150 mg/dL — ABNORMAL HIGH (ref 70–99)
Glucose-Capillary: 163 mg/dL — ABNORMAL HIGH (ref 70–99)

## 2021-10-28 MED ORDER — INSULIN GLARGINE-YFGN 100 UNIT/ML ~~LOC~~ SOLN
15.0000 [IU] | Freq: Every day | SUBCUTANEOUS | Status: DC
  Administered 2021-10-29: 15 [IU] via SUBCUTANEOUS
  Filled 2021-10-28: qty 0.15

## 2021-10-28 NOTE — Progress Notes (Signed)
PROGRESS NOTE   Subjective/Complaints: She is discouraged by conversation with [name] today in that she will not be able to meet her desired expectations upon d/c. She has been talking with [facility] to see if she can get home dialysis services  ROS: Denies CP, SOB, +incontinent stool, +right knee pain  Objective:   No results found. Recent Labs    10/27/21 1248  WBC 6.8  HGB 8.7*  HCT 27.6*  PLT 245       Recent Labs    10/27/21 1248  NA 136  K 5.0  CL 96*  CO2 23  GLUCOSE 202*  BUN 55*  CREATININE 7.31*  CALCIUM 9.1      Intake/Output Summary (Last 24 hours) at 10/28/2021 1420 Last data filed at 10/28/2021 0700 Gross per 24 hour  Intake 240 ml  Output 1415 ml  Net -1175 ml         Physical Exam: Vital Signs Blood pressure (!) 152/64, pulse 86, temperature 98.6 F (37 C), resp. rate 16, height 5\' 7"  (1.702 m), weight 76.5 kg, SpO2 100 %. Gen: no distress, normal appearing, BMI 26.35 HEENT: oral mucosa pink and moist, NCAT Cardio: Reg rate Chest: normal effort, normal rate of breathing Abd: soft, non-distended Ext: no edema Psych: pleasant, normal affect, anxiety, discouraged regarding slow progress Musculoskeletal:        General: Swelling and tenderness present.     Cervical back: Normal range of motion and neck supple.     Right lower leg: Edema present.     Left lower leg: Edema present.     Comments: Left hip tender to palpation and PROM with associated swelling. Left knee with mild effusion, patella laxity. Right hip tender with leg raise  Right knee tender and swollen, ace bandage in place Skin:    Comments: Incision site dressing clean dry and intact.  R IJ TDC with bandage Neuro: Alert Motor: Bilateral upper extremities: 5/5 proximal distal Bilateral lower extremities:  Right lower extremity: Limited by bracing, hip flexion?  2/5   Assessment/Plan: 1. Functional deficits which require  3+ hours per day of interdisciplinary therapy in a comprehensive inpatient rehab setting. Physiatrist is providing close team supervision and 24 hour management of active medical problems listed below. Physiatrist and rehab team continue to assess barriers to discharge/monitor patient progress toward functional and medical goals  Care Tool:  Bathing    Body parts bathed by patient: Face   Body parts bathed by helper: Left lower leg Body parts n/a: Right lower leg   Bathing assist Assist Level: Independent with assistive device     Upper Body Dressing/Undressing Upper body dressing   What is the patient wearing?: Dress    Upper body assist Assist Level: Set up assist    Lower Body Dressing/Undressing Lower body dressing      What is the patient wearing?: Pants     Lower body assist Assist for lower body dressing: Minimal Assistance - Patient > 75%     Toileting Toileting Toileting Activity did not occur (Clothing management and hygiene only): N/A (no void or bm)  Toileting assist Assist for toileting: Maximal Assistance - Patient 25 - 49%  Transfers Chair/bed transfer  Transfers assist     Chair/bed transfer assist level: Contact Guard/Touching assist Chair/bed transfer assistive device: Sliding board   Locomotion Ambulation   Ambulation assist   Ambulation activity did not occur: Safety/medical concerns          Walk 10 feet activity   Assist  Walk 10 feet activity did not occur: Safety/medical concerns        Walk 50 feet activity   Assist Walk 50 feet with 2 turns activity did not occur: Safety/medical concerns         Walk 150 feet activity   Assist Walk 150 feet activity did not occur: Safety/medical concerns         Walk 10 feet on uneven surface  activity   Assist Walk 10 feet on uneven surfaces activity did not occur: Safety/medical concerns         Wheelchair     Assist Is the patient using a wheelchair?:  Yes Type of Wheelchair: Manual    Wheelchair assist level: Supervision/Verbal cueing, Set up assist Max wheelchair distance: 136ft    Wheelchair 50 feet with 2 turns activity    Assist        Assist Level: Supervision/Verbal cueing   Wheelchair 150 feet activity     Assist      Assist Level: Supervision/Verbal cueing   Blood pressure (!) 152/64, pulse 86, temperature 98.6 F (37 C), resp. rate 16, height 5\' 7"  (1.702 m), weight 76.5 kg, SpO2 100 %.    Medical Problem List and Plan: 1. Functional deficits secondary to nondisplaced fracture right inferior pubic ramus as well as a displaced foreshortened fracture through the left femoral neck/intertrochanteric area.  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   -Weightbearing as tolerated with anterior total hip precautions.                                  -Conservative care right inferior pubic ramus fracture, weightbearing as tolerated             -patient may  shower             -ELOS/Goals: 3-4 weeks total, supervision to min assist goals  Continue CIR  HFU scheduled.  2.  Impaired mobility -DVT/anticoagulation:  Mechanical: Antiembolism stockings, thigh (TED hose) Bilateral lower extremities. Vascular ultrasound reviewed and negative for clot.  -should be able to begin lovenox today as permacath placed            -antiplatelet therapy: N/A 3. Femur fracture pain: conitnue Flector patch, Robaxin 750 mg 4 times daily, hydrocodone and oxycodone as needed             -pt having significant spasms in left thigh.             -add kpad to help with spasms  -schedule tylenol 1000mg  TID  Appears to be controlled on 2/25 4. Anxiety: Instructed on deep breathing techniques. Provide emotional support.  Melatonin as needed             -antipsychotic agents: N/A 5. Neuropsych: This patient is capable of making decisions on her own behalf. 6. Skin/Wound Care: Routine skin checks 7. Fluids/Electrolytes/Nutrition:  Routine in and outs with follow-up chemistries 8.  ESRD on HD.  Permacath exchanged 10/03/2021.    -  Follow-up hemodialysis per Dr. [name] -HD later in day to allow participation in therapies during the day Appreciate  nephro recs 9.  Acute on chronic anemia.  Has had transfusions already x 2. Hgb has stabilized in 7 range. Continue iron supplement.              -epo per nephrology  Hemoglobin 8.7 on 2/24, appreciate nephro recs 10.  Diabetes mellitus with peripheral neuropathy. Placed order that she may use her Dexcom 6. Hemoglobin A1c 6.2.  SSI as prior to admission. Provide dietary education.  CBG (last 3)  Recent Labs    10/27/21 2054 10/28/21 0546 10/28/21 1137  GLUCAP 251* 163* 151*  Some lability will monitor.Increase Semglee to 15U  11.  Hypertension. Cozaar increased Vitals:   10/28/21 0526 10/28/21 1418  BP: (!) 151/73 (!) 152/64  Pulse: 93 86  Resp: 20 16  Temp: 98.7 F (37.1 C) 98.6 F (37 C)  SpO2: 98% 100%   Mildly elevated, avoid overtreatment due to dialysis 12.  Hyperparathyroidism of renal origin.  Plan outpatient parathyroidectomy per Dr.Lateef 13.  Overweight.  BMI 29.11-->26.35  Dietary follow-up. Provided list of foods that can help with weight loss. Discussed benefits of intermittent fasting.  14. Cushingoid?--outpt work up 26. Constipation: resolved. Decrease colace to daily prn. D/c senna. Discussed benefits of high fiber foods. Continue bowel regimen. D/c miralax.  16. Right quadriceps tear: surgery complete with good healing on XR, minimal postop pain, discussed importance of participating in 3 hours therapy daily as much as she can tolerate and she is very motivated and excited for rehab, remove dressing today 17. Gas: continue simethicone prn 18. Ileus: resolved, regular diet resumed -tolerating.  19. Abd spasms: resolved 20. Black tarry stools: discussed heme occult negative last night. Hgb reviewed and stable.  21. Vitamin D deficiency: continue  ergocalciferol 50,000U once per week for 7 weeks. Discussed that this dose is higher than a daily supplement.  22. Left heel pain: discussed possible plantar fasciitis. Asked OT to give her exercises/medicine ball to roll foot on. 23. Stool incontinence: no signs of cauda equina 24. Impaired mobility and ADLs: discussed that home therapy will be 2-3 times per week.      LOS: 11 days A FACE TO FACE EVALUATION WAS PERFORMED  Martha Clan P Monie Shere 10/28/2021, 2:20 PM

## 2021-10-28 NOTE — Progress Notes (Signed)
Occupational Therapy Session Note  Patient Details  Name: [name] MRN: [id number] Date of Birth: [birth year]  Today's Date: 10/28/2021 OT Individual Time: 1003-1058 & 1315-1410 OT Individual Time Calculation (min): 55 min & 55 min OT missed time: 20 min Missed time reason: IPR meeting that was scheduled into pt's treatment time   Short Term Goals: Week 2:  OT Short Term Goal 1 (Week 2): Pt will complete BSC transfer with 1 assist using LRAD to promote OOB toileting OT Short Term Goal 2 (Week 2): Pt will complete 2/3 components of toileting with supervision  Skilled Therapeutic Interventions/Progress Updates:  Session 1 Skilled OT intervention completed with focus on home management/DME education, discussion of rehab goals and pt's CLOF. Pt received seated in w/c, denying pain, with several questions loaded about what this therapist recommends for her d/c function. Pt has frequently expressed unrealistic goals for herself that would significantly increase pt's husband's assist requirement and also decrease pt's safety, so this therapist spent extensive time in discussion with pt about OT recommendations. Therapist educated pt on her required assist level of standing from an extremely elevated height at max A, vs slideboard transfers with min A to multiple surfaces, as well as pt's increased independence with self-care at a seated level due to her inability to stand at this point from the w/c. Educated pt on the purpose of IPR, with goals to maximize pt's medical stability and independence, not to be 100% by d/c. Education provided on HHOT/PT purpose, current slow progress, and realistic goals, with pt responding "why can't we extend me until I can stand? I should get as long as it takes since y'all are the ones who tore my quad.." Education further provided about the need of steady progress and achievable goals, setting goals based off current status and therapists having to justifying the need  of keeping a pt in the hospital, not just allowing someone to stay for their personal goals. By end of conversation, pt had increased insight to her deficits, and was receptive to the idea of d/c home at w/c level to continue progressing at home once WB restrictions lifted.   Pt initiated the topic of BSC and transfers, with this therapist educating on [place] as an option with pt stating she wanted to try in PM session. Discussed with pt about her husband being present for hands on training with this to potentially make pt more comfortable and less "degraded" with this task. Pt left seated in w/c, with chair alarm on and all needs in reach at end of session.   Session 2 Skilled OT intervention completed with focus on family hands on training, toilet transfers, DME education. Pt received seated in w/c, agreeable to session. Therapist provided education to pt's husband regarding hands on technique for slideboard transfer to [facility], with demonstration provided at [facility]. Pt's husband able to return demonstrate CGA for slideboard transfer back to w/c however cues needed to educate the husband on close positioning to the pt, blocking the w/c from sliding, slideboard stabilization and proper body mechanics to ensure safety. Discussed with pt and family about the benefits of the BDABSC vs padded tub bench, with education provided on technique of LB dressing, toileting and pericare while on the commode with plan to practice this with pt for increased independence. Pt and pt's husband both agreeable about the w/c level recommendation for d/c, with strategies discussed on how to manage self-care at home in their environment. Educated pt on where to purchase DME  however plan to work with [name] to determine eligibility with insurance what would be covered with pt's supplemental vs medicare. Pt asking if pt's husband could assist her transfer back to bed with this therapist not clearing the husband at this time due to  only completing one transfer with cues still needed however plan to check [employer], the husband off towards end of week. Pt was left seated in w/c, with husband in room and therapist notifying pt that if they go to [employer] like discussed, to notify pt's nurse of leaving off the dept with her grounds pass. All immediate needs met at end of session.   Therapy Documentation Precautions:  Precautions Precautions: Fall, Anterior Hip, Other (comment) Precaution Comments: HD access port RUQ, L LE anterior hip precautions, R LE rehab protocol in pt's paper chart Required Braces or Orthoses: Knee Immobilizer - Right Knee Immobilizer - Right: On at all times, Other (comment) Restrictions Weight Bearing Restrictions: Yes RLE Weight Bearing: Touchdown weight bearing LLE Weight Bearing: Weight bearing as tolerated Other Position/Activity Restrictions: L LE anterior hip precautions - no hip extension, hip external rotation, or abduction    Therapy/Group: Individual Therapy  Dashiell Franchino E Loyce Klasen 10/28/2021, 7:43 AM

## 2021-10-28 NOTE — Progress Notes (Signed)
°  [facility] Progress Note   Subjective:   Seen in room.  Says she's a little discouraged by not being able to progress as well as she wants.     Objective Vitals:   10/27/21 1730 10/27/21 1800 10/27/21 2040 10/28/21 0526  BP: 117/65 128/69 (!) 145/56 (!) 151/73  Pulse: 86 85 95 93  Resp:  19 18 20   Temp:  97.6 F (36.4 C) 98 F (36.7 C) 98.7 F (37.1 C)  TempSrc:  Temporal Oral   SpO2:  98% 96% 98%  Weight:  76.1 kg  76.5 kg  Height:       Physical Exam General:chronically ill appearing female in NAD Heart:RRR, no mrg Lungs:CTAB, nml WOB Abdomen:soft, NTND Extremities:no LE edema, R leg in brace Dialysis Access: [facility]   Filed Weights   10/27/21 1446 10/27/21 1800 10/28/21 0526  Weight: 77.5 kg 76.1 kg 76.5 kg    Intake/Output Summary (Last 24 hours) at 10/28/2021 1340 Last data filed at 10/28/2021 0700 Gross per 24 hour  Intake 240 ml  Output 1415 ml  Net -1175 ml    Additional Objective Labs: Basic Metabolic Panel: Recent Labs  Lab 10/22/21 1318 10/24/21 0553 10/24/21 1326 10/27/21 1248  NA 136  --  135 136  K 4.3  --  4.5 5.0  CL 96*  --  96* 96*  CO2 24  --  22 23  GLUCOSE 221*  --  232* 202*  BUN 58*  --  52* 55*  CREATININE 6.92* 6.10* 6.40* 7.31*  CALCIUM 9.2  --  9.2 9.1  PHOS 5.0*  --  5.1* 4.9*   Liver Function Tests: Recent Labs  Lab 10/22/21 1318 10/24/21 1326 10/27/21 1248  ALBUMIN 3.1* 3.1* 3.3*   CBC: Recent Labs  Lab 10/24/21 1326 10/27/21 1248  WBC 6.4 6.8  HGB 8.7* 8.7*  HCT 27.5* 27.6*  MCV 100.4* 101.5*  PLT 235 245   Medications:   acetaminophen  650 mg Oral TID   Chlorhexidine Gluconate Cloth  6 each Topical Q0600   Chlorhexidine Gluconate Cloth  6 each Topical Q0600   darbepoetin (ARANESP) injection - DIALYSIS  60 mcg Intravenous Q Fri-HD   enoxaparin (LOVENOX) injection  30 mg Subcutaneous Q24H   insulin aspart  0-9 Units Subcutaneous TID WC   [START ON 10/29/2021] insulin glargine-yfgn  15 Units  Subcutaneous Daily   losartan  75 mg Oral Daily   Vitamin D (Ergocalciferol)  50,000 Units Oral Q7 days    Dialysis Orders: MWF [facility] [address] [phone])  3h 22min  81kg   Hep 3000 then 800u/hr  RIJ TDC    - Hep B SAg neg 2/8, covid neg 1/29   Assessment/Plan: Debility - rehab per CIR RLE tendon rupture - s/p surgery 2/16 L hip fracture/ R pelvic ramus fx - sp L hemiarthroplasty on 09/29/21 at [facility], then sent here for CIR ESRD - on HD MWF. next HD 10/29/21 BP/ volume - cozaar just increased, lower EDW as tolerated DM2 - per pmd Anemia ckd - Hgb trending down, last 8.7. S/p 1 unit prbc on 2/3.  Aranesp started 40 weekly on Fridays, increase dose 73mcg on 2/24 MBD ckd - Calcium and phos in goal.  Continue binders.  9. Nutrition - renal diet  [facility] 10/28/2021,1:40 PM  LOS: 11 days

## 2021-10-28 NOTE — Progress Notes (Signed)
Spoke to [employer], Therapist, sports at [employer]. [name] confirms that pt contacted clinic today to inquire about home dialysis services. [name] informed navigator that it appears that pt would not be able to possibly start home training until May. Therefore, pt will need to continue in-center HD upon d/c from CIR. Clinic aware of pt's d/c planned for 3/11 and pt will resume care at clinic on Monday. Pt has a MWF schedule with 10:45 chair time. Will assist as needed.  Melven Sartorius Renal Navigator [phone]

## 2021-10-28 NOTE — Progress Notes (Signed)
Physical Therapy Session Note  Patient Details  Name: [name] MRN: [id number] Date of Birth: 26-Jul-1966  Today's Date: 10/28/2021 PT Individual Time: 0801-0858 PT Individual Time Calculation (min): 57 min   Short Term Goals: Week 1:  PT Short Term Goal 1 (Week 1): Pt will perform supine<>sit with mod assist of 1 consistently PT Short Term Goal 1 - Progress (Week 1): Met PT Short Term Goal 2 (Week 1): Pt will perform bed<>chair transfers using LRAD with mod assist of 1 consistently PT Short Term Goal 2 - Progress (Week 1): Met PT Short Term Goal 3 (Week 1): Pt will perform sit<>stand using LRAD with +2 mod assist PT Short Term Goal 3 - Progress (Week 1): Met Week 2:  PT Short Term Goal 1 (Week 2): pt will transfer sit<>supine with LRAD and min A consistantly PT Short Term Goal 2 (Week 2): pt will perform transfers with LRAD and min A consistantly PT Short Term Goal 3 (Week 2): pt will initiate gait training  Skilled Therapeutic Interventions/Progress Updates:   Received pt semi-reclined in bed, pt agreeable to PT treatment, and reported pain 5-6/10 in R knee (premedicated). Session with emphasis on functional mobility/transfers, generalized strengthening and endurance, and standing balance/tolerance.  Extensive discussion had regarding pt's current limitations and difficulty standing with RLE TDWB precautions and pt's goal of being able to "walk out of here on crutches". Reminded pt of difficulty pt and pt's husband reporting having standing with crutches prior to hospital admission despite being able to use both legs - overall recommendation to make pt mod I from [facility] level and further progress ambulation with OPPT once WB restrictions have been lifted. Also discussed rehab protocol with expectation that pt will be able to begin PROM up to 30 degrees of knee flexion with PT starting on Thursday but still will not be able to place weight on RLE for additional 5-6 weeks.   Pt transferred  semi-reclined<>long sitting<>sitting EOB with supervision and donned L shoe with max A for time purposes. Pt transferred bed<>WC via lateral scoot with CGA and therapist stabilizing WC. Pt transported to/from room in [facility] dependently for time management purposes. Pt transferred WC<>mat via slideboard with CGA and total A to place board. Attempted standing from 22in high mat with Harmon Pier walker, however pt unable to achieve fully upright position and putting weight on RLE despite therapist providing max A and despite having [employer] under RLE. Raised mat to 23.5 in, placed mirror in front of pt, and pt stood x9 additional reps with [place] walker and min A fading to CGA with [employer]x underneath RLE and cues for anterior weight shifting and lateral weight shifting to LLE. Pt fatigued after standing and transferred mat<>WC via slideboard with CGA with total A to place board with therapist stabilizing WC. Concluded session with pt sitting in [facility] with all needs within reach.   Therapy Documentation Precautions:  Precautions Precautions: Fall, Anterior Hip, Other (comment) Precaution Comments: HD access port RUQ, L LE anterior hip precautions, R LE rehab protocol in pt's paper chart Required Braces or Orthoses: Knee Immobilizer - Right Knee Immobilizer - Right: On at all times, Other (comment) Restrictions Weight Bearing Restrictions: Yes RLE Weight Bearing: Touchdown weight bearing LLE Weight Bearing: Weight bearing as tolerated Other Position/Activity Restrictions: L LE anterior hip precautions - no hip extension, hip external rotation, or abduction  Therapy/Group: Individual Therapy Alfonse Alpers PT, DPT   10/28/2021, 7:39 AM

## 2021-10-29 LAB — CBC
HCT: 27.6 % — ABNORMAL LOW (ref 36.0–46.0)
Hemoglobin: 9.1 g/dL — ABNORMAL LOW (ref 12.0–15.0)
MCH: 33.2 pg (ref 26.0–34.0)
MCHC: 33 g/dL (ref 30.0–36.0)
MCV: 100.7 fL — ABNORMAL HIGH (ref 80.0–100.0)
Platelets: 261 10*3/uL (ref 150–400)
RBC: 2.74 MIL/uL — ABNORMAL LOW (ref 3.87–5.11)
RDW: 16.1 % — ABNORMAL HIGH (ref 11.5–15.5)
WBC: 6.6 10*3/uL (ref 4.0–10.5)
nRBC: 0 % (ref 0.0–0.2)

## 2021-10-29 LAB — RENAL FUNCTION PANEL
Albumin: 3.3 g/dL — ABNORMAL LOW (ref 3.5–5.0)
Anion gap: 14 (ref 5–15)
BUN: 41 mg/dL — ABNORMAL HIGH (ref 6–20)
CO2: 25 mmol/L (ref 22–32)
Calcium: 9.3 mg/dL (ref 8.9–10.3)
Chloride: 99 mmol/L (ref 98–111)
Creatinine, Ser: 6.07 mg/dL — ABNORMAL HIGH (ref 0.44–1.00)
GFR, Estimated: 8 mL/min — ABNORMAL LOW (ref >60)
Glucose, Bld: 184 mg/dL — ABNORMAL HIGH (ref 70–99)
Phosphorus: 4.6 mg/dL (ref 2.5–4.6)
Potassium: 4.2 mmol/L (ref 3.5–5.1)
Sodium: 138 mmol/L (ref 135–145)

## 2021-10-29 LAB — GLUCOSE, CAPILLARY
Glucose-Capillary: 153 mg/dL — ABNORMAL HIGH (ref 70–99)
Glucose-Capillary: 174 mg/dL — ABNORMAL HIGH (ref 70–99)
Glucose-Capillary: 198 mg/dL — ABNORMAL HIGH (ref 70–99)
Glucose-Capillary: 246 mg/dL — ABNORMAL HIGH (ref 70–99)

## 2021-10-29 MED ORDER — INSULIN GLARGINE-YFGN 100 UNIT/ML ~~LOC~~ SOLN
16.0000 [IU] | Freq: Every day | SUBCUTANEOUS | Status: DC
  Administered 2021-10-30: 16 [IU] via SUBCUTANEOUS
  Filled 2021-10-29: qty 0.16

## 2021-10-29 MED ORDER — HEPARIN SODIUM (PORCINE) 1000 UNIT/ML IJ SOLN
INTRAMUSCULAR | Status: AC
  Filled 2021-10-29: qty 3

## 2021-10-29 MED ORDER — LOSARTAN POTASSIUM 50 MG PO TABS
100.0000 mg | ORAL_TABLET | Freq: Every day | ORAL | Status: DC
  Administered 2021-10-30: 100 mg via ORAL
  Filled 2021-10-29: qty 2

## 2021-10-29 NOTE — Progress Notes (Signed)
Physical Therapy Session Note ? ?Patient Details  ?Name: [name] ?MRN: [id number] ?Date of Birth: Dec 01, 1965 ? ?Today's Date: 10/29/2021 ?PT Individual Time: 4270-6237 ?PT Individual Time Calculation (min): 55 min  ? ?Short Term Goals: ?Week 2:  PT Short Term Goal 1 (Week 2): pt will transfer sit<>supine with LRAD and min A consistantly ?PT Short Term Goal 2 (Week 2): pt will perform transfers with LRAD and min A consistantly ?PT Short Term Goal 3 (Week 2): pt will initiate gait training ? ?Skilled Therapeutic Interventions/Progress Updates: Pt presented sitting EOB agreeable to therapy. Pt states RLE with increased pain but premedicated and no additional intervention requested throughout session. Pt requesting to work on Sit to stand from bedside as Multimedia programmer present in room. Briefly discussed with pt obstacles of standing from soft bed vs mat with pt indicating has been attempting to EOB and although understands more challenging feels more comfortable as such. PTA then set up Harmon Pier walker and raised bed to 22.5in with pt performing x 6 Sit to stand from EOB. Pt unable to achieve full erect posture however on latter 3 and with verbal cues pt was able improve truncal extension. PTA also encouraged pt during stands 3-4 to perform TKE progressing to mini squats x 5. Pt did require increased time between bouts for recovery. During this time PTA discussed incorporating L hip strengthening secondary to s/p THA. PTA also encouraged pt while in w/c to initiate w/c push ups for UE strengthening as well as to improve motor planning to push up from armrests in preparation for standing from lower surfaces. Pt verbalized understanding and agreeable to attempt during extended rest breaks. At end of session pt left sitting EOB with bed alarm on, call bell within reach and needs met.  ?   ? ?Therapy Documentation ?Precautions:  ?Precautions ?Precautions: Fall, Anterior Hip, Other (comment) ?Precaution Comments: HD access port RUQ,  L LE anterior hip precautions, R LE rehab protocol in pt's paper chart ?Required Braces or Orthoses: Knee Immobilizer - Right ?Knee Immobilizer - Right: On at all times, Other (comment) ?Restrictions ?Weight Bearing Restrictions: Yes ?RLE Weight Bearing: Touchdown weight bearing ?LLE Weight Bearing: Weight bearing as tolerated ?Other Position/Activity Restrictions: L LE anterior hip precautions - no hip extension, hip external rotation, or abduction ?General: ?  ?Vital Signs: ?Therapy Vitals ?Temp: (!) 97.3 ?F (36.3 ?C) ?Pulse Rate: 94 ?Resp: 16 ?BP: (!) 97/58 ?Oxygen Therapy ?SpO2: 100 % ?O2 Device: Room Air ?Pain: ?  ?Mobility: ?  ?Locomotion : ?   ?Trunk/Postural Assessment : ?   ?Balance: ?  ?Exercises: ?  ?Other Treatments:   ? ? ? ?Therapy/Group: Individual Therapy ? ?[name] ?10/29/2021, 4:21 PM  ?

## 2021-10-29 NOTE — Progress Notes (Signed)
Physical Therapy Session Note ? ?Patient Details  ?Name: [name] ?MRN: [id number] ?Date of Birth: [birth year] ? ?Today's Date: 10/29/2021 ?PT Individual Time: 1135-1200 ?PT Individual Time Calculation (min): 25 min  ? ?Short Term Goals: ?Week 2:  PT Short Term Goal 1 (Week 2): pt will transfer sit<>supine with LRAD and min A consistantly ?PT Short Term Goal 2 (Week 2): pt will perform transfers with LRAD and min A consistantly ?PT Short Term Goal 3 (Week 2): pt will initiate gait training ? ?Skilled Therapeutic Interventions/Progress Updates:  ?   ?Pt received seated at EOB. Pt voices feelings of frustration toward this therapist based on previous interaction several weeks ago. PT provides active listening and attempts to clarify any confusion about intent from interaction. Pt cannot remember anything specific that was said during encounter, but frequently references "extremely rude tone". This therapist apologizes and pt agreeable to therapy, requesting to perform LAQs while seated at EOB. Pt performs 1x10 LAQs AROM with PT providing cues for positioning and optimal performance. Pt then performs x5 reps with 10 count hold in full extension. Pt perform sit to supine slowly with cues for sequencing, then scoots to [facility] with bed in trendelenburg position. Pt left semi reclined with alarm intact and all needs within reach. ? ?Therapy Documentation ?Precautions:  ?Precautions ?Precautions: Fall, Anterior Hip, Other (comment) ?Precaution Comments: HD access port RUQ, L LE anterior hip precautions, R LE rehab protocol in pt's paper chart ?Required Braces or Orthoses: Knee Immobilizer - Right ?Knee Immobilizer - Right: On at all times, Other (comment) ?Restrictions ?Weight Bearing Restrictions: Yes ?RLE Weight Bearing: Touchdown weight bearing ?LLE Weight Bearing: Weight bearing as tolerated ?Other Position/Activity Restrictions: L LE anterior hip precautions - no hip extension, hip external rotation, or  abduction ? ? ?Therapy/Group: Individual Therapy ? ?Breck Coons, PT, DPT ?10/29/2021, 12:34 PM  ?

## 2021-10-29 NOTE — Progress Notes (Signed)
SW met with patient and provided team conference updates. Sw informed pt that she is appropriate for car transfers to HD until home at home HD education is complete. Patient reports spouse came in for family education and did a great job, husband feel comfortable with transfers. Sw educated pt that she will d/c home at a WC level. No additional questions or concerns, sw will continue to follow up.  ?

## 2021-10-29 NOTE — Progress Notes (Signed)
?  [facility] ?Progress Note  ? ?Subjective:   Seen in room.  Says she's a little discouraged by not being able to progress as well as she wants.    ? ?Objective ?Vitals:  ? 10/28/21 0526 10/28/21 1418 10/28/21 1959 10/29/21 0448  ?BP: (!) 151/73 (!) 152/64 (!) 145/62 (!) 160/76  ?Pulse: 93 86 91 87  ?Resp: 20 16 16 16   ?Temp: 98.7 ?F (37.1 ?C) 98.6 ?F (37 ?C) 98.4 ?F (36.9 ?C) 98.3 ?F (36.8 ?C)  ?TempSrc:   Oral Oral  ?SpO2: 98% 100% 100% 100%  ?Weight: 76.5 kg     ?Height:      ? ?Physical Exam ?General:chronically ill appearing female in NAD ?Heart:RRR, no mrg ?Lungs:CTAB, nml WOB ?Abdomen:soft, NTND ?Extremities:no LE edema, R leg in brace ?Dialysis Access: [facility]  ? ?Filed Weights  ? 10/27/21 1446 10/27/21 1800 10/28/21 0526  ?Weight: 77.5 kg 76.1 kg 76.5 kg  ? ? ?Intake/Output Summary (Last 24 hours) at 10/29/2021 1307 ?Last data filed at 10/29/2021 0840 ?Gross per 24 hour  ?Intake 600 ml  ?Output --  ?Net 600 ml  ? ? ?Additional Objective ?Labs: ?Basic Metabolic Panel: ?Recent Labs  ?Lab 10/22/21 ?1318 10/24/21 ?7416 10/24/21 ?1326 10/27/21 ?1248  ?NA 136  --  135 136  ?K 4.3  --  4.5 5.0  ?CL 96*  --  96* 96*  ?CO2 24  --  22 23  ?GLUCOSE 221*  --  232* 202*  ?BUN 58*  --  52* 55*  ?CREATININE 6.92* 6.10* 6.40* 7.31*  ?CALCIUM 9.2  --  9.2 9.1  ?PHOS 5.0*  --  5.1* 4.9*  ? ?Liver Function Tests: ?Recent Labs  ?Lab 10/22/21 ?1318 10/24/21 ?1326 10/27/21 ?1248  ?ALBUMIN 3.1* 3.1* 3.3*  ? ?CBC: ?Recent Labs  ?Lab 10/24/21 ?1326 10/27/21 ?1248  ?WBC 6.4 6.8  ?HGB 8.7* 8.7*  ?HCT 27.5* 27.6*  ?MCV 100.4* 101.5*  ?PLT 235 245  ? ?Medications: ? ? acetaminophen  650 mg Oral TID  ? Chlorhexidine Gluconate Cloth  6 each Topical Q0600  ? Chlorhexidine Gluconate Cloth  6 each Topical Q0600  ? darbepoetin (ARANESP) injection - DIALYSIS  60 mcg Intravenous Q Fri-HD  ? enoxaparin (LOVENOX) injection  30 mg Subcutaneous Q24H  ? insulin aspart  0-9 Units Subcutaneous TID WC  ? [START ON 10/30/2021] insulin  glargine-yfgn  16 Units Subcutaneous Daily  ? losartan  75 mg Oral Daily  ? Vitamin D (Ergocalciferol)  50,000 Units Oral Q7 days  ? ? ?Dialysis Orders: ?MWF [place] [id number]) ? 3h 63min  81kg   Hep 3000 then 800u/hr  RIJ TDC ?   - Hep B SAg neg 2/8, covid neg 1/29 ?  ?Assessment/Plan: ?Debility - rehab per CIR ?RLE tendon rupture - s/p surgery 2/16 ?L hip fracture/ R pelvic ramus fx - sp L hemiarthroplasty on 09/29/21 at [facility], then sent here for CIR ?ESRD - on HD MWF. next HD 10/29/21 ?BP/ volume - increase cozaar to 100 mg daily ?DM2 - per pmd ?Anemia ckd - Hgb trending down, last 8.7. S/p 1 unit prbc on 2/3.  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  Continue binders.  ?9. Nutrition - renal diet ? ?Madelon Lips MD ?[facility] ?10/29/2021,1:07 PM ? LOS: 12 days  ? ? ?

## 2021-10-29 NOTE — Progress Notes (Signed)
PROGRESS NOTE   Subjective/Complaints: [name] is concerned about her current discharge date- she feels like she would benefit from 2 extra weeks here so that she can transfer to the car to HD  ROS: Denies CP, SOB, +incontinent stool, +right knee pain  Objective:   No results found. Recent Labs    10/27/21 1248  WBC 6.8  HGB 8.7*  HCT 27.6*  PLT 245       Recent Labs    10/27/21 1248  NA 136  K 5.0  CL 96*  CO2 23  GLUCOSE 202*  BUN 55*  CREATININE 7.31*  CALCIUM 9.1      Intake/Output Summary (Last 24 hours) at 10/29/2021 1146 Last data filed at 10/29/2021 0840 Gross per 24 hour  Intake 600 ml  Output --  Net 600 ml         Physical Exam: Vital Signs Blood pressure (!) 160/76, pulse 87, temperature 98.3 F (36.8 C), temperature source Oral, resp. rate 16, height 5\' 7"  (1.702 m), weight 76.5 kg, SpO2 100 %. Gen: no distress, normal appearing, BMI 26.35 HEENT: oral mucosa pink and moist, NCAT Cardio: Reg rate Chest: normal effort, normal rate of breathing Abd: soft, non-distended Ext: no edema Psych: pleasant, normal affect, anxiety, discouraged regarding slow progress Musculoskeletal:        General: Swelling and tenderness present.     Cervical back: Normal range of motion and neck supple.     Right lower leg: Edema present.     Left lower leg: Edema present.     Comments: Left hip tender to palpation and PROM with associated swelling. Left knee with mild effusion, patella laxity. Right hip tender with leg raise  Right knee tender and swollen, ace bandage in place Skin:    Comments: Incision site dressing clean dry and intact.  R IJ TDC with bandage Neuro: Alert Motor: Bilateral upper extremities: 5/5 proximal distal Bilateral lower extremities:  Right lower extremity: Limited by bracing, hip flexion?  2/5   Assessment/Plan: 1. Functional deficits which require 3+ hours per day of  interdisciplinary therapy in a comprehensive inpatient rehab setting. Physiatrist is providing close team supervision and 24 hour management of active medical problems listed below. Physiatrist and rehab team continue to assess barriers to discharge/monitor patient progress toward functional and medical goals  Care Tool:  Bathing    Body parts bathed by patient: Face   Body parts bathed by helper: Left lower leg Body parts n/a: Right lower leg   Bathing assist Assist Level: Independent with assistive device     Upper Body Dressing/Undressing Upper body dressing   What is the patient wearing?: Dress    Upper body assist Assist Level: Set up assist    Lower Body Dressing/Undressing Lower body dressing      What is the patient wearing?: Pants     Lower body assist Assist for lower body dressing: Minimal Assistance - Patient > 75%     Toileting Toileting Toileting Activity did not occur (Clothing management and hygiene only): N/A (no void or bm)  Toileting assist Assist for toileting: Maximal Assistance - Patient 25 - 49%     Transfers  Chair/bed transfer  Transfers assist     Chair/bed transfer assist level: Contact Guard/Touching assist Chair/bed transfer assistive device: Sliding board   Locomotion Ambulation   Ambulation assist   Ambulation activity did not occur: Safety/medical concerns          Walk 10 feet activity   Assist  Walk 10 feet activity did not occur: Safety/medical concerns        Walk 50 feet activity   Assist Walk 50 feet with 2 turns activity did not occur: Safety/medical concerns         Walk 150 feet activity   Assist Walk 150 feet activity did not occur: Safety/medical concerns         Walk 10 feet on uneven surface  activity   Assist Walk 10 feet on uneven surfaces activity did not occur: Safety/medical concerns         Wheelchair     Assist Is the patient using a wheelchair?: Yes Type of  Wheelchair: Manual    Wheelchair assist level: Supervision/Verbal cueing, Set up assist Max wheelchair distance: 125ft    Wheelchair 50 feet with 2 turns activity    Assist        Assist Level: Supervision/Verbal cueing   Wheelchair 150 feet activity     Assist      Assist Level: Supervision/Verbal cueing   Blood pressure (!) 160/76, pulse 87, temperature 98.3 F (36.8 C), temperature source Oral, resp. rate 16, height 5\' 7"  (1.702 m), weight 76.5 kg, SpO2 100 %.    Medical Problem List and Plan: 1. Functional deficits secondary to nondisplaced fracture right inferior pubic ramus as well as a displaced foreshortened fracture through the left femoral neck/intertrochanteric area.  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   -Weightbearing as tolerated with anterior total hip precautions.                                  -Conservative care right inferior pubic ramus fracture, weightbearing as tolerated             -patient may  shower             -ELOS/Goals: 3-4 weeks total, supervision to min assist goals  Continue CIR  HFU scheduled. -Interdisciplinary Team Conference today    2.  Impaired mobility -DVT/anticoagulation:  Mechanical: Antiembolism stockings, thigh (TED hose) Bilateral lower extremities. Vascular ultrasound reviewed and negative for clot.  -should be able to begin lovenox today as permacath placed            -antiplatelet therapy: N/A 3. Femur fracture pain: conitnue Flector patch, Robaxin 750 mg 4 times daily, hydrocodone and oxycodone as needed             -pt having significant spasms in left thigh.             -add kpad to help with spasms  -schedule tylenol 1000mg  TID  Appears to be controlled on 2/25 4. Anxiety: Instructed on deep breathing techniques. Provide emotional support.  Melatonin as needed             -antipsychotic agents: N/A 5. Neuropsych: This patient is capable of making decisions on her own behalf. 6. Skin/Wound  Care: Routine skin checks 7. Fluids/Electrolytes/Nutrition: Routine in and outs with follow-up chemistries 8.  ESRD on HD.  Permacath exchanged 10/03/2021.    -  Follow-up hemodialysis per Dr. [name] -HD later in day to  allow participation in therapies during the day Appreciate nephro recs 9.  Acute on chronic anemia.  Has had transfusions already x 2. Hgb has stabilized in 7 range. Continue iron supplement.              -epo per nephrology  Hemoglobin 8.7 on 2/24, appreciate nephro recs 10.  Diabetes mellitus with peripheral neuropathy. Placed order that she may use her Dexcom 6. Hemoglobin A1c 6.2.  SSI as prior to admission. Provide dietary education.  CBG (last 3)  Recent Labs    10/28/21 1714 10/28/21 2054 10/29/21 0608  GLUCAP 150* 163* 153*  Some lability will monitor. Increase Semglee to 16U  11.  Hypertension. Continue Cozaar,increased Vitals:   10/28/21 1959 10/29/21 0448  BP: (!) 145/62 (!) 160/76  Pulse: 91 87  Resp: 16 16  Temp: 98.4 F (36.9 C) 98.3 F (36.8 C)  SpO2: 100% 100%   Mildly elevated, avoid overtreatment due to dialysis 12.  Hyperparathyroidism of renal origin.  Plan outpatient parathyroidectomy per Dr.Lateef 13.  Overweight.  BMI 29.11-->26.35  Dietary follow-up. Provided list of foods that can help with weight loss. Discussed benefits of intermittent fasting.  14. Cushingoid?--outpt work up 57. Constipation: resolved. Decrease colace to daily prn. D/c senna. Discussed benefits of high fiber foods. Continue bowel regimen. D/c miralax.  16. Right quadriceps tear: surgery complete with good healing on XR, minimal postop pain, discussed importance of participating in 3 hours therapy daily as much as she can tolerate and she is very motivated and excited for rehab, remove dressing today 17. Gas: continue simethicone prn 18. Ileus: resolved, regular diet resumed -tolerating.  19. Abd spasms: resolved 20. Black tarry stools: discussed heme occult negative  last night. Hgb reviewed and stable.  21. Vitamin D deficiency: continue ergocalciferol 50,000U once per week for 7 weeks. Discussed that this dose is higher than a daily supplement.  22. Left heel pain: discussed possible plantar fasciitis. Asked OT to give her exercises/medicine ball to roll foot on. 23. Stool incontinence: no signs of cauda equina 24. Impaired mobility and ADLs: discussed that home therapy will be 2-3 times per week.      LOS: 12 days A FACE TO FACE EVALUATION WAS PERFORMED  [name] 10/29/2021, 11:46 AM

## 2021-10-29 NOTE — Progress Notes (Signed)
Occupational Therapy Session Note ? ?Patient Details  ?Name: [name] ?MRN: [id number] ?Date of Birth: 14-Nov-1965 ? ?Today's Date: 10/29/2021 ?OT Individual Time: 9211-9417 ?OT Individual Time Calculation (min): 53 min  ? ? ?Short Term Goals: ?Week 2:  OT Short Term Goal 1 (Week 2): Pt will complete BSC transfer with 1 assist using LRAD to promote OOB toileting ?OT Short Term Goal 2 (Week 2): Pt will complete 2/3 components of toileting with supervision ? ?Skilled Therapeutic Interventions/Progress Updates:  ?Skilled OT intervention completed with focus on toilet transfers, simulated LB clothing management for seated toileting. Pt received seated EOB, with MD in room for rounds. Pt agreeable to session. Despite discussion yesterday about transfer going smoother to BDABSC vs padded tub bench, pt reported that she and her husband purchased the padded bench with a hole for purpose of toileting at home. Pt stating "my butt was so sore after sitting on that hard one that we decided to do the padded, otherwise I won't be able to poop." Pt requested to transfer to padded bench during session. ? ?Pt completed slideboard transfer with Max A for slideboard placement, then CGA transfer from EOB <> padded bench, with pt having good safety awareness and self-management of BLEs throughout. Less cues needed for safety with preventing finger impingement from board. While on commode, education provided about lateral leans for use of pericare and clothing management. Educated on technique of threading a brief with fasteners vs pull up, with technique demonstrated. To simulate donning underwear while seated, therapist applied theraband around pt's thighs, with pt instructed to laterally lean to donn up over hips. Pt able to lean L<>R with supervision, with R leg propped on trash can for RLE comfort. Pt had increased difficulty with clearing the band from her bottom, with cues needed to lift hip and pull from posterior to help with  clearance however pt ultimately needed min A for scooping up in the back. Education provided to pt on wearing long shirt or gown vs pants to ease toileting management as well. While EOB, pt participated in self-care with set up A, seated. Pt was left seated EOB, with bed alarm on and all needs in reach at end of session. ? ?Note- pt reported that her husband transferred her back to bed after visiting cafe yesterday despite therapist instructing them not to due to not being cleared. Pt stated "I know you told us not to, be we didn't care." Therapist re-educated pt that she has to have staff member assist her for safety and liability reasons at this time. ? ?Therapy Documentation ?Precautions:  ?Precautions ?Precautions: Fall, Anterior Hip, Other (comment) ?Precaution Comments: HD access port RUQ, L LE anterior hip precautions, R LE rehab protocol in pt's paper chart ?Required Braces or Orthoses: Knee Immobilizer - Right ?Knee Immobilizer - Right: On at all times, Other (comment) ?Restrictions ?Weight Bearing Restrictions: Yes ?RLE Weight Bearing: Touchdown weight bearing ?LLE Weight Bearing: Weight bearing as tolerated ?Other Position/Activity Restrictions: L LE anterior hip precautions - no hip extension, hip external rotation, or abduction ? ?Pain: ?No c/o pain ? ? ?Therapy/Group: Individual Therapy ? ?Yobani Schertzer E Darly Massi ?10/29/2021, 7:41 AM ?

## 2021-10-29 NOTE — Progress Notes (Signed)
Physical Therapy Session Note ? ?Patient Details  ?Name: [name] ?MRN: [id number] ?Date of Birth: [birth year] ? ?Today's Date: 10/29/2021 ?PT Individual Time: 7121-9758 ?PT Individual Time Calculation (min): 58 min  ? ?Short Term Goals: ?Week 1:  PT Short Term Goal 1 (Week 1): Pt will perform supine<>sit with mod assist of 1 consistently ?PT Short Term Goal 1 - Progress (Week 1): Met ?PT Short Term Goal 2 (Week 1): Pt will perform bed<>chair transfers using LRAD with mod assist of 1 consistently ?PT Short Term Goal 2 - Progress (Week 1): Met ?PT Short Term Goal 3 (Week 1): Pt will perform sit<>stand using LRAD with +2 mod assist ?PT Short Term Goal 3 - Progress (Week 1): Met ?Week 2:  PT Short Term Goal 1 (Week 2): pt will transfer sit<>supine with LRAD and min A consistantly ?PT Short Term Goal 2 (Week 2): pt will perform transfers with LRAD and min A consistantly ?PT Short Term Goal 3 (Week 2): pt will initiate gait training ? ?Skilled Therapeutic Interventions/Progress Updates:  ?  Pt recd long sitting in bed with bledsoe brace in place. Pt reports no pain at rest, but 8/10 R knee pain with mobility. Pt had requested pain medication prior to session but had not seen her nurse yet this morning. Therapist attempted to find nurse and was unable. Could not find nurse, [place], until end of session when he had to be asked again to administer pain medication. ? ?Pt performed slideboard transfer with CGA for transfer EOB<>w/c<>mat table. Pt able to place board with CGA  and incr time at beginning of session but had difficulty requiring min A at end of session d/t fatigue.  ? ?Pt propelled w/c with BUE to/from day room for endurance and functional mobility. Pt managed w/c parts and set up for transfers with supervision with VC and occ assist for technique.  ? ?Session focused on Sit to stand from 23" mat table with [name] walker and RLE placed on airex to encourage WB precautions. Sit to stand x 5 with 2 of those with  slightly elevated [name] walker to encourage upright posture. Min to as little as CGA x 1 with lower walker, heavy min with elevated walker. Pt reports much difficulty with trunk in slightly more upright position, but discussed importance of getting those muscles active.  ? ?Pt returned to room and to EOB as detailed above, was left with all needs in reach and alarm active.  ? ? ? ?Therapy Documentation ?Precautions:  ?Precautions ?Precautions: Fall, Anterior Hip, Other (comment) ?Precaution Comments: HD access port RUQ, L LE anterior hip precautions, R LE rehab protocol in pt's paper chart ?Required Braces or Orthoses: Knee Immobilizer - Right ?Knee Immobilizer - Right: On at all times, Other (comment) ?Restrictions ?Weight Bearing Restrictions: Yes ?RLE Weight Bearing: Touchdown weight bearing ?LLE Weight Bearing: Weight bearing as tolerated ?Other Position/Activity Restrictions: L LE anterior hip precautions - no hip extension, hip external rotation, or abduction ? ? ? ?Therapy/Group: Individual Therapy ? ?[place] ?10/29/2021, 9:35 AM  ?

## 2021-10-30 ENCOUNTER — Telehealth: Payer: Self-pay | Admitting: Physical Medicine and Rehabilitation

## 2021-10-30 LAB — GLUCOSE, CAPILLARY
Glucose-Capillary: 159 mg/dL — ABNORMAL HIGH (ref 70–99)
Glucose-Capillary: 136 mg/dL — ABNORMAL HIGH (ref 70–99)
Glucose-Capillary: 250 mg/dL — ABNORMAL HIGH (ref 70–99)
Glucose-Capillary: 144 mg/dL — ABNORMAL HIGH (ref 70–99)

## 2021-10-30 MED ORDER — INSULIN GLARGINE-YFGN 100 UNIT/ML ~~LOC~~ SOLN
17.0000 [IU] | Freq: Every day | SUBCUTANEOUS | Status: DC
  Administered 2021-10-31: 17 [IU] via SUBCUTANEOUS
  Filled 2021-10-30: qty 0.17

## 2021-10-30 NOTE — Progress Notes (Signed)
Physical Therapy Session Note ? ?Patient Details  ?Name: [name] ?MRN: [id number] ?Date of Birth: Mar 22, 1966 ? ?Today's Date: 10/30/2021 ?PT Individual Time: 8250-0370 ?PT Individual Time Calculation (min): 59 min  ? ?Short Term Goals: ?Week 1:  PT Short Term Goal 1 (Week 1): Pt will perform supine<>sit with mod assist of 1 consistently ?PT Short Term Goal 1 - Progress (Week 1): Met ?PT Short Term Goal 2 (Week 1): Pt will perform bed<>chair transfers using LRAD with mod assist of 1 consistently ?PT Short Term Goal 2 - Progress (Week 1): Met ?PT Short Term Goal 3 (Week 1): Pt will perform sit<>stand using LRAD with +2 mod assist ?PT Short Term Goal 3 - Progress (Week 1): Met ?Week 2:  PT Short Term Goal 1 (Week 2): pt will transfer sit<>supine with LRAD and min A consistantly ?PT Short Term Goal 2 (Week 2): pt will perform transfers with LRAD and min A consistantly ?PT Short Term Goal 3 (Week 2): pt will initiate gait training ? ?Skilled Therapeutic Interventions/Progress Updates:  ? Received pt semi-reclined in bed, pt agreeable to PT treatment, and did not state pain level during session. Session with emphasis on functional mobility/transfers and generalized strengthening and endurance. Pt transferred semi-reclined<>long sitting<>sitting EOB with supervision and transferred bed<>WC via slideboard with CGA. Pt with questions regarding rehab protocol, stating that she has a copy of Dr. Dierdre [name] rehab protocol saying she can begin knee flexion ROM today - upon searching in chart, could not find any information other that "TDWB with knee locked in extension" and to refer to protocol in paper chart. Significantly increased time spent during session trying to figure out protocol and spoke with Dr. Ranell [name], case manager, and PA, [name]. Pt transported to/from room in [facility] dependently for time management purposes. Pt transferred WC<>mat to L via slideboard with CGA and increased time and effort and transitioned  sit<>semi-reclined in wedge with min A to assist in scooting hips back on mat to stretch legs out - of note pt with increased difficulty scooting backwards. Pt then performed x8 AAROM heel slides on LLE using UEs to assist in moving LLE. Pt transferred semi-reclined<>sitting EOB via forward scooting with supervision and transferred mat <> WC via slideboard with CGA. PA, [name] arrived and stated to hold on progressing rehab protocol until verbal confirmation is given from Dr. Mable [name]. Concluded session with pt sitting in [facility] with all needs within reach.  ? ?*Called Dr. Dierdre [name] office and left message for return call with confirmation of rehab protocol.  ? ?Therapy Documentation ?Precautions:  ?Precautions ?Precautions: Fall, Anterior Hip, Other (comment) ?Precaution Comments: HD access port RUQ, L LE anterior hip precautions, R LE rehab protocol in pt's paper chart ?Required Braces or Orthoses: Knee Immobilizer - Right ?Knee Immobilizer - Right: On at all times, Other (comment) ?Restrictions ?Weight Bearing Restrictions: Yes ?RLE Weight Bearing: Touchdown weight bearing ?LLE Weight Bearing: Weight bearing as tolerated ?Other Position/Activity Restrictions: L LE anterior hip precautions - no hip extension, hip external rotation, or abduction ? ? ?Therapy/Group: Individual Therapy ?Blenda Nicely ?Becky Sax PT, DPT  ? ?10/30/2021, 7:17 AM  ?

## 2021-10-30 NOTE — Progress Notes (Signed)
Occupational Therapy Session Note ? ?Patient Details  ?Name: [name] ?MRN: [id number] ?Date of Birth: 12/18/1965 ? ?Today's Date: 10/30/2021 ?OT Individual Time: 0900-1000 ?OT Individual Time Calculation (min): 60 min  ? ? ?Short Term Goals: ?Week 1:  OT Short Term Goal 1 (Week 1): Pt will complete LB dressing with Mod A using AE as needed ?OT Short Term Goal 1 - Progress (Week 1): Met ?OT Short Term Goal 2 (Week 1): Pt will complete BSC transfer with 1 assist using LRAD to promote OOB toileting ?OT Short Term Goal 2 - Progress (Week 1): Not met ?OT Short Term Goal 3 (Week 1): Pt will complete 1/3 components of toileting with supervision ?OT Short Term Goal 3 - Progress (Week 1): Met ?Week 2:  OT Short Term Goal 1 (Week 2): Pt will complete BSC transfer with 1 assist using LRAD to promote OOB toileting ?OT Short Term Goal 2 (Week 2): Pt will complete 2/3 components of toileting with supervision ? ?Skilled Therapeutic Interventions/Progress Updates:  ? Upon arrival patient seated at w/c LOF presents with a great outlook on life and motivated towards goal attain.  Patient used the w/c to roll to the sink area, pt required s/u for bathing at sink and for the completion of simple grooming task.  Patient was able to blow dry and curl her hair with s/u only.  Patient completed a w/c to bed transfer using the sliding board with CGA only for positioning and safety.  The pt was able to transfer from w/c to bed and back to w/c at the same LOF and complete adherence to BLE precautions. The pt was instructucted in UB exercise to improve strength for safe and independent funtion with BADL related task.  The pt complete 2 sets of 15 for chest press, shld flexion, and horizontal abduction.  The pt remained at w/c LOF at the close of treatment, with no c/o pain, bedside table within reach, and call light available and all addition needs addressed.  ? ?Therapy Documentation ?Precautions:  ?Precautions ?Precautions: Fall, Anterior  Hip, Other (comment) ?Precaution Comments: HD access port RUQ, L LE anterior hip precautions, R LE rehab protocol in pt's paper chart ?Required Braces or Orthoses: Knee Immobilizer - Right ?Knee Immobilizer - Right: On at all times, Other (comment) ?Restrictions ?Weight Bearing Restrictions: Yes ?RLE Weight Bearing: Touchdown weight bearing ?LLE Weight Bearing: Weight bearing as tolerated ?Other Position/Activity Restrictions: L LE anterior hip precautions - no hip extension, hip external rotation, or abduction ? ?Lorenda D Jackson ?10/30/2021, 10:00 AM ?

## 2021-10-30 NOTE — Progress Notes (Signed)
Patient ID: [name], female   DOB: 1965/10/03, 56 y.o.   MRN: [id number] ? ?Sw made attempt to call pt spouse to arrange family edu and discuss ramp progress. Sw will continue to follow up ?

## 2021-10-30 NOTE — Progress Notes (Signed)
Patient ID: [name], female   DOB: 27-Nov-1965, 56 y.o.   MRN: [id number] ? ?Family education scheduled Tuesday 1-3 PM ?

## 2021-10-30 NOTE — Progress Notes (Signed)
Occupational Therapy Session Note ? ?Patient Details  ?Name: [name] ?MRN: [id number] ?Date of Birth: [birth year] ? ?Today's Date: 10/30/2021 ?OT Individual Time: 3335-4562 ?OT Individual Time Calculation (min): 53 min  ? ? ?Short Term Goals: ?Week 2:  OT Short Term Goal 1 (Week 2): Pt will complete BSC transfer with 1 assist using LRAD to promote OOB toileting ?OT Short Term Goal 2 (Week 2): Pt will complete 2/3 components of toileting with supervision ? ?Skilled Therapeutic Interventions/Progress Updates:  ?Skilled OT intervention completed with focus on transfer education, RLE ROM/strengthening. Pt received seated in w/c, agreeable to session. Pt with questions about d/c planning and re-buttling the date determined by care team. Therapist educated pt on need of her husband bringing low car for family ed and transfer practice to get into the car to prevent any issues upon d/c at determined date, as well as notifying care team and [name] that per report her husband hasn't even began building the ramp yet. Education provided about pt's question regarding having a lift chair recommended, with safety implications expressed and reminding pt of no standing transfers once d/c home due to assist level needed and inability to do so. ? ?Therapist took pt's measurements for PT to order correct size w/c. With 19 in at hips and 13 in at leg, PT informed. ? ?Pt requested to work on LLE strengthening with the following completed to promote activation of quads/glutes/hamstrings needed for standing: ? ?(Unweighted) ?Toe taps forward x20 ?Knee extension x10 ? ?(With 1/2 pound ankle weight and orange theraband for AAROM and offloading for increased ROM) ?Hamstring pulls 2x20 ?Hip flexion/foot lifts 2x20 ?Knee extension 2x20 ? ?Pt required cues for form and positioning. Pt was left seated in w/c, with NT in room assessing pt's CBG. Pt left in care of NT at end of session. ? ? ?Therapy Documentation ?Precautions:   ?Precautions ?Precautions: Fall, Anterior Hip, Other (comment) ?Precaution Comments: HD access port RUQ, L LE anterior hip precautions, R LE rehab protocol in pt's paper chart ?Required Braces or Orthoses: Knee Immobilizer - Right ?Knee Immobilizer - Right: On at all times, Other (comment) ?Restrictions ?Weight Bearing Restrictions: Yes ?RLE Weight Bearing: Touchdown weight bearing ?LLE Weight Bearing: Weight bearing as tolerated ?Other Position/Activity Restrictions: L LE anterior hip precautions - no hip extension, hip external rotation, or abduction ? ?Pain: ?No c/o pain ? ?Therapy/Group: Individual Therapy ? ?Bryant Saye E Rashan Patient ?10/30/2021, 7:45 AM ?

## 2021-10-30 NOTE — Progress Notes (Signed)
Occupational Therapy Session Note ? ?Patient Details  ?Name: [name] ?MRN: [id number] ?Date of Birth: [birth year] ? ?Today's Date: 10/31/2021 ?OT Individual Time: 0272-5366 ?OT Individual Time Calculation (min): 59 min ? ? ?Skilled Therapeutic Interventions/Progress Updates:  ?  Pt greeted in bed, stating that she just had an xray on her Lt foot and asked OT to look on Epic to see results. OT consulted with RN and also PA regarding imaging. Dr. Ranell Patrick called and ordered MRI due to fracture concern. Per MD, therapy ok to pts tolerance with no restrictions at this time. Educated pt that medical team was planning to send her to MRI and pt reported feeling "depressed" about this. Pt really hoped that she would be d/c home at a walker vs wheelchair level. Therapeutic use of self and supportive listening provided to address psychosocial health. Pt agreeable to transfer to the w/c and to do something "fun." CGA for slideboard<w/c, pt able to place board herself and scoot with therapist steadying board only. Pt able to manage her Rt knee brace. OT donned Lt shoe prior to transfer to provide more support for the Lt foot. OT escorted pt down to the gift shop in the atrium. Pt did well navigating around obstacles and maneuvering w/c in tight spaces while window shopping. Discussed using therapeutic activity at home to nourish psychosocial health. Pt has multiple hobbies including puzzle assembly, jewelry making (using gemstones), and listening to [employer]. Reminded pt that she is still capable of engaging in these leisure occupations at a Mod I level despite current deficits. Discussed stretches to perform at rest during w/c propulsion as well, pt with hx Rt RTC injury. Pt self propelled the w/c back to the unit, taking 2 rest breaks en route. Slideboard<bed completed with CGA once again for steadying board, pt this time needing more cuing for board placement to avoid wheel. Pt able to lift both LEs into bed. Left her  in care of NT to obtain blood sugar. Tx focus placed on activity tolerance, transfer training, pt education, and psychosocial wellbeing.  ? ?Therapy Documentation ?Precautions:  ?Precautions ?Precautions: Fall, Anterior Hip, Other (comment) ?Precaution Comments: HD access port RUQ, L LE anterior hip precautions, R LE rehab protocol in pt's paper chart ?Required Braces or Orthoses: Knee Immobilizer - Right ?Knee Immobilizer - Right: On at all times, Other (comment) ?Restrictions ?Weight Bearing Restrictions: Yes ?RLE Weight Bearing: Touchdown weight bearing ?LLE Weight Bearing: Weight bearing as tolerated ?Other Position/Activity Restrictions: L LE anterior hip precautions - no hip extension, hip external rotation, or abduction ? ?Pain: no c/o pain during tx ?  ?ADL: ?ADL ?Grooming: Setup ?Where Assessed-Grooming: Edge of bed ?Upper Body Bathing: Setup ?Where Assessed-Upper Body Bathing: Edge of bed ?Lower Body Bathing: Minimal assistance ?Where Assessed-Lower Body Bathing: Edge of bed ?Upper Body Dressing: Setup ?Where Assessed-Upper Body Dressing: Edge of bed ?Lower Body Dressing: Maximal assistance ?Where Assessed-Lower Body Dressing: Edge of bed ?Toileting: Not assessed ?Toilet Transfer: Not assessed ?Tub/Shower Transfer: Not assessed ?  ? ? ?Therapy/Group: Individual Therapy ? ?[name] [name] ?10/31/2021, 12:53 PM ?

## 2021-10-30 NOTE — Patient Care Conference (Signed)
Inpatient RehabilitationTeam Conference and Plan of Care Update ?Date: 10/30/2021   Time: 11:40 AM   ? ? ?Patient Name: [name]      ?Medical Record Number: [id number]  ?Date of Birth: 1966/08/30 ?Sex: Female         ?Room/Bed: 2P53I/1W43X-54 ?Payor Info: Payor: MEDICARE / Plan: MEDICARE PART A AND B / Product Type: *No Product type* /   ? ?Admit Date/Time:  10/17/2021 12:48 PM ? ?Primary Diagnosis:  Intertrochanteric fracture of left hip ([place]) ? ?Hospital Problems: Principal Problem: ?  Intertrochanteric fracture of left hip ([place]) ?Active Problems: ?  ESRD on hemodialysis ([place]) ?  Acute on chronic anemia ?  Diabetic peripheral neuropathy ([place]) ? ? ? ?Expected Discharge Date: Expected Discharge Date: 11/08/21 ? ?Team Members Present: ?Physician leading conference: Dr. Leeroy Cha ?Social Worker Present: Erlene Quan, BSW ?Nurse Present: Dorien Chihuahua, RN ?PT Present: Estevan Ryder, PT ?OT Present: Other (comment) [facility] Alphonsa Gin, [place]) ?SLP Present: Weston Anna, SLP ?PPS Coordinator present : Gunnar Fusi, SLP ? ?   Current Status/Progress Goal Weekly Team Focus  ?Bowel/Bladder ? ? incontinent of bowels, oliguric  restore continence of bowel, continue dialysis on scheduled days  Toileting every 2 hours   ?Swallow/Nutrition/ Hydration ? ?           ?ADL's ? ? min A LB dressing self-care EOB, mod I UB self-care, toileting Max A, toilet transfers slideboard min A +2  CGA transfers, self-care supervision  functional sit > stands and transfers, endurance, d/c planning   ?Mobility ? ? bed mobility supervision, slideboard transfers CGA/mod A, stands from EXTREMLY elevated mat with [place] walker and mod/min A, unable to progress to bilateral plafform walker or RW  min A standing/gait, CGA transfers and bed mobility  functional mobility/transfers, bed mobility, dynamic standing balance/tolerance, D/C planning   ?Communication ? ?           ?Safety/Cognition/ Behavioral Observations ?           ?Pain ? ? Patient  receives scheduled tylenol and PRN robaxin  patient's pain to decrease  Assess pain Q shift and PRN   ?Skin ? ? Right leg with brace  monitor for skin breakdown  Assess skin Q shift and PRN   ? ? ?Discharge Planning:  ?discharging home with spouse who still works. Inteermittent A   ?Team Discussion: ?Patient with hyperglycemia and bowel incontinence; MD addressed along with adjusting back to home HD medications. Hip incision looks good. Patient has made progress with slide board transfers but is not yet ready to progress to a bilateral platform walker. Discussion of car transfers using a slide-board for HD treatments.  ?Patient on target to meet rehab goals: ?yes, currently needs min assist for lower body care and mod I assist for upper body care. Toileting with max assist and completes slide board transfers with CGA. Able to stand with an eva walker and mi - mod assist. Goals for discharge set for CGA overall. ? ?*See Care Plan and progress notes for long and short-term goals.  ? ?Revisions to Treatment Plan:  ?N/A ?  ?Teaching Needs: ?Safety, transfers, toileting, medications, skin care, brace care/wear, etc  ?Current Barriers to Discharge: ?Decreased caregiver support, Hemodialysis, and Weight bearing restrictions ? ?Possible Resolutions to Barriers: ?Family education with spouse ?FMLA scheduled per spouse ?HH follow up until weight bearing restrictions lifted; then OP follow up ?DME: Patient has purchased padded tube bench for toileting ?  ? ? Medical Summary ?Current Status: hemodialysis three times  per week, right quadriceps tendon rupture status post surgical repair, left hip incision healing well, right knee pain ? Barriers to Discharge: Medical stability;Weight bearing restrictions;Wound care;Hemodialysis ? Barriers to Discharge Comments: hemodialysis three times per week, right quadriceps tendon rupture status post surgical repair, type 2 diabetes mellitus with hyperglycemia ?Possible Resolutions to  [employer] Focus: conitnue HD three times per week, continue brace for right lower extremity, continue to monitor left kip incision and right knee incision, increase insulin ? ? ?Continued Need for Acute Rehabilitation Level of Care: The patient requires daily medical management by a physician with specialized training in physical medicine and rehabilitation for the following reasons: ?Direction of a multidisciplinary physical rehabilitation program to maximize functional independence : Yes ?Medical management of patient stability for increased activity during participation in an intensive rehabilitation regime.: Yes ?Analysis of laboratory values and/or radiology reports with any subsequent need for medication adjustment and/or medical intervention. : Yes ? ? ?I attest that I was present, lead the team conference, and concur with the assessment and plan of the team. ? ? ?Dorien Chihuahua B ?10/30/2021, 8:40 AM  ? ? ? ? ? ? ?

## 2021-10-30 NOTE — Progress Notes (Signed)
PROGRESS NOTE   Subjective/Complaints: She is frustrated about current discharge date- feels like we are kicking her out. Discussed team meeting yesterday and she does understand that current RLE brace is limiting her ability to advance with therapy.   ROS: Denies CP, SOB, +incontinent stool, +right knee pain  Objective:   No results found. Recent Labs    10/29/21 1325  WBC 6.6  HGB 9.1*  HCT 27.6*  PLT 261       Recent Labs    10/29/21 1326  NA 138  K 4.2  CL 99  CO2 25  GLUCOSE 184*  BUN 41*  CREATININE 6.07*  CALCIUM 9.3      Intake/Output Summary (Last 24 hours) at 10/30/2021 1340 Last data filed at 10/30/2021 1257 Gross per 24 hour  Intake 280 ml  Output 1500 ml  Net -1220 ml         Physical Exam: Vital Signs Blood pressure (!) 162/63, pulse 75, temperature 98.7 F (37.1 C), resp. rate 16, height 5\' 7"  (1.702 m), weight 77.8 kg, SpO2 100 %. Gen: no distress, normal appearing, BMI 26.35 HEENT: oral mucosa pink and moist, NCAT Cardio: Reg rate Chest: normal effort, normal rate of breathing Abd: soft, non-distended Ext: no edema Psych: pleasant, normal affect, anxiety, discouraged regarding slow progress Musculoskeletal:        General: Swelling and tenderness present.     Cervical back: Normal range of motion and neck supple.     Right lower leg: Edema present. In brace    Left lower leg: Edema present.     Comments: Left hip tender to palpation and PROM with associated swelling. Left knee with mild effusion, patella laxity. Right hip tender with leg raise  Right knee tender and swollen, ace bandage in place Skin:    Comments: Incision site dressing clean dry and intact.  R IJ TDC with bandage Neuro: Alert Motor: Bilateral upper extremities: 5/5 proximal distal Bilateral lower extremities:  Right lower extremity: Limited by bracing, hip flexion?  2/5   Assessment/Plan: 1. Functional  deficits which require 3+ hours per day of interdisciplinary therapy in a comprehensive inpatient rehab setting. Physiatrist is providing close team supervision and 24 hour management of active medical problems listed below. Physiatrist and rehab team continue to assess barriers to discharge/monitor patient progress toward functional and medical goals  Care Tool:  Bathing    Body parts bathed by patient: Face   Body parts bathed by helper: Left lower leg Body parts n/a: Right lower leg   Bathing assist Assist Level: Independent with assistive device     Upper Body Dressing/Undressing Upper body dressing   What is the patient wearing?: Dress    Upper body assist Assist Level: Set up assist    Lower Body Dressing/Undressing Lower body dressing      What is the patient wearing?: Pants     Lower body assist Assist for lower body dressing: Minimal Assistance - Patient > 75%     Toileting Toileting Toileting Activity did not occur (Clothing management and hygiene only): N/A (no void or bm)  Toileting assist Assist for toileting: Maximal Assistance - Patient 25 - 49%  Transfers Chair/bed transfer  Transfers assist     Chair/bed transfer assist level: Contact Guard/Touching assist Chair/bed transfer assistive device: Sliding board   Locomotion Ambulation   Ambulation assist   Ambulation activity did not occur: Safety/medical concerns          Walk 10 feet activity   Assist  Walk 10 feet activity did not occur: Safety/medical concerns        Walk 50 feet activity   Assist Walk 50 feet with 2 turns activity did not occur: Safety/medical concerns         Walk 150 feet activity   Assist Walk 150 feet activity did not occur: Safety/medical concerns         Walk 10 feet on uneven surface  activity   Assist Walk 10 feet on uneven surfaces activity did not occur: Safety/medical concerns         Wheelchair     Assist Is the  patient using a wheelchair?: Yes Type of Wheelchair: Manual    Wheelchair assist level: Supervision/Verbal cueing, Set up assist Max wheelchair distance: 164ft    Wheelchair 50 feet with 2 turns activity    Assist        Assist Level: Supervision/Verbal cueing   Wheelchair 150 feet activity     Assist      Assist Level: Supervision/Verbal cueing   Blood pressure (!) 162/63, pulse 75, temperature 98.7 F (37.1 C), resp. rate 16, height 5\' 7"  (1.702 m), weight 77.8 kg, SpO2 100 %.    Medical Problem List and Plan: 1. Functional deficits secondary to nondisplaced fracture right inferior pubic ramus as well as a displaced foreshortened fracture through the left femoral neck/intertrochanteric area.  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   -Weightbearing as tolerated with anterior total hip precautions.                                  -Conservative care right inferior pubic ramus fracture, weightbearing as tolerated             -patient may  shower             -ELOS/Goals: 3-4 weeks total, supervision to min assist goals  Continue CIR  HFU scheduled. 2.  Impaired mobility -DVT/anticoagulation:  Mechanical: Antiembolism stockings, thigh (TED hose) Bilateral lower extremities. Vascular ultrasound reviewed and negative for clot.  -should be able to begin lovenox today as permacath placed            -antiplatelet therapy: N/A 3. Femur fracture pain: conitnue Flector patch, Robaxin 750 mg 4 times daily, hydrocodone and oxycodone as needed             -pt having significant spasms in left thigh.             -add kpad to help with spasms  -schedule tylenol 1000mg  TID  Appears to be controlled on 2/25 4. Anxiety: Instructed on deep breathing techniques. Provide emotional support.  Melatonin as needed             -antipsychotic agents: N/A 5. Neuropsych: This patient is capable of making decisions on her own behalf. 6. Skin/Wound Care: Routine skin checks 7.  Fluids/Electrolytes/Nutrition: Routine in and outs with follow-up chemistries 8.  ESRD on HD.  Permacath exchanged 10/03/2021.    -  Follow-up hemodialysis per Dr. [name] -Messaged therapy to see if we can practice transfers from inclined chair as she  is nervous about this transfer during outpatient dialysis,  -HD later in day to allow participation in therapies during the day Appreciate nephro recs 9.  Acute on chronic anemia.  Has had transfusions already x 2. Hgb has stabilized in 7 range. continue iron supplement.              -epo per nephrology  Hemoglobin 8.7 on 2/24, appreciate nephro recs 10.  Diabetes mellitus with peripheral neuropathy. Placed order that she may use her Dexcom 6. Hemoglobin A1c 6.2.  SSI as prior to admission. Provide dietary education.  CBG (last 3)  Recent Labs    10/29/21 2132 10/30/21 0621 10/30/21 1159  GLUCAP 246* 159* 136*  Some lability will monitor. Increase Semglee to 17U  11.  Hypertension. Continue Cozaar,increased Vitals:   10/30/21 0434 10/30/21 1257  BP: (!) 143/62 (!) 162/63  Pulse: 78 75  Resp: 16 16  Temp: 98.4 F (36.9 C) 98.7 F (37.1 C)  SpO2: 100% 100%   Mildly elevated, avoid overtreatment due to dialysis 12.  Hyperparathyroidism of renal origin.  Plan outpatient parathyroidectomy per Dr.Lateef 13.  Overweight.  BMI 29.11-->26.35  Dietary follow-up. Provided list of foods that can help with weight loss. Discussed benefits of intermittent fasting.  14. Cushingoid?--outpt work up 42. Constipation: resolved. Decrease colace to daily prn. D/c senna. Discussed benefits of high fiber foods. Continue bowel regimen. D/c miralax.  16. Right quadriceps tear: surgery complete with good healing on XR, minimal postop pain, discussed importance of participating in 3 hours therapy daily as much as she can tolerate and she is very motivated and excited for rehab, remove dressing today 17. Gas: continue simethicone prn 18. Ileus: resolved, regular  diet resumed -tolerating.  19. Abd spasms: resolved 20. Black tarry stools: discussed heme occult negative last night. Hgb reviewed and stable.  21. Vitamin D deficiency: continue ergocalciferol 50,000U once per week for 7 weeks. Discussed that this dose is higher than a daily supplement.  22. Left heel pain: discussed possible plantar fasciitis. Asked OT to give her exercises/medicine ball to roll foot on. 23. Stool incontinence: no signs of cauda equina 24. Impaired mobility and ADLs: discussed that home therapy will be 2-3 times per week.      LOS: 13 days A FACE TO FACE EVALUATION WAS PERFORMED  Marquese Burkland P Ezekiah Massie 10/30/2021, 1:40 PM

## 2021-10-30 NOTE — Progress Notes (Signed)
?   [facility] ?Progress Note  ? ?Subjective:   Seen in room.  HD went well yesterday.  No issues today. ? ?Objective ?Vitals:  ? 10/29/21 1620 10/29/21 1640 10/29/21 2012 10/30/21 0434  ?BP: (!) 117/55 (!) 147/58 136/64 (!) 143/62  ?Pulse: 82 96 83 78  ?Resp: 20 18 14 16   ?Temp: (!) 97.4 ?F (36.3 ?C) 98.5 ?F (36.9 ?C) 99.2 ?F (37.3 ?C) 98.4 ?F (36.9 ?C)  ?TempSrc: Temporal Oral Oral Oral  ?SpO2:  100% 97% 100%  ?Weight:      ?Height:      ? ?Physical Exam ?General:chronically ill appearing female in NAD ?Heart:RRR, no mrg ?Lungs:CTAB, nml WOB ?Abdomen:soft, NTND ?Extremities:no LE edema, R leg in brace ?Dialysis Access: [facility]  ? ?Filed Weights  ? 10/27/21 1800 10/28/21 0526 10/29/21 1305  ?Weight: 76.1 kg 76.5 kg 77.8 kg  ? ? ?Intake/Output Summary (Last 24 hours) at 10/30/2021 1156 ?Last data filed at 10/30/2021 0900 ?Gross per 24 hour  ?Intake 280 ml  ?Output 1500 ml  ?Net -1220 ml  ? ? ?Additional Objective ?Labs: ?Basic Metabolic Panel: ?Recent Labs  ?Lab 10/24/21 ?1326 10/27/21 ?1248 10/29/21 ?1326  ?NA 135 136 138  ?K 4.5 5.0 4.2  ?CL 96* 96* 99  ?CO2 22 23 25   ?GLUCOSE 232* 202* 184*  ?BUN 52* 55* 41*  ?CREATININE 6.40* 7.31* 6.07*  ?CALCIUM 9.2 9.1 9.3  ?PHOS 5.1* 4.9* 4.6  ? ?Liver Function Tests: ?Recent Labs  ?Lab 10/24/21 ?1326 10/27/21 ?1248 10/29/21 ?1326  ?ALBUMIN 3.1* 3.3* 3.3*  ? ?CBC: ?Recent Labs  ?Lab 10/24/21 ?1326 10/27/21 ?1248 10/29/21 ?1325  ?WBC 6.4 6.8 6.6  ?HGB 8.7* 8.7* 9.1*  ?HCT 27.5* 27.6* 27.6*  ?MCV 100.4* 101.5* 100.7*  ?PLT 235 245 261  ? ?Medications: ? ? acetaminophen  650 mg Oral TID  ? Chlorhexidine Gluconate Cloth  6 each Topical Q0600  ? Chlorhexidine Gluconate Cloth  6 each Topical Q0600  ? darbepoetin (ARANESP) injection - DIALYSIS  60 mcg Intravenous Q Fri-HD  ? enoxaparin (LOVENOX) injection  30 mg Subcutaneous Q24H  ? insulin aspart  0-9 Units Subcutaneous TID WC  ? [START ON 10/31/2021] insulin glargine-yfgn  17 Units Subcutaneous Daily  ? losartan  100 mg Oral  Daily  ? Vitamin D (Ergocalciferol)  50,000 Units Oral Q7 days  ? ? ?Dialysis Orders: ?MWF [place] [phone]) ? 3h 63min  81kg   Hep 3000 then 800u/hr  [facility] TDC ?   - Hep B SAg neg 2/8, covid neg 1/29 ?  ?Assessment/Plan: ?Debility - rehab per CIR ?RLE tendon rupture - s/p surgery 2/16 ?L hip fracture/ R pelvic ramus fx - sp L hemiarthroplasty on 09/29/21 at [facility], then sent here for CIR ?ESRD - on HD MWF. next HD 10/31/21 ?BP/ volume - increase cozaar to 100 mg daily ?DM2 - per pmd ?Anemia ckd - Hgb trending down, last 8.7. S/p 1 unit prbc on 2/3.  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  Continue binders.  ?9. Nutrition - renal diet ? ?Madelon Lips MD ?[facility] ?10/30/2021,11:56 AM ? LOS: 13 days  ? ? ?

## 2021-10-31 ENCOUNTER — Inpatient Hospital Stay (HOSPITAL_COMMUNITY): Payer: Medicare Other

## 2021-10-31 LAB — HEPATIC FUNCTION PANEL
ALT: 9 U/L (ref 0–44)
AST: 16 U/L (ref 15–41)
Albumin: 3.3 g/dL — ABNORMAL LOW (ref 3.5–5.0)
Alkaline Phosphatase: 719 U/L — ABNORMAL HIGH (ref 38–126)
Bilirubin, Direct: 0.2 mg/dL (ref 0.0–0.2)
Indirect Bilirubin: 0.7 mg/dL (ref 0.3–0.9)
Total Bilirubin: 0.9 mg/dL (ref 0.3–1.2)
Total Protein: 6.2 g/dL — ABNORMAL LOW (ref 6.5–8.1)

## 2021-10-31 LAB — CREATININE, SERUM
Creatinine, Ser: 5.22 mg/dL — ABNORMAL HIGH (ref 0.44–1.00)
GFR, Estimated: 9 mL/min — ABNORMAL LOW (ref >60)

## 2021-10-31 LAB — GLUCOSE, CAPILLARY
Glucose-Capillary: 146 mg/dL — ABNORMAL HIGH (ref 70–99)
Glucose-Capillary: 115 mg/dL — ABNORMAL HIGH (ref 70–99)
Glucose-Capillary: 143 mg/dL — ABNORMAL HIGH (ref 70–99)
Glucose-Capillary: 255 mg/dL — ABNORMAL HIGH (ref 70–99)

## 2021-10-31 IMAGING — MR MR FOOT*L* W/O CM
4 of 6 series · 19 of 40 positions shown · non-contrast
Comparison: Left foot x-rays from same day.

CLINICAL DATA: Left foot pain.

EXAM:
MRI OF THE LEFT FOOT WITHOUT CONTRAST
TECHNIQUE: Multiplanar, multisequence MR imaging of the left forefoot was
performed. No intravenous contrast was administered.

[Series 5: T1 · axial · 3.0mm · 0.29mm/px · z∈[-7,+71]mm · 3 of 38 slices shown]
[im 6/38]
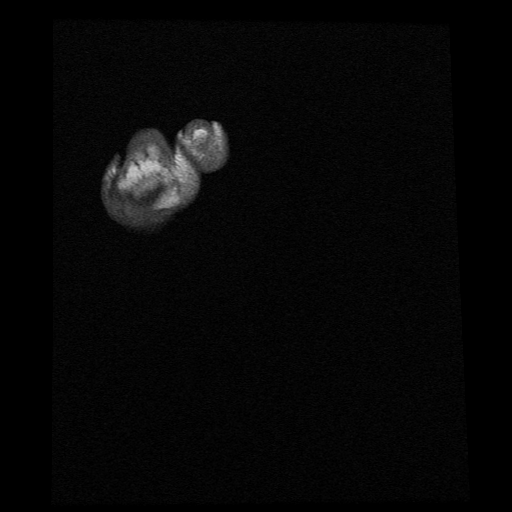
[im 22/38]
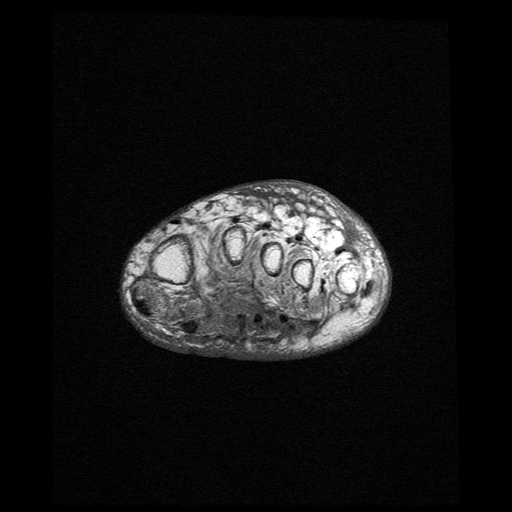
[im 32/38]
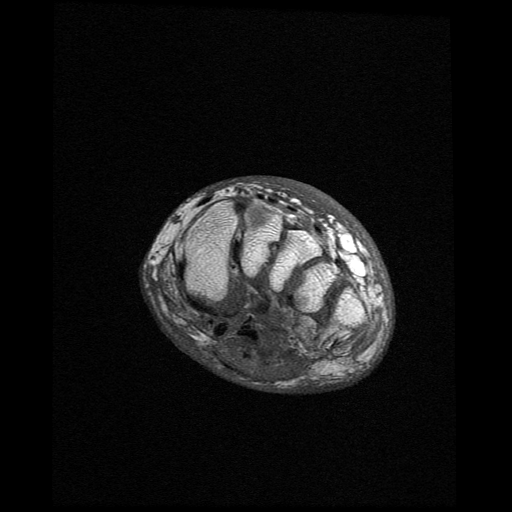

[Series 6: T1 fat-sat · axial · non-contrast · 3.0mm · 0.29mm/px · z∈[-7,+71]mm · 3 of 38 slices shown]
[im 6/38]
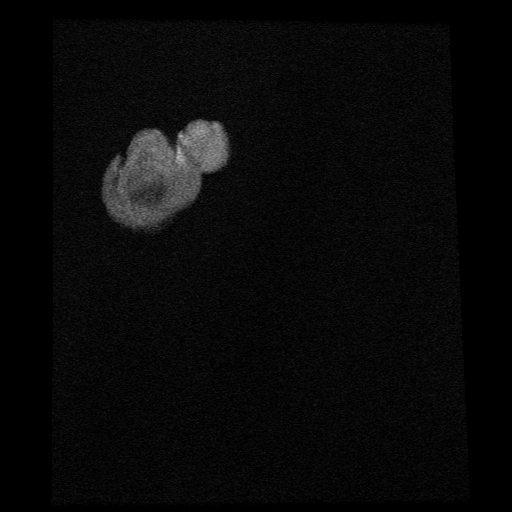
[im 22/38]
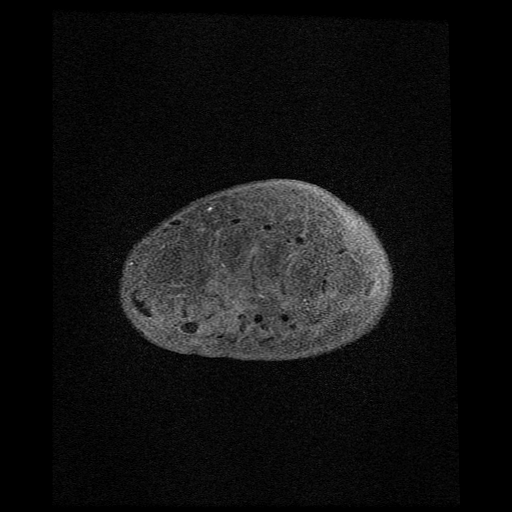
[im 32/38]
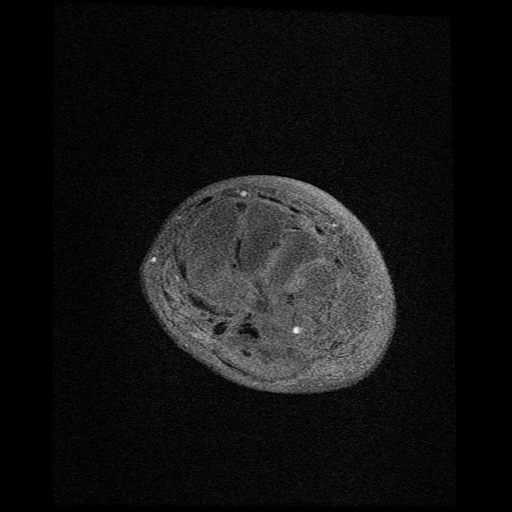

[Series 7: T2 fat-sat · axial · 3.0mm · 0.29mm/px · z∈[-22,+89]mm · 8 of 38 slices shown (1 of 2)]
[im 1/38]
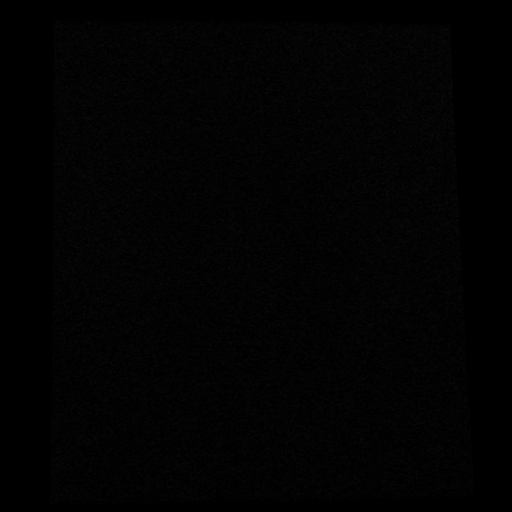
[im 6/38]
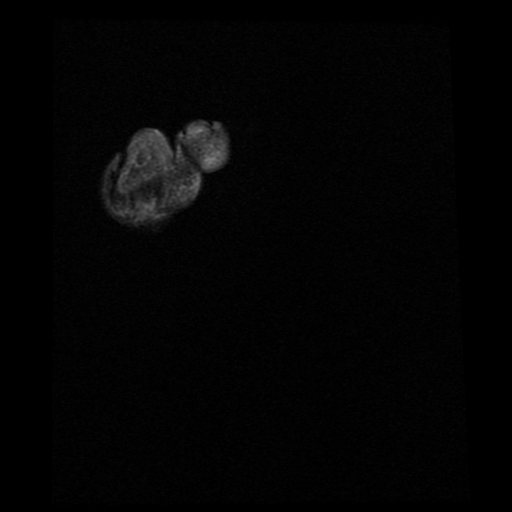
[im 11/38]
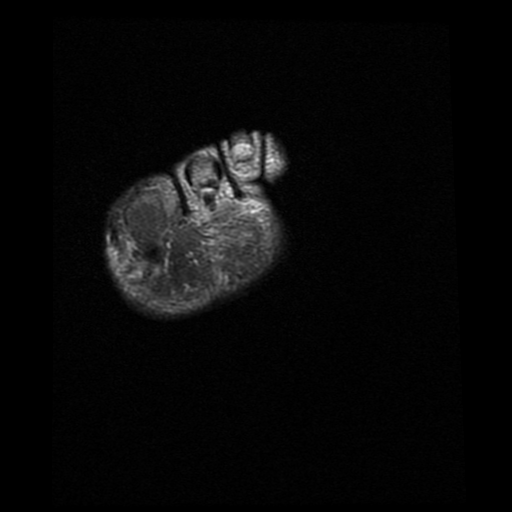
[im 16/38]
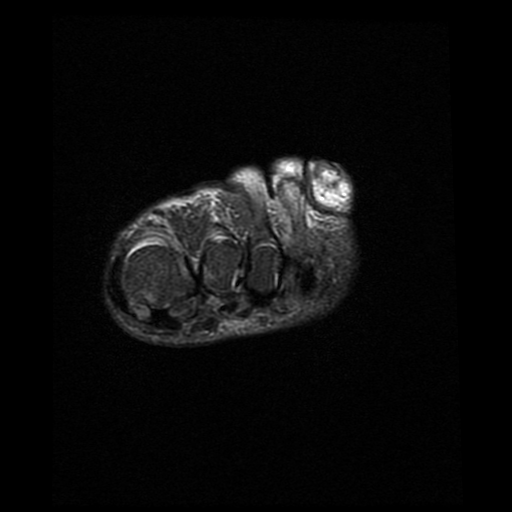
[im 22/38]
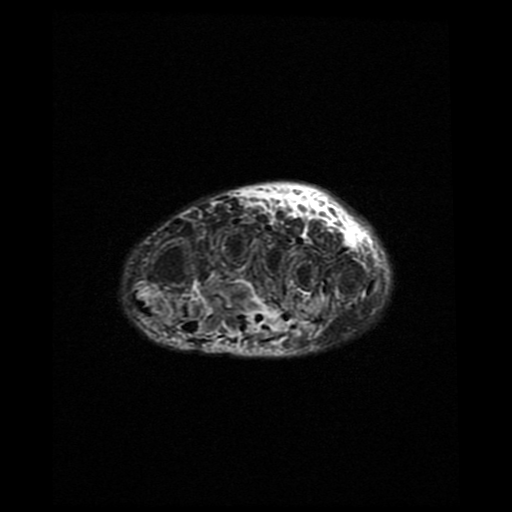
[im 27/38]
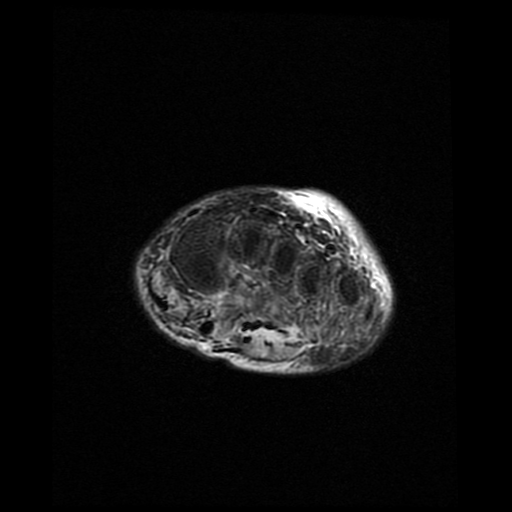
[im 32/38]
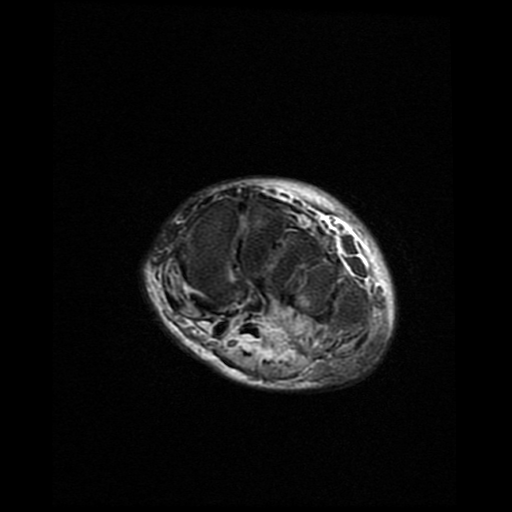
[im 38/38]
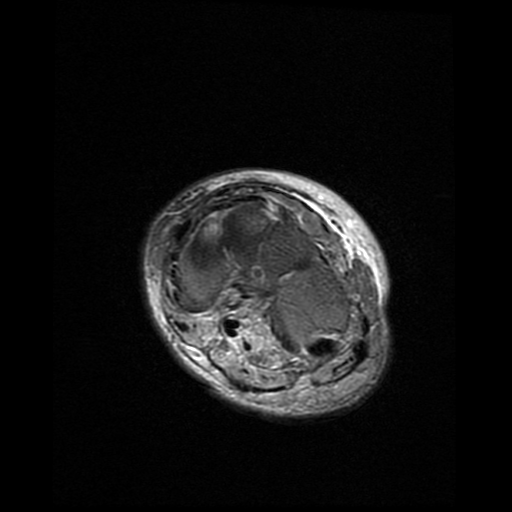

[Series 9: T2 fat-sat · oblique · 3.0mm · 0.29mm/px · 5 of 21 slices shown (2 of 2)]
[im 1/21]
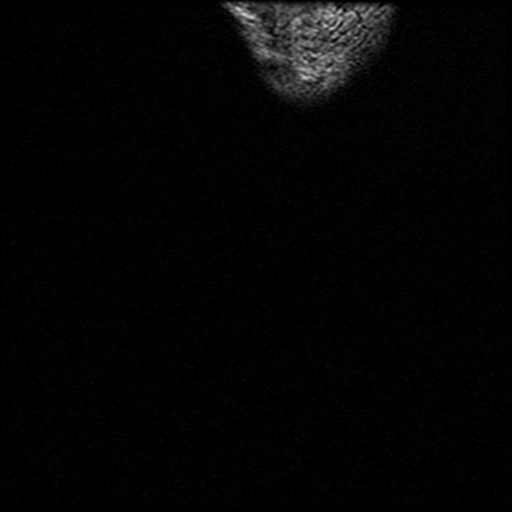
[im 6/21]
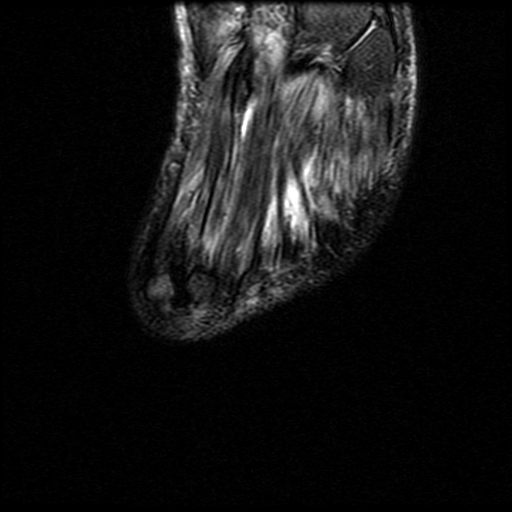
[im 11/21]
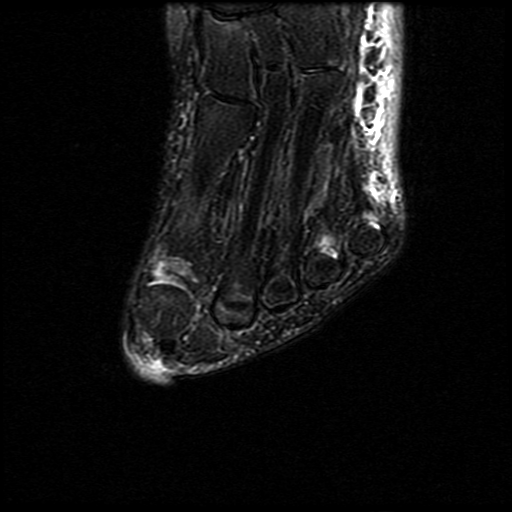
[im 16/21]
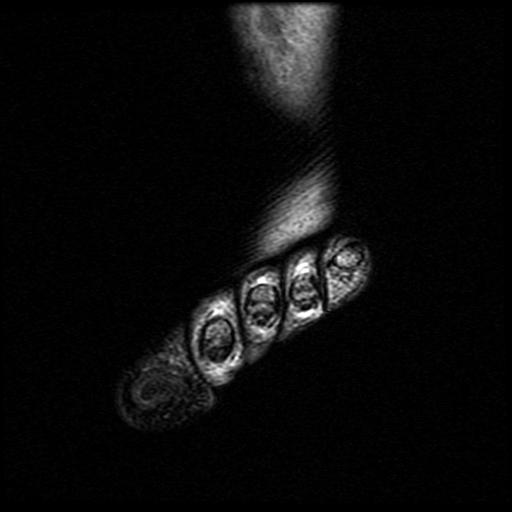
[im 21/21]
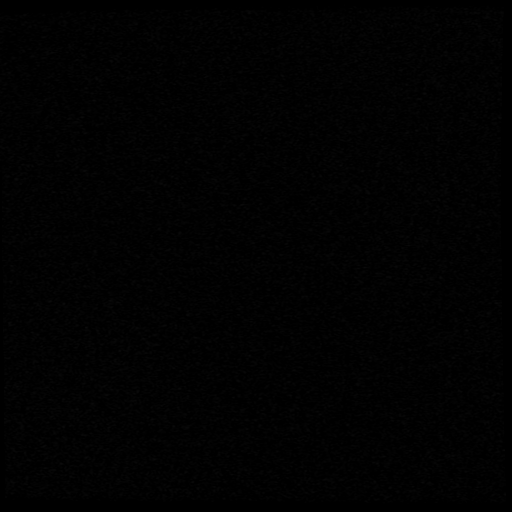

[19 of 40 positions shown; findings below may reference images not displayed]

FINDINGS: Bones/Joint/Cartilage

No suspicious marrow signal abnormality. No fracture or dislocation.
Mild second TMT joint osteoarthritis. No joint effusion.

Ligaments

Collateral ligaments are intact.

Muscles and Tendons
Flexor and extensor tendons are intact. Increased T2 signal within
and atrophy of the intrinsic muscles of the forefoot, nonspecific,
but likely related to diabetic muscle changes.

Soft tissue
Dorsal forefoot soft tissue swelling. No fluid collection or
hematoma. No soft tissue mass.
IMPRESSION: 1. No acute abnormality. Specifically, no fracture of the fourth
proximal phalanx.

## 2021-10-31 IMAGING — CR DG FOOT COMPLETE 3+V*L*
3 series · 3 of 3 positions shown · non-contrast
Comparison: None.

CLINICAL DATA: Beginning to walk after knee surgery and felt pain
and swelling in left foot.

EXAM:
LEFT FOOT - COMPLETE 3+ VIEW

[foot ap]
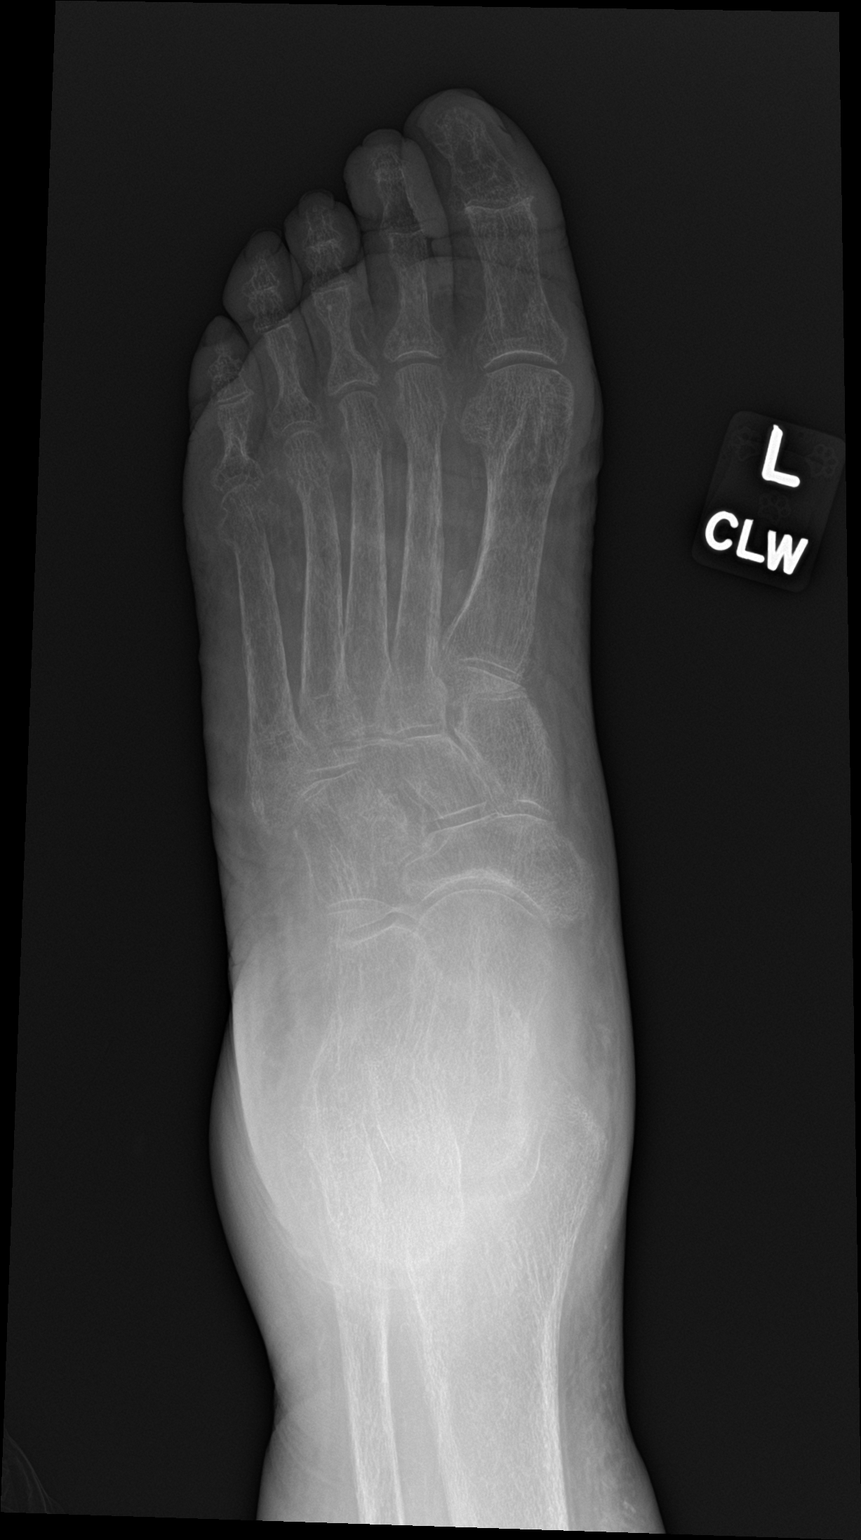

[foot obl]
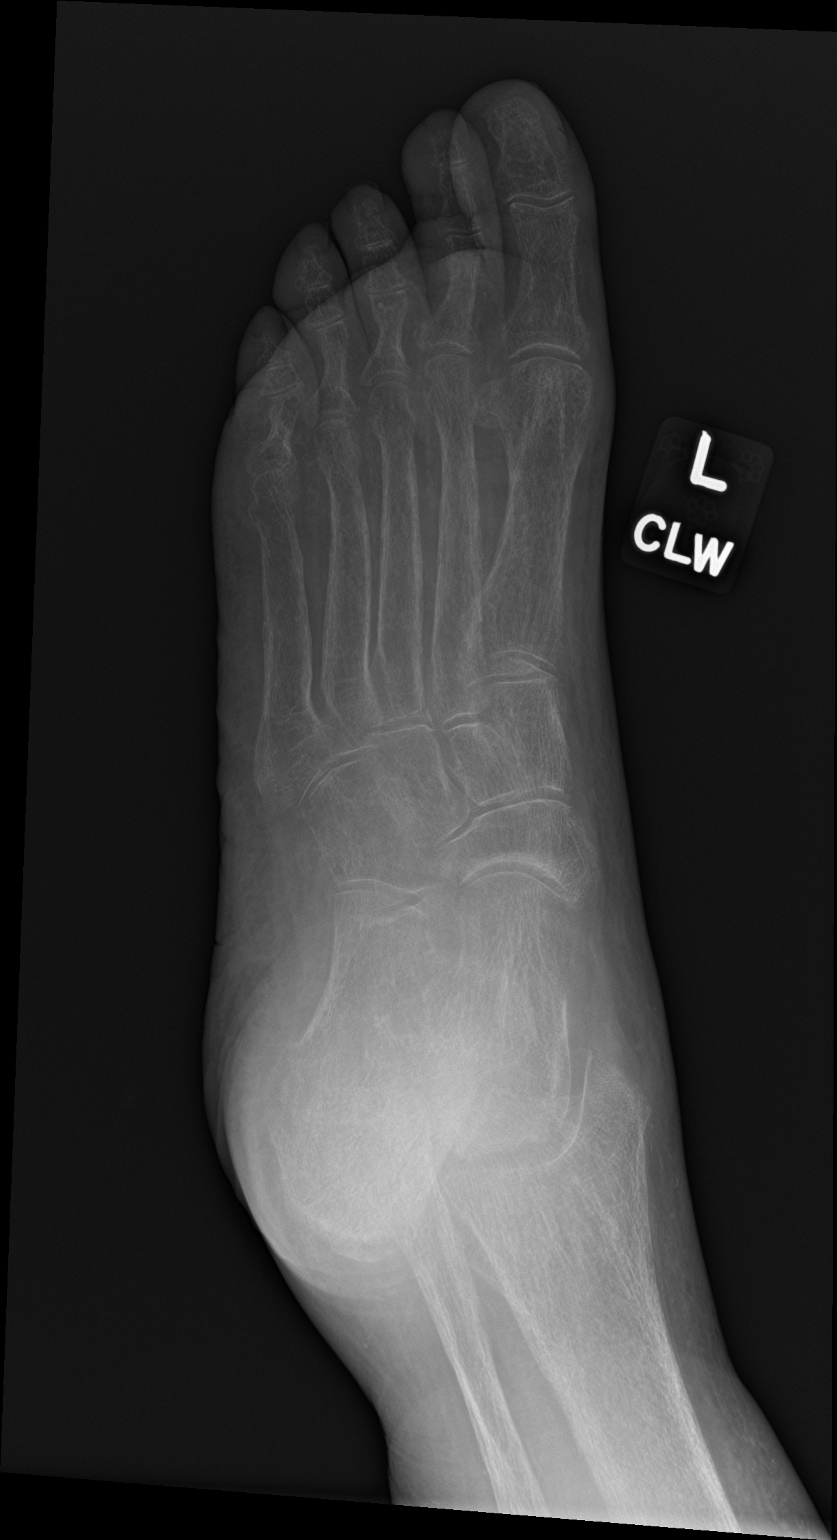

[foot lat]
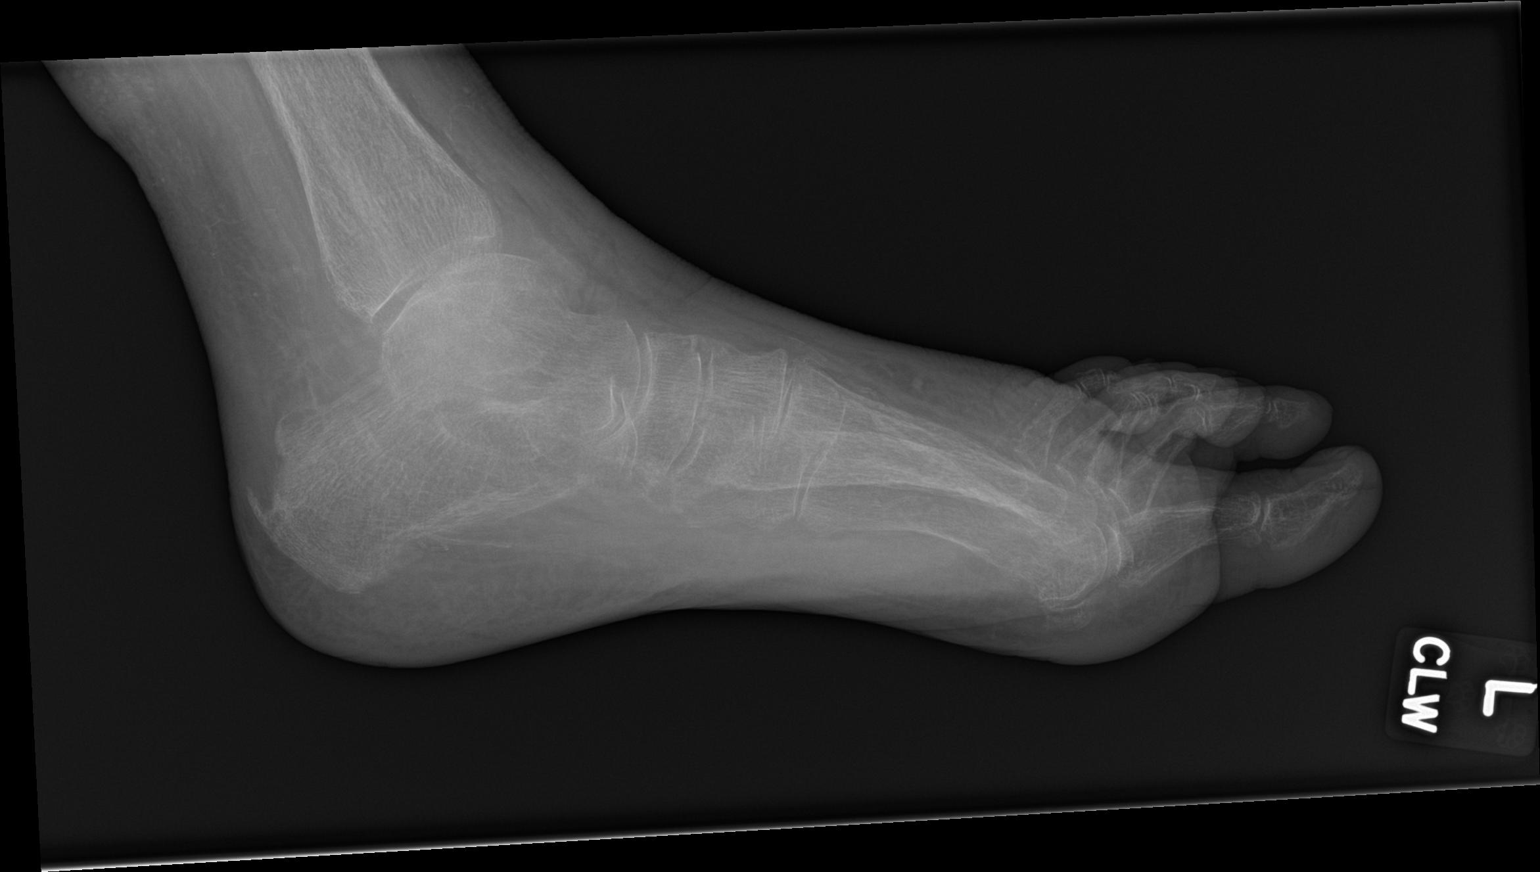

[3 of 3 positions shown; findings below may reference images not displayed]

FINDINGS: There is diffuse decreased bone mineralization. Mild joint space
narrowing of the interphalangeal joints diffusely. Mild dorsal
tarsometatarsal degenerative osteophytes seen on lateral view. Small
plantar calcaneal heel spur. Mild chronic spurring at the Achilles
insertion on the calcaneus. Mild posterior distal tibial plafond
degenerative osteophytosis. Vascular calcifications are noted.

Mild dorsal forefoot soft tissue swelling. Within the limitations of
decreased bone mineralization, no definite acute fracture is
visualized, however it is difficult to exclude a possible
nondisplaced fracture seen on oblique view only within the proximal
metaphysis of the proximal phalanx of fourth toe given overlapping
bones versus minimal cortical step-off medially. No dislocation.
IMPRESSION: Diffuse decreased bone mineralization limits evaluation for an acute
fracture. There is soft tissue swelling of the dorsal aspect of the
forefoot. No definitive fracture line is seen, however on oblique
view there is question of minimal lucency and cortical step-off that
may represent normal overlap of bones versus a tiny nondisplaced
fracture. Recommend clinical correlation for point tenderness.

## 2021-10-31 MED ORDER — LOSARTAN POTASSIUM 50 MG PO TABS
75.0000 mg | ORAL_TABLET | Freq: Every day | ORAL | Status: DC
Start: 2021-11-01 — End: 2021-11-08
  Administered 2021-11-01 – 2021-11-06 (×4): 75 mg via ORAL
  Filled 2021-10-31 (×8): qty 2

## 2021-10-31 MED ORDER — HEPARIN SODIUM (PORCINE) 1000 UNIT/ML IJ SOLN
INTRAMUSCULAR | Status: AC
  Filled 2021-10-31: qty 3

## 2021-10-31 MED ORDER — INSULIN GLARGINE-YFGN 100 UNIT/ML ~~LOC~~ SOLN
18.0000 [IU] | Freq: Every day | SUBCUTANEOUS | Status: DC
  Administered 2021-11-01 – 2021-11-03 (×3): 18 [IU] via SUBCUTANEOUS
  Filled 2021-10-31 (×3): qty 0.18

## 2021-10-31 NOTE — Progress Notes (Signed)
?[facility] ?Progress Note  ? ?Subjective:   Seen in room.  Had L foot xrays, pending ? ?Objective ?Vitals:  ? 10/30/21 0434 10/30/21 1257 10/30/21 2054 10/31/21 0629  ?BP: (!) 143/62 (!) 162/63 (!) 146/65 (!) 159/71  ?Pulse: 78 75 86 85  ?Resp: 16 16 17 20   ?Temp: 98.4 ?F (36.9 ?C) 98.7 ?F (37.1 ?C) 98.4 ?F (36.9 ?C) 97.9 ?F (36.6 ?C)  ?TempSrc: Oral  Oral Oral  ?SpO2: 100% 100% 100% 100%  ?Weight:    78 kg  ?Height:      ? ?Physical Exam ?General:chronically ill appearing female in NAD ?Heart:RRR, no mrg ?Lungs:CTAB, nml WOB ?Abdomen:soft, NTND ?Extremities:no LE edema, R leg in brace ?Dialysis Access: [facility]  ? ?Filed Weights  ? 10/28/21 0526 10/29/21 1305 10/31/21 0629  ?Weight: 76.5 kg 77.8 kg 78 kg  ? ? ?Intake/Output Summary (Last 24 hours) at 10/31/2021 1201 ?Last data filed at 10/31/2021 0700 ?Gross per 24 hour  ?Intake 237 ml  ?Output --  ?Net 237 ml  ? ? ?Additional Objective ?Labs: ?Basic Metabolic Panel: ?Recent Labs  ?Lab 10/24/21 ?1326 10/27/21 ?1248 10/29/21 ?1326 10/31/21 ?4650  ?NA 135 136 138  --   ?K 4.5 5.0 4.2  --   ?CL 96* 96* 99  --   ?CO2 22 23 25   --   ?GLUCOSE 232* 202* 184*  --   ?BUN 52* 55* 41*  --   ?CREATININE 6.40* 7.31* 6.07* 5.22*  ?CALCIUM 9.2 9.1 9.3  --   ?PHOS 5.1* 4.9* 4.6  --   ? ?Liver Function Tests: ?Recent Labs  ?Lab 10/27/21 ?1248 10/29/21 ?1326 10/31/21 ?3546  ?AST  --   --  16  ?ALT  --   --  9  ?ALKPHOS  --   --  719*  ?BILITOT  --   --  0.9  ?PROT  --   --  6.2*  ?ALBUMIN 3.3* 3.3* 3.3*  ? ?CBC: ?Recent Labs  ?Lab 10/24/21 ?1326 10/27/21 ?1248 10/29/21 ?1325  ?WBC 6.4 6.8 6.6  ?HGB 8.7* 8.7* 9.1*  ?HCT 27.5* 27.6* 27.6*  ?MCV 100.4* 101.5* 100.7*  ?PLT 235 245 261  ? ?Medications: ? ? acetaminophen  650 mg Oral TID  ? Chlorhexidine Gluconate Cloth  6 each Topical Q0600  ? Chlorhexidine Gluconate Cloth  6 each Topical Q0600  ? darbepoetin (ARANESP) injection - DIALYSIS  60 mcg Intravenous Q Fri-HD  ? enoxaparin (LOVENOX) injection  30 mg Subcutaneous Q24H  ?  insulin aspart  0-9 Units Subcutaneous TID WC  ? [START ON 11/01/2021] insulin glargine-yfgn  18 Units Subcutaneous Daily  ? losartan  100 mg Oral Daily  ? Vitamin D (Ergocalciferol)  50,000 Units Oral Q7 days  ? ? ?Dialysis Orders: ?MWF [place] [id number]) ? 3h 46min  81kg   Hep 3000 then 800u/hr  [facility] TDC ?   - Hep B SAg neg 2/8, covid neg 1/29 ?  ?Assessment/Plan: ?Debility - rehab per CIR ?RLE tendon rupture - s/p surgery 2/16 ?L hip fracture/ R pelvic ramus fx - sp L hemiarthroplasty on 09/29/21 at [facility], then sent here for CIR ?ESRD - on HD MWF. next HD 10/31/21 ?BP/ volume - increased cozaar to 100 mg daily ?DM2 - per pmd ?Anemia ckd - Hgb trending down, last 8.7. S/p 1 unit prbc on 2/3.  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  Continue binders.  ?9. Nutrition -  renal diet ?10.  Dispo: 3/11 discharge ? ?Madelon Lips MD ?[facility] ?10/31/2021,12:01 PM ? LOS: 14 days  ? ? ?

## 2021-10-31 NOTE — Plan of Care (Signed)
?  Problem: RH Balance ?Goal: LTG: Patient will maintain dynamic sitting balance (OT) ?Description: LTG:  Patient will maintain dynamic sitting balance with assistance during activities of daily living (OT) ?Flowsheets (Taken 10/31/2021 1253) ?LTG: Pt will maintain dynamic sitting balance during ADLs with: (upgraded) Independent with assistive device ?Note: Upgraded due to pt planning to d/c at a w/c level, increasing pt's independence with functional tasks  ?  ?Problem: RH Bathing ?Goal: LTG Patient will bathe all body parts with assist levels (OT) ?Description: LTG: Patient will bathe all body parts with assist levels (OT) ?Flowsheets (Taken 10/31/2021 1253) ?LTG: Pt will perform bathing with assistance level/cueing: (upgraded) Independent with assistive device  ?Note: Upgraded due to pt planning to d/c at a w/c level, increasing pt's independence with functional tasks  ?  ?Problem: RH Dressing ?Goal: LTG Patient will perform lower body dressing w/assist (OT) ?Description: LTG: Patient will perform lower body dressing with assist, with/without cues in positioning using equipment (OT) ?Flowsheets (Taken 10/31/2021 1253) ?LTG: Pt will perform lower body dressing with assistance level of: Independent with assistive device ?Note: Upgraded due to pt planning to d/c at a w/c level, increasing pt's independence with functional tasks  ?  ?Problem: RH Toilet Transfers ?Goal: LTG Patient will perform toilet transfers w/assist (OT) ?Description: LTG: Patient will perform toilet transfers with assist, with/without cues using equipment (OT) ?Flowsheets (Taken 10/31/2021 1253) ?LTG: Pt will perform toilet transfers with assistance level of: (upgraded) Independent with assistive device ?Note: Upgraded due to pt planning to d/c at a w/c level, increasing pt's independence with functional tasks  ?  ?

## 2021-10-31 NOTE — Plan of Care (Signed)
?  Problem: RH Ambulation ?Goal: LTG Patient will ambulate in controlled environment (PT) ?Description: LTG: Patient will ambulate in a controlled environment, # of feet with assistance (PT). ?Outcome: Not Applicable ?Flowsheets (Taken 10/31/2021 0758) ?LTG: Pt will ambulate in controlled environ  assist needed:: (D/C) -- ?Note: D/C ?Goal: LTG Patient will ambulate in home environment (PT) ?Description: LTG: Patient will ambulate in home environment, # of feet with assistance (PT). ?Outcome: Not Applicable ?Flowsheets (Taken 10/31/2021 0758) ?LTG: Pt will ambulate in home environ  assist needed:: (D/C) -- ?Note: D/C ?  ?Problem: RH Bed Mobility ?Goal: LTG Patient will perform bed mobility with assist (PT) ?Description: LTG: Patient will perform bed mobility with assistance, with/without cues (PT). ?Flowsheets (Taken 10/31/2021 0758) ?LTG: Pt will perform bed mobility with assistance level of: (upgraded due to improved strength and core control) Independent with assistive device  ?Note: upgraded due to improved strength and core control ?  ?Problem: RH Bed to Chair Transfers ?Goal: LTG Patient will perform bed/chair transfers w/assist (PT) ?Description: LTG: Patient will perform bed to chair transfers with assistance (PT). ?Flowsheets (Taken 10/31/2021 0758) ?LTG: Pt will perform Bed to Chair Transfers with assistance level: (upgraded due to improved strength and core control) Independent with assistive device  ?Note: upgraded due to improved strength and core control ?  ?Problem: RH Wheelchair Mobility ?Goal: LTG Patient will propel w/c in controlled environment (PT) ?Description: LTG: Patient will propel wheelchair in controlled environment, # of feet with assist (PT) ?Flowsheets (Taken 10/31/2021 0758) ?LTG: Pt will propel w/c in controlled environ  assist needed:: (upgraded due to improved strength and core control) Independent with assistive device ?LTG: Propel w/c distance in controlled environment: 184ft ?Note: upgraded  due to improved strength and core control ?Goal: LTG Patient will propel w/c in home environment (PT) ?Description: LTG: Patient will propel wheelchair in home environment, # of feet with assistance (PT). ?Flowsheets (Taken 10/31/2021 0758) ?LTG: Pt will propel w/c in home environ  assist needed:: (upgraded due to improved strength and core control) Supervision/Verbal cueing ?Note: upgraded due to improved strength and core control ?  ?

## 2021-10-31 NOTE — Progress Notes (Signed)
Occupational Therapy Session Note ? ?Patient Details  ?Name: [name] ?MRN: [id number] ?Date of Birth: 22-Jan-1966 ? ?Today's Date: 10/31/2021 ?OT Individual Time: 8115-7262 ?OT Individual Time Calculation (min): 68 min  ? ? ?Short Term Goals: ?Week 2:  OT Short Term Goal 1 (Week 2): Pt will complete BSC transfer with 1 assist using LRAD to promote OOB toileting ?OT Short Term Goal 2 (Week 2): Pt will complete 2/3 components of toileting with supervision ? ?Skilled Therapeutic Interventions/Progress Updates:  ?Skilled OT intervention completed with focus on self-care, functional transfers, manual fascia massage. Pt received seated upright in bed, agreeable to session. Pt reported that she has been using wash cloth to wash hair with therapist providing education about adaptive hair washing tray. Pt completed bed mobility with supervision, then slideboard transfer from EOB to w/c with mod A for board placement and supervision for sliding this session. Pt does require increased time for sliding while managing BLE with her hands intermittently, but has improved her safety with this method. While in w/c, therapist demonstrated to pt how wash tray can be applied, with therapist only rinsing pts hair with total A, and pt participating in hair washing/conditioning with mod I. Pt also completed facial hygiene/makeup, hair drying and curling with mod I while seated at sink.  ? ?Pt reported un-rated discomfort in her L foot, requesting to have it x-rayed with this therapist messaging MD and RN to relay the concern. Pt stating "I don't know if it happened in the fall and my hip has been my primary concern or if this is new." Therapist re-educated pt on the MD's previous suggestion to pt about it potentially being plantar fascitis due to pain location and MD recommending fascia massage to improve symptoms. Therapist provided pt a small tennis-like ball and dumbbell weight for pt to try self-rolling her foot to improve discomfort  with education provided about gliding direction. Pt was left seated in w/c, with chair alarm on and all needs in reach at end of session. ? ? ?Therapy Documentation ?Precautions:  ?Precautions ?Precautions: Fall, Anterior Hip, Other (comment) ?Precaution Comments: HD access port RUQ, L LE anterior hip precautions, R LE rehab protocol in pt's paper chart ?Required Braces or Orthoses: Knee Immobilizer - Right ?Knee Immobilizer - Right: On at all times, Other (comment) ?Restrictions ?Weight Bearing Restrictions: Yes ?RLE Weight Bearing: Touchdown weight bearing ?LLE Weight Bearing: Weight bearing as tolerated ?Other Position/Activity Restrictions: L LE anterior hip precautions - no hip extension, hip external rotation, or abduction ? ? ?Therapy/Group: Individual Therapy ? ?Johany Hansman E Valina Maes ?10/31/2021, 7:28 AM ?

## 2021-10-31 NOTE — Progress Notes (Signed)
PROGRESS NOTE   Subjective/Complaints: Complaining of left foot pain- will get XR to r/o fracture. She is now having pain with weightbearing. ADDENDUM: XR concerning for fracture, MRI ordered  ROS: Denies CP, SOB, +incontinent stool, +right knee pain, +left foot pain  Objective:   DG Foot Complete Left  Result Date: 10/31/2021 CLINICAL DATA:  Beginning to walk after knee surgery and felt pain and swelling in left foot. EXAM: LEFT FOOT - COMPLETE 3+ VIEW COMPARISON:  None. FINDINGS: There is diffuse decreased bone mineralization. Mild joint space narrowing of the interphalangeal joints diffusely. Mild dorsal tarsometatarsal degenerative osteophytes seen on lateral view. Small plantar calcaneal heel spur. Mild chronic spurring at the Achilles insertion on the calcaneus. Mild posterior distal tibial plafond degenerative osteophytosis. Vascular calcifications are noted. Mild dorsal forefoot soft tissue swelling. Within the limitations of decreased bone mineralization, no definite acute fracture is visualized, however it is difficult to exclude a possible nondisplaced fracture seen on oblique view only within the proximal metaphysis of the proximal phalanx of fourth toe given overlapping bones versus minimal cortical step-off medially. No dislocation. IMPRESSION: Diffuse decreased bone mineralization limits evaluation for an acute fracture. There is soft tissue swelling of the dorsal aspect of the forefoot. No definitive fracture line is seen, however on oblique view there is question of minimal lucency and cortical step-off that may represent normal overlap of bones versus a tiny nondisplaced fracture. Recommend clinical correlation for point tenderness. Electronically Signed   By: Yvonne Kendall M.D.   On: 10/31/2021 10:35   Recent Labs    10/29/21 1325  WBC 6.6  HGB 9.1*  HCT 27.6*  PLT 261       Recent Labs    10/29/21 1326  10/31/21 0550  NA 138  --   K 4.2  --   CL 99  --   CO2 25  --   GLUCOSE 184*  --   BUN 41*  --   CREATININE 6.07* 5.22*  CALCIUM 9.3  --       Intake/Output Summary (Last 24 hours) at 10/31/2021 1200 Last data filed at 10/31/2021 0700 Gross per 24 hour  Intake 237 ml  Output --  Net 237 ml         Physical Exam: Vital Signs Blood pressure (!) 159/71, pulse 85, temperature 97.9 F (36.6 C), temperature source Oral, resp. rate 20, height 5\' 7"  (1.702 m), weight 78 kg, SpO2 100 %. Gen: no distress, normal appearing, BMI 26.35 HEENT: oral mucosa pink and moist, NCAT Cardio: Reg rate Chest: normal effort, normal rate of breathing Abd: soft, non-distended Ext: no edema Psych: pleasant, normal affect, anxiety, discouraged regarding slow progress Musculoskeletal:        General: Swelling and tenderness present.     Cervical back: Normal range of motion and neck supple.     Right lower leg: Edema present. In brace    Left lower leg: Edema present.     Comments: Left hip tender to palpation and PROM with associated swelling. Left knee with mild effusion, patella laxity. Right hip tender with leg raise  Right knee tender and swollen, ace bandage in place Left foot tender to  palpation over medial heel and metatarsals Skin:    Comments: Incision site dressing clean dry and intact.  R IJ TDC with bandage Neuro: Alert Motor: Bilateral upper extremities: 5/5 proximal distal Bilateral lower extremities:  Right lower extremity: Limited by bracing, hip flexion?  2/5   Assessment/Plan: 1. Functional deficits which require 3+ hours per day of interdisciplinary therapy in a comprehensive inpatient rehab setting. Physiatrist is providing close team supervision and 24 hour management of active medical problems listed below. Physiatrist and rehab team continue to assess barriers to discharge/monitor patient progress toward functional and medical goals  Care Tool:  Bathing    Body  parts bathed by patient: Face   Body parts bathed by helper: Left lower leg Body parts n/a: Right lower leg   Bathing assist Assist Level: Independent with assistive device     Upper Body Dressing/Undressing Upper body dressing   What is the patient wearing?: Dress    Upper body assist Assist Level: Set up assist    Lower Body Dressing/Undressing Lower body dressing      What is the patient wearing?: Pants     Lower body assist Assist for lower body dressing: Minimal Assistance - Patient > 75%     Toileting Toileting Toileting Activity did not occur (Clothing management and hygiene only): N/A (no void or bm)  Toileting assist Assist for toileting: Maximal Assistance - Patient 25 - 49%     Transfers Chair/bed transfer  Transfers assist     Chair/bed transfer assist level: Contact Guard/Touching assist Chair/bed transfer assistive device: Sliding board   Locomotion Ambulation   Ambulation assist   Ambulation activity did not occur: Safety/medical concerns          Walk 10 feet activity   Assist  Walk 10 feet activity did not occur: Safety/medical concerns        Walk 50 feet activity   Assist Walk 50 feet with 2 turns activity did not occur: Safety/medical concerns         Walk 150 feet activity   Assist Walk 150 feet activity did not occur: Safety/medical concerns         Walk 10 feet on uneven surface  activity   Assist Walk 10 feet on uneven surfaces activity did not occur: Safety/medical concerns         Wheelchair     Assist Is the patient using a wheelchair?: Yes Type of Wheelchair: Manual    Wheelchair assist level: Supervision/Verbal cueing, Set up assist Max wheelchair distance: 14ft    Wheelchair 50 feet with 2 turns activity    Assist        Assist Level: Supervision/Verbal cueing   Wheelchair 150 feet activity     Assist      Assist Level: Supervision/Verbal cueing   Blood pressure  (!) 159/71, pulse 85, temperature 97.9 F (36.6 C), temperature source Oral, resp. rate 20, height 5\' 7"  (1.702 m), weight 78 kg, SpO2 100 %.    Medical Problem List and Plan: 1. Functional deficits secondary to nondisplaced fracture right inferior pubic ramus as well as a displaced foreshortened fracture through the left femoral neck/intertrochanteric area.  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   -Weightbearing as tolerated with anterior total hip precautions.                                  -Conservative care right inferior pubic ramus fracture, weightbearing  as tolerated             -patient may  shower             -ELOS/Goals: 22 days total, supervision to min assist goals  Continue CIR  HFU scheduled. 2.  Impaired mobility -DVT/anticoagulation:  Mechanical: Antiembolism stockings, thigh (TED hose) Bilateral lower extremities. Vascular ultrasound reviewed and negative for clot.  -should be able to begin lovenox today as permacath placed            -antiplatelet therapy: N/A 3. Femur fracture pain: conitnue Flector patch, Robaxin 750 mg 4 times daily, hydrocodone and oxycodone as needed             -pt having significant spasms in left thigh.             -add kpad to help with spasms  -schedule tylenol 1000mg  TID  Appears to be controlled on 2/25 4. Anxiety: Instructed on deep breathing techniques. Provide emotional support.  Melatonin as needed             -antipsychotic agents: N/A 5. Neuropsych: This patient is capable of making decisions on her own behalf. 6. Skin/Wound Care: Routine skin checks 7. Fluids/Electrolytes/Nutrition: Routine in and outs with follow-up chemistries 8.  ESRD on HD.  Permacath exchanged 10/03/2021.    -  Follow-up hemodialysis per Dr. [name] -Messaged therapy to see if we can practice transfers from inclined chair as she is nervous about this transfer during outpatient dialysis,  -HD later in day to allow participation in therapies during  the day Appreciate nephro recs 9.  Acute on chronic anemia.  Has had transfusions already x 2. Hgb has stabilized in 7 range. continue iron supplement.              -epo per nephrology  Hemoglobin 8.7 on 2/24, appreciate nephro recs 10.  Diabetes mellitus with peripheral neuropathy. Placed order that she may use her Dexcom 6. Hemoglobin A1c 6.2.  SSI as prior to admission. Provide dietary education.  CBG (last 3)  Recent Labs    10/30/21 1716 10/30/21 2059 10/31/21 0603  GLUCAP 250* 144* 146*  Some lability will monitor. Increase Semglee to 18U, discussed plan to transition back to her novolog at home  11.  Hypertension. Decrease Cozaar to 75mg  to allow more fluid to be removed with HD Vitals:   10/30/21 2054 10/31/21 0629  BP: (!) 146/65 (!) 159/71  Pulse: 86 85  Resp: 17 20  Temp: 98.4 F (36.9 C) 97.9 F (36.6 C)  SpO2: 100% 100%   Mildly elevated, avoid overtreatment due to dialysis 12.  Hyperparathyroidism of renal origin.  Plan outpatient parathyroidectomy per Dr.Lateef 13.  Overweight.  BMI 29.11-->26.35  Dietary follow-up. Provided list of foods that can help with weight loss. Discussed benefits of intermittent fasting.  14. Cushingoid?--outpt work up 49. Constipation: resolved. Decrease colace to daily prn. D/c senna. Discussed benefits of high fiber foods. Continue bowel regimen. D/c miralax.  16. Right quadriceps tear: surgery complete with good healing on XR, minimal postop pain, discussed importance of participating in 3 hours therapy daily as much as she can tolerate and she is very motivated and excited for rehab, remove dressing today 17. Gas: continue simethicone prn 18. Ileus: resolved, regular diet resumed -tolerating.  19. Abd spasms: resolved 20. Black tarry stools: discussed heme occult negative last night. Hgb reviewed and stable.  21. Vitamin D deficiency: continue ergocalciferol 50,000U once per week for 7 weeks. Discussed that  this dose is higher than a  daily supplement.  22. Left heel pain: discussed possible plantar fasciitis. Asked OT to give her exercises/medicine ball to roll foot on. 23. Stool incontinence: no signs of cauda equina 24. Impaired mobility and ADLs: discussed that home therapy will be 2-3 times per week.  25. Left foot pain over metatarsals- XR suggestive of fracture- MRI ordered     LOS: 14 days A FACE TO FACE EVALUATION WAS PERFORMED  Salbador Fiveash P Taijah Macrae 10/31/2021, 12:00 PM

## 2021-10-31 NOTE — Progress Notes (Signed)
Physical Therapy Weekly Progress Note ? ?Patient Details  ?Name: [name] ?MRN: [id number] ?Date of Birth: 1966-08-10 ? ?Beginning of progress report period: October 18, 2021 ?End of progress report period: October 31, 2021 ? ?Today's Date: 10/31/2021 ?PT Individual Time: 1030-1055 ?PT Individual Time Calculation (min): 25 min  ?Today's Date: 10/31/2021 ?PT Missed Time: 35 Minutes ?Missed Time Reason: Xray ? ?Patient has met 2 of 3 short term goals. Pt demonstrates slow and limited progress towards long term goals. Pt is currently able to perform bed mobility with supervision using UEs to assist LEs, slideboard transfers with CGA, and WC mobility 126f using BUE and supervision. Pt is able to stand from extremely elevated mat (23in) with EHarmon Pierwalker with min A/CGA, however had made minimal functional improvements with standing - therefore have recommended that pt discharge at a WC level until WB restrictions have been lifted. Pt continues to be limited by pain, weakness, difficulty adhering to RLE TDWB precautions. Plan for family education with husband on 3/7 to practice actual car transfer.  ? ?Patient continues to demonstrate the following deficits muscle weakness and muscle joint tightness, decreased cardiorespiratoy endurance, and decreased standing balance, decreased postural control, decreased balance strategies, and difficulty maintaining precautions and therefore will continue to benefit from skilled PT intervention to increase functional independence with mobility. ? ?Patient progressing toward long term goals..  Continue plan of care. ? ?PT Short Term Goals ?Week 2:  PT Short Term Goal 1 (Week 2): pt will transfer sit<>supine with LRAD and min A consistantly ?PT Short Term Goal 1 - Progress (Week 2): Met ?PT Short Term Goal 2 (Week 2): pt will perform transfers with LRAD and min A consistantly ?PT Short Term Goal 2 - Progress (Week 2): Met ?PT Short Term Goal 3 (Week 2): pt will initiate gait training ?PT  Short Term Goal 3 - Progress (Week 2): Not met ?Week 3:  PT Short Term Goal 1 (Week 3): STG=LTG due to LOS ? ?Skilled Therapeutic Interventions/Progress Updates:  ?Ambulation/gait training;Discharge planning;Functional mobility training;Psychosocial support;Therapeutic Activities;Balance/vestibular training;Disease management/prevention;Neuromuscular re-education;Skin care/wound management;Therapeutic Exercise;Wheelchair propulsion/positioning;Cognitive remediation/compensation;DME/adaptive equipment instruction;Pain management;Splinting/orthotics;UE/LE Strength taining/ROM;Community reintegration;Patient/family education;Stair training;UE/LE Coordination activities  ? ?Today's Interventions: ?Received pt being transported off unit for x-ray of L foot. Per x-ray technician, pt will return shortly. Updated pt that this therapist attempted to reach out to Dr. LDierdre Highmanoffice twice yesterday to confirm rehab protocol, however no answer. Encouraged pt to call Dr. LDierdre Highmanoffice, as she may have better luck, and provided pt with office phone number. Returned to pt's room, pt back from x-ray, and agreeable to PT treatment and denied any pain at rest. Attempted to find x-ray results per pt request, but not yet updated in chart. Discussed equipment for D/C with recommendation for 20x16 WC, R elevating legrest, and slideboard and updated [name]. Due to time restrictions, unable to practice car transfer today - plan for Monday prior to family education on Tuesday. Pt performed the following exercises with emphasis on LE strength/ROM:  ?-LLE AAROM heel slides 1x10 ?-LLE SAQ 2x10 ?-LLE ankle circles x20 clockwise/counterclockwise ?-LLE AAROM hip flexion 2x10 ?-RLE AAROM SLR 2x10 ?-hip adduction pillow squeezes 2x15 ?Concluded session with pt semi-reclined in bed with all needs within reach, awaiting upcoming OT session. 35 minutes missed of skilled physical therapy due to pt being off unit for x-ray.  ? ?Therapy  Documentation ?Precautions:  ?Precautions ?Precautions: Fall, Anterior Hip, Other (comment) ?Precaution Comments: HD access port RUQ, L LE anterior hip precautions, R LE rehab  protocol in pt's paper chart ?Required Braces or Orthoses: Knee Immobilizer - Right ?Knee Immobilizer - Right: On at all times, Other (comment) ?Restrictions ?Weight Bearing Restrictions: Yes ?RLE Weight Bearing: Touchdown weight bearing ?LLE Weight Bearing: Weight bearing as tolerated ?Other Position/Activity Restrictions: L LE anterior hip precautions - no hip extension, hip external rotation, or abduction ? ?Therapy/Group: Individual Therapy ?Blenda Nicely ?Becky Sax PT, DPT  ?10/31/2021, 7:27 AM  ?

## 2021-10-31 NOTE — Progress Notes (Signed)
Occupational Therapy Session Note ? ?Patient Details  ?Name: [name] ?MRN: [id number] ?Date of Birth: 07/28/1966 ? ?Today's Date: 11/01/2021 ?OT Individual Time: 9892-1194 ?OT Individual Time Calculation (min): 59 min  ? ? ?Skilled Therapeutic Interventions/Progress Updates:  ?  Pt greeted in bed, no c/o pain, ready to go outside like we had planned. Pt able to perform slideboard transfer with close supervision assist, pt able to place board herself before scooting to the w/c. OT escorted pt outside via w/c. While outdoors, discussed modified participation in leisure occupations at home (such as gardening with raised beds and canning fruits/vegetable). Also reviewed diaphragmatic breathing strategies for improving sleep, pain, and anxiety/stress levels. Pt performing breathing exercises outside with guided instruction. Pt also completing UB stretches with instruction to prevent muscle strain/injury. Pt has to rely on her UB mostly for transfers due to WB status and LB injuries. Pt discussing her medical journey and frustrations though very much motivated to maintain a positive mindset in terms of her therapeutic recovery. Therapeutic listening and support provided for psychosocial wellness. She was then escorted back to the room and completed another slideboard transfer at supervision level, back to the bed, min cues for board positioning. Left pt with all needs within reach. Tx focus placed on pt education and d/c planning today. ? ?Therapy Documentation ?Precautions:  ?Precautions ?Precautions: Fall, Anterior Hip, Other (comment) ?Precaution Comments: HD access port RUQ, L LE anterior hip precautions, R LE rehab protocol in pt's paper chart ?Required Braces or Orthoses: Knee Immobilizer - Right ?Knee Immobilizer - Right: On at all times, Other (comment) ?Restrictions ?Weight Bearing Restrictions: Yes ?RLE Weight Bearing: Touchdown weight bearing ?LLE Weight Bearing: Weight bearing as tolerated ?Other  Position/Activity Restrictions: L LE anterior hip precautions - no hip extension, hip external rotation, or abduction ?ADL: ?ADL ?Grooming: Setup ?Where Assessed-Grooming: Edge of bed ?Upper Body Bathing: Setup ?Where Assessed-Upper Body Bathing: Edge of bed ?Lower Body Bathing: Minimal assistance ?Where Assessed-Lower Body Bathing: Edge of bed ?Upper Body Dressing: Setup ?Where Assessed-Upper Body Dressing: Edge of bed ?Lower Body Dressing: Maximal assistance ?Where Assessed-Lower Body Dressing: Edge of bed ?Toileting: Not assessed ?Toilet Transfer: Not assessed ?Tub/Shower Transfer: Not assessed ? ? ?Therapy/Group: Individual Therapy ? ?Elicia Lui A Chauntae Hults ?11/01/2021, 4:16 PM ?

## 2021-10-31 NOTE — Progress Notes (Signed)
Occupational Therapy Weekly Progress Note ? ?Patient Details  ?Name: [name] ?MRN: [id number] ?Date of Birth: Jul 27, 1966 ? ?Beginning of progress report period: October 24, 2021 ?End of progress report period: October 31, 2021 ? ?Patient has met 1 of 2 short term goals.  Pt is making slow progress towards LTGs. Pt has progressed her functional and toilet transfers from Mod A +2 to slideboard to CGA of 1, is able to bathe at an overall set up A for seated bathing, dress at an overall min A level for LB management, and requires Max A for toileting tasks. Pt has progressed her lateral lean technique, especially while on BSC, however still demonstrates difficulty with clearing hips enough to complete LB clothing with toileting at a supervision or mod I level vs min A. Pt is agreeable to d/c at w/c level due to extensive education and recommendation by therapy that her limited progress with standing is not functional for ADL completion and not safe enough for transfers at home with her assist she has available. This therapist plans to upgrade pt's goals to mod I due to increased independence with w/c level functional tasks. ? ? ?Patient continues to demonstrate the following deficits: muscle weakness and muscle joint tightness, decreased cardiorespiratoy endurance, decreased coordination, and decreased standing balance, decreased balance strategies, and difficulty maintaining precautions and therefore will continue to benefit from skilled OT intervention to enhance overall performance with BADL and Reduce care partner burden. ? ?Patient progressing toward long term goals..  Continue plan of care. ? ?OT Short Term Goals ?Week 1:  OT Short Term Goal 1 (Week 1): Pt will complete LB dressing with Mod A using AE as needed ?OT Short Term Goal 1 - Progress (Week 1): Met ?OT Short Term Goal 2 (Week 1): Pt will complete BSC transfer with 1 assist using LRAD to promote OOB toileting ?OT Short Term Goal 2 - Progress (Week 1): Not  met ?OT Short Term Goal 3 (Week 1): Pt will complete 1/3 components of toileting with supervision ?OT Short Term Goal 3 - Progress (Week 1): Met ?Week 2:  OT Short Term Goal 1 (Week 2): Pt will complete BSC transfer with 1 assist using LRAD to promote OOB toileting ?OT Short Term Goal 1 - Progress (Week 2): Met ?OT Short Term Goal 2 (Week 2): Pt will complete 2/3 components of toileting with supervision ?OT Short Term Goal 2 - Progress (Week 2): Progressing toward goal ?Week 3:  OT Short Term Goal 1 (Week 3): STG = LTG due to ELOS ? ? ?Lan Mcneill E Elbia Paro ?10/31/2021, 7:56 AM  ?

## 2021-10-31 NOTE — Progress Notes (Addendum)
Completed pt's MRI as a "WO" study opposed to originally ordered "W WO". Unable administer contrast due to no IV access. Attempts were made by myself and a radiology RN to establish IV access for contrast injection. All attempts were unsuccessful. Pt then requested to be sent back and only if contrast is needed pending results of "WO" portion of exam to potentially attempt "W" portion at another time. Order will be changed to "WO" and completed as "WO" to reflect this. Was able to obtain complete "WO" study. ?

## 2021-11-01 DIAGNOSIS — M722 Plantar fascial fibromatosis: Secondary | ICD-10-CM

## 2021-11-01 LAB — GLUCOSE, CAPILLARY
Glucose-Capillary: 135 mg/dL — ABNORMAL HIGH (ref 70–99)
Glucose-Capillary: 110 mg/dL — ABNORMAL HIGH (ref 70–99)
Glucose-Capillary: 110 mg/dL — ABNORMAL HIGH (ref 70–99)
Glucose-Capillary: 179 mg/dL — ABNORMAL HIGH (ref 70–99)

## 2021-11-01 MED ORDER — DICLOFENAC SODIUM 1 % EX GEL
2.0000 g | Freq: Three times a day (TID) | CUTANEOUS | Status: DC
Start: 2021-11-01 — End: 2021-11-08
  Administered 2021-11-01 – 2021-11-07 (×15): 2 g via TOPICAL
  Filled 2021-11-01: qty 100

## 2021-11-01 NOTE — Progress Notes (Signed)
Occupational Therapy Session Note ? ?Patient Details  ?Name: [name] ?MRN: [id number] ?Date of Birth: 02/20/1966 ? ?Today's Date: 11/02/2021 ?OT Individual Time: 2376-2831 ?OT Individual Time Calculation (min): 29 min  ? ? ?Skilled Therapeutic Interventions/Progress Updates:  ?  Pt greeted in bed with pain manageable for tx. Very excited about new ROM guidelines for her Rt knee. Pt sitting with ice pack on knee because it was feeling a bit "sore." Pt showed OT pictures of the ramp that her spouse is building for her to access home. He is also making modifications to allow pt to go outside. Asked pt if she wanted an UB HEP to maintain functional strength at home. Pt reported that she will remember exercises/stretches that we complete together today. Guided her through B UE exercises using tband x10 reps 2 sets. Tband already in room, encouraged pt to take home with her to use. Provided instruction regarding scapular pinches and deep shoulder stretch. Pt appreciative. She remained in bed at close of session, all needs within reach and 4 bedrails up per her preference.  ? ?Therapy Documentation ?Precautions:  ?Precautions ?Precautions: Fall, Anterior Hip, Other (comment) ?Precaution Comments: HD access port RUQ, L LE anterior hip precautions, R LE rehab protocol in pt's paper chart ?Required Braces or Orthoses: Knee Immobilizer - Right ?Knee Immobilizer - Right: On at all times, Other (comment) ?Restrictions ?Weight Bearing Restrictions: Yes ?RLE Weight Bearing: Touchdown weight bearing ?LLE Weight Bearing: Weight bearing as tolerated ?Other Position/Activity Restrictions: L LE anterior hip precautions - no hip extension, hip external rotation, or abduction ?ADL: ?ADL ?Grooming: Setup ?Where Assessed-Grooming: Edge of bed ?Upper Body Bathing: Setup ?Where Assessed-Upper Body Bathing: Edge of bed ?Lower Body Bathing: Minimal assistance ?Where Assessed-Lower Body Bathing: Edge of bed ?Upper Body Dressing: Setup ?Where  Assessed-Upper Body Dressing: Edge of bed ?Lower Body Dressing: Maximal assistance ?Where Assessed-Lower Body Dressing: Edge of bed ?Toileting: Not assessed ?Toilet Transfer: Not assessed ?Tub/Shower Transfer: Not assessed ?:   ? ? ?Therapy/Group: Individual Therapy ? ?Margaret Cockerill A Larnell Granlund ?11/02/2021, 4:16 PM ?

## 2021-11-01 NOTE — Progress Notes (Signed)
PROGRESS NOTE   Subjective/Complaints: Pt aware of MRI findings. Still reports discomfort in the left foot. Apparently ortho was contacted about advancing rom in knee brace  ROS: Patient denies fever, rash, sore throat, blurred vision, dizziness, nausea, vomiting, diarrhea, cough, shortness of breath or chest pain,   headache, or mood change.   Objective:   MR FOOT LEFT WO CONTRAST  Result Date: 10/31/2021 CLINICAL DATA:  Left foot pain. EXAM: MRI OF THE LEFT FOOT WITHOUT CONTRAST TECHNIQUE: Multiplanar, multisequence MR imaging of the left forefoot was performed. No intravenous contrast was administered. COMPARISON:  Left foot x-rays from same day. FINDINGS: Bones/Joint/Cartilage No suspicious marrow signal abnormality. No fracture or dislocation. Mild second TMT joint osteoarthritis. No joint effusion. Ligaments Collateral ligaments are intact. Muscles and Tendons Flexor and extensor tendons are intact. Increased T2 signal within and atrophy of the intrinsic muscles of the forefoot, nonspecific, but likely related to diabetic muscle changes. Soft tissue Dorsal forefoot soft tissue swelling. No fluid collection or hematoma. No soft tissue mass. IMPRESSION: 1. No acute abnormality. Specifically, no fracture of the fourth proximal phalanx. Electronically Signed   By: Titus Dubin M.D.   On: 10/31/2021 14:38   DG Foot Complete Left  Result Date: 10/31/2021 CLINICAL DATA:  Beginning to walk after knee surgery and felt pain and swelling in left foot. EXAM: LEFT FOOT - COMPLETE 3+ VIEW COMPARISON:  None. FINDINGS: There is diffuse decreased bone mineralization. Mild joint space narrowing of the interphalangeal joints diffusely. Mild dorsal tarsometatarsal degenerative osteophytes seen on lateral view. Small plantar calcaneal heel spur. Mild chronic spurring at the Achilles insertion on the calcaneus. Mild posterior distal tibial plafond  degenerative osteophytosis. Vascular calcifications are noted. Mild dorsal forefoot soft tissue swelling. Within the limitations of decreased bone mineralization, no definite acute fracture is visualized, however it is difficult to exclude a possible nondisplaced fracture seen on oblique view only within the proximal metaphysis of the proximal phalanx of fourth toe given overlapping bones versus minimal cortical step-off medially. No dislocation. IMPRESSION: Diffuse decreased bone mineralization limits evaluation for an acute fracture. There is soft tissue swelling of the dorsal aspect of the forefoot. No definitive fracture line is seen, however on oblique view there is question of minimal lucency and cortical step-off that may represent normal overlap of bones versus a tiny nondisplaced fracture. Recommend clinical correlation for point tenderness. Electronically Signed   By: Yvonne Kendall M.D.   On: 10/31/2021 10:35   Recent Labs    10/29/21 1325  WBC 6.6  HGB 9.1*  HCT 27.6*  PLT 261       Recent Labs    10/29/21 1326 10/31/21 0550  NA 138  --   K 4.2  --   CL 99  --   CO2 25  --   GLUCOSE 184*  --   BUN 41*  --   CREATININE 6.07* 5.22*  CALCIUM 9.3  --       Intake/Output Summary (Last 24 hours) at 11/01/2021 1202 Last data filed at 11/01/2021 0816 Gross per 24 hour  Intake 240 ml  Output --  Net 240 ml  Physical Exam: Vital Signs Blood pressure (!) 147/69, pulse 84, temperature 98.2 F (36.8 C), temperature source Oral, resp. rate 18, height 5\' 7"  (1.702 m), weight 78 kg, SpO2 97 %. Constitutional: No distress . Vital signs reviewed. HEENT: NCAT, EOMI, oral membranes moist Neck: supple Cardiovascular: RRR without murmur. No JVD    Respiratory/Chest: CTA Bilaterally without wheezes or rales. Normal effort    GI/Abdomen: BS +, non-tender, non-distended Ext: no clubbing, cyanosis, or edema Psych: pleasant and cooperative  Musculoskeletal:        General:  Swelling and tenderness present.     Cervical back: Normal range of motion and neck supple.     Right lower leg: Edema present. In brace    Left lower leg: Edema present.     Comments: Left hip tender to palpation and PROM with associated swelling. Left knee with mild effusion, patella laxity. Right hip tender with leg raise  Right knee tender and swollen, ace bandage in place Left foot tender over metatarsals in particular Skin:    Comments: Incision site dressing clean dry and intact with post-op dressing in place.  R IJ TDC with bandage Neuro: Alert and oriented x 3. Normal insight and awareness. Intact Memory. Normal language and speech. Cranial nerve exam unremarkable  Motor: Bilateral upper extremities: 5/5 proximal distal Bilateral lower extremities:  Right lower extremity: Limited by bracing, hip flexion?  2/5   Assessment/Plan: 1. Functional deficits which require 3+ hours per day of interdisciplinary therapy in a comprehensive inpatient rehab setting. Physiatrist is providing close team supervision and 24 hour management of active medical problems listed below. Physiatrist and rehab team continue to assess barriers to discharge/monitor patient progress toward functional and medical goals  Care Tool:  Bathing    Body parts bathed by patient: Face   Body parts bathed by helper: Left lower leg Body parts n/a: Right lower leg   Bathing assist Assist Level: Independent with assistive device     Upper Body Dressing/Undressing Upper body dressing   What is the patient wearing?: Dress    Upper body assist Assist Level: Set up assist    Lower Body Dressing/Undressing Lower body dressing      What is the patient wearing?: Pants     Lower body assist Assist for lower body dressing: Minimal Assistance - Patient > 75%     Toileting Toileting Toileting Activity did not occur (Clothing management and hygiene only): N/A (no void or bm)  Toileting assist Assist for  toileting: Maximal Assistance - Patient 25 - 49%     Transfers Chair/bed transfer  Transfers assist     Chair/bed transfer assist level: Contact Guard/Touching assist Chair/bed transfer assistive device: Sliding board   Locomotion Ambulation   Ambulation assist   Ambulation activity did not occur: Safety/medical concerns          Walk 10 feet activity   Assist  Walk 10 feet activity did not occur: Safety/medical concerns        Walk 50 feet activity   Assist Walk 50 feet with 2 turns activity did not occur: Safety/medical concerns         Walk 150 feet activity   Assist Walk 150 feet activity did not occur: Safety/medical concerns         Walk 10 feet on uneven surface  activity   Assist Walk 10 feet on uneven surfaces activity did not occur: Safety/medical concerns         Wheelchair  Assist Is the patient using a wheelchair?: Yes Type of Wheelchair: Manual    Wheelchair assist level: Supervision/Verbal cueing, Set up assist Max wheelchair distance: 189ft    Wheelchair 50 feet with 2 turns activity    Assist        Assist Level: Supervision/Verbal cueing   Wheelchair 150 feet activity     Assist      Assist Level: Supervision/Verbal cueing   Blood pressure (!) 147/69, pulse 84, temperature 98.2 F (36.8 C), temperature source Oral, resp. rate 18, height 5\' 7"  (1.702 m), weight 78 kg, SpO2 97 %.    Medical Problem List and Plan: 1. Functional deficits secondary to nondisplaced fracture right inferior pubic ramus as well as a displaced foreshortened fracture through the left femoral neck/intertrochanteric area.  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   -Weightbearing as tolerated with anterior total hip precautions.                                  -Conservative care right inferior pubic ramus fracture, weightbearing as tolerated             -patient may  shower             -ELOS/Goals: 22  days total, supervision to min assist goals  -Continue CIR therapies including PT, OT   HFU scheduled. 2.  Impaired mobility -DVT/anticoagulation:  Mechanical: Antiembolism stockings, thigh (TED hose) Bilateral lower extremities. Vascular ultrasound reviewed and negative for clot.  -should be able to begin lovenox today as permacath placed            -antiplatelet therapy: N/A 3. Femur fracture pain: conitnue Flector patch, Robaxin 750 mg 4 times daily, hydrocodone and oxycodone as needed             -pt having significant spasms in left thigh.             -add kpad to help with spasms  -schedule tylenol 1000mg  TID  Appears to be controlled on 2/25 4. Anxiety: Instructed on deep breathing techniques. Provide emotional support.  Melatonin as needed             -antipsychotic agents: N/A 5. Neuropsych: This patient is capable of making decisions on her own behalf. 6. Skin/Wound Care: Routine skin checks 7. Fluids/Electrolytes/Nutrition: Routine in and outs with follow-up chemistries 8.  ESRD on HD.  Permacath exchanged 10/03/2021.    -  Follow-up hemodialysis per Dr. [name] -Messaged therapy to see if we can practice transfers from inclined chair as she is nervous about this transfer during outpatient dialysis,  -HD later in day to allow participation in therapies during the day Appreciate nephro recs 9.  Acute on chronic anemia.  Has had transfusions already x 2. Hgb has stabilized in 7 range. continue iron supplement.              -epo per nephrology  Hemoglobin 8.7 on 2/24, appreciate nephro recs 10.  Diabetes mellitus with peripheral neuropathy. Placed order that she may use her Dexcom 6. Hemoglobin A1c 6.2.  SSI as prior to admission. Provide dietary education.  CBG (last 3)  Recent Labs    10/31/21 1850 10/31/21 2052 11/01/21 0617  GLUCAP 143* 255* 135*  Some lability will monitor. Increase Semglee to 18U, discussed plan to transition back to her novolog at home  3/4 semglee  change in effect today---monitor for pattern 11.  Hypertension. Decrease  Cozaar to 75mg  to allow more fluid to be removed with HD Vitals:   10/31/21 2009 11/01/21 0413  BP: (!) 142/63 (!) 147/69  Pulse: 91 84  Resp: 16 18  Temp: 99.1 F (37.3 C) 98.2 F (36.8 C)  SpO2: 99% 97%   Mildly elevated, avoid overtreatment due to dialysis 12.  Hyperparathyroidism of renal origin.  Plan outpatient parathyroidectomy per Dr.Lateef 13.  Overweight.  BMI 29.11-->26.35  Dietary follow-up. Provided list of foods that can help with weight loss. Discussed benefits of intermittent fasting.  14. Cushingoid?--outpt work up 74. Constipation: resolved. Decrease colace to daily prn. D/c senna. Discussed benefits of high fiber foods. Continue bowel regimen. D/c miralax.  16. Right quadriceps tear: surgery complete with good healing on XR, minimal postop pain, discussed importance of participating in 3 hours therapy daily as much as she can tolerate and she is very motivated and excited for rehab, remove dressing today  -trying to contact surgeon regarding about advancing ROM 17. Gas: continue simethicone prn 18. Ileus: resolved, regular diet resumed -tolerating.  19. Abd spasms: resolved 20. Black tarry stools: discussed heme occult negative last night. Hgb reviewed and stable.  21. Vitamin D deficiency: continue ergocalciferol 50,000U once per week for 7 weeks. Discussed that this dose is higher than a daily supplement.  22. Left heel pain and. Left foot pain over metatarsals-  3/4 MRI was reviewed with patient. There is associated atrophy of the forefoot muscles likely associated with her diabetes but no signs of fx, inflammation, etc.   -recommend appropriate shoewear to support arch and metatarsals  -add voltaren gel to help with pain control.  -stretching of arch/foot with therapies     LOS: 15 days A FACE TO [place] 11/01/2021, 12:02 PM

## 2021-11-02 LAB — GLUCOSE, CAPILLARY
Glucose-Capillary: 140 mg/dL — ABNORMAL HIGH (ref 70–99)
Glucose-Capillary: 168 mg/dL — ABNORMAL HIGH (ref 70–99)
Glucose-Capillary: 129 mg/dL — ABNORMAL HIGH (ref 70–99)
Glucose-Capillary: 188 mg/dL — ABNORMAL HIGH (ref 70–99)

## 2021-11-02 NOTE — Progress Notes (Signed)
Physical Therapy Session Note ? ?Patient Details  ?Name: [name] ?MRN: [id number] ?Date of Birth: [birth year] ? ?Today's Date: 11/02/2021 ?PT Individual Time: 3295-1884 ?PT Individual Time Calculation (min): 45 min  ? ?Short Term Goals: ?Week 3:  PT Short Term Goal 1 (Week 3): STG=LTG due to LOS ? ?Skilled Therapeutic Interventions/Progress Updates:  ?  Pt received seated in bed, eager for therapy session. No complaints of pain at rest, does have onset of R knee soreness with mobility. Provided ice pack at end of session for pain management. Per pt report her ROM orders have increased to 30 degrees knee flexion on R knee, confirmed via MD note in chart and updated PT orders. Session focus on seated RLE knee ROM and LLE strengthening therex. Assisted pt with adjusting R Bledsoe brace to 30 degrees ROM knee flexion. While seated EOB pt able to perform knee flexion/extension in limited range with support for limb due to muscular weakness and soreness from immobility. Seated LLE strengthening therex: marches, LAQ with .75 lb ankle weight, hip add squeeze, HS curl with orange theraband x 10-15 reps each. Pt requesting to remain seated EOB at end of session with R knee in semi-flexed position, supported by pillow, ice pack in place, needs in reach. ? ?Therapy Documentation ?Precautions:  ?Precautions ?Precautions: Fall, Anterior Hip, Other (comment) ?Precaution Comments: HD access port RUQ, L LE anterior hip precautions, R LE rehab protocol in pt's paper chart ?Required Braces or Orthoses: Knee Immobilizer - Right ?Knee Immobilizer - Right: On at all times, Other (comment) ?Restrictions ?Weight Bearing Restrictions: Yes ?RLE Weight Bearing: Touchdown weight bearing ?LLE Weight Bearing: Weight bearing as tolerated ?Other Position/Activity Restrictions: L LE anterior hip precautions - no hip extension, hip external rotation, or abduction ? ? ? ? ? ?Therapy/Group: Individual Therapy ? ? ?Excell Seltzer, PT, DPT,  CSRS ? ?11/02/2021, 12:11 PM  ?

## 2021-11-02 NOTE — Progress Notes (Signed)
PROGRESS NOTE   Subjective/Complaints: Pt in good spirits. No new issues. Slept well. Pain controlled at present  ROS: Patient denies fever, rash, sore throat, blurred vision, dizziness, nausea, vomiting, diarrhea, cough, shortness of breath or chest pain, headache, or mood change.   Objective:   MR FOOT LEFT WO CONTRAST  Result Date: 10/31/2021 CLINICAL DATA:  Left foot pain. EXAM: MRI OF THE LEFT FOOT WITHOUT CONTRAST TECHNIQUE: Multiplanar, multisequence MR imaging of the left forefoot was performed. No intravenous contrast was administered. COMPARISON:  Left foot x-rays from same day. FINDINGS: Bones/Joint/Cartilage No suspicious marrow signal abnormality. No fracture or dislocation. Mild second TMT joint osteoarthritis. No joint effusion. Ligaments Collateral ligaments are intact. Muscles and Tendons Flexor and extensor tendons are intact. Increased T2 signal within and atrophy of the intrinsic muscles of the forefoot, nonspecific, but likely related to diabetic muscle changes. Soft tissue Dorsal forefoot soft tissue swelling. No fluid collection or hematoma. No soft tissue mass. IMPRESSION: 1. No acute abnormality. Specifically, no fracture of the fourth proximal phalanx. Electronically Signed   By: Titus Dubin M.D.   On: 10/31/2021 14:38   DG Foot Complete Left  Result Date: 10/31/2021 CLINICAL DATA:  Beginning to walk after knee surgery and felt pain and swelling in left foot. EXAM: LEFT FOOT - COMPLETE 3+ VIEW COMPARISON:  None. FINDINGS: There is diffuse decreased bone mineralization. Mild joint space narrowing of the interphalangeal joints diffusely. Mild dorsal tarsometatarsal degenerative osteophytes seen on lateral view. Small plantar calcaneal heel spur. Mild chronic spurring at the Achilles insertion on the calcaneus. Mild posterior distal tibial plafond degenerative osteophytosis. Vascular calcifications are noted. Mild  dorsal forefoot soft tissue swelling. Within the limitations of decreased bone mineralization, no definite acute fracture is visualized, however it is difficult to exclude a possible nondisplaced fracture seen on oblique view only within the proximal metaphysis of the proximal phalanx of fourth toe given overlapping bones versus minimal cortical step-off medially. No dislocation. IMPRESSION: Diffuse decreased bone mineralization limits evaluation for an acute fracture. There is soft tissue swelling of the dorsal aspect of the forefoot. No definitive fracture line is seen, however on oblique view there is question of minimal lucency and cortical step-off that may represent normal overlap of bones versus a tiny nondisplaced fracture. Recommend clinical correlation for point tenderness. Electronically Signed   By: Yvonne Kendall M.D.   On: 10/31/2021 10:35   No results for input(s): WBC, HGB, HCT, PLT in the last 72 hours.      Recent Labs    10/31/21 0550  CREATININE 5.22*      Intake/Output Summary (Last 24 hours) at 11/02/2021 0748 Last data filed at 11/01/2021 1840 Gross per 24 hour  Intake 720 ml  Output --  Net 720 ml         Physical Exam: Vital Signs Blood pressure (!) 163/73, pulse 86, temperature 98.7 F (37.1 C), temperature source Oral, resp. rate 19, height 5\' 7"  (1.702 m), weight 78 kg, SpO2 100 %. Constitutional: No distress . Vital signs reviewed. HEENT: NCAT, EOMI, oral membranes moist Neck: supple Cardiovascular: RRR without murmur. No JVD    Respiratory/Chest: CTA Bilaterally without wheezes or rales.  Normal effort    GI/Abdomen: BS +, non-tender, non-distended Ext: no clubbing, cyanosis, or edema Psych: pleasant and cooperative  Musculoskeletal:        General: Decreased Swelling and tenderness present.     Cervical back: Normal range of motion and neck supple.     Right lower leg:  In brace         Comments: Left hip tender to palpation and PROM with associated  swelling. Left knee with mild effusion, patella laxity. Right hip tender with leg raise  Right knee still with swelling Left foot with tenderness over metatarsals in particular Skin:    Comments: Incision site dressing clean dry and intact with post-op dressing in place.  R IJ TDC with bandage Neuro: Alert and oriented x 3. Normal insight and awareness. Intact Memory. Normal language and speech. Cranial nerve exam unremarkable  Motor: Bilateral upper extremities: 5/5 proximal distal Bilateral lower extremities:  Right lower extremity: Limited by bracing, hip flexion?  2/5   Assessment/Plan: 1. Functional deficits which require 3+ hours per day of interdisciplinary therapy in a comprehensive inpatient rehab setting. Physiatrist is providing close team supervision and 24 hour management of active medical problems listed below. Physiatrist and rehab team continue to assess barriers to discharge/monitor patient progress toward functional and medical goals  Care Tool:  Bathing    Body parts bathed by patient: Face   Body parts bathed by helper: Left lower leg Body parts n/a: Right lower leg   Bathing assist Assist Level: Independent with assistive device     Upper Body Dressing/Undressing Upper body dressing   What is the patient wearing?: Dress    Upper body assist Assist Level: Set up assist    Lower Body Dressing/Undressing Lower body dressing      What is the patient wearing?: Pants     Lower body assist Assist for lower body dressing: Minimal Assistance - Patient > 75%     Toileting Toileting Toileting Activity did not occur (Clothing management and hygiene only): N/A (no void or bm)  Toileting assist Assist for toileting: Maximal Assistance - Patient 25 - 49%     Transfers Chair/bed transfer  Transfers assist     Chair/bed transfer assist level: Contact Guard/Touching assist Chair/bed transfer assistive device: Sliding board    Locomotion Ambulation   Ambulation assist   Ambulation activity did not occur: Safety/medical concerns          Walk 10 feet activity   Assist  Walk 10 feet activity did not occur: Safety/medical concerns        Walk 50 feet activity   Assist Walk 50 feet with 2 turns activity did not occur: Safety/medical concerns         Walk 150 feet activity   Assist Walk 150 feet activity did not occur: Safety/medical concerns         Walk 10 feet on uneven surface  activity   Assist Walk 10 feet on uneven surfaces activity did not occur: Safety/medical concerns         Wheelchair     Assist Is the patient using a wheelchair?: Yes Type of Wheelchair: Manual    Wheelchair assist level: Supervision/Verbal cueing, Set up assist Max wheelchair distance: 162ft    Wheelchair 50 feet with 2 turns activity    Assist        Assist Level: Supervision/Verbal cueing   Wheelchair 150 feet activity     Assist      Assist Level:  Supervision/Verbal cueing   Blood pressure (!) 163/73, pulse 86, temperature 98.7 F (37.1 C), temperature source Oral, resp. rate 19, height 5\' 7"  (1.702 m), weight 78 kg, SpO2 100 %.    Medical Problem List and Plan: 1. Functional deficits secondary to nondisplaced fracture right inferior pubic ramus as well as a displaced foreshortened fracture through the left femoral neck/intertrochanteric area.  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   -Weightbearing as tolerated with anterior total hip precautions.                                  -Conservative care right inferior pubic ramus fracture, weightbearing as tolerated             -patient may  shower             -ELOS/Goals: 22 days total, supervision to min assist goals  -Continue CIR therapies including PT, OT  -3/5 communicated with ortho, found post-op therapy directions.   -0-30 degrees flexion weeks 3-4   -0-60 degrees weeks 5-6  HFU  scheduled. 2.  Impaired mobility -DVT/anticoagulation:  Mechanical: Antiembolism stockings, thigh (TED hose) Bilateral lower extremities. Vascular ultrasound reviewed and negative for clot.  -should be able to begin lovenox today as permacath placed            -antiplatelet therapy: N/A 3. Femur fracture pain: conitnue Flector patch, Robaxin 750 mg 4 times daily, hydrocodone and oxycodone as needed             -pt having significant spasms in left thigh.             -added kpad to help with spasms  -schedule tylenol 1000mg  TID  Appears to be controlled on 3/5 4. Anxiety: Instructed on deep breathing techniques. Provide emotional support.  Melatonin as needed             -antipsychotic agents: N/A 5. Neuropsych: This patient is capable of making decisions on her own behalf. 6. Skin/Wound Care: Routine skin checks 7. Fluids/Electrolytes/Nutrition: Routine in and outs with follow-up chemistries 8.  ESRD on HD.  Permacath exchanged 10/03/2021.    -  Follow-up hemodialysis per Dr. [name] -Messaged therapy to see if we can practice transfers from inclined chair as she is nervous about this transfer during outpatient dialysis,  -HD later in day to allow participation in therapies during the day Appreciate nephro recs 9.  Acute on chronic anemia.  Has had transfusions already x 2. Hgb has stabilized in 7 range. continue iron supplement.              -epo per nephrology  Hemoglobin 8.7 on 2/24, appreciate nephro recs 10.  Diabetes mellitus with peripheral neuropathy. Placed order that she may use her Dexcom 6. Hemoglobin A1c 6.2.  SSI as prior to admission. Provide dietary education.  CBG (last 3)  Recent Labs    11/01/21 1646 11/01/21 2113 11/02/21 0622  GLUCAP 110* 179* 140*  Some lability will monitor. Increase Semglee to 18U, discussed plan to transition back to her novolog at home  3/5 improved control. Follow for pattern 11.  Hypertension. Decrease Cozaar to 75mg  to allow more fluid to  be removed with HD Vitals:   11/01/21 2006 11/02/21 0454  BP: (!) 141/64 (!) 163/73  Pulse: 77 86  Resp: 19 19  Temp: 99 F (37.2 C) 98.7 F (37.1 C)  SpO2: 99% 100%   Mildly elevated,  avoid overtreatment due to dialysis 12.  Hyperparathyroidism of renal origin.  Plan outpatient parathyroidectomy per Dr.Lateef 13.  Overweight.  BMI 29.11-->26.35  Dietary follow-up. Provided list of foods that can help with weight loss. Discussed benefits of intermittent fasting.  14. Cushingoid?--outpt work up 41. Constipation: resolved. Decrease colace to daily prn. D/c senna. Discussed benefits of high fiber foods. Continue bowel regimen. D/c miralax.  16. Right quadriceps tear: surgery complete with good healing on XR, minimal postop pain, discussed importance of participating in 3 hours therapy daily as much as she can tolerate and she is very motivated and excited for rehab, remove dressing today  -ROM as above 17. Gas: continue simethicone prn 18. Ileus: resolved, regular diet resumed -tolerating.  19. Abd spasms: resolved 20. Black tarry stools: discussed heme occult negative last night. Hgb reviewed and stable.  21. Vitamin D deficiency: continue ergocalciferol 50,000U once per week for 7 weeks. Discussed that this dose is higher than a daily supplement.  22. Left heel pain and. Left foot pain over metatarsals-  3/5 MRI was reviewed with patient. There is associated atrophy of the forefoot muscles likely associated with her diabetes but no signs of fx, inflammation, etc.   -recommend appropriate shoewear to support arch and metatarsals  -added voltaren gel to help with pain control.  -stretching of arch/heel cord with therapies     LOS: 16 days A FACE TO FACE EVALUATION WAS PERFORMED  Meredith Staggers 11/02/2021, 7:48 AM

## 2021-11-02 NOTE — Progress Notes (Signed)
?[facility] ?Progress Note  ? ?Subjective:   Feeling well today.  No issues- increased knee flexion today ? ?Objective ?Vitals:  ? 11/01/21 0413 11/01/21 1406 11/01/21 2006 11/02/21 0454  ?BP: (!) 147/69 (!) 151/66 (!) 141/64 (!) 163/73  ?Pulse: 84 86 77 86  ?Resp: 18 16 19 19   ?Temp: 98.2 ?F (36.8 ?C) 98.3 ?F (36.8 ?C) 99 ?F (37.2 ?C) 98.7 ?F (37.1 ?C)  ?TempSrc: Oral Oral Oral Oral  ?SpO2: 97% 100% 99% 100%  ?Weight: 78 kg     ?Height:      ? ?Physical Exam ?General:chronically ill appearing female in NAD ?Heart:RRR, no mrg ?Lungs:CTAB, nml WOB ?Abdomen:soft, NTND ?Extremities:no LE edema, R leg in brace ?Dialysis Access: [facility]  ? ?Filed Weights  ? 10/31/21 7793 10/31/21 1503 11/01/21 0413  ?Weight: 78 kg 79 kg 78 kg  ? ? ?Intake/Output Summary (Last 24 hours) at 11/02/2021 1137 ?Last data filed at 11/02/2021 0703 ?Gross per 24 hour  ?Intake 840 ml  ?Output --  ?Net 840 ml  ? ? ?Additional Objective ?Labs: ?Basic Metabolic Panel: ?Recent Labs  ?Lab 10/27/21 ?1248 10/29/21 ?1326 10/31/21 ?9030  ?NA 136 138  --   ?K 5.0 4.2  --   ?CL 96* 99  --   ?CO2 23 25  --   ?GLUCOSE 202* 184*  --   ?BUN 55* 41*  --   ?CREATININE 7.31* 6.07* 5.22*  ?CALCIUM 9.1 9.3  --   ?PHOS 4.9* 4.6  --   ? ?Liver Function Tests: ?Recent Labs  ?Lab 10/27/21 ?1248 10/29/21 ?1326 10/31/21 ?0923  ?AST  --   --  16  ?ALT  --   --  9  ?ALKPHOS  --   --  719*  ?BILITOT  --   --  0.9  ?PROT  --   --  6.2*  ?ALBUMIN 3.3* 3.3* 3.3*  ? ?CBC: ?Recent Labs  ?Lab 10/27/21 ?1248 10/29/21 ?1325  ?WBC 6.8 6.6  ?HGB 8.7* 9.1*  ?HCT 27.6* 27.6*  ?MCV 101.5* 100.7*  ?PLT 245 261  ? ?Medications: ? ? acetaminophen  650 mg Oral TID  ? Chlorhexidine Gluconate Cloth  6 each Topical Q0600  ? Chlorhexidine Gluconate Cloth  6 each Topical Q0600  ? darbepoetin (ARANESP) injection - DIALYSIS  60 mcg Intravenous Q Fri-HD  ? diclofenac Sodium  2 g Topical TID  ? enoxaparin (LOVENOX) injection  30 mg Subcutaneous Q24H  ? insulin aspart  0-9 Units Subcutaneous  TID WC  ? insulin glargine-yfgn  18 Units Subcutaneous Daily  ? losartan  75 mg Oral Daily  ? Vitamin D (Ergocalciferol)  50,000 Units Oral Q7 days  ? ? ?Dialysis Orders: ?MWF [facility] [phone]) ? 3h 42min  81kg   Hep 3000 then 800u/hr  RIJ TDC ?   - Hep B SAg neg 2/8, covid neg 1/29 ?  ?Assessment/Plan: ?Debility - rehab per CIR ?RLE tendon rupture - s/p surgery 2/16 ?L hip fracture/ R pelvic ramus fx - sp [facility], then sent here for CIR ?ESRD - on HD MWF. next HD 11/03/21 ?BP/ volume - increased cozaar to 100 mg daily ?DM2 - per pmd ?Anemia ckd - Hgb trending down, last 8.7. S/p 1 unit prbc on 2/3.  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  Continue binders.  ?9. Nutrition - renal diet ?10.  Dispo: 3/11 discharge ? ?Madelon Lips MD ?  [facility] ?11/02/2021,11:37 AM ? LOS: 16 days  ? ? ?

## 2021-11-03 LAB — RENAL FUNCTION PANEL
Albumin: 3.6 g/dL (ref 3.5–5.0)
Anion gap: 16 — ABNORMAL HIGH (ref 5–15)
BUN: 51 mg/dL — ABNORMAL HIGH (ref 6–20)
CO2: 22 mmol/L (ref 22–32)
Calcium: 9.3 mg/dL (ref 8.9–10.3)
Chloride: 95 mmol/L — ABNORMAL LOW (ref 98–111)
Creatinine, Ser: 6.97 mg/dL — ABNORMAL HIGH (ref 0.44–1.00)
GFR, Estimated: 6 mL/min — ABNORMAL LOW (ref >60)
Glucose, Bld: 127 mg/dL — ABNORMAL HIGH (ref 70–99)
Phosphorus: 5.2 mg/dL — ABNORMAL HIGH (ref 2.5–4.6)
Potassium: 5.7 mmol/L — ABNORMAL HIGH (ref 3.5–5.1)
Sodium: 133 mmol/L — ABNORMAL LOW (ref 135–145)

## 2021-11-03 LAB — CBC WITH DIFFERENTIAL/PLATELET
Abs Immature Granulocytes: 0.03 10*3/uL (ref 0.00–0.07)
Basophils Absolute: 0 10*3/uL (ref 0.0–0.1)
Basophils Relative: 1 %
Eosinophils Absolute: 0.2 10*3/uL (ref 0.0–0.5)
Eosinophils Relative: 4 %
HCT: 28.9 % — ABNORMAL LOW (ref 36.0–46.0)
Hemoglobin: 9.1 g/dL — ABNORMAL LOW (ref 12.0–15.0)
Immature Granulocytes: 1 %
Lymphocytes Relative: 11 %
Lymphs Abs: 0.7 10*3/uL (ref 0.7–4.0)
MCH: 32.5 pg (ref 26.0–34.0)
MCHC: 31.5 g/dL (ref 30.0–36.0)
MCV: 103.2 fL — ABNORMAL HIGH (ref 80.0–100.0)
Monocytes Absolute: 0.5 10*3/uL (ref 0.1–1.0)
Monocytes Relative: 8 %
Neutro Abs: 4.6 10*3/uL (ref 1.7–7.7)
Neutrophils Relative %: 75 %
Platelets: 222 10*3/uL (ref 150–400)
RBC: 2.8 MIL/uL — ABNORMAL LOW (ref 3.87–5.11)
RDW: 16.2 % — ABNORMAL HIGH (ref 11.5–15.5)
WBC: 6.1 10*3/uL (ref 4.0–10.5)
nRBC: 0 % (ref 0.0–0.2)

## 2021-11-03 LAB — GLUCOSE, CAPILLARY
Glucose-Capillary: 129 mg/dL — ABNORMAL HIGH (ref 70–99)
Glucose-Capillary: 149 mg/dL — ABNORMAL HIGH (ref 70–99)
Glucose-Capillary: 148 mg/dL — ABNORMAL HIGH (ref 70–99)
Glucose-Capillary: 209 mg/dL — ABNORMAL HIGH (ref 70–99)

## 2021-11-03 LAB — HEPATITIS B SURFACE ANTIGEN: Hepatitis B Surface Ag: NONREACTIVE

## 2021-11-03 MED ORDER — INSULIN GLARGINE-YFGN 100 UNIT/ML ~~LOC~~ SOLN
19.0000 [IU] | Freq: Every day | SUBCUTANEOUS | Status: DC
  Filled 2021-11-03: qty 0.19

## 2021-11-03 MED ORDER — HEPARIN SODIUM (PORCINE) 1000 UNIT/ML IJ SOLN: 4000.0000 [IU] | Freq: Once | INTRAMUSCULAR | Status: DC

## 2021-11-03 MED ORDER — HEPARIN SODIUM (PORCINE) 1000 UNIT/ML IJ SOLN
INTRAMUSCULAR | Status: AC
  Administered 2021-11-03: 3200 [IU]
  Filled 2021-11-03: qty 4

## 2021-11-03 NOTE — Progress Notes (Signed)
PROGRESS NOTE   Subjective/Complaints: Good spirits Excited to go to 30 degrees again today Appreciate SW ordering equipment Provided dietary education  ROS: Patient denies fever, rash, sore throat, blurred vision, dizziness, nausea, vomiting, diarrhea, cough, shortness of breath or chest pain, headache, or mood change.   Objective:   No results found. No results for input(s): WBC, HGB, HCT, PLT in the last 72 hours.      No results for input(s): NA, K, CL, CO2, GLUCOSE, BUN, CREATININE, CALCIUM in the last 72 hours.     Intake/Output Summary (Last 24 hours) at 11/03/2021 1228 Last data filed at 11/03/2021 0809 Gross per 24 hour  Intake 477 ml  Output --  Net 477 ml         Physical Exam: Vital Signs Blood pressure (!) 180/76, pulse 87, temperature 98 F (36.7 C), temperature source Oral, resp. rate 15, height 5\' 7"  (1.702 m), weight 77.2 kg, SpO2 100 %. Gen: no distress, normal appearing HEENT: oral mucosa pink and moist, NCAT Cardio: Reg rate Chest: normal effort, normal rate of breathing Abd: soft, non-distended Ext: no edema Psych: pleasant, normal affect  Musculoskeletal:        General: Decreased Swelling and tenderness present.     Cervical back: Normal range of motion and neck supple.     Right lower leg:  In brace         Comments: Left hip tender to palpation and PROM with associated swelling. Left knee with mild effusion, patella laxity. Right hip tender with leg raise  Right knee still with swelling Left foot with tenderness over metatarsals in particular Skin:    Comments: Incision site dressing clean dry and intact with post-op dressing in place.  R IJ TDC with bandage Neuro: Alert and oriented x 3. Normal insight and awareness. Intact Memory. Normal language and speech. Cranial nerve exam unremarkable  Motor: Bilateral upper extremities: 5/5 proximal distal Bilateral lower extremities:   Right lower extremity: Limited by bracing, hip flexion?  2/5   Assessment/Plan: 1. Functional deficits which require 3+ hours per day of interdisciplinary therapy in a comprehensive inpatient rehab setting. Physiatrist is providing close team supervision and 24 hour management of active medical problems listed below. Physiatrist and rehab team continue to assess barriers to discharge/monitor patient progress toward functional and medical goals  Care Tool:  Bathing    Body parts bathed by patient: Face   Body parts bathed by helper: Left lower leg Body parts n/a: Right lower leg   Bathing assist Assist Level: Independent with assistive device     Upper Body Dressing/Undressing Upper body dressing   What is the patient wearing?: Dress    Upper body assist Assist Level: Set up assist    Lower Body Dressing/Undressing Lower body dressing      What is the patient wearing?: Pants     Lower body assist Assist for lower body dressing: Minimal Assistance - Patient > 75%     Toileting Toileting Toileting Activity did not occur (Clothing management and hygiene only): N/A (no void or bm)  Toileting assist Assist for toileting: Maximal Assistance - Patient 25 - 49%     Transfers  Chair/bed transfer  Transfers assist     Chair/bed transfer assist level: Contact Guard/Touching assist Chair/bed transfer assistive device: Sliding board   Locomotion Ambulation   Ambulation assist   Ambulation activity did not occur: Safety/medical concerns          Walk 10 feet activity   Assist  Walk 10 feet activity did not occur: Safety/medical concerns        Walk 50 feet activity   Assist Walk 50 feet with 2 turns activity did not occur: Safety/medical concerns         Walk 150 feet activity   Assist Walk 150 feet activity did not occur: Safety/medical concerns         Walk 10 feet on uneven surface  activity   Assist Walk 10 feet on uneven surfaces  activity did not occur: Safety/medical concerns         Wheelchair     Assist Is the patient using a wheelchair?: Yes Type of Wheelchair: Manual    Wheelchair assist level: Supervision/Verbal cueing Max wheelchair distance: 161ft    Wheelchair 50 feet with 2 turns activity    Assist        Assist Level: Supervision/Verbal cueing   Wheelchair 150 feet activity     Assist      Assist Level: Supervision/Verbal cueing   Blood pressure (!) 180/76, pulse 87, temperature 98 F (36.7 C), temperature source Oral, resp. rate 15, height 5\' 7"  (1.702 m), weight 77.2 kg, SpO2 100 %.    Medical Problem List and Plan: 1. Functional deficits secondary to nondisplaced fracture right inferior pubic ramus as well as a displaced foreshortened fracture through the left femoral neck/intertrochanteric area.  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   -Weightbearing as tolerated with anterior total hip precautions.                                  -Conservative care right inferior pubic ramus fracture, weightbearing as tolerated             -patient may  shower             -ELOS/Goals: 22 days total, supervision to min assist goals  Continue CIR therapies including PT, OT  -3/5 communicated with ortho, found post-op therapy directions.   -0-30 degrees flexion weeks 3-4   -0-60 degrees weeks 5-6  HFU scheduled. 2.  Impaired mobility -DVT/anticoagulation:  Mechanical: Antiembolism stockings, thigh (TED hose) Bilateral lower extremities. Vascular ultrasound reviewed and negative for clot.  -should be able to begin lovenox today as permacath placed            -antiplatelet therapy: N/A 3. Femur fracture pain: conitnue Flector patch, Robaxin 750 mg 4 times daily, hydrocodone and oxycodone as needed             -added kpad to help with spasms  -schedule tylenol 1000mg  TID  Provided list of foods that can help with pain 4. Anxiety: Instructed on deep breathing  techniques. Provide emotional support.  Melatonin as needed. Neuropsych eval 3/6.              -antipsychotic agents: N/A 5. Neuropsych: This patient is capable of making decisions on her own behalf. 6. Skin/Wound Care: Routine skin checks 7. Fluids/Electrolytes/Nutrition: Routine in and outs with follow-up chemistries 8.  ESRD on HD.  Permacath exchanged 10/03/2021.    -  Follow-up hemodialysis per Dr. [name] -  Messaged therapy to see if we can practice transfers from inclined chair as she is nervous about this transfer during outpatient dialysis,  -HD later in day to allow participation in therapies during the day Appreciate nephro recs 9.  Acute on chronic anemia.  Has had transfusions already x 2. Hgb has stabilized in 7 range. continue iron supplement.              -epo per nephrology  Hemoglobin 8.7 on 2/24, appreciate nephro recs 10.  Diabetes mellitus with peripheral neuropathy. Placed order that she may use her Dexcom 6. Hemoglobin A1c 6.2.  SSI as prior to admission. Provide dietary education.  CBG (last 3)  Recent Labs    11/02/21 2109 11/03/21 0609 11/03/21 1126  GLUCAP 188* 129* 149*  Some lability will monitor. Iincrease Semglee to 19U, discussed plan to transition back to her novolog at home 11.  Hypertension. Decrease Cozaar to 75mg  to allow more fluid to be removed with HD Vitals:   11/03/21 0539 11/03/21 0800  BP: (!) 165/80 (!) 180/76  Pulse: 80 87  Resp: (!) 21 15  Temp: 97.9 F (36.6 C) 98 F (36.7 C)  SpO2: 100% 100%   Mildly elevated, avoid overtreatment due to dialysis 12.  Hyperparathyroidism of renal origin.  Plan outpatient parathyroidectomy per Dr.Lateef 13.  Overweight.  BMI 29.11-->26.35  Dietary follow-up. Provided list of foods that can help with weight loss. Discussed benefits of intermittent fasting.  14. Cushingoid?--outpt work up 28. Constipation: resolved. Decrease colace to daily prn. D/c senna. Discussed benefits of high fiber foods. Continue  bowel regimen. D/c miralax.  16. Right quadriceps tear: surgery complete with good healing on XR, minimal postop pain, discussed importance of participating in 3 hours therapy daily as much as she can tolerate and she is very motivated and excited for rehab, remove dressing today  -ROM as above 17. Gas: continue simethicone prn 18. Ileus: resolved, regular diet resumed -tolerating.  19. Abd spasms: resolved 20. Black tarry stools: discussed heme occult negative last night. Hgb reviewed and stable.  21. Vitamin D deficiency: continue ergocalciferol 50,000U once per week for 7 weeks. Discussed that this dose is higher than a daily supplement.  22. Left heel pain and. Left foot pain over metatarsals-  3/5 MRI was reviewed with patient. There is associated atrophy of the forefoot muscles likely associated with her diabetes but no signs of fx, inflammation, etc.   -recommend appropriate shoewear to support arch and metatarsals  -added voltaren gel to help with pain control.  -stretching of arch/heel cord with therapies     LOS: 17 days A FACE TO FACE EVALUATION WAS PERFORMED  Martha Clan P Janely Gullickson 11/03/2021, 12:28 PM

## 2021-11-03 NOTE — Progress Notes (Signed)
Occupational Therapy Session Note ? ?Patient Details  ?Name: [name] ?MRN: [id number] ?Date of Birth: [birth year] ? ?Today's Date: 11/03/2021 ?OT Individual Time: 7998-7215 ?OT Individual Time Calculation (min): 45 min  ? ? ?Short Term Goals: ?Week 2:  OT Short Term Goal 1 (Week 2): Pt will complete BSC transfer with 1 assist using LRAD to promote OOB toileting ?OT Short Term Goal 1 - Progress (Week 2): Met ?OT Short Term Goal 2 (Week 2): Pt will complete 2/3 components of toileting with supervision ?OT Short Term Goal 2 - Progress (Week 2): Progressing toward goal ?Week 3:  OT Short Term Goal 1 (Week 3): STG = LTG due to ELOS ? ?Skilled Therapeutic Interventions/Progress Updates:  ?  Pt greeted at time of session sitting up in wheelchair no pain at rest, pt hyperverbal and needing redirecting throughout session. Pt initially stating she already completed ADL tasks this AM and wanted to work on RLE ROM and LLE strengthening instead. Pt stating her RLE restrictions have been changed to allow RLE knee flexion to 30*, confirmed via PT noted and MD. Pt initially venting about her medical experiences and emotional support provided. MD entering at this time as well for morning rounds and once completed, resumed session and performed heel slides with AAROM for RLE up to 30* knee flexion, 2x10. Pt performing slide board wheelchair > bed with Supervision with therapist holding wheelchair steady. Set up call bell in reach all needs met.  ? ?Therapy Documentation ?Precautions:  ?Precautions ?Precautions: Fall, Anterior Hip, Other (comment) ?Precaution Comments: HD access port RUQ, L LE anterior hip precautions, R LE rehab protocol in pt's paper chart ?Required Braces or Orthoses: Knee Immobilizer - Right ?Knee Immobilizer - Right: On at all times, Other (comment) ?Restrictions ?Weight Bearing Restrictions: Yes ?RLE Weight Bearing: Touchdown weight bearing ?LLE Weight Bearing: Weight bearing as tolerated ?Other  Position/Activity Restrictions: L LE anterior hip precautions - no hip extension, hip external rotation, or abduction ? ? ? ?Therapy/Group: Individual Therapy ? ?Viona Gilmore ?11/03/2021, 7:27 AM ?

## 2021-11-03 NOTE — Progress Notes (Signed)
Physical Therapy Session Note ? ?Patient Details  ?Name: [name] ?MRN: [id number] ?Date of Birth: 1965/10/11 ? ?Today's Date: 11/03/2021 ?PT Individual Time: 1282-0813 ?PT Individual Time Calculation (min): 53 min  ? ?Short Term Goals: ?Week 2:  PT Short Term Goal 1 (Week 2): pt will transfer sit<>supine with LRAD and min A consistantly ?PT Short Term Goal 1 - Progress (Week 2): Met ?PT Short Term Goal 2 (Week 2): pt will perform transfers with LRAD and min A consistantly ?PT Short Term Goal 2 - Progress (Week 2): Met ?PT Short Term Goal 3 (Week 2): pt will initiate gait training ?PT Short Term Goal 3 - Progress (Week 2): Not met ?Week 3:  PT Short Term Goal 1 (Week 3): STG=LTG due to LOS ? ?Skilled Therapeutic Interventions/Progress Updates:  ? Updated Rehab Protocol: Weeks 3-4: pt is now PWB on RLE with brace locked in extension for ambulation (but currently unable to ambulate); unlock 0-30 degrees for PT to perform PROM and heel slides, ankle pumps, seated gastroc stretching - lock back in extension when done with PT ? ?Received pt sitting in WC, pt agreeable to PT treatment, and did not state pain level during session. Session with emphasis on functional mobility/transfers, generalized strengthening and endurance, and simulated car transfers. Pt performed WC mobility 17f using BUEs and supervision but stopped due to UE fatigue and transported remainder of way to ortho gym. Pt performed simulated car transfer via slideboard with supervision overall but did require assistance to stabilize WC and for placing/removing slideboard. Pt required significantly increased time with transfer due to fatigue, weakness, pain in LLE, and deconditioning. Pt also required cues for technique as pt initially trying to swing legs around - but then realized she could not due to Bledsoe brace being locked in extension. Suggested pt sit in backseat of her car and scoot back onto seat in long sitting - pt practiced this technique and  reported greater ease but still requesting to try to sit in front seat when we practice actual car transfer with her husband tomorrow. Informed pt that she will need to scoot seat and recline all the way back but will further assess tomorrow. [facility] and performed seated LLE strengthening on [place] at 30 cm/sec for 1 minute x 4 trials with pt using 5lb dowel to provide counter resistance - emphasis on glute/quad strengthening. Pt performed WC mobility additional 1573fusing BUE and supervision back to room. [facility] needs within reach.  ? ?Therapy Documentation ?Precautions:  ?Precautions ?Precautions: Fall, Anterior Hip, Other (comment) ?Precaution Comments: HD access port RUQ, L LE anterior hip precautions, R LE rehab protocol in pt's paper chart ?Required Braces or Orthoses: Knee Immobilizer - Right ?Knee Immobilizer - Right: On at all times, Other (comment) ?Restrictions ?Weight Bearing Restrictions: Yes ?RLE Weight Bearing: Partial weight bearing ?LLE Weight Bearing: Weight bearing as tolerated ?Other Position/Activity Restrictions: L LE anterior hip precautions - no hip extension, hip external rotation, or abduction ? ?Therapy/Group: Individual Therapy ?[name], DPT  ? ?11/03/2021, 7:46 AM  ?

## 2021-11-03 NOTE — Progress Notes (Signed)
Patient ID: [name], female   DOB: 1966/01/08, 56 y.o.   MRN: [id number] ? ?Chiropractor Ordered through adapt ?

## 2021-11-03 NOTE — Procedures (Signed)
? ?  I was present at this dialysis session, have reviewed the session itself and made  appropriate changes ?Rob Muhammad Vacca MD ?[facility] ?pager [phone]   ?11/03/2021, 3:48 PM ? ? ?

## 2021-11-03 NOTE — Progress Notes (Signed)
Occupational Therapy Session Note ? ?Patient Details  ?Name: [name] ?MRN: [id number] ?Date of Birth: 04/15/1966 ? ?Today's Date: 11/03/2021 ?OT Individual Time: 0131-4388 ?OT Individual Time Calculation (min): 54 min  ? ? ?Short Term Goals: ?Week 3:  OT Short Term Goal 1 (Week 3): STG = LTG due to ELOS ? ?Skilled Therapeutic Interventions/Progress Updates:  ?Skilled OT intervention completed with focus on self-care, functional transfers, toileting education. Pt received upright in bed, agreeable to session. Pt completed bed mobility with supervision, then supervision slideboard transfer to w/c (with supervision for board placement this session). Therapist only provided CGA to w/c for safety. Seated at sink, pt completed UB bathing, hair/facial grooming and styling with mod I. Discussed with pt the importance of attempting OOB toileting this week as she preps for d/c, not just simulations/transfers, to offer therapist opportunities to provide education with the real task especially since pt plans to complete toileting on [facility] at home. Pt expressed that the "issue is I have toots early in the AM and then there's no time to transfer because I'm already going." Educated pt on timed toileting strategy, however pt not very interested.  ? ?Pt had questions about her "psychotherapy" (neuropsych), with therapist educating pt on purpose of the eval as not to determine if pt is "crazy" but to assist pt with coping regarding her medical history and recent events the past few weeks as well as emotional support for having her independence modified and impacted from her baseline.  ? ?Education provided to pt about use of LH shoe horn for donning shoe as pt demonstrated difficulty with this task. With cues and demonstration, assist fading from Max A to supervision. Re-educated pt on purchasing AE such as reacher and [place] shoe horn for maximizing pt's dressing independence. Pt was was left seated in w/c, with chair alarm on and all  needs in reach at end of session. ? ? ?Therapy Documentation ?Precautions:  ?Precautions ?Precautions: Fall, Anterior Hip, Other (comment) ?Precaution Comments: HD access port RUQ, L LE anterior hip precautions, R LE rehab protocol in pt's paper chart ?Required Braces or Orthoses: Knee Immobilizer - Right ?Knee Immobilizer - Right: On at all times, Other (comment) ?Restrictions ?Weight Bearing Restrictions: Yes ?RLE Weight Bearing: Touchdown weight bearing ?LLE Weight Bearing: Weight bearing as tolerated ?Other Position/Activity Restrictions: L LE anterior hip precautions - no hip extension, hip external rotation, or abduction ? ?Pain: ?No c/o pain ? ? ?Therapy/Group: Individual Therapy ? ?[name] ?11/03/2021, 7:24 AM ?

## 2021-11-04 LAB — GLUCOSE, CAPILLARY
Glucose-Capillary: 117 mg/dL — ABNORMAL HIGH (ref 70–99)
Glucose-Capillary: 123 mg/dL — ABNORMAL HIGH (ref 70–99)
Glucose-Capillary: 140 mg/dL — ABNORMAL HIGH (ref 70–99)
Glucose-Capillary: 141 mg/dL — ABNORMAL HIGH (ref 70–99)

## 2021-11-04 LAB — HEPATITIS B SURFACE ANTIBODY, QUANTITATIVE: Hep B S AB Quant (Post): <3.1 m[IU]/mL — ABNORMAL LOW (ref >9.9)

## 2021-11-04 MED ORDER — INSULIN GLARGINE-YFGN 100 UNIT/ML ~~LOC~~ SOLN
20.0000 [IU] | Freq: Every day | SUBCUTANEOUS | Status: DC
  Administered 2021-11-04 – 2021-11-07 (×4): 20 [IU] via SUBCUTANEOUS
  Filled 2021-11-04 (×6): qty 0.2

## 2021-11-04 NOTE — Progress Notes (Signed)
Patient ID: [name], female   DOB: 06/20/1966, 56 y.o.   MRN: [id number] ? ?Patient [facility] referral sent to [facility] ?

## 2021-11-04 NOTE — Progress Notes (Signed)
Occupational Therapy Session Note ? ?Patient Details  ?Name: [name] ?MRN: [id number] ?Date of Birth: December 09, 1965 ? ?Today's Date: 11/04/2021 ?OT Individual Time: 1007-1030 & [id number] ?OT Individual Time Calculation (min): 23 min & 56 min ? ? ?Short Term Goals: ?Week 3:  OT Short Term Goal 1 (Week 3): STG = LTG due to ELOS ? ?Skilled Therapeutic Interventions/Progress Updates:  ?Session 1 ?Skilled OT intervention completed with focus on self-care, functional transfers. Pt received upright in bed. Pt denied pain this AM. Completed supervised bed mobility, slideboard placement, and slideboard transfer from EOB to w/c. Seated in w/c, pt completed facial grooming/cosmetics with set up A. Pt was left seated at sink to continue seated self-care tasks, with chair alarm on and all needs in reach at end of session. ? ?Session 2 ?Skilled OT intervention completed with focus on family education regarding toilet and recliner transfers, toileting management, with pt's husband present. Pt received upright in bed, denied pain. Education provided to pt's husband about BSC transfer via lateral scoot/slideboard, with pt and pt's husband able to return demonstrate transfer at the supervision level from EOB <> BSC. Educated both on toileting management including using brief for threading to minimize leans, parts of toileting that pt might need min A with due to her not declining to work on through therapy with therapist including posterior pericare while on BSC. Pt with questions about how to transfer into recliner due to the arm rests not going down, with this therapist educating pt and spouse about proper set up of w/c positioning for slideboard transfers as well as safety considerations with this new transfer. Pt's spouse able to return demonstrate supervision for level sliding, and up to min A for boosting pt's hips up the slight incline while going from recliner <> w/c, with good body mechanics after cues provided. Education  provided about AE (reacher, shoe horn) that pt could purchase to maximize independence as well as encouraging pt to complete as much as possible once d/c as to not regress but to keep moving forward vs having her husband take over once home. Informed pt to having staff present if she wants to transfer back to bed vs husband transferring her. Pt and husband notified nursing, about going with grounds pass to [place]era. All immediate needs met at end of session. ? ? ?Therapy Documentation ?Precautions:  ?Precautions ?Precautions: Fall, Anterior Hip, Other (comment) ?Precaution Comments: HD access port RUQ, L LE anterior hip precautions, R LE rehab protocol in pt's paper chart ?Required Braces or Orthoses: Knee Immobilizer - Right ?Knee Immobilizer - Right: On at all times, Other (comment) ?Restrictions ?Weight Bearing Restrictions: Yes ?RLE Weight Bearing: Partial weight bearing ?LLE Weight Bearing: Weight bearing as tolerated ?Other Position/Activity Restrictions: L LE anterior hip precautions - no hip extension, hip external rotation, or abduction ? ? ?Therapy/Group: Individual Therapy ? ?Eugine Bubb E Christiaan Strebeck ?11/04/2021, 7:44 AM ?

## 2021-11-04 NOTE — Progress Notes (Signed)
Physical Therapy Session Note ? ?Patient Details  ?Name: [name] ?MRN: [id number] ?Date of Birth: 11-Jul-1966 ? ?Today's Date: 11/04/2021 ?PT Individual Time: 9983-3825 ?PT Individual Time Calculation (min): 86 min  ? ?Short Term Goals: ?Week 2:  PT Short Term Goal 1 (Week 2): pt will transfer sit<>supine with LRAD and min A consistantly ?PT Short Term Goal 1 - Progress (Week 2): Met ?PT Short Term Goal 2 (Week 2): pt will perform transfers with LRAD and min A consistantly ?PT Short Term Goal 2 - Progress (Week 2): Met ?PT Short Term Goal 3 (Week 2): pt will initiate gait training ?PT Short Term Goal 3 - Progress (Week 2): Not met ?Week 3:  PT Short Term Goal 1 (Week 3): STG=LTG due to LOS ? ?Skilled Therapeutic Interventions/Progress Updates:  ? Received pt sitting in WC. Husband, Aaron Edelman, present for family education training. Pt denied any pain during session, but reported increased "soreness" in RLE throughout session - RN present at end of session to administer medication. Session with emphasis on family education, functional mobility/transfers, generalized strengthening and endurance, and actual car transfer. Pt transported to entrance of [place] in [place] dependently for time management and energy conservation purposes. Pt performed car transfer from sedan height using slideboard and min A to enter car (with assist from [place] to manage LEs) but able to transfer out of car with supervision. Pt used personal slideboard purchased from [employer] with beads along middle to make sliding easier - pt demonstrating good adherence to RLE PWB precautions and able to direct care to husband independently. Educated Brian on [employer] parts management including how to fold up [place] and how to don/doff legrests. Pt transported back to 28M in WC dependently and performed WC mobility 125f using BUE and supervision/mod I back to room. Pt able to set up transfer back to bed, place slideboard, and transfer WC<>bed with supervision. Sit<>supine Mod I  using BUE to assist RLE into bed. Nephrology MD present for brief assessment. Educated pt on how to lock/unlock Bledsoe brace and reminded pt of importance of R knee being locked in extension at all times unless with PT. Unlocked R Bledsoe brace to 30 degrees and worked on ASunGardinto R knee flexion up to 30 degrees: ?Trial 1: x10 reps from 26 degrees to 30 degrees ?Trial 2: x6 reps from 28 degrees to 30 degrees - then performed passive sustained knee flexion stretch at 30 degrees, supported on pillows for 5 minutes ?Pt then performed the following exercises with emphasis on LE strength/ROM: ?-AAROM L heel slides x10 ?-AAROM L piriformis stretch 5x10 second hold ?-hip abduction (small range) with red TB 2x15 ?-PF with red TB 2x20 on LLE  ?-DF with red TB 2x20 on LLE ?Concluded session with pt semi-reclined in bed with all needs within reach awaiting upcoming OT session. Provided pt with ice pack for R knee.  ? ?Therapy Documentation ?Precautions:  ?Precautions ?Precautions: Fall, Anterior Hip, Other (comment) ?Precaution Comments: HD access port RUQ, L LE anterior hip precautions, R LE rehab protocol in pt's paper chart ?Required Braces or Orthoses: Knee Immobilizer - Right ?Knee Immobilizer - Right: On at all times, Other (comment) ?Restrictions ?Weight Bearing Restrictions: Yes ?RLE Weight Bearing: Partial weight bearing ?LLE Weight Bearing: Weight bearing as tolerated ?Other Position/Activity Restrictions: L LE anterior hip precautions - no hip extension, hip external rotation, or abduction ? ?Therapy/Group: Individual Therapy ?ABlenda Nicely?ABecky SaxPT, DPT  ? ?11/04/2021, 7:40 AM  ?

## 2021-11-04 NOTE — Progress Notes (Signed)
Patient ID: [name], female   DOB: 1966/08/10, 56 y.o.   MRN: [id number] ? ?Patient accepted by [facility] ?

## 2021-11-04 NOTE — Discharge Summary (Signed)
Physician Discharge Summary  Patient ID: [name] MRN: [id number] DOB/AGE: 58-Sep-1967 58 y.o.  Admit date: 10/17/2021 Discharge date: 11/08/2021  Discharge Diagnoses:  Principal Problem:   Intertrochanteric fracture of left hip ([place]) Active Problems:   ESRD on hemodialysis ([place])   Acute on chronic anemia   Diabetic peripheral neuropathy (HCC) Right quadricep tear DVT prophylaxis Anxiety Acute on chronic anemia Hypertension Hyperparathyroidism Obesity Constipation Ileus-resolved Right inferior pubic ramus fracture  Discharged Condition: Stable  Significant Diagnostic Studies: DG Abd 1 View  Result Date: 10/13/2021 CLINICAL DATA:  Ileus EXAM: ABDOMEN - 1 VIEW COMPARISON:  10/13/2011 FINDINGS: Vascular catheter tip over the low right atrium. Nonobstructed gas pattern with mild stool. Left hip replacement. IMPRESSION: Nonobstructed gas pattern Electronically Signed   By: Donavan Foil M.D.   On: 10/13/2021 15:19   DG Abd 1 View  Result Date: 10/11/2021 CLINICAL DATA:  Abdominal spasm. Black tarry stools and constipation after hip surgery. Evaluate for ileus or constipation. EXAM: ABDOMEN - 1 VIEW COMPARISON:  06/09/2019 FINDINGS: There is mild gaseous distension of the large and small bowel loops. A moderate stool burden is also identified within the colon. No signs of bowel obstruction. IMPRESSION: Mild gaseous distension of the large and small bowel loops, likely reflecting mild ileus. Electronically Signed   By: Kerby Moors M.D.   On: 10/11/2021 11:02   MR FOOT LEFT WO CONTRAST  Result Date: 10/31/2021 CLINICAL DATA:  Left foot pain. EXAM: MRI OF THE LEFT FOOT WITHOUT CONTRAST TECHNIQUE: Multiplanar, multisequence MR imaging of the left forefoot was performed. No intravenous contrast was administered. COMPARISON:  Left foot x-rays from same day. FINDINGS: Bones/Joint/Cartilage No suspicious marrow signal abnormality. No fracture or dislocation. Mild second TMT joint  osteoarthritis. No joint effusion. Ligaments Collateral ligaments are intact. Muscles and Tendons Flexor and extensor tendons are intact. Increased T2 signal within and atrophy of the intrinsic muscles of the forefoot, nonspecific, but likely related to diabetic muscle changes. Soft tissue Dorsal forefoot soft tissue swelling. No fluid collection or hematoma. No soft tissue mass. IMPRESSION: 1. No acute abnormality. Specifically, no fracture of the fourth proximal phalanx. Electronically Signed   By: Titus Dubin M.D.   On: 10/31/2021 14:38   MR KNEE RIGHT WO CONTRAST  Result Date: 10/09/2021 CLINICAL DATA:  Right knee pain after fall.  Abnormal x-ray EXAM: MRI OF THE RIGHT KNEE WITHOUT CONTRAST TECHNIQUE: Multiplanar, multisequence MR imaging of the knee was performed. No intravenous contrast was administered. COMPARISON:  X-ray 10/07/2021 FINDINGS: Technical Note: Despite efforts by the technologist and patient, motion artifact is present on today's exam and could not be eliminated. This reduces exam sensitivity and specificity. MENISCI Medial meniscus:  Intact. Lateral meniscus:  Intact. LIGAMENTS Cruciates: Intact ACL and PCL. Collaterals: Intact MCL. Lateral collateral ligament complex intact. CARTILAGE Patellofemoral: Irregular full-thickness cartilage loss involving the superior aspect of the lateral and medial patellar facets, which may be degenerative or posttraumatic. High-grade near-full thickness cartilage defect within the central trochlear groove measuring approximately 11 x 5 mm. Medial: Mild diffuse chondral thinning of the weight-bearing medial compartment. Lateral:  No chondral defect. MISCELLANEOUS Joint: Large complex knee joint effusion. Thickened, edematous appearance of the suprapatellar fat pad. Popliteal Fossa:  No Baker's cyst. Intact popliteus tendon. Extensor Mechanism: High-grade, near full-thickness tear of the distal quadriceps tendon with less than 1 cm of retraction. The  superficial most aspect of the distal tendon remains intact. Low signal cortical avulsion fragments are also avulsed from the superior cortex of the  patella (series 12, image 17). Laxity of the patellar tendon without tear. Bones: Cortical avulsion of the superior pole of the patella. Patella baja alignment. Patchy marrow edema within the distal femur and proximal tibia are nonspecific and could be posttraumatic or related to osteopenia. Previously described area of relative decreased density in the proximal tibial metaphysis is consistent with fatty bone marrow. Similar findings are seen within the distal femoral metaphysis (series 9, image 17). No suspicious marrow replacing bone lesion. Other: Diffuse soft tissue swelling and edema with small amount of ill-defined hemorrhage along the anterolateral aspect of the knee. IMPRESSION: 1. High-grade, near full-thickness mildly retracted tear of the distal quadriceps tendon with associated cortical avulsion of the superior pole of the patella. 2. Large complex knee joint effusion. 3. Patchy marrow edema within the distal femur and proximal tibia are nonspecific and could be posttraumatic or related to osteopenia. 4. Previously described area of decreased density in the proximal tibial metaphysis is consistent with fatty bone marrow. No suspicious marrow replacing bone lesion. 5. 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 6. Intact menisci.  Intact cruciate and collateral ligaments. Electronically Signed   By: Davina Poke D.O.   On: 10/09/2021 08:18   DG Knee Right Port  Result Date: 10/07/2021 CLINICAL DATA:  Trauma, fall, pain EXAM: PORTABLE RIGHT KNEE - 1-2 VIEW COMPARISON:  None. FINDINGS: No recent fracture or dislocation is seen. There is small to moderate effusion in the suprapatellar bursa. Erosive change seen in the posterior margin of patella may be due to degenerative arthritis.  There are faint calcifications in the quadriceps tendon close to patella, possibly calcific tendinosis or bursitis. Osteopenia is seen in bony structures. In the AP view, there is 4.2 x 3.1 cm area of subtle decreased density with possible disruption of trabeculae in the proximal shaft of tibia. There is no break in the cortical margins. IMPRESSION: No recent fracture or dislocation is seen. Small to moderate effusion is present in the suprapatellar bursa. There is ill-defined 4.2 x 3.1 cm area of decreased density with possible disruption of trabeculae in the central portion of proximal shaft of tibia. This may be due to osteopenia or suggest a space-occupying lesion. Comparison with previous studies or follow-up CT or MRI may be considered. Electronically Signed   By: Elmer Picker M.D.   On: 10/07/2021 15:20   DG Abd Portable 1V  Result Date: 10/12/2021 CLINICAL DATA:  Constipation. EXAM: PORTABLE ABDOMEN - 1 VIEW COMPARISON:  10/11/2021 FINDINGS: 1121 hours. Interval decrease in gas and stool within the colon. Mild central small bowel distension is similar to prior. Bones are diffusely demineralized. IMPRESSION: Interval decrease in stool volume in the colon. Electronically Signed   By: Misty Stanley M.D.   On: 10/12/2021 14:09   DG Foot Complete Left  Result Date: 10/31/2021 CLINICAL DATA:  Beginning to walk after knee surgery and felt pain and swelling in left foot. EXAM: LEFT FOOT - COMPLETE 3+ VIEW COMPARISON:  None. FINDINGS: There is diffuse decreased bone mineralization. Mild joint space narrowing of the interphalangeal joints diffusely. Mild dorsal tarsometatarsal degenerative osteophytes seen on lateral view. Small plantar calcaneal heel spur. Mild chronic spurring at the Achilles insertion on the calcaneus. Mild posterior distal tibial plafond degenerative osteophytosis. Vascular calcifications are noted. Mild dorsal forefoot soft tissue swelling. Within the limitations of decreased bone  mineralization, no definite acute fracture is visualized, however it is difficult to exclude a possible nondisplaced fracture seen on oblique  view only within the proximal metaphysis of the proximal phalanx of fourth toe given overlapping bones versus minimal cortical step-off medially. No dislocation. IMPRESSION: Diffuse decreased bone mineralization limits evaluation for an acute fracture. There is soft tissue swelling of the dorsal aspect of the forefoot. No definitive fracture line is seen, however on oblique view there is question of minimal lucency and cortical step-off that may represent normal overlap of bones versus a tiny nondisplaced fracture. Recommend clinical correlation for point tenderness. Electronically Signed   By: Yvonne Kendall M.D.   On: 10/31/2021 10:35   DG C-Arm 1-60 Min-No Report  Result Date: 10/16/2021 Fluoroscopy was utilized by the requesting physician.  No radiographic interpretation.   DG HIP UNILAT WITH PELVIS 2-3 VIEWS LEFT  Result Date: 10/16/2021 CLINICAL DATA:  Left hip  REPAIR QUADRICEP TENDON EXAM: DG HIP (WITH OR WITHOUT PELVIS) 2-3V LEFT COMPARISON:  X-ray left hip 10/02/2021 FINDINGS: Intraoperative repair of the quadriceps tendon per history. One low resolution intraoperative spot views of the left hip were obtained in a patient status post total left hip arthroplasty. No fracture visible on the limited views. Total fluoroscopy time: 10.3 seconds Total radiation dose: 1.8 mGy IMPRESSION: Intraoperative repair of the quadriceps tendon in a patient status post total left hip arthroplasty. Electronically Signed   By: Iven Finn M.D.   On: 10/16/2021 17:12   VAS [place] LOWER EXTREMITY VENOUS (DVT)  Result Date: 10/19/2021  Lower Venous DVT Study Patient Name:  YURIDIA COUTS  Date of Exam:   10/19/2021 Medical Rec #: [id number]     Accession #:    [id number] Date of Birth: 1966-01-04     Patient Gender: F Patient Age:   53 years Exam [facility]  [facility]  Procedure:      VAS [place] LOWER EXTREMITY VENOUS (DVT) Referring Phys: Lauraine Rinne --------------------------------------------------------------------------------  Indications: Edema.  Limitations: Extensive right lower extremity bandaging and immobilizer. Comparison Study: No prior study Performing Technologist: Maudry Mayhew MHA, RDMS, RVT, RDCS  Examination Guidelines: A complete evaluation includes B-mode imaging, spectral Doppler, color Doppler, and power Doppler as needed of all accessible portions of each vessel. Bilateral testing is considered an integral part of a complete examination. Limited examinations for reoccurring indications may be performed as noted. The reflux portion of the exam is performed with the patient in reverse Trendelenburg.  Right Technical Findings: Not visualized segments include CFV.  +---------+---------------+---------+-----------+----------+--------------+  LEFT      Compressibility Phasicity Spontaneity Properties Thrombus Aging  +---------+---------------+---------+-----------+----------+--------------+  CFV       Full            Yes       Yes                                    +---------+---------------+---------+-----------+----------+--------------+  SFJ       Full                                                             +---------+---------------+---------+-----------+----------+--------------+  FV Prox   Full                                                             +---------+---------------+---------+-----------+----------+--------------+  FV Mid    Full                                                             +---------+---------------+---------+-----------+----------+--------------+  FV Distal Full                                                             +---------+---------------+---------+-----------+----------+--------------+  PFV       Full                                                              +---------+---------------+---------+-----------+----------+--------------+  POP       Full            Yes       Yes                                    +---------+---------------+---------+-----------+----------+--------------+  PTV       Full                                                             +---------+---------------+---------+-----------+----------+--------------+  PERO      Full                                                             +---------+---------------+---------+-----------+----------+--------------+     Summary: LEFT: - There is no evidence of deep vein thrombosis in the lower extremity.  - No cystic structure found in the popliteal fossa.  *See table(s) above for measurements and observations. Electronically signed by Orlie Pollen on 10/19/2021 at 11:35:36 AM.    Final     Labs:  Basic Metabolic Panel: Recent Labs  Lab 10/31/21 0550 11/03/21 1332 11/05/21 1338  NA  --  133* 140  K  --  5.7* 5.3*  CL  --  95* 100  CO2  --  22 25  GLUCOSE  --  127* 172*  BUN  --  51* 45*  CREATININE 5.22* 6.97* 5.70*  CALCIUM  --  9.3 9.2  PHOS  --  5.2* 5.4*    CBC: Recent Labs  Lab 11/03/21 1331 11/05/21 1200  WBC 6.1 6.0  NEUTROABS 4.6  --   HGB 9.1* 9.2*  HCT 28.9* 28.0*  MCV 103.2* 102.9*  PLT 222 221    CBG: Recent Labs  Lab 11/04/21 2111 11/05/21 0624 11/05/21 1215 11/05/21 1653 11/05/21 2243  GLUCAP 141* 124* 84 157* 128*  Brief HPI:   [name] is a 56 y.o. right-handed female with history of end-stage renal disease with hemodialysis via right IJ diabetes mellitus with neuropathy acute on chronic anemia hyperlipidemia obesity with BMI 27.76, secondary hyperparathyroidism of renal origin and currently awaiting outpatient scheduling for evaluation of parathyroidectomy, hypertension.  Per chart review lives with spouse.  Modified independent prior to admission.  [facility]ed to [facility]/2021 with complaints of severe left hip pain.  Patient states her leg  gave out and she heard a pop in her hip.  She also had complaints of right side hip pain from a fall.  X-rays and imaging revealed displaced foreshortened fracture of the neck and intertrochanteric left femur as well as acute nondisplaced fracture of the right inferior pubic ramus.  Patient underwent left hip anterior hip hemiarthroplasty 09/29/2021 per Dr. Kurtis Bushman.  Conservative care of right inferior pubic ramus fracture.  Patient currently weightbearing as tolerated left lower extremity as well as weightbearing as tolerated right lower extremity.  Hospital course hemodialysis vascular surgery Dr. Rica Koyanagi consulted to evaluate for exchange of permacath from right IJ to better dialysis 10/03/2021.  Hospital course anemia latest hemoglobin 6.7 transfused follow-up hemoglobin 7.7.  Therapy evaluations completed she was admitted to inpatient rehab services 10/06/2021 for ongoing rehab therapies.   Hospital Course: [name] was admitted to rehab 10/17/2021 for inpatient therapies to consist of PT, ST and OT at least three hours five days a week. Past admission physiatrist, therapy team and rehab RN have worked together to provide customized collaborative inpatient rehab.  Patient with slow progressive gains in regards to nondisplaced fracture right inferior pubic ramus as well as displaced foreshortened fracture through the left femoral neck intertrochanteric area.  Status post anterior hip left hemiarthroplasty 09/29/2021.  She would follow Dr. Harlow Mares at [facility].  Weightbearing as tolerated with anterior hip precautions.  She did have a slight anemia and yellow base to the left hip incision she was afebrile with no leukocytosis and was placed on Keflex.  SCDs for DVT prophylaxis.  Negative vascular study.  Maintain on Lovenox for DVT prophylaxis.  Hospital rehab course complicated by right quadricep tendon rupture with orthopedic services Dr. Mable Fill consulted with repair 10/16/2021.  Patient was placed in a  brace postoperative therapy directions 0 to 30 degrees flexion weeks 3 and 4 and 0 to 60 degrees weeks 5 and 6.  Pain managed with use of scheduled Voltaren gel as well as hydrocodone as needed.  She was using Robaxin for muscle spasms.  Blood sugars overall controlled insulin therapy as directed.  Blood pressures managed with Cozaar.  Chronic anemia follow-up CBC with hemodialysis maintained on Aranesp.  Patient had undergone permacath exchange 10/03/2021 to better serve hemodialysis.  Noted history of hyperparathyroidism of renal origin plan outpatient parathyroidectomy per Dr.Lateef.  Bouts of constipation resolved with laxative assistance.   Blood pressures were monitored on TID basis and soft and monitored  Diabetes has been monitored with ac/hs CBG checks and SSI was use prn for tighter BS control.    Rehab course: During patient's stay in rehab weekly team conferences were held to monitor patient's progress, set goals and discuss barriers to discharge. At admission, patient required mod max assist 3 feet rolling walker  Physical exam.  Blood pressure 145/66 pulse 101 temperature 98.3 respirations 20 oxygen saturations 98% Constitutional.  No acute distress HEENT Head.  Normocephalic and atraumatic Eyes.  Pupils round and reactive to light no discharge without nystagmus Neck.  Supple  nontender no JVD without thyromegaly Cardiac regular rate rhythm any extra sounds or murmur heard Abdomen.  Soft nontender positive bowel sounds without rebound Respiratory effort normal no respiratory distress without wheeze Musculoskeletal. Right lower extremity edema present as well as 1+ left lower extremity. Skin.  Incision site clean and dry Neurologic.  Alert oriented mildly anxious.  Normal insight and awareness.  Cranial nerve exam unremarkable.  Upper extremity motor 5/5.  Lower extremity motor limited proximally by pain ADF/plantarflexion 4-5/5 no sensory findings.  He/She  has had improvement in  activity tolerance, balance, postural control as well as ability to compensate for deficits. He/She has had improvement in functional use RUE/LUE  and RLE/LLE as well as improvement in awareness.  Updated rehab protocol week 3-4 patient touchdown weightbearing right lower extremity with brace locked in extension for ambulation unlocked 0-30 degrees from physical therapy to perform passive range of motion.  Weightbearing as tolerated left lower extremity with anterior hip precautions.  Propels wheelchair supervision.  Required significantly increased time with transfer due to some fatigue with rest breaks required.  Suggested patient sit in the backseat of car and scoot back onto seat and long sitting with brace in place.  She did need assist for lower body ADLs.  Family teaching completed plan discharged to home       Disposition: Discharge to home    Diet: Renal diet 1200 mL fluid restriction  Special Instructions: No driving smoking or alcohol   Weightbearing as tolerated left lower extremity anterior hip precautions.  Touchdown weightbearing right lower extremity.  Follow-up outpatient Dr.Lateef to coordinate parathyroidectomy  Continue hemodialysis as directed.  Medications at discharge. 1.  Tylenol as needed 2.  Aranesp weekly with hemodialysis 3.  Voltaren gel 2 g 3 times daily 4.  Hydrocodone 1-2 tabs every 4 hours as needed pain 5.  Lantus 20  units daily 6.  Cozaar 75 mg p.o. daily 7.  Robaxin 750 mg every 8 hours as needed muscle spasms 8.  Vitamin D 50,000 units every 7 days 9.Keflex 500 mg every 12 hours x three days and stop     30-35 minutes were spent completing discharge summary and discharge planning  Discharge Instructions     Ambulatory referral to Physical Medicine Rehab   Complete by: As directed    Moderate complexity left femoral neck fracture as well as right quadricep tendon tear        Follow-up Information     Raulkar, Clide Deutscher, MD Follow up.    Specialty: Physical Medicine and Rehabilitation Why: 5/16 1:40pm Contact information: [address] [place] [address] [phone]         Georgeanna Harrison, MD Follow up.   Specialty: Orthopedic Surgery Why: Call for appointment one week Contact information: [place] [address] [phone]         Lovell Sheehan, MD Follow up.   Specialty: Orthopedic Surgery Why: Call for appointment Contact information: [place] [address] [phone]         Liana Gerold, MD Follow up.   Specialty: Nephrology Why: Call for appointment Contact information: [address] [place] [address] [phone]         Dialysis, [facility]. Go on 11/10/2021.   Why: resume Monday/Wednesday/Friday schedule- arrive at 10:30 for 10:45 chair time. Contact information: [place] [address] [phone]  Signed: Lavon Paganini Iza Preston 11/06/2021, 5:19 AM

## 2021-11-04 NOTE — Progress Notes (Signed)
PROGRESS NOTE   Subjective/Complaints: No complaints this morning Feeling good with 30 degrees knee flexion Excited for therapy plan today Using her essential oils  ROS: Patient denies fever, rash, sore throat, blurred vision, dizziness, nausea, vomiting, diarrhea, cough, shortness of breath or chest pain, headache, or mood change.   Objective:   No results found. Recent Labs    11/03/21 1331  WBC 6.1  HGB 9.1*  HCT 28.9*  PLT 222        Recent Labs    11/03/21 1332  NA 133*  K 5.7*  CL 95*  CO2 22  GLUCOSE 127*  BUN 51*  CREATININE 6.97*  CALCIUM 9.3       Intake/Output Summary (Last 24 hours) at 11/04/2021 1006 Last data filed at 11/04/2021 6270 Gross per 24 hour  Intake 714 ml  Output 1500 ml  Net -786 ml         Physical Exam: Vital Signs Blood pressure (!) 157/73, pulse 93, temperature 98.8 F (37.1 C), resp. rate 20, height 5\' 7"  (1.702 m), weight 77.6 kg, SpO2 99 %. Gen: no distress, normal appearing HEENT: oral mucosa pink and moist, NCAT Cardio: Reg rate Chest: normal effort, normal rate of breathing Abd: soft, non-distended Ext: no edema Psych: pleasant, normal affect  Musculoskeletal:        General: Decreased Swelling and tenderness present.     Cervical back: Normal range of motion and neck supple.     Right lower leg:  In brace         Comments: Left hip tender to palpation and PROM with associated swelling. Left knee with mild effusion, patella laxity. Right hip tender with leg raise  Right knee still with swelling Left foot with tenderness over metatarsals in particular Skin:    Comments: Incision site dressing clean dry and intact with post-op dressing in place.  R IJ TDC with bandage Neuro: Alert and oriented x 3. Normal insight and awareness. Intact Memory. Normal language and speech. Cranial nerve exam unremarkable  Motor: Bilateral upper extremities: 5/5 proximal  distal Bilateral lower extremities:  Right lower extremity: Limited by bracing, hip flexion?  2/5   Assessment/Plan: 1. Functional deficits which require 3+ hours per day of interdisciplinary therapy in a comprehensive inpatient rehab setting. Physiatrist is providing close team supervision and 24 hour management of active medical problems listed below. Physiatrist and rehab team continue to assess barriers to discharge/monitor patient progress toward functional and medical goals  Care Tool:  Bathing    Body parts bathed by patient: Face   Body parts bathed by helper: Left lower leg Body parts n/a: Right lower leg   Bathing assist Assist Level: Independent with assistive device     Upper Body Dressing/Undressing Upper body dressing   What is the patient wearing?: Dress    Upper body assist Assist Level: Set up assist    Lower Body Dressing/Undressing Lower body dressing      What is the patient wearing?: Pants     Lower body assist Assist for lower body dressing: Minimal Assistance - Patient > 75%     Toileting Toileting Toileting Activity did not occur Landscape architect and  hygiene only): N/A (no void or bm)  Toileting assist Assist for toileting: Maximal Assistance - Patient 25 - 49%     Transfers Chair/bed transfer  Transfers assist     Chair/bed transfer assist level: Contact Guard/Touching assist Chair/bed transfer assistive device: Sliding board   Locomotion Ambulation   Ambulation assist   Ambulation activity did not occur: Safety/medical concerns          Walk 10 feet activity   Assist  Walk 10 feet activity did not occur: Safety/medical concerns        Walk 50 feet activity   Assist Walk 50 feet with 2 turns activity did not occur: Safety/medical concerns         Walk 150 feet activity   Assist Walk 150 feet activity did not occur: Safety/medical concerns         Walk 10 feet on uneven surface   activity   Assist Walk 10 feet on uneven surfaces activity did not occur: Safety/medical concerns         Wheelchair     Assist Is the patient using a wheelchair?: Yes Type of Wheelchair: Manual    Wheelchair assist level: Supervision/Verbal cueing Max wheelchair distance: 124ft    Wheelchair 50 feet with 2 turns activity    Assist        Assist Level: Supervision/Verbal cueing   Wheelchair 150 feet activity     Assist      Assist Level: Supervision/Verbal cueing   Blood pressure (!) 157/73, pulse 93, temperature 98.8 F (37.1 C), resp. rate 20, height 5\' 7"  (1.702 m), weight 77.6 kg, SpO2 99 %.    Medical Problem List and Plan: 1. Functional deficits secondary to nondisplaced fracture right inferior pubic ramus as well as a displaced foreshortened fracture through the left femoral neck/intertrochanteric area.  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   -Weightbearing as tolerated with anterior total hip precautions.                                  -Conservative care right inferior pubic ramus fracture, weightbearing as tolerated             -patient may  shower             -ELOS/Goals: 22 days total, supervision to min assist goals  Continue CIR therapies including PT, OT  -3/5 communicated with ortho, found post-op therapy directions.   -0-30 degrees flexion weeks 3-4   -0-60 degrees weeks 5-6  HFU scheduled. 2.  Impaired mobility -DVT/anticoagulation:  Mechanical: Antiembolism stockings, thigh (TED hose) Bilateral lower extremities. Vascular ultrasound reviewed and negative for clot.  -should be able to begin lovenox today as permacath placed            -antiplatelet therapy: N/A 3. Femur fracture pain: continue Flector patch, Robaxin 750 mg 4 times daily, hydrocodone and oxycodone as needed             -added kpad to help with spasms  -schedule tylenol 1000mg  TID  Provided list of foods that can help with pain 4. Anxiety:  Instructed on deep breathing techniques. Provide emotional support.  Melatonin as needed. Neuropsych eval 3/6. Provided list of foods that can help with anxiety 5. Neuropsych: This patient is capable of making decisions on her own behalf. 6. Skin/Wound Care: Routine skin checks 7. Fluids/Electrolytes/Nutrition: Routine in and outs with follow-up chemistries 8.  ESRD on  HD.  Permacath exchanged 10/03/2021.    -  Follow-up hemodialysis per Dr. [name] -Messaged therapy to see if we can practice transfers from inclined chair as she is nervous about this transfer during outpatient dialysis,  -HD later in day to allow participation in therapies during the day Appreciate nephro recs 9.  Acute on chronic anemia.  Has had transfusions already x 2. Hgb has stabilized in 7 range. continue iron supplement.              -epo per nephrology  Hemoglobin 8.7 on 2/24, appreciate nephro recs 10.  Diabetes mellitus with peripheral neuropathy. Placed order that she may use her Dexcom 6. Hemoglobin A1c 6.2.  SSI as prior to admission. Provide dietary education.  CBG (last 3)  Recent Labs    11/03/21 1629 11/03/21 2119 11/04/21 0617  GLUCAP 148* 209* 117*  Some lability will monitor. Increase Semglee to 20U, discussed plan to transition back to her novolog at home 11.  Hypertension. Decrease Cozaar to 75mg  to allow more fluid to be removed with HD Vitals:   11/03/21 2028 11/04/21 0521  BP: (!) 156/69 (!) 157/73  Pulse: 89 93  Resp: 20 20  Temp: 98.7 F (37.1 C) 98.8 F (37.1 C)  SpO2: 100% 99%   Mildly elevated, avoid overtreatment due to dialysis 12.  Hyperparathyroidism of renal origin.  Plan outpatient parathyroidectomy per Dr.Lateef 13.  Overweight.  BMI 29.11-->26.35  Dietary follow-up. Provided list of foods that can help with weight loss. Discussed benefits of intermittent fasting.  14. Cushingoid?--outpt work up 32. Constipation: resolved. Decrease colace to daily prn. D/c senna. Discussed  benefits of high fiber foods. Continue bowel regimen. D/c miralax.  16. Right quadriceps tear: surgery complete with good healing on XR, minimal postop pain, discussed importance of participating in 3 hours therapy daily as much as she can tolerate and she is very motivated and excited for rehab, remove dressing today  -ROM as above 17. Gas: continue simethicone prn 18. Ileus: resolved, regular diet resumed -tolerating.  19. Abd spasms: resolved 20. Black tarry stools: discussed heme occult negative last night. Hgb reviewed and stable.  21. Vitamin D deficiency: continue ergocalciferol 50,000U once per week for 7 weeks. Discussed that this dose is higher than a daily supplement.  22. Left heel pain and. Left foot pain over metatarsals-  3/5 MRI was reviewed with patient. There is associated atrophy of the forefoot muscles likely associated with her diabetes but no signs of fx, inflammation, etc.   -recommend appropriate shoewear to support arch and metatarsals  -added voltaren gel to help with pain control.  -stretching of arch/heel cord with therapies     LOS: 18 days A FACE TO FACE EVALUATION WAS PERFORMED  Clayden Withem P Destanie Tibbetts 11/04/2021, 10:06 AM

## 2021-11-04 NOTE — Progress Notes (Signed)
?[facility] ?Progress Note  ? ?Subjective:   Feeling well. Going home on Saturday.  ? ?Objective ?Vitals:  ? 11/03/21 1553 11/03/21 1600 11/03/21 2028 11/04/21 0521  ?BP: 117/64 (!) 154/65 (!) 156/69 (!) 157/73  ?Pulse: 83 79 89 93  ?Resp: (!) 21 20 20 20   ?Temp: 97.9 ?F (36.6 ?C) 97.9 ?F (36.6 ?C) 98.7 ?F (37.1 ?C) 98.8 ?F (37.1 ?C)  ?TempSrc: Oral     ?SpO2:   100% 99%  ?Weight:    77.6 kg  ?Height:      ? ?Physical Exam ?General:chronically ill appearing female in NAD ?Heart:RRR, no mrg ?Lungs:CTAB, nml WOB ?Abdomen:soft, NTND ?Extremities:no LE edema, R leg in brace ?Dialysis Access: [facility]  ? ?Dialysis Orders: ?MWF [facility] [phone]) ? 3h 50min  81kg   Hep 3000 then 800u/hr  RIJ TDC ?   - Hep B SAg neg 2/8, covid neg 1/29 ?  ?Assessment/Plan: ?Debility - rehab per CIR ?RLE tendon rupture - s/p surgery 2/16 here ?L hip fracture/ R pelvic ramus fx - sp L hemiarthroplasty on 09/29/21 at [facility], then sent here for CIR ?ESRD - on HD MWF. Next HD 3/08.  ?BP/ volume - increased cozaar to 100 mg daily. Lower dry wt at dc ?DM2 - per pmd ?Anemia ckd - Hgb trending down, last 8.7. S/p 1 unit prbc on 2/3.  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  Continue binders.  ?9. Nutrition - renal diet ?10.  Dispo: 3/11 discharge ? ?[name], MD ?11/04/2021, 3:34 PM ?[place] Weights  ? 11/01/21 0413 11/03/21 8466 11/04/21 5993  ?Weight: 78 kg 77.2 kg 77.6 kg  ? ? ?Intake/Output Summary (Last 24 hours) at 11/04/2021 1533 ?Last data filed at 11/04/2021 1401 ?Gross per 24 hour  ?Intake 954 ml  ?Output 1500 ml  ?Net -546 ml  ? ? ? ?Additional Objective ?Labs: ?Basic Metabolic Panel: ?Recent Labs  ?Lab 10/29/21 ?1326 10/31/21 ?5701 11/03/21 ?1332  ?NA 138  --  133*  ?K 4.2  --  5.7*  ?CL 99  --  95*  ?CO2 25  --  22  ?GLUCOSE 184*  --  127*  ?BUN 41*  --  51*  ?CREATININE 6.07* 5.22* 6.97*  ?CALCIUM 9.3  --  9.3  ?PHOS 4.6  --  5.2*  ? ? ?Liver Function  Tests: ?Recent Labs  ?Lab 10/29/21 ?1326 10/31/21 ?7793 11/03/21 ?1332  ?AST  --  16  --   ?ALT  --  9  --   ?ALKPHOS  --  719*  --   ?BILITOT  --  0.9  --   ?PROT  --  6.2*  --   ?ALBUMIN 3.3* 3.3* 3.6  ? ? ?CBC: ?Recent Labs  ?Lab 10/29/21 ?1325 11/03/21 ?1331  ?WBC 6.6 6.1  ?NEUTROABS  --  4.6  ?HGB 9.1* 9.1*  ?HCT 27.6* 28.9*  ?MCV 100.7* 103.2*  ?PLT 261 222  ? ? ?Medications: ? ? acetaminophen  650 mg Oral TID  ? Chlorhexidine Gluconate Cloth  6 each Topical Q0600  ? Chlorhexidine Gluconate Cloth  6 each Topical Q0600  ? darbepoetin (ARANESP) injection - DIALYSIS  60 mcg Intravenous Q Fri-HD  ? diclofenac Sodium  2 g Topical TID  ? enoxaparin (LOVENOX) injection  30 mg Subcutaneous Q24H  ? heparin sodium (porcine)  4,000 Units Intravenous Once  ? insulin aspart  0-9 Units Subcutaneous TID WC  ? insulin  glargine-yfgn  20 Units Subcutaneous Daily  ? losartan  75 mg Oral Daily  ? Vitamin D (Ergocalciferol)  50,000 Units Oral Q7 days  ? ? ? ? ? ? ? ? ? ?

## 2021-11-05 LAB — CBC
HCT: 28 % — ABNORMAL LOW (ref 36.0–46.0)
Hemoglobin: 9.2 g/dL — ABNORMAL LOW (ref 12.0–15.0)
MCH: 33.8 pg (ref 26.0–34.0)
MCHC: 32.9 g/dL (ref 30.0–36.0)
MCV: 102.9 fL — ABNORMAL HIGH (ref 80.0–100.0)
Platelets: 221 10*3/uL (ref 150–400)
RBC: 2.72 MIL/uL — ABNORMAL LOW (ref 3.87–5.11)
RDW: 16.3 % — ABNORMAL HIGH (ref 11.5–15.5)
WBC: 6 10*3/uL (ref 4.0–10.5)
nRBC: 0 % (ref 0.0–0.2)

## 2021-11-05 LAB — GLUCOSE, CAPILLARY
Glucose-Capillary: 124 mg/dL — ABNORMAL HIGH (ref 70–99)
Glucose-Capillary: 84 mg/dL (ref 70–99)
Glucose-Capillary: 157 mg/dL — ABNORMAL HIGH (ref 70–99)
Glucose-Capillary: 128 mg/dL — ABNORMAL HIGH (ref 70–99)

## 2021-11-05 LAB — RENAL FUNCTION PANEL
Albumin: 3.4 g/dL — ABNORMAL LOW (ref 3.5–5.0)
Anion gap: 15 (ref 5–15)
BUN: 45 mg/dL — ABNORMAL HIGH (ref 6–20)
CO2: 25 mmol/L (ref 22–32)
Calcium: 9.2 mg/dL (ref 8.9–10.3)
Chloride: 100 mmol/L (ref 98–111)
Creatinine, Ser: 5.7 mg/dL — ABNORMAL HIGH (ref 0.44–1.00)
GFR, Estimated: 8 mL/min — ABNORMAL LOW (ref >60)
Glucose, Bld: 172 mg/dL — ABNORMAL HIGH (ref 70–99)
Phosphorus: 5.4 mg/dL — ABNORMAL HIGH (ref 2.5–4.6)
Potassium: 5.3 mmol/L — ABNORMAL HIGH (ref 3.5–5.1)
Sodium: 140 mmol/L (ref 135–145)

## 2021-11-05 MED ORDER — HEPARIN SODIUM (PORCINE) 1000 UNIT/ML DIALYSIS
2000.0000 [IU] | INTRAMUSCULAR | Status: DC | PRN
  Administered 2021-11-05: 2000 [IU] via INTRAVENOUS_CENTRAL
  Filled 2021-11-05: qty 2

## 2021-11-05 MED ORDER — DOUBLE ANTIBIOTIC 500-10000 UNIT/GM EX OINT
TOPICAL_OINTMENT | Freq: Two times a day (BID) | CUTANEOUS | Status: DC
  Filled 2021-11-05: qty 28.4

## 2021-11-05 NOTE — Patient Care Conference (Signed)
Inpatient RehabilitationTeam Conference and Plan of Care Update ?Date: 11/05/2021   Time: 11:32 AM  ? ? ?Patient Name: [name]      ?Medical Record Number: [id number]  ?Date of Birth: 1966-01-01 ?Sex: Female         ?Room/Bed: 2W97L/8X21J-94 ?Payor Info: Payor: MEDICARE / Plan: MEDICARE PART A AND B / Product Type: *No Product type* /   ? ?Admit Date/Time:  10/17/2021 12:48 PM ? ?Primary Diagnosis:  Intertrochanteric fracture of left hip ([place]) ? ?Hospital Problems: Principal Problem: ?  Intertrochanteric fracture of left hip ([place]) ?Active Problems: ?  ESRD on hemodialysis ([place]) ?  Acute on chronic anemia ?  Diabetic peripheral neuropathy ([place]) ? ? ? ?Expected Discharge Date: Expected Discharge Date: 11/08/21 ? ?Team Members Present: ?Physician leading conference: Dr. Leeroy Cha ?Social Worker Present: Erlene Quan, BSW ?Nurse Present: Dorien Chihuahua, RN ?PT Present: Becky Sax, PT ?OT Present: Other (comment) [facility] Alphonsa Gin, [place]) ?SLP Present: Charolett Bumpers, SLP ?PPS Coordinator present : Gunnar Fusi, SLP ? ?   Current Status/Progress Goal Weekly Team Focus  ?Bowel/Bladder ? ? Continent of bowel and bladder,Pt is oliguric/Dialysis MWF,: [facility] 11/04/21  Remain continent of bowel and bladder  Assess bowel and bladder needs q shift and PRN   ?Swallow/Nutrition/ Hydration ? ?           ?ADL's ? ? min A LB dressing, set up A UB B/D, mod A toileting, supervision toilet transfers via slideboard to padded BSC  goals upgraded to mod I for self-care  increasing independence with toileting/hygiene mgmt, d/c planning   ?Mobility ? ? bed mobility supervision, slideboard transfers CGA/supervision, stands from 23in high mat with [place] walker and min A/CGA,WC mobility 169ft supervision, car transfers with slideboard and supervision, unable to ambulate at this time  min A standing, Mod I transfers and bed mobility  functional mobility/transfers, bed mobility, car transfers, dynamic standing balance/tolerance, D/C planning,  family education   ?Communication ? ?           ?Safety/Cognition/ Behavioral Observations ?           ?Pain ? ? Denies pain. Pt rates pain 0/10.  Pt rates pain < or equal to 2/10.  Assess pain q shift and PRN.   ?Skin ? ? Left hip wound, small dehisced area with purulent drainage. Right knee surgical dressing in place with knee immobilizer.  Remain free from skin breakdown.  Assess skin q shift and PRN   ? ? ?Discharge Planning:  ?Discharging home on Saturday. DME orderes through [employer]. [place] set with [employer]   ?Team Discussion: ?Patient is all set for discharge. Incision open on left leg; MD addressed care, topical ointment. ? ?Patient on target to meet rehab goals: ?yes, she has met all goals for discharge including car transfers. Currently needs min assist for lower body care and only set up for upper body. Needs assist for toileting, spouse able to assist.  ? ?*See Care Plan and progress notes for long and short-term goals.  ? ?Revisions to Treatment Plan:  ?N/A ?  ?Teaching Needs: ?Family education completed 11/04/21 ?  ?Current Barriers to Discharge: ?None identified ? ?Possible Resolutions to Barriers: ?HH follow up services arranged ?Has recommended DME ?  ? ? Medical Summary ?Current Status: left intertrochancteric fracture with wound dehiscience and yellowing of bases, right quad tendon tear fixation incision, uncontrolled type 2 DM ? Barriers to Discharge: Medical stability;Weight bearing restrictions;Wound care;Weight ? Barriers to Discharge Comments: left intertrochancteric fracture with wound dehiscience  and yellowing of bases, right quad tendon tear fixation incision, uncontrolled type 2 DM ?Possible Resolutions to [employer]: continue to monitor both incisions, add bacitrain to left hip incision, contacted both of her surgeons, continue semglee and titrate as needed ? ? ?Continued Need for Acute Rehabilitation Level of Care: The patient requires daily medical management by a physician with  specialized training in physical medicine and rehabilitation for the following reasons: ?Direction of a multidisciplinary physical rehabilitation program to maximize functional independence : Yes ?Medical management of patient stability for increased activity during participation in an intensive rehabilitation regime.: Yes ?Analysis of laboratory values and/or radiology reports with any subsequent need for medication adjustment and/or medical intervention. : Yes ? ? ?I attest that I was present, lead the team conference, and concur with the assessment and plan of the team. ? ? ?Dorien Chihuahua B ?11/05/2021, 1:18 PM  ? ? ? ? ? ? ?

## 2021-11-05 NOTE — Progress Notes (Signed)
Occupational Therapy Session Note ? ?Patient Details  ?Name: [name] ?MRN: [id number] ?Date of Birth: 01/13/1966 ? ?Today's Date: 11/05/2021 ?OT Individual Time: 3151-7616 ?OT Individual Time Calculation (min): 59 min  and Today's Date: 11/05/2021 ?OT Missed Time: 15 Minutes ?Missed Time Reason: Other (comment) (tornado drill- staff mandated) ? ? ?Short Term Goals: ?Week 3:  OT Short Term Goal 1 (Week 3): STG = LTG due to ELOS ? ?Skilled Therapeutic Interventions/Progress Updates:  ?Skilled OT intervention completed with focus on self-care, functional transfers. Pt received seated in w/c, requesting to wash hair. Using washing tray, therapist only rinsed hair with total A, with pt cleaning hair with set up A of cleanser. Seated at sink, pt able to complete UB bathing, dressing, hair styling, facial cosmetics with set up A. Pt reported she purchased a pink hair tray for home use and was excited about it. Ran out of time to practice LB dressing again, so pt would benefit from this prior to d/c. ? ?MD requested for pt to have brace unlocked for prepping to have dressing change at end of session, with therapist supervising pt with slideboard transfer from w/c to EOB. Pt requested to stay in w/c, however therapist educated pt on safety concern that if her leg fell off the leg rest while waiting, the knee could be forced into flexion, so flat surface like the bed is preferred. Pt reported she unlocks the brace to apply lotion on her own, with this therapist educating pt on using caution as to not do any bending without PT present due to skilled nature of the progression and prohibited self-ROM, especially as she d/c home. Pt seemed compliant and receptive. Provided education on skin care on the R leg due to lack of removal from brace, with pt washing/applying lotion to her leg with set up A. Left brace open to dry. Pt was left upright in bed, with bed alarm on, brace unlocked for MD's prompt arrival and all needs in reach  at end of session. ? ? ?Therapy Documentation ?Precautions:  ?Precautions ?Precautions: Fall, Anterior Hip, Other (comment) ?Precaution Comments: HD access port RUQ, L LE anterior hip precautions, R LE rehab protocol in pt's paper chart ?Required Braces or Orthoses: Knee Immobilizer - Right ?Knee Immobilizer - Right: On at all times, Other (comment) ?Restrictions ?Weight Bearing Restrictions: Yes ?RLE Weight Bearing: Touchdown weight bearing ?LLE Weight Bearing: Weight bearing as tolerated ?Other Position/Activity Restrictions: L LE anterior hip precautions - no hip extension, hip external rotation, or abduction ? ?Pain: ?No c/o pain ? ? ?Therapy/Group: Individual Therapy ? ?Azya Barbero E Kiko Ripp ?11/05/2021, 7:35 AM ?

## 2021-11-05 NOTE — Progress Notes (Signed)
[place] and spoke to [place], [place]. Clinic advised pt will be d/c on 3/11 (Sat) as planned and will resume care at clinic on Monday.  ? ?Melven Sartorius ?Renal Navigator ?[phone] ?

## 2021-11-05 NOTE — Progress Notes (Signed)
PROGRESS NOTE   Subjective/Complaints: No new complaints this morning Right knee incision looks really good Left hip with slight opening and yellow base- topical abx ordered for prophylaxis  ROS: Patient denies fever, rash, sore throat, blurred vision, dizziness, nausea, vomiting, diarrhea, cough, shortness of breath or chest pain, headache, or mood change. +right knee pain with stretching  Objective:   No results found. Recent Labs    11/03/21 1331  WBC 6.1  HGB 9.1*  HCT 28.9*  PLT 222        Recent Labs    11/03/21 1332  NA 133*  K 5.7*  CL 95*  CO2 22  GLUCOSE 127*  BUN 51*  CREATININE 6.97*  CALCIUM 9.3       Intake/Output Summary (Last 24 hours) at 11/05/2021 1042 Last data filed at 11/05/2021 0700 Gross per 24 hour  Intake 476 ml  Output --  Net 476 ml         Physical Exam: Vital Signs Blood pressure (!) 158/71, pulse 85, temperature 97.9 F (36.6 C), temperature source Oral, resp. rate 20, height 5\' 7"  (1.702 m), weight 75.1 kg, SpO2 100 %. Gen: no distress, normal appearing HEENT: oral mucosa pink and moist, NCAT Cardio: Reg rate Chest: normal effort, normal rate of breathing Abd: soft, non-distended Ext: no edema Psych: pleasant, normal affect Skin:     Musculoskeletal:        General: Decreased Swelling and tenderness present.     Cervical back: Normal range of motion and neck supple.     Right lower leg:  In brace         Comments: Left hip tender to palpation and PROM with associated swelling. Left knee with mild effusion, patella laxity. Right hip tender with leg raise  Right knee still with swelling Left foot with tenderness over metatarsals in particular Skin:    Comments: Incision site dressing clean dry and intact with post-op dressing in place.  R IJ TDC with bandage Neuro: Alert and oriented x 3. Normal insight and awareness. Intact Memory. Normal language and  speech. Cranial nerve exam unremarkable  Motor: Bilateral upper extremities: 5/5 proximal distal Bilateral lower extremities:  Right lower extremity: Limited by bracing, hip flexion?  2/5   Assessment/Plan: 1. Functional deficits which require 3+ hours per day of interdisciplinary therapy in a comprehensive inpatient rehab setting. Physiatrist is providing close team supervision and 24 hour management of active medical problems listed below. Physiatrist and rehab team continue to assess barriers to discharge/monitor patient progress toward functional and medical goals  Care Tool:  Bathing    Body parts bathed by patient: Face   Body parts bathed by helper: Left lower leg Body parts n/a: Right lower leg   Bathing assist Assist Level: Independent with assistive device     Upper Body Dressing/Undressing Upper body dressing   What is the patient wearing?: Dress    Upper body assist Assist Level: Set up assist    Lower Body Dressing/Undressing Lower body dressing      What is the patient wearing?: Pants     Lower body assist Assist for lower body dressing: Minimal Assistance - Patient > 75%  Toileting Toileting Toileting Activity did not occur Landscape architect and hygiene only): N/A (no void or bm)  Toileting assist Assist for toileting: Maximal Assistance - Patient 25 - 49%     Transfers Chair/bed transfer  Transfers assist     Chair/bed transfer assist level: Supervision/Verbal cueing Chair/bed transfer assistive device: Sliding board   Locomotion Ambulation   Ambulation assist   Ambulation activity did not occur: Safety/medical concerns          Walk 10 feet activity   Assist  Walk 10 feet activity did not occur: Safety/medical concerns        Walk 50 feet activity   Assist Walk 50 feet with 2 turns activity did not occur: Safety/medical concerns         Walk 150 feet activity   Assist Walk 150 feet activity did not occur:  Safety/medical concerns         Walk 10 feet on uneven surface  activity   Assist Walk 10 feet on uneven surfaces activity did not occur: Safety/medical concerns         Wheelchair     Assist Is the patient using a wheelchair?: Yes Type of Wheelchair: Manual    Wheelchair assist level: Supervision/Verbal cueing Max wheelchair distance: 134ft    Wheelchair 50 feet with 2 turns activity    Assist        Assist Level: Supervision/Verbal cueing   Wheelchair 150 feet activity     Assist      Assist Level: Supervision/Verbal cueing   Blood pressure (!) 158/71, pulse 85, temperature 97.9 F (36.6 C), temperature source Oral, resp. rate 20, height 5\' 7"  (1.702 m), weight 75.1 kg, SpO2 100 %.    Medical Problem List and Plan: 1. Functional deficits secondary to nondisplaced fracture right inferior pubic ramus as well as a displaced foreshortened fracture through the left femoral neck/intertrochanteric area.  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   -Weightbearing as tolerated with anterior total hip precautions.                                  -Conservative care right inferior pubic ramus fracture, weightbearing as tolerated             -patient may  shower             -ELOS/Goals: 22 days total, supervision to min assist goals  Continue CIR therapies including PT, OT  -3/8 communicated with ortho, found post-op therapy directions.   -0-30 degrees flexion weeks 3-4   -0-60 degrees weeks 5-6  -can leave incision open to air but patient prefers dressing for comfort.   HFU scheduled. 2.  Impaired mobility -DVT/anticoagulation:  Mechanical: Antiembolism stockings, thigh (TED hose) Bilateral lower extremities. Vascular ultrasound reviewed and negative for clot.  -should be able to begin lovenox today as permacath placed            -antiplatelet therapy: N/A 3. Femur fracture pain: continue Flector patch, Robaxin 750 mg 4 times daily,  hydrocodone and oxycodone as needed             -added kpad to help with spasms  -schedule tylenol 1000mg  TID  Provided list of foods that can help with pain 4. Anxiety: Instructed on deep breathing techniques. Provide emotional support.  Melatonin as needed. Neuropsych eval 3/6. Provided list of foods that can help with anxiety 5. Neuropsych: This patient is capable  of making decisions on her own behalf. 6. Left hip with small area of wound dehiscence, yellowing of base: WBC normal, afebrile. Discussed not likely infection but could use antibiotic prophylactically- she prefers topical abx- bacitracin ordered BID. Surgery contacted.  7. Fluids/Electrolytes/Nutrition: Routine in and outs with follow-up chemistries 8.  ESRD on HD.  Permacath exchanged 10/03/2021.    -  Follow-up hemodialysis per Dr. [name] -Messaged therapy to see if we can practice transfers from inclined chair as she is nervous about this transfer during outpatient dialysis,  -HD later in day to allow participation in therapies during the day Appreciate nephro recs 9.  Acute on chronic anemia.  Has had transfusions already x 2. Hgb has stabilized in 7 range. continue iron supplement.              -epo per nephrology  Hemoglobin 8.7 on 2/24, appreciate nephro recs 10.  Diabetes mellitus with peripheral neuropathy. Placed order that she may use her Dexcom 6. Hemoglobin A1c 6.2.  SSI as prior to admission. Provide dietary education.  CBG (last 3)  Recent Labs    11/04/21 1705 11/04/21 2111 11/05/21 0624  GLUCAP 140* 141* 124*  Some lability will monitor. Continue Semglee to 20U, discussed plan to transition back to her novolog at home 11.  Hypertension. Decrease Cozaar to 75mg  to allow more fluid to be removed with HD Vitals:   11/04/21 1936 11/05/21 0526  BP: (!) 164/65 (!) 158/71  Pulse: 80 85  Resp: 20 20  Temp: 97.8 F (36.6 C) 97.9 F (36.6 C)  SpO2: 100% 100%   Mildly elevated, avoid overtreatment due to  dialysis 12.  Hyperparathyroidism of renal origin.  Plan outpatient parathyroidectomy per Dr.Lateef 13.  Overweight.  BMI 29.11-->26.35  Dietary follow-up. Provided list of foods that can help with weight loss. Discussed benefits of intermittent fasting.  14. Cushingoid?--outpt work up 42. Constipation: resolved. Decrease colace to daily prn. D/c senna. Discussed benefits of high fiber foods. Continue bowel regimen. D/c miralax.  16. Right quadriceps tear: surgery complete with good healing on XR, minimal postop pain, discussed importance of participating in 3 hours therapy daily as much as she can tolerate and she is very motivated and excited for rehab, remove dressing today  -ROM as above 17. Gas: continue simethicone prn 18. Ileus: resolved, regular diet resumed -tolerating.  19. Abd spasms: resolved 20. Black tarry stools: discussed heme occult negative last night. Hgb reviewed and stable.  21. Vitamin D deficiency: continue ergocalciferol 50,000U once per week for 7 weeks. Discussed that this dose is higher than a daily supplement.  22. Left heel pain and. Left foot pain over metatarsals-  3/5 MRI was reviewed with patient. There is associated atrophy of the forefoot muscles likely associated with her diabetes but no signs of fx, inflammation, etc.   -recommend appropriate shoewear to support arch and metatarsals  -added voltaren gel to help with pain control.  -stretching of arch/heel cord with therapies     LOS: 19 days A FACE TO FACE EVALUATION WAS PERFORMED  Mykai Wendorf P Maxim Bedel 11/05/2021, 10:42 AM

## 2021-11-05 NOTE — Progress Notes (Signed)
Physical Therapy Session Note ? ?Patient Details  ?Name: [name] ?MRN: [id number] ?Date of Birth: [birth year] ? ?Today's Date: 11/05/2021 ?PT Individual Time: 8921-1941 ?PT Individual Time Calculation (min): 58 min  ? ?Short Term Goals: ?Week 3:  PT Short Term Goal 1 (Week 3): STG=LTG due to LOS ? ? ?Skilled Therapeutic Interventions/Progress Updates:  ?   ?Pt presenting supine in bed, awake and agreeable to PT tx. Pt reporting 0/10 pain at rest, evolving to 5/10 while stretching her RLE  in the bed. Pt received pain medication prior to session. Pt requesting to focus session on stretching and strengthening her RLE.  ? ?Pt able to direct care well, even able to direct how to unlock bledsode brace to 30deg which she did with PT supervision. She also required repositioning of bledsoe as it was somewhat sliding down her leg. Completed AAROM knee flexion from 0 to 30deg with good tolerance and only c/o mild discomfort. Pt surprised at her ability to do this. Provided pillows under knee to promote prolonged stretching with knee flexed at 30deg, 27min + 5 min which she tolerated well with the 5/10 discomfort. Next, assisted her with heel cord and hamstring stretching in the bed - pt reporting comfort and her muscles "releasing" with stretching.  ? ?MD entering room for morning rounding.  ? ?Completed 2x10 heel slides (AAROM) and 2x10 hip abduction (AAROM) on L, notably weak hip abductors. Pt able to lock bledsoe brace to extension without assist but supervision from PT. ? ?Supine<>sitting EOB mod I. SB transfer completed with seutpA and PT stabilizing w/c for added safety.  ? ?Provided ice pack for R knee during session to assist with edema control and pain.  ? ?Concluded session seated in w/c with all needs in reach. ? ?Therapy Documentation ?Precautions:  ?Precautions ?Precautions: Fall, Anterior Hip, Other (comment) ?Precaution Comments: HD access port RUQ, L LE anterior hip precautions, R LE rehab protocol in pt's  paper chart ?Required Braces or Orthoses: Knee Immobilizer - Right ?Knee Immobilizer - Right: On at all times, Other (comment) ?Restrictions ?Weight Bearing Restrictions: Yes ?RLE Weight Bearing: Touchdown weight bearing ?LLE Weight Bearing: Weight bearing as tolerated ?Other Position/Activity Restrictions: L LE anterior hip precautions - no hip extension, hip external rotation, or abduction ?General: ?  ? ?Therapy/Group: Individual Therapy ? ?Alger Simons PT ?11/05/2021, 7:40 AM  ?

## 2021-11-05 NOTE — Procedures (Signed)
? ?  I was present at this dialysis session, have reviewed the session itself and made  appropriate changes ?[name] MD ?[employer] ?pager [phone]   ?11/05/2021, 3:14 PM ? ? ?

## 2021-11-05 NOTE — Progress Notes (Signed)
? ?  SW met with patient. Informed patient of the approval from [facility]. SW added RN due to small opening.  Patient DME in place. Patient stable for d/c on 3/11 ?

## 2021-11-05 NOTE — Progress Notes (Signed)
Physical Therapy Session Note ? ?Patient Details  ?Name: Tanajah Boulter ?MRN: [id number] ?Date of Birth: Mar 17, 1966 ? ?Today's Date: 11/05/2021 ?PT Individual Time: 3557-3220 ?PT Individual Time Calculation (min): 55 min  ? ?Short Term Goals: ?Week 2:  PT Short Term Goal 1 (Week 2): pt will transfer sit<>supine with LRAD and min A consistantly ?PT Short Term Goal 1 - Progress (Week 2): Met ?PT Short Term Goal 2 (Week 2): pt will perform transfers with LRAD and min A consistantly ?PT Short Term Goal 2 - Progress (Week 2): Met ?PT Short Term Goal 3 (Week 2): pt will initiate gait training ?PT Short Term Goal 3 - Progress (Week 2): Not met ?Week 3:  PT Short Term Goal 1 (Week 3): STG=LTG due to LOS ? ?Skilled Therapeutic Interventions/Progress Updates:  ? Received pt sitting in bed with MD present inspecting incision. Per MD report, incision looking good and notified RN to apply new dressing - RN present to do so. Pt agreeable to PT treatment and denied any pain at start of session but reported increased R knee pain after therapy. Session with emphasis on functional mobility/transfers, generalized strengthening and endurance, and dynamic standing balance/coordination. Pt donned Bledsoe brace mod I and transferred long sitting<>sitting EOB with mod I. Bed<>WC via slideboard with supervision and transported to/from room in [facility] dependently for time management and energy conservation purposes. Pt able to set up transfer to mat with supervision (but required assist to doff legrests), and transferred WC<>mat via slideboard with supervision. Raised mat to 23in and placed [name] walker in front of pt and transferred sit<>stand with mirror in front for visual feedback with CGA/close supervision with Airex underneath RLE to assist with adherence to [facility] precautions - stood for 3 minutes with distant supervision. Returned to sitting and lowered mat to 22.5in and pt stood again with CGA - stood again for 3 minutes but reported increased  soreness/fatigue in RLE. Pt transferred mat<>WC via slideboard with supervision with pt able to set up transfer without assist. Then worked on LLE strengthening on Kinetron at 30 cm/sec for 1 minute x 3 trials using 5lb dowel to provide counter resistance - emphasis on glute/quad strength and ROM. Pt transferred WC<>bed via slideboard in same manner with supervision and sit<>semi-reclined mod I. Concluded session with pt semi-reclined in bed with all needs within reach. Provided pt with ice pack for R knee.  ? ?Therapy Documentation ?Precautions:  ?Precautions ?Precautions: Fall, Anterior Hip, Other (comment) ?Precaution Comments: HD access port RUQ, L LE anterior hip precautions, R LE rehab protocol in pt's paper chart ?Required Braces or Orthoses: Knee Immobilizer - Right ?Knee Immobilizer - Right: On at all times, Other (comment) ?Restrictions ?Weight Bearing Restrictions: Yes ?RLE Weight Bearing: Touchdown weight bearing ?LLE Weight Bearing: Weight bearing as tolerated ?Other Position/Activity Restrictions: L LE anterior hip precautions - no hip extension, hip external rotation, or abduction ? ?Therapy/Group: Individual Therapy ?Blenda Nicely ?Becky Sax PT, DPT  ? ?11/05/2021, 7:19 AM  ?

## 2021-11-06 LAB — GLUCOSE, CAPILLARY
Glucose-Capillary: 100 mg/dL — ABNORMAL HIGH (ref 70–99)
Glucose-Capillary: 127 mg/dL — ABNORMAL HIGH (ref 70–99)
Glucose-Capillary: 118 mg/dL — ABNORMAL HIGH (ref 70–99)
Glucose-Capillary: 149 mg/dL — ABNORMAL HIGH (ref 70–99)

## 2021-11-06 MED ORDER — CEPHALEXIN 250 MG PO CAPS
500.0000 mg | ORAL_CAPSULE | Freq: Two times a day (BID) | ORAL | Status: DC
  Administered 2021-11-06 – 2021-11-08 (×5): 500 mg via ORAL
  Filled 2021-11-06 (×5): qty 2

## 2021-11-06 MED ORDER — SODIUM CHLORIDE 0.9 % IV BOLUS: 1500.0000 mL | INTRAVENOUS | Status: DC

## 2021-11-06 NOTE — Progress Notes (Signed)
PROGRESS NOTE   Subjective/Complaints: Area of yellowing and erythema slightly larger on left hip. Discussed that I recommend starting keflex prophylactically and she is agreeable  ROS: Patient denies fever, rash, sore throat, blurred vision, dizziness, nausea, vomiting, diarrhea, cough, shortness of breath or chest pain, headache, or mood change. +right knee pain with stretching  Objective:   No results found. Recent Labs    11/03/21 1331 11/05/21 1200  WBC 6.1 6.0  HGB 9.1* 9.2*  HCT 28.9* 28.0*  PLT 222 221        Recent Labs    11/03/21 1332 11/05/21 1338  NA 133* 140  K 5.7* 5.3*  CL 95* 100  CO2 22 25  GLUCOSE 127* 172*  BUN 51* 45*  CREATININE 6.97* 5.70*  CALCIUM 9.3 9.2       Intake/Output Summary (Last 24 hours) at 11/06/2021 1042 Last data filed at 11/06/2021 0646 Gross per 24 hour  Intake 120 ml  Output 1501 ml  Net -1381 ml         Physical Exam: Vital Signs Blood pressure (!) 167/76, pulse 91, temperature 98.2 F (36.8 C), temperature source Oral, resp. rate 18, height 5\' 7"  (1.702 m), weight 76 kg, SpO2 100 %. Gen: no distress, normal appearing, BMI 26.24 HEENT: oral mucosa pink and moist, NCAT Cardio: Reg rate Chest: normal effort, normal rate of breathing Abd: soft, non-distended Ext: no edema Psych: pleasant, normal affect Skin:    Slightly increased erythema and yellowing of left hip incision Musculoskeletal:        General: Decreased Swelling and tenderness present.     Cervical back: Normal range of motion and neck supple.     Right lower leg:  In brace         Comments: Left hip tender to palpation and PROM with associated swelling. Left knee with mild effusion, patella laxity. Right hip tender with leg raise  Right knee still with swelling Left foot with tenderness over metatarsals in particular Skin:    Comments: Incision site dressing clean dry and intact with  post-op dressing in place.  R IJ TDC with bandage Neuro: Alert and oriented x 3. Normal insight and awareness. Intact Memory. Normal language and speech. Cranial nerve exam unremarkable  Motor: Bilateral upper extremities: 5/5 proximal distal Bilateral lower extremities:  Right lower extremity: Limited by bracing, hip flexion?  2/5   Assessment/Plan: 1. Functional deficits which require 3+ hours per day of interdisciplinary therapy in a comprehensive inpatient rehab setting. Physiatrist is providing close team supervision and 24 hour management of active medical problems listed below. Physiatrist and rehab team continue to assess barriers to discharge/monitor patient progress toward functional and medical goals  Care Tool:  Bathing    Body parts bathed by patient: Face, Right lower leg, Right arm, Left arm, Chest, Abdomen   Body parts bathed by helper: Left lower leg Body parts n/a: Right lower leg   Bathing assist Assist Level: Set up assist     Upper Body Dressing/Undressing Upper body dressing   What is the patient wearing?: Dress    Upper body assist Assist Level: Set up assist    Lower Body Dressing/Undressing  Lower body dressing      What is the patient wearing?: Pants     Lower body assist Assist for lower body dressing: Minimal Assistance - Patient > 75%     Toileting Toileting Toileting Activity did not occur Landscape architect and hygiene only): N/A (no void or bm)  Toileting assist Assist for toileting: Maximal Assistance - Patient 25 - 49%     Transfers Chair/bed transfer  Transfers assist     Chair/bed transfer assist level: Supervision/Verbal cueing Chair/bed transfer assistive device: Sliding board   Locomotion Ambulation   Ambulation assist   Ambulation activity did not occur: Safety/medical concerns          Walk 10 feet activity   Assist  Walk 10 feet activity did not occur: Safety/medical concerns        Walk 50 feet  activity   Assist Walk 50 feet with 2 turns activity did not occur: Safety/medical concerns         Walk 150 feet activity   Assist Walk 150 feet activity did not occur: Safety/medical concerns         Walk 10 feet on uneven surface  activity   Assist Walk 10 feet on uneven surfaces activity did not occur: Safety/medical concerns         Wheelchair     Assist Is the patient using a wheelchair?: Yes Type of Wheelchair: Manual    Wheelchair assist level: Supervision/Verbal cueing Max wheelchair distance: 171ft    Wheelchair 50 feet with 2 turns activity    Assist        Assist Level: Supervision/Verbal cueing   Wheelchair 150 feet activity     Assist      Assist Level: Supervision/Verbal cueing   Blood pressure (!) 167/76, pulse 91, temperature 98.2 F (36.8 C), temperature source Oral, resp. rate 18, height 5\' 7"  (1.702 m), weight 76 kg, SpO2 100 %.    Medical Problem List and Plan: 1. Functional deficits secondary to nondisplaced fracture right inferior pubic ramus as well as a displaced foreshortened fracture through the left femoral neck/intertrochanteric area.  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   -Weightbearing as tolerated with anterior total precautions.                                  -Conservative care right inferior pubic ramus fracture, weightbearing as tolerated             -patient may  shower             -ELOS/Goals: 22 days total, supervision to min assist goals  Continue CIR therapies including PT, OT  -3/8 communicated with ortho, found post-op therapy directions.   -0-30 degrees flexion weeks 3-4   -0-60 degrees weeks 5-6  -can leave incision open to air but patient prefers dressing for comfort.   HFU scheduled. 2.  Impaired mobility -DVT/anticoagulation:  Mechanical: Antiembolism stockings, thigh (TED hose) Bilateral lower extremities. Vascular ultrasound reviewed and negative for clot.  -should  be able to begin lovenox today as permacath placed            -antiplatelet therapy: N/A 3. Femur fracture pain: continue Flector patch, Robaxin 750 mg 4 times daily, hydrocodone and oxycodone as needed             -added kpad to help with spasms  -schedule tylenol 1000mg  TID  Provided list of foods that can  help with pain 4. Anxiety: Instructed on deep breathing techniques. Provide emotional support.  Melatonin as needed. Neuropsych eval 3/6. Provided list of foods that can help with anxiety 5. Neuropsych: This patient is capable of making decisions on her own behalf. 6. Left hip with small area of wound dehiscence, yellowing of base: WBC normal, afebrile. Discussed not likely infection but could use antibiotic prophylactically- she prefers topical abx- bacitracin ordered BID. Surgery contacted. Start Keflex 500mg  BID prophylactically given increase in size.  7. Fluids/Electrolytes/Nutrition: Routine in and outs with follow-up chemistries 8.  ESRD on HD.  Permacath exchanged 10/03/2021.    -  Follow-up hemodialysis per Dr. [name] -Messaged therapy to see if we can practice transfers from inclined chair as she is nervous about this transfer during outpatient dialysis,  -HD later in day to allow participation in therapies during the day Appreciate nephro recs Discussed her efforts to get HD at home, her coversations with Dr. Zollie Scale.  9.  Acute on chronic anemia.  Has had transfusions already x 2. Hgb has stabilized in 7 range. continue iron supplement.              -epo per nephrology  Hemoglobin 8.7 on 2/24, appreciate nephro recs 10.  Diabetes mellitus with peripheral neuropathy. Placed order that she may use her Dexcom 6. Hemoglobin A1c 6.2.  SSI as prior to admission. Provide dietary education.  CBG (last 3)  Recent Labs    11/05/21 1653 11/05/21 2243 11/06/21 0608  GLUCAP 157* 128* 100*  Some lability will monitor. Continue Semglee to 20U, discussed plan to transition back to her novolog  at home 11.  Hypertension. Decrease Cozaar to 75mg  to allow more fluid to be removed with HD Vitals:   11/05/21 1917 11/06/21 0452  BP: (!) 167/72 (!) 167/76  Pulse: 86 91  Resp: 18 18  Temp: 98.5 F (36.9 C) 98.2 F (36.8 C)  SpO2: 100% 100%   Mildly elevated, avoid overtreatment due to dialysis 12.  Hyperparathyroidism of renal origin.  Plan outpatient parathyroidectomy per Dr.Lateef 13.  Overweight.  BMI 29.11-->26.35  Dietary follow-up. Provided list of foods that can help with weight loss. Discussed benefits of intermittent fasting.  14. Cushingoid?--outpt work up 10. Constipation: resolved. Decrease colace to daily prn. D/c senna. Discussed benefits of high fiber foods. Continue bowel regimen. D/c miralax.  16. Right quadriceps tear: surgery complete with good healing on XR, minimal postop pain, discussed importance of participating in 3 hours therapy daily as much as she can tolerate and she is very motivated and excited for rehab, remove dressing today  -ROM as above 17. Gas: continue simethicone prn 18. Ileus: resolved, regular diet resumed -tolerating.  19. Abd spasms: resolved 20. Black tarry stools: discussed heme occult negative last night. Hgb reviewed and stable.  21. Vitamin D deficiency: continue ergocalciferol 50,000U once per week for 7 weeks. Discussed that this dose is higher than a daily supplement.  22. Left heel pain and. Left foot pain over metatarsals-  3/5 MRI was reviewed with patient. There is associated atrophy of the forefoot muscles likely associated with her diabetes but no signs of fx, inflammation, etc.   -recommend appropriate shoewear to support arch and metatarsals  -added voltaren gel to help with pain control.  -stretching of arch/heel cord with therapies      LOS: 20 days A FACE TO FACE EVALUATION WAS PERFORMED  Annah Jasko P [facility] 11/06/2021, 10:42 AM

## 2021-11-06 NOTE — Progress Notes (Signed)
Occupational Therapy Note ? ?Patient Details  ?Name: [name] ?MRN: [id number] ?Date of Birth: [birth year] ? ?Today's Date: 11/06/2021 ?OT Missed Time: 60 Minutes ?Missed Time Reason: Patient fatigue ? ?Pt requested hold from therapy 2/2 fatigue. Note from MD to hold from therapy. Pt missed 60 mins skilled OT services.  ? ?[name] ?11/06/2021, 2:10 PM ?

## 2021-11-06 NOTE — Progress Notes (Signed)
Physical Therapy Note ? ?Patient Details  ?Name: [name] ?MRN: [id number] ?Date of Birth: 11/17/1965 ?Today's Date: 11/06/2021 ? ? ? ?Pt requested hold from therapy 2/2 fatigue.  MD order for hold therapy.  Missed PT time of 45 min.  ? ?Ladoris Gene ?11/06/2021, 10:54 AM  ?

## 2021-11-06 NOTE — Progress Notes (Addendum)
?[name] ?Progress Note  ? ?Subjective:   Feeling well.  ? ?Objective ?Vitals:  ? 11/05/21 1917 11/06/21 0452 11/06/21 0525 11/06/21 1418  ?BP: (!) 167/72 (!) 167/76  (!) 169/67  ?Pulse: 86 91  80  ?Resp: 18 18  16   ?Temp: 98.5 ?F (36.9 ?C) 98.2 ?F (36.8 ?C)  98.4 ?F (36.9 ?C)  ?TempSrc: Oral Oral  Oral  ?SpO2: 100% 100%  100%  ?Weight:   76 kg   ?Height:      ? ?Physical Exam ?General:chronically ill appearing female in NAD ?Heart:RRR, no mrg ?Lungs:CTAB, nml WOB ?Abdomen:soft, NTND ?Extremities:no LE edema, R leg in brace ?Dialysis Access: [facility]  ? ?Dialysis Orders: ?MWF [place] [phone]) ? 3h 29min  81kg   Hep 3000 then 800u/hr  RIJ TDC ?   - Hep B SAg neg 2/8, covid neg 1/29 ?  ?Assessment/Plan: ?Debility - rehab per CIR ?RLE tendon rupture - s/p surgery 2/16 here ?L hip fracture/ R pelvic ramus fx - sp L hemiarthroplasty on 09/29/21 at [facility], then sent here for CIR ?ESRD - on HD MWF. Next HD 3/10.  ?BP/ volume - increased cozaar to 100 mg daily. Lower dry wt at dc.  ?DM2 - per pmd ?Anemia ckd - Hgb trending down, last 8.7. S/p 1 unit prbc on 2/3.  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  Continue binders.  ?9. Nutrition - renal diet ?10.  Dispo: 3/11 discharge ? ?[name], MD ?11/04/2021, 3:34 PM ?Filed Weights  ? 11/05/21 1323 11/05/21 1615 11/06/21 0525  ?Weight: 77.5 kg 76 kg 76 kg  ? ? ?Intake/Output Summary (Last 24 hours) at 11/06/2021 1514 ?Last data filed at 11/06/2021 1334 ?Gross per 24 hour  ?Intake 240 ml  ?Output 1501 ml  ?Net -1261 ml  ? ? ? ?Additional Objective ?Labs: ?Basic Metabolic Panel: ?Recent Labs  ?Lab 10/31/21 ?7829 11/03/21 ?1332 11/05/21 ?1338  ?NA  --  133* 140  ?K  --  5.7* 5.3*  ?CL  --  95* 100  ?CO2  --  22 25  ?GLUCOSE  --  127* 172*  ?BUN  --  51* 45*  ?CREATININE 5.22* 6.97* 5.70*  ?CALCIUM  --  9.3 9.2  ?PHOS  --  5.2* 5.4*  ? ? ?Liver Function Tests: ?Recent Labs  ?Lab 10/31/21 ?5621  11/03/21 ?1332 11/05/21 ?1338  ?AST 16  --   --   ?ALT 9  --   --   ?ALKPHOS 719*  --   --   ?BILITOT 0.9  --   --   ?PROT 6.2*  --   --   ?ALBUMIN 3.3* 3.6 3.4*  ? ? ?CBC: ?Recent Labs  ?Lab 11/03/21 ?1331 11/05/21 ?1200  ?WBC 6.1 6.0  ?NEUTROABS 4.6  --   ?HGB 9.1* 9.2*  ?HCT 28.9* 28.0*  ?MCV 103.2* 102.9*  ?PLT 222 221  ? ? ?Medications: ? sodium chloride    ? ? acetaminophen  650 mg Oral TID  ? cephALEXin  500 mg Oral Q12H  ? Chlorhexidine Gluconate Cloth  6 each Topical Q0600  ? Chlorhexidine Gluconate Cloth  6 each Topical Q0600  ? darbepoetin (ARANESP) injection - DIALYSIS  60 mcg Intravenous Q Fri-HD  ? diclofenac Sodium  2 g Topical TID  ? enoxaparin (LOVENOX) injection  30 mg Subcutaneous Q24H  ? insulin aspart  0-9 Units Subcutaneous TID WC  ? insulin glargine-yfgn  20  Units Subcutaneous Daily  ? losartan  75 mg Oral Daily  ? polymixin-bacitracin   Topical BID  ? Vitamin D (Ergocalciferol)  50,000 Units Oral Q7 days  ? ? ? ? ? ? ? ? ? ?

## 2021-11-06 NOTE — Progress Notes (Signed)
Physical Therapy Session Note ? ?Patient Details  ?Name: [name] ?MRN: [id number] ?Date of Birth: 29-Oct-1965 ? ?Today's Date: 11/06/2021 ?PT Individual Time: 7412-8786 ?PT Individual Time Calculation (min): 55 min  ? ?Short Term Goals: ?Week 3:  PT Short Term Goal 1 (Week 3): STG=LTG due to LOS ? ?Skilled Therapeutic Interventions/Progress Updates:  ?  Patient received sitting up in bed, agreeable to PT. She denies pain. Patient requesting to complete seated ROM to R knee. Per protocol, able to actively flex knee to 30*, but requires passive extension to 0*. X10 repetitions of this, with 5 in prolonged stretch at 30* per patient request. PT discussing scar management upon healing of scar to assist in optimizing ROM at knee. PT also emphasizing need for OP follow up after finishing HH therapies/nursing. Provided patient with OP clinic phone numbers by her home. Discussed ant hip precautions with patient- patient stating "I'll never bring my leg out to the side again." PT discussing need for healthy movement in joints and not developing fear avoidance behavior. Patient receptive to education. Patient agreeable to transfer to recliner. Slideboard transfer with supervision. Patient remaining up in chair, needs within reach.  ? ?Therapy Documentation ?Precautions:  ?Precautions ?Precautions: Fall, Anterior Hip, Other (comment) ?Precaution Comments: HD access port RUQ, L LE anterior hip precautions, R LE rehab protocol in pt's paper chart ?Required Braces or Orthoses: Knee Immobilizer - Right ?Knee Immobilizer - Right: On at all times, Other (comment) ?Restrictions ?Weight Bearing Restrictions: (P) Yes ?RLE Weight Bearing: (P) Touchdown weight bearing ?LLE Weight Bearing: (P) Weight bearing as tolerated ?Other Position/Activity Restrictions: L LE anterior hip precautions - no hip extension, hip external rotation, or abduction ? ? ? ?Therapy/Group: Individual Therapy ? ?Debbora Dus ?Debbora Dus, PT, DPT,  CBIS ? ?11/06/2021, 7:31 AM  ?

## 2021-11-06 NOTE — Progress Notes (Signed)
Inpatient Rehabilitation Discharge Medication Review by a Pharmacist ? ?A complete drug regimen review was completed for this patient to identify any potential clinically significant medication issues. ? ?High Risk Drug Classes Is patient taking? Indication by Medication  ?Antipsychotic No   ?Anticoagulant No   ?Antibiotic Yes PO keflex for UTI  ?Opioid Yes Vicodin prn pain  ?Antiplatelet No   ?Hypoglycemics/insulin Yes Semglee for DM  ?Vasoactive Medication Yes Losartan for BP  ?Chemotherapy No   ?Other Yes Robaxin prn for muscle spasms ?Aranesp with HD for anemia  ? ? ? ?Type of Medication Issue Identified Description of Issue Recommendation(s)  ?Drug Interaction(s) (clinically significant) ?    ?Duplicate Therapy ?    ?Allergy ?    ?No Medication Administration End Date ?    ?Incorrect Dose ?    ?Additional Drug Therapy Needed ?    ?Significant med changes from prior encounter (inform family/care partners about these prior to discharge).    ?Other ?    ? ? ?Clinically significant medication issues were identified that warrant physician communication and completion of prescribed/recommended actions by midnight of the next day:  No ? ?Pharmacist comments: None ? ?Time spent performing this drug regimen review (minutes):  20 minutes ? ? ?Tad Moore ?11/06/2021 9:38 AM ?

## 2021-11-07 LAB — CBC
HCT: 27.5 % — ABNORMAL LOW (ref 36.0–46.0)
Hemoglobin: 8.7 g/dL — ABNORMAL LOW (ref 12.0–15.0)
MCH: 32.8 pg (ref 26.0–34.0)
MCHC: 31.6 g/dL (ref 30.0–36.0)
MCV: 103.8 fL — ABNORMAL HIGH (ref 80.0–100.0)
Platelets: 199 10*3/uL (ref 150–400)
RBC: 2.65 MIL/uL — ABNORMAL LOW (ref 3.87–5.11)
RDW: 16.1 % — ABNORMAL HIGH (ref 11.5–15.5)
WBC: 5.3 10*3/uL (ref 4.0–10.5)
nRBC: 0 % (ref 0.0–0.2)

## 2021-11-07 LAB — RENAL FUNCTION PANEL
Albumin: 3.5 g/dL (ref 3.5–5.0)
Anion gap: 16 — ABNORMAL HIGH (ref 5–15)
BUN: 43 mg/dL — ABNORMAL HIGH (ref 6–20)
CO2: 22 mmol/L (ref 22–32)
Calcium: 9.3 mg/dL (ref 8.9–10.3)
Chloride: 99 mmol/L (ref 98–111)
Creatinine, Ser: 5.81 mg/dL — ABNORMAL HIGH (ref 0.44–1.00)
GFR, Estimated: 8 mL/min — ABNORMAL LOW (ref >60)
Glucose, Bld: 155 mg/dL — ABNORMAL HIGH (ref 70–99)
Phosphorus: 5.7 mg/dL — ABNORMAL HIGH (ref 2.5–4.6)
Potassium: 5.3 mmol/L — ABNORMAL HIGH (ref 3.5–5.1)
Sodium: 137 mmol/L (ref 135–145)

## 2021-11-07 LAB — CREATININE, SERUM
Creatinine, Ser: 5.6 mg/dL — ABNORMAL HIGH (ref 0.44–1.00)
GFR, Estimated: 8 mL/min — ABNORMAL LOW (ref >60)

## 2021-11-07 LAB — GLUCOSE, CAPILLARY
Glucose-Capillary: 103 mg/dL — ABNORMAL HIGH (ref 70–99)
Glucose-Capillary: 120 mg/dL — ABNORMAL HIGH (ref 70–99)
Glucose-Capillary: 94 mg/dL (ref 70–99)
Glucose-Capillary: 212 mg/dL — ABNORMAL HIGH (ref 70–99)

## 2021-11-07 MED ORDER — VITAMIN D (ERGOCALCIFEROL) 1.25 MG (50000 UNIT) PO CAPS: 50000.0000 [IU] | ORAL_CAPSULE | ORAL | 0 refills | Status: DC

## 2021-11-07 MED ORDER — RENA-VITE PO TABS: 1.0000 | ORAL_TABLET | Freq: Every day | ORAL | 0 refills | Status: DC

## 2021-11-07 MED ORDER — LOSARTAN POTASSIUM 25 MG PO TABS: 75.0000 mg | ORAL_TABLET | Freq: Every day | ORAL | 0 refills | Status: DC

## 2021-11-07 MED ORDER — DICLOFENAC SODIUM 1 % EX GEL: 2.0000 g | Freq: Three times a day (TID) | CUTANEOUS | 0 refills | Status: DC

## 2021-11-07 MED ORDER — ACETAMINOPHEN 325 MG PO TABS: 650.0000 mg | ORAL_TABLET | Freq: Three times a day (TID) | ORAL | Status: DC

## 2021-11-07 MED ORDER — METHOCARBAMOL 750 MG PO TABS: 750.0000 mg | ORAL_TABLET | Freq: Three times a day (TID) | ORAL | 0 refills | Status: DC | PRN

## 2021-11-07 MED ORDER — HEPARIN SODIUM (PORCINE) 1000 UNIT/ML IJ SOLN
INTRAMUSCULAR | Status: AC
  Administered 2021-11-07: 1000 [IU]
  Filled 2021-11-07: qty 4

## 2021-11-07 MED ORDER — CEPHALEXIN 500 MG PO CAPS: 500.0000 mg | ORAL_CAPSULE | Freq: Two times a day (BID) | ORAL | 0 refills | Status: DC

## 2021-11-07 MED ORDER — INSULIN GLARGINE 100 UNIT/ML SOLOSTAR PEN: 20.0000 [IU] | PEN_INJECTOR | Freq: Every day | SUBCUTANEOUS | 11 refills | Status: DC

## 2021-11-07 MED ORDER — DOUBLE ANTIBIOTIC 500-10000 UNIT/GM EX OINT: 1.0000 "application " | TOPICAL_OINTMENT | Freq: Two times a day (BID) | CUTANEOUS | 0 refills | Status: DC

## 2021-11-07 MED ORDER — DARBEPOETIN ALFA 60 MCG/0.3ML IJ SOSY: 60.0000 ug | PREFILLED_SYRINGE | INTRAMUSCULAR | Status: DC

## 2021-11-07 MED ORDER — HYDROCODONE-ACETAMINOPHEN 5-325 MG PO TABS: 1.0000 | ORAL_TABLET | ORAL | 0 refills | Status: DC | PRN

## 2021-11-07 NOTE — Progress Notes (Signed)
Physical Therapy Discharge Summary ? ?Patient Details  ?Name: [name] ?MRN: [id number] ?Date of Birth: [birth year] ? ?Today's Date: 11/07/2021 ?PT Individual Time: 0626-9485 ?PT Individual Time Calculation (min): 57 min  ? ?Patient has met 7 of 8 long term goals due to improved activity tolerance, improved balance, improved postural control, increased strength, increased range of motion, decreased pain, ability to compensate for deficits, and improved coordination.  Patient to discharge at a wheelchair level Modified Independent.  Patient's care partner is independent to provide the necessary physical assistance at discharge. Pt's husband, Aaron Edelman, attended family education training on 3/7 and verbalized and demonstrated confidence with all tasks to ensure safe discharge home.  ? ?Reasons goals not met: Pt did not meet dynamic standing goal of min A as pt was unable to safety perform stand<>pivot or begin gait training during rehab stay due to pain, weakness, and altered balance strategies.  ? ?Recommendation:  ?Patient will benefit from ongoing skilled PT services in home health setting to continue to advance safe functional mobility, address ongoing impairments in transfers, generalized strengthening and ROM, dynamic standing balance/coordination, gait training, and to minimize fall risk. ? ?Equipment: ?20x16 manual WC with R elevating legrest, slideboard ? ?Reasons for discharge: treatment goals met ? ?Patient/family agrees with progress made and goals achieved: Yes ? ?Today's Interventions: ?Received pt sitting in [facility] chatting with MD. Pt's WC delivered but no cushion or anti-tippers - pt/husband already called [employer] and planning on having items delivered to house. Pt agreeable to PT treatment and did not report any pain at start of session but reported increased L heel pain with standing (premedicated). Session with emphasis on discharge planning, functional mobility/transfers, generalized strengthening and  endurance, and dynamic standing balance/coordination. Pt transported to/from room in [facility] dependently for time management purposes. Pt transferred WC<>mat via slideboard mod I (including placing board) and attempted sit<>stands with RW and Airex under RLE with heavy max A x 7 reps but pt ultimately unable to come to complete stand due to difficulty transitioning UE from mat<>RW handgrip. Provided pt with Harmon Pier walker and pt transferred sit<>stand from 23in high mat with Harmon Pier walker and mod A x 1 rep but then reported increased pain in L heel and unable to stand again. Pt transferred mat<>WC via slideboard with mod I placing more weight on L forefoot > heel due to pain and requested to return to bed. Pt transferred WC<>bed via slideboard mod I and sit<>supine mod I. Concluded session with pt semi-reclined in bed with all needs within reach. Provided pt with ice pack for L heel.  ? ?PT Discharge ?Precautions/Restrictions ?Precautions ?Precautions: Fall;Anterior Hip;Other (comment) ?Precaution Comments: HD access port RUQ, L LE anterior hip precautions, R LE rehab protocol in pt's paper chart ?Required Braces or Orthoses: Knee Immobilizer - Right ?Knee Immobilizer - Right: On at all times;Other (comment) ?Restrictions ?Weight Bearing Restrictions: Yes ?RLE Weight Bearing: Partial weight bearing ?LLE Weight Bearing: Weight bearing as tolerated ?Other Position/Activity Restrictions: L LE anterior hip precautions - no hip extension, hip external rotation, or abduction ?Pain Interference ?Pain Interference ?Pain Effect on Sleep: 0. Does not apply - I have not had any pain or hurting in the past 5 days ?Pain Interference with Therapy Activities: 0. Does not apply - I have not received rehabilitationtherapy in the past 5 days ?Pain Interference with Day-to-Day Activities: 1. Rarely or not at all ?Cognition ?Overall Cognitive Status: Within Functional Limits for tasks assessed ?Arousal/Alertness: Awake/alert ?Orientation Level:  Oriented X4 ?Memory: Appears intact ?Awareness:  Appears intact ?Safety/Judgment: Appears intact ?Sensation ?Sensation ?Light Touch: Appears Intact ?Hot/Cold: Not tested ?Proprioception: Appears Intact ?Stereognosis: Not tested ?Coordination ?Gross Motor Movements are Fluid and Coordinated: Yes ?Fine Motor Movements are Fluid and Coordinated: Yes ?Coordination and Movement Description: generalized weakness/deconditioning and altered balance strategies due to RLE PWB precautions ?Finger Nose Finger Test: [facility] bilaterally ?Heel Shin Test: decreased ROM on LLE and unable to perform on RLE due to bledsoe brace locked in extension ?Motor  ?Motor ?Motor: Within Functional Limits ?Motor - Skilled Clinical Observations: generalized weakness/deconditioning and altered balance strategies due to RLE PWB precautions.  ?Mobility ?Bed Mobility ?Bed Mobility: Rolling Right;Rolling Left;Sit to Supine;Supine to Sit ?Rolling Right: Independent with assistive device ?Rolling Left: Independent with assistive device ?Supine to Sit: Independent with assistive device ?Sit to Supine: Independent with assistive device ?Transfers ?Transfers: Lateral/Scoot Transfers (slideboard) ?Sit to Stand: Supervision/Verbal cueing (from extremly elevated mat with Harmon Pier walker) ?Stand to Sit: Supervision/Verbal cueing (from extremly elevated mat with Harmon Pier walker) ?Lateral/Scoot Transfers: Independent with assistive device ?Transfer (Assistive device): Other (Comment) (slideboard) ?Locomotion  ?Gait ?Ambulation: No ?Gait ?Gait: No ?Stairs / Additional Locomotion ?Stairs: No ?Wheelchair Mobility ?Wheelchair Mobility: Yes ?Wheelchair Assistance: Independent with assistive device ?Wheelchair Propulsion: Both upper extremities ?Wheelchair Parts Management: Needs assistance ?Distance: >154ft  ?Trunk/Postural Assessment  ?Cervical Assessment ?Cervical Assessment: Exceptions to [facility] (forward head) ?Thoracic Assessment ?Thoracic Assessment: Exceptions to [facility] (rounded  shoulders) ?Lumbar Assessment ?Lumbar Assessment: Exceptions to [facility] (posterior pelvic tilt) ?Postural Control ?Postural Control: Within Functional Limits  ?Balance ?Balance ?Balance Assessed: Yes ?Static Sitting Balance ?Static Sitting - Balance Support: Feet supported;No upper extremity supported ?Static Sitting - Level of Assistance: 6: Modified independent (Device/Increase time) ?Dynamic Sitting Balance ?Dynamic Sitting - Balance Support: No upper extremity supported;Feet supported ?Dynamic Sitting - Level of Assistance: 6: Modified independent (Device/Increase time) ?Static Standing Balance ?Static Standing - Balance Support: Bilateral upper extremity supported (eva walker) ?Static Standing - Level of Assistance: 5: Stand by assistance (supervision) ?Extremity Assessment  ?RLE Assessment ?RLE Assessment: Exceptions to [facility] ?General Strength Comments: grossly 3-/5 - limited by brace but DF/PF 4/5 ?LLE Assessment ?LLE Assessment: Exceptions to [facility] ?General Strength Comments: grossly generalized to 3+/5 (DF/PF 4-/5) ? ?Blenda Nicely ?Becky Sax PT, DPT  ?11/07/2021, 7:29 AM ?

## 2021-11-07 NOTE — Progress Notes (Signed)
Occupational Therapy Session Note ? ?Patient Details  ?Name: Chonda Baney ?MRN: [id number] ?Date of Birth: 1966/03/19 ? ?Today's Date: 11/07/2021 ?OT Individual Time: 2574-9355 ?OT Individual Time Calculation (min): 23 min  ? ? ?Short Term Goals: ?Week 1:  OT Short Term Goal 1 (Week 1): Pt will complete LB dressing with Mod A using AE as needed ?OT Short Term Goal 1 - Progress (Week 1): Met ?OT Short Term Goal 2 (Week 1): Pt will complete BSC transfer with 1 assist using LRAD to promote OOB toileting ?OT Short Term Goal 2 - Progress (Week 1): Not met ?OT Short Term Goal 3 (Week 1): Pt will complete 1/3 components of toileting with supervision ?OT Short Term Goal 3 - Progress (Week 1): Met ?Week 2:  OT Short Term Goal 1 (Week 2): Pt will complete BSC transfer with 1 assist using LRAD to promote OOB toileting ?OT Short Term Goal 1 - Progress (Week 2): Met ?OT Short Term Goal 2 (Week 2): Pt will complete 2/3 components of toileting with supervision ?OT Short Term Goal 2 - Progress (Week 2): Progressing toward goal ?Week 3:  OT Short Term Goal 1 (Week 3): STG = LTG due to ELOS ? ?Skilled Therapeutic Interventions/Progress Updates:  ?Patient met semi-reclined in bed in agreement with OT treatment session. 4/10 pain reported at rest and with activity in L heel. Patient with desire to remain bed level for short treatment session. This Probation officer obliged. Patient completed BUE HEP for 2 sets x10 reps each with 4lb weighted dowel. Please refer below for additional details. Patient missed 7 min of treatment session 2/2 pain/fatigue. Session concluded with patient in semi-reclined position in bed with call bell within reach, bed alarm activated and all needs met.  ? ?BUE HEP ?Straight arm raises ?Chest press ?Shoulder press ?Arm curls  ?Single arm rows ? ?Therapy Documentation ?Precautions:  ?Precautions ?Precautions: Fall, Anterior Hip, Other (comment) ?Precaution Comments: HD access port RUQ, L LE anterior hip precautions, R LE  rehab protocol in pt's paper chart ?Required Braces or Orthoses: Knee Immobilizer - Right ?Knee Immobilizer - Right: On at all times, Other (comment) ?Restrictions ?Weight Bearing Restrictions: Yes ?RLE Weight Bearing: Partial weight bearing ?LLE Weight Bearing: Weight bearing as tolerated ?Other Position/Activity Restrictions: L LE anterior hip precautions - no hip extension, hip external rotation, or abduction ?General: ?General ?OT Amount of Missed Time: 7 Minutes ?Therapy/Group: Individual Therapy ? ?Bridie Colquhoun R Howerton-Davis ?11/07/2021, 11:37 AM ?

## 2021-11-07 NOTE — Progress Notes (Signed)
Occupational Therapy Discharge Summary ? ?Patient Details  ?Name: Malaia Buchta ?MRN: [id number] ?Date of Birth: [birth year] ? ? ?Patient has met 6 of 7 long term goals due to improved activity tolerance, improved balance, ability to compensate for deficits, improved awareness, and improved coordination.  Patient is to discharge wheelchair level with overall Modified Independence. Patient's care partner is independent to provide intermittent supervision and provide up to min A for toileting needs at discharge, with family education completed with pt's husband.   ? ?Reasons goals not met: toileting goal not met due to pt declining using BSC for functional toileting needs vs bed level/incontinence brief despite education, other than completing a simulated trial. Pt has completed toileting up to min A, which pt's husband can provide and has demonstrated efficient/safe ability to do so during family education session. ? ?Recommendation:  ?Patient will benefit from ongoing skilled OT services in home health setting to continue to advance functional skills in the area of BADL and Reduce care partner burden. ? ?Equipment: ?Padded tub bench with hole opening for toileting use per preference of pt ? ?Reasons for discharge: treatment goals met ? ?Patient/family agrees with progress made and goals achieved: Yes ? ?OT Discharge ?Precautions/Restrictions  ?Precautions ?Precautions: Fall;Anterior Hip;Other (comment) ?Precaution Comments: HD access port RUQ, L LE anterior hip precautions, R LE rehab protocol in pt's paper chart ?Required Braces or Orthoses: Knee Immobilizer - Right ?Knee Immobilizer - Right: On at all times;Other (comment) ?Restrictions ?Weight Bearing Restrictions: Yes ?RLE Weight Bearing: Partial weight bearing ?LLE Weight Bearing: Weight bearing as tolerated ?Other Position/Activity Restrictions: L LE anterior hip precautions - no hip extension, hip external rotation, or abduction ?ADL ?ADL ?Eating:  Independent ?Where Assessed-Eating: Wheelchair ?Grooming: Modified independent ?Where Assessed-Grooming: Sitting at sink ?Upper Body Bathing: Modified independent ?Where Assessed-Upper Body Bathing: Sitting at sink ?Lower Body Bathing: Modified independent ?Where Assessed-Lower Body Bathing: Edge of bed ?Upper Body Dressing: Independent ?Where Assessed-Upper Body Dressing: Wheelchair ?Lower Body Dressing: Modified independent ?Where Assessed-Lower Body Dressing: Bed level, Edge of bed ?Toileting: Minimal assistance ?Where Assessed-Toileting: Bedside Commode ?Toilet Transfer: Modified independent ?Toilet Transfer Method: Transfer board ?Science writer: Extra wide drop arm bedside commode ?Tub/Shower Transfer: Not assessed ?Tub/Shower Transfer Method: Unable to assess ?Walk-In Shower Transfer: Not assessed ?Walk-In Shower Transfer Method: Unable to assess ?Vision ?Baseline Vision/History: 1 Wears glasses ?Patient Visual Report: No change from baseline ?Vision Assessment?: No apparent visual deficits ?Perception  ?Perception: Within Functional Limits ?Praxis ?Praxis: Intact ?Cognition ?Overall Cognitive Status: Within Functional Limits for tasks assessed ?Arousal/Alertness: Awake/alert ?Orientation Level: Oriented X4 ?Year: 2023 ?Month: March ?Day of Week: Correct ?Focused Attention: Appears intact ?Sustained Attention: Appears intact ?Selective Attention: Appears intact ?Memory: Appears intact ?Immediate Memory Recall: Sock;Blue;Bed ?Memory Recall Sock: Without Cue ?Memory Recall Blue: Without Cue ?Memory Recall Bed: Without Cue ?Awareness: Appears intact ?Problem Solving: Appears intact ?Safety/Judgment: Appears intact ?Sensation ?Sensation ?Light Touch: Appears Intact ?Hot/Cold: Not tested ?Proprioception: Appears Intact ?Stereognosis: Not tested ?Coordination ?Gross Motor Movements are Fluid and Coordinated: Yes ?Fine Motor Movements are Fluid and Coordinated: Yes ?Finger Nose Finger Test: [facility]  bilaterally ?Motor  ?Motor ?Motor: Within Functional Limits ?Motor - Skilled Clinical Observations: generalized weakness/deconditioning and altered balance strategies due to RLE PWB precautions. ?Mobility  ?Bed Mobility ?Bed Mobility: Rolling Right;Rolling Left;Sit to Supine;Supine to Sit ?Rolling Right: Independent with assistive device ?Rolling Left: Independent with assistive device ?Supine to Sit: Independent with assistive device ?Sit to Supine: Independent with assistive device ?Transfers ?Sit to Stand: Supervision/Verbal cueing (from extremly  elevated mat with Harmon Pier walker) ?Stand to Sit: Supervision/Verbal cueing (from extremly elevated mat with [place]on Pier walker)  ?Trunk/Postural Assessment  ?Cervical Assessment ?Cervical Assessment: Exceptions to [facility] (forward head) ?Thoracic Assessment ?Thoracic Assessment: Exceptions to [facility] (rounded shoulders) ?Lumbar Assessment ?Lumbar Assessment: Exceptions to [facility] (posterior pelvic tilt) ?Postural Control ?Postural Control: Within Functional Limits  ?Balance ?Balance ?Balance Assessed: Yes ?Static Sitting Balance ?Static Sitting - Balance Support: Feet supported;No upper extremity supported ?Static Sitting - Level of Assistance: 6: Modified independent (Device/Increase time) ?Dynamic Sitting Balance ?Dynamic Sitting - Balance Support: No upper extremity supported;Feet supported ?Dynamic Sitting - Level of Assistance: 6: Modified independent (Device/Increase time) ?Static Standing Balance ?Static Standing - Balance Support: Bilateral upper extremity supported (eva walker) ?Static Standing - Level of Assistance: 5: Stand by assistance (supervision) ?Extremity/Trunk Assessment ?RUE Assessment ?RUE Assessment: Within Functional Limits ?Active Range of Motion (AROM) Comments: WNL ?LUE Assessment ?LUE Assessment: Within Functional Limits ?Active Range of Motion (AROM) Comments: WNL ? ? ?Jannely Henthorn E Albert Hersch ?11/07/2021, 7:53 AM ?

## 2021-11-07 NOTE — Progress Notes (Signed)
PROGRESS NOTE   Subjective/Complaints: Wound stable- she is applying topical bacitracin and getting oral abx. Discussed her lifewave patches and essential oils, plan for d/c tomorrow.   ROS: Patient denies fever, rash, sore throat, blurred vision, dizziness, nausea, vomiting, diarrhea, cough, shortness of breath or chest pain, headache, or mood change. +right knee pain with stretching  Objective:   No results found. Recent Labs    11/05/21 1200  WBC 6.0  HGB 9.2*  HCT 28.0*  PLT 221        Recent Labs    11/05/21 1338 11/07/21 0646  NA 140  --   K 5.3*  --   CL 100  --   CO2 25  --   GLUCOSE 172*  --   BUN 45*  --   CREATININE 5.70* 5.60*  CALCIUM 9.2  --        Intake/Output Summary (Last 24 hours) at 11/07/2021 1027 Last data filed at 11/06/2021 1334 Gross per 24 hour  Intake 120 ml  Output --  Net 120 ml         Physical Exam: Vital Signs Blood pressure (!) 169/71, pulse 95, temperature 97.9 F (36.6 C), temperature source Oral, resp. rate 18, height 5\' 7"  (1.702 m), weight 76 kg, SpO2 97 %. Gen: no distress, normal appearing, BMI 26.24 HEENT: oral mucosa pink and moist, NCAT Cardio: Reg rate Chest: normal effort, normal rate of breathing Abd: soft, non-distended Ext: no edema Psych: pleasant, normal affect Skin:    Slightly increased erythema and yellowing of left hip incision. 2 lifewave patches placed underneath Musculoskeletal:        General: Decreased Swelling and tenderness present.     Cervical back: Normal range of motion and neck supple.     Right lower leg:  In brace         Comments: Left hip tender to palpation and PROM with associated swelling. Left knee with mild effusion, patella laxity. Right hip tender with leg raise  Right knee still with swelling Left foot with tenderness over metatarsals in particular Skin:    Comments: Incision site dressing clean dry and intact  with post-op dressing in place.  R IJ TDC with bandage Neuro: Alert and oriented x 3. Normal insight and awareness. Intact Memory. Normal language and speech. Cranial nerve exam unremarkable  Motor: Bilateral upper extremities: 5/5 proximal distal Bilateral lower extremities:  Right lower extremity: Limited by bracing, hip flexion?  2/5   Assessment/Plan: 1. Functional deficits which require 3+ hours per day of interdisciplinary therapy in a comprehensive inpatient rehab setting. Physiatrist is providing close team supervision and 24 hour management of active medical problems listed below. Physiatrist and rehab team continue to assess barriers to discharge/monitor patient progress toward functional and medical goals  Care Tool:  Bathing    Body parts bathed by patient: Right arm, Left arm, Chest, Abdomen, Front perineal area, Right upper leg, Left upper leg, Right lower leg, Left lower leg, Face, Buttocks   Body parts bathed by helper: Left lower leg Body parts n/a: Right lower leg   Bathing assist Assist Level: Independent with assistive device     Upper Body Dressing/Undressing  Upper body dressing   What is the patient wearing?: Pull over shirt    Upper body assist Assist Level: Independent with assistive device    Lower Body Dressing/Undressing Lower body dressing      What is the patient wearing?: Incontinence brief, Pants     Lower body assist Assist for lower body dressing: Independent with assitive device (bed level per preference)     Toileting Toileting Toileting Activity did not occur (Clothing management and hygiene only): N/A (no void or bm)  Toileting assist Assist for toileting: Minimal Assistance - Patient > 75%     Transfers Chair/bed transfer  Transfers assist     Chair/bed transfer assist level: Independent with assistive device Chair/bed transfer assistive device: Sliding board   Locomotion Ambulation   Ambulation assist   Ambulation  activity did not occur: Safety/medical concerns (fatigue, weakness, pain, RLE PWB precautions)          Walk 10 feet activity   Assist  Walk 10 feet activity did not occur: Safety/medical concerns (fatigue, weakness, pain, RLE PWB precautions)        Walk 50 feet activity   Assist Walk 50 feet with 2 turns activity did not occur: Safety/medical concerns (fatigue, weakness, pain, RLE PWB precautions)         Walk 150 feet activity   Assist Walk 150 feet activity did not occur: Safety/medical concerns (fatigue, weakness, pain, RLE PWB precautions)         Walk 10 feet on uneven surface  activity   Assist Walk 10 feet on uneven surfaces activity did not occur: Safety/medical concerns (fatigue, weakness, pain, RLE PWB precautions)         Wheelchair     Assist Is the patient using a wheelchair?: Yes Type of Wheelchair: Manual    Wheelchair assist level: Independent Max wheelchair distance: >167ft    Wheelchair 50 feet with 2 turns activity    Assist        Assist Level: Independent   Wheelchair 150 feet activity     Assist      Assist Level: Independent   Blood pressure (!) 169/71, pulse 95, temperature 97.9 F (36.6 C), temperature source Oral, resp. rate 18, height 5\' 7"  (1.702 m), weight 76 kg, SpO2 97 %.    Medical Problem List and Plan: 1. Functional deficits secondary to nondisplaced fracture right inferior pubic ramus as well as a displaced foreshortened fracture through the left femoral neck/intertrochanteric area.  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   -Weightbearing as tolerated with anterior total precautions.                                  -Conservative care right inferior pubic ramus fracture, weightbearing as tolerated             -patient may  shower             -ELOS/Goals: 22 days total, supervision to min assist goals  Continue CIR therapies including PT, OT  -3/8 communicated with ortho,  found post-op therapy directions.   -0-30 degrees flexion weeks 3-4   -0-60 degrees weeks 5-6  -can leave incision open to air but patient prefers dressing for comfort.   HFU scheduled.  -discussed essential oils, lifewave patches, plan for d/c tomorrow 2.  Impaired mobility -DVT/anticoagulation:  Mechanical: Antiembolism stockings, thigh (TED hose) Bilateral lower extremities. Vascular ultrasound reviewed and negative for clot.  -  should be able to begin lovenox today as permacath placed            -antiplatelet therapy: N/A 3. Femur fracture pain: continue Flector patch, Robaxin 750 mg 4 times daily, hydrocodone and oxycodone as needed             -added kpad to help with spasms  -schedule tylenol 1000mg  TID  Provided list of foods that can help with pain 4. Anxiety: Instructed on deep breathing techniques. Provide emotional support.  Melatonin as needed. Neuropsych eval 3/6. Provided list of foods that can help with anxiety 5. Neuropsych: This patient is capable of making decisions on her own behalf. 6. Left hip with small area of wound dehiscence, yellowing of base: WBC normal, afebrile. Discussed not likely infection but could use antibiotic prophylactically- she prefers topical abx- bacitracin ordered BID. Surgery contacted. Start Keflex 500mg  BID prophylactically given increase in size.  7. Fluids/Electrolytes/Nutrition: Routine in and outs with follow-up chemistries 8.  ESRD on HD.  Permacath exchanged 10/03/2021.    -  Follow-up hemodialysis per Dr. [name] -Messaged therapy to see if we can practice transfers from inclined chair as she is nervous about this transfer during outpatient dialysis,  -HD later in day to allow participation in therapies during the day Appreciate nephro recs Discussed her efforts to get HD at home, her coversations with Dr. Zollie Scale.  9.  Acute on chronic anemia.  Has had transfusions already x 2. Hgb has stabilized in 7 range. continue iron supplement.               -epo per nephrology  Hemoglobin 8.7 on 2/24, appreciate nephro recs 10.  Diabetes mellitus with peripheral neuropathy. Placed order that she may use her Dexcom 6. Hemoglobin A1c 6.2.  SSI as prior to admission. Provide dietary education.  CBG (last 3)  Recent Labs    11/06/21 1630 11/06/21 2104 11/07/21 0604  GLUCAP 118* 149* 103*  Some lability will monitor. Continue Semglee to 20U, discussed plan to transition back to her novolog at home 11.  Hypertension. Decrease Cozaar to 75mg  to allow more fluid to be removed with HD Vitals:   11/06/21 1928 11/07/21 0438  BP: (!) 169/84 (!) 169/71  Pulse: 74 95  Resp: 18 18  Temp: 98.4 F (36.9 C) 97.9 F (36.6 C)  SpO2: 100% 97%   Mildly elevated, avoid overtreatment due to dialysis 12.  Hyperparathyroidism of renal origin.  Plan outpatient parathyroidectomy per Dr.Lateef 13.  Overweight.  BMI 29.11-->26.35  Dietary follow-up. Provided list of foods that can help with weight loss. Discussed benefits of intermittent fasting.  14. Cushingoid?--outpt work up 6. Constipation: resolved. Decrease colace to daily prn. D/c senna. Discussed benefits of high fiber foods. Continue bowel regimen. D/c miralax.  16. Right quadriceps tear: surgery complete with good healing on XR, minimal postop pain, discussed importance of participating in 3 hours therapy daily as much as she can tolerate and she is very motivated and excited for rehab, remove dressing today  -ROM as above 17. Gas: continue simethicone prn 18. Ileus: resolved, regular diet resumed -tolerating.  19. Abd spasms: resolved 20. Black tarry stools: discussed heme occult negative last night. Hgb reviewed and stable.  21. Vitamin D deficiency: continue ergocalciferol 50,000U once per week for 7 weeks. Discussed that this dose is higher than a daily supplement.  22. Left heel pain and. Left foot pain over metatarsals-  3/5 MRI was reviewed with patient. There is associated atrophy of the  forefoot muscles likely associated with her diabetes but no signs of fx, inflammation, etc.   -recommend appropriate shoewear to support arch and metatarsals  -added voltaren gel to help with pain control.  -stretching of arch/heel cord with therapies      LOS: 21 days A FACE TO FACE EVALUATION WAS PERFORMED  Martha Clan P Shuaib Corsino 11/07/2021, 10:27 AM

## 2021-11-07 NOTE — Progress Notes (Signed)
?  [facility] ?Progress Note  ? ?Subjective:   seen in the gym ? ?Objective ?Vitals:  ? 11/06/21 1418 11/06/21 1928 11/07/21 0438 11/07/21 0509  ?BP: (!) 169/67 (!) 169/84 (!) 169/71   ?Pulse: 80 74 95   ?Resp: 16 18 18    ?Temp: 98.4 ?F (36.9 ?C) 98.4 ?F (36.9 ?C) 97.9 ?F (36.6 ?C)   ?TempSrc: Oral Oral Oral   ?SpO2: 100% 100% 97%   ?Weight:    76 kg  ?Height:      ? ?Physical Exam ?General:chronically ill appearing female in NAD ?Heart:RRR, no mrg ?Lungs:CTAB, nml WOB ?Abdomen:soft, NTND ?Extremities:no LE edema, R leg in brace ?Dialysis Access: [facility]  ? ?Dialysis Orders: ?MWF [place] [phone]) ? 3h 103min  81kg   Hep 3000 then 800u/hr  RIJ TDC ?   - Hep B SAg neg 2/8, covid neg 1/29 ?  ?Assessment/Plan: ?Debility - rehab per CIR ?RLE tendon rupture - s/p surgery 2/16 here ?L hip fracture/ R pelvic ramus fx - sp L hemiarthroplasty on 09/29/21 at [facility], then sent here for CIR ?ESRD - on HD MWF. Next HD today.  ?BP/ volume - increased cozaar to 100 mg daily. 5 kg below dry wt, will lower dry wt at dc.  ?DM2 - per pmd ?Anemia ckd - Hgb trending down, last 8.7. S/p 1 unit prbc on 2/3.  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  Continue binders.  ?9. Nutrition - renal diet ?10.  Dispo: 3/11 discharge ? ?[name], MD ?11/04/2021, 3:34 PM ?Filed Weights  ? 11/05/21 1615 11/06/21 0525 11/07/21 0509  ?Weight: 76 kg 76 kg 76 kg  ? ? ?Intake/Output Summary (Last 24 hours) at 11/07/2021 1219 ?Last data filed at 11/06/2021 1334 ?Gross per 24 hour  ?Intake 120 ml  ?Output --  ?Net 120 ml  ? ? ? ?Additional Objective ?Labs: ?Basic Metabolic Panel: ?Recent Labs  ?Lab 11/03/21 ?1332 11/05/21 ?1338 11/07/21 ?6546  ?NA 133* 140  --   ?K 5.7* 5.3*  --   ?CL 95* 100  --   ?CO2 22 25  --   ?GLUCOSE 127* 172*  --   ?BUN 51* 45*  --   ?CREATININE 6.97* 5.70* 5.60*  ?CALCIUM 9.3 9.2  --   ?PHOS 5.2* 5.4*  --   ? ? ?Liver Function Tests: ?Recent Labs  ?Lab  11/03/21 ?1332 11/05/21 ?1338  ?ALBUMIN 3.6 3.4*  ? ? ?CBC: ?Recent Labs  ?Lab 11/03/21 ?1331 11/05/21 ?1200  ?WBC 6.1 6.0  ?NEUTROABS 4.6  --   ?HGB 9.1* 9.2*  ?HCT 28.9* 28.0*  ?MCV 103.2* 102.9*  ?PLT 222 221  ? ? ?Medications: ? sodium chloride    ? ? acetaminophen  650 mg Oral TID  ? cephALEXin  500 mg Oral Q12H  ? Chlorhexidine Gluconate Cloth  6 each Topical Q0600  ? Chlorhexidine Gluconate Cloth  6 each Topical Q0600  ? darbepoetin (ARANESP) injection - DIALYSIS  60 mcg Intravenous Q Fri-HD  ? diclofenac Sodium  2 g Topical TID  ? enoxaparin (LOVENOX) injection  30 mg Subcutaneous Q24H  ? insulin aspart  0-9 Units Subcutaneous TID WC  ? insulin glargine-yfgn  20 Units Subcutaneous Daily  ? losartan  75 mg Oral Daily  ? polymixin-bacitracin   Topical BID  ? Vitamin D (Ergocalciferol)  50,000 Units Oral Q7 days  ? ? ? ? ? ? ? ? ? ?

## 2021-11-07 NOTE — Progress Notes (Signed)
Occupational Therapy Session Note ? ?Patient Details  ?Name: [name] ?MRN: [id number] ?Date of Birth: August 04, 1966 ? ?Today's Date: 11/07/2021 ?OT Individual Time: 9935-7017 ?OT Individual Time Calculation (min): 56 min  ? ? ?Short Term Goals: ?Week 3:  OT Short Term Goal 1 (Week 3): STG = LTG due to ELOS ? ?Skilled Therapeutic Interventions/Progress Updates:  ?Skilled OT intervention completed with focus on d/c planning, discussion of rehab goals/POC, functional transfers and self-care. Pt received upright in bed, requesting to complete hair washing and grooming this session. Pt completed bed mobility with mod I, then slideboard placement and transfer from EOB > w/c with mod I. Self-propulsion and positioning in front of sink completed with independence. Therapist utilized shower tray to rinse pt's hair with total A, however pt applying hair products with set up A. Seated at sink, pt completed facial grooming/cosmetics, hair drying/styling with mod I. Pt reporting she ordered her shower tray and AE items that were used in OT sessions for maximizing independence. Total A for donning pt's shoe due to time. Pt was left seated in w/c, with chair alarm on and all needs in reach at end of session. ? ? ?Therapy Documentation ?Precautions:  ?Precautions ?Precautions: Fall, Anterior Hip, Other (comment) ?Precaution Comments: HD access port RUQ, L LE anterior hip precautions, R LE rehab protocol in pt's paper chart ?Required Braces or Orthoses: Knee Immobilizer - Right ?Knee Immobilizer - Right: On at all times, Other (comment) ?Restrictions ?Weight Bearing Restrictions: Yes ?RLE Weight Bearing: Touchdown weight bearing ?LLE Weight Bearing: Weight bearing as tolerated ?Other Position/Activity Restrictions: L LE anterior hip precautions - no hip extension, hip external rotation, or abduction ? ?Pain: ?No c/o pain ? ? ?Therapy/Group: Individual Therapy ? ?Deklynn Charlet E Brittnay Pigman ?11/07/2021, 7:21 AM ?

## 2021-11-07 NOTE — Progress Notes (Signed)
Inpatient Rehabilitation Care Coordinator ?Discharge Note DC SAT 3/11 ? ?Patient Details  ?Name: [name] ?MRN: [id number] ?Date of Birth: [birth year] ? ? ?Discharge location: [place] [facility] ? ?Length of Stay: 22 DAYS ? ?Discharge activity level: MIN ASSIST WHEELCHAIR LEVEL  ? ?Home/community participation: ACTIVE ? ?Patient response DJ:SHFWYO Literacy - How often do you need to have someone help you when you read instructions, pamphlets, or other written material from your doctor or pharmacy?: Never ? ?Patient response VZ:CHYIFO Isolation - How often do you feel lonely or isolated from those around you?: Never ? ?Services provided included: MD, RD, PT, OT, SLP, RN, CM, TR, Pharmacy, SW ? ?Financial Services:  ?Charity fundraiser Utilized: Medicare ?  ? ?Choices offered to/list presented to: PT AND HUSBAND ? ?Follow-up services arranged:  ?Home Health, DME, Patient/Family has no preference for HH/DME agencies ?Home Health Agency: [facility], OT RN  ?  ?DME : [facility]-WHEELCHAIR AND TRANSFER BOARD ?  ? ?Patient response to transportation need: ?Is the patient able to respond to transportation needs?: Yes ?In the past 12 months, has lack of transportation kept you from medical appointments or from getting medications?: No ?In the past 12 months, has lack of transportation kept you from meetings, work, or from getting things needed for daily living?: No ? ? ? ?Comments (or additional information): HUSBAND WAS IN FOR EDUCATION AND BOTH COMFORTABLE WITH CARE NEEDS ? ?Patient/Family verbalized understanding of follow-up arrangements:  Yes ? ?Individual responsible for coordination of the follow-up plan: BRIAN-HUSBAND [phone] ? ?Confirmed correct DME delivered: [name] 11/07/2021   ? ?[name] ?

## 2021-11-08 LAB — GLUCOSE, CAPILLARY: Glucose-Capillary: 115 mg/dL — ABNORMAL HIGH (ref 70–99)

## 2021-11-08 NOTE — Progress Notes (Signed)
Patient being D/C home today with husband. Denies any questions or concerns regarding going home.  ?

## 2021-11-08 NOTE — Progress Notes (Signed)
PROGRESS NOTE   Subjective/Complaints: No new complaints this morning Ready for discharge! Husband is at bedside Ramp has been built  ROS: Patient denies fever, rash, sore throat, blurred vision, dizziness, nausea, vomiting, diarrhea, cough, shortness of breath or chest pain, headache, or mood change. +right knee pain with stretching  Objective:   No results found. Recent Labs    11/05/21 1200 11/07/21 1745  WBC 6.0 5.3  HGB 9.2* 8.7*  HCT 28.0* 27.5*  PLT 221 199        Recent Labs    11/05/21 1338 11/07/21 0646 11/07/21 1745  NA 140  --  137  K 5.3*  --  5.3*  CL 100  --  99  CO2 25  --  22  GLUCOSE 172*  --  155*  BUN 45*  --  43*  CREATININE 5.70* 5.60* 5.81*  CALCIUM 9.2  --  9.3       Intake/Output Summary (Last 24 hours) at 11/08/2021 0915 Last data filed at 11/07/2021 1617 Gross per 24 hour  Intake 220 ml  Output 1500 ml  Net -1280 ml         Physical Exam: Vital Signs Blood pressure (!) 160/70, pulse 88, temperature 98.2 F (36.8 C), resp. rate 17, height 5\' 7"  (1.702 m), weight 77.4 kg, SpO2 98 %. Gen: no distress, normal appearing, BMI 26.24 HEENT: oral mucosa pink and moist, NCAT Cardio: Reg rate Chest: normal effort, normal rate of breathing Abd: soft, non-distended Ext: no edema Psych: pleasant, normal affect, bright and excited Skin:    Slightly increased erythema and yellowing of left hip incision. 2 lifewave patches placed underneath Musculoskeletal:        General: Decreased Swelling and tenderness present.     Cervical back: Normal range of motion and neck supple.     Right lower leg:  In brace         Comments: Left hip tender to palpation and PROM with associated swelling. Left knee with mild effusion, patella laxity. Right hip tender with leg raise  Right knee still with swelling Left foot with tenderness over metatarsals in particular Skin:    Comments:  Incision site dressing clean dry and intact with post-op dressing in place.  R IJ TDC with bandage Neuro: Alert and oriented x 3. Normal insight and awareness. Intact Memory. Normal language and speech. Cranial nerve exam unremarkable  Motor: Bilateral upper extremities: 5/5 proximal distal Bilateral lower extremities:  Right lower extremity: Limited by bracing, hip flexion?  2/5   Assessment/Plan: 1. Functional deficits which require 3+ hours per day of interdisciplinary therapy in a comprehensive inpatient rehab setting. Physiatrist is providing close team supervision and 24 hour management of active medical problems listed below. Physiatrist and rehab team continue to assess barriers to discharge/monitor patient progress toward functional and medical goals  Care Tool:  Bathing    Body parts bathed by patient: Right arm, Left arm, Chest, Abdomen, Front perineal area, Right upper leg, Left upper leg, Right lower leg, Left lower leg, Face, Buttocks   Body parts bathed by helper: Left lower leg Body parts n/a: Right lower leg   Bathing assist Assist Level: Independent  with assistive device     Upper Body Dressing/Undressing Upper body dressing   What is the patient wearing?: Pull over shirt    Upper body assist Assist Level: Independent with assistive device    Lower Body Dressing/Undressing Lower body dressing      What is the patient wearing?: Incontinence brief, Pants     Lower body assist Assist for lower body dressing: Independent with assitive device (bed level per preference)     Toileting Toileting Toileting Activity did not occur (Clothing management and hygiene only): N/A (no void or bm)  Toileting assist Assist for toileting: Minimal Assistance - Patient > 75%     Transfers Chair/bed transfer  Transfers assist     Chair/bed transfer assist level: Independent with assistive device Chair/bed transfer assistive device: Sliding board    Locomotion Ambulation   Ambulation assist   Ambulation activity did not occur: Safety/medical concerns (fatigue, weakness, pain, RLE PWB precautions)          Walk 10 feet activity   Assist  Walk 10 feet activity did not occur: Safety/medical concerns (fatigue, weakness, pain, RLE PWB precautions)        Walk 50 feet activity   Assist Walk 50 feet with 2 turns activity did not occur: Safety/medical concerns (fatigue, weakness, pain, RLE PWB precautions)         Walk 150 feet activity   Assist Walk 150 feet activity did not occur: Safety/medical concerns (fatigue, weakness, pain, RLE PWB precautions)         Walk 10 feet on uneven surface  activity   Assist Walk 10 feet on uneven surfaces activity did not occur: Safety/medical concerns (fatigue, weakness, pain, RLE PWB precautions)         Wheelchair     Assist Is the patient using a wheelchair?: Yes Type of Wheelchair: Manual    Wheelchair assist level: Independent Max wheelchair distance: >139ft    Wheelchair 50 feet with 2 turns activity    Assist        Assist Level: Independent   Wheelchair 150 feet activity     Assist      Assist Level: Independent   Blood pressure (!) 160/70, pulse 88, temperature 98.2 F (36.8 C), resp. rate 17, height 5\' 7"  (1.702 m), weight 77.4 kg, SpO2 98 %.    Medical Problem List and Plan: 1. Functional deficits secondary to nondisplaced fracture right inferior pubic ramus as well as a displaced foreshortened fracture through the left femoral neck/intertrochanteric area.  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   -Weightbearing as tolerated with anterior total precautions.                                  -Conservative care right inferior pubic ramus fracture, weightbearing as tolerated             -patient may  shower             -ELOS/Goals: 22 days total, supervision to min assist goals  D/c home  -3/8 communicated with  ortho, found post-op therapy directions.   -0-30 degrees flexion weeks 3-4   -0-60 degrees weeks 5-6  -can leave incision open to air but patient prefers dressing for comfort.   HFU scheduled.  -discussed essential oils, lifewave patches 2.  Impaired mobility -DVT/anticoagulation:  Mechanical: Antiembolism stockings, thigh (TED hose) Bilateral lower extremities. Vascular ultrasound reviewed and negative for clot.  -  should be able to begin lovenox today as permacath placed            -antiplatelet therapy: N/A 3. Femur fracture pain: continue Flector patch, Robaxin 750 mg 4 times daily, hydrocodone and oxycodone as needed             -added kpad to help with spasms  -schedule tylenol 1000mg  TID  Provided list of foods that can help with pain 4. Anxiety: Instructed on deep breathing techniques. Provide emotional support.  Melatonin as needed. Neuropsych eval 3/6. Provided list of foods that can help with anxiety 5. Neuropsych: This patient is capable of making decisions on her own behalf. 6. Left hip with small area of wound dehiscence, yellowing of base: WBC normal, afebrile. Discussed not likely infection but could use antibiotic prophylactically- she prefers topical abx- bacitracin ordered BID. Surgery contacted. continue Keflex 500mg  BID 7 days prophylactically given increase in size.  7. Fluids/Electrolytes/Nutrition: Routine in and outs with follow-up chemistries 8.  ESRD on HD.  Permacath exchanged 10/03/2021.    -  Follow-up hemodialysis per Dr. [name] -Messaged therapy to see if we can practice transfers from inclined chair as she is nervous about this transfer during outpatient dialysis,  -HD later in day to allow participation in therapies during the day Appreciate nephro recs Discussed her efforts to get HD at home, her coversations with Dr. Zollie Scale.  9.  Acute on chronic anemia.  Has had transfusions already x 2. Hgb has stabilized in 7 range. continue iron supplement.               -epo per nephrology  Hemoglobin 8.7 on 2/24, appreciate nephro recs 10.  Diabetes mellitus with peripheral neuropathy. Placed order that she may use her Dexcom 6. Hemoglobin A1c 6.2.  SSI as prior to admission. Provide dietary education.  CBG (last 3)  Recent Labs    11/07/21 1700 11/07/21 2059 11/08/21 0556  GLUCAP 94 212* 115*  Some lability will monitor. Continue Semglee to 20U, discussed plan to transition back to her novolog at home 11.  Hypertension. Continue Cozaar to 75mg  to allow more fluid to be removed with HD Vitals:   11/07/21 2032 11/08/21 0452  BP: (!) 161/76 (!) 160/70  Pulse: 95 88  Resp: 16 17  Temp: 98.8 F (37.1 C) 98.2 F (36.8 C)  SpO2: 100% 98%   Mildly elevated, avoid overtreatment due to dialysis 12.  Hyperparathyroidism of renal origin.  Plan outpatient parathyroidectomy per Dr.Lateef 13.  Overweight.  BMI 29.11-->26.35  Dietary follow-up. Provided list of foods that can help with weight loss. Discussed benefits of intermittent fasting.  14. Cushingoid?--outpt work up 32. Constipation: resolved. Decrease colace to daily prn. D/c senna. Discussed benefits of high fiber foods. Continue bowel regimen. D/c miralax.  16. Right quadriceps tear: surgery complete with good healing on XR, minimal postop pain, discussed importance of participating in 3 hours therapy daily as much as she can tolerate and she is very motivated and excited for rehab, remove dressing today  -ROM as above 17. Gas: continue simethicone prn 18. Ileus: resolved, regular diet resumed -tolerating.  19. Abd spasms: resolved 20. Black tarry stools: discussed heme occult negative last night. Hgb reviewed and stable.  21. Vitamin D deficiency: continue ergocalciferol 50,000U once per week for 7 weeks. Discussed that this dose is higher than a daily supplement.  22. Left heel pain and. Left foot pain over metatarsals-  3/5 MRI was reviewed with patient. There is associated atrophy of  the forefoot  muscles likely associated with her diabetes but no signs of fx, inflammation, etc.   -recommend appropriate shoewear to support arch and metatarsals  -added voltaren gel to help with pain control.  -stretching of arch/heel cord with therapies      LOS: 22 days A FACE TO FACE EVALUATION WAS PERFORMED  Ladarion Munyon P Dewey Viens 11/08/2021, 9:15 AM

## 2021-11-10 NOTE — Progress Notes (Signed)
Late Entry Note: ? ?D/C summary faxed to [facility] for continuation of care.  ? ?Melven Sartorius ?Renal Navigator ?[phone] ?

## 2021-11-14 ENCOUNTER — Telehealth: Payer: Self-pay | Admitting: *Deleted

## 2021-11-14 ENCOUNTER — Observation Stay
Admission: EM | Admit: 2021-11-14 | Discharge: 2021-11-15 | Disposition: A | Discharge status: Home / Self Care | Payer: Medicare Other | Attending: Internal Medicine | Admitting: Internal Medicine

## 2021-11-14 DIAGNOSIS — D631 Anemia in chronic kidney disease: Secondary | ICD-10-CM | POA: Diagnosis not present

## 2021-11-14 DIAGNOSIS — D638 Anemia in other chronic diseases classified elsewhere: Secondary | ICD-10-CM

## 2021-11-14 DIAGNOSIS — E1022 Type 1 diabetes mellitus with diabetic chronic kidney disease: Secondary | ICD-10-CM | POA: Diagnosis not present

## 2021-11-14 DIAGNOSIS — T8149XA Infection following a procedure, other surgical site, initial encounter: Secondary | ICD-10-CM

## 2021-11-14 DIAGNOSIS — Z79899 Other long term (current) drug therapy: Secondary | ICD-10-CM | POA: Insufficient documentation

## 2021-11-14 DIAGNOSIS — Z992 Dependence on renal dialysis: Secondary | ICD-10-CM | POA: Diagnosis not present

## 2021-11-14 DIAGNOSIS — Z96642 Presence of left artificial hip joint: Secondary | ICD-10-CM | POA: Insufficient documentation

## 2021-11-14 DIAGNOSIS — E109 Type 1 diabetes mellitus without complications: Secondary | ICD-10-CM

## 2021-11-14 DIAGNOSIS — N186 End stage renal disease: Secondary | ICD-10-CM

## 2021-11-14 DIAGNOSIS — Z8781 Personal history of (healed) traumatic fracture: Secondary | ICD-10-CM

## 2021-11-14 DIAGNOSIS — E1142 Type 2 diabetes mellitus with diabetic polyneuropathy: Secondary | ICD-10-CM | POA: Diagnosis present

## 2021-11-14 DIAGNOSIS — T8189XA Other complications of procedures, not elsewhere classified, initial encounter: Secondary | ICD-10-CM | POA: Diagnosis not present

## 2021-11-14 DIAGNOSIS — I120 Hypertensive chronic kidney disease with stage 5 chronic kidney disease or end stage renal disease: Secondary | ICD-10-CM | POA: Diagnosis not present

## 2021-11-14 DIAGNOSIS — Z794 Long term (current) use of insulin: Secondary | ICD-10-CM | POA: Insufficient documentation

## 2021-11-14 DIAGNOSIS — L089 Local infection of the skin and subcutaneous tissue, unspecified: Secondary | ICD-10-CM | POA: Diagnosis present

## 2021-11-14 DIAGNOSIS — L7682 Other postprocedural complications of skin and subcutaneous tissue: Secondary | ICD-10-CM | POA: Diagnosis present

## 2021-11-14 DIAGNOSIS — T8131XA Disruption of external operation (surgical) wound, not elsewhere classified, initial encounter: Secondary | ICD-10-CM | POA: Diagnosis not present

## 2021-11-14 LAB — CBC WITH DIFFERENTIAL/PLATELET
Abs Immature Granulocytes: 0.02 10*3/uL (ref 0.00–0.07)
Basophils Absolute: 0.1 10*3/uL (ref 0.0–0.1)
Basophils Relative: 1 %
Eosinophils Absolute: 0.3 10*3/uL (ref 0.0–0.5)
Eosinophils Relative: 5 %
HCT: 29.3 % — ABNORMAL LOW (ref 36.0–46.0)
Hemoglobin: 9.3 g/dL — ABNORMAL LOW (ref 12.0–15.0)
Immature Granulocytes: 0 %
Lymphocytes Relative: 8 %
Lymphs Abs: 0.5 10*3/uL — ABNORMAL LOW (ref 0.7–4.0)
MCH: 32.4 pg (ref 26.0–34.0)
MCHC: 31.7 g/dL (ref 30.0–36.0)
MCV: 102.1 fL — ABNORMAL HIGH (ref 80.0–100.0)
Monocytes Absolute: 0.5 10*3/uL (ref 0.1–1.0)
Monocytes Relative: 8 %
Neutro Abs: 4.3 10*3/uL (ref 1.7–7.7)
Neutrophils Relative %: 78 %
Platelets: 252 10*3/uL (ref 150–400)
RBC: 2.87 MIL/uL — ABNORMAL LOW (ref 3.87–5.11)
RDW: 15.6 % — ABNORMAL HIGH (ref 11.5–15.5)
WBC: 5.6 10*3/uL (ref 4.0–10.5)
nRBC: 0 % (ref 0.0–0.2)

## 2021-11-14 LAB — PROTIME-INR
INR: 1.2 (ref 0.8–1.2)
Prothrombin Time: 14.8 seconds (ref 11.4–15.2)

## 2021-11-14 LAB — COMPREHENSIVE METABOLIC PANEL
ALT: 9 U/L (ref 0–44)
AST: 21 U/L (ref 15–41)
Albumin: 3.8 g/dL (ref 3.5–5.0)
Alkaline Phosphatase: 708 U/L — ABNORMAL HIGH (ref 38–126)
Anion gap: 15 (ref 5–15)
BUN: 20 mg/dL (ref 6–20)
CO2: 29 mmol/L (ref 22–32)
Calcium: 9.2 mg/dL (ref 8.9–10.3)
Chloride: 94 mmol/L — ABNORMAL LOW (ref 98–111)
Creatinine, Ser: 3.14 mg/dL — ABNORMAL HIGH (ref 0.44–1.00)
GFR, Estimated: 17 mL/min — ABNORMAL LOW (ref >60)
Glucose, Bld: 167 mg/dL — ABNORMAL HIGH (ref 70–99)
Potassium: 4 mmol/L (ref 3.5–5.1)
Sodium: 138 mmol/L (ref 135–145)
Total Bilirubin: 0.8 mg/dL (ref 0.3–1.2)
Total Protein: 7.7 g/dL (ref 6.5–8.1)

## 2021-11-14 MED ORDER — PIPERACILLIN-TAZOBACTAM 3.375 G IVPB 30 MIN
3.3750 g | Freq: Once | INTRAVENOUS | Status: DC
Start: 2021-11-14 — End: 2021-11-15

## 2021-11-14 MED ORDER — CEFAZOLIN SODIUM-DEXTROSE 2-4 GM/100ML-% IV SOLN
2.0000 g | INTRAVENOUS | Status: DC
  Filled 2021-11-14: qty 100

## 2021-11-14 MED ORDER — VANCOMYCIN HCL IN DEXTROSE 1-5 GM/200ML-% IV SOLN
1000.0000 mg | Freq: Once | INTRAVENOUS | Status: AC
  Administered 2021-11-14: 1000 mg via INTRAVENOUS
  Filled 2021-11-14: qty 200

## 2021-11-14 NOTE — ED Triage Notes (Signed)
Patient to ER via POV with complaints of open wound present to left groin/ hip. Reports having orthopaedic surgery six weeks ago and the wound remain opened and did not heal. States that Dr Mack Guise advised her to come to the ER for iv abx so she could have surgery to close the wound tomorrow.  ? ?Has been applying abx ointment to area. No obvious infection or drainage.  ?

## 2021-11-14 NOTE — Progress Notes (Signed)
PHARMACY -  BRIEF ANTIBIOTIC NOTE  ? ?Pharmacy has received consult(s) for zosyn and vancomycin from an ED provider.  The patient's profile has been reviewed for ht/wt/allergies/indication/available labs.   ? ?One time order(s) placed by MD for Vancomycin and zosyn ? ?Further antibiotics/pharmacy consults should be ordered by admitting physician if indicated.       ?                ?Thank you, ?Rozann Holts A ?11/14/2021  7:22 PM  ?

## 2021-11-14 NOTE — Telephone Encounter (Signed)
[name] PT called for POC 1wk1, 2wk4 and SN eval for wound assessment. Approval given. ?

## 2021-11-14 NOTE — ED Notes (Signed)
Patient resting comfortably on stretcher in room. RR even and unlabored. Patient has no new complaints or needs at this time. Patient has been updated about room assignment. ? ?This nurse called the floor to inquire about nurse to be assigned since a room has been assigned. The unit clerk states that the charge nurse has stepped away but will assign one shortly. ? ?

## 2021-11-14 NOTE — ED Notes (Signed)
Patient's Vanc will be infused at a slower rate due to concerns about Red Man Syndrome associated with it. ?

## 2021-11-14 NOTE — ED Provider Notes (Addendum)
? ?[facility] ?Provider Note ? ? ? Event Date/Time  ? First MD Initiated Contact with Patient 11/14/21 2121   ?  (approximate) ? ? ?History  ? ?Wound Check ? ? ?HPI ? ?[name] is a 56 y.o. female who presents with a wound to her left hip after a left hip arthroplasty surgery 6 weeks ago.  She was evaluated today and instructed to come to the ED by Dr. Mack Guise from orthopedics for IV antibiotics and admission for likely washout surgery tomorrow.  The patient reports redness and some purulent discharge from the wound.  She denies fever or chills.  She has no other acute complaints. ?  ? ? ?Physical Exam  ? ?Triage Vital Signs: ?ED Triage Vitals  ?Enc Vitals Group  ?   BP 11/14/21 1855 (!) 183/82  ?   Pulse Rate 11/14/21 1855 99  ?   Resp 11/14/21 1855 18  ?   Temp 11/14/21 1852 98.9 ?F (37.2 ?C)  ?   Temp Source 11/14/21 1852 Oral  ?   SpO2 11/14/21 1855 97 %  ?   Weight 11/14/21 1855 170 lb 10.2 oz (77.4 kg)  ?   Height 11/14/21 1855 5\' 7"  (1.702 m)  ?   Head Circumference --   ?   Peak Flow --   ?   Pain Score 11/14/21 1855 0  ?   Pain Loc --   ?   Pain Edu? --   ?   Excl. in [place]? --   ? ? ?Most recent vital signs: ?Vitals:  ? 11/14/21 2300 11/15/21 0000  ?BP: (!) 191/103 (!) 164/80  ?Pulse: 94 94  ?Resp:    ?Temp:  98.4 ?F (36.9 ?C)  ?SpO2: 99% 98%  ? ? ?General: Awake, no distress.  ?CV:  Good peripheral perfusion.  ?Resp:  Normal effort.  ?Abd:  No distention.  ?Other:  Left hip with surgical incision.  Approximately 4 cm area of dehiscence with some purulent drainage.  No surrounding erythema or induration. ? ? ?ED Results / Procedures / Treatments  ? ?Labs ?(all labs ordered are listed, but only abnormal results are displayed) ?Labs Reviewed  ?CBC WITH DIFFERENTIAL/PLATELET - Abnormal; Notable for the following components:  ?    Result Value  ? RBC 2.87 (*)   ? Hemoglobin 9.3 (*)   ? HCT 29.3 (*)   ? MCV 102.1 (*)   ? RDW 15.6 (*)   ? Lymphs Abs 0.5 (*)   ? All other components  within normal limits  ?COMPREHENSIVE METABOLIC PANEL - Abnormal; Notable for the following components:  ? Chloride 94 (*)   ? Glucose, Bld 167 (*)   ? Creatinine, Ser 3.14 (*)   ? Alkaline Phosphatase 708 (*)   ? GFR, Estimated 17 (*)   ? All other components within normal limits  ?CULTURE, BLOOD (ROUTINE X 2)  ?CULTURE, BLOOD (ROUTINE X 2)  ?PROTIME-INR  ?TYPE AND SCREEN  ? ? ? ?EKG ? ? ? ? ?RADIOLOGY ? ? ?PROCEDURES: ? ?Critical Care performed: No ? ?Procedures ? ? ?MEDICATIONS ORDERED IN ED: ?Medications  ?piperacillin-tazobactam (ZOSYN) IVPB 3.375 g (has no administration in time range)  ?ceFAZolin (ANCEF) IVPB 2g/100 mL premix (has no administration in time range)  ?vancomycin (VANCOCIN) IVPB 1000 mg/200 mL premix (1,000 mg Intravenous New Bag/Given 11/14/21 2253)  ? ? ? ?IMPRESSION / MDM / ASSESSMENT AND PLAN / ED COURSE  ?I reviewed the triage vital signs and the nursing notes. ? ?  56 year old female status post left hip arthroplasty in late January presents with concern for wound dehiscence and infection.  She was instructed to come to the ED by Dr. Mack Guise from orthopedics. ? ?I reviewed the past medical records; the patient has had a somewhat complicated course, with the surgery initially at the end of January.  She was subsequently admitted for rehabilitation.  She was mostly recently discharged on 3/10. ? ?On exam, the patient does have an area of wound dehiscence and some purulent type drainage.  Her vital signs are normal except for hypertension. ? ?I discussed the case with Dr. Mack Guise, who confirms that the patient should be admitted and started on IV vancomycin and Zosyn with a plan for likely washout tomorrow morning. ? ?----------------------------------------- ?10:34 PM on 11/14/2021 ?----------------------------------------- ? ?I consulted Dr. Damita Dunnings from the hospitalist service for admission; based on our discussion she agrees to admit the patient. ? ?FINAL CLINICAL IMPRESSION(S) / ED  DIAGNOSES  ? ?Final diagnoses:  ?Postoperative complication of skin involving drainage from surgical wound  ? ? ? ?Rx / DC Orders  ? ?ED Discharge Orders   ? ? None  ? ?  ? ? ? ?Note:  This document was prepared using Dragon voice recognition software and may include unintentional dictation errors.  ?  Arta Silence, MD ?11/15/21 0029 ? ?  ?Arta Silence, MD ?11/15/21 6244 ? ?

## 2021-11-15 ENCOUNTER — Inpatient Hospital Stay: Payer: Medicare Other | Admitting: Anesthesiology

## 2021-11-15 ENCOUNTER — Encounter
Admission: EM | Disposition: A | Discharge status: Home / Self Care | Payer: Self-pay | Source: Home / Self Care | Attending: Emergency Medicine

## 2021-11-15 ENCOUNTER — Other Ambulatory Visit: Payer: Self-pay

## 2021-11-15 ENCOUNTER — Inpatient Hospital Stay: Admit: 2021-11-15 | Payer: BC Managed Care – PPO | Admitting: Orthopedic Surgery

## 2021-11-15 DIAGNOSIS — T8189XA Other complications of procedures, not elsewhere classified, initial encounter: Secondary | ICD-10-CM | POA: Diagnosis not present

## 2021-11-15 DIAGNOSIS — L7682 Other postprocedural complications of skin and subcutaneous tissue: Secondary | ICD-10-CM | POA: Diagnosis not present

## 2021-11-15 DIAGNOSIS — Z8781 Personal history of (healed) traumatic fracture: Secondary | ICD-10-CM

## 2021-11-15 DIAGNOSIS — Z01810 Encounter for preprocedural cardiovascular examination: Secondary | ICD-10-CM | POA: Diagnosis not present

## 2021-11-15 DIAGNOSIS — Z992 Dependence on renal dialysis: Secondary | ICD-10-CM

## 2021-11-15 DIAGNOSIS — N186 End stage renal disease: Secondary | ICD-10-CM

## 2021-11-15 DIAGNOSIS — L089 Local infection of the skin and subcutaneous tissue, unspecified: Secondary | ICD-10-CM | POA: Diagnosis present

## 2021-11-15 HISTORY — PX: APPLICATION OF WOUND VAC: SHX5189

## 2021-11-15 HISTORY — PX: INCISION AND DRAINAGE HIP: SHX1801

## 2021-11-15 LAB — TYPE AND SCREEN
ABO/RH(D): A POS
Antibody Screen: NEGATIVE

## 2021-11-15 LAB — GLUCOSE, CAPILLARY
Glucose-Capillary: 161 mg/dL — ABNORMAL HIGH (ref 70–99)
Glucose-Capillary: 116 mg/dL — ABNORMAL HIGH (ref 70–99)
Glucose-Capillary: 150 mg/dL — ABNORMAL HIGH (ref 70–99)
Glucose-Capillary: 143 mg/dL — ABNORMAL HIGH (ref 70–99)
Glucose-Capillary: 152 mg/dL — ABNORMAL HIGH (ref 70–99)

## 2021-11-15 SURGERY — IRRIGATION AND DEBRIDEMENT HIP
Anesthesia: General | Site: Hip | Laterality: Left

## 2021-11-15 MED ORDER — DOXYCYCLINE HYCLATE 100 MG PO TABS: 100.0000 mg | ORAL_TABLET | Freq: Two times a day (BID) | ORAL | 0 refills | Status: AC

## 2021-11-15 MED ORDER — DIPHENHYDRAMINE HCL 50 MG/ML IJ SOLN
INTRAMUSCULAR | Status: DC | PRN
  Administered 2021-11-15: 12.5 mg via INTRAVENOUS

## 2021-11-15 MED ORDER — LIDOCAINE HCL (PF) 2 % IJ SOLN
INTRAMUSCULAR | Status: AC
  Filled 2021-11-15: qty 5

## 2021-11-15 MED ORDER — FENTANYL CITRATE (PF) 100 MCG/2ML IJ SOLN
INTRAMUSCULAR | Status: DC | PRN
  Administered 2021-11-15 (×2): 25 ug via INTRAVENOUS
  Administered 2021-11-15: 50 ug via INTRAVENOUS
  Administered 2021-11-15 (×2): 25 ug via INTRAVENOUS

## 2021-11-15 MED ORDER — MORPHINE SULFATE (PF) 2 MG/ML IV SOLN: 2.0000 mg | INTRAVENOUS | Status: DC | PRN

## 2021-11-15 MED ORDER — CEPHALEXIN 500 MG PO CAPS
500.0000 mg | ORAL_CAPSULE | Freq: Two times a day (BID) | ORAL | Status: DC
  Administered 2021-11-15: 500 mg via ORAL
  Filled 2021-11-15: qty 1

## 2021-11-15 MED ORDER — ONDANSETRON HCL 4 MG/2ML IJ SOLN
INTRAMUSCULAR | Status: AC
  Filled 2021-11-15: qty 2

## 2021-11-15 MED ORDER — ONDANSETRON HCL 4 MG/2ML IJ SOLN
4.0000 mg | Freq: Once | INTRAMUSCULAR | Status: AC | PRN
  Administered 2021-11-15: 4 mg via INTRAVENOUS

## 2021-11-15 MED ORDER — OXYCODONE HCL 5 MG PO TABS: 5.0000 mg | ORAL_TABLET | ORAL | Status: DC | PRN

## 2021-11-15 MED ORDER — INSULIN ASPART 100 UNIT/ML IJ SOLN
0.0000 [IU] | INTRAMUSCULAR | Status: DC
  Administered 2021-11-15 (×2): 3 [IU] via SUBCUTANEOUS
  Filled 2021-11-15 (×3): qty 1

## 2021-11-15 MED ORDER — ACETAMINOPHEN 325 MG PO TABS
650.0000 mg | ORAL_TABLET | Freq: Four times a day (QID) | ORAL | Status: DC | PRN
Start: 2021-11-15 — End: 2021-11-15

## 2021-11-15 MED ORDER — CEFAZOLIN SODIUM-DEXTROSE 2-3 GM-%(50ML) IV SOLR
INTRAVENOUS | Status: DC | PRN
  Administered 2021-11-15: 2 g via INTRAVENOUS

## 2021-11-15 MED ORDER — SCOPOLAMINE 1 MG/3DAYS TD PT72
MEDICATED_PATCH | TRANSDERMAL | Status: AC
  Filled 2021-11-15: qty 1

## 2021-11-15 MED ORDER — 0.9 % SODIUM CHLORIDE (POUR BTL) OPTIME
TOPICAL | Status: DC | PRN
  Administered 2021-11-15 (×2): 1000 mL

## 2021-11-15 MED ORDER — DOXYCYCLINE HYCLATE 100 MG PO TABS
100.0000 mg | ORAL_TABLET | Freq: Two times a day (BID) | ORAL | Status: DC
  Administered 2021-11-15: 100 mg via ORAL
  Filled 2021-11-15: qty 1

## 2021-11-15 MED ORDER — FENTANYL CITRATE (PF) 100 MCG/2ML IJ SOLN
INTRAMUSCULAR | Status: AC
  Filled 2021-11-15: qty 2

## 2021-11-15 MED ORDER — ONDANSETRON HCL 4 MG PO TABS: 4.0000 mg | ORAL_TABLET | Freq: Four times a day (QID) | ORAL | Status: DC | PRN

## 2021-11-15 MED ORDER — SODIUM CHLORIDE 0.9 % IR SOLN
Status: DC | PRN
  Administered 2021-11-15: 500 mL

## 2021-11-15 MED ORDER — LIDOCAINE HCL (CARDIAC) PF 100 MG/5ML IV SOSY
PREFILLED_SYRINGE | INTRAVENOUS | Status: DC | PRN
  Administered 2021-11-15: 100 mg via INTRAVENOUS

## 2021-11-15 MED ORDER — OXYCODONE HCL 5 MG/5ML PO SOLN: 5.0000 mg | Freq: Once | ORAL | Status: DC | PRN

## 2021-11-15 MED ORDER — ONDANSETRON HCL 4 MG/2ML IJ SOLN: 4.0000 mg | Freq: Four times a day (QID) | INTRAMUSCULAR | Status: DC | PRN

## 2021-11-15 MED ORDER — ONDANSETRON HCL 4 MG/2ML IJ SOLN
INTRAMUSCULAR | Status: DC | PRN
  Administered 2021-11-15: 4 mg via INTRAVENOUS

## 2021-11-15 MED ORDER — FENTANYL CITRATE (PF) 100 MCG/2ML IJ SOLN: 25.0000 ug | INTRAMUSCULAR | Status: DC | PRN

## 2021-11-15 MED ORDER — OXYCODONE HCL 5 MG PO TABS: 5.0000 mg | ORAL_TABLET | Freq: Once | ORAL | Status: DC | PRN

## 2021-11-15 MED ORDER — CEFAZOLIN SODIUM-DEXTROSE 2-4 GM/100ML-% IV SOLN
INTRAVENOUS | Status: AC
  Administered 2021-11-15: 2 g via INTRAVENOUS
  Filled 2021-11-15: qty 100

## 2021-11-15 MED ORDER — LACTATED RINGERS IV SOLN
INTRAVENOUS | Status: DC | PRN
Start: 2021-11-15 — End: 2021-11-15

## 2021-11-15 MED ORDER — ACETAMINOPHEN 650 MG RE SUPP: 650.0000 mg | Freq: Four times a day (QID) | RECTAL | Status: DC | PRN

## 2021-11-15 MED ORDER — PHENYLEPHRINE 40 MCG/ML (10ML) SYRINGE FOR IV PUSH (FOR BLOOD PRESSURE SUPPORT)
PREFILLED_SYRINGE | INTRAVENOUS | Status: AC
  Filled 2021-11-15: qty 10

## 2021-11-15 MED ORDER — ACETAMINOPHEN 10 MG/ML IV SOLN
INTRAVENOUS | Status: AC
  Filled 2021-11-15: qty 100

## 2021-11-15 MED ORDER — PIPERACILLIN-TAZOBACTAM IN DEX 2-0.25 GM/50ML IV SOLN
2.2500 g | Freq: Three times a day (TID) | INTRAVENOUS | Status: DC
  Administered 2021-11-15: 2.25 g via INTRAVENOUS
  Filled 2021-11-15 (×4): qty 50

## 2021-11-15 MED ORDER — NEOMYCIN-POLYMYXIN B GU 40-200000 IR SOLN
Status: DC | PRN
  Administered 2021-11-15: 8 mL

## 2021-11-15 MED ORDER — CEPHALEXIN 500 MG PO CAPS: 500.0000 mg | ORAL_CAPSULE | Freq: Two times a day (BID) | ORAL | 0 refills | Status: AC

## 2021-11-15 MED ORDER — DIPHENHYDRAMINE HCL 50 MG/ML IJ SOLN
INTRAMUSCULAR | Status: AC
  Filled 2021-11-15: qty 2

## 2021-11-15 MED ORDER — PHENYLEPHRINE HCL (PRESSORS) 10 MG/ML IV SOLN
INTRAVENOUS | Status: DC | PRN
  Administered 2021-11-15 (×2): 80 ug via INTRAVENOUS

## 2021-11-15 MED ORDER — PROPOFOL 10 MG/ML IV BOLUS
INTRAVENOUS | Status: DC | PRN
  Administered 2021-11-15: 150 mg via INTRAVENOUS

## 2021-11-15 MED ORDER — ACETAMINOPHEN 10 MG/ML IV SOLN
INTRAVENOUS | Status: DC | PRN
  Administered 2021-11-15: 1000 mg via INTRAVENOUS

## 2021-11-15 MED ORDER — SCOPOLAMINE 1 MG/3DAYS TD PT72
1.0000 | MEDICATED_PATCH | Freq: Once | TRANSDERMAL | Status: DC
  Administered 2021-11-15: 1.5 mg via TRANSDERMAL

## 2021-11-15 SURGICAL SUPPLY — 44 items
BLADE SURG 15 STRL LF DISP TIS (BLADE) IMPLANT
BLADE SURG 15 STRL SS (BLADE) ×1
BLADE SURG SZ10 CARB STEEL (BLADE) ×2 IMPLANT
CNTNR SPEC 2.5X3XGRAD LEK (MISCELLANEOUS) ×1
CONT SPEC 4OZ STER OR WHT (MISCELLANEOUS) ×1
CONTAINER SPEC 2.5X3XGRAD LEK (MISCELLANEOUS) IMPLANT
COVER BACK TABLE REUSABLE LG (DRAPES) ×2 IMPLANT
DRAPE 3/4 80X56 (DRAPES) ×4 IMPLANT
DRAPE INCISE IOBAN 66X60 STRL (DRAPES) ×2 IMPLANT
DRAPE ORTHO SPLIT 77X108 STRL (DRAPES) ×2
DRAPE SURG 17X11 SM STRL (DRAPES) ×2 IMPLANT
DRAPE SURG ORHT 6 SPLT 77X108 (DRAPES) ×2 IMPLANT
DRSG OPSITE POSTOP 4X10 (GAUZE/BANDAGES/DRESSINGS) ×2 IMPLANT
ELECT CAUTERY BLADE 6.4 (BLADE) ×2 IMPLANT
ELECT REM PT RETURN 9FT ADLT (ELECTROSURGICAL) ×2
ELECTRODE REM PT RTRN 9FT ADLT (ELECTROSURGICAL) ×1 IMPLANT
GAUZE SPONGE 4X4 12PLY STRL (GAUZE/BANDAGES/DRESSINGS) ×2 IMPLANT
GAUZE XEROFORM 1X8 LF (GAUZE/BANDAGES/DRESSINGS) ×4 IMPLANT
GLOVE SURG ORTHO LTX SZ9 (GLOVE) ×4 IMPLANT
GLOVE SURG UNDER POLY LF SZ9 (GLOVE) ×2 IMPLANT
GOWN STRL REUS TWL 2XL XL LVL4 (GOWN DISPOSABLE) ×2 IMPLANT
GOWN STRL REUS W/ TWL LRG LVL3 (GOWN DISPOSABLE) ×1 IMPLANT
GOWN STRL REUS W/TWL LRG LVL3 (GOWN DISPOSABLE) ×1
HEMOVAC 400ML (MISCELLANEOUS) ×2
HOLSTER ELECTROSUGICAL PENCIL (MISCELLANEOUS) ×2 IMPLANT
KIT DRAIN HEMOVAC JP 7FR 400ML (MISCELLANEOUS) ×1 IMPLANT
KIT PREVENA INCISION MGT 13 (CANNISTER) ×1 IMPLANT
KIT TURNOVER KIT A (KITS) ×2 IMPLANT
MANIFOLD NEPTUNE II (INSTRUMENTS) ×2 IMPLANT
NDL FILTER BLUNT 18X1 1/2 (NEEDLE) ×1 IMPLANT
NEEDLE FILTER BLUNT 18X 1/2SAF (NEEDLE) ×1
NEEDLE FILTER BLUNT 18X1 1/2 (NEEDLE) ×1 IMPLANT
NS IRRIG 1000ML POUR BTL (IV SOLUTION) ×3 IMPLANT
NS IRRIG 500ML POUR BTL (IV SOLUTION) ×1 IMPLANT
PACK HIP PROSTHESIS (MISCELLANEOUS) ×2 IMPLANT
PILLOW ABDUCTION FOAM SM (MISCELLANEOUS) ×2 IMPLANT
SPONGE T-LAP 18X18 ~~LOC~~+RFID (SPONGE) ×8 IMPLANT
STRIP CLOSURE SKIN 1/2X4 (GAUZE/BANDAGES/DRESSINGS) ×1 IMPLANT
SUT ETHILON 3-0 FS-10 30 BLK (SUTURE) ×2
SUT PDS 2-0 27IN (SUTURE) ×1 IMPLANT
SUTURE EHLN 3-0 FS-10 30 BLK (SUTURE) IMPLANT
SWAB CULTURE AMIES ANAERIB BLU (MISCELLANEOUS) ×2 IMPLANT
SYR 10ML LL (SYRINGE) ×2 IMPLANT
WATER STERILE IRR 500ML POUR (IV SOLUTION) ×2 IMPLANT

## 2021-11-15 NOTE — Progress Notes (Signed)
Pharmacy Antibiotic Note ? ?[name] is a 56 y.o. female admitted on 11/14/2021 with  wound infection .  Pharmacy has been consulted for Zosyn dosing.  Pt in ESRD , on HD.  ? ?Plan: ?Zosyn 2.25 gm IV Q8H ordered to start on 3/18 @ 0030.  ? ?Height: 5\' 7"  (170.2 cm) ?Weight: 77.4 kg (170 lb 10.2 oz) ?IBW/kg (Calculated) : 61.6 ? ?Temp (24hrs), Avg:98.7 ?F (37.1 ?C), Min:98.4 ?F (36.9 ?C), Max:98.9 ?F (37.2 ?C) ? ?Recent Labs  ?Lab 11/14/21 ?1857  ?WBC 5.6  ?CREATININE 3.14*  ?  ?Estimated Creatinine Clearance: 21.2 mL/min (A) (by C-G formula based on SCr of 3.14 mg/dL (H)).   ? ?Allergies  ?Allergen Reactions  ? Enalapril Hives and Other (See Comments)  ?  Angioedema face.  ? Ivp Dye [Iodinated Contrast Media] Hives  ? Lisinopril Shortness Of Breath  ? Shellfish Allergy Swelling and Other (See Comments)  ?  patient tolerates shellfish by mouth without problem  ? ? ?Antimicrobials this admission: ?  >>  ?  >>  ? ?Dose adjustments this admission: ? ? ?Microbiology results: ? BCx:  ? UCx:   ? Sputum:   ? MRSA PCR:  ? ?Thank you for allowing pharmacy to be a part of this patient?s care. ? ?[name] ?11/15/2021 12:34 AM ? ?

## 2021-11-15 NOTE — Anesthesia Postprocedure Evaluation (Signed)
Anesthesia Post Note ? ?Patient: [name] ? ?Procedure(s) Performed: IRRIGATION AND DEBRIDEMENT LEFT HIP WOUND (Left: Hip) ?APPLICATION OF WOUND VAC (Left: Hip) ? ?Patient location during evaluation: PACU ?Anesthesia Type: General ?Level of consciousness: awake ?Pain management: pain level controlled ?Vital Signs Assessment: post-procedure vital signs reviewed and stable ?Respiratory status: spontaneous breathing, nonlabored ventilation and patient connected to nasal cannula oxygen ?Cardiovascular status: blood pressure returned to baseline ?Postop Assessment: no apparent nausea or vomiting ?Anesthetic complications: no ? ? ?No notable events documented. ? ? ?Last Vitals:  ?Vitals:  ? 11/15/21 1115 11/15/21 1145  ?BP: (!) 171/78 139/68  ?Pulse:  80  ?Resp:  17  ?Temp: 37.2 ?C 36.7 ?C  ?SpO2: 97% 96%  ?  ?Last Pain:  ?Vitals:  ? 11/15/21 1145  ?TempSrc: Oral  ?PainSc:   ? ? ?  ?  ?  ?  ?  ?  ? ?Deno Etienne ? ? ? ? ?

## 2021-11-15 NOTE — Consult Note (Signed)
?ORTHOPAEDIC CONSULTATION ? ?REQUESTING PHYSICIAN: Dwyane Dee, MD ? ?Chief Complaint: Left hip wound dehiscence ? ?HPI: ?[name] is a 56 y.o. female with type 1 diabetes and end-stage renal disease on hemodialysis who [facility] night with left wound dehiscence after an anterior hemiarthroplasty by Dr. Harlow Mares on 09/29/2022.  Patient was noted by her physical therapist yesterday to have a wound dehiscence in her left hip incision and referred her to the urgent care last night.  Patient was admitted to [facility] on the hospitalist service.  IV antibiotics were started including vancomycin and Zosyn.  Patient denies any fevers chills or other recent illnesses.  She does not have an elevated white count.  Patient's postoperative course has been complicated by a fall resulting in a right quadriceps tendon tear which has been surgically fixed by an outside Psychologist, sport and exercise.  Patient is in a knee immobilizer and unable to flex her right knee due to postop restrictions. ? ?Past Medical History:  ?Diagnosis Date  ? Anemia   ? vitamin d3 deficiency  ? Anxiety   ? Chronic kidney disease   ? End Stage Renal Disease  ? Diabetes mellitus without complication ([place])   ? GERD (gastroesophageal reflux disease)   ? nothing over last few years  ? High serum parathyroid hormone (PTH)   ? checked through Dialysis  ? History of kidney stones 2000  ? Hypertension   ? Neuromuscular disorder ([place])   ? neuropathy in feet  ? PONV (postoperative nausea and vomiting)   ? severe nausea requiring many doses of post op antiemetics  ? Stroke [facility]) 10/2017  ? thinks she had a series of mini strokes.right leg up to right side of face were numb. no loss of consciousness  ? ?Past Surgical History:  ?Procedure Laterality Date  ? ABDOMINAL HYSTERECTOMY  2007  ? AV FISTULA INSERTION W/ RF MAGNETIC GUIDANCE N/A 12/08/2017  ? Procedure: AV FISTULA INSERTION W/RF MAGNETIC GUIDANCE;  Surgeon: Katha Cabal, MD;  Location: [place] [facility];  Service: Cardiovascular;  Laterality: N/A;  ? DIALYSIS/PERMA CATHETER INSERTION Right 10/03/2021  ? Procedure: DIALYSIS/PERMA CATHETER INSERTION;  Surgeon: Katha Cabal, MD;  Location: [place] [facility];  Service: Cardiovascular;  Laterality: Right;  ? HIP ARTHROPLASTY Left 09/29/2021  ? Procedure: ARTHROPLASTY BIPOLAR HIP (HEMIARTHROPLASTY);  Surgeon: Lovell Sheehan, MD;  Location: [facility];  Service: Orthopedics;  Laterality: Left;  ? QUADRICEPS TENDON REPAIR Right 10/16/2021  ? Procedure: REPAIR QUADRICEP TENDON;  Surgeon: Georgeanna Harrison, MD;  Location: [place];  Service: Orthopedics;  Laterality: Right;  ? ROTATOR CUFF REPAIR Right 2004  ? SHOULDER CLOSED REDUCTION Right 2004  ? UPPER EXTREMITY VENOGRAPHY Left 02/15/2018  ? Procedure: UPPER EXTREMITY VENOGRAPHY;  Surgeon: Katha Cabal, MD;  Location: [place] [facility];  Service: Cardiovascular;  Laterality: Left;  ? ?Social History  ? ?Socioeconomic History  ? Marital status: Married  ?  Spouse name: Aaron Edelman  ? Number of children: 0  ? Years of education: Not on file  ? Highest education level: Some college, no degree  ?Occupational History  ? Occupation: Clinical cytogeneticist  ?Tobacco Use  ? Smoking status: Never  ? Smokeless tobacco: Never  ?Vaping Use  ? Vaping Use: Never used  ?Substance and Sexual Activity  ? Alcohol use: No  ? Drug use: Never  ? Sexual activity: Yes  ?  Partners: Male  ?Other Topics Concern  ? Not on file  ?  Social History Narrative  ? Not on file  ? ?Social Determinants of Health  ? ?Financial Resource Strain: Not on file  ?Food Insecurity: Not on file  ?Transportation Needs: Not on file  ?Physical Activity: Not on file  ?Stress: Not on file  ?Social Connections: Not on file  ? ?Family History  ?Problem Relation Age of Onset  ? Emphysema Mother   ? COPD Mother   ? Cancer Father   ? Cancer Paternal Aunt   ? Diabetes Sister   ? Hypertension Sister   ? Eczema Sister   ? Diabetes  Brother   ? Hypertension Brother   ? Cardiomyopathy Brother   ? Alcohol abuse Brother   ? ?Allergies  ?Allergen Reactions  ? Enalapril Hives and Other (See Comments)  ?  Angioedema face.  ? Ivp Dye [Iodinated Contrast Media] Hives  ? Lisinopril Shortness Of Breath  ? Shellfish Allergy Swelling and Other (See Comments)  ?  patient tolerates shellfish by mouth without problem  ? ?Prior to Admission medications   ?Medication Sig Start Date End Date Taking? Authorizing Provider  ?acetaminophen (TYLENOL) 325 MG tablet Take 2 tablets (650 mg total) by mouth 3 (three) times daily. 11/07/21   Angiulli, Lavon Paganini, PA-C  ?ascorbic acid (VITAMIN C) 500 MG tablet Take 1 tablet (500 mg total) by mouth daily. 10/13/21   Angiulli, Lavon Paganini, PA-C  ?cephALEXin (KEFLEX) 500 MG capsule Take 1 capsule (500 mg total) by mouth every 12 (twelve) hours. 11/07/21   Angiulli, Lavon Paganini, PA-C  ?Darbepoetin Alfa (ARANESP) 60 MCG/0.3ML SOSY injection Inject 0.3 mLs (60 mcg total) into the vein every Friday with hemodialysis. 11/07/21   Angiulli, Lavon Paganini, PA-C  ?diclofenac Sodium (VOLTAREN) 1 % GEL Apply 2 g topically 3 (three) times daily. 11/07/21   Angiulli, Lavon Paganini, PA-C  ?HYDROcodone-acetaminophen (NORCO/VICODIN) 5-325 MG tablet Take 1-2 tablets by mouth every 4 (four) hours as needed for moderate pain (pain score 4-6). 11/07/21   Angiulli, Lavon Paganini, PA-C  ?insulin glargine (LANTUS) 100 UNIT/ML Solostar Pen Inject 20 Units into the skin daily. 11/07/21   Angiulli, Lavon Paganini, PA-C  ?losartan (COZAAR) 25 MG tablet Take 3 tablets (75 mg total) by mouth daily. 11/07/21   Angiulli, Lavon Paganini, PA-C  ?methocarbamol (ROBAXIN) 750 MG tablet Take 1 tablet (750 mg total) by mouth every 8 (eight) hours as needed for muscle spasms. 11/07/21   Angiulli, Lavon Paganini, PA-C  ?multivitamin (RENA-VIT) TABS tablet Take 1 tablet by mouth at bedtime. 11/07/21   Angiulli, Lavon Paganini, PA-C  ?polymixin-bacitracin (POLYSPORIN) 500-10000 UNIT/GM OINT ointment Apply 1 application.  topically 2 (two) times daily. 11/07/21   Angiulli, Lavon Paganini, PA-C  ?Vitamin D, Ergocalciferol, (DRISDOL) 1.25 MG (50000 UNIT) CAPS capsule Take 1 capsule (50,000 Units total) by mouth every 7 (seven) days. 11/10/21   Angiulli, Lavon Paganini, PA-C  ? ?No results found. ? ?Positive ROS: All other systems have been reviewed and were otherwise negative with the exception of those mentioned in the HPI and as above. ? ?Physical Exam: ?General: Alert, no acute distress.  Patient is seen in the preoperative area with her husband at the bedside. ? ?MUSCULOSKELETAL: Left hip: Patient has approximately a 2 cm area of wound dehiscence of the left anterior hip incision at approximately the midpoint.  There is no active drainage or surrounding erythema.  There is no purulence seen.  Patient has no significant swelling.  Distally she is neurovascular intact. ? ?Assessment: ?Left anterior hip incision dehiscence ? ?Plan: ?  I have recommended to the patient that we proceed with surgical evaluation of her wound.  I intend to culture the wound and perform irrigation and debridement with primary closure if possible.  If the wound cannot be closed then a VAC dressing would be applied.  She understands this procedure is being performed to reduce her risk for infection particularly in the setting of a left hip prosthesis. ? ?I discussed the risks and benefits of surgery. The risks include but are not limited to infection, bleeding , nerve or blood vessel injury, joint stiffness or loss of motion, persistent pain, weakness or instability, the need for further surgery, including repeat I&D. Patient understood these risks and wished to proceed.  ? ? ? ? ?Thornton Park, MD ? ? ? ?11/15/2021 ?9:23 AM ? ?  ?

## 2021-11-15 NOTE — Assessment & Plan Note (Deleted)
Patient going to the OR in the a.m. for wound debridement by orthopedist Dr. Mack Guise ?N.p.o. after midnight ?Continue Ancef, already ordered by Ortho ?

## 2021-11-15 NOTE — Assessment & Plan Note (Signed)
Patient currently nonambulant but receiving therapy.  Able to transfer with sliding board ?

## 2021-11-15 NOTE — TOC Transition Note (Signed)
Transition of Care (TOC) - CM/SW Discharge Note ? ? ?Patient Details  ?Name: [name] ?MRN: [id number] ?Date of Birth: [birth year] ? ?Transition of Care (TOC) CM/SW Contact:  ?Izola Price, RN ?Phone Number: ?11/15/2021, 12:40 PM ? ? ?Clinical Narrative: 3/18: Dc today. Code-44 explained and form to patient. Continue with any pta services per provider--no-reorder needed. Simmie Davies RN CM    ? ? ? ?Final next level of care: Home/Self Care (With previous services per provider. No reorder needed) ?Barriers to Discharge: Barriers Resolved ? ? ?Patient Goals and CMS Choice ?  ?  ?  ? ?Discharge Placement ?  ?           ?  ?  ?  ?  ? ?Discharge Plan and Services ?  ?  ?           ?  ?  ?  ?  ?  ?  ?  ?  ?  ?  ? ?Social Determinants of Health (SDOH) Interventions ?  ? ? ?Readmission Risk Interventions ?No flowsheet data found. ? ? ? ? ?

## 2021-11-15 NOTE — Care Management CC44 (Signed)
Condition Code 44 Documentation Completed ? ?Patient Details  ?Name: [name] ?MRN: [id number] ?Date of Birth: 1966/08/28 ? ? ?Condition Code 44 given:  Yes ?Patient signature on Condition Code 44 notice:  Yes ?Documentation of 2 MD's agreement:  Yes ?Code 44 added to claim:  Yes ? ? ? ?Izola Price, RN ?11/15/2021, 12:37 PM ? ?

## 2021-11-15 NOTE — Op Note (Signed)
11/15/2021 ? ?11:35 AM ? ?PATIENT:  [name]   ? ?PRE-OPERATIVE DIAGNOSIS: Dehiscence of left anterior hip incision ? ?POST-OPERATIVE DIAGNOSIS:  Same ? ?PROCEDURE: Left hip wound irrigation, debridement and culture with application of Prevena wound VAC. ? ?SURGEON:  Thornton Park, MD ? ?ANESTHESIA:   General ? ?PREOPERATIVE INDICATIONS:  [name] is a  56 y.o. female with a diagnosis of dehiscence of left anterior hip incision following a left hip hemiarthroplasty by Dr. Harlow Mares on 09/29/2021.  Patient states that she believes her incision began to show signs of dehiscence a few weeks ago at [facility].  [facility] after her physical therapist recommended she be evaluated for wound dehiscence after her PT session yesterday.  Patient was admitted to [facility] for IV antibiotics overnight.  I have recommended irrigation debridement and closure of her wound dehiscence. ? ?I discussed the risks and benefits of surgery. The risks include but are not limited to infection, bleeding, nerve or blood vessel injury, joint stiffness or loss of motion, persistent pain, weakness or instability, further wound dehiscence and the need for further surgery. Patient understood these risks and wished to proceed.  ? ?OPERATIVE FINDINGS: Wound dehiscence of anterior left hip incision without purulence.   ? ?OPERATIVE PROCEDURE: Patient was met in the preoperative area.  I marked the left hip according the hospital's correct site of surgery protocol.  Patient was brought to the operating room where she was placed supine on the operative table.  A timeout was performed to verify the patient's name, date of birth, medical record number, correct site of surgery and correct procedure to be performed.  Once all in attendance were in agreement the case began.  Patient was prepped and draped in a sterile fashion. ? ?A culture swab was taken of the open wound.   Approximately 1 to 2 mm of skin was resected circumferentially around the wound to freshen the skin edges.   Punctate bleeding was identified.  The bed of the wound appeared to have healthy granulation tissue.  There was no purulent fluid encountered.  There were two regions of the openwound bed which extended below the initial subcutaneous suture line.  The proximal of these 2 regions contained a small collection of serous fluid which was cultured.  A small approximately 2 to 3 mm satellite opening in the incision was noted proximally and communicated with the main wound.  Both the main wound and the small sinus tract were debrided with a curette.  Soft tissue collected from the curette was also sent to microbiology for culture.  The wound was then copiously irrigated with 2 L of GU infused saline.  The hip wound was then closed with 2-0 and 3-0 PDS in the subcutaneous tissue.  The skin of the main wound and satellite lesion were closed with 3-0 nylon.  Steri-Strips were placed over the suture line.  A Prevena wound VAC was then applied over the incision.   ? ?Patient was then awoken and brought to the PACU in stable condition.  I scrubbed and present for the entire case.  All sharp, sponge and instrument counts were correct at the conclusion the case.  I spoke with the patient's husband in the surgical waiting room to let him know that the patient's incision was successfully closed and a wound VAC applied.  Patient was comfortable in the recovery room. ? ?Patient will continue her Prevena VAC dressing for 7 days.  I recommend she follow-up in our office next Friday for wound check.  I have discussed this case with the hospitalist, Dr. Orlene Och and told him the patient may be discharged home today on oral antibiotics.  I have recommended Bactrim and Keflex.  Patient will remain on antibiotics until her follow-up in our clinic next Friday. ?

## 2021-11-15 NOTE — Assessment & Plan Note (Signed)
Stable

## 2021-11-15 NOTE — H&P (Addendum)
?History and Physical  ? ? ?Patient: [name] UTM:[id number] DOB: 1965-12-21 ?DOA: 11/14/2021 ?DOS:  11/14/2021 ?PCP: Pcp, No  ?Patient coming from: Home ? ?Chief Complaint:  ?Chief Complaint  ?Patient presents with  ? Wound Check  ? ? ?HPI: [name] is a 56 y.o. female with medical history significant for Type 1 diabetes, ESRD on HD, who is s/p left hip fracture in January 2023 status post repair with prolonged postoperative care, discharged from inpatient rehab on 3/11 who was sent in by orthopedics due to a nonhealing surgical wound.  The plan is for debridement in the OR on 3/18.  Patient is currently nonambulant due to a pubic ramus fracture on the right which happened at the same time of her left hip fracture as well as a quadricep tendon rupture and she is currently in right knee immobilizer and receiving therapy at home.  She otherwise denies complaints ?ED course: BP elevated at 183/82 with otherwise normal vitals.  Blood work mostly at baseline.  Hemoglobin 9.3 which is her baseline.  Hospitalist consulted for admission.  ? ?Review of Systems: As mentioned in the history of present illness. All other systems reviewed and are negative. ?Past Medical History:  ?Diagnosis Date  ? Anemia   ? vitamin d3 deficiency  ? Anxiety   ? Chronic kidney disease   ? End Stage Renal Disease  ? Diabetes mellitus without complication ([place])   ? GERD (gastroesophageal reflux disease)   ? nothing over last few years  ? High serum parathyroid hormone (PTH)   ? checked through Dialysis  ? History of kidney stones 2000  ? Hypertension   ? Neuromuscular disorder ([place])   ? neuropathy in feet  ? PONV (postoperative nausea and vomiting)   ? severe nausea requiring many doses of post op antiemetics  ? Stroke [facility]) 10/2017  ? thinks she had a series of mini strokes.right leg up to right side of face were numb. no loss of consciousness  ? ?Past Surgical History:  ?Procedure Laterality Date  ? ABDOMINAL HYSTERECTOMY  2007  ? AV  FISTULA INSERTION W/ RF MAGNETIC GUIDANCE N/A 12/08/2017  ? Procedure: AV FISTULA INSERTION W/RF MAGNETIC GUIDANCE;  Surgeon: Katha Cabal, MD;  Location: [place] [facility];  Service: Cardiovascular;  Laterality: N/A;  ? DIALYSIS/PERMA CATHETER INSERTION Right 10/03/2021  ? Procedure: DIALYSIS/PERMA CATHETER INSERTION;  Surgeon: Katha Cabal, MD;  Location: [place] [facility];  Service: Cardiovascular;  Laterality: Right;  ? HIP ARTHROPLASTY Left 09/29/2021  ? Procedure: ARTHROPLASTY BIPOLAR HIP (HEMIARTHROPLASTY);  Surgeon: Lovell Sheehan, MD;  Location: [facility];  Service: Orthopedics;  Laterality: Left;  ? QUADRICEPS TENDON REPAIR Right 10/16/2021  ? Procedure: REPAIR QUADRICEP TENDON;  Surgeon: Georgeanna Harrison, MD;  Location: [place];  Service: Orthopedics;  Laterality: Right;  ? ROTATOR CUFF REPAIR Right 2004  ? SHOULDER CLOSED REDUCTION Right 2004  ? UPPER EXTREMITY VENOGRAPHY Left 02/15/2018  ? Procedure: UPPER EXTREMITY VENOGRAPHY;  Surgeon: Katha Cabal, MD;  Location: [place] [facility];  Service: Cardiovascular;  Laterality: Left;  ? ?Social History:  reports that she has never smoked. She has never used smokeless tobacco. She reports that she does not drink alcohol and does not use drugs. ? ?Allergies  ?Allergen Reactions  ? Enalapril Hives and Other (See Comments)  ?  Angioedema face.  ? Ivp Dye [Iodinated Contrast Media] Hives  ? Lisinopril Shortness Of Breath  ? Shellfish Allergy Swelling and Other (See Comments)  ?  patient tolerates shellfish by mouth without problem  ? ? ?Family History  ?Problem Relation Age of Onset  ? Emphysema Mother   ? COPD Mother   ? Cancer Father   ? Cancer Paternal Aunt   ? Diabetes Sister   ? Hypertension Sister   ? Eczema Sister   ? Diabetes Brother   ? Hypertension Brother   ? Cardiomyopathy Brother   ? Alcohol abuse Brother   ? ? ?Prior to Admission medications   ?Medication Sig Start Date End Date Taking? Authorizing Provider  ?acetaminophen (TYLENOL)  325 MG tablet Take 2 tablets (650 mg total) by mouth 3 (three) times daily. 11/07/21   Angiulli, Lavon Paganini, PA-C  ?ascorbic acid (VITAMIN C) 500 MG tablet Take 1 tablet (500 mg total) by mouth daily. 10/13/21   Angiulli, Lavon Paganini, PA-C  ?cephALEXin (KEFLEX) 500 MG capsule Take 1 capsule (500 mg total) by mouth every 12 (twelve) hours. 11/07/21   Angiulli, Lavon Paganini, PA-C  ?Darbepoetin Alfa (ARANESP) 60 MCG/0.3ML SOSY injection Inject 0.3 mLs (60 mcg total) into the vein every Friday with hemodialysis. 11/07/21   Angiulli, Lavon Paganini, PA-C  ?diclofenac Sodium (VOLTAREN) 1 % GEL Apply 2 g topically 3 (three) times daily. 11/07/21   Angiulli, Lavon Paganini, PA-C  ?HYDROcodone-acetaminophen (NORCO/VICODIN) 5-325 MG tablet Take 1-2 tablets by mouth every 4 (four) hours as needed for moderate pain (pain score 4-6). 11/07/21   Angiulli, Lavon Paganini, PA-C  ?insulin glargine (LANTUS) 100 UNIT/ML Solostar Pen Inject 20 Units into the skin daily. 11/07/21   Angiulli, Lavon Paganini, PA-C  ?losartan (COZAAR) 25 MG tablet Take 3 tablets (75 mg total) by mouth daily. 11/07/21   Angiulli, Lavon Paganini, PA-C  ?methocarbamol (ROBAXIN) 750 MG tablet Take 1 tablet (750 mg total) by mouth every 8 (eight) hours as needed for muscle spasms. 11/07/21   Angiulli, Lavon Paganini, PA-C  ?multivitamin (RENA-VIT) TABS tablet Take 1 tablet by mouth at bedtime. 11/07/21   Angiulli, Lavon Paganini, PA-C  ?polymixin-bacitracin (POLYSPORIN) 500-10000 UNIT/GM OINT ointment Apply 1 application. topically 2 (two) times daily. 11/07/21   Angiulli, Lavon Paganini, PA-C  ?Vitamin D, Ergocalciferol, (DRISDOL) 1.25 MG (50000 UNIT) CAPS capsule Take 1 capsule (50,000 Units total) by mouth every 7 (seven) days. 11/10/21   Angiulli, Lavon Paganini, PA-C  ? ? ?Physical Exam: ?Vitals:  ? 11/14/21 1855 11/14/21 2200 11/14/21 2300 11/15/21 0000  ?BP: (!) 183/82 (!) 163/79 (!) 191/103 (!) 164/80  ?Pulse: 99 96 94 94  ?Resp: 18     ?Temp:    98.4 ?F (36.9 ?C)  ?TempSrc:    Oral  ?SpO2: 97% 97% 99% 98%  ?Weight: 77.4 kg      ?Height: 5\' 7"  (1.702 m)     ? ?Physical Exam ?Vitals and nursing note reviewed.  ? ? ? ?Data Reviewed: ?Relevant notes from primary care and specialist visits, past discharge summaries as available in EHR, including Care Everywhere. ?Prior diagnostic testing as pertinent to current admission diagnoses ?Updated medications and problem lists for reconciliation ?ED course, including vitals, labs, imaging, treatment and response to treatment ?Triage notes, nursing and pharmacy notes and ED provider's notes ?Notable results as noted in HPI ? ? ?Assessment and Plan: ?* Non-healing surgical wound of left groin ?Patient going to the OR in the a.m. for wound debridement by orthopedist Dr. Mack Guise ?N.p.o. after midnight ?Continue Ancef, already ordered by Ortho ? ? ?History of fracture of left hip ?Patient currently nonambulant but receiving therapy.  Able to transfer with  sliding board ? ?Anemia of chronic kidney failure, stage 5 (HCC) ?Stable ? ?Type 1 diabetes mellitus ([place]) ?Low-dose Lantus with sliding scale coverage ? ?End stage renal disease on dialysis [facility]) ?Nephrology consult for continuation of dialysis ? ? ? ? ? ? ?Advance Care Planning:   Code Status: Prior full ? ?Consults: renal, Dr. Everardo Beals, Dr. Mack Guise ? ?Family Communication: Husband at bedside  ?severity of Illness: ?The appropriate patient status for this patient is INPATIENT. Inpatient status is judged to be reasonable and necessary in order to provide the required intensity of service to ensure the patient's safety. The patient's presenting symptoms, physical exam findings, and initial radiographic and laboratory data in the context of their chronic comorbidities is felt to place them at high risk for further clinical deterioration. Furthermore, it is not anticipated that the patient will be medically stable for discharge from the hospital within 2 midnights of admission.  ? ?* I certify that at the point of admission it is my clinical  judgment that the patient will require inpatient hospital care spanning beyond 2 midnights from the point of admission due to high intensity of service, high risk for further deterioration and high freque

## 2021-11-15 NOTE — Assessment & Plan Note (Addendum)
-   Patient underwent left hip wound I&D with wound VAC placement in the OR with orthopedic surgery on 11/15/2021 ?- Patient understands to continue wound VAC in place until outpatient follow-up with orthopedic surgery ?- Prescribed 10-day course of doxycycline and Keflex at discharge ? ?

## 2021-11-15 NOTE — Assessment & Plan Note (Addendum)
-   Received dialysis on Friday.  Currently on Monday, Wednesday, Friday schedule ?- Stable for discharge home and continuing on outpatient regular regimen ?

## 2021-11-15 NOTE — Progress Notes (Signed)
Pt is discharging home. DC education completed. No c/o post surgery pain. Husband at bedside.  ?

## 2021-11-15 NOTE — Discharge Summary (Signed)
Physician Discharge Summary   Patient: [name] MRN: [id number] DOB: [birth year]  Admit date:     11/14/2021  Discharge date: 11/15/21  Discharge Physician: Lewie Chamber   PCP: Pcp, No   Recommendations at discharge:   Follow-up with orthopedic surgery  Discharge Diagnoses: Principal Problem:   Non-healing surgical wound of left groin Active Problems:   History of fracture of left hip   End stage renal disease on dialysis (HCC)   Type 1 diabetes mellitus (HCC)   Anemia of chronic kidney failure, stage 5 (HCC)   Wound infection  Resolved Problems:   * No resolved hospital problems. *   Assessment and Plan: * Non-healing surgical wound of left groin - Patient underwent left hip wound I&D with wound VAC placement in the OR with orthopedic surgery on 11/15/2021 - Patient understands to continue wound VAC in place until outpatient follow-up with orthopedic surgery - Prescribed 10-day course of doxycycline and Keflex at discharge   History of fracture of left hip Patient currently nonambulant but receiving therapy.  Able to transfer with sliding board  Anemia of chronic kidney failure, stage 5 (HCC) Stable  Type 1 diabetes mellitus (HCC) - Continue home regimen  End stage renal disease on dialysis [facility]) - Received dialysis on Friday.  Currently on Monday, Wednesday, Friday schedule - Stable for discharge home and continuing on outpatient regular regimen        Consultants: Orthopedic surgery Procedures performed: Left hip wound I&D with WV placement, 11/15/21  Disposition: Home Diet recommendation:  Discharge Diet Orders (From admission, onward)     Start     Ordered   11/15/21 0000  Diet Carb Modified        11/15/21 1239   11/15/21 0000  Diet Carb Modified        11/15/21 1239           Renal diet DISCHARGE MEDICATION: Allergies as of 11/15/2021       Reactions   Enalapril Hives, Other (See Comments)   Angioedema face.   Ivp Dye [iodinated  Contrast Media] Hives   Lisinopril Shortness Of Breath   Shellfish Allergy Swelling, Other (See Comments)   patient tolerates shellfish by mouth without problem        Medication List     TAKE these medications    acetaminophen 325 MG tablet Commonly known as: TYLENOL Take 2 tablets (650 mg total) by mouth 3 (three) times daily.   ascorbic acid 500 MG tablet Commonly known as: VITAMIN C Take 1 tablet (500 mg total) by mouth daily.   cephALEXin 500 MG capsule Commonly known as: KEFLEX Take 1 capsule (500 mg total) by mouth 2 (two) times daily for 10 days. Take after dialysis on dialysis days What changed:  when to take this additional instructions   Darbepoetin Alfa 60 MCG/0.3ML Sosy injection Commonly known as: ARANESP Inject 0.3 mLs (60 mcg total) into the vein every Friday with hemodialysis.   diclofenac Sodium 1 % Gel Commonly known as: VOLTAREN Apply 2 g topically 3 (three) times daily.   doxycycline 100 MG tablet Commonly known as: VIBRA-TABS Take 1 tablet (100 mg total) by mouth every 12 (twelve) hours for 10 days. No adjustment needed during dialysis days. Take as prescribed   HYDROcodone-acetaminophen 5-325 MG tablet Commonly known as: NORCO/VICODIN Take 1-2 tablets by mouth every 4 (four) hours as needed for moderate pain (pain score 4-6).   insulin glargine 100 UNIT/ML Solostar Pen Commonly known as: LANTUS Inject 20  Units into the skin daily.   losartan 25 MG tablet Commonly known as: COZAAR Take 3 tablets (75 mg total) by mouth daily.   methocarbamol 750 MG tablet Commonly known as: ROBAXIN Take 1 tablet (750 mg total) by mouth every 8 (eight) hours as needed for muscle spasms.   multivitamin Tabs tablet Take 1 tablet by mouth at bedtime.   polymixin-bacitracin 500-10000 UNIT/GM Oint ointment Apply 1 application. topically 2 (two) times daily.   Vitamin D (Ergocalciferol) 1.25 MG (50000 UNIT) Caps capsule Commonly known as: DRISDOL Take 1  capsule (50,000 Units total) by mouth every 7 (seven) days.               Discharge Care Instructions  (From admission, onward)           Start     Ordered   11/15/21 0000  Discharge wound care:       Comments: Continue wound vac in place until outpatient follow up   11/15/21 1239   11/15/21 0000  Discharge wound care:       Comments: Continue wound VAC in place until outpatient follow-up with orthopedic surgery   11/15/21 1239            Discharge Exam: Filed Weights   11/14/21 1855  Weight: 77.4 kg   Physical Exam Constitutional:      Appearance: Normal appearance.  HENT:     Head: Normocephalic and atraumatic.     Mouth/Throat:     Mouth: Mucous membranes are moist.  Eyes:     Extraocular Movements: Extraocular movements intact.  Cardiovascular:     Rate and Rhythm: Normal rate and regular rhythm.  Pulmonary:     Effort: Pulmonary effort is normal.     Breath sounds: Normal breath sounds.  Abdominal:     General: Bowel sounds are normal. There is no distension.     Palpations: Abdomen is soft.     Tenderness: There is no abdominal tenderness.  Musculoskeletal:     Cervical back: Normal range of motion and neck supple.     Comments: Left hip wound VAC in place with good seal.  Right lower extremity immobilizer in place  Neurological:     Mental Status: She is alert and oriented to person, place, and time.  Psychiatric:        Mood and Affect: Mood normal.        Behavior: Behavior normal.     Condition at discharge: stable  The results of significant diagnostics from this hospitalization (including imaging, microbiology, ancillary and laboratory) are listed below for reference.   Imaging Studies: MR FOOT LEFT WO CONTRAST  Result Date: 10/31/2021 CLINICAL DATA:  Left foot pain. EXAM: MRI OF THE LEFT FOOT WITHOUT CONTRAST TECHNIQUE: Multiplanar, multisequence MR imaging of the left forefoot was performed. No intravenous contrast was administered.  COMPARISON:  Left foot x-rays from same day. FINDINGS: Bones/Joint/Cartilage No suspicious marrow signal abnormality. No fracture or dislocation. Mild second TMT joint osteoarthritis. No joint effusion. Ligaments Collateral ligaments are intact. Muscles and Tendons Flexor and extensor tendons are intact. Increased T2 signal within and atrophy of the intrinsic muscles of the forefoot, nonspecific, but likely related to diabetic muscle changes. Soft tissue Dorsal forefoot soft tissue swelling. No fluid collection or hematoma. No soft tissue mass. IMPRESSION: 1. No acute abnormality. Specifically, no fracture of the fourth proximal phalanx. Electronically Signed   By: Obie Dredge M.D.   On: 10/31/2021 14:38   DG Foot Complete Left  Result Date: 10/31/2021 CLINICAL DATA:  Beginning to walk after knee surgery and felt pain and swelling in left foot. EXAM: LEFT FOOT - COMPLETE 3+ VIEW COMPARISON:  None. FINDINGS: There is diffuse decreased bone mineralization. Mild joint space narrowing of the interphalangeal joints diffusely. Mild dorsal tarsometatarsal degenerative osteophytes seen on lateral view. Small plantar calcaneal heel spur. Mild chronic spurring at the Achilles insertion on the calcaneus. Mild posterior distal tibial plafond degenerative osteophytosis. Vascular calcifications are noted. Mild dorsal forefoot soft tissue swelling. Within the limitations of decreased bone mineralization, no definite acute fracture is visualized, however it is difficult to exclude a possible nondisplaced fracture seen on oblique view only within the proximal metaphysis of the proximal phalanx of fourth toe given overlapping bones versus minimal cortical step-off medially. No dislocation. IMPRESSION: Diffuse decreased bone mineralization limits evaluation for an acute fracture. There is soft tissue swelling of the dorsal aspect of the forefoot. No definitive fracture line is seen, however on oblique view there is question of  minimal lucency and cortical step-off that may represent normal overlap of bones versus a tiny nondisplaced fracture. Recommend clinical correlation for point tenderness. Electronically Signed   By: Neita Garnet M.D.   On: 10/31/2021 10:35   DG C-Arm 1-60 Min-No Report  Result Date: 10/16/2021 Fluoroscopy was utilized by the requesting physician.  No radiographic interpretation.   DG HIP UNILAT WITH PELVIS 2-3 VIEWS LEFT  Result Date: 10/16/2021 CLINICAL DATA:  Left hip  REPAIR QUADRICEP TENDON EXAM: DG HIP (WITH OR WITHOUT PELVIS) 2-3V LEFT COMPARISON:  X-ray left hip 10/02/2021 FINDINGS: Intraoperative repair of the quadriceps tendon per history. One low resolution intraoperative spot views of the left hip were obtained in a patient status post total left hip arthroplasty. No fracture visible on the limited views. Total fluoroscopy time: 10.3 seconds Total radiation dose: 1.8 mGy IMPRESSION: Intraoperative repair of the quadriceps tendon in a patient status post total left hip arthroplasty. Electronically Signed   By: Tish Frederickson M.D.   On: 10/16/2021 17:12   VAS [place] LOWER EXTREMITY VENOUS (DVT)  Result Date: 10/19/2021  Lower Venous DVT Study Patient Name:  [name]  Date of Exam:   10/19/2021 Medical Rec #: [id number]     Accession #:    [id number] Date of Birth: Jan 24, 1966     Patient Gender: F Patient Age:   5 years Exam Location:  [facility] Procedure:      VAS [place] LOWER EXTREMITY VENOUS (DVT) Referring Phys: Mariam Dollar --------------------------------------------------------------------------------  Indications: Edema.  Limitations: Extensive right lower extremity bandaging and immobilizer. Comparison Study: No prior study Performing Technologist: Gertie Fey MHA, RDMS, RVT, RDCS  Examination Guidelines: A complete evaluation includes B-mode imaging, spectral Doppler, color Doppler, and power Doppler as needed of all accessible portions of each vessel. Bilateral testing  is considered an integral part of a complete examination. Limited examinations for reoccurring indications may be performed as noted. The reflux portion of the exam is performed with the patient in reverse Trendelenburg.  Right Technical Findings: Not visualized segments include CFV.  +---------+---------------+---------+-----------+----------+--------------+ LEFT     CompressibilityPhasicitySpontaneityPropertiesThrombus Aging +---------+---------------+---------+-----------+----------+--------------+ CFV      Full           Yes      Yes                                 +---------+---------------+---------+-----------+----------+--------------+ SFJ      Full                                                        +---------+---------------+---------+-----------+----------+--------------+  FV Prox  Full                                                        +---------+---------------+---------+-----------+----------+--------------+ FV Mid   Full                                                        +---------+---------------+---------+-----------+----------+--------------+ FV DistalFull                                                        +---------+---------------+---------+-----------+----------+--------------+ PFV      Full                                                        +---------+---------------+---------+-----------+----------+--------------+ POP      Full           Yes      Yes                                 +---------+---------------+---------+-----------+----------+--------------+ PTV      Full                                                        +---------+---------------+---------+-----------+----------+--------------+ PERO     Full                                                        +---------+---------------+---------+-----------+----------+--------------+     Summary: LEFT: - There is no evidence of deep vein thrombosis  in the lower extremity.  - No cystic structure found in the popliteal fossa.  *See table(s) above for measurements and observations. Electronically signed by Gerarda Fraction on 10/19/2021 at 11:35:36 AM.    Final     Microbiology: Results for orders placed or performed during the hospital encounter of 11/14/21  Culture, blood (routine x 2)     Status: None (Preliminary result)   Collection Time: 11/14/21 10:41 PM   Specimen: BLOOD  Result Value Ref Range Status   Specimen Description BLOOD BLOOD RIGHT FOREARM  Final   Special Requests   Final    BOTTLES DRAWN AEROBIC AND ANAEROBIC Blood Culture results may not be optimal due to an excessive volume of blood received in culture bottles   Culture   Final    NO GROWTH < 12 HOURS Performed at [facility], [address] [place], [place] [address]    Report Status PENDING  Incomplete  Culture, blood (routine x 2)     Status: None (Preliminary result)   Collection Time: 11/14/21 10:47 PM   Specimen: BLOOD  Result Value Ref Range Status   Specimen Description BLOOD BLOOD RIGHT FOREARM  Final   Special Requests   Final    BOTTLES DRAWN AEROBIC AND ANAEROBIC Blood Culture results may not be optimal due to an excessive volume of blood received in culture bottles   Culture   Final    NO GROWTH < 12 HOURS Performed at [facility], [address], [place], [place] [address]    Report Status PENDING  Incomplete    Labs: CBC: Recent Labs  Lab 11/14/21 1857  WBC 5.6  NEUTROABS 4.3  HGB 9.3*  HCT 29.3*  MCV 102.1*  PLT 252   Basic Metabolic Panel: Recent Labs  Lab 11/14/21 1857  NA 138  K 4.0  CL 94*  CO2 29  GLUCOSE 167*  BUN 20  CREATININE 3.14*  CALCIUM 9.2   Liver Function Tests: Recent Labs  Lab 11/14/21 1857  AST 21  ALT 9  ALKPHOS 708*  BILITOT 0.8  PROT 7.7  ALBUMIN 3.8   CBG: Recent Labs  Lab 11/15/21 0103 11/15/21 0502 11/15/21 0807 11/15/21 1044 11/15/21 1142  GLUCAP 161* 116*  150* 143* 152*    Discharge time spent: greater than 30 minutes.  Signed: Lewie Chamber, MD [facility] 11/15/2021

## 2021-11-15 NOTE — Transfer of Care (Signed)
Immediate Anesthesia Transfer of Care Note ? ?Patient: [name] ? ?Procedure(s) Performed: IRRIGATION AND DEBRIDEMENT LEFT HIP WOUND (Left: Hip) ?APPLICATION OF WOUND VAC (Left: Hip) ? ?Patient Location: PACU ? ?Anesthesia Type:General ? ?Level of Consciousness: drowsy, patient cooperative and responds to stimulation ? ?Airway & Oxygen Therapy: Patient Spontanous Breathing and Patient connected to nasal cannula oxygen ? ?Post-op Assessment: Report given to RN and Post -op Vital signs reviewed and stable ? ?Post vital signs: Reviewed and stable ? ?Last Vitals:  ?Vitals Value Taken Time  ?BP 152/76 1041  ?Temp 98.39f 1041  ?Pulse 84 1041  ?Resp 14 1041  ?SpO2 95 1041  ? ? ?Last Pain:  ?Vitals:  ? 11/15/21 0015  ?TempSrc:   ?PainSc: 0-No pain  ?   ? ?  ? ?Complications: No notable events documented. ?

## 2021-11-15 NOTE — Assessment & Plan Note (Addendum)
-   Continue home regimen 

## 2021-11-15 NOTE — Plan of Care (Signed)

## 2021-11-15 NOTE — Anesthesia Preprocedure Evaluation (Addendum)
Anesthesia Evaluation  ?Patient identified by MRN, date of birth, ID band ?Patient awake ? ? ? ?History of Anesthesia Complications ?(+) PONV and history of anesthetic complications ? ?Airway ?Mallampati: II ? ?TM Distance: >3 FB ?Neck ROM: Full ? ? ? Dental ? ?(+) Teeth Intact ?  ?Pulmonary ?asthma ,  ?  ? ? ? ? ? ? ? Cardiovascular ?hypertension,  ? ? ?  ?Neuro/Psych ?Anxiety  Neuromuscular disease CVA   ? GI/Hepatic ?GERD  ,  ?Endo/Other  ?diabetes, Type 1 ? Renal/GU ?Dialysis and ESRFRenal diseaseLast dialysis yesterday  ? ?  ?Musculoskeletal ?s/p left hip fracture in January 2023 status post repair with prolonged postoperative care with a nonhealing surgical wound  ? Abdominal ?  ?Peds ? Hematology ? ?(+) Blood dyscrasia, anemia ,   ?Anesthesia Other Findings ? ? Reproductive/Obstetrics ? ?  ? ? ? ? ? ? ? ? ? ? ? ? ? ?  ?  ? ? ? ? ? ? ? ?Anesthesia Physical ?Anesthesia Plan ? ?ASA: 4 ? ?Anesthesia Plan: General  ? ?Post-op Pain Management:   ? ?Induction: Intravenous ? ?PONV Risk Score and Plan:  ? ?Airway Management Planned: Oral ETT and LMA ? ?Additional Equipment:  ? ?Intra-op Plan:  ? ?Post-operative Plan: Extubation in OR ? ?Informed Consent: I have reviewed the patients History and Physical, chart, labs and discussed the procedure including the risks, benefits and alternatives for the proposed anesthesia with the patient or authorized representative who has indicated his/her understanding and acceptance.  ? ? ? ?Dental advisory given ? ?Plan Discussed with:  ? ?Anesthesia Plan Comments: (Scop patch prescribed ?OSA score 2)  ? ? ? ? ?Anesthesia Quick Evaluation ? ?

## 2021-11-16 ENCOUNTER — Other Ambulatory Visit: Payer: Self-pay

## 2021-11-16 ENCOUNTER — Encounter: Payer: Self-pay | Admitting: Orthopedic Surgery

## 2021-11-18 LAB — AEROBIC CULTURE W GRAM STAIN (SUPERFICIAL SPECIMEN)
Culture: NO GROWTH
Culture: NO GROWTH
Culture: NO GROWTH
Gram Stain: NONE SEEN
Gram Stain: NONE SEEN

## 2021-11-19 DIAGNOSIS — Z96642 Presence of left artificial hip joint: Secondary | ICD-10-CM | POA: Insufficient documentation

## 2021-11-19 LAB — CULTURE, BLOOD (ROUTINE X 2)
Culture: NO GROWTH
Culture: NO GROWTH

## 2021-12-14 ENCOUNTER — Emergency Department: Payer: Medicare Other

## 2021-12-14 ENCOUNTER — Inpatient Hospital Stay: Payer: Medicare Other

## 2021-12-14 ENCOUNTER — Other Ambulatory Visit: Payer: Self-pay

## 2021-12-14 ENCOUNTER — Inpatient Hospital Stay: Payer: Medicare Other | Admitting: Certified Registered"

## 2021-12-14 ENCOUNTER — Encounter
Admission: EM | Disposition: A | Discharge status: Skilled Nursing Facility | Payer: Self-pay | Source: Home / Self Care | Attending: Internal Medicine

## 2021-12-14 ENCOUNTER — Inpatient Hospital Stay
Admission: EM | Admit: 2021-12-14 | Discharge: 2021-12-18 | DRG: 521 | Disposition: A | Discharge status: Skilled Nursing Facility | Payer: Medicare Other | Attending: Internal Medicine | Admitting: Internal Medicine

## 2021-12-14 DIAGNOSIS — Z91041 Radiographic dye allergy status: Secondary | ICD-10-CM | POA: Diagnosis not present

## 2021-12-14 DIAGNOSIS — Z833 Family history of diabetes mellitus: Secondary | ICD-10-CM

## 2021-12-14 DIAGNOSIS — Z992 Dependence on renal dialysis: Secondary | ICD-10-CM

## 2021-12-14 DIAGNOSIS — Z87442 Personal history of urinary calculi: Secondary | ICD-10-CM

## 2021-12-14 DIAGNOSIS — Z90710 Acquired absence of both cervix and uterus: Secondary | ICD-10-CM | POA: Diagnosis not present

## 2021-12-14 DIAGNOSIS — Z888 Allergy status to other drugs, medicaments and biological substances status: Secondary | ICD-10-CM | POA: Diagnosis not present

## 2021-12-14 DIAGNOSIS — D62 Acute posthemorrhagic anemia: Secondary | ICD-10-CM | POA: Diagnosis not present

## 2021-12-14 DIAGNOSIS — N2581 Secondary hyperparathyroidism of renal origin: Secondary | ICD-10-CM | POA: Diagnosis present

## 2021-12-14 DIAGNOSIS — E109 Type 1 diabetes mellitus without complications: Secondary | ICD-10-CM | POA: Diagnosis present

## 2021-12-14 DIAGNOSIS — N186 End stage renal disease: Secondary | ICD-10-CM | POA: Diagnosis present

## 2021-12-14 DIAGNOSIS — M80051A Age-related osteoporosis with current pathological fracture, right femur, initial encounter for fracture: Principal | ICD-10-CM | POA: Diagnosis present

## 2021-12-14 DIAGNOSIS — Z79899 Other long term (current) drug therapy: Secondary | ICD-10-CM | POA: Diagnosis not present

## 2021-12-14 DIAGNOSIS — K5900 Constipation, unspecified: Secondary | ICD-10-CM

## 2021-12-14 DIAGNOSIS — M8000XA Age-related osteoporosis with current pathological fracture, unspecified site, initial encounter for fracture: Secondary | ICD-10-CM

## 2021-12-14 DIAGNOSIS — G629 Polyneuropathy, unspecified: Secondary | ICD-10-CM | POA: Diagnosis present

## 2021-12-14 DIAGNOSIS — E1122 Type 2 diabetes mellitus with diabetic chronic kidney disease: Secondary | ICD-10-CM | POA: Diagnosis present

## 2021-12-14 DIAGNOSIS — Z8781 Personal history of (healed) traumatic fracture: Secondary | ICD-10-CM

## 2021-12-14 DIAGNOSIS — Z8673 Personal history of transient ischemic attack (TIA), and cerebral infarction without residual deficits: Secondary | ICD-10-CM | POA: Diagnosis not present

## 2021-12-14 DIAGNOSIS — Z794 Long term (current) use of insulin: Secondary | ICD-10-CM | POA: Diagnosis not present

## 2021-12-14 DIAGNOSIS — I120 Hypertensive chronic kidney disease with stage 5 chronic kidney disease or end stage renal disease: Secondary | ICD-10-CM | POA: Diagnosis present

## 2021-12-14 DIAGNOSIS — Z96642 Presence of left artificial hip joint: Secondary | ICD-10-CM | POA: Diagnosis present

## 2021-12-14 DIAGNOSIS — D631 Anemia in chronic kidney disease: Secondary | ICD-10-CM | POA: Diagnosis present

## 2021-12-14 DIAGNOSIS — S72001A Fracture of unspecified part of neck of right femur, initial encounter for closed fracture: Principal | ICD-10-CM | POA: Diagnosis present

## 2021-12-14 DIAGNOSIS — Z8249 Family history of ischemic heart disease and other diseases of the circulatory system: Secondary | ICD-10-CM | POA: Diagnosis not present

## 2021-12-14 DIAGNOSIS — D638 Anemia in other chronic diseases classified elsewhere: Secondary | ICD-10-CM | POA: Diagnosis not present

## 2021-12-14 DIAGNOSIS — I10 Essential (primary) hypertension: Secondary | ICD-10-CM | POA: Diagnosis present

## 2021-12-14 HISTORY — PX: HIP ARTHROPLASTY: SHX981

## 2021-12-14 HISTORY — PX: APPLICATION OF WOUND VAC: SHX5189

## 2021-12-14 LAB — CBC WITH DIFFERENTIAL/PLATELET
Abs Immature Granulocytes: 0.06 10*3/uL (ref 0.00–0.07)
Basophils Absolute: 0.1 10*3/uL (ref 0.0–0.1)
Basophils Relative: 1 %
Eosinophils Absolute: 0.3 10*3/uL (ref 0.0–0.5)
Eosinophils Relative: 4 %
HCT: 28.1 % — ABNORMAL LOW (ref 36.0–46.0)
Hemoglobin: 8.8 g/dL — ABNORMAL LOW (ref 12.0–15.0)
Immature Granulocytes: 1 %
Lymphocytes Relative: 12 %
Lymphs Abs: 0.8 10*3/uL (ref 0.7–4.0)
MCH: 32 pg (ref 26.0–34.0)
MCHC: 31.3 g/dL (ref 30.0–36.0)
MCV: 102.2 fL — ABNORMAL HIGH (ref 80.0–100.0)
Monocytes Absolute: 0.6 10*3/uL (ref 0.1–1.0)
Monocytes Relative: 8 %
Neutro Abs: 5.1 10*3/uL (ref 1.7–7.7)
Neutrophils Relative %: 74 %
Platelets: 223 10*3/uL (ref 150–400)
RBC: 2.75 MIL/uL — ABNORMAL LOW (ref 3.87–5.11)
RDW: 14.4 % (ref 11.5–15.5)
WBC: 6.9 10*3/uL (ref 4.0–10.5)
nRBC: 0 % (ref 0.0–0.2)

## 2021-12-14 LAB — PROTIME-INR
INR: 1.1 (ref 0.8–1.2)
Prothrombin Time: 13.6 seconds (ref 11.4–15.2)

## 2021-12-14 LAB — SURGICAL PCR SCREEN
MRSA, PCR: NEGATIVE
Staphylococcus aureus: NEGATIVE

## 2021-12-14 LAB — BASIC METABOLIC PANEL
Anion gap: 18 — ABNORMAL HIGH (ref 5–15)
BUN: 47 mg/dL — ABNORMAL HIGH (ref 6–20)
CO2: 26 mmol/L (ref 22–32)
Calcium: 9.5 mg/dL (ref 8.9–10.3)
Chloride: 95 mmol/L — ABNORMAL LOW (ref 98–111)
Creatinine, Ser: 5.4 mg/dL — ABNORMAL HIGH (ref 0.44–1.00)
GFR, Estimated: 9 mL/min — ABNORMAL LOW (ref >60)
Glucose, Bld: 206 mg/dL — ABNORMAL HIGH (ref 70–99)
Potassium: 5.1 mmol/L (ref 3.5–5.1)
Sodium: 139 mmol/L (ref 135–145)

## 2021-12-14 LAB — GLUCOSE, CAPILLARY
Glucose-Capillary: 200 mg/dL — ABNORMAL HIGH (ref 70–99)
Glucose-Capillary: 199 mg/dL — ABNORMAL HIGH (ref 70–99)
Glucose-Capillary: 205 mg/dL — ABNORMAL HIGH (ref 70–99)
Glucose-Capillary: 299 mg/dL — ABNORMAL HIGH (ref 70–99)
Glucose-Capillary: 235 mg/dL — ABNORMAL HIGH (ref 70–99)

## 2021-12-14 IMAGING — DX DG HIP (WITH OR WITHOUT PELVIS) 1V PORT*L*
1 series · 2 of 2 positions shown · non-contrast
Comparison: Right hip radiographs at [DATE] a.m.

CLINICAL DATA: Right hip replacement.  Intraoperative films.

EXAM:
DG HIP (WITH OR WITHOUT PELVIS) 1V PORT LEFT

[Series 3: hip lat · 0.14mm/px · 2 of 2 slices shown]
[im 1/2]
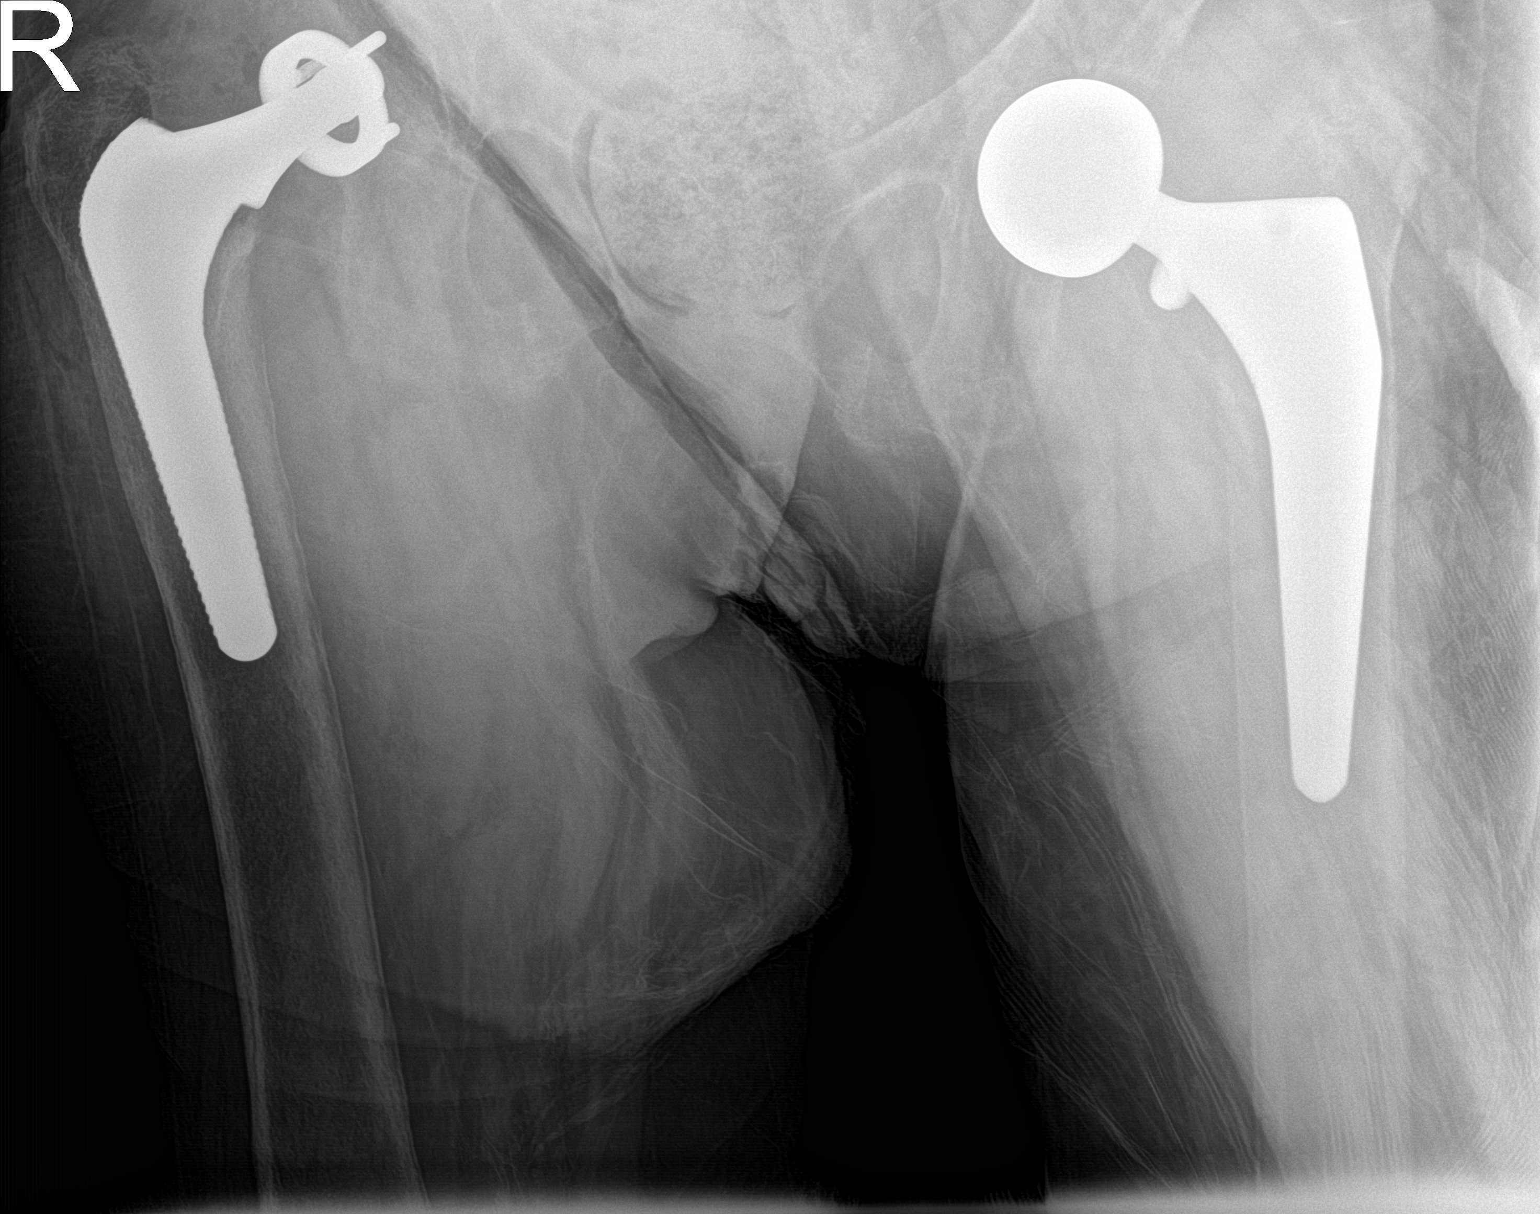
[im 2/2]
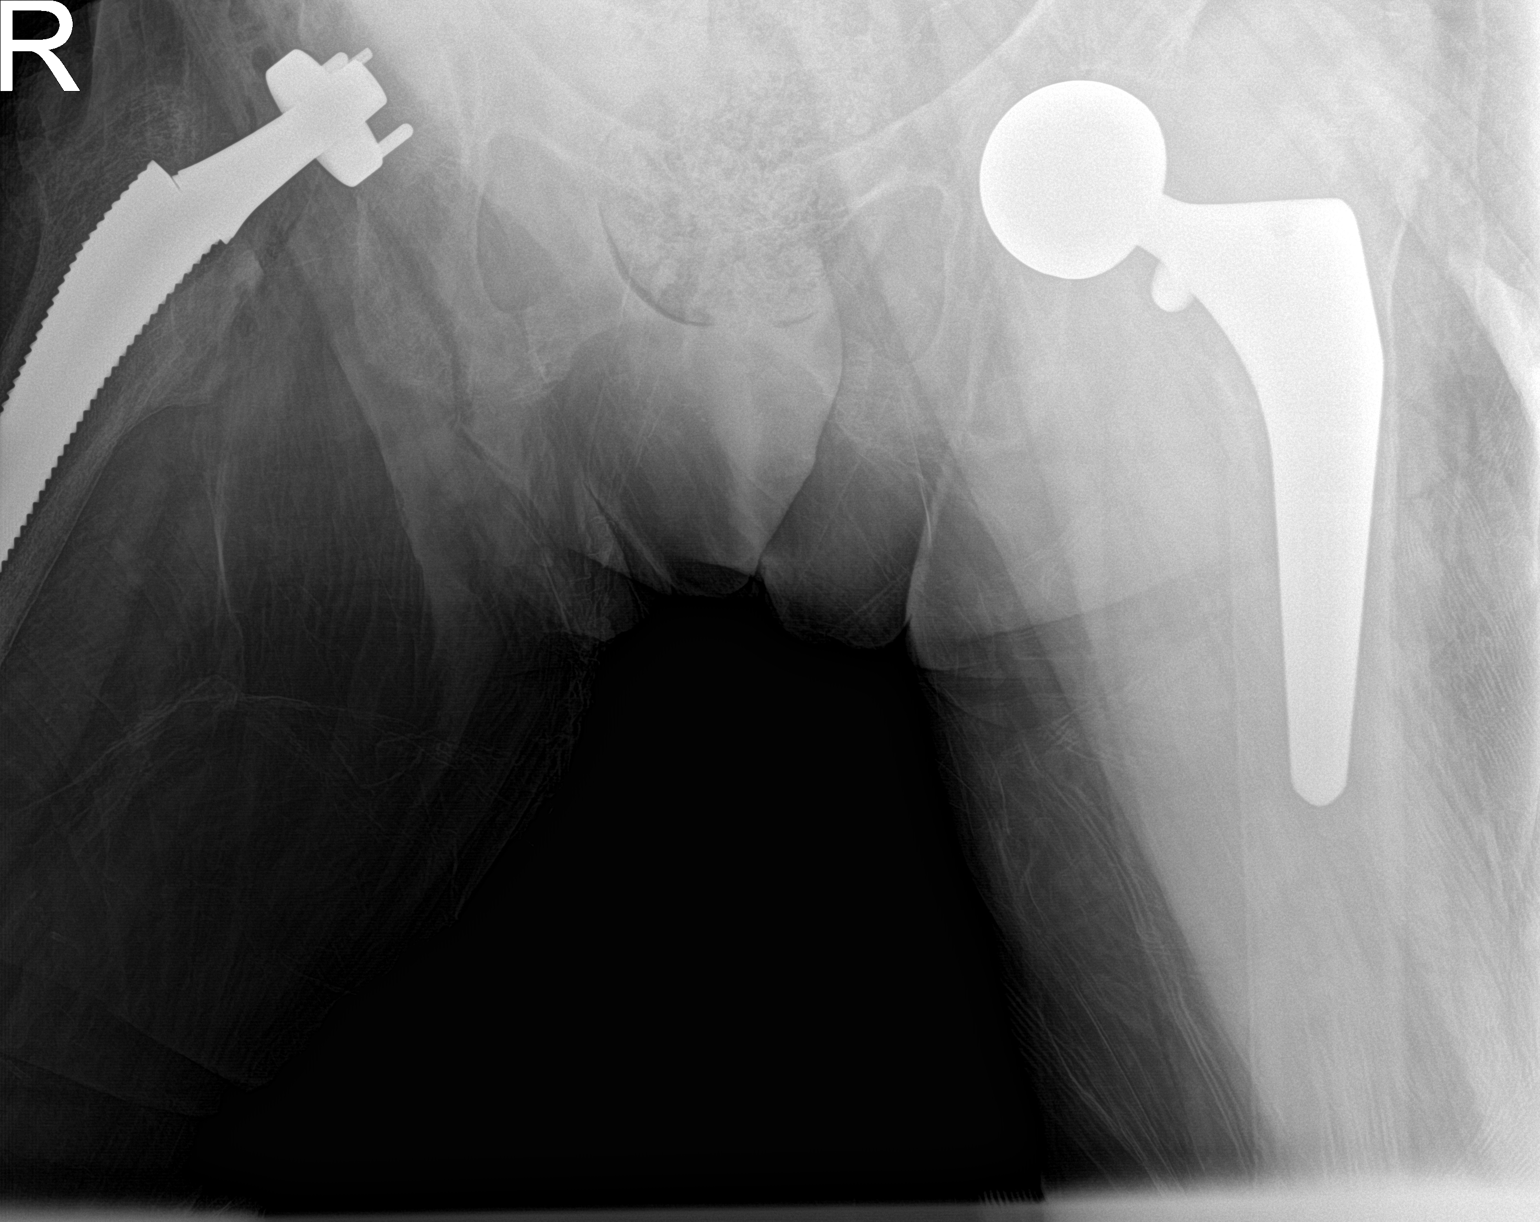

[2 of 2 positions shown; findings below may reference images not displayed]

FINDINGS: Two images are submitted. Two images are submitted. Femoral
component is in place. Acetabular component night yet finalized.
IMPRESSION: Intraoperative images of right hip arthroplasty without radiographic
evidence for complication.

## 2021-12-14 IMAGING — CR DG CHEST 1V
1 series · 1 of 1 positions shown · non-contrast
Comparison: [DATE]

CLINICAL DATA: Right hip pain.

EXAM:
CHEST  1 VIEW

[dg chest 1 view]
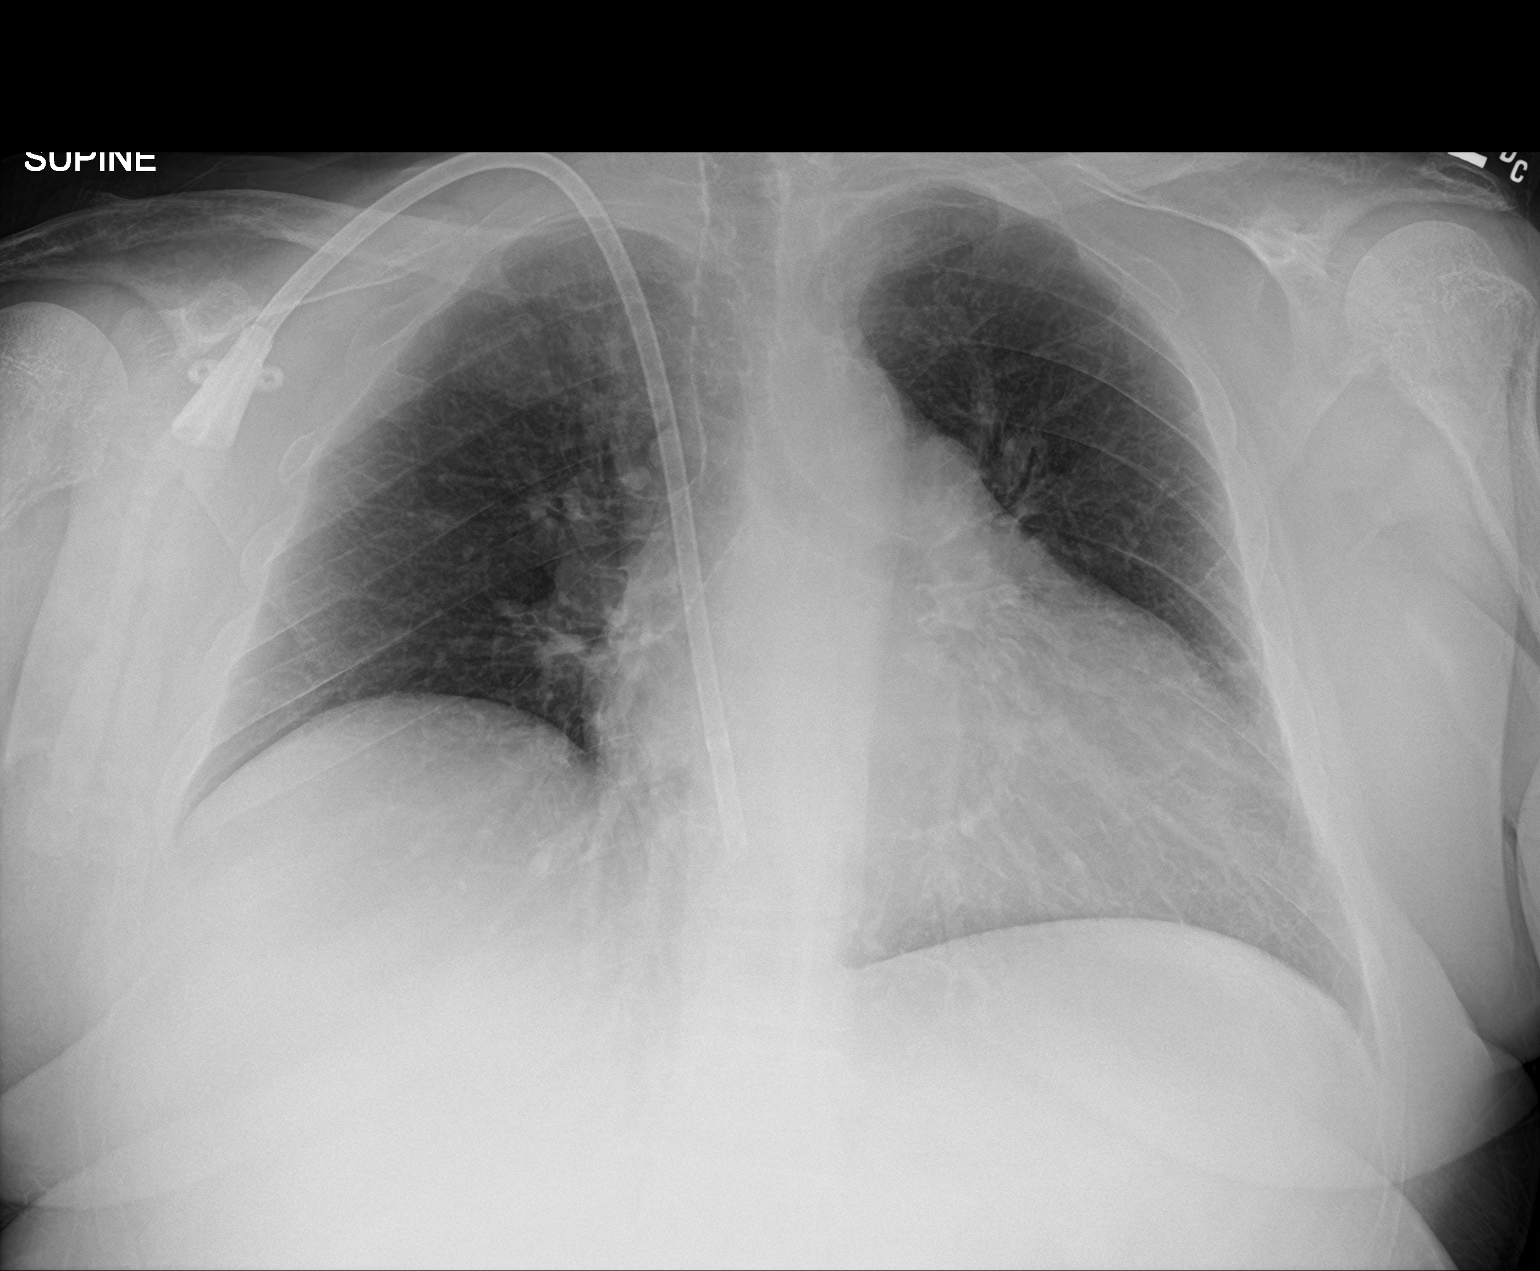

[1 of 1 positions shown; findings below may reference images not displayed]

FINDINGS: There is stable right-sided venous catheter positioning. The heart
size and mediastinal contours are within normal limits. Both lungs
are clear. The visualized skeletal structures are unremarkable.
IMPRESSION: No active disease.

## 2021-12-14 IMAGING — DX DG PORTABLE PELVIS
1 series · 1 of 1 positions shown · non-contrast
Comparison: Abdomen radiographs done on [DATE]

CLINICAL DATA: Status post right hip arthroplasty

EXAM:
PORTABLE PELVIS 1-2 VIEWS

[pelvis ap]
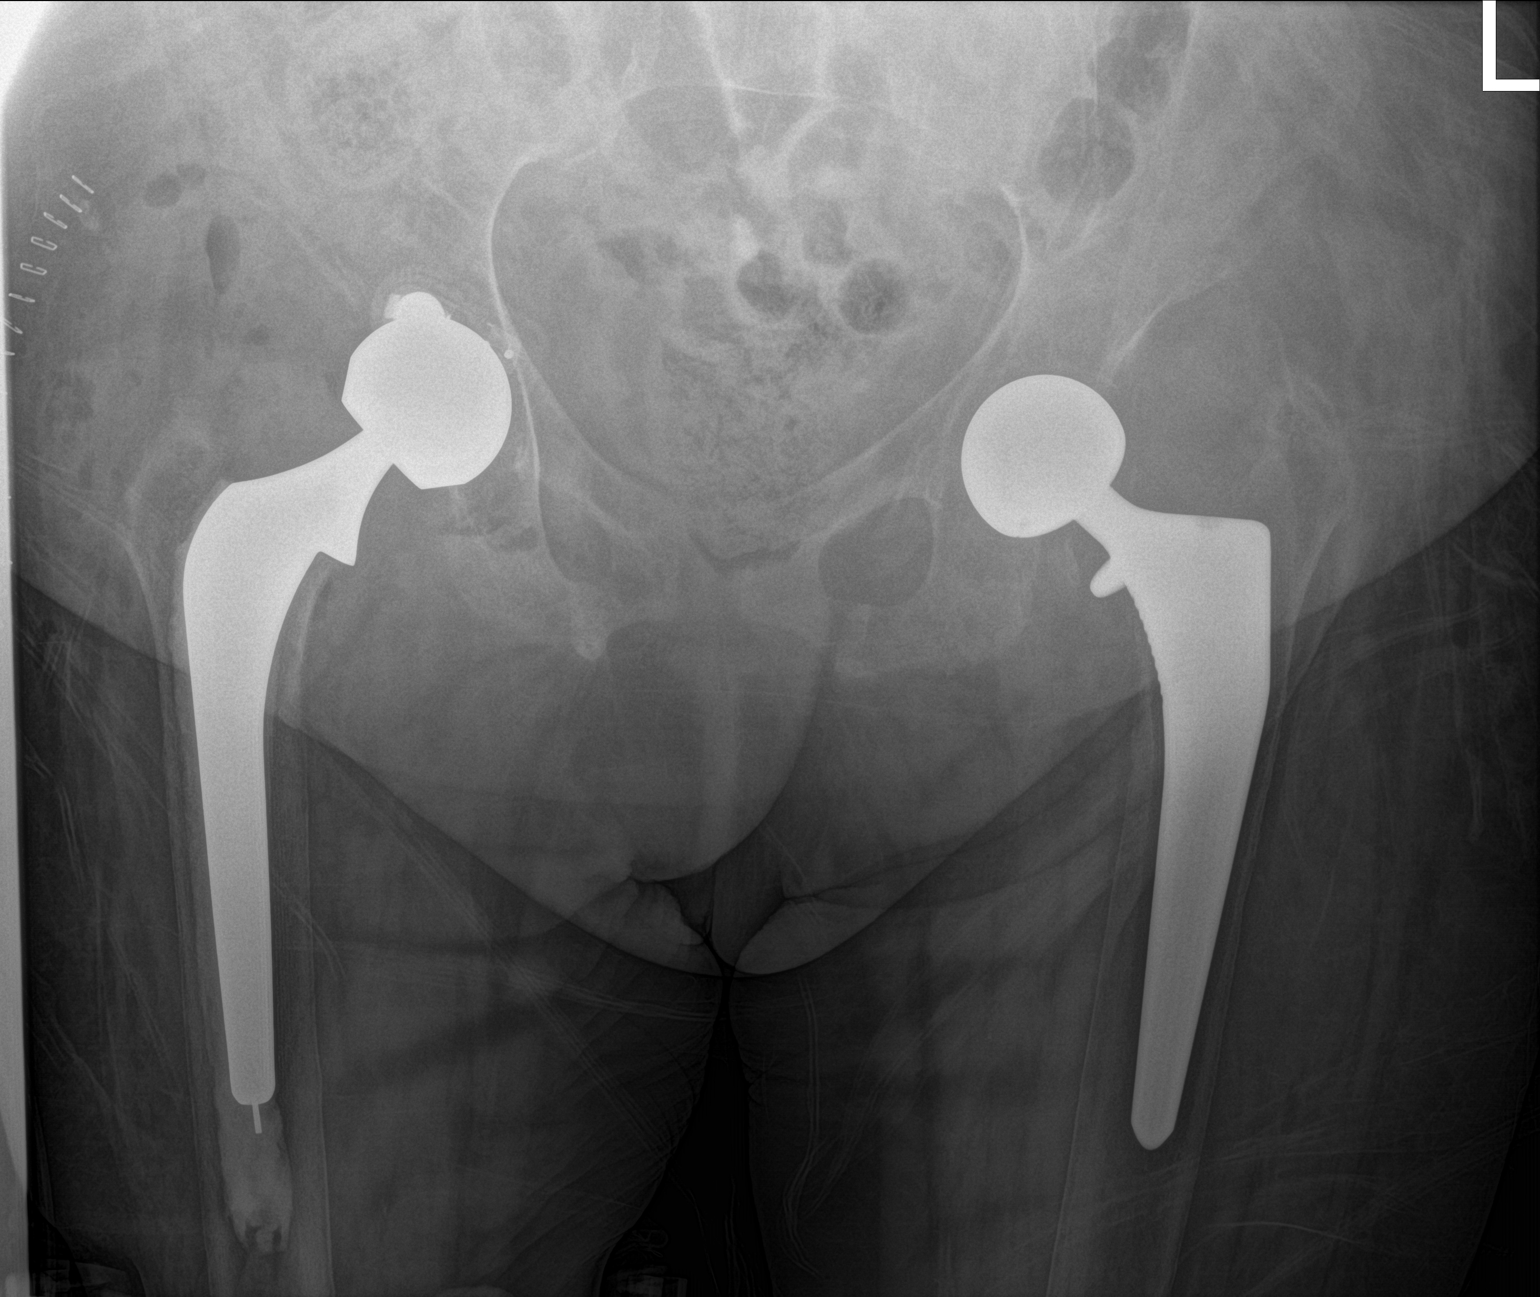

[1 of 1 positions shown; findings below may reference images not displayed]

FINDINGS: There is interval right hip arthroplasty. There is previous left hip
arthroplasty. No fracture is seen. Skin staples are seen. There are
pockets of air in the soft tissues around the right hip.
IMPRESSION: Status post right hip arthroplasty.

## 2021-12-14 IMAGING — CT CT HIP*R* W/O CM
2 of 4 series · 17 of 46 positions shown, 21 images · non-contrast
Comparison: Prior radiograph from earlier the same day.

CLINICAL DATA: Initial evaluation for acute trauma, fracture.

EXAM:
CT OF THE RIGHT HIP WITHOUT CONTRAST
TECHNIQUE: Multidetector CT imaging of the right hip was performed according to
the standard protocol. Multiplanar CT image reconstructions were
also generated.
RADIATION DOSE REDUCTION: This exam was performed according to the
departmental dose-optimization program which includes automated
exposure control, adjustment of the mA and/or kV according to
patient size and/or use of iterative reconstruction technique.

[Series 4: axial st · axial · 0.45mm/px · z∈[-1013,-820]mm · 14 of 214 slices shown, 17 images]
[im 14/214  soft-tissue]
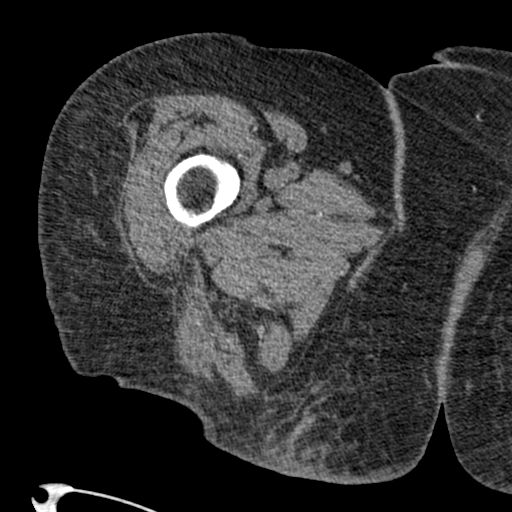
[im 14/214  bone]
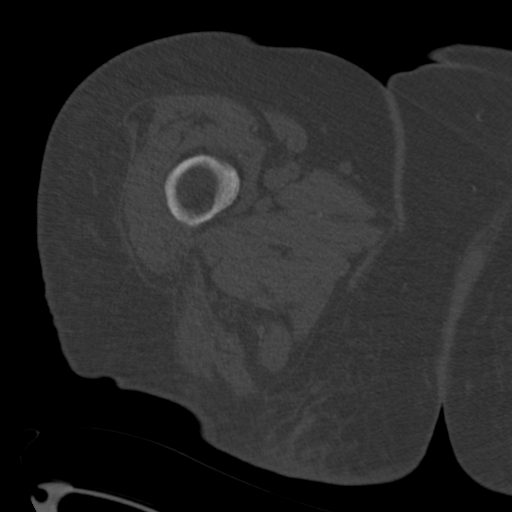
[im 35/214  soft-tissue]
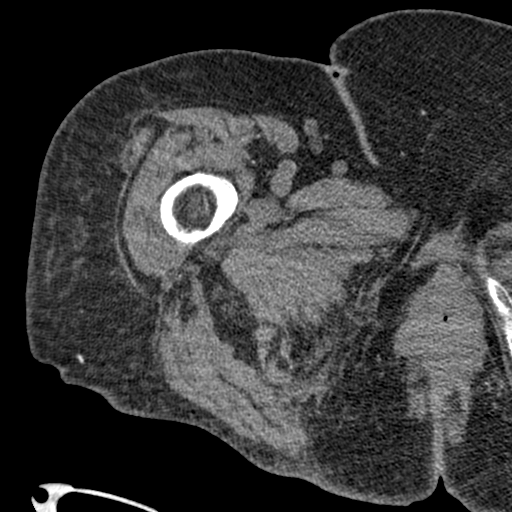
[im 49/214  soft-tissue]
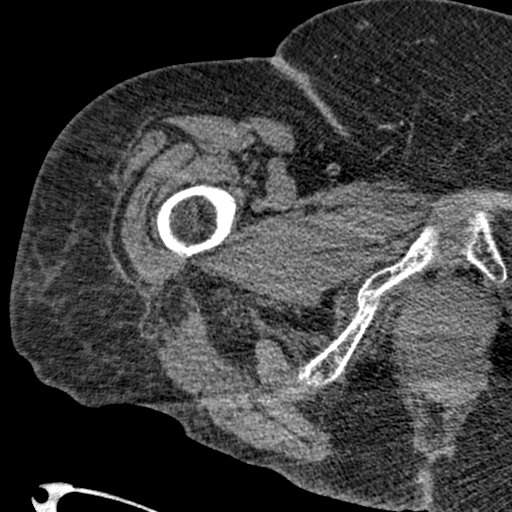
[im 69/214  soft-tissue]
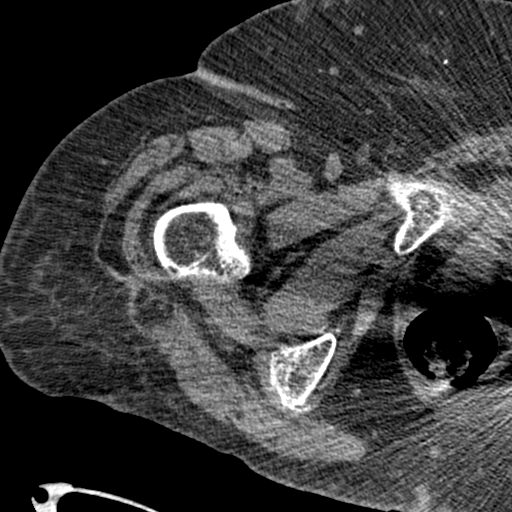
[im 90/214  soft-tissue]
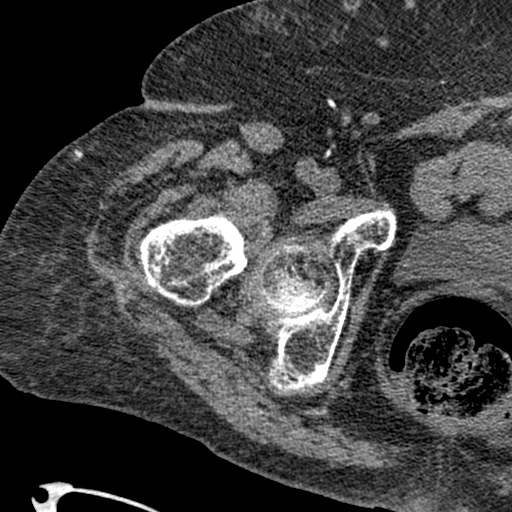
[im 110/214  soft-tissue]
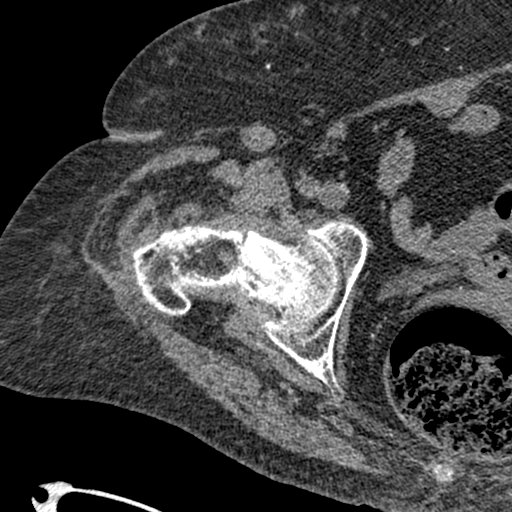
[im 124/214  soft-tissue]
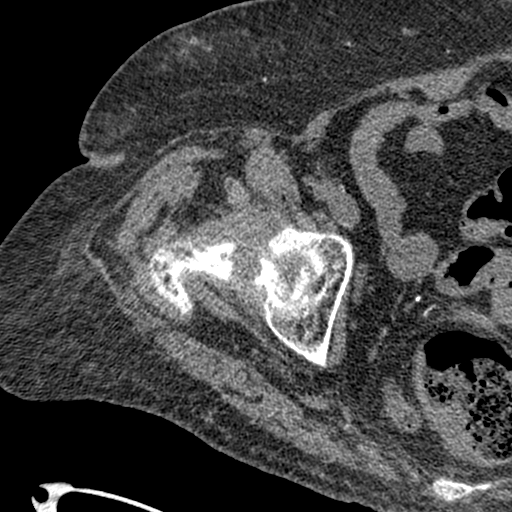
[im 145/214  soft-tissue]
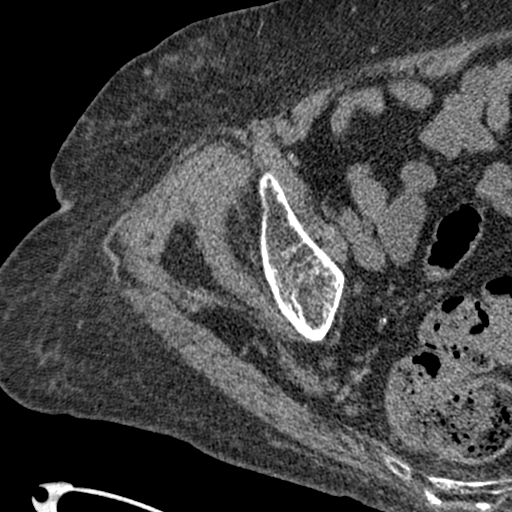
[im 165/214  soft-tissue]
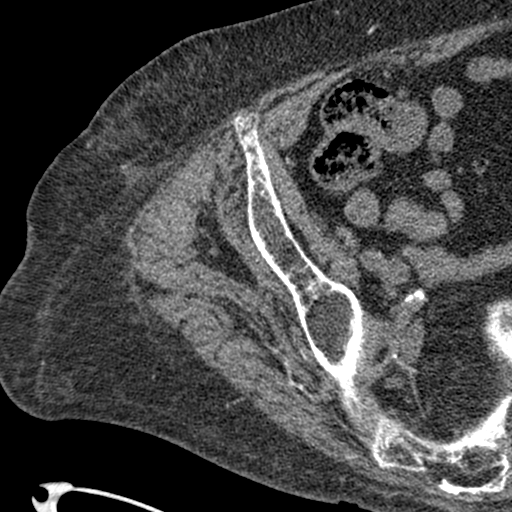
[im 165/214  bone]
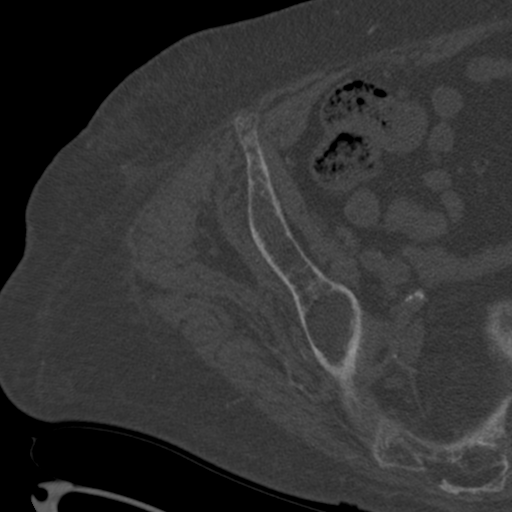
[im 179/214  soft-tissue]
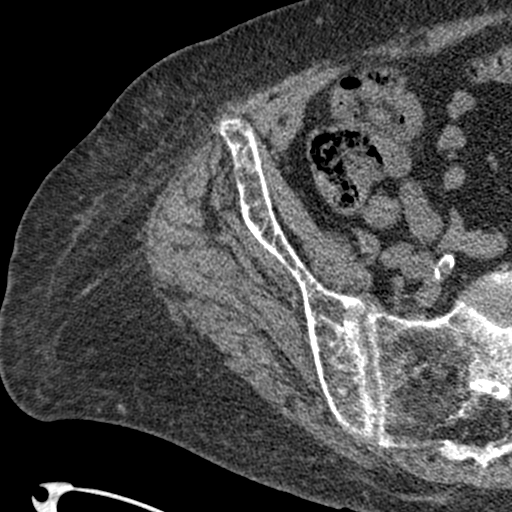
[im 186/214  lung]
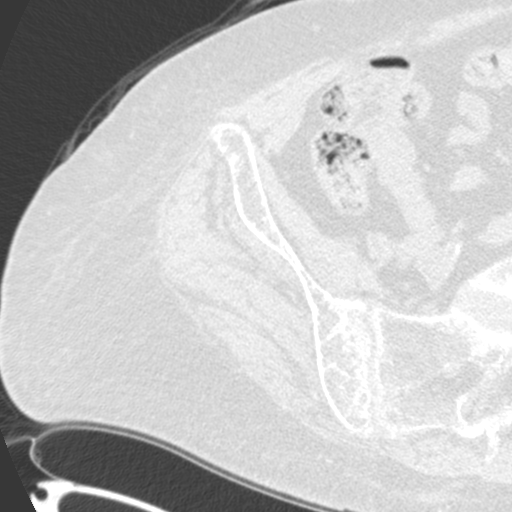
[im 193/214  lung]
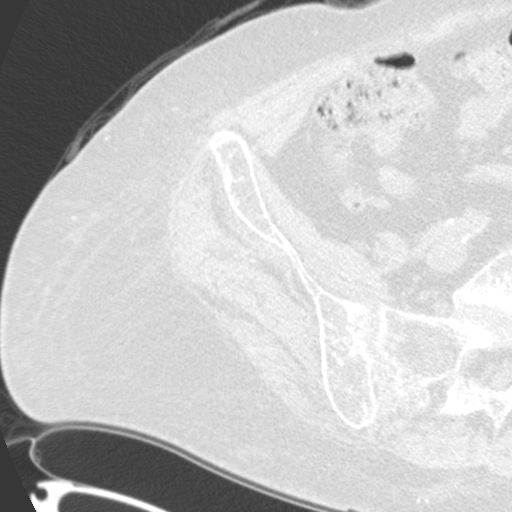
[im 200/214  soft-tissue]
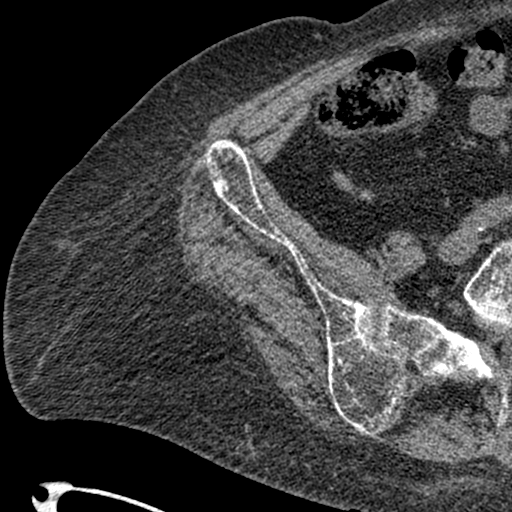
[im 200/214  lung]
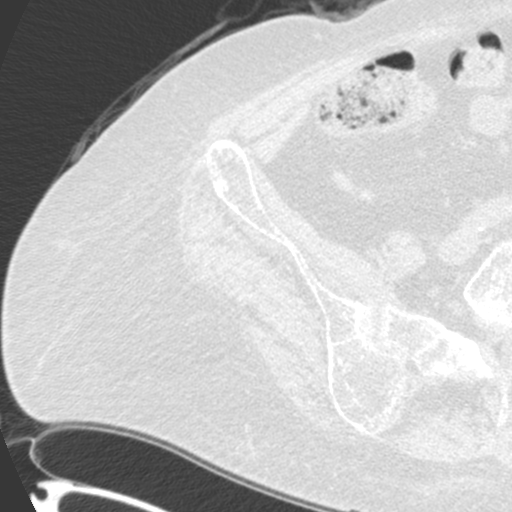
[im 207/214  lung]
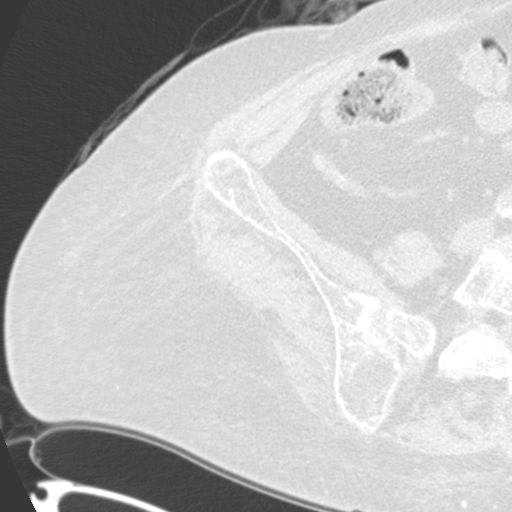

[Series 8: cor st matrix 256 · coronal · 0.84mm/px · 3 of 216 slices shown, 4 images]
[im 72/216  soft-tissue]
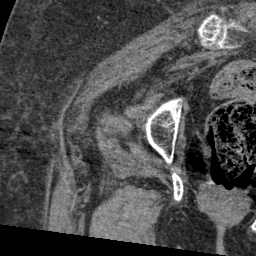
[im 96/216  soft-tissue]
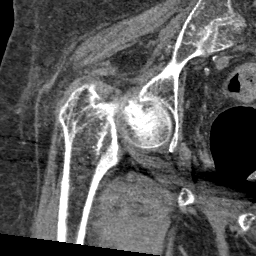
[im 96/216  bone]
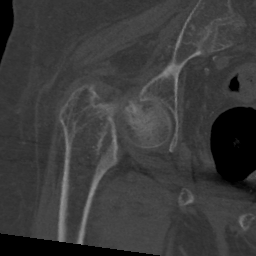
[im 120/216  soft-tissue]
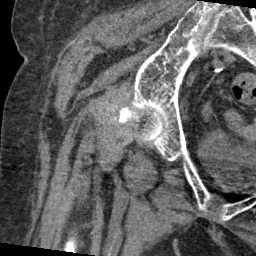

[17 of 46 positions shown; findings below may reference images not displayed]

FINDINGS: Bones/Joint/Cartilage

Acute transverse fracture seen extending through the right femoral
neck with superior subluxation. No significant comminution.
Extension through the greater trochanter noted (series 9, image 63).
Femoral head remains intact. No subtrochanteric extension. Probable
associated acute nondisplaced fracture with impaction at the
acetabular cup (series 4, image 113). Remainder of the acetabulum
otherwise grossly intact. Remotely healed fracture of the inferior
left pubic ramus. Pubis symphysis is widened up to 1.6 cm. Remainder
of the visualized bony pelvis otherwise intact. No discrete or
worrisome osseous lesions.

Ligaments

Suboptimally assessed by CT.

Muscles and Tendons

Visualized musculature intact.

Soft tissues

Soft tissue swelling and/or hemorrhage present about the acute
femoral neck fracture. Suspected mild contusion within the
subcutaneous fat overlying the hip/proximal femur.

Moderate to large volume stool within the distal colon, suggesting
constipation. Remainder of the visualized bowel unremarkable.
Appendix within normal limits. Partially visualized bladder grossly
intact. Prominent vascular calcifications noted within the pelvis.
No visible adenopathy. Small fat containing right inguinal hernia
noted.
IMPRESSION: 1. Acute transverse fracture extending through the right femoral
neck with extension through the greater trochanter. No significant
comminution or subtrochanteric extension.
2. Question subtle associated acute nondisplaced impaction fracture
at the acetabular cup.
3. Widening of the pubic symphysis up to 1.6 cm, age indeterminate.
4. Remotely healed fracture of the inferior left pubic ramus.
5. Moderate to large volume stool within the distal colon,
suggesting constipation.

## 2021-12-14 IMAGING — CR DG HIP (WITH OR WITHOUT PELVIS) 2-3V*R*
1 series · 4 of 4 positions shown · non-contrast
Comparison: None.

CLINICAL DATA: Right hip pain.

EXAM:
DG HIP (WITH OR WITHOUT PELVIS) 2-3V RIGHT

[Series 1: dg hip unilat w or w/o pelvis 2-3 views  · non-contrast · 0.14mm/px · 4 of 4 slices shown]
[im 1/4]
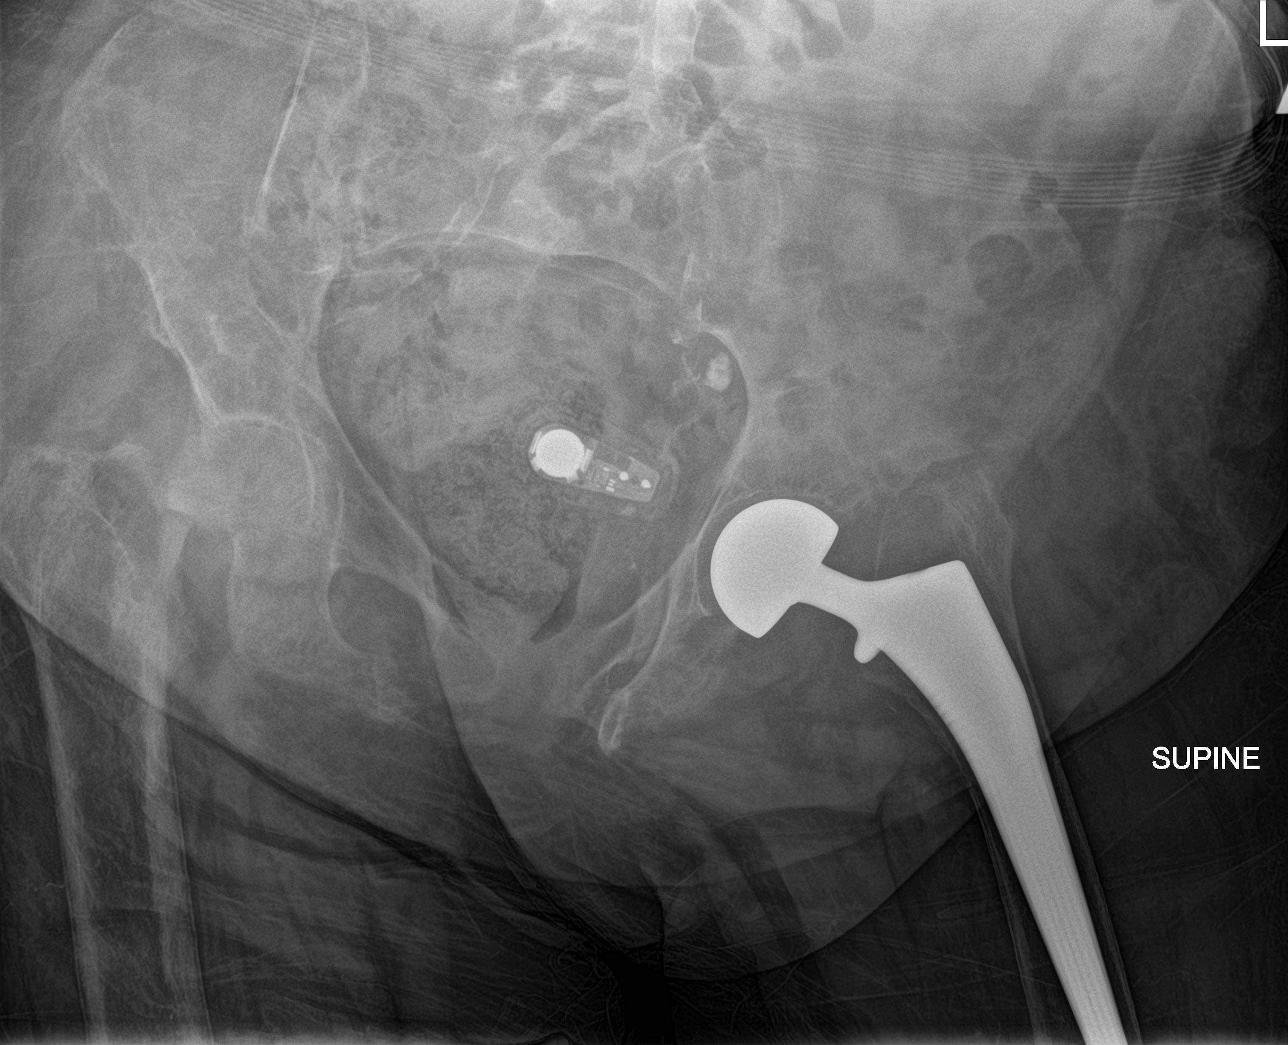
[im 2/4]
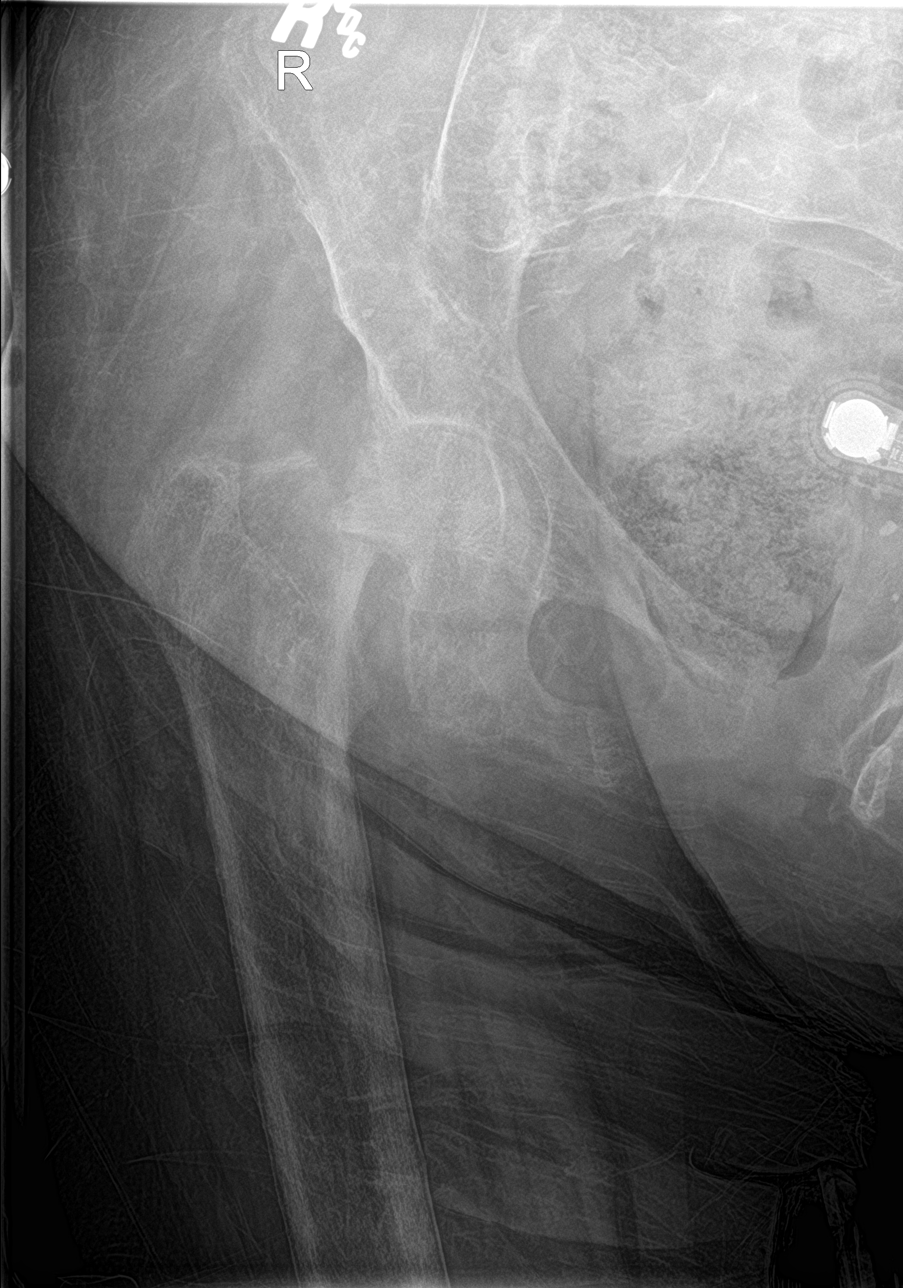
[im 3/4]
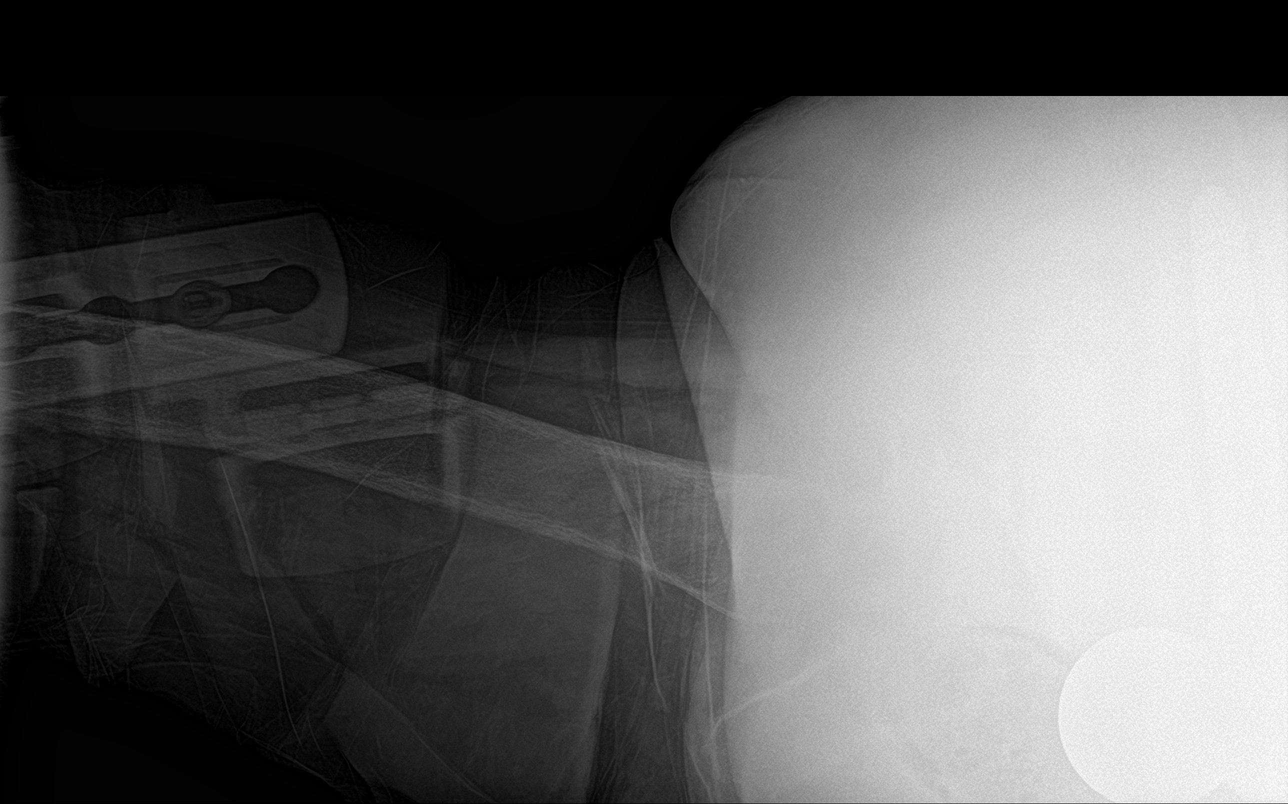
[im 4/4]
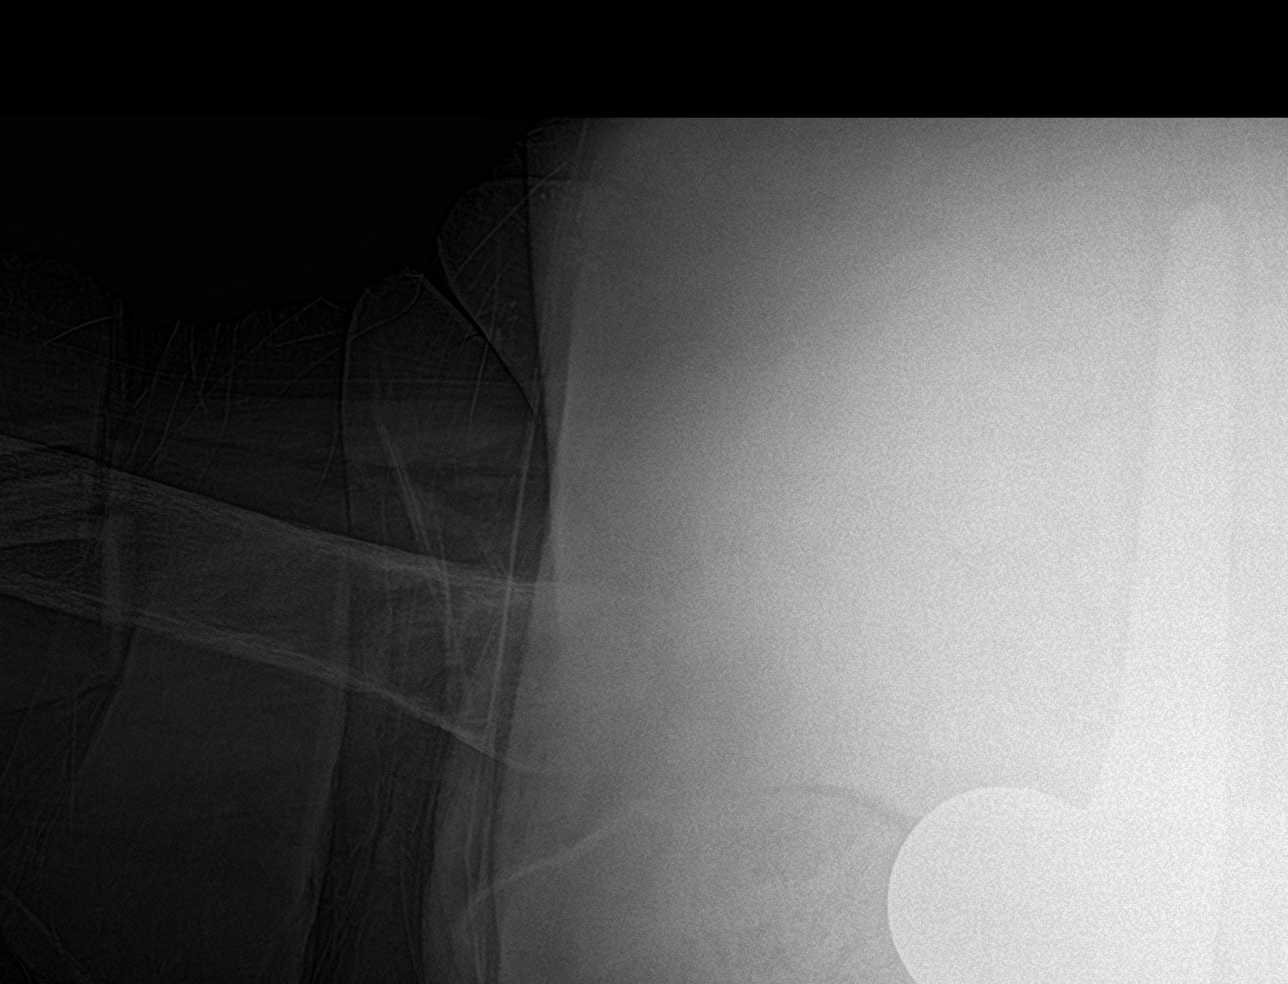

[4 of 4 positions shown; findings below may reference images not displayed]

FINDINGS: A fracture deformity is seen extending through the neck of the
proximal right femur. Approximately 1 shaft width dorsal and lateral
displacement of the distal fracture site is seen. There is no
evidence of dislocation. An intact left hip replacement is noted.
IMPRESSION: Acute fracture of the proximal right femur.

## 2021-12-14 SURGERY — HEMIARTHROPLASTY, HIP, DIRECT ANTERIOR APPROACH, FOR FRACTURE
Anesthesia: General | Site: Hip | Laterality: Right

## 2021-12-14 MED ORDER — OXYCODONE HCL 5 MG PO TABS
10.0000 mg | ORAL_TABLET | ORAL | Status: DC | PRN
  Administered 2021-12-16: 15 mg via ORAL
  Filled 2021-12-14: qty 3

## 2021-12-14 MED ORDER — CHLORHEXIDINE GLUCONATE CLOTH 2 % EX PADS
6.0000 | MEDICATED_PAD | Freq: Every day | CUTANEOUS | Status: DC
  Administered 2021-12-15 – 2021-12-18 (×3): 6 via TOPICAL

## 2021-12-14 MED ORDER — BUPIVACAINE LIPOSOME 1.3 % IJ SUSP
INTRAMUSCULAR | Status: AC
  Filled 2021-12-14: qty 20

## 2021-12-14 MED ORDER — ONDANSETRON HCL 4 MG PO TABS
4.0000 mg | ORAL_TABLET | Freq: Four times a day (QID) | ORAL | Status: DC | PRN
  Administered 2021-12-15: 4 mg via ORAL
  Filled 2021-12-14: qty 1

## 2021-12-14 MED ORDER — PHENYLEPHRINE HCL-NACL 20-0.9 MG/250ML-% IV SOLN
INTRAVENOUS | Status: AC
  Filled 2021-12-14: qty 250

## 2021-12-14 MED ORDER — DIPHENHYDRAMINE HCL 50 MG/ML IJ SOLN
INTRAMUSCULAR | Status: DC | PRN
  Administered 2021-12-14: 12.5 mg via INTRAVENOUS

## 2021-12-14 MED ORDER — HYDROMORPHONE HCL 1 MG/ML IJ SOLN: 0.2000 mg | INTRAMUSCULAR | Status: DC | PRN

## 2021-12-14 MED ORDER — KETAMINE HCL 10 MG/ML IJ SOLN
INTRAMUSCULAR | Status: DC | PRN
  Administered 2021-12-14 (×2): 20 mg via INTRAVENOUS
  Administered 2021-12-14: 10 mg via INTRAVENOUS

## 2021-12-14 MED ORDER — LIDOCAINE HCL (PF) 2 % IJ SOLN
INTRAMUSCULAR | Status: AC
  Filled 2021-12-14: qty 10

## 2021-12-14 MED ORDER — HYDROMORPHONE HCL 1 MG/ML IJ SOLN
INTRAMUSCULAR | Status: AC
  Filled 2021-12-14: qty 1

## 2021-12-14 MED ORDER — ONDANSETRON HCL 4 MG/2ML IJ SOLN
INTRAMUSCULAR | Status: AC
  Filled 2021-12-14: qty 2

## 2021-12-14 MED ORDER — INSULIN GLARGINE 100 UNIT/ML SOLOSTAR PEN
10.0000 [IU] | PEN_INJECTOR | Freq: Every day | SUBCUTANEOUS | Status: DC
Start: 2021-12-14 — End: 2021-12-14

## 2021-12-14 MED ORDER — PHENYLEPHRINE 40 MCG/ML (10ML) SYRINGE FOR IV PUSH (FOR BLOOD PRESSURE SUPPORT)
PREFILLED_SYRINGE | INTRAVENOUS | Status: AC
  Filled 2021-12-14: qty 10

## 2021-12-14 MED ORDER — POLYETHYLENE GLYCOL 3350 17 G PO PACK
17.0000 g | PACK | Freq: Every day | ORAL | Status: DC
  Administered 2021-12-14 – 2021-12-17 (×3): 17 g via ORAL
  Filled 2021-12-14 (×4): qty 1

## 2021-12-14 MED ORDER — MIDAZOLAM HCL 2 MG/2ML IJ SOLN
INTRAMUSCULAR | Status: DC | PRN
  Administered 2021-12-14: 2 mg via INTRAVENOUS

## 2021-12-14 MED ORDER — NEOMYCIN-POLYMYXIN B GU 40-200000 IR SOLN
Status: AC
  Filled 2021-12-14: qty 20

## 2021-12-14 MED ORDER — FLEET ENEMA 7-19 GM/118ML RE ENEM
1.0000 | ENEMA | Freq: Once | RECTAL | Status: DC | PRN
Start: 2021-12-14 — End: 2021-12-15

## 2021-12-14 MED ORDER — MENTHOL 3 MG MT LOZG
1.0000 | LOZENGE | OROMUCOSAL | Status: DC | PRN
  Filled 2021-12-14: qty 9

## 2021-12-14 MED ORDER — ONDANSETRON HCL 4 MG/2ML IJ SOLN
4.0000 mg | Freq: Four times a day (QID) | INTRAMUSCULAR | Status: DC | PRN
  Administered 2021-12-14: 4 mg via INTRAVENOUS

## 2021-12-14 MED ORDER — METOCLOPRAMIDE HCL 5 MG/ML IJ SOLN: 5.0000 mg | Freq: Three times a day (TID) | INTRAMUSCULAR | Status: DC | PRN

## 2021-12-14 MED ORDER — ENOXAPARIN SODIUM 30 MG/0.3ML IJ SOSY: 30.0000 mg | PREFILLED_SYRINGE | INTRAMUSCULAR | Status: DC

## 2021-12-14 MED ORDER — KETAMINE HCL 50 MG/5ML IJ SOSY
PREFILLED_SYRINGE | INTRAMUSCULAR | Status: AC
  Filled 2021-12-14: qty 5

## 2021-12-14 MED ORDER — CEFAZOLIN SODIUM-DEXTROSE 2-4 GM/100ML-% IV SOLN
INTRAVENOUS | Status: AC
  Filled 2021-12-14: qty 100

## 2021-12-14 MED ORDER — SODIUM CHLORIDE 0.9 % IV SOLN: INTRAVENOUS | Status: DC | PRN

## 2021-12-14 MED ORDER — TRANEXAMIC ACID-NACL 1000-0.7 MG/100ML-% IV SOLN: 1000.0000 mg | Freq: Once | INTRAVENOUS | Status: DC

## 2021-12-14 MED ORDER — METOPROLOL TARTRATE 5 MG/5ML IV SOLN
INTRAVENOUS | Status: AC
  Filled 2021-12-14: qty 5

## 2021-12-14 MED ORDER — DEXAMETHASONE SODIUM PHOSPHATE 10 MG/ML IJ SOLN
INTRAMUSCULAR | Status: AC
  Filled 2021-12-14: qty 1

## 2021-12-14 MED ORDER — MORPHINE SULFATE (PF) 4 MG/ML IV SOLN
4.0000 mg | Freq: Once | INTRAVENOUS | Status: AC
  Administered 2021-12-14: 4 mg via INTRAVENOUS
  Filled 2021-12-14: qty 1

## 2021-12-14 MED ORDER — ONDANSETRON HCL 4 MG/2ML IJ SOLN
4.0000 mg | Freq: Four times a day (QID) | INTRAMUSCULAR | Status: DC | PRN
  Administered 2021-12-14 – 2021-12-17 (×2): 4 mg via INTRAVENOUS
  Filled 2021-12-14 (×2): qty 2

## 2021-12-14 MED ORDER — NEOMYCIN-POLYMYXIN B GU 40-200000 IR SOLN
Status: DC | PRN
  Administered 2021-12-14: 12 mL

## 2021-12-14 MED ORDER — HYDROMORPHONE HCL 1 MG/ML IJ SOLN
1.0000 mg | Freq: Once | INTRAMUSCULAR | Status: AC
  Administered 2021-12-14: 1 mg via INTRAVENOUS
  Filled 2021-12-14: qty 1

## 2021-12-14 MED ORDER — INSULIN ASPART 100 UNIT/ML IJ SOLN
0.0000 [IU] | INTRAMUSCULAR | Status: DC
  Administered 2021-12-14: 2 [IU] via SUBCUTANEOUS
  Administered 2021-12-14: 3 [IU] via SUBCUTANEOUS
  Administered 2021-12-14 – 2021-12-15 (×2): 1 [IU] via SUBCUTANEOUS
  Administered 2021-12-15 – 2021-12-16 (×3): 2 [IU] via SUBCUTANEOUS
  Administered 2021-12-16: 1 [IU] via SUBCUTANEOUS
  Filled 2021-12-14 (×8): qty 1

## 2021-12-14 MED ORDER — DOCUSATE SODIUM 100 MG PO CAPS
100.0000 mg | ORAL_CAPSULE | Freq: Two times a day (BID) | ORAL | Status: DC
  Administered 2021-12-14 – 2021-12-17 (×3): 100 mg via ORAL
  Filled 2021-12-14 (×4): qty 1

## 2021-12-14 MED ORDER — PROPOFOL 1000 MG/100ML IV EMUL
INTRAVENOUS | Status: AC
  Filled 2021-12-14: qty 200

## 2021-12-14 MED ORDER — BISACODYL 10 MG RE SUPP: 10.0000 mg | Freq: Every day | RECTAL | Status: DC | PRN

## 2021-12-14 MED ORDER — METHOCARBAMOL 500 MG PO TABS
750.0000 mg | ORAL_TABLET | Freq: Three times a day (TID) | ORAL | Status: DC | PRN
  Administered 2021-12-14 – 2021-12-18 (×10): 750 mg via ORAL
  Filled 2021-12-14 (×10): qty 2

## 2021-12-14 MED ORDER — STERILE WATER FOR IRRIGATION IR SOLN
Status: DC | PRN
  Administered 2021-12-14: 500 mL

## 2021-12-14 MED ORDER — HYDROMORPHONE HCL 2 MG PO TABS: 2.0000 mg | ORAL_TABLET | ORAL | Status: DC | PRN

## 2021-12-14 MED ORDER — FENTANYL CITRATE (PF) 100 MCG/2ML IJ SOLN
INTRAMUSCULAR | Status: DC | PRN
Start: 2021-12-14 — End: 2021-12-14
  Administered 2021-12-14: 50 ug via INTRAVENOUS
  Administered 2021-12-14 (×2): 25 ug via INTRAVENOUS

## 2021-12-14 MED ORDER — ACETAMINOPHEN 500 MG PO TABS
1000.0000 mg | ORAL_TABLET | Freq: Three times a day (TID) | ORAL | Status: DC
  Administered 2021-12-14 – 2021-12-18 (×11): 1000 mg via ORAL
  Filled 2021-12-14 (×12): qty 2

## 2021-12-14 MED ORDER — OXYCODONE HCL 5 MG PO TABS
5.0000 mg | ORAL_TABLET | ORAL | Status: DC | PRN
  Administered 2021-12-17: 5 mg via ORAL
  Filled 2021-12-14: qty 1

## 2021-12-14 MED ORDER — FENTANYL CITRATE (PF) 100 MCG/2ML IJ SOLN
INTRAMUSCULAR | Status: AC
  Filled 2021-12-14: qty 2

## 2021-12-14 MED ORDER — BUPIVACAINE LIPOSOME 1.3 % IJ SUSP
INTRAMUSCULAR | Status: DC | PRN
  Administered 2021-12-14: 50 mL

## 2021-12-14 MED ORDER — PHENOL 1.4 % MT LIQD
1.0000 | OROMUCOSAL | Status: DC | PRN
  Filled 2021-12-14: qty 177

## 2021-12-14 MED ORDER — HYDROMORPHONE HCL 2 MG PO TABS: 1.0000 mg | ORAL_TABLET | ORAL | Status: DC | PRN

## 2021-12-14 MED ORDER — BUPIVACAINE HCL (PF) 0.5 % IJ SOLN
INTRAMUSCULAR | Status: AC
  Filled 2021-12-14: qty 10

## 2021-12-14 MED ORDER — TRANEXAMIC ACID-NACL 1000-0.7 MG/100ML-% IV SOLN
INTRAVENOUS | Status: AC
  Filled 2021-12-14: qty 100

## 2021-12-14 MED ORDER — ROCURONIUM BROMIDE 10 MG/ML (PF) SYRINGE
PREFILLED_SYRINGE | INTRAVENOUS | Status: AC
  Filled 2021-12-14: qty 10

## 2021-12-14 MED ORDER — FENTANYL CITRATE (PF) 100 MCG/2ML IJ SOLN: 25.0000 ug | INTRAMUSCULAR | Status: DC | PRN

## 2021-12-14 MED ORDER — LIDOCAINE HCL (CARDIAC) PF 100 MG/5ML IV SOSY
PREFILLED_SYRINGE | INTRAVENOUS | Status: DC | PRN
  Administered 2021-12-14: 100 mg via INTRAVENOUS

## 2021-12-14 MED ORDER — MIDAZOLAM HCL 2 MG/2ML IJ SOLN
INTRAMUSCULAR | Status: AC
  Filled 2021-12-14: qty 2

## 2021-12-14 MED ORDER — ROCURONIUM BROMIDE 100 MG/10ML IV SOLN
INTRAVENOUS | Status: DC | PRN
  Administered 2021-12-14: 50 mg via INTRAVENOUS
  Administered 2021-12-14: 30 mg via INTRAVENOUS
  Administered 2021-12-14: 20 mg via INTRAVENOUS
  Administered 2021-12-14: 10 mg via INTRAVENOUS

## 2021-12-14 MED ORDER — PROMETHAZINE HCL 25 MG/ML IJ SOLN: 6.2500 mg | INTRAMUSCULAR | Status: DC | PRN

## 2021-12-14 MED ORDER — TRANEXAMIC ACID-NACL 1000-0.7 MG/100ML-% IV SOLN
INTRAVENOUS | Status: DC | PRN
  Administered 2021-12-14 (×2): 1000 mg via INTRAVENOUS

## 2021-12-14 MED ORDER — HYDROCODONE-ACETAMINOPHEN 5-325 MG PO TABS
1.0000 | ORAL_TABLET | Freq: Four times a day (QID) | ORAL | Status: DC | PRN
  Administered 2021-12-14: 2 via ORAL
  Filled 2021-12-14: qty 2

## 2021-12-14 MED ORDER — POVIDONE-IODINE 10 % EX SOLN
CUTANEOUS | Status: DC | PRN
  Administered 2021-12-14: 1 via TOPICAL

## 2021-12-14 MED ORDER — ACETAMINOPHEN 10 MG/ML IV SOLN
INTRAVENOUS | Status: DC | PRN
Start: 2021-12-14 — End: 2021-12-14
  Administered 2021-12-14: 1000 mg via INTRAVENOUS

## 2021-12-14 MED ORDER — PHENYLEPHRINE HCL-NACL 20-0.9 MG/250ML-% IV SOLN
INTRAVENOUS | Status: DC | PRN
  Administered 2021-12-14: 50 ug/min via INTRAVENOUS

## 2021-12-14 MED ORDER — PROPOFOL 500 MG/50ML IV EMUL
INTRAVENOUS | Status: AC
  Filled 2021-12-14: qty 50

## 2021-12-14 MED ORDER — MORPHINE SULFATE (PF) 2 MG/ML IV SOLN
0.5000 mg | INTRAVENOUS | Status: DC | PRN
  Administered 2021-12-14 (×2): 0.5 mg via INTRAVENOUS
  Filled 2021-12-14 (×2): qty 1

## 2021-12-14 MED ORDER — PROPOFOL 10 MG/ML IV BOLUS
INTRAVENOUS | Status: DC | PRN
  Administered 2021-12-14: 20 mg via INTRAVENOUS
  Administered 2021-12-14: 40 mg via INTRAVENOUS
  Administered 2021-12-14: 140 mg via INTRAVENOUS

## 2021-12-14 MED ORDER — CEFAZOLIN SODIUM-DEXTROSE 2-4 GM/100ML-% IV SOLN
2.0000 g | Freq: Four times a day (QID) | INTRAVENOUS | Status: AC
  Administered 2021-12-14 (×2): 2 g via INTRAVENOUS
  Filled 2021-12-14 (×2): qty 100

## 2021-12-14 MED ORDER — TRAMADOL HCL 50 MG PO TABS
50.0000 mg | ORAL_TABLET | Freq: Four times a day (QID) | ORAL | Status: DC | PRN
  Administered 2021-12-17: 50 mg via ORAL
  Filled 2021-12-14: qty 1

## 2021-12-14 MED ORDER — METHOCARBAMOL 1000 MG/10ML IJ SOLN
500.0000 mg | Freq: Once | INTRAVENOUS | Status: DC
  Filled 2021-12-14: qty 5

## 2021-12-14 MED ORDER — SUGAMMADEX SODIUM 200 MG/2ML IV SOLN
INTRAVENOUS | Status: DC | PRN
  Administered 2021-12-14: 200 mg via INTRAVENOUS

## 2021-12-14 MED ORDER — BUPIVACAINE HCL (PF) 0.5 % IJ SOLN
INTRAMUSCULAR | Status: AC
  Filled 2021-12-14: qty 30

## 2021-12-14 MED ORDER — DEXAMETHASONE SODIUM PHOSPHATE 10 MG/ML IJ SOLN
INTRAMUSCULAR | Status: DC | PRN
  Administered 2021-12-14: 5 mg via INTRAVENOUS

## 2021-12-14 MED ORDER — DIPHENHYDRAMINE HCL 50 MG/ML IJ SOLN
INTRAMUSCULAR | Status: AC
  Filled 2021-12-14: qty 1

## 2021-12-14 MED ORDER — CEFAZOLIN SODIUM-DEXTROSE 2-4 GM/100ML-% IV SOLN
2.0000 g | Freq: Once | INTRAVENOUS | Status: AC
  Administered 2021-12-14: 2 g via INTRAVENOUS
  Filled 2021-12-14 (×2): qty 100

## 2021-12-14 MED ORDER — HEPARIN SODIUM (PORCINE) 5000 UNIT/ML IJ SOLN
5000.0000 [IU] | Freq: Three times a day (TID) | INTRAMUSCULAR | Status: DC
Start: 2021-12-15 — End: 2021-12-18
  Administered 2021-12-15 (×2): 5000 [IU] via SUBCUTANEOUS
  Filled 2021-12-14 (×4): qty 1

## 2021-12-14 MED ORDER — ONDANSETRON HCL 4 MG/2ML IJ SOLN
INTRAMUSCULAR | Status: DC | PRN
  Administered 2021-12-14: 4 mg via INTRAVENOUS

## 2021-12-14 MED ORDER — METOPROLOL TARTRATE 5 MG/5ML IV SOLN
INTRAVENOUS | Status: DC | PRN
Start: 2021-12-14 — End: 2021-12-14
  Administered 2021-12-14: 2 mg via INTRAVENOUS

## 2021-12-14 MED ORDER — METHOCARBAMOL 1000 MG/10ML IJ SOLN
750.0000 mg | Freq: Once | INTRAVENOUS | Status: DC
  Filled 2021-12-14: qty 7.5

## 2021-12-14 MED ORDER — SENNOSIDES-DOCUSATE SODIUM 8.6-50 MG PO TABS: 1.0000 | ORAL_TABLET | Freq: Every evening | ORAL | Status: DC | PRN

## 2021-12-14 MED ORDER — PHENYLEPHRINE 40 MCG/ML (10ML) SYRINGE FOR IV PUSH (FOR BLOOD PRESSURE SUPPORT)
PREFILLED_SYRINGE | INTRAVENOUS | Status: DC | PRN
  Administered 2021-12-14: 40 ug via INTRAVENOUS
  Administered 2021-12-14 (×2): 80 ug via INTRAVENOUS
  Administered 2021-12-14: 200 ug via INTRAVENOUS
  Administered 2021-12-14 (×2): 80 ug via INTRAVENOUS

## 2021-12-14 MED ORDER — HYDROMORPHONE HCL 1 MG/ML IJ SOLN
INTRAMUSCULAR | Status: DC | PRN
  Administered 2021-12-14 (×2): .25 mg via INTRAVENOUS

## 2021-12-14 MED ORDER — SODIUM CHLORIDE 0.9 % IV SOLN: INTRAVENOUS | Status: DC

## 2021-12-14 MED ORDER — PROPOFOL 500 MG/50ML IV EMUL
INTRAVENOUS | Status: DC | PRN
  Administered 2021-12-14: 175 ug/kg/min via INTRAVENOUS

## 2021-12-14 MED ORDER — LORAZEPAM 2 MG/ML IJ SOLN
1.0000 mg | Freq: Once | INTRAMUSCULAR | Status: AC
  Administered 2021-12-14: 1 mg via INTRAVENOUS
  Filled 2021-12-14: qty 1

## 2021-12-14 MED ORDER — 0.9 % SODIUM CHLORIDE (POUR BTL) OPTIME
TOPICAL | Status: DC | PRN
  Administered 2021-12-14: 500 mL

## 2021-12-14 MED ORDER — TRANEXAMIC ACID 1000 MG/10ML IV SOLN
INTRAVENOUS | Status: AC
  Filled 2021-12-14: qty 10

## 2021-12-14 MED ORDER — SENNOSIDES-DOCUSATE SODIUM 8.6-50 MG PO TABS
1.0000 | ORAL_TABLET | Freq: Two times a day (BID) | ORAL | Status: DC
  Administered 2021-12-14 – 2021-12-17 (×3): 1 via ORAL
  Filled 2021-12-14 (×4): qty 1

## 2021-12-14 MED ORDER — ACETAMINOPHEN 10 MG/ML IV SOLN
INTRAVENOUS | Status: AC
  Filled 2021-12-14: qty 100

## 2021-12-14 MED ORDER — METOCLOPRAMIDE HCL 10 MG PO TABS: 5.0000 mg | ORAL_TABLET | Freq: Three times a day (TID) | ORAL | Status: DC | PRN

## 2021-12-14 SURGICAL SUPPLY — 81 items
BLADE SAW SGTL 13X75X1.27 (BLADE) ×2 IMPLANT
BLADE SURG SZ10 CARB STEEL (BLADE) ×2 IMPLANT
BNDG COHESIVE 4X5 TAN ST LF (GAUZE/BANDAGES/DRESSINGS) ×2 IMPLANT
CANISTER WOUND CARE 500ML ATS (WOUND CARE) ×1 IMPLANT
CEMENT GMV SMARTSET GENT 40G (Cement) ×2 IMPLANT
CHLORAPREP W/TINT 26 (MISCELLANEOUS) ×2 IMPLANT
COVER BACK TABLE REUSABLE LG (DRAPES) ×2 IMPLANT
COVER MAYO STAND STRL (DRAPES) ×2 IMPLANT
DERMABOND ADVANCED (GAUZE/BANDAGES/DRESSINGS) ×1
DERMABOND ADVANCED .7 DNX12 (GAUZE/BANDAGES/DRESSINGS) ×1 IMPLANT
DRAPE 3/4 80X56 (DRAPES) ×6 IMPLANT
DRAPE INCISE IOBAN 66X60 STRL (DRAPES) ×2 IMPLANT
DRAPE ORTHO SPLIT 77X108 STRL (DRAPES) ×1
DRAPE SURG 17X11 SM STRL (DRAPES) ×2 IMPLANT
DRAPE SURG ORHT 6 SPLT 77X108 (DRAPES) ×1 IMPLANT
DRAPE U-SHAPE 47X51 STRL (DRAPES) ×2 IMPLANT
DRSG MEPILEX SACRM 8.7X9.8 (GAUZE/BANDAGES/DRESSINGS) ×1 IMPLANT
DRSG OPSITE POSTOP 4X10 (GAUZE/BANDAGES/DRESSINGS) ×2 IMPLANT
DRSG OPSITE POSTOP 4X8 (GAUZE/BANDAGES/DRESSINGS) ×2 IMPLANT
ELECT CAUTERY BLADE 6.4 (BLADE) ×2 IMPLANT
ELECT REM PT RETURN 9FT ADLT (ELECTROSURGICAL) ×2
ELECTRODE REM PT RTRN 9FT ADLT (ELECTROSURGICAL) ×1 IMPLANT
GAUZE 4X4 16PLY ~~LOC~~+RFID DBL (SPONGE) ×2 IMPLANT
GAUZE SPONGE 4X4 12PLY STRL (GAUZE/BANDAGES/DRESSINGS) ×2 IMPLANT
GAUZE XEROFORM 1X8 LF (GAUZE/BANDAGES/DRESSINGS) ×2 IMPLANT
GLOVE BIOGEL PI IND STRL 8 (GLOVE) ×2 IMPLANT
GLOVE BIOGEL PI INDICATOR 8 (GLOVE) ×2
GLOVE SURG ORTHO 8.0 STRL STRW (GLOVE) ×4 IMPLANT
GOWN STRL REUS W/ TWL LRG LVL3 (GOWN DISPOSABLE) ×1 IMPLANT
GOWN STRL REUS W/ TWL XL LVL3 (GOWN DISPOSABLE) ×1 IMPLANT
GOWN STRL REUS W/TWL LRG LVL3 (GOWN DISPOSABLE) ×1
GOWN STRL REUS W/TWL XL LVL3 (GOWN DISPOSABLE) ×1
HEAD MODULAR ENDO (Orthopedic Implant) ×1 IMPLANT
HEAD UNPLR 43XMDLR STRL HIP (Orthopedic Implant) IMPLANT
HEMOVAC 400ML (MISCELLANEOUS)
HOLSTER ELECTROSUGICAL PENCIL (MISCELLANEOUS) ×2 IMPLANT
IV NS IRRIG 3000ML ARTHROMATIC (IV SOLUTION) ×2 IMPLANT
KIT DRAIN HEMOVAC JP 7FR 400ML (MISCELLANEOUS) IMPLANT
KIT PREP HIP W/CEMENT RESTRICT (Miscellaneous) IMPLANT
KIT PREPARATION TOTAL HIP (Miscellaneous) ×1 IMPLANT
KIT PREVENA INCISION MGT20CM45 (CANNISTER) ×1 IMPLANT
KIT TURNOVER KIT A (KITS) ×2 IMPLANT
MANIFOLD NEPTUNE II (INSTRUMENTS) ×4 IMPLANT
NDL FILTER BLUNT 18X1 1/2 (NEEDLE) ×1 IMPLANT
NDL MAYO CATGUT SZ4 TPR NDL (NEEDLE) ×1 IMPLANT
NDL SAFETY ECLIPSE 18X1.5 (NEEDLE) ×1 IMPLANT
NEEDLE FILTER BLUNT 18X 1/2SAF (NEEDLE) ×1
NEEDLE FILTER BLUNT 18X1 1/2 (NEEDLE) ×1 IMPLANT
NEEDLE HYPO 18GX1.5 SHARP (NEEDLE) ×1
NEEDLE HYPO 22GX1.5 SAFETY (NEEDLE) ×2 IMPLANT
NEEDLE MAYO CATGUT SZ4 (NEEDLE) ×2 IMPLANT
NS IRRIG 1000ML POUR BTL (IV SOLUTION) ×1 IMPLANT
PACK HIP PROSTHESIS (MISCELLANEOUS) ×2 IMPLANT
PENCIL SMOKE EVACUATOR (MISCELLANEOUS) ×2 IMPLANT
PILLOW ABDUCTION FOAM SM (MISCELLANEOUS) ×2 IMPLANT
PULSAVAC PLUS IRRIG FAN TIP (DISPOSABLE) ×2
RETRIEVER SUT HEWSON (MISCELLANEOUS) IMPLANT
SET BERKELEY SUCTION TUBING (SUCTIONS) ×1 IMPLANT
SLEEVE UNITRAX V40 (Orthopedic Implant) ×1 IMPLANT
SLEEVE UNITRAX V40 +4 (Orthopedic Implant) IMPLANT
SPACER OSTEO CEMENT (Orthopedic Implant) ×1 IMPLANT
SPACER OSTEO CEMENT 13HIP (Orthopedic Implant) IMPLANT
SPONGE T-LAP 18X18 ~~LOC~~+RFID (SPONGE) ×8 IMPLANT
STAPLER SKIN PROX 35W (STAPLE) ×2 IMPLANT
STEM FEM CMT SZ7 V40 40X158 (Stem) ×1 IMPLANT
SUT ETHIBOND #5 BRAIDED 30INL (SUTURE) ×2 IMPLANT
SUT MNCRL 4-0 (SUTURE) ×1
SUT MNCRL 4-0 27XMFL (SUTURE) ×1
SUT VIC AB 0 CT1 36 (SUTURE) ×3 IMPLANT
SUT VIC AB 2-0 CT2 27 (SUTURE) ×4 IMPLANT
SUTURE MNCRL 4-0 27XMF (SUTURE) ×1 IMPLANT
SYR 20ML LL LF (SYRINGE) ×2 IMPLANT
SYR 50ML LL SCALE MARK (SYRINGE) ×2 IMPLANT
TAPE MICROFOAM 4IN (TAPE) ×2 IMPLANT
TAPE TRANSPORE STRL 2 31045 (GAUZE/BANDAGES/DRESSINGS) ×2 IMPLANT
TIP BRUSH PULSAVAC PLUS 24.33 (MISCELLANEOUS) ×2 IMPLANT
TIP FAN IRRIG PULSAVAC PLUS (DISPOSABLE) ×1 IMPLANT
TUBE KAMVAC SUCTION (TUBING) ×4 IMPLANT
TUBE SUCT KAM VAC (TUBING) ×2 IMPLANT
WATER STERILE IRR 1000ML POUR (IV SOLUTION) ×2 IMPLANT
WATER STERILE IRR 500ML POUR (IV SOLUTION) ×2 IMPLANT

## 2021-12-14 NOTE — Assessment & Plan Note (Addendum)
-   continue SSI ?- resume basal as able ?

## 2021-12-14 NOTE — Anesthesia Procedure Notes (Signed)
Procedure Name: Intubation ?Date/Time: 12/14/2021 10:36 AM ?Performed by: Lia Foyer, CRNA ?Pre-anesthesia Checklist: Patient identified, Emergency Drugs available, Suction available and Patient being monitored ?Patient Re-evaluated:Patient Re-evaluated prior to induction ?Oxygen Delivery Method: Circle system utilized ?Preoxygenation: Pre-oxygenation with 100% oxygen ?Induction Type: IV induction ?Ventilation: Mask ventilation without difficulty ?Laryngoscope Size: McGraph and 3 ?Grade View: Grade I ?Tube type: Oral ?Tube size: 7.0 mm ?Number of attempts: 1 ?Airway Equipment and Method: Stylet and Video-laryngoscopy ?Placement Confirmation: ETT inserted through vocal cords under direct vision, positive ETCO2 and breath sounds checked- equal and bilateral ?Secured at: 19 cm ?Tube secured with: Tape ?Dental Injury: Teeth and Oropharynx as per pre-operative assessment  ? ? ? ? ?

## 2021-12-14 NOTE — Anesthesia Preprocedure Evaluation (Signed)
Anesthesia Evaluation  ?Patient identified by MRN, date of birth, ID band ?Patient awake ? ? ? ?Reviewed: ?Allergy & Precautions, NPO status , Patient's Chart, lab work & pertinent test results ? ?History of Anesthesia Complications ?(+) PONV and history of anesthetic complications ? ?Airway ?Mallampati: III ? ?TM Distance: >3 FB ?Neck ROM: full ? ? ? Dental ? ?(+) Chipped, Dental Advidsory Given ?  ?Pulmonary ?neg shortness of breath, asthma , neg sleep apnea, neg recent URI,  ?  ? ?+ decreased breath sounds+ wheezing ? ? ? ? ? Cardiovascular ?hypertension, (-) angina(-) Past MI and (-) Cardiac Stents Normal cardiovascular exam(-) dysrhythmias (-) Valvular Problems/Murmurs ? ? ?  ?Neuro/Psych ?neg Seizures PSYCHIATRIC DISORDERS Anxiety  Neuromuscular disease CVA   ? GI/Hepatic ?negative GI ROS, Neg liver ROS, GERD  Controlled,  ?Endo/Other  ?diabetes, Type 2 ? Renal/GU ?DialysisRenal disease  ? ?  ?Musculoskeletal ? ? Abdominal ?  ?Peds ? Hematology ?negative hematology ROS ?(+)   ?Anesthesia Other Findings ?Past Medical History: ?No date: Anemia ?    Comment:  vitamin d3 deficiency ?No date: Anxiety ?No date: Chronic kidney disease ?    Comment:  End Stage Renal Disease ?No date: Diabetes mellitus without complication ([facility]) ?No date: GERD (gastroesophageal reflux disease) ?    Comment:  nothing over last few years ?No date: High serum parathyroid hormone (P[facility]) ?    Comment:  checked through Dialysis ?2000: History of kidney stones ?No date: Hypertension ?No date: Neuromuscular disorder ([place]) ?    Comment:  neuropathy in feet ?No date: PONV (postoperative nausea and vomiting) ?    Comment:  severe nausea requiring many doses of post op  ?             antiemetics ?10/2017: Stroke [facility]) ?    Comment:  thinks she had a series of mini strokes.right leg up to  ?             right side of face were numb. no loss of consciousness ? ?Past Surgical History: ?2007: ABDOMINAL  HYSTERECTOMY ?12/08/2017: AV FISTULA INSERTION W/ RF MAGNETIC GUIDANCE; N/A ?    Comment:  Procedure: AV FISTULA INSERTION W/RF MAGNETIC GUIDANCE;  ?             Surgeon: Katha Cabal, MD;  Location: [facility] INVASIVE ?             CV LAB;  Service: Cardiovascular;  Laterality: N/A; ?2004: ROTATOR CUFF REPAIR; Right ?2004: SHOULDER CLOSED REDUCTION; Right ?02/15/2018: UPPER EXTREMITY VENOGRAPHY; Left ?    Comment:  Procedure: UPPER EXTREMITY VENOGRAPHY;  Surgeon:  ?             Delana Meyer Dolores Lory, MD;  Location: [place] INVASIVE CV LAB;  ?             Service: Cardiovascular;  Laterality: Left; ? ?BMI   ? Body Mass Index: 30.04 kg/m?  ?  ? ? Reproductive/Obstetrics ?negative OB ROS ? ?  ? ? ? ? ? ? ? ? ? ? ? ? ? ?  ?  ? ? ? ? ? ? ? ? ?Anesthesia Physical ? ?Anesthesia Plan ? ?ASA: 4 ? ?Anesthesia Plan: General  ? ?Post-op Pain Management:   ? ?Induction: Intravenous ? ?PONV Risk Score and Plan: 4 or greater and Midazolam, Treatment may vary due to age or medical condition, TIVA and Propofol infusion ? ?Airway Management Planned: Oral ETT ? ?Additional Equipment:  ? ?Intra-op Plan:  ? ?Post-operative Plan:  Extubation in OR ? ?Informed Consent: I have reviewed the patients History and Physical, chart, labs and discussed the procedure including the risks, benefits and alternatives for the proposed anesthesia with the patient or authorized representative who has indicated his/her understanding and acceptance.  ? ? ? ?Dental Advisory Given ? ?Plan Discussed with: Anesthesiologist, CRNA and Surgeon ? ?Anesthesia Plan Comments: (Plan for dialysis after surgery per medicine team ? ?Patient consented for risks of anesthesia including but not limited to:  ?- adverse reactions to medications ?- damage to eyes, teeth, lips or other oral mucosa ?- nerve damage due to positioning  ?- sore throat or hoarseness ?- Damage to heart, brain, nerves, lungs, other parts of body or loss of life ? ?Patient voiced understanding.)   ? ? ? ? ? ? ?Anesthesia Quick Evaluation ? ?

## 2021-12-14 NOTE — Progress Notes (Signed)
Pt requesting for muscle relaxer. Hassan Rowan Morrison/NP. Waiting for response.  ?

## 2021-12-14 NOTE — Op Note (Signed)
DATE OF SURGERY: 12/14/2021 ? ?PREOPERATIVE DIAGNOSIS: Right femoral neck fracture ? ?POSTOPERATIVE DIAGNOSIS: Right femoral neck fracture ? ?PROCEDURE: Right hip hemiarthroplasty ? ?SURGEON: Cato Mulligan, MD ? ?EBL: 200 cc ? ?COMPONENTS:  ?[employer] - Accolade C Size 7 Stem ?[employer] - Unitrax 42mm head with +4 offset neck ? ?INDICATIONS: ?[name] is a 56 y.o. female who sustained a displaced femoral neck fracture after a fall. Risks and benefits of hip hemiarthroplasty were explained to the patient and/or family. Please see preoperative consult note for full details regarding her complex medical history. Risks include but are not limited to bleeding, infection, injury to tissues, nerves, vessels, periprosthetic infection, dislocation, limb length discrepancy and risks of anesthesia. The patient and/or family understands these risks, has completed an informed consent and wishes to proceed. ?  ?PROCEDURE:  ?The patient was identified in the preoperative holding area and the operative extremity was marked.  The patient was then transferred to the operating room suite and mobilized from the hospital gurney to the operating room table. General anesthesia was administered without complication. The patient was then transitioned to a lateral position.  All bony prominences were padded per protocol.  An axillary roll was placed.  Careful attention was paid to the contralateral side peroneal nerve, which was free from pressure with use of appropriate padding and blankets. A time-out was performed to confirm the patient's identity and the correct laterality of surgery. The patient was then prepped and draped in the usual sterile fashion. Appropriate pre-operative antibiotics were administered. Tranexamic acid was administered preoperatively.  ?  ?An incision that centered on the posterior tip of the greater trochanter with a posterior curve was made. Dissection was carried down through the subcutaneous tissue.  Careful  attention was made to maintain hemostasis using electrocautery.  Dissection brought [place] to the level of the deep fascia where the gluteus maximus muscle and proximal portion of the IT band were identified.  The proximal region of the IT band was incised in linear fashion and this incision was extended proximally in a curvilinear fashion to split the gluteus maximus muscle parallel to its fibers to minimize bleeding.  This was accomplished using a combination bovie electrocautery as well as blunt dissection.  The trochanteric bursa was then visualized and dissected from anterior to posterior. A blunt homan retractor was placed beneath the abductors. The piriformis tendon and short external rotators were visualized. Bovie electrocautery was used to cut these with the capsule as one L-shaped flap. This was tagged at the corner with #5 Ethibond. At this point, the femoral neck fracture was visualized. An oscillating saw was used to make a new neck cut approximately 42mm above the lesser tuberosity with the use of a neck cut guide. The head was then freed from its remaining soft tissue attachments and measured. The head trial was then inserted into the acetabulum and sized appropriately.  ?  ?We then turned our attention to preparing the femoral canal. First, a box cut was performed utilizing the box osteotome. A canal finder was inserted by hand and sequential broaching was then performed. The calcar planer was inserted onto the broach and used to smooth the calcar appropriately.  A trial stem, neutral neck, and head were inserted into the acetabulum and placed through range of motion. Intraoperative radiographs were taken to assess hardware position and leg lengths. The hip was again dislocated and the femoral trial components were removed. An appropriately sized cement restrictor was placed. The canal was irrigated with  pulse lavage and dried. A cement gun was used to place cement into the femoral canal. Cement was  then pressurized. The actual stem was inserted into the femoral canal and then driven onto the calcar. Position was held until the cement hardened. The trial head was then again inserted on the femoral component and found to be appropriate. Trial was removed and the permanent head/neck was Morse tapered onto the femoral stem and then reduced into the acetabulum.  ?  ?The hip stability and length were reassessed and found to be satisfactory.  The hip was stable in the position of sleep and can reach approximately 90 degrees internal rotation with the hip flexed to 90 degrees.  Dilute Betadine solution was then used to irrigate the wound for 3 minutes.  Posterior margin was then utilized, and the wound was then copiously irrigated with normal saline solution. The tagged sutures of the capsule and piriformis were sewn to the gluteus medius tendon. This adequately closed the hip capsule. The IT band and gluteus maximus fascia were then closed with 0-Vicryl in a running, locked fashion. A mixture of Exparil and Sensoricaine was administered.  Deep fat stitches were placed with 0 Vicryl.  The subdermal layer was closed with 2-0 Vicryl in a buried interrupted fashion. Skin was approximated with staples.  Prevena dressing was applied.  Hinged knee brace locked in extension was replaced given her history of quad tendon repair. The patient was mobilized from the lateral position back to supine on the operating room table and then awakened from general anesthesia without complication. ? ? ?POSTOPERATIVE PLAN: ?The patient will be WBAT on operative extremity. Lovenox x 4 weeks to start on POD#1. IV Abx x 24 hours. PT/OT on POD#1. Posterior hip precautions.  ? ?

## 2021-12-14 NOTE — Transfer of Care (Signed)
Immediate Anesthesia Transfer of Care Note ? ?Patient: [name] ? ?Procedure(s) Performed: ARTHROPLASTY BIPOLAR HIP (HEMIARTHROPLASTY) (Right: Hip) ?APPLICATION OF WOUND VAC (Right: Hip) ? ?Patient Location: PACU ? ?Anesthesia Type:General ? ?Level of Consciousness: drowsy ? ?Airway & Oxygen Therapy: Patient Spontanous Breathing and Patient connected to face mask oxygen ? ?Post-op Assessment: Report given to RN and Post -op Vital signs reviewed and stable ? ?Post vital signs: Reviewed and stable ? ?Last Vitals:  ?Vitals Value Taken Time  ?BP 158/69 12/14/21 1323  ?Temp    ?Pulse 78 12/14/21 1329  ?Resp 20 12/14/21 1329  ?SpO2 100 % 12/14/21 1329  ?Vitals shown include unvalidated device data. ? ?Last Pain:  ?Vitals:  ? 12/14/21 0839  ?TempSrc:   ?PainSc: 9   ?   ? ?  ? ?Complications: No notable events documented. ?

## 2021-12-14 NOTE — Assessment & Plan Note (Addendum)
-   Nephrology following for continuation of dialysis ?

## 2021-12-14 NOTE — Progress Notes (Signed)
Full consult note and discussion with patient to follow later this AM. ? ?Called by ED staff. Imaging reviewed.  ?- Plan for surgery ~1030am on 12/14/21 pending OR availability. ?- NPO until OR ?- Hold anticoagulation ?- Admit to Hospitalist team. ? ?

## 2021-12-14 NOTE — Assessment & Plan Note (Addendum)
-   s/p left hip hemiarthroplasty on 09/29/2021 followed by wound dehiscence with I&D and wound VAC on 11/15/2021 ?- will need ongoing PT ?

## 2021-12-14 NOTE — Assessment & Plan Note (Addendum)
-   denied fall but heard "pop" sensation when ambulating with crutches ?- imaging shows right prox femur fracture ?- admitted for surgical repair with orthopedic surgery ?- s/p R hip hemiarthroplasty on 12/14/2021 ?-Continue with PT/OT after surgery but exercise caution and probably not be as aggressive as usual given pathologic fracture especially until parathyroidectomy able to be performed ?

## 2021-12-14 NOTE — Assessment & Plan Note (Deleted)
Felt a pop in her right femur while ambulating with crutches ?Ortho to do surgical repair on 4/16 ?Pain control and keep n.p.o. ?Further orders per Ortho ?

## 2021-12-14 NOTE — Consult Note (Signed)
ORTHOPAEDIC CONSULTATION ? ?REQUESTING PHYSICIAN: Dwyane Dee, MD ? ?Chief Complaint:   ?R hip pain ? ?History of Present Illness: ?[name] is a 56 y.o. female who was ambulating yesterday and felt a pop in her right hip with severe pain and inability to bear weight afterwards.  Imaging and the ED revealed a right femoral neck fracture without signs of underlying bony lesion. Pain is worse with any sort of movement.   Of note, the patient has a complex medical history.  She has end-stage renal disease on dialysis as well as significant hyperparathyroidism and resultant osteoporosis and is actually scheduled for parathyroidectomy next month.  She also underwent left hip hemiarthroplasty by Dr. Harlow Mares On 09/29/2021.  She Had a wound dehiscence and underwent irrigation and debridement by Dr. Christia Reading on 11/15/2021.  She was discharged on a 10-day course of doxycycline and Keflex as cultures were negative from that surgery additionally she suffered a right quadriceps tendon rupture and underwent right quadriceps tendon repair By Dr. Georgeanna Harrison in [place] on 10/16/2021.  Prior to this injury, patient had difficulty ambulating due to the recent aforementioned surgeries.   ? ? ? ?Past Medical History:  ?Diagnosis Date  ? Anemia   ? vitamin d3 deficiency  ? Anxiety   ? Chronic kidney disease   ? End Stage Renal Disease  ? Diabetes mellitus without complication ([place])   ? GERD (gastroesophageal reflux disease)   ? nothing over last few years  ? High serum parathyroid hormone ([facility])   ? checked through Dialysis  ? History of kidney stones 2000  ? Hypertension   ? Neuromuscular disorder ([place])   ? neuropathy in feet  ? PONV (postoperative nausea and vomiting)   ? severe nausea requiring many doses of post op antiemetics  ? Stroke [facility]) 10/2017  ? thinks she had a series of mini strokes.right leg up to right side of face were numb. no loss of consciousness   ? ?Past Surgical History:  ?Procedure Laterality Date  ? ABDOMINAL HYSTERECTOMY  2007  ? APPLICATION OF WOUND VAC Left 11/15/2021  ? Procedure: APPLICATION OF WOUND VAC;  Surgeon: Thornton Park, MD;  Location: [facility] ORS;  Service: Orthopedics;  Laterality: Left;  Prevena 13cm ?  ? AV FISTULA INSERTION W/ RF MAGNETIC GUIDANCE N/A 12/08/2017  ? Procedure: AV FISTULA INSERTION W/RF MAGNETIC GUIDANCE;  Surgeon: Katha Cabal, MD;  Location: [place] CV LAB;  Service: Cardiovascular;  Laterality: N/A;  ? DIALYSIS/PERMA CATHETER INSERTION Right 10/03/2021  ? Procedure: DIALYSIS/PERMA CATHETER INSERTION;  Surgeon: Katha Cabal, MD;  Location: [place] CV LAB;  Service: Cardiovascular;  Laterality: Right;  ? HIP ARTHROPLASTY Left 09/29/2021  ? Procedure: ARTHROPLASTY BIPOLAR HIP (HEMIARTHROPLASTY);  Surgeon: Lovell Sheehan, MD;  Location: [facility] ORS;  Service: Orthopedics;  Laterality: Left;  ? INCISION AND DRAINAGE HIP Left 11/15/2021  ? Procedure: IRRIGATION AND DEBRIDEMENT LEFT HIP WOUND;  Surgeon: Thornton Park, MD;  Location: [facility] ORS;  Service: Orthopedics;  Laterality: Left;  ? QUADRICEPS TENDON REPAIR Right 10/16/2021  ? Procedure: REPAIR QUADRICEP TENDON;  Surgeon: Georgeanna Harrison, MD;  Location: [place];  Service: Orthopedics;  Laterality: Right;  ? ROTATOR CUFF REPAIR Right 2004  ? SHOULDER CLOSED REDUCTION Right 2004  ? UPPER EXTREMITY VENOGRAPHY Left 02/15/2018  ? Procedure: UPPER EXTREMITY VENOGRAPHY;  Surgeon: Katha Cabal, MD;  Location: [place] CV LAB;  Service: Cardiovascular;  Laterality: Left;  ? ?Social History  ? ?Socioeconomic History  ? Marital status: Married  ?  Spouse name: Aaron Edelman  ? Number of children: 0  ? Years of education: Not on file  ? Highest education level: Some college, no degree  ?Occupational History  ? Occupation: Clinical cytogeneticist  ?Tobacco Use  ? Smoking status: Never  ? Smokeless tobacco: Never  ?Vaping Use  ? Vaping Use: Never used  ?Substance  and Sexual Activity  ? Alcohol use: No  ? Drug use: Never  ? Sexual activity: Yes  ?  Partners: Male  ?Other Topics Concern  ? Not on file  ?Social History Narrative  ? Not on file  ? ?Social Determinants of Health  ? ?Financial Resource Strain: Not on file  ?Food Insecurity: Not on file  ?Transportation Needs: Not on file  ?Physical Activity: Not on file  ?Stress: Not on file  ?Social Connections: Not on file  ? ?Family History  ?Problem Relation Age of Onset  ? Emphysema Mother   ? COPD Mother   ? Cancer Father   ? Cancer Paternal Aunt   ? Diabetes Sister   ? Hypertension Sister   ? Eczema Sister   ? Diabetes Brother   ? Hypertension Brother   ? Cardiomyopathy Brother   ? Alcohol abuse Brother   ? ?Allergies  ?Allergen Reactions  ? Enalapril Hives and Other (See Comments)  ?  Angioedema face.  ? Ivp Dye [Iodinated Contrast Media] Hives  ? Lisinopril Shortness Of Breath  ? ?Prior to Admission medications   ?Medication Sig Start Date End Date Taking? Authorizing Provider  ?acetaminophen (TYLENOL) 325 MG tablet Take 2 tablets (650 mg total) by mouth 3 (three) times daily. 11/07/21  Yes Angiulli, Lavon Paganini, PA-C  ?diclofenac Sodium (VOLTAREN) 1 % GEL Apply 2 g topically 3 (three) times daily. ?Patient taking differently: Apply 2 g topically 3 (three) times daily as needed. 11/07/21  Yes Angiulli, Lavon Paganini, PA-C  ?insulin aspart (NOVOLOG) 100 unit/mL injection Inject 18 Units into the skin as directed. Sliding scale 07/19/19  Yes [provider]  ?losartan (COZAAR) 50 MG tablet Take 50 mg by mouth daily. 12/03/21  Yes [provider]  ?methocarbamol (ROBAXIN) 750 MG tablet Take 1 tablet (750 mg total) by mouth every 8 (eight) hours as needed for muscle spasms. 11/07/21  Yes Angiulli, Lavon Paganini, PA-C  ?polymixin-bacitracin (POLYSPORIN) 500-10000 UNIT/GM OINT ointment Apply 1 application. topically 2 (two) times daily. ?Patient taking differently: Apply 1 application. topically 2 (two) times daily as needed.  11/07/21  Yes Angiulli, Lavon Paganini, PA-C  ?Vitamin D, Ergocalciferol, (DRISDOL) 1.25 MG (50000 UNIT) CAPS capsule Take 1 capsule (50,000 Units total) by mouth every 7 (seven) days. 11/10/21  Yes Angiulli, Lavon Paganini, PA-C  ?ascorbic acid (VITAMIN C) 500 MG tablet Take 1 tablet (500 mg total) by mouth daily. ?Patient not taking: Reported on 12/14/2021 10/13/21   Cathlyn Parsons, PA-C  ?Darbepoetin Alfa (ARANESP) 60 MCG/0.3ML SOSY injection Inject 0.3 mLs (60 mcg total) into the vein every Friday with hemodialysis. ?Patient not taking: Reported on 12/14/2021 11/07/21   Cathlyn Parsons, PA-C  ?HYDROcodone-acetaminophen (NORCO/VICODIN) 5-325 MG tablet Take 1-2 tablets by mouth every 4 (four) hours as needed for moderate pain (pain score 4-6). ?Patient not taking: Reported on 12/14/2021 11/07/21   Angiulli, Lavon Paganini, PA-C  ?insulin glargine (LANTUS) 100 UNIT/ML Solostar Pen Inject 20 Units into the skin daily. ?Patient not taking: Reported on 12/14/2021 11/07/21   Angiulli, Lavon Paganini, PA-C  ?losartan (COZAAR) 25 MG tablet Take 3 tablets (75 mg total) by mouth  daily. ?Patient not taking: Reported on 12/14/2021 11/07/21   Cathlyn Parsons, PA-C  ?multivitamin (RENA-VIT) TABS tablet Take 1 tablet by mouth at bedtime. ?Patient not taking: Reported on 12/14/2021 11/07/21   Cathlyn Parsons, PA-C  ? ?Recent Labs  ?  12/14/21 ?0031  ?WBC 6.9  ?HGB 8.8*  ?HCT 28.1*  ?PLT 223  ?K 5.1  ?CL 95*  ?CO2 26  ?BUN 47*  ?CREATININE 5.40*  ?GLUCOSE 206*  ?CALCIUM 9.5  ?INR 1.1  ? ?DG Chest 1 View ? ?Result Date: 12/14/2021 ?CLINICAL DATA:  Right hip pain. EXAM: CHEST  1 VIEW COMPARISON:  September 28, 2021 FINDINGS: There is stable right-sided venous catheter positioning. The heart size and mediastinal contours are within normal limits. Both lungs are clear. The visualized skeletal structures are unremarkable. IMPRESSION: No active disease. Electronically Signed   By: Virgina Norfolk M.D.   On: 12/14/2021 01:37  ? ?CT HIP RIGHT WO  CONTRAST ? ?Result Date: 12/14/2021 ?CLINICAL DATA:  Initial evaluation for acute trauma, fracture. EXAM: CT OF THE RIGHT HIP WITHOUT CONTRAST TECHNIQUE: Multidetector CT imaging of the right hip was performed according t

## 2021-12-14 NOTE — ED Notes (Signed)
Patient transported to X-ray 

## 2021-12-14 NOTE — Hospital Course (Signed)
[name] is a 56 yo female with PMH CKD, DMII, GERD, ESRD on HD, neuropathy, HTN, anxiety, anemia of chronic disease who presented with right hip pain after ambulating on her crutches and hearing a pop sensation.  She had denied a fall on admission. ?She had left hip hemiarthroplasty on 09/29/2021.  She then had dehiscence of her incision and underwent irrigation and debridement and wound VAC placement on 11/15/2021.  ? ?Imaging studies on admission showed an acute right proximal femur fracture.  She was evaluated by orthopedic surgery with recommendations for surgical repair. ?

## 2021-12-14 NOTE — Assessment & Plan Note (Addendum)
-   noted on CT ?- no chronic opioid use  ?- will start bowel regimen  ?

## 2021-12-14 NOTE — Assessment & Plan Note (Addendum)
-   Baseline hemoglobin approximately 8 to 10 g/dL ?-Hemoglobin 6.7 g/dL on 12/15/2021.  Getting 1 unit PRBC ?

## 2021-12-14 NOTE — ED Provider Notes (Signed)
? ?[facility] ?Provider Note ? ? ? Event Date/Time  ? First MD Initiated Contact with Patient 12/14/21 0020   ?  (approximate) ? ? ?History  ? ?Hip Pain ? ? ?HPI ? ?[name] is a 56 y.o. female   with a history of hypertension, ESRD on hemodialysis, diabetes, osteoporosis who comes the ED complaining of right hip pain which started suddenly when she was trying to walk with her crutches and felt a pop.  It is severe, constant, worse with any movement, nonradiating.  No chest pain or shortness of breath. ? ?She had right knee surgery 2 months ago.  She has a history of left hip replacement but no right hip surgery.  No fall or other trauma. ? ?  ? ? ?Physical Exam  ? ?Triage Vital Signs: ?ED Triage Vitals  ?Enc Vitals Group  ?   BP   ?   Pulse   ?   Resp   ?   Temp   ?   Temp src   ?   SpO2   ?   Weight   ?   Height   ?   Head Circumference   ?   Peak Flow   ?   Pain Score   ?   Pain Loc   ?   Pain Edu?   ?   Excl. in [place]?   ? ? ?Most recent vital signs: ?Vitals:  ? 12/14/21 0141 12/14/21 0200  ?BP: (!) 179/88 (!) 161/79  ?Pulse: 97 100  ?Resp:  18  ?Temp:    ?SpO2: 100% 98%  ? ? ? ?General: Awake, no distress.  ?CV:  Good peripheral perfusion.  Normal DP pulses.  Warm extremities.  Regular rate and rhythm ?Resp:  Normal effort.  Clear to auscultation bilaterally ?Abd:  No distention.  Soft and nontender ?Other:  Pronounced tenderness at the right hip and proximal femur.  No right leg swelling or erythema. ? ? ?ED Results / Procedures / Treatments  ? ?Labs ?(all labs ordered are listed, but only abnormal results are displayed) ?Labs Reviewed  ?BASIC METABOLIC PANEL - Abnormal; Notable for the following components:  ?    Result Value  ? Chloride 95 (*)   ? Glucose, Bld 206 (*)   ? BUN 47 (*)   ? Creatinine, Ser 5.40 (*)   ? GFR, Estimated 9 (*)   ? Anion gap 18 (*)   ? All other components within normal limits  ?CBC WITH DIFFERENTIAL/PLATELET - Abnormal; Notable for the following components:   ? RBC 2.75 (*)   ? Hemoglobin 8.8 (*)   ? HCT 28.1 (*)   ? MCV 102.2 (*)   ? All other components within normal limits  ? ? ? ?EKG ? ? ? ? ?RADIOLOGY ?X-ray right hip viewed and interpreted by me, shows a displaced subcapital right femur fracture.  Radiology report reviewed. ? ? ? ?PROCEDURES: ? ?Critical Care performed: No ? ?Procedures ? ? ?MEDICATIONS ORDERED IN ED: ?Medications  ?HYDROmorphone (DILAUDID) injection 1 mg (1 mg Intravenous Given 12/14/21 0031)  ?morphine (PF) 4 MG/ML injection 4 mg (4 mg Intravenous Given 12/14/21 0157)  ?LORazepam (ATIVAN) injection 1 mg (1 mg Intravenous Given 12/14/21 0158)  ? ? ? ?IMPRESSION / MDM / ASSESSMENT AND PLAN / ED COURSE  ?I reviewed the triage vital signs and the nursing notes. ?             ?               ? ?  Differential diagnosis includes, but is not limited to, hip/femur fracture, muscle spasm/strain, hip dislocation, DVT, electrolyte abnormality ? ? ? ? ?Clinical Course as of 12/14/21 0208  ?Sun Dec 14, 2021  ?0205 Right hip fracture discussed with patient.  Discussed with [facility] Dr. Sharlet Salina who recommends calling [facility] since they are first call for new fractures tonight.  Discussed with Dr. Posey Pronto who will evaluate patient.  Will admit to hospitalist for further management. [PS]  ?  ?Clinical Course User Index ?[PS] Carrie Mew, MD  ? ? ?----------------------------------------- ?2:08 AM on 12/14/2021 ?----------------------------------------- ?Case discussed with hospitalist ? ? ?FINAL CLINICAL IMPRESSION(S) / ED DIAGNOSES  ? ?Final diagnoses:  ?Closed right hip fracture, initial encounter ([place])  ? ? ? ?Rx / DC Orders  ? ?ED Discharge Orders   ? ? None  ? ?  ? ? ? ?Note:  This document was prepared using Dragon voice recognition software and may include unintentional dictation errors. ?  ?Carrie Mew, MD ?12/14/21 0208 ? ?

## 2021-12-14 NOTE — H&P (Signed)
H&P reviewed. No significant changes noted.  

## 2021-12-14 NOTE — Assessment & Plan Note (Signed)
Continue vitamin D.  

## 2021-12-14 NOTE — Assessment & Plan Note (Signed)
Continue losartan with IV hydralazine as needed ?

## 2021-12-14 NOTE — Assessment & Plan Note (Addendum)
-   Continue vitamin D ?- last PTH 3,330 on 11/11/21 ?-Patient is scheduled for parathyroidectomy at [facility] on 01/07/2022.  Patient likely too high risk for parathyroidectomy at [facility].  ENT plans to see patient.  I also discussed case on the phone with Dr. Maudie Mercury, her surgeon at [facility] and he is aware of her hospitalization and plans for continuing with scheduled surgery but date cannot be moved up any sooner ?

## 2021-12-14 NOTE — H&P (Signed)
?History and Physical  ? ? ?Patient: [name] DQQ:[id number] DOB: 1965-09-16 ?DOA: 12/14/2021 ?DOS: the patient was seen and examined on 12/14/2021 ?PCP: Pcp, No  ?Patient coming from: Home ? ?Chief Complaint:  ?Chief Complaint  ?Patient presents with  ? Hip Pain  ? ? ?HPI: [name] is a 56 y.o. female with medical history significant for Type 1 diabetes, ESRD on HD with anemia of CKD, HTN, secondary hyperparathyroidism and osteoporosis with prior left hip fracture  repair in March 2023 and right pubic ramus fracture, currently ambulant with crutches who presents to the ED with right hip pain which started suddenly after she felt a pop in the right hip while trying to ambulate with her crutches.  She denies fall. ?ED course and data review: BP as high as 179/88 with otherwise normal vitals.  Blood work significant for hemoglobin of 8.8 which is her baseline.  Blood sugar of 206 with anion gap of 18 otherwise in keeping for dialysis status.  X-ray hip showing acute fracture of the proximal right femur.  Chest x-ray clear.  EKG pending. ?The ED provider spoke with orthopedist, Dr. Leim Fabry who plans to take patient to the OR later today.  Patient was treated with morphine and Dilaudid given a dose of Ativan.  Hospitalist consulted for admission.  ? ?Review of Systems: As mentioned in the history of present illness. All other systems reviewed and are negative. ?Past Medical History:  ?Diagnosis Date  ? Anemia   ? vitamin d3 deficiency  ? Anxiety   ? Chronic kidney disease   ? End Stage Renal Disease  ? Diabetes mellitus without complication ([place])   ? GERD (gastroesophageal reflux disease)   ? nothing over last few years  ? High serum parathyroid hormone (PTH)   ? checked through Dialysis  ? History of kidney stones 2000  ? Hypertension   ? Neuromuscular disorder ([place])   ? neuropathy in feet  ? PONV (postoperative nausea and vomiting)   ? severe nausea requiring many doses of post op antiemetics  ? Stroke [facility])  10/2017  ? thinks she had a series of mini strokes.right leg up to right side of face were numb. no loss of consciousness  ? ?Past Surgical History:  ?Procedure Laterality Date  ? ABDOMINAL HYSTERECTOMY  2007  ? APPLICATION OF WOUND VAC Left 11/15/2021  ? Procedure: APPLICATION OF WOUND VAC;  Surgeon: Thornton Park, MD;  Location: [facility] ORS;  Service: Orthopedics;  Laterality: Left;  Prevena 13cm ?  ? AV FISTULA INSERTION W/ RF MAGNETIC GUIDANCE N/A 12/08/2017  ? Procedure: AV FISTULA INSERTION W/RF MAGNETIC GUIDANCE;  Surgeon: Katha Cabal, MD;  Location: [place] CV LAB;  Service: Cardiovascular;  Laterality: N/A;  ? DIALYSIS/PERMA CATHETER INSERTION Right 10/03/2021  ? Procedure: DIALYSIS/PERMA CATHETER INSERTION;  Surgeon: Katha Cabal, MD;  Location: [place] CV LAB;  Service: Cardiovascular;  Laterality: Right;  ? HIP ARTHROPLASTY Left 09/29/2021  ? Procedure: ARTHROPLASTY BIPOLAR HIP (HEMIARTHROPLASTY);  Surgeon: Lovell Sheehan, MD;  Location: [facility] ORS;  Service: Orthopedics;  Laterality: Left;  ? INCISION AND DRAINAGE HIP Left 11/15/2021  ? Procedure: IRRIGATION AND DEBRIDEMENT LEFT HIP WOUND;  Surgeon: Thornton Park, MD;  Location: [facility] ORS;  Service: Orthopedics;  Laterality: Left;  ? QUADRICEPS TENDON REPAIR Right 10/16/2021  ? Procedure: REPAIR QUADRICEP TENDON;  Surgeon: Georgeanna Harrison, MD;  Location: [place];  Service: Orthopedics;  Laterality: Right;  ? ROTATOR CUFF REPAIR Right 2004  ? SHOULDER CLOSED REDUCTION Right  2004  ? UPPER EXTREMITY VENOGRAPHY Left 02/15/2018  ? Procedure: UPPER EXTREMITY VENOGRAPHY;  Surgeon: Katha Cabal, MD;  Location: [place] CV LAB;  Service: Cardiovascular;  Laterality: Left;  ? ?Social History:  reports that she has never smoked. She has never used smokeless tobacco. She reports that she does not drink alcohol and does not use drugs. ? ?Allergies  ?Allergen Reactions  ? Enalapril Hives and Other (See Comments)  ?  Angioedema face.  ? Ivp  Dye [Iodinated Contrast Media] Hives  ? Lisinopril Shortness Of Breath  ? Shellfish Allergy Swelling and Other (See Comments)  ?  patient tolerates shellfish by mouth without problem  ? ? ?Family History  ?Problem Relation Age of Onset  ? Emphysema Mother   ? COPD Mother   ? Cancer Father   ? Cancer Paternal Aunt   ? Diabetes Sister   ? Hypertension Sister   ? Eczema Sister   ? Diabetes Brother   ? Hypertension Brother   ? Cardiomyopathy Brother   ? Alcohol abuse Brother   ? ? ?Prior to Admission medications   ?Medication Sig Start Date End Date Taking? Authorizing Provider  ?acetaminophen (TYLENOL) 325 MG tablet Take 2 tablets (650 mg total) by mouth 3 (three) times daily. 11/07/21   Angiulli, Lavon Paganini, PA-C  ?ascorbic acid (VITAMIN C) 500 MG tablet Take 1 tablet (500 mg total) by mouth daily. 10/13/21   Angiulli, Lavon Paganini, PA-C  ?Darbepoetin Alfa (ARANESP) 60 MCG/0.3ML SOSY injection Inject 0.3 mLs (60 mcg total) into the vein every Friday with hemodialysis. 11/07/21   Angiulli, Lavon Paganini, PA-C  ?diclofenac Sodium (VOLTAREN) 1 % GEL Apply 2 g topically 3 (three) times daily. 11/07/21   Angiulli, Lavon Paganini, PA-C  ?HYDROcodone-acetaminophen (NORCO/VICODIN) 5-325 MG tablet Take 1-2 tablets by mouth every 4 (four) hours as needed for moderate pain (pain score 4-6). 11/07/21   Angiulli, Lavon Paganini, PA-C  ?insulin glargine (LANTUS) 100 UNIT/ML Solostar Pen Inject 20 Units into the skin daily. 11/07/21   Angiulli, Lavon Paganini, PA-C  ?losartan (COZAAR) 25 MG tablet Take 3 tablets (75 mg total) by mouth daily. 11/07/21   Angiulli, Lavon Paganini, PA-C  ?methocarbamol (ROBAXIN) 750 MG tablet Take 1 tablet (750 mg total) by mouth every 8 (eight) hours as needed for muscle spasms. 11/07/21   Angiulli, Lavon Paganini, PA-C  ?multivitamin (RENA-VIT) TABS tablet Take 1 tablet by mouth at bedtime. 11/07/21   Angiulli, Lavon Paganini, PA-C  ?polymixin-bacitracin (POLYSPORIN) 500-10000 UNIT/GM OINT ointment Apply 1 application. topically 2 (two) times daily.  11/07/21   Angiulli, Lavon Paganini, PA-C  ?Vitamin D, Ergocalciferol, (DRISDOL) 1.25 MG (50000 UNIT) CAPS capsule Take 1 capsule (50,000 Units total) by mouth every 7 (seven) days. 11/10/21   Angiulli, Lavon Paganini, PA-C  ? ? ?Physical Exam: ?Vitals:  ? 12/14/21 0042 12/14/21 0120 12/14/21 0141 12/14/21 0200  ?BP:   (!) 179/88 (!) 161/79  ?Pulse: 96 99 97 100  ?Resp:    18  ?Temp:      ?TempSrc:      ?SpO2: 100% 96% 100% 98%  ?Weight:      ?Height:      ? ?Physical Exam ?Vitals and nursing note reviewed.  ?Constitutional:   ?   General: She is not in acute distress. ?HENT:  ?   Head: Normocephalic and atraumatic.  ?Cardiovascular:  ?   Rate and Rhythm: Normal rate and regular rhythm.  ?   Heart sounds: Normal heart sounds.  ?Pulmonary:  ?  Effort: Pulmonary effort is normal.  ?   Breath sounds: Normal breath sounds.  ?Abdominal:  ?   Palpations: Abdomen is soft.  ?   Tenderness: There is no abdominal tenderness.  ?Musculoskeletal:  ?   Comments: Right leg in immobilizer and shortened and externally rotated  ?Neurological:  ?   Mental Status: Mental status is at baseline.  ? ? ? ?Data Reviewed: ?Relevant notes from primary care and specialist visits, past discharge summaries as available in EHR, including Care Everywhere. ?Prior diagnostic testing as pertinent to current admission diagnoses ?Updated medications and problem lists for reconciliation ?ED course, including vitals, labs, imaging, treatment and response to treatment ?Triage notes, nursing and pharmacy notes and ED provider's notes ?Notable results as noted in HPI ? ? ?Assessment and Plan: ?* Fracture, proximal femur, right, closed, initial encounter ([place]) ?Felt a pop in her right femur while ambulating with crutches ?Ortho to do surgical repair on 4/16 ?Pain control and keep n.p.o. ?Further orders per Ortho ? ? ?History of fracture of left hip ?History of left hip fracture in March with nonhealing surgical wound and had wound VAC placed on 3/18 ? ?Osteoporosis with  current pathological fracture ?Continue vitamin D ? ?Anemia of chronic kidney failure, stage 5 (HCC) ?Hemoglobin at baseline ? ?Essential hypertension ?Continue losartan with IV hydralazine as needed ? ?Type

## 2021-12-14 NOTE — Progress Notes (Addendum)
?Progress Note ? ? ? ?[name]   ?NLG:[id number]  ?DOB: [birth year]  ?DOA: 12/14/2021     0 ?PCP: Pcp, No ? ?Initial CC: right hip pain ? ?Hospital Course: ?[name] is a 56 yo female with PMH CKD, DMII, GERD, ESRD on HD, neuropathy, HTN, anxiety, anemia of chronic disease who presented with right hip pain after ambulating on her crutches and hearing a pop sensation.  She had denied a fall on admission. ?She had left hip hemiarthroplasty on 09/29/2021.  She then had dehiscence of her incision and underwent irrigation and debridement and wound VAC placement on 11/15/2021.  ? ?Imaging studies on admission showed an acute right proximal femur fracture.  She was evaluated by orthopedic surgery with recommendations for surgical repair. ? ?Interval History:  ?Seen in her room this afternoon after returning from surgery.  Husband present bedside.  She was awake and alert, feeling okay.  She is hopeful for parathyroidectomy while hospitalized to help her be able to focus on rehab and recovery. ?She has been bearing about 50% of weight on her left lower extremity prior to this fall.  We discussed her probable need for going to rehab at discharge which she is still considering. ? ?Assessment and Plan: ?* Fracture, proximal femur, right, closed, initial encounter ([place]) ?- denied fall but heard "pop" sensation when ambulating with crutches ?- imaging shows right prox femur fracture ?- admitted for surgical repair with orthopedic surgery ?- s/p R hip hemiarthroplasty on 12/14/2021 ? ?History of fracture of left hip ?- s/p left hip hemiarthroplasty on 09/29/2021 followed by wound dehiscence with I&D and wound VAC on 11/15/2021 ?- will need ongoing PT post op once again ? ?Constipation ?- noted on CT ?- no chronic opioid use  ?- will start bowel regimen  ? ?Secondary hyperparathyroidism of renal origin [facility]) ?- Continue vitamin D ?- recent work-up with [facility] and scheduled for parathyroidectomy on 01/07/2022 but given pathologic fracture  on admission due to presumed fragile bone, will ask for ENT evaluation to consider inpatient parathyroidectomy as she would benefit and allow her better ability to work with rehab over the next month after this current hip fracture and repair ? ?Osteoporosis with current pathological fracture ?Continue vitamin D ? ?Anemia of chronic disease ?- Baseline hemoglobin approximately 8 to 10 g/dL, currently at baseline ? ?Essential hypertension ?Continue losartan with IV hydralazine as needed ? ?Type 1 diabetes mellitus ([place]) ?- continue SSI ?- resume basal as able ? ?End stage renal disease on dialysis [facility]) ?- Nephrology following for continuation of dialysis ? ? ? ?Old records reviewed in assessment of this patient ? ?Antimicrobials: ? ? ?DVT prophylaxis:  ?SCDs Start: 12/14/21 0225 ? ? ?Code Status:   Code Status: Full Code ? ?Disposition Plan:  Pending PT eval after surgery ?Status is: Inpt ? ?Objective: ?Blood pressure (!) 163/71, pulse 85, temperature 98.1 ?F (36.7 ?C), resp. rate 20, height 5\' 6"  (1.676 m), weight 71.2 kg, SpO2 99 %.  ?Examination:  ?Physical Exam ?Constitutional:   ?   General: She is not in acute distress. ?   Appearance: Normal appearance.  ?HENT:  ?   Head: Normocephalic and atraumatic.  ?   Mouth/Throat:  ?   Mouth: Mucous membranes are moist.  ?Eyes:  ?   Extraocular Movements: Extraocular movements intact.  ?Cardiovascular:  ?   Rate and Rhythm: Normal rate and regular rhythm.  ?Pulmonary:  ?   Effort: Pulmonary effort is normal.  ?   Breath sounds: Normal  breath sounds.  ?Abdominal:  ?   General: Bowel sounds are normal. There is no distension.  ?   Palpations: Abdomen is soft.  ?   Tenderness: There is no abdominal tenderness.  ?Musculoskeletal:  ?   Cervical back: Normal range of motion and neck supple.  ?   Comments: RLE brace in place and wound vac in place with good seal   ?Skin: ?   General: Skin is warm and dry.  ?Neurological:  ?   General: No focal deficit present.  ?   Mental  Status: She is alert.  ?Psychiatric:     ?   Mood and Affect: Mood normal.     ?   Behavior: Behavior normal.  ?  ? ?Consultants:  ?Orthopedic surgery ?ENT ? ?Procedures:  ?Right hip hemiarthroplasty, 12/14/2021 ? ?Data Reviewed: ?Results for orders placed or performed during the hospital encounter of 12/14/21 (from the past 24 hour(s))  ?Surgical pcr screen     Status: None  ? Collection Time: 12/14/21 12:17 AM  ? Specimen: Nasal Mucosa; Nasal Swab  ?Result Value Ref Range  ? MRSA, PCR NEGATIVE NEGATIVE  ? Staphylococcus aureus NEGATIVE NEGATIVE  ?Basic metabolic panel     Status: Abnormal  ? Collection Time: 12/14/21 12:31 AM  ?Result Value Ref Range  ? Sodium 139 135 - 145 mmol/L  ? Potassium 5.1 3.5 - 5.1 mmol/L  ? Chloride 95 (L) 98 - 111 mmol/L  ? CO2 26 22 - 32 mmol/L  ? Glucose, Bld 206 (H) 70 - 99 mg/dL  ? BUN 47 (H) 6 - 20 mg/dL  ? Creatinine, Ser 5.40 (H) 0.44 - 1.00 mg/dL  ? Calcium 9.5 8.9 - 10.3 mg/dL  ? GFR, Estimated 9 (L) >60 mL/min  ? Anion gap 18 (H) 5 - 15  ?CBC with Differential     Status: Abnormal  ? Collection Time: 12/14/21 12:31 AM  ?Result Value Ref Range  ? WBC 6.9 4.0 - 10.5 K/uL  ? RBC 2.75 (L) 3.87 - 5.11 MIL/uL  ? Hemoglobin 8.8 (L) 12.0 - 15.0 g/dL  ? HCT 28.1 (L) 36.0 - 46.0 %  ? MCV 102.2 (H) 80.0 - 100.0 fL  ? MCH 32.0 26.0 - 34.0 pg  ? MCHC 31.3 30.0 - 36.0 g/dL  ? RDW 14.4 11.5 - 15.5 %  ? Platelets 223 150 - 400 K/uL  ? nRBC 0.0 0.0 - 0.2 %  ? Neutrophils Relative % 74 %  ? Neutro Abs 5.1 1.7 - 7.7 K/uL  ? Lymphocytes Relative 12 %  ? Lymphs Abs 0.8 0.7 - 4.0 K/uL  ? Monocytes Relative 8 %  ? Monocytes Absolute 0.6 0.1 - 1.0 K/uL  ? Eosinophils Relative 4 %  ? Eosinophils Absolute 0.3 0.0 - 0.5 K/uL  ? Basophils Relative 1 %  ? Basophils Absolute 0.1 0.0 - 0.1 K/uL  ? Immature Granulocytes 1 %  ? Abs Immature Granulocytes 0.06 0.00 - 0.07 K/uL  ?Protime-INR     Status: None  ? Collection Time: 12/14/21 12:31 AM  ?Result Value Ref Range  ? Prothrombin Time 13.6 11.4 - 15.2  seconds  ? INR 1.1 0.8 - 1.2  ?Glucose, capillary     Status: Abnormal  ? Collection Time: 12/14/21  3:59 AM  ?Result Value Ref Range  ? Glucose-Capillary 200 (H) 70 - 99 mg/dL  ? Comment 1 Notify RN   ?Glucose, capillary     Status: Abnormal  ? Collection Time: 12/14/21  8:00 AM  ?  Result Value Ref Range  ? Glucose-Capillary 199 (H) 70 - 99 mg/dL  ?Glucose, capillary     Status: Abnormal  ? Collection Time: 12/14/21  1:34 PM  ?Result Value Ref Range  ? Glucose-Capillary 205 (H) 70 - 99 mg/dL  ?  ?I have Reviewed nursing notes, Vitals, and Lab results since pt's last encounter. Pertinent lab results : see above ?I have ordered test including BMP, CBC, Mg ?I have reviewed the last note from staff over past 24 hours ?I have discussed pt's care plan and test results with nursing staff, case manager ? ? LOS: 0 days  ? ?Dwyane Dee, MD ?[facility] ?12/14/2021, 3:26 PM ? ?

## 2021-12-15 ENCOUNTER — Encounter: Payer: Self-pay | Admitting: Orthopedic Surgery

## 2021-12-15 DIAGNOSIS — D638 Anemia in other chronic diseases classified elsewhere: Secondary | ICD-10-CM

## 2021-12-15 DIAGNOSIS — K5900 Constipation, unspecified: Secondary | ICD-10-CM | POA: Diagnosis not present

## 2021-12-15 DIAGNOSIS — S72001A Fracture of unspecified part of neck of right femur, initial encounter for closed fracture: Secondary | ICD-10-CM | POA: Diagnosis not present

## 2021-12-15 LAB — GLUCOSE, CAPILLARY
Glucose-Capillary: 134 mg/dL — ABNORMAL HIGH (ref 70–99)
Glucose-Capillary: 148 mg/dL — ABNORMAL HIGH (ref 70–99)
Glucose-Capillary: 153 mg/dL — ABNORMAL HIGH (ref 70–99)
Glucose-Capillary: 177 mg/dL — ABNORMAL HIGH (ref 70–99)
Glucose-Capillary: 179 mg/dL — ABNORMAL HIGH (ref 70–99)
Glucose-Capillary: 214 mg/dL — ABNORMAL HIGH (ref 70–99)
Glucose-Capillary: 225 mg/dL — ABNORMAL HIGH (ref 70–99)

## 2021-12-15 LAB — RENAL FUNCTION PANEL
Albumin: 3.4 g/dL — ABNORMAL LOW (ref 3.5–5.0)
Anion gap: 17 — ABNORMAL HIGH (ref 5–15)
BUN: 67 mg/dL — ABNORMAL HIGH (ref 6–20)
CO2: 22 mmol/L (ref 22–32)
Calcium: 8.5 mg/dL — ABNORMAL LOW (ref 8.9–10.3)
Chloride: 95 mmol/L — ABNORMAL LOW (ref 98–111)
Creatinine, Ser: 6.84 mg/dL — ABNORMAL HIGH (ref 0.44–1.00)
GFR, Estimated: 7 mL/min — ABNORMAL LOW (ref >60)
Glucose, Bld: 191 mg/dL — ABNORMAL HIGH (ref 70–99)
Phosphorus: 8.1 mg/dL — ABNORMAL HIGH (ref 2.5–4.6)
Potassium: 7.1 mmol/L (ref 3.5–5.1)
Sodium: 134 mmol/L — ABNORMAL LOW (ref 135–145)

## 2021-12-15 LAB — CBC WITH DIFFERENTIAL/PLATELET
Abs Immature Granulocytes: 0.06 10*3/uL (ref 0.00–0.07)
Basophils Absolute: 0 10*3/uL (ref 0.0–0.1)
Basophils Relative: 0 %
Eosinophils Absolute: 0 10*3/uL (ref 0.0–0.5)
Eosinophils Relative: 0 %
HCT: 21.3 % — ABNORMAL LOW (ref 36.0–46.0)
Hemoglobin: 6.7 g/dL — ABNORMAL LOW (ref 12.0–15.0)
Immature Granulocytes: 1 %
Lymphocytes Relative: 6 %
Lymphs Abs: 0.5 10*3/uL — ABNORMAL LOW (ref 0.7–4.0)
MCH: 32.1 pg (ref 26.0–34.0)
MCHC: 31.5 g/dL (ref 30.0–36.0)
MCV: 101.9 fL — ABNORMAL HIGH (ref 80.0–100.0)
Monocytes Absolute: 0.7 10*3/uL (ref 0.1–1.0)
Monocytes Relative: 8 %
Neutro Abs: 7.7 10*3/uL (ref 1.7–7.7)
Neutrophils Relative %: 85 %
Platelets: 189 10*3/uL (ref 150–400)
RBC: 2.09 MIL/uL — ABNORMAL LOW (ref 3.87–5.11)
RDW: 14.3 % (ref 11.5–15.5)
WBC: 9.1 10*3/uL (ref 4.0–10.5)
nRBC: 0 % (ref 0.0–0.2)

## 2021-12-15 LAB — HEPATITIS B SURFACE ANTIBODY,QUALITATIVE: Hep B S Ab: NONREACTIVE

## 2021-12-15 LAB — HEPATITIS B SURFACE ANTIGEN: Hepatitis B Surface Ag: NONREACTIVE

## 2021-12-15 LAB — PREPARE RBC (CROSSMATCH)

## 2021-12-15 LAB — MAGNESIUM: Magnesium: 2.3 mg/dL (ref 1.7–2.4)

## 2021-12-15 MED ORDER — SODIUM CHLORIDE 0.9% IV SOLUTION: Freq: Once | INTRAVENOUS | Status: DC

## 2021-12-15 MED ORDER — TRAMADOL HCL 50 MG PO TABS: 50.0000 mg | ORAL_TABLET | Freq: Four times a day (QID) | ORAL | 0 refills | Status: DC | PRN

## 2021-12-15 MED ORDER — HEPARIN SODIUM (PORCINE) 1000 UNIT/ML IJ SOLN
INTRAMUSCULAR | Status: AC
  Filled 2021-12-15: qty 10

## 2021-12-15 MED ORDER — OXYCODONE HCL 5 MG PO TABS: 5.0000 mg | ORAL_TABLET | ORAL | 0 refills | Status: DC | PRN

## 2021-12-15 MED ORDER — ONDANSETRON HCL 4 MG PO TABS: 4.0000 mg | ORAL_TABLET | Freq: Four times a day (QID) | ORAL | 0 refills | Status: DC | PRN

## 2021-12-15 NOTE — Progress Notes (Signed)
PT Cancellation Note ? ?Patient Details ?Name: [name] ?MRN: [id number] ?DOB: [birth year] ? ? ?Cancelled Treatment:    Reason Eval/Treat Not Completed: Other (comment).  Pt still off unit at dialysis.  Will re-attempt PT evaluation at a later date/time. ? ?Leitha Bleak, PT ?12/15/21, 3:52 PM ? ?

## 2021-12-15 NOTE — Progress Notes (Signed)
PT returned to unit - blood transfusion completed in dialysis  ?

## 2021-12-15 NOTE — Progress Notes (Signed)
?Progress Note ? ? ? ?[name]   ?UXL:[id number]  ?DOB: 30-Aug-1966  ?DOA: 12/14/2021     1 ?PCP: Pcp, No ? ?Initial CC: right hip pain ? ?Hospital Course: ?[name] is a 56 yo female with PMH CKD, DMII, GERD, ESRD on HD, neuropathy, HTN, anxiety, anemia of chronic disease who presented with right hip pain after ambulating on her crutches and hearing a pop sensation.  She had denied a fall on admission. ?She had left hip hemiarthroplasty on 09/29/2021.  She then had dehiscence of her incision and underwent irrigation and debridement and wound VAC placement on 11/15/2021.  ? ?Imaging studies on admission showed an acute right proximal femur fracture.  She was evaluated by orthopedic surgery with recommendations for surgical repair. ? ?Interval History:  ?No events overnight.  Seen in her room after returning from dialysis.  Pain was tolerable.  Did not have therapy today due to her anemia.  She received 1 unit of blood during dialysis. ?Updated her regarding my discussions with Dr. Maudie Mercury today as well. ? ?Assessment and Plan: ?* Fracture, proximal femur, right, closed, initial encounter ([place]) ?- denied fall but heard "pop" sensation when ambulating with crutches ?- imaging shows right prox femur fracture ?- admitted for surgical repair with orthopedic surgery ?- s/p R hip hemiarthroplasty on 12/14/2021 ?-Continue with PT/OT after surgery but exercise caution and probably not be as aggressive as usual given pathologic fracture especially until parathyroidectomy able to be performed ? ?History of fracture of left hip ?- s/p left hip hemiarthroplasty on 09/29/2021 followed by wound dehiscence with I&D and wound VAC on 11/15/2021 ?- will need ongoing PT ? ?Constipation ?- noted on CT ?- no chronic opioid use  ?- will start bowel regimen  ? ?Secondary hyperparathyroidism of renal origin [facility]) ?- Continue vitamin D ?- last PTH 3,330 on 11/11/21 ?-Patient is scheduled for parathyroidectomy at [facility] on 01/07/2022.  Patient likely too  high risk for parathyroidectomy at [facility].  ENT plans to see patient.  I also discussed case on the phone with Dr. Maudie Mercury, her surgeon at [facility] and he is aware of her hospitalization and plans for continuing with scheduled surgery but date cannot be moved up any sooner ? ?Osteoporosis with current pathological fracture ?Continue vitamin D ? ?Anemia of chronic disease ?- Baseline hemoglobin approximately 8 to 10 g/dL ?-Hemoglobin 6.7 g/dL on 12/15/2021.  Getting 1 unit PRBC ? ?Essential hypertension ?Continue losartan with IV hydralazine as needed ? ?Type 1 diabetes mellitus ([place]) ?- continue SSI ?- resume basal as able ? ?End stage renal disease on dialysis [facility]) ?- Nephrology following for continuation of dialysis ? ? ? ?Old records reviewed in assessment of this patient ? ?Antimicrobials: ? ? ?DVT prophylaxis:  ?heparin injection 5,000 Units Start: 12/15/21 0600 ?SCDs Start: 12/14/21 1557 ? ? ?Code Status:   Code Status: Full Code ? ?Disposition Plan:  Pending PT eval after surgery ?Status is: Inpt ? ?Objective: ?Blood pressure (!) 152/66, pulse 98, temperature 97.7 ?F (36.5 ?C), temperature source Oral, resp. rate (!) 25, height 5\' 6"  (1.676 m), weight 71.2 kg, SpO2 98 %.  ?Examination:  ?Physical Exam ?Constitutional:   ?   General: She is not in acute distress. ?   Appearance: Normal appearance.  ?HENT:  ?   Head: Normocephalic and atraumatic.  ?   Mouth/Throat:  ?   Mouth: Mucous membranes are moist.  ?Eyes:  ?   Extraocular Movements: Extraocular movements intact.  ?Cardiovascular:  ?   Rate and  Rhythm: Normal rate and regular rhythm.  ?Pulmonary:  ?   Effort: Pulmonary effort is normal.  ?   Breath sounds: Normal breath sounds.  ?Abdominal:  ?   General: Bowel sounds are normal. There is no distension.  ?   Palpations: Abdomen is soft.  ?   Tenderness: There is no abdominal tenderness.  ?Musculoskeletal:  ?   Cervical back: Normal range of motion and neck supple.  ?   Comments: RLE brace in place and wound vac in  place with good seal   ?Skin: ?   General: Skin is warm and dry.  ?Neurological:  ?   General: No focal deficit present.  ?   Mental Status: She is alert.  ?Psychiatric:     ?   Mood and Affect: Mood normal.     ?   Behavior: Behavior normal.  ?  ? ?Consultants:  ?Orthopedic surgery ?ENT ? ?Procedures:  ?Right hip hemiarthroplasty, 12/14/2021 ? ?Data Reviewed: ?Results for orders placed or performed during the hospital encounter of 12/14/21 (from the past 24 hour(s))  ?Glucose, capillary     Status: Abnormal  ? Collection Time: 12/14/21  9:18 PM  ?Result Value Ref Range  ? Glucose-Capillary 235 (H) 70 - 99 mg/dL  ?Glucose, capillary     Status: Abnormal  ? Collection Time: 12/15/21 12:10 AM  ?Result Value Ref Range  ? Glucose-Capillary 225 (H) 70 - 99 mg/dL  ? Comment 1 Notify RN   ?CBC with Differential/Platelet     Status: Abnormal  ? Collection Time: 12/15/21  2:27 AM  ?Result Value Ref Range  ? WBC 9.1 4.0 - 10.5 K/uL  ? RBC 2.09 (L) 3.87 - 5.11 MIL/uL  ? Hemoglobin 6.7 (L) 12.0 - 15.0 g/dL  ? HCT 21.3 (L) 36.0 - 46.0 %  ? MCV 101.9 (H) 80.0 - 100.0 fL  ? MCH 32.1 26.0 - 34.0 pg  ? MCHC 31.5 30.0 - 36.0 g/dL  ? RDW 14.3 11.5 - 15.5 %  ? Platelets 189 150 - 400 K/uL  ? nRBC 0.0 0.0 - 0.2 %  ? Neutrophils Relative % 85 %  ? Neutro Abs 7.7 1.7 - 7.7 K/uL  ? Lymphocytes Relative 6 %  ? Lymphs Abs 0.5 (L) 0.7 - 4.0 K/uL  ? Monocytes Relative 8 %  ? Monocytes Absolute 0.7 0.1 - 1.0 K/uL  ? Eosinophils Relative 0 %  ? Eosinophils Absolute 0.0 0.0 - 0.5 K/uL  ? Basophils Relative 0 %  ? Basophils Absolute 0.0 0.0 - 0.1 K/uL  ? Immature Granulocytes 1 %  ? Abs Immature Granulocytes 0.06 0.00 - 0.07 K/uL  ?Magnesium     Status: None  ? Collection Time: 12/15/21  2:27 AM  ?Result Value Ref Range  ? Magnesium 2.3 1.7 - 2.4 mg/dL  ?Renal function panel     Status: Abnormal  ? Collection Time: 12/15/21  2:27 AM  ?Result Value Ref Range  ? Sodium 134 (L) 135 - 145 mmol/L  ? Potassium 7.1 (HH) 3.5 - 5.1 mmol/L  ? Chloride 95 (L)  98 - 111 mmol/L  ? CO2 22 22 - 32 mmol/L  ? Glucose, Bld 191 (H) 70 - 99 mg/dL  ? BUN 67 (H) 6 - 20 mg/dL  ? Creatinine, Ser 6.84 (H) 0.44 - 1.00 mg/dL  ? Calcium 8.5 (L) 8.9 - 10.3 mg/dL  ? Phosphorus 8.1 (H) 2.5 - 4.6 mg/dL  ? Albumin 3.4 (L) 3.5 - 5.0 g/dL  ? GFR, Estimated  7 (L) >60 mL/min  ? Anion gap 17 (H) 5 - 15  ?Glucose, capillary     Status: Abnormal  ? Collection Time: 12/15/21  4:11 AM  ?Result Value Ref Range  ? Glucose-Capillary 148 (H) 70 - 99 mg/dL  ?Prepare RBC (crossmatch)     Status: None  ? Collection Time: 12/15/21  8:17 AM  ?Result Value Ref Range  ? Order Confirmation    ?  ORDER PROCESSED BY BLOOD BANK ?Performed at [facility], [address], [place], [place] [address] ?  ?Glucose, capillary     Status: Abnormal  ? Collection Time: 12/15/21  8:22 AM  ?Result Value Ref Range  ? Glucose-Capillary 153 (H) 70 - 99 mg/dL  ?Type and screen [facility]     Status: None (Preliminary result)  ? Collection Time: 12/15/21  9:42 AM  ?Result Value Ref Range  ? ABO/RH(D) A POS   ? Antibody Screen NEG   ? Sample Expiration 12/18/2021,2359   ? Unit Number [id number]   ? Blood Component Type RED CELLS,LR   ? Unit division 00   ? Status of Unit ISSUED   ? Transfusion Status OK TO TRANSFUSE   ? Crossmatch Result    ?  Compatible ?Performed at [facility], [address], [place], [place] [address] ?  ?  ?I have Reviewed nursing notes, Vitals, and Lab results since pt's last encounter. Pertinent lab results : see above ?I have ordered test including BMP, CBC, Mg ?I have reviewed the last note from staff over past 24 hours ?I have discussed pt's care plan and test results with nursing staff, case manager ? ? LOS: 1 day  ? ?Dwyane Dee, MD ?[facility] ?12/15/2021, 4:17 PM ? ?

## 2021-12-15 NOTE — Plan of Care (Signed)
  Problem: Education: Goal: Knowledge of General Education information will improve Description Including pain rating scale, medication(s)/side effects and non-pharmacologic comfort measures Outcome: Progressing   Problem: Health Behavior/Discharge Planning: Goal: Ability to manage health-related needs will improve Outcome: Progressing   Problem: Clinical Measurements: Goal: Ability to maintain clinical measurements within normal limits will improve Outcome: Progressing Goal: Will remain free from infection Outcome: Progressing Goal: Diagnostic test results will improve Outcome: Progressing Goal: Respiratory complications will improve Outcome: Progressing Goal: Cardiovascular complication will be avoided Outcome: Progressing   Problem: Activity: Goal: Risk for activity intolerance will decrease Outcome: Progressing   Problem: Nutrition: Goal: Adequate nutrition will be maintained Outcome: Progressing   Problem: Coping: Goal: Level of anxiety will decrease Outcome: Progressing   Problem: Pain Managment: Goal: General experience of comfort will improve Outcome: Progressing   Problem: Skin Integrity: Goal: Risk for impaired skin integrity will decrease Outcome: Progressing   

## 2021-12-15 NOTE — Discharge Instructions (Addendum)
POSTERIOR TOTAL HIP REPLACEMENT POSTOPERATIVE DIRECTIONS ? ?Hip Rehabilitation, Guidelines Following Surgery  ?The results of a hip operation are greatly improved after range of motion and muscle strengthening exercises. Follow all safety measures which are given to protect your hip. If any of these exercises cause increased pain or swelling in your joint, decrease the amount until you are comfortable again. Then slowly increase the exercises. Call your caregiver if you have problems or questions.  ? ?HOME CARE INSTRUCTIONS  ?Remove items at home which could result in a fall. This includes throw rugs or furniture in walking pathways.  ?ICE to the affected hip every three hours for 30 minutes at a time and then as needed for pain and swelling.  Continue to use ice on the hip for pain and swelling from surgery. You may notice swelling that will progress down to the foot and ankle.  This is normal after surgery.  Elevate the leg when you are not up walking on it.   ?Continue to use the breathing machine which will help keep your temperature down.  It is common for your temperature to cycle up and down following surgery, especially at night when you are not up moving around and exerting yourself.  The breathing machine keeps your lungs expanded and your temperature down. ? ?DIET ?You may resume your previous home diet once your are discharged from the hospital. ? ?DRESSING / WOUND CARE / SHOWERING ?Keep the wound VAC in place for 7 days after surgery.  When the wound VAC device begins to sound the alarm, it can be removed and changed to a dry dressing. ?You need to keep your dressing dry after discharge.   ?Change the surgical dressing if needed with Physical Therapy and reapply a dry dressing each time. ? ? ? ?ACTIVITY ?Walk with your walker as instructed. ?Use walker as long as suggested by your caregivers. ?Avoid periods of inactivity such as sitting longer than an hour when not asleep. This helps prevent blood  clots.  ?You may resume a sexual relationship in one month or when given the OK by your doctor.  ?You may return to work once you are cleared by your doctor.  ?Do not drive a car for 6 weeks or until released by you surgeon.  ?Do not drive while taking narcotics. ? ?WEIGHT BEARING ?Weight bearing as tolerated with assist device (walker, cane, etc) as directed, use it as long as suggested by your surgeon or therapist, typically at least 4-6 weeks. ? ?POSTOPERATIVE CONSTIPATION PROTOCOL ?Constipation - defined medically as fewer than three stools per week and severe constipation as less than one stool per week. ? ?One of the most common issues patients have following surgery is constipation.  Even if you have a regular bowel pattern at home, your normal regimen is likely to be disrupted due to multiple reasons following surgery.  Combination of anesthesia, postoperative narcotics, change in appetite and fluid intake all can affect your bowels.  In order to avoid complications following surgery, here are some recommendations in order to help you during your recovery period. ? ?Colace (docusate) - Pick up an over-the-counter form of Colace or another stool softener and take twice a day as long as you are requiring postoperative pain medications.  Take with a full glass of water daily.  If you experience loose stools or diarrhea, hold the colace until you stool forms back up.  If your symptoms do not get better within 1 week or if they get worse,  check with your doctor. ? ?Dulcolax (bisacodyl) - Pick up over-the-counter and take as directed by the product packaging as needed to assist with the movement of your bowels.  Take with a full glass of water.  Use this product as needed if not relieved by Colace only.  ? ?MiraLax (polyethylene glycol) - Pick up over-the-counter to have on hand.  MiraLax is a solution that will increase the amount of water in your bowels to assist with bowel movements.  Take as directed and can  mix with a glass of water, juice, soda, coffee, or tea.  Take if you go more than two days without a movement. ?Do not use MiraLax more than once per day. Call your doctor if you are still constipated or irregular after using this medication for 7 days in a row. ? ?If you continue to have problems with postoperative constipation, please contact the office for further assistance and recommendations.  If you experience "the worst abdominal pain ever" or develop nausea or vomiting, please contact the office immediatly for further recommendations for treatment. ? ?ITCHING ? If you experience itching with your medications, try taking only a single pain pill, or even half a pain pill at a time.  You can also use Benadryl over the counter for itching or also to help with sleep.  ? ?TED HOSE STOCKINGS ?Wear the elastic stockings on both legs for three weeks following surgery during the day but you may remove then at night for sleeping. ? ?MEDICATIONS ?See your medication summary on the ?After Visit Summary? that the nursing staff will review with you prior to discharge.  You may have some home medications which will be placed on hold until you complete the course of blood thinner medication.  It is important for you to complete the blood thinner medication as prescribed by your surgeon.  Continue your approved medications as instructed at time of discharge. ? ?PRECAUTIONS ?If you experience chest pain or shortness of breath - call 911 immediately for transfer to the hospital emergency department.  ?If you develop a fever greater that 101 F, purulent drainage from wound, increased redness or drainage from wound, foul odor from the wound/dressing, or calf pain - CONTACT YOUR SURGEON.   ?                                                ?FOLLOW-UP APPOINTMENTS ?Make sure you keep all of your appointments after your operation with your surgeon and caregivers. You should call the office at the above phone number and make an  appointment for approximately two weeks after the date of your surgery or on the date instructed by your surgeon outlined in the "After Visit Summary". ? ?RANGE OF MOTION AND STRENGTHENING EXERCISES  ?These exercises are designed to help you keep full movement of your hip joint. Follow your caregiver's or physical therapist's instructions. Perform all exercises about fifteen times, three times per day or as directed. Exercise both hips, even if you have had only one joint replacement. These exercises can be done on a training (exercise) mat, on the floor, on a table or on a bed. Use whatever works the best and is most comfortable for you. Use music or television while you are exercising so that the exercises are a pleasant break in your day. This will make your life better with  the exercises acting as a break in routine you can look forward to.  ?Lying on your back, slowly slide your foot toward your buttocks, raising your knee up off the floor. Then slowly slide your foot back down until your leg is straight again.  ?Lying on your back spread your legs as far apart as you can without causing discomfort.  ?Lying on your side, raise your upper leg and foot straight up from the floor as far as is comfortable. Slowly lower the leg and repeat.  ?Lying on your back, tighten up the muscle in the front of your thigh (quadriceps muscles). You can do this by keeping your leg straight and trying to raise your heel off the floor. This helps strengthen the largest muscle supporting your knee.  ?Lying on your back, tighten up the muscles of your buttocks both with the legs straight and with the knee bent at a comfortable angle while keeping your heel on the floor.  ? ? ? ? ?IF YOU ARE TRANSFERRED TO A SKILLED REHAB FACILITY ?If the patient is transferred to a skilled rehab facility following release from the hospital, a list of the current medications will be sent to the facility for the patient to continue.  When discharged  from the skilled rehab facility, please have the facility set up the patient's [place] prior to being released. Also, the skilled facility will be responsible for providing the patient with thei

## 2021-12-15 NOTE — Progress Notes (Signed)
OT Cancellation Note ? ?Patient Details ?Name: [name] ?MRN: [id number] ?DOB: [birth year] ? ? ?Cancelled Treatment:    Reason Eval/Treat Not Completed: Medical issues which prohibited therapy. Order received.  Chart reviewed.  Pt's K+ noted to be elevated to 7.1 and Hgb down-trending to 6.7 this morning.  Per therapy guidelines for elevated potassium and low Hgb, will hold OT at this time and will re-attempt  at a later date/time as medically appropriate. ? ?Dessie Coma, M.S. OTR/L  ?12/15/21, 8:15 AM  ?ascom [phone] ? ?

## 2021-12-15 NOTE — Progress Notes (Signed)
Hemodialysis Post Treatment Note ? ?December 15, 2021 ? ?Access: RIJ Catheter ? ?Scheduled Treatment Hours: 3 hrs. ? ?UF Removed: 1-liter ? ?Next Scheduled Treatment: 12/17/21 ? ?Note: ? ?Pt completes scheduled 3 hr. treatment, per her outpatient schedule. RIJ CVC without signs of infection. She is not wearing bearing secondary to hip fx and immobile, as such no weight taken.  Pt. with HGB<7 transfused during treatment, without adverse reaction. Tolerates 1-liter fluid removal. Pt completed tx in stable condition, transported to assigned room. Primary nurse given report.  ?

## 2021-12-15 NOTE — Anesthesia Postprocedure Evaluation (Signed)
Anesthesia Post Note ? ?Patient: [name] ? ?Procedure(s) Performed: ARTHROPLASTY BIPOLAR HIP (HEMIARTHROPLASTY) (Right: Hip) ?APPLICATION OF WOUND VAC (Right: Hip) ? ?Patient location during evaluation: PACU ?Anesthesia Type: General ?Level of consciousness: awake and alert ?Pain management: pain level controlled ?Vital Signs Assessment: post-procedure vital signs reviewed and stable ?Respiratory status: spontaneous breathing, nonlabored ventilation, respiratory function stable and patient connected to nasal cannula oxygen ?Cardiovascular status: blood pressure returned to baseline and stable ?Postop Assessment: no apparent nausea or vomiting ?Anesthetic complications: no ? ? ?No notable events documented. ? ? ?Last Vitals:  ?Vitals:  ? 12/14/21 1541 12/14/21 1952  ?BP: 135/62 (!) 147/70  ?Pulse: 77 85  ?Resp: 14 16  ?Temp: 36.6 ?C 36.8 ?C  ?SpO2: 92% 93%  ?  ?Last Pain:  ?Vitals:  ? 12/14/21 2040  ?TempSrc:   ?PainSc: 0-No pain  ? ? ?  ?  ?  ?  ?  ?  ? ?Martha Clan ? ? ? ? ?

## 2021-12-15 NOTE — Progress Notes (Signed)
Date and time results received: 12/15/21 4:05 AM ?Test: BMP ?Critical Value: Potassium 7.1 ? ?Name of Provider Notified: Neomia Glass, NP ? ?Orders Received? Or Actions Taken?: Orders Received - See Orders for details ? ?Earleen Reaper, RN ?

## 2021-12-15 NOTE — Consult Note (Signed)
[name], [place] ?[id number] ?01/21/1966 ?Elizabeth Dee, MD ? ?Reason for Consult: secondary hyperparathyroidismt ? ?HPI: 56 y.o. female with history of secondary hyperparathyroidism admitted for pathological right femur fracture.  Patient had requested to be seen to see if possible to perform surgery while patient is currently hospitalized.  Records from [facility] reviewed.  Scheduled for surgery with Dr. Maudie Mercury currently on 5/10.  History of elevated PTH and secondary hyperparathyroidism with PTH up to 6000 at times.  US done at [facility] revealed hypoechoic nodules consistent with all 4 glands being hyperplastic but also some areas intrathyroidal.  Calcium was low yesterday but improved today.  Patient was schedule to have parathyroid glands cryopreserved per notes. ? ?Allergies:  ?Allergies  ?Allergen Reactions  ? Enalapril Hives and Other (See Comments)  ?  Angioedema face.  ? Ivp Dye [Iodinated Contrast Media] Hives  ? Lisinopril Shortness Of Breath  ? ? ?ROS: Review of systems normal other than 12 systems except per HPI. ? ?PMH:  ?Past Medical History:  ?Diagnosis Date  ? Anemia   ? vitamin d3 deficiency  ? Anxiety   ? Chronic kidney disease   ? End Stage Renal Disease  ? Diabetes mellitus without complication ([place])   ? GERD (gastroesophageal reflux disease)   ? nothing over last few years  ? High serum parathyroid hormone (PTH)   ? checked through Dialysis  ? History of kidney stones 2000  ? Hypertension   ? Neuromuscular disorder ([place])   ? neuropathy in feet  ? PONV (postoperative nausea and vomiting)   ? severe nausea requiring many doses of post op antiemetics  ? Stroke [facility]) 10/2017  ? thinks she had a series of mini strokes.right leg up to right side of face were numb. no loss of consciousness  ? ? ?FH:  ?Family History  ?Problem Relation Age of Onset  ? Emphysema Mother   ? COPD Mother   ? Cancer Father   ? Cancer Paternal Aunt   ? Diabetes Sister   ? Hypertension Sister   ? Eczema Sister   ? Diabetes Brother   ?  Hypertension Brother   ? Cardiomyopathy Brother   ? Alcohol abuse Brother   ? ? ?SH:  ?Social History  ? ?Socioeconomic History  ? Marital status: Married  ?  Spouse name: Aaron Edelman  ? Number of children: 0  ? Years of education: Not on file  ? Highest education level: Some college, no degree  ?Occupational History  ? Occupation: Clinical cytogeneticist  ?Tobacco Use  ? Smoking status: Never  ? Smokeless tobacco: Never  ?Vaping Use  ? Vaping Use: Never used  ?Substance and Sexual Activity  ? Alcohol use: No  ? Drug use: Never  ? Sexual activity: Yes  ?  Partners: Male  ?Other Topics Concern  ? Not on file  ?Social History Narrative  ? Not on file  ? ?Social Determinants of Health  ? ?Financial Resource Strain: Not on file  ?Food Insecurity: Not on file  ?Transportation Needs: Not on file  ?Physical Activity: Not on file  ?Stress: Not on file  ?Social Connections: Not on file  ?Intimate Partner Violence: Not on file  ? ? ?PSH:  ?Past Surgical History:  ?Procedure Laterality Date  ? ABDOMINAL HYSTERECTOMY  2007  ? APPLICATION OF WOUND VAC Left 11/15/2021  ? Procedure: APPLICATION OF WOUND VAC;  Surgeon: Thornton Park, MD;  Location: [facility] ORS;  Service: Orthopedics;  Laterality: Left;  Prevena 13cm ?  ? APPLICATION OF  WOUND VAC Right 12/14/2021  ? Procedure: APPLICATION OF WOUND VAC;  Surgeon: Leim Fabry, MD;  Location: [facility] OR[facility];  Service: Orthopedics;  Laterality: Right;  [id number]  ? AV FISTULA INSERTION W/ RF MAGNETIC GUIDANCE N/A 12/08/2017  ? Procedure: AV FISTULA INSERTION W/RF MAGNETIC GUIDANCE;  Surgeon: Katha Cabal, MD;  Location: [place] [facility];  Service: Cardiovascular;  Laterality: N/A;  ? DIALYSIS/PERMA CATHETER INSERTION Right 10/03/2021  ? Procedure: DIALYSIS/PERMA CATHETER INSERTION;  Surgeon: Katha Cabal, MD;  Location: [place] [facility];  Service: Cardiovascular;  Laterality: Right;  ? HIP ARTHROPLASTY Left 09/29/2021  ? Procedure: ARTHROPLASTY BIPOLAR HIP (HEMIARTHROPLASTY);   Surgeon: Lovell Sheehan, MD;  Location: [facility] OR[facility];  Service: Orthopedics;  Laterality: Left;  ? HIP ARTHROPLASTY Right 12/14/2021  ? Procedure: ARTHROPLASTY BIPOLAR HIP (HEMIARTHROPLASTY);  Surgeon: Leim Fabry, MD;  Location: [facility] OR[facility];  Service: Orthopedics;  Laterality: Right;  ? INCISION AND DRAINAGE HIP Left 11/15/2021  ? Procedure: IRRIGATION AND DEBRIDEMENT LEFT HIP WOUND;  Surgeon: Thornton Park, MD;  Location: [facility];  Service: Orthopedics;  Laterality: Left;  ? QUADRICEPS TENDON REPAIR Right 10/16/2021  ? Procedure: REPAIR QUADRICEP TENDON;  Surgeon: Georgeanna Harrison, MD;  Location: [place];  Service: Orthopedics;  Laterality: Right;  ? ROTATOR CUFF REPAIR Right 2004  ? SHOULDER CLOSED REDUCTION Right 2004  ? UPPER EXTREMITY VENOGRAPHY Left 02/15/2018  ? Procedure: UPPER EXTREMITY VENOGRAPHY;  Surgeon: Katha Cabal, MD;  Location: [place] [facility];  Service: Cardiovascular;  Laterality: Left;  ? ? ?A/P: Complex patient with severe secondary hyperparathyroidism with low/borderline calcium yet PTH 6000 at times. ? ?Plan:  Discussed with medicine on 4/17 and patient on 4/18.  Given patient's complexity and most likely significant hypocalcemia following 4 gland exploration and reimplantation, I feel that this patient is going to be best served at a tertiary center that is already set up for several weeks.  Unfortunately Dr. Maudie Mercury cannot move up surgery per report.  Discussed with patient who understands.  Will be happy to provide care in future if needed.  Thank you for the opportunity to assist in this patient's care. ? ? ?[name] ?12/15/2021 ?11:48 AM ? ? ? ?

## 2021-12-15 NOTE — Progress Notes (Signed)
PT Cancellation Note ? ?Patient Details ?Name: [name] ?MRN: [id number] ?DOB: [birth year] ? ? ?Cancelled Treatment:    Reason Eval/Treat Not Completed: Patient not medically ready.  PT consult received.  Chart reviewed.  Pt's K+ noted to be elevated to 7.1 and Hgb down-trending to 6.7 this morning.  Per therapy guidelines for elevated potassium and low Hgb, will hold PT at this time (nurse notified) and will re-attempt PT evaluation at a later date/time as medically appropriate. ? ?Leitha Bleak, PT ?12/15/21, 8:12 AM ? ?

## 2021-12-15 NOTE — Progress Notes (Signed)
?[facility]  ?ROUNDING NOTE  ? ?Subjective:  ? ?[name] is a 56 y.o. female with a past medical history of hyperparathyroidism, anemia, osteoporosis, hypertension, type 2 diabetes, and end-stage renal disease on hemodialysis.  Patient presents to the emergency department complaining of right hip pain while ambulating with crutches.  Patient has been admitted for Closed right hip fracture, initial encounter ([place]) [S72.001A] ?Fracture of proximal end of right femur, closed, initial encounter ([place]) [S72.001A] ? ?Patient is known to our practice and receives outpatient dialysis treatments at [facility] on a MWF schedule, supervised by Dr. Holley Raring.  Patient states she was standing from her wheelchair with crutches and felt a pop in her right hip.  Denies falling to the floor, states she slumped back down into her wheelchair.  Hip x-ray on arrival shows acute fracture of proximal right femur.  Patient recently admitted for left hip fracture and discharged to rehab.  Patient states she was home from rehab 3 to 4 weeks prior to this admission. ? ?We have been consulted to manage dialysis needs during this admission ? ? ?Objective:  ?Vital signs in last 24 hours:  ?Temp:  [97.9 ?F (36.6 ?C)-98.8 ?F (37.1 ?C)] 98.4 ?F (36.9 ?C) (04/17 0749) ?Pulse Rate:  [77-102] 102 (04/17 1215) ?Resp:  [12-25] 17 (04/17 1215) ?BP: (106-182)/(44-91) 110/60 (04/17 1215) ?SpO2:  [92 %-100 %] 96 % (04/17 0749) ? ?Weight change:  ?Filed Weights  ? 12/14/21 0039  ?Weight: 71.2 kg  ? ? ?Intake/Output: ?I/O last 3 completed shifts: ?In: 513.7 [I.V.:313.7; IV CVELFYBOF:751] ?Out: 200 [Blood:200] ?  ?Intake/Output this shift: ? No intake/output data recorded. ? ?Physical Exam: ?General: NAD, resting in bed  ?Head: Normocephalic, atraumatic. Moist oral mucosal membranes  ?Eyes: Anicteric  ?Lungs:  Clear to auscultation, normal effort, room air  ?Heart: Regular rate and rhythm  ?Abdomen:  Soft, nontender, nondistended   ?Extremities: trace peripheral edema.  ?Neurologic: Nonfocal, moving all four extremities  ?Skin: No lesions  ?Access: Right PermCath  ? ? ?Basic Metabolic Panel: ?Recent Labs  ?Lab 12/14/21 ?0031 12/15/21 ?0258  ?NA 139 134*  ?K 5.1 7.1*  ?CL 95* 95*  ?CO2 26 22  ?GLUCOSE 206* 191*  ?BUN 47* 67*  ?CREATININE 5.40* 6.84*  ?CALCIUM 9.5 8.5*  ?MG  --  2.3  ?PHOS  --  8.1*  ? ? ?Liver Function Tests: ?Recent Labs  ?Lab 12/15/21 ?0227  ?ALBUMIN 3.4*  ? ?No results for input(s): LIPASE, AMYLASE in the last 168 hours. ?No results for input(s): AMMONIA in the last 168 hours. ? ?CBC: ?Recent Labs  ?Lab 12/14/21 ?0031 12/15/21 ?5277  ?WBC 6.9 9.1  ?NEUTROABS 5.1 7.7  ?HGB 8.8* 6.7*  ?HCT 28.1* 21.3*  ?MCV 102.2* 101.9*  ?PLT 223 189  ? ? ?Cardiac Enzymes: ?No results for input(s): CKTOTAL, CKMB, CKMBINDEX, TROPONINI in the last 168 hours. ? ?BNP: ?Invalid input(s): POCBNP ? ?CBG: ?Recent Labs  ?Lab 12/14/21 ?1531 12/14/21 ?2118 12/15/21 ?0010 12/15/21 ?0411 12/15/21 ?8242  ?GLUCAP 299* 235* 225* 148* 153*  ? ? ?Microbiology: ?Results for orders placed or performed during the hospital encounter of 12/14/21  ?Surgical pcr screen     Status: None  ? Collection Time: 12/14/21 12:17 AM  ? Specimen: Nasal Mucosa; Nasal Swab  ?Result Value Ref Range Status  ? MRSA, PCR NEGATIVE NEGATIVE Final  ? Staphylococcus aureus NEGATIVE NEGATIVE Final  ?  Comment: (NOTE) ?The Xpert SA Assay (FDA approved for NASAL specimens in patients 19 ?years of age and  older), is one component of a comprehensive ?surveillance program. It is not intended to diagnose infection nor to ?guide or monitor treatment. ?Performed at [facility], [place], ?[place] [address] ?  ? ? ?Coagulation Studies: ?Recent Labs  ?  12/14/21 ?0031  ?LABPROT 13.6  ?INR 1.1  ? ? ?Urinalysis: ?No results for input(s): COLORURINE, LABSPEC, [place], GLUCOSEU, HGBUR, BILIRUBINUR, KETONESUR, PROTEINUR, UROBILINOGEN, NITRITE, LEUKOCYTESUR in the last 72  hours. ? ?Invalid input(s): APPERANCEUR  ? ? ?Imaging: ?DG Chest 1 View ? ?Result Date: 12/14/2021 ?CLINICAL DATA:  Right hip pain. EXAM: CHEST  1 VIEW COMPARISON:  September 28, 2021 FINDINGS: There is stable right-sided venous catheter positioning. The heart size and mediastinal contours are within normal limits. Both lungs are clear. The visualized skeletal structures are unremarkable. IMPRESSION: No active disease. Electronically Signed   By: Virgina Norfolk M.D.   On: 12/14/2021 01:37  ? ?DG Pelvis Portable ? ?Result Date: 12/14/2021 ?CLINICAL DATA:  Status post right hip arthroplasty EXAM: PORTABLE PELVIS 1-2 VIEWS COMPARISON:  Abdomen radiographs done on 10/13/2021 FINDINGS: There is interval right hip arthroplasty. There is previous left hip arthroplasty. No fracture is seen. Skin staples are seen. There are pockets of air in the soft tissues around the right hip. IMPRESSION: Status post right hip arthroplasty. Electronically Signed   By: Elmer Picker M.D.   On: 12/14/2021 14:14  ? ?CT HIP RIGHT WO CONTRAST ? ?Result Date: 12/14/2021 ?CLINICAL DATA:  Initial evaluation for acute trauma, fracture. EXAM: CT OF THE RIGHT HIP WITHOUT CONTRAST TECHNIQUE: Multidetector CT imaging of the right hip was performed according to the standard protocol. Multiplanar CT image reconstructions were also generated. RADIATION DOSE REDUCTION: This exam was performed according to the departmental dose-optimization program which includes automated exposure control, adjustment of the mA and/or kV according to patient size and/or use of iterative reconstruction technique. COMPARISON:  Prior radiograph from earlier the same day. FINDINGS: Bones/Joint/Cartilage Acute transverse fracture seen extending through the right femoral neck with superior subluxation. No significant comminution. Extension through the greater trochanter noted (series 9, image 63). Femoral head remains intact. No subtrochanteric extension. Probable associated  acute nondisplaced fracture with impaction at the acetabular cup (series 4, image 113). Remainder of the acetabulum otherwise grossly intact. Remotely healed fracture of the inferior left pubic ramus. Pubis symphysis is widened up to 1.6 cm. Remainder of the visualized bony pelvis otherwise intact. No discrete or worrisome osseous lesions. Ligaments Suboptimally assessed by CT. Muscles and Tendons Visualized musculature intact. Soft tissues Soft tissue swelling and/or hemorrhage present about the acute femoral neck fracture. Suspected mild contusion within the subcutaneous fat overlying the hip/proximal femur. Moderate to large volume stool within the distal colon, suggesting constipation. Remainder of the visualized bowel unremarkable. Appendix within normal limits. Partially visualized bladder grossly intact. Prominent vascular calcifications noted within the pelvis. No visible adenopathy. Small fat containing right inguinal hernia noted. IMPRESSION: 1. Acute transverse fracture extending through the right femoral neck with extension through the greater trochanter. No significant comminution or subtrochanteric extension. 2. Question subtle associated acute nondisplaced impaction fracture at the acetabular cup. 3. Widening of the pubic symphysis up to 1.6 cm, age indeterminate. 4. Remotely healed fracture of the inferior left pubic ramus. 5. Moderate to large volume stool within the distal colon, suggesting constipation. Electronically Signed   By: Jeannine Boga M.D.   On: 12/14/2021 03:59  ? ?DG HIP PORT UNILAT WITH PELVIS 1V LEFT ? ?Result Date: 12/14/2021 ?CLINICAL DATA:  Right hip replacement.  Intraoperative films. EXAM: DG HIP (WITH OR WITHOUT PELVIS) 1V PORT LEFT COMPARISON:  Right hip radiographs at 12:39 a.m. FINDINGS: Two images are submitted. Two images are submitted. Femoral component is in place. Acetabular component night yet finalized. IMPRESSION: Intraoperative images of right hip arthroplasty  without radiographic evidence for complication. Electronically Signed   By: San Morelle M.D.   On: 12/14/2021 13:20  ? ?DG Hip Unilat W or Wo Pelvis 2-3 Views Right ? ?Result Date: 12/14/2021 ?CLINICAL DA

## 2021-12-15 NOTE — Progress Notes (Addendum)
?Subjective: ?1 Day Post-Op Procedure(s) (LRB): ?ARTHROPLASTY BIPOLAR HIP (HEMIARTHROPLASTY) (Right) ?APPLICATION OF WOUND VAC (Right) ?Patient reports pain as mild.   ?Patient is well, and has had no acute complaints or problems ?Plan is to go Home versus rehab after hospital stay. ?Negative for chest pain and shortness of breath ?Fever: no ?Gastrointestinal: Negative for nausea and vomiting ? ?Objective: ?Vital signs in last 24 hours: ?Temp:  [97.9 ?F (36.6 ?C)-98.8 ?F (37.1 ?C)] 98.3 ?F (36.8 ?C) (04/17 0413) ?Pulse Rate:  [77-99] 84 (04/17 0413) ?Resp:  [12-22] 17 (04/17 0413) ?BP: (135-182)/(62-75) 142/71 (04/17 0413) ?SpO2:  [92 %-100 %] 96 % (04/17 0413) ? ?Intake/Output from previous day: ? ?Intake/Output Summary (Last 24 hours) at 12/15/2021 0716 ?Last data filed at 12/15/2021 0021 ?Gross per 24 hour  ?Intake 513.7 ml  ?Output 200 ml  ?Net 313.7 ml  ?  ?Intake/Output this shift: ?No intake/output data recorded. ? ?Labs: ?Recent Labs  ?  12/14/21 ?0031 12/15/21 ?8527  ?HGB 8.8* 6.7*  ? ?Recent Labs  ?  12/14/21 ?0031 12/15/21 ?7824  ?WBC 6.9 9.1  ?RBC 2.75* 2.09*  ?HCT 28.1* 21.3*  ?PLT 223 189  ? ?Recent Labs  ?  12/14/21 ?0031 12/15/21 ?2353  ?NA 139 134*  ?K 5.1 7.1*  ?CL 95* 95*  ?CO2 26 22  ?BUN 47* 67*  ?CREATININE 5.40* 6.84*  ?GLUCOSE 206* 191*  ?CALCIUM 9.5 8.5*  ? ?Recent Labs  ?  12/14/21 ?0031  ?INR 1.1  ? ? ? ?EXAM ?General - Patient is Alert and Oriented ?Extremity - Neurovascular intact ?Sensation intact distally ?Dorsiflexion/Plantar flexion intact ?Compartment soft ?Dressing/Incision -clean dry and wound VAC in place. ? ?Motor Function - intact, moving foot and toes well on exam.  ? ?Past Medical History:  ?Diagnosis Date  ? Anemia   ? vitamin d3 deficiency  ? Anxiety   ? Chronic kidney disease   ? End Stage Renal Disease  ? Diabetes mellitus without complication ([place])   ? GERD (gastroesophageal reflux disease)   ? nothing over last few years  ? High serum parathyroid hormone (PTH)   ?  checked through Dialysis  ? History of kidney stones 2000  ? Hypertension   ? Neuromuscular disorder ([place])   ? neuropathy in feet  ? PONV (postoperative nausea and vomiting)   ? severe nausea requiring many doses of post op antiemetics  ? Stroke [facility]) 10/2017  ? thinks she had a series of mini strokes.right leg up to right side of face were numb. no loss of consciousness  ? ? ?Assessment/Plan: ?1 Day Post-Op Procedure(s) (LRB): ?ARTHROPLASTY BIPOLAR HIP (HEMIARTHROPLASTY) (Right) ?APPLICATION OF WOUND VAC (Right) ?Principal Problem: ?  Fracture, proximal femur, right, closed, initial encounter ([place]) ?Active Problems: ?  Secondary hyperparathyroidism of renal origin ([place]) ?  End stage renal disease on dialysis [facility]) ?  Type 1 diabetes mellitus ([place]) ?  Essential hypertension ?  Anemia of chronic disease ?  History of fracture of left hip ?  Osteoporosis with current pathological fracture ?  Fracture of proximal end of right femur, closed, initial encounter ([place]) ?  Constipation ? ?Estimated body mass index is 25.34 kg/m? as calculated from the following: ?  Height as of this encounter: 5\' 6"  (1.676 m). ?  Weight as of this encounter: 71.2 kg. ?Advance diet ?Up with therapy ?D/C IV fluids ? ?Discharge planning to home versus rehab. ? ?DVT Prophylaxis -heparin, support stockings, compression stockings. ?Weight-Bearing as tolerated to right leg ? ?Reche Dixon, PA-C ?Orthopaedic  Surgery ?12/15/2021, 7:16 AM ? ?

## 2021-12-15 NOTE — Plan of Care (Signed)
?  Problem: Education: ?Goal: Knowledge of General Education information will improve ?Description: Including pain rating scale, medication(s)/side effects and non-pharmacologic comfort measures ?Outcome: Progressing ?  ?Problem: Health Behavior/Discharge Planning: ?Goal: Ability to manage health-related needs will improve ?Outcome: Progressing ?  ?Problem: Clinical Measurements: ?Goal: Ability to maintain clinical measurements within normal limits will improve ?Outcome: Progressing ?Goal: Will remain free from infection ?Outcome: Progressing ?Goal: Diagnostic test results will improve ?Outcome: Progressing ?Goal: Respiratory complications will improve ?Outcome: Progressing ?Goal: Cardiovascular complication will be avoided ?Outcome: Progressing ?  ?Problem: Activity: ?Goal: Risk for activity intolerance will decrease ?Outcome: Progressing ?  ?Problem: Nutrition: ?Goal: Adequate nutrition will be maintained ?Outcome: Progressing ?  ?Problem: Elimination: ?Goal: Will not experience complications related to bowel motility ?Outcome: Progressing ?Goal: Will not experience complications related to urinary retention ?Outcome: Progressing ?  ?Problem: Pain Managment: ?Goal: General experience of comfort will improve ?Outcome: Progressing ?  ?Problem: Safety: ?Goal: Ability to remain free from injury will improve ?Outcome: Progressing ?  ?Problem: Skin Integrity: ?Goal: Risk for impaired skin integrity will decrease ?Outcome: Progressing ?  ?Problem: Coping: ?Goal: Level of anxiety will decrease ?Outcome: Completed/Met ?  ?

## 2021-12-16 DIAGNOSIS — S72001A Fracture of unspecified part of neck of right femur, initial encounter for closed fracture: Secondary | ICD-10-CM | POA: Diagnosis not present

## 2021-12-16 LAB — RENAL FUNCTION PANEL
Albumin: 3.3 g/dL — ABNORMAL LOW (ref 3.5–5.0)
Anion gap: 15 (ref 5–15)
BUN: 34 mg/dL — ABNORMAL HIGH (ref 6–20)
CO2: 25 mmol/L (ref 22–32)
Calcium: 9 mg/dL (ref 8.9–10.3)
Chloride: 96 mmol/L — ABNORMAL LOW (ref 98–111)
Creatinine, Ser: 4.06 mg/dL — ABNORMAL HIGH (ref 0.44–1.00)
GFR, Estimated: 12 mL/min — ABNORMAL LOW (ref >60)
Glucose, Bld: 193 mg/dL — ABNORMAL HIGH (ref 70–99)
Phosphorus: 7.4 mg/dL — ABNORMAL HIGH (ref 2.5–4.6)
Potassium: 5.1 mmol/L (ref 3.5–5.1)
Sodium: 136 mmol/L (ref 135–145)

## 2021-12-16 LAB — CBC WITH DIFFERENTIAL/PLATELET
Abs Immature Granulocytes: 0.06 10*3/uL (ref 0.00–0.07)
Basophils Absolute: 0.1 10*3/uL (ref 0.0–0.1)
Basophils Relative: 1 %
Eosinophils Absolute: 0.1 10*3/uL (ref 0.0–0.5)
Eosinophils Relative: 2 %
HCT: 22.9 % — ABNORMAL LOW (ref 36.0–46.0)
Hemoglobin: 7.3 g/dL — ABNORMAL LOW (ref 12.0–15.0)
Immature Granulocytes: 1 %
Lymphocytes Relative: 6 %
Lymphs Abs: 0.4 10*3/uL — ABNORMAL LOW (ref 0.7–4.0)
MCH: 31.6 pg (ref 26.0–34.0)
MCHC: 31.9 g/dL (ref 30.0–36.0)
MCV: 99.1 fL (ref 80.0–100.0)
Monocytes Absolute: 0.6 10*3/uL (ref 0.1–1.0)
Monocytes Relative: 9 %
Neutro Abs: 5.6 10*3/uL (ref 1.7–7.7)
Neutrophils Relative %: 81 %
Platelets: 158 10*3/uL (ref 150–400)
RBC: 2.31 MIL/uL — ABNORMAL LOW (ref 3.87–5.11)
RDW: 18.7 % — ABNORMAL HIGH (ref 11.5–15.5)
WBC: 6.9 10*3/uL (ref 4.0–10.5)
nRBC: 0 % (ref 0.0–0.2)

## 2021-12-16 LAB — TYPE AND SCREEN
ABO/RH(D): A POS
Antibody Screen: NEGATIVE
Unit division: 0

## 2021-12-16 LAB — BPAM RBC
Blood Product Expiration Date: 202305172359
ISSUE DATE / TIME: 202304171220
Unit Type and Rh: 6200

## 2021-12-16 LAB — GLUCOSE, CAPILLARY
Glucose-Capillary: 201 mg/dL — ABNORMAL HIGH (ref 70–99)
Glucose-Capillary: 117 mg/dL — ABNORMAL HIGH (ref 70–99)
Glucose-Capillary: 152 mg/dL — ABNORMAL HIGH (ref 70–99)
Glucose-Capillary: 156 mg/dL — ABNORMAL HIGH (ref 70–99)
Glucose-Capillary: 216 mg/dL — ABNORMAL HIGH (ref 70–99)

## 2021-12-16 LAB — MAGNESIUM: Magnesium: 1.9 mg/dL (ref 1.7–2.4)

## 2021-12-16 LAB — HEPATITIS B SURFACE ANTIBODY, QUANTITATIVE: Hep B S AB Quant (Post): <3.1 m[IU]/mL — ABNORMAL LOW (ref >9.9)

## 2021-12-16 MED ORDER — INSULIN ASPART 100 UNIT/ML IJ SOLN
0.0000 [IU] | Freq: Every day | INTRAMUSCULAR | Status: DC
  Administered 2021-12-16: 2 [IU] via SUBCUTANEOUS
  Filled 2021-12-16: qty 1

## 2021-12-16 MED ORDER — INSULIN GLARGINE-YFGN 100 UNIT/ML ~~LOC~~ SOLN
10.0000 [IU] | Freq: Every day | SUBCUTANEOUS | Status: DC
  Administered 2021-12-17 – 2021-12-18 (×2): 10 [IU] via SUBCUTANEOUS
  Filled 2021-12-16 (×3): qty 0.1

## 2021-12-16 MED ORDER — INSULIN ASPART 100 UNIT/ML IJ SOLN
0.0000 [IU] | Freq: Three times a day (TID) | INTRAMUSCULAR | Status: DC
  Administered 2021-12-17: 3 [IU] via SUBCUTANEOUS
  Administered 2021-12-18: 2 [IU] via SUBCUTANEOUS
  Administered 2021-12-18: 1 [IU] via SUBCUTANEOUS
  Filled 2021-12-16 (×3): qty 1

## 2021-12-16 MED ORDER — LOSARTAN POTASSIUM 50 MG PO TABS
50.0000 mg | ORAL_TABLET | Freq: Every day | ORAL | Status: DC
  Administered 2021-12-16 – 2021-12-18 (×3): 50 mg via ORAL
  Filled 2021-12-16 (×3): qty 1

## 2021-12-16 NOTE — [place] FL2 (Signed)
?[place] MEDICAID FL2 LEVEL OF CARE SCREENING TOOL  ?  ? ?IDENTIFICATION  ?Patient Name: ?[name] Birthdate: Aug 31, 1966 Sex: female Admission Date (Current Location): ?12/14/2021  ?[place] and [place] Number: ? [place] ?  Facility and Address:  ?[facility], [address] [place], [place] [address] ?     Provider Number: ?[id number]  ?Attending Physician Name and Address:  ?Fritzi Mandes, MD ? Relative Name and Phone Number:  ?Aaron Edelman Spouse [phone] ?   ?Current Level of Care: ?Hospital Recommended Level of Care: ?[place] Prior Approval Number: ?  ? ?Date Approved/Denied: ?  PASRR Number: ?[id number] A ? ?Discharge Plan: ?SNF ?  ? ?Current Diagnoses: ?Patient Active Problem List  ? Diagnosis Date Noted  ? Osteoporosis with current pathological fracture 12/14/2021  ? Fracture of proximal end of right femur, closed, initial encounter ([place]) 12/14/2021  ? Constipation 12/14/2021  ? History of fracture of left hip 11/15/2021  ? Non-healing surgical wound of left groin 11/15/2021  ? Wound infection 11/15/2021  ? Anemia of chronic disease 11/14/2021  ? ESRD on hemodialysis ([place])   ? Acute on chronic anemia   ? Diabetic peripheral neuropathy ([place])   ? Intertrochanteric fracture of left hip ([place]) 10/17/2021  ? Quadriceps tendon rupture 10/16/2021  ? Fracture, proximal femur, right, closed, initial encounter ([place]) 10/06/2021  ? Femur fracture, left ([place]) 09/28/2021  ? Essential hypertension 09/28/2021  ? Type 1 diabetes mellitus ([place]) 01/03/2019  ? Diabetic neuropathy ([place]) 09/27/2018  ? End stage renal disease on dialysis ([place]) 09/27/2018  ? Secondary hyperparathyroidism of renal origin ([place]) 03/07/2018  ? Stage 5 chronic renal impairment associated with type 2 diabetes mellitus ([place]) 07/26/2017  ? Uncontrolled hypertension 07/26/2017  ? Hyperphosphatemia 09/29/2016  ? Metabolic acidosis 38/25/0539  ? Dyslipidemia 08/08/2015  ? ? ?Orientation RESPIRATION BLADDER Height &  Weight   ?  ?Self, Time, Situation, Place ? Normal Continent Weight: 71.2 kg ?Height:  5\' 6"  (167.6 cm)  ?BEHAVIORAL SYMPTOMS/MOOD NEUROLOGICAL BOWEL NUTRITION STATUS  ?    Continent Diet (dc summary)  ?AMBULATORY STATUS COMMUNICATION OF NEEDS Skin   ?Extensive Assist Verbally Normal, Surgical wounds ?  ?  ?  ?    ?     ?     ? ? ?Personal Care Assistance Level of Assistance  ?Bathing, Feeding, Dressing Bathing Assistance: Limited assistance ?Feeding assistance: Limited assistance ?Dressing Assistance: Maximum assistance ?   ? ?Functional Limitations Info  ?    ?  ?   ? ? ?SPECIAL CARE FACTORS FREQUENCY  ?PT (By licensed PT), OT (By licensed OT) Blood Pressure Frequency: 5 times per month ?  ?PT Frequency: 5 times per month ?OT Frequency: 5 times per month ?  ?  ?  ?   ? ? ?Contractures Contractures Info: Not present  ? ? ?Additional Factors Info  ?Code Status, Allergies Code Status Info: full code ?Allergies Info: Enalapril, Ivp Dye (Iodinated Contrast Media), Lisinopril ?  ?  ?  ?   ? ?Current Medications (12/16/2021):  This is the current hospital active medication list ?Current Facility-Administered Medications  ?Medication Dose Route Frequency Provider Last Rate Last Admin  ? 0.9 %  sodium chloride infusion (Manually program via Guardrails IV Fluids)   Intravenous Once Dwyane Dee, MD      ? acetaminophen (TYLENOL) tablet 1,000 mg  1,000 mg Oral Q8H Leim Fabry, MD   1,000 mg at 12/16/21 1009  ? bisacodyl (DULCOLAX) suppository 10 mg  10 mg Rectal Daily PRN  Leim Fabry, MD      ? Chlorhexidine Gluconate Cloth 2 % PADS 6 each  6 each Topical Q0600 Murlean Iba, MD   6 each at 12/16/21 [phone]  ? docusate sodium (COLACE) capsule 100 mg  100 mg Oral BID Leim Fabry, MD   100 mg at 12/16/21 1009  ? heparin injection 5,000 Units  5,000 Units Subcutaneous Q8H Leim Fabry, MD   5,000 Units at 12/15/21 1618  ? HYDROmorphone (DILAUDID) injection 0.2-0.4 mg  0.2-0.4 mg Intravenous Q4H PRN Leim Fabry, MD      ?  oxyCODONE (Oxy IR/ROXICODONE) immediate release tablet 5-10 mg  5-10 mg Oral Q4H PRN Leim Fabry, MD      ? Or  ? HYDROmorphone (DILAUDID) tablet 1-2 mg  1-2 mg Oral Q4H PRN Leim Fabry, MD      ? oxyCODONE (Oxy IR/ROXICODONE) immediate release tablet 10-15 mg  10-15 mg Oral Q4H PRN Leim Fabry, MD   15 mg at 12/16/21 0022  ? Or  ? HYDROmorphone (DILAUDID) tablet 2-3 mg  2-3 mg Oral Q4H PRN Leim Fabry, MD      ? insulin aspart (novoLOG) injection 0-6 Units  0-6 Units Subcutaneous Q4H Leim Fabry, MD   1 Units at 12/16/21 1251  ? menthol-cetylpyridinium (CEPACOL) lozenge 3 mg  1 lozenge Oral PRN Leim Fabry, MD      ? Or  ? phenol (CHLORASEPTIC) mouth spray 1 spray  1 spray Mouth/Throat PRN Leim Fabry, MD      ? methocarbamol (ROBAXIN) tablet 750 mg  750 mg Oral TID PRN Dwyane Dee, MD   750 mg at 12/16/21 1014  ? metoCLOPramide (REGLAN) tablet 5-10 mg  5-10 mg Oral Q8H PRN Leim Fabry, MD      ? Or  ? metoCLOPramide (REGLAN) injection 5-10 mg  5-10 mg Intravenous Q8H PRN Leim Fabry, MD      ? ondansetron [facility]) tablet 4 mg  4 mg Oral Q6H PRN Leim Fabry, MD   4 mg at 12/15/21 0827  ? Or  ? ondansetron (ZOFRAN) injection 4 mg  4 mg Intravenous Q6H PRN Leim Fabry, MD   4 mg at 12/14/21 2040  ? polyethylene glycol (MIRALAX / GLYCOLAX) packet 17 g  17 g Oral Daily Dwyane Dee, MD   17 g at 12/16/21 1009  ? senna-docusate (Senokot-S) tablet 1 tablet  1 tablet Oral QHS PRN Leim Fabry, MD      ? senna-docusate (Senokot-S) tablet 1 tablet  1 tablet Oral BID Dwyane Dee, MD   1 tablet at 12/16/21 1009  ? traMADol (ULTRAM) tablet 50 mg  50 mg Oral Q6H PRN Leim Fabry, MD      ? tranexamic acid (CYKLOKAPRON) IVPB 1,000 mg  1,000 mg Intravenous Once Leim Fabry, MD      ? ? ? ?Discharge Medications: ?Please see discharge summary for a list of discharge medications. ? ?Relevant Imaging Results: ? ?Relevant Lab Results: ? ? ?Additional Information ?SS# [id number] ? ?Conception Oms, RN ? ? ? ? ?

## 2021-12-16 NOTE — Progress Notes (Signed)
[place] at [facility] ? ? ?PATIENT NAME: [name]   ? ?MR#:  [id number] ? ?DATE OF BIRTH:  1966/04/27 ? ?SUBJECTIVE:  ?patient is postop day three right hip surgery. Complains of pain. Awaiting PT to see her. ? ? ?VITALS:  ?Blood pressure (!) 141/73, pulse 98, temperature 98.7 ?F (37.1 ?C), resp. rate 16, height 5\' 6"  (1.676 m), weight 71.2 kg, SpO2 97 %. ? ?PHYSICAL EXAMINATION:  ? ?GENERAL:  56 y.o.-year-old patient lying in the bed with no acute distress.  ?LUNGS: Normal breath sounds bilaterally, no wheezing, rales, rhonchi.  ?CARDIOVASCULAR: S1, S2 normal. No murmurs, rubs, or gallops.  ?ABDOMEN: Soft, nontender, nondistended. Bowel sounds present.  ?EXTREMITIES: right knee brace, right hip dressing    ?NEUROLOGIC: nonfocal  patient is alert and awake ?SKIN: No obvious rash, lesion, or ulcer.  ? ?LABORATORY PANEL:  ?CBC ?Recent Labs  ?Lab 12/16/21 ?9935  ?WBC 6.9  ?HGB 7.3*  ?HCT 22.9*  ?PLT 158  ? ? ?Chemistries  ?Recent Labs  ?Lab 12/16/21 ?7017  ?NA 136  ?K 5.1  ?CL 96*  ?CO2 25  ?GLUCOSE 193*  ?BUN 34*  ?CREATININE 4.06*  ?CALCIUM 9.0  ?MG 1.9  ? ? ?Assessment and Plan ?[name] is a 56 yo female with PMH CKD, DMII, GERD, ESRD on HD, neuropathy, HTN, anxiety, anemia of chronic disease who presented with right hip pain after ambulating on her crutches and hearing a pop sensation.  She had denied a fall on admission. ?She had left hip hemiarthroplasty on 09/29/2021.  She then had dehiscence of her incision and underwent irrigation and debridement and wound VAC placement on 11/15/2021.  ?  ?Imaging studies on admission showed an acute right proximal femur fracture.  She was evaluated by orthopedic surgery with recommendations for surgical repair. ? ?Fracture proximal femur right closed, pathologic ?-- patient heard a pop sensation when ambulating with crutches ?-- underwent right hip surgery 12/14/21 by Dr. Posey Pronto orthopedic ?-- PRN pain meds ?-- PT to see patient-- recommends  rehab ? ?secondary hyperparathyroidism, renal origin ?-- continue vitamin D ?-- last PTH was 3330 11/11/2021 ?-- patient is scheduled for parathyroidectomy in May at [facility], Dr. Maudie Mercury ? ?osteoporosis with recurrent pathology fracture ?-- continue vitamin D ? ?History of fracture of left hip ?- s/p left hip hemiarthroplasty on 09/29/2021 followed by wound dehiscence with I&D and wound VAC on 11/15/2021 ? ?end-stage renal disease on hemodialysis ?-- dialysis ? ?type I diabetes with renal manifestations ?-- continue sliding scale ?--Insulin Semglee 10 units qday ? ?Procedures: right hip surgery ?Family communication :none today ?Consults : orthopedic, nephrology ?CODE STATUS: full  ?DVT Prophylaxis : heparin ?Level of care: Med-Surg ?Status is: Inpatient ?Remains inpatient appropriate because: post right hip surgery. TOC for discharge planning to rehab ?  ? ?TOTAL TIME TAKING CARE OF THIS PATIENT: 25 minutes.  ?>50% time spent on counselling and coordination of care ? ?Note: This dictation was prepared with Dragon dictation along with smaller phrase technology. Any transcriptional errors that result from this process are unintentional. ? ?Fritzi Mandes M.D  ? ? ?[facility]  ? ?CC: ?Primary care physician; Pcp, No  ?

## 2021-12-16 NOTE — Evaluation (Addendum)
Occupational Therapy Evaluation ?Patient Details ?Name: [name] ?MRN: [id number] ?DOB: [birth year] ?Today's Date: 12/16/2021 ? ? ?History of Present Illness Pt is a 56 y.o. female presenting to hospital 4/16 c/o sudden R hip pain after trying to walk with her crutches and felt a pop (of note, pt also s/p R quad tendon repair surgery 2 months ago).  Imaging showing a displaced subcapital R femur fx.  Per notes pt also s/p L hip fx repair and R pubic ramus fx 08/2021 (both WBAT).  Pt now s/p R hip hemiarthroplasty d/t R femoral neck fx 12/14/21.  Scheduled for parathyroidectomy 01/07/22.  PMH includes L hip replacement 09/29/21, I&D L hip 11/15/21, R quadriceps tendon repair 10/16/21, R RCR 2004, htn, ESRD on HD with anemia of CKD, DM, stroke, neuropathy in feet, and osteoporosis.  ? ?Clinical Impression ?  ?Ms Miers was seen for OT/PT co-evaluation this date. Prior to hospital admission, pt was MOD I for ADLs and mobility using w/c and slideboard. Pt lives with spouse in home c ramped entrance. Pt presents to acute OT demonstrating impaired ADL performance and functional mobility 2/2 decreased activity tolerance and functional strength/ROM/balance deficits. Pt currently requires MAX A don B socks at bed level - assist to maintain post hip pcns. Initial MAX/MINx2 decreasing to MAX x1 for lateral scoot t/f using slideboard. SETUP grooming tasks sitting EOB. Pt would benefit from skilled OT to address noted impairments and functional limitations (see below for any additional details). Upon hospital discharge, recommend STR to maximize pt safety and return to PLOF.  ? ?Recommendations for follow up therapy are one component of a multi-disciplinary discharge planning process, led by the attending physician.  Recommendations may be updated based on patient status, additional functional criteria and insurance authorization.  ? ?Follow Up Recommendations ? Skilled nursing-short term rehab (<3 hours/day)  ?  ?Assistance  Recommended at Discharge Frequent or constant Supervision/Assistance  ?Patient can return home with the following A lot of help with walking and/or transfers;A lot of help with bathing/dressing/bathroom;Help with stairs or ramp for entrance ? ?  ?Functional Status Assessment ? Patient has had a recent decline in their functional status and demonstrates the ability to make significant improvements in function in a reasonable and predictable amount of time.  ?Equipment Recommendations ? Hospital bed  ?  ?Recommendations for Other Services   ? ? ?  ?Precautions / Restrictions Precautions ?Precautions: Fall;Posterior Hip ?Precaution Booklet Issued: Yes (comment) ?Precaution Comments: R knee locked in extension for mobility, allow 90* flexion passively ?Required Braces or Orthoses: Knee Immobilizer - Right ?Knee Immobilizer - Right: On at all times ?Restrictions ?Weight Bearing Restrictions: Yes ?RLE Weight Bearing: Weight bearing as tolerated ?Other Position/Activity Restrictions: Pt reports she has progressed to WBAT per R quad tendon repair rehab protocol (with hinged knee brace locked in extension)  ? ?  ? ?Mobility Bed Mobility ?Overal bed mobility: Needs Assistance ?Bed Mobility: Supine to Sit ?  ?  ?Supine to sit: Mod assist, Max assist, +2 for physical assistance, HOB elevated ?  ?  ?General bed mobility comments: assist for R>L LE and assist for trunk using bed sheet ?  ? ?Transfers ?Overall transfer level: Needs assistance ?Equipment used: Sliding board ?Transfers: Bed to chair/wheelchair/BSC ?  ?  ?  ?  ?  ? Lateral/Scoot Transfers: Mod assist, +2 physical assistance, With slide board ?  ?  ? ?  ?Balance Overall balance assessment: Needs assistance ?Sitting-balance support: No upper extremity supported, Feet supported ?Sitting balance-Leahy Scale:  Good ?  ?  ?  ?  ?  ?  ?  ?  ?  ?  ?  ?  ?  ?  ?  ?  ?   ? ?ADL either performed or assessed with clinical judgement  ? ?ADL Overall ADL's : Needs  assistance/impaired ?  ?  ?  ?  ?  ?  ?  ?  ?  ?  ?  ?  ?  ?  ?  ?  ?  ?  ?  ?General ADL Comments: MAX A don B socks at bed level - assist to maintain post hip pcns. MOD A x2 for ADL t/f. SETUP grooming tasks sitting EOB.  ? ? ? ? ?Pertinent Vitals/Pain Pain Assessment ?Pain Assessment: 0-10 ?Pain Score: 3  ?Pain Location: R hip ?Pain Descriptors / Indicators: Sore ?Pain Intervention(s): Limited activity within patient's tolerance, Repositioned  ? ? ? ?Hand Dominance Right ?  ?Extremity/Trunk Assessment Upper Extremity Assessment ?Upper Extremity Assessment: Generalized weakness ?  ?Lower Extremity Assessment ?Lower Extremity Assessment: Generalized weakness ?RLE Deficits / Details: at least 3/5 AROM ankle DF/PF; hip flexion at least 2/5 ?RLE: Unable to fully assess due to pain ?LLE Deficits / Details: at least 3/5 grossly L LE ?  ?Cervical / Trunk Assessment ?Cervical / Trunk Assessment: Normal ?  ?Communication Communication ?Communication: No difficulties ?  ?Cognition Arousal/Alertness: Awake/alert ?Behavior During Therapy: [facility] for tasks assessed/performed ?Overall Cognitive Status: Within Functional Limits for tasks assessed ?  ?  ?  ?  ?  ?  ?  ?  ?  ?  ?  ?  ?  ?  ?  ?  ?  ?  ?  ?General Comments  R LE hinged knee brace in place (locked in extension) ? ?  ? ?Home Living Family/patient expects to be discharged to:: Private residence ?Living Arrangements: Spouse/significant other ?Available Help at Discharge: Family;Available PRN/intermittently ?Type of Home: House ?Home Access: Ramped entrance ?  ?  ?Home Layout: One level ?  ?  ?Bathroom Shower/Tub: Walk-in shower ?  ?Bathroom Toilet: Standard ?Bathroom Accessibility: No ?  ?Home Equipment: Crutches;Wheelchair - manual;BSC/3in1;Rollator (4 wheels);Other (comment) (padded BSC) ?  ?  ?  ? ?  ?Prior Functioning/Environment Prior Level of Function : Needs assist ?  ?  ?  ?  ?  ?  ?Mobility Comments: Pt reports she has been working on walking at home (pt reports  using walker with therapy 1x caused her a lot of problems but did a lot better with crutches household distances); mostly using slideboard for transfers (can do on own vs with assist depending on how she is feeling) ?ADLs Comments: MIN A to don underwear from family ?  ? ?  ?  ?OT Problem List: Decreased strength;Decreased range of motion;Decreased activity tolerance;Impaired balance (sitting and/or standing);Decreased safety awareness ?  ?   ?OT Treatment/Interventions: Self-care/ADL training;Therapeutic exercise;Energy conservation;DME and/or AE instruction;Therapeutic activities;Balance training;Patient/family education  ?  ?OT Goals(Current goals can be found in the care plan section) Acute Rehab OT Goals ?Patient Stated Goal: to go home ?OT Goal Formulation: With patient ?Time For Goal Achievement: 12/30/21 ?Potential to Achieve Goals: Good ?ADL Goals ?Pt Will Perform Grooming: sitting;with modified independence ?Pt Will Perform Lower Body Dressing: with caregiver independent in assisting;with min assist;sitting/lateral leans;with adaptive equipment (no cues to maintain posterior hip pcns) ?Pt Will Transfer to Toilet: with min assist;with transfer board;bedside commode  ?OT Frequency: Min 2X/week ?  ? ?Co-evaluation PT/OT/SLP Co-Evaluation/Treatment: Yes ?  Reason for Co-Treatment: For patient/therapist safety;To address functional/ADL transfers ?PT goals addressed during session: Mobility/safety with mobility ?OT goals addressed during session: ADL's and self-care ?  ? ?  ?AM-PAC OT "6 Clicks" Daily Activity     ?Outcome Measure Help from another person eating meals?: None ?Help from another person taking care of personal grooming?: A Little ?Help from another person toileting, which includes using toliet, bedpan, or urinal?: A Lot ?Help from another person bathing (including washing, rinsing, drying)?: A Lot ?Help from another person to put on and taking off regular upper body clothing?: A Little ?Help from  another person to put on and taking off regular lower body clothing?: A Lot ?6 Click Score: 16 ?  ?End of Session   ? ?Activity Tolerance: Patient tolerated treatment well ?Patient left: in chair;with call bell/phone within reach;w

## 2021-12-16 NOTE — TOC Progression Note (Signed)
Transition of Care (TOC) - Progression Note  ? ? ?Patient Details  ?Name: [name] ?MRN: [id number] ?Date of Birth: 1965-11-25 ? ?Transition of Care (TOC) CM/SW Contact  ?Conception Oms, RN ?Phone Number: ?12/16/2021, 3:38 PM ? ?Clinical Narrative:    ? ?Patient is agreeable to a bed search for STR ?Bedsearch sent, Fl2 completed, PASSR obtained ? ?  ?  ? ?Expected Discharge Plan and Services ?  ?  ?  ?  ?  ?                ?  ?  ?  ?  ?  ?  ?  ?  ?  ?  ? ? ?Social Determinants of Health (SDOH) Interventions ?  ? ?Readmission Risk Interventions ?   ? View : No data to display.  ?  ?  ?  ? ? ?

## 2021-12-16 NOTE — Progress Notes (Signed)
?Subjective: ?2 Days Post-Op Procedure(s) (LRB): ?ARTHROPLASTY BIPOLAR HIP (HEMIARTHROPLASTY) (Right) ?APPLICATION OF WOUND VAC (Right) ?Patient reports pain as mild to moderate.   ?Patient is well, and has had no acute complaints or problems ?Plan is to go Home versus rehab after hospital stay. ?Negative for chest pain and shortness of breath ?Fever: no ?Gastrointestinal: Negative for nausea and vomiting ? ?Objective: ?Vital signs in last 24 hours: ?Temp:  [97.1 ?F (36.2 ?C)-99.3 ?F (37.4 ?C)] 97.8 ?F (36.6 ?C) (04/18 0454) ?Pulse Rate:  [83-103] 98 (04/18 0333) ?Resp:  [10-33] 16 (04/18 0333) ?BP: (106-152)/(44-91) 111/49 (04/18 0333) ?SpO2:  [95 %-98 %] 95 % (04/18 0333) ? ?Intake/Output from previous day: ? ?Intake/Output Summary (Last 24 hours) at 12/16/2021 0715 ?Last data filed at 12/16/2021 0340 ?Gross per 24 hour  ?Intake 300 ml  ?Output 1005 ml  ?Net -705 ml  ?  ?Intake/Output this shift: ?No intake/output data recorded. ? ?Labs: ?Recent Labs  ?  12/14/21 ?0031 12/15/21 ?0227 12/16/21 ?0981  ?HGB 8.8* 6.7* 7.3*  ? ?Recent Labs  ?  12/15/21 ?0227 12/16/21 ?1914  ?WBC 9.1 6.9  ?RBC 2.09* 2.31*  ?HCT 21.3* 22.9*  ?PLT 189 158  ? ?Recent Labs  ?  12/15/21 ?0227 12/16/21 ?7829  ?NA 134* 136  ?K 7.1* 5.1  ?CL 95* 96*  ?CO2 22 25  ?BUN 67* 34*  ?CREATININE 6.84* 4.06*  ?GLUCOSE 191* 193*  ?CALCIUM 8.5* 9.0  ? ?Recent Labs  ?  12/14/21 ?0031  ?INR 1.1  ? ? ? ?EXAM ?General - Patient is Alert and Oriented ?Extremity - Neurovascular intact ?Sensation intact distally ?Dorsiflexion/Plantar flexion intact ?Compartment soft ?Dressing/Incision -clean dry and wound VAC in place. ? ?Motor Function - intact, moving foot and toes well on exam.  ? ?Past Medical History:  ?Diagnosis Date  ? Anemia   ? vitamin d3 deficiency  ? Anxiety   ? Chronic kidney disease   ? End Stage Renal Disease  ? Diabetes mellitus without complication ([place])   ? GERD (gastroesophageal reflux disease)   ? nothing over last few years  ? High serum  parathyroid hormone (PTH)   ? checked through Dialysis  ? History of kidney stones 2000  ? Hypertension   ? Neuromuscular disorder ([place])   ? neuropathy in feet  ? PONV (postoperative nausea and vomiting)   ? severe nausea requiring many doses of post op antiemetics  ? Stroke [facility]) 10/2017  ? thinks she had a series of mini strokes.right leg up to right side of face were numb. no loss of consciousness  ? ? ?Assessment/Plan: ?2 Days Post-Op Procedure(s) (LRB): ?ARTHROPLASTY BIPOLAR HIP (HEMIARTHROPLASTY) (Right) ?APPLICATION OF WOUND VAC (Right) ?Principal Problem: ?  Fracture, proximal femur, right, closed, initial encounter ([place]) ?Active Problems: ?  Secondary hyperparathyroidism of renal origin ([place]) ?  End stage renal disease on dialysis [facility]) ?  Type 1 diabetes mellitus ([place]) ?  Essential hypertension ?  Anemia of chronic disease ?  History of fracture of left hip ?  Osteoporosis with current pathological fracture ?  Fracture of proximal end of right femur, closed, initial encounter ([place]) ?  Constipation ? ?Estimated body mass index is 25.34 kg/m? as calculated from the following: ?  Height as of this encounter: 5\' 6"  (1.676 m). ?  Weight as of this encounter: 71.2 kg. ? ?Physical therapy and Occupational Therapy. ?Progress diet. ? ?Acute blood loss anemia.  Postsurgical.  Hemoglobin 7.3 after 1 unit of transfused blood. ? ?Discharge planning to home  versus rehab. ?Follow-up with [facility] clinic orthopedics in 2 weeks for staple removal and x-ray of the right hip. ? ?DVT Prophylaxis -heparin, support stockings, compression stockings. ?Weight-Bearing as tolerated to right leg ? ?Reche Dixon, PA-C ?Orthopaedic Surgery ?12/16/2021, 7:15 AM ? ?

## 2021-12-16 NOTE — Progress Notes (Signed)
?[facility]  ?ROUNDING NOTE  ? ?Subjective:  ? ?[name] is a 56 y.o. female with a past medical history of hyperparathyroidism, anemia, osteoporosis, hypertension, type 2 diabetes, and end-stage renal disease on hemodialysis.  Patient presents to the emergency department complaining of right hip pain while ambulating with crutches.  Patient has been admitted for Closed right hip fracture, initial encounter ([place]) [S72.001A] ?Fracture of proximal end of right femur, closed, initial encounter ([place]) [S72.001A] ? ?Patient is known to our practice and receives outpatient dialysis treatments at [facility] on a MWF schedule, supervised by Dr. Holley Raring.   ? ?Patient sitting in chair ?Reports soreness with movement, but not as painful as expected.  ?Discussing her goals to attend rehab ?Denies shortness of breath and nausea ? ?Objective:  ?Vital signs in last 24 hours:  ?Temp:  [97.7 ?F (36.5 ?C)-99.3 ?F (37.4 ?C)] 98.2 ?F (36.8 ?C) (04/18 5329) ?Pulse Rate:  [94-103] 97 (04/18 0834) ?Resp:  [16-29] 16 (04/18 0834) ?BP: (111-152)/(49-69) 121/60 (04/18 0834) ?SpO2:  [92 %-98 %] 92 % (04/18 0834) ? ?Weight change:  ?Filed Weights  ? 12/14/21 0039  ?Weight: 71.2 kg  ? ? ?Intake/Output: ?I/O last 3 completed shifts: ?In: 513.7 [I.V.:13.7; Blood:300; IV Piggyback:200] ?Out: 1005 [Other:1005] ?  ?Intake/Output this shift: ? No intake/output data recorded. ? ?Physical Exam: ?General: NAD, sitting in recliner  ?Head: Normocephalic, atraumatic. Moist oral mucosal membranes  ?Eyes: Anicteric  ?Lungs:  Clear to auscultation, normal effort, room air  ?Heart: Regular rate and rhythm  ?Abdomen:  Soft, nontender, nondistended  ?Extremities: trace peripheral edema.  ?Neurologic: Nonfocal, moving all four extremities  ?Skin: No lesions  ?Access: Right PermCath  ? ? ?Basic Metabolic Panel: ?Recent Labs  ?Lab 12/14/21 ?0031 12/15/21 ?0227 12/16/21 ?9242  ?NA 139 134* 136  ?K 5.1 7.1* 5.1  ?CL 95* 95* 96*  ?CO2 26 22 25    ?GLUCOSE 206* 191* 193*  ?BUN 47* 67* 34*  ?CREATININE 5.40* 6.84* 4.06*  ?CALCIUM 9.5 8.5* 9.0  ?MG  --  2.3 1.9  ?PHOS  --  8.1* 7.4*  ? ? ? ?Liver Function Tests: ?Recent Labs  ?Lab 12/15/21 ?0227 12/16/21 ?6834  ?ALBUMIN 3.4* 3.3*  ? ? ?No results for input(s): LIPASE, AMYLASE in the last 168 hours. ?No results for input(s): AMMONIA in the last 168 hours. ? ?CBC: ?Recent Labs  ?Lab 12/14/21 ?0031 12/15/21 ?0227 12/16/21 ?1962  ?WBC 6.9 9.1 6.9  ?NEUTROABS 5.1 7.7 5.6  ?HGB 8.8* 6.7* 7.3*  ?HCT 28.1* 21.3* 22.9*  ?MCV 102.2* 101.9* 99.1  ?PLT 223 189 158  ? ? ? ?Cardiac Enzymes: ?No results for input(s): CKTOTAL, CKMB, CKMBINDEX, TROPONINI in the last 168 hours. ? ?BNP: ?Invalid input(s): POCBNP ? ?CBG: ?Recent Labs  ?Lab 12/15/21 ?2024 12/15/21 ?2325 12/16/21 ?2297 12/16/21 ?0804 12/16/21 ?1226  ?GLUCAP 214* 134* 201* 117* 152*  ? ? ? ?Microbiology: ?Results for orders placed or performed during the hospital encounter of 12/14/21  ?Surgical pcr screen     Status: None  ? Collection Time: 12/14/21 12:17 AM  ? Specimen: Nasal Mucosa; Nasal Swab  ?Result Value Ref Range Status  ? MRSA, PCR NEGATIVE NEGATIVE Final  ? Staphylococcus aureus NEGATIVE NEGATIVE Final  ?  Comment: (NOTE) ?The Xpert SA Assay (FDA approved for NASAL specimens in patients 7 ?years of age and older), is one component of a comprehensive ?surveillance program. It is not intended to diagnose infection nor to ?guide or monitor treatment. ?Performed at [facility], [phone]  [place], ?[place] [address] ?  ? ? ?Coagulation Studies: ?Recent Labs  ?  12/14/21 ?0031  ?LABPROT 13.6  ?INR 1.1  ? ? ? ?Urinalysis: ?No results for input(s): COLORURINE, LABSPEC, [place], GLUCOSEU, HGBUR, BILIRUBINUR, KETONESUR, PROTEINUR, UROBILINOGEN, NITRITE, LEUKOCYTESUR in the last 72 hours. ? ?Invalid input(s): APPERANCEUR  ? ? ?Imaging: ?DG Pelvis Portable ? ?Result Date: 12/14/2021 ?CLINICAL DATA:  Status post right hip arthroplasty EXAM: PORTABLE  PELVIS 1-2 VIEWS COMPARISON:  Abdomen radiographs done on 10/13/2021 FINDINGS: There is interval right hip arthroplasty. There is previous left hip arthroplasty. No fracture is seen. Skin staples are seen. There are pockets of air in the soft tissues around the right hip. IMPRESSION: Status post right hip arthroplasty. Electronically Signed   By: Elmer Picker M.D.   On: 12/14/2021 14:14   ? ? ?Medications:  ? ? tranexamic acid    ? ? sodium chloride   Intravenous Once  ? acetaminophen  1,000 mg Oral Q8H  ? Chlorhexidine Gluconate Cloth  6 each Topical Q0600  ? docusate sodium  100 mg Oral BID  ? heparin injection (subcutaneous)  5,000 Units Subcutaneous Q8H  ? insulin aspart  0-6 Units Subcutaneous Q4H  ? polyethylene glycol  17 g Oral Daily  ? senna-docusate  1 tablet Oral BID  ? ?bisacodyl, HYDROmorphone (DILAUDID) injection, oxyCODONE **OR** HYDROmorphone, oxyCODONE **OR** HYDROmorphone, menthol-cetylpyridinium **OR** phenol, methocarbamol, metoCLOPramide **OR** metoCLOPramide (REGLAN) injection, ondansetron **OR** ondansetron (ZOFRAN) IV, senna-docusate, traMADol ? ?Assessment/ Plan:  ?[name] is a 55 y.o.  female with a past medical history of hyperparathyroidism, anemia, osteoporosis, hypertension, type 2 diabetes, and end-stage renal disease on hemodialysis.  Patient presents to the emergency department complaining of right hip pain while ambulating with crutches.  Patient has been admitted for Closed right hip fracture, initial encounter ([place]) [S72.001A] ?Fracture of proximal end of right femur, closed, initial encounter ([place]) [S72.001A] ? ?[facility]/MWF/right PermCath ? ?End-stage renal disease on hemodialysis.  Will maintain outpatient schedule if possible.  Dialysis received yesterday with UF goal 1L achieved. Next treatment scheduled for Wednesday.  ? ?2. Anemia of chronic kidney disease ?Lab Results  ?Component Value Date  ? HGB 7.3 (L) 12/16/2021  ? Aranesp weekly  outpatient ?Received 1 unit blood transfusion with dialysis yesterday, Hgb currently 7.3 ? ?3. Secondary Hyperparathyroidism:  ?Lab Results  ?Component Value Date  ? CALCIUM 9.0 12/16/2021  ? CAION 1.16 10/16/2021  ? PHOS 7.4 (H) 12/16/2021  ?Phosphorus remains elevated. We will continue to monitor ? ?4. Diabetes mellitus type II with chronic kidney disease insulin dependent. Home regimen includes Novolog and Lantus. Most recent hemoglobin A1c is 6.2 on 09/29/21.  ? Glucose elevated at times. Primary team to manage SSI ? ?5. Closed right hip fracture. Appreciate orthopedics performing right hip hemiarthroplasty on 12/14/21. ? Pain management and PT/OT ? ? LOS: 2 ?[place] ?4/18/20231:14 PM ?  ?

## 2021-12-16 NOTE — Evaluation (Signed)
Physical Therapy Evaluation Patient Details Name: Elizabeth Rosenau MRN: [id number] DOB: [birth year] Today's Date: 12/16/2021  History of Present Illness  Pt is a 56 y.o. female presenting to hospital 4/16 c/o sudden R hip pain after trying to walk with her crutches and felt a pop (of note, pt also s/p R quad tendon repair surgery 2 months ago).  Imaging showing a displaced subcapital R femur fx.  Per notes pt also s/p L hip fx repair and R pubic ramus fx 08/2021 (both WBAT).  Pt now s/p R hip hemiarthroplasty d/t R femoral neck fx 12/14/21.  Scheduled for parathyroidectomy 01/07/22.  PMH includes L hip replacement 09/29/21, I&D L hip 11/15/21, R quadriceps tendon repair 10/16/21, R RCR 2004, htn, ESRD on HD with anemia of CKD, DM, stroke, neuropathy in feet, and osteoporosis.  Clinical Impression  PT/OT co-evaluation performed.  Prior to hospital admission, pt was performing slide-board transfers (w/c level) and working on walking at home (pt reports using walker with therapy 1x caused her a lot of problems but did a lot better with crutches); mostly using slideboard for transfers (can do on own vs with assist depending on how she is feeling); lives with her husband in 1 level home with ramp to enter (pt's w/c doesn't fit in bathroom so pt uses BSC).  Currently pt is mod to max assist x2 semi-supine to sitting edge of bed using bed sheet; SBA sitting balance; and mod to max assist x1 plus min to CGA x1 for sitting slide-board transfer bed to recliner towards R side.  Pain R hip 6-7/10 at rest beginning of session but improved to 3/10 at rest end of session.  Pt would benefit from skilled PT to address noted impairments and functional limitations (see below for any additional details).  Upon hospital discharge, pt would benefit from SNF.     Recommendations for follow up therapy are one component of a multi-disciplinary discharge planning process, led by the attending physician.  Recommendations may be updated based  on patient status, additional functional criteria and insurance authorization.  Follow Up Recommendations Skilled nursing-short term rehab (<3 hours/day)    Assistance Recommended at Discharge Frequent or constant Supervision/Assistance  Patient can return home with the following  Two people to help with walking and/or transfers;Two people to help with bathing/dressing/bathroom;Assistance with cooking/housework;Assist for transportation;Help with stairs or ramp for entrance    Equipment Recommendations  (pt has slideboard, w/c, and BSC at home already)  Recommendations for Other Services  OT consult    Functional Status Assessment Patient has had a recent decline in their functional status and demonstrates the ability to make significant improvements in function in a reasonable and predictable amount of time.     Precautions / Restrictions Precautions Precautions: Fall;Posterior Hip Precaution Booklet Issued: Yes (comment) Precaution Comments: Per MD note: "Continue with PT/OT after surgery but exercise caution and probably not be as aggressive as usual given pathologic fracture especially until parathyroidectomy able to be performed".  Hinged knee brace locked in extension d/t h/o R quad tendon repair (per pt she can unlock brace for PROM up to 90 degrees but brace must be locked in extension for any functional mobility) Required Braces or Orthoses: Knee Immobilizer - Right Knee Immobilizer - Right: On at all times Restrictions Weight Bearing Restrictions: Yes RLE Weight Bearing: Weight bearing as tolerated Other Position/Activity Restrictions: Pt reports she has progressed to WBAT per R quad tendon repair rehab protocol (with hinged knee brace locked in extension)  Mobility  Bed Mobility Overal bed mobility: Needs Assistance Bed Mobility: Supine to Sit     Supine to sit: Mod assist, Max assist, +2 for physical assistance, HOB elevated     General bed mobility comments:  assist for R>L LE and assist for trunk using bed sheet    Transfers Overall transfer level: Needs assistance Equipment used: Sliding board Transfers: Bed to chair/wheelchair/BSC            Lateral/Scoot Transfers: Mod assist, Max assist, Min assist, Min guard, +2 physical assistance, With slide board General transfer comment: slideboard transfer bed to recliner towards R side with mod to max assist x1 and min to CGA of 2nd (use of bed sheet under pt/over slideboard to allow smoother/easier transfer); vc's for technique; assist for R LE (therapist holding R LE NWB'ing during transfer); vc's for UE/LE placement    Ambulation/Gait               General Gait Details: Deferred ambulation (goals for w/c level at this time)  Stairs            Wheelchair Mobility    Modified Rankin (Stroke Patients Only)       Balance Overall balance assessment: Needs assistance Sitting-balance support: No upper extremity supported, Feet supported Sitting balance-Leahy Scale: Good Sitting balance - Comments: steady sitting reaching within BOS                                     Pertinent Vitals/Pain Pain Assessment Pain Assessment: 0-10 Pain Score: 3  Pain Location: R hip Pain Descriptors / Indicators: Sore Pain Intervention(s): Limited activity within patient's tolerance, Monitored during session, Premedicated before session, Repositioned    Home Living Family/patient expects to be discharged to:: Private residence Living Arrangements: Spouse/significant other Available Help at Discharge: Family;Available PRN/intermittently Type of Home: House Home Access: Ramped entrance       Home Layout: One level Home Equipment: Crutches;Wheelchair - manual;BSC/3in1;Rollator (4 wheels);Other (comment) (slideboard)      Prior Function Prior Level of Function : Needs assist             Mobility Comments: Pt reports she has been working on walking at home (pt  reports using walker with therapy 1x caused her a lot of problems but did a lot better with crutches household distances); mostly using slideboard for transfers (can do on own vs with assist depending on how she is feeling)       Hand Dominance        Extremity/Trunk Assessment   Upper Extremity Assessment Upper Extremity Assessment: Generalized weakness    Lower Extremity Assessment Lower Extremity Assessment: Generalized weakness;RLE deficits/detail;LLE deficits/detail RLE Deficits / Details: at least 3/5 AROM ankle DF/PF; hip flexion at least 2/5 RLE: Unable to fully assess due to pain LLE Deficits / Details: at least 3/5 grossly L LE    Cervical / Trunk Assessment Cervical / Trunk Assessment: Normal  Communication   Communication: No difficulties  Cognition Arousal/Alertness: Awake/alert Behavior During Therapy: WFL for tasks assessed/performed Overall Cognitive Status: Within Functional Limits for tasks assessed                                          General Comments General comments (skin integrity, edema, etc.): R LE hinged knee brace in place (  locked in extension).  Nursing cleared pt for participation in physical therapy.  Pt agreeable to PT session.    Exercises  Transfer training   Assessment/Plan    PT Assessment Patient needs continued PT services  PT Problem List Decreased strength;Decreased activity tolerance;Decreased balance;Decreased mobility;Decreased knowledge of use of DME;Decreased knowledge of precautions;Pain;Decreased skin integrity       PT Treatment Interventions DME instruction;Functional mobility training;Therapeutic activities;Therapeutic exercise;Balance training;Patient/family education;Wheelchair mobility training    PT Goals (Current goals can be found in the Care Plan section)  Acute Rehab PT Goals Patient Stated Goal: to go home PT Goal Formulation: With patient Time For Goal Achievement: 12/30/21 Potential to  Achieve Goals: Fair    Frequency BID     Co-evaluation PT/OT/SLP Co-Evaluation/Treatment: Yes Reason for Co-Treatment: To address functional/ADL transfers;For patient/therapist safety PT goals addressed during session: Mobility/safety with mobility OT goals addressed during session: ADL's and self-care       AM-PAC PT "6 Clicks" Mobility  Outcome Measure Help needed turning from your back to your side while in a flat bed without using bedrails?: A Lot Help needed moving from lying on your back to sitting on the side of a flat bed without using bedrails?: Total Help needed moving to and from a bed to a chair (including a wheelchair)?: Total Help needed standing up from a chair using your arms (e.g., wheelchair or bedside chair)?: Total Help needed to walk in hospital room?: Total Help needed climbing 3-5 steps with a railing? : Total 6 Click Score: 7    End of Session Equipment Utilized During Treatment: Other (comment) (slideboard; hinged knee brace locked in extension) Activity Tolerance: Patient tolerated treatment well Patient left: in chair;with call bell/phone within reach;with chair alarm set Nurse Communication: Mobility status;Precautions;Weight bearing status PT Visit Diagnosis: Other abnormalities of gait and mobility (R26.89);Muscle weakness (generalized) (M62.81);Pain Pain - Right/Left: Right Pain - part of body: Hip    Time: 4098-1191 PT Time Calculation (min) (ACUTE ONLY): 36 min   Charges:   PT Evaluation $PT Eval Low Complexity: 1 Low PT Treatments $Therapeutic Activity: 8-22 mins       Hendricks Limes, PT 12/16/21, 10:16 AM

## 2021-12-16 NOTE — Progress Notes (Signed)
Physical Therapy Treatment ?Patient Details ?Name: [name] ?MRN: [id number] ?DOB: 06-09-1966 ?Today's Date: 12/16/2021 ? ? ?History of Present Illness Pt is a 56 y.o. female presenting to hospital 4/16 c/o sudden R hip pain after trying to walk with her crutches and felt a pop (of note, pt also s/p R quad tendon repair surgery 2 months ago).  Imaging showing a displaced subcapital R femur fx.  Per notes pt also s/p L hip fx repair and R pubic ramus fx 08/2021 (both WBAT).  Pt now s/p R hip hemiarthroplasty d/t R femoral neck fx 12/14/21.  Scheduled for parathyroidectomy 01/07/22.  PMH includes L hip replacement 09/29/21, I&D L hip 11/15/21, R quadriceps tendon repair 10/16/21, R RCR 2004, htn, ESRD on HD with anemia of CKD, DM, stroke, neuropathy in feet, and osteoporosis. ? ?  ?PT Comments  ? ? Pt resting in recliner upon PT arrival; pt reports being tired and ready to get back to bed.  Performed sitting slide-board transfer recliner to bed (towards L side) with max assist x1 (see below for additional details on transfer technique) and mod to max assist sit to semi-supine in bed (2 assist to boost pt up in bed using bed sheet).  Discussed with pt current assist levels with focus on w/c level transfers via slide-board--pt reports she does not have needed assist available at home at this time (needs to be more independent with transfers in order to safely manage at home and get to/from dialysis).  Will continue to focus on w/c level transfers via slide-board method during hospitalization. ?  ?Recommendations for follow up therapy are one component of a multi-disciplinary discharge planning process, led by the attending physician.  Recommendations may be updated based on patient status, additional functional criteria and insurance authorization. ? ?Follow Up Recommendations ? Skilled nursing-short term rehab (<3 hours/day) ?  ?  ?Assistance Recommended at Discharge Frequent or constant Supervision/Assistance  ?Patient can  return home with the following Assistance with cooking/housework;Assist for transportation;Help with stairs or ramp for entrance;A lot of help with walking and/or transfers;A lot of help with bathing/dressing/bathroom ?  ?Equipment Recommendations ? Other (comment) (pt has slideboard, w/c, and BSC at home already)  ?  ?Recommendations for Other Services OT consult ? ? ?  ?Precautions / Restrictions Precautions ?Precautions: Fall;Posterior Hip ?Precaution Booklet Issued: Yes (comment) ?Precaution Comments: Per MD note: "Continue with PT/OT after surgery but exercise caution and probably not be as aggressive as usual given pathologic fracture especially until parathyroidectomy able to be performed".  Hinged knee brace locked in extension d/t h/o R quad tendon repair (per pt she can unlock brace for PROM up to 90 degrees but brace must be locked in extension for any functional mobility) ?Required Braces or Orthoses: Knee Immobilizer - Right ?Knee Immobilizer - Right: On at all times ?Restrictions ?Weight Bearing Restrictions: Yes ?RLE Weight Bearing: Weight bearing as tolerated ?Other Position/Activity Restrictions: Pt reports she has progressed to WBAT per R quad tendon repair rehab protocol (with hinged knee brace locked in extension)  ?  ? ?Mobility ? Bed Mobility ?Overal bed mobility: Needs Assistance ?Bed Mobility: Sit to Supine ?  ?  ?Sit to supine: Mod assist, Max assist, HOB elevated ?  ?General bed mobility comments: assist for B LE's ?  ? ?Transfers ?Overall transfer level: Needs assistance ?Equipment used: Sliding board ?Transfers: Bed to chair/wheelchair/BSC ?  ?  ?  ?  ?  ? Lateral/Scoot Transfers: Max assist, With slide board ?General transfer comment: slideboard  transfer recliner to bed towards L side with max assist x1 (use of bed sheet under pt/over slideboard to allow smoother/easier transfers); vc's for technique; vc's for L LE placement; R foot elevated on pillow/blanket to elevate R LE during  transfer (so therapist did not need to hold R LE) ?  ? ?Ambulation/Gait ?  ?  ?  ?  ?  ?  ?  ?General Gait Details: Deferred ambulation (goals for w/c level at this time) ? ? ?Stairs ?  ?  ?  ?  ?  ? ? ?Wheelchair Mobility ?  ? ?Modified Rankin (Stroke Patients Only) ?  ? ? ?  ?Balance Overall balance assessment: Needs assistance ?Sitting-balance support: No upper extremity supported, Feet supported ?Sitting balance-Leahy Scale: Good ?Sitting balance - Comments: steady sitting reaching within BOS ?  ?  ?  ?  ?  ?  ?  ?  ?  ?  ?  ?  ?  ?  ?  ?  ? ?  ?Cognition Arousal/Alertness: Awake/alert ?Behavior During Therapy: [facility] for tasks assessed/performed ?Overall Cognitive Status: Within Functional Limits for tasks assessed ?  ?  ?  ?  ?  ?  ?  ?  ?  ?  ?  ?  ?  ?  ?  ?  ?  ?  ?  ? ?  ?Exercises   ? ?  ?General Comments General comments (skin integrity, edema, etc.): R LE hinged knee brace in place (locked in extension).  Pt agreeable to PT session. ?  ?  ? ?Pertinent Vitals/Pain Pain Assessment ?Pain Assessment: Faces ?Pain Score: 3  ?Faces Pain Scale: Hurts a little bit ?Pain Location: R hip ?Pain Descriptors / Indicators: Sore ?Pain Intervention(s): Limited activity within patient's tolerance, Monitored during session, Premedicated before session, Repositioned  ? ? ?Home Living   ?  ?  ?  ?  ?  ?  ?  ?  ?  ?   ?  ?Prior Function    ?  ?  ?   ? ?PT Goals (current goals can now be found in the care plan section) Acute Rehab PT Goals ?Patient Stated Goal: to go home ?PT Goal Formulation: With patient ?Time For Goal Achievement: 12/30/21 ?Potential to Achieve Goals: Fair ?Progress towards PT goals: Progressing toward goals ? ?  ?Frequency ? ? ? BID ? ? ? ?  ?PT Plan Current plan remains appropriate  ? ? ?Co-evaluation   ?  ?  ?  ?  ? ?  ?AM-PAC PT "6 Clicks" Mobility   ?Outcome Measure ? Help needed turning from your back to your side while in a flat bed without using bedrails?: A Lot ?Help needed moving from lying on your  back to sitting on the side of a flat bed without using bedrails?: Total ?Help needed moving to and from a bed to a chair (including a wheelchair)?: A Lot ?Help needed standing up from a chair using your arms (e.g., wheelchair or bedside chair)?: Total ?Help needed to walk in hospital room?: Total ?Help needed climbing 3-5 steps with a railing? : Total ?6 Click Score: 8 ? ?  ?End of Session Equipment Utilized During Treatment: Other (comment) (slideboard; hinged knee brace locked in extension) ?Activity Tolerance: Patient tolerated treatment well ?Patient left: in bed;with call bell/phone within reach;with bed alarm set;Other (comment) (B LE's elevated via pillows with heels floating; pillow between B LE's for posterior hip precautions) ?Nurse Communication: Mobility status;Precautions;Weight bearing status ?PT Visit  Diagnosis: Other abnormalities of gait and mobility (R26.89);Muscle weakness (generalized) (M62.81);Pain ?Pain - Right/Left: Right ?Pain - part of body: Hip ?  ? ? ?Time: 1314-1400 ?PT Time Calculation (min) (ACUTE ONLY): 46 min ? ?Charges:  $Therapeutic Activity: 38-52 mins          ?          ?Leitha Bleak, PT ?12/16/21, 2:31 PM ? ? ?

## 2021-12-17 DIAGNOSIS — S72001A Fracture of unspecified part of neck of right femur, initial encounter for closed fracture: Secondary | ICD-10-CM | POA: Diagnosis not present

## 2021-12-17 LAB — GLUCOSE, CAPILLARY
Glucose-Capillary: 120 mg/dL — ABNORMAL HIGH (ref 70–99)
Glucose-Capillary: 116 mg/dL — ABNORMAL HIGH (ref 70–99)
Glucose-Capillary: 253 mg/dL — ABNORMAL HIGH (ref 70–99)
Glucose-Capillary: 236 mg/dL — ABNORMAL HIGH (ref 70–99)
Glucose-Capillary: 193 mg/dL — ABNORMAL HIGH (ref 70–99)

## 2021-12-17 LAB — RENAL FUNCTION PANEL
Albumin: 3.1 g/dL — ABNORMAL LOW (ref 3.5–5.0)
Anion gap: 15 (ref 5–15)
BUN: 52 mg/dL — ABNORMAL HIGH (ref 6–20)
CO2: 23 mmol/L (ref 22–32)
Calcium: 8.9 mg/dL (ref 8.9–10.3)
Chloride: 95 mmol/L — ABNORMAL LOW (ref 98–111)
Creatinine, Ser: 5.72 mg/dL — ABNORMAL HIGH (ref 0.44–1.00)
GFR, Estimated: 8 mL/min — ABNORMAL LOW (ref >60)
Glucose, Bld: 170 mg/dL — ABNORMAL HIGH (ref 70–99)
Phosphorus: 7.8 mg/dL — ABNORMAL HIGH (ref 2.5–4.6)
Potassium: 5.8 mmol/L — ABNORMAL HIGH (ref 3.5–5.1)
Sodium: 133 mmol/L — ABNORMAL LOW (ref 135–145)

## 2021-12-17 LAB — SURGICAL PATHOLOGY

## 2021-12-17 NOTE — Plan of Care (Signed)

## 2021-12-17 NOTE — TOC Progression Note (Signed)
Transition of Care (TOC) - Progression Note  ? ? ?Patient Details  ?Name: [name] ?MRN: [id number] ?Date of Birth: Jan 05, 1966 ? ?Transition of Care (TOC) CM/SW Contact  ?Conception Oms, RN ?Phone Number: ?12/17/2021, 1:28 PM ? ?Clinical Narrative:    ?Reviewed the bed offers and Medicare star rating with the patient She chose [facility] in [place] ?I notified Tammy at [facility], Her chair time for dilaysis is MWF at [address], [place] ? ? ?Expected Discharge Plan: [place] ?Barriers to Discharge: Continued Medical Work up, SNF Pending bed offer ? ?Expected Discharge Plan and Services ?Expected Discharge Plan: [place] ?  ?  ?  ?  ?                ?  ?  ?  ?  ?  ?  ?  ?  ?  ?  ? ? ?Social Determinants of Health (SDOH) Interventions ?  ? ?Readmission Risk Interventions ?   ? View : No data to display.  ?  ?  ?  ? ? ?

## 2021-12-17 NOTE — Progress Notes (Signed)
Hemodialysis Post Treatment Note: ? ?Tx date:12/17/2021 ?Tx time: 3 hours ?Access:right CVC ?UF Removed: 1.5L ? ?Note: ? ?HD completed, tolerated well. No complications, Patient asymptomatic ? ? ? ? ? ? ? ?  ?

## 2021-12-17 NOTE — Progress Notes (Signed)
?[facility]  ?ROUNDING NOTE  ? ?Subjective:  ? ?[name] is a 56 y.o. female with a past medical history of hyperparathyroidism, anemia, osteoporosis, hypertension, type 2 diabetes, and end-stage renal disease on hemodialysis.  Patient presents to the emergency department complaining of right hip pain while ambulating with crutches.  Patient has been admitted for Closed right hip fracture, initial encounter ([place]) [S72.001A] ?Fracture of proximal end of right femur, closed, initial encounter ([place]) [S72.001A] ? ?Patient is known to our practice and receives outpatient dialysis treatments at [facility] on a MWF schedule, supervised by Dr. Holley Raring.   ? ?Patient seen and evaluated during dialysis ?  ?HEMODIALYSIS FLOWSHEET: ? ?Blood Flow Rate (mL/min): 400 mL/min ?Arterial Pressure (mmHg): -150 mmHg ?Venous Pressure (mmHg): 140 mmHg ?Transmembrane Pressure (mmHg): 60 mmHg ?Ultrafiltration Rate (mL/min): 670 mL/min ?Dialysate Flow Rate (mL/min): 600 ml/min ?Conductivity: Machine : 13.7 ?Conductivity: Machine : 13.7 ?Dialysis Fluid Bolus: Normal Saline ?Bolus Amount (mL): 250 mL ? ?No complaints at this time ? ?Objective:  ?Vital signs in last 24 hours:  ?Temp:  [93.1 ?F (33.9 ?C)-99.4 ?F (37.4 ?C)] 93.1 ?F (33.9 ?C) (04/19 0930) ?Pulse Rate:  [82-101] 95 (04/19 1100) ?Resp:  [16-25] 18 (04/19 1100) ?BP: (122-152)/(60-85) 133/65 (04/19 1100) ?SpO2:  [94 %-100 %] 100 % (04/19 1045) ?Weight:  [77.7 kg] 77.7 kg (04/19 0908) ? ?Weight change:  ?Filed Weights  ? 12/14/21 0039 12/17/21 0908  ?Weight: 71.2 kg 77.7 kg  ? ? ?Intake/Output: ?I/O last 3 completed shifts: ?In: 330 [P.O.:330] ?Out: 0  ?  ?Intake/Output this shift: ? No intake/output data recorded. ? ?Physical Exam: ?General: NAD, resting in bed  ?Head: Normocephalic, atraumatic. Moist oral mucosal membranes  ?Eyes: Anicteric  ?Lungs:  Clear to auscultation, normal effort, room air  ?Heart: Regular rate and rhythm  ?Abdomen:  Soft, nontender,  nondistended  ?Extremities: trace peripheral edema.  ?Neurologic: Nonfocal, moving all four extremities  ?Skin: No lesions  ?Access: Right PermCath  ? ? ?Basic Metabolic Panel: ?Recent Labs  ?Lab 12/14/21 ?0031 12/15/21 ?0227 12/16/21 ?5643 12/17/21 ?3295  ?NA 139 134* 136 133*  ?K 5.1 7.1* 5.1 5.8*  ?CL 95* 95* 96* 95*  ?CO2 26 22 25 23   ?GLUCOSE 206* 191* 193* 170*  ?BUN 47* 67* 34* 52*  ?CREATININE 5.40* 6.84* 4.06* 5.72*  ?CALCIUM 9.5 8.5* 9.0 8.9  ?MG  --  2.3 1.9  --   ?PHOS  --  8.1* 7.4* 7.8*  ? ? ? ?Liver Function Tests: ?Recent Labs  ?Lab 12/15/21 ?0227 12/16/21 ?1884 12/17/21 ?1660  ?ALBUMIN 3.4* 3.3* 3.1*  ? ? ?No results for input(s): LIPASE, AMYLASE in the last 168 hours. ?No results for input(s): AMMONIA in the last 168 hours. ? ?CBC: ?Recent Labs  ?Lab 12/14/21 ?0031 12/15/21 ?0227 12/16/21 ?6301  ?WBC 6.9 9.1 6.9  ?NEUTROABS 5.1 7.7 5.6  ?HGB 8.8* 6.7* 7.3*  ?HCT 28.1* 21.3* 22.9*  ?MCV 102.2* 101.9* 99.1  ?PLT 223 189 158  ? ? ? ?Cardiac Enzymes: ?No results for input(s): CKTOTAL, CKMB, CKMBINDEX, TROPONINI in the last 168 hours. ? ?BNP: ?Invalid input(s): POCBNP ? ?CBG: ?Recent Labs  ?Lab 12/16/21 ?0804 12/16/21 ?1226 12/16/21 ?1643 12/16/21 ?2037 12/17/21 ?0805  ?GLUCAP 117* 152* 156* 216* 120*  ? ? ? ?Microbiology: ?Results for orders placed or performed during the hospital encounter of 12/14/21  ?Surgical pcr screen     Status: None  ? Collection Time: 12/14/21 12:17 AM  ? Specimen: Nasal Mucosa; Nasal Swab  ?Result Value  Ref Range Status  ? MRSA, PCR NEGATIVE NEGATIVE Final  ? Staphylococcus aureus NEGATIVE NEGATIVE Final  ?  Comment: (NOTE) ?The Xpert SA Assay (FDA approved for NASAL specimens in patients 23 ?years of age and older), is one component of a comprehensive ?surveillance program. It is not intended to diagnose infection nor to ?guide or monitor treatment. ?Performed at [facility], [place], ?[place] [address] ?  ? ? ?Coagulation Studies: ?No results for  input(s): LABPROT, INR in the last 72 hours. ? ? ?Urinalysis: ?No results for input(s): COLORURINE, LABSPEC, [place], GLUCOSEU, HGBUR, BILIRUBINUR, KETONESUR, PROTEINUR, UROBILINOGEN, NITRITE, LEUKOCYTESUR in the last 72 hours. ? ?Invalid input(s): APPERANCEUR  ? ? ?Imaging: ?No results found. ? ? ?Medications:  ? ? tranexamic acid    ? ? sodium chloride   Intravenous Once  ? acetaminophen  1,000 mg Oral Q8H  ? Chlorhexidine Gluconate Cloth  6 each Topical Q0600  ? docusate sodium  100 mg Oral BID  ? heparin injection (subcutaneous)  5,000 Units Subcutaneous Q8H  ? insulin aspart  0-5 Units Subcutaneous QHS  ? insulin aspart  0-9 Units Subcutaneous TID WC  ? insulin glargine-yfgn  10 Units Subcutaneous Daily  ? losartan  50 mg Oral Daily  ? polyethylene glycol  17 g Oral Daily  ? senna-docusate  1 tablet Oral BID  ? ?bisacodyl, HYDROmorphone (DILAUDID) injection, oxyCODONE **OR** HYDROmorphone, oxyCODONE **OR** HYDROmorphone, menthol-cetylpyridinium **OR** phenol, methocarbamol, metoCLOPramide **OR** metoCLOPramide (REGLAN) injection, ondansetron **OR** ondansetron (ZOFRAN) IV, senna-docusate, traMADol ? ?Assessment/ Plan:  ?[name] is a 56 y.o.  female with a past medical history of hyperparathyroidism, anemia, osteoporosis, hypertension, type 2 diabetes, and end-stage renal disease on hemodialysis.  Patient presents to the emergency department complaining of right hip pain while ambulating with crutches.  Patient has been admitted for Closed right hip fracture, initial encounter ([place]) [S72.001A] ?Fracture of proximal end of right femur, closed, initial encounter ([place]) [S72.001A] ? ?[facility]/MWF/right PermCath ? ?End-stage renal disease on hemodialysis.  Will maintain outpatient schedule if possible.  Currently receiving dialysis, UF goal 1 L as tolerated.  Next treatment scheduled for Friday.  Dialysis coordinator monitoring discharge planning to assess changes with outpatient needs. ? ?2.  Anemia of chronic kidney disease ?Lab Results  ?Component Value Date  ? HGB 7.3 (L) 12/16/2021  ? Aranesp weekly outpatient ?Patient received 1 unit blood transfusion during previous dialysis session.  We will consider EPO with treatments during this hospitalization. ? ?3. Secondary Hyperparathyroidism:  ?Lab Results  ?Component Value Date  ? CALCIUM 8.9 12/17/2021  ? CAION 1.16 10/16/2021  ? PHOS 7.8 (H) 12/17/2021  ?Calcium remains within acceptable range.  Phosphorus elevated.  No binders prescribed at this time ? ?4. Diabetes mellitus type II with chronic kidney disease insulin dependent. Home regimen includes Novolog and Lantus. Most recent hemoglobin A1c is 6.2 on 09/29/21.  ? Glucose stable. Primary team to manage SSI ? ?5. Closed right hip fracture. Appreciate orthopedics performing right hip hemiarthroplasty on 12/14/21. ? Pain management and PT/OT.  PT/OT currently recommending rehab. ? ? LOS: 3 ?[place] ?4/19/202311:09 AM ?  ?

## 2021-12-17 NOTE — Progress Notes (Signed)
PT Cancellation Note ? ?Patient Details ?Name: [name] ?MRN: [id number] ?DOB: 04/21/1966 ? ? ?Cancelled Treatment:    Reason Eval/Treat Not Completed: Other (comment) (Attempted to see patient, upon entry patient reports she is about to go to dialysis. Will reattempt as able.) ? ? ?Iva Boop, PT  ?12/17/21. 8:53 AM ? ?

## 2021-12-17 NOTE — Progress Notes (Signed)
OT Cancellation Note ? ?Patient Details ?Name: [name] ?MRN: [id number] ?DOB: [birth year] ? ? ?Cancelled Treatment:    Reason Eval/Treat Not Completed: Other (comment) (Attempted to see pt, pt reports she is about to go to dialysis. Will reattempt as able.) ? ?Shanon Payor, OTD OTR/L  ?12/17/21, 8:51 AM  ?

## 2021-12-17 NOTE — Care Management Important Message (Signed)
Important Message ? ?Patient Details  ?Name: [name] ?MRN: [id number] ?Date of Birth: Jan 24, 1966 ? ? ?Medicare Important Message Given:  Yes ? ?Patient out of room upon time of visit.  Copy of Medicare IM left on beside tray for reference. ? ? ?[name] ?12/17/2021, 12:04 PM ?

## 2021-12-17 NOTE — Progress Notes (Signed)
Physical Therapy Treatment ?Patient Details ?Name: [name] ?MRN: [id number] ?DOB: 1965/11/14 ?Today's Date: 12/17/2021 ? ? ?History of Present Illness Pt is a 56 y.o. female presenting to hospital 4/16 c/o sudden R hip pain after trying to walk with her crutches and felt a pop (of note, pt also s/p R quad tendon repair surgery 2 months ago).  Imaging showing a displaced subcapital R femur fx.  Per notes pt also s/p L hip fx repair and R pubic ramus fx 08/2021 (both WBAT).  Pt now s/p R hip hemiarthroplasty d/t R femoral neck fx 12/14/21.  Scheduled for parathyroidectomy 01/07/22.  PMH includes L hip replacement 09/29/21, I&D L hip 11/15/21, R quadriceps tendon repair 10/16/21, R RCR 2004, htn, ESRD on HD with anemia of CKD, DM, stroke, neuropathy in feet, and osteoporosis. ? ?  ?PT Comments  ? ? Physical Therapy session completed this date. Patient tolerated session well and was agreeable to treatment. Upon entering room patient was in chair position in bed chatting with Dr. Posey Pronto. Patient initially declined therapy until after she ate her lunch, however with encouragement from MD and author patient agreed. Patient demonstrated good recall of slide board transfer from recliner<>chair this session. Patient continues to require Max A at BLEs to movement legs to EOB. Slide-board was placed under patient and chuck pad (for increased ease with transfer).  Once legs were positioned at EOB, patient was able to demonstrate slide board transfer from bed to recliner at CGA/SBA. RLE was held by Chief Strategy Officer throughout session, however no other physical assistance was required. Patient was left in recliner with all needs met and in reach. Patient would continue to benefit from skilled physical therapy in order optimize patient's return to PLOF. Continue to recommend STR upon discharge from acute hospitalization.  ?  ?Recommendations for follow up therapy are one component of a multi-disciplinary discharge planning process, led by the  attending physician.  Recommendations may be updated based on patient status, additional functional criteria and insurance authorization. ? ?Follow Up Recommendations ? Skilled nursing-short term rehab (<3 hours/day) ?  ?  ?Assistance Recommended at Discharge Frequent or constant Supervision/Assistance  ?Patient can return home with the following Assistance with cooking/housework;Assist for transportation;Help with stairs or ramp for entrance;A lot of help with walking and/or transfers;A lot of help with bathing/dressing/bathroom ?  ?Equipment Recommendations ? Other (comment) (patient has Slideboard, WC and BSC at home already)  ?  ?Recommendations for Other Services   ? ? ?  ?Precautions / Restrictions Precautions ?Precautions: Fall;Posterior Hip ?Precaution Booklet Issued: Yes (comment) ?Precaution Comments: Per MD note: "Continue with PT/OT after surgery but exercise caution and probably not be as aggressive as usual given pathologic fracture especially until parathyroidectomy able to be performed".  Hinged knee brace locked in extension d/t h/o R quad tendon repair (per pt she can unlock brace for PROM up to 90 degrees but brace must be locked in extension for any functional mobility) ?Required Braces or Orthoses: Knee Immobilizer - Right ?Knee Immobilizer - Right: On at all times ?Restrictions ?Weight Bearing Restrictions: Yes ?RLE Weight Bearing: Weight bearing as tolerated ?Other Position/Activity Restrictions: Pt reports she has progressed to WBAT per R quad tendon repair rehab protocol (with hinged knee brace locked in extension)  ?  ? ?Mobility ? Bed Mobility ?Overal bed mobility: Needs Assistance ?Bed Mobility: Supine to Sit ?  ?  ?Supine to sit: Mod assist, Max assist ?  ?  ?General bed mobility comments: assist for B LE's ?  ? ?  Transfers ?Overall transfer level: Needs assistance ?Equipment used: Sliding board ?Transfers: Bed to chair/wheelchair/BSC ?  ?  ?  ?  ?  ? Lateral/Scoot Transfers: Min guard  (SBA) ?General transfer comment: slideboard transfer frmo bed ro recliner, R side with mod-max assist x1 to get to EOB, SBA/CGA with use ofbed sheet under pt/over slideboard to allow smoother/easier transfers; Patient demonstrated good recall of technique utilized in last session ?  ? ?Ambulation/Gait ?  ?  ?  ?  ?  ?  ?  ?General Gait Details: Deferred ambulation (goals for w/c level at this time) ? ? ?Stairs ?  ?  ?  ?  ?  ? ? ?Wheelchair Mobility ?  ? ?Modified Rankin (Stroke Patients Only) ?  ? ? ?  ?Balance Overall balance assessment: Needs assistance ?Sitting-balance support: No upper extremity supported, Feet supported ?Sitting balance-Leahy Scale: Good ?Sitting balance - Comments: steady sitting reaching within BOS ?  ?  ?  ?  ?  ?  ?  ?  ?  ?  ?  ?  ?  ?  ?  ?  ? ?  ?Cognition Arousal/Alertness: Awake/alert ?Behavior During Therapy: WFL for tasks assessed/performed ?Overall Cognitive Status: Within Functional Limits for tasks assessed ?  ?  ?  ?  ?  ?  ?  ?  ?  ?  ?  ?  ?  ?  ?  ?  ?General Comments: pleasant to work with ?  ?  ? ?  ?Exercises   ? ?  ?General Comments General comments (skin integrity, edema, etc.): RLE hinged knee brace in place (locked in extension) ?  ?  ? ?Pertinent Vitals/Pain Pain Assessment ?Pain Assessment: 0-10 ?Pain Score: 2  ?Faces Pain Scale: Hurts a little bit ?Pain Location: R hip ?Pain Descriptors / Indicators: Sore ?Pain Intervention(s): Monitored during session, Limited activity within patient's tolerance, Repositioned  ? ? ?Home Living   ?  ?  ?  ?  ?  ?  ?  ?  ?  ?   ?  ?Prior Function    ?  ?  ?   ? ?PT Goals (current goals can now be found in the care plan section) Acute Rehab PT Goals ?Patient Stated Goal: to go home ?PT Goal Formulation: With patient ?Time For Goal Achievement: 12/30/21 ?Potential to Achieve Goals: Fair ?Progress towards PT goals: Progressing toward goals ? ?  ?Frequency ? ? ? BID ? ? ? ?  ?PT Plan Current plan remains appropriate  ? ? ?Co-evaluation    ?  ?  ?  ?  ? ?  ?AM-PAC PT "6 Clicks" Mobility   ?Outcome Measure ? Help needed turning from your back to your side while in a flat bed without using bedrails?: A Lot ?Help needed moving from lying on your back to sitting on the side of a flat bed without using bedrails?: Total ?Help needed moving to and from a bed to a chair (including a wheelchair)?: A Lot ?Help needed standing up from a chair using your arms (e.g., wheelchair or bedside chair)?: Total ?Help needed to walk in hospital room?: Total ?Help needed climbing 3-5 steps with a railing? : Total ?6 Click Score: 8 ? ?  ?End of Session Equipment Utilized During Treatment: Other (comment) (Slide board, R knee brace locked in extension) ?Activity Tolerance: Patient tolerated treatment well ?Patient left: in chair;with call bell/phone within reach ?Nurse Communication: Mobility status;Precautions;Weight bearing status ?PT Visit Diagnosis: Other abnormalities of   gait and mobility (R26.89);Muscle weakness (generalized) (M62.81);Pain ?Pain - Right/Left: Right ?Pain - part of body: Hip ?  ? ? ?Time: 1345-1359 ?PT Time Calculation (min) (ACUTE ONLY): 14 min ? ?Charges:  $Therapeutic Activity: 8-22 mins          ?          ?Christina Mullen, PT  ?12/17/21. 2:11 PM ? ? ?

## 2021-12-17 NOTE — Progress Notes (Signed)
[place] at [facility] ? ? ?PATIENT NAME: [name]   ? ?MR#:  [id number] ? ?DATE OF BIRTH:  1966/02/08 ? ?SUBJECTIVE:  ?patient is postop day 4 right hip surgery. Complains of pain.  ?Seen after HD, eating lunch.  ? ? ?VITALS:  ?Blood pressure (!) 156/64, pulse 99, temperature 98.3 ?F (36.8 ?C), resp. rate 18, height 5\' 6"  (1.676 m), weight 76.5 kg, SpO2 97 %. ? ?PHYSICAL EXAMINATION:  ? ?GENERAL:  56 y.o.-year-old patient lying in the bed with no acute distress.  ?LUNGS: Normal breath sounds bilaterally, no wheezing, rales, rhonchi.  ?CARDIOVASCULAR: S1, S2 normal. No murmurs, rubs, or gallops.  ?ABDOMEN: Soft, nontender, nondistended. Bowel sounds present.  ?EXTREMITIES: right knee brace, right hip dressing    ?NEUROLOGIC: nonfocal  patient is alert and awake ?SKIN: No obvious rash, lesion, or ulcer.  ? ?LABORATORY PANEL:  ?CBC ?Recent Labs  ?Lab 12/16/21 ?3710  ?WBC 6.9  ?HGB 7.3*  ?HCT 22.9*  ?PLT 158  ? ? ? ?Chemistries  ?Recent Labs  ?Lab 12/16/21 ?6269 12/17/21 ?4854  ?NA 136 133*  ?K 5.1 5.8*  ?CL 96* 95*  ?CO2 25 23  ?GLUCOSE 193* 170*  ?BUN 34* 52*  ?CREATININE 4.06* 5.72*  ?CALCIUM 9.0 8.9  ?MG 1.9  --   ? ? ? ?Assessment and Plan ?[name] is a 56 yo female with PMH CKD, DMII, GERD, ESRD on HD, neuropathy, HTN, anxiety, anemia of chronic disease who presented with right hip pain after ambulating on her crutches and hearing a pop sensation.  She had denied a fall on admission. ?She had left hip hemiarthroplasty on 09/29/2021.  She then had dehiscence of her incision and underwent irrigation and debridement and wound VAC placement on 11/15/2021.  ?  ?Imaging studies on admission showed an acute right proximal femur fracture.  She was evaluated by orthopedic surgery with recommendations for surgical repair. ? ?Fracture proximal femur right closed, pathologic ?-- patient heard a pop sensation when ambulating with crutches ?-- underwent right hip surgery 12/14/21 by Dr. Posey Pronto  orthopedic ?-- PRN pain meds ?-- PT to see patient-- recommends rehab ? ?secondary hyperparathyroidism, renal origin ?-- continue vitamin D ?-- last PTH was 3330 11/11/2021 ?-- patient is scheduled for parathyroidectomy in May at [facility], Dr. Maudie Mercury ? ?osteoporosis with recurrent pathology fracture ?-- continue vitamin D ? ?History of fracture of left hip ?- s/p left hip hemiarthroplasty on 09/29/2021 followed by wound dehiscence with I&D and wound VAC on 11/15/2021 ? ?end-stage renal disease on hemodialysis ?-- dialysis ? ?type I diabetes with renal manifestations ?-- continue sliding scale ?--Insulin Semglee 10 units qday ? ?Procedures: right hip surgery ?Family communication :none today ?Consults : orthopedic, nephrology ?CODE STATUS: full  ?DVT Prophylaxis : heparin ?Level of care: Med-Surg ?Status is: Inpatient ?Remains inpatient appropriate because: post right hip surgery. TOC for discharge planning to rehab tomorrow to [facility]. Pt agreeable ?  ? ?TOTAL TIME TAKING CARE OF THIS PATIENT: 25 minutes.  ?>50% time spent on counselling and coordination of care ? ?Note: This dictation was prepared with Dragon dictation along with smaller phrase technology. Any transcriptional errors that result from this process are unintentional. ? ?Fritzi Mandes M.D  ? ? ?[facility]  ? ?CC: ?Primary care physician; Pcp, No  ?

## 2021-12-17 NOTE — Progress Notes (Signed)
Physical Therapy Treatment ?Patient Details ?Name: [name] ?MRN: [id number] ?DOB: July 18, 1966 ?Today's Date: 12/17/2021 ? ? ?History of Present Illness Pt is a 56 y.o. female presenting to hospital 4/16 c/o sudden R hip pain after trying to walk with her crutches and felt a pop (of note, pt also s/p R quad tendon repair surgery 2 months ago).  Imaging showing a displaced subcapital R femur fx.  Per notes pt also s/p L hip fx repair and R pubic ramus fx 08/2021 (both WBAT).  Pt now s/p R hip hemiarthroplasty d/t R femoral neck fx 12/14/21.  Scheduled for parathyroidectomy 01/07/22.  PMH includes L hip replacement 09/29/21, I&D L hip 11/15/21, R quadriceps tendon repair 10/16/21, R RCR 2004, htn, ESRD on HD with anemia of CKD, DM, stroke, neuropathy in feet, and osteoporosis. ? ?  ?PT Comments  ? ? Pt was sitting in recliner upon arriving. Pt is well know by author from a previous admission. She is eager to return to bed. Pt performed slide board transfer back to bed with mostly just set up assist. She did require extensive assistance to reposition to [facility] afterwards. Overall pt is doing well. Recommend STR to improve independence prior to returning home. Pt is very limited currently due to restrictions and medical limitations.  ?  ?Recommendations for follow up therapy are one component of a multi-disciplinary discharge planning process, led by the attending physician.  Recommendations may be updated based on patient status, additional functional criteria and insurance authorization. ? ?Follow Up Recommendations ? Skilled nursing-short term rehab (<3 hours/day) ?  ?  ?Assistance Recommended at Discharge Frequent or constant Supervision/Assistance  ?Patient can return home with the following Assistance with cooking/housework;Assist for transportation;Help with stairs or ramp for entrance;A lot of help with walking and/or transfers;A lot of help with bathing/dressing/bathroom ?  ?Equipment Recommendations ? Other (comment)  (slideboard)  ?  ?   ?Precautions / Restrictions Precautions ?Precautions: Fall;Posterior Hip ?Precaution Booklet Issued: Yes (comment) ?Precaution Comments: Per MD note: "Continue with PT/OT after surgery but exercise caution and probably not be as aggressive as usual given pathologic fracture especially until parathyroidectomy able to be performed".  Hinged knee brace locked in extension d/t h/o R quad tendon repair (per pt she can unlock brace for PROM up to 90 degrees but brace must be locked in extension for any functional mobility) ?Required Braces or Orthoses: Knee Immobilizer - Right ?Knee Immobilizer - Right: On at all times ?Restrictions ?Weight Bearing Restrictions: Yes ?RLE Weight Bearing: Weight bearing as tolerated ?Other Position/Activity Restrictions: Pt reports she has progressed to WBAT per R quad tendon repair rehab protocol (with hinged knee brace locked in extension)  ?  ? ?Mobility ? Bed Mobility ?Overal bed mobility: Needs Assistance ?Bed Mobility: Supine to Sit, Sit to Supine ?  ?  ?  ?Sit to supine: Mod assist ?  ?General bed mobility comments: Pt required mod assist to reposition to [facility] from FOB. ?  ? ?Transfers ?Overall transfer level: Needs assistance ?Equipment used: Sliding board ?Transfers: Bed to chair/wheelchair/BSC ?  ?  ?  ?  ?  ? Lateral/Scoot Transfers: With slide board, Supervision ?General transfer comment: no physical assistance required however vcs for improved technique and assistance with correct placement of slideboard ?  ? ?Ambulation/Gait ?  ? General Gait Details: Deferred ambulation (goals for w/c level at this time) ? ?  ?Balance Overall balance assessment: Needs assistance ?Sitting-balance support: No upper extremity supported, Feet supported ?Sitting balance-Leahy Scale: Good ?Sitting balance -  Comments: steady sitting reaching within BOS ?  ?  ?  ?Cognition Arousal/Alertness: Awake/alert ?Behavior During Therapy: [facility] for tasks assessed/performed ?Overall Cognitive  Status: Within Functional Limits for tasks assessed ?  ?   ?General Comments: pt is A and O x 4 ?  ?  ? ?  ?   ?General Comments General comments (skin integrity, edema, etc.): RLE hinged knee brace in place (locked in extension) ?  ?  ? ?Pertinent Vitals/Pain Pain Assessment ?Pain Assessment: 0-10 ?Pain Score: 4  ?Faces Pain Scale: Hurts a little bit ?Pain Location: R hip ?Pain Descriptors / Indicators: Sore ?Pain Intervention(s): Limited activity within patient's tolerance, Monitored during session, Premedicated before session, Repositioned  ? ? ? ?PT Goals (current goals can now be found in the care plan section) Acute Rehab PT Goals ?Patient Stated Goal: to go home ?PT Goal Formulation: With patient ?Time For Goal Achievement: 12/30/21 ?Potential to Achieve Goals: Fair ?Progress towards PT goals: Progressing toward goals ? ?  ?Frequency ? ? ? BID ? ? ? ?  ?PT Plan Current plan remains appropriate  ? ? ?Co-evaluation   ?  ?PT goals addressed during session: Mobility/safety with mobility;Balance;Proper use of DME ?  ?  ? ?  ?AM-PAC PT "6 Clicks" Mobility   ?Outcome Measure ? Help needed turning from your back to your side while in a flat bed without using bedrails?: A Little ?Help needed moving from lying on your back to sitting on the side of a flat bed without using bedrails?: A Lot ?Help needed moving to and from a bed to a chair (including a wheelchair)?: A Little ?Help needed standing up from a chair using your arms (e.g., wheelchair or bedside chair)?: Total ?Help needed to walk in hospital room?: Total ?Help needed climbing 3-5 steps with a railing? : Total ?6 Click Score: 11 ? ?  ?End of Session Equipment Utilized During Treatment: Other (comment) (slideboard) ?Activity Tolerance: Patient tolerated treatment well ?Patient left: in bed;with bed alarm set ?Nurse Communication: Mobility status;Precautions;Weight bearing status ?PT Visit Diagnosis: Other abnormalities of gait and mobility (R26.89);Muscle  weakness (generalized) (M62.81);Pain ?Pain - Right/Left: Right ?Pain - part of body: Hip ?  ? ? ?Time: 2706-2376 ?PT Time Calculation (min) (ACUTE ONLY): 18 min ? ?Charges:  $Therapeutic Activity: 8-22 mins          ?          ?Julaine Fusi PTA ?12/17/21, 3:43 PM  ? ?

## 2021-12-17 NOTE — Progress Notes (Signed)
Vitals entered manually ° °

## 2021-12-17 NOTE — Progress Notes (Signed)
?Subjective: ?3 Days Post-Op Procedure(s) (LRB): ?ARTHROPLASTY BIPOLAR HIP (HEMIARTHROPLASTY) (Right) ?APPLICATION OF WOUND VAC (Right) ?Patient reports pain as mild to moderate.   ?Patient is well, and has had no acute complaints or problems ?Plan is to go to rehab after hospital stay. ?Negative for chest pain and shortness of breath ?Fever: no ?Gastrointestinal: Negative for nausea and vomiting ? ?Objective: ?Vital signs in last 24 hours: ?Temp:  [98.2 ?F (36.8 ?C)-99.4 ?F (37.4 ?C)] 98.9 ?F (37.2 ?C) (04/19 0453) ?Pulse Rate:  [84-98] 84 (04/19 0453) ?Resp:  [16-20] 20 (04/19 0453) ?BP: (121-151)/(60-73) 127/62 (04/19 0453) ?SpO2:  [92 %-99 %] 94 % (04/19 0453) ? ?Intake/Output from previous day: ? ?Intake/Output Summary (Last 24 hours) at 12/17/2021 3299 ?Last data filed at 12/16/2021 1854 ?Gross per 24 hour  ?Intake 330 ml  ?Output --  ?Net 330 ml  ?  ?Intake/Output this shift: ?No intake/output data recorded. ? ?Labs: ?Recent Labs  ?  12/15/21 ?0227 12/16/21 ?2426  ?HGB 6.7* 7.3*  ? ?Recent Labs  ?  12/15/21 ?0227 12/16/21 ?8341  ?WBC 9.1 6.9  ?RBC 2.09* 2.31*  ?HCT 21.3* 22.9*  ?PLT 189 158  ? ?Recent Labs  ?  12/16/21 ?9622 12/17/21 ?2979  ?NA 136 133*  ?K 5.1 5.8*  ?CL 96* 95*  ?CO2 25 23  ?BUN 34* 52*  ?CREATININE 4.06* 5.72*  ?GLUCOSE 193* 170*  ?CALCIUM 9.0 8.9  ? ?No results for input(s): LABPT, INR in the last 72 hours. ? ? ? ?EXAM ?General - Patient is Alert and Oriented ?Extremity - Neurovascular intact ?Sensation intact distally ?Dorsiflexion/Plantar flexion intact ?Compartment soft ?Dressing/Incision -clean dry and wound VAC in place. ? ?Motor Function - intact, moving foot and toes well on exam.  Limited ambulation with physical therapy. ? ?Past Medical History:  ?Diagnosis Date  ? Anemia   ? vitamin d3 deficiency  ? Anxiety   ? Chronic kidney disease   ? End Stage Renal Disease  ? Diabetes mellitus without complication ([place])   ? GERD (gastroesophageal reflux disease)   ? nothing over last few  years  ? High serum parathyroid hormone (PTH)   ? checked through Dialysis  ? History of kidney stones 2000  ? Hypertension   ? Neuromuscular disorder ([place])   ? neuropathy in feet  ? PONV (postoperative nausea and vomiting)   ? severe nausea requiring many doses of post op antiemetics  ? Stroke [facility]) 10/2017  ? thinks she had a series of mini strokes.right leg up to right side of face were numb. no loss of consciousness  ? ? ?Assessment/Plan: ?3 Days Post-Op Procedure(s) (LRB): ?ARTHROPLASTY BIPOLAR HIP (HEMIARTHROPLASTY) (Right) ?APPLICATION OF WOUND VAC (Right) ?Principal Problem: ?  Fracture, proximal femur, right, closed, initial encounter ([place]) ?Active Problems: ?  Secondary hyperparathyroidism of renal origin ([place]) ?  End stage renal disease on dialysis [facility]) ?  Type 1 diabetes mellitus ([place]) ?  Essential hypertension ?  Anemia of chronic disease ?  History of fracture of left hip ?  Osteoporosis with current pathological fracture ?  Fracture of proximal end of right femur, closed, initial encounter ([place]) ?  Constipation ? ?Estimated body mass index is 25.34 kg/m? as calculated from the following: ?  Height as of this encounter: 5\' 6"  (1.676 m). ?  Weight as of this encounter: 71.2 kg. ? ?Physical therapy and Occupational Therapy. ?Progress diet. ? ?Acute blood loss anemia.  Postsurgical.  Hemoglobin 7.3 after 1 unit of transfused blood. ? ?Discharge planning to home  versus rehab. ?Follow-up with [facility] orthopedics in 2 weeks for staple removal and x-ray of the right hip. ? ?DVT Prophylaxis -heparin, support stockings, compression stockings. ?Weight-Bearing as tolerated to right leg ? ?Reche Dixon, PA-C ?Orthopaedic Surgery ?12/17/2021, 7:12 AM ? ?

## 2021-12-17 NOTE — Progress Notes (Signed)
OT Cancellation Note ? ?Patient Details ?Name: [name] ?MRN: [id number] ?DOB: 17-Jun-1966 ? ? ?Cancelled Treatment:    Reason Eval/Treat Not Completed: Other (comment);Patient declined, no reason specified (pt declined at this time, reports she wants to eat lunch in bed, requests therapist to come back at a later time. OT will re-attempt as able.) ?Shanon Payor, OTD OTR/L  ?12/17/21, 1:28 PM  ?

## 2021-12-18 DIAGNOSIS — S72001A Fracture of unspecified part of neck of right femur, initial encounter for closed fracture: Secondary | ICD-10-CM | POA: Diagnosis not present

## 2021-12-18 LAB — RENAL FUNCTION PANEL
Albumin: 3.1 g/dL — ABNORMAL LOW (ref 3.5–5.0)
Anion gap: 10 (ref 5–15)
BUN: 35 mg/dL — ABNORMAL HIGH (ref 6–20)
CO2: 26 mmol/L (ref 22–32)
Calcium: 9 mg/dL (ref 8.9–10.3)
Chloride: 96 mmol/L — ABNORMAL LOW (ref 98–111)
Creatinine, Ser: 3.88 mg/dL — ABNORMAL HIGH (ref 0.44–1.00)
GFR, Estimated: 13 mL/min — ABNORMAL LOW (ref >60)
Glucose, Bld: 165 mg/dL — ABNORMAL HIGH (ref 70–99)
Phosphorus: 7.2 mg/dL — ABNORMAL HIGH (ref 2.5–4.6)
Potassium: 5.1 mmol/L (ref 3.5–5.1)
Sodium: 132 mmol/L — ABNORMAL LOW (ref 135–145)

## 2021-12-18 LAB — GLUCOSE, CAPILLARY
Glucose-Capillary: 137 mg/dL — ABNORMAL HIGH (ref 70–99)
Glucose-Capillary: 192 mg/dL — ABNORMAL HIGH (ref 70–99)

## 2021-12-18 MED ORDER — OXYCODONE HCL 5 MG PO TABS: 5.0000 mg | ORAL_TABLET | Freq: Four times a day (QID) | ORAL | Status: DC | PRN

## 2021-12-18 MED ORDER — INSULIN GLARGINE-YFGN 100 UNIT/ML ~~LOC~~ SOPN: 12.0000 [IU] | PEN_INJECTOR | Freq: Every day | SUBCUTANEOUS | 2 refills | Status: DC

## 2021-12-18 MED ORDER — POLYETHYLENE GLYCOL 3350 17 G PO PACK: 17.0000 g | PACK | Freq: Every day | ORAL | 0 refills | Status: DC

## 2021-12-18 MED ORDER — DOCUSATE SODIUM 100 MG PO CAPS: 100.0000 mg | ORAL_CAPSULE | Freq: Two times a day (BID) | ORAL | 0 refills | Status: DC

## 2021-12-18 MED ORDER — HEPARIN SODIUM (PORCINE) 5000 UNIT/ML IJ SOLN: 5000.0000 [IU] | Freq: Three times a day (TID) | INTRAMUSCULAR | 0 refills | Status: DC

## 2021-12-18 NOTE — Progress Notes (Signed)
PT Cancellation Note ? ?Patient Details ?Name: [name] ?MRN: [id number] ?DOB: 1966-01-17 ? ? ?Cancelled Treatment:    Reason Eval/Treat Not Completed: Other (comment) (Attempted to see patient. Upon arrival patient was sleeping soundly. Awoke easily to her name. Per patient she is really groggy from pain medication and is not up for therapy right now. Patient requested therapist come back later as she really wants to get into the chair.) Will re-attempt at a later time/date as available and patient medically appropriate for PT. Thank you!  ? ? ?Iva Boop, PT  ?12/18/21. 8:20 AM ? ?

## 2021-12-18 NOTE — TOC Progression Note (Signed)
Transition of Care (TOC) - Progression Note  ? ? ?Patient Details  ?Name: [name] ?MRN: [id number] ?Date of Birth: 02-14-1966 ? ?Transition of Care (TOC) CM/SW Contact  ?Conception Oms, RN ?Phone Number: ?12/18/2021, 11:10 AM ? ?Clinical Narrative:    ? ?[facility] going to [facility] room 806 ?EMS to transport ?EMS called and she is 2nd on list ?She will notify her family ? ?Expected Discharge Plan: [place] ?Barriers to Discharge: Continued Medical Work up, SNF Pending bed offer ? ?Expected Discharge Plan and Services ?Expected Discharge Plan: [place] ?  ?  ?  ?  ?Expected Discharge Date: 12/18/21               ?  ?  ?  ?  ?  ?  ?  ?  ?  ?  ? ? ?Social Determinants of Health (SDOH) Interventions ?  ? ?Readmission Risk Interventions ?   ? View : No data to display.  ?  ?  ?  ? ? ?

## 2021-12-18 NOTE — Progress Notes (Signed)
PIV removed, Switched wound vac to prevana, AVS packet given and discharge, belongings reviewed and returned to patient ?

## 2021-12-18 NOTE — Progress Notes (Signed)
Picked- up by EMS, report given ?

## 2021-12-18 NOTE — Plan of Care (Signed)
  Problem: Education: Goal: Knowledge of General Education information will improve Description: Including pain rating scale, medication(s)/side effects and non-pharmacologic comfort measures Outcome: Adequate for Discharge   Problem: Health Behavior/Discharge Planning: Goal: Ability to manage health-related needs will improve Outcome: Adequate for Discharge   Problem: Clinical Measurements: Goal: Ability to maintain clinical measurements within normal limits will improve Outcome: Adequate for Discharge Goal: Will remain free from infection Outcome: Adequate for Discharge Goal: Diagnostic test results will improve Outcome: Adequate for Discharge Goal: Respiratory complications will improve Outcome: Adequate for Discharge Goal: Cardiovascular complication will be avoided Outcome: Adequate for Discharge   Problem: Activity: Goal: Risk for activity intolerance will decrease Outcome: Adequate for Discharge   Problem: Nutrition: Goal: Adequate nutrition will be maintained Outcome: Adequate for Discharge   Problem: Elimination: Goal: Will not experience complications related to bowel motility Outcome: Adequate for Discharge Goal: Will not experience complications related to urinary retention Outcome: Adequate for Discharge   Problem: Pain Managment: Goal: General experience of comfort will improve Outcome: Adequate for Discharge   Problem: Safety: Goal: Ability to remain free from injury will improve Outcome: Adequate for Discharge   Problem: Skin Integrity: Goal: Risk for impaired skin integrity will decrease Outcome: Adequate for Discharge   

## 2021-12-18 NOTE — Discharge Summary (Signed)
?Physician Discharge Summary ?  ?Patient: [name] MRN: [id number] DOB: Mar 08, 1966  ?Admit date:     12/14/2021  ?Discharge date: 12/18/21  ?Discharge Physician: Fritzi Mandes  ? ?PCP: Pcp, No  ? ?Recommendations at discharge:  ? ?resume your outpatient hemodialysis Monday Wednesday Friday ?follow-up orthopedic and 1 to 2 weeks ?patient will follow up with PCP after discharge from rehab ? ?Discharge Diagnoses: ?right proximal fracture female status post surgery ? ?Hospital Course: ?Ms. Titzer is a 56 yo female with PMH CKD, DMII, GERD, ESRD on HD, neuropathy, HTN, anxiety, anemia of chronic disease who presented with right hip pain after ambulating on her crutches and hearing a pop sensation.  She had denied a fall on admission. ?She had left hip hemiarthroplasty on 09/29/2021.  She then had dehiscence of her incision and underwent irrigation and debridement and wound VAC placement on 11/15/2021.  ?  ?Imaging studies on admission showed an acute right proximal femur fracture.  She was evaluated by orthopedic surgery with recommendations for surgical repair. ?  ?Fracture proximal femur right closed, pathologic ?-- patient heard a pop sensation when ambulating with crutches ?-- underwent right hip surgery 12/14/21 by Dr. Posey Pronto orthopedic ?-- PRN pain meds ?-- PT to see patient-- recommends rehab ?  ?secondary hyperparathyroidism, renal origin ?-- continue vitamin D ?-- last PTH was 3330 11/11/2021 ?-- patient is scheduled for par[facility]roidectomy in May at [facility], Dr. Maudie Mercury ?  ?osteoporosis with recurrent pathology fracture ?-- continue vitamin D ?  ?History of fracture of left hip ?- s/p left hip hemiarthroplasty on 09/29/2021 followed by wound dehiscence with I&D and wound VAC on 11/15/2021 ?  ?end-stage renal disease on hemodialysis ?-- dialysis ?  ?type I diabetes with renal manifestations ?-- continue sliding scale ?--Insulin Semglee 10 units qday ?  ?Procedures: right hip surgery ?Family communication :none today ?Consults :  orthopedic, nephrology ?CODE STATUS: full  ?DVT Prophylaxis : heparin ?  ? ? ? ?Disposition: Skilled nursing facility ?Diet recommendation:  ?Discharge Diet Orders (From admission, onward)  ? ?  Start     Ordered  ? 12/18/21 0000  Diet - low sodium heart healthy       ? 12/18/21 0850  ? ?  ?  ? ?  ? ?Renal diet ?DISCHARGE MEDICATION: ?Allergies as of 12/18/2021   ? ?   Reactions  ? Enalapril Hives, Other (See Comments)  ? Angioedema face.  ? Ivp Dye [iodinated Contrast Media] Hives  ? Lisinopril Shortness Of Breath  ? ?  ? ?  ?Medication List  ?  ? ?STOP taking these medications   ? ?insulin aspart 100 unit/mL injection ?Commonly known as: novoLOG ?  ?insulin glargine 100 UNIT/ML Solostar Pen ?Commonly known as: LANTUS ?  ? ?  ? ?TAKE these medications   ? ?acetaminophen 325 MG tablet ?Commonly known as: TYLENOL ?Take 2 tablets (650 mg total) by mouth 3 (three) times daily. ?  ?docusate sodium 100 MG capsule ?Commonly known as: COLACE ?Take 1 capsule (100 mg total) by mouth 2 (two) times daily. ?  ?heparin 5000 UNIT/ML injection ?Inject 1 mL (5,000 Units total) into the skin every 8 (eight) hours for 14 days. ?  ?insulin glargine-yfgn 100 UNIT/ML Pen ?Commonly known as: Semglee (yfgn) ?Inject 12 Units into the skin daily. ?  ?losartan 50 MG tablet ?Commonly known as: COZAAR ?Take 50 mg by mouth daily. ?  ?methocarbamol 750 MG tablet ?Commonly known as: ROBAXIN ?Take 1 tablet (750 mg total) by mouth every 8 (  eight) hours as needed for muscle spasms. ?  ?ondansetron 4 MG tablet ?Commonly known as: ZOFRAN ?Take 1 tablet (4 mg total) by mouth every 6 (six) hours as needed for nausea. ?  ?oxyCODONE 5 MG immediate release tablet ?Commonly known as: Oxy IR/ROXICODONE ?Take 1-2 tablets (5-10 mg total) by mouth every 4 (four) hours as needed for moderate pain (pain score 4-6). ?  ?polyethylene glycol 17 g packet ?Commonly known as: MIRALAX / GLYCOLAX ?Take 17 g by mouth daily. ?Start taking on: December 19, 2021 ?  ?traMADol 50  MG tablet ?Commonly known as: ULTRAM ?Take 1 tablet (50 mg total) by mouth every 6 (six) hours as needed for moderate pain. ?  ?Vitamin D (Ergocalciferol) 1.25 MG (50000 UNIT) Caps capsule ?Commonly known as: DRISDOL ?Take 1 capsule (50,000 Units total) by mouth every 7 (seven) days. ?  ? ?  ? ?  ?  ? ? ?  ?Discharge Care Instructions  ?(From admission, onward)  ?  ? ? ?  ? ?  Start     Ordered  ? 12/18/21 0000  Discharge wound care:       ?Comments: Reinforce dressing  As needed  ? 12/18/21 0850  ? ?  ?  ? ?  ? ? Follow-up Information   ? ? Reche Dixon, PA-C Follow up in 2 week(s).   ?Specialty: Orthopedic Surgery ?Why: For staple removal and x-rays of the right hip ?Contact information: ?[address] ?[place] ?[place] [address] ?[phone] ? ? ?  ?  ? ?  ?  ? ?  ? ?Discharge Exam: ?Filed Weights  ? 12/14/21 0039 12/17/21 0908 12/17/21 1220  ?Weight: 71.2 kg 77.7 kg 76.5 kg  ? ? ? ?Condition at discharge: fair ? ?The results of significant diagnostics from this hospitalization (including imaging, microbiology, ancillary and laboratory) are listed below for reference.  ? ?Imaging Studies: ?DG Chest 1 View ? ?Result Date: 12/14/2021 ?CLINICAL DATA:  Right hip pain. EXAM: CHEST  1 VIEW COMPARISON:  September 28, 2021 FINDINGS: There is stable right-sided venous catheter positioning. The heart size and mediastinal contours are within normal limits. Both lungs are clear. The visualized skeletal structures are unremarkable. IMPRESSION: No active disease. Electronically Signed   By: Virgina Norfolk M.D.   On: 12/14/2021 01:37  ? ?DG Pelvis Portable ? ?Result Date: 12/14/2021 ?CLINICAL DATA:  Status post right hip arthroplasty EXAM: PORTABLE PELVIS 1-2 VIEWS COMPARISON:  Abdomen radiographs done on 10/13/2021 FINDINGS: There is interval right hip arthroplasty. There is previous left hip arthroplasty. No fracture is seen. Skin staples are seen. There are pockets of air in the soft tissues around the  right hip. IMPRESSION: Status post right hip arthroplasty. Electronically Signed   By: Elmer Picker M.D.   On: 12/14/2021 14:14  ? ?CT HIP RIGHT WO CONTRAST ? ?Result Date: 12/14/2021 ?CLINICAL DATA:  Initial evaluation for acute trauma, fracture. EXAM: CT OF THE RIGHT HIP WITHOUT CONTRAST TECHNIQUE: Multidetector CT imaging of the right hip was performed according to the standard protocol. Multiplanar CT image reconstructions were also generated. RADIATION DOSE REDUCTION: This exam was performed according to the departmental dose-optimization program which includes automated exposure control, adjustment of the mA and/or kV according to patient size and/or use of iterative reconstruction technique. COMPARISON:  Prior radiograph from earlier the same day. FINDINGS: Bones/Joint/Cartilage Acute transverse fracture seen extending through the right femoral neck with superior subluxation. No significant comminution. Extension through the greater trochanter noted (series 9,  image 63). Femoral head remains intact. No subtrochanteric extension. Probable associated acute nondisplaced fracture with impaction at the acetabular cup (series 4, image 113). Remainder of the acetabulum otherwise grossly intact. Remotely healed fracture of the inferior left pubic ramus. Pubis symphysis is widened up to 1.6 cm. Remainder of the visualized bony pelvis otherwise intact. No discrete or worrisome osseous lesions. Ligaments Suboptimally assessed by CT. Muscles and Tendons Visualized musculature intact. Soft tissues Soft tissue swelling and/or hemorrhage present about the acute femoral neck fracture. Suspected mild contusion within the subcutaneous fat overlying the hip/proximal femur. Moderate to large volume stool within the distal colon, suggesting constipation. Remainder of the visualized bowel unremarkable. Appendix within normal limits. Partially visualized bladder grossly intact. Prominent vascular calcifications noted within  the pelvis. No visible adenopathy. Small fat containing right inguinal hernia noted. IMPRESSION: 1. Acute transverse fracture extending through the right femoral neck with extension through the greater tr

## 2021-12-18 NOTE — Progress Notes (Signed)
Report given to Ubaldo Glassing, [place] ([facility]) ?

## 2021-12-18 NOTE — Progress Notes (Signed)
?[facility]  ?ROUNDING NOTE  ? ?Subjective:  ? ?[name] is a 56 y.o. female with a past medical history of hyperparathyroidism, anemia, osteoporosis, hypertension, type 2 diabetes, and end-stage renal disease on hemodialysis.  Patient presents to the emergency department complaining of right hip pain while ambulating with crutches.  Patient has been admitted for Closed right hip fracture, initial encounter ([place]) [S72.001A] ?Fracture of proximal end of right femur, closed, initial encounter ([place]) [S72.001A] ? ?Patient is known to our practice and receives outpatient dialysis treatments at [facility] on a MWF schedule, supervised by Dr. Holley Raring.   ? ?Patient resting in bed ?Reports arm discomfort overnight due to frequent turning during bathing ?Resolved with muscle relaxer.  ? ? ?Objective:  ?Vital signs in last 24 hours:  ?Temp:  [98 ?F (36.7 ?C)-100.1 ?F (37.8 ?C)] 98 ?F (36.7 ?C) (04/20 0439) ?Pulse Rate:  [84-99] 84 (04/20 0439) ?Resp:  [18-20] 20 (04/20 0439) ?BP: (137-156)/(63-71) 137/71 (04/20 0439) ?SpO2:  [95 %-97 %] 97 % (04/20 0439) ? ?Weight change:  ?Filed Weights  ? 12/14/21 0039 12/17/21 0908 12/17/21 1220  ?Weight: 71.2 kg 77.7 kg 76.5 kg  ? ? ?Intake/Output: ?I/O last 3 completed shifts: ?In: 240 [P.O.:240] ?Out: 1500 [Other:1500] ?  ?Intake/Output this shift: ? No intake/output data recorded. ? ?Physical Exam: ?General: NAD, resting in bed  ?Head: Normocephalic, atraumatic. Moist oral mucosal membranes  ?Eyes: Anicteric  ?Lungs:  Clear to auscultation, normal effort, room air  ?Heart: Regular rate and rhythm  ?Abdomen:  Soft, nontender, nondistended  ?Extremities: trace peripheral edema.  ?Neurologic: Nonfocal, moving all four extremities  ?Skin: No lesions  ?Access: Right PermCath  ? ? ?Basic Metabolic Panel: ?Recent Labs  ?Lab 12/14/21 ?0031 12/15/21 ?0227 12/16/21 ?3295 12/17/21 ?1884 12/18/21 ?0543  ?NA 139 134* 136 133* 132*  ?K 5.1 7.1* 5.1 5.8* 5.1  ?CL 95* 95* 96*  95* 96*  ?CO2 26 22 25 23 26   ?GLUCOSE 206* 191* 193* 170* 165*  ?BUN 47* 67* 34* 52* 35*  ?CREATININE 5.40* 6.84* 4.06* 5.72* 3.88*  ?CALCIUM 9.5 8.5* 9.0 8.9 9.0  ?MG  --  2.3 1.9  --   --   ?PHOS  --  8.1* 7.4* 7.8* 7.2*  ? ? ? ?Liver Function Tests: ?Recent Labs  ?Lab 12/15/21 ?0227 12/16/21 ?1660 12/17/21 ?6301 12/18/21 ?0543  ?ALBUMIN 3.4* 3.3* 3.1* 3.1*  ? ? ?No results for input(s): LIPASE, AMYLASE in the last 168 hours. ?No results for input(s): AMMONIA in the last 168 hours. ? ?CBC: ?Recent Labs  ?Lab 12/14/21 ?0031 12/15/21 ?0227 12/16/21 ?6010  ?WBC 6.9 9.1 6.9  ?NEUTROABS 5.1 7.7 5.6  ?HGB 8.8* 6.7* 7.3*  ?HCT 28.1* 21.3* 22.9*  ?MCV 102.2* 101.9* 99.1  ?PLT 223 189 158  ? ? ? ?Cardiac Enzymes: ?No results for input(s): CKTOTAL, CKMB, CKMBINDEX, TROPONINI in the last 168 hours. ? ?BNP: ?Invalid input(s): POCBNP ? ?CBG: ?Recent Labs  ?Lab 12/17/21 ?1253 12/17/21 ?1605 12/17/21 ?1713 12/17/21 ?2126 12/18/21 ?0801  ?GLUCAP 116* 253* 236* 193* 137*  ? ? ? ?Microbiology: ?Results for orders placed or performed during the hospital encounter of 12/14/21  ?Surgical pcr screen     Status: None  ? Collection Time: 12/14/21 12:17 AM  ? Specimen: Nasal Mucosa; Nasal Swab  ?Result Value Ref Range Status  ? MRSA, PCR NEGATIVE NEGATIVE Final  ? Staphylococcus aureus NEGATIVE NEGATIVE Final  ?  Comment: (NOTE) ?The Xpert SA Assay (FDA approved for NASAL specimens in patients 22 ?  years of age and older), is one component of a comprehensive ?surveillance program. It is not intended to diagnose infection nor to ?guide or monitor treatment. ?Performed at [facility], [place], ?[place] [address] ?  ? ? ?Coagulation Studies: ?No results for input(s): LABPROT, INR in the last 72 hours. ? ? ?Urinalysis: ?No results for input(s): COLORURINE, LABSPEC, [place], GLUCOSEU, HGBUR, BILIRUBINUR, KETONESUR, PROTEINUR, UROBILINOGEN, NITRITE, LEUKOCYTESUR in the last 72 hours. ? ?Invalid input(s): APPERANCEUR   ? ? ?Imaging: ?No results found. ? ? ?Medications:  ? ? tranexamic acid    ? ? sodium chloride   Intravenous Once  ? acetaminophen  1,000 mg Oral Q8H  ? Chlorhexidine Gluconate Cloth  6 each Topical Q0600  ? docusate sodium  100 mg Oral BID  ? heparin injection (subcutaneous)  5,000 Units Subcutaneous Q8H  ? insulin aspart  0-5 Units Subcutaneous QHS  ? insulin aspart  0-9 Units Subcutaneous TID WC  ? insulin glargine-yfgn  10 Units Subcutaneous Daily  ? losartan  50 mg Oral Daily  ? polyethylene glycol  17 g Oral Daily  ? senna-docusate  1 tablet Oral BID  ? ?bisacodyl, menthol-cetylpyridinium **OR** phenol, methocarbamol, metoCLOPramide **OR** metoCLOPramide (REGLAN) injection, ondansetron **OR** ondansetron (ZOFRAN) IV, oxyCODONE **OR** [DISCONTINUED] HYDROmorphone, senna-docusate, traMADol ? ?Assessment/ Plan:  ?[name] is a 56 y.o.  female with a past medical history of hyperparathyroidism, anemia, osteoporosis, hypertension, type 2 diabetes, and end-stage renal disease on hemodialysis.  Patient presents to the emergency department complaining of right hip pain while ambulating with crutches.  Patient has been admitted for Closed right hip fracture, initial encounter ([place]) [S72.001A] ?Fracture of proximal end of right femur, closed, initial encounter ([place]) [S72.001A] ? ?[facility]/MWF/right PermCath ? ?End-stage renal disease on hemodialysis.  Will maintain outpatient schedule if possible.  Dialysis received yesterday, UF 1.5L removed. Next treatment scheduled for Friday. Patient will discharge to [facility] for rehab.  ? ?2. Anemia of chronic kidney disease ?Lab Results  ?Component Value Date  ? HGB 7.3 (L) 12/16/2021  ? Aranesp weekly outpatient ?Patient received 1 unit blood transfusion during admission.  ? ?3. Secondary Hyperparathyroidism:  ?Lab Results  ?Component Value Date  ? CALCIUM 9.0 12/18/2021  ? CAION 1.16 10/16/2021  ? PHOS 7.2 (H) 12/18/2021  ?Will monitor bone minerals  during this admission. No binders prescribed at this time ? ?4. Diabetes mellitus type II with chronic kidney disease insulin dependent. Home regimen includes Novolog and Lantus. Most recent hemoglobin A1c is 6.2 on 09/29/21.  ? Primary team to manage SSI ? ?5. Closed right hip fracture. Appreciate orthopedics performing right hip hemiarthroplasty on 12/14/21. ? Pain management and PT/OT.  Will discharge to [facility] for rehab. ? ? LOS: 4 ?[place] ?4/20/202312:36 PM ?  ?

## 2021-12-18 NOTE — Plan of Care (Signed)

## 2021-12-29 ENCOUNTER — Other Ambulatory Visit: Payer: Self-pay

## 2021-12-29 ENCOUNTER — Emergency Department: Payer: Medicare Other

## 2021-12-29 ENCOUNTER — Emergency Department
Admission: EM | Admit: 2021-12-29 | Discharge: 2021-12-29 | Disposition: A | Discharge status: Home / Self Care | Payer: Medicare Other | Attending: Emergency Medicine | Admitting: Emergency Medicine

## 2021-12-29 DIAGNOSIS — Z992 Dependence on renal dialysis: Secondary | ICD-10-CM | POA: Insufficient documentation

## 2021-12-29 DIAGNOSIS — M79601 Pain in right arm: Secondary | ICD-10-CM | POA: Insufficient documentation

## 2021-12-29 DIAGNOSIS — Z96641 Presence of right artificial hip joint: Secondary | ICD-10-CM | POA: Diagnosis not present

## 2021-12-29 DIAGNOSIS — L905 Scar conditions and fibrosis of skin: Secondary | ICD-10-CM | POA: Diagnosis not present

## 2021-12-29 DIAGNOSIS — I129 Hypertensive chronic kidney disease with stage 1 through stage 4 chronic kidney disease, or unspecified chronic kidney disease: Secondary | ICD-10-CM | POA: Insufficient documentation

## 2021-12-29 DIAGNOSIS — M79604 Pain in right leg: Secondary | ICD-10-CM | POA: Diagnosis not present

## 2021-12-29 DIAGNOSIS — N189 Chronic kidney disease, unspecified: Secondary | ICD-10-CM | POA: Diagnosis not present

## 2021-12-29 LAB — CBC WITH DIFFERENTIAL/PLATELET
Abs Immature Granulocytes: 0.09 10*3/uL — ABNORMAL HIGH (ref 0.00–0.07)
Basophils Absolute: 0.1 10*3/uL (ref 0.0–0.1)
Basophils Relative: 1 %
Eosinophils Absolute: 0.1 10*3/uL (ref 0.0–0.5)
Eosinophils Relative: 2 %
HCT: 24.5 % — ABNORMAL LOW (ref 36.0–46.0)
Hemoglobin: 7.4 g/dL — ABNORMAL LOW (ref 12.0–15.0)
Immature Granulocytes: 1 %
Lymphocytes Relative: 9 %
Lymphs Abs: 0.6 10*3/uL — ABNORMAL LOW (ref 0.7–4.0)
MCH: 30.6 pg (ref 26.0–34.0)
MCHC: 30.2 g/dL (ref 30.0–36.0)
MCV: 101.2 fL — ABNORMAL HIGH (ref 80.0–100.0)
Monocytes Absolute: 0.4 10*3/uL (ref 0.1–1.0)
Monocytes Relative: 5 %
Neutro Abs: 5.6 10*3/uL (ref 1.7–7.7)
Neutrophils Relative %: 82 %
Platelets: 252 10*3/uL (ref 150–400)
RBC: 2.42 MIL/uL — ABNORMAL LOW (ref 3.87–5.11)
RDW: 16.5 % — ABNORMAL HIGH (ref 11.5–15.5)
WBC: 6.9 10*3/uL (ref 4.0–10.5)
nRBC: 0 % (ref 0.0–0.2)

## 2021-12-29 LAB — BASIC METABOLIC PANEL
Anion gap: 22 — ABNORMAL HIGH (ref 5–15)
BUN: 24 mg/dL — ABNORMAL HIGH (ref 6–20)
CO2: 26 mmol/L (ref 22–32)
Calcium: 9.7 mg/dL (ref 8.9–10.3)
Chloride: 89 mmol/L — ABNORMAL LOW (ref 98–111)
Creatinine, Ser: 3.67 mg/dL — ABNORMAL HIGH (ref 0.44–1.00)
GFR, Estimated: 14 mL/min — ABNORMAL LOW (ref >60)
Glucose, Bld: 136 mg/dL — ABNORMAL HIGH (ref 70–99)
Potassium: 4.3 mmol/L (ref 3.5–5.1)
Sodium: 137 mmol/L (ref 135–145)

## 2021-12-29 IMAGING — CR DG HUMERUS 2V *R*
1 series · 2 of 2 positions shown · non-contrast
Comparison: None.

CLINICAL DATA: Right arm pain.

EXAM:
RIGHT HUMERUS - 2+ VIEW

[Series 1: dg humerus right · 0.14mm/px · 2 of 2 slices shown]
[im 1/2]
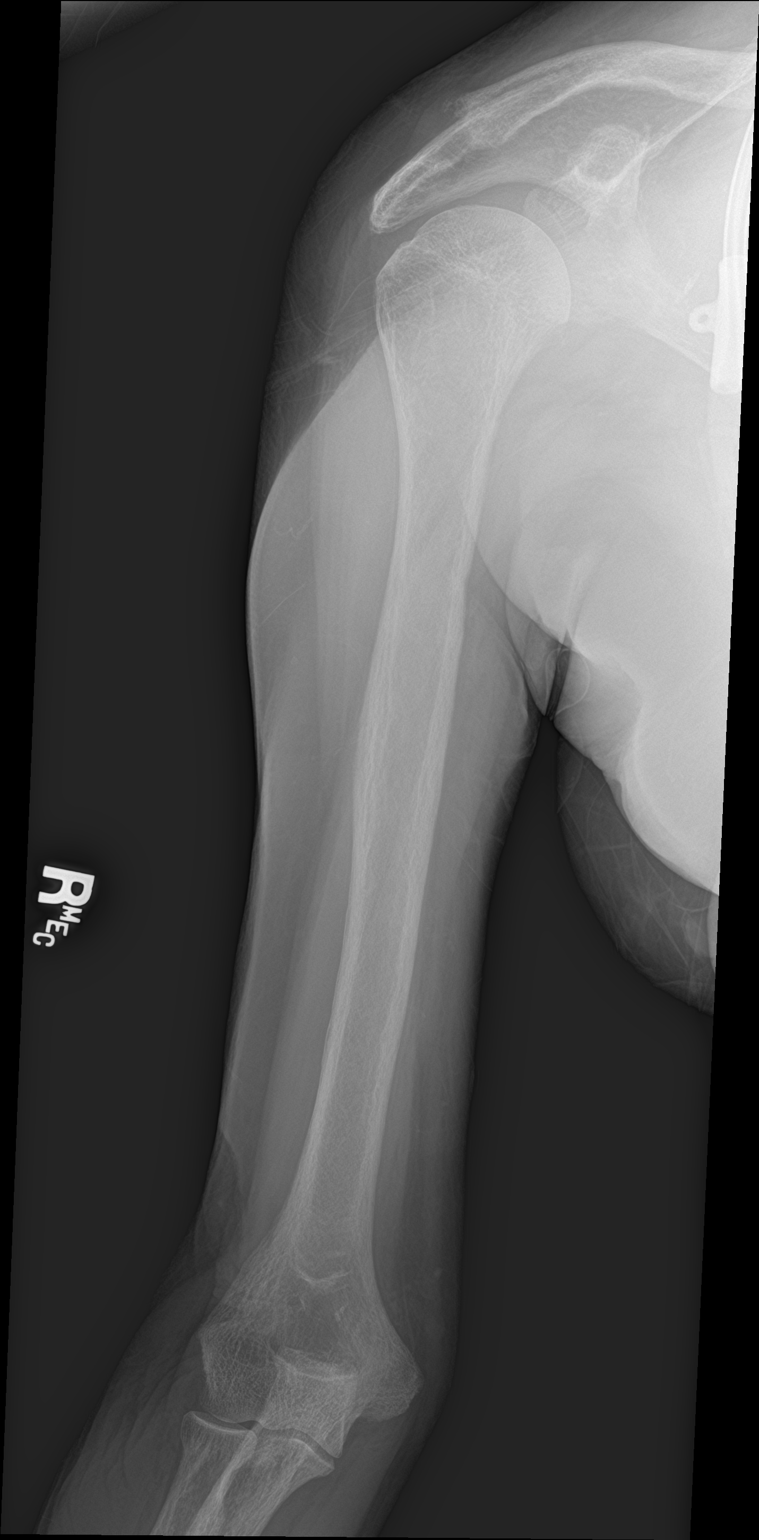
[im 2/2]
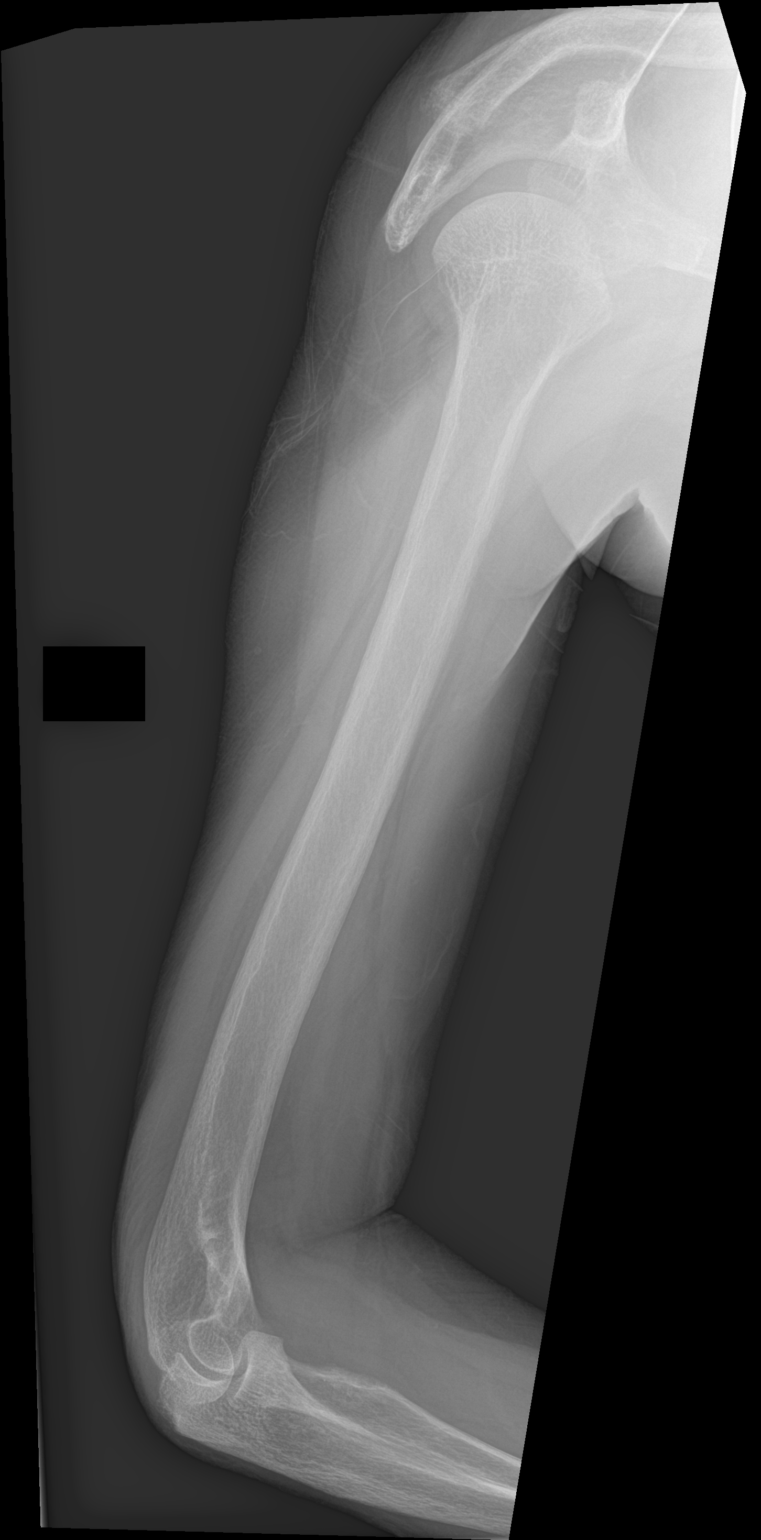

[2 of 2 positions shown; findings below may reference images not displayed]

FINDINGS: There is no evidence of fracture or other focal bone lesions. Soft
tissues are unremarkable.
IMPRESSION: Negative.

## 2021-12-29 IMAGING — CR DG FEMUR 2+V*R*
1 series · 4 of 4 positions shown · non-contrast
Comparison: Right hip x-rays dated [DATE].

CLINICAL DATA: Right hip pain. Recent right hip replacement for
fracture.

EXAM:
RIGHT FEMUR 2 VIEWS

[Series 1: dg femur, min 2 views right · 0.14mm/px · 4 of 4 slices shown]
[im 1/4]
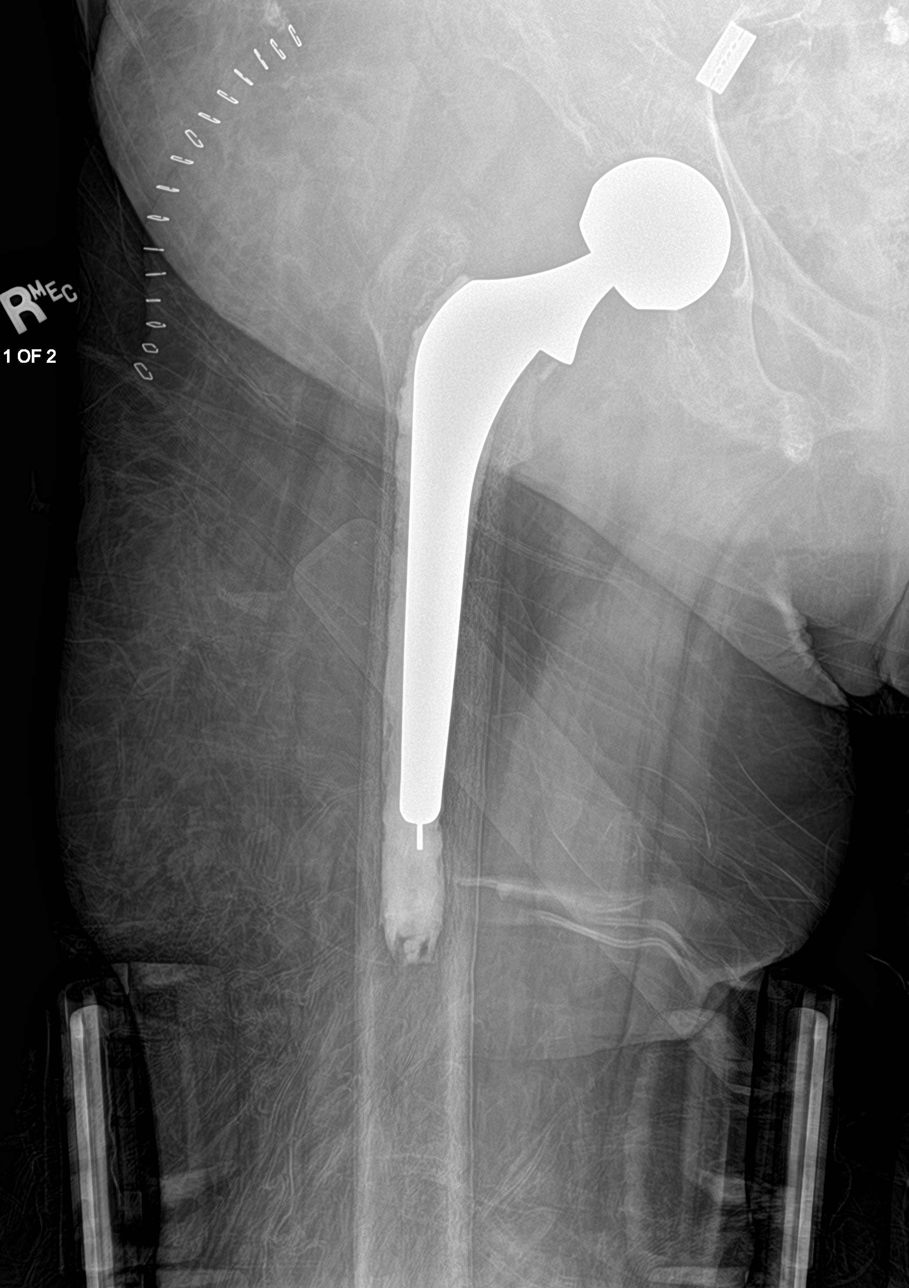
[im 2/4]
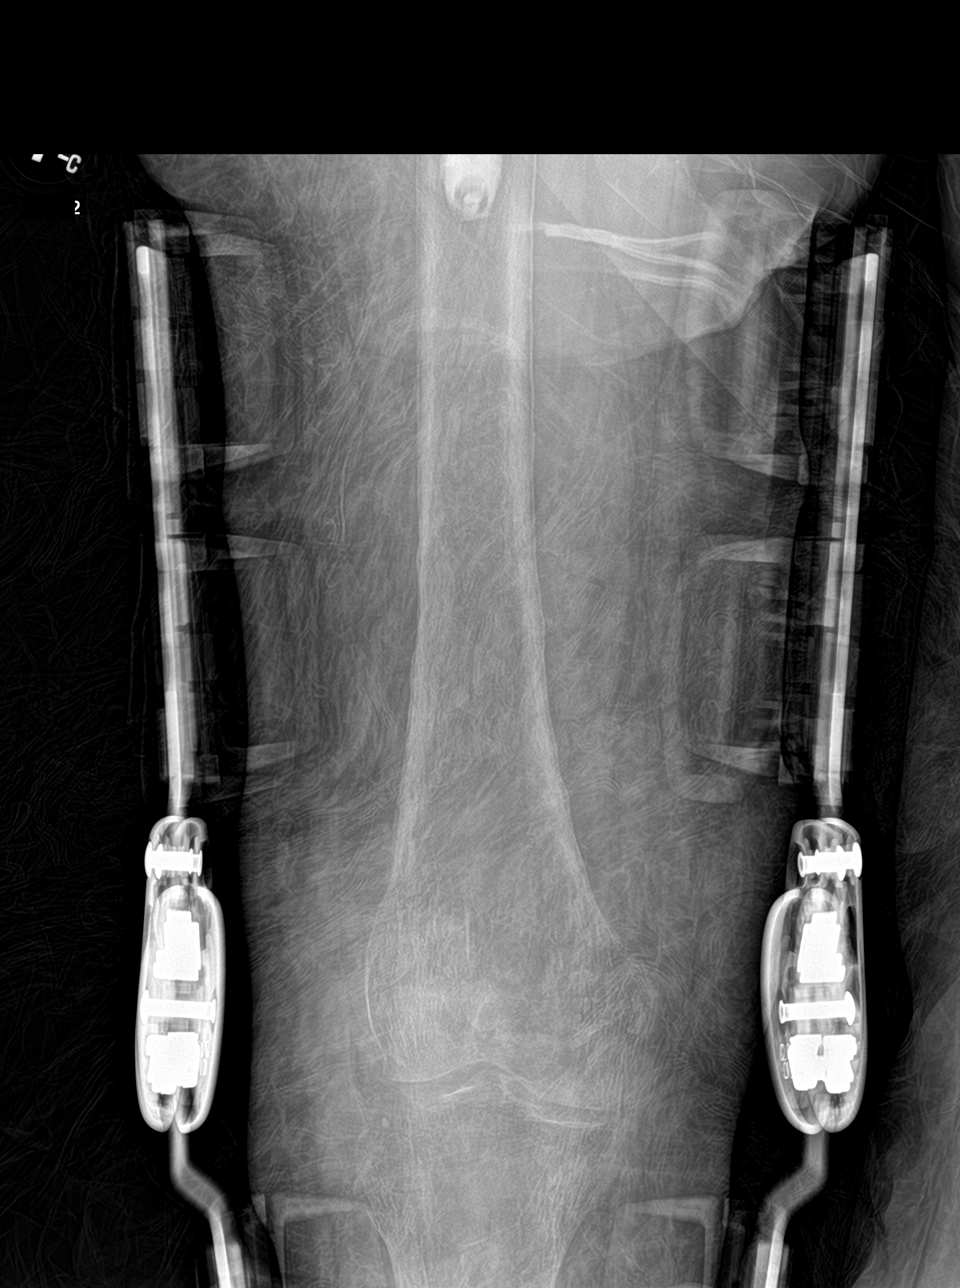
[im 3/4]
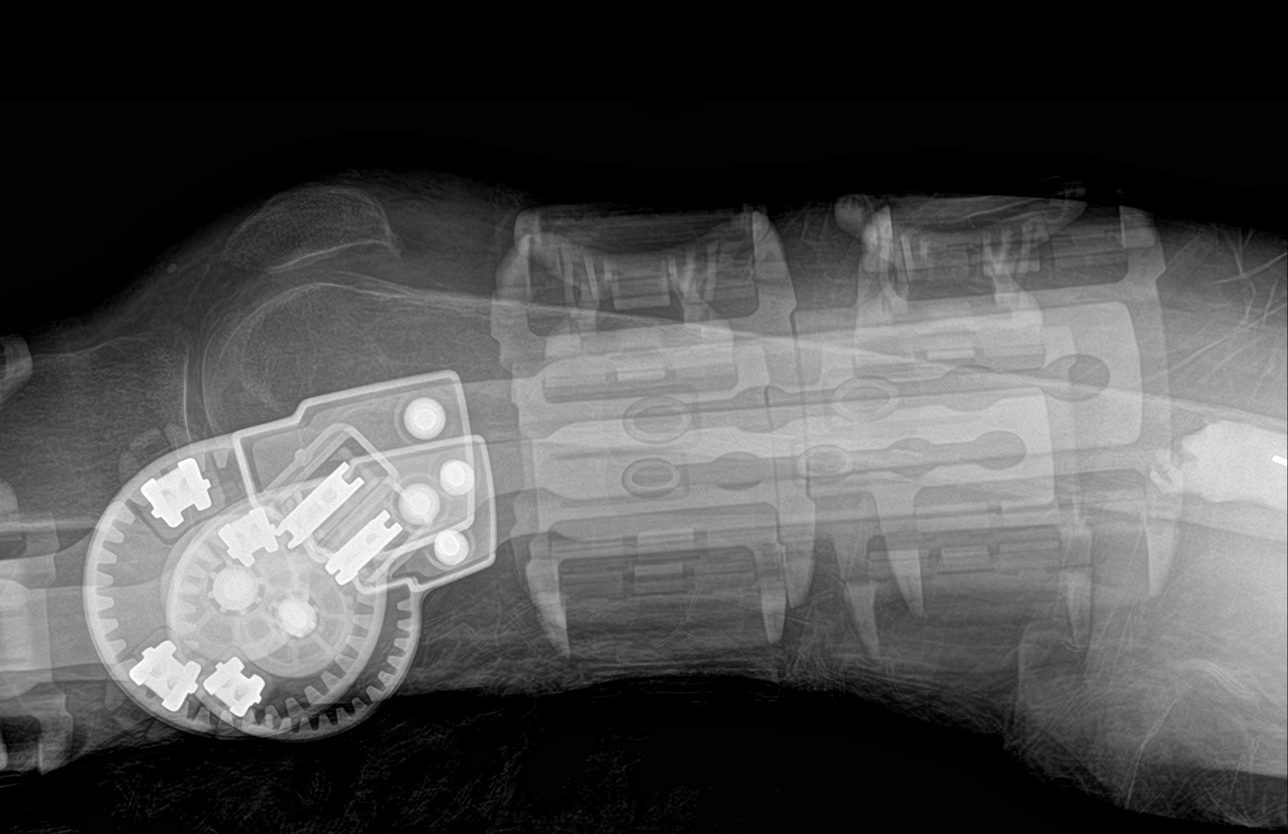
[im 4/4]
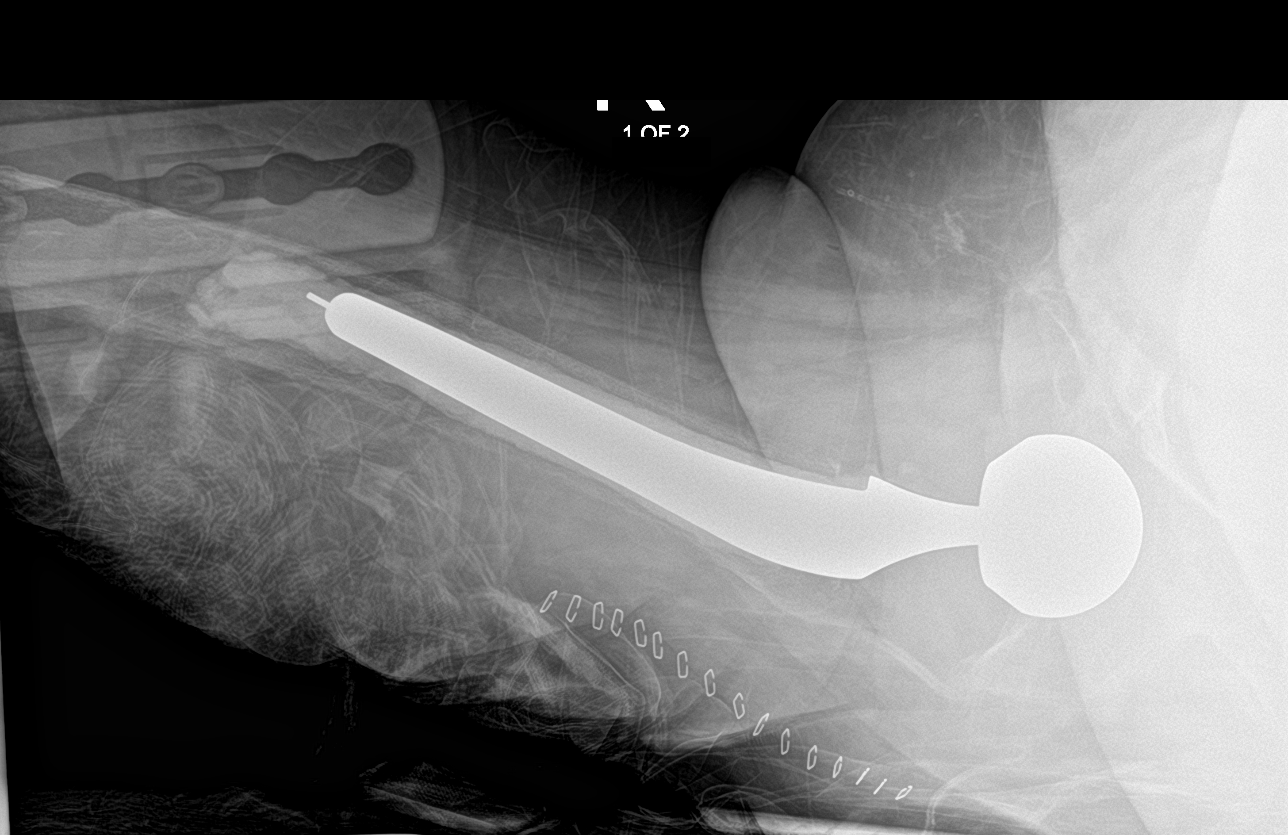

[4 of 4 positions shown; findings below may reference images not displayed]

FINDINGS: Prior right hip hemiarthroplasty. No evidence of hardware failure or
loosening. No acute fracture or dislocation. Old healed fracture of
the right inferior pubic ramus. Unchanged severe osteopenia.
IMPRESSION: 1. No acute osseous abnormality. Prior right hip hemiarthroplasty
without hardware complication.

## 2021-12-29 MED ORDER — HYDROMORPHONE HCL 1 MG/ML IJ SOLN
1.0000 mg | Freq: Once | INTRAMUSCULAR | Status: AC
  Administered 2021-12-29: 1 mg via INTRAMUSCULAR
  Filled 2021-12-29: qty 1

## 2021-12-29 MED ORDER — TRAMADOL HCL 50 MG PO TABS: 50.0000 mg | ORAL_TABLET | Freq: Three times a day (TID) | ORAL | 0 refills | Status: DC | PRN

## 2021-12-29 MED ORDER — CYCLOBENZAPRINE HCL 5 MG PO TABS: 5.0000 mg | ORAL_TABLET | Freq: Three times a day (TID) | ORAL | 0 refills | Status: DC | PRN

## 2021-12-29 MED ORDER — TRAMADOL HCL 50 MG PO TABS: 50.0000 mg | ORAL_TABLET | Freq: Three times a day (TID) | ORAL | 0 refills | Status: AC | PRN

## 2021-12-29 MED ORDER — KETOROLAC TROMETHAMINE 30 MG/ML IJ SOLN
30.0000 mg | Freq: Once | INTRAMUSCULAR | Status: AC
  Administered 2021-12-29: 30 mg via INTRAVENOUS
  Filled 2021-12-29: qty 1

## 2021-12-29 MED ORDER — CYCLOBENZAPRINE HCL 5 MG PO TABS: 5.0000 mg | ORAL_TABLET | Freq: Three times a day (TID) | ORAL | 0 refills | Status: AC | PRN

## 2021-12-29 MED ORDER — ORPHENADRINE CITRATE 30 MG/ML IJ SOLN
60.0000 mg | INTRAMUSCULAR | Status: AC
Start: 2021-12-29 — End: 2021-12-29
  Administered 2021-12-29: 60 mg via INTRAVENOUS
  Filled 2021-12-29: qty 2

## 2021-12-29 NOTE — Discharge Instructions (Signed)
Your exam and XRs are normal and without evidence of acute fracture or dislocation. Take the prescription meds as directed. You should contact your primary provider or Ortho provider to establish any home healthcare needs. You will be contacted by a member of our care team about DME and skilled nursing facility needs. Return to the ED if needed.  ?

## 2021-12-29 NOTE — ED Provider Notes (Signed)
? ? ?[facility] ?Emergency Department Provider Note ? ? ? ? Event Date/Time  ? First MD Initiated Contact with Patient 12/29/21 1517   ?  (approximate) ? ? ?History  ? ?Hip Pain and Shoulder Pain ? ? ?HPI ? ?[name] is a 56 y.o. female with HTN, CKD on dialysis presents to the ED after completing dialysis, with complaints of right leg pain. Patient is 2 weeks s/p right THR following closed fracture. She is NWB on the right leg until next week. She has a history of left [facility] January 2023. She denies falls, but notes acute pain to the right lateral thigh after her husband attempted to disconnect the wound vacuum from the hose and unit.  He apparently hit her leg inadvertently when the hose disconnected.  She noted pain after transferring to the dialysis chair.  She would also endorse pain to the right arm and left leg.  Patient was admitted to [facility] for rehab following her hip surgery.  She apparently discharged herself and left AMA from facility after less than a week.  She has been home with her husband is her primary caregiver.  She has not made contact with her orthopedic provider to reestablish care, but has noted that she has home PT scheduled.  Patient also reports she has no prescription medicines at home to take for pain citing that she was not given any medication when she left [facility].  She denies any bladder or bowel incontinence, foot drop, or saddle anesthesia. ? ? ?Physical Exam  ? ?Triage Vital Signs: ?ED Triage Vitals  ?Enc Vitals Group  ?   BP 12/29/21 1458 (!) 170/69  ?   Pulse Rate 12/29/21 1458 91  ?   Resp 12/29/21 1458 20  ?   Temp 12/29/21 1458 98.1 ?F (36.7 ?C)  ?   Temp Source 12/29/21 1458 Oral  ?   SpO2 12/29/21 1458 97 %  ?   Weight 12/29/21 1458 157 lb (71.2 kg)  ?   Height 12/29/21 1458 5\' 7"  (1.702 m)  ?   Head Circumference --   ?   [place] --   ?   Pain Score 12/29/21 1504 10  ?   Pain Loc --   ?   Pain Edu? --   ?   Excl. in [place]? --    ? ? ?Most recent vital signs: ?Vitals:  ? 12/29/21 1817 12/29/21 2203  ?BP: (!) 170/86 (!) 160/74  ?Pulse: 96 86  ?Resp: 18 18  ?Temp:  98 ?F (36.7 ?C)  ?SpO2: 100% 100%  ? ? ?General Awake, no distress.  ?CV:  Good peripheral perfusion.  ?RESP:  Normal effort. CTA ?ABD:  No distention. Soft, nontender ?MSK:  Right lower extremity with a hinged knee brace in place locked in the extended position.  No peripheral edema noted.  Anterior surgical scar to the right knee without signs of erythema or induration. ? ? ?ED Results / Procedures / Treatments  ? ?Labs ?(all labs ordered are listed, but only abnormal results are displayed) ?Labs Reviewed  ?CBC WITH DIFFERENTIAL/PLATELET - Abnormal; Notable for the following components:  ?    Result Value  ? RBC 2.42 (*)   ? Hemoglobin 7.4 (*)   ? HCT 24.5 (*)   ? MCV 101.2 (*)   ? RDW 16.5 (*)   ? Lymphs Abs 0.6 (*)   ? Abs Immature Granulocytes 0.09 (*)   ? All other components within normal  limits  ?BASIC METABOLIC PANEL - Abnormal; Notable for the following components:  ? Chloride 89 (*)   ? Glucose, Bld 136 (*)   ? BUN 24 (*)   ? Creatinine, Ser 3.67 (*)   ? GFR, Estimated 14 (*)   ? Anion gap 22 (*)   ? All other components within normal limits  ? ? ? ?EKG ? ? ?RADIOLOGY ? ?I personally viewed and evaluated these images as part of my medical decision making, as well as reviewing the written report by the radiologist. ? ?ED Provider Interpretation: no acute fractures} ? ?DG Humerus Right ? ?Result Date: 12/29/2021 ?CLINICAL DATA:  Right arm pain. EXAM: RIGHT HUMERUS - 2+ VIEW COMPARISON:  None. FINDINGS: There is no evidence of fracture or other focal bone lesions. Soft tissues are unremarkable. IMPRESSION: Negative. Electronically Signed   By: Titus Dubin M.D.   On: 12/29/2021 17:07  ? ?DG Femur Min 2 Views Right ? ?Result Date: 12/29/2021 ?CLINICAL DATA:  Right hip pain. Recent right hip replacement for fracture. EXAM: RIGHT FEMUR 2 VIEWS COMPARISON:  Right hip x-rays  dated December 14, 2021. FINDINGS: Prior right hip hemiarthroplasty. No evidence of hardware failure or loosening. No acute fracture or dislocation. Old healed fracture of the right inferior pubic ramus. Unchanged severe osteopenia. IMPRESSION: 1. No acute osseous abnormality. Prior right hip hemiarthroplasty without hardware complication. Electronically Signed   By: Titus Dubin M.D.   On: 12/29/2021 17:10   ? ? ?PROCEDURES: ? ?Critical Care performed: No ? ?Procedures ? ? ?MEDICATIONS ORDERED IN ED: ?Medications  ?HYDROmorphone (DILAUDID) injection 1 mg (1 mg Intramuscular Given 12/29/21 1606)  ?ketorolac (TORADOL) 30 MG/ML injection 30 mg (30 mg Intravenous Given 12/29/21 1810)  ?orphenadrine (NORFLEX) injection 60 mg (60 mg Intravenous Given 12/29/21 1810)  ? ? ? ?IMPRESSION / MDM / ASSESSMENT AND PLAN / ED COURSE  ?I reviewed the triage vital signs and the nursing notes. ?             ?               ? ?Differential diagnosis includes, but is not limited to, acute on chronic pain, thigh contusion, femur fracture, hardware disruption, occult fracture to the humerus, musculoskeletal pain, muscle spasms ? ?Patient to the ED for evaluation of acute pain to the right thigh and right upper arm.  Patient with a history of low calcium presents for concern for possible fractures.  Patient's diagnosis is consistent with musculoskeletal pain and acute on chronic pain.  Patient with concern for her ability to manage her pain at home, initially requesting admission for pain management.  I told the patient that I would work diligently to help manage her pain and we would reevaluate her need for admission after an interim check.  Patient was found to be resting comfortably in the room but still voiced concerns for mobility and transferring without pain.  Ultimately patient decided that she would discharge home.  I offered nonemergent transportation by EMS as well as options for observation status to help set up DME and social work  if necessary.  Patient declined, but was discharged with prescriptions which were confirmed to be available at the 24-hour pharmacy in [place].  Patient will be discharged home with prescriptions for Flexeril and Ultram. Patient is to follow up with her primary providers at Ortho as needed or otherwise directed. Patient is given ED precautions to return to the ED for any worsening or new symptoms. ? ?FINAL  CLINICAL IMPRESSION(S) / ED DIAGNOSES  ? ?Final diagnoses:  ?Right leg pain  ?Right arm pain  ? ? ? ?Rx / DC Orders  ? ?ED Discharge Orders   ? ?      Ordered  ?  cyclobenzaprine (FLEXERIL) 5 MG tablet  3 times daily PRN,   Status:  Discontinued       ? 12/29/21 1906  ?  traMADol (ULTRAM) 50 MG tablet  3 times daily PRN,   Status:  Discontinued       ? 12/29/21 1906  ?  cyclobenzaprine (FLEXERIL) 5 MG tablet  3 times daily PRN       ? 12/29/21 2121  ?  traMADol (ULTRAM) 50 MG tablet  3 times daily PRN       ? 12/29/21 2121  ? ?  ?  ? ?  ? ? ? ?Note:  This document was prepared using Dragon voice recognition software and may include unintentional dictation errors. ? ?  ?Rustin Erhart, Dannielle Karvonen, PA-C ?12/29/21 2352 ? ?  ?Lucrezia Starch, MD ?12/30/21 0115 ? ?

## 2021-12-29 NOTE — ED Triage Notes (Signed)
First Nurse Note:  ?Pt via EMS from dialysis. Pt states that she R hip pain, pt has a hx of bilateral hip replacement in the past year. They did finish her dialysis treatment. Pt is also c/o L leg, R arm pain. States when she was getting up to transferring to the dialysis chair it hurt even worse. Pt is A&Ox4 and NAD ? ?172/80 ?96 HR  ?96 on RA ?

## 2021-12-29 NOTE — ED Triage Notes (Signed)
Pt to ED via [facility] from dialysis. Pt reports pain in right leg with recent hip replacement. Pt reports she did finish her dialysis. Pt also report arm pain. Pt reports she is not allowed to walk until her next surgery May 10th at [facility].  ? ?Pt reports she took robaxin and tylenol at 9am this morning.  ?

## 2021-12-29 NOTE — ED Notes (Signed)
E-signature not working at this time. Pt verbalized understanding of D/C instructions, prescriptions and follow up care with no further questions at this time. Pt in NAD and wheeled to lobby by family at time of D/C. ? ?

## 2022-01-02 ENCOUNTER — Telehealth: Payer: Self-pay | Admitting: *Deleted

## 2022-01-02 NOTE — Telephone Encounter (Signed)
Amy PT called for ok for 1wk1 resumption of care and to recert. She also asked for any restrictions, but I have let her know we were not involved with the last hospitalization so she would have to call the surgeon for any restrictions. Ok'd the resumption since she will be following up her on 01/13/22 with Dr Ranell Patrick. ?

## 2022-01-08 NOTE — Discharge Summary (Signed)
 ------------------------------------------------------------------------------- Attestation signed by [name] Elizabeth Ned, MD at 01/09/22 1429 I reviewed the patient with the resident and was available.  [name], M.D.  -------------------------------------------------------------------------------   Discharge Summary  Admit date: 01/07/2022  Discharge date and time: 01/09/2022 midday  Discharge to:  Home  Discharge Service: [facility])  Discharge Attending Physician: Elizabeth Ned Luke, MD  Discharge  Diagnoses: secondary hyperparathyroidism  Secondary Diagnosis: Active Problems:   * No active hospital problems. * Resolved Problems:   * No resolved hospital problems. *   OR Procedures:   PARATHYROIDECTOMY OR EXPLORATION OF PARATHYROID(S) Date 01/07/2022 -------------------   Ancillary Procedures: Received HD while inpatient  Discharge Day Services: The patient was seen and examined by the Surgery Team on the day of discharge.  Vital signs and laboratory values were stable and within normal limits.  Surgical wounds were examined.  Discharge plan was discussed, instructions for home care were given, and all questions answered.   Subjective  No acute events overnight. No fever or chills. Minimal pain. Neck appropriate without evidence of hematoma. Mild hoarseness and throat soreness with swallowing. No perioral or extremity numbness/tingling.   Objective  Patient Vitals for the past 8 hrs:  BP Temp Temp src Pulse Resp SpO2  01/08/22 1300 -- 36.7 C (98.1 F) -- -- -- --  01/08/22 1145 122/59 36.4 C (97.5 F) Oral 94 20 99 %  01/08/22 1136 126/61 -- -- 91 18 --  01/08/22 1130 123/64 -- -- 92 18 --  01/08/22 1100 118/59 -- -- 91 20 --  01/08/22 1045 114/55 -- -- 89 18 --  01/08/22 1030 118/61 -- -- 88 18 --  01/08/22 1000 110/64 -- -- 89 18 --  01/08/22 0930 102/56 -- -- 72 18 --  01/08/22 0900 110/59 -- -- 84 18 --  01/08/22 0834 112/58 -- -- 83 18 --   01/08/22 0820 112/61 36.6 C (97.8 F) Oral 84 18 99 %   I/O this shift: In: -  Out: 1077 [Other:1077]  Physical Exam: General: well appearing, sitting comfortably in bed Neck: telfa in place with some strike through, no underlying hematoma, neck supple  Cardiology: regular rate, hemodynamically stable Pulmonary: breathing comfortably on room air, equal bilateral chest rise Neuro: no focal deficits  Hospital Course: [name] is a 56 y.o. female with history of HTN, DMII, ESRD on TThS HD, and secondary hyperparathyroidism  that was admitted to the hospital on 01/07/2022 for planned operation. The patient was taken to the OR on 01/07/2022 for a subtotal parathyroidectomy. Intraoperatively 3.5 glands were removed, with small amount of right inferior remaining in vivo.  She tolerated the procedure well, was extubated in the OR, and was taken to the PACU where she received routine postoperative care before being transferred to the floor.   She did well postoperatively. Her diet was slowly advanced, and at the time of discharge she was tolerating a regular diet. The patient was able to void spontaneously, have her pain controlled with P.O. pain medication, and motor function was at baseline (limited by recent hip operations). Her PTH fell appropriately and her Ca remained stable with supplementation.   She received HD while inpatient and will resume her outpatient MWF schedule.   She is being discharged on 01/08/22 (POD 2) to home in stable condition with planned outpatient follow-up.   Condition at Discharge: Improved Discharge Medications:    Medication List    START taking these medications   . calcitrioL 0.5 MCG capsule;  Commonly known as: ROCALTROL; Take 1 capsule  (0.5 mcg total) by mouth Two (2) times a day. . calcium  carbonate 200 mg calcium  (500 mg) chewable tablet; Commonly  known as: TUMS; Chew 3 tablets (600 mg of elem calcium  total) Three (3)  times a day.   CHANGE how  you take these medications   . traMADoL  50 mg tablet; Commonly known as: ULTRAM ; Take 1 tablet (50 mg  total) by mouth every six (6) hours as needed for up to 5 days.; What  changed: how much to take, when to take this, reasons to take this   CONTINUE taking these medications   . acetaminophen  325 MG tablet; Commonly known as: TYLENOL  . aspirin  81 MG tablet; Commonly known as: ECOTRIN . DEXCOM G6 SENSOR Devi; Generic drug: blood-glucose sensor . DEXCOM G6 TRANSMITTER Devi; Generic drug: blood-glucose transmitter . insulin  ASPART 100 unit/mL (3 mL) injection pen; Commonly known as:  NovoLOG  FLEXPEN . losartan  50 MG tablet; Commonly known as: COZAAR  . methocarbamoL  750 MG tablet; Commonly known as: ROBAXIN    STOP taking these medications   . ergocalciferol -1,250 mcg (50,000 unit) 1,250 mcg (50,000 unit) capsule;  Commonly known as: DRISDOL    ASK your doctor about these medications   . * BD INSULIN  SYRINGE ULTRA-FINE 1 mL 30 gauge x 1/2 (12 mm) Syrg;  Generic drug: insulin  syringe-needle U-100; USE DAILY AS NEEDED, 1-3 TIMES . * BD INSULIN  SYRINGE ULTRA-FINE 1 mL 30 gauge x 1/2 (12 mm) Syrg;  Generic drug: insulin  syringe-needle U-100; USE DAILY AS NEEDED, 1-3 TIMES . flavoring agent (bulk) Oil . ONETOUCH ULTRA TEST Strp; Generic drug: blood sugar diagnostic; TEST  BLOOD SUGARS THREE TIMES DAILY. PLEASE DISPENSE ONE TOUCH. ICD-10 E11.9 * This list has 2 medication(s) that are the same as other medications  prescribed for you. Read the directions carefully, and ask your doctor or  other care provider to review them with you.    Pending Test Results: final surgical pathology  Discharge Instructions:  Other Instructions    Discharge instructions     Discharge Instructions after Parathyroid Surgery  You will be sent home with a clear plastic bandage over a small Telfa bandage.  This should stay on for about a week.  You may bathe or shower with the bandage on.  After a week you may  remove the outer plastic dressing and the Telfa.  Underneath will be some dressing tapes.  Just leave those on for another week or until they start to come off by themselves.  You may continue to shower or bathe with these in place.  Just pat the wound dry gently. You may do any activities (within reason) that you feel like doing.  No restrictions.  If it's painful or uncomfortable, then stop.  Take your pain medicine as directed if needed.  If you typically take other pain medicine such as acetaminophen  (Tylenol ), aspirin , or ibuprofen, you are welcome to substitute it for your prescribed pain medicine.    You should be sent home on Calcium  and Calcitriol.  You should take this as prescribed.   If you have symptoms of numbness or tingling in/around your mouth or in your fingers/toes, or experience muscle cramps you should take an extra 1,000 mg of elemental calcium  (the number of tablets required will vary based on which calcium  product you are using) and continue to monitor for symptoms. If symptoms have not resolved within 1 hour, repeat dosing of calcium  and call your healthcare provider.  You should receive a follow-up appointment about two weeks after surgery.  If you use a computer we recommend that you sign up for My [facility] Chart at [email or web address]  This allows you to review many of your lab results and manage many of your healthcare needs including appointments.  It also makes communication with your doctors very easy and efficient.  Appointments and general questions:  [phone] or toll free [phone].  Your first point of contact for medical questions and any other issues (insurance forms, scheduling of tests) other than appointment scheduling:  Anner Favor, RN, BSN [phone] (business hours) [phone] (fax) [email or web address]  Your second point of contact for medical issues only:  Izetta Meckel, RN, MSN, NP. [phone] (business  hours) [email or web address] Izetta Quinn, RN, BSN, ANP.  [phone] (pager) [phone] (office)  To reach Dr. Luke:  Email (preferred):  [email or web address]   Not completely secure.  Do not send information you consider sensitive via email  My [facility] Chart messaging (secure).  Checked two or three times per week. [phone] (appointments) [phone] (office).  No medical advice or appointment scheduling is available at this number, but they can reach Dr. Luke. [phone] (cell phone).  Emergencies only please.     Labs and Other Follow-ups after Discharge:   Future Appointments: Appointments which have been scheduled for you   Jan 27, 2022  2:15 PM (Arrive by 2:00 PM) [facility] POST OP with Elizabeth Debby Luke, MD [facility] SURGICAL ONCOLOGY [facility]) [address] [place] [address] [phone]

## 2022-01-13 ENCOUNTER — Encounter
Payer: Medicare Other | Attending: Physical Medicine and Rehabilitation | Admitting: Physical Medicine and Rehabilitation

## 2022-03-25 ENCOUNTER — Other Ambulatory Visit
Admission: RE | Admit: 2022-03-25 | Discharge: 2022-03-25 | Disposition: A | Discharge status: Home / Self Care | Payer: Medicare Other | Source: Ambulatory Visit | Attending: Nephrology | Admitting: Nephrology

## 2022-03-25 DIAGNOSIS — Z992 Dependence on renal dialysis: Secondary | ICD-10-CM | POA: Insufficient documentation

## 2022-03-25 DIAGNOSIS — N186 End stage renal disease: Secondary | ICD-10-CM | POA: Insufficient documentation

## 2022-03-25 LAB — CALCIUM: Calcium: 9.6 mg/dL (ref 8.9–10.3)

## 2022-03-27 NOTE — Progress Notes (Signed)
 Subjective:    Chief Complaint  Patient presents with  . bed sore    Complaining of bed sore x couple for months on left lower leg. Tried double antibiotic on the area 07/27 and it seemed to make it worse. Area has gotten bigger. Patient denies any drainage for the area of concern. Pain 0/10    History was provided by the patient and old records. [name] is a 56 y.o., female  who presents with complaints of nonhealing ulcer on her left lateral calf for 2 months.  Patient reports multiple surgeries in the last several months, unsure how this wound developed?  Has no pressure sores elsewhere.  Has not been evaluated by physician previously.  Apparently has wound care referral pending.  Believes current on tetanus.  Reports yesterday had much more erythema, and tenderness surrounding wound.  No purulence.  No complaints of headache or dizziness; no chest pain or shortness breath; no nausea, vomiting, NERA; no fevers or chills.  Unaware of any bites or injuries.  Patient is on hemodialysis Monday Wednesday Friday. Symptoms include: above   Patient denies: above Treatment to date: above   Past Medical History:  Diagnosis Date  . Anemia   . Anxiety   . GERD (gastroesophageal reflux disease)   . High serum parathyroid hormone (PTH)    checked through Dialysis  . History of kidney stones 2000  . Hypertension   . kidney failure   . Neuromuscular disorder (CMS-HCC)    neuropathy in feet  . PONV (postoperative nausea and vomiting)    severe nausea requiring many doses of post op antiemetics  . Type 2 diabetes mellitus (CMS-HCC)    The following portions of the patient's history were reviewed and updated as appropriate: allergies, current medications, past social history, and problem list.  Review of Systems A complete review of systems was performed.  Positive and pertinent negative responses are documented in the HPI, and all other systems are negative.   Objective:     Vitals:    03/27/22 1814  BP: (!) 167/85  Pulse: 86  Temp: 36.9 C (98.4 F)  SpO2: 99%  Weight: 74.5 kg (164 lb 3.9 oz)  Height: 170.2 cm (5' 7)    General Appearance:  Well-developed, well-nourished.  Pleasant and cooperative.  No acute distress.  Seated in wheelchair.  Here with husband. HEENT:  [place]/AT, PERRLA, EOMI, sclera clear, conjunctivae pink.   Neck:  Supple, no JVD or adenopathy. Cardiovascular:  Regular rate and rhythm.  Lungs:  Clear to auscultation.  Abdomen: Obese, benign. Extremities: Left lower extremity-with 2 x 3 cm eschar left lateral calf, with 2 to 3 cm surrounding erythema.  No fluctuance.  Mildly tender.  No purulence. Neuro:  Alert and orient x4; nonfocal.    Lab/X-ray/Treatments:   CBC (requested by orthopedics)-5.0K, 69% neutrophils, 60% lymphocytes; H&H 10.8/33.3 respectively.       Assessment:      1. Leg ulcer, left, with unspecified severity (CMS-HCC) - CBC w/auto Differential (5 Part) - doxycycline  (VIBRAMYCIN ) 100 MG capsule; Take 1 capsule (100 mg total) by mouth 2 (two) times daily for 14 days  Dispense: 28 capsule; Refill: 0 - mupirocin (BACTROBAN) 2 % ointment; Apply topically 2 (two) times daily for 10 days  Dispense: 22 g; Refill: 1 - Ambulatory Referral to Wound Management  2. Cellulitis of leg, left - CBC w/auto Differential (5 Part) - doxycycline  (VIBRAMYCIN ) 100 MG capsule; Take 1 capsule (100 mg total) by mouth 2 (two) times  daily for 14 days  Dispense: 28 capsule; Refill: 0 - Ambulatory Referral to Wound Management  3. End stage renal disease on dialysis (CMS-HCC)  Doxycycline  twice daily for 2 weeks, Bactroban twice daily, local wound care.  I do not see pending referral to wound care center in EMR-we will place 1 now to ensure this happens ASAP.  Call return if any further problems or concerns.    Plan:   Requested Prescriptions   Signed Prescriptions Disp Refills  . doxycycline  (VIBRAMYCIN ) 100 MG capsule 28 capsule 0    Sig: Take  1 capsule (100 mg total) by mouth 2 (two) times daily for 14 days  . mupirocin (BACTROBAN) 2 % ointment 22 g 1    Sig: Apply topically 2 (two) times daily for 10 days

## 2022-04-14 ENCOUNTER — Encounter: Payer: Medicare Other | Attending: Physician Assistant | Admitting: Physician Assistant

## 2022-04-14 DIAGNOSIS — E1151 Type 2 diabetes mellitus with diabetic peripheral angiopathy without gangrene: Secondary | ICD-10-CM | POA: Insufficient documentation

## 2022-04-14 DIAGNOSIS — N186 End stage renal disease: Secondary | ICD-10-CM | POA: Diagnosis not present

## 2022-04-14 DIAGNOSIS — E11622 Type 2 diabetes mellitus with other skin ulcer: Secondary | ICD-10-CM | POA: Insufficient documentation

## 2022-04-14 DIAGNOSIS — Z992 Dependence on renal dialysis: Secondary | ICD-10-CM | POA: Diagnosis not present

## 2022-04-14 DIAGNOSIS — E1122 Type 2 diabetes mellitus with diabetic chronic kidney disease: Secondary | ICD-10-CM | POA: Diagnosis not present

## 2022-04-14 DIAGNOSIS — L97828 Non-pressure chronic ulcer of other part of left lower leg with other specified severity: Secondary | ICD-10-CM | POA: Insufficient documentation

## 2022-04-14 DIAGNOSIS — I120 Hypertensive chronic kidney disease with stage 5 chronic kidney disease or end stage renal disease: Secondary | ICD-10-CM | POA: Diagnosis not present

## 2022-04-16 NOTE — Progress Notes (Signed)
Elizabeth, [name] ([id number]) Visit Report for 04/14/2022 Abuse Risk Screen Details Patient Name: [name], [name] Date of Service: 04/14/2022 10:00 AM Medical Record Number: [id number] Patient Account Number: [id number] Date of Birth/Sex: 1966-06-26 (56 y.o. F) Treating RN: Cornell Barman Primary Care Lamarco Gudiel: SYSTEM, PCP Other Clinician: Referring Kyrese Gartman: Maia Breslow, [place] Treating Shajuan Musso/Extender: Skipper Cliche in Treatment: 0 Abuse Risk Screen Items Answer ABUSE RISK SCREEN: Has anyone close to you tried to hurt or harm you recentlyo No Do you feel uncomfortable with anyone in your familyo No Has anyone forced you do things that you didnot want to doo No Electronic Signature(s) Signed: 04/14/2022 5:01:39 PM By: Gretta Cool, BSN, RN, CWS, Kim RN, BSN Entered By: Gretta Cool, BSN, RN, CWS, Kim on 04/14/2022 10:36:45 [name] ([id number]) -------------------------------------------------------------------------------- Activities of Daily Living Details Patient Name: [name] Date of Service: 04/14/2022 10:00 AM Medical Record Number: [id number] Patient Account Number: [id number] Date of Birth/Sex: 1965-10-18 (55 y.o. F) Treating RN: Cornell Barman Primary Care Karina Lenderman: SYSTEM, PCP Other Clinician: Referring Malina Geers: Maia Breslow, [place] Treating Aniel Hubble/Extender: Skipper Cliche in Treatment: 0 Activities of Daily Living Items Answer Activities of Daily Living (Please select one for each item) Drive Automobile Need Assistance Take Medications Completely Able Use Telephone Completely Able Care for Appearance Completely Able Use Toilet Completely Able Bath / Shower Completely Able Dress Self Completely Able Feed Self Completely Able Walk Completely Able Get In / Out Bed Completely Able Housework Completely Able Prepare Meals Completely Able Handle Money Completely Able Shop for Self Completely Able Electronic Signature(s) Signed: 04/14/2022 5:01:39 PM By: Gretta Cool, BSN, RN, CWS, Kim RN,  BSN Entered By: Gretta Cool, BSN, RN, CWS, Kim on 04/14/2022 10:37:11 [name] ([id number]) -------------------------------------------------------------------------------- Education Screening Details Patient Name: [name] Date of Service: 04/14/2022 10:00 AM Medical Record Number: [id number] Patient Account Number: [id number] Date of Birth/Sex: 09/12/1965 (55 y.o. F) Treating RN: Cornell Barman Primary Care Areli Frary: SYSTEM, PCP Other Clinician: Referring Laramie Meissner: Maia Breslow, [place] Treating Anicka Stuckert/Extender: Skipper Cliche in Treatment: 0 Primary Learner Assessed: Patient Learning Preferences/Education Level/Primary Language Learning Preference: Explanation Highest Education Level: College or Above Preferred Language: English Cognitive Barrier Language Barrier: No Translator Needed: No Memory Deficit: No Emotional Barrier: No Physical Barrier Impaired Vision: Yes Glasses Impaired Hearing: No Decreased Hand dexterity: No Knowledge/Comprehension Knowledge Level: High Comprehension Level: High Ability to understand written instructions: High Ability to understand verbal instructions: High Motivation Anxiety Level: Calm Cooperation: Cooperative Education Importance: Acknowledges Need Interest in Health Problems: Asks Questions Perception: Coherent Willingness to Engage in Self-Management High Activities: Readiness to Engage in Self-Management High Activities: Engineer, maintenance) Signed: 04/14/2022 5:01:39 PM By: Gretta Cool, BSN, RN, CWS, Kim RN, BSN Entered By: Gretta Cool, BSN, RN, CWS, Kim on 04/14/2022 10:37:40 [name] ([id number]) -------------------------------------------------------------------------------- Fall Risk Assessment Details Patient Name: [name] Date of Service: 04/14/2022 10:00 AM Medical Record Number: [id number] Patient Account Number: [id number] Date of Birth/Sex: [birth year] (55 y.o. F) Treating RN: Cornell Barman Primary Care Tawn Fitzner: SYSTEM,  PCP Other Clinician: Referring Chivas Notz: Maia Breslow, [place] Treating Sevrin Sally/Extender: Skipper Cliche in Treatment: 0 Fall Risk Assessment Items Have you had 2 or more falls in the last 12 monthso 0 Yes Have you had any fall that resulted in injury in the last 12 monthso 0 Yes FALLS RISK SCREEN History of falling - immediate or within 3 months 0 No Secondary diagnosis (Do you have 2 or more medical diagnoseso) 0 No Ambulatory aid None/bed rest/wheelchair/nurse 0 Yes Crutches/cane/walker 0 No Furniture 0 No Intravenous therapy Access/Saline/Heparin Lock 0 No Gait/Transferring Normal/ bed  rest/ wheelchair 0 Yes Weak (short steps with or without shuffle, stooped but able to lift head while walking, may 0 No seek support from furniture) Impaired (short steps with shuffle, may have difficulty arising from chair, head down, impaired 0 No balance) Mental Status Oriented to own ability 0 Yes Electronic Signature(s) Signed: 04/14/2022 5:01:39 PM By: Gretta Cool, BSN, RN, CWS, Kim RN, BSN Entered By: Gretta Cool, BSN, RN, CWS, Kim on 04/14/2022 10:38:17 [name] ([id number]) -------------------------------------------------------------------------------- Foot Assessment Details Patient Name: [name] Date of Service: 04/14/2022 10:00 AM Medical Record Number: [id number] Patient Account Number: [id number] Date of Birth/Sex: 1966/03/01 (55 y.o. F) Treating RN: Cornell Barman Primary Care Seymone Forlenza: SYSTEM, PCP Other Clinician: Referring Jasnoor Trussell: Maia Breslow, [place] Treating Tamiko Leopard/Extender: Skipper Cliche in Treatment: 0 Foot Assessment Items Site Locations + = Sensation present, - = Sensation absent, C = Callus, U = Ulcer R = Redness, W = Warmth, M = Maceration, PU = Pre-ulcerative lesion F = Fissure, S = Swelling, D = Dryness Assessment Right: Left: Other Deformity: No No Prior Foot Ulcer: No No Prior Amputation: No No Charcot Joint: No No Ambulatory Status: Ambulatory With  Help Assistance Device: Walker Gait: Administrator, arts) Signed: 04/14/2022 5:01:39 PM By: Gretta Cool, BSN, RN, CWS, Kim RN, BSN Entered By: Gretta Cool, BSN, RN, CWS, Kim on 04/14/2022 10:39:20 [name] ([id number]) -------------------------------------------------------------------------------- Nutrition Risk Screening Details Patient Name: [name] Date of Service: 04/14/2022 10:00 AM Medical Record Number: [id number] Patient Account Number: [id number] Date of Birth/Sex: [birth year] (55 y.o. F) Treating RN: Cornell Barman Primary Care Neenah Canter: SYSTEM, PCP Other Clinician: Referring Carrisa Keller: Maia Breslow, [place] Treating Makena Murdock/Extender: Skipper Cliche in Treatment: 0 Height (in): 67 Weight (lbs): 164 Body Mass Index (BMI): 25.7 Nutrition Risk Screening Items Score Screening NUTRITION RISK SCREEN: I have an illness or condition that made me change the kind and/or amount of food I eat 0 No I eat fewer than two meals per day 0 No I eat few fruits and vegetables, or milk products 0 No I have three or more drinks of beer, liquor or wine almost every day 0 No I have tooth or mouth problems that make it hard for me to eat 0 No I don't always have enough money to buy the food I need 0 No I eat alone most of the time 0 No I take three or more different prescribed or over-the-counter drugs a day 1 Yes Without wanting to, I have lost or gained 10 pounds in the last six months 0 No I am not always physically able to shop, cook and/or feed myself 0 No Nutrition Protocols Good Risk Protocol 0 No interventions needed Moderate Risk Protocol High Risk Proctocol Risk Level: Good Risk Score: 1 Electronic Signature(s) Signed: 04/14/2022 5:01:39 PM By: Gretta Cool, BSN, RN, CWS, Kim RN, BSN Entered By: Gretta Cool, BSN, RN, CWS, Kim on 04/14/2022 10:38:43

## 2022-04-16 NOTE — Progress Notes (Signed)
[name], [name] ([id number]) Visit Report for 04/14/2022 Allergy List Details Patient Name: [name], [name] Date of Service: 04/14/2022 10:00 AM Medical Record Number: [id number] Patient Account Number: [id number] Date of Birth/Sex: [birth year] (56 y.o. F) Treating RN: Cornell Barman Primary Care Agnes Probert: SYSTEM, PCP Other Clinician: Referring Ikran Patman: Maia Breslow, [place] Treating Ethelwyn Gilbertson/Extender: Skipper Cliche in Treatment: 0 Allergies Active Allergies Iodinated Contrast Media Allergy Notes Electronic Signature(s) Signed: 04/14/2022 5:01:39 PM By: Gretta Cool, BSN, RN, CWS, Kim RN, BSN Entered By: Gretta Cool, BSN, RN, CWS, Kim on 04/14/2022 10:32:21 [name] ([id number]) -------------------------------------------------------------------------------- Arrival Information Details Patient Name: [name] Date of Service: 04/14/2022 10:00 AM Medical Record Number: [id number] Patient Account Number: [id number] Date of Birth/Sex: 05-02-1966 (56 y.o. F) Treating RN: Cornell Barman Primary Care Terecia Plaut: SYSTEM, PCP Other Clinician: Referring Cordai Rodrigue: Maia Breslow, [place] Treating Alvin Rubano/Extender: Skipper Cliche in Treatment: 0 Visit Information Patient Arrived: Wheel Chair Arrival Time: 10:28 Accompanied By: husband Transfer Assistance: Manual Patient Identification Verified: No Secondary Verification Process Completed: No Patient Requires Transmission-Based Precautions: No Patient Has Alerts: Yes Patient Alerts: Diabetic Type II Electronic Signature(s) Signed: 04/14/2022 5:01:39 PM By: Gretta Cool, BSN, RN, CWS, Kim RN, BSN Entered By: Gretta Cool, BSN, RN, CWS, Kim on 04/14/2022 10:30:28 [name] ([id number]) -------------------------------------------------------------------------------- Clinic Level of Care Assessment Details Patient Name: [name] Date of Service: 04/14/2022 10:00 AM Medical Record Number: [id number] Patient Account Number: [id number] Date of Birth/Sex: 1966/06/21 (56 y.o.  F) Treating RN: Cornell Barman Primary Care Eliani Leclere: SYSTEM, PCP Other Clinician: Referring Kile Kabler: Maia Breslow, [place] Treating Daelin Haste/Extender: Skipper Cliche in Treatment: 0 Clinic Level of Care Assessment Items TOOL 1 Quantity Score []  - Use when EandM and Procedure is performed on INITIAL visit 0 ASSESSMENTS - Nursing Assessment / Reassessment X - General Physical Exam (combine w/ comprehensive assessment (listed just below) when performed on new 1 20 pt. evals) X- 1 25 Comprehensive Assessment (HX, ROS, Risk Assessments, Wounds Hx, etc.) ASSESSMENTS - Wound and Skin Assessment / Reassessment []  - Dermatologic / Skin Assessment (not related to wound area) 0 ASSESSMENTS - Ostomy and/or Continence Assessment and Care []  - Incontinence Assessment and Management 0 []  - 0 Ostomy Care Assessment and Management (repouching, etc.) PROCESS - Coordination of Care X - Simple Patient / Family Education for ongoing care 1 15 []  - 0 Complex (extensive) Patient / Family Education for ongoing care X- 1 10 Staff obtains Programmer, systems, Records, Test Results / Process Orders []  - 0 Staff telephones HHA, Nursing Homes / Clarify orders / etc []  - 0 Routine Transfer to another Facility (non-emergent condition) []  - 0 Routine Hospital Admission (non-emergent condition) X- 1 15 New Admissions / Biomedical engineer / Ordering NPWT, Apligraf, etc. []  - 0 Emergency Hospital Admission (emergent condition) PROCESS - Special Needs []  - Pediatric / Minor Patient Management 0 []  - 0 Isolation Patient Management []  - 0 Hearing / Language / Visual special needs []  - 0 Assessment of Community assistance (transportation, D/C planning, etc.) []  - 0 Additional assistance / Altered mentation []  - 0 Support Surface(s) Assessment (bed, cushion, seat, etc.) INTERVENTIONS - Miscellaneous []  - External ear exam 0 []  - 0 Patient Transfer (multiple staff / Civil Service fast streamer / Similar devices) []  - 0 Simple  Staple / Suture removal (25 or less) []  - 0 Complex Staple / Suture removal (26 or more) []  - 0 Hypo/Hyperglycemic Management (do not check if billed separately) X- 1 15 Ankle / Brachial Index (ABI) - do not check if billed separately Has the patient been seen at the  hospital within the last three years: Yes Total Score: 100 Level Of Care: New/Established - Level 3 [name], [name] ([id number]) Electronic Signature(s) Signed: 04/14/2022 5:01:39 PM By: Gretta Cool, BSN, RN, CWS, Kim RN, BSN Entered By: Gretta Cool, BSN, RN, CWS, Kim on 04/14/2022 11:18:17 [name] ([id number]) -------------------------------------------------------------------------------- Encounter Discharge Information Details Patient Name: [name] Date of Service: 04/14/2022 10:00 AM Medical Record Number: [id number] Patient Account Number: [id number] Date of Birth/Sex: [birth year] (56 y.o. F) Treating RN: Cornell Barman Primary Care Annaliza Zia: SYSTEM, PCP Other Clinician: Referring Camren Henthorn: Maia Breslow, [place] Treating Fleet Higham/Extender: Skipper Cliche in Treatment: 0 Encounter Discharge Information Items Post Procedure Vitals Discharge Condition: Stable Temperature (F): 97.8 Ambulatory Status: Wheelchair Pulse (bpm): 90 Discharge Destination: Home Respiratory Rate (breaths/min): 18 Transportation: Private Auto Blood Pressure (mmHg): 177/103 Accompanied By: husband Schedule Follow-up Appointment: Yes Clinical Summary of Care: Electronic Signature(s) Signed: 04/14/2022 5:01:39 PM By: Gretta Cool, BSN, RN, CWS, Kim RN, BSN Entered By: Gretta Cool, BSN, RN, CWS, Kim on 04/14/2022 11:19:35 [name] ([id number]) -------------------------------------------------------------------------------- Lower Extremity Assessment Details Patient Name: [name] Date of Service: 04/14/2022 10:00 AM Medical Record Number: [id number] Patient Account Number: [id number] Date of Birth/Sex: 31-Oct-1965 (56 y.o. F) Treating RN: Cornell Barman Primary Care Antha Niday: SYSTEM, PCP Other Clinician: Referring Khayri Kargbo: Maia Breslow, [place] Treating Drexel Ivey/Extender: Skipper Cliche in Treatment: 0 Edema Assessment Assessed: [Left: Yes] [Right: Yes] Edema: [Left: No] [Right: No] Vascular Assessment Pulses: Dorsalis Pedis Palpable: [Left:Yes] Doppler Audible: [Left:Yes] Posterior Tibial Doppler Audible: [Left:Yes] Blood Pressure: Brachial: [Left:158] Ankle: [Left:Dorsalis Pedis: 182 1.15] [Right:Dorsalis Pedis: 184 1.16] Electronic Signature(s) Signed: 04/14/2022 5:01:39 PM By: Gretta Cool, BSN, RN, CWS, Kim RN, BSN Entered By: Gretta Cool, BSN, RN, CWS, Kim on 04/14/2022 10:57:11 [name] ([id number]) -------------------------------------------------------------------------------- Multi Wound Chart Details Patient Name: [name] Date of Service: 04/14/2022 10:00 AM Medical Record Number: [id number] Patient Account Number: [id number] Date of Birth/Sex: Jul 01, 1966 (56 y.o. F) Treating RN: Cornell Barman Primary Care Azha Constantin: SYSTEM, PCP Other Clinician: Referring Derry Kassel: Maia Breslow, [place] Treating Kodiak Rollyson/Extender: Skipper Cliche in Treatment: 0 Vital Signs Height(in): 67 Pulse(bpm): 90 Weight(lbs): 164 Blood Pressure(mmHg): 177/103 Body Mass Index(BMI): 25.7 Temperature(F): 97.8 Respiratory Rate(breaths/min): 16 Photos: [N/A:N/A] Wound Location: Left, Posterior Lower Leg N/A N/A Wounding Event: Gradually Appeared N/A N/A Primary Etiology: Diabetic Wound/Ulcer of the Lower N/A N/A Extremity Secondary Etiology: Atypical N/A N/A Comorbid History: Anemia, Asthma, Type II Diabetes, N/A N/A Neuropathy Date Acquired: 02/23/2022 N/A N/A Weeks of Treatment: 0 N/A N/A Wound Status: Open N/A N/A Wound Recurrence: No N/A N/A Measurements L x W x D (cm) 1.2x1.2x0.2 N/A N/A Area (cm) : 1.131 N/A N/A Volume (cm) : 0.226 N/A N/A % Reduction in Area: 0.00% N/A N/A % Reduction in Volume: 0.00% N/A N/A Classification:  Grade 1 N/A N/A Exudate Amount: Medium N/A N/A Exudate Type: Sanguinous N/A N/A Exudate Color: red N/A N/A Wound Margin: Flat and Intact N/A N/A Granulation Amount: None Present (0%) N/A N/A Necrotic Amount: Large (67-100%) N/A N/A Necrotic Tissue: Eschar N/A N/A Epithelialization: None N/A N/A Debridement: Debridement - Excisional N/A N/A Pre-procedure Verification/Time 11:07 N/A N/A Out Taken: Tissue Debrided: Necrotic/Eschar, Subcutaneous N/A N/A Level: Skin/Subcutaneous Tissue N/A N/A Debridement Area (sq cm): 1.44 N/A N/A Instrument: Curette N/A N/A Bleeding: Minimum N/A N/A Hemostasis Achieved: Pressure N/A N/A Debridement Treatment Procedure was tolerated well N/A N/A Response: Post Debridement 1.2x1.2x0.2 N/A N/A Measurements L x W x D (cm) Post Debridement Volume: 0.226 N/A N/A (cm) Procedures Performed: Debridement N/A N/A [name], [name] ([id number]) Treatment Notes Wound #1 (Lower Leg) Wound  Laterality: Left, Posterior Cleanser Byram Ancillary Kit - 15 Day Supply Discharge Instruction: Use supplies as instructed; Kit contains: (15) Saline Bullets; (15) 3x3 Gauze; 15 pr Gloves Peri-Wound Care Topical Santyl Collagenase Ointment, 30 (gm), tube Discharge Instruction: apply nickel thick to wound bed only Primary Dressing Gauze Discharge Instruction: As directed: moistened with saline Secondary Dressing Secured With Tegaderm Film 4x4 (in/in) Discharge Instruction: Apply to wound bed Compression Wrap Compression Stockings Add-Ons Electronic Signature(s) Signed: 04/14/2022 5:00:51 PM By: Gretta Cool, BSN, RN, CWS, Kim RN, BSN Entered By: Gretta Cool, BSN, RN, CWS, Kim on 04/14/2022 17:00:51 [name] ([id number]) -------------------------------------------------------------------------------- [place] Details Patient Name: [name] Date of Service: 04/14/2022 10:00 AM Medical Record Number: [id number] Patient Account Number: [id number] Date of  Birth/Sex: [birth year] (56 y.o. F) Treating RN: Cornell Barman Primary Care Akeela Busk: SYSTEM, PCP Other Clinician: Referring Breezie Micucci: Maia Breslow, [place] Treating Isador Castille/Extender: Skipper Cliche in Treatment: 0 Active Inactive Medication Nursing Diagnoses: Knowledge deficit related to medication safety: actual or potential Goals: Patient/caregiver will demonstrate understanding of all current medications Date Initiated: 04/14/2022 Target Resolution Date: 04/14/2022 Goal Status: Active Patient/caregiver will demonstrate understanding of new oral/IV medications prescribed at the [facility] (topical prescriptions are covered under the skin breakdown problem) Date Initiated: 04/14/2022 Target Resolution Date: 04/14/2022 Goal Status: Active Interventions: Assess for medication contraindications each visit where new medications are prescribed Assess patient/caregiver ability to manage medication regimen upon admission and as needed Patient/Caregiver given reconciled medication list upon admission, changes in medications and discharge from the [place] education on medication safety Treatment Activities: New medication prescribed at [place] : 04/14/2022 Notes: Necrotic Tissue Nursing Diagnoses: Impaired tissue integrity related to necrotic/devitalized tissue Knowledge deficit related to management of necrotic/devitalized tissue Goals: Necrotic/devitalized tissue will be minimized in the wound bed Date Initiated: 04/14/2022 Target Resolution Date: 04/14/2022 Goal Status: Active Patient/caregiver will verbalize understanding of reason and process for debridement of necrotic tissue Date Initiated: 04/14/2022 Target Resolution Date: 04/14/2022 Goal Status: Active Interventions: Assess patient pain level pre-, during and post procedure and prior to discharge Provide education on necrotic tissue and debridement process Treatment Activities: Excisional debridement :  04/14/2022 Notes: Orientation to the Wound Care Program Nursing Diagnoses: Knowledge deficit related to the wound healing center program Goals: [name], [name] ([id number]) Patient/caregiver will verbalize understanding of the [place] Program Date Initiated: 04/14/2022 Target Resolution Date: 04/14/2022 Goal Status: Active Interventions: Provide education on orientation to the wound center 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 (use after diagnosis is confirmed) Knowledge deficit related to disease process and management Potential for venous Insuffiency (use before diagnosis confirmed) Goals: Patient will maintain optimal edema control Date Initiated: 04/14/2022 Target Resolution Date: 04/14/2022 Goal Status: Active Patient/caregiver will verbalize understanding of disease process and disease management Date Initiated: 04/14/2022 Target Resolution Date: 04/14/2022 Goal Status: Active Verify adequate tissue perfusion prior to therapeutic compression application Date Initiated: 04/14/2022 Target Resolution Date: 04/14/2022 Goal Status: Active Interventions: Assess peripheral edema status every visit. Treatment Activities: Non-invasive vascular studies : 04/14/2022 Notes: Wound/Skin Impairment Nursing Diagnoses: Impaired tissue integrity Knowledge deficit related to smoking impact on wound healing Knowledge deficit related to ulceration/compromised skin integrity Goals: Patient/caregiver will verbalize understanding of skin care regimen Date Initiated: 04/14/2022 Target Resolution Date: 04/14/2022 Goal Status: Active Ulcer/skin breakdown will have a volume reduction of 30% by week 4 Date Initiated: 04/14/2022 Target Resolution Date: 05/05/2022 Goal Status: Active Interventions: Assess patient/caregiver ability to obtain necessary supplies Assess patient/caregiver ability to perform ulcer/skin care regimen upon  admission and as needed Assess  ulceration(s) every visit Treatment Activities: Referred to DME Agapita Savarino for dressing supplies : 04/14/2022 Skin care regimen initiated : 04/14/2022 Topical wound management initiated : 04/14/2022 Notes: Electronic Signature(s) Signed: 04/14/2022 5:01:39 PM By: Gretta Cool, BSN, RN, CWS, Kim RN, BSN Entered By: Gretta Cool, BSN, RN, CWS, Kim on 04/14/2022 11:06:52 [name], [name] ([id number]) [name], [name] ([id number]) -------------------------------------------------------------------------------- Pain Assessment Details Patient Name: [name] Date of Service: 04/14/2022 10:00 AM Medical Record Number: [id number] Patient Account Number: [id number] Date of Birth/Sex: [birth year] (56 y.o. F) Treating RN: Cornell Barman Primary Care Kiaja Shorty: SYSTEM, PCP Other Clinician: Referring Aeden Matranga: Maia Breslow, [place] Treating Amethyst Gainer/Extender: Skipper Cliche in Treatment: 0 Active Problems Location of Pain [facility] and Description of Pain Patient Has Paino No Site Locations Pain Management and Medication Current Pain Management: Electronic Signature(s) Signed: 04/14/2022 5:01:39 PM By: Gretta Cool, BSN, RN, CWS, Kim RN, BSN Entered By: Gretta Cool, BSN, RN, CWS, Kim on 04/14/2022 10:30:44 [name] ([id number]) -------------------------------------------------------------------------------- Patient/Caregiver Education Details Patient Name: [name] Date of Service: 04/14/2022 10:00 AM Medical Record Number: [id number] Patient Account Number: [id number] Date of Birth/Gender: 07-Dec-1965 (56 y.o. F) Treating RN: Cornell Barman Primary Care Physician: SYSTEM, PCP Other Clinician: Referring Physician: Maia Breslow, [place] Treating Physician/Extender: Skipper Cliche in Treatment: 0 Education Assessment Education Provided To: Patient Education Topics Provided Welcome To [facility]: Handouts: Welcome To [facility] Methods: Explain/Verbal Responses: State content correctly Wound  Debridement: Handouts: Wound Debridement Methods: Explain/Verbal Responses: State content correctly Electronic Signature(s) Signed: 04/14/2022 5:01:39 PM By: Gretta Cool, BSN, RN, CWS, Kim RN, BSN Entered By: Gretta Cool, BSN, RN, CWS, Kim on 04/14/2022 11:18:43 [name] ([id number]) -------------------------------------------------------------------------------- Wound Assessment Details Patient Name: [name] Date of Service: 04/14/2022 10:00 AM Medical Record Number: [id number] Patient Account Number: [id number] Date of Birth/Sex: [birth year] (56 y.o. F) Treating RN: Cornell Barman Primary Care Polk Minor: SYSTEM, PCP Other Clinician: Referring Aliannah Holstrom: Maia Breslow, [place] Treating Carson Meche/Extender: Skipper Cliche in Treatment: 0 Wound Status Wound Number: 1 Primary Etiology: Diabetic Wound/Ulcer of the Lower Extremity Wound Location: Left, Posterior Lower Leg Secondary Etiology: Atypical Wounding Event: Gradually Appeared Wound Status: Open Date Acquired: 02/23/2022 Comorbid History: Anemia, Asthma, Type II Diabetes, Neuropathy Weeks Of Treatment: 0 Clustered Wound: No Photos Wound Measurements Length: (cm) 1.2 Width: (cm) 1.2 Depth: (cm) 0.2 Area: (cm) 1.131 Volume: (cm) 0.226 % Reduction in Area: 0% % Reduction in Volume: 0% Epithelialization: None Tunneling: No Undermining: No Wound Description Classification: Grade 1 Wound Margin: Flat and Intact Exudate Amount: Medium Exudate Type: Sanguinous Exudate Color: red Foul Odor After Cleansing: No Slough/Fibrino No Wound Bed Granulation Amount: None Present (0%) Necrotic Amount: Large (67-100%) Necrotic Quality: Eschar Treatment Notes Wound #1 (Lower Leg) Wound Laterality: Left, Posterior Cleanser Byram Ancillary Kit - 15 Day Supply Discharge Instruction: Use supplies as instructed; Kit contains: (15) Saline Bullets; (15) 3x3 Gauze; 15 pr Gloves Peri-Wound Care Topical Santyl Collagenase Ointment, 30 (gm),  tube Hilgers, [name] ([id number]) Discharge Instruction: apply nickel thick to wound bed only Primary Dressing Gauze Discharge Instruction: As directed: moistened with saline Secondary Dressing Secured With Tegaderm Film 4x4 (in/in) Discharge Instruction: Apply to wound bed Compression Wrap Compression Stockings Add-Ons Electronic Signature(s) Signed: 04/14/2022 5:00:37 PM By: Gretta Cool, BSN, RN, CWS, Kim RN, BSN Entered By: Gretta Cool, BSN, RN, CWS, Kim on 04/14/2022 17:00:37 [name] ([id number]) -------------------------------------------------------------------------------- [place] Details Patient Name: [name] Date of Service: 04/14/2022 10:00 AM Medical Record Number: [id number] Patient Account Number: [id number] Date of Birth/Sex: [birth year] (56 y.o. F) Treating RN:  Cornell Barman Primary Care Vic Esco: SYSTEM, PCP Other Clinician: Referring Corrion Stirewalt: Maia Breslow, [place] Treating Dellar Traber/Extender: Skipper Cliche in Treatment: 0 Vital Signs Time Taken: 10:31 Temperature (F): 97.8 Height (in): 67 Pulse (bpm): 90 Weight (lbs): 164 Respiratory Rate (breaths/min): 16 Body Mass Index (BMI): 25.7 Blood Pressure (mmHg): 177/103 Reference Range: 80 - 120 mg / dl Electronic Signature(s) Signed: 04/14/2022 5:01:39 PM By: Gretta Cool, BSN, RN, CWS, Kim RN, BSN Entered By: Gretta Cool, BSN, RN, CWS, Kim on 04/14/2022 10:32:07

## 2022-04-18 NOTE — Progress Notes (Signed)
TIJUANA, SCHEIDEGGER ([id number]) Visit Report for 04/14/2022 Chief Complaint Document Details Patient Name: [name], [name] Date of Service: 04/14/2022 10:00 AM Medical Record Number: [id number] Patient Account Number: [id number] Date of Birth/Sex: [birth year] (56 y.o. F) Treating RN: Cornell Barman Primary Care Provider: SYSTEM, PCP Other Clinician: Referring Provider: Maia Breslow, MADISON Treating Provider/Extender: Skipper Cliche in Treatment: 0 Information Obtained from: Patient Chief Complaint Ulcer left leg posteriorly Electronic Signature(s) Signed: 04/14/2022 11:02:24 AM By: Worthy Keeler PA-C Entered By: Worthy Keeler on 04/14/2022 11:02:24 [name] ([id number]) -------------------------------------------------------------------------------- Debridement Details Patient Name: [name] Date of Service: 04/14/2022 10:00 AM Medical Record Number: [id number] Patient Account Number: [id number] Date of Birth/Sex: Dec 21, 1965 (55 y.o. F) Treating RN: Cornell Barman Primary Care Provider: SYSTEM, PCP Other Clinician: Referring Provider: Maia Breslow, MADISON Treating Provider/Extender: Skipper Cliche in Treatment: 0 Debridement Performed for Wound #1 Left,Posterior Lower Leg Assessment: Performed By: Physician Tommie Sams., PA-C Debridement Type: Debridement Severity of Tissue Pre Debridement: Fat layer exposed Level of Consciousness (Pre- Awake and Alert procedure): Pre-procedure Verification/Time Out Yes - 11:07 Taken: Total Area Debrided (L x W): 1.2 (cm) x 1.2 (cm) = 1.44 (cm) Tissue and other material Viable, Non-Viable, Eschar, Subcutaneous debrided: Level: Skin/Subcutaneous Tissue Debridement Description: Excisional Instrument: Curette Bleeding: Minimum Hemostasis Achieved: Pressure Response to Treatment: Procedure was tolerated well Level of Consciousness (Post- Awake and Alert procedure): Post Debridement Measurements of Total Wound Length: (cm) 1.2 Width: (cm)  1.2 Depth: (cm) 0.2 Volume: (cm) 0.226 Character of Wound/Ulcer Post Debridement: Stable Severity of Tissue Post Debridement: Fat layer exposed Post Procedure Diagnosis Same as Pre-procedure Electronic Signature(s) Signed: 04/14/2022 5:01:39 PM By: Gretta Cool, BSN, RN, CWS, Kim RN, BSN Signed: 04/16/2022 5:21:19 PM By: Worthy Keeler PA-C Entered By: Gretta Cool, BSN, RN, CWS, Kim on 04/14/2022 11:13:47 [name] ([id number]) -------------------------------------------------------------------------------- HPI Details Patient Name: [name] Date of Service: 04/14/2022 10:00 AM Medical Record Number: [id number] Patient Account Number: [id number] Date of Birth/Sex: 03-06-1966 (56 y.o. F) Treating RN: Cornell Barman Primary Care Provider: SYSTEM, PCP Other Clinician: Referring Provider: Maia Breslow, MADISON Treating Provider/Extender: Skipper Cliche in Treatment: 0 History of Present Illness HPI Description: 04-14-2022 upon evaluation today patient presents for initial inspection here in the clinic concerning issues that she has been having with the wound over the left posterior lower extremity. This started out as a painful red burning area which she tells me has developed into more of an eschar covered region. 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 With that being said I discussed with her today what exactly that is and what it means. Also discussed that she is going to likely need to be closely monitored to ensure this does not get worse. I did perform a debridement today but I tried to be as careful as possible with this debridement in order to not cause anything to worsen nonetheless we needed to get some of the eschar off. 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 She also has been on doxycycline which was started on 03-27-2022 which has not really made  a significant improvement. She is also been on mupirocin topically which is not helping. She has been using some essential oil which she feels like made it feel little bit better but still this does not seem to be getting better. Overall she has become very frustrated with the situation. Electronic Signature(s) Signed: 04/14/2022 11:44:02  AM By: Worthy Keeler PA-C Entered By: Worthy Keeler on 04/14/2022 11:44:02 [name] ([id number]) -------------------------------------------------------------------------------- Physical Exam Details Patient Name: [name] Date of Service: 04/14/2022 10:00 AM Medical Record Number: [id number] Patient Account Number: [id number] Date of Birth/Sex: 07/07/1966 (55 y.o. F) Treating RN: Cornell Barman Primary Care Provider: SYSTEM, PCP Other Clinician: Referring Provider: Maia Breslow, MADISON Treating Provider/Extender: Skipper Cliche in Treatment: 0 Constitutional patient is hypertensive.. pulse regular and within target range for patient.Marland Kitchen respirations regular, non-labored and within target range for patient.Marland Kitchen temperature within target range for patient.. Well-nourished and well-hydrated in no acute distress. Eyes conjunctiva clear no eyelid edema noted. pupils equal round and reactive to light and accommodation. Respiratory normal breathing without difficulty. Cardiovascular 2+ dorsalis pedis/posterior tibialis pulses. no clubbing, cyanosis, significant edema, <3 sec cap refill. Musculoskeletal normal gait and posture. no significant deformity or arthritic changes, no loss or range of motion, no clubbing. Psychiatric this patient is able to make decisions and demonstrates good insight into disease process. Alert and Oriented x 3. pleasant and cooperative. Notes Upon inspection patient's wound bed actually showed signs of fairly good granulation and epithelization at this point. Fortunately I do not see any evidence of active infection locally or  systemically which is great news. With that being said I am concerned about the possibility of this being a calciphylaxis type issue. This could be consistent with this although I am not 100% sure at this point. Nonetheless I do believe that we will continue to monitor closely and any debridements that are done I will be very lightheaded with to be honest. The good news that she has excellent blood flow. Electronic Signature(s) Signed: 04/14/2022 11:45:04 AM By: Worthy Keeler PA-C Entered By: Worthy Keeler on 04/14/2022 11:45:04 CHARLEA, NARDO ([id number]) -------------------------------------------------------------------------------- Physician Orders Details Patient Name: [name] Date of Service: 04/14/2022 10:00 AM Medical Record Number: [id number] Patient Account Number: [id number] Date of Birth/Sex: [birth year] (55 y.o. F) Treating RN: Cornell Barman Primary Care Provider: SYSTEM, PCP Other Clinician: Referring Provider: Maia Breslow, MADISON Treating Provider/Extender: Skipper Cliche in Treatment: 0 Verbal / Phone Orders: No Diagnosis Coding ICD-10 Coding Code Description E11.622 Type 2 diabetes mellitus with other skin ulcer L97.828 Non-pressure chronic ulcer of other part of left lower leg with other specified severity I10 Essential (primary) hypertension N18.6 End stage renal disease Z99.2 Dependence on renal dialysis Follow-up Appointments o Return Appointment in 1 month Bathing/ Shower/ Hygiene o May shower; gently cleanse wound with antibacterial soap, rinse and pat dry prior to dressing wounds Anesthetic (Use 'Patient Medications' Section for Anesthetic Order Entry) o Lidocaine applied to wound bed Wound Treatment Wound #1 - Lower Leg Wound Laterality: Left, Posterior Cleanser: Byram Ancillary Kit - 15 Day Supply (DME) (Generic) 1 x Per Day/30 Days Discharge Instructions: Use supplies as instructed; Kit contains: (15) Saline Bullets; (15) 3x3 Gauze; 15 pr  Gloves Topical: Santyl Collagenase Ointment, 30 (gm), tube 1 x Per Day/30 Days Discharge Instructions: apply nickel thick to wound bed only Primary Dressing: Gauze 1 x Per Day/30 Days Discharge Instructions: As directed: moistened with saline Secured With: Tegaderm Film 4x4 (in/in) 1 x Per Day/30 Days Discharge Instructions: Apply to wound bed Patient Medications Allergies: Iodinated Contrast Media Notifications Medication Indication Start End Santyl 04/14/2022 DOSE topical 250 unit/gram ointment - ointment topical ointment topical Apply nickel thick daily to the wound bed and then cover with a dressing as directed in clinic x 30 days Electronic Signature(s) Signed: 04/14/2022 3:47:14 PM By: Melburn Hake,  Margarita Grizzle PA-C Previous Signature: 04/14/2022 11:41:31 AM Version By: Worthy Keeler PA-C Entered By: Worthy Keeler on 04/14/2022 15:47:14 ZANIA, KALISZ ([id number]) -------------------------------------------------------------------------------- Problem List Details Patient Name: [name] Date of Service: 04/14/2022 10:00 AM Medical Record Number: [id number] Patient Account Number: [id number] Date of Birth/Sex: Jul 15, 1966 (55 y.o. F) Treating RN: Cornell Barman Primary Care Provider: SYSTEM, PCP Other Clinician: Referring Provider: Maia Breslow, MADISON Treating Provider/Extender: Skipper Cliche in Treatment: 0 Active Problems ICD-10 Encounter Code Description Active Date MDM Diagnosis E11.622 Type 2 diabetes mellitus with other skin ulcer 04/14/2022 No Yes L97.828 Non-pressure chronic ulcer of other part of left lower leg with other 04/14/2022 No Yes specified severity I10 Essential (primary) hypertension 04/14/2022 No Yes N18.6 End stage renal disease 04/14/2022 No Yes Z99.2 Dependence on renal dialysis 04/14/2022 No Yes Inactive Problems Resolved Problems Electronic Signature(s) Signed: 04/14/2022 11:01:45 AM By: Worthy Keeler PA-C Entered By: Worthy Keeler on 04/14/2022  11:01:44 [name] ([id number]) -------------------------------------------------------------------------------- Progress Note Details Patient Name: [name] Date of Service: 04/14/2022 10:00 AM Medical Record Number: [id number] Patient Account Number: [id number] Date of Birth/Sex: [birth year] (55 y.o. F) Treating RN: Cornell Barman Primary Care Provider: SYSTEM, PCP Other Clinician: Referring Provider: Maia Breslow, MADISON Treating Provider/Extender: Skipper Cliche in Treatment: 0 Subjective Chief Complaint Information obtained from Patient Ulcer left leg posteriorly History of Present Illness (HPI) 04-14-2022 upon evaluation today patient presents for initial inspection here in the clinic concerning issues that she has been having with the wound over the left posterior lower extremity. This started out as a painful red burning area which she tells me has developed into more of an eschar covered region. 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 With that being said I discussed with her today what exactly that is and what it means. Also discussed that she is going to likely need to be closely monitored to ensure this does not get worse. I did perform a debridement today but I tried to be as careful as possible with this debridement in order to not cause anything to worsen nonetheless we needed to get some of the eschar off. 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 She also has been on doxycycline which was started on 03-27-2022 which has not really made a significant improvement. She is also been on mupirocin topically which is not helping. She has been using some essential oil which she feels like made it feel little bit better but still this does not seem to be getting better. Overall she has become very frustrated with the situation. Patient  History Information obtained from Patient. Allergies Iodinated Contrast Media Social History Never smoker, Marital Status - Married, Alcohol Use - Never, Drug Use - No History, Caffeine Use - Daily. Medical History Hematologic/Lymphatic Patient has history of Anemia Respiratory Patient has history of Asthma Endocrine Patient has history of Type II Diabetes Neurologic Patient has history of Neuropathy Patient is treated with Insulin. Blood sugar is not tested. Medical And Surgical History Notes Musculoskeletal Hip replacement bilateral, right tendon Review of Systems (ROS) Genitourinary Complains or has symptoms of Kidney failure/ Dialysis - 4 years. Integumentary (Skin) Complains or has symptoms of Wounds, Bleeding or bruising tendency. Musculoskeletal Complains or has symptoms of Muscle Weakness - pt and ot. Psychiatric Denies complaints or symptoms of Anxiety, Claustrophobia. Objective SHINIQUA, GROSECLOSE ([id number]) Constitutional patient is hypertensive.. pulse regular  and within target range for patient.Marland Kitchen respirations regular, non-labored and within target range for patient.Marland Kitchen temperature within target range for patient.. Well-nourished and well-hydrated in no acute distress. Vitals Time Taken: 10:31 AM, Height: 67 in, Weight: 164 lbs, BMI: 25.7, Temperature: 97.8 F, Pulse: 90 bpm, Respiratory Rate: 16 breaths/min, Blood Pressure: 177/103 mmHg. Eyes conjunctiva clear no eyelid edema noted. pupils equal round and reactive to light and accommodation. Respiratory normal breathing without difficulty. Cardiovascular 2+ dorsalis pedis/posterior tibialis pulses. no clubbing, cyanosis, significant edema, Musculoskeletal normal gait and posture. no significant deformity or arthritic changes, no loss or range of motion, no clubbing. Psychiatric this patient is able to make decisions and demonstrates good insight into disease process. Alert and Oriented x 3. pleasant and  cooperative. General Notes: Upon inspection patient's wound bed actually showed signs of fairly good granulation and epithelization at this point. Fortunately I do not see any evidence of active infection locally or systemically which is great news. With that being said I am concerned about the possibility of this being a calciphylaxis type issue. This could be consistent with this although I am not 100% sure at this point. Nonetheless I do believe that we will continue to monitor closely and any debridements that are done I will be very lightheaded with to be honest. The good news that she has excellent blood flow. Integumentary (Hair, Skin) Wound #1 status is Open. Original cause of wound was Gradually Appeared. The date acquired was: 02/23/2022. The wound is located on the Left,Posterior Lower Leg. The wound measures 1.2cm length x 1.2cm width x 0.2cm depth; 1.131cm^2 area and 0.226cm^3 volume. There is no tunneling or undermining noted. There is a medium amount of sanguinous drainage noted. The wound margin is flat and intact. There is no granulation within the wound bed. There is a large (67-100%) amount of necrotic tissue within the wound bed including Eschar. Assessment Active Problems ICD-10 Type 2 diabetes mellitus with other skin ulcer Non-pressure chronic ulcer of other part of left lower leg with other specified severity Essential (primary) hypertension End stage renal disease Dependence on renal dialysis Procedures Wound #1 Pre-procedure diagnosis of Wound #1 is a Diabetic Wound/Ulcer of the Lower Extremity located on the Left,Posterior Lower Leg .Severity of Tissue Pre Debridement is: Fat layer exposed. There was a Excisional Skin/Subcutaneous Tissue Debridement with a total area of 1.44 sq cm performed by Tommie Sams., PA-C. With the following instrument(s): Curette to remove Viable and Non-Viable tissue/material. Material removed includes Eschar and Subcutaneous Tissue and.  No specimens were taken. A time out was conducted at 11:07, prior to the start of the procedure. A Minimum amount of bleeding was controlled with Pressure. The procedure was tolerated well. Post Debridement Measurements: 1.2cm length x 1.2cm width x 0.2cm depth; 0.226cm^3 volume. Character of Wound/Ulcer Post Debridement is stable. Severity of Tissue Post Debridement is: Fat layer exposed. Post procedure Diagnosis Wound #1: Same as Pre-Procedure Plan Follow-up Appointments: TEZRA, MAHR ([id number]) Return Appointment in 1 month Bathing/ Shower/ Hygiene: May shower; gently cleanse wound with antibacterial soap, rinse and pat dry prior to dressing wounds Anesthetic (Use 'Patient Medications' Section for Anesthetic Order Entry): Lidocaine applied to wound bed The following medication(s) was prescribed: Santyl topical 250 unit/gram ointment ointment topical ointment topical Apply nickel thick daily to the wound bed and then cover with a dressing as directed in clinic x 30 days starting 04/14/2022 WOUND #1: - Lower Leg Wound Laterality: Left, Posterior Cleanser: Byram Ancillary Kit - 15 Day Supply (  DME) (Generic) 1 x Per Day/30 Days Discharge Instructions: Use supplies as instructed; Kit contains: (15) Saline Bullets; (15) 3x3 Gauze; 15 pr Gloves Topical: Santyl Collagenase Ointment, 30 (gm), tube 1 x Per Day/30 Days Discharge Instructions: apply nickel thick to wound bed only Primary Dressing: Gauze 1 x Per Day/30 Days Discharge Instructions: As directed: moistened with saline Secured With: Tegaderm Film 4x4 (in/in) 1 x Per Day/30 Days Discharge Instructions: Apply to wound bed 1. I would recommend currently that we go ahead and continue with the wound care measures as before and the patient is in agreement with plan. This includes the use of the Santyl ointment. Actually sent this into the pharmacy for her and we will see if that will help to soften this up I did note to her that she probably  will start noticing this turns somewhat yellow that does not mean infection if the eschar is softening up in areas where it was not completely removed. 2. I am also can recommend saline moistened gauze and then Tegaderm to cover. 3. I am also can recommend that we have the patient continue to monitor for any signs of worsening or infection. Office if anything changes she should let me know but right now I think this is probably can to be our best option currently. We will see patient back for reevaluation in 1 week here in the clinic. If anything worsens or changes patient will contact our office for additional recommendations. Electronic Signature(s) Signed: 04/14/2022 5:01:22 PM By: Gretta Cool, BSN, RN, CWS, Kim RN, BSN Signed: 04/16/2022 5:21:19 PM By: Worthy Keeler PA-C Previous Signature: 04/14/2022 11:45:53 AM Version By: Worthy Keeler PA-C Entered By: Gretta Cool BSN, RN, CWS, Kim on 04/14/2022 17:01:21 REILLEY, VALENTINE ([id number]) -------------------------------------------------------------------------------- ROS/PFSH Details Patient Name: [name] Date of Service: 04/14/2022 10:00 AM Medical Record Number: [id number] Patient Account Number: [id number] Date of Birth/Sex: 1965/09/30 (56 y.o. F) Treating RN: Cornell Barman Primary Care Provider: SYSTEM, PCP Other Clinician: Referring Provider: Maia Breslow, MADISON Treating Provider/Extender: Skipper Cliche in Treatment: 0 Information Obtained From Patient Genitourinary Complaints and Symptoms: Positive for: Kidney failure/ Dialysis - 4 years Integumentary (Skin) Complaints and Symptoms: Positive for: Wounds; Bleeding or bruising tendency Musculoskeletal Complaints and Symptoms: Positive for: Muscle Weakness - pt and ot Medical History: Past Medical History Notes: Hip replacement bilateral, right tendon Psychiatric Complaints and Symptoms: Negative for: Anxiety; Claustrophobia Hematologic/Lymphatic Medical History: Positive for:  Anemia Respiratory Medical History: Positive for: Asthma Endocrine Medical History: Positive for: Type II Diabetes Time with diabetes: 71 Treated with: Insulin Blood sugar tested every day: No Neurologic Medical History: Positive for: Neuropathy Oncologic Immunizations Pneumococcal Vaccine: Received Pneumococcal Vaccination: No Bannan, Adrienna ([id number]) Implantable Devices No devices added Family and Social History Never smoker; Marital Status - Married; Alcohol Use: Never; Drug Use: No History; Caffeine Use: Daily Electronic Signature(s) Signed: 04/14/2022 5:01:39 PM By: Gretta Cool, BSN, RN, CWS, Kim RN, BSN Signed: 04/16/2022 5:21:19 PM By: Worthy Keeler PA-C Entered By: Gretta Cool BSN, RN, CWS, Kim on 04/14/2022 10:36:39 DAGNY, FIORENTINO ([id number]) -------------------------------------------------------------------------------- SuperBill Details Patient Name: [name] Date of Service: 04/14/2022 Medical Record Number: [id number] Patient Account Number: [id number] Date of Birth/Sex: 01-30-1966 (55 y.o. F) Treating RN: Cornell Barman Primary Care Provider: SYSTEM, PCP Other Clinician: Referring Provider: Maia Breslow, MADISON Treating Provider/Extender: Skipper Cliche in Treatment: 0 Diagnosis Coding ICD-10 Codes Code Description E11.622 Type 2 diabetes mellitus with other skin ulcer L97.828 Non-pressure chronic ulcer of other part of left lower leg with other specified severity  I10 Essential (primary) hypertension N18.6 End stage renal disease Z99.2 Dependence on renal dialysis Facility Procedures CPT4 Code: [id number] Description: 14239 - WOUND CARE VISIT-LEV 3 EST PT Modifier: Quantity: 1 CPT4 Code: [id number] Description: 35686 - DEB SUBQ TISSUE 20 SQ CM/< Modifier: Quantity: 1 CPT4 Code: Description: ICD-10 Diagnosis Description L97.828 Non-pressure chronic ulcer of other part of left lower leg with other speci Modifier: fied severity Quantity: Physician Procedures CPT4  Code: [id number] Description: 02111 - WC PHYS LEVEL 4 - NEW PT Modifier: 25 Quantity: 1 CPT4 Code: Description: ICD-10 Diagnosis Description E11.622 Type 2 diabetes mellitus with other skin ulcer L97.828 Non-pressure chronic ulcer of other part of left lower leg with other spec I10 Essential (primary) hypertension N18.6 End stage renal disease Modifier: ified severity Quantity: CPT4 Code: [id number] Description: 23361 - WC PHYS SUBQ TISS 20 SQ CM Modifier: Quantity: 1 CPT4 Code: Description: ICD-10 Diagnosis Description L97.828 Non-pressure chronic ulcer of other part of left lower leg with other spec Modifier: ified severity Quantity: Electronic Signature(s) Signed: 04/14/2022 11:46:06 AM By: Worthy Keeler PA-C Entered By: Worthy Keeler on 04/14/2022 11:46:06

## 2022-04-21 ENCOUNTER — Encounter: Payer: Medicare Other | Admitting: Physician Assistant

## 2022-04-21 DIAGNOSIS — E11622 Type 2 diabetes mellitus with other skin ulcer: Secondary | ICD-10-CM | POA: Diagnosis not present

## 2022-04-21 NOTE — Progress Notes (Signed)
[name] ([id number]) Visit Report for 04/21/2022 Arrival Information Details Patient Name: [name], [name] Date of Service: 04/21/2022 3:45 PM Medical Record Number: [id number] Patient Account Number: [id number] Date of Birth/Sex: May 09, 1966 (56 y.o. F) Treating RN: [name] Primary Care [name]: SYSTEM, PCP Other Clinician: Massie [name] Referring [name]: [name] Treating [name]/Extender: [name] in Treatment: 1 Visit Information History Since Last Visit All ordered tests and consults were completed: No Patient Arrived: Wheel Chair Added or deleted any medications: No Arrival Time: 15:56 Any new allergies or adverse reactions: No Transfer Assistance: EasyPivot Patient Lift Had a fall or experienced change in No activities of daily living that may affect Patient Requires Transmission-Based No risk of falls: Precautions: Hospitalized since last visit: No Patient Has Alerts: Yes Pain Present Now: No Patient Alerts: Diabetic Type II Electronic Signature(s) Unsigned Entered ByMassie [name] on 04/21/2022 15:57:05 Signature(s): Date(s): [name], [name] ([id number]) -------------------------------------------------------------------------------- Clinic Level of Care Assessment Details Patient Name: [name] Date of Service: 04/21/2022 3:45 PM Medical Record Number: [id number] Patient Account Number: [id number] Date of Birth/Sex: September 24, 1965 (56 y.o. F) Treating RN: [name] Primary Care [name]: SYSTEM, PCP Other Clinician: Massie [name] Referring [name]: [name] Treating [name]/Extender: [name] in Treatment: 1 Clinic Level of Care Assessment Items TOOL 4 Quantity Score []  - Use when only an EandM is performed on FOLLOW-UP visit 0 ASSESSMENTS - Nursing Assessment / Reassessment X - Reassessment of Co-morbidities (includes updates in patient status) 1 10 X- 1 5 Reassessment of Adherence to Treatment  Plan ASSESSMENTS - Wound and Skin Assessment / Reassessment X - Simple Wound Assessment / Reassessment - one wound 1 5 []  - 0 Complex Wound Assessment / Reassessment - multiple wounds []  - 0 Dermatologic / Skin Assessment (not related to wound area) ASSESSMENTS - Focused Assessment []  - Circumferential Edema Measurements - multi extremities 0 []  - 0 Nutritional Assessment / Counseling / Intervention []  - 0 Lower Extremity Assessment (monofilament, tuning fork, pulses) []  - 0 Peripheral Arterial Disease Assessment (using hand held doppler) ASSESSMENTS - Ostomy and/or Continence Assessment and Care []  - Incontinence Assessment and Management 0 []  - 0 Ostomy Care Assessment and Management (repouching, etc.) PROCESS - Coordination of Care X - Simple Patient / Family Education for ongoing care 1 15 []  - 0 Complex (extensive) Patient / Family Education for ongoing care []  - 0 Staff obtains Programmer, systems, Records, Test Results / Process Orders []  - 0 Staff telephones HHA, Nursing Homes / Clarify orders / etc []  - 0 Routine Transfer to another Facility (non-emergent condition) []  - 0 Routine Hospital Admission (non-emergent condition) []  - 0 New Admissions / Biomedical engineer / Ordering NPWT, Apligraf, etc. []  - 0 Emergency Hospital Admission (emergent condition) X- 1 10 Simple Discharge Coordination []  - 0 Complex (extensive) Discharge Coordination PROCESS - Special Needs []  - Pediatric / Minor Patient Management 0 []  - 0 Isolation Patient Management []  - 0 Hearing / Language / Visual special needs []  - 0 Assessment of Community assistance (transportation, D/C planning, etc.) []  - 0 Additional assistance / Altered mentation []  - 0 Support Surface(s) Assessment (bed, cushion, seat, etc.) INTERVENTIONS - Wound Cleansing / Measurement [name] ([id number]) X- 1 5 Simple Wound Cleansing - one wound []  - 0 Complex Wound Cleansing - multiple wounds X- 1 5 Wound  Imaging (photographs - any number of wounds) []  - 0 Wound Tracing (instead of photographs) X- 1 5 Simple Wound Measurement - one wound []  - 0 Complex Wound Measurement -  multiple wounds INTERVENTIONS - Wound Dressings $RemoveBeforeD'[]'vTtseTCCKpCAcL$  - Small Wound Dressing one or multiple wounds 0 X- 1 15 Medium Wound Dressing one or multiple wounds $RemoveBeforeD'[]'vGabmztZHgwNRv$  - 0 Large Wound Dressing one or multiple wounds X- 1 5 Application of Medications - topical $RemoveB'[]'JcdvzNYV$  - 0 Application of Medications - injection INTERVENTIONS - Miscellaneous $RemoveBeforeD'[]'EByvvlAbBFLfBk$  - External ear exam 0 $Remo'[]'haNML$  - 0 Specimen Collection (cultures, biopsies, blood, body fluids, etc.) $RemoveBefor'[]'IDoGsIePntqI$  - 0 Specimen(s) / Culture(s) sent or taken to Lab for analysis $RemoveBefo'[]'CltBHlTqUTH$  - 0 Patient Transfer (multiple staff / Civil Service fast streamer / Similar devices) $RemoveBeforeDE'[]'tBsVUREXDEfOrcK$  - 0 Simple Staple / Suture removal (25 or less) $Remove'[]'iJtwmQJ$  - 0 Complex Staple / Suture removal (26 or more) $Remove'[]'whzbexO$  - 0 Hypo / Hyperglycemic Management (close monitor of Blood Glucose) $RemoveBefore'[]'pWHWgSHmKPiVe$  - 0 Ankle / Brachial Index (ABI) - do not check if billed separately X- 1 5 Vital Signs Has the patient been seen at the hospital within the last three years: Yes Total Score: 85 Level Of Care: New/Established - Level 3 Electronic Signature(s) Unsigned Entered By: [name] on 04/21/2022 16:33:19 Signature(s): Date(s): [name], [name] ([id number]) -------------------------------------------------------------------------------- Encounter Discharge Information Details Patient Name: [name], [name] Date of Service: 04/21/2022 3:45 PM Medical Record Number: [id number] Patient Account Number: [id number] Date of Birth/Sex: 05-Feb-1966 (56 y.o. F) Treating RN: [name] Primary Care [name]: SYSTEM, PCP Other Clinician: Massie [name] Referring [name]: [name] Treating [name]/Extender: [name] in Treatment: 1 Encounter Discharge Information Items Discharge Condition: Stable Ambulatory Status: Wheelchair Discharge Destination: Home Transportation:  Private Auto Accompanied By: husband Schedule Follow-up Appointment: Yes Clinical Summary of Care: Electronic Signature(s) Unsigned Entered By: [name] on 04/21/2022 16:41:35 Signature(s): Date(s): SAMAH, LAPIANA ([id number]) -------------------------------------------------------------------------------- Lower Extremity Assessment Details Patient Name: [name], [name] Date of Service: 04/21/2022 3:45 PM Medical Record Number: [id number] Patient Account Number: [id number] Date of Birth/Sex: Sep 08, 1965 (56 y.o. F) Treating RN: [name] Primary Care Rasheema Truluck: SYSTEM, PCP Other Clinician: Massie [name] Referring Delight Bickle: [name] Treating Sharnita Bogucki/Extender: [name] in Treatment: 1 Electronic Signature(s) Unsigned Entered By: [name] on 04/21/2022 16:12:16 Signature(s): Date(s): NORELL, BRISBIN ([id number]) -------------------------------------------------------------------------------- Multi Wound Chart Details Patient Name: [name], [name] Date of Service: 04/21/2022 3:45 PM Medical Record Number: [id number] Patient Account Number: [id number] Date of Birth/Sex: 1966-07-23 (56 y.o. F) Treating RN: [name] Primary Care Jep Dyas: SYSTEM, PCP Other Clinician: Massie [name] Referring Asti Mackley: [name] Treating Bronson Bressman/Extender: [name] in Treatment: 1 Vital Signs Height(in): 67 Pulse(bpm): 82 Weight(lbs): 164 Blood Pressure(mmHg): 159/81 Body Mass Index(BMI): 25.7 Temperature(F): 98.1 Respiratory Rate(breaths/min): 18 Photos: [N/A:N/A] Wound Location: Left, Posterior Lower Leg N/A N/A Wounding Event: Gradually Appeared N/A N/A Primary Etiology: Diabetic Wound/Ulcer of the Lower N/A N/A Extremity Secondary Etiology: Atypical N/A N/A Comorbid History: Anemia, Asthma, Type II Diabetes, N/A N/A Neuropathy Date Acquired: 02/23/2022 N/A N/A Weeks of Treatment: 1 N/A N/A Wound Status: Open N/A N/A Wound Recurrence: No N/A  N/A Measurements L x W x D (cm) 1.5x1.5x0.1 N/A N/A Area (cm) : 1.767 N/A N/A Volume (cm) : 0.177 N/A N/A % Reduction in Area: -56.20% N/A N/A % Reduction in Volume: 21.70% N/A N/A Classification: Grade 1 N/A N/A Exudate Amount: Medium N/A N/A Exudate Type: Sanguinous N/A N/A Exudate Color: red N/A N/A Wound Margin: Flat and Intact N/A N/A Granulation Amount: None Present (0%) N/A N/A Necrotic Amount: Large (67-100%) N/A N/A Necrotic Tissue: Eschar N/A N/A Exposed Structures: Fat Layer (Subcutaneous Tissue): N/A N/A Yes Fascia: No Tendon: No Muscle: No Joint: No Bone: No Epithelialization: None N/A  N/A Treatment Notes Electronic Signature(s) Unsigned Entered By: [name] on 04/21/2022 16:13:10 Signature(s): Date(s): BRESLIN, BURKLOW ([id number]) SUZETTE, FLAGLER ([id number]) -------------------------------------------------------------------------------- Multi-Disciplinary Care Plan Details Patient Name: [name], [name] Date of Service: 04/21/2022 3:45 PM Medical Record Number: [id number] Patient Account Number: [id number] Date of Birth/Sex: 12-May-1966 (56 y.o. F) Treating RN: [name] Primary Care Roni Friberg: SYSTEM, PCP Other Clinician: Massie [name] Referring Auron Tadros: [name] Treating Antoria Lanza/Extender: [name] in Treatment: 1 Active Inactive Medication Nursing Diagnoses: Knowledge deficit related to medication safety: actual or potential Goals: Patient/caregiver will demonstrate understanding of all current medications Date Initiated: 04/14/2022 Target Resolution Date: 04/14/2022 Goal Status: Active Patient/caregiver will demonstrate understanding of new oral/IV medications prescribed at the [facility] (topical prescriptions are covered under the skin breakdown problem) Date Initiated: 04/14/2022 Target Resolution Date: 04/14/2022 Goal Status: Active Interventions: Assess for medication contraindications each visit where new medications are  prescribed Assess patient/caregiver ability to manage medication regimen upon admission and as needed Patient/Caregiver given reconciled medication list upon admission, changes in medications and discharge from the [facility] education on medication safety Treatment Activities: New medication prescribed at [facility] : 04/14/2022 Notes: Necrotic Tissue Nursing Diagnoses: Impaired tissue integrity related to necrotic/devitalized tissue Knowledge deficit related to management of necrotic/devitalized tissue Goals: Necrotic/devitalized tissue will be minimized in the wound bed Date Initiated: 04/14/2022 Target Resolution Date: 04/14/2022 Goal Status: Active Patient/caregiver will verbalize understanding of reason and process for debridement of necrotic tissue Date Initiated: 04/14/2022 Target Resolution Date: 04/14/2022 Goal Status: Active Interventions: Assess patient pain level pre-, during and post procedure and prior to discharge Provide education on necrotic tissue and debridement process Treatment Activities: Excisional debridement : 04/14/2022 Notes: Orientation to the Wound Care Program Nursing Diagnoses: Knowledge deficit related to the wound healing center program Goals: [name], [name] ([id number]) Patient/caregiver will verbalize understanding of the [facility] Date Initiated: 04/14/2022 Target Resolution Date: 04/14/2022 Goal Status: Active Interventions: Provide education on orientation to the wound center 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 (use after diagnosis is confirmed) Knowledge deficit related to disease process and management Potential for venous Insuffiency (use before diagnosis confirmed) Goals: Patient will maintain optimal edema control Date Initiated: 04/14/2022 Target Resolution Date: 04/14/2022 Goal Status: Active Patient/caregiver will verbalize understanding of disease process and disease  management Date Initiated: 04/14/2022 Target Resolution Date: 04/14/2022 Goal Status: Active Verify adequate tissue perfusion prior to therapeutic compression application Date Initiated: 04/14/2022 Target Resolution Date: 04/14/2022 Goal Status: Active Interventions: Assess peripheral edema status every visit. Treatment Activities: Non-invasive vascular studies : 04/14/2022 Notes: Wound/Skin Impairment Nursing Diagnoses: Impaired tissue integrity Knowledge deficit related to smoking impact on wound healing Knowledge deficit related to ulceration/compromised skin integrity Goals: Patient/caregiver will verbalize understanding of skin care regimen Date Initiated: 04/14/2022 Target Resolution Date: 04/14/2022 Goal Status: Active Ulcer/skin breakdown will have a volume reduction of 30% by week 4 Date Initiated: 04/14/2022 Target Resolution Date: 05/05/2022 Goal Status: Active Interventions: Assess patient/caregiver ability to obtain necessary supplies Assess patient/caregiver ability to perform ulcer/skin care regimen upon admission and as needed Assess ulceration(s) every visit Treatment Activities: Referred to DME Atha Mcbain for dressing supplies : 04/14/2022 Skin care regimen initiated : 04/14/2022 Topical wound management initiated : 04/14/2022 Notes: Electronic Signature(s) Unsigned Entered By: [name] on 04/21/2022 16:13:04 Signature(s): Date(s): [name], [name] ([id number]) [name], [name] ([id number]) -------------------------------------------------------------------------------- Pain Assessment Details Patient Name: [name], [name] Date of Service: 04/21/2022 3:45 PM Medical Record Number: [id number] Patient Account Number: [id number] Date of Birth/Sex: Jan 29, 1966 (56 y.o. F)  Treating RN: [name] Primary Care Harry Shuck: SYSTEM, PCP Other Clinician: Massie [name] Referring Soloman Mckeithan: [name] Treating Darrick Greenlaw/Extender: [name] in Treatment: 1 Active  Problems Location of Pain Severity and Description of Pain Patient Has Paino No Site Locations Pain Management and Medication Current Pain Management: Electronic Signature(s) Unsigned Entered By: [name] on 04/21/2022 16:10:49 Signature(s): Date(s): [name], [name] ([id number]) -------------------------------------------------------------------------------- Patient/Caregiver Education Details Patient Name: [name] Date of Service: 04/21/2022 3:45 PM Medical Record Number: [id number] Patient Account Number: [id number] Date of Birth/Gender: [birth year] (56 y.o. F) Treating RN: [name] Primary Care Physician: SYSTEM, PCP Other Clinician: Massie [name] Referring Physician: Zonia [name] Treating Physician/Extender: [name] in Treatment: 1 Education Assessment Education Provided To: Patient Education Topics Provided Wound/Skin Impairment: Handouts: Other: continue wound care as directed Methods: Explain/Verbal Responses: State content correctly Electronic Signature(s) Unsigned Entered By: [name] on 04/21/2022 16:33:51 Signature(s): Date(s): KEALOHILANI, MAIORINO ([id number]) -------------------------------------------------------------------------------- Wound Assessment Details Patient Name: NALAYAH, HITT Date of Service: 04/21/2022 3:45 PM Medical Record Number: [id number] Patient Account Number: [id number] Date of Birth/Sex: June 27, 1966 (56 y.o. F) Treating RN: [name] Primary Care Preslei Blakley: SYSTEM, PCP Other Clinician: Massie [name] Referring Xuan Mateus: [name] Treating Adalyn Pennock/Extender: [name] in Treatment: 1 Wound Status Wound Number: 1 Primary Etiology: Diabetic Wound/Ulcer of the Lower Extremity Wound Location: Left, Posterior Lower Leg Secondary Etiology: Atypical Wounding Event: Gradually Appeared Wound Status: Open Date Acquired: 02/23/2022 Comorbid History: Anemia, Asthma, Type II Diabetes, Neuropathy Weeks  Of Treatment: 1 Clustered Wound: No Photos Wound Measurements Length: (cm) 1.5 Width: (cm) 1.5 Depth: (cm) 0.1 Area: (cm) 1.767 Volume: (cm) 0.177 % Reduction in Area: -56.2% % Reduction in Volume: 21.7% Epithelialization: None Wound Description Classification: Grade 1 Wound Margin: Flat and Intact Exudate Amount: Medium Exudate Type: Sanguinous Exudate Color: red Foul Odor After Cleansing: No Slough/Fibrino No Wound Bed Granulation Amount: None Present (0%) Exposed Structure Necrotic Amount: Large (67-100%) Fascia Exposed: No Necrotic Quality: Eschar Fat Layer (Subcutaneous Tissue) Exposed: Yes Tendon Exposed: No Muscle Exposed: No Joint Exposed: No Bone Exposed: No Treatment Notes Wound #1 (Lower Leg) Wound Laterality: Left, Posterior Cleanser [place] Ancillary Kit - 15 Day Supply Discharge Instruction: Use supplies as instructed; Kit contains: (15) Saline Bullets; (15) 3x3 Gauze; 15 pr Gloves Peri-Wound Care Hibbitts, Denene ([id number]) Topical Santyl Collagenase Ointment, 30 (gm), tube Discharge Instruction: apply nickel thick to wound bed only Primary Dressing Gauze Discharge Instruction: As directed: moistened with saline Secondary Dressing Secured With Tegaderm Film 4x4 (in/in) Discharge Instruction: Apply to wound bed Compression Wrap Compression Stockings Add-Ons Electronic Signature(s) Unsigned Entered By: [name] on 04/21/2022 16:12:07 Signature(s): Date(s): [name] ([id number]) -------------------------------------------------------------------------------- Vitals Details Patient Name: [name] Date of Service: 04/21/2022 3:45 PM Medical Record Number: [id number] Patient Account Number: [id number] Date of Birth/Sex: [birth year] (56 y.o. F) Treating RN: [name] Primary Care Allysen Lazo: SYSTEM, PCP Other Clinician: Massie [name] Referring Demarea Lorey: [name] Treating Sonyia Muro/Extender: [name] in Treatment:  1 Vital Signs Time Taken: 15:57 Temperature (F): 98.1 Height (in): 67 Pulse (bpm): 92 Weight (lbs): 164 Respiratory Rate (breaths/min): 18 Body Mass Index (BMI): 25.7 Blood Pressure (mmHg): 159/81 Reference Range: 80 - 120 mg / dl Electronic Signature(s) Unsigned Entered ByMassie [name] on 04/21/2022 16:02:19 Signature(s): Date(s):

## 2022-04-21 NOTE — Progress Notes (Signed)
LAELAH, SIRAVO ([id number]) Visit Report for 04/21/2022 Chief Complaint Document Details Patient Name: [name], [name] Date of Service: 04/21/2022 3:45 PM Medical Record Number: [id number] Patient Account Number: [id number] Date of Birth/Sex: [name] (56 y.o. F) Treating RN: Carlene Coria Primary Care Provider: SYSTEM, PCP Other Clinician: Massie Kluver Referring Provider: Zonia Kief Treating Provider/Extender: Skipper Cliche in Treatment: 1 Information Obtained from: Patient Chief Complaint Ulcer left leg posteriorly Electronic Signature(s) Signed: 04/21/2022 3:51:38 PM By: Worthy Keeler PA-C Entered By: Worthy Keeler on 04/21/2022 15:51:37 [name], [name] ([id number]) -------------------------------------------------------------------------------- HPI Details Patient Name: [name] Date of Service: 04/21/2022 3:45 PM Medical Record Number: [id number] Patient Account Number: [id number] Date of Birth/Sex: [name] (56 y.o. F) Treating RN: Carlene Coria Primary Care Provider: SYSTEM, PCP Other Clinician: Massie Kluver Referring Provider: Zonia Kief Treating Provider/Extender: Skipper Cliche in Treatment: 1 History of Present Illness HPI Description: 04-14-2022 upon evaluation today patient presents for initial inspection here in the clinic concerning issues that she has been having with the wound over the left posterior lower extremity. This started out as a painful red burning area which she tells me has developed into more of an eschar covered region. 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 With that being said I discussed with her today what exactly that is and what it means. Also discussed that she is going to likely need to be closely monitored to ensure this does not get worse. I did perform a debridement today but I tried to be as careful as possible with this debridement in order to not cause anything to worsen nonetheless we needed  to get some of the eschar off. 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 She also has been on doxycycline which was started on 03-27-2022 which has not really made a significant improvement. She is also been on mupirocin topically which is not helping. She has been using some essential oil which she feels like made it feel little bit better but still this does not seem to be getting better. Overall she has become very frustrated with the situation. 04-21-2022 upon evaluation today patient appears to be doing well currently with regard to her wound. She has been tolerating the dressing changes using just the mupirocin we have been waiting on the Santyl. I tried multiple places to send this to [facility] as well ended up having a going through but at the same time she is not able to actually get that currently due to the fact that she has been told that I did not "write the prescription correctly and it will not go through her insurance that way". With that being said I am not exactly sure what could be wrong about it is actually a macro that I used to put the prescription in the dosing is appropriate and I even put the measurements on the prescription for the wound size which is needed as well. With that being said I am going to call the pharmacy today while the patient is here in the clinic to verify what is going on and make sure we get this taken care of. Electronic Signature(s) Signed: 04/21/2022 5:17:19 PM By: Worthy Keeler PA-C Entered By: Worthy Keeler on 04/21/2022 17:17:19 [name], [name] ([id number]) -------------------------------------------------------------------------------- Physical Exam Details Patient Name: [name] Date of Service: 04/21/2022 3:45 PM Medical Record Number: [id number] Patient Account Number: [id number]  Date of Birth/Sex: [name] (56 y.o.  F) Treating RN: Carlene Coria Primary Care Provider: SYSTEM, PCP Other Clinician: Massie Kluver Referring Provider: Zonia Kief Treating Provider/Extender: Skipper Cliche in Treatment: 1 Constitutional Well-nourished and well-hydrated in no acute distress. Respiratory normal breathing without difficulty. Psychiatric this patient is able to make decisions and demonstrates good insight into disease process. Alert and Oriented x 3. pleasant and cooperative. Notes Upon inspection patient's wound bed actually showed signs of good granulation and epithelization at this point. Fortunately I do not see any evidence of active infection locally or systemically at this time which is great news. No fevers, chills, nausea, vomiting, or diarrhea. There is necrotic tissue and we need to get this cleared away I think the Santyl is probably the best way to do this. Electronic Signature(s) Signed: 04/21/2022 5:17:46 PM By: Worthy Keeler PA-C Entered By: Worthy Keeler on 04/21/2022 17:17:45 [name], [name] ([id number]) -------------------------------------------------------------------------------- Physician Orders Details Patient Name: [name] Date of Service: 04/21/2022 3:45 PM Medical Record Number: [id number] Patient Account Number: [id number] Date of Birth/Sex: [birth year], [name] (56 y.o. F) Treating RN: Carlene Coria Primary Care Provider: SYSTEM, PCP Other Clinician: Massie Kluver Referring Provider: Zonia Kief Treating Provider/Extender: Skipper Cliche in Treatment: 1 Verbal / Phone Orders: No Diagnosis Coding ICD-10 Coding Code Description E11.622 Type 2 diabetes mellitus with other skin ulcer L97.828 Non-pressure chronic ulcer of other part of left lower leg with other specified severity I10 Essential (primary) hypertension N18.6 End stage renal disease Z99.2 Dependence on renal dialysis Follow-up Appointments o Return Appointment in 1 month Bathing/ Shower/  Hygiene o May shower; gently cleanse wound with antibacterial soap, rinse and pat dry prior to dressing wounds Anesthetic (Use 'Patient Medications' Section for Anesthetic Order Entry) o Lidocaine applied to wound bed Wound Treatment Wound #1 - Lower Leg Wound Laterality: Left, Posterior Cleanser: Byram Ancillary Kit - 15 Day Supply (Generic) 1 x Per Day/30 Days Discharge Instructions: Use supplies as instructed; Kit contains: (15) Saline Bullets; (15) 3x3 Gauze; 15 pr Gloves Topical: Santyl Collagenase Ointment, 30 (gm), tube 1 x Per Day/30 Days Discharge Instructions: apply nickel thick to wound bed only Primary Dressing: Gauze 1 x Per Day/30 Days Discharge Instructions: As directed: moistened with saline Secured With: Tegaderm Film 4x4 (in/in) 1 x Per Day/30 Days Discharge Instructions: Apply to wound bed Electronic Signature(s) Signed: 04/21/2022 6:22:21 PM By: Worthy Keeler PA-C Signed: 04/22/2022 11:54:59 AM By: Massie Kluver Entered By: Massie Kluver on 04/21/2022 16:32:46 [name], [name] ([id number]) -------------------------------------------------------------------------------- Problem List Details Patient Name: [name] Date of Service: 04/21/2022 3:45 PM Medical Record Number: [id number] Patient Account Number: [id number] Date of Birth/Sex: [name]-08-[name] (56 y.o. F) Treating RN: Carlene Coria Primary Care Provider: SYSTEM, PCP Other Clinician: Massie Kluver Referring Provider: Zonia Kief Treating Provider/Extender: Skipper Cliche in Treatment: 1 Active Problems ICD-10 Encounter Code Description Active Date MDM Diagnosis E11.622 Type 2 diabetes mellitus with other skin ulcer 04/14/2022 No Yes L97.828 Non-pressure chronic ulcer of other part of left lower leg with other 04/14/2022 No Yes specified severity I10 Essential (primary) hypertension 04/14/2022 No Yes N18.6 End stage renal disease 04/14/2022 No Yes Z99.2 Dependence on renal dialysis 04/14/2022 No  Yes Inactive Problems Resolved Problems Electronic Signature(s) Signed: 04/21/2022 3:51:32 PM By: Worthy Keeler PA-C Entered By: Worthy Keeler on 04/21/2022 15:51:31 [name] ([id number]) -------------------------------------------------------------------------------- Progress Note Details Patient Name: [name] Date of Service: 04/21/2022 3:45 PM Medical Record Number: [id number] Patient Account Number: [id number] Date of Birth/Sex: [name], [name] (55  y.o. F) Treating RN: Carlene Coria Primary Care Provider: SYSTEM, PCP Other Clinician: Massie Kluver Referring Provider: Zonia Kief Treating Provider/Extender: Skipper Cliche in Treatment: 1 Subjective Chief Complaint Information obtained from Patient Ulcer left leg posteriorly History of Present Illness (HPI) 04-14-2022 upon evaluation today patient presents for initial inspection here in the clinic concerning issues that she has been having with the wound over the left posterior lower extremity. This started out as a painful red burning area which she tells me has developed into more of an eschar covered region. 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 With that being said I discussed with her today what exactly that is and what it means. Also discussed that she is going to likely need to be closely monitored to ensure this does not get worse. I did perform a debridement today but I tried to be as careful as possible with this debridement in order to not cause anything to worsen nonetheless we needed to get some of the eschar off. 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 She also has been on doxycycline which was started on 03-27-2022 which has not really made a significant improvement. She is also been on mupirocin topically which is not helping. She has been using some essential  oil which she feels like made it feel little bit better but still this does not seem to be getting better. Overall she has become very frustrated with the situation. 04-21-2022 upon evaluation today patient appears to be doing well currently with regard to her wound. She has been tolerating the dressing changes using just the mupirocin we have been waiting on the Santyl. I [facility] multiple [facility] well ended up having a going through but at the same time she is not able to actually get that currently due to the fact that she has been told that I did not "write the prescription correctly and it will not go through her insurance that way". With that being said I am not exactly sure what could be wrong about it is actually a macro that I used to put the prescription in the dosing is appropriate and I even put the measurements on the prescription for the wound size which is needed as well. With that being said I am going to call the pharmacy today while the patient is here in the clinic to verify what is going on and make sure we get this taken care of. Objective Constitutional Well-nourished and well-hydrated in no acute distress. Vitals Time Taken: 3:57 PM, Height: 67 in, Weight: 164 lbs, BMI: 25.7, Temperature: 98.1 F, Pulse: 92 bpm, Respiratory Rate: 18 breaths/min, Blood Pressure: 159/81 mmHg. Respiratory normal breathing without difficulty. Psychiatric this patient is able to make decisions and demonstrates good insight into disease process. Alert and Oriented x 3. pleasant and cooperative. General Notes: Upon inspection patient's wound bed actually showed signs of good granulation and epithelization at this point. Fortunately I do not see any evidence of active infection locally or systemically at this time which is great news. No fevers, chills, nausea, vomiting, or diarrhea. There is necrotic tissue and we need to get this cleared away I think the Santyl is probably the  best way to do this. Integumentary (Hair, Skin) Wound #1 status is Open. Original cause of wound was Gradually Appeared. The date acquired was:  02/23/2022. The wound has been in treatment 1 weeks. The wound is located on the Left,Posterior Lower Leg. The wound measures 1.5cm length x 1.5cm width x 0.1cm depth; 1.767cm^2 area and 0.177cm^3 volume. There is Fat Layer (Subcutaneous Tissue) exposed. There is a medium amount of sanguinous drainage noted. The wound margin is flat and intact. There is no granulation within the wound bed. There is a large (67-100%) amount of necrotic tissue within the wound bed including Eschar. 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 Essential (primary) hypertension End stage renal disease Dependence on renal dialysis Plan Follow-up Appointments: Return Appointment in 1 month Bathing/ Shower/ Hygiene: May shower; gently cleanse wound with antibacterial soap, rinse and pat dry prior to dressing wounds Anesthetic (Use 'Patient Medications' Section for Anesthetic Order Entry): Lidocaine applied to wound bed WOUND #1: - Lower Leg Wound Laterality: Left, Posterior Cleanser: Byram Ancillary Kit - 15 Day Supply (Generic) 1 x Per Day/30 Days Discharge Instructions: Use supplies as instructed; Kit contains: (15) Saline Bullets; (15) 3x3 Gauze; 15 pr Gloves Topical: Santyl Collagenase Ointment, 30 (gm), tube 1 x Per Day/30 Days Discharge Instructions: apply nickel thick to wound bed only Primary Dressing: Gauze 1 x Per Day/30 Days Discharge Instructions: As directed: moistened with saline Secured With: Tegaderm Film 4x4 (in/in) 1 x Per Day/30 Days Discharge Instructions: Apply to wound bed 1. I did call the pharmacy today and when the pharmacist ran the screws she stated that it went through her insurance fine with  a $20 co-pay. Again I am not sure what was going on with the way this was stated to have been written before and why the patient could not get it yesterday when she got a notification that it arrived in but nonetheless this has been taken care of which is great news. 2. I am also can recommend that we have the patient continue with the saline moistened gauze over top of this. 3. I would recommend she continue with the Tegaderm which does seem to be doing well to protect it that seems to be her favorite way to cover this which I think is just fine. We will see patient back for reevaluation in 3 weeks here in the clinic. If anything worsens or changes patient will contact our office for additional recommendations. Electronic Signature(s) Signed: 04/21/2022 5:18:46 PM By: Worthy Keeler PA-C Entered By: Worthy Keeler on 04/21/2022 17:18:45 [name], [name] ([id number]) -------------------------------------------------------------------------------- SuperBill Details Patient Name: [name] Date of Service: 04/21/2022 Medical Record Number: [id number] Patient Account Number: [id number] Date of Birth/Sex: [birth year] (55 y.o. F) Treating RN: Carlene Coria Primary Care Provider: SYSTEM, PCP Other Clinician: Massie Kluver Referring Provider: Zonia Kief Treating Provider/Extender: Skipper Cliche in Treatment: 1 Diagnosis Coding ICD-10 Codes Code Description E11.622 Type 2 diabetes mellitus with other skin ulcer L97.828 Non-pressure chronic ulcer of other part of left lower leg with other specified severity I10 Essential (primary) hypertension N18.6 End stage renal disease Z99.2 Dependence on renal dialysis Facility Procedures CPT4 Code: [id number] Description: 99213 - WOUND CARE VISIT-LEV 3 EST PT Modifier: Quantity: 1 Physician Procedures CPT4 Code: [id number] Description: 92924 - WC PHYS LEVEL 4 - EST PT Modifier: Quantity: 1 CPT4 Code: Description: ICD-10 Diagnosis Description  E11.622 Type 2 diabetes mellitus with other skin ulcer L97.828 Non-pressure chronic ulcer of other part of left lower leg with other spe I10 Essential (primary)  hypertension N18.6 End stage renal disease Modifier: cified severity Quantity: Electronic Signature(s) Signed: 04/21/2022 5:22:49 PM By: Worthy Keeler PA-C Entered By: Worthy Keeler on 04/21/2022 17:22:48

## 2022-05-11 ENCOUNTER — Encounter: Payer: Medicare Other | Attending: Physician Assistant | Admitting: Physician Assistant

## 2022-05-11 DIAGNOSIS — N186 End stage renal disease: Secondary | ICD-10-CM | POA: Insufficient documentation

## 2022-05-11 DIAGNOSIS — E11622 Type 2 diabetes mellitus with other skin ulcer: Secondary | ICD-10-CM | POA: Diagnosis present

## 2022-05-11 DIAGNOSIS — L97828 Non-pressure chronic ulcer of other part of left lower leg with other specified severity: Secondary | ICD-10-CM | POA: Insufficient documentation

## 2022-05-11 DIAGNOSIS — Z992 Dependence on renal dialysis: Secondary | ICD-10-CM | POA: Diagnosis not present

## 2022-05-11 DIAGNOSIS — I120 Hypertensive chronic kidney disease with stage 5 chronic kidney disease or end stage renal disease: Secondary | ICD-10-CM | POA: Diagnosis not present

## 2022-05-11 DIAGNOSIS — Z96643 Presence of artificial hip joint, bilateral: Secondary | ICD-10-CM | POA: Diagnosis not present

## 2022-05-11 DIAGNOSIS — E1122 Type 2 diabetes mellitus with diabetic chronic kidney disease: Secondary | ICD-10-CM | POA: Insufficient documentation

## 2022-05-11 NOTE — Progress Notes (Signed)
[name] ([id number]) Visit Report for 05/11/2022 Chief Complaint Document Details Patient Name: [name], [name] Date of Service: 05/11/2022 2:45 PM Medical Record Number: [id number] Patient Account Number: [id number] Date of Birth/Sex: 13-Jul-1966 (55 y.o. F) Treating RN: Carlene Coria Primary Care Provider: SYSTEM, PCP Other Clinician: Referring Provider: Zonia Kief Treating Provider/Extender: Skipper Cliche in Treatment: 3 Information Obtained from: Patient Chief Complaint Ulcer left leg posteriorly Electronic Signature(s) Signed: 05/11/2022 3:01:31 PM By: Worthy Keeler PA-C Entered By: Worthy Keeler on 05/11/2022 15:01:31 [name] ([id number]) -------------------------------------------------------------------------------- Debridement Details Patient Name: [name] Date of Service: 05/11/2022 2:45 PM Medical Record Number: [id number] Patient Account Number: [id number] Date of Birth/Sex: Apr 05, 1966 (55 y.o. F) Treating RN: Carlene Coria Primary Care Provider: SYSTEM, PCP Other Clinician: Referring Provider: Zonia Kief Treating Provider/Extender: Skipper Cliche in Treatment: 3 Debridement Performed for Wound #1 Left,Posterior Lower Leg Assessment: Performed By: Physician Tommie Sams., PA-C Debridement Type: Chemical/Enzymatic/Mechanical Agent Used: Santyl Severity of Tissue Pre Debridement: Fat layer exposed Level of Consciousness (Pre- Awake and Alert procedure): Pre-procedure Verification/Time Out Yes - 15:40 Taken: Start Time: 15:40 Pain Control: Lidocaine 4% Topical Solution Instrument: Other : gauze Bleeding: Minimum End Time: 15:41 Procedural Pain: 0 Post Procedural Pain: 0 Response to Treatment: Procedure was tolerated well Level of Consciousness (Post- Awake and Alert procedure): Post Debridement Measurements of Total Wound Length: (cm) 1.5 Width: (cm) 1.3 Depth: (cm) 0.1 Volume: (cm) 0.153 Character of Wound/Ulcer Post  Debridement: Improved Severity of Tissue Post Debridement: Fat layer exposed Post Procedure Diagnosis Same as Pre-procedure Electronic Signature(s) Signed: 05/11/2022 3:46:50 PM By: Carlene Coria RN Signed: 05/12/2022 5:16:25 PM By: Worthy Keeler PA-C Entered By: Carlene Coria on 05/11/2022 15:46:50 [name] ([id number]) -------------------------------------------------------------------------------- HPI Details Patient Name: [name] Date of Service: 05/11/2022 2:45 PM Medical Record Number: [id number] Patient Account Number: [id number] Date of Birth/Sex: [birth year] (56 y.o. F) Treating RN: Carlene Coria Primary Care Provider: SYSTEM, PCP Other Clinician: Referring Provider: Zonia Kief Treating Provider/Extender: Skipper Cliche in Treatment: 3 History of Present Illness HPI Description: 04-14-2022 upon evaluation today patient presents for initial inspection here in the clinic concerning issues that she has been having with the wound over the left posterior lower extremity. This started out as a painful red burning area which she tells me has developed into more of an eschar covered region. 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 With that being said I discussed with her today what exactly that is and what it means. Also discussed that she is going to likely need to be closely monitored to ensure this does not get worse. I did perform a debridement today but I tried to be as careful as possible with this debridement in order to not cause anything to worsen nonetheless we needed to get some of the eschar off. 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 She also has been on doxycycline which was started on 03-27-2022 which has not really made a significant improvement. She is also been on mupirocin topically which is not helping. She has  been using some essential oil which she feels like made it feel little bit better but still this does not seem to be getting better. Overall she has become very frustrated with the situation. 04-21-2022 upon evaluation today patient appears to be doing well currently with regard to her wound. She has been tolerating  the dressing changes using just the mupirocin we have been waiting on the Santyl. I tried multiple places to send this to [facility] as well ended up having a going through but at the same time she is not able to actually get that currently due to the fact that she has been told that I did not "write the prescription correctly and it will not go through her insurance that way". With that being said I am not exactly sure what could be wrong about it is actually a macro that I used to put the prescription in the dosing is appropriate and I even put the measurements on the prescription for the wound size which is needed as well. With that being said I am going to call the pharmacy today while the patient is here in the clinic to verify what is going on and make sure we get this taken care of. 05-11-2022 upon evaluation today patient appears to be doing well with regard to her wound. She is showing signs of improvement a lot of the necrotic tissue is loosening up and she is showing more think good granulation tissue starting to poke through. With that being said I do not see any evidence of active infection locally or systemically at this time. Electronic Signature(s) Signed: 05/11/2022 3:41:17 PM By: Worthy Keeler PA-C Entered By: Worthy Keeler on 05/11/2022 15:41:17 [name] ([id number]) -------------------------------------------------------------------------------- Physical Exam Details Patient Name: [name] Date of Service: 05/11/2022 2:45 PM Medical Record Number: [id number] Patient Account Number: [id number] Date of Birth/Sex: [birth year] (55 y.o. F) Treating RN: Carlene Coria Primary Care Provider: SYSTEM, PCP Other Clinician: Referring Provider: Zonia Kief Treating Provider/Extender: Skipper Cliche in Treatment: 3 Constitutional Well-nourished and well-hydrated in no acute distress. Respiratory normal breathing without difficulty. Psychiatric this patient is able to make decisions and demonstrates good insight into disease process. Alert and Oriented x 3. pleasant and cooperative. Notes Upon inspection patient's wound bed actually showed signs of good granulation and epithelization at this point. Fortunately I do not see any evidence of worsening overall we are using the Santyl in order to help clear this away I think that is doing a good job and to be perfectly honest I think we should try to avoid debridement if all possible due to the fact this may be more of a calciphylaxis type issue I do not want to worsen that. Electronic Signature(s) Signed: 05/11/2022 3:41:44 PM By: Worthy Keeler PA-C Entered By: Worthy Keeler on 05/11/2022 15:41:44 [name] ([id number]) -------------------------------------------------------------------------------- Physician Orders Details Patient Name: [name] Date of Service: 05/11/2022 2:45 PM Medical Record Number: [id number] Patient Account Number: [id number] Date of Birth/Sex: June 15, 1966 (55 y.o. F) Treating RN: Carlene Coria Primary Care Provider: SYSTEM, PCP Other Clinician: Referring Provider: Zonia Kief Treating Provider/Extender: Skipper Cliche in Treatment: 3 Verbal / Phone Orders: No Diagnosis Coding ICD-10 Coding Code Description E11.622 Type 2 diabetes mellitus with other skin ulcer L97.828 Non-pressure chronic ulcer of other part of left lower leg with other specified severity I10 Essential (primary) hypertension N18.6 End stage renal disease Z99.2 Dependence on renal dialysis Follow-up Appointments o Return Appointment in 2 weeks. Bathing/ Shower/ Hygiene o May  shower; gently cleanse wound with antibacterial soap, rinse and pat dry prior to dressing wounds Anesthetic (Use 'Patient Medications' Section for Anesthetic Order Entry) o Lidocaine applied to wound bed Wound Treatment Wound #1 - Lower Leg Wound Laterality: Left, Posterior Cleanser: Byram Ancillary Kit - 15 Day Supply (Generic) 1  x Per Day/30 Days Discharge Instructions: Use supplies as instructed; Kit contains: (15) Saline Bullets; (15) 3x3 Gauze; 15 pr Gloves Topical: Santyl Collagenase Ointment, 30 (gm), tube 1 x Per Day/30 Days Discharge Instructions: apply nickel thick to wound bed only Primary Dressing: Gauze 1 x Per Day/30 Days Discharge Instructions: As directed: moistened with saline Secured With: Tegaderm Film 4x4 (in/in) 1 x Per Day/30 Days Discharge Instructions: Apply to wound bed Electronic Signature(s) Signed: 05/11/2022 3:47:10 PM By: Carlene Coria RN Signed: 05/12/2022 5:16:25 PM By: Worthy Keeler PA-C Entered By: Carlene Coria on 05/11/2022 15:47:10 [name] ([id number]) -------------------------------------------------------------------------------- Problem List Details Patient Name: [name] Date of Service: 05/11/2022 2:45 PM Medical Record Number: [id number] Patient Account Number: [id number] Date of Birth/Sex: 02-16-1966 (56 y.o. F) Treating RN: Carlene Coria Primary Care Provider: SYSTEM, PCP Other Clinician: Referring Provider: Zonia Kief Treating Provider/Extender: Skipper Cliche in Treatment: 3 Active Problems ICD-10 Encounter Code Description Active Date MDM Diagnosis E11.622 Type 2 diabetes mellitus with other skin ulcer 04/14/2022 No Yes L97.828 Non-pressure chronic ulcer of other part of left lower leg with other 04/14/2022 No Yes specified severity I10 Essential (primary) hypertension 04/14/2022 No Yes N18.6 End stage renal disease 04/14/2022 No Yes Z99.2 Dependence on renal dialysis 04/14/2022 No Yes Inactive Problems Resolved  Problems Electronic Signature(s) Signed: 05/11/2022 3:01:27 PM By: Worthy Keeler PA-C Entered By: Worthy Keeler on 05/11/2022 15:01:27 [name] ([id number]) -------------------------------------------------------------------------------- Progress Note Details Patient Name: [name] Date of Service: 05/11/2022 2:45 PM Medical Record Number: [id number] Patient Account Number: [id number] Date of Birth/Sex: January 31, 1966 (55 y.o. F) Treating RN: Carlene Coria Primary Care Provider: SYSTEM, PCP Other Clinician: Referring Provider: Zonia Kief Treating Provider/Extender: Skipper Cliche in Treatment: 3 Subjective Chief Complaint Information obtained from Patient Ulcer left leg posteriorly History of Present Illness (HPI) 04-14-2022 upon evaluation today patient presents for initial inspection here in the clinic concerning issues that she has been having with the wound over the left posterior lower extremity. This started out as a painful red burning area which she tells me has developed into more of an eschar covered region. 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 With that being said I discussed with her today what exactly that is and what it means. Also discussed that she is going to likely need to be closely monitored to ensure this does not get worse. I did perform a debridement today but I tried to be as careful as possible with this debridement in order to not cause anything to worsen nonetheless we needed to get some of the eschar off. 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 She also has been on doxycycline which was started on 03-27-2022 which has not really made a significant improvement. She is also been on mupirocin topically which is not helping. She has been using some essential oil which she feels like made it feel little bit  better but still this does not seem to be getting better. Overall she has become very frustrated with the situation. 04-21-2022 upon evaluation today patient appears to be doing well currently with regard to her wound. She has been tolerating the dressing changes using just the mupirocin we have been waiting on the Santyl. I [facility] multiple [facility] ended up having a going through but at the same time she is not  able to actually get that currently due to the fact that she has been told that I did not "write the prescription correctly and it will not go through her insurance that way". With that being said I am not exactly sure what could be wrong about it is actually a macro that I used to put the prescription in the dosing is appropriate and I even put the measurements on the prescription for the wound size which is needed as well. With that being said I am going to call the pharmacy today while the patient is here in the clinic to verify what is going on and make sure we get this taken care of. 05-11-2022 upon evaluation today patient appears to be doing well with regard to her wound. She is showing signs of improvement a lot of the necrotic tissue is loosening up and she is showing more think good granulation tissue starting to poke through. With that being said I do not see any evidence of active infection locally or systemically at this time. Objective Constitutional Well-nourished and well-hydrated in no acute distress. Vitals Time Taken: 3:04 PM, Height: 67 in, Weight: 164 lbs, BMI: 25.7, Temperature: 98.2 F, Pulse: 91 bpm, Respiratory Rate: 18 breaths/min, Blood Pressure: 169/93 mmHg. Respiratory normal breathing without difficulty. Psychiatric this patient is able to make decisions and demonstrates good insight into disease process. Alert and Oriented x 3. pleasant and cooperative. General Notes: Upon inspection patient's wound bed actually showed signs of good  granulation and epithelization at this point. Fortunately I do not see any evidence of worsening overall we are using the Santyl in order to help clear this away I think that is doing a good job and to be perfectly honest I think we should try to avoid debridement if all possible due to the fact this may be more of a calciphylaxis type issue I do not want to worsen that. Integumentary (Hair, Skin) Wound #1 status is Open. Original cause of wound was Gradually Appeared. The date acquired was: 02/23/2022. The wound has been in treatment 3 weeks. The wound is located on the Left,Posterior Lower Leg. The wound measures 1.5cm length x 1.3cm width x 0.1cm depth; 1.532cm^2 area and 0.153cm^3 volume. There is no tunneling or undermining noted. There is a medium amount of serosanguineous drainage noted. The wound margin is flat and intact. There is small (1-33%) granulation within the wound bed. There is a large (67-100%) amount of necrotic tissue Sigal, Kailey ([id number]) within the wound bed including Eschar and Adherent Slough. 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 Would recommend that we going continue with wound care measures as before and the patient is in agreement with plan. This includes the use of the Santyl. I do believe that is doing a good job. 2. I am also can recommend the patient should continue to monitor for any signs of worsening infection. Obviously if anything changes she should contact the office and let me know. We will see patient back for reevaluation in 2 weeks here in the clinic. If anything worsens or changes patient will contact our office for additional recommendations. Electronic Signature(s) Signed: 05/11/2022 3:42:29 PM By: Worthy Keeler  PA-C Entered By: Worthy Keeler on 05/11/2022 15:42:29 [name] ([id number]) -------------------------------------------------------------------------------- SuperBill Details Patient Name: [name] Date of Service:  05/11/2022 Medical Record Number: [id number] Patient Account Number: [id number] Date of Birth/Sex: 06-Oct-1965 (56 y.o. F) Treating RN: Carlene Coria Primary Care Provider: SYSTEM, PCP Other Clinician: Referring Provider: Zonia Kief Treating Provider/Extender: Skipper Cliche in Treatment: 3 Diagnosis Coding ICD-10 Codes Code Description E11.622 Type 2 diabetes mellitus with other skin ulcer L97.828 Non-pressure chronic ulcer of other part of left lower leg with other specified severity I10 Essential (primary) hypertension N18.6 End stage renal disease Z99.2 Dependence on renal dialysis Facility Procedures CPT4 Code: 86761950 Description: 93267 - DEBRIDE W/O ANES NON SELECT Modifier: Quantity: 1 Physician Procedures CPT4 Code: 1245809 Description: 98338 - WC PHYS LEVEL 4 - EST PT Modifier: Quantity: 1 CPT4 Code: Description: ICD-10 Diagnosis Description E11.622 Type 2 diabetes mellitus with other skin ulcer L97.828 Non-pressure chronic ulcer of other part of left lower leg with other spe I10 Essential (primary) hypertension N18.6 End stage renal disease Modifier: cified severity Quantity: Electronic Signature(s) Signed: 05/11/2022 3:48:09 PM By: Carlene Coria RN Signed: 05/12/2022 5:16:25 PM By: Worthy Keeler PA-C Previous Signature: 05/11/2022 3:45:26 PM Version By: Worthy Keeler PA-C Entered By: Carlene Coria on 05/11/2022 15:48:09

## 2022-05-11 NOTE — Progress Notes (Addendum)
SANTIAGO, GRAF ([id number]) Visit Report for 05/11/2022 Arrival Information Details Patient Name: [name], [name] Date of Service: 05/11/2022 2:45 PM Medical Record Number: [id number] Patient Account Number: [id number] Date of Birth/Sex: [birth year] (56 y.o. F) Treating RN: Carlene Coria Primary Care Sadae Arrazola: SYSTEM, PCP Other Clinician: Referring Timira Bieda: Zonia Kief Treating Wallice Granville/Extender: Skipper Cliche in Treatment: 3 Visit Information History Since Last Visit All ordered tests and consults were completed: No Patient Arrived: Wheel Chair Added or deleted any medications: No Arrival Time: 15:03 Any new allergies or adverse reactions: No Accompanied By: self Had a fall or experienced change in No Transfer Assistance: None activities of daily living that may affect Patient Identification Verified: Yes risk of falls: Secondary Verification Process Completed: Yes Signs or symptoms of abuse/neglect since last visito No Patient Requires Transmission-Based Precautions: No Hospitalized since last visit: No Patient Has Alerts: Yes Implantable device outside of the clinic excluding No Patient Alerts: Diabetic Type II cellular tissue based products placed in the center since last visit: Has Dressing in Place as Prescribed: Yes Pain Present Now: No Electronic Signature(s) Signed: 05/11/2022 4:14:55 PM By: Carlene Coria RN Entered By: Carlene Coria on 05/11/2022 15:04:51 [name] ([id number]) -------------------------------------------------------------------------------- Clinic Level of Care Assessment Details Patient Name: [name] Date of Service: 05/11/2022 2:45 PM Medical Record Number: [id number] Patient Account Number: [id number] Date of Birth/Sex: [birth year] (56 y.o. F) Treating RN: Carlene Coria Primary Care Rosmery Duggin: SYSTEM, PCP Other Clinician: Referring Eleisha Branscomb: Zonia Kief Treating Nikiesha Milford/Extender: Skipper Cliche in Treatment: 3 Clinic Level of Care  Assessment Items TOOL 1 Quantity Score _0  - Use when EandM and Procedure is performed on INITIAL visit 0 ASSESSMENTS - Nursing Assessment / Reassessment _1  - General Physical Exam (combine w/ comprehensive assessment (listed just below) when performed on new 0 pt. evals) _2  - 0 Comprehensive Assessment (HX, ROS, Risk Assessments, Wounds Hx, etc.) ASSESSMENTS - Wound and Skin Assessment / Reassessment _3  - Dermatologic / Skin Assessment (not related to wound area) 0 ASSESSMENTS - Ostomy and/or Continence Assessment and Care _4  - Incontinence Assessment and Management 0 _5  - 0 Ostomy Care Assessment and Management (repouching, etc.) PROCESS - Coordination of Care _6  - Simple Patient / Family Education for ongoing care 0 _7  - 0 Complex (extensive) Patient / Family Education for ongoing care _8  - 0 Staff obtains Programmer, systems, Records, Test Results / Process Orders _9  - 0 Staff telephones HHA, Nursing Homes / Clarify orders / etc _10  - 0 Routine Transfer to another Facility (non-emergent condition) _11  - 0 Routine Hospital Admission (non-emergent condition) _12  - 0 New Admissions / Biomedical engineer / Ordering NPWT, Apligraf, etc. _13  - 0 Emergency Hospital Admission (emergent condition) PROCESS - Special Needs _14  - Pediatric / Minor Patient Management 0 _15  - 0 Isolation Patient Management _16  - 0 Hearing / Language / Visual special needs _17  - 0 Assessment of Community assistance (transportation, D/C planning, etc.) _18  - 0 Additional assistance / Altered mentation _19  - 0 Support Surface(s) Assessment (bed, cushion, seat, etc.) INTERVENTIONS - Miscellaneous _20  - External ear exam 0 _21  - 0 Patient Transfer (multiple staff / Civil Service fast streamer / Similar devices) _22  - 0 Simple Staple / Suture removal (25 or less) _23  - 0 Complex Staple / Suture removal (26 or more) _24  - 0 Hypo/Hyperglycemic Management (do not check if billed separately) _25  - 0 Ankle / Brachial Index (ABI) - do not  check if billed separately Has the patient been seen at the hospital within the last three years: Yes Total Score:  0 Level Of Care: ____ [name] ([id number]) Electronic Signature(s) Signed: 05/11/2022 4:14:55 PM By: Carlene Coria RN Entered By: Carlene Coria on 05/11/2022 15:47:18 [name] ([id number]) -------------------------------------------------------------------------------- Encounter Discharge Information Details Patient Name: [name] Date of Service: 05/11/2022 2:45 PM Medical Record Number: [id number] Patient Account Number: [id number] Date of Birth/Sex: 11/21/1965 (56 y.o. F) Treating RN: Carlene Coria Primary Care Rufina Kimery: SYSTEM, PCP Other Clinician: Referring Dorlene Footman: Zonia Kief Treating Keiandre Cygan/Extender: Skipper Cliche in Treatment: 3 Encounter Discharge Information Items Post Procedure Vitals Discharge Condition: Stable Temperature (F): 98.2 Ambulatory Status: Walker Pulse (bpm): 91 Discharge Destination: Home Respiratory Rate (breaths/min): 18 Transportation: Private Auto Blood Pressure (mmHg): 169/93 Accompanied By: caregiver Schedule Follow-up Appointment: Yes Clinical Summary of Care: Electronic Signature(s) Signed: 05/11/2022 3:49:08 PM By: Carlene Coria RN Entered By: Carlene Coria on 05/11/2022 15:49:08 [name] ([id number]) -------------------------------------------------------------------------------- Lower Extremity Assessment Details Patient Name: [name] Date of Service: 05/11/2022 2:45 PM Medical Record Number: [id number] Patient Account Number: [id number] Date of Birth/Sex: 07-Nov-1965 (55 y.o. F) Treating RN: Carlene Coria Primary Care Cerenity Goshorn: SYSTEM, PCP Other Clinician: Referring Skylar Priest: Zonia Kief Treating Doriana Mazurkiewicz/Extender: Skipper Cliche in Treatment: 3 Electronic Signature(s) Signed: 05/11/2022 4:14:55 PM By: Carlene Coria RN Entered By: Carlene Coria on 05/11/2022 15:14:32 [name]  ([id number]) -------------------------------------------------------------------------------- Multi Wound Chart Details Patient Name: [name] Date of Service: 05/11/2022 2:45 PM Medical Record Number: [id number] Patient Account Number: [id number] Date of Birth/Sex: 09/12/1965 (56 y.o. F) Treating RN: Carlene Coria Primary Care Precious Segall: SYSTEM, PCP Other Clinician: Referring Asiel Chrostowski: Zonia Kief Treating Donise Woodle/Extender: Skipper Cliche in Treatment: 3 Vital Signs Height(in): 67 Pulse(bpm): 91 Weight(lbs): 164 Blood Pressure(mmHg): 169/93 Body Mass Index(BMI): 25.7 Temperature(F): 98.2 Respiratory Rate(breaths/min): 18 Photos: [N/A:N/A] Wound Location: Left, Posterior Lower Leg N/A N/A Wounding Event: Gradually Appeared N/A N/A Primary Etiology: Diabetic Wound/Ulcer of the Lower N/A N/A Extremity Secondary Etiology: Atypical N/A N/A Comorbid History: Anemia, Asthma, Type II Diabetes, N/A N/A Neuropathy Date Acquired: 02/23/2022 N/A N/A Weeks of Treatment: 3 N/A N/A Wound Status: Open N/A N/A Wound Recurrence: No N/A N/A Measurements L x W x D (cm) 1.5x1.3x0.1 N/A N/A Area (cm) : 1.532 N/A N/A Volume (cm) : 0.153 N/A N/A % Reduction in Area: -35.50% N/A N/A % Reduction in Volume: 32.30% N/A N/A Classification: Grade 1 N/A N/A Exudate Amount: Medium N/A N/A Exudate Type: Serosanguineous N/A N/A Exudate Color: red, brown N/A N/A Wound Margin: Flat and Intact N/A N/A Granulation Amount: Small (1-33%) N/A N/A Necrotic Amount: Large (67-100%) N/A N/A Necrotic Tissue: Eschar, Adherent Slough N/A N/A Exposed Structures: Fascia: No N/A N/A Fat Layer (Subcutaneous Tissue): No Tendon: No Muscle: No Joint: No Bone: No Epithelialization: None N/A N/A Treatment Notes Electronic Signature(s) Signed: 05/11/2022 4:14:55 PM By: Carlene Coria RN Entered By: Carlene Coria on 05/11/2022 15:15:47 [name] ([id number]) [name], [name]  ([id number]) -------------------------------------------------------------------------------- Multi-Disciplinary Care Plan Details Patient Name: [name] Date of Service: 05/11/2022 2:45 PM Medical Record Number: [id number] Patient Account Number: [id number] Date of Birth/Sex: Aug 28, 1966 (56 y.o. F) Treating RN: Carlene Coria Primary Care Shaunae Sieloff: SYSTEM, PCP Other Clinician: Referring Teyona Nichelson: Zonia Kief Treating Arsal Tappan/Extender: Skipper Cliche in Treatment: 3 Active Inactive Wound/Skin Impairment Nursing Diagnoses: Impaired tissue integrity Knowledge deficit related to smoking impact on wound healing Knowledge deficit related to ulceration/compromised skin integrity Goals: Patient/caregiver will verbalize understanding of skin care regimen Date Initiated: 04/14/2022 Target Resolution Date: 04/14/2022 Goal Status: Active Ulcer/skin breakdown will have a volume reduction of 30% by week 4 Date Initiated: 04/14/2022 Target Resolution  Date: 05/05/2022 Goal Status: Active Interventions: Assess patient/caregiver ability to obtain necessary supplies Assess patient/caregiver ability to perform ulcer/skin care regimen upon admission and as needed Assess ulceration(s) every visit Treatment Activities: Referred to DME Rhodie Cienfuegos for dressing supplies : 04/14/2022 Skin care regimen initiated : 04/14/2022 Topical wound management initiated : 04/14/2022 Notes: Electronic Signature(s) Signed: 05/11/2022 4:14:55 PM By: Carlene Coria RN Entered By: Carlene Coria on 05/11/2022 15:15:39 [name] ([id number]) -------------------------------------------------------------------------------- Pain Assessment Details Patient Name: [name] Date of Service: 05/11/2022 2:45 PM Medical Record Number: [id number] Patient Account Number: [id number] Date of Birth/Sex: 07/07/1966 (56 y.o. F) Treating RN: Carlene Coria Primary Care Faheem Ziemann: SYSTEM, PCP Other Clinician: Referring Alanny Rivers: Zonia Kief Treating Crosby Oriordan/Extender: Skipper Cliche in Treatment: 3 Active Problems Location of Pain Severity and Description of Pain Patient Has Paino No Site Locations Pain Management and Medication Current Pain Management: Electronic Signature(s) Signed: 05/11/2022 4:14:55 PM By: Carlene Coria RN Entered By: Carlene Coria on 05/11/2022 15:05:19 [name] ([id number]) -------------------------------------------------------------------------------- Patient/Caregiver Education Details Patient Name: [name] Date of Service: 05/11/2022 2:45 PM Medical Record Number: [id number] Patient Account Number: [id number] Date of Birth/Gender: [birth year] (56 y.o. F) Treating RN: Carlene Coria Primary Care Physician: SYSTEM, PCP Other Clinician: Referring Physician: Zonia Kief Treating Physician/Extender: Skipper Cliche in Treatment: 3 Education Assessment Education Provided To: Patient Education Topics Provided Wound/Skin Impairment: Methods: Explain/Verbal Responses: State content correctly Electronic Signature(s) Signed: 05/11/2022 4:14:55 PM By: Carlene Coria RN Entered By: Carlene Coria on 05/11/2022 15:48:21 [name] ([id number]) -------------------------------------------------------------------------------- Wound Assessment Details Patient Name: [name] Date of Service: 05/11/2022 2:45 PM Medical Record Number: [id number] Patient Account Number: [id number] Date of Birth/Sex: 02/11/1966 (55 y.o. F) Treating RN: Carlene Coria Primary Care Tyne Banta: SYSTEM, PCP Other Clinician: Referring Diannie Willner: Zonia Kief Treating Travis Mastel/Extender: Skipper Cliche in Treatment: 3 Wound Status Wound Number: 1 Primary Etiology: Diabetic Wound/Ulcer of the Lower Extremity Wound Location: Left, Posterior Lower Leg Secondary Etiology: Atypical Wounding Event: Gradually Appeared Wound Status: Open Date Acquired: 02/23/2022 Comorbid History: Anemia, Asthma, Type II  Diabetes, Neuropathy Weeks Of Treatment: 3 Clustered Wound: No Photos Wound Measurements Length: (cm) 1.5 Width: (cm) 1.3 Depth: (cm) 0.1 Area: (cm) 1.532 Volume: (cm) 0.153 % Reduction in Area: -35.5% % Reduction in Volume: 32.3% Epithelialization: None Tunneling: No Undermining: No Wound Description Classification: Grade 1 Wound Margin: Flat and Intact Exudate Amount: Medium Exudate Type: Serosanguineous Exudate Color: red, brown Foul Odor After Cleansing: No Slough/Fibrino Yes Wound Bed Granulation Amount: Small (1-33%) Exposed Structure Necrotic Amount: Large (67-100%) Fascia Exposed: No Necrotic Quality: Eschar, Adherent Slough Fat Layer (Subcutaneous Tissue) Exposed: No Tendon Exposed: No Muscle Exposed: No Joint Exposed: No Bone Exposed: No Treatment Notes Wound #1 (Lower Leg) Wound Laterality: Left, Posterior Cleanser [place]ram Ancillary Kit - 15 Day Supply Discharge Instruction: Use supplies as instructed; Kit contains: (15) Saline Bullets; (15) 3x3 Gauze; 15 pr Gloves Peri-Wound Care Shake, Malarie ([id number]) Topical Santyl Collagenase Ointment, 30 (gm), tube Discharge Instruction: apply nickel thick to wound bed only Primary Dressing Gauze Discharge Instruction: As directed: moistened with saline Secondary Dressing Secured With Tegaderm Film 4x4 (in/in) Discharge Instruction: Apply to wound bed Compression Wrap Compression Stockings Add-Ons Electronic Signature(s) Signed: 05/11/2022 4:14:55 PM By: Carlene Coria RN Entered By: Carlene Coria on 05/11/2022 15:12:18 [name] ([id number]) -------------------------------------------------------------------------------- Vitals Details Patient Name: [name] Date of Service: 05/11/2022 2:45 PM Medical Record Number: [id number] Patient Account Number: [id number] Date of Birth/Sex: Jun 20, 1966 (56 y.o. F) Treating RN: Carlene Coria Primary Care Bowen Goyal: SYSTEM, PCP  Other Clinician: Referring Charizma Gardiner:  Zonia Kief Treating Wendel Homeyer/Extender: Skipper Cliche in Treatment: 3 Vital Signs Time Taken: 15:04 Temperature (F): 98.2 Height (in): 67 Pulse (bpm): 91 Weight (lbs): 164 Respiratory Rate (breaths/min): 18 Body Mass Index (BMI): 25.7 Blood Pressure (mmHg): 169/93 Reference Range: 80 - 120 mg / dl Electronic Signature(s) Signed: 05/11/2022 4:14:55 PM By: Carlene Coria RN Entered By: Carlene Coria on 05/11/2022 15:05:12

## 2022-05-25 ENCOUNTER — Encounter: Payer: Medicare Other | Admitting: Physician Assistant

## 2022-05-25 DIAGNOSIS — E11622 Type 2 diabetes mellitus with other skin ulcer: Secondary | ICD-10-CM | POA: Diagnosis not present

## 2022-05-25 NOTE — Progress Notes (Addendum)
SIREEN, HALK ([id number]) Visit Report for 05/25/2022 Arrival Information [name] Patient Name: [name] Date of Service: 05/25/2022 3:45 PM Medical Record Number: [id number] Patient Account Number: [id number] Date of Birth/Sex: 10/16/1965 (56 y.o. F) Treating RN: Cornell Barman Primary Care Yentl Verge: SYSTEM, PCP Other Clinician: Massie Kluver Referring Netta Fodge: Zonia Kief Treating Marleigh Kaylor/Extender: Skipper Cliche in Treatment: 5 Visit Information History Since Last Visit All ordered tests and consults were completed: No Patient Arrived: Wheel Chair Added or deleted any medications: No Arrival Time: 16:17 Any new allergies or adverse reactions: No Transfer Assistance: EasyPivot Patient Lift Had a fall or experienced change in No activities of daily living that may affect Patient Requires Transmission-Based No risk of falls: Precautions: Hospitalized since last visit: No Patient Has Alerts: Yes Pain Present Now: No Patient Alerts: Diabetic Type II Electronic Signature(s) Signed: 05/25/2022 5:09:23 PM By: Massie Kluver Entered By: Massie Kluver on 05/25/2022 16:19:09 [name] ([id number]) -------------------------------------------------------------------------------- Clinic Level of Care Assessment [name] Patient Name: [name] Date of Service: 05/25/2022 3:45 PM Medical Record Number: [id number] Patient Account Number: [id number] Date of Birth/Sex: March 12, 1966 (56 y.o. F) Treating RN: Cornell Barman Primary Care Prairie Stenberg: SYSTEM, PCP Other Clinician: Massie Kluver Referring Jmya Uliano: Zonia Kief Treating Karolyna Bianchini/Extender: Skipper Cliche in Treatment: 5 Clinic Level of Care Assessment Items TOOL 4 Quantity Score []  - Use when only an EandM is performed on FOLLOW-UP visit 0 ASSESSMENTS - Nursing Assessment / Reassessment X - Reassessment of Co-morbidities (includes updates in patient status) 1 10 X- 1 5 Reassessment of Adherence to Treatment  Plan ASSESSMENTS - Wound and Skin Assessment / Reassessment X - Simple Wound Assessment / Reassessment - one wound 1 5 []  - 0 Complex Wound Assessment / Reassessment - multiple wounds []  - 0 Dermatologic / Skin Assessment (not related to wound area) ASSESSMENTS - Focused Assessment []  - Circumferential Edema Measurements - multi extremities 0 []  - 0 Nutritional Assessment / Counseling / Intervention []  - 0 Lower Extremity Assessment (monofilament, tuning fork, pulses) []  - 0 Peripheral Arterial Disease Assessment (using hand held doppler) ASSESSMENTS - Ostomy and/or Continence Assessment and Care []  - Incontinence Assessment and Management 0 []  - 0 Ostomy Care Assessment and Management (repouching, etc.) PROCESS - Coordination of Care X - Simple Patient / Family Education for ongoing care 1 15 []  - 0 Complex (extensive) Patient / Family Education for ongoing care []  - 0 Staff obtains Programmer, systems, Records, Test Results / Process Orders []  - 0 Staff telephones HHA, Nursing Homes / Clarify orders / etc []  - 0 Routine Transfer to another Facility (non-emergent condition) []  - 0 Routine Hospital Admission (non-emergent condition) []  - 0 New Admissions / Biomedical engineer / Ordering NPWT, Apligraf, etc. []  - 0 Emergency Hospital Admission (emergent condition) X- 1 10 Simple Discharge Coordination []  - 0 Complex (extensive) Discharge Coordination PROCESS - Special Needs []  - Pediatric / Minor Patient Management 0 []  - 0 Isolation Patient Management []  - 0 Hearing / Language / Visual special needs []  - 0 Assessment of Community assistance (transportation, D/C planning, etc.) []  - 0 Additional assistance / Altered mentation []  - 0 Support Surface(s) Assessment (bed, cushion, seat, etc.) INTERVENTIONS - Wound Cleansing / Measurement Krakowski, [name] X- 1 5 Simple Wound Cleansing - one wound []  - 0 Complex Wound Cleansing - multiple wounds X- 1 5 Wound  Imaging (photographs - any number of wounds) []  - 0 Wound Tracing (instead of photographs) X- 1 5 Simple Wound Measurement - one wound []  - 0  Complex Wound Measurement - multiple wounds INTERVENTIONS - Wound Dressings $RemoveBeforeD'[]'aARPhMcErEgjqk$  - Small Wound Dressing one or multiple wounds 0 X- 1 15 Medium Wound Dressing one or multiple wounds $RemoveBeforeD'[]'byjBemkphCaJCK$  - 0 Large Wound Dressing one or multiple wounds X- 1 5 Application of Medications - topical $RemoveB'[]'YVEUFbtS$  - 0 Application of Medications - injection INTERVENTIONS - Miscellaneous $RemoveBeforeD'[]'egAJtKYoluWRaO$  - External ear exam 0 $Remo'[]'ZBepy$  - 0 Specimen Collection (cultures, biopsies, blood, body fluids, etc.) $RemoveBefor'[]'oiJbWMNJzupf$  - 0 Specimen(s) / Culture(s) sent or taken to Lab for analysis $RemoveBefo'[]'XZivXxCQMBo$  - 0 Patient Transfer (multiple staff / Civil Service fast streamer / Similar devices) $RemoveBeforeDE'[]'GdvvTPsNaMcMBZV$  - 0 Simple Staple / Suture removal (25 or less) $Remove'[]'yDzJzLk$  - 0 Complex Staple / Suture removal (26 or more) $Remove'[]'hbHPDiJ$  - 0 Hypo / Hyperglycemic Management (close monitor of Blood Glucose) $RemoveBefore'[]'fYXaJoyGdIPHg$  - 0 Ankle / Brachial Index (ABI) - do not check if billed separately X- 1 5 Vital Signs Has the patient been seen at the hospital within the last three years: Yes Total Score: 85 Level Of Care: New/Established - Level 3 Electronic Signature(s) Signed: 05/25/2022 5:09:23 PM By: Massie Kluver Entered By: Massie Kluver on 05/25/2022 16:36:26 [name] ([id number]) -------------------------------------------------------------------------------- Encounter Discharge Information [name] Patient Name: [name] Date of Service: 05/25/2022 3:45 PM Medical Record Number: [id number] Patient Account Number: [id number] Date of Birth/Sex: 06/22/1966 (55 y.o. F) Treating RN: Cornell Barman Primary Care Amoy Steeves: SYSTEM, PCP Other Clinician: Massie Kluver Referring Preslei Blakley: Zonia Kief Treating Aundra Espin/Extender: Skipper Cliche in Treatment: 5 Encounter Discharge Information Items Discharge Condition: Stable Ambulatory Status: Wheelchair Discharge Destination:  Home Transportation: Private Auto Accompanied By: husband Schedule Follow-up Appointment: Yes Clinical Summary of Care: Electronic Signature(s) Signed: 05/25/2022 5:09:23 PM By: Massie Kluver Entered By: Massie Kluver on 05/25/2022 16:44:25 [name], [name] ([id number]) -------------------------------------------------------------------------------- Lower Extremity Assessment [name] Patient Name: [name] Date of Service: 05/25/2022 3:45 PM Medical Record Number: [id number] Patient Account Number: [id number] Date of Birth/Sex: [birth year] (55 y.o. F) Treating RN: Cornell Barman Primary Care Anyiah Coverdale: SYSTEM, PCP Other Clinician: Massie Kluver Referring Patrycja Mumpower: Zonia Kief Treating Chakara Bognar/Extender: Skipper Cliche in Treatment: 5 Electronic Signature(s) Signed: 05/25/2022 5:09:23 PM By: Massie Kluver Signed: 05/25/2022 5:56:59 PM By: Gretta Cool BSN, RN, CWS, Kim RN, BSN Entered By: Massie Kluver on 05/25/2022 16:34:36 [name], [name] ([id number]) -------------------------------------------------------------------------------- Multi Wound Chart [name] Patient Name: [name] Date of Service: 05/25/2022 3:45 PM Medical Record Number: [id number] Patient Account Number: [id number] Date of Birth/Sex: 1966-03-06 (56 y.o. F) Treating RN: Cornell Barman Primary Care Oliviah Agostini: SYSTEM, PCP Other Clinician: Massie Kluver Referring Saharah Sherrow: Zonia Kief Treating Kynlea Blackston/Extender: Skipper Cliche in Treatment: 5 Vital Signs Height(in): 67 Pulse(bpm): 86 Weight(lbs): 164 Blood Pressure(mmHg): 152/79 Body Mass Index(BMI): 25.7 Temperature(F): 98.1 Respiratory Rate(breaths/min): 18 Photos: [N/A:N/A] Wound Location: Left, Posterior Lower Leg N/A N/A Wounding Event: Gradually Appeared N/A N/A Primary Etiology: Diabetic Wound/Ulcer of the Lower N/A N/A Extremity Secondary Etiology: Atypical N/A N/A Comorbid History: Anemia, Asthma, Type II Diabetes, N/A N/A Neuropathy Date  Acquired: 02/23/2022 N/A N/A Weeks of Treatment: 5 N/A N/A Wound Status: Open N/A N/A Wound Recurrence: No N/A N/A Measurements L x W x D (cm) 1.2x1.6x0.1 N/A N/A Area (cm) : 1.508 N/A N/A Volume (cm) : 0.151 N/A N/A % Reduction in Area: -33.30% N/A N/A % Reduction in Volume: 33.20% N/A N/A Classification: Grade 1 N/A N/A Exudate Amount: Medium N/A N/A Exudate Type: Serosanguineous N/A N/A Exudate Color: red, brown N/A N/A Wound Margin: Flat and Intact N/A N/A Granulation Amount: Small (1-33%) N/A N/A Necrotic Amount: Large (67-100%) N/A  N/A Necrotic Tissue: Eschar, Adherent Slough N/A N/A Exposed Structures: Fascia: No N/A N/A Fat Layer (Subcutaneous Tissue): No Tendon: No Muscle: No Joint: No Bone: No Epithelialization: None N/A N/A Treatment Notes Electronic Signature(s) Signed: 05/25/2022 5:09:23 PM By: Massie Kluver Entered By: Massie Kluver on 05/25/2022 16:34:48 [name], [name] ([id number]) [name], [name] ([id number]) -------------------------------------------------------------------------------- Multi-Disciplinary Care Plan [name] Patient Name: [name] Date of Service: 05/25/2022 3:45 PM Medical Record Number: [id number] Patient Account Number: [id number] Date of Birth/Sex: 09-Apr-1966 (56 y.o. F) Treating RN: Cornell Barman Primary Care Kashlyn Salinas: SYSTEM, PCP Other Clinician: Massie Kluver Referring Trypp Heckmann: Zonia Kief Treating Domonik Levario/Extender: Skipper Cliche in Treatment: 5 Active Inactive Wound/Skin Impairment Nursing Diagnoses: Impaired tissue integrity Knowledge deficit related to smoking impact on wound healing Knowledge deficit related to ulceration/compromised skin integrity Goals: Patient/caregiver will verbalize understanding of skin care regimen Date Initiated: 04/14/2022 Target Resolution Date: 04/14/2022 Goal Status: Active Ulcer/skin breakdown will have a volume reduction of 30% by week 4 Date Initiated: 04/14/2022 Target Resolution  Date: 05/05/2022 Goal Status: Active Interventions: Assess patient/caregiver ability to obtain necessary supplies Assess patient/caregiver ability to perform ulcer/skin care regimen upon admission and as needed Assess ulceration(s) every visit Treatment Activities: Referred to DME Lenisha Lacap for dressing supplies : 04/14/2022 Skin care regimen initiated : 04/14/2022 Topical wound management initiated : 04/14/2022 Notes: Electronic Signature(s) Signed: 05/25/2022 5:09:23 PM By: Massie Kluver Signed: 05/25/2022 5:56:59 PM By: Gretta Cool, BSN, RN, CWS, Kim RN, BSN Entered By: Massie Kluver on 05/25/2022 16:34:40 [name] ([id number]) -------------------------------------------------------------------------------- Pain Assessment [name] Patient Name: [name] Date of Service: 05/25/2022 3:45 PM Medical Record Number: [id number] Patient Account Number: [id number] Date of Birth/Sex: October 09, 1965 (56 y.o. F) Treating RN: Cornell Barman Primary Care Hudsyn Barich: SYSTEM, PCP Other Clinician: Massie Kluver Referring Deeksha Cotrell: Zonia Kief Treating Tina Temme/Extender: Skipper Cliche in Treatment: 5 Active Problems Location of Pain Severity and Description of Pain Patient Has Paino No Site Locations Pain Management and Medication Current Pain Management: Electronic Signature(s) Signed: 05/25/2022 5:09:23 PM By: Massie Kluver Signed: 05/25/2022 5:56:59 PM By: Gretta Cool, BSN, RN, CWS, Kim RN, BSN Entered By: Massie Kluver on 05/25/2022 16:22:37 [name], [name] ([id number]) -------------------------------------------------------------------------------- Patient/Caregiver Education [name] Patient Name: [name] Date of Service: 05/25/2022 3:45 PM Medical Record Number: [id number] Patient Account Number: [id number] Date of Birth/Gender: 1966/02/15 (56 y.o. F) Treating RN: Cornell Barman Primary Care Physician: SYSTEM, PCP Other Clinician: Massie Kluver Referring Physician: Zonia Kief Treating  Physician/Extender: Skipper Cliche in Treatment: 5 Education Assessment Education Provided To: Patient Education Topics Provided Wound/Skin Impairment: Handouts: Other: continue wound care as directed Methods: Explain/Verbal Responses: State content correctly Electronic Signature(s) Signed: 05/25/2022 5:09:23 PM By: Massie Kluver Entered By: Massie Kluver on 05/25/2022 16:43:55 [name], [name] ([id number]) -------------------------------------------------------------------------------- Wound Assessment [name] Patient Name: [name] Date of Service: 05/25/2022 3:45 PM Medical Record Number: [id number] Patient Account Number: [id number] Date of Birth/Sex: [birth year] (55 y.o. F) Treating RN: Cornell Barman Primary Care Lonn Im: SYSTEM, PCP Other Clinician: Massie Kluver Referring Amarrion Pastorino: Zonia Kief Treating Lucianne Smestad/Extender: Skipper Cliche in Treatment: 5 Wound Status Wound Number: 1 Primary Etiology: Diabetic Wound/Ulcer of the Lower Extremity Wound Location: Left, Posterior Lower Leg Secondary Etiology: Atypical Wounding Event: Gradually Appeared Wound Status: Open Date Acquired: 02/23/2022 Comorbid History: Anemia, Asthma, Type II Diabetes, Neuropathy Weeks Of Treatment: 5 Clustered Wound: No Photos Wound Measurements Length: (cm) 1.2 Width: (cm) 1.6 Depth: (cm) 0.1 Area: (cm) 1.508 Volume: (cm) 0.151 % Reduction in Area: -33.3% % Reduction in Volume: 33.2% Epithelialization: None Wound Description Classification: Grade 1 Wound  Margin: Flat and Intact Exudate Amount: Medium Exudate Type: Serosanguineous Exudate Color: red, brown Foul Odor After Cleansing: No Slough/Fibrino Yes Wound Bed Granulation Amount: Small (1-33%) Exposed Structure Necrotic Amount: Large (67-100%) Fascia Exposed: No Necrotic Quality: Eschar, Adherent Slough Fat Layer (Subcutaneous Tissue) Exposed: No Tendon Exposed: No Muscle Exposed: No Joint Exposed: No Bone  Exposed: No Treatment Notes Wound #1 (Lower Leg) Wound Laterality: Left, Posterior Cleanser [place] Ancillary Kit - 15 Day Supply Discharge Instruction: Use supplies as instructed; Kit contains: (15) Saline Bullets; (15) 3x3 Gauze; 15 pr Gloves Peri-Wound Care [name], [name] ([id number]) Topical Santyl Collagenase Ointment, 30 (gm), tube Discharge Instruction: apply nickel thick to wound bed only Primary Dressing Gauze Discharge Instruction: As directed: moistened with saline Secondary Dressing Secured With Tegaderm Film 4x4 (in/in) Discharge Instruction: Apply to wound bed Compression Wrap Compression Stockings Add-Ons Electronic Signature(s) Signed: 05/25/2022 5:09:23 PM By: Massie Kluver Signed: 05/25/2022 5:56:59 PM By: Gretta Cool, BSN, RN, CWS, Kim RN, BSN Entered By: Massie Kluver on 05/25/2022 16:30:53 [name] ([id number]) -------------------------------------------------------------------------------- [name] Patient Name: [name] Date of Service: 05/25/2022 3:45 PM Medical Record Number: [id number] Patient Account Number: [id number] Date of Birth/Sex: [birth year] (56 y.o. F) Treating RN: Cornell Barman Primary Care Jamera Vanloan: SYSTEM, PCP Other Clinician: Massie Kluver Referring Sarkis Rhines: Zonia Kief Treating Nela Bascom/Extender: Skipper Cliche in Treatment: 5 Vital Signs Time Taken: 16:22 Temperature (F): 98.1 Height (in): 67 Pulse (bpm): 86 Weight (lbs): 164 Respiratory Rate (breaths/min): 18 Body Mass Index (BMI): 25.7 Blood Pressure (mmHg): 152/79 Reference Range: 80 - 120 mg / dl Electronic Signature(s) Signed: 05/25/2022 5:09:23 PM By: Massie Kluver Entered By: Massie Kluver on 05/25/2022 16:22:32

## 2022-05-25 NOTE — Progress Notes (Addendum)
FELECIA, STANFILL ([id number]) Visit Report for 05/25/2022 Chief Complaint Document Details Patient Name: [name], [name] Date of Service: 05/25/2022 3:45 PM Medical Record Number: [id number] Patient Account Number: [id number] Date of Birth/Sex: 11/15/1965 (56 y.o. F) Treating RN: Cornell Barman Primary Care Provider: SYSTEM, PCP Other Clinician: Massie Kluver Referring Provider: Zonia Kief Treating Provider/Extender: Skipper Cliche in Treatment: 5 Information Obtained from: Patient Chief Complaint Ulcer left leg posteriorly Electronic Signature(s) Signed: 05/25/2022 3:52:09 PM By: Worthy Keeler PA-C Entered By: Worthy Keeler on 05/25/2022 15:52:08 TAE, ROBAK ([id number]) -------------------------------------------------------------------------------- HPI Details Patient Name: [name] Date of Service: 05/25/2022 3:45 PM Medical Record Number: [id number] Patient Account Number: [id number] Date of Birth/Sex: Dec 24, 1965 (56 y.o. F) Treating RN: Cornell Barman Primary Care Provider: SYSTEM, PCP Other Clinician: Massie Kluver Referring Provider: Zonia Kief Treating Provider/Extender: Skipper Cliche in Treatment: 5 History of Present Illness HPI Description: 04-14-2022 upon evaluation today patient presents for initial inspection here in the clinic concerning issues that she has been having with the wound over the left posterior lower extremity. This started out as a painful red burning area which she tells me has developed into more of an eschar covered region. 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 With that being said I discussed with her today what exactly that is and what it means. Also discussed that she is going to likely need to be closely monitored to ensure this does not get worse. I did perform a debridement today but I tried to be as careful as possible with this debridement in order to not cause anything to worsen nonetheless we needed to  get some of the eschar off. 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 She also has been on doxycycline which was started on 03-27-2022 which has not really made a significant improvement. She is also been on mupirocin topically which is not helping. She has been using some essential oil which she feels like made it feel little bit better but still this does not seem to be getting better. Overall she has become very frustrated with the situation. 04-21-2022 upon evaluation today patient appears to be doing well currently with regard to her wound. She has been tolerating the dressing changes using just the mupirocin we have been waiting on the Santyl. I tried multiple places to send this to [facility] as well ended up having a going through but at the same time she is not able to actually get that currently due to the fact that she has been told that I did not "write the prescription correctly and it will not go through her insurance that way". With that being said I am not exactly sure what could be wrong about it is actually a macro that I used to put the prescription in the dosing is appropriate and I even put the measurements on the prescription for the wound size which is needed as well. With that being said I am going to call the pharmacy today while the patient is here in the clinic to verify what is going on and make sure we get this taken care of. 05-11-2022 upon evaluation today patient appears to be doing well with regard to her wound. She is showing signs of improvement a lot of the necrotic tissue is loosening up and she is showing more think good granulation tissue starting to poke through. With  that being said I do not see any evidence of active infection locally or systemically at this time. 05-25-2022 upon evaluation today patient appears to be doing well currently in regard to  her wound. We are actually making signs of good progress. Fortunately I do not see any evidence of active infection which is great. She does have more granulation tissue starting to show itself and I am happy in that regard. I do believe the Santyl however still probably her best bet based on what we are seeing. Electronic Signature(s) Signed: 05/25/2022 4:39:25 PM By: Worthy Keeler PA-C Entered By: Worthy Keeler on 05/25/2022 16:39:25 [name], [name] ([id number]) -------------------------------------------------------------------------------- Physical Exam Details Patient Name: [name] Date of Service: 05/25/2022 3:45 PM Medical Record Number: [id number] Patient Account Number: [id number] Date of Birth/Sex: 11/02/1965 (56 y.o. F) Treating RN: Cornell Barman Primary Care Provider: SYSTEM, PCP Other Clinician: Massie Kluver Referring Provider: Zonia Kief Treating Provider/Extender: Skipper Cliche in Treatment: 5 Constitutional Well-nourished and well-hydrated in no acute distress. Respiratory normal breathing without difficulty. Psychiatric this patient is able to make decisions and demonstrates good insight into disease process. Alert and Oriented x 3. pleasant and cooperative. Notes Patient's wound again is showing signs of clearing up and I think the [facility] is doing a good job here for her. I see no signs of active infection locally or systemically at this time which is great news and overall I do believe we are on the right track. Electronic Signature(s) Signed: 05/25/2022 4:39:41 PM By: Worthy Keeler PA-C Entered By: Worthy Keeler on 05/25/2022 16:39:40 [name], [name] ([id number]) -------------------------------------------------------------------------------- Physician Orders Details Patient Name: [name] Date of Service: 05/25/2022 3:45 PM Medical Record Number: [id number] Patient Account Number: [id number] Date of Birth/Sex: 01-25-1966 (56 y.o. F) Treating RN:  Cornell Barman Primary Care Provider: SYSTEM, PCP Other Clinician: Massie Kluver Referring Provider: Zonia Kief Treating Provider/Extender: Skipper Cliche in Treatment: 5 Verbal / Phone Orders: No Diagnosis Coding ICD-10 Coding Code Description E11.622 Type 2 diabetes mellitus with other skin ulcer L97.828 Non-pressure chronic ulcer of other part of left lower leg with other specified severity I10 Essential (primary) hypertension N18.6 End stage renal disease Z99.2 Dependence on renal dialysis Follow-up Appointments o Return Appointment in 2 weeks. Bathing/ Shower/ Hygiene o May shower; gently cleanse wound with antibacterial soap, rinse and pat dry prior to dressing wounds Anesthetic (Use 'Patient Medications' Section for Anesthetic Order Entry) o Lidocaine applied to wound bed Wound Treatment Wound #1 - Lower Leg Wound Laterality: Left, Posterior Cleanser: Byram Ancillary Kit - 15 Day Supply (Generic) 1 x Per Day/30 Days Discharge Instructions: Use supplies as instructed; Kit contains: (15) Saline Bullets; (15) 3x3 Gauze; 15 pr Gloves Topical: Santyl Collagenase Ointment, 30 (gm), tube 1 x Per Day/30 Days Discharge Instructions: apply nickel thick to wound bed only Primary Dressing: Gauze 1 x Per Day/30 Days Discharge Instructions: As directed: moistened with saline Secured With: Tegaderm Film 4x4 (in/in) 1 x Per Day/30 Days Discharge Instructions: Apply to wound bed Electronic Signature(s) Signed: 05/25/2022 4:44:22 PM By: Worthy Keeler PA-C Signed: 05/25/2022 5:09:23 PM By: Massie Kluver Entered By: Massie Kluver on 05/25/2022 16:35:57 [name], [name] ([id number]) -------------------------------------------------------------------------------- Problem List Details Patient Name: [name] Date of Service: 05/25/2022 3:45 PM Medical Record Number: [id number] Patient Account Number: [id number] Date of Birth/Sex: 02-Mar-1966 (56 y.o. F) Treating RN: Cornell Barman Primary Care Provider: SYSTEM, PCP Other Clinician: Massie Kluver Referring Provider: Zonia Kief Treating Provider/Extender: Skipper Cliche in Treatment: 5  Active Problems ICD-10 Encounter Code Description Active Date MDM Diagnosis E11.622 Type 2 diabetes mellitus with other skin ulcer 04/14/2022 No Yes L97.828 Non-pressure chronic ulcer of other part of left lower leg with other 04/14/2022 No Yes specified severity I10 Essential (primary) hypertension 04/14/2022 No Yes N18.6 End stage renal disease 04/14/2022 No Yes Z99.2 Dependence on renal dialysis 04/14/2022 No Yes Inactive Problems Resolved Problems Electronic Signature(s) Signed: 05/25/2022 3:52:06 PM By: Worthy Keeler PA-C Entered By: Worthy Keeler on 05/25/2022 15:52:06 [name] ([id number]) -------------------------------------------------------------------------------- Progress Note Details Patient Name: [name] Date of Service: 05/25/2022 3:45 PM Medical Record Number: [id number] Patient Account Number: [id number] Date of Birth/Sex: 1966/07/04 (56 y.o. F) Treating RN: Cornell Barman Primary Care Provider: SYSTEM, PCP Other Clinician: Massie Kluver Referring Provider: Zonia Kief Treating Provider/Extender: Skipper Cliche in Treatment: 5 Subjective Chief Complaint Information obtained from Patient Ulcer left leg posteriorly History of Present Illness (HPI) 04-14-2022 upon evaluation today patient presents for initial inspection here in the clinic concerning issues that she has been having with the wound over the left posterior lower extremity. This started out as a painful red burning area which she tells me has developed into more of an eschar covered region. 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 With that being said I discussed with her today what exactly that is and what it means. Also discussed that she is going to likely need to be closely monitored to ensure  this does not get worse. I did perform a debridement today but I tried to be as careful as possible with this debridement in order to not cause anything to worsen nonetheless we needed to get some of the eschar off. 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 She also has been on doxycycline which was started on 03-27-2022 which has not really made a significant improvement. She is also been on mupirocin topically which is not helping. She has been using some essential oil which she feels like made it feel little bit better but still this does not seem to be getting better. Overall she has become very frustrated with the situation. 04-21-2022 upon evaluation today patient appears to be doing well currently with regard to her wound. She has been tolerating the dressing changes using just the mupirocin we have been waiting on the Santyl. I [facility] multiple [facility] ended up having a going through but at the same time she is not able to actually get that currently due to the fact that she has been told that I did not "write the prescription correctly and it will not go through her insurance that way". With that being said I am not exactly sure what could be wrong about it is actually a macro that I used to put the prescription in the dosing is appropriate and I even put the measurements on the prescription for the wound size which is needed as well. With that being said I am going to call the pharmacy today while the patient is here in the clinic to verify what is going on and make sure we get this taken care of. 05-11-2022 upon evaluation today patient appears to be doing well with regard to her wound. She is showing signs of improvement a lot of the necrotic tissue is loosening up and she is showing more think good  granulation tissue starting to poke through. With  that being said I do not see any evidence of active infection locally or systemically at this time. 05-25-2022 upon evaluation today patient appears to be doing well currently in regard to her wound. We are actually making signs of good progress. Fortunately I do not see any evidence of active infection which is great. She does have more granulation tissue starting to show itself and I am happy in that regard. I do believe the Santyl however still probably her best bet based on what we are seeing. Objective Constitutional Well-nourished and well-hydrated in no acute distress. Vitals Time Taken: 4:22 PM, Height: 67 in, Weight: 164 lbs, BMI: 25.7, Temperature: 98.1 F, Pulse: 86 bpm, Respiratory Rate: 18 breaths/min, Blood Pressure: 152/79 mmHg. Respiratory normal breathing without difficulty. Psychiatric this patient is able to make decisions and demonstrates good insight into disease process. Alert and Oriented x 3. pleasant and cooperative. General Notes: Patient's wound again is showing signs of clearing up and I think the [facility] is doing a good job here for her. I see no signs of active infection locally or systemically at this time which is great news and overall I do believe we are on the right track. Integumentary (Hair, Skin) Wound #1 status is Open. Original cause of wound was Gradually Appeared. The date acquired was: 02/23/2022. The wound has been in treatment 5 weeks. The wound is located on the Left,Posterior Lower Leg. The wound measures 1.2cm length x 1.6cm width x 0.1cm depth; 1.508cm^2 [name], [name] ([id number]) area and 0.151cm^3 volume. There is a medium amount of serosanguineous drainage noted. The wound margin is flat and intact. There is small (1-33%) granulation within the wound bed. There is a large (67-100%) amount of necrotic tissue within the wound bed including Eschar and Adherent Slough. Assessment Active Problems ICD-10 Type 2 diabetes mellitus with other skin  ulcer Non-pressure chronic ulcer of other part of left lower leg with other specified severity Essential (primary) hypertension End stage renal disease Dependence on renal dialysis Plan Follow-up Appointments: Return Appointment in 2 weeks. Bathing/ Shower/ Hygiene: May shower; gently cleanse wound with antibacterial soap, rinse and pat dry prior to dressing wounds Anesthetic (Use 'Patient Medications' Section for Anesthetic Order Entry): Lidocaine applied to wound bed WOUND #1: - Lower Leg Wound Laterality: Left, Posterior Cleanser: Byram Ancillary Kit - 15 Day Supply (Generic) 1 x Per Day/30 Days Discharge Instructions: Use supplies as instructed; Kit contains: (15) Saline Bullets; (15) 3x3 Gauze; 15 pr Gloves Topical: Santyl Collagenase Ointment, 30 (gm), tube 1 x Per Day/30 Days Discharge Instructions: apply nickel thick to wound bed only Primary Dressing: Gauze 1 x Per Day/30 Days Discharge Instructions: As directed: moistened with saline Secured With: Tegaderm Film 4x4 (in/in) 1 x Per Day/30 Days Discharge Instructions: Apply to wound bed 1. I am going to recommend that we go ahead and continue with the recommendation for wound care measures as before and the patient is in agreement with the plan. This includes the use of the Santyl which is doing a good job. 2. I am also can recommend that we continue with the gauze to cover followed by Tegaderm to secure in place which seems to have done well as well for her. 3. I am also hopeful that in the next 1 to 2 weeks we should be able to get to the point where we can hopefully switch over to a collagen-based dressing or something of that sort to try to help get  this to heal much more rapidly. The patient is looking forward to that. We will see patient back for reevaluation in 1 week here in the clinic. If anything worsens or changes patient will contact our office for additional recommendations. Electronic Signature(s) Signed:  05/25/2022 4:40:33 PM By: Worthy Keeler PA-C Entered By: Worthy Keeler on 05/25/2022 16:40:33 [name], [name] ([id number]) -------------------------------------------------------------------------------- SuperBill Details Patient Name: [name] Date of Service: 05/25/2022 Medical Record Number: [id number] Patient Account Number: [id number] Date of Birth/Sex: 10-30-1965 (56 y.o. F) Treating RN: Cornell Barman Primary Care Provider: SYSTEM, PCP Other Clinician: Massie Kluver Referring Provider: Zonia Kief Treating Provider/Extender: Skipper Cliche in Treatment: 5 Diagnosis Coding ICD-10 Codes Code Description E11.622 Type 2 diabetes mellitus with other skin ulcer L97.828 Non-pressure chronic ulcer of other part of left lower leg with other specified severity I10 Essential (primary) hypertension N18.6 End stage renal disease Z99.2 Dependence on renal dialysis Facility Procedures CPT4 Code: [id number] Description: 99213 - WOUND CARE VISIT-LEV 3 EST PT Modifier: Quantity: 1 Physician Procedures CPT4 Code: [id number] Description: 68159 - WC PHYS LEVEL 3 - EST PT Modifier: Quantity: 1 CPT4 Code: Description: ICD-10 Diagnosis Description E11.622 Type 2 diabetes mellitus with other skin ulcer L97.828 Non-pressure chronic ulcer of other part of left lower leg with other spe I10 Essential (primary) hypertension N18.6 End stage renal disease Modifier: cified severity Quantity: Electronic Signature(s) Signed: 05/25/2022 4:40:44 PM By: Worthy Keeler PA-C Entered By: Worthy Keeler on 05/25/2022 16:40:44

## 2022-06-08 ENCOUNTER — Encounter: Payer: Medicare Other | Attending: Physician Assistant | Admitting: Physician Assistant

## 2022-06-08 DIAGNOSIS — Z96643 Presence of artificial hip joint, bilateral: Secondary | ICD-10-CM | POA: Diagnosis not present

## 2022-06-08 DIAGNOSIS — Z992 Dependence on renal dialysis: Secondary | ICD-10-CM | POA: Insufficient documentation

## 2022-06-08 DIAGNOSIS — L97828 Non-pressure chronic ulcer of other part of left lower leg with other specified severity: Secondary | ICD-10-CM | POA: Insufficient documentation

## 2022-06-08 DIAGNOSIS — I120 Hypertensive chronic kidney disease with stage 5 chronic kidney disease or end stage renal disease: Secondary | ICD-10-CM | POA: Insufficient documentation

## 2022-06-08 DIAGNOSIS — N186 End stage renal disease: Secondary | ICD-10-CM | POA: Insufficient documentation

## 2022-06-08 DIAGNOSIS — E11622 Type 2 diabetes mellitus with other skin ulcer: Secondary | ICD-10-CM | POA: Insufficient documentation

## 2022-06-08 NOTE — Progress Notes (Signed)
[name], [name] ([id number]) Visit Report for 06/08/2022 Chief Complaint Document Details Patient Name: [name], [name] Date of Service: 06/08/2022 3:45 PM Medical Record Number: [id number] Patient Account Number: [id number] Date of Birth/Sex: 09-27-1965 (56 y.o. F) Treating RN: Carlene Coria Primary Care Provider: SYSTEM, PCP Other Clinician: Massie Kluver Referring Provider: Zonia Kief Treating Provider/Extender: Skipper Cliche in Treatment: 7 Information Obtained from: Patient Chief Complaint Ulcer left leg posteriorly Electronic Signature(s) Signed: 06/08/2022 4:21:32 PM By: Worthy Keeler PA-C Entered By: Worthy Keeler on 06/08/2022 16:21:32 [name], [name] ([id number]) -------------------------------------------------------------------------------- HPI Details Patient Name: [name] Date of Service: 06/08/2022 3:45 PM Medical Record Number: [id number] Patient Account Number: [id number] Date of Birth/Sex: 1966-02-18 (56 y.o. F) Treating RN: Carlene Coria Primary Care Provider: SYSTEM, PCP Other Clinician: Massie Kluver Referring Provider: Zonia Kief Treating Provider/Extender: Skipper Cliche in Treatment: 7 History of Present Illness HPI Description: 04-14-2022 upon evaluation today patient presents for initial inspection here in the clinic concerning issues that she has been having with the wound over the left posterior lower extremity. This started out as a painful red burning area which she tells me has developed into more of an eschar covered region. 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 With that being said I discussed with her today what exactly that is and what it means. Also discussed that she is going to likely need to be closely monitored to ensure this does not get worse. I did perform a debridement today but I tried to be as careful as possible with this debridement in order to not cause anything to worsen nonetheless we needed  to get some of the eschar off. 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 She also has been on doxycycline which was started on 03-27-2022 which has not really made a significant improvement. She is also been on mupirocin topically which is not helping. She has been using some essential oil which she feels like made it feel little bit better but still this does not seem to be getting better. Overall she has become very frustrated with the situation. 04-21-2022 upon evaluation today patient appears to be doing well currently with regard to her wound. She has been tolerating the dressing changes using just the mupirocin we have been waiting on the Santyl. I tried multiple places to send this to [facility] as well ended up having a going through but at the same time she is not able to actually get that currently due to the fact that she has been told that I did not "write the prescription correctly and it will not go through her insurance that way". With that being said I am not exactly sure what could be wrong about it is actually a macro that I used to put the prescription in the dosing is appropriate and I even put the measurements on the prescription for the wound size which is needed as well. With that being said I am going to call the pharmacy today while the patient is here in the clinic to verify what is going on and make sure we get this taken care of. 05-11-2022 upon evaluation today patient appears to be doing well with regard to her wound. She is showing signs of improvement a lot of the necrotic tissue is loosening up and she is showing more think good granulation tissue starting to poke through. With  that being said I do not see any evidence of active infection locally or systemically at this time. 05-25-2022 upon evaluation today patient appears to be doing well currently in regard  to her wound. We are actually making signs of good progress. Fortunately I do not see any evidence of active infection which is great. She does have more granulation tissue starting to show itself and I am happy in that regard. I do believe the Santyl however still probably her best bet based on what we are seeing. 06-08-2022 upon evaluation today patient appears to be doing better in regard to her wound she is actually showing signs of good improvement which is great news and overall I am extremely pleased with where things stand today. There does not appear to be any evidence of active infection locally or systemically at this time which is great news. No fevers, chills, nausea, vomiting, or diarrhea. Electronic Signature(s) Signed: 06/08/2022 4:53:37 PM By: Worthy Keeler PA-C Entered By: Worthy Keeler on 06/08/2022 16:53:37 [name], [name] ([id number]) -------------------------------------------------------------------------------- Physical Exam Details Patient Name: [name] Date of Service: 06/08/2022 3:45 PM Medical Record Number: [id number] Patient Account Number: [id number] Date of Birth/Sex: [birth year] (56 y.o. F) Treating RN: Carlene Coria Primary Care Provider: SYSTEM, PCP Other Clinician: Massie Kluver Referring Provider: Zonia Kief Treating Provider/Extender: Skipper Cliche in Treatment: 7 Constitutional Well-nourished and well-hydrated in no acute distress. Respiratory normal breathing without difficulty. Psychiatric this patient is able to make decisions and demonstrates good insight into disease process. Alert and Oriented x 3. pleasant and cooperative. Notes Upon inspection patient's wound bed showed evidence of improvement although it is very slow still this is much better compared to where she started. Fortunately I do not see any signs of active infection locally or systemically at this time. No fevers, chills, nausea, vomiting, or diarrhea. Electronic  Signature(s) Signed: 06/08/2022 4:56:09 PM By: Worthy Keeler PA-C Entered By: Worthy Keeler on 06/08/2022 16:56:09 [name], [name] ([id number]) -------------------------------------------------------------------------------- Physician Orders Details Patient Name: [name] Date of Service: 06/08/2022 3:45 PM Medical Record Number: [id number] Patient Account Number: [id number] Date of Birth/Sex: 1966/01/27 (55 y.o. F) Treating RN: Carlene Coria Primary Care Provider: SYSTEM, PCP Other Clinician: Massie Kluver Referring Provider: Zonia Kief Treating Provider/Extender: Skipper Cliche in Treatment: 7 Verbal / Phone Orders: No Diagnosis Coding ICD-10 Coding Code Description E11.622 Type 2 diabetes mellitus with other skin ulcer L97.828 Non-pressure chronic ulcer of other part of left lower leg with other specified severity I10 Essential (primary) hypertension N18.6 End stage renal disease Z99.2 Dependence on renal dialysis Follow-up Appointments o Return Appointment in 2 weeks. Bathing/ Shower/ Hygiene o May shower; gently cleanse wound with antibacterial soap, rinse and pat dry prior to dressing wounds Anesthetic (Use 'Patient Medications' Section for Anesthetic Order Entry) o Lidocaine applied to wound bed Wound Treatment Wound #1 - Lower Leg Wound Laterality: Left, Posterior Cleanser: Byram Ancillary Kit - 15 Day Supply (Generic) Every Other Day/30 Days Discharge Instructions: Use supplies as instructed; Kit contains: (15) Saline Bullets; (15) 3x3 Gauze; 15 pr Gloves Primary Dressing: Prisma 4.34 (in) Every Other Day/30 Days Discharge Instructions: Moisten w/normal saline or sterile water; Cover wound as directed. Do not remove from wound bed. Secondary Dressing: telfa non-adherent pad Every Other Day/30 Days Discharge Instructions: cut small piece to fit over wound Secured With: Tegaderm Film 4x4 (in/in) Every Other Day/30 Days Discharge Instructions: Apply to  wound bed Electronic Signature(s) Signed: 06/08/2022 4:48:52 PM By: Massie Kluver Signed: 06/09/2022 6:10:21  PM By: Worthy Keeler PA-C Entered By: Massie Kluver on 06/08/2022 16:40:13 [name] ([id number]) -------------------------------------------------------------------------------- Problem List Details Patient Name: [name] Date of Service: 06/08/2022 3:45 PM Medical Record Number: [id number] Patient Account Number: [id number] Date of Birth/Sex: Aug 08, 1966 (55 y.o. F) Treating RN: Carlene Coria Primary Care Provider: SYSTEM, PCP Other Clinician: Massie Kluver Referring Provider: Zonia Kief Treating Provider/Extender: Skipper Cliche in Treatment: 7 Active Problems ICD-10 Encounter Code Description Active Date MDM Diagnosis E11.622 Type 2 diabetes mellitus with other skin ulcer 04/14/2022 No Yes L97.828 Non-pressure chronic ulcer of other part of left lower leg with other 04/14/2022 No Yes specified severity I10 Essential (primary) hypertension 04/14/2022 No Yes N18.6 End stage renal disease 04/14/2022 No Yes Z99.2 Dependence on renal dialysis 04/14/2022 No Yes Inactive Problems Resolved Problems Electronic Signature(s) Signed: 06/08/2022 4:21:27 PM By: Worthy Keeler PA-C Entered By: Worthy Keeler on 06/08/2022 16:21:27 [name] ([id number]) -------------------------------------------------------------------------------- Progress Note Details Patient Name: [name] Date of Service: 06/08/2022 3:45 PM Medical Record Number: [id number] Patient Account Number: [id number] Date of Birth/Sex: 08/13/1966 (55 y.o. F) Treating RN: Carlene Coria Primary Care Provider: SYSTEM, PCP Other Clinician: Massie Kluver Referring Provider: Zonia Kief Treating Provider/Extender: Skipper Cliche in Treatment: 7 Subjective Chief Complaint Information obtained from Patient Ulcer left leg posteriorly History of Present Illness (HPI) 04-14-2022 upon evaluation  today patient presents for initial inspection here in the clinic concerning issues that she has been having with the wound over the left posterior lower extremity. This started out as a painful red burning area which she tells me has developed into more of an eschar covered region. 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 With that being said I discussed with her today what exactly that is and what it means. Also discussed that she is going to likely need to be closely monitored to ensure this does not get worse. I did perform a debridement today but I tried to be as careful as possible with this debridement in order to not cause anything to worsen nonetheless we needed to get some of the eschar off. 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 She also has been on doxycycline which was started on 03-27-2022 which has not really made a significant improvement. She is also been on mupirocin topically which is not helping. She has been using some essential oil which she feels like made it feel little bit better but still this does not seem to be getting better. Overall she has become very frustrated with the situation. 04-21-2022 upon evaluation today patient appears to be doing well currently with regard to her wound. She has been tolerating the dressing changes using just the mupirocin we have been waiting on the Santyl. I [facility] multiple [facility] ended up having a going through but at the same time she is not able to actually get that currently due to the fact that she has been told that I did not "write the prescription correctly and it will not go through her insurance that way". With that being said I am not exactly sure what could be wrong about it is actually a macro that I used to put the prescription in the dosing is appropriate and  I even put the measurements on the prescription for the wound size which is needed as well. With  that being said I am going to call the pharmacy today while the patient is here in the clinic to verify what is going on and make sure we get this taken care of. 05-11-2022 upon evaluation today patient appears to be doing well with regard to her wound. She is showing signs of improvement a lot of the necrotic tissue is loosening up and she is showing more think good granulation tissue starting to poke through. With that being said I do not see any evidence of active infection locally or systemically at this time. 05-25-2022 upon evaluation today patient appears to be doing well currently in regard to her wound. We are actually making signs of good progress. Fortunately I do not see any evidence of active infection which is great. She does have more granulation tissue starting to show itself and I am happy in that regard. I do believe the Santyl however still probably her best bet based on what we are seeing. 06-08-2022 upon evaluation today patient appears to be doing better in regard to her wound she is actually showing signs of good improvement which is great news and overall I am extremely pleased with where things stand today. There does not appear to be any evidence of active infection locally or systemically at this time which is great news. No fevers, chills, nausea, vomiting, or diarrhea. Objective Constitutional Well-nourished and well-hydrated in no acute distress. Vitals Time Taken: 4:10 PM, Height: 67 in, Weight: 164 lbs, BMI: 25.7, Temperature: 98.2 F, Pulse: 91 bpm, Respiratory Rate: 18 breaths/min, Blood Pressure: 168/95 mmHg. Respiratory normal breathing without difficulty. Psychiatric this patient is able to make decisions and demonstrates good insight into disease process. Alert and Oriented x 3. pleasant and cooperative. General Notes: Upon inspection patient's wound bed showed  evidence of improvement although it is very slow still this is much better compared to where she started. Fortunately I do not see any signs of active infection locally or systemically at this time. No fevers, chills, nausea, vomiting, [name], [name] ([id number]) or diarrhea. Integumentary (Hair, Skin) Wound #1 status is Open. Original cause of wound was Gradually Appeared. The date acquired was: 02/23/2022. The wound has been in treatment 7 weeks. The wound is located on the Left,Posterior Lower Leg. The wound measures 1.1cm length x 1.3cm width x 0.1cm depth; 1.123cm^2 area and 0.112cm^3 volume. There is no tunneling or undermining noted. There is a medium amount of serosanguineous drainage noted. The wound margin is flat and intact. There is small (1-33%) pink, hyper - granulation within the wound bed. There is a large (67-100%) amount of necrotic tissue within the wound bed including Adherent Slough. 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 Bathing/ Shower/ Hygiene: May shower; gently cleanse wound with antibacterial soap, rinse and pat dry prior to dressing wounds Anesthetic (Use 'Patient Medications' Section for Anesthetic Order Entry): Lidocaine applied to wound bed WOUND #1: - Lower Leg Wound Laterality: Left, Posterior Cleanser: Byram Ancillary Kit - 15 Day Supply (Generic) Every Other Day/30 Days Discharge Instructions: Use supplies as instructed; Kit contains: (15) Saline Bullets; (15) 3x3 Gauze; 15 pr Gloves Primary Dressing: Prisma 4.34 (in) Every Other Day/30 Days Discharge Instructions: Moisten w/normal saline or sterile water; Cover wound as directed. Do not remove from wound bed. Secondary Dressing:  telfa non-adherent pad Every Other Day/30 Days Discharge Instructions: cut small piece to fit over wound Secured With: Tegaderm Film 4x4 (in/in) Every Other Day/30 Days Discharge Instructions: Apply to wound bed 1. Based on what I am seeing I am going to go ahead and see about making the switch to silver collagen we will get a give this a shot and see how she does. The patient is in agreement with that plan. 2. I am also can recommend that we have the patient continue to monitor for any signs of worsening or infection. Obviously right now feel like we are really doing quite well. 3. I am going to recommend that if the collagen starts to look like is not doing as well for her she should switch back to the [place] and so I will see her back in follow-up. We will see patient back for reevaluation in 1 week here in the clinic. If anything worsens or changes patient will contact our office for additional recommendations. Electronic Signature(s) Signed: 06/08/2022 4:56:47 PM By: Worthy Keeler PA-C Entered By: Worthy Keeler on 06/08/2022 16:56:47 [name], [name] ([id number]) -------------------------------------------------------------------------------- SuperBill Details Patient Name: [name] Date of Service: 06/08/2022 Medical Record Number: [id number] Patient Account Number: [id number] Date of Birth/Sex: 02/22/1966 (55 y.o. F) Treating RN: Carlene Coria Primary Care Provider: SYSTEM, PCP Other Clinician: Massie Kluver Referring Provider: Zonia Kief Treating Provider/Extender: Skipper Cliche in Treatment: 7 Diagnosis Coding ICD-10 Codes Code Description E11.622 Type 2 diabetes mellitus with other skin ulcer L97.828 Non-pressure chronic ulcer of other part of left lower leg with other specified severity I10 Essential (primary) hypertension N18.6 End stage renal disease Z99.2 Dependence on renal dialysis Facility Procedures CPT4 Code: [id number] Description: 99213 - WOUND CARE  VISIT-LEV 3 EST PT Modifier: Quantity: 1 Physician Procedures CPT4 Code: [id number] Description: 63785 - WC PHYS LEVEL 3 - EST PT Modifier: Quantity: 1 CPT4 Code: Description: ICD-10 Diagnosis Description E11.622 Type 2 diabetes mellitus with other skin ulcer L97.828 Non-pressure chronic ulcer of other part of left lower leg with other spe I10 Essential (primary) hypertension N18.6 End stage renal disease Modifier: cified severity Quantity: Electronic Signature(s) Signed: 06/08/2022 4:57:58 PM By: Worthy Keeler PA-C Previous Signature: 06/08/2022 4:48:52 PM Version By: Massie Kluver Entered By: Worthy Keeler on 06/08/2022 16:57:58

## 2022-06-12 NOTE — Progress Notes (Signed)
AVEREE, HARB ([id number]) Visit Report for 06/08/2022 Arrival Information Details Patient Name: [name], [name] Date of Service: 06/08/2022 3:45 PM Medical Record Number: [id number] Patient Account Number: [id number] Date of Birth/Sex: 06/13/1966 (56 y.o. F) Treating RN: Carlene Coria Primary Care Olman Yono: SYSTEM, PCP Other Clinician: Massie Kluver Referring Raneen Jaffer: Zonia Kief Treating Tayvian Holycross/Extender: Skipper Cliche in Treatment: 7 Visit Information History Since Last Visit All ordered tests and consults were completed: No Patient Arrived: Wheel Chair Added or deleted any medications: No Arrival Time: 16:05 Any new allergies or adverse reactions: No Transfer Assistance: EasyPivot Patient Lift Had a fall or experienced change in No activities of daily living that may affect Patient Requires Transmission-Based No risk of falls: Precautions: Hospitalized since last visit: No Patient Has Alerts: Yes Pain Present Now: No Patient Alerts: Diabetic Type II Electronic Signature(s) Signed: 06/08/2022 4:48:52 PM By: Massie Kluver Entered By: Massie Kluver on 06/08/2022 16:10:08 [name] ([id number]) -------------------------------------------------------------------------------- Clinic Level of Care Assessment Details Patient Name: [name] Date of Service: 06/08/2022 3:45 PM Medical Record Number: [id number] Patient Account Number: [id number] Date of Birth/Sex: 02/11/1966 (55 y.o. F) Treating RN: Carlene Coria Primary Care Zaiyden Strozier: SYSTEM, PCP Other Clinician: Massie Kluver Referring Raley Novicki: Zonia Kief Treating Deonna Krummel/Extender: Skipper Cliche in Treatment: 7 Clinic Level of Care Assessment Items TOOL 4 Quantity Score _0  - Use when only an EandM is performed on FOLLOW-UP visit 0 ASSESSMENTS - Nursing Assessment / Reassessment X - Reassessment of Co-morbidities (includes updates in patient status) 1 10 X- 1 5 Reassessment of Adherence to Treatment  Plan ASSESSMENTS - Wound and Skin Assessment / Reassessment X - Simple Wound Assessment / Reassessment - one wound 1 5 _1  - 0 Complex Wound Assessment / Reassessment - multiple wounds _2  - 0 Dermatologic / Skin Assessment (not related to wound area) ASSESSMENTS - Focused Assessment _3  - Circumferential Edema Measurements - multi extremities 0 _4  - 0 Nutritional Assessment / Counseling / Intervention _5  - 0 Lower Extremity Assessment (monofilament, tuning fork, pulses) _6  - 0 Peripheral Arterial Disease Assessment (using hand held doppler) ASSESSMENTS - Ostomy and/or Continence Assessment and Care _7  - Incontinence Assessment and Management 0 _8  - 0 Ostomy Care Assessment and Management (repouching, etc.) PROCESS - Coordination of Care X - Simple Patient / Family Education for ongoing care 1 15 _9  - 0 Complex (extensive) Patient / Family Education for ongoing care _10  - 0 Staff obtains Programmer, systems, Records, Test Results / Process Orders _11  - 0 Staff telephones HHA, Nursing Homes / Clarify orders / etc _12  - 0 Routine Transfer to another Facility (non-emergent condition) _13  - 0 Routine Hospital Admission (non-emergent condition) _14  - 0 New Admissions / Biomedical engineer / Ordering NPWT, Apligraf, etc. _15  - 0 Emergency Hospital Admission (emergent condition) X- 1 10 Simple Discharge Coordination _16  - 0 Complex (extensive) Discharge Coordination PROCESS - Special Needs _17  - Pediatric / Minor Patient Management 0 _18  - 0 Isolation Patient Management _19  - 0 Hearing / Language / Visual special needs _20  - 0 Assessment of Community assistance (transportation, D/C planning, etc.) _21  - 0 Additional assistance / Altered mentation _22  - 0 Support Surface(s) Assessment (bed, cushion, seat, etc.) INTERVENTIONS - Wound Cleansing / Measurement Halder, Alandra ([id number]) X- 1 5 Simple Wound Cleansing - one wound _23  - 0 Complex Wound Cleansing - multiple wounds X- 1 5 Wound  Imaging (photographs - any number of wounds) _24  - 0 Wound Tracing (instead of photographs) X- 1 5 Simple Wound Measurement - one wound _25  - 0  Complex Wound Measurement - multiple wounds INTERVENTIONS - Wound Dressings _0  - Small Wound Dressing one or multiple wounds 0 X- 1 15 Medium Wound Dressing one or multiple wounds _1  - 0 Large Wound Dressing one or multiple wounds <KZSWFUXNATFTDDUK>_0<\/URKYHCWCBJSEGBTD>_1  - 0 Application of Medications - topical <VOHYWVPXTGGYIRSW>_5<\/IOEVOJJKKXFGHWEX>_9  - 0 Application of Medications - injection INTERVENTIONS - Miscellaneous _4  - External ear exam 0 _5  - 0 Specimen Collection (cultures, biopsies, blood, body fluids, etc.) _6  - 0 Specimen(s) / Culture(s) sent or taken to Lab for analysis _7  - 0 Patient Transfer (multiple staff / Harrel Lemon Lift / Similar devices) _8  - 0 Simple Staple / Suture removal (25 or less) _9  - 0 Complex Staple / Suture removal (26 or more) _10  - 0 Hypo / Hyperglycemic Management (close monitor of Blood Glucose) _11  - 0 Ankle / Brachial Index (ABI) - do not check if billed separately X- 1 5 Vital Signs Has the patient been seen at the hospital within the last three years: Yes Total Score: 80 Level Of Care: New/Established - Level 3 Electronic Signature(s) Signed: 06/08/2022 4:48:52 PM By: Massie Kluver Entered By: Massie Kluver on 06/08/2022 16:40:38 [name] ([id number]) -------------------------------------------------------------------------------- Encounter Discharge Information Details Patient Name: [name] Date of Service: 06/08/2022 3:45 PM Medical Record Number: [id number] Patient Account Number: [id number] Date of Birth/Sex: [birth year] (55 y.o. F) Treating RN: Carlene Coria Primary Care Vivian Neuwirth: SYSTEM, PCP Other Clinician: Massie Kluver Referring Mallorie Norrod: Zonia Kief Treating Khalon Cansler/Extender: Skipper Cliche in Treatment: 7 Encounter Discharge Information Items Discharge Condition: Stable Ambulatory Status: Wheelchair Discharge Destination:  Home Transportation: Private Auto Accompanied By: husband Schedule Follow-up Appointment: Yes Clinical Summary of Care: Electronic Signature(s) Signed: 06/08/2022 4:48:52 PM By: Massie Kluver Entered By: Massie Kluver on 06/08/2022 16:41:48 [name] ([id number]) -------------------------------------------------------------------------------- Lower Extremity Assessment Details Patient Name: [name] Date of Service: 06/08/2022 3:45 PM Medical Record Number: [id number] Patient Account Number: [id number] Date of Birth/Sex: 25-Sep-1965 (55 y.o. F) Treating RN: Carlene Coria Primary Care Zuma Hust: SYSTEM, PCP Other Clinician: Massie Kluver Referring Merleen Picazo: Zonia Kief Treating Zavien Clubb/Extender: Skipper Cliche in Treatment: 7 Electronic Signature(s) Signed: 06/08/2022 4:48:52 PM By: Massie Kluver Signed: 06/12/2022 12:25:01 PM By: Carlene Coria RN Entered By: Massie Kluver on 06/08/2022 16:18:53 [place], [place] ([id number]) -------------------------------------------------------------------------------- Multi Wound Chart Details Patient Name: [name] Date of Service: 06/08/2022 3:45 PM Medical Record Number: [id number] Patient Account Number: [id number] Date of Birth/Sex: 06/19/1966 (56 y.o. F) Treating RN: Carlene Coria Primary Care Ellenor Wisniewski: SYSTEM, PCP Other Clinician: Massie Kluver Referring Skyeler Scalese: Zonia Kief Treating Kruti Horacek/Extender: Skipper Cliche in Treatment: 7 Vital Signs Height(in): 67 Pulse(bpm): 91 Weight(lbs): 164 Blood Pressure(mmHg): 168/95 Body Mass Index(BMI): 25.7 Temperature(F): 98.2 Respiratory Rate(breaths/min): 18 Photos: [N/A:N/A] Wound Location: Left, Posterior Lower Leg N/A N/A Wounding Event: Gradually Appeared N/A N/A Primary Etiology: Diabetic Wound/Ulcer of the Lower N/A N/A Extremity Secondary Etiology: Atypical N/A N/A Comorbid History: Anemia, Asthma, Type II Diabetes, N/A N/A Neuropathy Date Acquired:  02/23/2022 N/A N/A Weeks of Treatment: 7 N/A N/A Wound Status: Open N/A N/A Wound Recurrence: No N/A N/A Measurements L x W x D (cm) 1.1x1.3x0.1 N/A N/A Area (cm) : 1.123 N/A N/A Volume (cm) : 0.112 N/A N/A % Reduction in Area: 0.70% N/A N/A % Reduction in Volume: 50.40% N/A N/A Classification: Grade 1 N/A N/A Exudate Amount: Medium N/A N/A Exudate Type: Serosanguineous N/A N/A Exudate Color: red, brown N/A N/A Wound Margin: Flat and Intact N/A N/A Granulation Amount: Small (1-33%) N/A N/A Granulation Quality: Pink, Hyper-granulation N/A N/A Necrotic Amount: Large (  67-100%) N/A N/A Exposed Structures: Fascia: No N/A N/A Fat Layer (Subcutaneous Tissue): No Tendon: No Muscle: No Joint: No Bone: No Epithelialization: None N/A N/A Treatment Notes Electronic Signature(s) Signed: 06/08/2022 4:48:52 PM By: Massie Kluver Entered By: Massie Kluver on 06/08/2022 [place], [place] ([id number]) Michail Sermon, [place] ([id number]) -------------------------------------------------------------------------------- Multi-Disciplinary Care Plan Details Patient Name: [name] Date of Service: 06/08/2022 3:45 PM Medical Record Number: [id number] Patient Account Number: [id number] Date of Birth/Sex: 03/10/1966 (56 y.o. F) Treating RN: Carlene Coria Primary Care Akesha Uresti: SYSTEM, PCP Other Clinician: Massie Kluver Referring Yarah Fuente: Zonia Kief Treating Matyas Baisley/Extender: Skipper Cliche in Treatment: 7 Active Inactive Wound/Skin Impairment Nursing Diagnoses: Impaired tissue integrity Knowledge deficit related to smoking impact on wound healing Knowledge deficit related to ulceration/compromised skin integrity Goals: Patient/caregiver will verbalize understanding of skin care regimen Date Initiated: 04/14/2022 Target Resolution Date: 04/14/2022 Goal Status: Active Ulcer/skin breakdown will have a volume reduction of 30% by week 4 Date Initiated: 04/14/2022 Target Resolution Date:  05/05/2022 Goal Status: Active Interventions: Assess patient/caregiver ability to obtain necessary supplies Assess patient/caregiver ability to perform ulcer/skin care regimen upon admission and as needed Assess ulceration(s) every visit Treatment Activities: Referred to DME Theoplis Garciagarcia for dressing supplies : 04/14/2022 Skin care regimen initiated : 04/14/2022 Topical wound management initiated : 04/14/2022 Notes: Electronic Signature(s) Signed: 06/08/2022 4:48:52 PM By: Massie Kluver Signed: 06/12/2022 12:25:01 PM By: Carlene Coria RN Entered By: Massie Kluver on 06/08/2022 16:18:58 Moseley, Kayela ([id number]) -------------------------------------------------------------------------------- Pain Assessment Details Patient Name: [name] Date of Service: 06/08/2022 3:45 PM Medical Record Number: [id number] Patient Account Number: [id number] Date of Birth/Sex: 24-Aug-1966 (56 y.o. F) Treating RN: Carlene Coria Primary Care Frimy Uffelman: SYSTEM, PCP Other Clinician: Massie Kluver Referring Alexus Galka: Zonia Kief Treating Asyria Kolander/Extender: Skipper Cliche in Treatment: 7 Active Problems Location of Pain Severity and Description of Pain Patient Has Paino No Site Locations Pain Management and Medication Current Pain Management: Electronic Signature(s) Signed: 06/08/2022 4:48:52 PM By: Massie Kluver Signed: 06/12/2022 12:25:01 PM By: Carlene Coria RN Entered By: Massie Kluver on 06/08/2022 16:12:40 [name] ([id number]) -------------------------------------------------------------------------------- Patient/Caregiver Education Details Patient Name: [name] Date of Service: 06/08/2022 3:45 PM Medical Record Number: [id number] Patient Account Number: [id number] Date of Birth/Gender: [birth year] (56 y.o. F) Treating RN: Carlene Coria Primary Care Physician: SYSTEM, PCP Other Clinician: Massie Kluver Referring Physician: Zonia Kief Treating Physician/Extender: Skipper Cliche in Treatment: 7 Education Assessment Education Provided To: Patient Education Topics Provided Wound/Skin Impairment: Handouts: Other: continue wound care as directed Methods: Explain/Verbal Responses: State content correctly Electronic Signature(s) Signed: 06/08/2022 4:48:52 PM By: Massie Kluver Entered By: Massie Kluver on 06/08/2022 [place], [place] ([id number]) -------------------------------------------------------------------------------- Wound Assessment Details Patient Name: [name] Date of Service: 06/08/2022 3:45 PM Medical Record Number: [id number] Patient Account Number: [id number] Date of Birth/Sex: [birth year] (55 y.o. F) Treating RN: Carlene Coria Primary Care Roselinda Bahena: SYSTEM, PCP Other Clinician: Massie Kluver Referring Oveda Dadamo: Zonia Kief Treating Terrell Shimko/Extender: Skipper Cliche in Treatment: 7 Wound Status Wound Number: 1 Primary Etiology: Diabetic Wound/Ulcer of the Lower Extremity Wound Location: Left, Posterior Lower Leg Secondary Etiology: Atypical Wounding Event: Gradually Appeared Wound Status: Open Date Acquired: 02/23/2022 Comorbid History: Anemia, Asthma, Type II Diabetes, Neuropathy Weeks Of Treatment: 7 Clustered Wound: No Photos Wound Measurements Length: (cm) 1.1 Width: (cm) 1.3 Depth: (cm) 0.1 Area: (cm) 1.123 Volume: (cm) 0.112 % Reduction in Area: 0.7% % Reduction in Volume: 50.4% Epithelialization: None Tunneling: No Undermining: No Wound Description Classification: Grade 1 Wound Margin: Flat and Intact Exudate Amount: Medium Exudate Type:  Serosanguineous Exudate Color: red, brown Foul Odor After Cleansing: No Slough/Fibrino Yes Wound Bed Granulation Amount: Small (1-33%) Exposed Structure Granulation Quality: Pink, Hyper-granulation Fascia Exposed: No Necrotic Amount: Large (67-100%) Fat Layer (Subcutaneous Tissue) Exposed: No Necrotic Quality: Adherent Slough Tendon Exposed: No Muscle  Exposed: No Joint Exposed: No Bone Exposed: No Treatment Notes Wound #1 (Lower Leg) Wound Laterality: Left, Posterior Cleanser [place] Ancillary Kit - 15 Day Supply Discharge Instruction: Use supplies as instructed; Kit contains: (15) Saline Bullets; (15) 3x3 Gauze; 15 pr Gloves Peri-Wound Care Daniely, Chemika ([id number]) Topical Primary Dressing Prisma 4.34 (in) Discharge Instruction: Moisten w/normal saline or sterile water; Cover wound as directed. Do not remove from wound bed. Secondary Dressing telfa non-adherent pad Discharge Instruction: cut small piece to fit over wound Secured With Tegaderm Film 4x4 (in/in) Discharge Instruction: Apply to wound bed Compression Wrap Compression Stockings Add-Ons Electronic Signature(s) Signed: 06/08/2022 4:48:52 PM By: Massie Kluver Signed: 06/12/2022 12:25:01 PM By: Carlene Coria RN Entered By: Massie Kluver on 06/08/2022 16:18:41 [place], [place] ([id number]) -------------------------------------------------------------------------------- Vitals Details Patient Name: [name] Date of Service: 06/08/2022 3:45 PM Medical Record Number: [id number] Patient Account Number: [id number] Date of Birth/Sex: 1966/04/09 (56 y.o. F) Treating RN: Carlene Coria Primary Care Artis Beggs: SYSTEM, PCP Other Clinician: Massie Kluver Referring Ollin Hochmuth: Zonia Kief Treating Oluwanifemi Petitti/Extender: Skipper Cliche in Treatment: 7 Vital Signs Time Taken: 16:10 Temperature (F): 98.2 Height (in): 67 Pulse (bpm): 91 Weight (lbs): 164 Respiratory Rate (breaths/min): 18 Body Mass Index (BMI): 25.7 Blood Pressure (mmHg): 168/95 Reference Range: 80 - 120 mg / dl Electronic Signature(s) Signed: 06/08/2022 4:48:52 PM By: Massie Kluver Entered By: Massie Kluver on 06/08/2022 16:12:33

## 2022-06-22 ENCOUNTER — Ambulatory Visit: Payer: BC Managed Care – PPO | Admitting: Physician Assistant

## 2022-06-29 ENCOUNTER — Encounter: Payer: Medicare Other | Admitting: Physician Assistant

## 2022-06-29 DIAGNOSIS — E11622 Type 2 diabetes mellitus with other skin ulcer: Secondary | ICD-10-CM | POA: Diagnosis not present

## 2022-06-29 NOTE — Progress Notes (Addendum)
EZZIE, SENAT ([id number]) [id number] Page 1 of 6 Visit Report for 06/29/2022 Chief Complaint Document Details Patient Name: Date of Service: [name], [name] 06/29/2022 3:15 PM Medical Record Number: [id number] Patient Account Number: [id number] Date of Birth/Sex: Treating RN: 24-Oct-1965 (56 y.o. Drema Pry Primary Care Provider: SYSTEM, PCP Other Clinician: Referring Provider: Treating Provider/Extender: Vladimir Faster in Treatment: 10 Information Obtained from: Patient Chief Complaint Ulcer left leg posteriorly Electronic Signature(s) Signed: 06/29/2022 3:27:36 PM By: Worthy Keeler PA-C Entered By: Worthy Keeler on 06/29/2022 15:27:36 -------------------------------------------------------------------------------- HPI Details Patient Name: Date of Service: [name], [name] 06/29/2022 3:15 PM Medical Record Number: [id number] Patient Account Number: [id number] Date of Birth/Sex: Treating RN: 22-May-1966 (56 y.o. Drema Pry Primary Care Provider: SYSTEM, PCP Other Clinician: Referring Provider: Treating Provider/Extender: Vladimir Faster in Treatment: 10 History of Present Illness HPI Description: 04-14-2022 upon evaluation today patient presents for initial inspection here in the clinic concerning issues that she has been having with the wound over the left posterior lower extremity. This started out as a painful red burning area which she tells me has developed into more of an eschar covered region. 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 With that being said I discussed with her today what exactly that is and what it means. Also discussed that she is going to likely need to be closely monitored to ensure this does not get worse. I did perform a debridement today but I tried to be as careful as possible with this debridement in order to not cause anything to worsen nonetheless we  needed to get some of the eschar off. 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 She also has been on doxycycline which was started on 03-27-2022 which has not really made a significant improvement. She is also been on mupirocin topically which is not helping. She has been using some essential oil which she feels like made it feel little bit better but still this does not seem to be getting better. Overall she has become very frustrated with the situation. 04-21-2022 upon evaluation today patient appears to be doing well currently with regard to her wound. She has been tolerating the dressing changes using just the mupirocin we have been waiting on the Santyl. I tried multiple places to send [facility] as well ended up having a going through but at the same time she is not able to actually get that currently due to the fact that she has been told that I did not "write the prescription correctly and it will not go through her insurance that way". With that being said I am not exactly sure what could be wrong about it is actually a macro that I used to put the prescription in the dosing is appropriate and I even put the measurements on the prescription for the wound size which is needed as well. With that being said I am going to call the pharmacy today while the patient is here in the clinic to verify what is going on and make sure we get this taken care of. [name], [name] ([id number]) [id number] Page 2 of 6 05-11-2022 upon evaluation today patient appears to be doing well with regard to her wound. She is showing signs of improvement a lot of the necrotic tissue is loosening up and she is showing more think  good granulation tissue starting to poke through. With that being said I do not see any evidence of active infection locally or systemically at this  time. 05-25-2022 upon evaluation today patient appears to be doing well currently in regard to her wound. We are actually making signs of good progress. Fortunately I do not see any evidence of active infection which is great. She does have more granulation tissue starting to show itself and I am happy in that regard. I do believe the Santyl however still probably her best bet based on what we are seeing. 06-08-2022 upon evaluation today patient appears to be doing better in regard to her wound she is actually showing signs of good improvement which is great news and overall I am extremely pleased with where things stand today. There does not appear to be any evidence of active infection locally or systemically at this time which is great news. No fevers, chills, nausea, vomiting, or diarrhea. 06-29-2022 upon evaluation today patient appears to be doing well currently in regard to her wound. This actually has some granulation budding but also is very slow to heal. She has been using the collagen although she did run out and went back to the [place] after she ran out of the [place]. Nonetheless she tells me it was burning some although she was using some colloidal silver. That was being used instead of the saline and she is not sure if that coupled with the silver in the Prisma was too much or what. Electronic Signature(s) Signed: 06/29/2022 4:02:29 PM By: Worthy Keeler PA-C Entered By: Worthy Keeler on 06/29/2022 16:02:28 -------------------------------------------------------------------------------- Physical Exam Details Patient Name: Date of Service: [name], [name] 06/29/2022 3:15 PM Medical Record Number: [id number] Patient Account Number: [id number] Date of Birth/Sex: Treating RN: 1966-02-21 (56 y.o. Drema Pry Primary Care Provider: SYSTEM, PCP Other Clinician: Referring Provider: Treating Provider/Extender: Vladimir Faster in Treatment:  10 Constitutional Well-nourished and well-hydrated in no acute distress. Respiratory normal breathing without difficulty. Psychiatric this patient is able to make decisions and demonstrates good insight into disease process. Alert and Oriented x 3. pleasant and cooperative. Notes Upon inspection patient's wound bed actually showed signs again of some granulation budding there was really no debridement undertaken today due to the significant pain that she has she really does not want to much in the way of debridement. Obviously if she needs to go back to the [place] she can but right now I really think this looks like the collagen would probably do best for her based on what I see. Electronic Signature(s) Signed: 06/29/2022 4:02:57 PM By: Worthy Keeler PA-C Entered By: Worthy Keeler on 06/29/2022 16:02:57 [name] ([id number]) [id number] Page 3 of 6 -------------------------------------------------------------------------------- Physician Orders Details Patient Name: Date of Service: [name], [name] 06/29/2022 3:15 PM Medical Record Number: [id number] Patient Account Number: [id number] Date of Birth/Sex: Treating RN: 1965/11/28 (56 y.o. Drema Pry Primary Care Provider: SYSTEM, PCP Other Clinician: Referring Provider: Treating Provider/Extender: Vladimir Faster in Treatment: 10 Verbal / Phone Orders: No Diagnosis Coding ICD-10 Coding Code Description E11.622 Type 2 diabetes mellitus with other skin ulcer L97.828 Non-pressure chronic ulcer of other part of left lower leg with other specified severity I10 Essential (primary) hypertension N18.6 End stage renal disease Z99.2 Dependence on renal dialysis Follow-up Appointments Return Appointment in 2 weeks. Bathing/ Shower/ Hygiene May shower; gently cleanse wound with antibacterial soap, rinse and pat dry prior to dressing wounds Anesthetic (Use '  Patient Medications' Section  for Anesthetic Order Entry) Lidocaine applied to wound bed Wound Treatment Wound #1 - Lower Leg Wound Laterality: Left, Posterior Cleanser: [place] Ancillary Kit - 15 Day Supply (Generic) Every Other Day/30 Days Discharge Instructions: Use supplies as instructed; Kit contains: (15) Saline Bullets; (15) 3x3 Gauze; 15 pr Gloves Prim Dressing: Prisma 4.34 (in) Every Other Day/30 Days ary Discharge Instructions: Moisten w/normal saline or sterile water; Cover wound as directed. Do not remove from wound bed. Secondary Dressing: telfa non-adherent pad Every Other Day/30 Days Discharge Instructions: cut small piece to fit over wound Secured With: Tegaderm Film 4x4 (in/in) Every Other Day/30 Days Discharge Instructions: Apply to wound bed Electronic Signature(s) Signed: 06/29/2022 4:15:39 PM By: Rosalio Loud MSN RN CNS WTA Signed: 06/29/2022 4:57:47 PM By: Worthy Keeler PA-C Entered By: Rosalio Loud on 06/29/2022 16:05:41 -------------------------------------------------------------------------------- Problem List Details Patient Name: Date of Service: [name] 06/29/2022 3:15 PM Medical Record Number: [id number] Patient Account Number: [id number] Date of Birth/Sex: Treating RN: 11/08/1965 (56 y.o. Drema Pry Primary Care Provider: SYSTEM, PCP Other Clinician: Referring Provider: Treating Provider/Extender: Vladimir Faster in Treatment: [place] ([id number]) [id number] Page 4 of 6 Active Problems ICD-10 Encounter Code Description Active Date MDM Diagnosis E11.622 Type 2 diabetes mellitus with other skin ulcer 04/14/2022 No Yes L97.828 Non-pressure chronic ulcer of other part of left lower leg with other specified 04/14/2022 No Yes severity I10 Essential (primary) hypertension 04/14/2022 No Yes N18.6 End stage renal disease 04/14/2022 No Yes Z99.2 Dependence on renal dialysis 04/14/2022 No Yes Inactive Problems Resolved  Problems Electronic Signature(s) Signed: 06/29/2022 3:27:33 PM By: Worthy Keeler PA-C Entered By: Worthy Keeler on 06/29/2022 15:27:33 -------------------------------------------------------------------------------- Progress Note Details Patient Name: Date of Service: [name], [name] 06/29/2022 3:15 PM Medical Record Number: [id number] Patient Account Number: [id number] Date of Birth/Sex: Treating RN: 08-Apr-1966 (56 y.o. Drema Pry Primary Care Provider: SYSTEM, PCP Other Clinician: Referring Provider: Treating Provider/Extender: Vladimir Faster in Treatment: 10 Subjective Chief Complaint Information obtained from Patient Ulcer left leg posteriorly History of Present Illness (HPI) 04-14-2022 upon evaluation today patient presents for initial inspection here in the clinic concerning issues that she has been having with the wound over the left posterior lower extremity. This started out as a painful red burning area which she tells me has developed into more of an eschar covered region. 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 With that being said I discussed with her today what exactly that is and what it means. Also discussed that she is going to likely need to be closely monitored to ensure this does not get worse. I did perform a debridement today but I tried to be as careful as possible with this debridement in order to not cause anything to worsen nonetheless we needed to get some of the eschar off. 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 She also has been on doxycycline which was started on 03-27-2022 which has not really made a significant improvement. She is also been on mupirocin topically which is not helping. She has been using some essential oil which she feels like made it feel little bit better but still this  does not seem to be getting better. Overall she has become very frustrated with the situation. 04-21-2022 upon evaluation today patient appears to be doing well  currently with regard to her wound. She has been tolerating the dressing changes using just [name], [name] ([id number]) [id number] Page 5 of 6 the mupirocin we have been waiting on the Santyl. I [facility] ended up having a going through but at the same time she is not able to actually get that currently due to the fact that she has been told that I did not "write the prescription correctly and it will not go through her insurance that way". With that being said I am not exactly sure what could be wrong about it is actually a macro that I used to put the prescription in the dosing is appropriate and I even put the measurements on the prescription for the wound size which is needed as well. With that being said I am going to call the pharmacy today while the patient is here in the clinic to verify what is going on and make sure we get this taken care of. 05-11-2022 upon evaluation today patient appears to be doing well with regard to her wound. She is showing signs of improvement a lot of the necrotic tissue is loosening up and she is showing more think good granulation tissue starting to poke through. With that being said I do not see any evidence of active infection locally or systemically at this time. 05-25-2022 upon evaluation today patient appears to be doing well currently in regard to her wound. We are actually making signs of good progress. Fortunately I do not see any evidence of active infection which is great. She does have more granulation tissue starting to show itself and I am happy in that regard. I do believe the Santyl however still probably her best bet based on what we are seeing. 06-08-2022 upon evaluation today patient appears to be doing better in regard to her  wound she is actually showing signs of good improvement which is great news and overall I am extremely pleased with where things stand today. There does not appear to be any evidence of active infection locally or systemically at this time which is great news. No fevers, chills, nausea, vomiting, or diarrhea. 06-29-2022 upon evaluation today patient appears to be doing well currently in regard to her wound. This actually has some granulation budding but also is very slow to heal. She has been using the collagen although she did run out and went back to the [place] after she ran out of the [place]. Nonetheless she tells me it was burning some although she was using some colloidal silver. That was being used instead of the saline and she is not sure if that coupled with the silver in the Prisma was too much or what. Objective Constitutional Well-nourished and well-hydrated in no acute distress. Vitals Time Taken: 3:41 PM, Height: 67 in, Weight: 164 lbs, BMI: 25.7, Temperature: 98.3 F, Pulse: 88 bpm, Respiratory Rate: 18 breaths/min, Blood Pressure: 157/82 mmHg. Respiratory normal breathing without difficulty. Psychiatric this patient is able to make decisions and demonstrates good insight into disease process. Alert and Oriented x 3. pleasant and cooperative. General Notes: Upon inspection patient's wound bed actually showed signs again of some granulation budding there was really no debridement undertaken today due to the significant pain that she has she really does not want to much in the way of debridement. Obviously if she needs to go back to the [place] she can but right now I really think this looks like the collagen would probably  do best for her based on what I see. Integumentary (Hair, Skin) Wound #1 status is Open. Original cause of wound was Gradually Appeared. The date acquired was: 02/23/2022. The wound has been in treatment 10 weeks. The wound is located on the Left,Posterior Lower  Leg. The wound measures 1cm length x 1.3cm width x 0.1cm depth; 1.021cm^2 area and 0.102cm^3 volume. There is Fat Layer (Subcutaneous Tissue) exposed. There is a medium amount of serosanguineous drainage noted. The wound margin is flat and intact. There is small (1-33%) pink, hyper - granulation within the wound bed. There is a large (67-100%) amount of necrotic tissue within the wound bed including Adherent Slough. 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 I am good recommend that we have the patient continue to monitor for any signs of worsening or infection. Obviously based on what I am seeing right now I do believe that we are headed in the right direction just slowly. 2. I am also can recommend that she continue to monitor for any additional slough buildup if that occurs she can go back to the Santyl instead of the collagen. 3 we are going initiating treatment with the plain collagen no silver and then she will use saline to moisten. If she needs to she can always add the colloidal silver that she has at home and go from there. We will see patient back for reevaluation in 2 weeks here in the clinic. If anything worsens or changes patient will contact our office for additional recommendations. [name], [name] ([id number]) [id number] Page 6 of 6 Electronic Signature(s) Signed: 06/29/2022 4:03:34 PM By: Worthy Keeler PA-C Entered By: Worthy Keeler on 06/29/2022 16:03:33 -------------------------------------------------------------------------------- SuperBill Details Patient Name: Date of Service: [name], [name] 06/29/2022 Medical Record Number: [id number] Patient Account Number: [id number] Date of Birth/Sex: Treating RN: Jan 09, 1966 (56  y.o. Drema Pry Primary Care Provider: SYSTEM, PCP Other Clinician: Referring Provider: Treating Provider/Extender: Kathrynn Running Weeks in Treatment: 10 Diagnosis Coding ICD-10 Codes Code Description E11.622 Type 2 diabetes mellitus with other skin ulcer L97.828 Non-pressure chronic ulcer of other part of left lower leg with other specified severity I10 Essential (primary) hypertension N18.6 End stage renal disease Z99.2 Dependence on renal dialysis Physician Procedures : CPT4 Code Description Modifier [id number] - WC PHYS LEVEL 3 - EST PT ICD-10 Diagnosis Description E11.622 Type 2 diabetes mellitus with other skin ulcer L97.828 Non-pressure chronic ulcer of other part of left lower leg with other specified severity  I10 Essential (primary) hypertension N18.6 End stage renal disease Quantity: 1 Electronic Signature(s) Signed: 06/29/2022 4:15:39 PM By: Rosalio Loud MSN RN CNS WTA Signed: 06/29/2022 4:57:47 PM By: Worthy Keeler PA-C Previous Signature: 06/29/2022 4:04:17 PM Version By: Worthy Keeler PA-C Entered By: Rosalio Loud on 06/29/2022 16:06:31

## 2022-06-29 NOTE — Progress Notes (Addendum)
MAYMIE, BRUNKE ([id number]) [id number] Page 1 of 9 Visit Report for 10/[name]/2023 Arrival Information Details Patient Name: Date of Service: [name], [name] 10/[name]/2023 3:15 PM Medical Record Number: [id number] Patient Account Number: [id number] Date of Birth/Sex: Treating RN: [birth year]-[name] (56 y.o. [name] Primary Care Aydee Mcnew: SYSTEM, PCP Other Clinician: Referring Delaine Hernandez: Treating Lakea Mittelman/Extender: [name] in Treatment: 10 Visit Information History Since Last Visit Added or deleted any medications: No Patient Arrived: Wheel Chair Any new allergies or adverse reactions: No Arrival Time: 15:38 Had a fall or experienced change in No Accompanied By: husband activities of daily living that may affect Transfer Assistance: None risk of falls: Patient Identification Verified: Yes Hospitalized since last visit: No Secondary Verification Process Completed: Yes Pain Present Now: No Patient Requires Transmission-Based Precautions: No Patient Has Alerts: Yes Patient Alerts: Diabetic Type II Electronic Signature(s) Signed: 10/[name]/2023 4:15:39 PM By: Elizabeth Loud MSN RN CNS WTA Entered By: [name] on 10/[name]/2023 15:41:50 -------------------------------------------------------------------------------- Clinic Level of Care Assessment Details Patient Name: Date of Service: [name], [name] 10/[name]/2023 3:15 PM Medical Record Number: [id number] Patient Account Number: [id number] Date of Birth/Sex: Treating RN: [name][birth year] (56 y.o. [name] Primary Care Mubarak Bevens: SYSTEM, PCP Other Clinician: Referring Iness Pangilinan: Treating Amrie Gurganus/Extender: [name] in Treatment: 10 Clinic Level of Care Assessment Items TOOL 4 Quantity Score X- 1 0 Use when only an EandM is performed on FOLLOW-UP visit ASSESSMENTS - Nursing Assessment / Reassessment _0  - 0 Reassessment of Co-morbidities (includes updates in patient  status) _1  - 0 Reassessment of Adherence to Treatment Plan ASSESSMENTS - Wound and Skin A ssessment / Reassessment X - Simple Wound Assessment / Reassessment - one wound 1 [place], [place] ([id number]) [id number] Page 2 of 9 _2  - 0 Complex Wound Assessment / Reassessment - multiple wounds _3  - 0 Dermatologic / Skin Assessment (not related to wound area) ASSESSMENTS - Focused Assessment _4  - 0 Circumferential Edema Measurements - multi extremities _5  - 0 Nutritional Assessment / Counseling / Intervention _6  - 0 Lower Extremity Assessment (monofilament, tuning fork, pulses) _7  - 0 Peripheral Arterial Disease Assessment (using hand held doppler) ASSESSMENTS - Ostomy and/or Continence Assessment and Care _8  - 0 Incontinence Assessment and Management _9  - 0 Ostomy Care Assessment and Management (repouching, etc.) PROCESS - Coordination of Care X - Simple Patient / Family Education for ongoing care 1 15 _10  - 0 Complex (extensive) Patient / Family Education for ongoing care _11  - 0 Staff obtains Programmer, systems, Records, T Results / Process Orders est _12  - 0 Staff telephones HHA, Nursing Homes / Clarify orders / etc _13  - 0 Routine Transfer to another Facility (non-emergent condition) _14  - 0 Routine Hospital Admission (non-emergent condition) _15  - 0 New Admissions / Biomedical engineer / Ordering NPWT Apligraf, etc. , _16  - 0 Emergency Hospital Admission (emergent condition) X- 1 10 Simple Discharge Coordination _17  - 0 Complex (extensive) Discharge Coordination PROCESS - Special Needs _18  - 0 Pediatric / Minor Patient Management _19  - 0 Isolation Patient Management _20  - 0 Hearing / Language / Visual special needs _21  - 0 Assessment of Community assistance (transportation, D/C planning, etc.) _22  - 0 Additional assistance / Altered mentation _23  - 0 Support Surface(s) Assessment (bed, cushion, seat, etc.) INTERVENTIONS - Wound Cleansing /  Measurement X - Simple Wound Cleansing - one wound 1 5 _24  - 0 Complex Wound Cleansing - multiple wounds _25  - 0 Wound Imaging (photographs - any number of wounds) _26  - 0 Wound Tracing (instead  of photographs) _0  - 0 Simple Wound Measurement - one wound _1  - 0 Complex Wound Measurement - multiple wounds INTERVENTIONS - Wound Dressings X - Small Wound Dressing one or multiple wounds 1 10 _2  - 0 Medium Wound Dressing one or multiple wounds _3  - 0 Large Wound Dressing one or multiple wounds <XTKWIOXBDZHGDJME>_2<\/ASTMHDQQIWLNLGXQ>_1  - 0 Application of Medications - topical <JHERDEYCXKGYJEHU>_3<\/JSHFWYOVZCHYIFOY>_7  - 0 Application of Medications - injection INTERVENTIONS - Miscellaneous _6  - 0 External ear exam _7  - 0 Specimen Collection (cultures, biopsies, blood, body fluids, etc.) _8  - 0 Specimen(s) / Culture(s) sent or taken to Lab for analysis [place], [place] ([id number]) [id number] Page 3 of 9 _9  - 0 Patient Transfer (multiple staff / Civil Service fast streamer / Similar devices) _10  - 0 Simple Staple / Suture removal (25 or less) _11  - 0 Complex Staple / Suture removal (26 or more) _12  - 0 Hypo / Hyperglycemic Management (close monitor of Blood Glucose) _13  - 0 Ankle / Brachial Index (ABI) - do not check if billed separately X- 1 5 Vital Signs Has the patient been seen at the hospital within the last three years: Yes Total Score: 50 Level Of Care: New/Established - Level 2 Electronic Signature(s) Signed: 10/[name]/2023 4:15:39 PM By: Elizabeth Loud MSN RN CNS WTA Entered By: [name] on 10/[name]/2023 67:20:94 -------------------------------------------------------------------------------- Encounter Discharge Information Details Patient Name: Date of Service: [name], [name] 10/[name]/2023 3:15 PM Medical Record Number: [id number] Patient Account Number: [id number] Date of Birth/Sex: Treating RN: [name]-[birth year] (56 y.o. [name] Primary Care Kymari Lollis: SYSTEM, PCP Other Clinician: Referring Nixxon Faria: Treating Punam Broussard/Extender: [name] in Treatment: 10 Encounter Discharge Information Items Discharge Condition: Stable Ambulatory Status: Wheelchair Discharge Destination: Home Transportation: Private Auto Accompanied By: husband Schedule Follow-up Appointment: Yes Clinical Summary of Care: Electronic Signature(s) Signed: 10/[name]/2023 4:15:39 PM By: Elizabeth Loud MSN RN CNS WTA Entered By: [name] on 10/[name]/2023 16:07:18 -------------------------------------------------------------------------------- Lower Extremity Assessment Details Patient Name: Date of Service: MILDA, LINDVALL 10/[name]/2023 3:15 PM Medical Record Number: [id number] Patient Account Number: [id number] Date of Birth/Sex: Treating RN: Aug 31, 1966 (56 y.o. [name] Primary Care Leighanna Kirn: SYSTEM, PCP Other Clinician: [name] ([id number]) [id number] Page 4 of 9 Referring Colie Josten: Treating Makaiya Geerdes/Extender: [name] Weeks in Treatment: 10 Electronic Signature(s) Signed: 10/[name]/2023 4:15:39 PM By: Elizabeth Loud MSN RN CNS WTA Entered By: [name] on 10/[name]/2023 15:50:10 -------------------------------------------------------------------------------- Multi Wound Chart Details Patient Name: Date of Service: [name], [place] 10/[name]/2023 3:15 PM Medical Record Number: [id number] Patient Account Number: [id number] Date of Birth/Sex: Treating RN: [birth year], [name] (56 y.o. [name] Primary Care Bodhi Stenglein: SYSTEM, PCP Other Clinician: Referring Gwendlyon Zumbro: Treating Joquan Lotz/Extender: [name] in Treatment: 10 Vital Signs Height(in): 67 Pulse(bpm): 88 Weight(lbs): 164 Blood Pressure(mmHg): 157/82 Body Mass Index(BMI): 25.7 Temperature(F): 98.3 Respiratory Rate(breaths/min): 18 [1:Photos:] [N/A:N/A] Left, Posterior Lower Leg N/A N/A Wound Location: Gradually Appeared N/A N/A Wounding Event: Diabetic Wound/Ulcer of the Lower N/A N/A Primary  Etiology: Extremity Atypical N/A N/A Secondary Etiology: Anemia, Asthma, Type II Diabetes, N/A N/A Comorbid History: Neuropathy 02/23/2022 N/A N/A Date Acquired: 10 N/A N/A Weeks of Treatment: Open N/A N/A Wound Status: No N/A N/A Wound Recurrence: 1x1.3x0.1 N/A N/A Measurements L x W x D (cm) 1.021 N/A N/A A (cm) : rea 0.102 N/A N/A Volume (cm) : 9.70% N/A N/A % Reduction in A rea: 54.90% N/A N/A % Reduction in Volume: Grade 1 N/A N/A Classification: Medium N/A N/A Exudate A mount: Serosanguineous N/A N/A Exudate Type: red, brown  N/A N/A Exudate Color: Flat and Intact N/A N/A Wound Margin: Small (1-33%) N/A N/A Granulation A mount: Pink, Hyper-granulation N/A N/A Granulation Quality: Large (67-100%) N/A N/A Necrotic A mount: Fat Layer (Subcutaneous Tissue): Yes N/A N/A Exposed Structures: Fascia: No Tendon: No Muscle: No Joint: No Bone: No Salce, Jacquiline ([id number]) [id number] Page 5 of 9 Small (1-33%) N/A N/A Epithelialization: Treatment Notes Electronic Signature(s) Signed: 10/[name]/2023 4:15:39 PM By: Elizabeth Loud MSN RN CNS WTA Entered By: [name] on 10/[name]/2023 15:57:45 -------------------------------------------------------------------------------- Multi-Disciplinary Care Plan Details Patient Name: Date of Service: [name], [name] 10/[name]/2023 3:15 PM Medical Record Number: [id number] Patient Account Number: [id number] Date of Birth/Sex: Treating RN: [birth year] (56 y.o. [name] Primary Care Triva Hueber: SYSTEM, PCP Other Clinician: Referring Malai Lady: Treating Rodney Yera/Extender: [name] in Treatment: 10 Active Inactive Wound/Skin Impairment Nursing Diagnoses: Impaired tissue integrity Knowledge deficit related to smoking impact on wound healing Knowledge deficit related to ulceration/compromised skin integrity Goals: Patient/caregiver will verbalize understanding of skin care  regimen Date Initiated: 04/14/2022 Target Resolution Date: 04/14/2022 Goal Status: Active Ulcer/skin breakdown will have a volume reduction of [name]% by week 4 Date Initiated: 04/14/2022 Target Resolution Date: 05/05/2022 Goal Status: Active Interventions: Assess patient/caregiver ability to obtain necessary supplies Assess patient/caregiver ability to perform ulcer/skin care regimen upon admission and as needed Assess ulceration(s) every visit Treatment Activities: Referred to DME Shenelle Klas for dressing supplies : 04/14/2022 Skin care regimen initiated : 04/14/2022 Topical wound management initiated : 04/14/2022 Notes: Electronic Signature(s) Signed: 10/[name]/2023 4:15:39 PM By: Elizabeth Loud MSN RN CNS WTA Entered By: [name] on 10/[name]/2023 15:50:15 [name], [name] ([id number]) [id number] Page 6 of 9 -------------------------------------------------------------------------------- Pain Assessment Details Patient Name: Date of ServiceELZADA, [name] 10/[name]/2023 3:15 PM Medical Record Number: [id number] Patient Account Number: [id number] Date of Birth/Sex: Treating RN: [birth year]/[name] (56 y.o. [name] Primary Care Koya Hunger: SYSTEM, PCP Other Clinician: Referring [name]: Treating Maddox Bratcher/Extender: [name] in Treatment: 10 Active Problems Location of Pain Severity and Description of Pain Patient Has Paino No Site Locations Pain Management and Medication Current Pain Management: Electronic Signature(s) Signed: 10/[name]/2023 4:15:39 PM By: Elizabeth Loud MSN RN CNS WTA Entered By: [name] on 10/[name]/2023 15:43:58 -------------------------------------------------------------------------------- Patient/Caregiver Education Details Patient Name: Date of Service: [name] 10/[name]/2023andnbsp3:15 PM Medical Record Number: [id number] Patient Account Number: [id number] Date of Birth/Gender: Treating RN: Jun [name], [name] (56 y.o. [name] Primary Care Physician: SYSTEM, PCP Other Clinician: Referring Physician: Treating Physician/Extender: [name] in Treatment: [address] [place] ([id number]) [id number] Page 7 of 9 Education Assessment Education Provided To: Patient and Caregiver Education Topics Provided Wound/Skin Impairment: Handouts: Caring for Your Ulcer Methods: Explain/Verbal Responses: State content correctly Electronic Signature(s) Signed: 10/[name]/2023 4:15:39 PM By: Elizabeth Loud MSN RN CNS WTA Entered By: [name] on 10/[name]/2023 16:06:45 -------------------------------------------------------------------------------- Wound Assessment Details Patient Name: Date of Service: [name], [name] 10/[name]/2023 3:15 PM Medical Record Number: [id number] Patient Account Number: [id number] Date of Birth/Sex: Treating RN: [birth year] (56 y.o. [name] Primary Care Taten Merrow: SYSTEM, PCP Other Clinician: Referring Pradyun Ishman: Treating Merlon Alcorta/Extender: [name] Weeks in Treatment: 10 Wound Status Wound Number: 1 Primary Etiology: Diabetic Wound/Ulcer of the Lower Extremity Wound Location: Left, Posterior Lower Leg Secondary Etiology: Atypical Wounding Event: Gradually Appeared Wound Status: Open Date Acquired: 02/23/2022 Comorbid History: Anemia, Asthma, Type II Diabetes, Neuropathy Weeks Of Treatment: 10 Clustered Wound: No Photos Wound Measurements Length: (cm) 1 Width: (cm) 1.3 Depth: (cm) 0.1 Area: (cm) 1.021 Volume: (  cm) 0.102 % Reduction in Area: 9.7% % Reduction in Volume: 54.9% Epithelialization: Small (1-33%) Wound Description Classification: Grade 1 Wound Margin: Flat and Intact Exudate Amount: Medium [name], [name] ([id number]) Exudate Type: Serosanguineous Exudate Color: red, brown Foul Odor After Cleansing: No Slough/Fibrino Yes [id number] Page 8 of 9 Wound Bed Granulation Amount:  Small (1-33%) Exposed Structure Granulation Quality: Pink, Hyper-granulation Fascia Exposed: No Necrotic Amount: Large (67-100%) Fat Layer (Subcutaneous Tissue) Exposed: Yes Necrotic Quality: Adherent Slough Tendon Exposed: No Muscle Exposed: No Joint Exposed: No Bone Exposed: No Treatment Notes Wound #1 (Lower Leg) Wound Laterality: Left, Posterior Cleanser [place] Ancillary Kit - 15 Day Supply Discharge Instruction: Use supplies as instructed; Kit contains: (15) Saline Bullets; (15) 3x3 Gauze; 15 pr Gloves Peri-Wound Care Topical Primary Dressing Prisma 4.34 (in) Discharge Instruction: Moisten w/normal saline or sterile water; Cover wound as directed. Do not remove from wound bed. Secondary Dressing telfa non-adherent pad Discharge Instruction: cut small piece to fit over wound Secured With Tegaderm Film 4x4 (in/in) Discharge Instruction: Apply to wound bed Compression Wrap Compression Stockings Add-Ons Electronic Signature(s) Signed: 10/[name]/2023 4:15:39 PM By: Elizabeth Loud MSN RN CNS WTA Entered By: [name] on 10/[name]/2023 15:49:24 -------------------------------------------------------------------------------- Vitals Details Patient Name: Date of Service: [name], [place] 10/[name]/2023 3:15 PM Medical Record Number: [id number] Patient Account Number: [id number] Date of Birth/Sex: Treating RN: [name], [name] (57 y.o. [name] Primary Care Jullian Previti: SYSTEM, PCP Other Clinician: Referring Agustus Mane: Treating Nyah Shepherd/Extender: [name] in Treatment: 10 Vital Signs Time Taken: 15:41 Temperature (F): 98.3 Height (in): 67 Pulse (bpm): 88 Weight (lbs): 164 Respiratory Rate (breaths/min): 18 Body Mass Index (BMI): 25.7 Blood Pressure (mmHg): 157/82 Reference Range: 80 - 120 mg / dl Vanderhoff, Naydeline ([id number]) [id number] Page 9 of 9 Electronic Signature(s) Signed: 10/[name]/2023 4:15:39 PM By: Elizabeth Loud MSN RN CNS  WTA Entered By: [name] on 10/[name]/2023 15:43:51

## 2022-07-13 ENCOUNTER — Encounter: Payer: Medicare Other | Attending: Physician Assistant | Admitting: Physician Assistant

## 2022-07-13 DIAGNOSIS — Z96643 Presence of artificial hip joint, bilateral: Secondary | ICD-10-CM | POA: Insufficient documentation

## 2022-07-13 DIAGNOSIS — N186 End stage renal disease: Secondary | ICD-10-CM | POA: Diagnosis not present

## 2022-07-13 DIAGNOSIS — E1122 Type 2 diabetes mellitus with diabetic chronic kidney disease: Secondary | ICD-10-CM | POA: Insufficient documentation

## 2022-07-13 DIAGNOSIS — L97828 Non-pressure chronic ulcer of other part of left lower leg with other specified severity: Secondary | ICD-10-CM | POA: Diagnosis not present

## 2022-07-13 DIAGNOSIS — E11622 Type 2 diabetes mellitus with other skin ulcer: Secondary | ICD-10-CM | POA: Diagnosis present

## 2022-07-13 DIAGNOSIS — I120 Hypertensive chronic kidney disease with stage 5 chronic kidney disease or end stage renal disease: Secondary | ICD-10-CM | POA: Insufficient documentation

## 2022-07-13 DIAGNOSIS — Z992 Dependence on renal dialysis: Secondary | ICD-10-CM | POA: Insufficient documentation

## 2022-07-13 NOTE — Progress Notes (Addendum)
LESSLIE, MOSSA ([id number]) [id number] Page 1 of 8 Visit Report for 07/13/2022 Arrival Information Details Patient Name: Date of Service: [name], [name] 07/13/2022 3:30 PM Medical Record Number: [id number] Patient Account Number: [id number] Date of Birth/Sex: Treating RN: [birth year] (56 y.o. [name] Primary Care Juergen Hardenbrook: SYSTEM, PCP Other Clinician: Referring Daianna Vasques: Treating Sufian Ravi/Extender: Vladimir Faster in Treatment: 12 Visit Information History Since Last Visit All ordered tests and consults were completed: No Patient Arrived: Crutches Added or deleted any medications: No Arrival Time: 15:52 Any new allergies or adverse reactions: No Accompanied By: self Had a fall or experienced change in No Transfer Assistance: None activities of daily living that may affect Patient Identification Verified: Yes risk of falls: Secondary Verification Process Completed: Yes Signs or symptoms of abuse/neglect since last visito No Patient Requires Transmission-Based Precautions: No Hospitalized since last visit: No Patient Has Alerts: Yes Implantable device outside of the clinic excluding No Patient Alerts: Diabetic Type II cellular tissue based products placed in the center since last visit: Has Dressing in Place as Prescribed: Yes Pain Present Now: No Electronic Signature(s) Signed: 07/17/2022 1:14:49 PM By: Carlene Coria RN Entered By: Carlene Coria on 07/13/2022 15:56:59 -------------------------------------------------------------------------------- Clinic Level of Care Assessment Details Patient Name: Date of Service: [name], [name] 07/13/2022 3:30 PM Medical Record Number: [id number] Patient Account Number: [id number] Date of Birth/Sex: Treating RN: [birth year] (56 y.o. [name] Primary Care Dow Blahnik: SYSTEM, PCP Other Clinician: Referring Zayin Valadez: Treating Yessica Putnam/Extender: Vladimir Faster in  Treatment: 12 Clinic Level of Care Assessment Items TOOL 4 Quantity Score X- 1 0 Use when only an EandM is performed on FOLLOW-UP visit ASSESSMENTS - Nursing Assessment / Reassessment X- 1 10 Reassessment of Co-morbidities (includes updates in patient status) X- 1 5 Reassessment of Adherence to Treatment Plan GATCHALIAN, [place] ([id number]) [id number] Page 2 of 8 ASSESSMENTS - Wound and Skin A ssessment / Reassessment X - Simple Wound Assessment / Reassessment - one wound 1 5 []  - 0 Complex Wound Assessment / Reassessment - multiple wounds []  - 0 Dermatologic / Skin Assessment (not related to wound area) ASSESSMENTS - Focused Assessment []  - 0 Circumferential Edema Measurements - multi extremities []  - 0 Nutritional Assessment / Counseling / Intervention []  - 0 Lower Extremity Assessment (monofilament, tuning fork, pulses) []  - 0 Peripheral Arterial Disease Assessment (using hand held doppler) ASSESSMENTS - Ostomy and/or Continence Assessment and Care []  - 0 Incontinence Assessment and Management []  - 0 Ostomy Care Assessment and Management (repouching, etc.) PROCESS - Coordination of Care X - Simple Patient / Family Education for ongoing care 1 15 []  - 0 Complex (extensive) Patient / Family Education for ongoing care []  - 0 Staff obtains Programmer, systems, Records, T Results / Process Orders est []  - 0 Staff telephones HHA, Nursing Homes / Clarify orders / etc []  - 0 Routine Transfer to another Facility (non-emergent condition) []  - 0 Routine Hospital Admission (non-emergent condition) []  - 0 New Admissions / Biomedical engineer / Ordering NPWT Apligraf, etc. , []  - 0 Emergency Hospital Admission (emergent condition) X- 1 10 Simple Discharge Coordination []  - 0 Complex (extensive) Discharge Coordination PROCESS - Special Needs []  - 0 Pediatric / Minor Patient Management []  - 0 Isolation Patient Management []  - 0 Hearing / Language / Visual  special needs []  - 0 Assessment of Community assistance (transportation, D/C planning, etc.) []  - 0 Additional assistance / Altered mentation []  - 0 Support Surface(s) Assessment (bed, cushion, seat, etc.) INTERVENTIONS - Wound  Cleansing / Measurement X - Simple Wound Cleansing - one wound 1 5 []  - 0 Complex Wound Cleansing - multiple wounds []  - 0 Wound Imaging (photographs - any number of wounds) []  - 0 Wound Tracing (instead of photographs) X- 1 5 Simple Wound Measurement - one wound []  - 0 Complex Wound Measurement - multiple wounds INTERVENTIONS - Wound Dressings []  - 0 Small Wound Dressing one or multiple wounds []  - 0 Medium Wound Dressing one or multiple wounds []  - 0 Large Wound Dressing one or multiple wounds []  - 0 Application of Medications - topical []  - 0 Application of Medications - injection INTERVENTIONS - Miscellaneous []  - 0 External ear exam Gamero, Elizabeth ([id number]) [id number] Page 3 of 8 []  - 0 Specimen Collection (cultures, biopsies, blood, body fluids, etc.) []  - 0 Specimen(s) / Culture(s) sent or taken to Lab for analysis []  - 0 Patient Transfer (multiple staff / Harrel Lemon Lift / Similar devices) []  - 0 Simple Staple / Suture removal (25 or less) []  - 0 Complex Staple / Suture removal (26 or more) []  - 0 Hypo / Hyperglycemic Management (close monitor of Blood Glucose) []  - 0 Ankle / Brachial Index (ABI) - do not check if billed separately X- 1 5 Vital Signs Has the patient been seen at the hospital within the last three years: Yes Total Score: 60 Level Of Care: New/Established - Level 2 Electronic Signature(s) Signed: 07/17/2022 1:14:49 PM By: Carlene Coria RN Entered By: Carlene Coria on 07/13/2022 16:24:51 -------------------------------------------------------------------------------- Encounter Discharge Information Details Patient Name: Date of Service: [name], [name] 07/13/2022 3:30 PM Medical Record  Number: [id number] Patient Account Number: [id number] Date of Birth/Sex: Treating RN: 1966-02-10 (56 y.o. [name] Primary Care Jamilla Galli: SYSTEM, PCP Other Clinician: Referring Persia Lintner: Treating Iris Tatsch/Extender: Vladimir Faster in Treatment: 12 Encounter Discharge Information Items Discharge Condition: Stable Ambulatory Status: Crutches Discharge Destination: Home Transportation: Private Auto Accompanied By: self Schedule Follow-up Appointment: Yes Clinical Summary of Care: Electronic Signature(s) Signed: 07/14/2022 8:24:31 AM By: Carlene Coria RN Entered By: Carlene Coria on 07/14/2022 08:24:31 Lower Extremity Assessment Details -------------------------------------------------------------------------------- [name] ([id number]) [id number] Page 4 of 8 Patient Name: Date of Service: [name], [name] 07/13/2022 3:30 PM Medical Record Number: [id number] Patient Account Number: [id number] Date of Birth/Sex: Treating RN: [birth year] (56 y.o. [name] Primary Care Cathaleen Korol: SYSTEM, PCP Other Clinician: Referring Captola Teschner: Treating Emilea Goga/Extender: Kathrynn Running Weeks in Treatment: 12 Electronic Signature(s) Signed: 07/13/2022 4:19:26 PM By: Carlene Coria RN Entered By: Carlene Coria on 07/13/2022 16:19:26 -------------------------------------------------------------------------------- Multi Wound Chart Details Patient Name: Date of Service: [name], [place] 07/13/2022 3:30 PM Medical Record Number: [id number] Patient Account Number: [id number] Date of Birth/Sex: Treating RN: 1966-06-01 (56 y.o. [name] Primary Care Wai Litt: SYSTEM, PCP Other Clinician: Referring Ladarrian Asencio: Treating Alzena Gerber/Extender: Vladimir Faster in Treatment: 12 Vital Signs Height(in): 67 Pulse(bpm): 76 Weight(lbs): 164 Blood Pressure(mmHg): 179/97 Body Mass Index(BMI): 25.7 Temperature(F):  98 Respiratory Rate(breaths/min): 16 [1:Photos: No Photos Left, Posterior Lower Leg Wound Location: Gradually Appeared Wounding Event: Diabetic Wound/Ulcer of the Lower Primary Etiology: Extremity Atypical Secondary Etiology: 02/23/2022 Date Acquired: 12 Weeks of Treatment: Open Wound Status: No Wound  Recurrence: 0.7x0.6x0.1 Measurements L x W x D (cm) 0.33 A (cm) : rea 0.033 Volume (cm) : 70.80% % Reduction in A rea: 85.40% % Reduction in Volume: Grade 1 Classification: Medium Exudate A mount: Serosanguineous Exudate Type: red, brown Exudate  Color:] [N/A:N/A N/A N/A N/A N/A N/A N/A N/A  N/A N/A N/A N/A N/A N/A N/A N/A N/A N/A] Treatment Notes Electronic Signature(s) Signed: 07/13/2022 4:22:07 PM By: Carlene Coria RN Entered By: Carlene Coria on 07/13/2022 16:22:07 [name] ([id number]) [id number] Page 5 of 8 -------------------------------------------------------------------------------- Multi-Disciplinary Care Plan Details Patient Name: Date of Service: [name], [name] 07/13/2022 3:30 PM Medical Record Number: [id number] Patient Account Number: [id number] Date of Birth/Sex: Treating RN: Apr 18, 1966 (56 y.o. [name] Primary Care Terease Marcotte: SYSTEM, PCP Other Clinician: Referring Adaliz Dobis: Treating Shivansh Hardaway/Extender: Vladimir Faster in Treatment: 12 Active Inactive Wound/Skin Impairment Nursing Diagnoses: Impaired tissue integrity Knowledge deficit related to smoking impact on wound healing Knowledge deficit related to ulceration/compromised skin integrity Goals: Patient/caregiver will verbalize understanding of skin care regimen Date Initiated: 04/14/2022 Target Resolution Date: 08/14/2022 Goal Status: Active Ulcer/skin breakdown will have a volume reduction of 30% by week 4 Date Initiated: 04/14/2022 Date Inactivated: 07/13/2022 Target Resolution Date: 05/05/2022 Goal Status: Unmet Unmet Reason:  comoridities Interventions: Assess patient/caregiver ability to obtain necessary supplies Assess patient/caregiver ability to perform ulcer/skin care regimen upon admission and as needed Assess ulceration(s) every visit Treatment Activities: Referred to DME Harmony Sandell for dressing supplies : 04/14/2022 Skin care regimen initiated : 04/14/2022 Topical wound management initiated : 04/14/2022 Notes: Electronic Signature(s) Signed: 07/13/2022 4:21:50 PM By: Carlene Coria RN Entered By: Carlene Coria on 07/13/2022 16:21:49 -------------------------------------------------------------------------------- Pain Assessment Details Patient Name: Date of Service: [name], [name] 07/13/2022 3:30 PM Medical Record Number: [id number] Patient Account Number: [id number] Date of Birth/Sex: Treating RN: February 10, 1966 (56 y.o. [name] Primary Care Tiaja Hagan: SYSTEM, PCP Other Clinician: [name] ([id number]) [id number] Page 6 of 8 Referring Tatiyanna Lashley: Treating Delsie Amador/Extender: Vladimir Faster in Treatment: 12 Active Problems Location of Pain Severity and Description of Pain Patient Has Paino No Site Locations Pain Management and Medication Current Pain Management: Electronic Signature(s) Signed: 07/17/2022 1:14:49 PM By: Carlene Coria RN Entered By: Carlene Coria on 07/13/2022 15:58:20 -------------------------------------------------------------------------------- Patient/Caregiver Education Details Patient Name: Date of Service: [name], [name] 11/13/2023andnbsp3:30 PM Medical Record Number: [id number] Patient Account Number: [id number] Date of Birth/Gender: Treating RN: 1965/10/28 (56 y.o. [name] Primary Care Physician: SYSTEM, PCP Other Clinician: Referring Physician: Treating Physician/Extender: Vladimir Faster in Treatment: 12 Education Assessment Education Provided To: Patient Education Topics Provided Wound/Skin  Impairment: Methods: Explain/Verbal Responses: State content correctly [employer]) Signed: 07/17/2022 1:14:49 PM By: Carlene Coria RN Entered By: Carlene Coria on 07/13/2022 16:25:37 [name], [name] ([id number]) [id number] Page 7 of 8 -------------------------------------------------------------------------------- Wound Assessment Details Patient Name: Date of Service: [name], [name] 07/13/2022 3:30 PM Medical Record Number: [id number] Patient Account Number: [id number] Date of Birth/Sex: Treating RN: 1966-07-18 (56 y.o. [name] Primary Care Carrol Bondar: SYSTEM, PCP Other Clinician: Referring Chloee Tena: Treating [name]/Extender: Kathrynn Running Weeks in Treatment: 12 Wound Status Wound Number: 1 Primary Etiology: Diabetic Wound/Ulcer of the Lower Extremity Wound Location: Left, Posterior Lower Leg Secondary Etiology: Atypical Wounding Event: Gradually Appeared Wound Status: Open Date Acquired: 02/23/2022 Weeks Of Treatment: 12 Clustered Wound: No Wound Measurements Length: (cm) 0.7 Width: (cm) 0.6 Depth: (cm) 0.1 Area: (cm) 0.33 Volume: (cm) 0.033 % Reduction in Area: 70.8% % Reduction in Volume: 85.4% Wound Description Classification: Grade 1 Exudate Amount: Medium Exudate Type: Serosanguineous Exudate Color: red, brown Treatment Notes Wound #1 (Lower Leg) Wound Laterality: Left, Posterior Cleanser Peri-Wound Care Topical Primary Dressing Secondary Dressing Secured With Compression Wrap Compression Stockings Add-Ons Electronic Signature(s) Signed: 07/17/2022 1:14:49 PM By: Carlene Coria RN Entered By: Carlene Coria on  07/13/2022 16:01:00 [name], [name] ([id number]) [id number] Page 8 of 8 -------------------------------------------------------------------------------- Vitals Details Patient Name: Date of Service: YOCELIN, VANLUE 07/13/2022 3:30 PM Medical Record Number:  [id number] Patient Account Number: [id number] Date of Birth/Sex: Treating RN: December 18, 1965 (56 y.o. [name] Primary Care Nephi Savage: SYSTEM, PCP Other Clinician: Referring Isidra Mings: Treating Seline Enzor/Extender: Vladimir Faster in Treatment: 12 Vital Signs Time Taken: 15:57 Temperature (F): 98 Height (in): 67 Pulse (bpm): 76 Weight (lbs): 164 Respiratory Rate (breaths/min): 16 Body Mass Index (BMI): 25.7 Blood Pressure (mmHg): 179/97 Reference Range: 80 - 120 mg / dl Electronic Signature(s) Signed: 07/17/2022 1:14:49 PM By: Carlene Coria RN Entered By: Carlene Coria on 07/13/2022 15:58:09

## 2022-07-13 NOTE — Progress Notes (Signed)
[name], [name] ([id number]) [id number] Page 1 of 6 Visit Report for 07/13/2022 Chief Complaint Document Details Patient Name: Date of Service: [name], [name] 07/13/2022 3:30 PM Medical Record Number: [id number] Patient Account Number: [id number] Date of Birth/Sex: Treating RN: 12/25/1965 (56 y.o. [name] Primary Care Provider: SYSTEM, PCP Other Clinician: Referring Provider: Treating Provider/Extender: Vladimir Faster in Treatment: 12 Information Obtained from: Patient Chief Complaint Ulcer left leg posteriorly Electronic Signature(s) Signed: 07/13/2022 3:53:56 PM By: Worthy Keeler PA-C Entered By: Worthy Keeler on 07/13/2022 15:53:56 -------------------------------------------------------------------------------- HPI Details Patient Name: Date of Service: [name], [name] 07/13/2022 3:30 PM Medical Record Number: [id number] Patient Account Number: [id number] Date of Birth/Sex: Treating RN: 02-22-1966 (56 y.o. [name] Primary Care Provider: SYSTEM, PCP Other Clinician: Referring Provider: Treating Provider/Extender: Vladimir Faster in Treatment: 12 History of Present Illness HPI Description: 04-14-2022 upon evaluation today patient presents for initial inspection here in the clinic concerning issues that she has been having with the wound over the left posterior lower extremity. This started out as a painful red burning area which she tells me has developed into more of an eschar covered region. 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 With that being said I discussed with her today what exactly that is and what it means. Also discussed that she is going to likely need to be closely monitored to ensure this does not get worse. I did perform a debridement today but I tried to be as careful as possible with this debridement in order to not cause anything to worsen nonetheless we  needed to get some of the eschar off. 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 She also has been on doxycycline which was started on 03-27-2022 which has not really made a significant improvement. She is also been on mupirocin topically which is not helping. She has been using some essential oil which she feels like made it feel little bit better but still this does not seem to be getting better. Overall she has become very frustrated with the situation. 04-21-2022 upon evaluation today patient appears to be doing well currently with regard to her wound. She has been tolerating the dressing changes using just the mupirocin we have been waiting on the Santyl. I tried multiple places to send this to [facility] as well ended up having a going through but at the same time she is not able to actually get that currently due to the fact that she has been told that I did not "write the prescription correctly and it will not go through her insurance that way". With that being said I am not exactly sure what could be wrong about it is actually a macro that I used to put the prescription in the dosing is appropriate and I even put the measurements on the prescription for the wound size which is needed as well. With that being said I am going to call the pharmacy today while the patient is here in the clinic to verify what is going on and make sure we get this taken care of. [name] ([id number]) [id number] Page 2 of 6 05-11-2022 upon evaluation today patient appears to be doing well with regard to her wound. She is showing signs of improvement a lot of the necrotic tissue is loosening up and she is showing more think  good granulation tissue starting to poke through. With that being said I do not see any evidence of active infection locally or systemically at this  time. 05-25-2022 upon evaluation today patient appears to be doing well currently in regard to her wound. We are actually making signs of good progress. Fortunately I do not see any evidence of active infection which is great. She does have more granulation tissue starting to show itself and I am happy in that regard. I do believe the Santyl however still probably her best bet based on what we are seeing. 06-08-2022 upon evaluation today patient appears to be doing better in regard to her wound she is actually showing signs of good improvement which is great news and overall I am extremely pleased with where things stand today. There does not appear to be any evidence of active infection locally or systemically at this time which is great news. No fevers, chills, nausea, vomiting, or diarrhea. 06-29-2022 upon evaluation today patient appears to be doing well currently in regard to her wound. This actually has some granulation budding but also is very slow to heal. She has been using the collagen although she did run out and went back to the [place] after she ran out of the [place]. Nonetheless she tells me it was burning some although she was using some colloidal silver. That was being used instead of the saline and she is not sure if that coupled with the silver in the [place] was too much or what. 07-13-2022 upon evaluation today patient appears to be doing well currently in regard to her leg ulcer this is measuring smaller and looking much better. Fortunately I do not see any evidence of infection locally or systemically at this time which is great news. No fevers, chills, nausea, vomiting, or diarrhea. Electronic Signature(s) Signed: 07/14/2022 8:01:38 AM By: Worthy Keeler PA-C Entered By: Worthy Keeler on 07/14/2022 08:01:38 -------------------------------------------------------------------------------- Physical Exam Details Patient Name: Date of Service: [name], [name] 07/13/2022 3:30  PM Medical Record Number: [id number] Patient Account Number: [id number] Date of Birth/Sex: Treating RN: 29-Feb-1966 (56 y.o. [name] Primary Care Provider: SYSTEM, PCP Other Clinician: Referring Provider: Treating Provider/Extender: Vladimir Faster in Treatment: 71 Constitutional Well-nourished and well-hydrated in no acute distress. Respiratory normal breathing without difficulty. Psychiatric this patient is able to make decisions and demonstrates good insight into disease process. Alert and Oriented x 3. pleasant and cooperative. Notes Upon inspection patient's wound bed showed evidence of good granulation and epithelization at this point. Fortunately I see no signs of active infection locally or systemically which is great news and overall I am extremely pleased with where we stand today. Electronic Signature(s) Signed: 07/14/2022 8:01:51 AM By: Worthy Keeler PA-C Entered By: Worthy Keeler on 07/14/2022 08:01:50 [name] ([id number]) [id number] Page 3 of 6 -------------------------------------------------------------------------------- Physician Orders Details Patient Name: Date of Service: [name], [name] 07/13/2022 3:30 PM Medical Record Number: [id number] Patient Account Number: [id number] Date of Birth/Sex: Treating RN: 27-Sep-1965 (56 y.o. [name] Primary Care Provider: SYSTEM, PCP Other Clinician: Referring Provider: Treating Provider/Extender: Vladimir Faster in Treatment: 12 Verbal / Phone Orders: No Diagnosis Coding ICD-10 Coding Code Description E11.622 Type 2 diabetes mellitus with other skin ulcer L97.828 Non-pressure chronic ulcer of other part of left lower leg with other specified severity I10 Essential (primary) hypertension N18.6 End stage renal disease Z99.2 Dependence on renal dialysis Follow-up Appointments Return Appointment in 2 weeks. Bathing/ Chief Operating Officer  May  shower; gently cleanse wound with antibacterial soap, rinse and pat dry prior to dressing wounds Anesthetic (Use 'Patient Medications' Section for Anesthetic Order Entry) Lidocaine applied to wound bed Wound Treatment Electronic Signature(s) Signed: 07/13/2022 4:23:38 PM By: Carlene Coria RN Signed: 07/15/2022 2:43:36 PM By: Worthy Keeler PA-C Entered By: Carlene Coria on 07/13/2022 16:23:38 -------------------------------------------------------------------------------- Problem List Details Patient Name: Date of Service: [name], [name] 07/13/2022 3:30 PM Medical Record Number: [id number] Patient Account Number: [id number] Date of Birth/Sex: Treating RN: [birth year] (56 y.o. [name] Primary Care Provider: SYSTEM, PCP Other Clinician: Referring Provider: Treating Provider/Extender: Vladimir Faster in Treatment: [address] [place], [place] ([id number]) [id number] Page 4 of 6 ICD-10 Encounter Code Description Active Date MDM Diagnosis E11.622 Type 2 diabetes mellitus with other skin ulcer 04/14/2022 No Yes L97.828 Non-pressure chronic ulcer of other part of left lower leg with other specified 04/14/2022 No Yes severity I10 Essential (primary) hypertension 04/14/2022 No Yes N18.6 End stage renal disease 04/14/2022 No Yes Z99.2 Dependence on renal dialysis 04/14/2022 No Yes Inactive Problems Resolved Problems Electronic Signature(s) Signed: 07/13/2022 3:53:47 PM By: Worthy Keeler PA-C Entered By: Worthy Keeler on 07/13/2022 15:53:47 -------------------------------------------------------------------------------- Progress Note Details Patient Name: Date of Service: [name], [place] 07/13/2022 3:30 PM Medical Record Number: [id number] Patient Account Number: [id number] Date of Birth/Sex: Treating RN: November 16, 1965 (56 y.o. [name] Primary Care Provider: SYSTEM, PCP Other Clinician: Referring Provider: Treating  Provider/Extender: Vladimir Faster in Treatment: 12 Subjective Chief Complaint Information obtained from Patient Ulcer left leg posteriorly History of Present Illness (HPI) 04-14-2022 upon evaluation today patient presents for initial inspection here in the clinic concerning issues that she has been having with the wound over the left posterior lower extremity. This started out as a painful red burning area which she tells me has developed into more of an eschar covered region. 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 With that being said I discussed with her today what exactly that is and what it means. Also discussed that she is going to likely need to be closely monitored to ensure this does not get worse. I did perform a debridement today but I tried to be as careful as possible with this debridement in order to not cause anything to worsen nonetheless we needed to get some of the eschar off. 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 She also has been on doxycycline which was started on 03-27-2022 which has not really made a significant improvement. She is also been on mupirocin topically which is not helping. She has been using some essential oil which she feels like made it feel little bit better but still this does not seem to be getting better. Overall she has become very frustrated with the situation. 04-21-2022 upon evaluation today patient appears to be doing well currently with regard to her wound. She has been tolerating the dressing changes using just the mupirocin we have been waiting on the Santyl. I [facility] ended up having a going through but at the same time she is not able to actually get that currently due to the fact that she has been told that I did not "write the prescription  correctly and it will not go through her [name], [name] ([id number]) [id number] Page 5 of  [address] insurance that [address]". With that being said I am not exactly sure what could be wrong about it is actually a macro that I used to put the prescription in the dosing is appropriate and I even put the measurements on the prescription for the wound size which is needed as well. With that being said I am going to call the pharmacy today while the patient is here in the clinic to verify what is going on and make sure we get this taken care of. 05-11-2022 upon evaluation today patient appears to be doing well with regard to her wound. She is showing signs of improvement a lot of the necrotic tissue is loosening up and she is showing more think good granulation tissue starting to poke through. With that being said I do not see any evidence of active infection locally or systemically at this time. 05-25-2022 upon evaluation today patient appears to be doing well currently in regard to her wound. We are actually making signs of good progress. Fortunately I do not see any evidence of active infection which is great. She does have more granulation tissue starting to show itself and I am happy in that regard. I do believe the Santyl however still probably her best bet based on what we are seeing. 06-08-2022 upon evaluation today patient appears to be doing better in regard to her wound she is actually showing signs of good improvement which is great news and overall I am extremely pleased with where things stand today. There does not appear to be any evidence of active infection locally or systemically at this time which is great news. No fevers, chills, nausea, vomiting, or diarrhea. 06-29-2022 upon evaluation today patient appears to be doing well currently in regard to her wound. This actually has some granulation budding but also is very slow to heal. She has been using the collagen although she  did run out and went back to the [place] after she ran out of the [place]. Nonetheless she tells me it was burning some although she was using some colloidal silver. That was being used instead of the saline and she is not sure if that coupled with the silver in the [place] was too much or what. 07-13-2022 upon evaluation today patient appears to be doing well currently in regard to her leg ulcer this is measuring smaller and looking much better. Fortunately I do not see any evidence of infection locally or systemically at this time which is great news. No fevers, chills, nausea, vomiting, or diarrhea. Objective Constitutional Well-nourished and well-hydrated in no acute distress. Vitals Time Taken: 3:57 PM, Height: 67 in, Weight: 164 lbs, BMI: 25.7, Temperature: 98 F, Pulse: 76 bpm, Respiratory Rate: 16 breaths/min, Blood Pressure: 179/97 mmHg. Respiratory normal breathing without difficulty. Psychiatric this patient is able to make decisions and demonstrates good insight into disease process. Alert and Oriented x 3. pleasant and cooperative. General Notes: Upon inspection patient's wound bed showed evidence of good granulation and epithelization at this point. Fortunately I see no signs of active infection locally or systemically which is great news and overall I am extremely pleased with where we stand today. Integumentary (Hair, Skin) Wound #1 status is Open. Original cause of wound was Gradually Appeared. The date acquired was: 02/23/2022. The wound has been in treatment 12 weeks. The wound is located on the Left,Posterior Lower Leg. The wound measures 0.7cm length x 0.6cm width x 0.1cm depth; 0.33cm^2 area and 0.033cm^3 volume. There is a medium amount of  serosanguineous drainage noted. 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 Bathing/ Shower/ Hygiene: May shower; gently cleanse wound with antibacterial soap, rinse and pat dry prior to dressing wounds Anesthetic (Use 'Patient Medications' Section for Anesthetic Order Entry): Lidocaine applied to wound bed 1. I am going to recommend that we have the patient going continue to monitor for any signs of worsening or infection obviously anything changes she knows [name], [name] ([id number]) [id number] Page 6 of 6 to contact the office and let me know. 2 I am going to suggest we continue with the silver collagen which I think is doing a good job here. 3. I would also suggest she continue with the bordered foam dressing to cover which I think is doing excellent for her as well. We will see patient back for reevaluation in 1 week here in the clinic. If anything worsens or changes patient will contact our office for additional recommendations. Electronic Signature(s) Signed: 07/14/2022 8:02:25 AM By: Worthy Keeler PA-C Entered By: Worthy Keeler on 07/14/2022 08:02:25 -------------------------------------------------------------------------------- SuperBill Details Patient Name: Date of Service: [name] 07/13/2022 Medical Record Number: [id number] Patient Account Number: [id number] Date of Birth/Sex: Treating RN: 06-02-1966 (56 y.o. [name] Primary Care Provider: SYSTEM, PCP Other Clinician: Referring Provider: Treating Provider/Extender: Vladimir Faster in Treatment: 12 Diagnosis Coding ICD-10 Codes Code Description E11.622 Type 2 diabetes mellitus with other skin ulcer L97.828 Non-pressure chronic ulcer of other part of left lower leg with other specified severity I10 Essential (primary) hypertension N18.6 End stage renal disease Z99.2 Dependence on renal dialysis Facility  Procedures : CPT4 Code: [id number] Description: [phone] - WOUND CARE VISIT-LEV 2 EST PT Modifier: Quantity: 1 Physician Procedures : CPT4 Code Description Modifier [id number] - WC PHYS LEVEL 3 - EST PT ICD-10 Diagnosis Description E11.622 Type 2 diabetes mellitus with other skin ulcer L97.828 Non-pressure chronic ulcer of other part of left lower leg with other specified severity  I10 Essential (primary) hypertension N18.6 End stage renal disease Quantity: 1 Electronic Signature(s) Signed: 07/14/2022 8:02:45 AM By: Worthy Keeler PA-C Previous Signature: 07/13/2022 4:25:11 PM Version By: Carlene Coria RN Entered By: Worthy Keeler on 07/14/2022 08:02:45

## 2022-07-28 ENCOUNTER — Encounter: Payer: Medicare Other | Admitting: Physician Assistant

## 2022-07-28 DIAGNOSIS — E11622 Type 2 diabetes mellitus with other skin ulcer: Secondary | ICD-10-CM | POA: Diagnosis not present

## 2022-07-28 NOTE — Progress Notes (Addendum)
Elizabeth, [name] ([id number]) [id number] Page 1 of 8 Visit Report for 07/28/2022 Arrival Information Details Patient Name: Date of Service: [name], [name] 07/28/2022 3:00 PM Medical Record Number: [id number] Patient Account Number: [id number] Date of Birth/Sex: Treating RN: 31-Aug-1966 (56 y.o. Orvan Falconer Primary Care Yazhini Mcaulay: SYSTEM, PCP Other Clinician: Massie Kluver Referring Courtenay Creger: Treating Jaylynn Siefert/Extender: Vladimir Faster in Treatment: 15 Visit Information History Since Last Visit All ordered tests and consults were completed: No Patient Arrived: Crutches Added or deleted any medications: No Arrival Time: 15:15 Any Elizabeth allergies or adverse reactions: No Transfer Assistance: None Had a fall or experienced change in No Patient Identification Verified: Yes activities of daily living that may affect Secondary Verification Process Completed: Yes risk of falls: Patient Requires Transmission-Based Precautions: No Signs or symptoms of abuse/neglect since last visito No Patient Has Alerts: Yes Hospitalized since last visit: No Patient Alerts: Diabetic Type II Implantable device outside of the clinic excluding No cellular tissue based products placed in the center since last visit: Has Dressing in Place as Prescribed: Yes Pain Present Now: No Electronic Signature(s) Signed: 07/28/2022 5:05:44 PM By: Massie Kluver Entered By: Massie Kluver on 07/28/2022 15:24:03 -------------------------------------------------------------------------------- Clinic Level of Care Assessment Details Patient Name: Date of Service: [name], [name] 07/28/2022 3:00 PM Medical Record Number: [id number] Patient Account Number: [id number] Date of Birth/Sex: Treating RN: 1966/05/06 (56 y.o. Orvan Falconer Primary Care Skylor Hughson: SYSTEM, PCP Other Clinician: Massie Kluver Referring Chasmine Lender: Treating Hayly Litsey/Extender: Vladimir Faster in Treatment: 15 Clinic Level of Care Assessment Items TOOL 4 Quantity Score _0  - 0 Use when only an EandM is performed on FOLLOW-UP visit ASSESSMENTS - Nursing Assessment / Reassessment X- 1 10 Reassessment of Co-morbidities (includes updates in patient status) X- 1 5 Reassessment of Adherence to Treatment Plan [place], [place] ([id number]) [id number] Page 2 of 8 ASSESSMENTS - Wound and Skin A ssessment / Reassessment X - Simple Wound Assessment / Reassessment - one wound 1 5 _1  - 0 Complex Wound Assessment / Reassessment - multiple wounds _2  - 0 Dermatologic / Skin Assessment (not related to wound area) ASSESSMENTS - Focused Assessment _3  - 0 Circumferential Edema Measurements - multi extremities _4  - 0 Nutritional Assessment / Counseling / Intervention _5  - 0 Lower Extremity Assessment (monofilament, tuning fork, pulses) _6  - 0 Peripheral Arterial Disease Assessment (using hand held doppler) ASSESSMENTS - Ostomy and/or Continence Assessment and Care _7  - 0 Incontinence Assessment and Management _8  - 0 Ostomy Care Assessment and Management (repouching, etc.) PROCESS - Coordination of Care X - Simple Patient / Family Education for ongoing care 1 15 _9  - 0 Complex (extensive) Patient / Family Education for ongoing care _10  - 0 Staff obtains Programmer, systems, Records, T Results / Process Orders est _11  - 0 Staff telephones HHA, Nursing Homes / Clarify orders / etc _12  - 0 Routine Transfer to another Facility (non-emergent condition) _13  - 0 Routine Hospital Admission (non-emergent condition) _14  - 0 Elizabeth Admissions / Biomedical engineer / Ordering NPWT Apligraf, etc. , _15  - 0 Emergency Hospital Admission (emergent condition) X- 1 10 Simple Discharge Coordination _16  - 0 Complex (extensive) Discharge Coordination PROCESS - Special Needs _17  - 0 Pediatric / Minor Patient Management _18  - 0 Isolation Patient Management _19  - 0 Hearing /  Language / Visual special needs _20  - 0 Assessment of Community assistance (transportation, D/C planning, etc.) _21  - 0 Additional assistance / Altered mentation _22  - 0 Support Surface(s) Assessment (bed, cushion, seat, etc.) INTERVENTIONS - Wound  Cleansing / Measurement X - Simple Wound Cleansing - one wound 1 5 _0  - 0 Complex Wound Cleansing - multiple wounds X- 1 5 Wound Imaging (photographs - any number of wounds) _1  - 0 Wound Tracing (instead of photographs) X- 1 5 Simple Wound Measurement - one wound _2  - 0 Complex Wound Measurement - multiple wounds INTERVENTIONS - Wound Dressings _3  - 0 Small Wound Dressing one or multiple wounds X- 1 15 Medium Wound Dressing one or multiple wounds _4  - 0 Large Wound Dressing one or multiple wounds <UGQBVQXIHWTUUEKC>_0<\/KLKJZPHXTAVWPVXY>_8  - 0 Application of Medications - topical <AXKPVVZSMOLMBEML>_5<\/QGBEEFEOFHQRFXJO>_8  - 0 Application of Medications - injection INTERVENTIONS - Miscellaneous _7  - 0 External ear exam Slaby, Mykia ([id number]) [id number] Page 3 of 8 _8  - 0 Specimen Collection (cultures, biopsies, blood, body fluids, etc.) _9  - 0 Specimen(s) / Culture(s) sent or taken to Lab for analysis _10  - 0 Patient Transfer (multiple staff / Harrel Lemon Lift / Similar devices) _11  - 0 Simple Staple / Suture removal (25 or less) _12  - 0 Complex Staple / Suture removal (26 or more) _13  - 0 Hypo / Hyperglycemic Management (close monitor of Blood Glucose) _14  - 0 Ankle / Brachial Index (ABI) - do not check if billed separately X- 1 5 Vital Signs Has the patient been seen at the hospital within the last three years: Yes Total Score: 80 Level Of Care: Elizabeth/Established - Level 3 Electronic Signature(s) Signed: 07/28/2022 5:05:44 PM By: Massie Kluver Entered By: Massie Kluver on 07/28/2022 15:57:43 -------------------------------------------------------------------------------- Encounter Discharge Information Details Patient Name: Date of Service: [name], [place] 07/28/2022 3:00  PM Medical Record Number: [id number] Patient Account Number: [id number] Date of Birth/Sex: Treating RN: 09-05-1965 (56 y.o. Orvan Falconer Primary Care Ly Wass: SYSTEM, PCP Other Clinician: Massie Kluver Referring Aldahir Litaker: Treating Shamekia Tippets/Extender: Vladimir Faster in Treatment: 15 Encounter Discharge Information Items Discharge Condition: Stable Ambulatory Status: Crutches Discharge Destination: Home Transportation: Private Auto Accompanied By: self Schedule Follow-up Appointment: Yes Clinical Summary of Care: Electronic Signature(s) Signed: 07/28/2022 5:05:44 PM By: Massie Kluver Entered By: Massie Kluver on 07/28/2022 16:19:08 Lower Extremity Assessment Details -------------------------------------------------------------------------------- [name] ([id number]) [id number] Page 4 of 8 Patient Name: Date of Service: [name], [name] 07/28/2022 3:00 PM Medical Record Number: [id number] Patient Account Number: [id number] Date of Birth/Sex: Treating RN: [birth year] (56 y.o. Orvan Falconer Primary Care Mekhia Brogan: SYSTEM, PCP Other Clinician: Massie Kluver Referring Lacresia Darwish: Treating Lonney Revak/Extender: Kathrynn Running Weeks in Treatment: 15 Electronic Signature(s) Signed: 07/28/2022 4:00:23 PM By: Carlene Coria RN Signed: 07/28/2022 5:05:44 PM By: Massie Kluver Entered By: Massie Kluver on 07/28/2022 15:36:09 -------------------------------------------------------------------------------- Multi Wound Chart Details Patient Name: Date of Service: [name], [name] 07/28/2022 3:00 PM Medical Record Number: [id number] Patient Account Number: [id number] Date of Birth/Sex: Treating RN: September 02, 1965 (57 y.o. Orvan Falconer Primary Care Keyvin Rison: SYSTEM, PCP Other Clinician: Massie Kluver Referring Willian Donson: Treating Daniya Aramburo/Extender: Vladimir Faster in Treatment: 15 Vital Signs Height(in):  67 Pulse(bpm): 92 Weight(lbs): 164 Blood Pressure(mmHg): 175/92 Body Mass Index(BMI): 25.7 Temperature(F): 97.8 Respiratory Rate(breaths/min): 16 [1:Photos:] [N/A:N/A] Left, Posterior Lower Leg N/A N/A Wound Location: Gradually Appeared N/A N/A Wounding Event: Diabetic Wound/Ulcer of the Lower N/A N/A Primary Etiology: Extremity Atypical N/A N/A Secondary Etiology: Anemia, Asthma, Type II Diabetes, N/A N/A Comorbid History: Neuropathy 02/23/2022 N/A N/A Date Acquired: 15 N/A N/A Weeks of Treatment: Open N/A N/A Wound Status: No N/A N/A Wound Recurrence: 0.5x0.5x0.5 N/A N/A Measurements L x W x D (cm) 0.196 N/A N/A A (cm) :  rea 0.098 N/A N/A Volume (cm) : 82.70% N/A N/A % Reduction in A rea: 56.60% N/A N/A % Reduction in Volume: Grade 1 N/A N/A Classification: Medium N/A N/A Exudate A mount: Serosanguineous N/A N/A Exudate Type: red, brown N/A N/A Exudate Color: Small (1-33%) N/A N/A Epithelialization: Treatment Notes SPALLONE, Yu ([id number]) [id number] Page 5 of 8 Electronic Signature(s) Signed: 07/28/2022 5:05:44 PM By: Massie Kluver Entered By: Massie Kluver on 07/28/2022 15:36:23 -------------------------------------------------------------------------------- Multi-Disciplinary Care Plan Details Patient Name: Date of Service: [name], [name] 07/28/2022 3:00 PM Medical Record Number: [id number] Patient Account Number: [id number] Date of Birth/Sex: Treating RN: 1965-11-15 (56 y.o. Orvan Falconer Primary Care Lorijean Husser: SYSTEM, PCP Other Clinician: Massie Kluver Referring Dorothe Elmore: Treating Keir Viernes/Extender: Vladimir Faster in Treatment: 15 Active Inactive Wound/Skin Impairment Nursing Diagnoses: Impaired tissue integrity Knowledge deficit related to smoking impact on wound healing Knowledge deficit related to ulceration/compromised skin integrity Goals: Patient/caregiver will verbalize  understanding of skin care regimen Date Initiated: 04/14/2022 Target Resolution Date: 08/14/2022 Goal Status: Active Ulcer/skin breakdown will have a volume reduction of 30% by week 4 Date Initiated: 04/14/2022 Date Inactivated: 07/13/2022 Target Resolution Date: 05/05/2022 Goal Status: Unmet Unmet Reason: comoridities Interventions: Assess patient/caregiver ability to obtain necessary supplies Assess patient/caregiver ability to perform ulcer/skin care regimen upon admission and as needed Assess ulceration(s) every visit Treatment Activities: Referred to DME Kaetlyn Noa for dressing supplies : 04/14/2022 Skin care regimen initiated : 04/14/2022 Topical wound management initiated : 04/14/2022 Notes: Electronic Signature(s) Signed: 07/28/2022 4:00:23 PM By: Carlene Coria RN Signed: 07/28/2022 5:05:44 PM By: Massie Kluver Entered By: Massie Kluver on 07/28/2022 15:36:15 [name], [name] ([id number]) [id number] Page 6 of 8 -------------------------------------------------------------------------------- Pain Assessment Details Patient Name: Date of Service: [name], [name] 07/28/2022 3:00 PM Medical Record Number: [id number] Patient Account Number: [id number] Date of Birth/Sex: Treating RN: 26-Jul-1966 (56 y.o. Orvan Falconer Primary Care Llewyn Heap: SYSTEM, PCP Other Clinician: Massie Kluver Referring Ellsie Violette: Treating Kemani Demarais/Extender: Vladimir Faster in Treatment: 15 Active Problems Location of Pain Severity and Description of Pain Patient Has Paino No Site Locations Pain Management and Medication Current Pain Management: Electronic Signature(s) Signed: 07/28/2022 4:00:23 PM By: Carlene Coria RN Signed: 07/28/2022 5:05:44 PM By: Massie Kluver Entered By: Massie Kluver on 07/28/2022 15:33:53 -------------------------------------------------------------------------------- Patient/Caregiver Education Details Patient Name: Date of Service: [name] 11/28/2023andnbsp3:00 PM Medical Record Number: [id number] Patient Account Number: [id number] Date of Birth/Gender: Treating RN: 14-Jul-1966 (56 y.o. Orvan Falconer Primary Care Physician: SYSTEM, PCP Other Clinician: Massie Kluver Referring Physician: Treating Physician/Extender: Vladimir Faster in Treatment: [address] [place] ([id number]) [id number] Page 7 of 8 Education Assessment Education Provided To: Patient Education Topics Provided Wound/Skin Impairment: Handouts: Other: continue wound care as directed Methods: Explain/Verbal Responses: State content correctly Electronic Signature(s) Signed: 07/28/2022 5:05:44 PM By: Massie Kluver Entered By: Massie Kluver on 07/28/2022 15:58:19 -------------------------------------------------------------------------------- Wound Assessment Details Patient Name: Date of Service: [name], [name] 07/28/2022 3:00 PM Medical Record Number: [id number] Patient Account Number: [id number] Date of Birth/Sex: Treating RN: [birth year] (56 y.o. Orvan Falconer Primary Care Rockwell Zentz: SYSTEM, PCP Other Clinician: Massie Kluver Referring Nilson Tabora: Treating Yonael Tulloch/Extender: Vladimir Faster in Treatment: 15 Wound Status Wound Number: 1 Primary Etiology: Diabetic Wound/Ulcer of the Lower Extremity Wound Location: Left, Posterior Lower Leg Secondary Etiology: Atypical Wounding Event: Gradually Appeared Wound Status: Open Date Acquired: 02/23/2022 Comorbid History: Anemia, Asthma, Type II Diabetes, Neuropathy Weeks Of Treatment: 15 Clustered Wound: No Photos Wound Measurements Length: (cm) 0.5 Width: (cm)  0.5 Depth: (cm) 0.1 Area: (cm) 0.196 Volume: (cm) 0.02 % Reduction in Area: 82.7% % Reduction in Volume: 91.2% Epithelialization: Small (1-33%) Tunneling: No Undermining: No Wound Description Classification: Grade 1 Exudate Amount: Medium Exudate Type:  Serosanguineous [name], [name] ([id number]) Exudate Color: red, brown Foul Odor After Cleansing: No Slough/Fibrino No [id number] Page 8 of 8 Treatment Notes Wound #1 (Lower Leg) Wound Laterality: Left, Posterior Cleanser [place] Ancillary Kit - 15 Day Supply Discharge Instruction: Use supplies as instructed; Kit contains: (15) Saline Bullets; (15) 3x3 Gauze; 15 pr Gloves Peri-Wound Care Topical Primary Dressing Prisma 4.34 (in) Discharge Instruction: Moisten w/normal saline or sterile water; Cover wound as directed. Do not remove from wound bed. Secondary Dressing telfa non-adherent pad Discharge Instruction: cut small piece to fit over wound Secured With Tegaderm Film 4x4 (in/in) Discharge Instruction: Apply to wound bed Compression Wrap Compression Stockings Add-Ons Electronic Signature(s) Signed: 07/28/2022 4:00:23 PM By: Carlene Coria RN Signed: 07/28/2022 5:05:44 PM By: Massie Kluver Entered By: Massie Kluver on 07/28/2022 15:49:21 -------------------------------------------------------------------------------- Kinta Details Patient Name: Date of Service: [name], [name] 07/28/2022 3:00 PM Medical Record Number: [id number] Patient Account Number: [id number] Date of Birth/Sex: Treating RN: 15-May-1966 (56 y.o. Orvan Falconer Primary Care Shakeerah Gradel: SYSTEM, PCP Other Clinician: Massie Kluver Referring Jadavion Spoelstra: Treating Dorice Stiggers/Extender: Vladimir Faster in Treatment: 15 Vital Signs Time Taken: 15:24 Temperature (F): 97.8 Height (in): 67 Pulse (bpm): 92 Weight (lbs): 164 Respiratory Rate (breaths/min): 16 Body Mass Index (BMI): 25.7 Blood Pressure (mmHg): 175/92 Reference Range: 80 - 120 mg / dl Electronic Signature(s) Signed: 07/28/2022 5:05:44 PM By: Massie Kluver Entered By: Massie Kluver on 07/28/2022 15:29:20

## 2022-07-28 NOTE — Progress Notes (Addendum)
NOVI, CALIA ([id number]) [id number] Page 1 of 7 Visit Report for 07/28/2022 Chief Complaint Document Details Patient Name: Date of Service: SIVAN, CUELLO 07/28/2022 3:00 PM Medical Record Number: [id number] Patient Account Number: [id number] Date of Birth/Sex: Treating RN: Dec 05, 1965 (56 y.o. Orvan Falconer Primary Care Provider: SYSTEM, PCP Other Clinician: Massie Kluver Referring Provider: Treating Provider/Extender: Vladimir Faster in Treatment: 15 Information Obtained from: Patient Chief Complaint Ulcer left leg posteriorly Electronic Signature(s) Signed: 07/28/2022 3:09:58 PM By: Worthy Keeler PA-C Entered By: Worthy Keeler on 07/28/2022 15:09:58 -------------------------------------------------------------------------------- HPI Details Patient Name: Date of Service: [name], Sh[place] 07/28/2022 3:00 PM Medical Record Number: [id number] Patient Account Number: [id number] Date of Birth/Sex: Treating RN: 10-28-1965 (56 y.o. Orvan Falconer Primary Care Provider: SYSTEM, PCP Other Clinician: Massie Kluver Referring Provider: Treating Provider/Extender: Vladimir Faster in Treatment: 15 History of Present Illness HPI Description: 04-14-2022 upon evaluation today patient presents for initial inspection here in the clinic concerning issues that she has been having with the wound over the left posterior lower extremity. This started out as a painful red burning area which she tells me has developed into more of an eschar covered region. 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 With that being said I discussed with her today what exactly that is and what it means. Also discussed that she is going to likely need to be closely monitored to ensure this does not get worse. I did perform a debridement today but I tried to be as careful as possible with this debridement in order to not cause  anything to worsen nonetheless we needed to get some of the eschar off. 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 She also has been on doxycycline which was started on 03-27-2022 which has not really made a significant improvement. She is also been on mupirocin topically which is not helping. She has been using some essential oil which she feels like made it feel little bit better but still this does not seem to be getting better. Overall she has become very frustrated with the situation. 04-21-2022 upon evaluation today patient appears to be doing well currently with regard to her wound. She has been tolerating the dressing changes using just the mupirocin we have been waiting on the Santyl. I tried multiple places to send [facility] as well ended up having a going through but at the same time she is not able to actually get that currently due to the fact that she has been told that I did not "write the prescription correctly and it will not go through her insurance that way". With that being said I am not exactly sure what could be wrong about it is actually a macro that I used to put the prescription in the dosing is appropriate and I even put the measurements on the prescription for the wound size which is needed as well. With that being said I am going to call the pharmacy today while the patient is here in the clinic to verify what is going on and make sure we get this taken care of. DASHAUNA, HEYMANN ([id number]) [id number] Page 2 of 7 05-11-2022 upon evaluation today patient appears to be doing well with regard to her wound. She is showing signs of improvement a lot of the necrotic tissue is loosening up and she  is showing more think good granulation tissue starting to poke through. With that being said I do not see any evidence of active  infection locally or systemically at this time. 05-25-2022 upon evaluation today patient appears to be doing well currently in regard to her wound. We are actually making signs of good progress. Fortunately I do not see any evidence of active infection which is great. She does have more granulation tissue starting to show itself and I am happy in that regard. I do believe the Santyl however still probably her best bet based on what we are seeing. 06-08-2022 upon evaluation today patient appears to be doing better in regard to her wound she is actually showing signs of good improvement which is great news and overall I am extremely pleased with where things stand today. There does not appear to be any evidence of active infection locally or systemically at this time which is great news. No fevers, chills, nausea, vomiting, or diarrhea. 06-29-2022 upon evaluation today patient appears to be doing well currently in regard to her wound. This actually has some granulation budding but also is very slow to heal. She has been using the collagen although she did run out and went back to the [employer] after she ran out of the [place]. Nonetheless she tells me it was burning some although she was using some colloidal silver. That was being used instead of the saline and she is not sure if that coupled with the silver in the Prisma was too much or what. 07-13-2022 upon evaluation today patient appears to be doing well currently in regard to her leg ulcer this is measuring smaller and looking much better. Fortunately I do not see any evidence of infection locally or systemically at this time which is great news. No fevers, chills, nausea, vomiting, or diarrhea. 07-28-2022 upon evaluation today patient appears to be doing well currently in regard to her wound. She has been tolerating the dressing changes without complication. This is actually measuring much smaller looking much better and overall I think we are getting  very close to complete resolution. Electronic Signature(s) Signed: 07/28/2022 3:57:46 PM By: Worthy Keeler PA-C Entered By: Worthy Keeler on 07/28/2022 15:57:46 -------------------------------------------------------------------------------- Physical Exam Details Patient Name: Date of Service: OLAYINKA, GATHERS 07/28/2022 3:00 PM Medical Record Number: [id number] Patient Account Number: [id number] Date of Birth/Sex: Treating RN: Dec 05, 1965 (56 y.o. Orvan Falconer Primary Care Provider: SYSTEM, PCP Other Clinician: Massie Kluver Referring Provider: Treating Provider/Extender: Vladimir Faster in Treatment: 19 Constitutional Well-nourished and well-hydrated in no acute distress. Respiratory normal breathing without difficulty. Psychiatric this patient is able to make decisions and demonstrates good insight into disease process. Alert and Oriented x 3. pleasant and cooperative. Notes Upon inspection patient's wound bed actually showed signs of good granulation and epithelization at this point. Fortunately I do not see any evidence of infection at this time which is great news. No fevers, chills, nausea, vomiting, or diarrhea. Electronic Signature(s) Signed: 07/28/2022 3:58:04 PM By: Worthy Keeler PA-C Entered By: Worthy Keeler on 07/28/2022 15:58:04 [name] ([id number]) [id number] Page 3 of 7 -------------------------------------------------------------------------------- Physician Orders Details Patient Name: Date of Service: JANAVIA, ROTTMAN 07/28/2022 3:00 PM Medical Record Number: [id number] Patient Account Number: [id number] Date of Birth/Sex: Treating RN: [birth year] (56 y.o. Orvan Falconer Primary Care Provider: SYSTEM, PCP Other Clinician: Massie Kluver Referring Provider: Treating Provider/Extender: Vladimir Faster in Treatment: [phone] Verbal / Phone Orders: No Diagnosis Coding  ICD-10 Coding Code  Description E11.622 Type 2 diabetes mellitus with other skin ulcer L97.828 Non-pressure chronic ulcer of other part of left lower leg with other specified severity I10 Essential (primary) hypertension N18.6 End stage renal disease Z99.2 Dependence on renal dialysis Follow-up Appointments Return Appointment in 2 weeks. Bathing/ Shower/ Hygiene May shower; gently cleanse wound with antibacterial soap, rinse and pat dry prior to dressing wounds Anesthetic (Use 'Patient Medications' Section for Anesthetic Order Entry) Lidocaine applied to wound bed Wound Treatment Wound #1 - Lower Leg Wound Laterality: Left, Posterior Cleanser: Byram Ancillary Kit - 15 Day Supply (Generic) Every Other Day/30 Days Discharge Instructions: Use supplies as instructed; Kit contains: (15) Saline Bullets; (15) 3x3 Gauze; 15 pr Gloves Prim Dressing: Prisma 4.34 (in) Every Other Day/30 Days ary Discharge Instructions: Moisten w/normal saline or sterile water; Cover wound as directed. Do not remove from wound bed. Secondary Dressing: telfa non-adherent pad Every Other Day/30 Days Discharge Instructions: cut small piece to fit over wound Secured With: Tegaderm Film 4x4 (in/in) Every Other Day/30 Days Discharge Instructions: Apply to wound bed Electronic Signature(s) Signed: 07/28/2022 4:53:03 PM By: Worthy Keeler PA-C Signed: 07/28/2022 5:05:44 PM By: Massie Kluver Entered By: Massie Kluver on 07/28/2022 15:57:13 Mcclay, Lashaunta ([id number]) [id number] Page 4 of 7 -------------------------------------------------------------------------------- Problem List Details Patient Name: Date of Service: BETHANY, HIRT 07/28/2022 3:00 PM Medical Record Number: [id number] Patient Account Number: [id number] Date of Birth/Sex: Treating RN: 1965/10/20 (56 y.o. Orvan Falconer Primary Care Provider: SYSTEM, PCP Other Clinician: Massie Kluver Referring Provider: Treating Provider/Extender: Kathrynn Running Weeks in Treatment: 15 Active Problems ICD-10 Encounter Code Description Active Date MDM Diagnosis E11.622 Type 2 diabetes mellitus with other skin ulcer 04/14/2022 No Yes L97.828 Non-pressure chronic ulcer of other part of left lower leg with other specified 04/14/2022 No Yes severity I10 Essential (primary) hypertension 04/14/2022 No Yes N18.6 End stage renal disease 04/14/2022 No Yes Z99.2 Dependence on renal dialysis 04/14/2022 No Yes Inactive Problems Resolved Problems Electronic Signature(s) Signed: 07/28/2022 3:09:55 PM By: Worthy Keeler PA-C Entered By: Worthy Keeler on 07/28/2022 15:09:55 -------------------------------------------------------------------------------- Progress Note Details Patient Name: Date of Service: [name], [place] 07/28/2022 3:00 PM Medical Record Number: [id number] Patient Account Number: [id number] Date of Birth/Sex: Treating RN: 1966/07/10 (56 y.o. Orvan Falconer [place], [place] ([id number]) [id number] Page 5 of 7 Primary Care Provider: SYSTEM, PCP Other Clinician: Massie Kluver Referring Provider: Treating Provider/Extender: Vladimir Faster in Treatment: 15 Subjective Chief Complaint Information obtained from Patient Ulcer left leg posteriorly History of Present Illness (HPI) 04-14-2022 upon evaluation today patient presents for initial inspection here in the clinic concerning issues that she has been having with the wound over the left posterior lower extremity. This started out as a painful red burning area which she tells me has developed into more of an eschar covered region. 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 With that being said I discussed with her today what exactly that is and what it means. Also discussed that she is going to likely need to be closely monitored to ensure this does not get worse. I did perform a debridement today but I tried  to be as careful as possible with this debridement in order to not cause anything to worsen nonetheless we needed to get some of the eschar off. The patient does have a history again of having end-stage renal disease for which she is on dialysis, she has broken both hips and had bilateral  hip replacements. She also has been on doxycycline which was started on 03-27-2022 which has not really made a significant improvement. She is also been on mupirocin topically which is not helping. She has been using some essential oil which she feels like made it feel little bit better but still this does not seem to be getting better. Overall she has become very frustrated with the situation. 04-21-2022 upon evaluation today patient appears to be doing well currently with regard to her wound. She has been tolerating the dressing changes using just the mupirocin we have been waiting on the Santyl. I [facility] ended up having a going through but at the same time she is not able to actually get that currently due to the fact that she has been told that I did not "write the prescription correctly and it will not go through her insurance that way". With that being said I am not exactly sure what could be wrong about it is actually a macro that I used to put the prescription in the dosing is appropriate and I even put the measurements on the prescription for the wound size which is needed as well. With that being said I am going to call the pharmacy today while the patient is here in the clinic to verify what is going on and make sure we get this taken care of. 05-11-2022 upon evaluation today patient appears to be doing well with regard to her wound. She is showing signs of improvement a lot of the necrotic tissue is loosening up and she is showing more think good granulation tissue starting to poke through. With that being said I do not see any evidence of active infection locally or  systemically at this time. 05-25-2022 upon evaluation today patient appears to be doing well currently in regard to her wound. We are actually making signs of good progress. Fortunately I do not see any evidence of active infection which is great. She does have more granulation tissue starting to show itself and I am happy in that regard. I do believe the Santyl however still probably her best bet based on what we are seeing. 06-08-2022 upon evaluation today patient appears to be doing better in regard to her wound she is actually showing signs of good improvement which is great news and overall I am extremely pleased with where things stand today. There does not appear to be any evidence of active infection locally or systemically at this time which is great news. No fevers, chills, nausea, vomiting, or diarrhea. 06-29-2022 upon evaluation today patient appears to be doing well currently in regard to her wound. This actually has some granulation budding but also is very slow to heal. She has been using the collagen although she did run out and went back to the [place] after she ran out of the [place]. Nonetheless she tells me it was burning some although she was using some colloidal silver. That was being used instead of the saline and she is not sure if that coupled with the silver in the Prisma was too much or what. 07-13-2022 upon evaluation today patient appears to be doing well currently in regard to her leg ulcer this is measuring smaller and looking much better. Fortunately I do not see any evidence of infection locally or systemically at this time which is great news. No fevers, chills, nausea, vomiting, or diarrhea. 07-28-2022 upon evaluation today patient appears to be doing well  currently in regard to her wound. She has been tolerating the dressing changes without complication. This is actually measuring much smaller looking much better and overall I think we are getting very close to complete  resolution. Objective Constitutional Well-nourished and well-hydrated in no acute distress. Vitals Time Taken: 3:24 PM, Height: 67 in, Weight: 164 lbs, BMI: 25.7, Temperature: 97.8 F, Pulse: 92 bpm, Respiratory Rate: 16 breaths/min, Blood Pressure: 175/92 mmHg. Respiratory normal breathing without difficulty. Psychiatric this patient is able to make decisions and demonstrates good insight into disease process. Alert and Oriented x 3. pleasant and cooperative. General Notes: Upon inspection patient's wound bed actually showed signs of good granulation and epithelization at this point. Fortunately I do not see any evidence of infection at this time which is great news. No fevers, chills, nausea, vomiting, or diarrhea. Integumentary (Hair, Skin) Wound #1 status is Open. Original cause of wound was Gradually Appeared. The date acquired was: 02/23/2022. The wound has been in treatment 15 weeks. The wound is located on the Left,Posterior Lower Leg. The wound measures 0.5cm length x 0.5cm width x 0.1cm depth; 0.196cm^2 area and 0.02cm^3 volume. There is no tunneling or undermining noted. There is a medium amount of serosanguineous drainage noted. 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 Bathing/ Shower/ Hygiene: May shower; gently cleanse wound with antibacterial soap, rinse and pat dry prior to dressing wounds Anesthetic (Use 'Patient Medications' Section for Anesthetic Order Entry): Lidocaine applied to wound bed WOUND #1: - Lower Leg Wound Laterality: Left, Posterior Cleanser: Byram Ancillary Kit - 15 Day Supply (Generic) Every Other Day/30  Days Discharge Instructions: Use supplies as instructed; Kit contains: (15) Saline Bullets; (15) 3x3 Gauze; 15 pr Gloves Prim Dressing: Prisma 4.34 (in) Every Other Day/30 Days ary Discharge Instructions: Moisten w/normal saline or sterile water; Cover wound as directed. Do not remove from wound bed. Secondary Dressing: telfa non-adherent pad Every Other Day/30 Days Discharge Instructions: cut small piece to fit over wound Secured With: T egaderm Film 4x4 (in/in) Every Other Day/30 Days Discharge Instructions: Apply to wound bed 1. Based on what I see I do believe that the patient would benefit currently from using the silver collagen which has been beneficial to continue with that plan currently. 2. I am also can recommend that we have the patient continue to monitor for any signs of worsening or infection. Obviously if anything changes she knows she can contact the office and let me know. We will see patient back for reevaluation in 2 weeks here in the clinic. If anything worsens or changes patient will contact our office for additional recommendations. Electronic Signature(s) Signed: 07/28/2022 3:59:39 PM By: Worthy Keeler PA-C Entered By: Worthy Keeler on 07/28/2022 15:59:39 -------------------------------------------------------------------------------- SuperBill Details Patient Name: Date of Service: [name], [place] 07/28/2022 Medical Record Number: [id number] Patient Account Number: [id number] Date of Birth/Sex: Treating RN: Oct 30, 1965 (56 y.o. Orvan Falconer Primary Care Provider: SYSTEM, PCP Other Clinician: Massie Kluver Referring Provider: Treating Provider/Extender: Vladimir Faster in Treatment: 15 Diagnosis Coding ICD-10 Codes Code Description E11.622 Type 2 diabetes mellitus with other skin ulcer L97.828 Non-pressure chronic ulcer of other part of left lower leg with other specified severity I10 Essential (primary) hypertension Rudell, Soha  (  [id number]) [id number] Page 7 of 7 N18.6 End stage renal disease Z99.2 Dependence on renal dialysis Facility Procedures : CPT4 Code: [id number] Description: 70761 - WOUND CARE VISIT-LEV 3 EST PT Modifier: Quantity: 1 Physician Procedures : CPT4 Code Description Modifier [id number] - WC PHYS LEVEL 3 - EST PT ICD-10 Diagnosis Description E11.622 Type 2 diabetes mellitus with other skin ulcer L97.828 Non-pressure chronic ulcer of other part of left lower leg with other specified severity  I10 Essential (primary) hypertension N18.6 End stage renal disease Quantity: 1 Electronic Signature(s) Signed: 07/28/2022 3:59:50 PM By: Worthy Keeler PA-C Entered By: Worthy Keeler on 07/28/2022 15:59:49

## 2022-08-11 ENCOUNTER — Encounter: Payer: Medicare Other | Attending: Physician Assistant | Admitting: Physician Assistant

## 2022-08-11 DIAGNOSIS — L97828 Non-pressure chronic ulcer of other part of left lower leg with other specified severity: Secondary | ICD-10-CM | POA: Insufficient documentation

## 2022-08-11 DIAGNOSIS — I120 Hypertensive chronic kidney disease with stage 5 chronic kidney disease or end stage renal disease: Secondary | ICD-10-CM | POA: Insufficient documentation

## 2022-08-11 DIAGNOSIS — Z96643 Presence of artificial hip joint, bilateral: Secondary | ICD-10-CM | POA: Diagnosis not present

## 2022-08-11 DIAGNOSIS — E1122 Type 2 diabetes mellitus with diabetic chronic kidney disease: Secondary | ICD-10-CM | POA: Insufficient documentation

## 2022-08-11 DIAGNOSIS — E11622 Type 2 diabetes mellitus with other skin ulcer: Secondary | ICD-10-CM | POA: Insufficient documentation

## 2022-08-11 DIAGNOSIS — N186 End stage renal disease: Secondary | ICD-10-CM | POA: Insufficient documentation

## 2022-08-11 DIAGNOSIS — Z992 Dependence on renal dialysis: Secondary | ICD-10-CM | POA: Insufficient documentation

## 2022-08-11 NOTE — Progress Notes (Addendum)
ANZLEIGH, SLAVEN ([id number]) [id number] Page 1 of 7 Visit Report for 08/11/2022 Chief Complaint Document Details Patient Name: Date of Service: [name], [name] 08/11/2022 3:00 PM Medical Record Number: [id number] Patient Account Number: [id number] Date of Birth/Sex: Treating RN: 1965/10/04 (56 y.o. Orvan Falconer Primary Care Provider: SYSTEM, PCP Other Clinician: Massie Kluver Referring Provider: Treating Provider/Extender: Vladimir Faster in Treatment: 17 Information Obtained from: Patient Chief Complaint Ulcer left leg posteriorly Electronic Signature(s) Signed: 08/11/2022 3:06:54 PM By: Worthy Keeler PA-C Entered By: Worthy Keeler on 08/11/2022 15:06:54 -------------------------------------------------------------------------------- HPI Details Patient Name: Date of Service: [name], [name] 08/11/2022 3:00 PM Medical Record Number: [id number] Patient Account Number: [id number] Date of Birth/Sex: Treating RN: 02-02-1966 (56 y.o. Orvan Falconer Primary Care Provider: SYSTEM, PCP Other Clinician: Massie Kluver Referring Provider: Treating Provider/Extender: Vladimir Faster in Treatment: 17 History of Present Illness HPI Description: 04-14-2022 upon evaluation today patient presents for initial inspection here in the clinic concerning issues that she has been having with the wound over the left posterior lower extremity. This started out as a painful red burning area which she tells me has developed into more of an eschar covered region. 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 With that being said I discussed with her today what exactly that is and what it means. Also discussed that she is going to likely need to be closely monitored to ensure this does not get worse. I did perform a debridement today but I tried to be as careful as possible with this debridement in order to not cause  anything to worsen nonetheless we needed to get some of the eschar off. 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 She also has been on doxycycline which was started on 03-27-2022 which has not really made a significant improvement. She is also been on mupirocin topically which is not helping. She has been using some essential oil which she feels like made it feel little bit better but still this does not seem to be getting better. Overall she has become very frustrated with the situation. 04-21-2022 upon evaluation today patient appears to be doing well currently with regard to her wound. She has been tolerating the dressing changes using just the mupirocin we have been waiting on the Santyl. I tried multiple places to send [facility] as well ended up having a going through but at the same time she is not able to actually get that currently due to the fact that she has been told that I did not "write the prescription correctly and it will not go through her insurance that way". With that being said I am not exactly sure what could be wrong about it is actually a macro that I used to put the prescription in the dosing is appropriate and I even put the measurements on the prescription for the wound size which is needed as well. With that being said I am going to call the pharmacy today while the patient is here in the clinic to verify what is going on and make sure we get this taken care of. [name], [name] ([id number]) [id number] Page 2 of 7 05-11-2022 upon evaluation today patient appears to be doing well with regard to her wound. She is showing signs of improvement a lot of the necrotic tissue is loosening up and she  is showing more think good granulation tissue starting to poke through. With that being said I do not see any evidence of active  infection locally or systemically at this time. 05-25-2022 upon evaluation today patient appears to be doing well currently in regard to her wound. We are actually making signs of good progress. Fortunately I do not see any evidence of active infection which is great. She does have more granulation tissue starting to show itself and I am happy in that regard. I do believe the Santyl however still probably her best bet based on what we are seeing. 06-08-2022 upon evaluation today patient appears to be doing better in regard to her wound she is actually showing signs of good improvement which is great news and overall I am extremely pleased with where things stand today. There does not appear to be any evidence of active infection locally or systemically at this time which is great news. No fevers, chills, nausea, vomiting, or diarrhea. 06-29-2022 upon evaluation today patient appears to be doing well currently in regard to her wound. This actually has some granulation budding but also is very slow to heal. She has been using the collagen although she did run out and went back to the [place] after she ran out of the [place]. Nonetheless she tells me it was burning some although she was using some colloidal silver. That was being used instead of the saline and she is not sure if that coupled with the silver in the Prisma was too much or what. 07-13-2022 upon evaluation today patient appears to be doing well currently in regard to her leg ulcer this is measuring smaller and looking much better. Fortunately I do not see any evidence of infection locally or systemically at this time which is great news. No fevers, chills, nausea, vomiting, or diarrhea. 07-28-2022 upon evaluation today patient appears to be doing well currently in regard to her wound. She has been tolerating the dressing changes without complication. This is actually measuring much smaller looking much better and overall I think we are getting  very close to complete resolution. 08-11-2022 upon inspection patient's wound bed is actually showing signs of excellent improvement and overall I am extremely pleased with where we stand currently. Fortunately there does not appear to be any signs of infection locally nor systemically which is great news. Electronic Signature(s) Signed: 08/11/2022 4:49:19 PM By: Worthy Keeler PA-C Entered By: Worthy Keeler on 08/11/2022 16:49:18 -------------------------------------------------------------------------------- Physical Exam Details Patient Name: Date of Service: [name], [name] 08/11/2022 3:00 PM Medical Record Number: [id number] Patient Account Number: [id number] Date of Birth/Sex: Treating RN: 1966/02/15 (56 y.o. Orvan Falconer Primary Care Provider: SYSTEM, PCP Other Clinician: Massie Kluver Referring Provider: Treating Provider/Extender: Vladimir Faster in Treatment: 33 Constitutional Well-nourished and well-hydrated in no acute distress. Respiratory normal breathing without difficulty. Psychiatric this patient is able to make decisions and demonstrates good insight into disease process. Alert and Oriented x 3. pleasant and cooperative. Notes Patient's wound again showed evidence of excellent epithelization actually think she is making great progress here and I do not see any signs of active infection at this time overall I am extremely happy with where we stand. Electronic Signature(s) Signed: 08/11/2022 4:49:37 PM By: Worthy Keeler PA-C Entered By: Worthy Keeler on 08/11/2022 16:49:37 [name], [name] ([id number]) [id number] Page 3 of 7 -------------------------------------------------------------------------------- Physician Orders Details Patient Name: Date of Service: [name], [name] 08/11/2022 3:00 PM Medical Record Number: [id number] Patient Account  Number: [id number] Date of Birth/Sex: Treating RN: [birth year] (56 y.o. Orvan Falconer Primary Care Provider: SYSTEM, PCP Other Clinician: Massie Kluver Referring Provider: Treating Provider/Extender: Vladimir Faster in Treatment: 17 Verbal / Phone Orders: No Diagnosis Coding ICD-10 Coding Code Description E11.622 Type 2 diabetes mellitus with other skin ulcer L97.828 Non-pressure chronic ulcer of other part of left lower leg with other specified severity I10 Essential (primary) hypertension N18.6 End stage renal disease Z99.2 Dependence on renal dialysis Follow-up Appointments Return Appointment in 2 weeks. Bathing/ Shower/ Hygiene May shower; gently cleanse wound with antibacterial soap, rinse and pat dry prior to dressing wounds Anesthetic (Use 'Patient Medications' Section for Anesthetic Order Entry) Lidocaine applied to wound bed Wound Treatment Wound #1 - Lower Leg Wound Laterality: Left, Posterior Cleanser: Byram Ancillary Kit - 15 Day Supply (Generic) Every Other Day/30 Days Discharge Instructions: Use supplies as instructed; Kit contains: (15) Saline Bullets; (15) 3x3 Gauze; 15 pr Gloves Prim Dressing: Prisma 4.34 (in) Every Other Day/30 Days ary Discharge Instructions: Moisten w/normal saline or sterile water; Cover wound as directed. Do not remove from wound bed. Secondary Dressing: telfa non-adherent pad Every Other Day/30 Days Discharge Instructions: cut small piece to fit over wound Secured With: Tegaderm Film 4x4 (in/in) Every Other Day/30 Days Discharge Instructions: Apply to wound bed Electronic Signature(s) Signed: 08/11/2022 5:45:58 PM By: Worthy Keeler PA-C Signed: 08/18/2022 4:45:28 PM By: Massie Kluver Entered By: Massie Kluver on 08/11/2022 Hilliard, Murphy ([id number]) [id number] Page 4 of 7 -------------------------------------------------------------------------------- Problem List Details Patient Name: Date of Service: [name], [name] 08/11/2022 3:00 PM Medical  Record Number: [id number] Patient Account Number: [id number] Date of Birth/Sex: Treating RN: 1966/04/02 (56 y.o. Orvan Falconer Primary Care Provider: SYSTEM, PCP Other Clinician: Massie Kluver Referring Provider: Treating Provider/Extender: Vladimir Faster in Treatment: 17 Active Problems ICD-10 Encounter Code Description Active Date MDM Diagnosis E11.622 Type 2 diabetes mellitus with other skin ulcer 04/14/2022 No Yes L97.828 Non-pressure chronic ulcer of other part of left lower leg with other specified 04/14/2022 No Yes severity I10 Essential (primary) hypertension 04/14/2022 No Yes N18.6 End stage renal disease 04/14/2022 No Yes Z99.2 Dependence on renal dialysis 04/14/2022 No Yes Inactive Problems Resolved Problems Electronic Signature(s) Signed: 08/11/2022 3:06:51 PM By: Worthy Keeler PA-C Entered By: Worthy Keeler on 08/11/2022 15:06:51 -------------------------------------------------------------------------------- Progress Note Details Patient Name: Date of Service: [name], [name] 08/11/2022 3:00 PM Medical Record Number: [id number] Patient Account Number: [id number] Date of Birth/Sex: Treating RN: 31-Aug-1966 (56 y.o. Orvan Falconer [place], [place] ([id number]) [id number] Page 5 of 7 Primary Care Provider: SYSTEM, PCP Other Clinician: Massie Kluver Referring Provider: Treating Provider/Extender: Vladimir Faster in Treatment: 17 Subjective Chief Complaint Information obtained from Patient Ulcer left leg posteriorly History of Present Illness (HPI) 04-14-2022 upon evaluation today patient presents for initial inspection here in the clinic concerning issues that she has been having with the wound over the left posterior lower extremity. This started out as a painful red burning area which she tells me has developed into more of an eschar covered region. This honestly sounds like it could potentially be  calciphylaxis. With that being said I discussed with her today what exactly that is and what it means. Also discussed that she is going to likely need to be closely monitored to ensure this does not get worse. I did perform a debridement today but I tried to be as careful as possible with this debridement in order to  not cause anything to worsen nonetheless we needed to get some of the eschar off. 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 She also has been on doxycycline which was started on 03-27-2022 which has not really made a significant improvement. She is also been on mupirocin topically which is not helping. She has been using some essential oil which she feels like made it feel little bit better but still this does not seem to be getting better. Overall she has become very frustrated with the situation. 04-21-2022 upon evaluation today patient appears to be doing well currently with regard to her wound. She has been tolerating the dressing changes using just the mupirocin we have been waiting on the Santyl. I [facility] multiple [facility] ended up having a going through but at the same time she is not able to actually get that currently due to the fact that she has been told that I did not "write the prescription correctly and it will not go through her insurance that way". With that being said I am not exactly sure what could be wrong about it is actually a macro that I used to put the prescription in the dosing is appropriate and I even put the measurements on the prescription for the wound size which is needed as well. With that being said I am going to call the pharmacy today while the patient is here in the clinic to verify what is going on and make sure we get this taken care of. 05-11-2022 upon evaluation today patient appears to be doing well with  regard to her wound. She is showing signs of improvement a lot of the necrotic tissue is loosening up and she is showing more think good granulation tissue starting to poke through. With that being said I do not see any evidence of active infection locally or systemically at this time. 05-25-2022 upon evaluation today patient appears to be doing well currently in regard to her wound. We are actually making signs of good progress. Fortunately I do not see any evidence of active infection which is great. She does have more granulation tissue starting to show itself and I am happy in that regard. I do believe the Santyl however still probably her best bet based on what we are seeing. 06-08-2022 upon evaluation today patient appears to be doing better in regard to her wound she is actually showing signs of good improvement which is great news and overall I am extremely pleased with where things stand today. There does not appear to be any evidence of active infection locally or systemically at this time which is great news. No fevers, chills, nausea, vomiting, or diarrhea. 06-29-2022 upon evaluation today patient appears to be doing well currently in regard to her wound. This actually has some granulation budding but also is very slow to heal. She has been using the collagen although she did run out and went back to the [place] after she ran out of the [place]. Nonetheless she tells me it was burning some although she was using some colloidal silver. That was being used instead of the saline and she is not sure if that coupled with the silver in the Prisma was too much or what. 07-13-2022 upon evaluation today patient appears to be doing well currently in regard to her leg ulcer this is measuring  smaller and looking much better. Fortunately I do not see any evidence of infection locally or systemically at this time which is great news. No fevers, chills, nausea, vomiting, or diarrhea. 07-28-2022 upon  evaluation today patient appears to be doing well currently in regard to her wound. She has been tolerating the dressing changes without complication. This is actually measuring much smaller looking much better and overall I think we are getting very close to complete resolution. 08-11-2022 upon inspection patient's wound bed is actually showing signs of excellent improvement and overall I am extremely pleased with where we stand currently. Fortunately there does not appear to be any signs of infection locally nor systemically which is great news. Objective Constitutional Well-nourished and well-hydrated in no acute distress. Vitals Time Taken: 3:35 PM, Height: 67 in, Weight: 164 lbs, BMI: 25.7, Temperature: 98.2 F, Pulse: 96 bpm, Respiratory Rate: 16 breaths/min, Blood Pressure: 172/96 mmHg. Respiratory normal breathing without difficulty. Psychiatric this patient is able to make decisions and demonstrates good insight into disease process. Alert and Oriented x 3. pleasant and cooperative. General Notes: Patient's wound again showed evidence of excellent epithelization actually think she is making great progress here and I do not see any signs of active infection at this time overall I am extremely happy with where we stand. Integumentary (Hair, Skin) Wound #1 status is Open. Original cause of wound was Gradually Appeared. The date acquired was: 02/23/2022. The wound has been in treatment 17 weeks. The wound is located on the Left,Posterior Lower Leg. The wound measures 0.5cm length x 0.5cm width x 0.1cm depth; 0.196cm^2 area and 0.02cm^3 volume. There is no tunneling or undermining noted. There is a medium amount of serosanguineous drainage noted. 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 Bathing/ Shower/ Hygiene: May shower; gently cleanse wound with antibacterial soap, rinse and pat dry prior to dressing wounds Anesthetic (Use 'Patient Medications' Section for Anesthetic Order Entry): Lidocaine applied to wound bed WOUND #1: - Lower Leg Wound Laterality: Left, Posterior Cleanser: Byram Ancillary Kit - 15 Day Supply (Generic) Every Other Day/30 Days Discharge Instructions: Use supplies as instructed; Kit contains: (15) Saline Bullets; (15) 3x3 Gauze; 15 pr Gloves Prim Dressing: Prisma 4.34 (in) Every Other Day/30 Days ary Discharge Instructions: Moisten w/normal saline or sterile water; Cover wound as directed. Do not remove from wound bed. Secondary Dressing: telfa non-adherent pad Every Other Day/30 Days Discharge Instructions: cut small piece to fit over wound Secured With: T egaderm Film 4x4 (in/in) Every Other Day/30 Days Discharge Instructions: Apply to wound bed 1. I am going to recommend that we have the patient continue to monitor for any evidence of infection or worsening for the time being we will get a continue with the Prisma followed by the T island dressing with T elfa egaderm which seems to be doing awesome. 2. I am also can recommend that we have the patient continue to elevate her leg is much as possible to help with edema control I think this still to be of utmost importance. 3. I would also suggest patient should continue to change this regularly at home which she seems to be doing excellent with an overall I think you are making great progress. We will see patient  back for reevaluation in 1 week here in the clinic. If anything worsens or changes patient will contact our office for additional recommendations. Electronic Signature(s) Signed: 08/11/2022 4:50:35 PM By: Worthy Keeler PA-C Entered  By: Worthy Keeler on 08/11/2022 16:50:35 -------------------------------------------------------------------------------- SuperBill Details Patient Name: Date of Service: [name], [name] 08/11/2022 Medical Record Number: [id number] Patient Account Number: [id number] Date of Birth/Sex: Treating RN: [birth year] (56 y.o. Orvan Falconer Primary Care Provider: SYSTEM, PCP Other Clinician: Massie Kluver Referring Provider: Treating Provider/Extender: Kathrynn Running Weeks in Treatment: 17 Diagnosis Coding ICD-10 Codes Code Description ANIYA, JOLICOEUR ([id number]) [id number] Page 7 of 7 E11.622 Type 2 diabetes mellitus with other skin ulcer L97.828 Non-pressure chronic ulcer of other part of left lower leg with other specified severity I10 Essential (primary) hypertension N18.6 End stage renal disease Z99.2 Dependence on renal dialysis Facility Procedures : CPT4 Code: [id number] Description: 99213 - WOUND CARE VISIT-LEV 3 EST PT Modifier: Quantity: 1 Physician Procedures : CPT4 Code Description Modifier [id number] 99213 - WC PHYS LEVEL 3 - EST PT ICD-10 Diagnosis Description E11.622 Type 2 diabetes mellitus with other skin ulcer L97.828 Non-pressure chronic ulcer of other part of left lower leg with other specified severity  I10 Essential (primary) hypertension N18.6 End stage renal disease Quantity: 1 Electronic Signature(s) Signed: 08/11/2022 4:50:51 PM By: Worthy Keeler PA-C Entered By: Worthy Keeler on 08/11/2022 16:50:50

## 2022-08-11 NOTE — Progress Notes (Addendum)
BIRD, TAILOR ([id number]) [id number] Page 1 of 8 Visit Report for 08/11/2022 Arrival Information Details Patient Name: Date of Service: [name], QUINTIN 08/11/2022 3:00 PM Medical Record Number: [id number] Patient Account Number: [id number] Date of Birth/Sex: Treating RN: 01-02-1966 (56 y.o. Orvan Falconer Primary Care Symon Norwood: SYSTEM, PCP Other Clinician: Massie Kluver Referring Shakyla Nolley: Treating Luvena Wentling/Extender: Vladimir Faster in Treatment: 17 Visit Information History Since Last Visit All ordered tests and consults were completed: No Patient Arrived: Crutches Added or deleted any medications: No Arrival Time: 15:34 Any new allergies or adverse reactions: No Transfer Assistance: None Had a fall or experienced change in No Patient Identification Verified: Yes activities of daily living that may affect Secondary Verification Process Completed: Yes risk of falls: Patient Requires Transmission-Based Precautions: No Signs or symptoms of abuse/neglect since last visito No Patient Has Alerts: Yes Hospitalized since last visit: No Patient Alerts: Diabetic Type II Has Dressing in Place as Prescribed: Yes Pain Present Now: No Electronic Signature(s) Signed: 08/18/2022 4:45:28 PM By: Massie Kluver Entered By: Massie Kluver on 08/11/2022 15:34:58 -------------------------------------------------------------------------------- Clinic Level of Care Assessment Details Patient Name: Date of Service: [name], [name] 08/11/2022 3:00 PM Medical Record Number: [id number] Patient Account Number: [id number] Date of Birth/Sex: Treating RN: Dec 06, 1965 (56 y.o. Orvan Falconer Primary Care Ameia Morency: SYSTEM, PCP Other Clinician: Massie Kluver Referring Jyra Lagares: Treating Negar Sieler/Extender: Vladimir Faster in Treatment: 17 Clinic Level of Care Assessment Items TOOL 4 Quantity Score _0  - 0 Use when only an EandM is performed  on FOLLOW-UP visit ASSESSMENTS - Nursing Assessment / Reassessment X- 1 10 Reassessment of Co-morbidities (includes updates in patient status) X- 1 5 Reassessment of Adherence to Treatment Plan ASSESSMENTS - Wound and Skin A ssessment / Reassessment X - Simple Wound Assessment / Reassessment - one wound 1 5 Bergeson, [place] ([id number]) [id number] Page 2 of 8 _1  - 0 Complex Wound Assessment / Reassessment - multiple wounds _2  - 0 Dermatologic / Skin Assessment (not related to wound area) ASSESSMENTS - Focused Assessment _3  - 0 Circumferential Edema Measurements - multi extremities _4  - 0 Nutritional Assessment / Counseling / Intervention _5  - 0 Lower Extremity Assessment (monofilament, tuning fork, pulses) _6  - 0 Peripheral Arterial Disease Assessment (using hand held doppler) ASSESSMENTS - Ostomy and/or Continence Assessment and Care _7  - 0 Incontinence Assessment and Management _8  - 0 Ostomy Care Assessment and Management (repouching, etc.) PROCESS - Coordination of Care X - Simple Patient / Family Education for ongoing care 1 15 _9  - 0 Complex (extensive) Patient / Family Education for ongoing care _10  - 0 Staff obtains Programmer, systems, Records, T Results / Process Orders est _11  - 0 Staff telephones HHA, Nursing Homes / Clarify orders / etc _12  - 0 Routine Transfer to another Facility (non-emergent condition) _13  - 0 Routine Hospital Admission (non-emergent condition) _14  - 0 New Admissions / Biomedical engineer / Ordering NPWT Apligraf, etc. , _15  - 0 Emergency Hospital Admission (emergent condition) X- 1 10 Simple Discharge Coordination _16  - 0 Complex (extensive) Discharge Coordination PROCESS - Special Needs _17  - 0 Pediatric / Minor Patient Management _18  - 0 Isolation Patient Management _19  - 0 Hearing / Language / Visual special needs _20  - 0 Assessment of Community assistance (transportation, D/C planning, etc.) _21  - 0 Additional  assistance / Altered mentation _22  - 0 Support Surface(s) Assessment (bed, cushion, seat, etc.) INTERVENTIONS - Wound Cleansing / Measurement X - Simple Wound Cleansing - one wound 1 5 _23  - 0 Complex Wound Cleansing -  multiple wounds X- 1 5 Wound Imaging (photographs - any number of wounds) _0  - 0 Wound Tracing (instead of photographs) X- 1 5 Simple Wound Measurement - one wound _1  - 0 Complex Wound Measurement - multiple wounds INTERVENTIONS - Wound Dressings _2  - 0 Small Wound Dressing one or multiple wounds X- 1 15 Medium Wound Dressing one or multiple wounds _3  - 0 Large Wound Dressing one or multiple wounds <XNATFTDDUKGURKYH>_0<\/WCBJSEGBTDVVOHYW>_7  - 0 Application of Medications - topical <PXTGGYIRSWNIOEVO>_3<\/JKKXFGHWEXHBZJIR>_6  - 0 Application of Medications - injection INTERVENTIONS - Miscellaneous _6  - 0 External ear exam _7  - 0 Specimen Collection (cultures, biopsies, blood, body fluids, etc.) Ullom, Lahela ([id number]) [id number] Page 3 of 8 _8  - 0 Specimen(s) / Culture(s) sent or taken to Lab for analysis _9  - 0 Patient Transfer (multiple staff / Harrel Lemon Lift / Similar devices) _10  - 0 Simple Staple / Suture removal (25 or less) _11  - 0 Complex Staple / Suture removal (26 or more) _12  - 0 Hypo / Hyperglycemic Management (close monitor of Blood Glucose) _13  - 0 Ankle / Brachial Index (ABI) - do not check if billed separately X- 1 5 Vital Signs Has the patient been seen at the hospital within the last three years: Yes Total Score: 80 Level Of Care: New/Established - Level 3 Electronic Signature(s) Signed: 08/18/2022 4:45:28 PM By: Massie Kluver Entered By: Massie Kluver on 08/11/2022 16:42:05 -------------------------------------------------------------------------------- Complex / Palliative Patient Assessment Details Patient Name: Date of Service: [name], [name] 08/11/2022 3:00 PM Medical Record Number: [id number] Patient Account Number: [id number] Date of Birth/Sex: Treating RN: 09/13/1965 (56 y.o. Marlowe [place]ores Primary Care Natalina Wieting: SYSTEM, PCP Other Clinician: Massie Kluver Referring Cara Aguino: Treating Mykai Wendorf/Extender: Vladimir Faster in Treatment: 17 Complex Wound Management Criteria Patient has remarkable or complex co-morbidities requiring medications or treatments that extend wound healing times. Examples: Diabetes mellitus with chronic renal failure or end stage renal disease requiring dialysis Advanced or poorly controlled rheumatoid arthritis Diabetes mellitus and end stage chronic obstructive pulmonary disease Active cancer with current chemo- or radiation therapy DM and Dialysis Palliative Wound Management Criteria Care Approach Wound Care Plan: Complex Wound Management Electronic Signature(s) Signed: 09/07/2022 3:49:04 PM By: Gretta Cool, BSN, RN, CWS, Kim RN, BSN Signed: 09/11/2022 1:28:04 PM By: Worthy Keeler PA-C Entered By: Gretta Cool, BSN, RN, CWS, Kim on 09/07/2022 15:49:03 [name] ([id number]) [id number] Page 4 of 8 -------------------------------------------------------------------------------- Encounter Discharge Information Details Patient Name: Date of Service: [name], [name] 08/11/2022 3:00 PM Medical Record Number: [id number] Patient Account Number: [id number] Date of Birth/Sex: Treating RN: 11-Nov-1965 (56 y.o. Orvan Falconer Primary Care Marqual Mi: SYSTEM, PCP Other Clinician: Massie Kluver Referring Yash Cacciola: Treating Kyisha Fowle/Extender: Vladimir Faster in Treatment: 17 Encounter Discharge Information Items Discharge Condition: Stable Ambulatory Status: Crutches Discharge Destination: Home Transportation: Private Auto Accompanied By: self Schedule Follow-up Appointment: Yes Clinical Summary of Care: Electronic Signature(s) Signed: 08/18/2022 4:45:28 PM By: Massie Kluver Entered By: Massie Kluver on 08/11/2022  16:03:42 -------------------------------------------------------------------------------- Lower Extremity Assessment Details Patient Name: Date of Service: [name], [name] 08/11/2022 3:00 PM Medical Record Number: [id number] Patient Account Number: [id number] Date of Birth/Sex: Treating RN: 04-10-1966 (56 y.o. Orvan Falconer Primary Care Penne Rosenstock: SYSTEM, PCP Other Clinician: Massie Kluver Referring Darry Kelnhofer: Treating Xena Propst/Extender: Vladimir Faster in Treatment: 17 Electronic Signature(s) Signed: 08/11/2022 4:34:43 PM By: Carlene Coria RN Signed: 08/18/2022 4:45:28 PM By: Massie Kluver Entered By: Massie Kluver on 08/11/2022 15:43:09 -------------------------------------------------------------------------------- Multi Wound Chart Details Patient Name: Date of Service: [name],  Gulianna 08/11/2022 3:00 PM Medical Record Number: [id number] Patient Account Number: [id number] Date of Birth/Sex: Treating RN: 1966-04-07 (56 y.o. Orvan Falconer Primary Care Rasheida [name]: SYSTEM, PCP Other Clinician: Massie Kluver Referring Lopez Dentinger: Treating Woodie Trusty/Extender: Vladimir Faster in Treatment: [place], [employer] ([id number]) [id number] Page 5 of 8 Height(in): 67 Pulse(bpm): 96 Weight(lbs): 164 Blood Pressure(mmHg): 172/96 Body Mass Index(BMI): 25.7 Temperature(F): 98.2 Respiratory Rate(breaths/min): 16 [1:Photos:] [N/A:N/A] Left, Posterior Lower Leg N/A N/A Wound Location: Gradually Appeared N/A N/A Wounding Event: Diabetic Wound/Ulcer of the Lower N/A N/A Primary Etiology: Extremity Atypical N/A N/A Secondary Etiology: Anemia, Asthma, Type II Diabetes, N/A N/A Comorbid History: Neuropathy 02/23/2022 N/A N/A Date Acquired: 29 N/A N/A Weeks of Treatment: Open N/A N/A Wound Status: No N/A N/A Wound Recurrence: 0.5x0.5x0.1 N/A N/A Measurements L x W x D (cm) 0.196 N/A N/A A (cm) : rea 0.02 N/A  N/A Volume (cm) : 82.70% N/A N/A % Reduction in A rea: 91.20% N/A N/A % Reduction in Volume: Grade 1 N/A N/A Classification: Medium N/A N/A Exudate A mount: Serosanguineous N/A N/A Exudate Type: red, brown N/A N/A Exudate Color: Small (1-33%) N/A N/A Epithelialization: Treatment Notes Electronic Signature(s) Signed: 08/18/2022 4:45:28 PM By: Massie Kluver Entered By: Massie Kluver on 08/11/2022 15:43:13 -------------------------------------------------------------------------------- Multi-Disciplinary Care Plan Details Patient Name: Date of Service: [name], Braelynn 08/11/2022 3:00 PM Medical Record Number: [id number] Patient Account Number: [id number] Date of Birth/Sex: Treating RN: 05-21-1966 (56 y.o. Orvan Falconer Primary Care Danella Philson: SYSTEM, PCP Other Clinician: Massie Kluver Referring Sidney Kann: Treating Veronnica Hennings/Extender: Vladimir Faster in Treatment: 17 Active Inactive Wound/Skin Impairment Nursing Diagnoses: Impaired tissue integrity [name], [name] ([id number]) [id number] Page 6 of 8 Knowledge deficit related to smoking impact on wound healing Knowledge deficit related to ulceration/compromised skin integrity Goals: Patient/caregiver will verbalize understanding of skin care regimen Date Initiated: 04/14/2022 Target Resolution Date: 08/14/2022 Goal Status: Active Ulcer/skin breakdown will have a volume reduction of 30% by week 4 Date Initiated: 04/14/2022 Date Inactivated: 07/13/2022 Target Resolution Date: 05/05/2022 Goal Status: Unmet Unmet Reason: comoridities Interventions: Assess patient/caregiver ability to obtain necessary supplies Assess patient/caregiver ability to perform ulcer/skin care regimen upon admission and as needed Assess ulceration(s) every visit Treatment Activities: Referred to DME Irineo Gaulin for dressing supplies : 04/14/2022 Skin care regimen initiated : 04/14/2022 Topical wound management  initiated : 04/14/2022 Notes: Electronic Signature(s) Signed: 08/11/2022 4:34:43 PM By: Carlene Coria RN Signed: 08/18/2022 4:45:28 PM By: Massie Kluver Entered By: Massie Kluver on 08/11/2022 16:02:58 -------------------------------------------------------------------------------- Pain Assessment Details Patient Name: Date of Service: [name], [place] 08/11/2022 3:00 PM Medical Record Number: [id number] Patient Account Number: [id number] Date of Birth/Sex: Treating RN: 03-27-1966 (56 y.o. Orvan Falconer Primary Care Mayar Whittier: SYSTEM, PCP Other Clinician: Massie Kluver Referring Doneta Bayman: Treating Jonatan Wilsey/Extender: Vladimir Faster in Treatment: 17 Active Problems Location of Pain Severity and Description of Pain Patient Has Paino No Site Locations Pain Management and Medication Current Pain Management: NAQUITA, NAPPIER ([id number]) [id number] Page 7 of 8 Electronic Signature(s) Signed: 08/11/2022 4:34:43 PM By: Carlene Coria RN Signed: 08/18/2022 4:45:28 PM By: Massie Kluver Entered By: Massie Kluver on 08/11/2022 15:39:44 -------------------------------------------------------------------------------- Patient/Caregiver Education Details Patient Name: Date of Service: [name], Anjanette 12/12/2023andnbsp3:00 PM Medical Record Number: [id number] Patient Account Number: [id number] Date of Birth/Gender: Treating RN: [birth year] (56 y.o. Orvan Falconer Primary Care Physician: SYSTEM, PCP Other Clinician: Massie Kluver Referring Physician: Treating Physician/Extender: Vladimir Faster in Treatment: 17 Education Assessment Education Provided To: Patient Education Topics  Provided Wound/Skin Impairment: Handouts: Other: continue wound care as directed Methods: Explain/Verbal Responses: State content correctly Electronic Signature(s) Signed: 08/18/2022 4:45:28 PM By: Massie Kluver Entered By: Massie Kluver on  08/11/2022 16:02:33 -------------------------------------------------------------------------------- Wound Assessment Details Patient Name: Date of Service: [name], BRODMAN 08/11/2022 3:00 PM Medical Record Number: [id number] Patient Account Number: [id number] Date of Birth/Sex: Treating RN: 17-Jul-1966 (56 y.o. Orvan Falconer Primary Care Aniel Hubble: SYSTEM, PCP Other Clinician: Massie Kluver Referring Chrisoula Zegarra: Treating Mersades Barbaro/Extender: Kathrynn Running Weeks in Treatment: 17 Wound Status Wound Number: 1 Primary Etiology: Diabetic Wound/Ulcer of the Lower Extremity Wound Location: Left, Posterior Lower Leg Secondary Etiology: Atypical Wounding Event: Gradually Appeared Wound Status: Open Joycie, Aerts Juliette ([id number]) [id number] Page 8 of 8 Date Acquired: 02/23/2022 Comorbid History: Anemia, Asthma, Type II Diabetes, Neuropathy Weeks Of Treatment: 17 Clustered Wound: No Photos Wound Measurements Length: (cm) 0.5 Width: (cm) 0.5 Depth: (cm) 0.1 Area: (cm) 0.196 Volume: (cm) 0.02 % Reduction in Area: 82.7% % Reduction in Volume: 91.2% Epithelialization: Small (1-33%) Tunneling: No Undermining: No Wound Description Classification: Grade 1 Exudate Amount: Medium Exudate Type: Serosanguineous Exudate Color: red, brown Foul Odor After Cleansing: No Slough/Fibrino No Electronic Signature(s) Signed: 08/11/2022 4:34:43 PM By: Carlene Coria RN Signed: 08/18/2022 4:45:28 PM By: Massie Kluver Entered By: Massie Kluver on 08/11/2022 15:42:45 -------------------------------------------------------------------------------- Vitals Details Patient Name: Date of Service: [name], Sheily 08/11/2022 3:00 PM Medical Record Number: [id number] Patient Account Number: [id number] Date of Birth/Sex: Treating RN: 1966/07/02 (56 y.o. Orvan Falconer Primary Care Dajsha Massaro: SYSTEM, PCP Other Clinician: Massie Kluver Referring Rola Lennon: Treating  Emylie Amster/Extender: Vladimir Faster in Treatment: 17 Vital Signs Time Taken: 15:35 Temperature (F): 98.2 Height (in): 67 Pulse (bpm): 96 Weight (lbs): 164 Respiratory Rate (breaths/min): 16 Body Mass Index (BMI): 25.7 Blood Pressure (mmHg): 172/96 Reference Range: 80 - 120 mg / dl Electronic Signature(s) Signed: 08/18/2022 4:45:28 PM By: Massie Kluver Entered By: Massie Kluver on 08/11/2022 15:39:36

## 2022-08-25 ENCOUNTER — Encounter: Payer: BC Managed Care – PPO | Admitting: Internal Medicine

## 2022-09-10 ENCOUNTER — Encounter: Payer: Medicare Other | Attending: Physician Assistant | Admitting: Physician Assistant

## 2022-09-10 DIAGNOSIS — L97828 Non-pressure chronic ulcer of other part of left lower leg with other specified severity: Secondary | ICD-10-CM | POA: Diagnosis not present

## 2022-09-10 DIAGNOSIS — E1122 Type 2 diabetes mellitus with diabetic chronic kidney disease: Secondary | ICD-10-CM | POA: Diagnosis not present

## 2022-09-10 DIAGNOSIS — E11622 Type 2 diabetes mellitus with other skin ulcer: Secondary | ICD-10-CM | POA: Insufficient documentation

## 2022-09-10 DIAGNOSIS — N186 End stage renal disease: Secondary | ICD-10-CM | POA: Insufficient documentation

## 2022-09-10 DIAGNOSIS — I120 Hypertensive chronic kidney disease with stage 5 chronic kidney disease or end stage renal disease: Secondary | ICD-10-CM | POA: Diagnosis not present

## 2022-09-10 DIAGNOSIS — J45909 Unspecified asthma, uncomplicated: Secondary | ICD-10-CM | POA: Insufficient documentation

## 2022-09-10 DIAGNOSIS — Z992 Dependence on renal dialysis: Secondary | ICD-10-CM | POA: Insufficient documentation

## 2022-09-10 DIAGNOSIS — E1151 Type 2 diabetes mellitus with diabetic peripheral angiopathy without gangrene: Secondary | ICD-10-CM | POA: Insufficient documentation

## 2022-09-10 DIAGNOSIS — E1140 Type 2 diabetes mellitus with diabetic neuropathy, unspecified: Secondary | ICD-10-CM | POA: Diagnosis not present

## 2022-09-10 NOTE — Progress Notes (Addendum)
KAI, RAILSBACK ([id number]) [id number] Page 1 of 6 Visit Report for 09/10/2022 Chief Complaint Document Details Patient Name: Date of Service: [name], [name] 09/10/2022 3:30 PM Medical Record Number: [id number] Patient Account Number: [id number] Date of Birth/Sex: Treating RN: [birth year] (57 y.o. Marlowe Shores Primary Care Provider: SYSTEM, PCP Other Clinician: Massie Kluver Referring Provider: Treating Provider/Extender: Vladimir Faster in Treatment: 21 Information Obtained from: Patient Chief Complaint Ulcer left leg posteriorly Electronic Signature(s) Signed: 09/10/2022 3:43:21 PM By: Worthy Keeler PA-C Entered By: Worthy Keeler on 09/10/2022 15:43:21 -------------------------------------------------------------------------------- HPI Details Patient Name: Date of Service: [name], [place] 09/10/2022 3:30 PM Medical Record Number: [id number] Patient Account Number: [id number] Date of Birth/Sex: Treating RN: [birth year] (57 y.o. Marlowe Shores Primary Care Provider: SYSTEM, PCP Other Clinician: Massie Kluver Referring Provider: Treating Provider/Extender: Vladimir Faster in Treatment: 21 History of Present Illness HPI Description: 04-14-2022 upon evaluation today patient presents for initial inspection here in the clinic concerning issues that she has been having with the wound over the left posterior lower extremity. This started out as a painful red burning area which she tells me has developed into more of an eschar covered region. 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 With that being said I discussed with her today what exactly that is and what it means. Also discussed that she is going to likely need to be closely monitored to ensure this does not get worse. I did perform a debridement today but I tried to be as careful as possible with this debridement in order to not cause anything to  worsen nonetheless we needed to get some of the eschar off. 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 She also has been on doxycycline which was started on 03-27-2022 which has not really made a significant improvement. She is also been on mupirocin topically which is not helping. She has been using some essential oil which she feels like made it feel little bit better but still this does not seem to be getting better. Overall she has become very frustrated with the situation. 04-21-2022 upon evaluation today patient appears to be doing well currently with regard to her wound. She has been tolerating the dressing changes using just the mupirocin we have been waiting on the Santyl. I tried multiple places to [facility] as well ended up having a going through but at the same time she is not able to actually get that currently due to the fact that she has been told that I did not "write the prescription correctly and it will not go through her insurance that way". With that being said I am not exactly sure what could be wrong about it is actually a macro that I used to put the prescription in the dosing is appropriate and I even put the measurements on the prescription for the wound size which is needed as well. With that being said I am going to call the pharmacy today while the patient is here in the clinic to verify what is going on and make sure we get this taken care of. BRIGITTE, SODERBERG ([id number]) [id number] Page 2 of 6 05-11-2022 upon evaluation today patient appears to be doing well with regard to her wound. She is showing signs of improvement a lot of the necrotic tissue is loosening up and she  is showing more think good granulation tissue starting to poke through. With that being said I do not see any evidence of active infection locally or  systemically at this time. 05-25-2022 upon evaluation today patient appears to be doing well currently in regard to her wound. We are actually making signs of good progress. Fortunately I do not see any evidence of active infection which is great. She does have more granulation tissue starting to show itself and I am happy in that regard. I do believe the Santyl however still probably her best bet based on what we are seeing. 06-08-2022 upon evaluation today patient appears to be doing better in regard to her wound she is actually showing signs of good improvement which is great news and overall I am extremely pleased with where things stand today. There does not appear to be any evidence of active infection locally or systemically at this time which is great news. No fevers, chills, nausea, vomiting, or diarrhea. 06-29-2022 upon evaluation today patient appears to be doing well currently in regard to her wound. This actually has some granulation budding but also is very slow to heal. She has been using the collagen although she did run out and went back to the [facility] after she ran out of the [place]. Nonetheless she tells me it was burning some although she was using some colloidal silver. That was being used instead of the saline and she is not sure if that coupled with the silver in the Prisma was too much or what. 07-13-2022 upon evaluation today patient appears to be doing well currently in regard to her leg ulcer this is measuring smaller and looking much better. Fortunately I do not see any evidence of infection locally or systemically at this time which is great news. No fevers, chills, nausea, vomiting, or diarrhea. 07-28-2022 upon evaluation today patient appears to be doing well currently in regard to her wound. She has been tolerating the dressing changes without complication. This is actually measuring much smaller looking much better and overall I think we are getting very close to complete  resolution. 08-11-2022 upon inspection patient's wound bed is actually showing signs of excellent improvement and overall I am extremely pleased with where we stand currently. Fortunately there does not appear to be any signs of infection locally nor systemically which is great news. 09-10-2022 upon evaluation today patient appears to be doing well currently in regard to her wound which is showing signs of being completely healed. Fortunately there does not appear to be any signs of infection locally or systemically which is great news I am extremely pleased with where we stand today. Electronic Signature(s) Signed: 09/10/2022 4:37:36 PM By: Worthy Keeler PA-C Entered By: Worthy Keeler on 09/10/2022 16:37:36 -------------------------------------------------------------------------------- Physical Exam Details Patient Name: Date of Service: BARRY, [place] 09/10/2022 3:30 PM Medical Record Number: [id number] Patient Account Number: [id number] Date of Birth/Sex: Treating RN: Mar 22, 1966 (57 y.o. Marlowe Sh[place] Primary Care Provider: SYSTEM, PCP Other Clinician: Massie Kluver Referring Provider: Treating Provider/Extender: Vladimir Faster in Treatment: 21 Constitutional Obese and well-hydrated in no acute distress. Respiratory normal breathing without difficulty. Psychiatric this patient is able to make decisions and demonstrates good insight into disease process. Alert and Oriented x 3. pleasant and cooperative. Notes Patient's wound bed actually showed signs of excellent epithelization. There is no evidence of anything open I think she is at the point where using AandD ointment would be ideal as far as keeping this  area protected and moisturize. She is in agreement with that plan. Electronic Signature(s) Signed: 09/10/2022 4:37:59 PM By: Worthy Keeler PA-C Entered By: Worthy Keeler on 09/10/2022 16:37:59 Michail Sermon, Alexandre ([id number])  [id number] Page 3 of 6 -------------------------------------------------------------------------------- Physician Orders Details Patient Name: Date of Service: LILLYEN, SCHOW 09/10/2022 3:30 PM Medical Record Number: [id number] Patient Account Number: [id number] Date of Birth/Sex: Treating RN: 1965/11/27 (57 y.o. 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 your wound has healed Moisturize legs daily after removing compression garments. - apply AandD ointment to area for the next 2-3 weeks for skin protection Elevate, Exercise Daily and A void Standing for Long Periods of Time. Electronic Signature(s) Signed: 09/10/2022 5:05:26 PM By: Worthy Keeler PA-C Signed: 09/11/2022 1:23:45 PM By: Massie Kluver Entered By: Massie Kluver on 09/10/2022 16:36:40 -------------------------------------------------------------------------------- Problem List Details Patient Name: Date of Service: [name], [place] 09/10/2022 3:30 PM Medical Record Number: [id number] Patient Account Number: [id number] Date of Birth/Sex: Treating RN: 02/26/1966 (57 y.o. Marlowe [place] Primary Care Provider: SYSTEM, PCP Other Clinician: Massie Kluver Referring Provider: Treating Provider/Extender: Vladimir Faster in Treatment: 21 Active Problems ICD-10 Encounter Code Description Active Date MDM Diagnosis E11.622  Type 2 diabetes mellitus with other skin ulcer 04/14/2022 No Yes Suber, Chandy ([id number]) [id number] Page 4 of 6 [phone] Non-pressure chronic ulcer of other part of left lower leg with other specified 04/14/2022 No Yes severity I10 Essential (primary) hypertension 04/14/2022 No Yes N18.6 End stage renal disease 04/14/2022 No Yes Z99.2 Dependence on renal dialysis 04/14/2022 No Yes Inactive Problems Resolved Problems Electronic Signature(s) Signed: 09/10/2022 3:43:18 PM By: Worthy Keeler PA-C Entered By: Worthy Keeler on 09/10/2022 15:43:17 -------------------------------------------------------------------------------- Progress Note Details Patient Name: Date of Service: [name], [place] 09/10/2022 3:30 PM Medical Record Number: [id number] Patient Account Number: [id number] Date of Birth/Sex: Treating RN: [birth year] (57 y.o. [place]e [place] Primary Care Provider: SYSTEM, PCP Other Clinician: Massie Kluver Referring Provider: Treating Provider/Extender: Vladimir Faster in Treatment: 21 Subjective Chief Complaint Information obtained from Patient Ulcer left leg posteriorly History of Present Illness (HPI) 04-14-2022 upon evaluation today patient presents for initial inspection here in the clinic concerning issues that she has been having with the wound over the left posterior lower extremity. This started out as a painful red burning area which she tells me has developed into more of an eschar covered region. 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 With that being said I discussed with her today what exactly that is and what it means. Also discussed that she is going to likely need to be closely monitored to ensure this does not get worse. I did perform a debridement today but I tried to be as careful as possible with this debridement in order to not cause anything to worsen nonetheless we needed to get some of the eschar  off. The patient does have a history again of having end-stage renal disease for which she is on dialysis, she has broken both hips and had bilateral hip  replacements. She also has been on doxycycline which was started on 03-27-2022 which has not really made a significant improvement. She is also been on mupirocin topically which is not helping. She has been using some essential oil which she feels like made it feel little bit better but still this does not seem to be getting better. Overall she has become very frustrated with the situation. 04-21-2022 upon evaluation today patient appears to be doing well currently with regard to her wound. She has been tolerating the dressing changes using just the mupirocin we have been waiting on the Santyl. I [facility] ended up having a going through but at the same time she is not able to actually get that currently due to the fact that she has been told that I did not "write the prescription correctly and it will not go through her insurance that way". With that being said I am not exactly sure what could be wrong about it is actually a macro that I used to put the prescription in the dosing is appropriate and I even put the measurements on the prescription for the wound size which is needed as well. With that being said I am going to call the pharmacy today while the patient is here in the clinic to verify what is going on and make sure we get this taken care of. 05-11-2022 upon evaluation today patient appears to be doing well with regard to her wound. She is showing signs of improvement a lot of the necrotic tissue is loosening up and she is showing more think good granulation tissue starting to poke through. With that being said I do not see any evidence of active infection locally or systemically at this time. SAIDAH, KEMPTON ([id number]) [id number] Page 5 of 6 05-25-2022 upon evaluation today  patient appears to be doing well currently in regard to her wound. We are actually making signs of good progress. Fortunately I do not see any evidence of active infection which is great. She does have more granulation tissue starting to show itself and I am happy in that regard. I do believe the Santyl however still probably her best bet based on what we are seeing. 06-08-2022 upon evaluation today patient appears to be doing better in regard to her wound she is actually showing signs of good improvement which is great news and overall I am extremely pleased with where things stand today. There does not appear to be any evidence of active infection locally or systemically at this time which is great news. No fevers, chills, nausea, vomiting, or diarrhea. 06-29-2022 upon evaluation today patient appears to be doing well currently in regard to her wound. This actually has some granulation budding but also is very slow to heal. She has been using the collagen although she did run out and went back to the [place] after she ran out of the [place]. Nonetheless she tells me it was burning some although she was using some colloidal silver. That was being used instead of the saline and she is not sure if that coupled with the silver in the [place] was too much or what. 07-13-2022 upon evaluation today patient appears to be doing well currently in regard to her leg ulcer this is measuring smaller and looking much better. Fortunately I do not see any evidence of infection locally or systemically at this time which is great news. No fevers, chills, nausea, vomiting, or diarrhea. 07-28-2022 upon evaluation  today patient appears to be doing well currently in regard to her wound. She has been tolerating the dressing changes without complication. This is actually measuring much smaller looking much better and overall I think we are getting very close to complete resolution. 08-11-2022 upon inspection patient's wound bed  is actually showing signs of excellent improvement and overall I am extremely pleased with where we stand currently. Fortunately there does not appear to be any signs of infection locally nor systemically which is great news. 09-10-2022 upon evaluation today patient appears to be doing well currently in regard to her wound which is showing signs of being completely healed. Fortunately there does not appear to be any signs of infection locally or systemically which is great news I am extremely pleased with where we stand today. Objective Constitutional Obese and well-hydrated in no acute distress. Vitals Time Taken: 4:10 PM, Height: 67 in, Weight: 164 lbs, BMI: 25.7, Temperature: 97.9 F, Pulse: 94 bpm, Respiratory Rate: 18 breaths/min, Blood Pressure: 177/94 mmHg. Respiratory normal breathing without difficulty. Psychiatric this patient is able to make decisions and demonstrates good insight into disease process. Alert and Oriented x 3. pleasant and cooperative. General Notes: Patient's wound bed actually showed signs of excellent epithelization. There is no evidence of anything open I think she is at the point where using AandD ointment would be ideal as far as keeping this area protected and moisturize. She is in agreement with that plan. Integumentary (Hair, Skin) Wound #1 status is Healed - Epithelialized. Original cause of wound was Gradually Appeared. The date acquired was: 02/23/2022. The wound has been in treatment 21 weeks. The wound is located on the Left,Posterior Lower Leg. The wound measures 0cm length x 0cm width x 0cm depth; 0cm^2 area and 0cm^3 volume. There is a none present amount of drainage noted. 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 your wound has healed Moisturize legs daily after removing compression garments. - apply AandD ointment to area for the next 2-3 weeks for skin protection Elevate, Exercise Daily and Avoid Standing for Long Periods of Time. 1. I am going to recommend that we have the patient go ahead and discontinue wound care services at this point as she does appear to be completely healed and this is also news. IDAMAY, HOSEIN ([id number]) [id number] Page 6 of 6 2. I am also can recommend that we continue to monitor for any signs of infection or worsening office if anything changes she knows contact the office and let me know. Follow-up as needed. Electronic Signature(s) Signed: 09/10/2022 4:38:43 PM By: Worthy Keeler PA-C Entered By: Worthy Keeler on 09/10/2022 16:38:43 -------------------------------------------------------------------------------- SuperBill Details Patient Name: Date of Service: [name], [place] 09/10/2022 Medical Record Number: [id number] Patient Account Number: [id number] Date of Birth/Sex: Treating RN: Mar 19, 1966 (57 y.o. [place]e [place] Primary Care Provider: SYSTEM, PCP Other Clinician: Massie Kluver Referring Provider: Treating Provider/Extender: Vladimir Faster in Treatment: 21 Diagnosis Coding ICD-10 Codes Code Description E11.622 Type 2 diabetes mellitus with other skin ulcer L97.828 Non-pressure chronic ulcer of other part of left lower leg with other specified severity I10 Essential (primary) hypertension N18.6 End stage renal disease Z99.2 Dependence on renal dialysis Facility Procedures : CPT4 Code: [id number] Description: [phone] -  WOUND CARE VISIT-LEV 2 EST PT Modifier: Quantity: 1 Electronic Signature(s) Signed: 09/10/2022 5:05:26 PM By: Worthy Keeler PA-C Signed: 09/11/2022 1:23:45 PM By: Massie Kluver Previous Signature:  09/10/2022 4:38:58 PM Version By: Worthy Keeler PA-C Entered By: Massie Kluver on 09/10/2022 16:51:05

## 2022-09-10 NOTE — Progress Notes (Addendum)
JOPLIN, CANTY ([id number]) [id number] Page 1 of 8 Visit Report for 09/10/2022 Arrival Information Details Patient Name: Date of Service: [name], [name] 09/10/2022 3:30 PM Medical Record Number: [id number] Patient Account Number: [id number] Date of Birth/Sex: Treating RN: 12-23-1965 (57 y.o. [facility] Primary Care Gordon Carlson: SYSTEM, PCP Other Clinician: Massie Kluver Referring Josimar Corning: Treating Nyshawn Gowdy/Extender: Vladimir Faster in Treatment: 21 Visit Information History Since Last Visit All ordered tests and consults were completed: No Patient Arrived: Crutches Added or deleted any medications: No Arrival Time: 16:06 Any new allergies or adverse reactions: No Transfer Assistance: None Had a fall or experienced change in No Patient Identification Verified: Yes activities of daily living that may affect Secondary Verification Process Completed: Yes risk of falls: Patient Requires Transmission-Based Precautions: No Signs or symptoms of abuse/neglect since last visito No Patient Has Alerts: Yes Hospitalized since last visit: No Patient Alerts: Diabetic Type II Implantable device outside of the clinic excluding No cellular tissue based products placed in the center since last visit: Has Dressing in Place as Prescribed: Yes Pain Present Now: No Electronic Signature(s) Signed: 09/11/2022 1:23:45 PM By: Massie Kluver Entered By: Massie Kluver on 09/10/2022 16:09:42 -------------------------------------------------------------------------------- Clinic Level of Care Assessment Details Patient Name: Date of Service: Elizabeth, [name] 09/10/2022 3:30 PM Medical Record Number: [id number] Patient Account Number: [id number] Date of Birth/Sex: Treating RN: Jul 05, 1966 (57 y.o. [facility] Primary Care Kazim Corrales: SYSTEM, PCP Other Clinician: Massie Kluver Referring Majour Frei: Treating Thomos Domine/Extender: Vladimir Faster in  Treatment: 21 Clinic Level of Care Assessment Items TOOL 4 Quantity Score []  - 0 Use when only an EandM is performed on FOLLOW-UP visit ASSESSMENTS - Nursing Assessment / Reassessment X- 1 10 Reassessment of Co-morbidities (includes updates in patient status) X- 1 5 Reassessment of Adherence to Treatment Plan REIGER, [place] ([id number]) [phone].pdf Page 2 of 8 ASSESSMENTS - Wound and Skin A ssessment / Reassessment X - Simple Wound Assessment / Reassessment - one wound 1 5 []  - 0 Complex Wound Assessment / Reassessment - multiple wounds []  - 0 Dermatologic / Skin Assessment (not related to wound area) ASSESSMENTS - Focused Assessment []  - 0 Circumferential Edema Measurements - multi extremities []  - 0 Nutritional Assessment / Counseling / Intervention []  - 0 Lower Extremity Assessment (monofilament, tuning fork, pulses) []  - 0 Peripheral Arterial Disease Assessment (using hand held doppler) ASSESSMENTS - Ostomy and/or Continence Assessment and Care []  - 0 Incontinence Assessment and Management []  - 0 Ostomy Care Assessment and Management (repouching, etc.) PROCESS - Coordination of Care X - Simple Patient / Family Education for ongoing care 1 15 []  - 0 Complex (extensive) Patient / Family Education for ongoing care []  - 0 Staff obtains Programmer, systems, Records, T Results / Process Orders est []  - 0 Staff telephones HHA, Nursing Homes / Clarify orders / etc []  - 0 Routine Transfer to another Facility (non-emergent condition) []  - 0 Routine Hospital Admission (non-emergent condition) []  - 0 New Admissions / Biomedical engineer / Ordering NPWT Apligraf, etc. , []  - 0 Emergency Hospital Admission (emergent condition) X- 1 10 Simple Discharge Coordination []  - 0 Complex (extensive) Discharge Coordination PROCESS - Special Needs []  - 0 Pediatric / Minor Patient Management []  - 0 Isolation Patient Management []  - 0 Hearing / Language / Visual  special needs []  - 0 Assessment of Community assistance (transportation, D/C planning, etc.) []  - 0 Additional assistance / Altered mentation []  - 0 Support Surface(s) Assessment (bed, cushion, seat, etc.) INTERVENTIONS - Wound  Cleansing / Measurement X - Simple Wound Cleansing - one wound 1 5 []  - 0 Complex Wound Cleansing - multiple wounds X- 1 5 Wound Imaging (photographs - any number of wounds) []  - 0 Wound Tracing (instead of photographs) []  - 0 Simple Wound Measurement - one wound []  - 0 Complex Wound Measurement - multiple wounds INTERVENTIONS - Wound Dressings []  - 0 Small Wound Dressing one or multiple wounds []  - 0 Medium Wound Dressing one or multiple wounds []  - 0 Large Wound Dressing one or multiple wounds []  - 0 Application of Medications - topical []  - 0 Application of Medications - injection INTERVENTIONS - Miscellaneous []  - 0 External ear exam Heesch, Shuntell ([id number]) [id number] Page 3 of 8 []  - 0 Specimen Collection (cultures, biopsies, blood, body fluids, etc.) []  - 0 Specimen(s) / Culture(s) sent or taken to Lab for analysis []  - 0 Patient Transfer (multiple staff / Harrel Lemon Lift / Similar devices) []  - 0 Simple Staple / Suture removal (25 or less) []  - 0 Complex Staple / Suture removal (26 or more) []  - 0 Hypo / Hyperglycemic Management (close monitor of Blood Glucose) []  - 0 Ankle / Brachial Index (ABI) - do not check if billed separately X- 1 5 Vital Signs Has the patient been seen at the hospital within the last three years: Yes Total Score: 60 Level Of Care: New/Established - Level 2 Electronic Signature(s) Signed: 09/11/2022 1:23:45 PM By: Massie Kluver Entered By: Massie Kluver on 09/10/2022 16:37:51 -------------------------------------------------------------------------------- Encounter Discharge Information Details Patient Name: Date of Service: [name], [name] 09/10/2022 3:30 PM Medical Record Number:  [id number] Patient Account Number: [id number] Date of Birth/Sex: Treating RN: 20-Jan-1966 (57 y.o. [place] Primary Care Anneliese Leblond: SYSTEM, PCP Other Clinician: Massie Kluver Referring Ellaree Gear: Treating Lourene Hoston/Extender: Vladimir Faster in Treatment: 21 Encounter Discharge Information Items Discharge Condition: Stable Ambulatory Status: Crutches Discharge Destination: Home Transportation: Private Auto Accompanied By: self Schedule Follow-up Appointment: No Clinical Summary of Care: Electronic Signature(s) Signed: 09/11/2022 1:23:45 PM By: Massie Kluver Entered By: Massie Kluver on 09/10/2022 16:56:00 Lower Extremity Assessment Details -------------------------------------------------------------------------------- [name] ([id number]) [id number] Page 4 of 8 Patient Name: Date of Service: CORTASIA, SCREWS 09/10/2022 3:30 PM Medical Record Number: [id number] Patient Account Number: [id number] Date of Birth/Sex: Treating RN: [birth year] (57 y.o. [place] Primary Care Izabellah Dadisman: SYSTEM, PCP Other Clinician: Massie Kluver Referring Anmol Paschen: Treating Jalacia Mattila/Extender: Vladimir Faster in Treatment: 21 Electronic Signature(s) Signed: 09/10/2022 5:17:24 PM By: Gretta Cool BSN, RN, CWS, Kim RN, BSN Signed: 09/11/2022 1:23:45 PM By: Massie Kluver Entered By: Massie Kluver on 09/10/2022 16:20:27 -------------------------------------------------------------------------------- Multi Wound Chart Details Patient Name: Date of Service: [name], [place] 09/10/2022 3:30 PM Medical Record Number: [id number] Patient Account Number: [id number] Date of Birth/Sex: Treating RN: [birth year] (57 y.o. [place] Primary Care Pragya Lofaso: SYSTEM, PCP Other Clinician: Massie Kluver Referring Zenna Traister: Treating Leyla Soliz/Extender: Vladimir Faster in Treatment: 21 Vital Signs Height(in): 67 Pulse(bpm):  94 Weight(lbs): 164 Blood Pressure(mmHg): 177/94 Body Mass Index(BMI): 25.7 Temperature(F): 97.9 Respiratory Rate(breaths/min): 18 [1:Photos:] [N/A:N/A] Left, Posterior Lower Leg N/A N/A Wound Location: Gradually Appeared N/A N/A Wounding Event: Diabetic Wound/Ulcer of the Lower N/A N/A Primary Etiology: Extremity Atypical N/A N/A Secondary Etiology: Anemia, Asthma, Type II Diabetes, N/A N/A Comorbid History: Neuropathy 02/23/2022 N/A N/A Date Acquired: 21 N/A N/A Weeks of Treatment: Open N/A N/A Wound Status: No N/A N/A Wound Recurrence: 0.1x0.1x0.1 N/A N/A Measurements L x W x D (cm) 0.008 N/A  N/A A (cm) : rea 0.001 N/A N/A Volume (cm) : 99.30% N/A N/A % Reduction in A rea: 99.60% N/A N/A % Reduction in Volume: Grade 1 N/A N/A Classification: Medium N/A N/A Exudate A mount: Serosanguineous N/A N/A Exudate Type: red, brown N/A N/A Exudate Color: Small (1-33%) N/A N/A Epithelialization: Treatment Notes Huckins, Aneira ([id number]) [id number] Page 5 of 8 Electronic Signature(s) Signed: 09/11/2022 1:23:45 PM By: Massie Kluver Entered By: Massie Kluver on 09/10/2022 16:20:52 -------------------------------------------------------------------------------- Multi-Disciplinary Care Plan Details Patient Name: Date of Service: SECILIA, APPS 09/10/2022 3:30 PM Medical Record Number: [id number] Patient Account Number: [id number] Date of Birth/Sex: Treating RN: Jan 25, 1966 (57 y.o. [place] Primary Care Lyndy Russman: SYSTEM, PCP Other Clinician: Massie Kluver Referring Kinzee Happel: Treating Becci Batty/Extender: Vladimir Faster in Treatment: 21 Active Inactive Electronic Signature(s) Signed: 09/10/2022 5:17:24 PM By: Gretta Cool BSN, RN, CWS, Kim RN, BSN Signed: 09/11/2022 1:23:45 PM By: Massie Kluver Entered By: Massie Kluver on 09/10/2022  16:54:21 -------------------------------------------------------------------------------- Pain Assessment Details Patient Name: Date of Service: TREANNA, DUMLER 09/10/2022 3:30 PM Medical Record Number: [id number] Patient Account Number: [id number] Date of Birth/Sex: Treating RN: 1965-11-25 (57 y.o. [place] Primary Care Dail Meece: SYSTEM, PCP Other Clinician: Massie Kluver Referring Evelynne Spiers: Treating Abir Craine/Extender: Vladimir Faster in Treatment: 21 Active Problems Location of Pain Severity and Description of Pain Patient Has Paino No Site Locations [place], [place] ([id number]) [id number] Page 6 of 8 Pain Management and Medication Current Pain Management: Electronic Signature(s) Signed: 09/10/2022 5:17:24 PM By: Gretta Cool, BSN, RN, CWS, Kim RN, BSN Signed: 09/11/2022 1:23:45 PM By: Massie Kluver Entered By: Massie Kluver on 09/10/2022 16:13:04 -------------------------------------------------------------------------------- Patient/Caregiver Education Details Patient Name: Date of Service: Lucinda Dell 1/11/2024andnbsp3:30 PM Medical Record Number: [id number] Patient Account Number: [id number] Date of Birth/Gender: Treating RN: 04-03-1966 (57 y.o. [place] Primary Care Physician: SYSTEM, PCP Other Clinician: Massie Kluver Referring Physician: Treating Physician/Extender: Vladimir Faster in Treatment: 21 Education Assessment Education Provided To: Patient Education Topics Provided Wound/Skin Impairment: Handouts: Other: your wound has healed. Please call if any further issues arise Methods: Explain/Verbal Responses: State content correctly Electronic Signature(s) Signed: 09/11/2022 1:23:45 PM By: Massie Kluver Entered By: Massie Kluver on 09/10/2022 16:54:08 Mccauley, [place] ([id number]) [id number] Page 7 of  8 -------------------------------------------------------------------------------- Wound Assessment Details Patient Name: Date of Service: GERALDINA, PARROTT 09/10/2022 3:30 PM Medical Record Number: [id number] Patient Account Number: [id number] Date of Birth/Sex: Treating RN: 1966/08/29 (57 y.o. [place] Primary Care Macel Yearsley: SYSTEM, PCP Other Clinician: Massie Kluver Referring Evander Macaraeg: Treating Cyani Kallstrom/Extender: Vladimir Faster in Treatment: 21 Wound Status Wound Number: 1 Primary Etiology: Diabetic Wound/Ulcer of the Lower Extremity Wound Location: Left, Posterior Lower Leg Secondary Etiology: Atypical Wounding Event: Gradually Appeared Wound Status: Healed - Epithelialized Date Acquired: 02/23/2022 Comorbid History: Anemia, Asthma, Type II Diabetes, Neuropathy Weeks Of Treatment: 21 Clustered Wound: No Photos Wound Measurements Length: (cm) 0 Width: (cm) 0 Depth: (cm) 0 Area: (cm) 0 Volume: (cm) 0 % Reduction in Area: 100% % Reduction in Volume: 100% Epithelialization: Large (67-100%) Wound Description Classification: Grade 1 Exudate Amount: None Present Foul Odor After Cleansing: No Slough/Fibrino No Treatment Notes Wound #1 (Lower Leg) Wound Laterality: Left, Posterior Cleanser Peri-Wound Care Topical Primary Dressing Secondary Dressing Secured With Compression Wrap Compression Stockings Add-Ons [place], Raneisha ([id number]) [id number] Page 8 of 8 Electronic Signature(s) Signed: 09/10/2022 5:17:24 PM By: Gretta Cool, BSN, RN, CWS, Kim RN, BSN Signed: 09/11/2022 1:23:45 PM By: Massie Kluver Entered By: Massie Kluver on 09/10/2022  16:33:40 -------------------------------------------------------------------------------- Vitals Details Patient Name: Date of Service: JESSIKAH, DICKER 09/10/2022 3:30 PM Medical Record Number: [id number] Patient Account Number: [id number] Date of Birth/Sex: Treating RN: June 14, 1966 (57 y.o.  [place] Primary Care Burnadette Baskett: SYSTEM, PCP Other Clinician: Massie Kluver Referring Avyukth Bontempo: Treating Karlos Scadden/Extender: Vladimir Faster in Treatment: 21 Vital Signs Time Taken: 16:10 Temperature (F): 97.9 Height (in): 67 Pulse (bpm): 94 Weight (lbs): 164 Respiratory Rate (breaths/min): 18 Body Mass Index (BMI): 25.7 Blood Pressure (mmHg): 177/94 Reference Range: 80 - 120 mg / dl Electronic Signature(s) Signed: 09/11/2022 1:23:45 PM By: Massie Kluver Entered By: Massie Kluver on 09/10/2022 16:13:00

## 2022-11-24 NOTE — Telephone Encounter (Signed)
Not performed

## 2023-12-22 ENCOUNTER — Other Ambulatory Visit
Admission: RE | Admit: 2023-12-22 | Discharge: 2023-12-22 | Disposition: A | Discharge status: Home / Self Care | Payer: PRIVATE HEALTH INSURANCE | Source: Ambulatory Visit | Attending: Nephrology | Admitting: Nephrology

## 2023-12-22 DIAGNOSIS — N186 End stage renal disease: Secondary | ICD-10-CM | POA: Insufficient documentation

## 2023-12-22 LAB — POTASSIUM: Potassium: 5.1 mmol/L (ref 3.5–5.1)

## 2023-12-23 LAB — HEPATITIS B SURFACE ANTIGEN: Hepatitis B Surface Ag: NONREACTIVE

## 2024-02-20 ENCOUNTER — Other Ambulatory Visit: Payer: Self-pay

## 2024-02-20 ENCOUNTER — Emergency Department

## 2024-02-20 ENCOUNTER — Emergency Department
Admission: EM | Admit: 2024-02-20 | Discharge: 2024-02-20 | Disposition: A | Discharge status: Home / Self Care | Attending: Emergency Medicine | Admitting: Emergency Medicine

## 2024-02-20 DIAGNOSIS — Z992 Dependence on renal dialysis: Secondary | ICD-10-CM | POA: Diagnosis not present

## 2024-02-20 DIAGNOSIS — J189 Pneumonia, unspecified organism: Secondary | ICD-10-CM | POA: Diagnosis not present

## 2024-02-20 DIAGNOSIS — E871 Hypo-osmolality and hyponatremia: Secondary | ICD-10-CM | POA: Insufficient documentation

## 2024-02-20 DIAGNOSIS — R0602 Shortness of breath: Secondary | ICD-10-CM | POA: Diagnosis present

## 2024-02-20 DIAGNOSIS — N186 End stage renal disease: Secondary | ICD-10-CM | POA: Insufficient documentation

## 2024-02-20 DIAGNOSIS — D72829 Elevated white blood cell count, unspecified: Secondary | ICD-10-CM | POA: Insufficient documentation

## 2024-02-20 DIAGNOSIS — R0600 Dyspnea, unspecified: Secondary | ICD-10-CM

## 2024-02-20 LAB — COMPREHENSIVE METABOLIC PANEL WITH GFR
ALT: 16 U/L (ref 0–44)
AST: 18 U/L (ref 15–41)
Albumin: 3.6 g/dL (ref 3.5–5.0)
Alkaline Phosphatase: 84 U/L (ref 38–126)
Anion gap: 20 — ABNORMAL HIGH (ref 5–15)
BUN: 59 mg/dL — ABNORMAL HIGH (ref 6–20)
CO2: 22 mmol/L (ref 22–32)
Calcium: 7.8 mg/dL — ABNORMAL LOW (ref 8.9–10.3)
Chloride: 91 mmol/L — ABNORMAL LOW (ref 98–111)
Creatinine, Ser: 10.08 mg/dL — ABNORMAL HIGH (ref 0.44–1.00)
GFR, Estimated: 4 mL/min — ABNORMAL LOW (ref >60)
Glucose, Bld: 486 mg/dL — ABNORMAL HIGH (ref 70–99)
Potassium: 4.4 mmol/L (ref 3.5–5.1)
Sodium: 133 mmol/L — ABNORMAL LOW (ref 135–145)
Total Bilirubin: 1 mg/dL (ref 0.0–1.2)
Total Protein: 7 g/dL (ref 6.5–8.1)

## 2024-02-20 LAB — CBC
HCT: 32.2 % — ABNORMAL LOW (ref 36.0–46.0)
Hemoglobin: 10.2 g/dL — ABNORMAL LOW (ref 12.0–15.0)
MCH: 33.1 pg (ref 26.0–34.0)
MCHC: 31.7 g/dL (ref 30.0–36.0)
MCV: 104.5 fL — ABNORMAL HIGH (ref 80.0–100.0)
Platelets: 253 10*3/uL (ref 150–400)
RBC: 3.08 MIL/uL — ABNORMAL LOW (ref 3.87–5.11)
RDW: 14.4 % (ref 11.5–15.5)
WBC: 12.5 10*3/uL — ABNORMAL HIGH (ref 4.0–10.5)
nRBC: 0 % (ref 0.0–0.2)

## 2024-02-20 LAB — CBG MONITORING, ED
Glucose-Capillary: 436 mg/dL — ABNORMAL HIGH (ref 70–99)
Glucose-Capillary: 361 mg/dL — ABNORMAL HIGH (ref 70–99)

## 2024-02-20 MED ORDER — AZITHROMYCIN 250 MG PO TABS
250.0000 mg | ORAL_TABLET | Freq: Every day | ORAL | 0 refills | Status: DC
Start: 2024-02-20 — End: 2024-03-10

## 2024-02-20 MED ORDER — CEPHALEXIN 500 MG PO CAPS
500.0000 mg | ORAL_CAPSULE | Freq: Once | ORAL | Status: AC
  Administered 2024-02-20: 500 mg via ORAL
  Filled 2024-02-20: qty 1

## 2024-02-20 MED ORDER — AZITHROMYCIN 500 MG PO TABS
500.0000 mg | ORAL_TABLET | Freq: Once | ORAL | Status: AC
  Administered 2024-02-20: 500 mg via ORAL
  Filled 2024-02-20: qty 1

## 2024-02-20 MED ORDER — ALBUTEROL SULFATE HFA 108 (90 BASE) MCG/ACT IN AERS: 2.0000 | INHALATION_SPRAY | Freq: Four times a day (QID) | RESPIRATORY_TRACT | 2 refills | Status: AC | PRN

## 2024-02-20 MED ORDER — INSULIN ASPART 100 UNIT/ML IJ SOLN
8.0000 [IU] | Freq: Once | INTRAMUSCULAR | Status: AC
  Administered 2024-02-20: 8 [IU] via INTRAVENOUS
  Filled 2024-02-20: qty 1

## 2024-02-20 MED ORDER — CEPHALEXIN 250 MG PO CAPS: 250.0000 mg | ORAL_CAPSULE | Freq: Every day | ORAL | 0 refills | Status: DC

## 2024-02-20 MED ORDER — SODIUM CHLORIDE 0.9 % IV BOLUS
500.0000 mL | Freq: Once | INTRAVENOUS | Status: AC
  Administered 2024-02-20: 500 mL via INTRAVENOUS

## 2024-02-20 NOTE — ED Triage Notes (Signed)
 Pt presented to ED BIBA from home with c/o shortness of breath. States cannot catch her breath, pt talking in full sentences. States dialysis M,W,F, states at last dialysis she had an asthma attack. States used a nebulizer today without relief of symptoms. Per EMS, 12 lead showed LBBB with rate in 50s. Hx dialysis, asthma. BGL 467. States did not take insulin  today.

## 2024-02-20 NOTE — ED Notes (Signed)
 MD made aware pt had a breathing attack.

## 2024-02-20 NOTE — ED Notes (Signed)
 Pt provided with ice chips, okay per MD.

## 2024-02-20 NOTE — ED Notes (Signed)
 Pt's spouse flagged this RN down that his wife was having a hard time breathing. This RN went to bedside and pt was sitting up on the edge of the bed stating she was having a breathing attack. This RN reassured pt that her O2 sats were 100 on room air and respirations were 23. I placed pt on 2L [place] for comfort measures. Pt's RN made aware.

## 2024-02-20 NOTE — ED Provider Notes (Addendum)
 [facility] Provider Note    Event Date/Time   First MD Initiated Contact with Patient 02/20/24 2004     (approximate)  History   Chief Complaint: Shortness of Breath  HPI  [name] is a 58 y.o. female with a past medical history of anxiety, anemia, ESRD on HD Monday/Wednesday/Friday, hypertension, presents to the emergency department for shortness of breath.  According to the patient over the past week or so she has intermittently been feeling short of breath.  Patient states she has been using a mist of water  and peppermint oil for her shortness of breath.  States it has been working intermittently but her shortness of breath worsened tonight so she came to the emergency department for evaluation.  Patient states she believes the shortness of breath is due to her asthma although states she has never been diagnosed with asthma but has self-diagnosed herself.  Patient states she has her nephrologist Dr. Marcelino, but does not have a primary care doctor she states her last primary care doctor was not treating her correctly so she left and has not found a new one.  Patient denies any chest pain.  Patient states she had a full dialysis session on Friday.  Currently patient appears well, speaking in full sentences.  Satting 100% on room air.  No respiratory distress.  Physical Exam   Triage Vital Signs: ED Triage Vitals  Encounter Vitals Group     BP 02/20/24 2007 (!) 140/83     Girls Systolic BP Percentile --      Girls Diastolic BP Percentile --      Boys Systolic BP Percentile --      Boys Diastolic BP Percentile --      Pulse Rate 02/20/24 2007 (!) 50     Resp 02/20/24 2007 14     Temp 02/20/24 2024 97.9 F (36.6 C)     Temp Source 02/20/24 2024 Oral     SpO2 02/20/24 2007 99 %     Weight 02/20/24 2015 184 lb (83.5 kg)     Height 02/20/24 2015 5' 7 (1.702 m)     Head Circumference --      Peak Flow --      Pain Score 02/20/24 2007 0     Pain Loc --       Pain Education --      Exclude from Growth Chart --     Most recent vital signs: Vitals:   02/20/24 2007 02/20/24 2024  BP: (!) 140/83   Pulse: (!) 50   Resp: 14   Temp:  97.9 F (36.6 C)  SpO2: 99%     General: Awake, no distress.  CV:  Good peripheral perfusion.  Regular rate and rhythm  Resp:  Normal effort.  Equal breath sounds bilaterally.  Abd:  No distention.  Soft, nontender.  No rebound or guarding. Other:  Right subclavian dialysis line   ED Results / Procedures / Treatments   EKG  EKG viewed and interpreted by myself shows a normal sinus rhythm at 51 bpm with a slightly widened QRS, right axis deviation, no concerning ST changes.  RADIOLOGY  I reviewed interpret the chest x-ray images.  No significant abnormality on my evaluation. Radiology is read the chest x-ray as minimal patchy airspace opacity in the left lower lobe possibly consistent with atelectasis versus pneumonia.   MEDICATIONS ORDERED IN ED: Medications  sodium chloride  0.9 % bolus 500 mL (has no administration in time range)  IMPRESSION / MDM / ASSESSMENT AND PLAN / ED COURSE  I reviewed the triage vital signs and the nursing notes.  Patient's presentation is most consistent with acute presentation with potential threat to life or bodily function.  Patient presents emergency department for shortness of breath intermittent over the past 1 week.  No chest pain.  Clear lung sounds.  No respiratory distress.  Satting 100% on room air.  We will check labs, patient is a diabetic and states she did not take her insulin  today.  We will do the chest x-ray.  Patient denies any cough or congestion.  No wheeze on examination.  Will continue close monitor while awaiting results.  Patient's x-ray is consistent with left lower lobe opacity possibly pneumonia.  Given the patient's symptoms of worsening shortness of breath over the past 1 week we will treat with antibiotics as a precaution.  Patient's lab  work shows a slight leukocytosis otherwise reassuring CBC, largely reassuring chemistry given her dialysis with a normal potassium.  Patient has dialysis scheduled for tomorrow.  Patient's blood glucose however was found on chemistry to be elevated to 486 we will dose insulin  as the patient states she did not take any insulin  today.  We will start the patient on antibiotics for likely pneumonia have the patient follow-up with her nephrologist.  Blood sugars decreasing.  Patient is feeling better.  Will discharge home.  FINAL CLINICAL IMPRESSION(S) / ED DIAGNOSES   Dyspnea Hyperglycemia Left lower lobe pneumonia  Note:  This document was prepared using Dragon voice recognition software and may include unintentional dictation errors.   [name], MD 02/20/24 2212    [name], MD 02/20/24 2255

## 2024-03-02 ENCOUNTER — Other Ambulatory Visit: Payer: Self-pay | Admitting: Internal Medicine

## 2024-03-02 ENCOUNTER — Inpatient Hospital Stay (HOSPITAL_COMMUNITY)
Admit: 2024-03-02 | Discharge: 2024-03-10 | DRG: 228 | Disposition: A | Discharge status: Home / Self Care | Payer: PRIVATE HEALTH INSURANCE | Source: Other Acute Inpatient Hospital | Attending: Internal Medicine | Admitting: Internal Medicine

## 2024-03-02 ENCOUNTER — Encounter (HOSPITAL_COMMUNITY): Payer: Self-pay

## 2024-03-02 ENCOUNTER — Emergency Department
Admission: EM | Admit: 2024-03-02 | Discharge: 2024-03-02 | Disposition: A | Discharge status: Other Acute Inpatient Hospital | Payer: PRIVATE HEALTH INSURANCE | Attending: Emergency Medicine | Admitting: Emergency Medicine

## 2024-03-02 ENCOUNTER — Emergency Department: Payer: PRIVATE HEALTH INSURANCE

## 2024-03-02 DIAGNOSIS — Z992 Dependence on renal dialysis: Secondary | ICD-10-CM | POA: Diagnosis not present

## 2024-03-02 DIAGNOSIS — N186 End stage renal disease: Secondary | ICD-10-CM | POA: Diagnosis present

## 2024-03-02 DIAGNOSIS — E875 Hyperkalemia: Secondary | ICD-10-CM | POA: Diagnosis not present

## 2024-03-02 DIAGNOSIS — D631 Anemia in chronic kidney disease: Secondary | ICD-10-CM | POA: Diagnosis present

## 2024-03-02 DIAGNOSIS — Z811 Family history of alcohol abuse and dependence: Secondary | ICD-10-CM

## 2024-03-02 DIAGNOSIS — E1342 Other specified diabetes mellitus with diabetic polyneuropathy: Secondary | ICD-10-CM | POA: Diagnosis present

## 2024-03-02 DIAGNOSIS — E1322 Other specified diabetes mellitus with diabetic chronic kidney disease: Secondary | ICD-10-CM | POA: Diagnosis present

## 2024-03-02 DIAGNOSIS — J45909 Unspecified asthma, uncomplicated: Secondary | ICD-10-CM | POA: Diagnosis present

## 2024-03-02 DIAGNOSIS — R001 Bradycardia, unspecified: Secondary | ICD-10-CM | POA: Diagnosis present

## 2024-03-02 DIAGNOSIS — E876 Hypokalemia: Secondary | ICD-10-CM

## 2024-03-02 DIAGNOSIS — Z8249 Family history of ischemic heart disease and other diseases of the circulatory system: Secondary | ICD-10-CM | POA: Diagnosis not present

## 2024-03-02 DIAGNOSIS — R7989 Other specified abnormal findings of blood chemistry: Secondary | ICD-10-CM

## 2024-03-02 DIAGNOSIS — I442 Atrioventricular block, complete: Principal | ICD-10-CM | POA: Diagnosis present

## 2024-03-02 DIAGNOSIS — K219 Gastro-esophageal reflux disease without esophagitis: Secondary | ICD-10-CM | POA: Diagnosis present

## 2024-03-02 DIAGNOSIS — Z96643 Presence of artificial hip joint, bilateral: Secondary | ICD-10-CM | POA: Diagnosis present

## 2024-03-02 DIAGNOSIS — D649 Anemia, unspecified: Secondary | ICD-10-CM

## 2024-03-02 DIAGNOSIS — I2489 Other forms of acute ischemic heart disease: Secondary | ICD-10-CM | POA: Diagnosis present

## 2024-03-02 DIAGNOSIS — Z90710 Acquired absence of both cervix and uterus: Secondary | ICD-10-CM

## 2024-03-02 DIAGNOSIS — Z8673 Personal history of transient ischemic attack (TIA), and cerebral infarction without residual deficits: Secondary | ICD-10-CM

## 2024-03-02 DIAGNOSIS — E1365 Other specified diabetes mellitus with hyperglycemia: Secondary | ICD-10-CM | POA: Diagnosis present

## 2024-03-02 DIAGNOSIS — Z833 Family history of diabetes mellitus: Secondary | ICD-10-CM

## 2024-03-02 DIAGNOSIS — N2581 Secondary hyperparathyroidism of renal origin: Secondary | ICD-10-CM | POA: Diagnosis present

## 2024-03-02 DIAGNOSIS — Z888 Allergy status to other drugs, medicaments and biological substances status: Secondary | ICD-10-CM

## 2024-03-02 DIAGNOSIS — I5022 Chronic systolic (congestive) heart failure: Secondary | ICD-10-CM | POA: Diagnosis present

## 2024-03-02 DIAGNOSIS — R0602 Shortness of breath: Secondary | ICD-10-CM

## 2024-03-02 DIAGNOSIS — I340 Nonrheumatic mitral (valve) insufficiency: Secondary | ICD-10-CM | POA: Diagnosis present

## 2024-03-02 DIAGNOSIS — R600 Localized edema: Secondary | ICD-10-CM

## 2024-03-02 DIAGNOSIS — Z794 Long term (current) use of insulin: Secondary | ICD-10-CM | POA: Diagnosis not present

## 2024-03-02 DIAGNOSIS — Z91041 Radiographic dye allergy status: Secondary | ICD-10-CM

## 2024-03-02 DIAGNOSIS — I5A Non-ischemic myocardial injury (non-traumatic): Secondary | ICD-10-CM | POA: Diagnosis present

## 2024-03-02 DIAGNOSIS — R079 Chest pain, unspecified: Secondary | ICD-10-CM

## 2024-03-02 DIAGNOSIS — I132 Hypertensive heart and chronic kidney disease with heart failure and with stage 5 chronic kidney disease, or end stage renal disease: Secondary | ICD-10-CM | POA: Diagnosis present

## 2024-03-02 DIAGNOSIS — Z91158 Patient's noncompliance with renal dialysis for other reason: Secondary | ICD-10-CM

## 2024-03-02 DIAGNOSIS — I441 Atrioventricular block, second degree: Secondary | ICD-10-CM | POA: Diagnosis not present

## 2024-03-02 DIAGNOSIS — I472 Ventricular tachycardia, unspecified: Secondary | ICD-10-CM | POA: Diagnosis not present

## 2024-03-02 DIAGNOSIS — Z006 Encounter for examination for normal comparison and control in clinical research program: Secondary | ICD-10-CM | POA: Diagnosis not present

## 2024-03-02 DIAGNOSIS — R0789 Other chest pain: Secondary | ICD-10-CM | POA: Diagnosis present

## 2024-03-02 DIAGNOSIS — Z743 Need for continuous supervision: Secondary | ICD-10-CM | POA: Diagnosis not present

## 2024-03-02 DIAGNOSIS — Z825 Family history of asthma and other chronic lower respiratory diseases: Secondary | ICD-10-CM

## 2024-03-02 LAB — BASIC METABOLIC PANEL WITH GFR
Anion gap: 20 — ABNORMAL HIGH (ref 5–15)
BUN: 32 mg/dL — ABNORMAL HIGH (ref 6–20)
CO2: 24 mmol/L (ref 22–32)
Calcium: 7.9 mg/dL — ABNORMAL LOW (ref 8.9–10.3)
Chloride: 93 mmol/L — ABNORMAL LOW (ref 98–111)
Creatinine, Ser: 6.78 mg/dL — ABNORMAL HIGH (ref 0.44–1.00)
GFR, Estimated: 7 mL/min — ABNORMAL LOW (ref >60)
Glucose, Bld: 339 mg/dL — ABNORMAL HIGH (ref 70–99)
Potassium: 3.2 mmol/L — ABNORMAL LOW (ref 3.5–5.1)
Sodium: 137 mmol/L (ref 135–145)

## 2024-03-02 LAB — TSH: TSH: 2.406 u[IU]/mL (ref 0.350–4.500)

## 2024-03-02 LAB — CBC
HCT: 34.1 % — ABNORMAL LOW (ref 36.0–46.0)
Hemoglobin: 10.8 g/dL — ABNORMAL LOW (ref 12.0–15.0)
MCH: 32.9 pg (ref 26.0–34.0)
MCHC: 31.7 g/dL (ref 30.0–36.0)
MCV: 104 fL — ABNORMAL HIGH (ref 80.0–100.0)
Platelets: 218 10*3/uL (ref 150–400)
RBC: 3.28 MIL/uL — ABNORMAL LOW (ref 3.87–5.11)
RDW: 14.8 % (ref 11.5–15.5)
WBC: 7 10*3/uL (ref 4.0–10.5)
nRBC: 0 % (ref 0.0–0.2)

## 2024-03-02 LAB — TROPONIN I (HIGH SENSITIVITY)
Troponin I (High Sensitivity): 51 ng/L — ABNORMAL HIGH (ref <18)
Troponin I (High Sensitivity): 51 ng/L — ABNORMAL HIGH (ref <18)

## 2024-03-02 MED ORDER — INSULIN ASPART 100 UNIT/ML IJ SOLN
0.0000 [IU] | Freq: Every day | INTRAMUSCULAR | Status: DC
  Administered 2024-03-03: 2 [IU] via SUBCUTANEOUS
  Administered 2024-03-05: 3 [IU] via SUBCUTANEOUS
  Administered 2024-03-07: 2 [IU] via SUBCUTANEOUS
  Administered 2024-03-09: 5 [IU] via SUBCUTANEOUS

## 2024-03-02 MED ORDER — INSULIN ASPART 100 UNIT/ML IJ SOLN
0.0000 [IU] | Freq: Three times a day (TID) | INTRAMUSCULAR | Status: DC
  Administered 2024-03-03: 1 [IU] via SUBCUTANEOUS
  Administered 2024-03-03 (×2): 2 [IU] via SUBCUTANEOUS
  Administered 2024-03-04: 1 [IU] via SUBCUTANEOUS
  Administered 2024-03-04: 3 [IU] via SUBCUTANEOUS
  Administered 2024-03-04: 4 [IU] via SUBCUTANEOUS
  Administered 2024-03-05 (×3): 3 [IU] via SUBCUTANEOUS
  Administered 2024-03-06 (×2): 1 [IU] via SUBCUTANEOUS
  Administered 2024-03-06 – 2024-03-07 (×2): 2 [IU] via SUBCUTANEOUS
  Administered 2024-03-07 – 2024-03-08 (×2): 1 [IU] via SUBCUTANEOUS
  Administered 2024-03-08 – 2024-03-09 (×3): 2 [IU] via SUBCUTANEOUS
  Administered 2024-03-09: 1 [IU] via SUBCUTANEOUS
  Administered 2024-03-10: 5 [IU] via SUBCUTANEOUS

## 2024-03-02 MED ORDER — MELATONIN 3 MG PO TABS
6.0000 mg | ORAL_TABLET | Freq: Every evening | ORAL | Status: DC | PRN
  Administered 2024-03-09: 6 mg via ORAL
  Filled 2024-03-02: qty 2

## 2024-03-02 MED ORDER — SODIUM CHLORIDE 0.9% FLUSH
3.0000 mL | Freq: Two times a day (BID) | INTRAVENOUS | Status: DC
  Administered 2024-03-03 – 2024-03-10 (×14): 3 mL via INTRAVENOUS

## 2024-03-02 MED ORDER — POTASSIUM CHLORIDE CRYS ER 20 MEQ PO TBCR
20.0000 meq | EXTENDED_RELEASE_TABLET | Freq: Once | ORAL | Status: AC
  Administered 2024-03-03: 20 meq via ORAL
  Filled 2024-03-02: qty 1

## 2024-03-02 MED ORDER — POLYETHYLENE GLYCOL 3350 17 G PO PACK: 17.0000 g | PACK | Freq: Every day | ORAL | Status: DC | PRN

## 2024-03-02 MED ORDER — ACETAMINOPHEN 500 MG PO TABS
1000.0000 mg | ORAL_TABLET | Freq: Four times a day (QID) | ORAL | Status: DC | PRN
  Administered 2024-03-09: 1000 mg via ORAL
  Filled 2024-03-02 (×4): qty 2

## 2024-03-02 MED ORDER — ALBUTEROL SULFATE (2.5 MG/3ML) 0.083% IN NEBU: 2.5000 mg | INHALATION_SOLUTION | RESPIRATORY_TRACT | Status: DC | PRN

## 2024-03-02 NOTE — ED Notes (Signed)
 Called [facility] for transfer to [facility]  per Cardiology [phone]

## 2024-03-02 NOTE — ED Notes (Signed)
 Report given to [facility], eta 30 minutes

## 2024-03-02 NOTE — ED Notes (Signed)
 Pt placed on zoll. IV access obtained and blood drawn. EDP at bedside

## 2024-03-02 NOTE — ED Notes (Signed)
 Accepted to [facility] pending bed assignment

## 2024-03-02 NOTE — ED Triage Notes (Addendum)
 Pt comes with cp tightness that started three weeks ago. Pt did come to hospital for sob recently. Pt is dialysis pt MWF. Pt was dx with pneumonia and placed on meds. Pt now states her HR is in the 40s which is not normal.   Pt has bilateral leg swelling not normal. Pt just had dialysis yesterday.

## 2024-03-02 NOTE — ED Notes (Signed)
 Cardiologist at bedside.

## 2024-03-02 NOTE — Consult Note (Signed)
 Cardiology Consultation   Patient ID: [name] MRN: [id number]; DOB: Jan 19, 1966  Admit date: 03/02/2024 Date of Consult: 03/02/2024  PCP:  [name], No   [facility] Providers Cardiologist:  None      New consult completed by Dr Perla  Patient Profile: [name] is a 58 y.o. female with a hx of end-stage renal disease on hemodialysis Monday, Wednesday, Friday, anemia of chronic disease, type 2 diabetes, GERD, hypertension, neuromuscular disorder, prior stroke (2019), GERD who is being seen 03/02/2024 for the evaluation of complete heart block at the request of Dr Levander.  History of Present Illness: [name] previously presented to the [facility] emergency department 02/20/2024 with complaint of shortness of breath.  She states she was unable to catch her breath but was able to talk in full sentences.  States that she believes that she had an asthma attack and she used her nebulizer earlier without relief of symptoms.  Per EMS twelve-lead showed left bundle branch block with a rate of 57.  Blood pressure was 140/83, pulse 50, respirations 14, temperature 97.9.  She was given 500 mL normal saline bolus.  X-rays were consistent with left lower lobe opacity possibly pneumonia.  She was also noted to have elevated blood glucose of 486 she was treated with insulin  and started on antibiotics and was discharged home to follow-up with nephrology.  Unfortunately she has not had a PCP.  She presented back to the emergency department today with complaints of chest tightness that started approximately 3 weeks ago.  She was recently evaluated in the hospital for shortness of breath.  She does dialysis M/W/F.  Was recently diagnosed with pneumonia and placed on antibiotics.  States her heart rate dropped into the 40's.  She does have bilateral leg swelling and she just had dialysis yesterday.  She was placed on bedside monitor and was noted to have a critical EKG with low heart rate. IV access was obtained and  blood was drawn. Patient stated that heart rate improved with oxygen therapy during dialysis. She was placed on IV antibiotics by her Nephrologist.  States that she thought this was an oxygenation issue since oxygen previously helped.  She is also self diagnosed asthma patient who has been having breathing problems.  Her husband remains at the bedside. Initial vitals were heart rate of 48, blood pressure 139.58, temp of 98, respirations of 20.  Pertinent labs hemoglobin 10.8, potassium 3.2, serum creatinine 6.78, calcium  7.9, BUN 32, chloride 93, high-sensitivity troponin 51.  Chest x-ray revealed low lung volumes no acute cardiopulmonary findings with stable cardiomegaly.  Cardiology was consulted to assist with ongoing management of 2:1 AV block versus complete heart block.  Past Medical History:  Diagnosis Date   Anemia    vitamin d3 deficiency   Anxiety    Chronic kidney disease    End Stage Renal Disease   Diabetes mellitus without complication (HCC)    GERD (gastroesophageal reflux disease)    nothing over last few years   High serum parathyroid hormone (PTH)    checked through Dialysis   History of kidney stones 2000   Hypertension    Neuromuscular disorder (HCC)    neuropathy in feet   PONV (postoperative nausea and vomiting)    severe nausea requiring many doses of post op antiemetics   Stroke (HCC) 10/2017   thinks she had a series of mini strokes.right leg up to right side of face were numb. no loss of consciousness    Past  Surgical History:  Procedure Laterality Date   ABDOMINAL HYSTERECTOMY  2007   APPLICATION OF WOUND VAC Left 11/15/2021   Procedure: APPLICATION OF WOUND VAC;  Surgeon: Krasinski, Kevin, MD;  Location: [facility] ORS;  Service: Orthopedics;  Laterality: Left;  Prevena 13cm    APPLICATION OF WOUND VAC Right 12/14/2021   Procedure: APPLICATION OF WOUND VAC;  Surgeon: Tobie Priest, MD;  Location: [facility] ORS;  Service: Orthopedics;  Laterality: Right;  [id number]    AV FISTULA INSERTION W/ RF MAGNETIC GUIDANCE N/A 12/08/2017   Procedure: AV FISTULA INSERTION W/RF MAGNETIC GUIDANCE;  Surgeon: Jama Cordella MATSU, MD;  Location: [facility] INVASIVE CV LAB;  Service: Cardiovascular;  Laterality: N/A;   DIALYSIS/PERMA CATHETER INSERTION Right 10/03/2021   Procedure: DIALYSIS/PERMA CATHETER INSERTION;  Surgeon: Jama Cordella MATSU, MD;  Location: [facility] INVASIVE CV LAB;  Service: Cardiovascular;  Laterality: Right;   HIP ARTHROPLASTY Left 09/29/2021   Procedure: ARTHROPLASTY BIPOLAR HIP (HEMIARTHROPLASTY);  Surgeon: Leora Lynwood SAUNDERS, MD;  Location: [facility] ORS;  Service: Orthopedics;  Laterality: Left;   HIP ARTHROPLASTY Right 12/14/2021   Procedure: ARTHROPLASTY BIPOLAR HIP (HEMIARTHROPLASTY);  Surgeon: Tobie Priest, MD;  Location: [facility] ORS;  Service: Orthopedics;  Laterality: Right;   INCISION AND DRAINAGE HIP Left 11/15/2021   Procedure: IRRIGATION AND DEBRIDEMENT LEFT HIP WOUND;  Surgeon: Marchia Drivers, MD;  Location: [facility] ORS;  Service: Orthopedics;  Laterality: Left;   QUADRICEPS TENDON REPAIR Right 10/16/2021   Procedure: REPAIR QUADRICEP TENDON;  Surgeon: Doll Skates, MD;  Location: [facility];  Service: Orthopedics;  Laterality: Right;   ROTATOR CUFF REPAIR Right 2004   SHOULDER CLOSED REDUCTION Right 2004   UPPER EXTREMITY VENOGRAPHY Left 02/15/2018   Procedure: UPPER EXTREMITY VENOGRAPHY;  Surgeon: Jama Cordella MATSU, MD;  Location: [facility] INVASIVE CV LAB;  Service: Cardiovascular;  Laterality: Left;     Home Medications:  Prior to Admission medications   Medication Sig Start Date End Date Taking? Authorizing Provider  acetaminophen  (TYLENOL ) 325 MG tablet Take 2 tablets (650 mg total) by mouth 3 (three) times daily. 11/07/21   Angiulli, Toribio PARAS, PA-C  albuterol  (VENTOLIN  HFA) 108 (90 Base) MCG/ACT inhaler Inhale 2 puffs into the lungs every 6 (six) hours as needed for wheezing or shortness of breath. 02/20/24   [name], MD  azithromycin  (ZITHROMAX ) 250 MG  tablet Take 1 tablet (250 mg total) by mouth daily. 02/20/24   [name], MD  cephALEXin  (KEFLEX ) 250 MG capsule Take 1 capsule (250 mg total) by mouth daily. 02/20/24   [name], MD  docusate sodium  (COLACE) 100 MG capsule Take 1 capsule (100 mg total) by mouth 2 (two) times daily. 12/18/21   Patel, Sona, MD  heparin  5000 UNIT/ML injection Inject 1 mL (5,000 Units total) into the skin every 8 (eight) hours for 14 days. 12/18/21 01/01/22  Patel, Sona, MD  insulin  glargine-yfgn (SEMGLEE , YFGN,) 100 UNIT/ML Pen Inject 12 Units into the skin daily. 12/18/21   Patel, Sona, MD  losartan  (COZAAR ) 50 MG tablet Take 50 mg by mouth daily. 12/03/21   [provider]  ondansetron  (ZOFRAN ) 4 MG tablet Take 1 tablet (4 mg total) by mouth every 6 (six) hours as needed for nausea. 12/15/21   Verlinda Boas, PA-C  polyethylene glycol (MIRALAX  / GLYCOLAX ) 17 g packet Take 17 g by mouth daily. 12/19/21   Patel, Sona, MD  Vitamin D , Ergocalciferol , (DRISDOL ) 1.25 MG (50000 UNIT) CAPS capsule Take 1 capsule (50,000 Units total) by mouth every 7 (seven) days. 11/10/21   Angiulli,  Toribio PARAS, PA-C    Scheduled Meds:  Continuous Infusions:  PRN Meds:   Allergies:    Allergies  Allergen Reactions   Enalapril Hives and Other (See Comments)    Angioedema face.   Ivp Dye [Iodinated Contrast Media] Hives   Lisinopril Shortness Of Breath    Social History:   Social History   Socioeconomic History   Marital status: Married    Spouse name: Redell   Number of children: 0   Years of education: Not on file   Highest education level: Some college, no degree  Occupational History   Occupation: Scientist, water quality  Tobacco Use   Smoking status: Never   Smokeless tobacco: Never  Vaping Use   Vaping status: Never Used  Substance and Sexual Activity   Alcohol use: No   Drug use: Never   Sexual activity: Yes    Partners: Male  Other Topics Concern   Not on file  Social History Narrative    Not on file   Social Drivers of Health   Financial Resource Strain: Low Risk  (03/07/2018)   Overall Financial Resource Strain (CARDIA)    Difficulty of Paying Living Expenses: Not hard at all  Food Insecurity: No Food Insecurity (03/07/2018)   Hunger Vital Sign    Worried About Running Out of Food in the Last Year: Never true    Ran Out of Food in the Last Year: Never true  Transportation Needs: No Transportation Needs (03/07/2018)   PRAPARE - Administrator, Civil Service (Medical): No    Lack of Transportation (Non-Medical): No  Physical Activity: Unknown (01/03/2019)   Exercise Vital Sign    Days of Exercise per Week: 3 days    Minutes of Exercise per Session: Not on file  Stress: No Stress Concern Present (03/07/2018)   [employer] of Occupational Health - Occupational Stress Questionnaire    Feeling of Stress : Not at all  Social Connections: Moderately Integrated (03/07/2018)   Social Connection and Isolation Panel    Frequency of Communication with Friends and Family: More than three times a week    Frequency of Social Gatherings with Friends and Family: More than three times a week    Attends Religious Services: More than 4 times per year    Active Member of [employer] or Organizations: No    Attends Banker Meetings: Never    Marital Status: Married  Catering manager Violence: Not At Risk (03/07/2018)   Humiliation, Afraid, Rape, and Kick questionnaire    Fear of Current or Ex-Partner: No    Emotionally Abused: No    Physically Abused: No    Sexually Abused: No    Family History:    Family History  Problem Relation Age of Onset   Emphysema Mother    COPD Mother    Cancer Father    Cancer Paternal Aunt    Diabetes Sister    Hypertension Sister    Eczema Sister    Diabetes Brother    Hypertension Brother    Cardiomyopathy Brother    Alcohol abuse Brother      ROS:  Please see the history of present illness.  Review of Systems   Constitutional:  Positive for malaise/fatigue.  Respiratory:  Positive for shortness of breath.   Cardiovascular:  Positive for chest pain and leg swelling.  Neurological:  Positive for weakness.    All other ROS reviewed and negative.     Physical Exam/Data: Vitals:  03/02/24 1248 03/02/24 1300 03/02/24 1315  BP: (!) 139/58    Pulse: (!) 48 (!) 49 (!) 48  Resp: 20 17 18   Temp: 98 F (36.7 C)    SpO2: 98% 100% 97%   No intake or output data in the 24 hours ending 03/02/24 1337    02/20/2024    8:15 PM 12/29/2021    2:58 PM 12/17/2021   12:20 PM  Last 3 Weights  Weight (lbs) 184 lb 157 lb 168 lb 10.4 oz  Weight (kg) 83.462 kg 71.215 kg 76.5 kg     There is no height or weight on file to calculate BMI.  General:  Well nourished, well developed, in no acute distress HEENT: normal Neck: no JVD Vascular: No carotid bruits; Distal pulses 2+ bilaterally Cardiac:  normal S1, S2; RRR; bradycardic,  II/VI systolic murmur  Lungs:  clear to auscultation bilaterally, no wheezing, rhonchi or rales  Abd: soft, nontender, no hepatomegaly  Ext: 1+ edema BLE Musculoskeletal:  No deformities, BUE and BLE strength normal and equal Skin: warm and dry  Neuro:  CNs 2-12 intact, no focal abnormalities noted Psych:  Normal affect   EKG:  The EKG was personally reviewed and demonstrates: Complete heart block with a rate of 49 Telemetry:  Telemetry was personally reviewed and demonstrates:  2:1 AV Block with rates in the 40's  Relevant CV Studies: Echocardiogram ordered and pending with further recommendations to follow  Laboratory Data: High Sensitivity Troponin:  No results for input(s): TROPONINIHS in the last 720 hours.   ChemistryNo results for input(s): NA, K, CL, CO2, GLUCOSE, BUN, CREATININE, CALCIUM , MG, GFRNONAA, GFRAA, ANIONGAP in the last 168 hours.  No results for input(s): PROT, ALBUMIN, AST, ALT, ALKPHOS, BILITOT in the last 168  hours. Lipids No results for input(s): CHOL, TRIG, HDL, LABVLDL, LDLCALC, CHOLHDL in the last 168 hours.  Hematology Recent Labs  Lab 03/02/24 1310  WBC 7.0  RBC 3.28*  HGB 10.8*  HCT 34.1*  MCV 104.0*  MCH 32.9  MCHC 31.7  RDW 14.8  PLT 218   Thyroid No results for input(s): TSH, FREET4 in the last 168 hours.  BNPNo results for input(s): BNP, PROBNP in the last 168 hours.  DDimer No results for input(s): DDIMER in the last 168 hours.  Radiology/Studies:  No results found.   Assessment and Plan: Complete heart block/2:1 AV block -EKG with a rate of 49 -Patient is symptomatic with chest pain and shortness of breath - Not currently on AV nodal blocking agents, continue to avoid AV nodal blocking agents and antiemetics - Hands-free pacing pads remain on patient -TSH added on blood work -Echocardiogram ordered and pending with further recommendations to follow -All other vital signs are stable with exception of heart rate -Continue on telemetry monitoring -Reached out to EP for recommendations  Chest pain/elevated high-sensitivity troponin -Patient presented with chest tightness -High-sensitivity troponin elevated at 51, continue to trend -EKG revealing complete heart block -Chest pain has resolved -No prior cardiac history -EKG as needed for pain or changes  End-stage renal disease on hemodialysis -Monday Wednesday Friday -Has been compliant with dialysis -Fluid management per dialysis as patient no longer makes urine -Ongoing management per Nephrology  Anemia of chronic disease - Hemoglobin 10.8 - Monitor CBC - No active signs of bleeding  Hypokalemia  -serum potassium 3.2 -Supplementation ordered -Recommend keeping potassium closer to 4 less than 5 -Daily bmp  Type 2 diabetes - Hemoglobin A1c 6.2 - Continue sliding scale insulin  -  Ongoing management per IM  7.   Hypertension - Blood pressure 145/71 - Recommend restarting PTA  losartan  - Vital signs per unit protocol   Risk Assessment/Risk Scores: For questions or updates, please contact [facility] Please consult [email or web address] for contact info under    Signed, Meadow Abramo, NP  03/02/2024 1:37 PM

## 2024-03-02 NOTE — ED Notes (Signed)
 Assumed care of patient, pt in no apparent distress, defib pads attached to patient and crash cart at bedside. SO at bedside.

## 2024-03-02 NOTE — ED Provider Notes (Signed)
 [facility] Provider Note    Event Date/Time   First MD Initiated Contact with Patient 03/02/24 1259     (approximate)   History   Chest Pain   HPI  [name] is a 58 year old female history of ESRD on MWF HD presenting to the emergency department for evaluation of chest tightness and bradycardia.  3 weeks ago patient began to notice intermittent episodes of chest tightness with associated dizziness.  Yesterday, the symptoms became worse.  Does report recent diagnosis of pneumonia.  Notes that her dizziness seems to be worse when her heart rate drops.     Physical Exam   Triage Vital Signs: ED Triage Vitals [03/02/24 1248]  Encounter Vitals Group     BP (!) 139/58     Girls Systolic BP Percentile      Girls Diastolic BP Percentile      Boys Systolic BP Percentile      Boys Diastolic BP Percentile      Pulse Rate (!) 48     Resp 20     Temp 98 F (36.7 C)     Temp src      SpO2 98 %     Weight      Height      Head Circumference      Peak Flow      Pain Score 2     Pain Loc      Pain Education      Exclude from Growth Chart     Most recent vital signs: Vitals:   03/02/24 1500 03/02/24 1530  BP: 137/67 127/64  Pulse: (!) 46 (!) 45  Resp: 19 (!) 22  Temp:    SpO2: 100% 98%     General: Awake, interactive  CV:  Bradycardic, good peripheral perfusion Resp:  Unlabored respirations, lungs clear to auscultation Abd:  Nondistended Neuro:  Symmetric facial movement, fluid speech Skin:  Dialysis catheter over right anterior chest wall without surrounding erythema or drainage   ED Results / Procedures / Treatments   Labs (all labs ordered are listed, but only abnormal results are displayed) Labs Reviewed  BASIC METABOLIC PANEL WITH GFR - Abnormal; Notable for the following components:      Result Value   Potassium 3.2 (*)    Chloride 93 (*)    Glucose, Bld 339 (*)    BUN 32 (*)    Creatinine, Ser 6.78 (*)    Calcium  7.9 (*)     GFR, Estimated 7 (*)    Anion gap 20 (*)    All other components within normal limits  CBC - Abnormal; Notable for the following components:   RBC 3.28 (*)    Hemoglobin 10.8 (*)    HCT 34.1 (*)    MCV 104.0 (*)    All other components within normal limits  TROPONIN I (HIGH SENSITIVITY) - Abnormal; Notable for the following components:   Troponin I (High Sensitivity) 51 (*)    All other components within normal limits  TROPONIN I (HIGH SENSITIVITY) - Abnormal; Notable for the following components:   Troponin I (High Sensitivity) 51 (*)    All other components within normal limits  TSH     EKG EKG independently reviewed and interpreted by myself demonstrates:  EKG demonstrates sinus bradycardia at a rate of 49, QRS 104 complete A-V dissociation noted  RADIOLOGY Imaging independently reviewed and interpreted by myself demonstrates:  CXR without focal consolidation  Formal Radiology Read:  ARCOLA  Chest Port 1 View Result Date: 03/02/2024 CLINICAL DATA:  Chest pain. EXAM: PORTABLE CHEST 1 VIEW COMPARISON:  02/20/2024. FINDINGS: Low lung volumes, similar to the prior exam. Defibrillator pad overlies the left chest wall. Stable cardiomegaly. Right sided CVC catheter tip overlies the superior cavoatrial junction, unchanged. No focal consolidation, pleural effusion, or pneumothorax. No acute osseous abnormality. IMPRESSION: 1. Low lung volumes.  No acute cardiopulmonary findings. 2. Stable cardiomegaly. Electronically Signed   By: Harrietta Sherry M.D.   On: 03/02/2024 14:03    PROCEDURES:  Critical Care performed: Yes, see critical care procedure note(s)  CRITICAL CARE Performed by: Nilsa Dade   Total critical care time: 31 minutes  Critical care time was exclusive of separately billable procedures and treating other patients.  Critical care was necessary to treat or prevent imminent or life-threatening deterioration.  Critical care was time spent personally by me on the following  activities: development of treatment plan with patient and/or surrogate as well as nursing, discussions with consultants, evaluation of patient's response to treatment, examination of patient, obtaining history from patient or surrogate, ordering and performing treatments and interventions, ordering and review of laboratory studies, ordering and review of radiographic studies, pulse oximetry and re-evaluation of patient's condition.   Procedures   MEDICATIONS ORDERED IN ED: Medications - No data to display   IMPRESSION / MDM / ASSESSMENT AND PLAN / ED COURSE  I reviewed the triage vital signs and the nursing notes.  Differential diagnosis includes, but is not limited to, medication adverse effect, ischemia, arrhythmia, electrolyte abnormality  Patient's presentation is most consistent with acute presentation with potential threat to life or bodily function.  58 year old female presenting with chest pain and dizziness found to have complete heart block on EKG here.  Will send labs and discussed with on-call STEMI doctor.  Clinical Course as of 03/02/24 1601  Thu Mar 02, 2024  1316 On-call STEMI doc paged in the setting of new onset complete heart block.  Dr. Anner was in the Cath Lab, but staff asked him about the patient and he recommended cardiology consult as the patient does have stable blood pressure currently. [NR]  1412 Labs resulted with mild hypokalemia with K of 3.2.  In setting of ESRD we will hold off on repletion for now.  Elevated BUN and creatinine consistent with known history of ESRD.  Troponin slightly elevated at 51, but may have some impaired clearance with her ESRD history.  Labs with stable anemia. [NR]  1520 Case discussed with Dr. Gollan with cardiology.  He evaluated the patient at bedside.  No obvious precipitant for patient's complete heart block.  We do not have EP services available at this hospital until next week, so he did reach out to Dr. Kennyth with EP at  [facility]. Dr. Kennyth recommend transfer to their facility for further management of her complete heart block, evaluation for likely pacemaker placement. Dr. Kennyth did note that patient could be transferred ER to ER or admitted directly to hospitalist service with EP consult. Signed out to oncoming physician at 1545 pending discussion with Jolynn Pack, anticipated transfer.  Dr. Gollan did note that if there is difficulty with attempts at transfer, can admit to hospitalist team here with cardiology consult until transfer can be arranged.  [NR]    Clinical Course User Index [NR] Dade Nilsa, MD     FINAL CLINICAL IMPRESSION(S) / ED DIAGNOSES   Final diagnoses:  Complete heart block (HCC)     Rx / DC  Orders   ED Discharge Orders     None        Note:  This document was prepared using Dragon voice recognition software and may include unintentional dictation errors.   Levander Slate, MD 03/02/24 [phone]

## 2024-03-02 NOTE — Progress Notes (Signed)
 Plan of Care Note for accepted transfer   Patient: [name] MRN: [id number]   DOA: (Not on file)  Facility requesting transfer: [facility] Requesting Provider: Dr. Floy EDP Reason for transfer: Services not available Facility course:   Patient presented to the ED at [facility] today with 3 weeks of intermittent chest tightness and dizziness.  Symptoms have been worse for the past day.  Noted that her heart rate is lower when she has her dizziness.  Has had ongoing workup for complete heart block and is in a complete heart block with junctional escape rhythm on EKG in ED.  Cardiology consulted [facility] and unfortunately they do not have electrophysiology over the weekend and patient will need to be transferred to [facility] for EP evaluation.  Cardiology they have discussed case with electrophysiology here and they are aware of patient and will consult on arrival.  Thus far, patient has been stable in the ED at [facility] with junctional escape rhythm.  Please consult cardiology/electrophysiology on patient arrival.  Nephrology will also need to be involved while admitted as patient is ESRD.  Plan of care: The patient is accepted for admission to Progressive unit, at [facility]..   Author: Marsa KATHEE Scurry, MD 03/02/2024  Check [email or web address] for on-call coverage.  Nursing staff, Please call [facility] Admits & Consults System-Wide number on Amion as soon as patient's arrival, so appropriate admitting provider can evaluate the pt.

## 2024-03-02 NOTE — ED Notes (Addendum)
 Pt placed on monitor. Critical EKG shown to MD Ray. RN Burnard informed of pt in room and low HR.

## 2024-03-03 ENCOUNTER — Inpatient Hospital Stay (HOSPITAL_COMMUNITY)

## 2024-03-03 DIAGNOSIS — I442 Atrioventricular block, complete: Secondary | ICD-10-CM | POA: Diagnosis not present

## 2024-03-03 DIAGNOSIS — R7989 Other specified abnormal findings of blood chemistry: Secondary | ICD-10-CM | POA: Diagnosis not present

## 2024-03-03 DIAGNOSIS — R001 Bradycardia, unspecified: Secondary | ICD-10-CM

## 2024-03-03 LAB — ECHOCARDIOGRAM COMPLETE
AR max vel: 1.26 cm2
AV Area VTI: 1.33 cm2
AV Area mean vel: 1.28 cm2
AV Mean grad: 10.5 mmHg
AV Peak grad: 27.8 mmHg
Ao pk vel: 2.64 m/s
Area-P 1/2: 2.95 cm2
Height: 67 in
MV VTI: 1.07 cm2
S' Lateral: 4.4 cm
Weight: 3008.84 [oz_av]

## 2024-03-03 LAB — LIPID PANEL
Cholesterol: 86 mg/dL (ref 0–200)
HDL: 28 mg/dL — ABNORMAL LOW (ref >40)
LDL Cholesterol: 39 mg/dL (ref 0–99)
Total CHOL/HDL Ratio: 3.1 ratio
Triglycerides: 96 mg/dL (ref <150)
VLDL: 19 mg/dL (ref 0–40)

## 2024-03-03 LAB — BASIC METABOLIC PANEL WITH GFR
Anion gap: 17 — ABNORMAL HIGH (ref 5–15)
BUN: 36 mg/dL — ABNORMAL HIGH (ref 6–20)
CO2: 25 mmol/L (ref 22–32)
Calcium: 8 mg/dL — ABNORMAL LOW (ref 8.9–10.3)
Chloride: 96 mmol/L — ABNORMAL LOW (ref 98–111)
Creatinine, Ser: 7.9 mg/dL — ABNORMAL HIGH (ref 0.44–1.00)
GFR, Estimated: 5 mL/min — ABNORMAL LOW (ref >60)
Glucose, Bld: 234 mg/dL — ABNORMAL HIGH (ref 70–99)
Potassium: 3.2 mmol/L — ABNORMAL LOW (ref 3.5–5.1)
Sodium: 138 mmol/L (ref 135–145)

## 2024-03-03 LAB — GLUCOSE, CAPILLARY
Glucose-Capillary: 148 mg/dL — ABNORMAL HIGH (ref 70–99)
Glucose-Capillary: 161 mg/dL — ABNORMAL HIGH (ref 70–99)
Glucose-Capillary: 201 mg/dL — ABNORMAL HIGH (ref 70–99)
Glucose-Capillary: 249 mg/dL — ABNORMAL HIGH (ref 70–99)
Glucose-Capillary: 250 mg/dL — ABNORMAL HIGH (ref 70–99)

## 2024-03-03 LAB — LACTIC ACID, PLASMA: Lactic Acid, Venous: 1.7 mmol/L (ref 0.5–1.9)

## 2024-03-03 LAB — CBC
HCT: 33.5 % — ABNORMAL LOW (ref 36.0–46.0)
Hemoglobin: 10.7 g/dL — ABNORMAL LOW (ref 12.0–15.0)
MCH: 33.2 pg (ref 26.0–34.0)
MCHC: 31.9 g/dL (ref 30.0–36.0)
MCV: 104 fL — ABNORMAL HIGH (ref 80.0–100.0)
Platelets: 215 K/uL (ref 150–400)
RBC: 3.22 MIL/uL — ABNORMAL LOW (ref 3.87–5.11)
RDW: 15.1 % (ref 11.5–15.5)
WBC: 8.2 K/uL (ref 4.0–10.5)
nRBC: 0.2 % (ref 0.0–0.2)

## 2024-03-03 LAB — PHOSPHORUS: Phosphorus: 6.2 mg/dL — ABNORMAL HIGH (ref 2.5–4.6)

## 2024-03-03 LAB — HEMOGLOBIN A1C
Hgb A1c MFr Bld: 9.8 % — ABNORMAL HIGH (ref 4.8–5.6)
Mean Plasma Glucose: 234.56 mg/dL

## 2024-03-03 LAB — HEPATITIS B SURFACE ANTIGEN: Hepatitis B Surface Ag: NONREACTIVE

## 2024-03-03 LAB — HIV ANTIBODY (ROUTINE TESTING W REFLEX): HIV Screen 4th Generation wRfx: NONREACTIVE

## 2024-03-03 LAB — MAGNESIUM: Magnesium: 2 mg/dL (ref 1.7–2.4)

## 2024-03-03 MED ORDER — INSULIN ASPART 100 UNIT/ML IJ SOLN
10.0000 [IU] | Freq: Three times a day (TID) | INTRAMUSCULAR | Status: DC
  Administered 2024-03-03 – 2024-03-10 (×13): 10 [IU] via SUBCUTANEOUS

## 2024-03-03 MED ORDER — HEPARIN SODIUM (PORCINE) 1000 UNIT/ML DIALYSIS
1000.0000 [IU] | INTRAMUSCULAR | Status: DC | PRN
  Administered 2024-03-03: 3200 [IU]

## 2024-03-03 MED ORDER — CHLORHEXIDINE GLUCONATE CLOTH 2 % EX PADS
6.0000 | MEDICATED_PAD | Freq: Every day | CUTANEOUS | Status: DC
  Administered 2024-03-03 – 2024-03-10 (×6): 6 via TOPICAL

## 2024-03-03 MED ORDER — POTASSIUM CHLORIDE 20 MEQ PO PACK
40.0000 meq | PACK | Freq: Once | ORAL | Status: DC
  Filled 2024-03-03: qty 2

## 2024-03-03 MED ORDER — HEPARIN SODIUM (PORCINE) 1000 UNIT/ML IJ SOLN
3000.0000 [IU] | Freq: Once | INTRAMUSCULAR | Status: AC
  Administered 2024-03-03: 3000 [IU] via INTRAVENOUS

## 2024-03-03 MED ORDER — CHLORHEXIDINE GLUCONATE CLOTH 2 % EX PADS
6.0000 | MEDICATED_PAD | Freq: Every day | CUTANEOUS | Status: DC
  Administered 2024-03-04 – 2024-03-10 (×6): 6 via TOPICAL

## 2024-03-03 MED ORDER — HEPARIN BOLUS VIA INFUSION: 3000.0000 [IU] | Freq: Once | INTRAVENOUS | Status: DC

## 2024-03-03 MED ORDER — SEVELAMER CARBONATE 800 MG PO TABS
1600.0000 mg | ORAL_TABLET | Freq: Three times a day (TID) | ORAL | Status: DC
  Administered 2024-03-03 – 2024-03-08 (×12): 1600 mg via ORAL
  Filled 2024-03-03 (×14): qty 2

## 2024-03-03 MED ORDER — POTASSIUM CHLORIDE CRYS ER 20 MEQ PO TBCR
20.0000 meq | EXTENDED_RELEASE_TABLET | Freq: Once | ORAL | Status: AC
  Administered 2024-03-03: 20 meq via ORAL
  Filled 2024-03-03: qty 1

## 2024-03-03 NOTE — H&P (Addendum)
 History and Physical    [name] FMW:[id number] DOB: 1966-04-17 DOA: 03/02/2024  PCP: Pcp, No   Patient coming from: Transfer from [facility]    Chief Complaint: SOB; CHB    HPI:  [name] is a 58 y.o. 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  Patient reports that symptoms began 3 weeks ago initially thought she was having an asthma exacerbation.  Began experiencing exertional shortness of breath and chest pressure as well as lightheadedness.  Reports that during dialysis sessions occasionally she would have transient bradycardia which would recover.  She was seen at [facility] ED on 6/22 with similar symptoms and treated for left lower lobe pneumonia and discharged on antibiotics; EKG that admission was sinus bradycardia.  Reports that in the past day she began to have more persistent symptoms and checked her heart rate on pulse ox which has remained in the 40s, called her dialysis center who referred her to the emergency department.  No change in medications, not taking AV nodal blocking agents.    Review of Systems:  ROS complete and negative except as marked above   Allergies  Allergen Reactions   Enalapril Hives and Other (See Comments)    Angioedema face.   Ivp Dye [Iodinated Contrast Media] Hives   Lisinopril Shortness Of Breath    Prior to Admission medications   Medication Sig Start Date End Date Taking? Authorizing Provider  acetaminophen  (TYLENOL ) 325 MG tablet Take 2 tablets (650 mg total) by mouth 3 (three) times daily. 11/07/21   Angiulli, Toribio PARAS, PA-C  albuterol  (VENTOLIN  HFA) 108 (90 Base) MCG/ACT inhaler Inhale 2 puffs into the lungs every 6 (six) hours as needed for wheezing or shortness of breath. 02/20/24   [name], MD  azithromycin  (ZITHROMAX ) 250 MG tablet Take 1 tablet  (250 mg total) by mouth daily. 02/20/24   [name], MD  cephALEXin  (KEFLEX ) 250 MG capsule Take 1 capsule (250 mg total) by mouth daily. 02/20/24   [name], MD  docusate sodium  (COLACE) 100 MG capsule Take 1 capsule (100 mg total) by mouth 2 (two) times daily. 12/18/21   Patel, Sona, MD  heparin  5000 UNIT/ML injection Inject 1 mL (5,000 Units total) into the skin every 8 (eight) hours for 14 days. 12/18/21 01/01/22  Patel, Sona, MD  insulin  glargine-yfgn (SEMGLEE , YFGN,) 100 UNIT/ML Pen Inject 12 Units into the skin daily. 12/18/21   Patel, Sona, MD  losartan  (COZAAR ) 50 MG tablet Take 50 mg by mouth daily. 12/03/21   [provider]  ondansetron  (ZOFRAN ) 4 MG tablet Take 1 tablet (4 mg total) by mouth every 6 (six) hours as needed for nausea. 12/15/21   Verlinda Boas, PA-C  polyethylene glycol (MIRALAX  / GLYCOLAX ) 17 g packet Take 17 g by mouth daily. 12/19/21   Patel, Sona, MD  Vitamin D , Ergocalciferol , (DRISDOL ) 1.25 MG (50000 UNIT) CAPS capsule Take 1 capsule (50,000 Units total) by mouth every 7 (seven) days. 11/10/21   Angiulli, Toribio PARAS, PA-C    Past Medical History:  Diagnosis Date   Anemia    vitamin d3 deficiency   Anxiety    Chronic kidney disease    End Stage Renal Disease   Diabetes mellitus without complication (HCC)    GERD (gastroesophageal reflux disease)    nothing over last few years   High  serum parathyroid hormone (PTH)    checked through Dialysis   History of kidney stones 2000   Hypertension    Neuromuscular disorder (HCC)    neuropathy in feet   PONV (postoperative nausea and vomiting)    severe nausea requiring many doses of post op antiemetics   Stroke (HCC) 10/2017   thinks she had a series of mini strokes.right leg up to right side of face were numb. no loss of consciousness    Past Surgical History:  Procedure Laterality Date   ABDOMINAL HYSTERECTOMY  2007   APPLICATION OF WOUND VAC Left 11/15/2021   Procedure: APPLICATION OF WOUND  VAC;  Surgeon: Krasinski, Kevin, MD;  Location: [facility] ORS;  Service: Orthopedics;  Laterality: Left;  Prevena 13cm    APPLICATION OF WOUND VAC Right 12/14/2021   Procedure: APPLICATION OF WOUND VAC;  Surgeon: Tobie Priest, MD;  Location: [facility] ORS;  Service: Orthopedics;  Laterality: Right;  [id number]   AV FISTULA INSERTION W/ RF MAGNETIC GUIDANCE N/A 12/08/2017   Procedure: AV FISTULA INSERTION W/RF MAGNETIC GUIDANCE;  Surgeon: Jama Cordella MATSU, MD;  Location: [facility] INVASIVE CV LAB;  Service: Cardiovascular;  Laterality: N/A;   DIALYSIS/PERMA CATHETER INSERTION Right 10/03/2021   Procedure: DIALYSIS/PERMA CATHETER INSERTION;  Surgeon: Jama Cordella MATSU, MD;  Location: [facility] INVASIVE CV LAB;  Service: Cardiovascular;  Laterality: Right;   HIP ARTHROPLASTY Left 09/29/2021   Procedure: ARTHROPLASTY BIPOLAR HIP (HEMIARTHROPLASTY);  Surgeon: Leora Lynwood SAUNDERS, MD;  Location: [facility] ORS;  Service: Orthopedics;  Laterality: Left;   HIP ARTHROPLASTY Right 12/14/2021   Procedure: ARTHROPLASTY BIPOLAR HIP (HEMIARTHROPLASTY);  Surgeon: Tobie Priest, MD;  Location: [facility] ORS;  Service: Orthopedics;  Laterality: Right;   INCISION AND DRAINAGE HIP Left 11/15/2021   Procedure: IRRIGATION AND DEBRIDEMENT LEFT HIP WOUND;  Surgeon: Marchia Drivers, MD;  Location: [facility] ORS;  Service: Orthopedics;  Laterality: Left;   QUADRICEPS TENDON REPAIR Right 10/16/2021   Procedure: REPAIR QUADRICEP TENDON;  Surgeon: Doll Skates, MD;  Location: [facility] OR;  Service: Orthopedics;  Laterality: Right;   ROTATOR CUFF REPAIR Right 2004   SHOULDER CLOSED REDUCTION Right 2004   UPPER EXTREMITY VENOGRAPHY Left 02/15/2018   Procedure: UPPER EXTREMITY VENOGRAPHY;  Surgeon: Jama Cordella MATSU, MD;  Location: [facility] INVASIVE CV LAB;  Service: Cardiovascular;  Laterality: Left;     reports that she has never smoked. She has never used smokeless tobacco. She reports that she does not drink alcohol and does not use drugs.  Family History  Problem  Relation Age of Onset   Emphysema Mother    COPD Mother    Cancer Father    Cancer Paternal Aunt    Diabetes Sister    Hypertension Sister    Eczema Sister    Diabetes Brother    Hypertension Brother    Cardiomyopathy Brother    Alcohol abuse Brother      Physical Exam: Vitals:   03/02/24 2226  BP: (!) 156/72  Pulse: (!) 47  Resp: 18  Temp: 98 F (36.7 C)  TempSrc: Oral  SpO2: 98%  Weight: 85.4 kg  Height: 5' 7 (1.702 m)    Gen: Awake, alert, chronically ill-appearing CV: Regular, bradycardic, normal S1, S2, 3/6 holosystolic murmur.  Dialysis cath right chest wall Resp: Normal WOB, CTAB  Abd: Flat, normoactive, nontender MSK: Symmetric, trace edema  skin: No rashes or lesions to exposed skin  Neuro: Alert and interactive  Psych: euthymic, appropriate    Data review:   Labs reviewed, notable for:  K3.2, bicarb 24, anion gap 20, creatinine 6.7 (ESRD), glucose 339, calcium  7.9 no albumin to correct High-sensitivity Trop 51 -> 51 Hemoglobin 10, MCV 104 TSH 2.4  Micro:  Results for orders placed or performed during the hospital encounter of 12/14/21  Surgical pcr screen     Status: None   Collection Time: 12/14/21 12:17 AM   Specimen: Nasal Mucosa; Nasal Swab  Result Value Ref Range Status   MRSA, PCR NEGATIVE NEGATIVE Final   Staphylococcus aureus NEGATIVE NEGATIVE Final    Comment: (NOTE) The Xpert SA Assay (FDA approved for NASAL specimens in patients 58 years of age and older), is one component of a comprehensive surveillance program. It is not intended to diagnose infection nor to guide or monitor treatment. Performed at [facility], [address], [place], [place] [address]     Imaging reviewed:  [facility] Chest [place] 1 View Result Date: 03/02/2024 CLINICAL DATA:  Chest pain. EXAM: PORTABLE CHEST 1 VIEW COMPARISON:  02/20/2024. FINDINGS: Low lung volumes, similar to the prior exam. Defibrillator pad overlies the left chest wall. Stable  cardiomegaly. Right sided CVC catheter tip overlies the superior cavoatrial junction, unchanged. No focal consolidation, pleural effusion, or pneumothorax. No acute osseous abnormality. IMPRESSION: 1. Low lung volumes.  No acute cardiopulmonary findings. 2. Stable cardiomegaly. Electronically Signed   By: Harrietta Sherry M.D.   On: 03/02/2024 14:03    EKG:  Personally reviewed, complete heart block with junctional escape rhythm narrow complex, QTc 531, T wave inversion laterally  ED Course:  At [facility] cardiology was consulted and discussed with EP recommending for evaluation for pacemaker placement for which she was transferred to [facility].   Assessment/Plan:  58 y.o. 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  Complete heart block, junctional escape rhythm Symptomatic bradycardia Reports subacute symptoms of exertional shortness of breath and chest pressure, lightheadedness x 3 weeks.  Acutely worsening in the past 24 hours with persistent bradycardia in the 40s on home pulse ox.  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  Labs notable for acute myocardial injury with flat hs troponin at 51. TSH wnl.  - At [facility] cardiology was consulted and discussed with EP recommending for evaluation for pacemaker placement for which she was transferred to [facility]. - Notified cardiology fellow Dr. Duffy of case, will consult with plan for EP evaluation for PPM.  - Maintain pacer pads in place  - TTE ordered - N.p.o. for possible pacemaker placement  Acute myocardial injury History of exertional chest pressure over the past 3 weeks.  EKG with T wave version laterally.  High-sensitivity troponin flat at 51.  Suspect demand event in the setting of her bradycardia  arrhythmia per above. - Management as recommended bradycardia arrhythmia - Check lipids and A1c  Increased anion gap - Check lactate  Hypokalemia, mild - Repleted  Chronic medical problems: ESRD on MWF HD: Last HD was 7/2.  Routine nephrology consult for HD in the morning (not contacted overnight).  Continue home sevelamer  when taking diet History of CVA: Noted, not on aspirin  or cholesterol-lowering therapy. Hypertension: Not on antihypertensives prior to admission Diabetes type 1.5: No longer on basal insulin .  Takes NovoLog  14 units 3 times daily with meals.  Ordered for aspart 10 units 3 times daily  with meals when taking p.o.  SSI for very sensitive and at bedtime correction. ?  Neuromuscular disorder: Details are unclear, no neurology notes that locate Anemia chronic kidney disease: check B12, folate with worsening macrocytosis, no recent checks Asthma: Without acute exacerbation, albuterol  prn   Body mass index is 29.49 kg/m.    DVT prophylaxis:  SCDs Code Status:  Full Code Diet:  Diet Orders (From admission, onward)     Start     Ordered   03/02/24 2356  Diet NPO time specified Except for: Sips with Meds  Diet effective now       Question:  Except for  Answer:  Noralyn with Meds   03/02/24 2357           Family Communication: Yes discussed with husband at bedside Consults:  Cardiology   Admission status:   Inpatient, Step Down Unit  Severity of Illness: The appropriate patient status for this patient is INPATIENT. Inpatient status is judged to be reasonable and necessary in order to provide the required intensity of service to ensure the patient's safety. The patient's presenting symptoms, physical exam findings, and initial radiographic and laboratory data in the context of their chronic comorbidities is felt to place them at high risk for further clinical deterioration. Furthermore, it is not anticipated that the patient will be medically stable for discharge from  the hospital within 2 midnights of admission.   * I certify that at the point of admission it is my clinical judgment that the patient will require inpatient hospital care spanning beyond 2 midnights from the point of admission due to high intensity of service, high risk for further deterioration and high frequency of surveillance required.*   Dorn Dawson, MD [facility]  How to contact the [facility] Attending or Consulting provider 7A - 7P or covering provider during after hours 7P -7A, for this patient.  Check the care team in [facility] and look for a) attending/consulting TR[facility] provider listed and b) the [facility] team listed Log into [email or web address] and use [place]'s universal password to access. If you do not have the password, please contact the hospital operator. Locate the [facility] provider you are looking for under [facility] and page to a number that you can be directly reached. If you still have difficulty reaching the provider, please page the [facility] (Director on Call) for the Hospitalists listed on amion for assistance.  03/03/2024, 12:31 AM

## 2024-03-03 NOTE — Progress Notes (Signed)
 Received patient in HD in unit .    Alert and oriented.   No distress noted  Informed consent obtained and verified in chart.    Patient completed a 3 hour session.  Patient tolerated well.    Access used Right internal jugular CVC without issues.    Total UF removed 1.5. No medications given.   Hand-off given to patient's nurse.  Transported back to the room   Bobetta Lango RN  [facility]

## 2024-03-03 NOTE — Plan of Care (Signed)

## 2024-03-03 NOTE — Progress Notes (Signed)
 PROGRESS NOTE    [name]  FMW:[id number] DOB: 11-03-1965 DOA: 03/02/2024 PCP: Pcp, No  Outpatient Specialists:     Brief Narrative:  Patient is a 58 year old female past medical history significant for diabetes mellitus, hypertension, nephrolithiasis, neuromuscular disorder, stroke, and anemia and end-stage renal disease on hemodialysis.  Patient does dialyze Monday, Wednesday and Friday.  Patient has right chest tunneled hemodialysis catheter.  Patient was admitted with symptomatic bradycardia secondary to complete heart block.  Electrophysiology team has been consulted.  Electrophysiology team plans to place leadless pacemaker next week.  03/03/2024: Patient seen.  Patient continues to report dizziness.  Echocardiogram is being performed.  No other complaints.  Nephrology team consulted.   Assessment & Plan:   Principal Problem:   Complete heart block (HCC) Active Problems:   Elevated troponin   Complete heart block, junctional escape rhythm: Symptomatic bradycardia: Reports subacute symptoms of exertional shortness of breath and chest pressure, lightheadedness x 3 weeks.  Acutely worsening in the past 24 hours with persistent bradycardia in the 40s on home pulse ox.  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  Labs notable for acute myocardial injury with flat hs troponin at 51. TSH wnl.  - At [facility] cardiology was consulted and discussed with EP recommending for evaluation for pacemaker placement for which she was transferred to [facility]. - Notified cardiology fellow Dr. Duffy of case, will consult with plan for EP evaluation for PPM.  - Maintain pacer pads in place  - TTE ordered - N.p.o. for possible pacemaker placement 03/03/2024: See above documentation.    Increased anion gap - Lactic acid of 1.7. - Patient has ESRD.   Hypokalemia, mild - Potassium of 3.2. - Continue to replete.   Chronic medical  problems: ESRD on MWF HD: Last HD was 7/2.  Routine nephrology consult for HD in the morning (not contacted overnight).  Continue home sevelamer  when taking diet History of CVA: Noted, not on aspirin  or cholesterol-lowering therapy. Hypertension: Not on antihypertensives prior to admission Diabetes type 1.5: No longer on basal insulin .  Takes NovoLog  14 units 3 times daily with meals.  Ordered for aspart 10 units 3 times daily with meals when taking p.o.  SSI for very sensitive and at bedtime correction. ?  Neuromuscular disorder: Details are unclear, no neurology notes that locate Anemia chronic kidney disease: check B12, folate with worsening macrocytosis, no recent checks Asthma: Without acute exacerbation, albuterol  prn       DVT prophylaxis: SCD. Code Status: Full code. Family Communication:  Disposition Plan: Patient is inpatient.   Consultants:  Electrophysiology team.  Procedures:  Echo For leadless pacemaker placement next week.  Antimicrobials:  None.   Subjective: Patient continues to report dizziness.  Objective: Vitals:   03/03/24 0031 03/03/24 0514 03/03/24 0718 03/03/24 1134  BP:  127/63 (!) 136/58 139/69  Pulse:  (!) 40 (!) 42 (!) 46  Resp:  18 18 18   Temp:  97.6 F (36.4 C) 97.6 F (36.4 C) 97.7 F (36.5 C)  TempSrc:  Oral Oral Oral  SpO2: 95% 94% 98% 96%  Weight:  85.3 kg    Height:       No intake or output data in the 24 hours ending 03/03/24 1201 Filed Weights   03/02/24 2226 03/03/24 0514  Weight: 85.4 kg 85.3 kg    Examination:  General exam: Appears calm and comfortable  Respiratory system: Clear to auscultation.  Cardiovascular system: S1 &  S2 heard, with systolic murmur  Gastrointestinal system: Abdomen is soft and nontender.   Central nervous system: Awake and alert.  Data Reviewed: I have personally reviewed following labs and imaging studies  CBC: Recent Labs  Lab 03/02/24 1310 03/03/24 0204  WBC 7.0 8.2  HGB 10.8*  10.7*  HCT 34.1* 33.5*  MCV 104.0* 104.0*  PLT 218 215   Basic Metabolic Panel: Recent Labs  Lab 03/02/24 1310 03/03/24 0204  NA 137 138  K 3.2* 3.2*  CL 93* 96*  CO2 24 25  GLUCOSE 339* 234*  BUN 32* 36*  CREATININE 6.78* 7.90*  CALCIUM  7.9* 8.0*  MG  --  2.0  PHOS  --  6.2*   GFR: Estimated Creatinine Clearance: 8.6 mL/min (A) (by C-G formula based on SCr of 7.9 mg/dL (H)). Liver Function Tests: No results for input(s): AST, ALT, ALKPHOS, BILITOT, PROT, ALBUMIN in the last 168 hours. No results for input(s): LIPASE, AMYLASE in the last 168 hours. No results for input(s): AMMONIA in the last 168 hours. Coagulation Profile: No results for input(s): INR, PROTIME in the last 168 hours. Cardiac Enzymes: No results for input(s): CKTOTAL, CKMB, CKMBINDEX, TROPONINI in the last 168 hours. BNP (last 3 results) No results for input(s): PROBNP in the last 8760 hours. HbA1C: Recent Labs    03/03/24 0204  HGBA1C 9.8*   CBG: Recent Labs  Lab 03/03/24 0027 03/03/24 0601 03/03/24 1133  GLUCAP 249* 201* 250*   Lipid Profile: Recent Labs    03/03/24 0204  CHOL 86  HDL 28*  LDLCALC 39  TRIG 96  CHOLHDL 3.1   Thyroid  Function Tests: Recent Labs    03/02/24 1310  TSH 2.406   Anemia Panel: No results for input(s): VITAMINB12, FOLATE, FERRITIN, TIBC, IRON , RETICCTPCT in the last 72 hours. Urine analysis: No results found for: COLORURINE, APPEARANCEUR, LABSPEC, PHURINE, GLUCOSEU, HGBUR, BILIRUBINUR, KETONESUR, PROTEINUR, UROBILINOGEN, NITRITE, LEUKOCYTESUR Sepsis Labs: @LABRCNTIP (procalcitonin:4,lacticidven:4)  )No results found for this or any previous visit (from the past 240 hours).       Radiology Studies: DG Chest Port 1 View Result Date: 03/02/2024 CLINICAL DATA:  Chest pain. EXAM: PORTABLE CHEST 1 VIEW COMPARISON:  02/20/2024. FINDINGS: Low lung volumes, similar to the prior exam.  Defibrillator pad overlies the left chest wall. Stable cardiomegaly. Right sided CVC catheter tip overlies the superior cavoatrial junction, unchanged. No focal consolidation, pleural effusion, or pneumothorax. No acute osseous abnormality. IMPRESSION: 1. Low lung volumes.  No acute cardiopulmonary findings. 2. Stable cardiomegaly. Electronically Signed   By: Harrietta Sherry M.D.   On: 03/02/2024 14:03        Scheduled Meds:  Chlorhexidine  Gluconate Cloth  6 each Topical Daily   insulin  aspart  0-5 Units Subcutaneous QHS   insulin  aspart  0-6 Units Subcutaneous TID WC   insulin  aspart  10 Units Subcutaneous TID WC   sevelamer  carbonate  1,600 mg Oral TID WC   sodium chloride  flush  3 mL Intravenous Q12H   Continuous Infusions:   LOS: 1 day    Time spent: 55 minutes.    Leatrice Chapel, MD  [facility] Pager #: [phone] 7PM-7AM contact night coverage as above

## 2024-03-03 NOTE — TOC CM/SW Note (Signed)
 Transition of Care [facility]) - Inpatient Brief Assessment   Patient Details  Name: [name] MRN: [id number] Date of Birth: 22-Jan-1966  Transition of Care [facility]) CM/SW Contact:    Waddell Barnie Rama, RN Phone Number: 03/03/2024, 3:33 PM   Clinical Narrative: From home with spouse, has no  PCP and insurance on file, states has no HH services in place at this time or DME at home.  States spouse will transport them home at [employer] and he is support system, states gets medications from [facility] on [address] in [place].  Pta self ambulatory.   Transition of Care Asessment: Insurance and Status: Insurance coverage has been reviewed Patient has primary care physician: Yes Home environment has been reviewed: lives with spouse Prior level of function:: indep Prior/Current Home Services: No current home services Social Drivers of Health Review: SDOH reviewed no interventions necessary Readmission risk has been reviewed: Yes Transition of care needs: no transition of care needs at this time

## 2024-03-03 NOTE — Progress Notes (Signed)
  Echocardiogram 2D Echocardiogram has been performed.  Koleen KANDICE Popper, RDCS 03/03/2024, 12:38 PM

## 2024-03-03 NOTE — Consult Note (Addendum)
 [facility] Renal Consultation Note    Indication for Consultation:  Management of ESRD/hemodialysis; anemia, hypertension/volume and secondary hyperparathyroidism Nephrologist: Dr. Marcelino CCKA  HPI: [name] is a 58 y.o. female with ESRD on hemodialysis MWF at [facility]. PMH: DM, HTN CVA. She transferred from [facility] this AM for work up of CHB.  Patient tells me she has had symptoms of low HR, SOB and dizziness for about 3 weeks. She says she called HD unit this AM and they advised her to go to hospital.  Seen in room. CHB visible on monitor rate in 40s. She is asymptomatic. She has no complaints. She has been seen by EP, plans for leadless pacer next week.  We have been asked to manage dialysis. No evidence of volume overload by exam. Denies SOB. Noted hypokalemia. Use added K+ bath. Give KCL 20 MEQ now. Recheck labs prior to HD.   Past Medical History:  Diagnosis Date   Anemia    vitamin d3 deficiency   Anxiety    Chronic kidney disease    End Stage Renal Disease   Diabetes mellitus without complication (HCC)    GERD (gastroesophageal reflux disease)    nothing over last few years   High serum parathyroid hormone (PTH)    checked through Dialysis   History of kidney stones 2000   Hypertension    Neuromuscular disorder (HCC)    neuropathy in feet   PONV (postoperative nausea and vomiting)    severe nausea requiring many doses of post op antiemetics   Stroke (HCC) 10/2017   thinks she had a series of mini strokes.right leg up to right side of face were numb. no loss of consciousness   Past Surgical History:  Procedure Laterality Date   ABDOMINAL HYSTERECTOMY  2007   APPLICATION OF WOUND VAC Left 11/15/2021   Procedure: APPLICATION OF WOUND VAC;  Surgeon: Krasinski, Kevin, MD;  Location: [facility] ORS;  Service: Orthopedics;  Laterality: Left;  Prevena 13cm    APPLICATION OF WOUND VAC Right 12/14/2021   Procedure: APPLICATION OF WOUND VAC;   Surgeon: Tobie Priest, MD;  Location: [facility] ORS;  Service: Orthopedics;  Laterality: Right;  [id number]   AV FISTULA INSERTION W/ RF MAGNETIC GUIDANCE N/A 12/08/2017   Procedure: AV FISTULA INSERTION W/RF MAGNETIC GUIDANCE;  Surgeon: Jama Cordella MATSU, MD;  Location: [facility] INVASIVE CV LAB;  Service: Cardiovascular;  Laterality: N/A;   DIALYSIS/PERMA CATHETER INSERTION Right 10/03/2021   Procedure: DIALYSIS/PERMA CATHETER INSERTION;  Surgeon: Jama Cordella MATSU, MD;  Location: [facility] INVASIVE CV LAB;  Service: Cardiovascular;  Laterality: Right;   HIP ARTHROPLASTY Left 09/29/2021   Procedure: ARTHROPLASTY BIPOLAR HIP (HEMIARTHROPLASTY);  Surgeon: Leora Lynwood SAUNDERS, MD;  Location: [facility] OR[facility];  Service: Orthopedics;  Laterality: Left;   HIP ARTHROPLASTY Right 12/14/2021   Procedure: ARTHROPLASTY BIPOLAR HIP (HEMIARTHROPLASTY);  Surgeon: Tobie Priest, MD;  Location: [facility] OR[facility];  Service: Orthopedics;  Laterality: Right;   INCISION AND DRAINAGE HIP Left 11/15/2021   Procedure: IRRIGATION AND DEBRIDEMENT LEFT HIP WOUND;  Surgeon: Marchia Drivers, MD;  Location: [facility] OR[facility];  Service: Orthopedics;  Laterality: Left;   QUADRICEPS TENDON REPAIR Right 10/16/2021   Procedure: REPAIR QUADRICEP TENDON;  Surgeon: Doll Skates, MD;  Location: [facility] OR;  Service: Orthopedics;  Laterality: Right;   ROTATOR CUFF REPAIR Right 2004   SHOULDER CLOSED REDUCTION Right 2004   UPPER EXTREMITY VENOGRAPHY Left 02/15/2018   Procedure: UPPER EXTREMITY VENOGRAPHY;  Surgeon: Jama Cordella MATSU, MD;  Location: [facility] INV[facility] CV LAB;  Service: Cardiovascular;  Laterality: Left;   Family History  Problem Relation Age of Onset   Emphysema Mother    COPD Mother    Cancer Father    Cancer Paternal Aunt    Diabetes Sister    Hypertension Sister    Eczema Sister    Diabetes Brother    Hypertension Brother    Cardiomyopathy Brother    Alcohol abuse Brother    Social History:  reports that she has never smoked. She has never used smokeless  tobacco. She reports that she does not drink alcohol and does not use drugs. Allergies  Allergen Reactions   Enalapril Hives and Other (See Comments)    Angioedema face.   Ivp Dye [Iodinated Contrast Media] Hives   Lisinopril Shortness Of Breath   Prior to Admission medications   Medication Sig Start Date End Date Taking? Authorizing Provider  albuterol  (VENTOLIN  HFA) 108 (90 Base) MCG/ACT inhaler Inhale 2 puffs into the lungs every 6 (six) hours as needed for wheezing or shortness of breath. 02/20/24  Yes [name], MD  NOVOLOG  FLEXPEN 100 UNIT/ML FlexPen Inject 14 Units into the skin 3 (three) times daily with meals.   Yes [provider]  sevelamer  carbonate (RENVELA ) 800 MG tablet Take 2,400 mg by mouth 3 (three) times daily with meals. 12/18/23  Yes [provider]  Vitamin D , Ergocalciferol , (DRISDOL ) 1.25 MG (50000 UNIT) CAPS capsule Take 1 capsule (50,000 Units total) by mouth every 7 (seven) days. Patient taking differently: Take 50,000 Units by mouth every 7 (seven) days. Monday 11/10/21  Yes Angiulli, Toribio PARAS, PA-C  azithromycin  (ZITHROMAX ) 250 MG tablet Take 1 tablet (250 mg total) by mouth daily. Patient not taking: Reported on 03/03/2024 02/20/24   [name], MD  cephALEXin  (KEFLEX ) 250 MG capsule Take 1 capsule (250 mg total) by mouth daily. Patient not taking: Reported on 03/03/2024 02/20/24   [name], MD   Current Facility-Administered Medications  Medication Dose Route Frequency Provider Last Rate Last Admin   acetaminophen  (TYLENOL ) tablet 1,000 mg  1,000 mg Oral Q6H PRN Segars, Jonathan, MD       albuterol  (PROVENTIL ) (2.5 MG/3ML) 0.083% nebulizer solution 2.5 mg  2.5 mg Nebulization Q4H PRN Segars, Dorn, MD       Chlorhexidine  Gluconate Cloth 2 % PADS 6 each  6 each Topical Daily Segars, Dorn, MD   6 each at 03/03/24 1100   [START ON 03/04/2024] Chlorhexidine  Gluconate Cloth 2 % PADS 6 each  6 each Topical Q0600 Delores Ricka Maidens, NP       insulin  aspart (novoLOG ) injection 0-5 Units  0-5 Units Subcutaneous QHS Segars, Jonathan, MD   2 Units at 03/03/24 0039   insulin  aspart (novoLOG ) injection 0-6 Units  0-6 Units Subcutaneous TID WC Segars, Jonathan, MD   2 Units at 03/03/24 1309   insulin  aspart (novoLOG ) injection 10 Units  10 Units Subcutaneous TID WC Segars, Jonathan, MD   10 Units at 03/03/24 1309   melatonin tablet 6 mg  6 mg Oral QHS PRN Segars, Jonathan, MD       polyethylene glycol (MIRALAX  / GLYCOLAX ) packet 17 g  17 g Oral Daily PRN Segars, Jonathan, MD       sevelamer  carbonate (RENVELA ) tablet 1,600 mg  1,600 mg Oral TID WC Segars, Jonathan, MD   1,600 mg at 03/03/24 1308   sodium chloride  flush (NS) 0.9 % injection 3 mL  3 mL Intravenous Q12H Segars, Jonathan, MD   3 mL  at 03/03/24 0903   Labs: Basic Metabolic Panel: Recent Labs  Lab 03/02/24 1310 03/03/24 0204  NA 137 138  K 3.2* 3.2*  CL 93* 96*  CO2 24 25  GLUCOSE 339* 234*  BUN 32* 36*  CREATININE 6.78* 7.90*  CALCIUM  7.9* 8.0*  PHOS  --  6.2*   Liver Function Tests: No results for input(s): AST, ALT, ALKPHOS, BILITOT, PROT, ALBUMIN in the last 168 hours. No results for input(s): LIPASE, AMYLASE in the last 168 hours. No results for input(s): AMMONIA in the last 168 hours. CBC: Recent Labs  Lab 03/02/24 1310 03/03/24 0204  WBC 7.0 8.2  HGB 10.8* 10.7*  HCT 34.1* 33.5*  MCV 104.0* 104.0*  PLT 218 215   Cardiac Enzymes: No results for input(s): CKTOTAL, CKMB, CKMBINDEX, TROPONINI in the last 168 hours. CBG: Recent Labs  Lab 03/03/24 0027 03/03/24 0601 03/03/24 1133  GLUCAP 249* 201* 250*   Iron  Studies: No results for input(s): IRON , TIBC, TRANSFERRIN, FERRITIN in the last 72 hours. Studies/Results: ECHOCARDIOGRAM COMPLETE Result Date: 03/03/2024    ECHOCARDIOGRAM REPORT   Patient Name:   [name] Date of Exam: 03/03/2024 Medical Rec #:  [id number]    Height:       67.0 in  Accession #:    [id number]   Weight:       188.1 lb Date of Birth:  1966-06-08    BSA:          1.970 m Patient Age:    57 years     BP:           139/69 mmHg Patient Gender: F            HR:           49 bpm. Exam Location:  Inpatient Procedure: 2D Echo, Strain Analysis, Cardiac Doppler and Color Doppler (Both            Spectral and Color Flow Doppler were utilized during procedure). 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                 Arrythmias:Bradycardia, Signs/Symptoms:Shortness of Breath; Risk                 Factors:Hypertension, Diabetes and Dyslipidemia.  Sonographer:    Koleen Popper RDCS Referring Phys: [id number] JONATHAN SEGARS  Sonographer Comments: Global longitudinal strain was attempted. IMPRESSIONS  1. Left ventricular ejection fraction, by estimation, is 40 to 45%. The left ventricle has mildly decreased function. The left ventricle demonstrates regional wall motion abnormalities (see scoring diagram/findings for description). The left ventricular  internal cavity size was moderately dilated. Left ventricular diastolic function could not be evaluated. Elevated left atrial pressure. The E/e' is 17.  2. Right ventricular systolic function is low normal. The right ventricular size is normal. increased. A catheter within the right atrium is visualized. There is severely elevated pulmonary artery systolic pressure.  3. The mitral valve is degenerative. Severe mitral valve regurgitation. Mild mitral valve stenosis (MG , MVA (VTI) 1.07cm2, HR 46bpm, RVSP ). Accuracy limited due to complete heart block. mitral stenosis. Severe mitral annular calcification.  4. Tricuspid valve regurgitation is severe.  5. The aortic valve is normal in structure. Aortic valve regurgitation is not visualized. Aortic valve sclerosis with mild stenosis (peak velocity 2.44m/s, MG , AVA VTI 1.33cm2, DI 0.47). LCC is not very  mobile.  6.  Pulmonic valve regurgitation is moderate.  7. The inferior vena cava is dilated in size with <50% respiratory variability, suggesting right atrial pressure of 15 mmHg. Comparison(s): No prior Echocardiogram. FINDINGS  Left Ventricle: Left ventricular ejection fraction, by estimation, is 40 to 45%. The left ventricle has mildly decreased function. The left ventricle demonstrates regional wall motion abnormalities. Global longitudinal strain performed but not reported based on interpreter judgement due to suboptimal tracking. The left ventricular internal cavity size was moderately dilated. There is no left ventricular hypertrophy. Left ventricular diastolic function could not be evaluated due to mitral annular calcification (moderate or greater). Left ventricular diastolic function could not be evaluated. Elevated left atrial pressure. The E/e' is 59.  LV Wall Scoring: The entire septum and mid inferior segment are hypokinetic. Right Ventricle: The right ventricular size is normal. Increased. Right ventricular systolic function is low normal. There is severely elevated pulmonary artery systolic pressure. The tricuspid regurgitant velocity is 3.63 m/s, and with an assumed right atrial pressure of 15 mmHg, the estimated right ventricular systolic pressure is 67.7 mmHg. Left Atrium: Left atrial size was normal in size. Right Atrium: Right atrial size was normal in size. Pericardium: There is no evidence of pericardial effusion. Mitral Valve: The mitral valve is degenerative in appearance. There is mild thickening of the mitral valve leaflet(s). There is mild calcification of the mitral valve leaflet(s). Mildly decreased mobility of the mitral valve leaflets. Severe mitral annular calcification. Severe mitral valve regurgitation. Mild mitral valve stenosis (MG , MVA (VTI) 1.07cm2, HR 46bpm, RVSP ). Accuracy limited due to complete heart block. mitral valve stenosis. MV peak gradient, 11.7 mmHg. The  mean mitral valve gradient is 3.0 mmHg. Tricuspid Valve: The tricuspid valve is grossly normal. Tricuspid valve regurgitation is severe. No evidence of tricuspid stenosis. The flow in the hepatic veins is reversed during ventricular systole. Aortic Valve: The aortic valve is normal in structure. Aortic valve regurgitation is not visualized. Aortic valve sclerosis with mild stenosis (peak velocity 2.80m/s, MG , AVA VTI 1.33cm2, DI 0.47). LCC is not very mobile. Aortic valve mean gradient  measures 10.5 mmHg. Aortic valve peak gradient measures 27.8 mmHg. Aortic valve area, by VTI measures 1.33 cm. Pulmonic Valve: The pulmonic valve was grossly normal. Pulmonic valve regurgitation is moderate. No evidence of pulmonic stenosis. Aorta: The aortic root and ascending aorta are structurally normal, with no evidence of dilitation. Venous: The inferior vena cava is dilated in size with less than 50% respiratory variability, suggesting right atrial pressure of 15 mmHg. IAS/Shunts: The atrial septum is grossly normal. Additional Comments: A catheter within the right atrium is visualized.  LEFT VENTRICLE PLAX 2D LVIDd:         5.80 cm   Diastology LVIDs:         4.40 cm   LV e' medial:    7.83 cm/s LV PW:         1.00 cm   LV E/e' medial:  18.4 LV IVS:        0.90 cm   LV e' lateral:   8.81 cm/s LVOT diam:     1.90 cm   LV E/e' lateral: 16.3 LV SV:         57 LV SV Index:   29 LVOT Area:     2.84 cm  RIGHT VENTRICLE            IVC RV Basal diam:  4.30 cm    IVC diam: 2.20 cm RV Mid diam:  3.90 cm RV S prime:     8.70 cm/s TAPSE (M-mode): 1.8 cm LEFT ATRIUM             Index        RIGHT ATRIUM           Index LA diam:        5.20 cm 2.64 cm/m   RA Area:     17.70 cm LA Vol (A2C):   61.0 ml 30.96 ml/m  RA Volume:   50.20 ml  25.48 ml/m LA Vol (A4C):   61.5 ml 31.22 ml/m LA Biplane Vol: 61.3 ml 31.12 ml/m  AORTIC VALVE AV Area (Vmax):    1.26 cm AV Area (Vmean):   1.28 cm AV Area (VTI):     1.33 cm AV Vmax:            263.75 cm/s AV Vmean:          148.000 cm/s AV VTI:            0.428 m AV Peak Grad:      27.8 mmHg AV Mean Grad:      10.5 mmHg LVOT Vmax:         117.00 cm/s LVOT Vmean:        66.900 cm/s LVOT VTI:          0.201 m LVOT/AV VTI ratio: 0.47  AORTA Ao Root diam: 2.90 cm Ao Asc diam:  3.20 cm MITRAL VALVE                TRICUSPID VALVE MV Area (PHT): 2.95 cm     TR Peak grad:   52.7 mmHg MV Area VTI:   1.07 cm     TR Vmax:        363.00 cm/s MV Peak grad:  11.7 mmHg MV Mean grad:  3.0 mmHg     SHUNTS MV Vmax:       1.71 m/s     Systemic VTI:  0.20 m MV Vmean:      70.1 cm/s    Systemic Diam: 1.90 cm MV Decel Time: 257 msec MV E velocity: 144.00 cm/s MV A velocity: 82.60 cm/s MV E/A ratio:  1.74 Sunit Tolia Electronically signed by Madonna Large Signature Date/Time: 03/03/2024/1:50:34 PM    Final    DG Chest Port 1 View Result Date: 03/02/2024 CLINICAL DATA:  Chest pain. EXAM: PORTABLE CHEST 1 VIEW COMPARISON:  02/20/2024. FINDINGS: Low lung volumes, similar to the prior exam. Defibrillator pad overlies the left chest wall. Stable cardiomegaly. Right sided CVC catheter tip overlies the superior cavoatrial junction, unchanged. No focal consolidation, pleural effusion, or pneumothorax. No acute osseous abnormality. IMPRESSION: 1. Low lung volumes.  No acute cardiopulmonary findings. 2. Stable cardiomegaly. Electronically Signed   By: Harrietta Sherry M.D.   On: 03/02/2024 14:03    ROS: As per HPI otherwise negative.  Review of Systems: Gen: Denies any fever, chills, sweats, anorexia, fatigue, weakness, malaise, weight loss, and sleep disorder HEENT: No visual complaints, No history of Retinopathy. Normal external appearance No Epistaxis or Sore throat. No sinusitis.   CV: Denies chest pain, angina, palpitations, syncope, orthopnea, PND, peripheral edema, and claudication. Resp: Denies dyspnea at rest, dyspnea with exercise, cough, sputum, wheezing, coughing up blood, and pleurisy. GI: Denies vomiting  blood, jaundice, and fecal incontinence.   Denies dysphagia or odynophagia. GU : Denies urinary burning, blood in urine, urinary frequency, urinary hesitancy, nocturnal urination, and urinary incontinence.  No renal calculi. MS:  Denies joint pain, limitation of movement, and swelling, stiffness, low back pain, extremity pain. Denies muscle weakness, cramps, atrophy.  No use of non steroidal antiinflammatory drugs. Derm: Denies rash, itching, dry skin, hives, moles, warts, or unhealing ulcers.  Psych: Denies depression, anxiety, memory loss, suicidal ideation, hallucinations, paranoia, and confusion. Heme: Denies bruising, bleeding, and enlarged lymph nodes. Neuro: No headache.  No diplopia. No dysarthria.  No dysphasia.  No history of CVA.  No Seizures. No paresthesias.  No weakness. Endocrine No DM.  No Thyroid  disease.  No Adrenal disease.  Physical Exam: Vitals:   03/03/24 0031 03/03/24 0514 03/03/24 0718 03/03/24 1134  BP:  127/63 (!) 136/58 139/69  Pulse:  (!) 40 (!) 42 (!) 46  Resp:  18 18 18   Temp:  97.6 F (36.4 C) 97.6 F (36.4 C) 97.7 F (36.5 C)  TempSrc:  Oral Oral Oral  SpO2: 95% 94% 98% 96%  Weight:  85.3 kg    Height:         General: pleasant female in NAD Head: Normocephalic, atraumatic, sclera non-icteric, mucus membranes are moist Neck: Supple. JVD not elevated. Lungs:CTAB A/P Heart: S1,S2 No M/R/G irregular brady-rate 40s Abdomen: NABS, NT, ND Lower extremities:No LE edema Neuro: Alert and oriented X 3. Moves all extremities spontaneously. Psych:  Responds to questions appropriately with a normal affect. Dialysis Access: RIJ TDC drsg intact  Dialysis Orders: Center: [facility] [address]  MWF . Attempted to request records-unit is closed. Will try again tomorrow.   Assessment/Plan:  CHB-work up per EP/primary. Plans for leadless PPM next week.  Hypokalemia-K+ 3.2. given 20 MEQ KCL early this AM. Give additional 40 MEQ PO now.  Recheck labs prior to HD. Added  K+ bath ordered.   ESRD -  MWF HD today and again 03/06/2024  Hypertension/volume  - BP and volume fairly well controlled. No antihypertensive medications on home med list  Anemia  - HGB 10.7. Follow HGB.   Metabolic bone disease -  PO4 6.2 Corrected calcium  8.3. Continue sevelamer  binders.   Nutrition - Albumin acceptable. Renal/Carb mod diet.Nepro.  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 Delores, NP-C 03/03/2024, 2:43 PM  [employer] [id number]

## 2024-03-03 NOTE — Consult Note (Signed)
 Cardiology Consultation   Patient ID: [name] MRN: [id number]; DOB: December 05, 1965  Admit date: 03/02/2024 Date of Consult: 03/03/2024  PCP:  Freddrick, No   [facility] Providers Cardiologist:  None        History of Present Illness: [name] is a 58 y.o. female with a hx of ESRD on MWF HD, prior CVA, hypertension, diabetes type, ?neuromuscular disorder, anemia chronic kidney disease, asthma  who is being seen 03/03/2024 for the evaluation of complete heart block at the request of Dr. Keturah.  Patient reports 3 weeks of worsening shortness of breath and intermittent dizziness. Dizziness and lightheadedness seemed to correlate with low heart rates. She informed her dialysis center about her low heart rates and they advised her to go to the ED. Was found to have CHB on ECG in [facility] ED. Transferred to [facility]. Today, reports feeling okay at rest. No new or acute complaints.  Past Medical History:  Diagnosis Date   Anemia    vitamin d3 deficiency   Anxiety    Chronic kidney disease    End Stage Renal Disease   Diabetes mellitus without complication (HCC)    GERD (gastroesophageal reflux disease)    nothing over last few years   High serum parathyroid hormone (PTH)    checked through Dialysis   History of kidney stones 2000   Hypertension    Neuromuscular disorder (HCC)    neuropathy in feet   PONV (postoperative nausea and vomiting)    severe nausea requiring many doses of post op antiemetics   Stroke (HCC) 10/2017   thinks she had a series of mini strokes.right leg up to right side of face were numb. no loss of consciousness    Past Surgical History:  Procedure Laterality Date   ABDOMINAL HYSTERECTOMY  2007   APPLICATION OF WOUND VAC Left 11/15/2021   Procedure: APPLICATION OF WOUND VAC;  Surgeon: Krasinski, Kevin, MD;  Location: [facility] ORS;  Service: Orthopedics;  Laterality: Left;  Prevena 13cm    APPLICATION OF WOUND VAC Right 12/14/2021   Procedure: APPLICATION OF  WOUND VAC;  Surgeon: Tobie Priest, MD;  Location: [facility] ORS;  Service: Orthopedics;  Laterality: Right;  [id number]   AV FISTULA INSERTION W/ RF MAGNETIC GUIDANCE N/A 12/08/2017   Procedure: AV FISTULA INSERTION W/RF MAGNETIC GUIDANCE;  Surgeon: Jama Cordella MATSU, MD;  Location: [facility] INVASIVE CV LAB;  Service: Cardiovascular;  Laterality: N/A;   DIALYSIS/PERMA CATHETER INSERTION Right 10/03/2021   Procedure: DIALYSIS/PERMA CATHETER INSERTION;  Surgeon: Jama Cordella MATSU, MD;  Location: [facility] INVASIVE CV LAB;  Service: Cardiovascular;  Laterality: Right;   HIP ARTHROPLASTY Left 09/29/2021   Procedure: ARTHROPLASTY BIPOLAR HIP (HEMIARTHROPLASTY);  Surgeon: Leora Lynwood SAUNDERS, MD;  Location: [facility] ORS;  Service: Orthopedics;  Laterality: Left;   HIP ARTHROPLASTY Right 12/14/2021   Procedure: ARTHROPLASTY BIPOLAR HIP (HEMIARTHROPLASTY);  Surgeon: Tobie Priest, MD;  Location: [facility] ORS;  Service: Orthopedics;  Laterality: Right;   INCISION AND DRAINAGE HIP Left 11/15/2021   Procedure: IRRIGATION AND DEBRIDEMENT LEFT HIP WOUND;  Surgeon: Marchia Drivers, MD;  Location: [facility] ORS;  Service: Orthopedics;  Laterality: Left;   QUADRICEPS TENDON REPAIR Right 10/16/2021   Procedure: REPAIR QUADRICEP TENDON;  Surgeon: Doll Skates, MD;  Location: [facility] OR;  Service: Orthopedics;  Laterality: Right;   ROTATOR CUFF REPAIR Right 2004   SHOULDER CLOSED REDUCTION Right 2004   UPPER EXTREMITY VENOGRAPHY Left 02/15/2018   Procedure: UPPER EXTREMITY VENOGRAPHY;  Surgeon: Jama Cordella MATSU, MD;  Location: [facility] INVASIVE  CV LAB;  Service: Cardiovascular;  Laterality: Left;     Scheduled Meds:  Chlorhexidine  Gluconate Cloth  6 each Topical Daily   insulin  aspart  0-5 Units Subcutaneous QHS   insulin  aspart  0-6 Units Subcutaneous TID WC   insulin  aspart  10 Units Subcutaneous TID WC   sevelamer  carbonate  1,600 mg Oral TID WC   sodium chloride  flush  3 mL Intravenous Q12H   Continuous Infusions:  PRN Meds: acetaminophen ,  albuterol , melatonin, polyethylene glycol  Allergies:    Allergies  Allergen Reactions   Enalapril Hives and Other (See Comments)    Angioedema face.   Ivp Dye [Iodinated Contrast Media] Hives   Lisinopril Shortness Of Breath    Social History:   Social History   Socioeconomic History   Marital status: Married    Spouse name: Redell   Number of children: 0   Years of education: Not on file   Highest education level: Some college, no degree  Occupational History   Occupation: Scientist, water quality  Tobacco Use   Smoking status: Never   Smokeless tobacco: Never  Vaping Use   Vaping status: Never Used  Substance and Sexual Activity   Alcohol use: No   Drug use: Never   Sexual activity: Yes    Partners: Male  Other Topics Concern   Not on file  Social History Narrative   Not on file   Social Drivers of Health   Financial Resource Strain: Low Risk  (03/07/2018)   Overall Financial Resource Strain (CARDIA)    Difficulty of Paying Living Expenses: Not hard at all  Food Insecurity: No Food Insecurity (03/02/2024)   Hunger Vital Sign    Worried About Running Out of Food in the Last Year: Never true    Ran Out of Food in the Last Year: Never true  Transportation Needs: No Transportation Needs (03/02/2024)   PRAPARE - Administrator, Civil Service (Medical): No    Lack of Transportation (Non-Medical): No  Physical Activity: Unknown (01/03/2019)   Exercise Vital Sign    Days of Exercise per Week: 3 days    Minutes of Exercise per Session: Not on file  Stress: No Stress Concern Present (03/07/2018)   [employer] of Occupational Health - Occupational Stress Questionnaire    Feeling of Stress : Not at all  Social Connections: Moderately Integrated (03/07/2018)   Social Connection and Isolation Panel    Frequency of Communication with Friends and Family: More than three times a week    Frequency of Social Gatherings with Friends and Family: More than three times a  week    Attends Religious Services: More than 4 times per year    Active Member of [employer] or Organizations: No    Attends Banker Meetings: Never    Marital Status: Married  Catering manager Violence: Not At Risk (03/02/2024)   Humiliation, Afraid, Rape, and Kick questionnaire    Fear of Current or Ex-Partner: No    Emotionally Abused: No    Physically Abused: No    Sexually Abused: No    Family History:   Family History  Problem Relation Age of Onset   Emphysema Mother    COPD Mother    Cancer Father    Cancer Paternal Aunt    Diabetes Sister    Hypertension Sister    Eczema Sister    Diabetes Brother    Hypertension Brother    Cardiomyopathy Brother    Alcohol abuse  Brother      ROS:  Please see the history of present illness.   All other ROS reviewed and negative.     Physical Exam/Data: Vitals:   03/03/24 0030 03/03/24 0031 03/03/24 0514 03/03/24 0718  BP: 134/63  127/63 (!) 136/58  Pulse: (!) 39  (!) 40 (!) 42  Resp: 18  18 18   Temp: 97.7 F (36.5 C)  97.6 F (36.4 C) 97.6 F (36.4 C)  TempSrc: Oral  Oral Oral  SpO2:  95% 94% 98%  Weight:   85.3 kg   Height:       No intake or output data in the 24 hours ending 03/03/24 0842    03/03/2024    5:14 AM 03/02/2024   10:26 PM 02/20/2024    8:15 PM  Last 3 Weights  Weight (lbs) 188 lb 0.8 oz 188 lb 4.4 oz 184 lb  Weight (kg) 85.3 kg 85.4 kg 83.462 kg     Body mass index is 29.45 kg/m.   General: Well developed, in no acute distress.  Neck: No JVD.  Chest: R sided tunneled dialysis catheter. Cardiac: Bradycardic, regular rhythm.  Resp: Normal work of breathing.  Ext: No edema.  Psych: Normal affect.   ECGs reviewed: Sinus rhythm with CHB and junctional escape.   Laboratory Data: High Sensitivity Troponin:   Recent Labs  Lab 03/02/24 1310 03/02/24 1455  TROPONINIHS 51* 51*     Chemistry Recent Labs  Lab 03/02/24 1310 03/03/24 0204  NA 137 138  K 3.2* 3.2*  CL 93* 96*  CO2 24 25   GLUCOSE 339* 234*  BUN 32* 36*  CREATININE 6.78* 7.90*  CALCIUM  7.9* 8.0*  MG  --  2.0  GFRNONAA 7* 5*  ANIONGAP 20* 17*    No results for input(s): PROT, ALBUMIN, AST, ALT, ALKPHOS, BILITOT in the last 168 hours. Lipids  Recent Labs  Lab 03/03/24 0204  CHOL 86  TRIG 96  HDL 28*  LDLCALC 39  CHOLHDL 3.1    Hematology Recent Labs  Lab 03/02/24 1310 03/03/24 0204  WBC 7.0 8.2  RBC 3.28* 3.22*  HGB 10.8* 10.7*  HCT 34.1* 33.5*  MCV 104.0* 104.0*  MCH 32.9 33.2  MCHC 31.7 31.9  RDW 14.8 15.1  PLT 218 215   Thyroid   Recent Labs  Lab 03/02/24 1310  TSH 2.406    BNPNo results for input(s): BNP, PROBNP in the last 168 hours.  DDimer No results for input(s): DDIMER in the last 168 hours.  Radiology/Studies:  DG Chest Port 1 View Result Date: 03/02/2024 CLINICAL DATA:  Chest pain. EXAM: PORTABLE CHEST 1 VIEW COMPARISON:  02/20/2024. FINDINGS: Low lung volumes, similar to the prior exam. Defibrillator pad overlies the left chest wall. Stable cardiomegaly. Right sided CVC catheter tip overlies the superior cavoatrial junction, unchanged. No focal consolidation, pleural effusion, or pneumothorax. No acute osseous abnormality. IMPRESSION: 1. Low lung volumes.  No acute cardiopulmonary findings. 2. Stable cardiomegaly. Electronically Signed   By: Harrietta Sherry M.D.   On: 03/02/2024 14:03     Assessment and Plan: #. Complete heart block: No reversible cause has been identified. Sinus rates in the 90s. #. Symptomatic bradycardia: - Echocardiogram ordered and is pending.  - Prior LUE fistula. Right sided tunneled dialysis catheter. Will need leadless pacer. Tentatively implant sometime next week. Will need general anesthesia. Micra AV vs Aveir dual chamber.  #. ESRD:  - Continue frequent dialysis to manage electrolytes and volume status. Appreciate nephrology assistance.  Signed,  Fonda Kitty, MD  03/03/2024 8:42 AM

## 2024-03-03 NOTE — Consult Note (Signed)
 Cardiology Consult Note    Patient ID: [name] MRN: [id number]; DOB: Apr 21, 1966   Admission date: 03/02/2024  Primary Care Provider: Pcp, No Primary Cardiologist: None  Primary Electrophysiologist:  None   Chief Complaint:  Bradycardia  Patient Profile:   [name] is a 58 y.o. female with ESRD on HD, hyperparathyroidism, diabetes mellitus  History of Present Illness:   [name] presents to the ED today after noticing decreased heart rates at home. She states that for the last 3 weeks she has been experiencing shortness of breath that was initially attributed to pneumonia and asthma. However, she has not gotten better despite two different antibiotic treatments. She states that two days ago she noticed that her HR was in the 40s. She states that it has not come up out of the 40s for the last 24 hours. She called her HD center and they recommended she present to the hospital.   She denies chest pain or discomfort. She has noticed that she is easily winded. Denies fevers or chills.   Past Medical History:  Diagnosis Date   Anemia    vitamin d3 deficiency   Anxiety    Chronic kidney disease    End Stage Renal Disease   Diabetes mellitus without complication (HCC)    GERD (gastroesophageal reflux disease)    nothing over last few years   High serum parathyroid hormone (PTH)    checked through Dialysis   History of kidney stones 2000   Hypertension    Neuromuscular disorder (HCC)    neuropathy in feet   PONV (postoperative nausea and vomiting)    severe nausea requiring many doses of post op antiemetics   Stroke (HCC) 10/2017   thinks she had a series of mini strokes.right leg up to right side of face were numb. no loss of consciousness    Past Surgical History:  Procedure Laterality Date   ABDOMINAL HYSTERECTOMY  2007   APPLICATION OF WOUND VAC Left 11/15/2021   Procedure: APPLICATION OF WOUND VAC;  Surgeon: Krasinski, Kevin, MD;  Location: [facility] ORS;  Service:  Orthopedics;  Laterality: Left;  Prevena 13cm    APPLICATION OF WOUND VAC Right 12/14/2021   Procedure: APPLICATION OF WOUND VAC;  Surgeon: Tobie Priest, MD;  Location: [facility] ORS;  Service: Orthopedics;  Laterality: Right;  [id number]   AV FISTULA INSERTION W/ RF MAGNETIC GUIDANCE N/A 12/08/2017   Procedure: AV FISTULA INSERTION W/RF MAGNETIC GUIDANCE;  Surgeon: Jama Cordella MATSU, MD;  Location: [facility] INVASIVE CV LAB;  Service: Cardiovascular;  Laterality: N/A;   DIALYSIS/PERMA CATHETER INSERTION Right 10/03/2021   Procedure: DIALYSIS/PERMA CATHETER INSERTION;  Surgeon: Jama Cordella MATSU, MD;  Location: [facility] INVASIVE CV LAB;  Service: Cardiovascular;  Laterality: Right;   HIP ARTHROPLASTY Left 09/29/2021   Procedure: ARTHROPLASTY BIPOLAR HIP (HEMIARTHROPLASTY);  Surgeon: Leora Lynwood SAUNDERS, MD;  Location: [facility] ORS;  Service: Orthopedics;  Laterality: Left;   HIP ARTHROPLASTY Right 12/14/2021   Procedure: ARTHROPLASTY BIPOLAR HIP (HEMIARTHROPLASTY);  Surgeon: Tobie Priest, MD;  Location: [facility] ORS;  Service: Orthopedics;  Laterality: Right;   INCISION AND DRAINAGE HIP Left 11/15/2021   Procedure: IRRIGATION AND DEBRIDEMENT LEFT HIP WOUND;  Surgeon: Marchia Drivers, MD;  Location: [facility] ORS;  Service: Orthopedics;  Laterality: Left;   QUADRICEPS TENDON REPAIR Right 10/16/2021   Procedure: REPAIR QUADRICEP TENDON;  Surgeon: Doll Skates, MD;  Location: [facility] OR;  Service: Orthopedics;  Laterality: Right;   ROTATOR CUFF REPAIR Right 2004   SHOULDER CLOSED REDUCTION Right 2004  UPPER EXTREMITY VENOGRAPHY Left 02/15/2018   Procedure: UPPER EXTREMITY VENOGRAPHY;  Surgeon: Jama Cordella MATSU, MD;  Location: [facility] INVASIVE CV LAB;  Service: Cardiovascular;  Laterality: Left;     Medications Prior to Admission: Prior to Admission medications   Medication Sig Start Date End Date Taking? Authorizing Provider  acetaminophen  (TYLENOL ) 325 MG tablet Take 2 tablets (650 mg total) by mouth 3 (three) times daily. 11/07/21    Angiulli, Toribio PARAS, PA-C  albuterol  (VENTOLIN  HFA) 108 (90 Base) MCG/ACT inhaler Inhale 2 puffs into the lungs every 6 (six) hours as needed for wheezing or shortness of breath. 02/20/24   [name], MD  azithromycin  (ZITHROMAX ) 250 MG tablet Take 1 tablet (250 mg total) by mouth daily. 02/20/24   [name], MD  cephALEXin  (KEFLEX ) 250 MG capsule Take 1 capsule (250 mg total) by mouth daily. 02/20/24   [name], MD  docusate sodium  (COLACE) 100 MG capsule Take 1 capsule (100 mg total) by mouth 2 (two) times daily. 12/18/21   Patel, Sona, MD  heparin  5000 UNIT/ML injection Inject 1 mL (5,000 Units total) into the skin every 8 (eight) hours for 14 days. 12/18/21 01/01/22  Patel, Sona, MD  insulin  glargine-yfgn (SEMGLEE , YFGN,) 100 UNIT/ML Pen Inject 12 Units into the skin daily. 12/18/21   Patel, Sona, MD  losartan  (COZAAR ) 50 MG tablet Take 50 mg by mouth daily. 12/03/21   [provider]  ondansetron  (ZOFRAN ) 4 MG tablet Take 1 tablet (4 mg total) by mouth every 6 (six) hours as needed for nausea. 12/15/21   Verlinda Boas, PA-C  polyethylene glycol (MIRALAX  / GLYCOLAX ) 17 g packet Take 17 g by mouth daily. 12/19/21   Patel, Sona, MD  Vitamin D , Ergocalciferol , (DRISDOL ) 1.25 MG (50000 UNIT) CAPS capsule Take 1 capsule (50,000 Units total) by mouth every 7 (seven) days. 11/10/21   Angiulli, Toribio PARAS, PA-C     Allergies:    Allergies  Allergen Reactions   Enalapril Hives and Other (See Comments)    Angioedema face.   Ivp Dye [Iodinated Contrast Media] Hives   Lisinopril Shortness Of Breath    Social History:   Social History   Socioeconomic History   Marital status: Married    Spouse name: Redell   Number of children: 0   Years of education: Not on file   Highest education level: Some college, no degree  Occupational History   Occupation: Scientist, water quality  Tobacco Use   Smoking status: Never   Smokeless tobacco: Never  Vaping Use   Vaping status: Never  Used  Substance and Sexual Activity   Alcohol use: No   Drug use: Never   Sexual activity: Yes    Partners: Male  Other Topics Concern   Not on file  Social History Narrative   Not on file   Social Drivers of Health   Financial Resource Strain: Low Risk  (03/07/2018)   Overall Financial Resource Strain (CARDIA)    Difficulty of Paying Living Expenses: Not hard at all  Food Insecurity: No Food Insecurity (03/02/2024)   Hunger Vital Sign    Worried About Running Out of Food in the Last Year: Never true    Ran Out of Food in the Last Year: Never true  Transportation Needs: No Transportation Needs (03/02/2024)   PRAPARE - Administrator, Civil Service (Medical): No    Lack of Transportation (Non-Medical): No  Physical Activity: Unknown (01/03/2019)   Exercise Vital Sign    Days  of Exercise per Week: 3 days    Minutes of Exercise per Session: Not on file  Stress: No Stress Concern Present (03/07/2018)   [employer] of Occupational Health - Occupational Stress Questionnaire    Feeling of Stress : Not at all  Social Connections: Moderately Integrated (03/07/2018)   Social Connection and Isolation Panel    Frequency of Communication with Friends and Family: More than three times a week    Frequency of Social Gatherings with Friends and Family: More than three times a week    Attends Religious Services: More than 4 times per year    Active Member of [employer] or Organizations: No    Attends Banker Meetings: Never    Marital Status: Married  Catering manager Violence: Not At Risk (03/02/2024)   Humiliation, Afraid, Rape, and Kick questionnaire    Fear of Current or Ex-Partner: No    Emotionally Abused: No    Physically Abused: No    Sexually Abused: No    Family History:   The patient's family history includes Alcohol abuse in her brother; COPD in her mother; Cancer in her father and paternal aunt; Cardiomyopathy in her brother; Diabetes in her brother and sister;  Eczema in her sister; Emphysema in her mother; Hypertension in her brother and sister.    Review of Systems: [y] = yes, [ ]  = no    General: Weight gain [ ] ; Weight loss [ ] ; Anorexia [ ] ; Fatigue [ ] ; Fever [ ] ; Chills [ ] ; Weakness [ ]   Cardiac: Chest pain/pressure [ ] ; Resting SOB [ ] ; Exertional SOB [ ] ; Orthopnea [ ] ; Pedal Edema [ ] ; Palpitations [ ] ; Syncope [ ] ; Presyncope [ ] ; Paroxysmal nocturnal dyspnea[ ]   Pulmonary: Cough [ ] ; Wheezing[ ] ; Hemoptysis[ ] ; Sputum [ ] ; Snoring [ ]   GI: Vomiting[ ] ; Dysphagia[ ] ; Melena[ ] ; Hematochezia [ ] ; Heartburn[ ] ; Abdominal pain [ ] ; Constipation [ ] ; Diarrhea [ ] ; BRBPR [ ]   GU: Hematuria[ ] ; Dysuria [ ] ; Nocturia[ ]   Vascular: Pain in legs with walking [ ] ; Pain in feet with lying flat [ ] ; Non-healing sores [ ] ; Stroke [ ] ; TIA [ ] ; Slurred speech [ ] ;  Neuro: Headaches[ ] ; Vertigo[ ] ; Seizures[ ] ; Paresthesias[ ] ;Blurred vision [ ] ; Diplopia [ ] ; Vision changes [ ]   Ortho/Skin: Arthritis [ ] ; Joint pain [ ] ; Muscle pain [ ] ; Joint swelling [ ] ; Back Pain [ ] ; Rash [ ]   Psych: Depression[ ] ; Anxiety[ ]   Heme: Bleeding problems [ ] ; Clotting disorders [ ] ; Anemia [ ]   Endocrine: Diabetes [ ] ; Thyroid  dysfunction[ ]   Physical Exam/Data:   Vitals:   03/02/24 2226 03/03/24 0030 03/03/24 0031  BP: (!) 156/72 134/63   Pulse: (!) 47 (!) 39   Resp: 18 18   Temp: 98 F (36.7 C) 97.7 F (36.5 C)   TempSrc: Oral Oral   SpO2: 98%  95%  Weight: 85.4 kg    Height: 5' 7 (1.702 m)     No intake or output data in the 24 hours ending 03/03/24 0313 Filed Weights   03/02/24 2226  Weight: 85.4 kg   Body mass index is 29.49 kg/m.  General:  Well nourished, well developed, in no acute distress HEENT: normal Neck: no JVD Endocrine:  No thryomegaly Vascular: No carotid bruits; FA pulses 2+ bilaterally without bruits  Cardiac:  decreased rate  Lungs:  clear to auscultation bilaterally Abd: soft, nontender, no hepatomegaly  Ext: tace  edema Skin: warm and dry, areas of erythema on her legs    EKG:  The ECG that was done demonstrates 2:1 AV block   Relevant CV Studies: None   Laboratory Data:  Chemistry Recent Labs  Lab 03/02/24 1310 03/03/24 0204  NA 137 138  K 3.2* 3.2*  CL 93* 96*  CO2 24 25  GLUCOSE 339* 234*  BUN 32* 36*  CREATININE 6.78* 7.90*  CALCIUM  7.9* 8.0*  GFRNONAA 7* 5*  ANIONGAP 20* 17*    No results for input(s): PROT, ALBUMIN, AST, ALT, ALKPHOS, BILITOT in the last 168 hours. Hematology Recent Labs  Lab 03/02/24 1310 03/03/24 0204  WBC 7.0 8.2  RBC 3.28* 3.22*  HGB 10.8* 10.7*  HCT 34.1* 33.5*  MCV 104.0* 104.0*  MCH 32.9 33.2  MCHC 31.7 31.9  RDW 14.8 15.1  PLT 218 215   Cardiac EnzymesNo results for input(s): TROPONINI in the last 168 hours. No results for input(s): TROPIPOC in the last 168 hours.  BNPNo results for input(s): BNP, PROBNP in the last 168 hours.  DDimer No results for input(s): DDIMER in the last 168 hours.  Radiology/Studies:  DG Chest Port 1 View Result Date: 03/02/2024 CLINICAL DATA:  Chest pain. EXAM: PORTABLE CHEST 1 VIEW COMPARISON:  02/20/2024. FINDINGS: Low lung volumes, similar to the prior exam. Defibrillator pad overlies the left chest wall. Stable cardiomegaly. Right sided CVC catheter tip overlies the superior cavoatrial junction, unchanged. No focal consolidation, pleural effusion, or pneumothorax. No acute osseous abnormality. IMPRESSION: 1. Low lung volumes.  No acute cardiopulmonary findings. 2. Stable cardiomegaly. Electronically Signed   By: Harrietta Sherry M.D.   On: 03/02/2024 14:03    Assessment and Plan:   Ms. Keizer presents to the ED today with her husband in the setting of bradycardia. EKG on presentation demonstrates 2:1 AV block at 45 bpm. I asked her to exert herself at bedside and she was very limited in her ability to do so (limited by SOB). However, her HR did not increased beyond 45 bpm pointing away from  increased vagal tone. Would recommend the work up below.  - ECHO in the AM to rule out infiltrative disease and new WMA - CTA of the coronaries to rule out ischemia - K and TSH are normal       Signed, Merlene JAYSON Blood, MD  03/03/2024 3:13 AM

## 2024-03-04 DIAGNOSIS — I442 Atrioventricular block, complete: Secondary | ICD-10-CM | POA: Diagnosis not present

## 2024-03-04 LAB — FOLATE: Folate: 9.5 ng/mL (ref >5.9)

## 2024-03-04 LAB — GLUCOSE, CAPILLARY
Glucose-Capillary: 181 mg/dL — ABNORMAL HIGH (ref 70–99)
Glucose-Capillary: 341 mg/dL — ABNORMAL HIGH (ref 70–99)
Glucose-Capillary: 275 mg/dL — ABNORMAL HIGH (ref 70–99)
Glucose-Capillary: 107 mg/dL — ABNORMAL HIGH (ref 70–99)

## 2024-03-04 LAB — VITAMIN B12: Vitamin B-12: 1005 pg/mL — ABNORMAL HIGH (ref 180–914)

## 2024-03-04 MED ORDER — SODIUM CHLORIDE 0.9 % IV SOLN
1.0000 g | Freq: Once | INTRAVENOUS | Status: AC
  Administered 2024-03-04: 1 g via INTRAVENOUS
  Filled 2024-03-04: qty 10

## 2024-03-04 NOTE — Progress Notes (Signed)
 Nephrology Follow-Up Consult note   Assessment/Recommendations: [name] is a/an 58 y.o. female with a past medical history significant for ESRD, admitted for bradycardia.      Dialysis Orders: Center: [address]  MWF .    Assessment/Plan:  [facility]-work up per EP/primary. Plans for leadless PPM when able  Hypokalemia-replete prn  ESRD -  MWF per schedule  Hypertension/volume  - BP and volume fairly well controlled. No antihypertensive medications on home med list  Anemia  -hemoglobin acceptable.  Continue to monitor  Metabolic bone disease -continue home phosphorus binders.  Calcium  somewhat low.  Continue to monitor for now  Nutrition - Albumin acceptable. Renal/Carb mod diet.Nepro.  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  Needs last dose.  Will order 1 dose of cefepime  1 g today to complete her course   Recommendations conveyed to primary service.    [name] 03/04/2024 8:26 AM  ___________________________________________________________  CC: Bradycardia  Interval History/Subjective: Patient states she feels fairly well today.  Has some congestion and was recently diagnosed with pneumonia by her nephrologist.  Otherwise no complaints.  Tolerated dialysis yesterday   Medications:  Current Facility-Administered Medications  Medication Dose Route Frequency Provider Last Rate Last Admin   acetaminophen  (TYLENOL ) tablet 1,000 mg  1,000 mg Oral Q6H PRN Segars, Dorn, MD       albuterol  (PROVENTIL ) (2.5 MG/3ML) 0.083% nebulizer solution 2.5 mg  2.5 mg Nebulization Q4H PRN Segars, Dorn, MD       ceFEPIme  (MAXIPIME ) 1 g in sodium chloride  0.9 % 100 mL IVPB  1 g Intravenous Once Jovanne Riggenbach J, MD       Chlorhexidine  Gluconate Cloth 2 % PADS 6 each  6 each Topical Daily Segars, Jonathan, MD    6 each at 03/03/24 1100   Chlorhexidine  Gluconate Cloth 2 % PADS 6 each  6 each Topical Q0600 Delores Ricka Maidens, NP   6 each at 03/04/24 0645   insulin  aspart (novoLOG ) injection 0-5 Units  0-5 Units Subcutaneous QHS Segars, Jonathan, MD   2 Units at 03/03/24 0039   insulin  aspart (novoLOG ) injection 0-6 Units  0-6 Units Subcutaneous TID WC Segars, Jonathan, MD   1 Units at 03/04/24 9340   insulin  aspart (novoLOG ) injection 10 Units  10 Units Subcutaneous TID WC Segars, Jonathan, MD   10 Units at 03/03/24 1731   melatonin tablet 6 mg  6 mg Oral QHS PRN Segars, Dorn, MD       polyethylene glycol (MIRALAX  / GLYCOLAX ) packet 17 g  17 g Oral Daily PRN Segars, Jonathan, MD       sevelamer  carbonate (RENVELA ) tablet 1,600 mg  1,600 mg Oral TID WC Segars, Jonathan, MD   1,600 mg at 03/03/24 1726   sodium chloride  flush (NS) 0.9 % injection 3 mL  3 mL Intravenous Q12H Segars, Jonathan, MD   3 mL at 03/03/24 2230      Review of Systems: 10 systems reviewed and negative except per interval history/subjective  Physical Exam: Vitals:   03/03/24 2305 03/04/24 0458  BP: 122/72 (!) 120/49  Pulse: (!) 45 (!) 44  Resp: 16 16  Temp: 98 F (36.7 C) 97.6 F (36.4 C)  SpO2: 97% 97%   No intake/output data recorded.  Intake/Output Summary (Last 24 hours) at 03/04/2024 0826 Last data filed at 03/03/2024 2215 Gross per 24  hour  Intake 50 ml  Output 1500 ml  Net -1450 ml   Constitutional: well-appearing, no acute distress ENMT: ears and nose without scars or lesions, MMM CV: normal rate, no edema Respiratory: Lateral chest rise, normal work of breathing Gastrointestinal: soft, non-tender, no palpable masses or hernias Skin: no visible lesions or rashes Psych: alert, judgement/insight appropriate, appropriate mood and affect   Test Results I personally reviewed new and old clinical labs and radiology tests Lab Results  Component Value Date   NA 138 03/03/2024   K 3.2 (L) 03/03/2024   CL 96  (L) 03/03/2024   CO2 25 03/03/2024   BUN 36 (H) 03/03/2024   CREATININE 7.90 (H) 03/03/2024   CALCIUM  8.0 (L) 03/03/2024   ALBUMIN 3.6 02/20/2024   PHOS 6.2 (H) 03/03/2024    CBC Recent Labs  Lab 03/02/24 1310 03/03/24 0204  WBC 7.0 8.2  HGB 10.8* 10.7*  HCT 34.1* 33.5*  MCV 104.0* 104.0*  PLT 218 215

## 2024-03-04 NOTE — Plan of Care (Signed)
  Problem: Clinical Measurements: Goal: Will remain free from infection 03/04/2024 0500 by Young Jacobsen, RN Outcome: Progressing 03/04/2024 0500 by Young Jacobsen, RN Outcome: Not Progressing   Problem: Clinical Measurements: Goal: Diagnostic test results will improve 03/04/2024 0500 by Young Jacobsen, RN Outcome: Progressing 03/04/2024 0500 by Young Jacobsen, RN Outcome: Not Progressing   Problem: Clinical Measurements: Goal: Respiratory complications will improve 03/04/2024 0500 by Young Jacobsen, RN Outcome: Progressing 03/04/2024 0500 by Young Jacobsen, RN Outcome: Not Progressing

## 2024-03-04 NOTE — Progress Notes (Signed)
 PROGRESS NOTE    [name]  FMW:[id number] DOB: 02-03-1966 DOA: 03/02/2024 PCP: Pcp, No  Outpatient Specialists:     Brief Narrative:  Patient is a 58 year old female past medical history significant for diabetes mellitus, hypertension, nephrolithiasis, neuromuscular disorder, stroke, and anemia and end-stage renal disease on hemodialysis.  Patient does dialyze Monday, Wednesday and Friday.  Patient has right chest tunneled hemodialysis catheter.  Patient was admitted with symptomatic bradycardia secondary to complete heart block.  Electrophysiology team has been consulted.  Electrophysiology team plans to place leadless pacemaker next week.  03/03/2024: Patient seen.  Patient continues to report dizziness.  Echocardiogram is being performed.  No other complaints.  Nephrology team consulted.  03/04/2024: Patient seen alongside patient's husband.  No new complaints.  Monitor and correct electrolytes.  Renal replacement therapy as directed by the nephrology team.  Potassium of 3.2 noted.  Electrophysiology team plans pacemaker placement next week.   Assessment & Plan:   Principal Problem:   Complete heart block (HCC) Active Problems:   Elevated troponin   Complete heart block, junctional escape rhythm: Symptomatic bradycardia: Reports subacute symptoms of exertional shortness of breath and chest pressure, lightheadedness x 3 weeks.  Acutely worsening in the past 24 hours with persistent bradycardia in the 40s on home pulse ox.  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  Labs notable for acute myocardial injury with flat hs troponin at 51. TSH wnl.  - At [facility] cardiology was consulted and discussed with EP recommending for evaluation for pacemaker placement for which she was transferred to [facility]. - Notified cardiology fellow Dr. Duffy of case, will consult with plan for EP evaluation for PPM.  - Maintain pacer pads in  place  - TTE ordered - For pacemaker placement next week.  03/04/2024: See above documentation.    Hypokalemia, mild - Potassium of 3.2. - Continue to monitor and replete.  Replete.   Chronic medical problems: ESRD on MWF HD:  - Nephrology team is directing hemodialysis. - Continue Renvela  for elevated phosphorus.   - Monitor and replete abnormal electrolytes.    History of CVA: Noted, not on aspirin  or cholesterol-lowering therapy.  Hypertension:  - Blood pressure is currently controlled.    Diabetes mellitus:  - No longer on basal insulin .   --Continue subcutaneous NovoLog  10 units 3 times daily with meals. - Continue sliding scale insulin  coverage.    Anemia chronic kidney disease:  -B12 of 1005. - Folic acid  level of 9.5.  Asthma:  - Stable.    DVT prophylaxis: SCD. Code Status: Full code. Family Communication:  Disposition Plan: Patient is inpatient.   Consultants:  Electrophysiology team.  Procedures:  Echo For leadless pacemaker placement next week.  Antimicrobials:  None.   Subjective: Patient continues to report dizziness.  Objective: Vitals:   03/03/24 2305 03/04/24 0458 03/04/24 0834 03/04/24 1159  BP: 122/72 (!) 120/49 (!) 141/69 (!) 141/69  Pulse: (!) 45 (!) 44 (!) 45 90  Resp: 16 16 20 19   Temp: 98 F (36.7 C) 97.6 F (36.4 C) 97.8 F (36.6 C) 98.1 F (36.7 C)  TempSrc: Oral Oral Oral Oral  SpO2: 97% 97% 92% 97%  Weight:  84.8 kg    Height:        Intake/Output Summary (Last 24 hours) at 03/04/2024 1459 Last data filed at 03/04/2024 1326 Gross per 24 hour  Intake 287 ml  Output 1500 ml  Net -1213 ml  Filed Weights   03/03/24 0514 03/03/24 1841 03/04/24 0458  Weight: 85.3 kg 85.5 kg 84.8 kg    Examination:  General exam: Appears calm and comfortable  Respiratory system: Clear to auscultation.  Cardiovascular system: S1 & S2 heard, with systolic murmur  Gastrointestinal system: Abdomen is soft and nontender.   Central  nervous system: Awake and alert. Extremities: Mild lower extremity edema.  Data Reviewed: I have personally reviewed following labs and imaging studies  CBC: Recent Labs  Lab 03/02/24 1310 03/03/24 0204  WBC 7.0 8.2  HGB 10.8* 10.7*  HCT 34.1* 33.5*  MCV 104.0* 104.0*  PLT 218 215   Basic Metabolic Panel: Recent Labs  Lab 03/02/24 1310 03/03/24 0204  NA 137 138  K 3.2* 3.2*  CL 93* 96*  CO2 24 25  GLUCOSE 339* 234*  BUN 32* 36*  CREATININE 6.78* 7.90*  CALCIUM  7.9* 8.0*  MG  --  2.0  PHOS  --  6.2*   GFR: Estimated Creatinine Clearance: 8.6 mL/min (A) (by C-G formula based on SCr of 7.9 mg/dL (H)). Liver Function Tests: No results for input(s): AST, ALT, ALKPHOS, BILITOT, PROT, ALBUMIN in the last 168 hours. No results for input(s): LIPASE, AMYLASE in the last 168 hours. No results for input(s): AMMONIA in the last 168 hours. Coagulation Profile: No results for input(s): INR, PROTIME in the last 168 hours. Cardiac Enzymes: No results for input(s): CKTOTAL, CKMB, CKMBINDEX, TROPONINI in the last 168 hours. BNP (last 3 results) No results for input(s): PROBNP in the last 8760 hours. HbA1C: Recent Labs    03/03/24 0204  HGBA1C 9.8*   CBG: Recent Labs  Lab 03/03/24 1133 03/03/24 1538 03/03/24 2219 03/04/24 0626 03/04/24 1156  GLUCAP 250* 161* 148* 181* 341*   Lipid Profile: Recent Labs    03/03/24 0204  CHOL 86  HDL 28*  LDLCALC 39  TRIG 96  CHOLHDL 3.1   Thyroid  Function Tests: Recent Labs    03/02/24 1310  TSH 2.406   Anemia Panel: Recent Labs    03/04/24 0259  VITAMINB12 1,005*  FOLATE 9.5   Urine analysis: No results found for: COLORURINE, APPEARANCEUR, LABSPEC, PHURINE, GLUCOSEU, HGBUR, BILIRUBINUR, KETONESUR, PROTEINUR, UROBILINOGEN, NITRITE, LEUKOCYTESUR Sepsis Labs: @LABRCNTIP (procalcitonin:4,lacticidven:4)  )No results found for this or any previous visit (from the past  240 hours).       Radiology Studies: ECHOCARDIOGRAM COMPLETE Result Date: 03/03/2024    ECHOCARDIOGRAM REPORT   Patient Name:   SOLEIL MAS Date of Exam: 03/03/2024 Medical Rec #:  [id number]    Height:       67.0 in Accession #:    [id number]   Weight:       188.1 lb Date of Birth:  04-06-1966    BSA:          1.970 m Patient Age:    57 years     BP:           139/69 mmHg Patient Gender: F            HR:           49 bpm. Exam Location:  Inpatient Procedure: 2D Echo, Strain Analysis, Cardiac Doppler and Color Doppler (Both            Spectral and Color Flow Doppler were utilized during procedure). 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  Arrythmias:Bradycardia, Signs/Symptoms:Shortness of Breath; Risk                 Factors:Hypertension, Diabetes and Dyslipidemia.  Sonographer:    Koleen Popper RDCS Referring Phys: [id number] JONATHAN SEGARS  Sonographer Comments: Global longitudinal strain was attempted. IMPRESSIONS  1. Left ventricular ejection fraction, by estimation, is 40 to 45%. The left ventricle has mildly decreased function. The left ventricle demonstrates regional wall motion abnormalities (see scoring diagram/findings for description). The left ventricular  internal cavity size was moderately dilated. Left ventricular diastolic function could not be evaluated. Elevated left atrial pressure. The E/e' is 17.  2. Right ventricular systolic function is low normal. The right ventricular size is normal. increased. A catheter within the right atrium is visualized. There is severely elevated pulmonary artery systolic pressure.  3. The mitral valve is degenerative. Severe mitral valve regurgitation. Mild mitral valve stenosis (MG , MVA (VTI) 1.07cm2, HR 46bpm, RVSP ). Accuracy limited due to complete heart block. mitral stenosis. Severe mitral annular calcification.  4. Tricuspid valve regurgitation is  severe.  5. The aortic valve is normal in structure. Aortic valve regurgitation is not visualized. Aortic valve sclerosis with mild stenosis (peak velocity 2.30m/s, MG , AVA VTI 1.33cm2, DI 0.47). LCC is not very mobile.  6. Pulmonic valve regurgitation is moderate.  7. The inferior vena cava is dilated in size with <50% respiratory variability, suggesting right atrial pressure of 15 mmHg. Comparison(s): No prior Echocardiogram. FINDINGS  Left Ventricle: Left ventricular ejection fraction, by estimation, is 40 to 45%. The left ventricle has mildly decreased function. The left ventricle demonstrates regional wall motion abnormalities. Global longitudinal strain performed but not reported based on interpreter judgement due to suboptimal tracking. The left ventricular internal cavity size was moderately dilated. There is no left ventricular hypertrophy. Left ventricular diastolic function could not be evaluated due to mitral annular calcification (moderate or greater). Left ventricular diastolic function could not be evaluated. Elevated left atrial pressure. The E/e' is 53.  LV Wall Scoring: The entire septum and mid inferior segment are hypokinetic. Right Ventricle: The right ventricular size is normal. Increased. Right ventricular systolic function is low normal. There is severely elevated pulmonary artery systolic pressure. The tricuspid regurgitant velocity is 3.63 m/s, and with an assumed right atrial pressure of 15 mmHg, the estimated right ventricular systolic pressure is 67.7 mmHg. Left Atrium: Left atrial size was normal in size. Right Atrium: Right atrial size was normal in size. Pericardium: There is no evidence of pericardial effusion. Mitral Valve: The mitral valve is degenerative in appearance. There is mild thickening of the mitral valve leaflet(s). There is mild calcification of the mitral valve leaflet(s). Mildly decreased mobility of the mitral valve leaflets. Severe mitral annular calcification.  Severe mitral valve regurgitation. Mild mitral valve stenosis (MG , MVA (VTI) 1.07cm2, HR 46bpm, RVSP ). Accuracy limited due to complete heart block. mitral valve stenosis. MV peak gradient, 11.7 mmHg. The mean mitral valve gradient is 3.0 mmHg. Tricuspid Valve: The tricuspid valve is grossly normal. Tricuspid valve regurgitation is severe. No evidence of tricuspid stenosis. The flow in the hepatic veins is reversed during ventricular systole. Aortic Valve: The aortic valve is normal in structure. Aortic valve regurgitation is not visualized. Aortic valve sclerosis with mild stenosis (peak velocity 2.84m/s, MG , AVA VTI 1.33cm2, DI 0.47). LCC is not very mobile. Aortic valve mean gradient  measures 10.5 mmHg. Aortic valve peak gradient measures 27.8 mmHg. Aortic valve area, by VTI measures 1.33 cm. Pulmonic Valve:  The pulmonic valve was grossly normal. Pulmonic valve regurgitation is moderate. No evidence of pulmonic stenosis. Aorta: The aortic root and ascending aorta are structurally normal, with no evidence of dilitation. Venous: The inferior vena cava is dilated in size with less than 50% respiratory variability, suggesting right atrial pressure of 15 mmHg. IAS/Shunts: The atrial septum is grossly normal. Additional Comments: A catheter within the right atrium is visualized.  LEFT VENTRICLE PLAX 2D LVIDd:         5.80 cm   Diastology LVIDs:         4.40 cm   LV e' medial:    7.83 cm/s LV PW:         1.00 cm   LV E/e' medial:  18.4 LV IVS:        0.90 cm   LV e' lateral:   8.81 cm/s LVOT diam:     1.90 cm   LV E/e' lateral: 16.3 LV SV:         57 LV SV Index:   29 LVOT Area:     2.84 cm  RIGHT VENTRICLE            IVC RV Basal diam:  4.30 cm    IVC diam: 2.20 cm RV Mid diam:    3.90 cm RV S prime:     8.70 cm/s TAPSE (M-mode): 1.8 cm LEFT ATRIUM             Index        RIGHT ATRIUM           Index LA diam:        5.20 cm 2.64 cm/m   RA Area:     17.70 cm LA Vol (A2C):   61.0 ml 30.96 ml/m   RA Volume:   50.20 ml  25.48 ml/m LA Vol (A4C):   61.5 ml 31.22 ml/m LA Biplane Vol: 61.3 ml 31.12 ml/m  AORTIC VALVE AV Area (Vmax):    1.26 cm AV Area (Vmean):   1.28 cm AV Area (VTI):     1.33 cm AV Vmax:           263.75 cm/s AV Vmean:          148.000 cm/s AV VTI:            0.428 m AV Peak Grad:      27.8 mmHg AV Mean Grad:      10.5 mmHg LVOT Vmax:         117.00 cm/s LVOT Vmean:        66.900 cm/s LVOT VTI:          0.201 m LVOT/AV VTI ratio: 0.47  AORTA Ao Root diam: 2.90 cm Ao Asc diam:  3.20 cm MITRAL VALVE                TRICUSPID VALVE MV Area (PHT): 2.95 cm     TR Peak grad:   52.7 mmHg MV Area VTI:   1.07 cm     TR Vmax:        363.00 cm/s MV Peak grad:  11.7 mmHg MV Mean grad:  3.0 mmHg     SHUNTS MV Vmax:       1.71 m/s     Systemic VTI:  0.20 m MV Vmean:      70.1 cm/s    Systemic Diam: 1.90 cm MV Decel Time: 257 msec MV E velocity: 144.00 cm/s MV A velocity: 82.60 cm/s MV E/A ratio:  1.74 Sunit Tolia Electronically signed by [employer] Signature Date/Time: 03/03/2024/1:50:34 PM    Final         Scheduled Meds:  Chlorhexidine  Gluconate Cloth  6 each Topical Daily   Chlorhexidine  Gluconate Cloth  6 each Topical Q0600   insulin  aspart  0-5 Units Subcutaneous QHS   insulin  aspart  0-6 Units Subcutaneous TID WC   insulin  aspart  10 Units Subcutaneous TID WC   sevelamer  carbonate  1,600 mg Oral TID WC   sodium chloride  flush  3 mL Intravenous Q12H   Continuous Infusions:   LOS: 2 days    Time spent: 35 minutes.    Leatrice Chapel, MD  [facility] Pager #: [phone] 7PM-7AM contact night coverage as above

## 2024-03-04 NOTE — Progress Notes (Signed)
 Progress Note  Patient Name: [name] Date of Encounter: 03/04/2024 [facility] Cardiologist: None   Interval Summary   Dialysis yesterday evening. No acute overnight events. Patient reports feeling relatively well. No new or acute complaints.   Vital Signs Vitals:   03/03/24 2215 03/03/24 2305 03/04/24 0458 03/04/24 0834  BP: (!) 135/57 122/72 (!) 120/49 (!) 141/69  Pulse: (!) 46 (!) 45 (!) 44 (!) 45  Resp: 17 16 16 20   Temp: 97.6 F (36.4 C) 98 F (36.7 C) 97.6 F (36.4 C) 97.8 F (36.6 C)  TempSrc: Oral Oral Oral Oral  SpO2: 99% 97% 97% 92%  Weight:   84.8 kg   Height:        Intake/Output Summary (Last 24 hours) at 03/04/2024 1014 Last data filed at 03/03/2024 2215 Gross per 24 hour  Intake 50 ml  Output 1500 ml  Net -1450 ml      03/04/2024    4:58 AM 03/03/2024    6:41 PM 03/03/2024    5:14 AM  Last 3 Weights  Weight (lbs) 186 lb 15.2 oz 188 lb 7.9 oz 188 lb 0.8 oz  Weight (kg) 84.8 kg 85.5 kg 85.3 kg      Telemetry/ECG  SR with [facility] - Personally Reviewed  Physical Exam  General: Well developed, in no acute distress.  Neck: No JVD.  Chest: Right sided tunneled dialysis line Cardiac: Bradycardic, regular rhythm.  Resp: Normal work of breathing.  Ext: No edema.  Neuro: No gross focal deficits.  Psych: Normal affect.   Echo 03/03/24:   1. Left ventricular ejection fraction, by estimation, is 40 to 45%. The  left ventricle has mildly decreased function. The left ventricle  demonstrates regional wall motion abnormalities (see scoring  diagram/findings for description). The left ventricular   internal cavity size was moderately dilated. Left ventricular diastolic  function could not be evaluated. Elevated left atrial pressure. The E/e'  is 17.   2. Right ventricular systolic function is low normal. The right  ventricular size is normal. increased. A catheter within the right atrium  is visualized. There is severely elevated pulmonary artery systolic   pressure.   3. The mitral valve is degenerative. Severe mitral valve regurgitation.  Mild mitral valve stenosis (MG , MVA (VTI) 1.07cm2, HR 46bpm, RVSP  ). Accuracy limited due to complete heart block. mitral stenosis.  Severe mitral annular calcification.   4. Tricuspid valve regurgitation is severe.   5. The aortic valve is normal in structure. Aortic valve regurgitation is  not visualized. Aortic valve sclerosis with mild stenosis (peak velocity  2.33m/s, MG , AVA VTI 1.33cm2, DI 0.47). LCC is not very mobile.   6. Pulmonic valve regurgitation is moderate.   7. The inferior vena cava is dilated in size with <50% respiratory  variability, suggesting right atrial pressure of 15 mmHg.   Assessment & Plan  #. Complete heart block: No reversible cause has been identified. Sinus rates in the 90s. #. Symptomatic bradycardia: - Prior LUE fistula. Right sided tunneled dialysis catheter. Will need leadless pacer. Tentatively implant sometime next week. Will need general anesthesia. Micra AV vs Aveir dual chamber. - Mildly reduced LVEF on echo. Not sure that risks of biventricular transvenous pacing in setting of dialysis would exceed benefits.     #. ESRD:  - Continue frequent dialysis to manage electrolytes and volume status. Appreciate nephrology assistance.  For questions or updates, please contact [facility] Please consult [email or web address] for contact info under  Signed, Fonda Kitty, MD

## 2024-03-05 DIAGNOSIS — I442 Atrioventricular block, complete: Secondary | ICD-10-CM | POA: Diagnosis not present

## 2024-03-05 LAB — CBC WITH DIFFERENTIAL/PLATELET
Abs Immature Granulocytes: 0.06 K/uL (ref 0.00–0.07)
Basophils Absolute: 0.1 K/uL (ref 0.0–0.1)
Basophils Relative: 1 %
Eosinophils Absolute: 0.1 K/uL (ref 0.0–0.5)
Eosinophils Relative: 1 %
HCT: 34.4 % — ABNORMAL LOW (ref 36.0–46.0)
Hemoglobin: 10.9 g/dL — ABNORMAL LOW (ref 12.0–15.0)
Immature Granulocytes: 1 %
Lymphocytes Relative: 9 %
Lymphs Abs: 0.9 K/uL (ref 0.7–4.0)
MCH: 32.8 pg (ref 26.0–34.0)
MCHC: 31.7 g/dL (ref 30.0–36.0)
MCV: 103.6 fL — ABNORMAL HIGH (ref 80.0–100.0)
Monocytes Absolute: 0.5 K/uL (ref 0.1–1.0)
Monocytes Relative: 5 %
Neutro Abs: 8.1 K/uL — ABNORMAL HIGH (ref 1.7–7.7)
Neutrophils Relative %: 83 %
Platelets: 212 K/uL (ref 150–400)
RBC: 3.32 MIL/uL — ABNORMAL LOW (ref 3.87–5.11)
RDW: 15.6 % — ABNORMAL HIGH (ref 11.5–15.5)
WBC: 9.8 K/uL (ref 4.0–10.5)
nRBC: 0.2 % (ref 0.0–0.2)

## 2024-03-05 LAB — RENAL FUNCTION PANEL
Albumin: 3.4 g/dL — ABNORMAL LOW (ref 3.5–5.0)
Anion gap: 17 — ABNORMAL HIGH (ref 5–15)
BUN: 39 mg/dL — ABNORMAL HIGH (ref 6–20)
CO2: 22 mmol/L (ref 22–32)
Calcium: 9 mg/dL (ref 8.9–10.3)
Chloride: 94 mmol/L — ABNORMAL LOW (ref 98–111)
Creatinine, Ser: 7.83 mg/dL — ABNORMAL HIGH (ref 0.44–1.00)
GFR, Estimated: 5 mL/min — ABNORMAL LOW (ref >60)
Glucose, Bld: 247 mg/dL — ABNORMAL HIGH (ref 70–99)
Phosphorus: 6.6 mg/dL — ABNORMAL HIGH (ref 2.5–4.6)
Potassium: 4.4 mmol/L (ref 3.5–5.1)
Sodium: 133 mmol/L — ABNORMAL LOW (ref 135–145)

## 2024-03-05 LAB — TROPONIN I (HIGH SENSITIVITY)
Troponin I (High Sensitivity): 52 ng/L — ABNORMAL HIGH (ref <18)
Troponin I (High Sensitivity): 49 ng/L — ABNORMAL HIGH (ref <18)

## 2024-03-05 LAB — GLUCOSE, CAPILLARY
Glucose-Capillary: 197 mg/dL — ABNORMAL HIGH (ref 70–99)
Glucose-Capillary: 262 mg/dL — ABNORMAL HIGH (ref 70–99)
Glucose-Capillary: 295 mg/dL — ABNORMAL HIGH (ref 70–99)
Glucose-Capillary: 294 mg/dL — ABNORMAL HIGH (ref 70–99)
Glucose-Capillary: 273 mg/dL — ABNORMAL HIGH (ref 70–99)

## 2024-03-05 LAB — MAGNESIUM: Magnesium: 2.4 mg/dL (ref 1.7–2.4)

## 2024-03-05 MED ORDER — GUAIFENESIN-DM 100-10 MG/5ML PO SYRP
5.0000 mL | ORAL_SOLUTION | ORAL | Status: DC | PRN
  Administered 2024-03-05 – 2024-03-09 (×8): 5 mL via ORAL
  Filled 2024-03-05 (×8): qty 5

## 2024-03-05 MED ORDER — DM-GUAIFENESIN ER 30-600 MG PO TB12
1.0000 | ORAL_TABLET | Freq: Two times a day (BID) | ORAL | Status: DC | PRN
  Filled 2024-03-05: qty 1

## 2024-03-05 MED ORDER — GUAIFENESIN ER 600 MG PO TB12
600.0000 mg | ORAL_TABLET | Freq: Two times a day (BID) | ORAL | Status: DC | PRN
  Administered 2024-03-05: 600 mg via ORAL
  Filled 2024-03-05: qty 1

## 2024-03-05 NOTE — Progress Notes (Signed)
 PROGRESS NOTE    [name]  FMW:[id number] DOB: 29-Nov-1965 DOA: 03/02/2024 PCP: Pcp, No  Outpatient Specialists:     Brief Narrative:  Patient is a 58 year old female past medical history significant for diabetes mellitus, hypertension, nephrolithiasis, neuromuscular disorder, stroke, and anemia and end-stage renal disease on hemodialysis.  Patient does dialyze Monday, Wednesday and Friday.  Patient has right chest tunneled hemodialysis catheter.  Patient was admitted with symptomatic bradycardia secondary to complete heart block.  Electrophysiology team has been consulted.  Electrophysiology team plans to place leadless pacemaker next week.  03/05/2024: Patient seen alongside patient's husband.  No new complaints today.  EP plans pacemaker placement tomorrow.  As per collateral information, CCM they said to have reported some ST elevations.  Will proceed with twelve-lead EKG and troponin level.  Patient remains free of symptoms (no chest pain or shortness of breath).   Assessment & Plan:   Principal Problem:   Complete heart block (HCC) Active Problems:   Elevated troponin   Complete heart block, junctional escape rhythm: Symptomatic bradycardia: -No new changes. - For pacemaker placement tomorrow. - Bradycardia persists.   - Maintain pacer pads in place     Hypokalemia, mild - Last visualized potassium of 3.2. - Repeat BMP and magnesium  level.   - Continue to monitor and replete.    Concerns for possible ST elevation: - No chest pain or shortness of breath. - No symptoms. - Twelve-lead EKG. - Check troponin. - Patient remains chest pain-free.   Chronic medical problems: ESRD on MWF HD:  - Nephrology team is directing hemodialysis. - Continue Renvela  for elevated phosphorus.   - Monitor and replete abnormal electrolytes.    History of CVA: Noted, not on aspirin  or cholesterol-lowering therapy.  Hypertension:  - Blood pressure is currently controlled.    Diabetes  mellitus:  - No longer on basal insulin .   --Continue subcutaneous NovoLog  10 units 3 times daily with meals. - Continue sliding scale insulin  coverage.    Anemia chronic kidney disease:  -B12 of 1005. - Folic acid  level of 9.5.  Asthma:  - Stable.    DVT prophylaxis: SCD. Code Status: Full code. Family Communication:  Disposition Plan: Patient is inpatient.   Consultants:  Electrophysiology team.  Procedures:  Echo For leadless pacemaker placement.  Antimicrobials:  None.   Subjective: No new complaints.  Objective: Vitals:   03/05/24 0020 03/05/24 0515 03/05/24 0803 03/05/24 1212  BP: 131/73 (!) 143/56 (!) 145/95 135/76  Pulse: (!) 43 (!) 46 (!) 47 (!) 47  Resp: 18 18 18 18   Temp: 98 F (36.7 C) (!) 97.5 F (36.4 C) 97.6 F (36.4 C) 97.9 F (36.6 C)  TempSrc: Oral Oral Oral Oral  SpO2: 98% 100% 100% 92%  Weight:  89.9 kg    Height:        Intake/Output Summary (Last 24 hours) at 03/05/2024 1446 Last data filed at 03/04/2024 2252 Gross per 24 hour  Intake 437 ml  Output --  Net 437 ml   Filed Weights   03/03/24 1841 03/04/24 0458 03/05/24 0515  Weight: 85.5 kg 84.8 kg 89.9 kg    Examination:  General exam: Appears calm and comfortable  Respiratory system: Clear to auscultation.  Cardiovascular system: S1 & S2 heard, with systolic murmur  Gastrointestinal system: Abdomen is soft and nontender.   Central nervous system: Awake and alert. Extremities: Mild lower extremity edema.  Data Reviewed: I have personally reviewed following labs and imaging studies  CBC: Recent Labs  Lab 03/02/24 1310 03/03/24 0204  WBC 7.0 8.2  HGB 10.8* 10.7*  HCT 34.1* 33.5*  MCV 104.0* 104.0*  PLT 218 215   Basic Metabolic Panel: Recent Labs  Lab 03/02/24 1310 03/03/24 0204  NA 137 138  K 3.2* 3.2*  CL 93* 96*  CO2 24 25  GLUCOSE 339* 234*  BUN 32* 36*  CREATININE 6.78* 7.90*  CALCIUM  7.9* 8.0*  MG  --  2.0  PHOS  --  6.2*   GFR: Estimated  Creatinine Clearance: 8.8 mL/min (A) (by C-G formula based on SCr of 7.9 mg/dL (H)). Liver Function Tests: No results for input(s): AST, ALT, ALKPHOS, BILITOT, PROT, ALBUMIN in the last 168 hours. No results for input(s): LIPASE, AMYLASE in the last 168 hours. No results for input(s): AMMONIA in the last 168 hours. Coagulation Profile: No results for input(s): INR, PROTIME in the last 168 hours. Cardiac Enzymes: No results for input(s): CKTOTAL, CKMB, CKMBINDEX, TROPONINI in the last 168 hours. BNP (last 3 results) No results for input(s): PROBNP in the last 8760 hours. HbA1C: Recent Labs    03/03/24 0204  HGBA1C 9.8*   CBG: Recent Labs  Lab 03/04/24 1553 03/04/24 2155 03/05/24 0203 03/05/24 0642 03/05/24 1211  GLUCAP 275* 107* 197* 262* 295*   Lipid Profile: Recent Labs    03/03/24 0204  CHOL 86  HDL 28*  LDLCALC 39  TRIG 96  CHOLHDL 3.1   Thyroid  Function Tests: No results for input(s): TSH, T4TOTAL, FREET4, T3FREE, THYROIDAB in the last 72 hours.  Anemia Panel: Recent Labs    03/04/24 0259  VITAMINB12 1,005*  FOLATE 9.5   Urine analysis: No results found for: COLORURINE, APPEARANCEUR, LABSPEC, PHURINE, GLUCOSEU, HGBUR, BILIRUBINUR, KETONESUR, PROTEINUR, UROBILINOGEN, NITRITE, LEUKOCYTESUR Sepsis Labs: @LABRCNTIP (procalcitonin:4,lacticidven:4)  )No results found for this or any previous visit (from the past 240 hours).       Radiology Studies: No results found.       Scheduled Meds:  Chlorhexidine  Gluconate Cloth  6 each Topical Daily   Chlorhexidine  Gluconate Cloth  6 each Topical Q0600   insulin  aspart  0-5 Units Subcutaneous QHS   insulin  aspart  0-6 Units Subcutaneous TID WC   insulin  aspart  10 Units Subcutaneous TID WC   sevelamer  carbonate  1,600 mg Oral TID WC   sodium chloride  flush  3 mL Intravenous Q12H   Continuous Infusions:   LOS: 3 days    Time spent: 35  minutes.    Leatrice Chapel, MD  [facility] Pager #: [phone] 7PM-7AM contact night coverage as above

## 2024-03-05 NOTE — Progress Notes (Signed)
 Progress Note  Patient Name: [name] Date of Encounter: 03/05/2024 [facility] Cardiologist: None   Interval Summary   No acute overnight events. Patient reports feeling relatively well. No new or acute complaints.    Vital Signs Vitals:   03/04/24 2024 03/05/24 0020 03/05/24 0515 03/05/24 0803  BP: 137/81 131/73 (!) 143/56 (!) 145/95  Pulse: (!) 47 (!) 43 (!) 46 (!) 47  Resp: 18 18 18 18   Temp: 98 F (36.7 C) 98 F (36.7 C) (!) 97.5 F (36.4 C) 97.6 F (36.4 C)  TempSrc: Oral Oral Oral Oral  SpO2: 98% 98% 100% 100%  Weight:   89.9 kg   Height:        Intake/Output Summary (Last 24 hours) at 03/05/2024 0938 Last data filed at 03/04/2024 2252 Gross per 24 hour  Intake 674 ml  Output --  Net 674 ml      03/05/2024    5:15 AM 03/04/2024    4:58 AM 03/03/2024    6:41 PM  Last 3 Weights  Weight (lbs) 198 lb 3.2 oz 186 lb 15.2 oz 188 lb 7.9 oz  Weight (kg) 89.903 kg 84.8 kg 85.5 kg      Telemetry/ECG  SR with CHB - Personally Reviewed  Physical Exam  General: Well developed, in no acute distress.  Neck: No JVD.  Cardiac: Normal rate, regular rhythm.  Resp: Normal work of breathing.  Ext: No edema.  Neuro: No gross focal deficits.  Psych: Normal affect.   Echo 03/03/24:   1. Left ventricular ejection fraction, by estimation, is 40 to 45%. The  left ventricle has mildly decreased function. The left ventricle  demonstrates regional wall motion abnormalities (see scoring  diagram/findings for description). The left ventricular   internal cavity size was moderately dilated. Left ventricular diastolic  function could not be evaluated. Elevated left atrial pressure. The E/e'  is 17.   2. Right ventricular systolic function is low normal. The right  ventricular size is normal. increased. A catheter within the right atrium  is visualized. There is severely elevated pulmonary artery systolic  pressure.   3. The mitral valve is degenerative. Severe mitral valve  regurgitation.  Mild mitral valve stenosis (MG , MVA (VTI) 1.07cm2, HR 46bpm, RVSP  ). Accuracy limited due to complete heart block. mitral stenosis.  Severe mitral annular calcification.   4. Tricuspid valve regurgitation is severe.   5. The aortic valve is normal in structure. Aortic valve regurgitation is  not visualized. Aortic valve sclerosis with mild stenosis (peak velocity  2.72m/s, MG , AVA VTI 1.33cm2, DI 0.47). LCC is not very mobile.   6. Pulmonic valve regurgitation is moderate.   7. The inferior vena cava is dilated in size with <50% respiratory  variability, suggesting right atrial pressure of 15 mmHg.   Assessment & Plan  #. Complete heart block: No reversible cause has been identified. Sinus rates in the 90s. #. Symptomatic bradycardia: - Prior LUE fistula. Right sided tunneled dialysis catheter. Will need leadless pacer. Tentatively implant sometime this week. Will hopefully finalize a time during Monday morning EP meeting. Will need general anesthesia. Micra AV vs Aveir dual chamber. Make NPO after midnight just in case we can accommodate tomorrow. - Mildly reduced LVEF on echo. Not sure that risks of biventricular transvenous pacing in setting of dialysis would exceed benefits. Volume status to be managed with dialysis. No clinical HF otherwise.   #. ESRD:  - Continue frequent dialysis to manage electrolytes and  volume status. Appreciate nephrology assistance.  For questions or updates, please contact [facility] Please consult [email or web address] for contact info under       Signed, Fonda Kitty, MD

## 2024-03-05 NOTE — Plan of Care (Signed)

## 2024-03-05 NOTE — Progress Notes (Signed)
 Nephrology Follow-Up Consult note   Assessment/Recommendations: [name] is a/an 58 y.o. female with a past medical history significant for ESRD, admitted for bradycardia.      Dialysis Orders: Center: [address]  MWF . Try to obtain outpatient records    Assessment/Plan:  CHB-work up per EP/primary. Plans for leadless PPM when able  Hypokalemia-replete prn  ESRD -  MWF per schedule  Hypertension/volume  - BP and volume fairly well controlled. No antihypertensive medications on home med list  Anemia  -hemoglobin acceptable.  Continue to monitor  Metabolic bone disease -continue home phosphorus binders.  Calcium  somewhat low.  Continue to monitor for now  Nutrition - Albumin acceptable. Renal/Carb mod diet.Nepro.  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    Jayson JINNY Player [facility] 03/05/2024 8:24 AM  ___________________________________________________________  CC: Bradycardia  Interval History/Subjective: Still some chest congestion but stable/improved. No other complaints.   Medications:  Current Facility-Administered Medications  Medication Dose Route Frequency Provider Last Rate Last Admin   acetaminophen  (TYLENOL ) tablet 1,000 mg  1,000 mg Oral Q6H PRN Segars, Dorn, MD       albuterol  (PROVENTIL ) (2.5 MG/3ML) 0.083% nebulizer solution 2.5 mg  2.5 mg Nebulization Q4H PRN Segars, Dorn, MD       Chlorhexidine  Gluconate Cloth 2 % PADS 6 each  6 each Topical Daily Segars, Dorn, MD   6 each at 03/04/24 1000   Chlorhexidine  Gluconate Cloth 2 % PADS 6 each  6 each Topical Q0600 Delores Ricka Maidens, NP   6 each at 03/05/24 9359   insulin  aspart (novoLOG ) injection 0-5 Units  0-5 Units Subcutaneous QHS Segars, Jonathan, MD   2 Units at 03/03/24 9960   insulin  aspart (novoLOG ) injection 0-6 Units  0-6 Units Subcutaneous TID WC Segars,  Jonathan, MD   3 Units at 03/05/24 0703   insulin  aspart (novoLOG ) injection 10 Units  10 Units Subcutaneous TID WC Segars, Jonathan, MD   10 Units at 03/04/24 1802   melatonin tablet 6 mg  6 mg Oral QHS PRN Segars, Jonathan, MD       polyethylene glycol (MIRALAX  / GLYCOLAX ) packet 17 g  17 g Oral Daily PRN Segars, Jonathan, MD       sevelamer  carbonate (RENVELA ) tablet 1,600 mg  1,600 mg Oral TID WC Segars, Jonathan, MD   1,600 mg at 03/04/24 1714   sodium chloride  flush (NS) 0.9 % injection 3 mL  3 mL Intravenous Q12H Keturah Dorn, MD   3 mL at 03/04/24 2210      Review of Systems: 10 systems reviewed and negative except per interval history/subjective  Physical Exam: Vitals:   03/05/24 0515 03/05/24 0803  BP: (!) 143/56 (!) 145/95  Pulse: (!) 46 (!) 47  Resp: 18 18  Temp: (!) 97.5 F (36.4 C) 97.6 F (36.4 C)  SpO2: 100% 100%   No intake/output data recorded.  Intake/Output Summary (Last 24 hours) at 03/05/2024 0824 Last data filed at 03/04/2024 2252 Gross per 24 hour  Intake 674 ml  Output --  Net 674 ml   Constitutional: well-appearing, no acute distress ENMT: ears and nose without scars or lesions, MMM CV: normal rate, no edema Respiratory: Lateral chest rise, normal work of breathing Gastrointestinal: soft, non-tender, no palpable masses or hernias Skin: no visible lesions or rashes Psych: alert, judgement/insight appropriate, appropriate mood and affect   Test Results I personally reviewed new and  old clinical labs and radiology tests Lab Results  Component Value Date   NA 138 03/03/2024   K 3.2 (L) 03/03/2024   CL 96 (L) 03/03/2024   CO2 25 03/03/2024   BUN 36 (H) 03/03/2024   CREATININE 7.90 (H) 03/03/2024   CALCIUM  8.0 (L) 03/03/2024   ALBUMIN 3.6 02/20/2024   PHOS 6.2 (H) 03/03/2024    CBC Recent Labs  Lab 03/02/24 1310 03/03/24 0204  WBC 7.0 8.2  HGB 10.8* 10.7*  HCT 34.1* 33.5*  MCV 104.0* 104.0*  PLT 218 215

## 2024-03-05 NOTE — Progress Notes (Signed)
 CCMD notified this RN patient having ST elevation today. Notified Dr. Rosario. Received orders for EKG and troponin. This RN went to perform EKG and patient up in bathroom. Will do EKG when patient is out of the bathroom.

## 2024-03-06 DIAGNOSIS — I442 Atrioventricular block, complete: Secondary | ICD-10-CM | POA: Diagnosis not present

## 2024-03-06 LAB — CBC
HCT: 32.3 % — ABNORMAL LOW (ref 36.0–46.0)
Hemoglobin: 10.1 g/dL — ABNORMAL LOW (ref 12.0–15.0)
MCH: 32.9 pg (ref 26.0–34.0)
MCHC: 31.3 g/dL (ref 30.0–36.0)
MCV: 105.2 fL — ABNORMAL HIGH (ref 80.0–100.0)
Platelets: 181 K/uL (ref 150–400)
RBC: 3.07 MIL/uL — ABNORMAL LOW (ref 3.87–5.11)
RDW: 15.7 % — ABNORMAL HIGH (ref 11.5–15.5)
WBC: 9.2 K/uL (ref 4.0–10.5)
nRBC: 0 % (ref 0.0–0.2)

## 2024-03-06 LAB — GLUCOSE, CAPILLARY
Glucose-Capillary: 207 mg/dL — ABNORMAL HIGH (ref 70–99)
Glucose-Capillary: 183 mg/dL — ABNORMAL HIGH (ref 70–99)
Glucose-Capillary: 173 mg/dL — ABNORMAL HIGH (ref 70–99)
Glucose-Capillary: 166 mg/dL — ABNORMAL HIGH (ref 70–99)

## 2024-03-06 LAB — RENAL FUNCTION PANEL
Albumin: 3.2 g/dL — ABNORMAL LOW (ref 3.5–5.0)
Anion gap: 20 — ABNORMAL HIGH (ref 5–15)
BUN: 44 mg/dL — ABNORMAL HIGH (ref 6–20)
CO2: 18 mmol/L — ABNORMAL LOW (ref 22–32)
Calcium: 8.8 mg/dL — ABNORMAL LOW (ref 8.9–10.3)
Chloride: 94 mmol/L — ABNORMAL LOW (ref 98–111)
Creatinine, Ser: 8.36 mg/dL — ABNORMAL HIGH (ref 0.44–1.00)
GFR, Estimated: 5 mL/min — ABNORMAL LOW (ref >60)
Glucose, Bld: 208 mg/dL — ABNORMAL HIGH (ref 70–99)
Phosphorus: 7.8 mg/dL — ABNORMAL HIGH (ref 2.5–4.6)
Potassium: 4.5 mmol/L (ref 3.5–5.1)
Sodium: 132 mmol/L — ABNORMAL LOW (ref 135–145)

## 2024-03-06 LAB — MAGNESIUM: Magnesium: 2.3 mg/dL (ref 1.7–2.4)

## 2024-03-06 LAB — HEPATITIS B SURFACE ANTIBODY, QUANTITATIVE: Hep B S AB Quant (Post): <3.5 m[IU]/mL — ABNORMAL LOW

## 2024-03-06 MED ORDER — HEPARIN SODIUM (PORCINE) 1000 UNIT/ML DIALYSIS
3000.0000 [IU] | Freq: Once | INTRAMUSCULAR | Status: AC
  Administered 2024-03-06: 3000 [IU] via INTRAVENOUS_CENTRAL

## 2024-03-06 MED ORDER — HEPARIN SODIUM (PORCINE) 1000 UNIT/ML IJ SOLN
INTRAMUSCULAR | Status: AC
  Filled 2024-03-06: qty 3

## 2024-03-06 NOTE — Plan of Care (Signed)
   Problem: Health Behavior/Discharge Planning: Goal: Ability to manage health-related needs will improve Outcome: Progressing   Problem: Clinical Measurements: Goal: Ability to maintain clinical measurements within normal limits will improve Outcome: Progressing Goal: Will remain free from infection Outcome: Progressing Goal: Diagnostic test results will improve Outcome: Progressing

## 2024-03-06 NOTE — Progress Notes (Signed)
   03/06/24 1800  Vitals  Temp (!) 97.5 F (36.4 C)  Pulse Rate (!) 44  Resp (!) 21  BP 133/63  SpO2 100 %  Weight 84.3 kg  Type of Weight Post-Dialysis  Post Treatment  Dialyzer Clearance Lightly streaked  Hemodialysis Intake (mL) 0 mL  Liters Processed 72  Fluid Removed (mL) 1000 mL  Tolerated HD Treatment Yes   Received patient in bed to unit.  Alert and oriented.  Informed consent signed and in chart.   TX duration:3hrs  Patient tolerated well.  Transported back to the room  Alert, without acute distress.  Hand-off given to patient's nurse.   Access used: [facility] Access issues: none  Total UF removed: 1L Medication(s) given: none    [facility]

## 2024-03-06 NOTE — Inpatient Diabetes Management (Signed)
 Inpatient Diabetes Program Recommendations  AACE/ADA: New Consensus Statement on Inpatient Glycemic Control (2015)  Target Ranges:  Prepandial:   less than 140 mg/dL      Peak postprandial:   less than 180 mg/dL (1-2 hours)      Critically ill patients:  140 - 180 mg/dL   Lab Results  Component Value Date   GLUCAP 207 (H) 03/06/2024   HGBA1C 9.8 (H) 03/03/2024    Latest Reference Range & Units 03/05/24 06:42 03/05/24 12:11 03/05/24 16:46 03/05/24 20:44 03/06/24 05:47  Glucose-Capillary 70 - 99 mg/dL 737 (H) 704 (H) 705 (H) 273 (H) 207 (H)  (H): Data is abnormally high  Diabetes history: DM2 Outpatient Diabetes medications: Novolog  14 units tid meal coverage Current orders for Inpatient glycemic control: Novolog  10 units tid meal coverage Novolog  0-6 units tid, 0-5 units hs  Inpatient Diabetes Program Recommendations:   Patient is currently NPO waiting for pacemaker insertion. Patient has not received the Novolog  meal coverage since Saturday. Please consider when eating: -Decrease Novolog  meal coverage to 3 units tid if eats 50% meal -Add Semglee  8 units daily (0.1 unit/kg x 0.1 =8.5 units)  Thank you, Dagoberto E. Marycatherine Maniscalco, RN, MSN, CDCES  Diabetes Coordinator Inpatient Glycemic Control Team Team Pager [phone] (8am-5pm) 03/06/2024 10:39 AM

## 2024-03-06 NOTE — Progress Notes (Signed)
 Heart Failure Navigator Progress Note  Assessed for Heart & Vascular TOC clinic readiness.  Patient does not meet criteria due to ESRD on hemodialysis. No HF TOC.   Navigator will sign off at this time.   Randie Bustle, BSN, Scientist, clinical (histocompatibility and immunogenetics) Only

## 2024-03-06 NOTE — Progress Notes (Signed)
 Nephrology Follow-Up Consult note   Assessment/Recommendations: [name] is a/an 58 y.o. female with a past medical history significant for ESRD, admitted for bradycardia.    _________________________________________________________  Subjective Seen in room, no c/o's    Physical Exam: Vitals:   03/06/24 1530 03/06/24 1600  BP: 125/68 (!) 162/146  Pulse: (!) 44 (!) 44  Resp: (!) 25 18  Temp:    SpO2: 100% 100%   Constitutional: well-appearing, no acute distress ENMT: ears and nose without scars or lesions, MMM CV: normal rate, no edema Respiratory: Lateral chest rise, normal work of breathing Gastrointestinal: soft, non-tender, no palpable masses or hernias Skin: no visible lesions or rashes Psych: alert, judgement/insight appropriate, appropriate mood and affect  OP HD: [facility] [address]  MWF try to obtain outpatient records    Assessment/Plan:  CHB-work up per EP/primary. Plans for leadless PPM when able  ESRD -  MWF per schedule  Hypertension/volume  - BP and volume fairly well controlled. No antihypertensive medications on home med list  Anemia  -hemoglobin acceptable.  Continue to monitor Recent pneumonia: finished antibiotic course inpatient   Myer Fret  MD  [facility] 03/06/2024, 4:18 PM  Recent Labs  Lab 03/03/24 0204 03/05/24 1438 03/05/24 1449 03/06/24 0448  HGB 10.7* 10.9*  --   --   ALBUMIN  --   --  3.4* 3.2*  CALCIUM  8.0*  --  9.0 8.8*  PHOS 6.2*  --  6.6* 7.8*  CREATININE 7.90*  --  7.83* 8.36*  K 3.2*  --  4.4 4.5    Inpatient medications:  Chlorhexidine  Gluconate Cloth  6 each Topical Daily   Chlorhexidine  Gluconate Cloth  6 each Topical Q0600   insulin  aspart  0-5 Units Subcutaneous QHS   insulin  aspart  0-6 Units Subcutaneous TID WC   insulin  aspart  10 Units Subcutaneous TID WC   sevelamer  carbonate  1,600 mg Oral TID WC   sodium chloride  flush  3 mL Intravenous Q12H    acetaminophen , albuterol , guaiFENesin -dextromethorphan ,  melatonin, polyethylene glycol

## 2024-03-06 NOTE — Progress Notes (Addendum)
  Patient Name: [name] Date of Encounter: 03/06/2024  Primary Cardiologist: None Electrophysiologist: None  Interval Summary   Frustrated about the possibility of not getting surgery today. Remains NPO and bradycardia  Vital Signs    Vitals:   03/05/24 1934 03/05/24 2314 03/06/24 0414 03/06/24 0708  BP: (!) 146/71 (!) 143/73 (!) 140/70 (!) 142/76  Pulse: (!) 45 (!) 45 (!) 42 (!) 45  Resp: 18 18 18 17   Temp: (!) 97.5 F (36.4 C) 97.6 F (36.4 C) (!) 97.5 F (36.4 C) 97.7 F (36.5 C)  TempSrc: Oral Oral Oral Oral  SpO2: 100% 100% 96% 100%  Weight:   85.3 kg   Height:       No intake or output data in the 24 hours ending 03/06/24 0854 Filed Weights   03/04/24 0458 03/05/24 0515 03/06/24 0414  Weight: 84.8 kg 89.9 kg 85.3 kg    Physical Exam    GEN- The patient is well appearing, alert and oriented x 3 today.   Lungs- Clear to ausculation bilaterally, normal work of breathing Cardiac- Regular rate and rhythm, no murmurs, rubs or gallops GI- soft, NT, ND, + BS Extremities- no clubbing or cyanosis. No edema  Telemetry    CHB in 40s with atrial rate in 90s (personally reviewed)  Hospital Course    Leyana Whidden is a 58 y.o. female with a hx of ESRD on MWF HD, prior CVA, hypertension, diabetes type, ?neuromuscular disorder, anemia chronic kidney disease, asthma  who is being seen for the evaluation of complete heart block   Assessment & Plan    CHB Symptomatic bradycardia NPO for possible procedure today but late start. Potentially will need to be planned for later in the week, though she asks to be left NPO throughout the morning cases with hope she can be added on later.   ESRD Will require leadless pacing  Dr. Kennyth to see for further after his first case.   ADDENDUM We will not be able to accommodate today.   Tentatively Wednesday for Dual Chamber LEADLESS PPM.   For questions or updates, please contact [facility] Please consult  [email or web address] for contact info under     Signed, Ozell Prentice Passey, PA-C  03/06/2024, 8:54 AM    I have seen, examined the patient, and reviewed the above assessment and plan.    Interval:  No acute overnight events. Patient reports feeling relatively well. No new or acute complaints.   Remains SR with CHB on tele.  General: Well developed, in no acute distress.  Neck: No JVD.  Cardiac: Bradycardic, regular rhythm.  Resp: Normal work of breathing.  Ext: No edema.  Neuro: No gross focal deficits.  Psych: Normal affect.   Assessment and Plan:  #. Complete heart block: No reversible cause has been identified. Sinus rates in the 90s. #. Symptomatic bradycardia: - Prior LUE fistula. Right sided tunneled dialysis catheter. Will need leadless pacer, ideally dual chamber. Tentatively implant on Thursday with Dr. Nancey. Will need general anesthesia.  - Mildly reduced LVEF on echo. Not sure that risks of biventricular transvenous pacing in setting of dialysis would exceed benefits. Volume status to be managed with dialysis. No clinical HF otherwise.   #. ESRD:  - Continue frequent dialysis to manage electrolytes and volume status. Appreciate nephrology assistance.  Fonda Kennyth, MD 03/06/2024 8:49 PM

## 2024-03-06 NOTE — Progress Notes (Signed)
 Patient transported to HD via transport tech. CCMD notified.

## 2024-03-06 NOTE — Progress Notes (Signed)
 PROGRESS NOTE    [name]  FMW:[id number] DOB: [birth year] DOA: 03/02/2024 PCP: Pcp, No  Outpatient Specialists:     Brief Narrative:  Patient is a 58 year old female past medical history significant for diabetes mellitus, hypertension, nephrolithiasis, neuromuscular disorder, stroke, and anemia and end-stage renal disease on hemodialysis.  Patient does dialyze Monday, Wednesday and Friday.  Patient has right chest tunneled hemodialysis catheter.  Patient was admitted with symptomatic bradycardia secondary to complete heart block.  Electrophysiology team has been consulted.  Electrophysiology team plans to place leadless pacemaker next week.  03/06/2024: Patient seen alongside patient's husband.  No new complaints today.  Seen on hemodialysis.  EP plans pacemaker placement after tomorrow.  T  Assessment & Plan:   Principal Problem:   Complete heart block (HCC) Active Problems:   Elevated troponin   Complete heart block, junctional escape rhythm: Symptomatic bradycardia: -No new changes. - For pacemaker placement after tomorrow.   - Bradycardia persists.   - Maintain pacer pads in place     Hypokalemia, mild - Last visualized potassium of 3.2. - Repeat BMP and magnesium  level.   - Continue to monitor and replete.    Concerns for possible ST elevation: - No chest pain or shortness of breath. - No symptoms. - Twelve-lead EKG. - Check troponin. - Patient remains chest pain-free.   Chronic medical problems: ESRD on MWF HD:  - Nephrology team is directing hemodialysis. - Continue Renvela  for elevated phosphorus.   - Monitor and replete abnormal electrolytes.    History of CVA: Noted, not on aspirin  or cholesterol-lowering therapy.  Hypertension:  - Blood pressure is currently controlled.    Diabetes mellitus:  - No longer on basal insulin .   --Continue subcutaneous NovoLog  10 units 3 times daily with meals. - Continue sliding scale insulin  coverage.    Anemia  chronic kidney disease:  -B12 of 1005. - Folic acid  level of 9.5.  Asthma:  - Stable.    DVT prophylaxis: SCD. Code Status: Full code. Family Communication:  Disposition Plan: Patient is inpatient.   Consultants:  Electrophysiology team.  Procedures:  Echo For leadless pacemaker placement.  Antimicrobials:  None.   Subjective: No new complaints.  Objective: Vitals:   03/06/24 1600 03/06/24 1630 03/06/24 1700 03/06/24 1730  BP: (!) 162/146 (!) 123/48 129/79 (!) 126/57  Pulse: (!) 44 (!) 44 (!) 44 (!) 43  Resp: 18 15 18 19   Temp:      TempSrc:      SpO2: 100% 100% 100% 100%  Weight:      Height:       No intake or output data in the 24 hours ending 03/06/24 1809  Filed Weights   03/04/24 0458 03/05/24 0515 03/06/24 0414  Weight: 84.8 kg 89.9 kg 85.3 kg    Examination:  General exam: Appears calm and comfortable  Respiratory system: Clear to auscultation.  Cardiovascular system: S1 & S2 heard, with systolic murmur  Gastrointestinal system: Abdomen is soft and nontender.   Central nervous system: Awake and alert. Extremities: Mild lower extremity edema.  Data Reviewed: I have personally reviewed following labs and imaging studies  CBC: Recent Labs  Lab 03/02/24 1310 03/03/24 0204 03/05/24 1438 03/06/24 1442  WBC 7.0 8.2 9.8 9.2  NEUTROABS  --   --  8.1*  --   HGB 10.8* 10.7* 10.9* 10.1*  HCT 34.1* 33.5* 34.4* 32.3*  MCV 104.0* 104.0* 103.6* 105.2*  PLT 218 215 212 181   Basic Metabolic Panel: Recent Labs  Lab 03/02/24 1310 03/03/24 0204 03/05/24 1438 03/05/24 1449 03/06/24 0448  NA 137 138  --  133* 132*  K 3.2* 3.2*  --  4.4 4.5  CL 93* 96*  --  94* 94*  CO2 24 25  --  22 18*  GLUCOSE 339* 234*  --  247* 208*  BUN 32* 36*  --  39* 44*  CREATININE 6.78* 7.90*  --  7.83* 8.36*  CALCIUM  7.9* 8.0*  --  9.0 8.8*  MG  --  2.0 2.4  --  2.3  PHOS  --  6.2*  --  6.6* 7.8*   GFR: Estimated Creatinine Clearance: 8.1 mL/min (A) (by C-G  formula based on SCr of 8.36 mg/dL (H)). Liver Function Tests: Recent Labs  Lab 03/05/24 1449 03/06/24 0448  ALBUMIN 3.4* 3.2*   No results for input(s): LIPASE, AMYLASE in the last 168 hours. No results for input(s): AMMONIA in the last 168 hours. Coagulation Profile: No results for input(s): INR, PROTIME in the last 168 hours. Cardiac Enzymes: No results for input(s): CKTOTAL, CKMB, CKMBINDEX, TROPONINI in the last 168 hours. BNP (last 3 results) No results for input(s): PROBNP in the last 8760 hours. HbA1C: No results for input(s): HGBA1C in the last 72 hours.  CBG: Recent Labs  Lab 03/05/24 1211 03/05/24 1646 03/05/24 2044 03/06/24 0547 03/06/24 1114  GLUCAP 295* 294* 273* 207* 183*   Lipid Profile: No results for input(s): CHOL, HDL, LDLCALC, TRIG, CHOLHDL, LDLDIRECT in the last 72 hours.  Thyroid  Function Tests: No results for input(s): TSH, T4TOTAL, FREET4, T3FREE, THYROIDAB in the last 72 hours.  Anemia Panel: Recent Labs    03/04/24 0259  VITAMINB12 1,005*  FOLATE 9.5   Urine analysis: No results found for: COLORURINE, APPEARANCEUR, LABSPEC, PHURINE, GLUCOSEU, HGBUR, BILIRUBINUR, KETONESUR, PROTEINUR, UROBILINOGEN, NITRITE, LEUKOCYTESUR Sepsis Labs: @LABRCNTIP (procalcitonin:4,lacticidven:4)  )No results found for this or any previous visit (from the past 240 hours).       Radiology Studies: No results found.       Scheduled Meds:  Chlorhexidine  Gluconate Cloth  6 each Topical Daily   Chlorhexidine  Gluconate Cloth  6 each Topical Q0600   insulin  aspart  0-5 Units Subcutaneous QHS   insulin  aspart  0-6 Units Subcutaneous TID WC   insulin  aspart  10 Units Subcutaneous TID WC   sevelamer  carbonate  1,600 mg Oral TID WC   sodium chloride  flush  3 mL Intravenous Q12H   Continuous Infusions:   LOS: 4 days    Time spent: 35 minutes.    Leatrice Chapel, MD  [facility] Pager #: [phone] 7PM-7AM contact night coverage as above

## 2024-03-07 DIAGNOSIS — I442 Atrioventricular block, complete: Secondary | ICD-10-CM | POA: Diagnosis not present

## 2024-03-07 LAB — GLUCOSE, CAPILLARY
Glucose-Capillary: 222 mg/dL — ABNORMAL HIGH (ref 70–99)
Glucose-Capillary: 146 mg/dL — ABNORMAL HIGH (ref 70–99)
Glucose-Capillary: 192 mg/dL — ABNORMAL HIGH (ref 70–99)
Glucose-Capillary: 243 mg/dL — ABNORMAL HIGH (ref 70–99)

## 2024-03-07 MED ORDER — CHLORHEXIDINE GLUCONATE CLOTH 2 % EX PADS
6.0000 | MEDICATED_PAD | Freq: Every day | CUTANEOUS | Status: DC
  Administered 2024-03-08 – 2024-03-10 (×3): 6 via TOPICAL

## 2024-03-07 NOTE — Progress Notes (Addendum)
  Patient Name: [name] Date of Encounter: 03/07/2024  Primary Cardiologist: None Electrophysiologist: New  Interval Summary   No new complaints today. Frustrated for delay. Understands unique nature of leadless pacing.   Vital Signs    Vitals:   03/07/24 0430 03/07/24 0716 03/07/24 1054 03/07/24 1628  BP: (!) 120/93 138/62 109/78 137/60  Pulse: (!) 43 (!) 49 (!) 46 (!) 47  Resp: 19 20 18 20   Temp: 97.6 F (36.4 C) 98 F (36.7 C) 98.1 F (36.7 C) 98.3 F (36.8 C)  TempSrc: Oral Oral Oral Oral  SpO2: 94% 100% 98% 100%  Weight: 85.2 kg     Height:        Intake/Output Summary (Last 24 hours) at 03/07/2024 2029 Last data filed at 03/07/2024 1757 Gross per 24 hour  Intake 941 ml  Output --  Net 941 ml   Filed Weights   03/06/24 0414 03/06/24 1800 03/07/24 0430  Weight: 85.3 kg 84.3 kg 85.2 kg    Physical Exam    GEN- The patient is well appearing, alert and oriented x 3 today.   Lungs- Clear to ausculation bilaterally, normal work of breathing Cardiac- Regular rate and rhythm, no murmurs, rubs or gallops GI- soft, NT, ND, + BS Extremities- no clubbing or cyanosis. No edema  Telemetry    CHB in 30-40s, with atrial rates 80-90s (personally reviewed)  Hospital Course    [name] is a 58 y.o. female with a hx of ESRD on MWF HD, prior CVA, hypertension, diabetes type, ?neuromuscular disorder, anemia chronic kidney disease, asthma who is being seen for the evaluation of complete heart block   Assessment & Plan    CHB Symptomatic bradycardia Without reversible cause.  Given relative young age and mobility, Dual chamber leadless has been recommended.  She is on the schedule for Thursday with Dr. Nancey.    ESRD Will require leadless pacing as above.   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  Volume status per HD.   For questions or updates, please contact [facility] Please consult [email or web address] for contact info under     Signed, Fonda Kitty,  MD  03/07/2024, 8:29 PM    I have seen, examined the patient, and reviewed the above assessment and plan.    Interval:  No acute overnight events. Patient reports feeling relatively well. No new or acute complaints.   Tele - SR with CHB and junctional escape  General: Well developed, in no acute distress.  Neck: No JVD.  Cardiac: Bradycardic, regular rhythm.  Resp: Normal work of breathing.  Ext: No edema.  Neuro: No gross focal deficits.  Psych: Normal affect.   Assessment and Plan:  #. Complete heart block: No reversible cause has been identified. Sinus rates in the 90s. #. Symptomatic bradycardia: - Prior LUE fistula. Right sided tunneled dialysis catheter. Tentatively implant dual chamber leadless pacemaker on Thursday with Dr. Nancey. Will be done under general anesthesia.  - Mildly reduced LVEF on echo. Not sure that risks of biventricular transvenous pacing in setting of dialysis would exceed benefits. Volume status to be managed with dialysis. No clinical HF otherwise.   #. ESRD:  - Continue frequent dialysis to manage electrolytes and volume status. Appreciate nephrology assistance.  Fonda Kitty, MD 03/07/2024 8:29 PM

## 2024-03-07 NOTE — Progress Notes (Addendum)
 [facility] Progress Note  Subjective:  Seen in room Asking not to pull too much fluid on HD, she is cramping  Vitals:   03/07/24 0036 03/07/24 0430 03/07/24 0716 03/07/24 1054  BP: (!) 131/57 (!) 120/93 138/62 109/78  Pulse: (!) 44 (!) 43 (!) 49 (!) 46  Resp: 18 19 20 18   Temp: 97.8 F (36.6 C) 97.6 F (36.4 C) 98 F (36.7 C) 98.1 F (36.7 C)  TempSrc: Oral Oral Oral Oral  SpO2: 100% 94% 100% 98%  Weight:  85.2 kg    Height:        Exam: Gen: well-appearing, no acute distress CV: normal rate, no edema Respiratory: Lateral chest rise, normal work of breathing Gastrointestinal: soft, non-tender, no palpable masses or hernias Skin: no visible lesions or rashes  RIJ TDC intact   OP HD: [facility] [address]  MWF 3h   84.5kg   B400   TDC    Heparin  2000 + 600u/hr    Assessment/ Plan: CHB: work up per [employer]. Cardiology plan is for leadless PPM on Thursday.  ESRD: MWF per schedule. HD tomorrow.  HTN: BP fairly well controlled. No antihypertensive medications on home med list Volume: 1kg up, UF same w/ HD Wed Anemia esrd: Hb 10-11 range, no esa needs. Continue to monitor  Myer Fret MD  [facility] 03/07/2024, 11:57 AM  Recent Labs  Lab 03/05/24 1438 03/05/24 1449 03/06/24 0448 03/06/24 1442  HGB 10.9*  --   --  10.1*  ALBUMIN  --  3.4* 3.2*  --   CALCIUM   --  9.0 8.8*  --   PHOS  --  6.6* 7.8*  --   CREATININE  --  7.83* 8.36*  --   K  --  4.4 4.5  --    No results for input(s): IRON , TIBC, FERRITIN in the last 168 hours. Inpatient medications:  Chlorhexidine  Gluconate Cloth  6 each Topical Daily   Chlorhexidine  Gluconate Cloth  6 each Topical Q0600   insulin  aspart  0-5 Units Subcutaneous QHS   insulin  aspart  0-6 Units Subcutaneous TID WC   insulin  aspart  10 Units Subcutaneous TID WC   sevelamer  carbonate  1,600 mg Oral TID WC   sodium chloride  flush  3 mL Intravenous Q12H    acetaminophen , albuterol ,  guaiFENesin -dextromethorphan , melatonin, polyethylene glycol

## 2024-03-07 NOTE — Progress Notes (Signed)
 Pt receives out-pt HD at [facility] on [address] on MWF 6:30 am chair time. Clinic to fax requested info to hospital for nephrologist to review. Also, navigator advised that clinic can treat pt on Sat at 11:30 am should pt d/c to home on Friday. Clinic staff gave this info to pt this morning when pt called to request appt. Nephrologist made aware of this info as well. Will assist as needed.   Randine Mungo Renal Navigator [phone]

## 2024-03-07 NOTE — Progress Notes (Signed)
 PROGRESS NOTE [name]  FMW:[id number] DOB: Dec 13, 1965 DOA: 03/02/2024 PCP: Pcp, No  Brief Narrative/Hospital Course: 34 yof w/ DM,HTN,nephrolithiasis, neuromuscular disorder, stroke, and anemia and ESRD on HD mwf admitted with symptomatic bradycardia secondary to complete heart block.  EP has been consulted.  Subjective: Seen examined On bedside chair Mild leg swelling Overnight nonsustained V. tach.  Labs stable.  Heart rate in 40s  Assessment and plan:  CHB Symptomatic bradycardia Mildly elevated but flat troponin-likely demand ischemia from CHB: EP cardiology following monitor in telemetry noted potential plan for pacemaker placement on Thursday With ESRD status and right-sided tunneled dialysis catheter in place will need leadless pacer. Abnormal EKG question ST elevation currently troponin flat no chest pain.  Plan per cardiology  ESRD on HD MWF Hypokalemia: Nephrology following to manage fluid status.  Hypertension: BP and volume stable:  Recent pneumonia: Antibiotics completed  Anemia of kidney disease: Hemoglobin stable in 10 g.  Monitor  History of CVA: Not on antiplatelets currently  Diabetes mellitus: A1c 9.8 blood sugar stable on 10 units premeal and SSI  Asthma: Stable   DVT prophylaxis: SCDs Start: 03/02/24 2353 Code Status:   Code Status: Full Code Family Communication: plan of care discussed with patient at bedside. Patient status is: Remains hospitalized because of severity of illness Level of care: Progressive   Dispo: The patient is from: home w/ husband            Anticipated disposition: TBD Objective: Vitals last 24 hrs: Vitals:   03/07/24 0036 03/07/24 0430 03/07/24 0716 03/07/24 1054  BP: (!) 131/57 (!) 120/93 138/62 109/78  Pulse: (!) 44 (!) 43 (!) 49 (!) 46  Resp: 18 19 20 18   Temp: 97.8 F (36.6 C) 97.6 F (36.4 C) 98 F (36.7 C) 98.1 F (36.7 C)  TempSrc: Oral Oral Oral Oral  SpO2: 100% 94% 100% 98%  Weight:  85.2 kg     Height:        Physical Examination: clearGeneral exam: alert awake, older than stated age HEENT:Oral mucosa moist, Ear/Nose WNL grossly Respiratory system: Bilaterally  BS, no use of accessory muscle Cardiovascular system: S1 & S2 +. Gastrointestinal system: Abdomen soft, NT,ND,BS+ Nervous System: Alert, awake, following commands. Extremities: LE edema mild, warm extremities Skin: No rashes,warm. MSK: Normal muscle bulk/tone.  HD cath on rt ant chest  Data Reviewed: I have personally reviewed following labs and imaging studies ( see epic result tab) CBC: Recent Labs  Lab 03/02/24 1310 03/03/24 0204 03/05/24 1438 03/06/24 1442  WBC 7.0 8.2 9.8 9.2  NEUTROABS  --   --  8.1*  --   HGB 10.8* 10.7* 10.9* 10.1*  HCT 34.1* 33.5* 34.4* 32.3*  MCV 104.0* 104.0* 103.6* 105.2*  PLT 218 215 212 181   CMP: Recent Labs  Lab 03/02/24 1310 03/03/24 0204 03/05/24 1438 03/05/24 1449 03/06/24 0448  NA 137 138  --  133* 132*  K 3.2* 3.2*  --  4.4 4.5  CL 93* 96*  --  94* 94*  CO2 24 25  --  22 18*  GLUCOSE 339* 234*  --  247* 208*  BUN 32* 36*  --  39* 44*  CREATININE 6.78* 7.90*  --  7.83* 8.36*  CALCIUM  7.9* 8.0*  --  9.0 8.8*  MG  --  2.0 2.4  --  2.3  PHOS  --  6.2*  --  6.6* 7.8*   GFR: Estimated Creatinine Clearance: 8.1 mL/min (A) (by C-G formula based on SCr of  8.36 mg/dL (H)). Recent Labs  Lab 03/05/24 1449 03/06/24 0448  ALBUMIN 3.4* 3.2*   No results for input(s): LIPASE, AMYLASE in the last 168 hours. No results for input(s): AMMONIA in the last 168 hours. Coagulation Profile: No results for input(s): INR, PROTIME in the last 168 hours. Unresulted Labs (From admission, onward)     Start     Ordered   Signed and Held  Renal function panel  Once,   R        Signed and Held           Antimicrobials/Microbiology: Anti-infectives (From admission, onward)    Start     Dose/Rate Route Frequency Ordered Stop   03/04/24 0830  ceFEPIme  (MAXIPIME ) 1 g  in sodium chloride  0.9 % 100 mL IVPB       Note to Pharmacy: One time dose to complete patients outpatient course she started 7 days ago   1 g 200 mL/hr over 30 Minutes Intravenous  Once 03/04/24 0730 03/04/24 0916         Component Value Date/Time   SDES  11/15/2021 1013    ABSCESS Performed at [facility], [address] [place], [place] [address]    [facility]  11/15/2021 1013    soft tissue left hip wound Performed at [facility], [address] [place], [place] [address]    CULT  11/15/2021 1013    NO GROWTH 2 DAYS Performed at [facility], [address] [place], [place] [address]    REPTSTATUS 11/18/2021 FINAL 11/15/2021 1013    Procedures: Procedure(s) (LRB): PACEMAKER LEADLESS INSERTION (N/A) Medications reviewed:  Scheduled Meds:  Chlorhexidine  Gluconate Cloth  6 each Topical Daily   Chlorhexidine  Gluconate Cloth  6 each Topical Q0600   insulin  aspart  0-5 Units Subcutaneous QHS   insulin  aspart  0-6 Units Subcutaneous TID WC   insulin  aspart  10 Units Subcutaneous TID WC   sevelamer  carbonate  1,600 mg Oral TID WC   sodium chloride  flush  3 mL Intravenous Q12H   Continuous Infusions:  Mennie LAMY, MD [facility] 03/07/2024, 11:23 AM

## 2024-03-07 NOTE — Hospital Course (Addendum)
 76 yof w/ DM,HTN,nephrolithiasis, neuromuscular disorder, stroke, and anemia and ESRD on HD mwf admitted with symptomatic bradycardia secondary to complete heart block.  EP has been consulted> and pacemaker was placed subsequently on 7/10. Patient potassium on higher side but she is refusing dialysis or further treatment here did agree for Lokelma  finally after nephrology discussed with her.  Case discussed with nephrology this morning and at this time okay for discharge home and to have dialysis in the morning   Subjective: seen and examined Wants to go home now. Drank lokelma , does not want HD today and wants to do in am Overnight afebrile BP stable  Labs showed hyperkalemia, temporizing measures were ordered, patient refusing Lokelma  but received insulin  albuterol   Discharge Diagnoses:  CHB s/p leadless pacemaker placement 7/10 Symptomatic bradycardia Mildly elevated but flat troponin-likely demand ischemia from CHB: EP cardiology following s/p  PPM 7/10 Continue to monitor wound, monitor in telemetry.  ESRD on HD MWF Hyperkalemia: Nephrology following to manage fluid status-had HD 7/9-overnight potassium high 6.3 received albuterol  neb and insulin  refused Lokelma , repeat labs refused by patient. Finally did take lokelam, requesting for discharge home now. Advised low potassium diet. I discussed with nephrology who recommends patient can be discharged after Lokelma  and will need to repeat BMP today.  Although I did discuss with the patient but at this time she is not interested for inpatient dialysis and rechecking her potassium despite being aware of risk of hyperkalemia.  She reported that she has had higher potassium level han that in the past and tolerated well and will go for dialysis tomorrow morning.  I did inform her the risk of cardiac arrest with hyperkalemia.  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   Monitor volume status and adjust with dialysis.  Recent pneumonia: Antibiotics completed  Anemia of kidney disease: Hemoglobin stable in 10 g.  Monitor  History of CVA: Not on antiplatelets currently  Diabetes mellitus w/ uncontrolled hyperglycemia: A1c 9.8 blood sugar poorly controlled, on 10 units Premeal> increase to home dose of 14 units cont SSI.  Changed to carb controlled diet Recent Labs  Lab 03/09/24 1910 03/09/24 2226 03/10/24 0554 03/10/24 0805 03/10/24 1045  GLUCAP 212* 359* 393* 345* 283*    Asthma: Stable

## 2024-03-07 NOTE — Plan of Care (Signed)
   Problem: Education: Goal: Knowledge of General Education information will improve Description: Including pain rating scale, medication(s)/side effects and non-pharmacologic comfort measures Outcome: Progressing   Problem: Health Behavior/Discharge Planning: Goal: Ability to manage health-related needs will improve Outcome: Progressing   Problem: Clinical Measurements: Goal: Will remain free from infection Outcome: Progressing

## 2024-03-08 DIAGNOSIS — I442 Atrioventricular block, complete: Secondary | ICD-10-CM | POA: Diagnosis not present

## 2024-03-08 LAB — GLUCOSE, CAPILLARY
Glucose-Capillary: 206 mg/dL — ABNORMAL HIGH (ref 70–99)
Glucose-Capillary: 182 mg/dL — ABNORMAL HIGH (ref 70–99)
Glucose-Capillary: 244 mg/dL — ABNORMAL HIGH (ref 70–99)
Glucose-Capillary: 104 mg/dL — ABNORMAL HIGH (ref 70–99)

## 2024-03-08 LAB — CBC
HCT: 33.3 % — ABNORMAL LOW (ref 36.0–46.0)
Hemoglobin: 10.2 g/dL — ABNORMAL LOW (ref 12.0–15.0)
MCH: 32.4 pg (ref 26.0–34.0)
MCHC: 30.6 g/dL (ref 30.0–36.0)
MCV: 105.7 fL — ABNORMAL HIGH (ref 80.0–100.0)
Platelets: 155 K/uL (ref 150–400)
RBC: 3.15 MIL/uL — ABNORMAL LOW (ref 3.87–5.11)
RDW: 15.7 % — ABNORMAL HIGH (ref 11.5–15.5)
WBC: 8.7 K/uL (ref 4.0–10.5)
nRBC: 0 % (ref 0.0–0.2)

## 2024-03-08 LAB — RENAL FUNCTION PANEL
Albumin: 3.1 g/dL — ABNORMAL LOW (ref 3.5–5.0)
Anion gap: 15 (ref 5–15)
BUN: 41 mg/dL — ABNORMAL HIGH (ref 6–20)
CO2: 24 mmol/L (ref 22–32)
Calcium: 9.1 mg/dL (ref 8.9–10.3)
Chloride: 97 mmol/L — ABNORMAL LOW (ref 98–111)
Creatinine, Ser: 7.66 mg/dL — ABNORMAL HIGH (ref 0.44–1.00)
GFR, Estimated: 6 mL/min — ABNORMAL LOW (ref >60)
Glucose, Bld: 216 mg/dL — ABNORMAL HIGH (ref 70–99)
Phosphorus: 6.3 mg/dL — ABNORMAL HIGH (ref 2.5–4.6)
Potassium: 4.8 mmol/L (ref 3.5–5.1)
Sodium: 136 mmol/L (ref 135–145)

## 2024-03-08 MED ORDER — CEFAZOLIN SODIUM-DEXTROSE 2-4 GM/100ML-% IV SOLN
2.0000 g | INTRAVENOUS | Status: AC
  Administered 2024-03-09: 2 g via INTRAVENOUS
  Filled 2024-03-08: qty 100

## 2024-03-08 MED ORDER — LIDOCAINE-PRILOCAINE 2.5-2.5 % EX CREA: 1.0000 | TOPICAL_CREAM | CUTANEOUS | Status: DC | PRN

## 2024-03-08 MED ORDER — SODIUM CHLORIDE 0.9% FLUSH
3.0000 mL | Freq: Two times a day (BID) | INTRAVENOUS | Status: DC
  Administered 2024-03-08: 3 mL via INTRAVENOUS

## 2024-03-08 MED ORDER — HEPARIN SODIUM (PORCINE) 1000 UNIT/ML DIALYSIS
2000.0000 [IU] | Freq: Once | INTRAMUSCULAR | Status: AC
  Administered 2024-03-08: 2000 [IU] via INTRAVENOUS_CENTRAL

## 2024-03-08 MED ORDER — HEPARIN SODIUM (PORCINE) 1000 UNIT/ML IJ SOLN
INTRAMUSCULAR | Status: AC
  Filled 2024-03-08: qty 4

## 2024-03-08 MED ORDER — ANTICOAGULANT SODIUM CITRATE 4% (200MG/5ML) IV SOLN: 5.0000 mL | Status: DC | PRN

## 2024-03-08 MED ORDER — SODIUM CHLORIDE 0.9% FLUSH: 3.0000 mL | INTRAVENOUS | Status: DC | PRN

## 2024-03-08 MED ORDER — ALTEPLASE 2 MG IJ SOLR
2.0000 mg | Freq: Once | INTRAMUSCULAR | Status: DC | PRN
Start: 2024-03-08 — End: 2024-03-08

## 2024-03-08 MED ORDER — NEPRO/CARBSTEADY PO LIQD: 237.0000 mL | ORAL | Status: DC | PRN

## 2024-03-08 MED ORDER — PENTAFLUOROPROP-TETRAFLUOROETH EX AERO: 1.0000 | INHALATION_SPRAY | CUTANEOUS | Status: DC | PRN

## 2024-03-08 MED ORDER — HEPARIN SODIUM (PORCINE) 1000 UNIT/ML IJ SOLN
INTRAMUSCULAR | Status: AC
Start: 2024-03-08 — End: 2024-03-08
  Filled 2024-03-08: qty 1

## 2024-03-08 MED ORDER — SODIUM CHLORIDE 0.9 % IV SOLN: INTRAVENOUS | Status: DC

## 2024-03-08 MED ORDER — SODIUM CHLORIDE 0.9 % IV SOLN: 250.0000 mL | INTRAVENOUS | Status: DC | PRN

## 2024-03-08 MED ORDER — LIDOCAINE HCL (PF) 1 % IJ SOLN: 5.0000 mL | INTRAMUSCULAR | Status: DC | PRN

## 2024-03-08 MED ORDER — HEPARIN SODIUM (PORCINE) 1000 UNIT/ML DIALYSIS
1000.0000 [IU] | INTRAMUSCULAR | Status: DC | PRN
  Administered 2024-03-08: 3200 [IU]

## 2024-03-08 MED ORDER — HEPARIN SODIUM (PORCINE) 1000 UNIT/ML DIALYSIS
1500.0000 [IU] | INTRAMUSCULAR | Status: AC | PRN
Start: 2024-03-09 — End: 2024-03-08
  Administered 2024-03-08: 1500 [IU] via INTRAVENOUS_CENTRAL

## 2024-03-08 NOTE — Progress Notes (Signed)
  [facility] Progress Note   Subjective: Seen in [facility]. No complaints this am. Denies chest pain/sob.   Objective Vitals:   03/08/24 0423 03/08/24 0739 03/08/24 0821 03/08/24 0838  BP: (!) 153/64 (!) 148/67 129/62 123/62  Pulse:  (!) 43 (!) 49 (!) 40  Resp: 20 20 17 14   Temp: 97.6 F (36.4 C) (!) 97.5 F (36.4 C) 97.9 F (36.6 C)   TempSrc: Oral Oral    SpO2: 96% 94%  100%  Weight: 86.5 kg  86.5 kg   Height:        86.5 kg  Additional Objective Labs: Basic Metabolic Panel: Recent Labs  Lab 03/03/24 0204 03/05/24 1449 03/06/24 0448  NA 138 133* 132*  K 3.2* 4.4 4.5  CL 96* 94* 94*  CO2 25 22 18*  GLUCOSE 234* 247* 208*  BUN 36* 39* 44*  CREATININE 7.90* 7.83* 8.36*  CALCIUM  8.0* 9.0 8.8*  PHOS 6.2* 6.6* 7.8*   CBC: Recent Labs  Lab 03/02/24 1310 03/03/24 0204 03/05/24 1438 03/06/24 1442 03/08/24 0703  WBC 7.0 8.2 9.8 9.2 8.7  NEUTROABS  --   --  8.1*  --   --   HGB 10.8* 10.7* 10.9* 10.1* 10.2*  HCT 34.1* 33.5* 34.4* 32.3* 33.3*  MCV 104.0* 104.0* 103.6* 105.2* 105.7*  PLT 218 215 212 181 155   Blood Culture    Component Value Date/Time   SDES  11/15/2021 1013    ABSCESS Performed at [facility], [address], [place], [place] [address]    [facility]  11/15/2021 1013    soft tissue left hip wound Performed at [facility], [address] [place], [place] [address]    CULT  11/15/2021 1013    NO GROWTH 2 DAYS Performed at [facility], [address] [place], [place] [address]    REPTSTATUS 11/18/2021 FINAL 11/15/2021 1013     Physical Exam General: Well appearing, nad  Heart: [place]  Lungs: Clear, through out  Abdomen: non tender  Extremities: No LE edema  Dialysis Access: [facility]  Medications:  anticoagulant sodium citrate       Chlorhexidine  Gluconate Cloth  6 each Topical Daily   Chlorhexidine  Gluconate Cloth  6 each Topical Q0600   Chlorhexidine  Gluconate Cloth  6 each Topical Q0600    insulin  aspart  0-5 Units Subcutaneous QHS   insulin  aspart  0-6 Units Subcutaneous TID WC   insulin  aspart  10 Units Subcutaneous TID WC   sevelamer  carbonate  1,600 mg Oral TID WC   sodium chloride  flush  3 mL Intravenous Q12H    Dialysis Orders:  [facility] [address]  MWF 3h   84.5kg   B400   TDC    Heparin  2000 + 600u/hr  Assessment/Plan: CHB: work up per [employer]. Cardiology plan is for leadless PPM on Thursday.  ESRD: MWF per schedule. HD today  HTN: BP fairly well controlled. No antihypertensive medications on home med list Volume: UF to EDW as tolerated  Anemia esrd: Hb 10-11 range, no esa needs. 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 Continue sevelamer  binder   Maisie Ronnald Acosta PA-C [facility] 03/08/2024,8:53 AM

## 2024-03-08 NOTE — Procedures (Signed)
 Received patient in bed to unit.  Alert and oriented.  Informed consent signed and in chart.   TX duration: 3 hours  Patient tolerated well.  Transported back to the room  Alert, without acute distress.  Hand-off given to patient's nurse.   Access used: right cath Access issues: none  Total UF removed: 2 liters Medication(s) given: heparin    Greig Silvan, RN [facility]

## 2024-03-08 NOTE — Procedures (Signed)
 Goal increased to 2 liters per Dr. Geralynn orders.

## 2024-03-08 NOTE — Progress Notes (Signed)
  Patient Name: [name] Date of Encounter: 03/08/2024  Primary Cardiologist: None Electrophysiologist: None  Interval Summary   The patient is doing well today.  At this time, the patient denies chest pain, shortness of breath, or any new concerns.  Vital Signs    Vitals:   03/07/24 2029 03/07/24 2358 03/08/24 0423 03/08/24 0739  BP: (!) 160/70 (!) 126/49 (!) 153/64 (!) 148/67  Pulse: (!) 45   (!) 43  Resp: 20 20 20 20   Temp: 97.8 F (36.6 C) 97.6 F (36.4 C) 97.6 F (36.4 C) (!) 97.5 F (36.4 C)  TempSrc: Oral Oral Oral Oral  SpO2: 100% 94% 96% 94%  Weight:   86.5 kg   Height:        Intake/Output Summary (Last 24 hours) at 03/08/2024 0745 Last data filed at 03/08/2024 0600 Gross per 24 hour  Intake 701 ml  Output --  Net 701 ml   Filed Weights   03/06/24 1800 03/07/24 0430 03/08/24 0423  Weight: 84.3 kg 85.2 kg 86.5 kg    Physical Exam    GEN- NAD, alert and oriented x 3 today.   Lungs- Clear to ausculation bilaterally, normal work of breathing Cardiac- Regular rate and rhythm, no murmurs, rubs or gallops GI- soft, NT, ND, + BS Extremities- no clubbing or cyanosis. No edema  Telemetry    CHB with HRs in 40s (personally reviewed)  Hospital Course    [name] is a 58 y.o. female with a hx of ESRD on MWF HD, prior CVA, hypertension, diabetes type, ?neuromuscular disorder, anemia chronic kidney disease, asthma who is being seen for the evaluation of complete heart block   Assessment & Plan    CHB Symptomatic bradycardia Without reversible cause.  Given relative young age and mobility, Dual chamber leadless has been recommended.  She is on the schedule for Tomorrow with Dr. Nancey.   Explained risks, benefits, and alternatives to Leadless PPM implantation, including but not limited to bleeding, infection, pneumothorax, pericardial effusion, heart attack, stroke, or death.  Pt verbalized understanding and agrees to proceed.       ESRD Will require  leadless pacing as above.    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  Volume status per HD.   For questions or updates, please contact [facility] Please consult [email or web address] for contact info under     Signed, Ozell Prentice Passey, PA-C  03/08/2024, 7:45 AM

## 2024-03-08 NOTE — TOC Progression Note (Signed)
 Transition of Care [facility]) - Progression Note    Patient Details  Name: [name] MRN: [id number] Date of Birth: 1966/04/11  Transition of Care [facility]) CM/SW Contact  Waddell Barnie Rama, RN Phone Number: 03/08/2024, 3:34 PM  Clinical Narrative:    For pacemaker tomorrow, and then plan for dc on Friday,  her spouse will be transporting her home at dc.          Expected Discharge Plan and Services                                               Social Determinants of Health (SDOH) Interventions SDOH Screenings   Food Insecurity: No Food Insecurity (03/02/2024)  Housing: Patient Declined (03/06/2024)  Transportation Needs: No Transportation Needs (03/02/2024)  Utilities: Not At Risk (03/02/2024)  Alcohol Screen: Low Risk  (04/04/2019)  Depression (PHQ2-9): Low Risk  (04/04/2019)  Financial Resource Strain: Low Risk  (03/07/2018)  Physical Activity: Unknown (01/03/2019)  Social Connections: Moderately Integrated (03/07/2018)  Stress: No Stress Concern Present (03/07/2018)  Tobacco Use: Low Risk  (03/02/2024)    Readmission Risk Interventions    03/03/2024    3:31 PM  Readmission Risk Prevention Plan  Transportation Screening Complete  PCP or Specialist Appt within 5-7 Days --  Home Care Screening Complete  Medication Review (RN CM) Complete

## 2024-03-08 NOTE — Progress Notes (Signed)
 PROGRESS NOTE [name]  FMW:[id number] DOB: October 12, 1965 DOA: 03/02/2024 PCP: Pcp, No  Brief Narrative/Hospital Course: 68 yof w/ DM,HTN,nephrolithiasis, neuromuscular disorder, stroke, and anemia and ESRD on HD mwf admitted with symptomatic bradycardia secondary to complete heart block.  EP has been consulted.  Subjective: Seen and examined this morning in HD Doing well, no complaints Overnight heart rate in 40s but BP stable, CBC stable, patient getting HD this morning    Assessment and plan:  CHB Symptomatic bradycardia Mildly elevated but flat troponin-likely demand ischemia from CHB: EP cardiology following monitoring in telemetry and planning for pacemaker placement 7/10 keep n.p.o. past midnight With ESRD status and right-sided tunneled dialysis catheter in place will need leadless pacer. Abnormal EKG question ST elevation currently troponin flat no chest pain.  Plan per cardiology  ESRD on HD MWF Hypokalemia: Nephrology following to manage fluid status-during HD 7/9 before pacemaker placement tomorrow.  Hypertension HFmrEF w/ EF 40-45% Mitral regurgitation possible severe, but accuracy limited by CHB: BP and volume stable, cont HD:  Recent pneumonia: Antibiotics completed  Anemia of kidney disease: Hemoglobin stable in 10 g.  Monitor  History of CVA: Not on antiplatelets currently  Diabetes mellitus w/ uncontrolled hyperglycemia: A1c 9.8 blood sugar stable on 10 units premeal and SSI Recent Labs  Lab 03/03/24 0204 03/03/24 0601 03/07/24 0619 03/07/24 1052 03/07/24 1625 03/07/24 2049 03/08/24 0613  GLUCAP  --    < > 222* 146* 192* 243* 206*  HGBA1C 9.8*  --   --   --   --   --   --    < > = values in this interval not displayed.    Asthma: Stable   DVT prophylaxis: SCDs Start: 03/02/24 2353 Code Status:   Code Status: Full Code Family Communication: plan of care discussed with patient at bedside. Patient status is: Remains hospitalized because of  severity of illness Level of care: Progressive   Dispo: The patient is from: home w/ husband            Anticipated disposition: TBD Objective: Vitals last 24 hrs: Vitals:   03/08/24 0900 03/08/24 0930 03/08/24 1000 03/08/24 1030  BP: 133/61 (!) 147/76 121/60 (!) 132/55  Pulse: (!) 49 (!) 44 (!) 40 (!) 42  Resp: (!) 22 17 20 18   Temp:      TempSrc:      SpO2: 91% 98% 100% 96%  Weight:      Height:        Physical Examination: General exam: alert awake, oriented  HEENT:Oral mucosa moist, Ear/Nose WNL grossly Respiratory system: Bilaterally clear BS,no use of accessory muscle Cardiovascular system: S1 & S2 +, No JVD. Gastrointestinal system: Abdomen soft,NT,ND, BS+ Nervous System: Alert, awake, moving all extremities,and following commands. Extremities: LE edema neg,distal peripheral pulses palpable and warm.  Skin: No rashes,no icterus. MSK: Normal muscle bulk,tone, power  Hd cath on chest  Data Reviewed: I have personally reviewed following labs and imaging studies ( see epic result tab) CBC: Recent Labs  Lab 03/02/24 1310 03/03/24 0204 03/05/24 1438 03/06/24 1442 03/08/24 0703  WBC 7.0 8.2 9.8 9.2 8.7  NEUTROABS  --   --  8.1*  --   --   HGB 10.8* 10.7* 10.9* 10.1* 10.2*  HCT 34.1* 33.5* 34.4* 32.3* 33.3*  MCV 104.0* 104.0* 103.6* 105.2* 105.7*  PLT 218 215 212 181 155   CMP: Recent Labs  Lab 03/02/24 1310 03/03/24 0204 03/05/24 1438 03/05/24 1449 03/06/24 0448 03/08/24 0703  NA 137 138  --  133* 132* 136  K 3.2* 3.2*  --  4.4 4.5 4.8  CL 93* 96*  --  94* 94* 97*  CO2 24 25  --  22 18* 24  GLUCOSE 339* 234*  --  247* 208* 216*  BUN 32* 36*  --  39* 44* 41*  CREATININE 6.78* 7.90*  --  7.83* 8.36* 7.66*  CALCIUM  7.9* 8.0*  --  9.0 8.8* 9.1  MG  --  2.0 2.4  --  2.3  --   PHOS  --  6.2*  --  6.6* 7.8* 6.3*   GFR: Estimated Creatinine Clearance: 8.9 mL/min (A) (by C-G formula based on SCr of 7.66 mg/dL (H)). Recent Labs  Lab 03/05/24 1449  03/06/24 0448 03/08/24 0703  ALBUMIN 3.4* 3.2* 3.1*   No results for input(s): LIPASE, AMYLASE in the last 168 hours. No results for input(s): AMMONIA in the last 168 hours. Coagulation Profile: No results for input(s): INR, PROTIME in the last 168 hours. Unresulted Labs (From admission, onward)     Start     Ordered   Signed and Held  Renal function panel  Once,   R        Signed and Held           Antimicrobials/Microbiology: Anti-infectives (From admission, onward)    Start     Dose/Rate Route Frequency Ordered Stop   03/04/24 0830  ceFEPIme  (MAXIPIME ) 1 g in sodium chloride  0.9 % 100 mL IVPB       Note to Pharmacy: One time dose to complete patients outpatient course she started 7 days ago   1 g 200 mL/hr over 30 Minutes Intravenous  Once 03/04/24 0730 03/04/24 0916         Component Value Date/Time   SDES  11/15/2021 1013    ABSCESS Performed at [facility], [address], [place], [place] [address]    [facility]  11/15/2021 1013    soft tissue left hip wound Performed at [facility], [address] [place], [place] [address]    CULT  11/15/2021 1013    NO GROWTH 2 DAYS Performed at [facility], [address] [place], [place] [address]    REPTSTATUS 11/18/2021 FINAL 11/15/2021 1013    Procedures: Procedure(s) (LRB): PACEMAKER LEADLESS INSERTION (N/A) Medications reviewed:  Scheduled Meds:  Chlorhexidine  Gluconate Cloth  6 each Topical Daily   Chlorhexidine  Gluconate Cloth  6 each Topical Q0600   Chlorhexidine  Gluconate Cloth  6 each Topical Q0600   insulin  aspart  0-5 Units Subcutaneous QHS   insulin  aspart  0-6 Units Subcutaneous TID WC   insulin  aspart  10 Units Subcutaneous TID WC   sevelamer  carbonate  1,600 mg Oral TID WC   sodium chloride  flush  3 mL Intravenous Q12H   Continuous Infusions:  anticoagulant sodium citrate       Mennie LAMY, MD [facility] 03/08/2024, 10:59 AM

## 2024-03-09 ENCOUNTER — Inpatient Hospital Stay (HOSPITAL_COMMUNITY): Payer: Self-pay | Admitting: Certified Registered"

## 2024-03-09 ENCOUNTER — Encounter (HOSPITAL_COMMUNITY): Payer: Self-pay | Admitting: Cardiovascular Disease

## 2024-03-09 ENCOUNTER — Encounter (HOSPITAL_COMMUNITY)
Disposition: A | Discharge status: Home / Self Care | Payer: Self-pay | Source: Other Acute Inpatient Hospital | Attending: Internal Medicine

## 2024-03-09 DIAGNOSIS — I441 Atrioventricular block, second degree: Secondary | ICD-10-CM

## 2024-03-09 DIAGNOSIS — Z006 Encounter for examination for normal comparison and control in clinical research program: Secondary | ICD-10-CM

## 2024-03-09 DIAGNOSIS — I442 Atrioventricular block, complete: Secondary | ICD-10-CM | POA: Diagnosis not present

## 2024-03-09 HISTORY — PX: PACEMAKER LEADLESS INSERTION: EP1219

## 2024-03-09 LAB — GLUCOSE, CAPILLARY
Glucose-Capillary: 183 mg/dL — ABNORMAL HIGH (ref 70–99)
Glucose-Capillary: 188 mg/dL — ABNORMAL HIGH (ref 70–99)
Glucose-Capillary: 212 mg/dL — ABNORMAL HIGH (ref 70–99)
Glucose-Capillary: 359 mg/dL — ABNORMAL HIGH (ref 70–99)

## 2024-03-09 LAB — POCT I-STAT, CHEM 8
BUN: 25 mg/dL — ABNORMAL HIGH (ref 6–20)
Calcium, Ion: 1 mmol/L — ABNORMAL LOW (ref 1.15–1.40)
Chloride: 106 mmol/L (ref 98–111)
Creatinine, Ser: 4.8 mg/dL — ABNORMAL HIGH (ref 0.44–1.00)
Glucose, Bld: 146 mg/dL — ABNORMAL HIGH (ref 70–99)
HCT: 28 % — ABNORMAL LOW (ref 36.0–46.0)
Hemoglobin: 9.5 g/dL — ABNORMAL LOW (ref 12.0–15.0)
Potassium: 4.3 mmol/L (ref 3.5–5.1)
Sodium: 140 mmol/L (ref 135–145)
TCO2: 20 mmol/L — ABNORMAL LOW (ref 22–32)

## 2024-03-09 MED ORDER — PROPOFOL 10 MG/ML IV BOLUS
INTRAVENOUS | Status: DC | PRN
  Administered 2024-03-09: 130 mg via INTRAVENOUS

## 2024-03-09 MED ORDER — SODIUM CHLORIDE 0.9 % IV SOLN: 250.0000 mL | INTRAVENOUS | Status: DC | PRN

## 2024-03-09 MED ORDER — SODIUM CHLORIDE 0.9% FLUSH
3.0000 mL | Freq: Two times a day (BID) | INTRAVENOUS | Status: DC
Start: 2024-03-09 — End: 2024-03-10
  Administered 2024-03-09 – 2024-03-10 (×2): 3 mL via INTRAVENOUS

## 2024-03-09 MED ORDER — ONDANSETRON HCL 4 MG/2ML IJ SOLN: 4.0000 mg | Freq: Four times a day (QID) | INTRAMUSCULAR | Status: DC | PRN

## 2024-03-09 MED ORDER — SUGAMMADEX SODIUM 200 MG/2ML IV SOLN
INTRAVENOUS | Status: DC | PRN
  Administered 2024-03-09: 200 mg via INTRAVENOUS

## 2024-03-09 MED ORDER — SODIUM CHLORIDE 0.9% FLUSH: 3.0000 mL | INTRAVENOUS | Status: DC | PRN

## 2024-03-09 MED ORDER — CEFAZOLIN SODIUM-DEXTROSE 2-4 GM/100ML-% IV SOLN
INTRAVENOUS | Status: AC
  Filled 2024-03-09: qty 100

## 2024-03-09 MED ORDER — DIPHENHYDRAMINE HCL 50 MG/ML IJ SOLN
INTRAMUSCULAR | Status: AC
  Filled 2024-03-09: qty 1

## 2024-03-09 MED ORDER — METHYLPREDNISOLONE SODIUM SUCC 125 MG IJ SOLR
125.0000 mg | Freq: Once | INTRAMUSCULAR | Status: AC
  Administered 2024-03-09: 125 mg via INTRAVENOUS

## 2024-03-09 MED ORDER — ROCURONIUM BROMIDE 10 MG/ML (PF) SYRINGE
PREFILLED_SYRINGE | INTRAVENOUS | Status: DC | PRN
  Administered 2024-03-09: 60 mg via INTRAVENOUS

## 2024-03-09 MED ORDER — METHYLPREDNISOLONE SODIUM SUCC 125 MG IJ SOLR
INTRAMUSCULAR | Status: AC
  Filled 2024-03-09: qty 2

## 2024-03-09 MED ORDER — DIPHENHYDRAMINE HCL 50 MG/ML IJ SOLN
25.0000 mg | Freq: Once | INTRAMUSCULAR | Status: AC
  Administered 2024-03-09: 25 mg via INTRAVENOUS

## 2024-03-09 MED ORDER — FENTANYL CITRATE (PF) 250 MCG/5ML IJ SOLN
INTRAMUSCULAR | Status: DC | PRN
  Administered 2024-03-09 (×2): 50 ug via INTRAVENOUS

## 2024-03-09 MED ORDER — IOHEXOL 350 MG/ML SOLN
INTRAVENOUS | Status: DC | PRN
  Administered 2024-03-09: 10 mL

## 2024-03-09 MED ORDER — EPHEDRINE SULFATE-NACL 50-0.9 MG/10ML-% IV SOSY
PREFILLED_SYRINGE | INTRAVENOUS | Status: DC | PRN
  Administered 2024-03-09: 15 mg via INTRAVENOUS

## 2024-03-09 MED ORDER — INSULIN ASPART 100 UNIT/ML IJ SOLN
INTRAMUSCULAR | Status: AC
  Administered 2024-03-09: 2 [IU]
  Filled 2024-03-09: qty 1

## 2024-03-09 MED ORDER — HEPARIN SODIUM (PORCINE) 1000 UNIT/ML IJ SOLN
INTRAMUSCULAR | Status: DC | PRN
  Administered 2024-03-09: 3000 [IU] via INTRAVENOUS

## 2024-03-09 MED ORDER — FENTANYL CITRATE (PF) 100 MCG/2ML IJ SOLN
INTRAMUSCULAR | Status: AC
  Filled 2024-03-09: qty 2

## 2024-03-09 MED ORDER — PHENYLEPHRINE HCL-NACL 20-0.9 MG/250ML-% IV SOLN
INTRAVENOUS | Status: DC | PRN
  Administered 2024-03-09: 25 ug/min via INTRAVENOUS

## 2024-03-09 MED ORDER — ONDANSETRON HCL 4 MG/2ML IJ SOLN
INTRAMUSCULAR | Status: DC | PRN
  Administered 2024-03-09: 4 mg via INTRAVENOUS

## 2024-03-09 MED ORDER — LIDOCAINE 2% (20 MG/ML) 5 ML SYRINGE
INTRAMUSCULAR | Status: DC | PRN
  Administered 2024-03-09: 40 mg via INTRAVENOUS

## 2024-03-09 MED ORDER — PHENYLEPHRINE 80 MCG/ML (10ML) SYRINGE FOR IV PUSH (FOR BLOOD PRESSURE SUPPORT)
PREFILLED_SYRINGE | INTRAVENOUS | Status: DC | PRN
  Administered 2024-03-09: 160 ug via INTRAVENOUS

## 2024-03-09 NOTE — Progress Notes (Signed)
  [facility] Progress Note   Subjective:   Seen in room. No issues with dialysis yesterday. No cp, sob. For pacemaker placement today   Objective Vitals:   03/08/24 2031 03/09/24 0530 03/09/24 0532 03/09/24 0809  BP: (!) 141/64  133/60 (!) 147/71  Pulse: (!) 47  (!) 42   Resp: 18  18 20   Temp: 98.1 F (36.7 C)  97.7 F (36.5 C) 97.8 F (36.6 C)  TempSrc: Oral  Oral   SpO2: 96%  90% 94%  Weight:  88.5 kg    Height:         Additional Objective Labs: Basic Metabolic Panel: Recent Labs  Lab 03/05/24 1449 03/06/24 0448 03/08/24 0703  NA 133* 132* 136  K 4.4 4.5 4.8  CL 94* 94* 97*  CO2 22 18* 24  GLUCOSE 247* 208* 216*  BUN 39* 44* 41*  CREATININE 7.83* 8.36* 7.66*  CALCIUM  9.0 8.8* 9.1  PHOS 6.6* 7.8* 6.3*   CBC: Recent Labs  Lab 03/02/24 1310 03/03/24 0204 03/05/24 1438 03/06/24 1442 03/08/24 0703  WBC 7.0 8.2 9.8 9.2 8.7  NEUTROABS  --   --  8.1*  --   --   HGB 10.8* 10.7* 10.9* 10.1* 10.2*  HCT 34.1* 33.5* 34.4* 32.3* 33.3*  MCV 104.0* 104.0* 103.6* 105.2* 105.7*  PLT 218 215 212 181 155   Blood Culture    Component Value Date/Time   SDES  11/15/2021 1013    ABSCESS Performed at [facility], [address] [place], [place] [address]    [facility]  11/15/2021 1013    soft tissue left hip wound Performed at [facility], [address] [place], [place] [address]    CULT  11/15/2021 1013    NO GROWTH 2 DAYS Performed at [facility], [address], [place], [place] [address]    REPTSTATUS 11/18/2021 FINAL 11/15/2021 1013     Physical Exam General: Well appearing, nad  Heart: [place]  Lungs: Clear, through out  Abdomen: non tender  Extremities: No LE edema  Dialysis Access: [facility]  Medications:  sodium chloride      sodium chloride  20 mL/hr at 03/09/24 9380    ceFAZolin  (ANCEF ) IV      Chlorhexidine  Gluconate Cloth  6 each Topical Daily   Chlorhexidine  Gluconate Cloth  6 each Topical Q0600    Chlorhexidine  Gluconate Cloth  6 each Topical Q0600   insulin  aspart  0-5 Units Subcutaneous QHS   insulin  aspart  0-6 Units Subcutaneous TID WC   insulin  aspart  10 Units Subcutaneous TID WC   sevelamer  carbonate  1,600 mg Oral TID WC   sodium chloride  flush  3 mL Intravenous Q12H   sodium chloride  flush  3 mL Intravenous Q12H    Dialysis Orders:  [facility] [address]  MWF 3h   84.5kg   B400   TDC    Heparin  2000 + 600u/hr  Assessment/Plan: CHB: work up per [employer]. Cardiology plan is for leadless PPM on Thursday.  ESRD: MWF per schedule. HTN: BP fairly well controlled. No antihypertensive medications on home med list Volume: UF to EDW as tolerated  Anemia esrd: Hb 10-11 range, no esa needs. 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 Continue sevelamer  binder  Dispo: Ok for discharge from renal standpoint after procedure.   Maisie Ronnald Acosta PA-C [facility] 03/09/2024,8:49 AM

## 2024-03-09 NOTE — Plan of Care (Signed)

## 2024-03-09 NOTE — Anesthesia Procedure Notes (Signed)
 Procedure Name: Intubation Date/Time: 03/09/2024 5:38 PM  Performed by: Delores Dus, CRNAPre-anesthesia Checklist: Patient identified, Emergency Drugs available, Suction available and Patient being monitored Patient Re-evaluated:Patient Re-evaluated prior to induction Oxygen Delivery Method: Circle system utilized Preoxygenation: Pre-oxygenation with 100% oxygen Induction Type: IV induction Ventilation: Mask ventilation without difficulty Laryngoscope Size: Miller and 2 Grade View: Grade I Tube type: Oral Tube size: 7.0 mm Number of attempts: 1 Airway Equipment and Method: Stylet and Oral airway Placement Confirmation: ETT inserted through vocal cords under direct vision, positive ETCO2 and breath sounds checked- equal and bilateral Secured at: 21 cm Tube secured with: Tape Dental Injury: Teeth and Oropharynx as per pre-operative assessment

## 2024-03-09 NOTE — Discharge Instructions (Signed)

## 2024-03-09 NOTE — Progress Notes (Signed)
  Patient Name: [name] Date of Encounter: 03/09/2024  Primary Cardiologist: None Electrophysiologist: None  Interval Summary   NAEO. Anxious to get procedure over with.   Vital Signs    Vitals:   03/08/24 1623 03/08/24 2031 03/09/24 0530 03/09/24 0532  BP: 119/62 (!) 141/64  133/60  Pulse: (!) 46 (!) 47  (!) 42  Resp: 20 18  18   Temp: 98.1 F (36.7 C) 98.1 F (36.7 C)  97.7 F (36.5 C)  TempSrc: Oral Oral  Oral  SpO2: 94% 96%  90%  Weight:   88.5 kg   Height:        Intake/Output Summary (Last 24 hours) at 03/09/2024 0802 Last data filed at 03/08/2024 1900 Gross per 24 hour  Intake 120 ml  Output 2000 ml  Net -1880 ml   Filed Weights   03/08/24 0423 03/08/24 0821 03/09/24 0530  Weight: 86.5 kg 86.5 kg 88.5 kg    Physical Exam    GEN- NAD, alert and oriented Lungs- Clear to ausculation bilaterally, normal work of breathing Cardiac- Slow but regular rate and rhythm, no murmurs, rubs or gallops GI- soft, NT, ND, + BS Extremities- no clubbing or cyanosis. No edema  Telemetry    CHB with HRs in 40s (personally reviewed)  Hospital Course    [name] is a 58 y.o. female with a hx of ESRD on MWF HD, prior CVA, hypertension, diabetes type, ?neuromuscular disorder, anemia chronic kidney disease, asthma who is being seen for the evaluation of complete heart block   Assessment & Plan    CHB Symptomatic bradycardia Without reversible cause.  Given relative young age and mobility, Dual chamber leadless has been recommended.  She is on the schedule for today with Dr. Nancey.  We have explained risks, benefits, and alternatives to Leadless PPM implantation, including but not limited to bleeding, infection, pneumothorax, pericardial effusion, heart attack, stroke, or death.  Pt verbalized understanding and remains agreeable to proceed.    ESRD Will require leadless pacing as above.    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  Volume status per HD.   For questions or updates, please contact [facility] Please consult [email or web address] for contact info under    Signed, Ozell Prentice Passey, PA-C  03/09/2024, 8:02 AM

## 2024-03-09 NOTE — Anesthesia Preprocedure Evaluation (Signed)
 Anesthesia Evaluation  Patient identified by MRN, date of birth, ID band Patient awake    Reviewed: Allergy & Precautions, NPO status , Patient's Chart, lab work & pertinent test results  History of Anesthesia Complications (+) PONV and history of anesthetic complications  Airway Mallampati: III  TM Distance: >3 FB Neck ROM: full    Dental  (+) Chipped, Dental Advidsory Given   Pulmonary neg shortness of breath, asthma , neg sleep apnea, neg recent URI    + decreased breath sounds      Cardiovascular hypertension, Pt. on medications (-) angina (-) Past MI and (-) Cardiac Stents Normal cardiovascular exam+ dysrhythmias (-) Valvular Problems/Murmurs Rhythm:Regular Rate:Normal - Systolic murmurs    Neuro/Psych neg Seizures PSYCHIATRIC DISORDERS Anxiety      Neuromuscular disease CVA, No Residual Symptoms    GI/Hepatic Neg liver ROS,GERD  Controlled,,  Endo/Other  diabetes, Type 2    Renal/GU DialysisRenal diseaseLast dialyzes yesterday     Musculoskeletal   Abdominal   Peds  Hematology negative hematology ROS (+)   Anesthesia Other Findings Past Medical History: No date: Anemia     Comment:  vitamin d3 deficiency No date: Anxiety No date: Chronic kidney disease     Comment:  End Stage Renal Disease No date: Diabetes mellitus without complication (HCC) No date: GERD (gastroesophageal reflux disease)     Comment:  nothing over last few years No date: High serum parathyroid hormone (PTH)     Comment:  checked through Dialysis 2000: History of kidney stones No date: Hypertension No date: Neuromuscular disorder (HCC)     Comment:  neuropathy in feet No date: PONV (postoperative nausea and vomiting)     Comment:  severe nausea requiring many doses of post op               antiemetics 10/2017: Stroke [facility])     Comment:  thinks she had a series of mini strokes.right leg up to               right side of face were  numb. no loss of consciousness  Past Surgical History: 2007: ABDOMINAL HYSTERECTOMY 12/08/2017: AV FISTULA INSERTION W/ RF MAGNETIC GUIDANCE; N/A     Comment:  Procedure: AV FISTULA INSERTION W/RF MAGNETIC GUIDANCE;               Surgeon: Jama Cordella MATSU, MD;  Location: [facility] INVASIVE              CV LAB;  Service: Cardiovascular;  Laterality: N/A; 2004: ROTATOR CUFF REPAIR; Right 2004: SHOULDER CLOSED REDUCTION; Right 02/15/2018: UPPER EXTREMITY VENOGRAPHY; Left     Comment:  Procedure: UPPER EXTREMITY VENOGRAPHY;  Surgeon:               Jama Cordella MATSU, MD;  Location: [facility] INVASIVE CV LAB;               Service: Cardiovascular;  Laterality: Left;  BMI    Body Mass Index: 30.04 kg/m      Reproductive/Obstetrics negative OB ROS                              Anesthesia Physical Anesthesia Plan  ASA: 4  Anesthesia Plan: General   Post-op Pain Management: Minimal or no pain anticipated   Induction: Intravenous  PONV Risk Score and Plan: 3 and Ondansetron , Dexamethasone , Treatment may vary due to age or medical condition and Midazolam   Airway Management  Planned: Oral ETT  Additional Equipment: None  Intra-op Plan:   Post-operative Plan: Extubation in OR  Informed Consent: I have reviewed the patients History and Physical, chart, labs and discussed the procedure including the risks, benefits and alternatives for the proposed anesthesia with the patient or authorized representative who has indicated his/her understanding and acceptance.     Dental advisory given  Plan Discussed with: CRNA and Surgeon  Anesthesia Plan Comments: (Discussed risks of anesthesia with patient, including PONV, sore throat, lip/dental/eye damage. Rare risks discussed as well, such as cardiorespiratory and neurological sequelae, and allergic reactions. Discussed the role of CRNA in patient's perioperative care. Patient understands.)         Anesthesia Quick  Evaluation

## 2024-03-09 NOTE — Transfer of Care (Signed)
 Immediate Anesthesia Transfer of Care Note  Patient: [name]  Procedure(s) Performed: PACEMAKER LEADLESS INSERTION  Patient Location: PACU and Cath Lab  Anesthesia Type:General  Level of Consciousness: sedated  Airway & Oxygen Therapy: Patient Spontanous Breathing and Patient connected to nasal cannula oxygen  Post-op Assessment: Report given to RN and Post -op Vital signs reviewed and stable  Post vital signs: Reviewed and stable  Last Vitals:  Vitals Value Taken Time  BP    Temp    Pulse    Resp    SpO2      Last Pain:  Vitals:   03/09/24 1038  TempSrc: Oral  PainSc:          Complications: No notable events documented.

## 2024-03-09 NOTE — Progress Notes (Signed)
 PROGRESS NOTE [name]  FMW:[id number] DOB: [birth year] DOA: 03/02/2024 PCP: Pcp, No  Brief Narrative/Hospital Course: 64 yof w/ DM,HTN,nephrolithiasis, neuromuscular disorder, stroke, and anemia and ESRD on HD mwf admitted with symptomatic bradycardia secondary to complete heart block.  EP has been consulted> and pacemaker placement is being planned.  Subjective: Patient seen and examined Family at the bedside Reports  anxiety waiting for procedure Overnight BP stable heart rate in 40s, blood sugar stable this morning  Underwent dialysis without issue 7/9  Assessment and plan:  CHB Symptomatic bradycardia Mildly elevated but flat troponin-likely demand ischemia from CHB: EP cardiology following monitoring in telemetry and planning for pacemaker placement today S/p HD yesterday Abnormal EKG question ST elevation currently troponin flat no chest pain.  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  Monitor  History of CVA: Not on antiplatelets currently  Diabetes mellitus w/ uncontrolled hyperglycemia: A1c 9.8 blood sugar stable on 10 units premeal and SSI Recent Labs  Lab 03/03/24 0204 03/03/24 0601 03/08/24 0613 03/08/24 1240 03/08/24 1620 03/08/24 2133 03/09/24 0706  GLUCAP  --    < > 206* 182* 244* 104* 183*  HGBA1C 9.8*  --   --   --   --   --   --    < > = values in this interval not displayed.    Asthma: Stable   DVT prophylaxis: SCDs Start: 03/02/24 2353 Code Status:   Code Status: Full Code Family Communication: plan of care discussed with patient at bedside. Patient status is: Remains hospitalized because of severity of illness Level of care: Progressive   Dispo:  The patient is from: home w/ husband            Anticipated disposition: TBD Objective: Vitals last 24 hrs: Vitals:   03/08/24 2031 03/09/24 0530 03/09/24 0532 03/09/24 0809  BP: (!) 141/64  133/60 (!) 147/71  Pulse: (!) 47  (!) 42   Resp: 18  18 20   Temp: 98.1 F (36.7 C)  97.7 F (36.5 C) 97.8 F (36.6 C)  TempSrc: Oral  Oral   SpO2: 96%  90% 94%  Weight:  88.5 kg    Height:        Physical Examination: General exam: alert awake, oriented  HEENT:Oral mucosa moist, Ear/Nose WNL grossly Respiratory system: Bilaterally clear BS,no use of accessory muscle Cardiovascular system: S1 & S2 +, No JVD. Gastrointestinal system: Abdomen soft,NT,ND, BS+ Nervous System: Alert, awake, moving all extremities,and following commands. Extremities: LE edema neg,distal peripheral pulses palpable and warm.  Skin: No rashes,no icterus. MSK: Normal muscle bulk,tone, power   Data Reviewed: I have personally reviewed following labs and imaging studies ( see epic result tab) CBC: Recent Labs  Lab 03/02/24 1310 03/03/24 0204 03/05/24 1438 03/06/24 1442 03/08/24 0703  WBC 7.0 8.2 9.8 9.2 8.7  NEUTROABS  --   --  8.1*  --   --   HGB 10.8* 10.7* 10.9* 10.1* 10.2*  HCT 34.1* 33.5* 34.4* 32.3* 33.3*  MCV 104.0* 104.0* 103.6* 105.2* 105.7*  PLT 218 215 212 181 155   CMP: Recent Labs  Lab 03/02/24 1310 03/03/24 0204 03/05/24 1438 03/05/24 1449 03/06/24 0448 03/08/24 0703  NA 137 138  --  133* 132* 136  K 3.2* 3.2*  --  4.4 4.5 4.8  CL 93* 96*  --  94* 94* 97*  CO2 24 25  --  22 18* 24  GLUCOSE 339* 234*  --  247* 208* 216*  BUN 32* 36*  --  39* 44* 41*  CREATININE 6.78* 7.90*  --  7.83* 8.36* 7.66*  CALCIUM  7.9* 8.0*  --  9.0 8.8* 9.1  MG  --  2.0 2.4  --  2.3  --   PHOS  --  6.2*  --  6.6* 7.8* 6.3*   GFR: Estimated Creatinine Clearance: 9 mL/min (A) (by C-G formula based on SCr of 7.66 mg/dL (H)). Recent Labs  Lab 03/05/24 1449 03/06/24 0448 03/08/24 0703  ALBUMIN 3.4* 3.2*  3.1*   No results for input(s): LIPASE, AMYLASE in the last 168 hours. No results for input(s): AMMONIA in the last 168 hours. Coagulation Profile: No results for input(s): INR, PROTIME in the last 168 hours. Unresulted Labs (From admission, onward)     Start     Ordered   Signed and Held  Renal function panel  Once,   R        Signed and Held           Antimicrobials/Microbiology: Anti-infectives (From admission, onward)    Start     Dose/Rate Route Frequency Ordered Stop   03/09/24 0800  ceFAZolin  (ANCEF ) IVPB 2g/100 mL premix        2 g 200 mL/hr over 30 Minutes Intravenous To Cath Lab 03/08/24 2034 03/10/24 0800   03/04/24 0830  ceFEPIme  (MAXIPIME ) 1 g in sodium chloride  0.9 % 100 mL IVPB       Note to Pharmacy: One time dose to complete patients outpatient course she started 7 days ago   1 g 200 mL/hr over 30 Minutes Intravenous  Once 03/04/24 0730 03/04/24 0916         Component Value Date/Time   SDES  11/15/2021 1013    ABSCESS Performed at [facility], [address] [place], [place] [address]    [facility]  11/15/2021 1013    soft tissue left hip wound Performed at [facility], [address] [place], [place] [address]    CULT  11/15/2021 1013    NO GROWTH 2 DAYS Performed at [facility], [address], [place], [place] [address]    REPTSTATUS 11/18/2021 FINAL 11/15/2021 1013    Procedures: Procedure(s) (LRB): PACEMAKER LEADLESS INSERTION (N/A) Medications reviewed:  Scheduled Meds:  Chlorhexidine  Gluconate Cloth  6 each Topical Daily   Chlorhexidine  Gluconate Cloth  6 each Topical Q0600   Chlorhexidine  Gluconate Cloth  6 each Topical Q0600   insulin  aspart  0-5 Units Subcutaneous QHS   insulin  aspart  0-6 Units Subcutaneous TID WC   insulin  aspart  10 Units Subcutaneous TID WC   sevelamer  carbonate  1,600 mg Oral TID WC   sodium chloride  flush  3 mL Intravenous Q12H   sodium chloride  flush  3 mL Intravenous  Q12H   Continuous Infusions:  sodium chloride      sodium chloride  20 mL/hr at 03/09/24 9380    ceFAZolin  (ANCEF ) IV      Mennie LAMY, MD [facility] 03/09/2024, 11:07 AM

## 2024-03-09 NOTE — Anesthesia Postprocedure Evaluation (Signed)
 Anesthesia Post Note  Patient: [name]  Procedure(s) Performed: PACEMAKER LEADLESS INSERTION     Patient location during evaluation: PACU Anesthesia Type: General Level of consciousness: awake and alert Pain management: pain level controlled Vital Signs Assessment: post-procedure vital signs reviewed and stable Respiratory status: spontaneous breathing, nonlabored ventilation and respiratory function stable Cardiovascular status: stable and blood pressure returned to baseline Anesthetic complications: no   No notable events documented.  Last Vitals:  Vitals:   03/09/24 1915 03/09/24 1925  BP: 137/82 (!) 147/69  Pulse: 62 (!) 59  Resp: (!) 23 20  Temp:    SpO2: 94% 95%    Last Pain:  Vitals:   03/09/24 1902  TempSrc: Axillary  PainSc: 0-No pain                 Debby FORBES Like

## 2024-03-10 ENCOUNTER — Other Ambulatory Visit: Payer: Self-pay

## 2024-03-10 ENCOUNTER — Telehealth: Payer: Self-pay

## 2024-03-10 ENCOUNTER — Inpatient Hospital Stay (HOSPITAL_COMMUNITY)

## 2024-03-10 DIAGNOSIS — N186 End stage renal disease: Secondary | ICD-10-CM

## 2024-03-10 DIAGNOSIS — R001 Bradycardia, unspecified: Secondary | ICD-10-CM | POA: Diagnosis not present

## 2024-03-10 DIAGNOSIS — I442 Atrioventricular block, complete: Secondary | ICD-10-CM | POA: Diagnosis not present

## 2024-03-10 LAB — GLUCOSE, CAPILLARY
Glucose-Capillary: 393 mg/dL — ABNORMAL HIGH (ref 70–99)
Glucose-Capillary: 345 mg/dL — ABNORMAL HIGH (ref 70–99)
Glucose-Capillary: 283 mg/dL — ABNORMAL HIGH (ref 70–99)

## 2024-03-10 LAB — BASIC METABOLIC PANEL WITH GFR
Anion gap: 15 (ref 5–15)
BUN: 40 mg/dL — ABNORMAL HIGH (ref 6–20)
CO2: 22 mmol/L (ref 22–32)
Calcium: 8.9 mg/dL (ref 8.9–10.3)
Chloride: 97 mmol/L — ABNORMAL LOW (ref 98–111)
Creatinine, Ser: 6.92 mg/dL — ABNORMAL HIGH (ref 0.44–1.00)
GFR, Estimated: 6 mL/min — ABNORMAL LOW (ref >60)
Glucose, Bld: 362 mg/dL — ABNORMAL HIGH (ref 70–99)
Potassium: 6.3 mmol/L (ref 3.5–5.1)
Sodium: 134 mmol/L — ABNORMAL LOW (ref 135–145)

## 2024-03-10 LAB — CBC
HCT: 32.7 % — ABNORMAL LOW (ref 36.0–46.0)
Hemoglobin: 10 g/dL — ABNORMAL LOW (ref 12.0–15.0)
MCH: 32.5 pg (ref 26.0–34.0)
MCHC: 30.6 g/dL (ref 30.0–36.0)
MCV: 106.2 fL — ABNORMAL HIGH (ref 80.0–100.0)
Platelets: 143 K/uL — ABNORMAL LOW (ref 150–400)
RBC: 3.08 MIL/uL — ABNORMAL LOW (ref 3.87–5.11)
RDW: 15.4 % (ref 11.5–15.5)
WBC: 6.7 K/uL (ref 4.0–10.5)
nRBC: 0 % (ref 0.0–0.2)

## 2024-03-10 MED ORDER — LIDOCAINE-PRILOCAINE 2.5-2.5 % EX CREA: 1.0000 | TOPICAL_CREAM | CUTANEOUS | Status: DC | PRN

## 2024-03-10 MED ORDER — ALBUTEROL SULFATE (2.5 MG/3ML) 0.083% IN NEBU
10.0000 mg | INHALATION_SOLUTION | Freq: Once | RESPIRATORY_TRACT | Status: DC
  Filled 2024-03-10: qty 12

## 2024-03-10 MED ORDER — HEPARIN SODIUM (PORCINE) 1000 UNIT/ML DIALYSIS: 1000.0000 [IU] | INTRAMUSCULAR | Status: DC | PRN

## 2024-03-10 MED ORDER — ANTICOAGULANT SODIUM CITRATE 4% (200MG/5ML) IV SOLN: 5.0000 mL | Status: DC | PRN

## 2024-03-10 MED ORDER — SODIUM ZIRCONIUM CYCLOSILICATE 10 G PO PACK
10.0000 g | PACK | Freq: Once | ORAL | Status: AC
  Administered 2024-03-10: 10 g via ORAL
  Filled 2024-03-10 (×2): qty 1

## 2024-03-10 MED ORDER — ALTEPLASE 2 MG IJ SOLR: 2.0000 mg | Freq: Once | INTRAMUSCULAR | Status: DC | PRN

## 2024-03-10 MED ORDER — SODIUM BICARBONATE 8.4 % IV SOLN: 50.0000 meq | Freq: Once | INTRAVENOUS | Status: DC

## 2024-03-10 MED ORDER — PENTAFLUOROPROP-TETRAFLUOROETH EX AERO: 1.0000 | INHALATION_SPRAY | CUTANEOUS | Status: DC | PRN

## 2024-03-10 MED ORDER — HEPARIN SODIUM (PORCINE) 1000 UNIT/ML DIALYSIS: 2000.0000 [IU] | INTRAMUSCULAR | Status: DC | PRN

## 2024-03-10 MED ORDER — INSULIN ASPART 100 UNIT/ML IV SOLN
5.0000 [IU] | Freq: Once | INTRAVENOUS | Status: AC
  Administered 2024-03-10: 5 [IU] via INTRAVENOUS

## 2024-03-10 MED ORDER — LIDOCAINE HCL (PF) 1 % IJ SOLN: 5.0000 mL | INTRAMUSCULAR | Status: DC | PRN

## 2024-03-10 NOTE — Progress Notes (Addendum)
 D/C order noted. Contacted [facility]  to advise staff of pt's d/c today and to confirm pt still has an appt for tomorrow at 11:30. Staff confirm pt still has appt for tomorrow. H and P and most recent renal note faxed to clinic for continuation of care. Will fax d/c summary once available.  Randine Mungo Renal Navigator [phone]  Addendum at 2:08 pm: D/C summary faxed to HD clinic for continuation of care.

## 2024-03-10 NOTE — Telephone Encounter (Signed)
 Follow-up after same day discharge: Implant date: 03/09/2024 MD: Eulas Furbish Device: Medtronic leadless Pacemaker Location: Left Chest   Wound check visit: 03/23/2024 90 day MD follow-up: 06/12/2024  Remote Transmission received:n/a  Dressing/sling removed: n/a  Confirm OAC restart on: n/a  Please continue to monitor your cardiac device site for redness, swelling, and drainage. Call the device clinic at [phone] if you experience these symptoms, fever/chills, or have questions about your device.   Remote monitoring is used to monitor your cardiac device from home. This monitoring is scheduled every 91 days by our office. It allows us  to keep an eye on the functioning of your device to ensure it is working properly.

## 2024-03-10 NOTE — Discharge Summary (Signed)
 Physician Discharge Summary  [name] FMW:[id number] DOB: May 17, 1961 DOA: 03/02/2024  PCP: Pcp, No  Admit date: 03/02/2024 Discharge date: 03/10/2024 Recommendations for Outpatient Follow-up:  Follow up with PCP in 1 weeks-call for appointment Please obtain BMP 2 DAYS  Discharge Dispo: HOME Discharge Condition: Stable Code Status:   Code Status: Full Code Diet recommendation:  Diet Order             Diet Carb Modified Fluid consistency: Thin; Room service appropriate? Yes  Diet effective now                    Brief/Interim Summary: 58 yof w/ DM,HTN,nephrolithiasis, neuromuscular disorder, stroke, and anemia and ESRD on HD mwf admitted with symptomatic bradycardia secondary to complete heart block.  EP has been consulted> and pacemaker was placed subsequently on 7/10. Patient potassium on higher side but she is refusing dialysis or further treatment here did agree for Lokelma  finally after nephrology discussed with her.  Case discussed with nephrology this morning and at this time okay for discharge home and to have dialysis in the morning   Subjective: seen and examined Wants to go home now. Drank lokelma , does not want HD today and wants to do in am Overnight afebrile BP stable  Labs showed hyperkalemia, temporizing measures were ordered, patient refusing Lokelma  but received insulin  albuterol   Discharge Diagnoses:  CHB s/p leadless pacemaker placement 7/10 Symptomatic bradycardia Mildly elevated but flat troponin-likely demand ischemia from CHB: EP cardiology following s/p  PPM 7/10 Continue to monitor wound, monitor in telemetry.  ESRD on HD MWF Hyperkalemia: Nephrology following to manage fluid status-had HD 7/9-overnight potassium high 6.3 received albuterol  neb and insulin  refused Lokelma , repeat labs refused by patient. Finally did take lokelam, requesting for discharge home now. Advised low potassium diet. I discussed with nephrology who recommends patient can  be discharged after Lokelma  and will need to repeat BMP today.  Although I did discuss with the patient but at this time she is not interested for inpatient dialysis and rechecking her potassium despite being aware of risk of hyperkalemia.  She reported that she has had higher potassium level han that in the past and tolerated well and will go for dialysis tomorrow morning.  I did inform her the risk of cardiac arrest with hyperkalemia.  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  Monitor volume status and adjust with dialysis.  Recent pneumonia: Antibiotics completed  Anemia of kidney disease: Hemoglobin stable in 10 g.  Monitor  History of CVA: Not on antiplatelets currently  Diabetes mellitus w/ uncontrolled hyperglycemia: A1c 9.8 blood sugar poorly controlled, on 10 units Premeal> increase to home dose of 14 units cont SSI.  Changed to carb controlled diet Recent Labs  Lab 03/09/24 1910 03/09/24 2226 03/10/24 0554 03/10/24 0805 03/10/24 1045  GLUCAP 212* 359* 393* 345* 283*    Asthma: Stable  Consults: Ep CARDIO Nephrology  Procedure(s) (LRB): PACEMAKER LEADLESS INSERTION (N/A)   Discharge Exam: Vitals:   03/10/24 0240 03/10/24 0710  BP: 137/69 136/81  Pulse: (!) 53 (!) 58  Resp: 18 18  Temp:  97.6 F (36.4 C)  SpO2: 97% 94%   General: Pt is alert, awake, not in acute distress Cardiovascular: RRR, S1/S2 +, no rubs, no gallops Respiratory: CTA bilaterally, no wheezing, no rhonchi Abdominal: Soft, NT, ND, bowel sounds + Extremities: no edema, no cyanosis  Discharge Instructions  Discharge Instructions     Discharge  instructions   Complete by: As directed    Please call call MD or return to ER for similar or worsening recurring problem that brought you to hospital or if any fever,nausea/vomiting,abdominal pain, uncontrolled pain, chest pain,  shortness of breath or any other alarming  symptoms.  Please follow-up your doctor as instructed in a week time and call the office for appointment.  Please avoid alcohol, smoking, or any other illicit substance and maintain healthy habits including taking your regular medications as prescribed.  You were cared for by a hospitalist during your hospital stay. If you have any questions about your discharge medications or the care you received while you were in the hospital after you are discharged, you can call the unit and ask to speak with the hospitalist on call if the hospitalist that took care of you is not available.  Once you are discharged, your primary care physician will handle any further medical issues. Please note that NO REFILLS for any discharge medications will be authorized once you are discharged, as it is imperative that you return to your primary care physician (or establish a relationship with a primary care physician if you do not have one) for your aftercare needs so that they can reassess your need for medications and monitor your lab values   Increase activity slowly   Complete by: As directed    No wound care   Complete by: As directed       Allergies as of 03/10/2024       Reactions   Enalapril Hives, Other (See Comments)   Angioedema face.   Ivp Dye [iodinated Contrast Media] Hives   Lisinopril Shortness Of Breath        Medication List     STOP taking these medications    azithromycin  250 MG tablet Commonly known as: Zithromax    cephALEXin  250 MG capsule Commonly known as: KEFLEX        TAKE these medications    albuterol  108 (90 Base) MCG/ACT inhaler Commonly known as: VENTOLIN  HFA Inhale 2 puffs into the lungs every 6 (six) hours as needed for wheezing or shortness of breath.   NovoLOG  FlexPen 100 UNIT/ML FlexPen Generic drug: insulin  aspart Inject 14 Units into the skin 3 (three) times daily with meals.   sevelamer  carbonate 800 MG tablet Commonly known as: RENVELA  Take 2,400 mg  by mouth 3 (three) times daily with meals.   Vitamin D  (Ergocalciferol ) 1.25 MG (50000 UNIT) Caps capsule Commonly known as: DRISDOL  Take 1 capsule (50,000 Units total) by mouth every 7 (seven) days. What changed: additional instructions        Follow-up Information     [facility] Follow up in 1 day(s).                 Allergies  Allergen Reactions   Enalapril Hives and Other (See Comments)    Angioedema face.   Ivp Dye [Iodinated Contrast Media] Hives   Lisinopril Shortness Of Breath    The results of significant diagnostics from this hospitalization (including imaging, microbiology, ancillary and laboratory) are listed below for reference.    Microbiology: No results found for this or any previous visit (from the past 240 hours).  Procedures/Studies: DG Chest 2 View Result Date: 03/10/2024 CLINICAL DATA:  [id number].  Leadless pacemaker in place. EXAM: CHEST - 2 VIEW COMPARISON:  Portable chest 03/02/2024 FINDINGS: A leadless pacemaker has been inserted and is located in the anterior aspect of the right ventricle. Right  IJ dialysis catheter again terminates in the upper right atrium. No pneumothorax. The lungs are generally clear with mild hypoinflation. The heart is enlarged with normal caliber central vessel markings. The mediastinum is normally outlined. There is a aortic atherosclerosis. No new osseous finding. IMPRESSION: 1. Leadless pacemaker inserted without evidence of pneumothorax. 2. Cardiomegaly. 3. Aortic atherosclerosis. Electronically Signed   By: Francis Quam M.D.   On: 03/10/2024 07:55   EP PPM/ICD IMPLANT Result Date: 03/09/2024 Table formatting from the original result was not included.  SURGEON:  Eulas Furbish, MD    PREPROCEDURE DIAGNOSES:  1. Irreversible symptomatic bradycardia due to 2:1 heart block    POSTPROCEDURE DIAGNOSES:  1. Irreversible symptomatic bradycardia due to 2:1 heart block    PROCEDURES:   1. Leadless pacemaker placement     INTRODUCTION: The patient is a 57yo woman with ESRD admitted with complete heart block who presents to the EP lab for placement of leadless permanent pacemaker.    DESCRIPTION OF PROCEDURE:  Informed written consent was obtained and the patient was brought to the electrophysiology lab in the fasting state. The patient was placed under general anesthesia by the anesthesiology service.  The patient's bilateral groins were prepped and draped in the usual sterile fashion by the EP lab staff.   Using ultrasound guidance, the right femoral vein was accessed by modified Seldinger technique. An amplatzer superstiff wire was advanced to the SVC. The RFV was dilated serially.  The Medtronic Micra access sheath advanced to the IVC RA junction.  Device placement. The device was advanced to the IVC RA junction. The device was then advanced to the RV and a good distal, septal position obtained. Positioning was confirmed with RAO and LAO views, and a contrast injection performed in RAO to confirm space at the anterior interventricular groove. The device was deployed. Sensing, capture, and impedence were assessed. Good fixation was confirmed with deflection of 3 tines. The device was flushed, and the tether clipped and removed.     RV Model Micra AV Serial # [id number] E Amplitude 6.5 mV Threshold 0.8 V @ 0.24 mS Impedence 500 ohms   Hemostasis was obtained with 2 Perclose devices a figure-of-eight suture secured with a stopcock.    CONCLUSIONS:  1. Successful placement of a ventricular leadless pacemakers -- Medtronic Micra AV  3.  No early apparent complications.   Eulas Furbish, MD Cardiac Electrophysiology     ECHOCARDIOGRAM COMPLETE Result Date: 03/03/2024    ECHOCARDIOGRAM REPORT   Patient Name:   [name] Date of Exam: 03/03/2024 Medical Rec #:  [id number]    Height:       67.0 in Accession #:    [id number]   Weight:       188.1 lb Date of Birth:  [birth year]    BSA:          1.970 m Patient Age:    57 years     BP:            139/69 mmHg Patient Gender: F            HR:           49 bpm. Exam Location:  Inpatient Procedure: 2D Echo, Strain Analysis, Cardiac Doppler and Color Doppler (Both            Spectral and Color Flow Doppler were utilized during procedure). Indications:    Heart Block, Complete I44.2  History:        Patient has no prior history of  Echocardiogram examinations.                 Arrythmias:Bradycardia, Signs/Symptoms:Shortness of Breath; Risk                 Factors:Hypertension, Diabetes and Dyslipidemia.  Sonographer:    Koleen Popper RDCS Referring Phys: [id number] JONATHAN SEGARS  Sonographer Comments: Global longitudinal strain was attempted. IMPRESSIONS  1. Left ventricular ejection fraction, by estimation, is 40 to 45%. The left ventricle has mildly decreased function. The left ventricle demonstrates regional wall motion abnormalities (see scoring diagram/findings for description). The left ventricular  internal cavity size was moderately dilated. Left ventricular diastolic function could not be evaluated. Elevated left atrial pressure. The E/e' is 17.  2. Right ventricular systolic function is low normal. The right ventricular size is normal. increased. A catheter within the right atrium is visualized. There is severely elevated pulmonary artery systolic pressure.  3. The mitral valve is degenerative. Severe mitral valve regurgitation. Mild mitral valve stenosis (MG , MVA (VTI) 1.07cm2, HR 46bpm, RVSP ). Accuracy limited due to complete heart block. mitral stenosis. Severe mitral annular calcification.  4. Tricuspid valve regurgitation is severe.  5. The aortic valve is normal in structure. Aortic valve regurgitation is not visualized. Aortic valve sclerosis with mild stenosis (peak velocity 2.70m/s, MG , AVA VTI 1.33cm2, DI 0.47). LCC is not very mobile.  6. Pulmonic valve regurgitation is moderate.  7. The inferior vena cava is dilated in size with <50% respiratory variability, suggesting  right atrial pressure of 15 mmHg. Comparison(s): No prior Echocardiogram. FINDINGS  Left Ventricle: Left ventricular ejection fraction, by estimation, is 40 to 45%. The left ventricle has mildly decreased function. The left ventricle demonstrates regional wall motion abnormalities. Global longitudinal strain performed but not reported based on interpreter judgement due to suboptimal tracking. The left ventricular internal cavity size was moderately dilated. There is no left ventricular hypertrophy. Left ventricular diastolic function could not be evaluated due to mitral annular calcification (moderate or greater). Left ventricular diastolic function could not be evaluated. Elevated left atrial pressure. The E/e' is 67.  LV Wall Scoring: The entire septum and mid inferior segment are hypokinetic. Right Ventricle: The right ventricular size is normal. Increased. Right ventricular systolic function is low normal. There is severely elevated pulmonary artery systolic pressure. The tricuspid regurgitant velocity is 3.63 m/s, and with an assumed right atrial pressure of 15 mmHg, the estimated right ventricular systolic pressure is 67.7 mmHg. Left Atrium: Left atrial size was normal in size. Right Atrium: Right atrial size was normal in size. Pericardium: There is no evidence of pericardial effusion. Mitral Valve: The mitral valve is degenerative in appearance. There is mild thickening of the mitral valve leaflet(s). There is mild calcification of the mitral valve leaflet(s). Mildly decreased mobility of the mitral valve leaflets. Severe mitral annular calcification. Severe mitral valve regurgitation. Mild mitral valve stenosis (MG , MVA (VTI) 1.07cm2, HR 46bpm, RVSP ). Accuracy limited due to complete heart block. mitral valve stenosis. MV peak gradient, 11.7 mmHg. The mean mitral valve gradient is 3.0 mmHg. Tricuspid Valve: The tricuspid valve is grossly normal. Tricuspid valve regurgitation is severe. No  evidence of tricuspid stenosis. The flow in the hepatic veins is reversed during ventricular systole. Aortic Valve: The aortic valve is normal in structure. Aortic valve regurgitation is not visualized. Aortic valve sclerosis with mild stenosis (peak velocity 2.58m/s, MG , AVA VTI 1.33cm2, DI 0.47). LCC is not very mobile. Aortic valve mean gradient  measures 10.5  mmHg. Aortic valve peak gradient measures 27.8 mmHg. Aortic valve area, by VTI measures 1.33 cm. Pulmonic Valve: The pulmonic valve was grossly normal. Pulmonic valve regurgitation is moderate. No evidence of pulmonic stenosis. Aorta: The aortic root and ascending aorta are structurally normal, with no evidence of dilitation. Venous: The inferior vena cava is dilated in size with less than 50% respiratory variability, suggesting right atrial pressure of 15 mmHg. IAS/Shunts: The atrial septum is grossly normal. Additional Comments: A catheter within the right atrium is visualized.  LEFT VENTRICLE PLAX 2D LVIDd:         5.80 cm   Diastology LVIDs:         4.40 cm   LV e' medial:    7.83 cm/s LV PW:         1.00 cm   LV E/e' medial:  18.4 LV IVS:        0.90 cm   LV e' lateral:   8.81 cm/s LVOT diam:     1.90 cm   LV E/e' lateral: 16.3 LV SV:         57 LV SV Index:   29 LVOT Area:     2.84 cm  RIGHT VENTRICLE            IVC RV Basal diam:  4.30 cm    IVC diam: 2.20 cm RV Mid diam:    3.90 cm RV S prime:     8.70 cm/s TAPSE (M-mode): 1.8 cm LEFT ATRIUM             Index        RIGHT ATRIUM           Index LA diam:        5.20 cm 2.64 cm/m   RA Area:     17.70 cm LA Vol (A2C):   61.0 ml 30.96 ml/m  RA Volume:   50.20 ml  25.48 ml/m LA Vol (A4C):   61.5 ml 31.22 ml/m LA Biplane Vol: 61.3 ml 31.12 ml/m  AORTIC VALVE AV Area (Vmax):    1.26 cm AV Area (Vmean):   1.28 cm AV Area (VTI):     1.33 cm AV Vmax:           263.75 cm/s AV Vmean:          148.000 cm/s AV VTI:            0.428 m AV Peak Grad:      27.8 mmHg AV Mean Grad:      10.5 mmHg LVOT  Vmax:         117.00 cm/s LVOT Vmean:        66.900 cm/s LVOT VTI:          0.201 m LVOT/AV VTI ratio: 0.47  AORTA Ao Root diam: 2.90 cm Ao Asc diam:  3.20 cm MITRAL VALVE                TRICUSPID VALVE MV Area (PHT): 2.95 cm     TR Peak grad:   52.7 mmHg MV Area VTI:   1.07 cm     TR Vmax:        363.00 cm/s MV Peak grad:  11.7 mmHg MV Mean grad:  3.0 mmHg     SHUNTS MV Vmax:       1.71 m/s     Systemic VTI:  0.20 m MV Vmean:      70.1 cm/s    Systemic Diam: 1.90 cm  MV Decel Time: 257 msec MV E velocity: 144.00 cm/s MV A velocity: 82.60 cm/s MV E/A ratio:  1.74 Sunit Tolia Electronically signed by Madonna Large Signature Date/Time: 03/03/2024/1:50:34 PM    Final    DG Chest Port 1 View Result Date: 03/02/2024 CLINICAL DATA:  Chest pain. EXAM: PORTABLE CHEST 1 VIEW COMPARISON:  02/20/2024. FINDINGS: Low lung volumes, similar to the prior exam. Defibrillator pad overlies the left chest wall. Stable cardiomegaly. Right sided CVC catheter tip overlies the superior cavoatrial junction, unchanged. No focal consolidation, pleural effusion, or pneumothorax. No acute osseous abnormality. IMPRESSION: 1. Low lung volumes.  No acute cardiopulmonary findings. 2. Stable cardiomegaly. Electronically Signed   By: Harrietta Sherry M.D.   On: 03/02/2024 14:03   DG Chest Portable 1 View Result Date: 02/20/2024 CLINICAL DATA:  Shortness of breath EXAM: PORTABLE CHEST 1 VIEW COMPARISON:  Chest x-ray 12/14/2021 FINDINGS: Right-sided central venous catheter tip projects over the caval atrial junction. The heart is enlarged. There is no pleural effusion or pneumothorax. There are minimal patchy airspace opacities in the lateral left lower lung. No acute osseous abnormality. IMPRESSION: 1. Minimal patchy airspace opacities in the lateral left lower lung, which may represent atelectasis or infection. 2. Cardiomegaly. Electronically Signed   By: Greig Pique M.D.   On: 02/20/2024 20:49    Labs: BNP (last 3 results) No results for  input(s): BNP in the last 8760 hours. Basic Metabolic Panel: Recent Labs  Lab 03/05/24 1438 03/05/24 1449 03/06/24 0448 03/08/24 0703 03/09/24 1602 03/10/24 0238  NA  --  133* 132* 136 140 134*  K  --  4.4 4.5 4.8 4.3 6.3*  CL  --  94* 94* 97* 106 97*  CO2  --  22 18* 24  --  22  GLUCOSE  --  247* 208* 216* 146* 362*  BUN  --  39* 44* 41* 25* 40*  CREATININE  --  7.83* 8.36* 7.66* 4.80* 6.92*  CALCIUM   --  9.0 8.8* 9.1  --  8.9  MG 2.4  --  2.3  --   --   --   PHOS  --  6.6* 7.8* 6.3*  --   --    Liver Function Tests: Recent Labs  Lab 03/05/24 1449 03/06/24 0448 03/08/24 0703  ALBUMIN 3.4* 3.2* 3.1*   No results for input(s): LIPASE, AMYLASE in the last 168 hours. No results for input(s): AMMONIA in the last 168 hours. CBC: Recent Labs  Lab 03/05/24 1438 03/06/24 1442 03/08/24 0703 03/09/24 1602 03/10/24 0238  WBC 9.8 9.2 8.7  --  6.7  NEUTROABS 8.1*  --   --   --   --   HGB 10.9* 10.1* 10.2* 9.5* 10.0*  HCT 34.4* 32.3* 33.3* 28.0* 32.7*  MCV 103.6* 105.2* 105.7*  --  106.2*  PLT 212 181 155  --  143*   CBG: Recent Labs  Lab 03/09/24 1910 03/09/24 2226 03/10/24 0554 03/10/24 0805 03/10/24 1045  GLUCAP 212* 359* 393* 345* 283*   Recent Labs  Lab 03/05/24 1438 03/06/24 1442 03/08/24 0703 03/10/24 0238  WBC 9.8 9.2 8.7 6.7   Microbiology No results found for this or any previous visit (from the past 240 hours).   Time coordinating discharge: 25 minutes  SIGNED: Mennie LAMY, MD  [facility] 03/10/2024, 1:19 PM  If 7PM-7AM, please contact night-coverage [email or web address]

## 2024-03-10 NOTE — Progress Notes (Signed)
 [facility] Progress Note   Subjective:   K 6.3 this am. Offered dialysis today and she refused. She wants to go home and she has arranged outpatient dialysis at her clinic tomorrow. She does agree to take Lokelma  this morning    Objective Vitals:   03/09/24 1925 03/09/24 2318 03/10/24 0240 03/10/24 0710  BP: (!) 147/69 132/66 137/69 136/81  Pulse: (!) 59 (!) 55 (!) 53 (!) 58  Resp: 20 17 18 18   Temp:  97.8 F (36.6 C)  97.6 F (36.4 C)  TempSrc:  Oral  Oral  SpO2: 95% 96% 97% 94%  Weight:      Height:         Additional Objective Labs: Basic Metabolic Panel: Recent Labs  Lab 03/05/24 1449 03/06/24 0448 03/08/24 0703 03/09/24 1602 03/10/24 0238  NA 133* 132* 136 140 134*  K 4.4 4.5 4.8 4.3 6.3*  CL 94* 94* 97* 106 97*  CO2 22 18* 24  --  22  GLUCOSE 247* 208* 216* 146* 362*  BUN 39* 44* 41* 25* 40*  CREATININE 7.83* 8.36* 7.66* 4.80* 6.92*  CALCIUM  9.0 8.8* 9.1  --  8.9  PHOS 6.6* 7.8* 6.3*  --   --    CBC: Recent Labs  Lab 03/05/24 1438 03/06/24 1442 03/08/24 0703 03/09/24 1602 03/10/24 0238  WBC 9.8 9.2 8.7  --  6.7  NEUTROABS 8.1*  --   --   --   --   HGB 10.9* 10.1* 10.2* 9.5* 10.0*  HCT 34.4* 32.3* 33.3* 28.0* 32.7*  MCV 103.6* 105.2* 105.7*  --  106.2*  PLT 212 181 155  --  143*   Blood Culture    Component Value Date/Time   SDES  11/15/2021 1013    ABSCESS Performed at [facility], [address], [place], [place] [address]    [facility]  11/15/2021 1013    soft tissue left hip wound Performed at [facility], [address] [place], [place] [address]    CULT  11/15/2021 1013    NO GROWTH 2 DAYS Performed at [facility], [address] [place], [place] [address]    REPTSTATUS 11/18/2021 FINAL 11/15/2021 1013     Physical Exam General: Well appearing, nad  Heart: [place]  Lungs: Clear, through out  Abdomen: non tender  Extremities: No LE edema  Dialysis Access: [facility]  Medications:   sodium chloride       albuterol   10 mg Nebulization Once   Chlorhexidine  Gluconate Cloth  6 each Topical Daily   Chlorhexidine  Gluconate Cloth  6 each Topical Q0600   insulin  aspart  0-5 Units Subcutaneous QHS   insulin  aspart  0-6 Units Subcutaneous TID WC   insulin  aspart  10 Units Subcutaneous TID WC   sevelamer  carbonate  1,600 mg Oral TID WC   sodium chloride  flush  3 mL Intravenous Q12H   sodium chloride  flush  3 mL Intravenous Q12H   sodium zirconium cyclosilicate   10 g Oral Once    Dialysis Orders:  [facility] [address]  MWF 3h   84.5kg   B400   TDC    Heparin  2000 + 600u/hr  Assessment/Plan: CHB: work up per [employer]. S/p leadless PPM placement 7/10.  ESRD: MWF per schedule. Hyperkalemia noted.  Patient is refusing to have dialysis today. Understands risks of hyperkalemia and she states I'm not worried. Will get [facility] today and has dialysis scheduled at her outpatient clinic tomorrow.  HTN: BP fairly well  controlled. No antihypertensive medications on home med list Volume: UF to EDW as tolerated  Anemia esrd: Hb 10-11 range, no esa needs. 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 Continue sevelamer  binder  Dispo: Ok for discharge from renal standpoint after procedure.   Maisie Ronnald Acosta PA-C [facility] 03/10/2024,9:10 AM

## 2024-03-10 NOTE — Progress Notes (Signed)
 Pt. Stated she is going home and having dialysis tomorrow  Pt. Stated she will take care of herself. Pt. Stated her dialysis will take care of her Potasium and refused 10 mg. Of Albuterol  nebulizer.

## 2024-03-10 NOTE — TOC Transition Note (Signed)
 Transition of Care [facility]) - Discharge Note   Patient Details  Name: [name] MRN: [id number] Date of Birth: 1966/05/28  [facility] CM/SW Contact:  Waddell Barnie Rama, RN Phone Number: 03/10/2024, 11:10 AM   Clinical Narrative:    For dc today, patient has no PCP, she and her spouse states they will take care of getting one for her at the clinic where they live.          Patient Goals and CMS Choice            Discharge Placement                       Discharge Plan and Services Additional resources added to the After Visit Summary for                                       Social Drivers of Health (SDOH) Interventions SDOH Screenings   Food Insecurity: No Food Insecurity (03/02/2024)  Housing: Patient Declined (03/06/2024)  Transportation Needs: No Transportation Needs (03/02/2024)  Utilities: Not At Risk (03/02/2024)  Alcohol Screen: Low Risk  (04/04/2019)  Depression (PHQ2-9): Low Risk  (04/04/2019)  Financial Resource Strain: Low Risk  (03/07/2018)  Physical Activity: Unknown (01/03/2019)  Social Connections: Moderately Integrated (03/07/2018)  Stress: No Stress Concern Present (03/07/2018)  Tobacco Use: Low Risk  (03/02/2024)     Readmission Risk Interventions    03/03/2024    3:31 PM  Readmission Risk Prevention Plan  Transportation Screening Complete  PCP or Specialist Appt within 5-7 Days --  Home Care Screening Complete  Medication Review (RN CM) Complete

## 2024-03-10 NOTE — Progress Notes (Addendum)
 [facility] night cross cover note:   I was notified by the patient's RN of this patient's potassium level this morning of 6.3, which is up from most recent prior value of 4.3 yesterday morning.  This morning's BMP is also notable for bicarbonate 22, anion gap 15, as well as glucose 362.  This is an ESRD pt on HD, who is scheduled for her routine HD today. In the meantime, with obtain updated EKG and pursue shifting intervention, including Lokelma  x 1 dose now, 1 amp of IV bicarbonate x 1 now, and Novolog  5 units iv x 1 now, with repeat cbg to be checked in about one hour.  I have also ordered repeat BMP to be checked around 9 AM this morning.   Update: while nephrology note indicates that next HD will occur today (per MWF schedule), the patient conveys her preference for HD to occur tomorrow (Saturday). Additionally, patient currently refusing dose of lokelma  but is amenable to aforementioned dose of iv novolog . Will add one time order for albuterol  nebulizer to help further shift her elevated serum potassium.     [name] Pore, DO Hospitalist

## 2024-03-10 NOTE — Procedures (Signed)
 Attempted to get report from floor nurse, nurse reported that patient is refusing dialysis today. Nephrology PA aware.

## 2024-03-10 NOTE — Progress Notes (Addendum)
 Latest Reference Range & Units 03/09/24 16:02 03/10/24 02:38  Potassium 3.5 - 5.1 mmol/L 4.3 6.3 (HH)  (HH): Data is critically high  Dr Marcene notified and order received for critical K+. Pt refused Lokelma  at this time but agreed to extra 5 units of insulin . AM EKG done and MD reviewed.

## 2024-03-10 NOTE — Inpatient Diabetes Management (Signed)
 Inpatient Diabetes Program Recommendations  AACE/ADA: New Consensus Statement on Inpatient Glycemic Control   Target Ranges:  Prepandial:   less than 140 mg/dL      Peak postprandial:   less than 180 mg/dL (1-2 hours)      Critically ill patients:  140 - 180 mg/dL    Latest Reference Range & Units 03/09/24 07:06 03/09/24 10:37 03/09/24 19:10 03/09/24 22:26 03/10/24 05:54 03/10/24 08:05  Glucose-Capillary 70 - 99 mg/dL 816 (H) 811 (H) 787 (H) 359 (H) 393 (H) 345 (H)    Review of Glycemic Control  Diabetes history: DM2 Outpatient Diabetes medications: Novolog  14 units TID with meals Current orders for Inpatient glycemic control: Novolog  0-6 units TID with meals, Novolog  0-5 units at bedtime, Novolog  10 units TID with meals  Inpatient Diabetes Program Recommendations:    Insulin : Patient received Solumedrol 125 mg x1 on 7/10 which is contributing to hyperglycemia.  If CBGs remain consistently over 180 mg/d, may want to consider ordering Semglee  5 units Q24H.  Thanks, Earnie Gainer, RN, MSN, CDCES Diabetes Coordinator Inpatient Diabetes Program [phone] (Team Pager from 8am to 5pm)

## 2024-03-10 NOTE — Progress Notes (Addendum)
  Patient Name: [name] Date of Encounter: 03/10/2024  Primary Cardiologist: None Electrophysiologist: None  Interval Summary   In great spirits this am. No new complaints and already feels better.   Vital Signs    Vitals:   03/09/24 1915 03/09/24 1925 03/09/24 2318 03/10/24 0240  BP: 137/82 (!) 147/69 132/66 137/69  Pulse: 62 (!) 59 (!) 55 (!) 53  Resp: (!) 23 20 17 18   Temp:   97.8 F (36.6 C)   TempSrc:   Oral   SpO2: 94% 95% 96% 97%  Weight:      Height:        Intake/Output Summary (Last 24 hours) at 03/10/2024 0727 Last data filed at 03/10/2024 0500 Gross per 24 hour  Intake 850 ml  Output 5 ml  Net 845 ml   Filed Weights   03/08/24 0423 03/08/24 0821 03/09/24 0530  Weight: 86.5 kg 86.5 kg 88.5 kg    Physical Exam    GEN- NAD, Alert and oriented  Lungs- Clear to ausculation bilaterally, normal work of breathing Cardiac- Regular rate and rhythm, no murmurs, rubs or gallops GI- soft, NT, ND, + BS Extremities- no clubbing or cyanosis. No edema  Telemetry    AS-VP in 70s(personally reviewed)  Hospital Course    [name] is a 58 y.o. female with a hx of ESRD on MWF HD, prior CVA, hypertension, diabetes type, ?neuromuscular disorder, anemia chronic kidney disease, asthma who is being seen for the evaluation of complete heart block   Assessment & Plan    CHB Symptomatic bradycardia Now s/p MDT Micra AV 2.  Wound care and restrictions reviewed with pt.  CXR looks OK.  Usually follow up in place.    ESRD Timing per nephrology with hyper K.    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  Volume status per HD.   Stitch removed from R groin without incident.   EP will see as needed while remains here.  For questions or updates, please contact [facility] Please consult [email or web address] for contact info under     Signed, Ozell Prentice Passey, PA-C  03/10/2024, 7:27 AM

## 2024-03-10 NOTE — Plan of Care (Signed)

## 2024-03-14 MED FILL — Fentanyl Citrate Preservative Free (PF) Inj 100 MCG/2ML: INTRAMUSCULAR | Qty: 2 | Status: CN

## 2024-03-15 MED FILL — Fentanyl Citrate Preservative Free (PF) Inj 100 MCG/2ML: INTRAMUSCULAR | Qty: 2 | Status: AC

## 2024-03-16 NOTE — Telephone Encounter (Signed)
 LMOVM for pt to give [place] a call back.

## 2024-03-16 NOTE — Telephone Encounter (Signed)
 Spoke with pt we have received transmission and pt is doing well. She has removed outer bandage and has DC direct # if any more questions arise

## 2024-03-19 ENCOUNTER — Emergency Department

## 2024-03-19 ENCOUNTER — Other Ambulatory Visit: Payer: Self-pay

## 2024-03-19 ENCOUNTER — Inpatient Hospital Stay
Admission: EM | Admit: 2024-03-19 | Discharge: 2024-03-21 | DRG: 640 | Disposition: A | Discharge status: Home / Self Care | Attending: Internal Medicine | Admitting: Internal Medicine

## 2024-03-19 DIAGNOSIS — Z8249 Family history of ischemic heart disease and other diseases of the circulatory system: Secondary | ICD-10-CM

## 2024-03-19 DIAGNOSIS — Z811 Family history of alcohol abuse and dependence: Secondary | ICD-10-CM

## 2024-03-19 DIAGNOSIS — Z79899 Other long term (current) drug therapy: Secondary | ICD-10-CM

## 2024-03-19 DIAGNOSIS — Z809 Family history of malignant neoplasm, unspecified: Secondary | ICD-10-CM

## 2024-03-19 DIAGNOSIS — E1142 Type 2 diabetes mellitus with diabetic polyneuropathy: Secondary | ICD-10-CM | POA: Diagnosis present

## 2024-03-19 DIAGNOSIS — I442 Atrioventricular block, complete: Secondary | ICD-10-CM | POA: Diagnosis present

## 2024-03-19 DIAGNOSIS — Z950 Presence of cardiac pacemaker: Secondary | ICD-10-CM

## 2024-03-19 DIAGNOSIS — D631 Anemia in chronic kidney disease: Secondary | ICD-10-CM | POA: Diagnosis present

## 2024-03-19 DIAGNOSIS — I5022 Chronic systolic (congestive) heart failure: Secondary | ICD-10-CM | POA: Diagnosis present

## 2024-03-19 DIAGNOSIS — E8770 Fluid overload, unspecified: Principal | ICD-10-CM | POA: Diagnosis present

## 2024-03-19 DIAGNOSIS — N2581 Secondary hyperparathyroidism of renal origin: Secondary | ICD-10-CM | POA: Diagnosis present

## 2024-03-19 DIAGNOSIS — Z833 Family history of diabetes mellitus: Secondary | ICD-10-CM

## 2024-03-19 DIAGNOSIS — Z825 Family history of asthma and other chronic lower respiratory diseases: Secondary | ICD-10-CM

## 2024-03-19 DIAGNOSIS — I5020 Unspecified systolic (congestive) heart failure: Secondary | ICD-10-CM | POA: Insufficient documentation

## 2024-03-19 DIAGNOSIS — I132 Hypertensive heart and chronic kidney disease with heart failure and with stage 5 chronic kidney disease, or end stage renal disease: Secondary | ICD-10-CM | POA: Diagnosis present

## 2024-03-19 DIAGNOSIS — I493 Ventricular premature depolarization: Secondary | ICD-10-CM | POA: Diagnosis present

## 2024-03-19 DIAGNOSIS — I081 Rheumatic disorders of both mitral and tricuspid valves: Secondary | ICD-10-CM | POA: Diagnosis present

## 2024-03-19 DIAGNOSIS — J810 Acute pulmonary edema: Secondary | ICD-10-CM

## 2024-03-19 DIAGNOSIS — E1122 Type 2 diabetes mellitus with diabetic chronic kidney disease: Secondary | ICD-10-CM | POA: Diagnosis present

## 2024-03-19 DIAGNOSIS — I10 Essential (primary) hypertension: Secondary | ICD-10-CM | POA: Diagnosis present

## 2024-03-19 DIAGNOSIS — I340 Nonrheumatic mitral (valve) insufficiency: Secondary | ICD-10-CM | POA: Insufficient documentation

## 2024-03-19 DIAGNOSIS — K219 Gastro-esophageal reflux disease without esophagitis: Secondary | ICD-10-CM | POA: Diagnosis present

## 2024-03-19 DIAGNOSIS — Z888 Allergy status to other drugs, medicaments and biological substances status: Secondary | ICD-10-CM

## 2024-03-19 DIAGNOSIS — Z794 Long term (current) use of insulin: Secondary | ICD-10-CM

## 2024-03-19 DIAGNOSIS — Z91041 Radiographic dye allergy status: Secondary | ICD-10-CM

## 2024-03-19 DIAGNOSIS — Z992 Dependence on renal dialysis: Secondary | ICD-10-CM

## 2024-03-19 DIAGNOSIS — Z96643 Presence of artificial hip joint, bilateral: Secondary | ICD-10-CM | POA: Diagnosis present

## 2024-03-19 DIAGNOSIS — Z8673 Personal history of transient ischemic attack (TIA), and cerebral infarction without residual deficits: Secondary | ICD-10-CM

## 2024-03-19 DIAGNOSIS — N186 End stage renal disease: Secondary | ICD-10-CM | POA: Diagnosis present

## 2024-03-19 DIAGNOSIS — R0602 Shortness of breath: Principal | ICD-10-CM

## 2024-03-19 LAB — BASIC METABOLIC PANEL WITH GFR
Anion gap: 20 — ABNORMAL HIGH (ref 5–15)
BUN: 49 mg/dL — ABNORMAL HIGH (ref 6–20)
CO2: 24 mmol/L (ref 22–32)
Calcium: 9.2 mg/dL (ref 8.9–10.3)
Chloride: 93 mmol/L — ABNORMAL LOW (ref 98–111)
Creatinine, Ser: 8.4 mg/dL — ABNORMAL HIGH (ref 0.44–1.00)
GFR, Estimated: 5 mL/min — ABNORMAL LOW (ref >60)
Glucose, Bld: 251 mg/dL — ABNORMAL HIGH (ref 70–99)
Potassium: 5.1 mmol/L (ref 3.5–5.1)
Sodium: 137 mmol/L (ref 135–145)

## 2024-03-19 LAB — CBC
HCT: 35.8 % — ABNORMAL LOW (ref 36.0–46.0)
Hemoglobin: 10.9 g/dL — ABNORMAL LOW (ref 12.0–15.0)
MCH: 31.5 pg (ref 26.0–34.0)
MCHC: 30.4 g/dL (ref 30.0–36.0)
MCV: 103.5 fL — ABNORMAL HIGH (ref 80.0–100.0)
Platelets: 203 K/uL (ref 150–400)
RBC: 3.46 MIL/uL — ABNORMAL LOW (ref 3.87–5.11)
RDW: 15.8 % — ABNORMAL HIGH (ref 11.5–15.5)
WBC: 7 K/uL (ref 4.0–10.5)
nRBC: 0 % (ref 0.0–0.2)

## 2024-03-19 NOTE — ED Triage Notes (Signed)
 Arrived pov for shob x1 week  Per pt I have low iron  and it makes me have shob when it's low. I received some iron  on Friday, but it has worn off and I'm feeling shortness of breath again.  Denies CP

## 2024-03-19 NOTE — ED Provider Notes (Signed)
 [facility] Provider Note    Event Date/Time   First MD Initiated Contact with Patient 03/19/24 2335     (approximate)   History   Shortness of Breath   HPI  [name] is a 58 y.o. female with history of end-stage renal disease on hemodialysis Monday, Wednesday and Friday, hypertension, CVA, complete heart block status post pacemaker who presents to the emergency department complaints of intermittent shortness of breath for the past few days worse today where she thought that she may not make it tonight when walking to the bathroom.  No chest pain or chest discomfort.  Has had bilateral leg swelling.  No history of PE or DVT.  No missed dialysis.  No fever or productive cough.  Echo on 03/03/2024 showed EF of 40 to 45%.   History provided by patient, husband.    Past Medical History:  Diagnosis Date   Anemia    vitamin d3 deficiency   Anxiety    Chronic kidney disease    End Stage Renal Disease   Diabetes mellitus without complication (HCC)    GERD (gastroesophageal reflux disease)    nothing over last few years   High serum parathyroid hormone (PTH)    checked through Dialysis   History of kidney stones 2000   Hypertension    Neuromuscular disorder (HCC)    neuropathy in feet   PONV (postoperative nausea and vomiting)    severe nausea requiring many doses of post op antiemetics   Stroke (HCC) 10/2017   thinks she had a series of mini strokes.right leg up to right side of face were numb. no loss of consciousness    Past Surgical History:  Procedure Laterality Date   ABDOMINAL HYSTERECTOMY  2007   APPLICATION OF WOUND VAC Left 11/15/2021   Procedure: APPLICATION OF WOUND VAC;  Surgeon: Krasinski, Kevin, MD;  Location: [facility] ORS;  Service: Orthopedics;  Laterality: Left;  Prevena 13cm    APPLICATION OF WOUND VAC Right 12/14/2021   Procedure: APPLICATION OF WOUND VAC;  Surgeon: Tobie Priest, MD;  Location: [facility] OR[facility];  Service: Orthopedics;   Laterality: Right;  [id number]   AV FISTULA INSERTION W/ RF MAGNETIC GUIDANCE N/A 12/08/2017   Procedure: AV FISTULA INSERTION W/RF MAGNETIC GUIDANCE;  Surgeon: Jama Cordella MATSU, MD;  Location: [facility];  Service: Cardiovascular;  Laterality: N/A;   DIALYSIS/PERMA CATHETER INSERTION Right 10/03/2021   Procedure: DIALYSIS/PERMA CATHETER INSERTION;  Surgeon: Jama Cordella MATSU, MD;  Location: [facility];  Service: Cardiovascular;  Laterality: Right;   HIP ARTHROPLASTY Left 09/29/2021   Procedure: ARTHROPLASTY BIPOLAR HIP (HEMIARTHROPLASTY);  Surgeon: Leora Lynwood SAUNDERS, MD;  Location: [facility] OR[facility];  Service: Orthopedics;  Laterality: Left;   HIP ARTHROPLASTY Right 12/14/2021   Procedure: ARTHROPLASTY BIPOLAR HIP (HEMIARTHROPLASTY);  Surgeon: Tobie Priest, MD;  Location: [facility] OR[facility];  Service: Orthopedics;  Laterality: Right;   INCISION AND DRAINAGE HIP Left 11/15/2021   Procedure: IRRIGATION AND DEBRIDEMENT LEFT HIP WOUND;  Surgeon: Marchia Drivers, MD;  Location: [facility] OR[facility];  Service: Orthopedics;  Laterality: Left;   PACEMAKER LEADLESS INSERTION N/A 03/09/2024   Procedure: PACEMAKER LEADLESS INSERTION;  Surgeon: Nancey Eulas BRAVO, MD;  Location: [facility];  Service: Cardiovascular;  Laterality: N/A;   QUADRICEPS TENDON REPAIR Right 10/16/2021   Procedure: REPAIR QUADRICEP TENDON;  Surgeon: Doll Skates, MD;  Location: [facility] OR;  Service: Orthopedics;  Laterality: Right;   ROTATOR CUFF REPAIR Right 2004   SHOULDER CLOSED REDUCTION Right 2004  UPPER EXTREMITY VENOGRAPHY Left 02/15/2018   Procedure: UPPER EXTREMITY VENOGRAPHY;  Surgeon: Jama Cordella MATSU, MD;  Location: [facility];  Service: Cardiovascular;  Laterality: Left;    MEDICATIONS:  Prior to Admission medications   Medication Sig Start Date End Date Taking? Authorizing Provider  albuterol  (VENTOLIN  HFA) 108 (90 Base) MCG/ACT inhaler Inhale 2 puffs into the lungs every 6 (six) hours as needed for wheezing or  shortness of breath. 02/20/24   [name], MD  NOVOLOG  FLEXPEN 100 UNIT/ML FlexPen Inject 14 Units into the skin 3 (three) times daily with meals.    [provider]  sevelamer  carbonate (RENVELA ) 800 MG tablet Take 2,400 mg by mouth 3 (three) times daily with meals. 12/18/23   [provider]  Vitamin D , Ergocalciferol , (DRISDOL ) 1.25 MG (50000 UNIT) CAPS capsule Take 1 capsule (50,000 Units total) by mouth every 7 (seven) days. Patient taking differently: Take 50,000 Units by mouth every 7 (seven) days. Monday 11/10/21   Pegge Toribio PARAS, PA-C    Physical Exam   Triage Vital Signs: ED Triage Vitals [03/19/24 2227]  Encounter Vitals Group     BP 127/76     Girls Systolic BP Percentile      Girls Diastolic BP Percentile      Boys Systolic BP Percentile      Boys Diastolic BP Percentile      Pulse Rate 83     Resp 18     Temp 97.7 F (36.5 C)     Temp Source Oral     SpO2 97 %     Weight      Height      Head Circumference      Peak Flow      Pain Score      Pain Loc      Pain Education      Exclude from Growth Chart     Most recent vital signs: Vitals:   03/19/24 2227 03/20/24 0042  BP: 127/76 132/85  Pulse: 83 79  Resp: 18 18  Temp: 97.7 F (36.5 C) 97.8 F (36.6 C)  SpO2: 97% 99%    CONSTITUTIONAL: Alert, responds appropriately to questions. Well-appearing; well-nourished HEAD: Normocephalic, atraumatic EYES: Conjunctivae clear, pupils appear equal, sclera nonicteric ENT: normal nose; moist mucous membranes NECK: Supple, normal ROM CARD: RRR; S1 and S2 appreciated RESP: Normal chest excursion without splinting or tachypnea; breath sounds clear and equal bilaterally; no wheezes, no rhonchi, no rales, no hypoxia or respiratory distress, speaking full sentences ABD/GI: Non-distended; soft, non-tender, no rebound, no guarding, no peritoneal signs BACK: The back appears normal EXT: Normal ROM in all joints; no deformity noted, 2+ pitting  edema bilateral lower extremities, no calf tenderness or calf swelling SKIN: Normal color for age and race; warm; no rash on exposed skin NEURO: Moves all extremities equally, normal speech PSYCH: The patient's mood and manner are appropriate.   ED Results / Procedures / Treatments   LABS: (all labs ordered are listed, but only abnormal results are displayed) Labs Reviewed  BASIC METABOLIC PANEL WITH GFR - Abnormal; Notable for the following components:      Result Value   Chloride 93 (*)    Glucose, Bld 251 (*)    BUN 49 (*)    Creatinine, Ser 8.40 (*)    GFR, Estimated 5 (*)    Anion gap 20 (*)    All other components within normal limits  CBC - Abnormal; Notable for the following components:  RBC 3.46 (*)    Hemoglobin 10.9 (*)    HCT 35.8 (*)    MCV 103.5 (*)    RDW 15.8 (*)    All other components within normal limits  BRAIN NATRIURETIC PEPTIDE - Abnormal; Notable for the following components:   B Natriuretic Peptide 2,966.4 (*)    All other components within normal limits  TROPONIN I (HIGH SENSITIVITY) - Abnormal; Notable for the following components:   Troponin I (High Sensitivity) 61 (*)    All other components within normal limits  D-DIMER, QUANTITATIVE     EKG:  EKG Interpretation Date/Time:  Sunday March 19 2024 22:31:24 EDT Ventricular Rate:  78 PR Interval:    QRS Duration:  194 QT Interval:  492 QTC Calculation: 560 R Axis:   242  Text Interpretation: Ventricular-paced rhythm with occasional Premature ventricular complexes Abnormal ECG When compared with ECG of 10-Mar-2024 02:22, Premature ventricular complexes are now Present Vent. rate has increased BY  23 BPM Confirmed by Neomi Neptune [phone]) on 03/19/2024 11:36:38 PM         RADIOLOGY: My personal review and interpretation of imaging: Chest x-ray shows pulmonary edema.  I have personally reviewed all radiology reports.   DG Chest 1 View Result Date: 03/19/2024 EXAM: 1 VIEW XRAY OF THE CHEST  03/19/2024 11:07:00 PM COMPARISON: 03/10/2024 CXR CLINICAL HISTORY: SOB (shortness of breath) [id number]. Arrived pov for shob x1 week; ; Per pt I have low iron  and it makes me have shob when it's low. I received some iron  on Friday, but it has worn off and I'm feeling shortness of breath again.; ; Denies CP FINDINGS: LUNGS AND PLEURA: Suspected mild perihilar edema. No focal pulmonary opacity. No pleural effusion. No pneumothorax. HEART AND MEDIASTINUM: Cardiomegaly. No acute abnormality of the cardiac and mediastinal silhouettes. BONES AND SOFT TISSUES: No acute osseous abnormality. Right IJ approach dual lumen central venous catheter in place with tip projecting at the right atrium. Implantable loop recorder noted. IMPRESSION: 1. Cardiomegaly and suspected mild perihilar edema. Electronically signed by: Pinkie Pebbles MD 03/19/2024 11:13 PM EDT RP Workstation: [id number]     PROCEDURES:  Critical Care performed: No     Procedures    IMPRESSION / MDM / ASSESSMENT AND PLAN / ED COURSE  I reviewed the triage vital signs and the nursing notes.    Patient here with shortness of breath.  The patient is on the cardiac monitor to evaluate for evidence of arrhythmia and/or significant heart rate changes.   DIFFERENTIAL DIAGNOSIS (includes but not limited to):   ACS, pulmonary edema, pneumonia, PE, anemia   Patient's presentation is most consistent with acute presentation with potential threat to life or bodily function.   PLAN: Labs show hemoglobin of 10.9.  Patient was concerned this could be related to a low iron  level.  Reports she has been on oral iron  at home.  She was feeling better until tonight.  Potassium of 5.1.  No missed dialysis sessions.  Chest x-ray reviewed and interpreted by myself and radiologist and shows pulmonary edema.  Will add on troponin, BNP.  She was also recently admitted to the hospital for complete heart block and had a pacemaker placed.  Due to her recent  hospitalization, surgery, she is higher risk for PE.  Will obtain D-dimer.  Patient no longer makes urine.  IV diuretics will not be helpful.  I feel she will need admission for diuresis.  This does not need to be done emergently.  Sats 100% on  room air currently.   MEDICATIONS GIVEN IN ED: Medications - No data to display   ED COURSE: Troponin minimally elevated at 61.  She denies any chest pain or chest discomfort.  This appears to be chronically elevated likely from her chronic kidney disease.  BNP is 2966.   CONSULTS:  Consulted and discussed patient's case with hospitalist, Dr. Lawence.  I have recommended admission and consulting physician agrees and will place admission orders.  Patient (and family if present) agree with this plan.   I reviewed all nursing notes, vitals, pertinent previous records.  All labs, EKGs, imaging ordered have been independently reviewed and interpreted by myself.    OUTSIDE RECORDS REVIEWED: Reviewed recent admission.   Echo 03/03/24:  IMPRESSIONS     1. Left ventricular ejection fraction, by estimation, is 40 to 45%. The  left ventricle has mildly decreased function. The left ventricle  demonstrates regional wall motion abnormalities (see scoring  diagram/findings for description). The left ventricular   internal cavity size was moderately dilated. Left ventricular diastolic  function could not be evaluated. Elevated left atrial pressure. The E/e'  is 17.   2. Right ventricular systolic function is low normal. The right  ventricular size is normal. increased. A catheter within the right atrium  is visualized. There is severely elevated pulmonary artery systolic  pressure.   3. The mitral valve is degenerative. Severe mitral valve regurgitation.  Mild mitral valve stenosis (MG , MVA (VTI) 1.07cm2, HR 46bpm, RVSP  ). Accuracy limited due to complete heart block. mitral stenosis.  Severe mitral annular calcification.   4. Tricuspid valve  regurgitation is severe.   5. The aortic valve is normal in structure. Aortic valve regurgitation is  not visualized. Aortic valve sclerosis with mild stenosis (peak velocity  2.19m/s, MG , AVA VTI 1.33cm2, DI 0.47). LCC is not very mobile.   6. Pulmonic valve regurgitation is moderate.   7. The inferior vena cava is dilated in size with <50% respiratory  variability, suggesting right atrial pressure of 15 mmHg.     FINAL CLINICAL IMPRESSION(S) / ED DIAGNOSES   Final diagnoses:  SOB (shortness of breath)  Acute pulmonary edema (HCC)     Rx / DC Orders   ED Discharge Orders     None        Note:  This document was prepared using Dragon voice recognition software and may include unintentional dictation errors.   Caliph Borowiak, Josette SAILOR, DO 03/20/24 [phone]

## 2024-03-20 DIAGNOSIS — K219 Gastro-esophageal reflux disease without esophagitis: Secondary | ICD-10-CM | POA: Diagnosis present

## 2024-03-20 DIAGNOSIS — I493 Ventricular premature depolarization: Secondary | ICD-10-CM | POA: Diagnosis present

## 2024-03-20 DIAGNOSIS — I081 Rheumatic disorders of both mitral and tricuspid valves: Secondary | ICD-10-CM | POA: Diagnosis present

## 2024-03-20 DIAGNOSIS — Z809 Family history of malignant neoplasm, unspecified: Secondary | ICD-10-CM | POA: Diagnosis not present

## 2024-03-20 DIAGNOSIS — E8770 Fluid overload, unspecified: Secondary | ICD-10-CM | POA: Diagnosis present

## 2024-03-20 DIAGNOSIS — Z79899 Other long term (current) drug therapy: Secondary | ICD-10-CM | POA: Diagnosis not present

## 2024-03-20 DIAGNOSIS — I340 Nonrheumatic mitral (valve) insufficiency: Secondary | ICD-10-CM | POA: Insufficient documentation

## 2024-03-20 DIAGNOSIS — I442 Atrioventricular block, complete: Secondary | ICD-10-CM | POA: Diagnosis present

## 2024-03-20 DIAGNOSIS — D631 Anemia in chronic kidney disease: Secondary | ICD-10-CM | POA: Diagnosis present

## 2024-03-20 DIAGNOSIS — E1122 Type 2 diabetes mellitus with diabetic chronic kidney disease: Secondary | ICD-10-CM | POA: Insufficient documentation

## 2024-03-20 DIAGNOSIS — Z825 Family history of asthma and other chronic lower respiratory diseases: Secondary | ICD-10-CM | POA: Diagnosis not present

## 2024-03-20 DIAGNOSIS — Z833 Family history of diabetes mellitus: Secondary | ICD-10-CM | POA: Diagnosis not present

## 2024-03-20 DIAGNOSIS — Z8673 Personal history of transient ischemic attack (TIA), and cerebral infarction without residual deficits: Secondary | ICD-10-CM

## 2024-03-20 DIAGNOSIS — I5022 Chronic systolic (congestive) heart failure: Secondary | ICD-10-CM | POA: Diagnosis present

## 2024-03-20 DIAGNOSIS — Z950 Presence of cardiac pacemaker: Secondary | ICD-10-CM | POA: Diagnosis not present

## 2024-03-20 DIAGNOSIS — I5023 Acute on chronic systolic (congestive) heart failure: Secondary | ICD-10-CM

## 2024-03-20 DIAGNOSIS — J810 Acute pulmonary edema: Secondary | ICD-10-CM | POA: Diagnosis present

## 2024-03-20 DIAGNOSIS — Z96643 Presence of artificial hip joint, bilateral: Secondary | ICD-10-CM | POA: Diagnosis present

## 2024-03-20 DIAGNOSIS — E1142 Type 2 diabetes mellitus with diabetic polyneuropathy: Secondary | ICD-10-CM | POA: Diagnosis present

## 2024-03-20 DIAGNOSIS — I132 Hypertensive heart and chronic kidney disease with heart failure and with stage 5 chronic kidney disease, or end stage renal disease: Secondary | ICD-10-CM | POA: Diagnosis present

## 2024-03-20 DIAGNOSIS — Z992 Dependence on renal dialysis: Secondary | ICD-10-CM

## 2024-03-20 DIAGNOSIS — Z91041 Radiographic dye allergy status: Secondary | ICD-10-CM | POA: Diagnosis not present

## 2024-03-20 DIAGNOSIS — Z888 Allergy status to other drugs, medicaments and biological substances status: Secondary | ICD-10-CM | POA: Diagnosis not present

## 2024-03-20 DIAGNOSIS — N2581 Secondary hyperparathyroidism of renal origin: Secondary | ICD-10-CM | POA: Diagnosis present

## 2024-03-20 DIAGNOSIS — N186 End stage renal disease: Secondary | ICD-10-CM | POA: Diagnosis present

## 2024-03-20 DIAGNOSIS — I5020 Unspecified systolic (congestive) heart failure: Secondary | ICD-10-CM | POA: Insufficient documentation

## 2024-03-20 DIAGNOSIS — Z794 Long term (current) use of insulin: Secondary | ICD-10-CM | POA: Diagnosis not present

## 2024-03-20 DIAGNOSIS — Z8249 Family history of ischemic heart disease and other diseases of the circulatory system: Secondary | ICD-10-CM | POA: Diagnosis not present

## 2024-03-20 LAB — CBC
HCT: 34.6 % — ABNORMAL LOW (ref 36.0–46.0)
Hemoglobin: 10.6 g/dL — ABNORMAL LOW (ref 12.0–15.0)
MCH: 32.1 pg (ref 26.0–34.0)
MCHC: 30.6 g/dL (ref 30.0–36.0)
MCV: 104.8 fL — ABNORMAL HIGH (ref 80.0–100.0)
Platelets: 209 K/uL (ref 150–400)
RBC: 3.3 MIL/uL — ABNORMAL LOW (ref 3.87–5.11)
RDW: 15.9 % — ABNORMAL HIGH (ref 11.5–15.5)
WBC: 7.3 K/uL (ref 4.0–10.5)
nRBC: 0 % (ref 0.0–0.2)

## 2024-03-20 LAB — D-DIMER, QUANTITATIVE: D-Dimer, Quant: 0.82 ug{FEU}/mL — ABNORMAL HIGH (ref 0.00–0.50)

## 2024-03-20 LAB — BRAIN NATRIURETIC PEPTIDE: B Natriuretic Peptide: 2966.4 pg/mL — ABNORMAL HIGH (ref 0.0–100.0)

## 2024-03-20 LAB — BASIC METABOLIC PANEL WITH GFR
Anion gap: 20 — ABNORMAL HIGH (ref 5–15)
BUN: 52 mg/dL — ABNORMAL HIGH (ref 6–20)
CO2: 25 mmol/L (ref 22–32)
Calcium: 9.2 mg/dL (ref 8.9–10.3)
Chloride: 91 mmol/L — ABNORMAL LOW (ref 98–111)
Creatinine, Ser: 8.94 mg/dL — ABNORMAL HIGH (ref 0.44–1.00)
GFR, Estimated: 5 mL/min — ABNORMAL LOW (ref >60)
Glucose, Bld: 216 mg/dL — ABNORMAL HIGH (ref 70–99)
Potassium: 4.4 mmol/L (ref 3.5–5.1)
Sodium: 136 mmol/L (ref 135–145)

## 2024-03-20 LAB — GLUCOSE, CAPILLARY
Glucose-Capillary: 206 mg/dL — ABNORMAL HIGH (ref 70–99)
Glucose-Capillary: 213 mg/dL — ABNORMAL HIGH (ref 70–99)
Glucose-Capillary: 263 mg/dL — ABNORMAL HIGH (ref 70–99)
Glucose-Capillary: 200 mg/dL — ABNORMAL HIGH (ref 70–99)

## 2024-03-20 LAB — TROPONIN I (HIGH SENSITIVITY): Troponin I (High Sensitivity): 61 ng/L — ABNORMAL HIGH (ref <18)

## 2024-03-20 MED ORDER — SODIUM CHLORIDE 0.9% FLUSH: 10.0000 mL | INTRAVENOUS | Status: DC | PRN

## 2024-03-20 MED ORDER — CHLORHEXIDINE GLUCONATE CLOTH 2 % EX PADS
6.0000 | MEDICATED_PAD | Freq: Every day | CUTANEOUS | Status: DC
  Administered 2024-03-20: 6 via TOPICAL

## 2024-03-20 MED ORDER — HEPARIN SODIUM (PORCINE) 1000 UNIT/ML IJ SOLN: 1000.0000 [IU] | INTRAMUSCULAR | Status: DC | PRN

## 2024-03-20 MED ORDER — ONDANSETRON HCL 4 MG PO TABS: 4.0000 mg | ORAL_TABLET | Freq: Four times a day (QID) | ORAL | Status: DC | PRN

## 2024-03-20 MED ORDER — ALBUTEROL SULFATE (2.5 MG/3ML) 0.083% IN NEBU: 2.5000 mg | INHALATION_SOLUTION | Freq: Four times a day (QID) | RESPIRATORY_TRACT | Status: DC | PRN

## 2024-03-20 MED ORDER — VITAMIN D (ERGOCALCIFEROL) 1.25 MG (50000 UNIT) PO CAPS: 50000.0000 [IU] | ORAL_CAPSULE | ORAL | Status: DC

## 2024-03-20 MED ORDER — SODIUM CHLORIDE 0.9% FLUSH
10.0000 mL | Freq: Two times a day (BID) | INTRAVENOUS | Status: DC
  Administered 2024-03-20 – 2024-03-21 (×4): 10 mL

## 2024-03-20 MED ORDER — HEPARIN SODIUM (PORCINE) 5000 UNIT/ML IJ SOLN
5000.0000 [IU] | Freq: Three times a day (TID) | INTRAMUSCULAR | Status: DC
  Filled 2024-03-20 (×2): qty 1

## 2024-03-20 MED ORDER — INSULIN ASPART 100 UNIT/ML IJ SOLN
0.0000 [IU] | Freq: Three times a day (TID) | INTRAMUSCULAR | Status: DC
  Administered 2024-03-20: 5 [IU] via SUBCUTANEOUS
  Filled 2024-03-20: qty 1

## 2024-03-20 MED ORDER — DEXTROMETHORPHAN POLISTIREX ER 30 MG/5ML PO SUER
15.0000 mg | Freq: Two times a day (BID) | ORAL | Status: DC | PRN
  Administered 2024-03-20: 15 mg via ORAL
  Filled 2024-03-20 (×2): qty 5

## 2024-03-20 MED ORDER — HEPARIN SODIUM (PORCINE) 1000 UNIT/ML DIALYSIS: 1000.0000 [IU] | INTRAMUSCULAR | Status: DC | PRN

## 2024-03-20 MED ORDER — INSULIN ASPART 100 UNIT/ML IJ SOLN: 0.0000 [IU] | Freq: Every day | INTRAMUSCULAR | Status: DC

## 2024-03-20 MED ORDER — HEPARIN SODIUM (PORCINE) 1000 UNIT/ML IJ SOLN
INTRAMUSCULAR | Status: AC
  Filled 2024-03-20: qty 10

## 2024-03-20 MED ORDER — ACETAMINOPHEN 650 MG RE SUPP: 650.0000 mg | Freq: Four times a day (QID) | RECTAL | Status: DC | PRN

## 2024-03-20 MED ORDER — TRAZODONE HCL 50 MG PO TABS: 25.0000 mg | ORAL_TABLET | Freq: Every evening | ORAL | Status: DC | PRN

## 2024-03-20 MED ORDER — ALTEPLASE 2 MG IJ SOLR: 2.0000 mg | Freq: Once | INTRAMUSCULAR | Status: DC | PRN

## 2024-03-20 MED ORDER — INSULIN ASPART 100 UNIT/ML IJ SOLN
0.0000 [IU] | Freq: Three times a day (TID) | INTRAMUSCULAR | Status: DC
  Administered 2024-03-20: 5 [IU] via SUBCUTANEOUS
  Administered 2024-03-20 – 2024-03-21 (×3): 3 [IU] via SUBCUTANEOUS
  Filled 2024-03-20 (×4): qty 1

## 2024-03-20 MED ORDER — ONDANSETRON HCL 4 MG/2ML IJ SOLN
4.0000 mg | Freq: Four times a day (QID) | INTRAMUSCULAR | Status: DC | PRN
Start: 2024-03-20 — End: 2024-03-21

## 2024-03-20 MED ORDER — ACETAMINOPHEN 325 MG PO TABS
650.0000 mg | ORAL_TABLET | Freq: Four times a day (QID) | ORAL | Status: DC | PRN
  Administered 2024-03-21: 650 mg via ORAL
  Filled 2024-03-20: qty 2

## 2024-03-20 MED ORDER — MAGNESIUM HYDROXIDE 400 MG/5ML PO SUSP: 30.0000 mL | Freq: Every day | ORAL | Status: DC | PRN

## 2024-03-20 MED ORDER — SEVELAMER CARBONATE 800 MG PO TABS
2400.0000 mg | ORAL_TABLET | Freq: Three times a day (TID) | ORAL | Status: DC
  Administered 2024-03-20 – 2024-03-21 (×4): 2400 mg via ORAL
  Filled 2024-03-20 (×4): qty 3

## 2024-03-20 MED ORDER — FUROSEMIDE 10 MG/ML IJ SOLN
40.0000 mg | Freq: Two times a day (BID) | INTRAMUSCULAR | Status: DC
  Administered 2024-03-20: 40 mg via INTRAVENOUS
  Filled 2024-03-20: qty 4

## 2024-03-20 NOTE — Plan of Care (Signed)

## 2024-03-20 NOTE — Progress Notes (Signed)
 [facility]  ROUNDING NOTE   Subjective:   [name] is a 58 year old female with past medical conditions including anemia, GERD, hypertension, stroke, diabetes, and end-stage renal disease on hemodialysis.  Patient presents to the emergency department with shortness of breath and has been admitted under observation for Acute pulmonary edema (HCC) [J81.0] SOB (shortness of breath) [R06.02] Fluid overload [E87.70]  Patient is known to our practice and receives outpatient dialysis treatments at [facility] MWF schedule, supervised by Dr. Marcelino.  Last treatment completed on Friday.  Patient states she received a full treatment on Friday along with iron  supplementation.  States she felt well on Saturday but Sunday she began to feel bad.  Reports fatigue and weakness.  Reports shortness of breath, room air at baseline.  Labs on ED arrival unremarkable for renal patient.  BNP 2900 with troponin 61.  Hemoglobin 10.9.  Chest x-ray shows cardiomegaly with suspected mild perihilar edema.  We have been consulted to manage dialysis needs.   Objective:  Vital signs in last 24 hours:  Temp:  [97.5 F (36.4 C)-98.6 F (37 C)] 97.6 F (36.4 C) (07/21 0854) Pulse Rate:  [70-83] 70 (07/21 1100) Resp:  [16-23] 22 (07/21 1222) BP: (88-132)/(66-95) 88/74 (07/21 1222) SpO2:  [86 %-100 %] 100 % (07/21 1222) Weight:  [86.6 kg-87.3 kg] 86.6 kg (07/21 0854)  Weight change:  Filed Weights   03/20/24 0110 03/20/24 0854  Weight: 87.3 kg 86.6 kg    Intake/Output: No intake/output data recorded.   Intake/Output this shift:  No intake/output data recorded.  Physical Exam: General: NAD  Head: Normocephalic, atraumatic. Moist oral mucosal membranes  Eyes: Anicteric  Neck: Supple  Lungs:  Clear to auscultation  Heart: Regular rate and rhythm  Abdomen:  Soft, nontender  Extremities: Trace to 1+ peripheral edema.  Neurologic: Awake, alert, conversant  Skin: Warm,dry, no rash   Access: Right IJ PermCath    Basic Metabolic Panel: Recent Labs  Lab 03/19/24 2233 03/20/24 0603  NA 137 136  K 5.1 4.4  CL 93* 91*  CO2 24 25  GLUCOSE 251* 216*  BUN 49* 52*  CREATININE 8.40* 8.94*  CALCIUM  9.2 9.2    Liver Function Tests: No results for input(s): AST, ALT, ALKPHOS, BILITOT, PROT, ALBUMIN in the last 168 hours. No results for input(s): LIPASE, AMYLASE in the last 168 hours. No results for input(s): AMMONIA in the last 168 hours.  CBC: Recent Labs  Lab 03/19/24 2233 03/20/24 0603  WBC 7.0 7.3  HGB 10.9* 10.6*  HCT 35.8* 34.6*  MCV 103.5* 104.8*  PLT 203 209    Cardiac Enzymes: No results for input(s): CKTOTAL, CKMB, CKMBINDEX, TROPONINI in the last 168 hours.  BNP: Invalid input(s): POCBNP  CBG: Recent Labs  Lab 03/20/24 0728  GLUCAP 206*    Microbiology: Results for orders placed or performed during the hospital encounter of 12/14/21  Surgical pcr screen     Status: None   Collection Time: 12/14/21 12:17 AM   Specimen: Nasal Mucosa; Nasal Swab  Result Value Ref Range Status   MRSA, PCR NEGATIVE NEGATIVE Final   Staphylococcus aureus NEGATIVE NEGATIVE Final    Comment: (NOTE) The Xpert SA Assay (FDA approved for NASAL specimens in patients 43 years of age and older), is one component of a comprehensive surveillance program. It is not intended to diagnose infection nor to guide or monitor treatment. Performed at [facility], [address] [place], [place] [address]     Coagulation Studies:  No results for input(s): LABPROT, INR in the last 72 hours.  Urinalysis: No results for input(s): COLORURINE, LABSPEC, PHURINE, GLUCOSEU, HGBUR, BILIRUBINUR, KETONESUR, PROTEINUR, UROBILINOGEN, NITRITE, LEUKOCYTESUR in the last 72 hours.  Invalid input(s): APPERANCEUR    Imaging: DG Chest 1 View Result Date: 03/19/2024 EXAM: 1 VIEW XRAY OF THE CHEST 03/19/2024 11:07:00  PM COMPARISON: 03/10/2024 CXR CLINICAL HISTORY: SOB (shortness of breath) 141880. Arrived pov for shob x1 week; ; Per pt I have low iron  and it makes me have shob when it's low. I received some iron  on Friday, but it has worn off and I'm feeling shortness of breath again.; ; Denies CP FINDINGS: LUNGS AND PLEURA: Suspected mild perihilar edema. No focal pulmonary opacity. No pleural effusion. No pneumothorax. HEART AND MEDIASTINUM: Cardiomegaly. No acute abnormality of the cardiac and mediastinal silhouettes. BONES AND SOFT TISSUES: No acute osseous abnormality. Right IJ approach dual lumen central venous catheter in place with tip projecting at the right atrium. Implantable loop recorder noted. IMPRESSION: 1. Cardiomegaly and suspected mild perihilar edema. Electronically signed by: Pinkie Pebbles MD 03/19/2024 11:13 PM EDT RP Workstation: [id number]     Medications:     Chlorhexidine  Gluconate Cloth  6 each Topical Daily   heparin   5,000 Units Subcutaneous Q8H   insulin  aspart  0-5 Units Subcutaneous QHS   insulin  aspart  0-9 Units Subcutaneous TID WC   sevelamer  carbonate  2,400 mg Oral TID WC   sodium chloride  flush  10-40 mL Intracatheter Q12H   [START ON 03/26/2024] Vitamin D  (Ergocalciferol )  50,000 Units Oral Q7 days   acetaminophen  **OR** acetaminophen , albuterol , alteplase , heparin , heparin  sodium (porcine), magnesium  hydroxide, ondansetron  **OR** ondansetron  (ZOFRAN ) IV, sodium chloride  flush, traZODone   Assessment/ Plan:  [name] is a 58 y.o.  female with past medical conditions including anemia, GERD, hypertension, stroke, diabetes, and end-stage renal disease on hemodialysis.  Patient presents to the emergency department with shortness of breath and has been admitted under observation for Acute pulmonary edema (HCC) [J81.0] SOB (shortness of breath) [R06.02] Fluid overload [E87.70]  [facility]/MWF/Rt internal jugular permcath  Acute respiratory failure,  Require supplemental oxygen.  Room air at baseline, has required 3 L during this admission.Chest x-ray shows cardiomegaly with suspected mild perihilar edema.  Will attempt additional fluid removal with dialysis today.  2.  End-stage renal disease on hemodialysis.  Last treatment completed on Friday.  Will receive scheduled dialysis today.  Patient requested UF increased from 1.5 to 2.8 L.  2.8 L attempted however patient began to experience severe cramping around 2.2 L.  Cramping managed with saline boluses.  At end of treatment, UF 1.5 L achieved.  Next treatment scheduled for Wednesday.  3. Anemia of chronic kidney disease Lab Results  Component Value Date   HGB 10.6 (L) 03/20/2024    Hemoglobin within optimal range, 10.6.  No need of ESA's at this time.  Will consider iron  supplementation with dialysis.  4. Secondary Hyperparathyroidism: with outpatient labs: PTH 1276, phosphorus 6.4, calcium  8.1 on 03/13/24.   Lab Results  Component Value Date   CALCIUM  9.2 03/20/2024   CAION 1.00 (L) 03/09/2024   PHOS 6.3 (H) 03/08/2024    Currently prescribed sevelamer  with meals and weekly ergocalciferol    LOS: 0 Brookelin Felber 7/21/202512:56 PM

## 2024-03-20 NOTE — Progress Notes (Addendum)
 Interim progress note not for billing  Admitted earlier this morning by my colleague. I have seen and examined the patient, reviewed the chart, and agree with the assessment and plan unless stipulated otherwise.   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 Hasn't missed dialysis recently. CXR with pulmonary edema, bnp markedly elevated. Edematous on physical exam. Nephrology has been consulted and will plan on dialysis today likely with ultrafiltration. Given valvular pathology seen on recent TTE (severe MR, TR), also asked cardiology to review things. Per Dr. Mady, RHC would be the only way to know but he thinks this is likely volume mediated, advises first achieving euvolemia with hemodialys and then repeating TTE, as MR can be influenced by volume status and so the MR we saw on recent echo may be overestimated 2/2 her hypervolemic state. Doesn't appear med rec has yet been performed.

## 2024-03-20 NOTE — Progress Notes (Signed)
 Pt alert and oriented. Pt has gauze and tape covering dialysis catheter. Nurse educated pt on importance antimicrobial disc and hospital occlusive dressing. Pt verbalized understanding and stated that hospital dressing could be placed. Pt getting washed up in the bathroom at this time and requested for nurse to come back later to do the dressing change. Nurse reported off to [employer].

## 2024-03-20 NOTE — Inpatient Diabetes Management (Signed)
 Inpatient Diabetes Program Recommendations  AACE/ADA: New Consensus Statement on Inpatient Glycemic Control (2025)  Target Ranges:  Prepandial:   less than 140 mg/dL      Peak postprandial:   less than 180 mg/dL (1-2 hours)      Critically ill patients:  140 - 180 mg/dL   Lab Results  Component Value Date   GLUCAP 206 (H) 03/20/2024   HGBA1C 9.8 (H) 03/03/2024    Review of Glycemic Control  Latest Reference Range & Units 03/20/24 07:28 03/20/24 14:13  Glucose-Capillary 70 - 99 mg/dL 793 (H) 786 (H)   Diabetes history: DM 2 Outpatient Diabetes medications:  Novolog  14 units tid with meals  Current orders for Inpatient glycemic control:  Novolog  0-9 units tid with meals and HS Inpatient Diabetes Program Recommendations:    May consider adding Novolog  meal coverage 2-3 units tid with meals (hold if patient eats less than 50% or NPO).  Also consider adding Semglee  8 units daily.   Thanks,  Randall Bullocks, RN, BC-ADM Inpatient Diabetes Coordinator Pager [phone]  (8a-5p)

## 2024-03-20 NOTE — Plan of Care (Signed)

## 2024-03-20 NOTE — Progress Notes (Signed)
 PT Cancellation Note  Patient Details Name: [name] MRN: [id number] DOB: [birth year]   Cancelled Treatment:    Reason Eval/Treat Not Completed: Other (comment). Pt out of room at this time, PT to re-attempt as able.   Doyal Shams PT, DPT 10:05 AM,03/20/24

## 2024-03-20 NOTE — Assessment & Plan Note (Addendum)
-   This is associated with end-stage renal disease on hemodialysis and could be partly related to acute on chronic systolic CHF with most recent EF of 40 to 45% on 03/03/2024. - The patient will be admitted to a progressive unit observation bed. - She will be given 40 mg of IV Lasix  twice a day for pulmonary congestion. - Nephrology consult will be obtained to follow-up on hemodialysis. - I notified Dr. Korrapti about the patient.

## 2024-03-20 NOTE — Progress Notes (Signed)
 SLP Cancellation Note  Patient Details Name: [name] MRN: [id number] DOB: Jun 12, 1966   Cancelled treatment:       Reason Eval/Treat Not Completed: SLP screened, no needs identified, will sign off  Chart review completed. Orders received for cognitive linguistic evaluation. RN and OT denying overt cognitive concerns. Pt able to express wants/needs. No neuro imaging or concerns reported. No SLP services indicated at this time.   [place] Oden Lindaman Clapp, MS, CCC-SLP Speech Language Pathologist Rehab Services; [facility] [phone] (ascom)    [place] J Clapp 03/20/2024, 9:16 AM

## 2024-03-20 NOTE — Progress Notes (Signed)
 Pt alert and oriented. Pt has gauze and tape covering dialysis catheter. Pt previously informed day RN that antimicrobial disc and hospital occlusive dressing could be placed. Upon entering room to perform dressing change, the pt refused dressing stating that she does not want it on and if put on she will just take it back off. This RN educated pt on importance of antimicrobial dressing and occlusive covering. Pt voiced understanding but continues to refuse dressing change at this time.

## 2024-03-20 NOTE — Progress Notes (Signed)
 Heart Failure Navigator Progress Note  Assessed for Heart & Vascular TOC clinic readiness.  Patient does not meet criteria due to ESRD on Hemodialysis.  No HF TOC.   Navigator will sign off at this time.  Charmaine Pines, RN, BSN [facility] Heart Failure Navigator Secure Chat Only

## 2024-03-20 NOTE — Assessment & Plan Note (Signed)
-   The patient will be placed on supplemental coverage with NovoLog. 

## 2024-03-20 NOTE — H&P (Addendum)
 [place]   PATIENT NAME: [name]    MR#:  [id number]  DATE OF BIRTH:  [birth year]  DATE OF ADMISSION:  03/19/2024  PRIMARY CARE PHYSICIAN: Pcp, No   Patient is coming from: Home  REQUESTING/REFERRING PHYSICIAN: Ward, Josette SAILOR, DO  CHIEF COMPLAINT:   Chief Complaint  Patient presents with  . Shortness of Breath    HISTORY OF PRESENT ILLNESS:  [name] is a 58 y.o. Caucasian female with medical history significant for ESRD on HD on MWF, GERD, type 2 diabetes mellitus, hypertension and peripheral neuropathy, as well as CVA, who presented to the emergency room with acute onset of worsening dyspnea over the last 4 weeks which has gotten significantly worse since Sunday morning.  The patient admitted to associated dry cough without wheezing.  She has been having orthopnea and paroxysmal nocturnal dyspnea as well as dyspnea on exertion and worsening lower extremity edema.  She has been compliant with hemodialysis sessions and has not missed any.  No fever or chills.  No chest pain or palpitations.  ED Course: When she came to the ER, vital signs were within normal with normal pulse oximetry on home O2 at 2 L/min.  Labs revealed a blood glucose of 251 with chloride of 93 and BUN of 49 with creatinine of 8.4, anion gap of 20.  BNP was 20 966.4 and high-sensitivity troponin I was 61.  CBC showed anemia better than previous levels with macrocytosis.  D-dimer was 0.82.   EKG as reviewed by me : EKG showed ventricular paced rhythm with occasional PVCs with a rate of 78. Imaging: Portable chest x-ray showed cardiomegaly and suspected mild perihilar edema.  The patient will be admitted to a progressive unit observation bed for further evaluation and management. PAST MEDICAL HISTORY:   Past Medical History:  Diagnosis Date  . Anemia    vitamin d3 deficiency  . Anxiety   . Chronic kidney disease    End Stage Renal Disease  . Diabetes mellitus without complication (HCC)   .  GERD (gastroesophageal reflux disease)    nothing over last few years  . High serum parathyroid hormone (PTH)    checked through Dialysis  . History of kidney stones 2000  . Hypertension   . Neuromuscular disorder (HCC)    neuropathy in feet  . PONV (postoperative nausea and vomiting)    severe nausea requiring many doses of post op antiemetics  . Stroke (HCC) 10/2017   thinks she had a series of mini strokes.right leg up to right side of face were numb. no loss of consciousness    PAST SURGICAL HISTORY:   Past Surgical History:  Procedure Laterality Date  . ABDOMINAL HYSTERECTOMY  2007  . APPLICATION OF WOUND VAC Left 11/15/2021   Procedure: APPLICATION OF WOUND VAC;  Surgeon: Krasinski, Kevin, MD;  Location: [facility] ORS;  Service: Orthopedics;  Laterality: Left;  Prevena 13cm   . APPLICATION OF WOUND VAC Right 12/14/2021   Procedure: APPLICATION OF WOUND VAC;  Surgeon: Tobie Priest, MD;  Location: [facility] ORS;  Service: Orthopedics;  Laterality: Right;  [id number]  . AV FISTULA INSERTION W/ RF MAGNETIC GUIDANCE N/A 12/08/2017   Procedure: AV FISTULA INSERTION W/RF MAGNETIC GUIDANCE;  Surgeon: Jama Cordella MATSU, MD;  Location: [facility] INVASIVE CV LAB;  Service: Cardiovascular;  Laterality: N/A;  . DIALYSIS/PERMA CATHETER INSERTION Right 10/03/2021   Procedure: DIALYSIS/PERMA CATHETER INSERTION;  Surgeon: Jama Cordella MATSU, MD;  Location: [facility] INVASIVE CV LAB;  Service:  Cardiovascular;  Laterality: Right;  . HIP ARTHROPLASTY Left 09/29/2021   Procedure: ARTHROPLASTY BIPOLAR HIP (HEMIARTHROPLASTY);  Surgeon: Leora Lynwood SAUNDERS, MD;  Location: [facility] ORS;  Service: Orthopedics;  Laterality: Left;  . HIP ARTHROPLASTY Right 12/14/2021   Procedure: ARTHROPLASTY BIPOLAR HIP (HEMIARTHROPLASTY);  Surgeon: Tobie Priest, MD;  Location: [facility] ORS;  Service: Orthopedics;  Laterality: Right;  . INCISION AND DRAINAGE HIP Left 11/15/2021   Procedure: IRRIGATION AND DEBRIDEMENT LEFT HIP WOUND;  Surgeon: Marchia Drivers,  MD;  Location: [facility] ORS;  Service: Orthopedics;  Laterality: Left;  . PACEMAKER LEADLESS INSERTION N/A 03/09/2024   Procedure: PACEMAKER LEADLESS INSERTION;  Surgeon: Nancey Eulas BRAVO, MD;  Location: [facility]ASIVE CV LAB;  Service: Cardiovascular;  Laterality: N/A;  . QUADRICEPS TENDON REPAIR Right 10/16/2021   Procedure: REPAIR QUADRICEP TENDON;  Surgeon: Doll Skates, MD;  Location: [facility] OR;  Service: Orthopedics;  Laterality: Right;  . ROTATOR CUFF REPAIR Right 2004  . SHOULDER CLOSED REDUCTION Right 2004  . UPPER EXTREMITY VENOGRAPHY Left 02/15/2018   Procedure: UPPER EXTREMITY VENOGRAPHY;  Surgeon: Jama Cordella MATSU, MD;  Location: [facility] INVASIVE CV LAB;  Service: Cardiovascular;  Laterality: Left;    SOCIAL HISTORY:   Social History   Tobacco Use  . Smoking status: Never  . Smokeless tobacco: Never  Substance Use Topics  . Alcohol use: No    FAMILY HISTORY:   Family History  Problem Relation Age of Onset  . Emphysema Mother   . COPD Mother   . Cancer Father   . Cancer Paternal Aunt   . Diabetes Sister   . Hypertension Sister   . Eczema Sister   . Diabetes Brother   . Hypertension Brother   . Cardiomyopathy Brother   . Alcohol abuse Brother     DRUG ALLERGIES:   Allergies  Allergen Reactions  . Enalapril Hives and Other (See Comments)    Angioedema face.  . Ivp Dye [Iodinated Contrast Media] Hives  . Lisinopril Shortness Of Breath    REVIEW OF SYSTEMS:   ROS As per history of present illness. All pertinent systems were reviewed above. Constitutional, HEENT, cardiovascular, respiratory, GI, GU, musculoskeletal, neuro, psychiatric, endocrine, integumentary and hematologic systems were reviewed and are otherwise negative/unremarkable except for positive findings mentioned above in the HPI.   MEDICATIONS AT HOME:   Prior to Admission medications   Medication Sig Start Date End Date Taking? Authorizing Provider  albuterol  (VENTOLIN  HFA) 108 (90 Base) MCG/ACT  inhaler Inhale 2 puffs into the lungs every 6 (six) hours as needed for wheezing or shortness of breath. 02/20/24  Yes [name], MD  NOVOLOG  FLEXPEN 100 UNIT/ML FlexPen Inject 14 Units into the skin 3 (three) times daily with meals.   Yes [provider]  sevelamer  carbonate (RENVELA ) 800 MG tablet Take 2,400 mg by mouth 3 (three) times daily with meals. 12/18/23  Yes [provider]  Vitamin D , Ergocalciferol , (DRISDOL ) 1.25 MG (50000 UNIT) CAPS capsule Take 1 capsule (50,000 Units total) by mouth every 7 (seven) days. Patient taking differently: Take 50,000 Units by mouth every 7 (seven) days. Monday 11/10/21  Yes Angiulli, Toribio PARAS, PA-C      VITAL SIGNS:  Blood pressure (!) 125/91, pulse 79, temperature (!) 97.5 F (36.4 C), resp. rate 18, height 5' 6 (1.676 m), weight 87.3 kg, SpO2 100%.  PHYSICAL EXAMINATION:  Physical Exam  GENERAL:  58 y.o.-year-old patient lying in the bed with no acute distress.  EYES: Pupils equal, round, reactive to light and accommodation.  No scleral icterus. Extraocular muscles intact.  HEENT: Head atraumatic, normocephalic. Oropharynx and nasopharynx clear.  NECK:  Supple, no jugular venous distention. No thyroid  enlargement, no tenderness.  LUNGS: Diminished bibasal breath sounds with bibasilar rales.  No use of accessory muscles of respiration.  CARDIOVASCULAR: Regular rate and rhythm, S1, S2 normal.  2/6 systolic ejection murmur at the left lower sternal border with no rubs, or gallops.  ABDOMEN: Soft, nondistended, nontender. Bowel sounds present. No organomegaly or mass.  EXTREMITIES: 2+ right lower extremity soft pitting edema with no cyanosis, or clubbing.  NEUROLOGIC: Cranial nerves II through XII are intact. Muscle strength 5/5 in all extremities. Sensation intact. Gait not checked.  PSYCHIATRIC: The patient is alert and oriented x 3.  Normal affect and good eye contact. SKIN: No obvious rash, lesion, or ulcer.   LABORATORY  PANEL:   CBC Recent Labs  Lab 03/19/24 2233  WBC 7.0  HGB 10.9*  HCT 35.8*  PLT 203   ------------------------------------------------------------------------------------------------------------------  Chemistries  Recent Labs  Lab 03/19/24 2233  NA 137  K 5.1  CL 93*  CO2 24  GLUCOSE 251*  BUN 49*  CREATININE 8.40*  CALCIUM  9.2   ------------------------------------------------------------------------------------------------------------------  Cardiac Enzymes No results for input(s): TROPONINI in the last 168 hours. ------------------------------------------------------------------------------------------------------------------  RADIOLOGY:  DG Chest 1 View Result Date: 03/19/2024 EXAM: 1 VIEW XRAY OF THE CHEST 03/19/2024 11:07:00 PM COMPARISON: 03/10/2024 CXR CLINICAL HISTORY: SOB (shortness of breath) 141880. Arrived pov for shob x1 week; ; Per pt I have low iron  and it makes me have shob when it's low. I received some iron  on Friday, but it has worn off and I'm feeling shortness of breath again.; ; Denies CP FINDINGS: LUNGS AND PLEURA: Suspected mild perihilar edema. No focal pulmonary opacity. No pleural effusion. No pneumothorax. HEART AND MEDIASTINUM: Cardiomegaly. No acute abnormality of the cardiac and mediastinal silhouettes. BONES AND SOFT TISSUES: No acute osseous abnormality. Right IJ approach dual lumen central venous catheter in place with tip projecting at the right atrium. Implantable loop recorder noted. IMPRESSION: 1. Cardiomegaly and suspected mild perihilar edema. Electronically signed by: Pinkie Pebbles MD 03/19/2024 11:13 PM EDT RP Workstation: [id number]      IMPRESSION AND PLAN:  Assessment and Plan: * Fluid overload - This is associated with end-stage renal disease on hemodialysis and could be partly related to acute on chronic systolic CHF with most recent EF of 40 to 45% on 03/03/2024. - The patient will be admitted to a progressive unit  observation bed. - She will be given 40 mg of IV Lasix  twice a day for pulmonary congestion. - Nephrology consult will be obtained to follow-up on hemodialysis. - I notified Dr. Korrapti about the patient.  Type 2 diabetes mellitus with end-stage renal disease (HCC) - The patient will be placed on supplemental coverage with NovoLog .   DVT prophylaxis: SQ heparin  Advanced Care Planning:  Code Status: full code. Family Communication:  The plan of care was discussed in details with the patient (and family). I answered all questions. The patient agreed to proceed with the above mentioned plan. Further management will depend upon hospital course. Disposition Plan: Back to previous home environment Consults called: Nephrology All the records are reviewed and case discussed with ED provider.  Status is: Observation  I certify that at the time of admission, it is my clinical judgment that the patient will require hospital care extending less than 2 midnights.  Dispo: The patient is from: Home              Anticipated d/c is to: Home              Patient currently is not medically stable to d/c.              Difficult to place patient: No  Madison DELENA Peaches M.D on 03/20/2024 at 5:10 AM  [facility]   From 7 PM-7 AM, contact night-coverage [email or web address]  CC: Primary care physician; Pcp, No

## 2024-03-20 NOTE — Evaluation (Signed)
 Occupational Therapy Evaluation Patient Details Name: [name] MRN: [id number] DOB: 1966/07/11 Today's Date: 03/20/2024   History of Present Illness   Pt is a 58 y.o. female who presented to the ER with acute onset of worsening dyspnea over the last 4 weeks, as well as worsening lower extremity edema. Admitted for management of fluid overload PMH of ESRD on HD on MWF, GERD, DM2, hypertension, complete heart block s/p pacemaker and peripheral neuropathy, as well as CVA.     Clinical Impressions Pt was seen for OT evaluation this date. PTA, pt resides with her husband in a one level home where she reports being MOD I with ADLs, IADLs, driving herself to dialysis and using crutches to ambulate household distances. She utilizes an Art gallery manager for outdoor/community distances.  Pt presents to acute OT demonstrating impaired ADL performance and functional mobility 2/2 low activity tolerance. Pt currently requires MOD I for bed mobility and set up assist for seated grooming at EOB. Pt declining all further activity d/t not wanting to get worked up with her breathing again. She is on 2L on entry at 100%. MD in and pt placed on RA d/t no need for o2. Edu on ECS, pacing, and task simplification to maximize her return to PLOF. Pt has been independently performing transfers from bed<>W/C<>toilet while here, however she is typically ambulatory via crutches. Will follow acutely to ensure pt safely returns to [facility] and educate further on ECS to be implemented in ADL performance. Likely no OT follow up needed on DC.     If plan is discharge home, recommend the following:   Help with stairs or ramp for entrance;A little help with walking and/or transfers     Functional Status Assessment   Patient has had a recent decline in their functional status and demonstrates the ability to make significant improvements in function in a reasonable and predictable amount of time.     Equipment  Recommendations   None recommended by OT     Recommendations for Other Services         Precautions/Restrictions   Precautions Precautions: Fall Recall of Precautions/Restrictions: Intact Restrictions Weight Bearing Restrictions Per Provider Order: No     Mobility Bed Mobility Overal bed mobility: Modified Independent                  Transfers                   General transfer comment: pt declined standing or SPT this morning; has been doing this independently to go to the bathroom at W/C level      Balance                                           ADL either performed or assessed with clinical judgement   ADL Overall ADL's : Needs assistance/impaired     Grooming: Wash/dry face;Sitting;Set up                                 General ADL Comments: pt only agreeable to face washing at EOB this morning; reports she is taking herself to the bathroom in the W/C with nursing confirming     Vision         Perception         Praxis  Pertinent Vitals/Pain Pain Assessment Pain Assessment: No/denies pain     Extremity/Trunk Assessment Upper Extremity Assessment Upper Extremity Assessment: Overall WFL for tasks assessed   Lower Extremity Assessment Lower Extremity Assessment: Generalized weakness       Communication     Cognition Arousal: Alert Behavior During Therapy: WFL for tasks assessed/performed Cognition: No apparent impairments                               Following commands: Intact       Cueing  General Comments      100% sp02 on 2L; placed on RA with MD present   Exercises Other Exercises Other Exercises: Edu on role of OT in acute setting as well as incorporation of pacing, ECS, task simplification to prevent overexertion during ADLs and transfers/mobility.   Shoulder Instructions      Home Living                                           Prior Functioning/Environment                      OT Problem List: Decreased activity tolerance   OT Treatment/Interventions: Self-care/ADL training;Therapeutic exercise;Patient/family education;Energy conservation      OT Goals(Current goals can be found in the care plan section)   Acute Rehab OT Goals Patient Stated Goal: improve breathing OT Goal Formulation: With patient Time For Goal Achievement: 04/03/24 Potential to Achieve Goals: Good ADL Goals Additional ADL Goal #1: Pt will demo implementation of 1 learned ECS into ADL performance 2/2 trials to prevent overexertion and maximize return to PLOF.   OT Frequency:  Min 1X/week    Co-evaluation              AM-PAC OT 6 Clicks Daily Activity     Outcome Measure Help from another person eating meals?: None Help from another person taking care of personal grooming?: None Help from another person toileting, which includes using toliet, bedpan, or urinal?: None Help from another person bathing (including washing, rinsing, drying)?: A Little Help from another person to put on and taking off regular upper body clothing?: None Help from another person to put on and taking off regular lower body clothing?: None 6 Click Score: 23   End of Session Nurse Communication: Mobility status  Activity Tolerance: Patient tolerated treatment well Patient left: in bed;with call bell/phone within reach  OT Visit Diagnosis: Other abnormalities of gait and mobility (R26.89)                Time: 9191-9175 OT Time Calculation (min): 16 min Charges:  OT General Charges $OT Visit: 1 Visit OT Evaluation $OT Eval Low Complexity: 1 Low Anabella Capshaw, OTR/L  03/20/24, 12:31 PM  Leor Whyte E Annelisa Ryback 03/20/2024, 12:28 PM

## 2024-03-21 ENCOUNTER — Inpatient Hospital Stay

## 2024-03-21 DIAGNOSIS — J810 Acute pulmonary edema: Secondary | ICD-10-CM

## 2024-03-21 LAB — BASIC METABOLIC PANEL WITH GFR
Anion gap: 17 — ABNORMAL HIGH (ref 5–15)
BUN: 32 mg/dL — ABNORMAL HIGH (ref 6–20)
CO2: 23 mmol/L (ref 22–32)
Calcium: 9.4 mg/dL (ref 8.9–10.3)
Chloride: 92 mmol/L — ABNORMAL LOW (ref 98–111)
Creatinine, Ser: 6.32 mg/dL — ABNORMAL HIGH (ref 0.44–1.00)
GFR, Estimated: 7 mL/min — ABNORMAL LOW (ref >60)
Glucose, Bld: 188 mg/dL — ABNORMAL HIGH (ref 70–99)
Potassium: 4.1 mmol/L (ref 3.5–5.1)
Sodium: 132 mmol/L — ABNORMAL LOW (ref 135–145)

## 2024-03-21 LAB — GLUCOSE, CAPILLARY
Glucose-Capillary: 276 mg/dL — ABNORMAL HIGH (ref 70–99)
Glucose-Capillary: 234 mg/dL — ABNORMAL HIGH (ref 70–99)

## 2024-03-21 LAB — URIC ACID: Uric Acid, Serum: 6.4 mg/dL (ref 2.5–7.1)

## 2024-03-21 NOTE — TOC Transition Note (Addendum)
 Transition of Care [facility]) - Discharge Note   Patient Details  Name: Hareem Surowiec MRN: [id number] Date of Birth: 1965-12-26  [facility] of [facility]) CM/SW Contact:  Tomasa JAYSON Childes, RN Phone Number: 03/21/2024, 3:35 PM   Clinical Narrative:    Attempt to speak with patient regarding HH. Patient no longer on the floor per nurse. Attempted to reach patient. Left a message.     TOC signing off.           Patient Goals and CMS Choicef            Discharge Placement                       Discharge Plan and Services Additional resources added to the After Visit Summary for                                       Social Drivers of Health (SDOH) Interventions SDOH Screenings   Food Insecurity: No Food Insecurity (03/20/2024)  Housing: Unknown (03/20/2024)  Transportation Needs: No Transportation Needs (03/20/2024)  Utilities: Not At Risk (03/20/2024)  Alcohol Screen: Low Risk  (04/04/2019)  Depression (PHQ2-9): Low Risk  (04/04/2019)  Financial Resource Strain: Low Risk  (03/07/2018)  Physical Activity: Unknown (01/03/2019)  Social Connections: Moderately Integrated (03/07/2018)  Stress: No Stress Concern Present (03/07/2018)  Tobacco Use: Low Risk  (03/02/2024)     Readmission Risk Interventions    03/03/2024    3:31 PM  Readmission Risk Prevention Plan  Transportation Screening Complete  PCP or Specialist Appt within 5-7 Days --  Home Care Screening Complete  Medication Review (RN CM) Complete

## 2024-03-21 NOTE — Discharge Summary (Signed)
 [name] FMW:[id number] DOB: 1966-05-01 DOA: 03/19/2024  PCP: Pcp, No  Admit date: 03/19/2024  Discharge date: 03/21/2024  Admitted From: Home   disposition: Home   Recommendations for Outpatient Follow-up:   Follow up with PCP tomorrow.  Patient with swollen right wrist and 1st and 2nd MCP which patient did not want to get worked up in house.  This needs to be looked at ASAP.  Home Health: N/A Equipment/Devices: N/A Consultations: Nephrology Discharge Condition: Improved CODE STATUS: Full Diet Recommendation: Renal      Chief Complaint  Patient presents with   Shortness of Breath     Brief history of present illness from the day of admission and additional interim summary     58 y.o. Caucasian female with medical history significant for ESRD on HD on MWF, GERD, type 2 diabetes mellitus, hypertension and peripheral neuropathy, as well as CVA, who presented to the emergency room with acute onset of worsening dyspnea over the last 4 weeks which has gotten significantly worse since Sunday morning.  The patient admitted to associated dry cough without wheezing.  She has been having orthopnea and paroxysmal nocturnal dyspnea as well as dyspnea on exertion and worsening lower extremity edema.  She has been compliant with hemodialysis sessions and has not missed any.  No fever or chills.  No chest pain or palpitations.   ED Course: When she came to the ER, vital signs were within normal with normal pulse oximetry on home O2 at 2 L/min.  Labs revealed a blood glucose of 251 with chloride of 93 and BUN of 49 with creatinine of 8.4, anion gap of 20.  BNP was 20 966.4 and high-sensitivity troponin I was 61.  CBC showed anemia better than previous levels with macrocytosis.  D-dimer was 0.82.   EKG as reviewed by me : EKG  showed ventricular paced rhythm with occasional PVCs with a rate of 78. Imaging: Portable chest x-ray showed cardiomegaly and suspected mild perihilar edema.                                                                  Hospital Course   Patient was admitted for pulmonary edema in the setting of ESRD on HD.  Patient had not missed any dialysis sessions.  Patient underwent hemodialysis with ultrafiltration and felt markedly improved.  Patient requesting to go home the day after admission.  Overnight patient has developed tenderness on right wrist and right arm.  She also thinks there may be an area on her left fifth finger which is also tender and swollen.  On physical exam she is noted to have a boggy warm wrist with effusions on her right arm as well as swelling of her 1st and 2nd MCP.  There is minimal swelling of her fifth left  finger.  Patient denies any injury.  Notes that she woke up with this pain.  Denies any history of gout or pseudogout in the past.  I strongly urged patient to stay for evaluation including aspiration of the wrist as well as workup for rheumatoid arthritis.  Patient declined stating she would rather have this done as an outpatient.  I noted it would be extremely important for her to get this done expeditiously, patient states she was planning on seeing a physician to get this evaluated tomorrow.  X-ray ruled out any fractures.  Patient with known valvular abnormalities, severe MR and TR.  This was discussed with Dr. Mady, cardiology who noted this could be worked up as an outpatient, she will likely need a right heart cath after repeat echocardiogram.  Patient to follow-up with cardiology as an outpatient.   Discharge diagnosis     Principal Problem:   Fluid overload Active Problems:   ESRD (end stage renal disease) (HCC)   Essential hypertension   CHB (complete heart block) (HCC)   Type 2 diabetes mellitus with end-stage renal disease (HCC)   History of CVA  (cerebrovascular accident)   HFrEF (heart failure with reduced ejection fraction) (HCC)   Mitral regurgitation    Discharge instructions    Discharge Instructions     Discharge instructions   Complete by: As directed    It is going to be very important for you to get your wrist and knuckles looked at by a physician--swollen wrist and knuckles need to be worked up quickly.  Please make sure you see somebody for it by tomorrow at the latest.  Come back to the emergency room if you develop fevers or chills or significant worsening of your wrist/knuckle pain.  I have communicated with Dr. Marcelino as well.   Increase activity slowly   Complete by: As directed        Discharge Medications   Allergies as of 03/21/2024       Reactions   Enalapril Hives, Other (See Comments)   Angioedema face.   Ivp Dye [iodinated Contrast Media] Hives   Lisinopril Shortness Of Breath        Medication List     TAKE these medications    albuterol  108 (90 Base) MCG/ACT inhaler Commonly known as: VENTOLIN  HFA Inhale 2 puffs into the lungs every 6 (six) hours as needed for wheezing or shortness of breath.   NovoLOG  FlexPen 100 UNIT/ML FlexPen Generic drug: insulin  aspart Inject 14 Units into the skin 3 (three) times daily with meals.   sevelamer  carbonate 800 MG tablet Commonly known as: RENVELA  Take 2,400 mg by mouth 3 (three) times daily with meals.   Vitamin D  (Ergocalciferol ) 1.25 MG (50000 UNIT) Caps capsule Commonly known as: DRISDOL  Take 1 capsule (50,000 Units total) by mouth every 7 (seven) days. What changed: additional instructions          Major procedures and Radiology Reports - PLEASE review detailed and final reports thoroughly  -        DG Wrist 2 Views Right Result Date: 03/21/2024 CLINICAL DATA:  Right upper extremity pain. EXAM: RIGHT WRIST - 2 VIEW; RIGHT HAND - 2 VIEW COMPARISON:  None Available. FINDINGS: No acute fracture or dislocation. The bones are  osteopenic. The soft tissues are unremarkable. Vascular calcifications noted. IMPRESSION: 1. No acute fracture or dislocation. 2. Osteopenia. Electronically Signed   By: Vanetta Chou M.D.   On: 03/21/2024 13:15   DG Hand 2 View Right  Result Date: 03/21/2024 CLINICAL DATA:  Right upper extremity pain. EXAM: RIGHT WRIST - 2 VIEW; RIGHT HAND - 2 VIEW COMPARISON:  None Available. FINDINGS: No acute fracture or dislocation. The bones are osteopenic. The soft tissues are unremarkable. Vascular calcifications noted. IMPRESSION: 1. No acute fracture or dislocation. 2. Osteopenia. Electronically Signed   By: Vanetta Chou M.D.   On: 03/21/2024 13:15   DG Chest 1 View Result Date: 03/19/2024 EXAM: 1 VIEW XRAY OF THE CHEST 03/19/2024 11:07:00 PM COMPARISON: 03/10/2024 CXR CLINICAL HISTORY: SOB (shortness of breath) 141880. Arrived pov for shob x1 week; ; Per pt I have low iron  and it makes me have shob when it's low. I received some iron  on Friday, but it has worn off and I'm feeling shortness of breath again.; ; Denies CP FINDINGS: LUNGS AND PLEURA: Suspected mild perihilar edema. No focal pulmonary opacity. No pleural effusion. No pneumothorax. HEART AND MEDIASTINUM: Cardiomegaly. No acute abnormality of the cardiac and mediastinal silhouettes. BONES AND SOFT TISSUES: No acute osseous abnormality. Right IJ approach dual lumen central venous catheter in place with tip projecting at the right atrium. Implantable loop recorder noted. IMPRESSION: 1. Cardiomegaly and suspected mild perihilar edema. Electronically signed by: Pinkie Pebbles MD 03/19/2024 11:13 PM EDT RP Workstation: [id number]   DG Chest 2 View Result Date: 03/10/2024 CLINICAL DATA:  [id number].  Leadless pacemaker in place. EXAM: CHEST - 2 VIEW COMPARISON:  Portable chest 03/02/2024 FINDINGS: A leadless pacemaker has been inserted and is located in the anterior aspect of the right ventricle. Right IJ dialysis catheter again terminates in the upper  right atrium. No pneumothorax. The lungs are generally clear with mild hypoinflation. The heart is enlarged with normal caliber central vessel markings. The mediastinum is normally outlined. There is a aortic atherosclerosis. No new osseous finding. IMPRESSION: 1. Leadless pacemaker inserted without evidence of pneumothorax. 2. Cardiomegaly. 3. Aortic atherosclerosis. Electronically Signed   By: Francis Quam M.D.   On: 03/10/2024 07:55   EP PPM/ICD IMPLANT Result Date: 03/09/2024 Table formatting from the original result was not included.  SURGEON:  Eulas Furbish, MD    PREPROCEDURE DIAGNOSES:  1. Irreversible symptomatic bradycardia due to 2:1 heart block    POSTPROCEDURE DIAGNOSES:  1. Irreversible symptomatic bradycardia due to 2:1 heart block    PROCEDURES:   1. Leadless pacemaker placement    INTRODUCTION: The patient is a 57yo woman with ESRD admitted with complete heart block who presents to the EP lab for placement of leadless permanent pacemaker.    DESCRIPTION OF PROCEDURE:  Informed written consent was obtained and the patient was brought to the electrophysiology lab in the fasting state. The patient was placed under general anesthesia by the anesthesiology service.  The patient's bilateral groins were prepped and draped in the usual sterile fashion by the EP lab staff.   Using ultrasound guidance, the right femoral vein was accessed by modified Seldinger technique. An amplatzer superstiff wire was advanced to the SVC. The RFV was dilated serially.  The Medtronic Micra access sheath advanced to the IVC RA junction.  Device placement. The device was advanced to the IVC RA junction. The device was then advanced to the RV and a good distal, septal position obtained. Positioning was confirmed with RAO and LAO views, and a contrast injection performed in RAO to confirm space at the anterior interventricular groove. The device was deployed. Sensing, capture, and impedence were assessed. Good fixation was  confirmed with deflection of 3 tines. The device was flushed,  and the tether clipped and removed.     RV Model Micra AV Serial # [id number] E Amplitude 6.5 mV Threshold 0.8 V @ 0.24 mS Impedence 500 ohms   Hemostasis was obtained with 2 Perclose devices a figure-of-eight suture secured with a stopcock.    CONCLUSIONS:  1. Successful placement of a ventricular leadless pacemakers -- Medtronic Micra AV  3.  No early apparent complications.   Eulas Furbish, MD Cardiac Electrophysiology     ECHOCARDIOGRAM COMPLETE Result Date: 03/03/2024    ECHOCARDIOGRAM REPORT   Patient Name:   LEEYAH HEATHER Date of Exam: 03/03/2024 Medical Rec #:  [id number]    Height:       67.0 in Accession #:    [id number]   Weight:       188.1 lb Date of Birth:  1965-12-03    BSA:          1.970 m Patient Age:    57 years     BP:           139/69 mmHg Patient Gender: F            HR:           49 bpm. Exam Location:  Inpatient Procedure: 2D Echo, Strain Analysis, Cardiac Doppler and Color Doppler (Both            Spectral and Color Flow Doppler were utilized during procedure). 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                 Arrythmias:Bradycardia, Signs/Symptoms:Shortness of Breath; Risk                 Factors:Hypertension, Diabetes and Dyslipidemia.  Sonographer:    Koleen Popper RDCS Referring Phys: [id number] JONATHAN SEGARS  Sonographer Comments: Global longitudinal strain was attempted. IMPRESSIONS  1. Left ventricular ejection fraction, by estimation, is 40 to 45%. The left ventricle has mildly decreased function. The left ventricle demonstrates regional wall motion abnormalities (see scoring diagram/findings for description). The left ventricular  internal cavity size was moderately dilated. Left ventricular diastolic function could not be evaluated. Elevated left atrial pressure. The E/e' is 17.  2. Right ventricular systolic function is low normal. The  right ventricular size is normal. increased. A catheter within the right atrium is visualized. There is severely elevated pulmonary artery systolic pressure.  3. The mitral valve is degenerative. Severe mitral valve regurgitation. Mild mitral valve stenosis (MG , MVA (VTI) 1.07cm2, HR 46bpm, RVSP ). Accuracy limited due to complete heart block. mitral stenosis. Severe mitral annular calcification.  4. Tricuspid valve regurgitation is severe.  5. The aortic valve is normal in structure. Aortic valve regurgitation is not visualized. Aortic valve sclerosis with mild stenosis (peak velocity 2.53m/s, MG , AVA VTI 1.33cm2, DI 0.47). LCC is not very mobile.  6. Pulmonic valve regurgitation is moderate.  7. The inferior vena cava is dilated in size with <50% respiratory variability, suggesting right atrial pressure of 15 mmHg. Comparison(s): No prior Echocardiogram. FINDINGS  Left Ventricle: Left ventricular ejection fraction, by estimation, is 40 to 45%. The left ventricle has mildly decreased function. The left ventricle demonstrates regional wall motion abnormalities. Global longitudinal strain performed but not reported based on interpreter judgement due to suboptimal tracking. The left ventricular internal cavity size was moderately dilated. There is no left ventricular hypertrophy. Left ventricular diastolic function could not be evaluated  due to mitral annular calcification (moderate or greater). Left ventricular diastolic function could not be evaluated. Elevated left atrial pressure. The E/e' is 67.  LV Wall Scoring: The entire septum and mid inferior segment are hypokinetic. Right Ventricle: The right ventricular size is normal. Increased. Right ventricular systolic function is low normal. There is severely elevated pulmonary artery systolic pressure. The tricuspid regurgitant velocity is 3.63 m/s, and with an assumed right atrial pressure of 15 mmHg, the estimated right ventricular systolic  pressure is 67.7 mmHg. Left Atrium: Left atrial size was normal in size. Right Atrium: Right atrial size was normal in size. Pericardium: There is no evidence of pericardial effusion. Mitral Valve: The mitral valve is degenerative in appearance. There is mild thickening of the mitral valve leaflet(s). There is mild calcification of the mitral valve leaflet(s). Mildly decreased mobility of the mitral valve leaflets. Severe mitral annular calcification. Severe mitral valve regurgitation. Mild mitral valve stenosis (MG , MVA (VTI) 1.07cm2, HR 46bpm, RVSP ). Accuracy limited due to complete heart block. mitral valve stenosis. MV peak gradient, 11.7 mmHg. The mean mitral valve gradient is 3.0 mmHg. Tricuspid Valve: The tricuspid valve is grossly normal. Tricuspid valve regurgitation is severe. No evidence of tricuspid stenosis. The flow in the hepatic veins is reversed during ventricular systole. Aortic Valve: The aortic valve is normal in structure. Aortic valve regurgitation is not visualized. Aortic valve sclerosis with mild stenosis (peak velocity 2.56m/s, MG , AVA VTI 1.33cm2, DI 0.47). LCC is not very mobile. Aortic valve mean gradient  measures 10.5 mmHg. Aortic valve peak gradient measures 27.8 mmHg. Aortic valve area, by VTI measures 1.33 cm. Pulmonic Valve: The pulmonic valve was grossly normal. Pulmonic valve regurgitation is moderate. No evidence of pulmonic stenosis. Aorta: The aortic root and ascending aorta are structurally normal, with no evidence of dilitation. Venous: The inferior vena cava is dilated in size with less than 50% respiratory variability, suggesting right atrial pressure of 15 mmHg. IAS/Shunts: The atrial septum is grossly normal. Additional Comments: A catheter within the right atrium is visualized.  LEFT VENTRICLE PLAX 2D LVIDd:         5.80 cm   Diastology LVIDs:         4.40 cm   LV e' medial:    7.83 cm/s LV PW:         1.00 cm   LV E/e' medial:  18.4 LV IVS:         0.90 cm   LV e' lateral:   8.81 cm/s LVOT diam:     1.90 cm   LV E/e' lateral: 16.3 LV SV:         57 LV SV Index:   29 LVOT Area:     2.84 cm  RIGHT VENTRICLE            IVC RV Basal diam:  4.30 cm    IVC diam: 2.20 cm RV Mid diam:    3.90 cm RV S prime:     8.70 cm/s TAPSE (M-mode): 1.8 cm LEFT ATRIUM             Index        RIGHT ATRIUM           Index LA diam:        5.20 cm 2.64 cm/m   RA Area:     17.70 cm LA Vol (A2C):   61.0 ml 30.96 ml/m  RA Volume:   50.20 ml  25.48 ml/m LA Vol (  A4C):   61.5 ml 31.22 ml/m LA Biplane Vol: 61.3 ml 31.12 ml/m  AORTIC VALVE AV Area (Vmax):    1.26 cm AV Area (Vmean):   1.28 cm AV Area (VTI):     1.33 cm AV Vmax:           263.75 cm/s AV Vmean:          148.000 cm/s AV VTI:            0.428 m AV Peak Grad:      27.8 mmHg AV Mean Grad:      10.5 mmHg LVOT Vmax:         117.00 cm/s LVOT Vmean:        66.900 cm/s LVOT VTI:          0.201 m LVOT/AV VTI ratio: 0.47  AORTA Ao Root diam: 2.90 cm Ao Asc diam:  3.20 cm MITRAL VALVE                TRICUSPID VALVE MV Area (PHT): 2.95 cm     TR Peak grad:   52.7 mmHg MV Area VTI:   1.07 cm     TR Vmax:        363.00 cm/s MV Peak grad:  11.7 mmHg MV Mean grad:  3.0 mmHg     SHUNTS MV Vmax:       1.71 m/s     Systemic VTI:  0.20 m MV Vmean:      70.1 cm/s    Systemic Diam: 1.90 cm MV Decel Time: 257 msec MV E velocity: 144.00 cm/s MV A velocity: 82.60 cm/s MV E/A ratio:  1.74 Sunit Tolia Electronically signed by Madonna Large Signature Date/Time: 03/03/2024/1:50:34 PM    Final    DG Chest Port 1 View Result Date: 03/02/2024 CLINICAL DATA:  Chest pain. EXAM: PORTABLE CHEST 1 VIEW COMPARISON:  02/20/2024. FINDINGS: Low lung volumes, similar to the prior exam. Defibrillator pad overlies the left chest wall. Stable cardiomegaly. Right sided CVC catheter tip overlies the superior cavoatrial junction, unchanged. No focal consolidation, pleural effusion, or pneumothorax. No acute osseous abnormality. IMPRESSION: 1. Low lung volumes.  No  acute cardiopulmonary findings. 2. Stable cardiomegaly. Electronically Signed   By: Harrietta Sherry M.D.   On: 03/02/2024 14:03   DG Chest Portable 1 View Result Date: 02/20/2024 CLINICAL DATA:  Shortness of breath EXAM: PORTABLE CHEST 1 VIEW COMPARISON:  Chest x-ray 12/14/2021 FINDINGS: Right-sided central venous catheter tip projects over the caval atrial junction. The heart is enlarged. There is no pleural effusion or pneumothorax. There are minimal patchy airspace opacities in the lateral left lower lung. No acute osseous abnormality. IMPRESSION: 1. Minimal patchy airspace opacities in the lateral left lower lung, which may represent atelectasis or infection. 2. Cardiomegaly. Electronically Signed   By: Greig Pique M.D.   On: 02/20/2024 20:49    Micro Results    No results found for this or any previous visit (from the past 240 hours).  Today   Subjective    Lillie Portner feels much improved since admission.  Feels ready to go home.  Denies chest pain, shortness of breath or abdominal pain.  Feels they can take care of themselves with the resources they have at home.  Objective   Blood pressure 107/70, pulse 73, temperature 97.9 F (36.6 C), resp. rate 18, height 5' 6 (1.676 m), weight 86.6 kg, SpO2 97%.   Intake/Output Summary (Last 24 hours) at 03/21/2024 1503 Last data filed at  03/21/2024 1300 Gross per 24 hour  Intake 120 ml  Output --  Net 120 ml    Exam General: Patient appears well and in good spirits sitting up in bed in no acute distress.  Eyes: sclera anicteric, conjuctiva mild injection bilaterally CVS: S1-S2, regular  Respiratory:  decreased air entry bilaterally secondary to decreased inspiratory effort, rales at bases  GI: NABS, soft, NT  Extremities: Swollen but not erythematous, tender right wrist and right 1st and 2nd MCP Neuro: A/O x 3, Moving all extremities equally with normal strength, CN 3-12 intact, grossly nonfocal.  Psych: patient is logical and  coherent, judgement and insight appear normal, mood and affect appropriate to situation.    Data Review   CBC w Diff:  Lab Results  Component Value Date   WBC 7.3 03/20/2024   HGB 10.6 (L) 03/20/2024   HCT 34.6 (L) 03/20/2024   PLT 209 03/20/2024   LYMPHOPCT 9 03/05/2024   MONOPCT 5 03/05/2024   EOSPCT 1 03/05/2024   BASOPCT 1 03/05/2024    CMP:  Lab Results  Component Value Date   NA 132 (L) 03/21/2024   K 4.1 03/21/2024   CL 92 (L) 03/21/2024   CO2 23 03/21/2024   BUN 32 (H) 03/21/2024   CREATININE 6.32 (H) 03/21/2024   PROT 7.0 02/20/2024   ALBUMIN 3.1 (L) 03/08/2024   BILITOT 1.0 02/20/2024   ALKPHOS 84 02/20/2024   AST 18 02/20/2024   ALT 16 02/20/2024  .   Total Time in preparing paper work, data evaluation and todays exam - 35 minutes  Ivan Vangie Pike M.D on 03/21/2024 at 3:03 PM  [facility]

## 2024-03-21 NOTE — Progress Notes (Signed)
 [facility]  ROUNDING NOTE   Subjective:   [name] is a 58 year old female with past medical conditions including anemia, GERD, hypertension, stroke, diabetes, and end-stage renal disease on hemodialysis.  Patient presents to the emergency department with shortness of breath and has been admitted under observation for Acute pulmonary edema (HCC) [J81.0] SOB (shortness of breath) [R06.02] Fluid overload [E87.70]  Patient is known to our practice and receives outpatient dialysis treatments at [facility] MWF schedule, supervised by Dr. Marcelino.   Update Patient seen sitting up in chair Alert and oriented Husband at bedside States she did not sleep well Complains of pain and swelling in right hand, splint in place Experienced severe cramping with dialysis yesterday   Objective:  Vital signs in last 24 hours:  Temp:  [97.4 F (36.3 C)-98.4 F (36.9 C)] 97.9 F (36.6 C) (07/22 1249) Pulse Rate:  [73-79] 73 (07/22 1249) Resp:  [17-20] 18 (07/22 0437) BP: (105-121)/(70-85) 107/70 (07/22 1249) SpO2:  [94 %-100 %] 97 % (07/22 1249) Weight:  [86.6 kg] 86.6 kg (07/22 0500)  Weight change: -0.7 kg Filed Weights   03/20/24 0854 03/20/24 1304 03/21/24 0500  Weight: 86.6 kg 84.9 kg 86.6 kg    Intake/Output: I/O last 3 completed shifts: In: 0  Out: 1500 [Other:1500]   Intake/Output this shift:  No intake/output data recorded.  Physical Exam: General: NAD  Head: Normocephalic, atraumatic. Moist oral mucosal membranes  Eyes: Anicteric  Neck: Supple  Lungs:  Clear to auscultation  Heart: Regular rate and rhythm  Abdomen:  Soft, nontender  Extremities: Trace peripheral edema.  Neurologic: Awake, alert, conversant  Skin: Warm,dry, no rash  Access: Right IJ PermCath    Basic Metabolic Panel: Recent Labs  Lab 03/19/24 2233 03/20/24 0603 03/21/24 0521  NA 137 136 132*  K 5.1 4.4 4.1  CL 93* 91* 92*  CO2 24 25 23   GLUCOSE 251* 216* 188*  BUN 49*  52* 32*  CREATININE 8.40* 8.94* 6.32*  CALCIUM  9.2 9.2 9.4    Liver Function Tests: No results for input(s): AST, ALT, ALKPHOS, BILITOT, PROT, ALBUMIN in the last 168 hours. No results for input(s): LIPASE, AMYLASE in the last 168 hours. No results for input(s): AMMONIA in the last 168 hours.  CBC: Recent Labs  Lab 03/19/24 2233 03/20/24 0603  WBC 7.0 7.3  HGB 10.9* 10.6*  HCT 35.8* 34.6*  MCV 103.5* 104.8*  PLT 203 209    Cardiac Enzymes: No results for input(s): CKTOTAL, CKMB, CKMBINDEX, TROPONINI in the last 168 hours.  BNP: Invalid input(s): POCBNP  CBG: Recent Labs  Lab 03/20/24 1413 03/20/24 1550 03/20/24 2048 03/21/24 0853 03/21/24 1254  GLUCAP 213* 263* 200* 276* 234*    Microbiology: Results for orders placed or performed during the hospital encounter of 12/14/21  Surgical pcr screen     Status: None   Collection Time: 12/14/21 12:17 AM   Specimen: Nasal Mucosa; Nasal Swab  Result Value Ref Range Status   MRSA, PCR NEGATIVE NEGATIVE Final   Staphylococcus aureus NEGATIVE NEGATIVE Final    Comment: (NOTE) The Xpert SA Assay (FDA approved for NASAL specimens in patients 21 years of age and older), is one component of a comprehensive surveillance program. It is not intended to diagnose infection nor to guide or monitor treatment. Performed at [facility], [address] [place], [place] [address]     Coagulation Studies: No results for input(s): LABPROT, INR in the last 72 hours.  Urinalysis: No results for  input(s): COLORURINE, LABSPEC, PHURINE, GLUCOSEU, HGBUR, BILIRUBINUR, KETONESUR, PROTEINUR, UROBILINOGEN, NITRITE, LEUKOCYTESUR in the last 72 hours.  Invalid input(s): APPERANCEUR    Imaging: DG Wrist 2 Views Right Result Date: 03/21/2024 CLINICAL DATA:  Right upper extremity pain. EXAM: RIGHT WRIST - 2 VIEW; RIGHT HAND - 2 VIEW COMPARISON:  None Available. FINDINGS: No  acute fracture or dislocation. The bones are osteopenic. The soft tissues are unremarkable. Vascular calcifications noted. IMPRESSION: 1. No acute fracture or dislocation. 2. Osteopenia. Electronically Signed   By: Vanetta Chou M.D.   On: 03/21/2024 13:15   DG Hand 2 View Right Result Date: 03/21/2024 CLINICAL DATA:  Right upper extremity pain. EXAM: RIGHT WRIST - 2 VIEW; RIGHT HAND - 2 VIEW COMPARISON:  None Available. FINDINGS: No acute fracture or dislocation. The bones are osteopenic. The soft tissues are unremarkable. Vascular calcifications noted. IMPRESSION: 1. No acute fracture or dislocation. 2. Osteopenia. Electronically Signed   By: Vanetta Chou M.D.   On: 03/21/2024 13:15   DG Chest 1 View Result Date: 03/19/2024 EXAM: 1 VIEW XRAY OF THE CHEST 03/19/2024 11:07:00 PM COMPARISON: 03/10/2024 CXR CLINICAL HISTORY: SOB (shortness of breath) 141880. Arrived pov for shob x1 week; ; Per pt I have low iron  and it makes me have shob when it's low. I received some iron  on Friday, but it has worn off and I'm feeling shortness of breath again.; ; Denies CP FINDINGS: LUNGS AND PLEURA: Suspected mild perihilar edema. No focal pulmonary opacity. No pleural effusion. No pneumothorax. HEART AND MEDIASTINUM: Cardiomegaly. No acute abnormality of the cardiac and mediastinal silhouettes. BONES AND SOFT TISSUES: No acute osseous abnormality. Right IJ approach dual lumen central venous catheter in place with tip projecting at the right atrium. Implantable loop recorder noted. IMPRESSION: 1. Cardiomegaly and suspected mild perihilar edema. Electronically signed by: Pinkie Pebbles MD 03/19/2024 11:13 PM EDT RP Workstation: [id number]     Medications:     Chlorhexidine  Gluconate Cloth  6 each Topical Daily   heparin   5,000 Units Subcutaneous Q8H   insulin  aspart  0-5 Units Subcutaneous QHS   insulin  aspart  0-9 Units Subcutaneous TID WC   sevelamer  carbonate  2,400 mg Oral TID WC   sodium chloride   flush  10-40 mL Intracatheter Q12H   [START ON 03/26/2024] Vitamin D  (Ergocalciferol )  50,000 Units Oral Q7 days   acetaminophen  **OR** acetaminophen , albuterol , dextromethorphan , heparin  sodium (porcine), magnesium  hydroxide, ondansetron  **OR** ondansetron  (ZOFRAN ) IV, sodium chloride  flush, traZODone   Assessment/ Plan:  [name] is a 57 y.o.  female with past medical conditions including anemia, GERD, hypertension, stroke, diabetes, and end-stage renal disease on hemodialysis.  Patient presents to the emergency department with shortness of breath and has been admitted under observation for Acute pulmonary edema (HCC) [J81.0] SOB (shortness of breath) [R06.02] Fluid overload [E87.70]  [facility]/MWF/Rt internal jugular permcath  Acute respiratory failure, .Chest x-ray shows cardiomegaly with suspected mild perihilar edema. . Has been weaned to room air.  2.  End-stage renal disease on hemodialysis.  Patient completed dialysis yesterday, attempted a UF of 2.8 however experienced severe cramping.  UF achieved of 1.5 L.  Next treatment scheduled for Wednesday.  3. Anemia of chronic kidney disease Lab Results  Component Value Date   HGB 10.6 (L) 03/20/2024    Hemoglobin 10.6, at last check.  No need of ESA's at this time.    4. Secondary Hyperparathyroidism: with outpatient labs: PTH 1276, phosphorus 6.4, calcium  8.1 on 03/13/24.   Lab Results  Component Value Date   CALCIUM  9.4 03/21/2024   CAION 1.00 (L) 03/09/2024   PHOS 6.3 (H) 03/08/2024    Currently prescribed sevelamer  with meals and weekly ergocalciferol    LOS: 1 Conner Muegge 7/22/20251:41 PM

## 2024-03-21 NOTE — Evaluation (Signed)
 Physical Therapy Evaluation Patient Details Name: [name] MRN: [id number] DOB: [birth year] Today's Date: 03/21/2024  History of Present Illness  Pt is a 58 y.o. female who presented to the ER with acute onset of worsening dyspnea over the last 4 weeks, as well as worsening lower extremity edema. Admitted for management of fluid overload PMH of ESRD on HD on MWF, GERD, DM2, hypertension, complete heart block s/p pacemaker and peripheral neuropathy, as well as CVA.  Clinical Impression  Patient admitted with the above. PTA, patient lives with husband and ambulates with B axillary crutches as well as occasional use of w/c. She is typically independent with mobility and ADLs. Patient able to complete w/c<>chair t/f modI. Complaining of severe R hand/wrist pain, imaging pending. Wanting to attempt ambulation with crutches once results are obtained. Patient will benefit from skilled PT services during acute stay to address listed deficits. Patient will benefit from ongoing therapy at discharge to maximize functional independence and safety.         If plan is discharge home, recommend the following: A little help with walking and/or transfers;Assistance with cooking/housework;Assist for transportation;Help with stairs or ramp for entrance   Can travel by private vehicle        Equipment Recommendations None recommended by PT  Recommendations for Other Services       Functional Status Assessment Patient has had a recent decline in their functional status and demonstrates the ability to make significant improvements in function in a reasonable and predictable amount of time.     Precautions / Restrictions Precautions Precautions: Fall Recall of Precautions/Restrictions: Intact Restrictions Weight Bearing Restrictions Per Provider Order: No      Mobility  Bed Mobility               General bed mobility comments: seated in personal w/c on arrival    Transfers Overall transfer  level: Modified independent Equipment used: None               General transfer comment: able to transfer from w/c<>chair modI. Dyspnea noted after initial transfer but quickly subsides and spO2 >90%    Ambulation/Gait               General Gait Details: wanting to practice ambulation once imaging results come back for R wrist/hand  Stairs            Wheelchair Mobility     Tilt Bed    Modified Rankin (Stroke Patients Only)       Balance Overall balance assessment: Mild deficits observed, not formally tested                                           Pertinent Vitals/Pain Pain Assessment Pain Assessment: Faces Faces Pain Scale: Hurts whole lot Pain Location: R hand/wrist Pain Descriptors / Indicators: Discomfort, Grimacing, Tender Pain Intervention(s): Limited activity within patient's tolerance, Monitored during session, Repositioned    Home Living Family/patient expects to be discharged to:: Private residence Living Arrangements: Spouse/significant other Available Help at Discharge: Family;Available PRN/intermittently;Friend(s) Type of Home: House Home Access: Ramped entrance;Stairs to enter Entrance Stairs-Rails: Right;Left;Can reach both Entrance Stairs-Number of Steps: 5   Home Layout: One level Home Equipment: Crutches;Wheelchair - manual;BSC/3in1;Rollator (4 wheels);Rolling [name] (2 wheels);Electric scooter      Prior Function Prior Level of Function : Driving;Independent/Modified Independent  Mobility Comments: MOD I uses crutches to walk indoors/outdoors to car, in dialysis, drives herself to HD; reports use of motorized scooter for long distances; denies falls ADLs Comments: IND/MOD I with ADL/IADL management     Extremity/Trunk Assessment   Upper Extremity Assessment Upper Extremity Assessment: Defer to OT evaluation    Lower Extremity Assessment Lower Extremity Assessment: Generalized  weakness       Communication   Communication Communication: No apparent difficulties    Cognition Arousal: Alert Behavior During Therapy: WFL for tasks assessed/performed   PT - Cognitive impairments: No apparent impairments                         Following commands: Intact       Cueing       General Comments      Exercises     Assessment/Plan    PT Assessment Patient needs continued PT services  PT Problem List Decreased strength;Decreased activity tolerance;Decreased balance;Decreased mobility;Decreased knowledge of precautions;Decreased knowledge of use of DME;Decreased safety awareness;Cardiopulmonary status limiting activity       PT Treatment Interventions DME instruction;Gait training;Functional mobility training;Therapeutic activities;Therapeutic exercise;Balance training;Neuromuscular re-education;Patient/family education    PT Goals (Current goals can be found in the Care Plan section)  Acute Rehab PT Goals Patient Stated Goal: to go home PT Goal Formulation: With patient/family Time For Goal Achievement: 04/04/24 Potential to Achieve Goals: Good    Frequency Min 2X/week     Co-evaluation               AM-PAC PT 6 Clicks Mobility  Outcome Measure Help needed turning from your back to your side while in a flat bed without using bedrails?: None Help needed moving from lying on your back to sitting on the side of a flat bed without using bedrails?: None Help needed moving to and from a bed to a chair (including a wheelchair)?: None Help needed standing up from a chair using your arms (e.g., wheelchair or bedside chair)?: None Help needed to walk in hospital room?: A Little Help needed climbing 3-5 steps with a railing? : A Little 6 Click Score: 22    End of Session   Activity Tolerance: Patient tolerated treatment well Patient left: Other (comment) (in personal w/c with husband present) Nurse Communication: Mobility status PT  Visit Diagnosis: Muscle weakness (generalized) (M62.81);Other abnormalities of gait and mobility (R26.89)    Time: 8985-8964 PT Time Calculation (min) (ACUTE ONLY): 21 min   Charges:   PT Evaluation $PT Eval Low Complexity: 1 Low   PT General Charges $$ ACUTE PT VISIT: 1 Visit         Maryanne Finder, PT, DPT Physical Therapist - [facility]  [facility]   Emric Kowalewski A Alejandro Gamel 03/21/2024, 1:01 PM

## 2024-03-21 NOTE — Plan of Care (Signed)

## 2024-03-22 ENCOUNTER — Encounter: Payer: Self-pay | Admitting: Emergency Medicine

## 2024-03-22 ENCOUNTER — Other Ambulatory Visit: Payer: Self-pay

## 2024-03-22 ENCOUNTER — Emergency Department

## 2024-03-22 ENCOUNTER — Emergency Department
Admission: EM | Admit: 2024-03-22 | Discharge: 2024-03-24 | Disposition: A | Discharge status: Home / Self Care | Attending: Emergency Medicine | Admitting: Emergency Medicine

## 2024-03-22 ENCOUNTER — Observation Stay

## 2024-03-22 DIAGNOSIS — I509 Heart failure, unspecified: Secondary | ICD-10-CM

## 2024-03-22 DIAGNOSIS — Z8673 Personal history of transient ischemic attack (TIA), and cerebral infarction without residual deficits: Secondary | ICD-10-CM | POA: Diagnosis not present

## 2024-03-22 DIAGNOSIS — E1122 Type 2 diabetes mellitus with diabetic chronic kidney disease: Secondary | ICD-10-CM | POA: Insufficient documentation

## 2024-03-22 DIAGNOSIS — R262 Difficulty in walking, not elsewhere classified: Secondary | ICD-10-CM | POA: Insufficient documentation

## 2024-03-22 DIAGNOSIS — M130 Polyarthritis, unspecified: Secondary | ICD-10-CM

## 2024-03-22 DIAGNOSIS — Z794 Long term (current) use of insulin: Secondary | ICD-10-CM | POA: Diagnosis not present

## 2024-03-22 DIAGNOSIS — I132 Hypertensive heart and chronic kidney disease with heart failure and with stage 5 chronic kidney disease, or end stage renal disease: Secondary | ICD-10-CM | POA: Diagnosis not present

## 2024-03-22 DIAGNOSIS — Z992 Dependence on renal dialysis: Secondary | ICD-10-CM | POA: Diagnosis not present

## 2024-03-22 DIAGNOSIS — I5022 Chronic systolic (congestive) heart failure: Secondary | ICD-10-CM | POA: Diagnosis not present

## 2024-03-22 DIAGNOSIS — N186 End stage renal disease: Secondary | ICD-10-CM | POA: Insufficient documentation

## 2024-03-22 DIAGNOSIS — Z950 Presence of cardiac pacemaker: Secondary | ICD-10-CM | POA: Diagnosis not present

## 2024-03-22 DIAGNOSIS — M2540 Effusion, unspecified joint: Secondary | ICD-10-CM

## 2024-03-22 LAB — CBC WITH DIFFERENTIAL/PLATELET
Abs Immature Granulocytes: 0.04 K/uL (ref 0.00–0.07)
Basophils Absolute: 0.1 K/uL (ref 0.0–0.1)
Basophils Relative: 1 %
Eosinophils Absolute: 0.1 K/uL (ref 0.0–0.5)
Eosinophils Relative: 1 %
HCT: 37.6 % (ref 36.0–46.0)
Hemoglobin: 11.6 g/dL — ABNORMAL LOW (ref 12.0–15.0)
Immature Granulocytes: 0 %
Lymphocytes Relative: 7 %
Lymphs Abs: 0.7 K/uL (ref 0.7–4.0)
MCH: 31.4 pg (ref 26.0–34.0)
MCHC: 30.9 g/dL (ref 30.0–36.0)
MCV: 101.9 fL — ABNORMAL HIGH (ref 80.0–100.0)
Monocytes Absolute: 0.5 K/uL (ref 0.1–1.0)
Monocytes Relative: 5 %
Neutro Abs: 8.4 K/uL — ABNORMAL HIGH (ref 1.7–7.7)
Neutrophils Relative %: 86 %
Platelets: 159 K/uL (ref 150–400)
RBC: 3.69 MIL/uL — ABNORMAL LOW (ref 3.87–5.11)
RDW: 16 % — ABNORMAL HIGH (ref 11.5–15.5)
WBC: 9.8 K/uL (ref 4.0–10.5)
nRBC: 0 % (ref 0.0–0.2)

## 2024-03-22 LAB — BASIC METABOLIC PANEL WITH GFR
Anion gap: 19 — ABNORMAL HIGH (ref 5–15)
BUN: 48 mg/dL — ABNORMAL HIGH (ref 6–20)
CO2: 22 mmol/L (ref 22–32)
Calcium: 9.5 mg/dL (ref 8.9–10.3)
Chloride: 92 mmol/L — ABNORMAL LOW (ref 98–111)
Creatinine, Ser: 8.17 mg/dL — ABNORMAL HIGH (ref 0.44–1.00)
GFR, Estimated: 5 mL/min — ABNORMAL LOW (ref >60)
Glucose, Bld: 199 mg/dL — ABNORMAL HIGH (ref 70–99)
Potassium: 4.6 mmol/L (ref 3.5–5.1)
Sodium: 133 mmol/L — ABNORMAL LOW (ref 135–145)

## 2024-03-22 LAB — CBG MONITORING, ED
Glucose-Capillary: 189 mg/dL — ABNORMAL HIGH (ref 70–99)
Glucose-Capillary: 274 mg/dL — ABNORMAL HIGH (ref 70–99)

## 2024-03-22 LAB — C-REACTIVE PROTEIN: CRP: 6.5 mg/dL — ABNORMAL HIGH (ref <1.0)

## 2024-03-22 LAB — SEDIMENTATION RATE: Sed Rate: 6 mm/h (ref 0–30)

## 2024-03-22 LAB — URIC ACID: Uric Acid, Serum: 7.4 mg/dL — ABNORMAL HIGH (ref 2.5–7.1)

## 2024-03-22 MED ORDER — HEPARIN SODIUM (PORCINE) 1000 UNIT/ML IJ SOLN
INTRAMUSCULAR | Status: AC
Start: 2024-03-22 — End: 2024-03-22
  Filled 2024-03-22: qty 10

## 2024-03-22 MED ORDER — ACETAMINOPHEN 650 MG RE SUPP: 650.0000 mg | Freq: Four times a day (QID) | RECTAL | Status: DC | PRN

## 2024-03-22 MED ORDER — ALBUTEROL SULFATE (2.5 MG/3ML) 0.083% IN NEBU: 2.5000 mg | INHALATION_SOLUTION | RESPIRATORY_TRACT | Status: DC | PRN

## 2024-03-22 MED ORDER — INSULIN ASPART 100 UNIT/ML IJ SOLN
0.0000 [IU] | Freq: Three times a day (TID) | INTRAMUSCULAR | Status: DC
  Administered 2024-03-22: 2 [IU] via SUBCUTANEOUS
  Administered 2024-03-23: 7 [IU] via SUBCUTANEOUS
  Administered 2024-03-23: 9 [IU] via SUBCUTANEOUS
  Administered 2024-03-23: 7 [IU] via SUBCUTANEOUS
  Filled 2024-03-22 (×4): qty 1

## 2024-03-22 MED ORDER — OXYCODONE-ACETAMINOPHEN 5-325 MG PO TABS
1.0000 | ORAL_TABLET | Freq: Once | ORAL | Status: AC
  Administered 2024-03-22: 1 via ORAL
  Filled 2024-03-22: qty 1

## 2024-03-22 MED ORDER — INSULIN ASPART 100 UNIT/ML IJ SOLN: 0.0000 [IU] | Freq: Three times a day (TID) | INTRAMUSCULAR | Status: DC

## 2024-03-22 MED ORDER — ONDANSETRON HCL 4 MG/2ML IJ SOLN
4.0000 mg | Freq: Three times a day (TID) | INTRAMUSCULAR | Status: AC | PRN
Start: 2024-03-22 — End: 2024-03-22

## 2024-03-22 MED ORDER — ACETAMINOPHEN 325 MG PO TABS
650.0000 mg | ORAL_TABLET | Freq: Four times a day (QID) | ORAL | Status: DC | PRN
  Administered 2024-03-22 – 2024-03-23 (×2): 650 mg via ORAL
  Filled 2024-03-22 (×2): qty 2

## 2024-03-22 MED ORDER — HYDROMORPHONE HCL 1 MG/ML IJ SOLN: 0.5000 mg | INTRAMUSCULAR | Status: AC | PRN

## 2024-03-22 MED ORDER — HEPARIN SODIUM (PORCINE) 5000 UNIT/ML IJ SOLN
5000.0000 [IU] | Freq: Two times a day (BID) | INTRAMUSCULAR | Status: DC
  Filled 2024-03-22 (×2): qty 1

## 2024-03-22 MED ORDER — OXYCODONE HCL 5 MG PO TABS
5.0000 mg | ORAL_TABLET | Freq: Four times a day (QID) | ORAL | Status: DC | PRN
  Administered 2024-03-22 – 2024-03-23 (×3): 5 mg via ORAL
  Filled 2024-03-22 (×4): qty 1

## 2024-03-22 MED ORDER — METHYLPREDNISOLONE SODIUM SUCC 125 MG IJ SOLR
60.0000 mg | Freq: Once | INTRAMUSCULAR | Status: AC
  Administered 2024-03-22: 60 mg via INTRAVENOUS
  Filled 2024-03-22: qty 2

## 2024-03-22 MED ORDER — INSULIN ASPART 100 UNIT/ML IJ SOLN
0.0000 [IU] | Freq: Every day | INTRAMUSCULAR | Status: DC
  Administered 2024-03-22: 3 [IU] via SUBCUTANEOUS
  Administered 2024-03-23: 5 [IU] via SUBCUTANEOUS
  Filled 2024-03-22 (×2): qty 1

## 2024-03-22 MED ORDER — ALBUTEROL SULFATE (2.5 MG/3ML) 0.083% IN NEBU: 3.0000 mL | INHALATION_SOLUTION | Freq: Four times a day (QID) | RESPIRATORY_TRACT | Status: DC | PRN

## 2024-03-22 MED ORDER — SEVELAMER CARBONATE 800 MG PO TABS
2400.0000 mg | ORAL_TABLET | Freq: Three times a day (TID) | ORAL | Status: DC
  Administered 2024-03-22 – 2024-03-23 (×3): 2400 mg via ORAL
  Filled 2024-03-22 (×10): qty 3

## 2024-03-22 MED ORDER — COLCHICINE 0.6 MG PO TABS
0.6000 mg | ORAL_TABLET | Freq: Once | ORAL | Status: AC
  Administered 2024-03-22: 0.6 mg via ORAL
  Filled 2024-03-22: qty 1

## 2024-03-22 MED ORDER — CHLORHEXIDINE GLUCONATE CLOTH 2 % EX PADS
6.0000 | MEDICATED_PAD | Freq: Every day | CUTANEOUS | Status: DC
  Filled 2024-03-22 (×3): qty 6

## 2024-03-22 NOTE — ED Provider Notes (Signed)
 [facility] Provider Note    Event Date/Time   First MD Initiated Contact with Patient 03/22/24 0330     (approximate)   History   Joint Swelling   HPI  [name] is a 58 y.o. female past medical history significant for ESRD on HD MWF, GERD, diabetes, hypertension, peripheral neuropathy, prior CVA, presents to the emergency department with joint swelling.  Patient had a recent hospitalization and left yesterday prior to completing workup.  Patient was admitted for respiratory distress and pulmonary edema and had an extra session of dialysis.  Following day he woke up and had significant swelling to her metacarpal joint to bilateral hands, her right wrist.  It was recommended that she stay to get worked up with orthopedics and that she they did not feel that she had gout.  Patient stated that she did not want to stay at that time.  Returns for worsening pain and now states that she is also having involvement to her left ankle.  She denies fever or chills.  No similar episodes in the past.     Physical Exam   Triage Vital Signs: ED Triage Vitals  Encounter Vitals Group     BP 03/22/24 0325 116/82     Girls Systolic BP Percentile --      Girls Diastolic BP Percentile --      Boys Systolic BP Percentile --      Boys Diastolic BP Percentile --      Pulse Rate 03/22/24 0325 77     Resp 03/22/24 0325 18     Temp 03/22/24 0325 98.1 F (36.7 C)     Temp src --      SpO2 03/22/24 0325 99 %     Weight 03/22/24 0326 186 lb 4.6 oz (84.5 kg)     Height 03/22/24 0326 5' 6 (1.676 m)     Head Circumference --      Peak Flow --      Pain Score 03/22/24 0326 8     Pain Loc --      Pain Education --      Exclude from Growth Chart --     Most recent vital signs: Vitals:   03/22/24 0625 03/22/24 0700  BP:  123/70  Pulse: 71 73  Resp:  17  Temp:    SpO2: (!) 89% 100%    Physical Exam Constitutional:      Appearance: She is well-developed.  HENT:      Head: Atraumatic.  Eyes:     Conjunctiva/sclera: Conjunctivae normal.  Cardiovascular:     Rate and Rhythm: Regular rhythm.  Pulmonary:     Effort: No respiratory distress.  Abdominal:     General: There is no distension.     Tenderness: There is no abdominal tenderness.  Musculoskeletal:     Cervical back: Normal range of motion.  Skin:    General: Skin is warm.  Neurological:     Mental Status: She is alert. Mental status is at baseline.              IMPRESSION / MDM / ASSESSMENT AND PLAN / ED COURSE  I reviewed the triage vital signs and the nursing notes. Differential diagnosis including gout, pseudogout, rheumatologic arthritis  RADIOLOGY I independently reviewed imaging, my interpretation of imaging: X-ray of the ankle with no significant joint effusion no fracture  LABS (all labs ordered are listed, but only abnormal results are displayed) Labs interpreted as -  Labs Reviewed  CBC WITH DIFFERENTIAL/PLATELET - Abnormal; Notable for the following components:      Result Value   RBC 3.69 (*)    Hemoglobin 11.6 (*)    MCV 101.9 (*)    RDW 16.0 (*)    Neutro Abs 8.4 (*)    All other components within normal limits  BASIC METABOLIC PANEL WITH GFR - Abnormal; Notable for the following components:   Sodium 133 (*)    Chloride 92 (*)    Glucose, Bld 199 (*)    BUN 48 (*)    Creatinine, Ser 8.17 (*)    GFR, Estimated 5 (*)    Anion gap 19 (*)    All other components within normal limits  URIC ACID - Abnormal; Notable for the following components:   Uric Acid, Serum 7.4 (*)    All other components within normal limits  SEDIMENTATION RATE  C-REACTIVE PROTEIN     MDM    Lab work and creatinine is at her baseline.  Normal ESR.  CRP is a send out lab.  No significant leukocytosis.  I have a low suspicion for septic joint.  I do not see a joint that I would be able to aspirate.  Patient was given oral pain medication.  On chart review recommended  evaluation by orthopedics and rheumatologic workup as an inpatient.  My evaluation biggest concern would be for gout.  Have low suspicion for septic joint.  Admitted for polyarthritis -consulted hospitalist for admission.   PROCEDURES:  Critical Care performed: No  Procedures  Patient's presentation is most consistent with acute complicated illness / injury requiring diagnostic workup.   MEDICATIONS ORDERED IN ED: Medications  albuterol  (PROVENTIL ) (2.5 MG/3ML) 0.083% nebulizer solution 2.5 mg (has no administration in time range)  HYDROmorphone  (DILAUDID ) injection 0.5 mg (has no administration in time range)  ondansetron  (ZOFRAN ) injection 4 mg (has no administration in time range)  oxyCODONE -acetaminophen  (PERCOCET/ROXICET) 5-325 MG per tablet 1 tablet (1 tablet Oral Given 03/22/24 0508)    FINAL CLINICAL IMPRESSION(S) / ED DIAGNOSES   Final diagnoses:  Joint swelling     Rx / DC Orders   ED Discharge Orders     None        Note:  This document was prepared using Dragon voice recognition software and may include unintentional dictation errors.   Suzanne Kirsch, MD 03/22/24 [phone]

## 2024-03-22 NOTE — Progress Notes (Signed)
   03/22/24 1702  Vitals  BP 136/68  Pulse Rate 78  Resp 17  Weight 84.4 kg  Type of Weight Post-Dialysis  Oxygen Therapy  SpO2 100 %  O2 Device Nasal Cannula  Post Treatment  Dialyzer Clearance Lightly streaked  Liters Processed 76.3  Fluid Removed (mL) 500 mL  Tolerated HD Treatment Yes  Post-Hemodialysis Comments Goal has been met. Patient complained of cramping to right leg. Cramping subsided with rinseback

## 2024-03-22 NOTE — Progress Notes (Signed)
 [facility]  ROUNDING NOTE   Subjective:   [name] is a 58 year old female with past medical conditions including anemia, GERD, hypertension, stroke, diabetes, and end-stage renal disease on hemodialysis.  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 Marcelino.  Patient was just discharged yesterday from recent admission for shortness of breath.  She states when she arrived home, she was experiencing swelling in her right hand.  States swelling in right hand became much worse along with left hand.  She was unable to bend her fingers or grip objects.  States when she began to experience swelling in her left ankle, she came to be evaluated.  Denies gout or arthritis.  States she may have experienced a bug bite on right hand, unsure.  Labs on ED arrival shows sodium 133, glucose 199, BUN 44, creatinine 8.17 with GFR 5, and hemoglobin 11.6.  CRP 6.5.  Sed rate 6.  Uric acid 7.4.  Left ankle imaging shows soft tissue swelling.  We have been consulted to manage dialysis needs.  Objective:  Vital signs in last 24 hours:  Temp:  [97.5 F (36.4 C)-98.2 F (36.8 C)] 97.5 F (36.4 C) (07/23 1256) Pulse Rate:  [69-81] 74 (07/23 1400) Resp:  [16-25] 17 (07/23 1400) BP: (106-135)/(65-89) 106/89 (07/23 1400) SpO2:  [89 %-100 %] 100 % (07/23 1400) Weight:  [84.5 kg] 84.5 kg (07/23 1256)  Weight change:  Filed Weights   03/22/24 0326 03/22/24 1256  Weight: 84.5 kg 84.5 kg    Intake/Output: No intake/output data recorded.   Intake/Output this shift:  No intake/output data recorded.  Physical Exam: General: NAD  Head: Normocephalic, atraumatic. Moist oral mucosal membranes  Eyes: Anicteric   Neck: Supple  Lungs:  Clear to auscultation  Heart: Regular rate and rhythm  Abdomen:  Soft, nontender  Extremities: Trace peripheral edema.  Neurologic: Awake, alert, conversant  Skin: Warm,dry, no rash  Access: Right IJ PermCath    Basic Metabolic Panel: Recent Labs  Lab 03/19/24 2233 03/20/24 0603 03/21/24 0521 03/22/24 0503  NA 137 136 132* 133*  K 5.1 4.4 4.1 4.6  CL 93* 91* 92* 92*  CO2 24 25 23 22   GLUCOSE 251* 216* 188* 199*  BUN 49* 52* 32* 48*  CREATININE 8.40* 8.94* 6.32* 8.17*  CALCIUM  9.2 9.2 9.4 9.5    Liver Function Tests: No results for input(s): AST, ALT, ALKPHOS, BILITOT, PROT, ALBUMIN in the last 168 hours. No results for input(s): LIPASE, AMYLASE in the last 168 hours. No results for input(s): AMMONIA in the last 168 hours.  CBC: Recent Labs  Lab 03/19/24 2233 03/20/24 0603 03/22/24 0503  WBC 7.0 7.3 9.8  NEUTROABS  --   --  8.4*  HGB 10.9* 10.6* 11.6*  HCT 35.8* 34.6* 37.6  MCV 103.5* 104.8* 101.9*  PLT 203 209 159    Cardiac Enzymes: No results for input(s): CKTOTAL, CKMB, CKMBINDEX, TROPONINI in the last 168 hours.  BNP: Invalid input(s): POCBNP  CBG: Recent Labs  Lab 03/20/24 1413 03/20/24 1550 03/20/24 2048 03/21/24 0853 03/21/24 1254  GLUCAP 213* 263* 200* 276* 234*    Microbiology: Results for orders placed or performed during the hospital encounter of 12/14/21  Surgical pcr screen     Status: None   Collection  Time: 12/14/21 12:17 AM   Specimen: Nasal Mucosa; Nasal Swab  Result Value Ref Range Status   MRSA, PCR NEGATIVE NEGATIVE Final   Staphylococcus aureus NEGATIVE NEGATIVE Final    Comment: (NOTE) The Xpert SA Assay (FDA approved for NASAL specimens in patients 84 years of age and older), is one component of a comprehensive surveillance program. It is not intended to diagnose infection nor to guide or monitor treatment. Performed at [facility], [address]  [place], [place] [address]     Coagulation Studies: No results for input(s): LABPROT, INR in the last 72 hours.  Urinalysis: No results for input(s): COLORURINE, LABSPEC, PHURINE, GLUCOSEU, HGBUR, BILIRUBINUR, KETONESUR, PROTEINUR, UROBILINOGEN, NITRITE, LEUKOCYTESUR in the last 72 hours.  Invalid input(s): APPERANCEUR    Imaging: DG Chest 1 View Result Date: 03/22/2024 CLINICAL DATA:  CHF. EXAM: CHEST  1 VIEW COMPARISON:  03/19/2024. FINDINGS: Low lung volumes. Stable cardiomegaly. Stable right IJ CVC catheter tip overlies the right atrium. Slightly decreased bilateral perihilar interstitial prominence. No focal consolidation, sizeable pleural effusion, or pneumothorax. The visualized osseous structures are unchanged. IMPRESSION: Low lung volumes. Stable cardiomegaly with slightly decreased bilateral perihilar interstitial prominence, favored to reflect perihilar edema. Electronically Signed   By: Harrietta Sherry M.D.   On: 03/22/2024 09:23   DG Ankle Complete Left Result Date: 03/22/2024 EXAM: 3 or more VIEW(S) XRAY OF THE LEFT ANKLE 03/22/2024 05:13:09 AM CLINICAL HISTORY: Left ankle pain and swelling with no injury. COMPARISON: None available. FINDINGS: BONES AND JOINTS: No acute fracture. No focal osseous lesion. No joint dislocation. Spurring is present at the achilles tendon insertion. SOFT TISSUES: Diffuse soft tissue swelling is present about the ankle. Microvascular calcifications are present. IMPRESSION: 1. Diffuse soft tissue swelling about the ankle. 2. No acute osseous abnormality. Electronically signed by: Lonni Necessary MD 03/22/2024 05:45 AM EDT RP Workstation: [id number]   DG Wrist 2 Views Right Result Date: 03/21/2024 CLINICAL DATA:  Right upper extremity pain. EXAM: RIGHT WRIST - 2 VIEW; RIGHT HAND - 2 VIEW COMPARISON:  None Available. FINDINGS: No acute fracture or dislocation. The bones are osteopenic. The soft tissues are unremarkable. Vascular  calcifications noted. IMPRESSION: 1. No acute fracture or dislocation. 2. Osteopenia. Electronically Signed   By: Vanetta Chou M.D.   On: 03/21/2024 13:15   DG Hand 2 View Right Result Date: 03/21/2024 CLINICAL DATA:  Right upper extremity pain. EXAM: RIGHT WRIST - 2 VIEW; RIGHT HAND - 2 VIEW COMPARISON:  None Available. FINDINGS: No acute fracture or dislocation. The bones are osteopenic. The soft tissues are unremarkable. Vascular calcifications noted. IMPRESSION: 1. No acute fracture or dislocation. 2. Osteopenia. Electronically Signed   By: Vanetta Chou M.D.   On: 03/21/2024 13:15     Medications:     Chlorhexidine  Gluconate Cloth  6 each Topical Q0600   heparin   5,000 Units Subcutaneous Q12H   insulin  aspart  0-5 Units Subcutaneous QHS   insulin  aspart  0-9 Units Subcutaneous TID WC   sevelamer  carbonate  2,400 mg Oral TID WC   acetaminophen  **OR** acetaminophen , albuterol , HYDROmorphone  (DILAUDID ) injection, ondansetron  (ZOFRAN ) IV, oxyCODONE   Assessment/ Plan:  [name] is a 58 y.o.  female with past medical conditions including anemia, GERD, hypertension, stroke, diabetes, and end-stage renal disease on hemodialysis.  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  Currently on 2 L nasal cannula.  2.  End-stage renal disease on hemodialysis.  Receiving scheduled dialysis today, UF goal 0.5 L as tolerated.  Predialysis weight 84.5 kg, estimated dry weight.  Patient refused increased UF due to cramping during previous treatment.  3. Anemia of chronic kidney disease Lab Results  Component Value Date   HGB 11.6 (L) 03/22/2024    Hemoglobin 11.6, at last check.  No  need of ESA's at this time.    4. Secondary Hyperparathyroidism: with outpatient labs: PTH 1276, phosphorus 6.4, calcium  8.1 on 03/13/24.   Lab Results  Component Value Date   CALCIUM  9.5 03/22/2024   CAION 1.00 (L) 03/09/2024   PHOS 6.3 (H) 03/08/2024    Continue to monitor bone mineral soreness admission.   LOS: 0 Heman Que 7/23/20252:58 PM

## 2024-03-22 NOTE — ED Triage Notes (Signed)
 Pt was d/c yesterday from hospital; developed swelling to rt wrist and knuckles; wanted pt to stay and pt evaluated  but pt chose to have it f/u with outpatient; she has developed similar symptoms in left foot and left hand as well

## 2024-03-22 NOTE — Inpatient Diabetes Management (Signed)
 Inpatient Diabetes Program Recommendations  AACE/ADA: New Consensus Statement on Inpatient Glycemic Control (2015)  Target Ranges:  Prepandial:   less than 140 mg/dL      Peak postprandial:   less than 180 mg/dL (1-2 hours)      Critically ill patients:  140 - 180 mg/dL   Lab Results  Component Value Date   GLUCAP 234 (H) 03/21/2024   HGBA1C 9.8 (H) 03/03/2024    Review of Glycemic Control  Latest Reference Range & Units 03/20/24 14:13 03/20/24 15:50 03/20/24 20:48 03/21/24 08:53 03/21/24 12:54  Glucose-Capillary 70 - 99 mg/dL 786 (H) 736 (H) 799 (H) 276 (H) 234 (H)   Diabetes history: DM2 Outpatient Diabetes medications:  Novolog  14 units tid with meals  Current orders for Inpatient glycemic control:  Novolog  0-20 units tid with meals and HS  Inpatient Diabetes Program Recommendations:   Consider reducing Novolog  correction to sensitive (0-9 units) tid with meals.  May consider adding Novolog  meal coverage 2 units tid with meals (hold if patient eats less than 50% or NPO). Also consider adding Semglee  8 units daily.   Thanks,  Randall Bullocks, RN, BC-ADM Inpatient Diabetes Coordinator Pager [phone]  (8a-5p)

## 2024-03-22 NOTE — H&P (Signed)
 History and Physical    [name] FMW:[id number] DOB: 02/06/1966 DOA: 03/22/2024  PCP: Pcp, No (Confirm with patient/family/NH records and if not entered, this has to be entered at [facility] point of entry) Patient coming from: Home  I have personally briefly reviewed patient's old medical records in [facility]  Chief Complaint: Worsening of multiple joint pain and swelling  HPI: [name] is a 58 y.o. female with medical history significant of ESRD on HD MWF, chronic HFrEF with LVEF 40-45%, IDDM, peripheral neuropathy, HTN, HLD, CVA, coming back to us  with show for evaluation of worsening of multiple joint pain and swelling.  Patient was just released from hospital yesterday after hospital stay for treatment of multiple joint pain and swelling.  Symptoms started 4 weeks ago with increasing shortness of breath and peripheral swelling in multiple joints including bilateral ankles bilateral feet and bilateral wrist.  Came to hospital over the weekend, was found to be fluid overloaded and patient underwent emergent dialysis since then her symptoms appear to be improving.  She was discharged home yesterday.  Overnight however patient started to have worsening of multiple joint pain especially bilateral ankle pain made her hard to ambulate and put weight on her legs.  And decided to come back to hospital again last night.  ED Course: Afebrile, nontachycardic blood pressure 120/70 O2 saturation 100% on room air.  Chest x-ray showed mild pulmonary congestion, blood work showed BUN 48 creatinine 8.1 glucose 93 bicarb 27K 3.8.  Patient was given IV Dilaudid  and Percocet for her joint pains.  Review of Systems: As per HPI otherwise 14 point review of systems negative.    Past Medical History:  Diagnosis Date   Anemia    vitamin d3 deficiency   Anxiety    Chronic kidney disease    End Stage Renal Disease   Diabetes mellitus without complication (HCC)    GERD (gastroesophageal reflux disease)     nothing over last few years   High serum parathyroid hormone (PTH)    checked through Dialysis   History of kidney stones 2000   Hypertension    Neuromuscular disorder (HCC)    neuropathy in feet   PONV (postoperative nausea and vomiting)    severe nausea requiring many doses of post op antiemetics   Stroke (HCC) 10/2017   thinks she had a series of mini strokes.right leg up to right side of face were numb. no loss of consciousness    Past Surgical History:  Procedure Laterality Date   ABDOMINAL HYSTERECTOMY  2007   APPLICATION OF WOUND VAC Left 11/15/2021   Procedure: APPLICATION OF WOUND VAC;  Surgeon: Krasinski, Kevin, MD;  Location: [facility] ORS;  Service: Orthopedics;  Laterality: Left;  Prevena 13cm    APPLICATION OF WOUND VAC Right 12/14/2021   Procedure: APPLICATION OF WOUND VAC;  Surgeon: Tobie Priest, MD;  Location: [facility] ORS;  Service: Orthopedics;  Laterality: Right;  [id number]   AV FISTULA INSERTION W/ RF MAGNETIC GUIDANCE N/A 12/08/2017   Procedure: AV FISTULA INSERTION W/RF MAGNETIC GUIDANCE;  Surgeon: Jama Cordella MATSU, MD;  Location: [facility] INVASIVE CV LAB;  Service: Cardiovascular;  Laterality: N/A;   DIALYSIS/PERMA CATHETER INSERTION Right 10/03/2021   Procedure: DIALYSIS/PERMA CATHETER INSERTION;  Surgeon: Jama Cordella MATSU, MD;  Location: [facility] INVASIVE CV LAB;  Service: Cardiovascular;  Laterality: Right;   HIP ARTHROPLASTY Left 09/29/2021   Procedure: ARTHROPLASTY BIPOLAR HIP (HEMIARTHROPLASTY);  Surgeon: Leora Lynwood SAUNDERS, MD;  Location: [facility] ORS;  Service: Orthopedics;  Laterality: Left;  HIP ARTHROPLASTY Right 12/14/2021   Procedure: ARTHROPLASTY BIPOLAR HIP (HEMIARTHROPLASTY);  Surgeon: Tobie Priest, MD;  Location: [facility] ORS;  Service: Orthopedics;  Laterality: Right;   INCISION AND DRAINAGE HIP Left 11/15/2021   Procedure: IRRIGATION AND DEBRIDEMENT LEFT HIP WOUND;  Surgeon: Marchia Drivers, MD;  Location: [facility] ORS;  Service: Orthopedics;  Laterality: Left;   PACEMAKER  LEADLESS INSERTION N/A 03/09/2024   Procedure: PACEMAKER LEADLESS INSERTION;  Surgeon: Nancey Eulas BRAVO, MD;  Location: [facility] INVASIVE CV LAB;  Service: Cardiovascular;  Laterality: N/A;   QUADRICEPS TENDON REPAIR Right 10/16/2021   Procedure: REPAIR QUADRICEP TENDON;  Surgeon: Doll Skates, MD;  Location: [facility] OR;  Service: Orthopedics;  Laterality: Right;   ROTATOR CUFF REPAIR Right 2004   SHOULDER CLOSED REDUCTION Right 2004   UPPER EXTREMITY VENOGRAPHY Left 02/15/2018   Procedure: UPPER EXTREMITY VENOGRAPHY;  Surgeon: Jama Cordella MATSU, MD;  Location: [facility] INVASIVE CV LAB;  Service: Cardiovascular;  Laterality: Left;     reports that she has never smoked. She has never used smokeless tobacco. She reports that she does not drink alcohol and does not use drugs.  Allergies  Allergen Reactions   Enalapril Hives and Other (See Comments)    Angioedema face.   Ivp Dye [Iodinated Contrast Media] Hives   Lisinopril Shortness Of Breath    Family History  Problem Relation Age of Onset   Emphysema Mother    COPD Mother    Cancer Father    Cancer Paternal Aunt    Diabetes Sister    Hypertension Sister    Eczema Sister    Diabetes Brother    Hypertension Brother    Cardiomyopathy Brother    Alcohol abuse Brother      Prior to Admission medications   Medication Sig Start Date End Date Taking? Authorizing Provider  albuterol  (VENTOLIN  HFA) 108 (90 Base) MCG/ACT inhaler Inhale 2 puffs into the lungs every 6 (six) hours as needed for wheezing or shortness of breath. 02/20/24  Yes [name], MD  NOVOLOG  FLEXPEN 100 UNIT/ML FlexPen Inject 14 Units into the skin 3 (three) times daily with meals.   Yes [provider]  sevelamer  carbonate (RENVELA ) 800 MG tablet Take 2,400 mg by mouth 3 (three) times daily with meals. 12/18/23  Yes [provider]  Vitamin D , Ergocalciferol , (DRISDOL ) 1.25 MG (50000 UNIT) CAPS capsule Take 1 capsule (50,000 Units total) by mouth every 7  (seven) days. Patient taking differently: Take 50,000 Units by mouth every 7 (seven) days. Monday 11/10/21  Yes Pegge Toribio PARAS, PA-C    Physical Exam: Vitals:   03/22/24 0326 03/22/24 0605 03/22/24 0625 03/22/24 0700  BP:  135/81  123/70  Pulse:  81 71 73  Resp:  16  17  Temp:  98.2 F (36.8 C)    TempSrc:  Oral    SpO2:  98% (!) 89% 100%  Weight: 84.5 kg     Height: 5' 6 (1.676 m)       Constitutional: NAD, calm, comfortable Vitals:   03/22/24 0326 03/22/24 0605 03/22/24 0625 03/22/24 0700  BP:  135/81  123/70  Pulse:  81 71 73  Resp:  16  17  Temp:  98.2 F (36.8 C)    TempSrc:  Oral    SpO2:  98% (!) 89% 100%  Weight: 84.5 kg     Height: 5' 6 (1.676 m)      Eyes: PERRL, lids and conjunctivae normal ENMT: Mucous membranes are moist. Posterior pharynx clear of any  exudate or lesions.Normal dentition.  Neck: normal, supple, no masses, no thyromegaly Respiratory: clear to auscultation bilaterally, no wheezing, no crackles. Normal respiratory effort. No accessory muscle use.  Cardiovascular: Regular rate and rhythm, no murmurs / rubs / gallops. 2+ extremity edema. 2+ pedal pulses. No carotid bruits.  Abdomen: no tenderness, no masses palpated. No hepatosplenomegaly. Bowel sounds positive.  Musculoskeletal: Multiple joint swelling bilateral ankles, bilateral wrist bilateral feet with reduced ROM and tenderness Skin: no rashes, lesions, ulcers. No induration Neurologic: CN 2-12 grossly intact. Sensation intact, DTR normal. Strength 5/5 in all 4.  Psychiatric: Normal judgment and insight. Alert and oriented x 3. Normal mood.     Labs on Admission: I have personally reviewed following labs and imaging studies  CBC: Recent Labs  Lab 03/19/24 2233 03/20/24 0603 03/22/24 0503  WBC 7.0 7.3 9.8  NEUTROABS  --   --  8.4*  HGB 10.9* 10.6* 11.6*  HCT 35.8* 34.6* 37.6  MCV 103.5* 104.8* 101.9*  PLT 203 209 159   Basic Metabolic Panel: Recent Labs  Lab 03/19/24 2233  03/20/24 0603 03/21/24 0521 03/22/24 0503  NA 137 136 132* 133*  K 5.1 4.4 4.1 4.6  CL 93* 91* 92* 92*  CO2 24 25 23 22   GLUCOSE 251* 216* 188* 199*  BUN 49* 52* 32* 48*  CREATININE 8.40* 8.94* 6.32* 8.17*  CALCIUM  9.2 9.2 9.4 9.5   GFR: Estimated Creatinine Clearance: 8.1 mL/min (A) (by C-G formula based on SCr of 8.17 mg/dL (H)). Liver Function Tests: No results for input(s): AST, ALT, ALKPHOS, BILITOT, PROT, ALBUMIN in the last 168 hours. No results for input(s): LIPASE, AMYLASE in the last 168 hours. No results for input(s): AMMONIA in the last 168 hours. Coagulation Profile: No results for input(s): INR, PROTIME in the last 168 hours. Cardiac Enzymes: No results for input(s): CKTOTAL, CKMB, CKMBINDEX, TROPONINI in the last 168 hours. BNP (last 3 results) No results for input(s): PROBNP in the last 8760 hours. HbA1C: No results for input(s): HGBA1C in the last 72 hours. CBG: Recent Labs  Lab 03/20/24 1413 03/20/24 1550 03/20/24 2048 03/21/24 0853 03/21/24 1254  GLUCAP 213* 263* 200* 276* 234*   Lipid Profile: No results for input(s): CHOL, HDL, LDLCALC, TRIG, CHOLHDL, LDLDIRECT in the last 72 hours. Thyroid  Function Tests: No results for input(s): TSH, T4TOTAL, FREET4, T3FREE, THYROIDAB in the last 72 hours. Anemia Panel: No results for input(s): VITAMINB12, FOLATE, FERRITIN, TIBC, IRON , RETICCTPCT in the last 72 hours. Urine analysis: No results found for: COLORURINE, APPEARANCEUR, LABSPEC, PHURINE, GLUCOSEU, HGBUR, BILIRUBINUR, KETONESUR, PROTEINUR, UROBILINOGEN, NITRITE, LEUKOCYTESUR  Radiological Exams on Admission: DG Ankle Complete Left Result Date: 03/22/2024 EXAM: 3 or more VIEW(S) XRAY OF THE LEFT ANKLE 03/22/2024 05:13:09 AM CLINICAL HISTORY: Left ankle pain and swelling with no injury. COMPARISON: None available. FINDINGS: BONES AND JOINTS: No acute fracture. No  focal osseous lesion. No joint dislocation. Spurring is present at the achilles tendon insertion. SOFT TISSUES: Diffuse soft tissue swelling is present about the ankle. Microvascular calcifications are present. IMPRESSION: 1. Diffuse soft tissue swelling about the ankle. 2. No acute osseous abnormality. Electronically signed by: Lonni Necessary MD 03/22/2024 05:45 AM EDT RP Workstation: [id number]   DG Wrist 2 Views Right Result Date: 03/21/2024 CLINICAL DATA:  Right upper extremity pain. EXAM: RIGHT WRIST - 2 VIEW; RIGHT HAND - 2 VIEW COMPARISON:  None Available. FINDINGS: No acute fracture or dislocation. The bones are osteopenic. The soft tissues are unremarkable. Vascular calcifications noted. IMPRESSION: 1. No acute  fracture or dislocation. 2. Osteopenia. Electronically Signed   By: Vanetta Chou M.D.   On: 03/21/2024 13:15   DG Hand 2 View Right Result Date: 03/21/2024 CLINICAL DATA:  Right upper extremity pain. EXAM: RIGHT WRIST - 2 VIEW; RIGHT HAND - 2 VIEW COMPARISON:  None Available. FINDINGS: No acute fracture or dislocation. The bones are osteopenic. The soft tissues are unremarkable. Vascular calcifications noted. IMPRESSION: 1. No acute fracture or dislocation. 2. Osteopenia. Electronically Signed   By: Vanetta Chou M.D.   On: 03/21/2024 13:15    EKG: None  Assessment/Plan Principal Problem:   Polyarthritis  (please populate well all problems here in Problem List. (For example, if patient is on BP meds at home and you resume or decide to hold them, it is a problem that needs to be her. Same for CAD, COPD, HLD and so on)  Acute on chronic HFrEF decompensation - Patient still have symptoms signs of fluid overload - Discussed with on-call nephrology, who will arrange emergent dialysis today  Acute ambulatory impairment Multiple joint pain -Review of recent hospitalization record showed there was suspicion for gout. - On narcotics for pain control - Outpatient follow-up  with PCP and/or rheumatology  IDDM -SSI     DVT prophylaxis: Heparin  subcu Code Status: Full code Family Communication: None at bedside Disposition Plan: Expect less than 2 midnight hospital stay Consults called: Nephrology Admission status: Telemetry observation   Cort ONEIDA Mana MD [facility] Pager [phone]  03/22/2024, 8:54 AM

## 2024-03-23 ENCOUNTER — Telehealth: Payer: Self-pay | Admitting: Cardiology

## 2024-03-23 ENCOUNTER — Ambulatory Visit: Admitting: Cardiology

## 2024-03-23 DIAGNOSIS — N186 End stage renal disease: Secondary | ICD-10-CM

## 2024-03-23 DIAGNOSIS — Z992 Dependence on renal dialysis: Secondary | ICD-10-CM

## 2024-03-23 DIAGNOSIS — I5022 Chronic systolic (congestive) heart failure: Secondary | ICD-10-CM

## 2024-03-23 DIAGNOSIS — M130 Polyarthritis, unspecified: Secondary | ICD-10-CM | POA: Diagnosis not present

## 2024-03-23 LAB — CBG MONITORING, ED
Glucose-Capillary: 344 mg/dL — ABNORMAL HIGH (ref 70–99)
Glucose-Capillary: 368 mg/dL — ABNORMAL HIGH (ref 70–99)
Glucose-Capillary: 321 mg/dL — ABNORMAL HIGH (ref 70–99)
Glucose-Capillary: 435 mg/dL — ABNORMAL HIGH (ref 70–99)

## 2024-03-23 LAB — BASIC METABOLIC PANEL WITH GFR
Anion gap: 15 (ref 5–15)
BUN: 30 mg/dL — ABNORMAL HIGH (ref 6–20)
CO2: 24 mmol/L (ref 22–32)
Calcium: 8.9 mg/dL (ref 8.9–10.3)
Chloride: 93 mmol/L — ABNORMAL LOW (ref 98–111)
Creatinine, Ser: 5.19 mg/dL — ABNORMAL HIGH (ref 0.44–1.00)
GFR, Estimated: 9 mL/min — ABNORMAL LOW (ref >60)
Glucose, Bld: 332 mg/dL — ABNORMAL HIGH (ref 70–99)
Potassium: 5.3 mmol/L — ABNORMAL HIGH (ref 3.5–5.1)
Sodium: 132 mmol/L — ABNORMAL LOW (ref 135–145)

## 2024-03-23 LAB — URIC ACID: Uric Acid, Serum: 4 mg/dL (ref 2.5–7.1)

## 2024-03-23 MED ORDER — INSULIN GLARGINE-YFGN 100 UNIT/ML ~~LOC~~ SOLN
8.0000 [IU] | Freq: Every day | SUBCUTANEOUS | Status: DC
  Administered 2024-03-23: 8 [IU] via SUBCUTANEOUS
  Filled 2024-03-23 (×2): qty 0.08

## 2024-03-23 MED ORDER — PREDNISOLONE 5 MG PO TABS
20.0000 mg | ORAL_TABLET | Freq: Every day | ORAL | Status: DC
  Administered 2024-03-23: 20 mg via ORAL
  Filled 2024-03-23 (×2): qty 4

## 2024-03-23 MED ORDER — COLCHICINE 0.6 MG PO TABS
0.6000 mg | ORAL_TABLET | Freq: Every day | ORAL | Status: DC
  Administered 2024-03-23: 0.6 mg via ORAL
  Filled 2024-03-23 (×2): qty 1

## 2024-03-23 NOTE — ED Notes (Signed)
 Velvet made aware of patient BS of 435 mg/dl. 5 units of insulin  given to patient.

## 2024-03-23 NOTE — ED Notes (Signed)
 Pt says she will leave AMA before accepting the shared room assignment. Charge, and Erminio HERO NP made aware.

## 2024-03-23 NOTE — Telephone Encounter (Signed)
 Patients spouse wanted to know if they could remove her bandage at her pacemaker site. He reports there is no redness, no drainage, and no pus. He states that he has been changing out the bandage every day and no problems noted. Advised that should be fine but that I would check with our device team. He verbalized understanding.

## 2024-03-23 NOTE — Telephone Encounter (Signed)
 Husband calling in about the bandages on the patient. Please advise

## 2024-03-23 NOTE — ED Notes (Signed)
 Patient refusing bed assignment on 2nd floor due to shared room. Charge and on call practitioner Rhilee Currin made aware.

## 2024-03-23 NOTE — Progress Notes (Signed)
 [facility]  - [place] at [facility]   PATIENT NAME: [name]    MR#:  [id number]  DATE OF BIRTH:  1966-02-27  SUBJECTIVE:  family at bedside. Patient tells me she is feeling a lot better. Her pain is down to three. She tells me purple pill is helping me. Got dialyzed yesterday    VITALS:  Blood pressure (!) 150/72, pulse 78, temperature 97.8 F (36.6 C), temperature source Oral, resp. rate 17, height 5' 6 (1.676 m), weight 84.4 kg, SpO2 100%.  PHYSICAL EXAMINATION:   GENERAL:  58 y.o.-year-old patient with no acute distress.  LUNGS: Normal breath sounds bilaterally, no wheezing CARDIOVASCULAR: S1, S2 normal. No murmur   ABDOMEN: Soft, nontender, nondistended. Bowel sounds present.  EXTREMITIES: right hand swelling improving. Patient is bilateral lower extremity edema which is chronic more so on the left ankle NEUROLOGIC: nonfocal  patient is alert and awake SKIN: venous stasis changes lower extremity  LABORATORY PANEL:  CBC Recent Labs  Lab 03/22/24 0503  WBC 9.8  HGB 11.6*  HCT 37.6  PLT 159    Chemistries  Recent Labs  Lab 03/23/24 0642  NA 132*  K 5.3*  CL 93*  CO2 24  GLUCOSE 332*  BUN 30*  CREATININE 5.19*  CALCIUM  8.9   Cardiac Enzymes No results for input(s): TROPONINI in the last 168 hours. RADIOLOGY:  DG Chest 1 View Result Date: 03/22/2024 CLINICAL DATA:  CHF. EXAM: CHEST  1 VIEW COMPARISON:  03/19/2024. FINDINGS: Low lung volumes. Stable cardiomegaly. Stable right IJ CVC catheter tip overlies the right atrium. Slightly decreased bilateral perihilar interstitial prominence. No focal consolidation, sizeable pleural effusion, or pneumothorax. The visualized osseous structures are unchanged. IMPRESSION: Low lung volumes. Stable cardiomegaly with slightly decreased bilateral perihilar interstitial prominence, favored to reflect perihilar edema. Electronically Signed   By: [name] M.D.   On: 03/22/2024 09:23   DG Ankle  Complete Left Result Date: 03/22/2024 EXAM: 3 or more VIEW(S) XRAY OF THE LEFT ANKLE 03/22/2024 05:13:09 AM CLINICAL HISTORY: Left ankle pain and swelling with no injury. COMPARISON: None available. FINDINGS: BONES AND JOINTS: No acute fracture. No focal osseous lesion. No joint dislocation. Spurring is present at the achilles tendon insertion. SOFT TISSUES: Diffuse soft tissue swelling is present about the ankle. Microvascular calcifications are present. IMPRESSION: 1. Diffuse soft tissue swelling about the ankle. 2. No acute osseous abnormality. Electronically signed by: Elizabeth Necessary MD 03/22/2024 05:45 AM EDT RP Workstation: [id number]    Assessment and Plan [name] is a 58 y.o. female with medical history significant of ESRD on HD MWF, chronic HFrEF with LVEF 40-45%, IDDM, peripheral neuropathy, HTN, HLD, CVA, coming back to us  with show for evaluation of worsening of multiple joint pain and swelling.   Patient was just released from hospital yesterday after hospital stay for treatment of multiple joint pain and swelling.  Symptoms started 4 weeks ago with increasing shortness of breath and peripheral swelling in multiple joints including bilateral ankles bilateral feet and bilateral wrist.   Polyarthralgia/DJD -- patient started on IV Solu-Medrol  feels a lot better. Change to PO prednisone and continue empiric colchicine  for elevated uric acid which has normalized -- Imaging studies don't show any joint destruction. Suggestive osteopenia. -- CRP elevated -- PRN pain meds -- patient recommended to follow-up with PCP and consider rheumatology consultation if symptoms recur  End-stage renal disease on hemodialysis -- resumed HD  History of congestive heart failure systolic -- patient does not appear to be  volume overloaded. She does have a bilateral lower extremity edema which is chronic more on the left -- wean to room air.  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  >50% time spent on counselling and coordination of care  Note: This dictation was prepared with Dragon dictation along with smaller phrase technology. Any transcriptional errors that result from this process are unintentional.  [name] M.D    [facility]   CC: Primary care physician; Elizabeth Chew, MD

## 2024-03-23 NOTE — Telephone Encounter (Signed)
 I spoke with Dr. Nancey and he said it will be okay for the bandage to come off. Their is no steri strips or glu.

## 2024-03-23 NOTE — ED Notes (Signed)
 Charge at bedside talking with patient and patients husband.

## 2024-03-23 NOTE — Progress Notes (Signed)
 [facility]  ROUNDING NOTE   Subjective:   [name] is a 58 year old female with past medical conditions including anemia, GERD, hypertension, stroke, diabetes, and end-stage renal disease on hemodialysis.  Patient presents to the emergency department with lower extremity edema and has been admitted under observation for CHF (congestive heart failure) (HCC) [I50.9] Joint swelling [M25.40] Polyarthritis [M13.0]  Patient is known to our practice and receives outpatient dialysis treatments at [facility] MWF schedule, supervised by Dr. Marcelino.    Update Patient seen resting in bed States she had excruciating pain overnight in both hands Was given Solu-Medrol  and pain medications.  Objective:  Vital signs in last 24 hours:  Temp:  [97.6 F (36.4 C)-98.1 F (36.7 C)] 97.8 F (36.6 C) (07/24 1037) Pulse Rate:  [43-81] 78 (07/24 1300) Resp:  [16-25] 17 (07/24 1300) BP: (91-150)/(56-89) 150/72 (07/24 1300) SpO2:  [91 %-100 %] 100 % (07/24 1300) FiO2 (%):  [2 %] 2 % (07/24 0500) Weight:  [84.4 kg] 84.4 kg (07/23 1702)  Weight change: 0 kg Filed Weights   03/22/24 0326 03/22/24 1256 03/22/24 1702  Weight: 84.5 kg 84.5 kg 84.4 kg    Intake/Output: I/O last 3 completed shifts: In: -  Out: 500 [Other:500]   Intake/Output this shift:  No intake/output data recorded.  Physical Exam: General: NAD  Head: Normocephalic, atraumatic. Moist oral mucosal membranes  Eyes: Anicteric  Neck: Supple  Lungs:  Clear to auscultation  Heart: Regular rate and rhythm  Abdomen:  Soft, nontender  Extremities: Trace peripheral edema.  Bilateral hand edema  Neurologic: Awake, alert, conversant  Skin: Warm,dry, no rash  Access: Right IJ PermCath    Basic Metabolic Panel: Recent Labs  Lab 03/19/24 2233 03/20/24 0603 03/21/24 0521 03/22/24 0503 03/23/24 0642  NA 137 136 132* 133* 132*  K 5.1 4.4 4.1 4.6 5.3*  CL 93* 91* 92* 92* 93*  CO2 24 25 23 22 24   GLUCOSE  251* 216* 188* 199* 332*  BUN 49* 52* 32* 48* 30*  CREATININE 8.40* 8.94* 6.32* 8.17* 5.19*  CALCIUM  9.2 9.2 9.4 9.5 8.9    Liver Function Tests: No results for input(s): AST, ALT, ALKPHOS, BILITOT, PROT, ALBUMIN in the last 168 hours. No results for input(s): LIPASE, AMYLASE in the last 168 hours. No results for input(s): AMMONIA in the last 168 hours.  CBC: Recent Labs  Lab 03/19/24 2233 03/20/24 0603 03/22/24 0503  WBC 7.0 7.3 9.8  NEUTROABS  --   --  8.4*  HGB 10.9* 10.6* 11.6*  HCT 35.8* 34.6* 37.6  MCV 103.5* 104.8* 101.9*  PLT 203 209 159    Cardiac Enzymes: No results for input(s): CKTOTAL, CKMB, CKMBINDEX, TROPONINI in the last 168 hours.  BNP: Invalid input(s): POCBNP  CBG: Recent Labs  Lab 03/21/24 1254 03/22/24 1754 03/22/24 2152 03/23/24 0730 03/23/24 1108  GLUCAP 234* 189* 274* 344* 368*    Microbiology: Results for orders placed or performed during the hospital encounter of 12/14/21  Surgical pcr screen     Status: None   Collection Time: 12/14/21 12:17 AM   Specimen: Nasal Mucosa; Nasal Swab  Result Value Ref Range Status   MRSA, PCR NEGATIVE NEGATIVE Final   Staphylococcus aureus NEGATIVE NEGATIVE Final    Comment: (NOTE) The Xpert SA Assay (FDA approved for NASAL specimens in patients 41 years of age and older), is one component of a comprehensive surveillance program. It is not intended to diagnose infection nor to guide or monitor treatment. Performed at  [facility], [address], [place], [place] [address]     Coagulation Studies: No results for input(s): LABPROT, INR in the last 72 hours.  Urinalysis: No results for input(s): COLORURINE, LABSPEC, PHURINE, GLUCOSEU, HGBUR, BILIRUBINUR, KETONESUR, PROTEINUR, UROBILINOGEN, NITRITE, LEUKOCYTESUR in the last 72 hours.  Invalid input(s): APPERANCEUR    Imaging: DG Chest 1 View Result Date: 03/22/2024 CLINICAL  DATA:  CHF. EXAM: CHEST  1 VIEW COMPARISON:  03/19/2024. FINDINGS: Low lung volumes. Stable cardiomegaly. Stable right IJ CVC catheter tip overlies the right atrium. Slightly decreased bilateral perihilar interstitial prominence. No focal consolidation, sizeable pleural effusion, or pneumothorax. The visualized osseous structures are unchanged. IMPRESSION: Low lung volumes. Stable cardiomegaly with slightly decreased bilateral perihilar interstitial prominence, favored to reflect perihilar edema. Electronically Signed   By: Harrietta Sherry M.D.   On: 03/22/2024 09:23   DG Ankle Complete Left Result Date: 03/22/2024 EXAM: 3 or more VIEW(S) XRAY OF THE LEFT ANKLE 03/22/2024 05:13:09 AM CLINICAL HISTORY: Left ankle pain and swelling with no injury. COMPARISON: None available. FINDINGS: BONES AND JOINTS: No acute fracture. No focal osseous lesion. No joint dislocation. Spurring is present at the achilles tendon insertion. SOFT TISSUES: Diffuse soft tissue swelling is present about the ankle. Microvascular calcifications are present. IMPRESSION: 1. Diffuse soft tissue swelling about the ankle. 2. No acute osseous abnormality. Electronically signed by: Lonni Necessary MD 03/22/2024 05:45 AM EDT RP Workstation: [id number]     Medications:     Chlorhexidine  Gluconate Cloth  6 each Topical Q0600   heparin   5,000 Units Subcutaneous Q12H   insulin  aspart  0-5 Units Subcutaneous QHS   insulin  aspart  0-9 Units Subcutaneous TID WC   insulin  glargine-yfgn  8 Units Subcutaneous Daily   sevelamer  carbonate  2,400 mg Oral TID WC   acetaminophen  **OR** acetaminophen , albuterol , oxyCODONE   Assessment/ Plan:  [name] is a 58 y.o.  female with past medical conditions including anemia, GERD, hypertension, stroke, diabetes, and end-stage renal disease on hemodialysis.  Patient presents to the emergency department with shortness of breath and has been admitted under observation for CHF (congestive heart  failure) (HCC) [I50.9] Joint swelling [M25.40] Polyarthritis [M13.0]  [facility]/MWF/Rt internal jugular permcath  Acute respiratory failure, .Chest x-ray shows stable cardiomegaly with perihilar edema.  Remains on 2 L nasal cannula.  2.  End-stage renal disease on hemodialysis.  Receiving scheduled dialysis today, UF goal 0.5 L as tolerated.  Predialysis weight 84.5 kg, estimated dry weight.  Patient received dialysis yesterday, UF 500 mL achieved.  Next treatment scheduled for Friday.  3. Anemia of chronic kidney disease Lab Results  Component Value Date   HGB 11.6 (L) 03/22/2024    Hemoglobin 11.6, at last check.  No need of ESA's at this time.    4. Secondary Hyperparathyroidism: with outpatient labs: PTH 1276, phosphorus 6.4, calcium  8.1 on 03/13/24.   Lab Results  Component Value Date   CALCIUM  8.9 03/23/2024   CAION 1.00 (L) 03/09/2024   PHOS 6.3 (H) 03/08/2024    Continue to monitor bone mineral soreness admission.  Will obtain updated phosphorus level in AM.   LOS: 0 Jeweldean Drohan 7/24/20251:25 PM

## 2024-03-23 NOTE — Telephone Encounter (Signed)
 Spoke with patient and reviewed that Dr. Nancey said it was ok to take bandage off. She verbalized understanding and was appreciative for the call. No further needs at this time.

## 2024-03-24 DIAGNOSIS — I5022 Chronic systolic (congestive) heart failure: Secondary | ICD-10-CM | POA: Diagnosis not present

## 2024-03-24 DIAGNOSIS — Z992 Dependence on renal dialysis: Secondary | ICD-10-CM | POA: Diagnosis not present

## 2024-03-24 DIAGNOSIS — N186 End stage renal disease: Secondary | ICD-10-CM | POA: Diagnosis not present

## 2024-03-24 DIAGNOSIS — M130 Polyarthritis, unspecified: Secondary | ICD-10-CM | POA: Diagnosis not present

## 2024-03-24 LAB — CBC
HCT: 34.8 % — ABNORMAL LOW (ref 36.0–46.0)
Hemoglobin: 10.8 g/dL — ABNORMAL LOW (ref 12.0–15.0)
MCH: 32.1 pg (ref 26.0–34.0)
MCHC: 31 g/dL (ref 30.0–36.0)
MCV: 103.6 fL — ABNORMAL HIGH (ref 80.0–100.0)
Platelets: 180 K/uL (ref 150–400)
RBC: 3.36 MIL/uL — ABNORMAL LOW (ref 3.87–5.11)
RDW: 16.3 % — ABNORMAL HIGH (ref 11.5–15.5)
WBC: 13 K/uL — ABNORMAL HIGH (ref 4.0–10.5)
nRBC: 0 % (ref 0.0–0.2)

## 2024-03-24 LAB — RENAL FUNCTION PANEL
Albumin: 3.5 g/dL (ref 3.5–5.0)
Anion gap: 19 — ABNORMAL HIGH (ref 5–15)
BUN: 50 mg/dL — ABNORMAL HIGH (ref 6–20)
CO2: 23 mmol/L (ref 22–32)
Calcium: 9.2 mg/dL (ref 8.9–10.3)
Chloride: 92 mmol/L — ABNORMAL LOW (ref 98–111)
Creatinine, Ser: 7.12 mg/dL — ABNORMAL HIGH (ref 0.44–1.00)
GFR, Estimated: 6 mL/min — ABNORMAL LOW (ref >60)
Glucose, Bld: 329 mg/dL — ABNORMAL HIGH (ref 70–99)
Phosphorus: 6.4 mg/dL — ABNORMAL HIGH (ref 2.5–4.6)
Potassium: 5.2 mmol/L — ABNORMAL HIGH (ref 3.5–5.1)
Sodium: 134 mmol/L — ABNORMAL LOW (ref 135–145)

## 2024-03-24 LAB — CBG MONITORING, ED: Glucose-Capillary: 290 mg/dL — ABNORMAL HIGH (ref 70–99)

## 2024-03-24 MED ORDER — HEPARIN SODIUM (PORCINE) 1000 UNIT/ML IJ SOLN
INTRAMUSCULAR | Status: AC
  Filled 2024-03-24: qty 10

## 2024-03-24 MED ORDER — COLCHICINE 0.6 MG PO TABS: 0.6000 mg | ORAL_TABLET | Freq: Every day | ORAL | 0 refills | Status: DC

## 2024-03-24 MED ORDER — PREDNISONE 20 MG PO TABS
20.0000 mg | ORAL_TABLET | Freq: Every day | ORAL | 0 refills | Status: AC
Start: 2024-03-25 — End: 2024-03-28

## 2024-03-24 MED ORDER — INSULIN GLARGINE-YFGN 100 UNIT/ML ~~LOC~~ SOLN
12.0000 [IU] | Freq: Every day | SUBCUTANEOUS | Status: DC
  Filled 2024-03-24 (×2): qty 0.12

## 2024-03-24 MED ORDER — OXYCODONE HCL 5 MG PO TABS: 5.0000 mg | ORAL_TABLET | Freq: Two times a day (BID) | ORAL | 0 refills | Status: DC | PRN

## 2024-03-24 NOTE — Progress Notes (Signed)
 Hemodialysis Note:  Received patient in bed to unit. Alert and oriented. Informed consent singed and in chart.  Treatment initiated: 0810 Treatment completed: 1158  Access used: Right internal jugular catheter Access issues: None  Patient tolerated well. Transported back to room, alert without acute distress. Report given to patient's RN.  Total UF removed: 500 ml Medications given: None  Post HD weight: 85.3 Kg  [name]

## 2024-03-24 NOTE — Progress Notes (Signed)
 [facility]  ROUNDING NOTE   Subjective:   [name] is a 58 year old female with past medical conditions including anemia, GERD, hypertension, stroke, diabetes, and end-stage renal disease on hemodialysis.  Patient presents to the emergency department with lower extremity edema and has been admitted under observation for CHF (congestive heart failure) (HCC) [I50.9] Joint swelling [M25.40] Polyarthritis [M13.0]  Patient is known to our practice and receives outpatient dialysis treatments at [facility] MWF schedule, supervised by Dr. Marcelino.    Update Patient seen and evaluated during hemodialysis treatment today. Tolerating well. UF target only 0.5 kg.    HEMODIALYSIS FLOWSHEET:  Blood Flow Rate (mL/min): 400 mL/min Arterial Pressure (mmHg): -176.15 mmHg Venous Pressure (mmHg): 246.86 mmHg TMP (mmHg): 7.27 mmHg Ultrafiltration Rate (mL/min): 365 mL/min Dialysate Flow Rate (mL/min): 299 ml/min   Objective:  Vital signs in last 24 hours:  Temp:  [97.5 F (36.4 C)-98.3 F (36.8 C)] 97.5 F (36.4 C) (07/25 0758) Pulse Rate:  [78-104] 94 (07/25 1030) Resp:  [16-21] 17 (07/25 0900) BP: (99-150)/(59-96) 119/82 (07/25 1030) SpO2:  [96 %-100 %] 100 % (07/25 1030) Weight:  [84.5 kg] 84.5 kg (07/25 0758)  Weight change:  Filed Weights   03/22/24 1256 03/22/24 1702 03/24/24 0758  Weight: 84.5 kg 84.4 kg 84.5 kg    Intake/Output: No intake/output data recorded.   Intake/Output this shift:  No intake/output data recorded.  Physical Exam: General: NAD  Head: Normocephalic, atraumatic. Moist oral mucosal membranes  Eyes: Anicteric  Neck: Supple  Lungs:  Clear to auscultation  Heart: Regular rate and rhythm  Abdomen:  Soft, nontender  Extremities: Trace peripheral edema.  Bilateral hand edema  Neurologic: Awake, alert, conversant  Skin: Warm,dry, no rash  Access: Right IJ PermCath    Basic Metabolic Panel: Recent Labs  Lab 03/20/24 0603  03/21/24 0521 03/22/24 0503 03/23/24 0642 03/24/24 0741  NA 136 132* 133* 132* 134*  K 4.4 4.1 4.6 5.3* 5.2*  CL 91* 92* 92* 93* 92*  CO2 25 23 22 24 23   GLUCOSE 216* 188* 199* 332* 329*  BUN 52* 32* 48* 30* 50*  CREATININE 8.94* 6.32* 8.17* 5.19* 7.12*  CALCIUM  9.2 9.4 9.5 8.9 9.2  PHOS  --   --   --   --  6.4*    Liver Function Tests: Recent Labs  Lab 03/24/24 0741  ALBUMIN 3.5   No results for input(s): LIPASE, AMYLASE in the last 168 hours. No results for input(s): AMMONIA in the last 168 hours.  CBC: Recent Labs  Lab 03/19/24 2233 03/20/24 0603 03/22/24 0503 03/24/24 0741  WBC 7.0 7.3 9.8 13.0*  NEUTROABS  --   --  8.4*  --   HGB 10.9* 10.6* 11.6* 10.8*  HCT 35.8* 34.6* 37.6 34.8*  MCV 103.5* 104.8* 101.9* 103.6*  PLT 203 209 159 180    Cardiac Enzymes: No results for input(s): CKTOTAL, CKMB, CKMBINDEX, TROPONINI in the last 168 hours.  BNP: Invalid input(s): POCBNP  CBG: Recent Labs  Lab 03/23/24 0730 03/23/24 1108 03/23/24 1733 03/23/24 2143 03/24/24 0206  GLUCAP 344* 368* 321* 435* 290*    Microbiology: Results for orders placed or performed during the hospital encounter of 12/14/21  Surgical pcr screen     Status: None   Collection Time: 12/14/21 12:17 AM   Specimen: Nasal Mucosa; Nasal Swab  Result Value Ref Range Status   MRSA, PCR NEGATIVE NEGATIVE Final   Staphylococcus aureus NEGATIVE NEGATIVE Final    Comment: (NOTE) The Xpert SA  Assay (FDA approved for NASAL specimens in patients 18 years of age and older), is one component of a comprehensive surveillance program. It is not intended to diagnose infection nor to guide or monitor treatment. Performed at [facility], [address] [place], [place] [address]     Coagulation Studies: No results for input(s): LABPROT, INR in the last 72 hours.  Urinalysis: No results for input(s): COLORURINE, LABSPEC, PHURINE, GLUCOSEU, HGBUR,  BILIRUBINUR, KETONESUR, PROTEINUR, UROBILINOGEN, NITRITE, LEUKOCYTESUR in the last 72 hours.  Invalid input(s): APPERANCEUR    Imaging: No results found.    Medications:     Chlorhexidine  Gluconate Cloth  6 each Topical Q0600   colchicine   0.6 mg Oral Daily   heparin   5,000 Units Subcutaneous Q12H   insulin  aspart  0-5 Units Subcutaneous QHS   insulin  aspart  0-9 Units Subcutaneous TID WC   insulin  glargine-yfgn  12 Units Subcutaneous Daily   prednisoLONE   20 mg Oral Daily   sevelamer  carbonate  2,400 mg Oral TID WC   acetaminophen  **OR** acetaminophen , albuterol , oxyCODONE   Assessment/ Plan:  Ms. Gerianne Simonet is a 58 y.o.  female with past medical conditions including anemia, GERD, hypertension, stroke, diabetes, and end-stage renal disease on hemodialysis.  Patient presents to the emergency department with shortness of breath and has been admitted under observation for CHF (congestive heart failure) (HCC) [I50.9] Joint swelling [M25.40] Polyarthritis [M13.0]  [facility]/MWF/Rt internal jugular permcath  Acute respiratory failure. Chest x-ray shows stable cardiomegaly with perihilar edema..  Breathing comfortably this a.m.  2.  End-stage renal disease on hemodialysis.  Patient seen and evaluated during hemodialysis treatment today.  UF target 0.5 kg.  3. Anemia of chronic kidney disease Lab Results  Component Value Date   HGB 10.8 (L) 03/24/2024    Hemoglobin currently 10.8 and acceptable.  Resume Mircera as outpatient.  4. Secondary Hyperparathyroidism: with outpatient labs: PTH 1276, phosphorus 6.4, calcium  8.1 on 03/13/24.   Lab Results  Component Value Date   CALCIUM  9.2 03/24/2024   CAION 1.00 (L) 03/09/2024   PHOS 6.4 (H) 03/24/2024    Phosphorus still a bit high at 6.4.  Continue Renvela  2400 mg 3 times daily with meals.   LOS: 0 Bethann Qualley 7/25/202511:11 AM

## 2024-03-24 NOTE — Discharge Instructions (Signed)
 Resume HD on your routine schedule as before

## 2024-03-24 NOTE — Inpatient Diabetes Management (Signed)
 Inpatient Diabetes Program Recommendations  AACE/ADA: New Consensus Statement on Inpatient Glycemic Control (2015)  Target Ranges:  Prepandial:   less than 140 mg/dL      Peak postprandial:   less than 180 mg/dL (1-2 hours)      Critically ill patients:  140 - 180 mg/dL   Lab Results  Component Value Date   GLUCAP 290 (H) 03/24/2024   HGBA1C 9.8 (H) 03/03/2024    Review of Glycemic Control  Latest Reference Range & Units 03/23/24 21:43 03/24/24 02:06  Glucose-Capillary 70 - 99 mg/dL 564 (H) 709 (H)  Diabetes history: DM2 Outpatient Diabetes medications:  Novolog  14 units tid with meals  Current orders for Inpatient glycemic control:  Semglee  12 units daily Novolog  sensitive tid with meals and HS Inpatient Diabetes Program Recommendations:    Consider adding Novolog  meal coverage 2-3 units tid with meals (hold if patient eats less than 50% or NPO).   Thanks,  Randall Bullocks, RN, BC-ADM Inpatient Diabetes Coordinator Pager [phone]  (8a-5p)

## 2024-03-24 NOTE — Discharge Summary (Signed)
 Physician Discharge Summary   Patient: [name] MRN: [id number] DOB: [birth year]  Admit date:     03/22/2024  Discharge date: 03/24/24  Discharge Physician: Leita Blanch   PCP: Kennyth Chew, MD   Recommendations at discharge:   resume your hemodialysis on your routine as before F/u PCP in 1-2 weeks  Discharge Diagnoses: Principal Problem:   Polyarthritis Active Problems:   ESRD on dialysis [facility])   CHF (congestive heart failure) (HCC)  [name] is a 58 y.o. female with medical history significant of ESRD on HD MWF, chronic HFrEF with LVEF 40-45%, IDDM, peripheral neuropathy, HTN, HLD, CVA, coming back to us  with show for evaluation of worsening of multiple joint pain and swelling.   Patient was just released from hospital yesterday after hospital stay for treatment of multiple joint pain and swelling.  Symptoms started 4 weeks ago with increasing shortness of breath and peripheral swelling in multiple joints including bilateral ankles bilateral feet and bilateral wrist.    Polyarthralgia/DJD -- patient started on IV Solu-Medrol  feels a lot better. Change to PO prednisone and continue empiric colchicine  for elevated uric acid which has normalized -- Imaging studies don't show any joint destruction. Suggestive osteopenia. -- CRP elevated -- PRN pain meds -- patient recommended to follow-up with PCP and consider rheumatology consultation if symptoms recur --Prednisone for next 3-4 days along with colchicine    End-stage renal disease on hemodialysis -- resumed HD   History of congestive heart failure systolic H/o PPM -- patient does not appear to be volume overloaded. She does have a bilateral lower extremity edema which is chronic more on the left --Echo EF 40-45% -- wean to room air. 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 Requesting to go home.       Procedures:inpt routine HD Family communication : no family at bedside Consults :  nephrology CODE STATUS: full DVT Prophylaxis : heparin       Pain control - [place]  Controlled Substance Reporting System database was reviewed. and patient was instructed, not to drive, operate heavy machinery, perform activities at heights, swimming or participation in water  activities or provide baby-sitting services while on Pain, Sleep and Anxiety Medications; until their outpatient Physician has advised to do so again. Also recommended to not to take more than prescribed Pain, Sleep and Anxiety Medications.  Consultants: nephrology Procedures performed: HD  Disposition: Home Diet recommendation:  Discharge Diet Orders (From admission, onward)     Start     Ordered   03/24/24 0000  Diet - low sodium heart healthy        03/24/24 1149           Renal diet DISCHARGE MEDICATION: Allergies as of 03/24/2024       Reactions   Enalapril Hives, Other (See Comments)   Angioedema face.   Ivp Dye [iodinated Contrast Media] Hives   Lisinopril Shortness Of Breath        Medication List     TAKE these medications    albuterol  108 (90 Base) MCG/ACT inhaler Commonly known as: VENTOLIN  HFA Inhale 2 puffs into the lungs every 6 (six) hours as needed for wheezing or shortness of breath.   colchicine  0.6 MG tablet Take 1 tablet (0.6 mg total) by mouth daily for 5 days.   NovoLOG  FlexPen 100 UNIT/ML FlexPen Generic drug: insulin  aspart Inject 14 Units into the skin  3 (three) times daily with meals.   oxyCODONE  5 MG immediate release tablet Commonly known as: Oxy IR/ROXICODONE  Take 1 tablet (5 mg total) by mouth 2 (two) times daily as needed for moderate pain (pain score 4-6).   predniSONE 20 MG tablet Commonly known as: DELTASONE Take 1 tablet (20 mg total) by mouth daily with breakfast for 3 days. Start taking on: March 25, 2024   sevelamer  carbonate 800 MG tablet Commonly known  as: RENVELA  Take 2,400 mg by mouth 3 (three) times daily with meals.   Vitamin D  (Ergocalciferol ) 1.25 MG (50000 UNIT) Caps capsule Commonly known as: DRISDOL  Take 1 capsule (50,000 Units total) by mouth every 7 (seven) days. What changed: additional instructions        Follow-up Information     Kennyth Chew, MD. Schedule an appointment as soon as possible for a visit in 1 week(s).   Specialties: Cardiology, Radiology Contact information: [address] [place] [address] [phone]                Discharge Exam: Fredricka Weights   03/22/24 1256 03/22/24 1702 03/24/24 0758  Weight: 84.5 kg 84.4 kg 84.5 kg   Mild right hand and left ankle swelling+ no  redness. Able to make fist 1/2 way on right hand  Condition at discharge: fair  The results of significant diagnostics from this hospitalization (including imaging, microbiology, ancillary and laboratory) are listed below for reference.   Imaging Studies: DG Chest 1 View Result Date: 03/22/2024 CLINICAL DATA:  CHF. EXAM: CHEST  1 VIEW COMPARISON:  03/19/2024. FINDINGS: Low lung volumes. Stable cardiomegaly. Stable right IJ CVC catheter tip overlies the right atrium. Slightly decreased bilateral perihilar interstitial prominence. No focal consolidation, sizeable pleural effusion, or pneumothorax. The visualized osseous structures are unchanged. IMPRESSION: Low lung volumes. Stable cardiomegaly with slightly decreased bilateral perihilar interstitial prominence, favored to reflect perihilar edema. Electronically Signed   By: Harrietta Sherry M.D.   On: 03/22/2024 09:23   DG Ankle Complete Left Result Date: 03/22/2024 EXAM: 3 or more VIEW(S) XRAY OF THE LEFT ANKLE 03/22/2024 05:13:09 AM CLINICAL HISTORY: Left ankle pain and swelling with no injury. COMPARISON: None available. FINDINGS: BONES AND JOINTS: No acute fracture. No focal osseous lesion. No joint dislocation. Spurring is present at the achilles tendon  insertion. SOFT TISSUES: Diffuse soft tissue swelling is present about the ankle. Microvascular calcifications are present. IMPRESSION: 1. Diffuse soft tissue swelling about the ankle. 2. No acute osseous abnormality. Electronically signed by: Lonni Necessary MD 03/22/2024 05:45 AM EDT RP Workstation: [id number]   DG Wrist 2 Views Right Result Date: 03/21/2024 CLINICAL DATA:  Right upper extremity pain. EXAM: RIGHT WRIST - 2 VIEW; RIGHT HAND - 2 VIEW COMPARISON:  None Available. FINDINGS: No acute fracture or dislocation. The bones are osteopenic. The soft tissues are unremarkable. Vascular calcifications noted. IMPRESSION: 1. No acute fracture or dislocation. 2. Osteopenia. Electronically Signed   By: Vanetta Chou M.D.   On: 03/21/2024 13:15   DG Hand 2 View Right Result Date: 03/21/2024 CLINICAL DATA:  Right upper extremity pain. EXAM: RIGHT WRIST - 2 VIEW; RIGHT HAND - 2 VIEW COMPARISON:  None Available. FINDINGS: No acute fracture or dislocation. The bones are osteopenic. The soft tissues are unremarkable. Vascular calcifications noted. IMPRESSION: 1. No acute fracture or dislocation. 2. Osteopenia. Electronically Signed   By: Vanetta Chou M.D.   On: 03/21/2024 13:15   DG Chest 1 View Result Date: 03/19/2024 EXAM: 1 VIEW XRAY OF THE CHEST  03/19/2024 11:07:00 PM COMPARISON: 03/10/2024 CXR CLINICAL HISTORY: SOB (shortness of breath) 141880. Arrived pov for shob x1 week; ; Per pt I have low iron  and it makes me have shob when it's low. I received some iron  on Friday, but it has worn off and I'm feeling shortness of breath again.; ; Denies CP FINDINGS: LUNGS AND PLEURA: Suspected mild perihilar edema. No focal pulmonary opacity. No pleural effusion. No pneumothorax. HEART AND MEDIASTINUM: Cardiomegaly. No acute abnormality of the cardiac and mediastinal silhouettes. BONES AND SOFT TISSUES: No acute osseous abnormality. Right IJ approach dual lumen central venous catheter in place with tip  projecting at the right atrium. Implantable loop recorder noted. IMPRESSION: 1. Cardiomegaly and suspected mild perihilar edema. Electronically signed by: Pinkie Pebbles MD 03/19/2024 11:13 PM EDT RP Workstation: [id number]   DG Chest 2 View Result Date: 03/10/2024 CLINICAL DATA:  [id number].  Leadless pacemaker in place. EXAM: CHEST - 2 VIEW COMPARISON:  Portable chest 03/02/2024 FINDINGS: A leadless pacemaker has been inserted and is located in the anterior aspect of the right ventricle. Right IJ dialysis catheter again terminates in the upper right atrium. No pneumothorax. The lungs are generally clear with mild hypoinflation. The heart is enlarged with normal caliber central vessel markings. The mediastinum is normally outlined. There is a aortic atherosclerosis. No new osseous finding. IMPRESSION: 1. Leadless pacemaker inserted without evidence of pneumothorax. 2. Cardiomegaly. 3. Aortic atherosclerosis. Electronically Signed   By: Francis Quam M.D.   On: 03/10/2024 07:55   EP PPM/ICD IMPLANT Result Date: 03/09/2024 Table formatting from the original result was not included.  SURGEON:  Eulas Furbish, MD    PREPROCEDURE DIAGNOSES:  1. Irreversible symptomatic bradycardia due to 2:1 heart block    POSTPROCEDURE DIAGNOSES:  1. Irreversible symptomatic bradycardia due to 2:1 heart block    PROCEDURES:   1. Leadless pacemaker placement    INTRODUCTION: The patient is a 57yo woman with ESRD admitted with complete heart block who presents to the EP lab for placement of leadless permanent pacemaker.    DESCRIPTION OF PROCEDURE:  Informed written consent was obtained and the patient was brought to the electrophysiology lab in the fasting state. The patient was placed under general anesthesia by the anesthesiology service.  The patient's bilateral groins were prepped and draped in the usual sterile fashion by the EP lab staff.   Using ultrasound guidance, the right femoral vein was accessed by modified Seldinger  technique. An amplatzer superstiff wire was advanced to the SVC. The RFV was dilated serially.  The [employer] Micra access sheath advanced to the IVC RA junction.  Device placement. The device was advanced to the IVC RA junction. The device was then advanced to the RV and a good distal, septal position obtained. Positioning was confirmed with RAO and LAO views, and a contrast injection performed in RAO to confirm space at the anterior interventricular groove. The device was deployed. Sensing, capture, and impedence were assessed. Good fixation was confirmed with deflection of 3 tines. The device was flushed, and the tether clipped and removed.     RV Model Micra AV Serial # [id number] E Amplitude 6.5 mV Threshold 0.8 V @ 0.24 mS Impedence 500 ohms   Hemostasis was obtained with 2 Perclose devices a figure-of-eight suture secured with a stopcock.    CONCLUSIONS:  1. Successful placement of a ventricular leadless pacemakers -- Medtronic Micra AV  3.  No early apparent complications.   Eulas Furbish, MD Cardiac Electrophysiology     ECHOCARDIOGRAM COMPLETE  Result Date: 03/03/2024    ECHOCARDIOGRAM REPORT   Patient Name:   FAREEDAH MAHLER Date of Exam: 03/03/2024 Medical Rec #:  [id number]    Height:       67.0 in Accession #:    [id number]   Weight:       188.1 lb Date of Birth:  1966/08/16    BSA:          1.970 m Patient Age:    57 years     BP:           139/69 mmHg Patient Gender: F            HR:           49 bpm. Exam Location:  Inpatient Procedure: 2D Echo, Strain Analysis, Cardiac Doppler and Color Doppler (Both            Spectral and Color Flow Doppler were utilized during procedure). 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                 Arrythmias:Bradycardia, Signs/Symptoms:Shortness of Breath; Risk                 Factors:Hypertension, Diabetes and Dyslipidemia.  Sonographer:    Koleen Popper RDCS Referring Phys: [id number]  JONATHAN SEGARS  Sonographer Comments: Global longitudinal strain was attempted. IMPRESSIONS  1. Left ventricular ejection fraction, by estimation, is 40 to 45%. The left ventricle has mildly decreased function. The left ventricle demonstrates regional wall motion abnormalities (see scoring diagram/findings for description). The left ventricular  internal cavity size was moderately dilated. Left ventricular diastolic function could not be evaluated. Elevated left atrial pressure. The E/e' is 17.  2. Right ventricular systolic function is low normal. The right ventricular size is normal. increased. A catheter within the right atrium is visualized. There is severely elevated pulmonary artery systolic pressure.  3. The mitral valve is degenerative. Severe mitral valve regurgitation. Mild mitral valve stenosis (MG , MVA (VTI) 1.07cm2, HR 46bpm, RVSP ). Accuracy limited due to complete heart block. mitral stenosis. Severe mitral annular calcification.  4. Tricuspid valve regurgitation is severe.  5. The aortic valve is normal in structure. Aortic valve regurgitation is not visualized. Aortic valve sclerosis with mild stenosis (peak velocity 2.74m/s, MG , AVA VTI 1.33cm2, DI 0.47). LCC is not very mobile.  6. Pulmonic valve regurgitation is moderate.  7. The inferior vena cava is dilated in size with <50% respiratory variability, suggesting right atrial pressure of 15 mmHg. Comparison(s): No prior Echocardiogram. FINDINGS  Left Ventricle: Left ventricular ejection fraction, by estimation, is 40 to 45%. The left ventricle has mildly decreased function. The left ventricle demonstrates regional wall motion abnormalities. Global longitudinal strain performed but not reported based on interpreter judgement due to suboptimal tracking. The left ventricular internal cavity size was moderately dilated. There is no left ventricular hypertrophy. Left ventricular diastolic function could not be evaluated due to mitral  annular calcification (moderate or greater). Left ventricular diastolic function could not be evaluated. Elevated left atrial pressure. The E/e' is 68.  LV Wall Scoring: The entire septum and mid inferior segment are hypokinetic. Right Ventricle: The right ventricular size is normal. Increased. Right ventricular systolic function is low normal. There is severely elevated pulmonary artery systolic pressure. The tricuspid regurgitant velocity is 3.63 m/s, and with an assumed right atrial pressure of 15 mmHg, the estimated right  ventricular systolic pressure is 67.7 mmHg. Left Atrium: Left atrial size was normal in size. Right Atrium: Right atrial size was normal in size. Pericardium: There is no evidence of pericardial effusion. Mitral Valve: The mitral valve is degenerative in appearance. There is mild thickening of the mitral valve leaflet(s). There is mild calcification of the mitral valve leaflet(s). Mildly decreased mobility of the mitral valve leaflets. Severe mitral annular calcification. Severe mitral valve regurgitation. Mild mitral valve stenosis (MG , MVA (VTI) 1.07cm2, HR 46bpm, RVSP ). Accuracy limited due to complete heart block. mitral valve stenosis. MV peak gradient, 11.7 mmHg. The mean mitral valve gradient is 3.0 mmHg. Tricuspid Valve: The tricuspid valve is grossly normal. Tricuspid valve regurgitation is severe. No evidence of tricuspid stenosis. The flow in the hepatic veins is reversed during ventricular systole. Aortic Valve: The aortic valve is normal in structure. Aortic valve regurgitation is not visualized. Aortic valve sclerosis with mild stenosis (peak velocity 2.44m/s, MG , AVA VTI 1.33cm2, DI 0.47). LCC is not very mobile. Aortic valve mean gradient  measures 10.5 mmHg. Aortic valve peak gradient measures 27.8 mmHg. Aortic valve area, by VTI measures 1.33 cm. Pulmonic Valve: The pulmonic valve was grossly normal. Pulmonic valve regurgitation is moderate. No evidence of  pulmonic stenosis. Aorta: The aortic root and ascending aorta are structurally normal, with no evidence of dilitation. Venous: The inferior vena cava is dilated in size with less than 50% respiratory variability, suggesting right atrial pressure of 15 mmHg. IAS/Shunts: The atrial septum is grossly normal. Additional Comments: A catheter within the right atrium is visualized.  LEFT VENTRICLE PLAX 2D LVIDd:         5.80 cm   Diastology LVIDs:         4.40 cm   LV e' medial:    7.83 cm/s LV PW:         1.00 cm   LV E/e' medial:  18.4 LV IVS:        0.90 cm   LV e' lateral:   8.81 cm/s LVOT diam:     1.90 cm   LV E/e' lateral: 16.3 LV SV:         57 LV SV Index:   29 LVOT Area:     2.84 cm  RIGHT VENTRICLE            IVC RV Basal diam:  4.30 cm    IVC diam: 2.20 cm RV Mid diam:    3.90 cm RV S prime:     8.70 cm/s TAPSE (M-mode): 1.8 cm LEFT ATRIUM             Index        RIGHT ATRIUM           Index LA diam:        5.20 cm 2.64 cm/m   RA Area:     17.70 cm LA Vol (A2C):   61.0 ml 30.96 ml/m  RA Volume:   50.20 ml  25.48 ml/m LA Vol (A4C):   61.5 ml 31.22 ml/m LA Biplane Vol: 61.3 ml 31.12 ml/m  AORTIC VALVE AV Area (Vmax):    1.26 cm AV Area (Vmean):   1.28 cm AV Area (VTI):     1.33 cm AV Vmax:           263.75 cm/s AV Vmean:          148.000 cm/s AV VTI:            0.428  m AV Peak Grad:      27.8 mmHg AV Mean Grad:      10.5 mmHg LVOT Vmax:         117.00 cm/s LVOT Vmean:        66.900 cm/s LVOT VTI:          0.201 m LVOT/AV VTI ratio: 0.47  AORTA Ao Root diam: 2.90 cm Ao Asc diam:  3.20 cm MITRAL VALVE                TRICUSPID VALVE MV Area (PHT): 2.95 cm     TR Peak grad:   52.7 mmHg MV Area VTI:   1.07 cm     TR Vmax:        363.00 cm/s MV Peak grad:  11.7 mmHg MV Mean grad:  3.0 mmHg     SHUNTS MV Vmax:       1.71 m/s     Systemic VTI:  0.20 m MV Vmean:      70.1 cm/s    Systemic Diam: 1.90 cm MV Decel Time: 257 msec MV E velocity: 144.00 cm/s MV A velocity: 82.60 cm/s MV E/A ratio:  1.74 Sunit Tolia  Electronically signed by Madonna Large Signature Date/Time: 03/03/2024/1:50:34 PM    Final    DG Chest Port 1 View Result Date: 03/02/2024 CLINICAL DATA:  Chest pain. EXAM: PORTABLE CHEST 1 VIEW COMPARISON:  02/20/2024. FINDINGS: Low lung volumes, similar to the prior exam. Defibrillator pad overlies the left chest wall. Stable cardiomegaly. Right sided CVC catheter tip overlies the superior cavoatrial junction, unchanged. No focal consolidation, pleural effusion, or pneumothorax. No acute osseous abnormality. IMPRESSION: 1. Low lung volumes.  No acute cardiopulmonary findings. 2. Stable cardiomegaly. Electronically Signed   By: Harrietta Sherry M.D.   On: 03/02/2024 14:03    Microbiology: Results for orders placed or performed during the hospital encounter of 12/14/21  Surgical pcr screen     Status: None   Collection Time: 12/14/21 12:17 AM   Specimen: Nasal Mucosa; Nasal Swab  Result Value Ref Range Status   MRSA, PCR NEGATIVE NEGATIVE Final   Staphylococcus aureus NEGATIVE NEGATIVE Final    Comment: (NOTE) The Xpert SA Assay (FDA approved for NASAL specimens in patients 50 years of age and older), is one component of a comprehensive surveillance program. It is not intended to diagnose infection nor to guide or monitor treatment. Performed at [facility], [address] [place], [place] [address]     Labs: CBC: Recent Labs  Lab 03/19/24 2233 03/20/24 0603 03/22/24 0503 03/24/24 0741  WBC 7.0 7.3 9.8 13.0*  NEUTROABS  --   --  8.4*  --   HGB 10.9* 10.6* 11.6* 10.8*  HCT 35.8* 34.6* 37.6 34.8*  MCV 103.5* 104.8* 101.9* 103.6*  PLT 203 209 159 180   Basic Metabolic Panel: Recent Labs  Lab 03/20/24 0603 03/21/24 0521 03/22/24 0503 03/23/24 0642 03/24/24 0741  NA 136 132* 133* 132* 134*  K 4.4 4.1 4.6 5.3* 5.2*  CL 91* 92* 92* 93* 92*  CO2 25 23 22 24 23   GLUCOSE 216* 188* 199* 332* 329*  BUN 52* 32* 48* 30* 50*  CREATININE 8.94* 6.32* 8.17* 5.19* 7.12*   CALCIUM  9.2 9.4 9.5 8.9 9.2  PHOS  --   --   --   --  6.4*   Liver Function Tests: Recent Labs  Lab 03/24/24 0741  ALBUMIN 3.5   CBG: Recent Labs  Lab 03/23/24 0730 03/23/24  1108 03/23/24 1733 03/23/24 2143 03/24/24 0206  GLUCAP 344* 368* 321* 435* 290*    Discharge time spent: greater than 30 minutes.  Signed: Leita Blanch, MD [facility] 03/24/2024

## 2024-03-24 NOTE — ED Notes (Signed)
 Called to the patient room to discussed the shared room. Pt expressed her concerns and therapeutic communication provided to both the patient and S/O. This RN reached out the [facility] Cathy) to see what could be resolved on our end for the patient to be able to remain an admission but not to proceed with the semi-private room. Bed retracted - re-eval during day shift. Primary RN Denzel) and On-call provider Sheliah Cone, NP) made aware.

## 2024-03-25 ENCOUNTER — Telehealth: Payer: Self-pay | Admitting: Physician Assistant

## 2024-03-25 NOTE — Telephone Encounter (Signed)
   The patient called the answering service after-hours today. She had a leadless PPM placed 7/10. She reports her HR usually runs in the 80s but today jumping around 99-1teens. Aside from feeling some palpitations, no acute symptoms. No CP, SOB, or any concerns. She was readmitted 7/20-7/22 with SOB/pulm edema (adherent with HD) and then again 7/23-7/25 with polyarthralgia in multiple joints started on steroids. She has sent in a transmission. I reached out to [employer] rep Donley to help evaluate. He reports that there are no acute device issues, Micra appears to be tracking >96% of the time. Given the type of pacemaker, however, diagnostics are limited and unable to determine the atrial rhythm. I discussed case with Dr. Nancey who reviewed her EKG from 03/19/24 as well - he felt this represented sinus rhythm with poor AV tracking and she will need to come in to clinic next week with the [employer] team to optimize her micra AV. Will send msg to device clinic to help arrange.  In the meantime I relayed to [name] that there is not enough information on her device to tell us  what underlying rhythm is. Per my discussion with Dr. Nancey it could certainly reflect sinus tach in setting of recent steroid placement, but we do not have sufficient information to say definitively, and she was invited to return to ER if she is symptomatic, otherwise, will send msg to device clinic for outpatient EP follow-up as above.  Elizabeth Parlee N [name], PA-C

## 2024-03-27 NOTE — Telephone Encounter (Signed)
 Need to clarify with Dr. Nancey.  After this conversation with Dayna, Dr.Mealor reviewed further in detail with Donley, [employer] rep.  Donley indicated that device was working and tracking appropriately, did not feel need to bring patient in unless Dr. Nancey still wants to do so.   Will forward to Dr. Nancey to ensure proper follow up occurs.

## 2024-03-27 NOTE — Telephone Encounter (Signed)
 Spoke with patient and [employer] rep Donley.  She will meet [employer] here in device clinic on 03/29/24 at 1pm.  Both parties verbalize understanding and agreement.

## 2024-03-27 NOTE — Telephone Encounter (Signed)
 Noted, will reach out to patient and coordinate with [employer] the soonest they can come in to assess and address this with patient.     LM for patient to call us  back.

## 2024-03-29 ENCOUNTER — Ambulatory Visit: Attending: Cardiology

## 2024-03-29 DIAGNOSIS — I442 Atrioventricular block, complete: Secondary | ICD-10-CM

## 2024-04-06 NOTE — Progress Notes (Signed)
 Patient brought in to assess Micra device function as recent EKG showed dyssynchrony.  Ivan with [employer] performed interrogation and reprogramming today:   PROGRAMMING CHANGES:  Shortened blanking period Lowered A4 threshold Changed sensing vector to (1+2+3)  Changes made were to promote tracking of the A4 signal and minimize tracking of the A3 signal.  Confirmed synchrony with EKG post programming changes.  Donley reviewed and confirmed with Dr. Nancey in clinic before releasing patient.

## 2024-04-07 ENCOUNTER — Other Ambulatory Visit: Payer: Self-pay | Admitting: Pulmonary Disease

## 2024-04-07 DIAGNOSIS — R0600 Dyspnea, unspecified: Secondary | ICD-10-CM

## 2024-04-07 DIAGNOSIS — Z801 Family history of malignant neoplasm of trachea, bronchus and lung: Secondary | ICD-10-CM

## 2024-04-07 DIAGNOSIS — N186 End stage renal disease: Secondary | ICD-10-CM

## 2024-04-07 DIAGNOSIS — R918 Other nonspecific abnormal finding of lung field: Secondary | ICD-10-CM

## 2024-04-11 ENCOUNTER — Ambulatory Visit
Admission: RE | Admit: 2024-04-11 | Discharge: 2024-04-11 | Disposition: A | Discharge status: Home / Self Care | Source: Ambulatory Visit | Attending: Pulmonary Disease | Admitting: Pulmonary Disease

## 2024-04-11 ENCOUNTER — Ambulatory Visit
Admission: RE | Admit: 2024-04-11 | Discharge: 2024-04-11 | Disposition: A | Discharge status: Home / Self Care | Source: Ambulatory Visit | Attending: Pulmonary Disease

## 2024-04-11 DIAGNOSIS — Z992 Dependence on renal dialysis: Secondary | ICD-10-CM | POA: Insufficient documentation

## 2024-04-11 DIAGNOSIS — R918 Other nonspecific abnormal finding of lung field: Secondary | ICD-10-CM | POA: Insufficient documentation

## 2024-04-11 DIAGNOSIS — N186 End stage renal disease: Secondary | ICD-10-CM | POA: Diagnosis present

## 2024-04-11 DIAGNOSIS — R0689 Other abnormalities of breathing: Secondary | ICD-10-CM | POA: Insufficient documentation

## 2024-04-11 DIAGNOSIS — Z801 Family history of malignant neoplasm of trachea, bronchus and lung: Secondary | ICD-10-CM | POA: Diagnosis present

## 2024-04-11 DIAGNOSIS — R0600 Dyspnea, unspecified: Secondary | ICD-10-CM | POA: Diagnosis present

## 2024-04-11 MED ORDER — IOHEXOL 350 MG/ML SOLN
75.0000 mL | Freq: Once | INTRAVENOUS | Status: AC | PRN
  Administered 2024-04-11 (×2): 75 mL via INTRAVENOUS

## 2024-04-14 NOTE — Progress Notes (Signed)
 DIVISION OF PULMONARY AND CRITICAL CARE MEDICINE                              FOLLOW UP ENCOUNTER     Chief complaint: Dyspnea on exertion   History of Present Illness [name] is a 58 year old female with recent pacemaker insertion who presents with shortness of breath.  She has been experiencing significant shortness of breath for approximately eleven weeks following an episode of pneumonia. The dyspnea is severe and occurs with minimal physical activity, such as brushing her teeth or getting up from a chair. She becomes 'out of breath to the point of panicking' and frequently uses albuterol  to manage her symptoms, although she feels she has 'abused' it due to the frequency of use. She cannot lay back in bed without experiencing worsening shortness of breath, which leads to increased use of albuterol . Despite having a pacemaker inserted three weeks ago to address a low heart rate, she has not noticed any improvement in her breathing. No dizziness, lightheadedness, or chest discomfort, but she emphasizes the panic associated with her inability to breathe.  Her past medical history includes dialysis for renal failure, and she has a central line in place for this purpose. She has not taken steroids regularly but recalls that prednisone  helped reduce swelling in her hands and feet during a previous hospital stay.  Her family history includes parents who were smokers, although she does not smoke herself. She reports that her nurse practitioner mentioned the possibility of COPD, and she is unsure if she has asthma or COPD. She also states that she had a CT scan and saw that there was a little bit of fluid on the outside of her lung, but she is unsure of its significance.   Past Medical History:   Past Medical History:  Diagnosis Date  . Anemia   . Anxiety   . GERD (gastroesophageal reflux disease)   . High serum parathyroid hormone (PTH)    checked through  Dialysis  . History of kidney stones 2000  . Hypertension   . kidney failure   . Neuromuscular disorder (CMS/HHS-HCC)    neuropathy in feet  . PONV (postoperative nausea and vomiting)    severe nausea requiring many doses of post op antiemetics  . Type 2 diabetes mellitus (CMS/HHS-HCC)     Past Surgical History:   Past Surgical History:  Procedure Laterality Date  . ARTHROSCOPIC ROTATOR CUFF REPAIR Right 2004  . AV FISTULA INSERTION W/ RF MAGNETIC GUIDANCE  12/08/2017  . UPPER EXTREMITY VENOGRAPHY  02/15/2018  . Left Hip Anterior Hip Hemiarthroplasty Left 09/29/2021   [facility] - Dr Leora  . DIALYSIS/PERMA CATHETER INSERTION  10/03/2021  . Right quadriceps repair Right 10/13/2021   Dr. Massie Bertrand  . Left hip wound irrigation, debridement and culture with application of Prevena wound VAC Left 11/15/2021   [facility] - Dr. Marchia  . Right hip hemiarthroplasty Right 12/14/2021   Dr. Tobie  . OTHER SURGERY  03/09/2024   Pacemaker Implant  . ABDOMINAL HYSTERECTOMY      Allergies:   Allergies  Allergen Reactions  . Amoxicillin Diarrhea  . Enalapril Other (See Comments) and Hives    Angioedema face. Angioedema face. Angioedema face. Angioedema face.   SABRA  Lisinopril Shortness Of Breath  . Shellfish Containing Products Swelling    patient tolerates shellfish by mouth without problem patient tolerates shellfish by mouth without problem   . Iodinated Contrast Media Hives    Current Medications:   Prior to Admission medications  Medication Sig Taking? Last Dose  BD ULTRA-FINE MICRO PEN NEEDLE 32 gauge x 1/4 needle 3 (three) times daily Yes Taking  blood-glucose meter,continuous (DEXCOM G6 RECEIVER) Misc Use 1 each as directed Yes Taking  blood-glucose sensor (DEXCOM G6 SENSOR) Devi Use 1 Device every 10 (ten) days Yes Taking  blood-glucose transmitter (DEXCOM G6 TRANSMITTER) Devi Use 1 each every 3 (three) months Yes Taking  ergocalciferol , vitamin D2, 1,250  mcg (50,000 unit) capsule Take 50,000 Units by mouth every 7 (seven) days Yes Taking  ipratropium-albuteroL  (DUO-NEB) nebulizer solution Take 3 mLs by nebulization 4 (four) times daily for 360 days Yes PRN Not Currently Taking  NOVOLOG  FLEXPEN U-100 INSULIN  pen injector (concentration 100 units/mL) Take 10 units subcutaneously three times daily before meals plus sliding scale:Take 2 units of NovoLog  if your sugar is 150-200, with increase of 2 units for every 50 increase blood sugar over 200, max 70 units daily Patient taking differently: Inject 14 Units subcutaneously 3 (three) times daily with meals Take 10 units subcutaneously three times daily before meals plus sliding scale:Take 2 units of NovoLog  if your sugar is 150-200, with increase of 2 units for every 50 increase blood sugar over 200, max 70 units daily Yes Taking  predniSONE  (DELTASONE ) 10 MG tablet Take 4 tablets (40 mg total) by mouth once daily for 1 day, THEN 3 tablets (30 mg total) once daily for 2 days, THEN 2 tablets (20 mg total) once daily for 2 days, THEN 1 tablet (10 mg total) once daily for 2 days, THEN 0.5 tablets (5 mg total) once daily for 2 days. Yes PRN Not Currently Taking  amLODIPine  (NORVASC ) 5 MG tablet Take 5 mg by mouth 2 (two) times daily Patient not taking: Reported on 04/13/2024  Not Taking  AURYXIA  210 mg iron  tablet TAKE 2 TABLETS BY MOUTH WITH MEAL AND 1 TABLET WITH SNACK Patient not taking: Reported on 04/13/2024  Not Taking  azithromycin  (ZITHROMAX ) 250 MG tablet Take 1 tablet (250 mg total) by mouth once daily for 20 days    blood glucose diagnostic test strip 1 each (1 strip total) 3 (three) times daily Use as instructed.    blood glucose meter kit as directed    dexAMETHasone  (DECADRON ) 4 MG tablet Take 1 tablet (4 mg total) by mouth daily with breakfast for 10 days, THEN 0.5 tablets (2 mg total) daily with breakfast for 10 days.    fluticasone-umeclidinium-vilanterol (TRELEGY ELLIPTA) 200-62.5-25 mcg inhaler  Inhale 1 Puff into the lungs once daily    insulin  GLARGINE (BASAGLAR  KWIKPEN U-100 INSULIN ) pen injector (concentration 100 units/mL) Inject 15 Units subcutaneously nightly Patient not taking: Reported on 04/06/2024    lancing device with lancets kit Use 1 each 3 (three) times daily Use as instructed.    losartan  (COZAAR ) 50 MG tablet Take 50 mg by mouth once daily Patient not taking: Reported on 04/13/2024  Not Taking  methocarbamoL  (ROBAXIN ) 750 MG tablet Take 1 tablet (750 mg total) by mouth 3 (three) times daily as needed Patient not taking: Reported on 04/13/2024  Not Taking    Family History:   Family History  Problem Relation Name Age of Onset  . COPD Mother    . Emphysema Mother    .  Cancer Father    . Eczema Sister    . High blood pressure (Hypertension) Brother    . Diabetes Brother    . Alcohol abuse Brother    . Cardiomyopathy (Abnormal function of the heart muscle) Brother      Social History:   Social History   Socioeconomic History  . Marital status: Married  Tobacco Use  . Smoking status: Never  . Smokeless tobacco: Never   Social Drivers of Corporate investment banker Strain: Low Risk  (04/06/2024)   Overall Financial Resource Strain (CARDIA)   . Difficulty of Paying Living Expenses: Not hard at all  Food Insecurity: No Food Insecurity (04/06/2024)   Hunger Vital Sign   . Worried About Programme researcher, broadcasting/film/video in the Last Year: Never true   . Ran Out of Food in the Last Year: Never true  Transportation Needs: No Transportation Needs (04/06/2024)   PRAPARE - Transportation   . Lack of Transportation (Medical): No   . Lack of Transportation (Non-Medical): No  Physical Activity: Unknown (01/03/2019)   Received from [facility]   Exercise Vital Sign   . On average, how many days per week do you engage in moderate to strenuous exercise (like a brisk walk)?: 3 days  Stress: No Stress Concern Present (03/07/2018)   Received from [facility]  [facility] - Occupational Stress Questionnaire   . Feeling of Stress : Not at all  Social Connections: Moderately Integrated (03/07/2018)   Received from [facility]   Social Connection and Isolation Panel   . Frequency of Communication with Friends and Family: More than three times a week   . Frequency of Social Gatherings with Friends and Family: More than three times a week   . Attends Religious Services: More than 4 times per year   . Active Member of Clubs or Organizations: No   . Attends Banker Meetings: Never   . Marital Status: Married  Housing Stability: Low Risk  (04/06/2024)   Housing Stability Vital Sign   . Unable to Pay for Housing in the Last Year: No   . Number of Times Moved in the Last Year: 0   . Homeless in the Last Year: No    Review of Systems:   A 10 point review of systems is negative, except for the pertinent positives and negatives detailed in the HPI.  Vitals:   Vitals:   04/13/24 1202  BP: 94/62  BP Location: Right upper arm  Patient Position: Sitting  BP Cuff Size: Adult  Pulse: 62  SpO2: 95%  Weight: 84 kg (185 lb 3 oz)  Height: 170.2 cm (5' 7)     Body mass index is 29 kg/m.  Physical Exam:   Physical Exam Vitals and nursing note reviewed.  Constitutional:      General: in no acute distress.    Appearance: Normal appearance. Is not ill-appearing, toxic-appearing or diaphoretic.  HENT:     Head: Normocephalic and atraumatic.     Right Ear: External ear normal.     Left Ear: External ear normal.  Eyes:     General:        Right eye: No discharge.        Left eye: No discharge.     Extraocular Movements: Extraocular movements intact.     Pupils: Pupils are equal, round, and reactive to light.  Cardiovascular:     Rate and Rhythm: Normal rate and regular  rhythm.     Pulses: Normal pulses.     Heart sounds: Normal heart sounds. No murmur heard.    No friction rub. No gallop.  Abdominal:     General: Bowel  sounds are normal.  Skin:    General: Skin is warm and dry.     Capillary Refill: Capillary refill takes less than 2 seconds.  Neurological:     Mental Status: Patient is alert.     Lab and Imaging Results:   Results LABS Allergy testing for asthma: Abnormal  RADIOLOGY Chest CT: Trace pleural effusion, clear lungs, no pneumonia, no chronic obstructive pulmonary disease (COPD), no emphysema, findings suggestive of asthma, upper abdomen reflux of contrast into hepatic veins consistent with elevated right heart pressure    Assessment and Plan:   Diagnoses and all orders for this visit:  SOB (shortness of breath) -     methylPREDNISolone  (PF) (SOLU-Medrol ) 40 mg/mL injection 20 mg  Pulmonary hypertension, moderate to severe (CMS/HHS-HCC) -     Ambulatory Referral to Cardiology -     methylPREDNISolone  (PF) (SOLU-Medrol ) 40 mg/mL injection 20 mg  Other orders -     dexAMETHasone  (DECADRON ) 4 MG tablet; Take 1 tablet (4 mg total) by mouth daily with breakfast for 10 days, THEN 0.5 tablets (2 mg total) daily with breakfast for 10 days. -     fluticasone-umeclidinium-vilanterol (TRELEGY ELLIPTA) 200-62.5-25 mcg inhaler; Inhale 1 Puff into the lungs once daily -     azithromycin  (ZITHROMAX ) 250 MG tablet; Take 1 tablet (250 mg total) by mouth once daily for 20 days    Assessment & Plan Shortness of breath under evaluation for possible asthma and pulmonary hypertension Shortness of breath exacerbated by physical activity and lying down. CT scan shows trace fluid, but lungs are clear, ruling out pneumonia and significant fluid overload. No evidence of COPD or emphysema. Possible asthma indicated by abnormal allergy testing and new onset of symptoms. Pulmonary hypertension considered due to elevated right heart pressure seen on CT scan, common in renal failure patients. Symptoms include shortness of breath on exertion, but no dizziness or chest discomfort reported. - Administer a steroid  shot for asthma. - Prescribe Trelegy inhaler for asthma management. - Prescribe Decadron  for inflammation and asthma. - Prescribe Zithromax  250 mg for 20 days as an anti-inflammatory antibiotic. - Refer to cardiology for evaluation of pulmonary hypertension.  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 Symptoms include shortness of breath on exertion, but no dizziness or chest discomfort reported. - Refer to cardiology for evaluation of pulmonary hypertension.     I spent a total of 41 minutes in both face-to-face and non-face-to-face activities, excluding procedures performed, for this visit on the date of this encounter.   This note has been created using dictation software tool and any typographical errors are purely unintentional.  Patient received an After Visit Summary

## 2024-04-25 ENCOUNTER — Encounter

## 2024-05-01 ENCOUNTER — Other Ambulatory Visit: Payer: Self-pay

## 2024-05-01 ENCOUNTER — Emergency Department

## 2024-05-01 ENCOUNTER — Inpatient Hospital Stay
Admission: EM | Admit: 2024-05-01 | Discharge: 2024-05-03 | DRG: 299 | Disposition: A | Discharge status: Home Health | Attending: Internal Medicine | Admitting: Internal Medicine

## 2024-05-01 DIAGNOSIS — Z992 Dependence on renal dialysis: Secondary | ICD-10-CM | POA: Diagnosis not present

## 2024-05-01 DIAGNOSIS — E876 Hypokalemia: Secondary | ICD-10-CM | POA: Diagnosis present

## 2024-05-01 DIAGNOSIS — E1065 Type 1 diabetes mellitus with hyperglycemia: Secondary | ICD-10-CM | POA: Diagnosis present

## 2024-05-01 DIAGNOSIS — M79604 Pain in right leg: Principal | ICD-10-CM

## 2024-05-01 DIAGNOSIS — M79671 Pain in right foot: Secondary | ICD-10-CM | POA: Diagnosis not present

## 2024-05-01 DIAGNOSIS — L950 Livedoid vasculitis: Secondary | ICD-10-CM | POA: Diagnosis present

## 2024-05-01 DIAGNOSIS — I38 Endocarditis, valve unspecified: Secondary | ICD-10-CM | POA: Diagnosis not present

## 2024-05-01 DIAGNOSIS — E10621 Type 1 diabetes mellitus with foot ulcer: Secondary | ICD-10-CM | POA: Diagnosis present

## 2024-05-01 DIAGNOSIS — Z833 Family history of diabetes mellitus: Secondary | ICD-10-CM

## 2024-05-01 DIAGNOSIS — E109 Type 1 diabetes mellitus without complications: Secondary | ICD-10-CM | POA: Diagnosis present

## 2024-05-01 DIAGNOSIS — N2581 Secondary hyperparathyroidism of renal origin: Secondary | ICD-10-CM | POA: Diagnosis present

## 2024-05-01 DIAGNOSIS — N186 End stage renal disease: Secondary | ICD-10-CM | POA: Diagnosis present

## 2024-05-01 DIAGNOSIS — E785 Hyperlipidemia, unspecified: Secondary | ICD-10-CM | POA: Diagnosis present

## 2024-05-01 DIAGNOSIS — K219 Gastro-esophageal reflux disease without esophagitis: Secondary | ICD-10-CM | POA: Diagnosis present

## 2024-05-01 DIAGNOSIS — I872 Venous insufficiency (chronic) (peripheral): Principal | ICD-10-CM | POA: Diagnosis present

## 2024-05-01 DIAGNOSIS — Z87442 Personal history of urinary calculi: Secondary | ICD-10-CM

## 2024-05-01 DIAGNOSIS — E1122 Type 2 diabetes mellitus with diabetic chronic kidney disease: Secondary | ICD-10-CM | POA: Diagnosis not present

## 2024-05-01 DIAGNOSIS — L959 Vasculitis limited to the skin, unspecified: Secondary | ICD-10-CM

## 2024-05-01 DIAGNOSIS — Z794 Long term (current) use of insulin: Secondary | ICD-10-CM | POA: Diagnosis not present

## 2024-05-01 DIAGNOSIS — M130 Polyarthritis, unspecified: Secondary | ICD-10-CM

## 2024-05-01 DIAGNOSIS — Z888 Allergy status to other drugs, medicaments and biological substances status: Secondary | ICD-10-CM

## 2024-05-01 DIAGNOSIS — I442 Atrioventricular block, complete: Secondary | ICD-10-CM | POA: Diagnosis present

## 2024-05-01 DIAGNOSIS — L97519 Non-pressure chronic ulcer of other part of right foot with unspecified severity: Secondary | ICD-10-CM | POA: Diagnosis present

## 2024-05-01 DIAGNOSIS — M7989 Other specified soft tissue disorders: Secondary | ICD-10-CM | POA: Diagnosis present

## 2024-05-01 DIAGNOSIS — Z90710 Acquired absence of both cervix and uterus: Secondary | ICD-10-CM

## 2024-05-01 DIAGNOSIS — M79672 Pain in left foot: Secondary | ICD-10-CM | POA: Diagnosis not present

## 2024-05-01 DIAGNOSIS — E1051 Type 1 diabetes mellitus with diabetic peripheral angiopathy without gangrene: Secondary | ICD-10-CM | POA: Diagnosis present

## 2024-05-01 DIAGNOSIS — Z91041 Radiographic dye allergy status: Secondary | ICD-10-CM

## 2024-05-01 DIAGNOSIS — I5022 Chronic systolic (congestive) heart failure: Secondary | ICD-10-CM | POA: Diagnosis present

## 2024-05-01 DIAGNOSIS — Z8249 Family history of ischemic heart disease and other diseases of the circulatory system: Secondary | ICD-10-CM | POA: Diagnosis not present

## 2024-05-01 DIAGNOSIS — E1022 Type 1 diabetes mellitus with diabetic chronic kidney disease: Secondary | ICD-10-CM | POA: Diagnosis present

## 2024-05-01 DIAGNOSIS — Z96643 Presence of artificial hip joint, bilateral: Secondary | ICD-10-CM | POA: Diagnosis present

## 2024-05-01 DIAGNOSIS — E1042 Type 1 diabetes mellitus with diabetic polyneuropathy: Secondary | ICD-10-CM | POA: Diagnosis present

## 2024-05-01 DIAGNOSIS — T380X5A Adverse effect of glucocorticoids and synthetic analogues, initial encounter: Secondary | ICD-10-CM | POA: Diagnosis present

## 2024-05-01 DIAGNOSIS — Z825 Family history of asthma and other chronic lower respiratory diseases: Secondary | ICD-10-CM

## 2024-05-01 DIAGNOSIS — M79605 Pain in left leg: Secondary | ICD-10-CM | POA: Diagnosis not present

## 2024-05-01 DIAGNOSIS — D631 Anemia in chronic kidney disease: Secondary | ICD-10-CM | POA: Diagnosis present

## 2024-05-01 DIAGNOSIS — I132 Hypertensive heart and chronic kidney disease with heart failure and with stage 5 chronic kidney disease, or end stage renal disease: Secondary | ICD-10-CM | POA: Diagnosis present

## 2024-05-01 DIAGNOSIS — Z79899 Other long term (current) drug therapy: Secondary | ICD-10-CM

## 2024-05-01 DIAGNOSIS — I272 Pulmonary hypertension, unspecified: Secondary | ICD-10-CM | POA: Diagnosis present

## 2024-05-01 DIAGNOSIS — S81809A Unspecified open wound, unspecified lower leg, initial encounter: Secondary | ICD-10-CM

## 2024-05-01 DIAGNOSIS — R230 Cyanosis: Secondary | ICD-10-CM | POA: Diagnosis not present

## 2024-05-01 DIAGNOSIS — I10 Essential (primary) hypertension: Secondary | ICD-10-CM | POA: Diagnosis present

## 2024-05-01 DIAGNOSIS — E559 Vitamin D deficiency, unspecified: Secondary | ICD-10-CM | POA: Diagnosis present

## 2024-05-01 DIAGNOSIS — I120 Hypertensive chronic kidney disease with stage 5 chronic kidney disease or end stage renal disease: Secondary | ICD-10-CM | POA: Diagnosis not present

## 2024-05-01 DIAGNOSIS — Z8673 Personal history of transient ischemic attack (TIA), and cerebral infarction without residual deficits: Secondary | ICD-10-CM

## 2024-05-01 DIAGNOSIS — I081 Rheumatic disorders of both mitral and tricuspid valves: Secondary | ICD-10-CM | POA: Diagnosis present

## 2024-05-01 DIAGNOSIS — Z950 Presence of cardiac pacemaker: Secondary | ICD-10-CM

## 2024-05-01 DIAGNOSIS — I739 Peripheral vascular disease, unspecified: Secondary | ICD-10-CM | POA: Diagnosis present

## 2024-05-01 LAB — CBC
HCT: 43.1 % (ref 36.0–46.0)
Hemoglobin: 13.9 g/dL (ref 12.0–15.0)
MCH: 32.4 pg (ref 26.0–34.0)
MCHC: 32.3 g/dL (ref 30.0–36.0)
MCV: 100.5 fL — ABNORMAL HIGH (ref 80.0–100.0)
Platelets: 148 K/uL — ABNORMAL LOW (ref 150–400)
RBC: 4.29 MIL/uL (ref 3.87–5.11)
RDW: 17.5 % — ABNORMAL HIGH (ref 11.5–15.5)
WBC: 7.5 K/uL (ref 4.0–10.5)
nRBC: 0 % (ref 0.0–0.2)

## 2024-05-01 LAB — PHOSPHORUS: Phosphorus: 3.3 mg/dL (ref 2.5–4.6)

## 2024-05-01 LAB — BASIC METABOLIC PANEL WITH GFR
Anion gap: 16 — ABNORMAL HIGH (ref 5–15)
BUN: 39 mg/dL — ABNORMAL HIGH (ref 6–20)
CO2: 29 mmol/L (ref 22–32)
Calcium: 7.7 mg/dL — ABNORMAL LOW (ref 8.9–10.3)
Chloride: 93 mmol/L — ABNORMAL LOW (ref 98–111)
Creatinine, Ser: 4.84 mg/dL — ABNORMAL HIGH (ref 0.44–1.00)
GFR, Estimated: 10 mL/min — ABNORMAL LOW (ref >60)
Glucose, Bld: 132 mg/dL — ABNORMAL HIGH (ref 70–99)
Potassium: 3.4 mmol/L — ABNORMAL LOW (ref 3.5–5.1)
Sodium: 138 mmol/L (ref 135–145)

## 2024-05-01 LAB — LACTIC ACID, PLASMA: Lactic Acid, Venous: 1.8 mmol/L (ref 0.5–1.9)

## 2024-05-01 LAB — SEDIMENTATION RATE: Sed Rate: 3 mm/h (ref 0–30)

## 2024-05-01 LAB — GLUCOSE, CAPILLARY: Glucose-Capillary: 271 mg/dL — ABNORMAL HIGH (ref 70–99)

## 2024-05-01 LAB — MAGNESIUM: Magnesium: 2 mg/dL (ref 1.7–2.4)

## 2024-05-01 MED ORDER — INSULIN ASPART 100 UNIT/ML IJ SOLN
0.0000 [IU] | Freq: Three times a day (TID) | INTRAMUSCULAR | Status: DC
  Administered 2024-05-02: 5 [IU] via SUBCUTANEOUS
  Administered 2024-05-02: 7 [IU] via SUBCUTANEOUS
  Filled 2024-05-01 (×3): qty 1

## 2024-05-01 MED ORDER — IOHEXOL 350 MG/ML SOLN
100.0000 mL | Freq: Once | INTRAVENOUS | Status: AC | PRN
  Administered 2024-05-01: 100 mL via INTRAVENOUS

## 2024-05-01 MED ORDER — INSULIN ASPART 100 UNIT/ML IJ SOLN
0.0000 [IU] | Freq: Every day | INTRAMUSCULAR | Status: DC
  Administered 2024-05-01 – 2024-05-02 (×2): 3 [IU] via SUBCUTANEOUS
  Filled 2024-05-01 (×2): qty 1

## 2024-05-01 MED ORDER — MORPHINE SULFATE (PF) 4 MG/ML IV SOLN
4.0000 mg | Freq: Once | INTRAVENOUS | Status: AC
  Administered 2024-05-01: 4 mg via INTRAVENOUS
  Filled 2024-05-01: qty 1

## 2024-05-01 MED ORDER — ONDANSETRON HCL 4 MG PO TABS: 4.0000 mg | ORAL_TABLET | Freq: Four times a day (QID) | ORAL | Status: DC | PRN

## 2024-05-01 MED ORDER — METHYLPREDNISOLONE SODIUM SUCC 40 MG IJ SOLR
40.0000 mg | Freq: Once | INTRAMUSCULAR | Status: AC
  Administered 2024-05-01: 40 mg via INTRAVENOUS
  Filled 2024-05-01: qty 1

## 2024-05-01 MED ORDER — ONDANSETRON HCL 4 MG/2ML IJ SOLN: 4.0000 mg | Freq: Four times a day (QID) | INTRAMUSCULAR | Status: DC | PRN

## 2024-05-01 MED ORDER — METHYLPREDNISOLONE SODIUM SUCC 40 MG IJ SOLR
40.0000 mg | Freq: Two times a day (BID) | INTRAMUSCULAR | Status: DC
  Administered 2024-05-01 – 2024-05-02 (×2): 40 mg via INTRAVENOUS
  Filled 2024-05-01 (×2): qty 1

## 2024-05-01 MED ORDER — HYDROMORPHONE HCL 1 MG/ML IJ SOLN
0.5000 mg | INTRAMUSCULAR | Status: DC | PRN
  Administered 2024-05-02: 0.5 mg via INTRAVENOUS
  Filled 2024-05-01: qty 0.5

## 2024-05-01 MED ORDER — DIPHENHYDRAMINE HCL 25 MG PO CAPS
50.0000 mg | ORAL_CAPSULE | Freq: Once | ORAL | Status: AC
  Administered 2024-05-01: 50 mg via ORAL
  Filled 2024-05-01: qty 2

## 2024-05-01 MED ORDER — FENTANYL CITRATE PF 50 MCG/ML IJ SOSY
50.0000 ug | PREFILLED_SYRINGE | Freq: Once | INTRAMUSCULAR | Status: AC
  Administered 2024-05-01: 50 ug via INTRAVENOUS
  Filled 2024-05-01: qty 1

## 2024-05-01 MED ORDER — DIPHENHYDRAMINE HCL 50 MG/ML IJ SOLN
50.0000 mg | Freq: Once | INTRAMUSCULAR | Status: AC
  Filled 2024-05-01: qty 1

## 2024-05-01 MED ORDER — HYDROMORPHONE HCL 1 MG/ML IJ SOLN
1.0000 mg | Freq: Once | INTRAMUSCULAR | Status: AC
  Administered 2024-05-01: 1 mg via INTRAVENOUS
  Filled 2024-05-01: qty 1

## 2024-05-01 MED ORDER — HEPARIN SODIUM (PORCINE) 5000 UNIT/ML IJ SOLN
5000.0000 [IU] | Freq: Three times a day (TID) | INTRAMUSCULAR | Status: DC
  Filled 2024-05-01 (×2): qty 1

## 2024-05-01 NOTE — ED Provider Notes (Signed)
 [facility] Provider Note    Event Date/Time   First MD Initiated Contact with Patient 05/01/24 1323     (approximate)   History   Leg Pain   HPI  [name] is a 58 y.o. female with a past medical history of end-stage renal disease on dialysis who presents today for evaluation of bilateral elbow right greater than left leg pain that is more and worsening over the past couple of weeks, with bilateral cold feet over the past couple of days and skin changes over the past 2 days.  She reports that the pain feels deep which she describes as bone pain.  She has had weeping from her lower extremities for the past couple of weeks.  She the past couple of weeks.  She notes that for the past 2 days her toes on her right feet have become purple and red and she has had significant pain that she reports is new today.  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   CHF (congestive heart failure) (HCC) 03/22/2024   Fluid overload 03/20/2024   Type 2 diabetes mellitus with end-stage renal disease (HCC) 03/20/2024   History of CVA (cerebrovascular accident) 03/20/2024   HFrEF (heart failure with reduced ejection fraction) (HCC) 03/20/2024   Mitral regurgitation 03/20/2024   Elevated troponin 03/03/2024   CHB (complete heart block) (HCC) 03/02/2024   SOB (shortness of breath) 03/02/2024   Leg edema 03/02/2024   Symptomatic bradycardia 03/02/2024   Complete heart block (HCC) 03/02/2024   Osteoporosis with current pathological fracture 12/14/2021   Fracture of proximal end of right femur, closed, initial encounter (HCC) 12/14/2021   Constipation 12/14/2021   History of fracture of left hip 11/15/2021   Non-healing surgical wound of left groin 11/15/2021   Wound infection 11/15/2021   Anemia of chronic disease 11/14/2021   ESRD on hemodialysis (HCC)    Acute on chronic anemia     Diabetic peripheral neuropathy (HCC)    Intertrochanteric fracture of left hip (HCC) 10/17/2021   Quadriceps tendon rupture 10/16/2021   Fracture, proximal femur, right, closed, initial encounter (HCC) 10/06/2021   Femur fracture, left (HCC) 09/28/2021   Essential hypertension 09/28/2021   Type 1 diabetes mellitus (HCC) 01/03/2019   Diabetic neuropathy (HCC) 09/27/2018   ESRD on dialysis (HCC) 09/27/2018   Secondary hyperparathyroidism of renal origin (HCC) 03/07/2018   Stage 5 chronic renal impairment associated with type 2 diabetes mellitus (HCC) 07/26/2017   Uncontrolled hypertension 07/26/2017   Hyperphosphatemia 09/29/2016   Metabolic acidosis 09/29/2016   Dyslipidemia 08/08/2015          Physical Exam   Triage Vital Signs: ED Triage Vitals  Encounter Vitals Group     BP 05/01/24 1304 112/68     Girls Systolic BP Percentile --      Girls Diastolic BP Percentile --      Boys Systolic BP Percentile --      Boys Diastolic BP Percentile --      Pulse Rate 05/01/24 1304 (!) 56     Resp 05/01/24 1304 18     Temp 05/01/24 1309 98.5 F (36.9 C)     Temp Source 05/01/24 1309 Oral     SpO2 05/01/24 1304 100 %     Weight --      Height --      Head Circumference --      Peak Flow --  Pain Score 05/01/24 1302 10     Pain Loc --      Pain Education --      Exclude from Growth Chart --     Most recent vital signs: Vitals:   05/01/24 1304 05/01/24 1309  BP: 112/68   Pulse: (!) 56   Resp: 18   Temp:  98.5 F (36.9 C)  SpO2: 100%     Physical Exam Vitals and nursing note reviewed.  Constitutional:      General: Awake and alert. No acute distress.    Appearance: Normal appearance.   HENT:     Head: Normocephalic and atraumatic.     Mouth: Mucous membranes are moist.  Eyes:     General: PERRL. Normal EOMs        Right eye: No discharge.        Left eye: No discharge.     Conjunctiva/sclera: Conjunctivae normal.  Cardiovascular:     Rate and Rhythm: Normal  rate and regular rhythm.     Pulses: Normal pulses.  Pulmonary:     Effort: Pulmonary effort is normal. No respiratory distress.     Breath sounds: Normal breath sounds.  Abdominal:     Abdomen is soft. There is no abdominal tenderness. No rebound or guarding. No distention. Musculoskeletal:        General: No swelling. Normal range of motion.     Cervical back: Normal range of motion and neck supple.  Bilateral feet cool to touch. Right foot with purple/red discoloration to bilateral feet with ulceration noted to second toe, tip of great toe, base of great toe and erythema extending proximally.  Shin with chronic appearing wound.  Left foot with purple discoloration noted to midfoot.  Unable to palpate pulses bilaterally. Skin:    General: Skin is warm and dry.     Capillary Refill: Capillary refill takes less than 2 seconds.     Findings: No rash.  Neurological:     Mental Status: The patient is awake and alert.         ED Results / Procedures / Treatments   Labs (all labs ordered are listed, but only abnormal results are displayed) Labs Reviewed  CBC - Abnormal; Notable for the following components:      Result Value   MCV 100.5 (*)    RDW 17.5 (*)    Platelets 148 (*)    All other components within normal limits  BASIC METABOLIC PANEL WITH GFR - Abnormal; Notable for the following components:   Potassium 3.4 (*)    Chloride 93 (*)    Glucose, Bld 132 (*)    BUN 39 (*)    Creatinine, Ser 4.84 (*)    Calcium  7.7 (*)    GFR, Estimated 10 (*)    Anion gap 16 (*)    All other components within normal limits  CULTURE, BLOOD (ROUTINE X 2)  CULTURE, BLOOD (ROUTINE X 2)  LACTIC ACID, PLASMA  LACTIC ACID, PLASMA  SEDIMENTATION RATE  C-REACTIVE PROTEIN     EKG     RADIOLOGY     PROCEDURES:  Critical Care performed:   Procedures   MEDICATIONS ORDERED IN ED: Medications  diphenhydrAMINE  (BENADRYL ) capsule 50 mg (has no administration in time range)     Or  diphenhydrAMINE  (BENADRYL ) injection 50 mg (has no administration in time range)  methylPREDNISolone  sodium succinate (SOLU-MEDROL ) 40 mg/mL injection 40 mg (40 mg Intravenous Given 05/01/24 1407)  fentaNYL  (SUBLIMAZE ) injection 50 mcg (50  mcg Intravenous Given 05/01/24 1406)     IMPRESSION / MDM / ASSESSMENT AND PLAN / ED COURSE  I reviewed the triage vital signs and the nursing notes.   Differential diagnosis includes, but is not limited to, critical limb ischemia, PAD, claudication, DVT.  I reviewed the patient's chart.  Patient had bilateral lower extremity ultrasounds on 04/11/2024 which was negative for DVT bilaterally.  Patient was recently admitted from 03/22/2024 until 03/24/2024 for joint swelling.  Per the pictures in the chart at that time she also had redness to her toes, however this appears to be worse today.  Additionally, her pain is new today, also concerning to me for critical limb ischemia.  Further workup is indicated.  Labs obtained reveal end-stage renal failure per her baseline, no leukocytosis.  I had Dr. Arlander evaluate her as well and I consulted Dr. Army with vascular surgery to come to evaluate the patient.  Vascular surgery is able to Doppler for pulses, though recommend CTA bifemoral runoff.  Unfortunately, patient has a contrast allergy and requires premedication.  Pain controlled in the meantime with fentanyl .  Patient passed off to Dr. Willo pending CTA and final disposition.   Patient's presentation is most consistent with acute presentation with potential threat to life or bodily function.   Clinical Course as of 05/01/24 1520  Mon May 01, 2024  1403 Dr. Army came to evaluate the patient and is able to find pulses with the Doppler [JP]    Clinical Course User Index [JP] Juley Giovanetti E, PA-C     FINAL CLINICAL IMPRESSION(S) / ED DIAGNOSES   Final diagnoses:  Pain in both lower extremities     Rx / DC Orders   ED Discharge Orders      None        Note:  This document was prepared using Dragon voice recognition software and may include unintentional dictation errors.   Wendelin Bradt E, PA-C 05/01/24 1520    Arlander [place], MD 05/05/24 [id number]

## 2024-05-01 NOTE — ED Notes (Signed)
 See triage note  Presents with increased pain and swelling to right lower leg  Per husband swelling has been to both feet but the right leg redness became worse this am

## 2024-05-01 NOTE — ED Triage Notes (Signed)
 Pt to ED via POV from home. Pt reports pain, edema and weeping in right leg. Pt reports still on antibiotics for lung infection.

## 2024-05-01 NOTE — Consult Note (Signed)
 Vascular and Vein Specialist of [facility]  Patient name: [name] MRN: [id number] DOB: [birth year] Sex: female   REQUESTING PROVIDER:   ER   REASON FOR CONSULT:    Foot pain  HISTORY OF PRESENT ILLNESS:   [name] is a 58 y.o. female, who presented to the emergency department today complaining of bilateral leg pain.  She states that she has been having issues for several weeks but has been experiencing pain past several.  It became so she came to the ER.  She was in the hospital in July for polyarthritis.  She has a history of peripheral neuropathy as well as renal failure.  In July she was treated with steroids which helped improve.  She was also started on colchicine  for elevated uric acid.  Patient has a history of TIAs.  She is a non-smoker.   Past Medical History:  Diagnosis Date   Anemia    vitamin d3 deficiency   Anxiety    Chronic kidney disease    End Stage Renal Disease   Diabetes mellitus without complication (HCC)    GERD (gastroesophageal reflux disease)    nothing over last few years   High serum parathyroid hormone (PTH)    checked through Dialysis   History of kidney stones 2000   Hypertension    Neuromuscular disorder (HCC)    neuropathy in feet   PONV (postoperative nausea and vomiting)    severe nausea requiring many doses of post op antiemetics   Stroke (HCC) 10/2017   thinks she had a series of mini strokes.right leg up to right side of face were numb. no loss of consciousness     FAMILY HISTORY   Family History  Problem Relation Age of Onset   Emphysema Mother    COPD Mother    Cancer Father    Cancer Paternal Aunt    Diabetes Sister    Hypertension Sister    Eczema Sister    Diabetes Brother    Hypertension Brother    Cardiomyopathy Brother    Alcohol abuse Brother     SOCIAL HISTORY:   Social History   Socioeconomic History   Marital status: Married    Spouse name: Redell   Number of  children: 0   Years of education: Not on file   Highest education level: Some college, no degree  Occupational History   Occupation: Scientist, water quality  Tobacco Use   Smoking status: Never   Smokeless tobacco: Never  Vaping Use   Vaping status: Never Used  Substance and Sexual Activity   Alcohol use: No   Drug use: Never   Sexual activity: Yes    Partners: Male  Other Topics Concern   Not on file  Social History Narrative   Not on file   Social Drivers of Health   Financial Resource Strain: Low Risk  (04/06/2024)   Received from [facility]   Overall Financial Resource Strain (CARDIA)    Difficulty of Paying Living Expenses: Not hard at all  Food Insecurity: No Food Insecurity (04/06/2024)   Received from [facility]   Hunger Vital Sign    Within the past 12 months, you worried that your food would run out before you got the money to buy more.: Never true    Within the past 12 months, the food you bought just didn't last and you didn't have money to get more.: Never true  Transportation Needs: No Transportation Needs (04/06/2024)  Received from [facility] - Transportation    In the past 12 months, has lack of transportation kept you from medical appointments or from getting medications?: No    Lack of Transportation (Non-Medical): No  Physical Activity: Unknown (01/03/2019)   Exercise Vital Sign    Days of Exercise per Week: 3 days    Minutes of Exercise per Session: Not on file  Stress: No Stress Concern Present (03/07/2018)   [employer] of Occupational Health - Occupational Stress Questionnaire    Feeling of Stress : Not at all  Social Connections: Moderately Integrated (03/07/2018)   Social Connection and Isolation Panel    Frequency of Communication with Friends and Family: More than three times a week    Frequency of Social Gatherings with Friends and Family: More than three times a week    Attends  Religious Services: More than 4 times per year    Active Member of [employer] or Organizations: No    Attends Banker Meetings: Never    Marital Status: Married  Catering manager Violence: Not At Risk (03/20/2024)   Humiliation, Afraid, Rape, and Kick questionnaire    Fear of Current or Ex-Partner: No    Emotionally Abused: No    Physically Abused: No    Sexually Abused: No    ALLERGIES:    Allergies  Allergen Reactions   Enalapril Hives and Other (See Comments)    Angioedema face.   Ivp Dye [Iodinated Contrast Media] Hives   Lisinopril Shortness Of Breath    CURRENT MEDICATIONS:    Current Facility-Administered Medications  Medication Dose Route Frequency Provider Last Rate Last Admin   diphenhydrAMINE  (BENADRYL ) capsule 50 mg  50 mg Oral Once Poggi, Jenna E, PA-C       Or   diphenhydrAMINE  (BENADRYL ) injection 50 mg  50 mg Intravenous Once Poggi, Jenna E, PA-C       fentaNYL  (SUBLIMAZE ) injection 50 mcg  50 mcg Intravenous Once Poggi, Jenna E, PA-C       methylPREDNISolone  sodium succinate (SOLU-MEDROL ) 40 mg/mL injection 40 mg  40 mg Intravenous Once Poggi, Jenna E, PA-C       Current Outpatient Medications  Medication Sig Dispense Refill   albuterol  (VENTOLIN  HFA) 108 (90 Base) MCG/ACT inhaler Inhale 2 puffs into the lungs every 6 (six) hours as needed for wheezing or shortness of breath. 8 g 2   colchicine  0.6 MG tablet Take 1 tablet (0.6 mg total) by mouth daily for 5 days. 5 tablet 0   NOVOLOG  FLEXPEN 100 UNIT/ML FlexPen Inject 14 Units into the skin 3 (three) times daily with meals.     oxyCODONE  (OXY IR/ROXICODONE ) 5 MG immediate release tablet Take 1 tablet (5 mg total) by mouth 2 (two) times daily as needed for moderate pain (pain score 4-6). 15 tablet 0   sevelamer  carbonate (RENVELA ) 800 MG tablet Take 2,400 mg by mouth 3 (three) times daily with meals.     Vitamin D , Ergocalciferol , (DRISDOL ) 1.25 MG (50000 UNIT) CAPS capsule Take 1 capsule (50,000 Units  total) by mouth every 7 (seven) days. (Patient taking differently: Take 50,000 Units by mouth every 7 (seven) days. Monday) 5 capsule 0    REVIEW OF SYSTEMS:   [X]  denotes positive finding, [ ]  denotes negative finding Cardiac  Comments:  Chest pain or chest pressure:    Shortness of breath upon exertion:    Short of breath when lying flat:    Irregular heart rhythm:  Vascular    Pain in calf, thigh, or hip brought on by ambulation:    Pain in feet at night that wakes you up from your sleep:     Blood clot in your veins:    Leg swelling:         Pulmonary    Oxygen at home:    Productive cough:     Wheezing:         Neurologic    Sudden weakness in arms or legs:     Sudden numbness in arms or legs:     Sudden onset of difficulty speaking or slurred speech:    Temporary loss of vision in one eye:     Problems with dizziness:         Gastrointestinal    Blood in stool:      Vomited blood:         Genitourinary    Burning when urinating:     Blood in urine:        Psychiatric    Major depression:         Hematologic    Bleeding problems:    Problems with blood clotting too easily:        Skin    Rashes or ulcers:        Constitutional    Fever or chills:     PHYSICAL EXAM:   Vitals:   05/01/24 1304 05/01/24 1309 05/01/24 1324  BP: 112/68    Pulse: (!) 56    Resp: 18    Temp:  98.5 F (36.9 C)   TempSrc:  Oral   SpO2: 100%    Weight:   85.3 kg  Height:   5' 6 (1.676 m)    GENERAL: The patient is a well-nourished female, in no acute distress. The vital signs are documented above. CARDIAC: There is a regular rate and rhythm.  VASCULAR: Multiphasic dorsalis pedis Doppler signals bilaterally.  Mottling and dependent rubor bilaterally with significant edema and drainage PULMONARY: Nonlabored respirations ABDOMEN: Soft and non-tender with normal pitched bowel sounds.  MUSCULOSKELETAL: There are no major deformities or cyanosis. NEUROLOGIC: Intact  motor function and sensation of both feet. SKIN: There are no ulcers or rashes noted. PSYCHIATRIC: The patient has a normal affect.  STUDIES:   CT angiogram has been ordered as well as venous Dopplers  ASSESSMENT and PLAN   Bilateral foot pain: I was initially concerned that this was an ischemic process based on her complaints and the appearance of the foot however on exam she has multiphasic dorsalis pedis signals bilaterally and intact motor and sensory function.  I would still go ahead and get her CT angiogram as a suspect she has some underlying peripheral vascular disease although I do not think that is the etiology of her symptoms.  I would also get a venous ultrasound to rule out DVT.  Given her history of polyarthritis and elevated uric acid, I would repeat all of her inflammatory markers.   Malvina Serene CLORE, MD, FACS Pager [phone]

## 2024-05-01 NOTE — ED Notes (Signed)
 Lab arrived to draw 2nd culture and CRP.

## 2024-05-01 NOTE — ED Provider Notes (Signed)
-----------------------------------------   3:22 PM on 05/01/2024 -----------------------------------------  Blood pressure 112/68, pulse (!) 56, temperature 98.5 F (36.9 C), temperature source Oral, resp. rate 18, height 5' 6 (1.676 m), weight 85.3 kg, SpO2 100%.  Assuming care from PA Poggi.  In short, [name] is a 58 y.o. female with a chief complaint of Leg Pain .  Refer to the original H&P for additional details.  The current plan of care is to follow-up CTA and lower extremity US  results.  ----------------------------------------- 8:09 PM on 05/01/2024 ----------------------------------------- CTA is negative for acute vascular finding and inflammatory markers are reassuring, low suspicion for cellulitis at this time.  I do wonder if she has a vasculitis given the appearance of her legs and severe pain.  She has previously had improvement with steroids, would consider trying this again.  She was given IV Dilaudid  for pain and case discussed with hospitalist for admission.     Willo Dunnings, MD 05/01/24 2010

## 2024-05-01 NOTE — H&P (Signed)
 History and Physical    Patient: [name] FMW:[id number] DOB: 1965/10/20 DOA: 05/01/2024 DOS: the patient was seen and examined on 05/01/2024 PCP: Kennyth Chew, MD  Patient coming from: Home  Chief Complaint:  Chief Complaint  Patient presents with   Leg Pain   HPI: [name] is a 58 y.o. female with medical history significant of disease on hemodialysis Mondays Wednesdays and Fridays, type 1 diabetes, GERD, essential hypertension, diabetic neuropathy, who was dialyzed today but came to the ER complaining of bilateral lower extremity edema and pain.  Patient is having severe pain bilaterally with redness of the feet all the way to the knees.  No fever and no significant white count.  Workup was performed in the ER with suspicion for possible vasculitis rather than cellulitis.  Patient also has had similar episode before which responded to steroids.  At this point she is being admitted with bilateral lower extremity edema and pain with suspicion of possible vasculitic causes.  She has no other complaint.  Review of Systems: As mentioned in the history of present illness. All other systems reviewed and are negative. Past Medical History:  Diagnosis Date   Anemia    vitamin d3 deficiency   Anxiety    Chronic kidney disease    End Stage Renal Disease   Diabetes mellitus without complication (HCC)    GERD (gastroesophageal reflux disease)    nothing over last few years   High serum parathyroid hormone (PTH)    checked through Dialysis   History of kidney stones 2000   Hypertension    Neuromuscular disorder (HCC)    neuropathy in feet   PONV (postoperative nausea and vomiting)    severe nausea requiring many doses of post op antiemetics   Stroke (HCC) 10/2017   thinks she had a series of mini strokes.right leg up to right side of face were numb. no loss of consciousness   Past Surgical History:  Procedure Laterality Date   ABDOMINAL HYSTERECTOMY  2007   APPLICATION OF WOUND  VAC Left 11/15/2021   Procedure: APPLICATION OF WOUND VAC;  Surgeon: Krasinski, Kevin, MD;  Location: [facility] ORS;  Service: Orthopedics;  Laterality: Left;  Prevena 13cm    APPLICATION OF WOUND VAC Right 12/14/2021   Procedure: APPLICATION OF WOUND VAC;  Surgeon: Tobie Priest, MD;  Location: [facility] ORS;  Service: Orthopedics;  Laterality: Right;  [id number]   AV FISTULA INSERTION W/ RF MAGNETIC GUIDANCE N/A 12/08/2017   Procedure: AV FISTULA INSERTION W/RF MAGNETIC GUIDANCE;  Surgeon: Jama Cordella MATSU, MD;  Location: [facility] INVASIVE CV LAB;  Service: Cardiovascular;  Laterality: N/A;   DIALYSIS/PERMA CATHETER INSERTION Right 10/03/2021   Procedure: DIALYSIS/PERMA CATHETER INSERTION;  Surgeon: Jama Cordella MATSU, MD;  Location: [facility] INVASIVE CV LAB;  Service: Cardiovascular;  Laterality: Right;   HIP ARTHROPLASTY Left 09/29/2021   Procedure: ARTHROPLASTY BIPOLAR HIP (HEMIARTHROPLASTY);  Surgeon: Leora Lynwood SAUNDERS, MD;  Location: [facility] ORS;  Service: Orthopedics;  Laterality: Left;   HIP ARTHROPLASTY Right 12/14/2021   Procedure: ARTHROPLASTY BIPOLAR HIP (HEMIARTHROPLASTY);  Surgeon: Tobie Priest, MD;  Location: [facility] ORS;  Service: Orthopedics;  Laterality: Right;   INCISION AND DRAINAGE HIP Left 11/15/2021   Procedure: IRRIGATION AND DEBRIDEMENT LEFT HIP WOUND;  Surgeon: Marchia Drivers, MD;  Location: [facility] ORS;  Service: Orthopedics;  Laterality: Left;   PACEMAKER LEADLESS INSERTION N/A 03/09/2024   Procedure: PACEMAKER LEADLESS INSERTION;  Surgeon: Nancey Eulas BRAVO, MD;  Location: [facility] INVASIVE CV LAB;  Service: Cardiovascular;  Laterality: N/A;  QUADRICEPS TENDON REPAIR Right 10/16/2021   Procedure: REPAIR QUADRICEP TENDON;  Surgeon: Doll Skates, MD;  Location: [facility];  Service: Orthopedics;  Laterality: Right;   ROTATOR CUFF REPAIR Right 2004   SHOULDER CLOSED REDUCTION Right 2004   UPPER EXTREMITY VENOGRAPHY Left 02/15/2018   Procedure: UPPER EXTREMITY VENOGRAPHY;  Surgeon: Jama Cordella MATSU, MD;   Location: [facility] INVASIVE CV LAB;  Service: Cardiovascular;  Laterality: Left;   Social History:  reports that she has never smoked. She has never used smokeless tobacco. She reports that she does not drink alcohol and does not use drugs.  Allergies  Allergen Reactions   Enalapril Hives and Other (See Comments)    Angioedema face.   Ivp Dye [Iodinated Contrast Media] Hives   Lisinopril Shortness Of Breath    Family History  Problem Relation Age of Onset   Emphysema Mother    COPD Mother    Cancer Father    Cancer Paternal Aunt    Diabetes Sister    Hypertension Sister    Eczema Sister    Diabetes Brother    Hypertension Brother    Cardiomyopathy Brother    Alcohol abuse Brother     Prior to Admission medications   Medication Sig Start Date End Date Taking? Authorizing Provider  albuterol  (VENTOLIN  HFA) 108 (90 Base) MCG/ACT inhaler Inhale 2 puffs into the lungs every 6 (six) hours as needed for wheezing or shortness of breath. 02/20/24   [name], MD  colchicine  0.6 MG tablet Take 1 tablet (0.6 mg total) by mouth daily for 5 days. 03/24/24 03/29/24  Tobie Calix, MD  NOVOLOG  FLEXPEN 100 UNIT/ML FlexPen Inject 14 Units into the skin 3 (three) times daily with meals.    [provider]  oxyCODONE  (OXY IR/ROXICODONE ) 5 MG immediate release tablet Take 1 tablet (5 mg total) by mouth 2 (two) times daily as needed for moderate pain (pain score 4-6). 03/24/24   Patel, Sona, MD  sevelamer  carbonate (RENVELA ) 800 MG tablet Take 2,400 mg by mouth 3 (three) times daily with meals. 12/18/23   [provider]  Vitamin D , Ergocalciferol , (DRISDOL ) 1.25 MG (50000 UNIT) CAPS capsule Take 1 capsule (50,000 Units total) by mouth every 7 (seven) days. Patient taking differently: Take 50,000 Units by mouth every 7 (seven) days. Monday 11/10/21   Pegge Toribio PARAS, PA-C    Physical Exam: Vitals:   05/01/24 1304 05/01/24 1309 05/01/24 1324 05/01/24 1949  BP: 112/68   122/81   Pulse: (!) 56   83  Resp: 18   18  Temp:  98.5 F (36.9 C)    TempSrc:  Oral    SpO2: 100%   100%  Weight:   85.3 kg   Height:   5' 6 (1.676 m)    Constitutional: NAD, calm, comfortable Eyes: PERRL, lids and conjunctivae normal ENMT: Mucous membranes are moist. Posterior pharynx clear of any exudate or lesions.Normal dentition.  Neck: normal, supple, no masses, no thyromegaly Respiratory: clear to auscultation bilaterally, no wheezing, no crackles. Normal respiratory effort. No accessory muscle use.  Cardiovascular: Regular rate and rhythm, no murmurs / rubs / gallops. No extremity edema. 2+ pedal pulses. No carotid bruits.  Abdomen: no tenderness, no masses palpated. No hepatosplenomegaly. Bowel sounds positive.  Musculoskeletal: Bilateral lower extremities swelling, rate, tender, good range of motion, no joint swelling or tenderness, Skin: no rashes, lesions, ulcers. No induration Neurologic: CN 2-12 grossly intact. Sensation intact, DTR normal. Strength 5/5 in all 4.  Psychiatric: Normal judgment  and insight. Alert and oriented x 3. Normal mood  Data Reviewed:  Temperature 98.5, blood pressure 112/68, pulse 98 respiratory of 18 oxygen sats 100% on room air, CBC and chemistry appear to be within normal except potassium 3.4 glucose 132.  BUN 39 creatinine 4.84 and calcium  7.7.  GFR is 10.  Venous Doppler ultrasound showed no evidence of DVT.  Mainly subcutaneous edema.  CT angiogram aorta by femoral bilateral lower extremity showed scattered atherosclerotic disease with no vascular abnormality arterial or venous.  There is also changes consistent with end-stage renal failure with renal atrophy and bony changes  Assessment and Plan:  #1 bilateral lower extremity edema and pain: Exact cause unclear.  Suspicion for vasculitic changes.  Patient will be admitted mainly for pain management and supportive care.  She responded to steroids in the past.  Initiate IV steroids.  Elevate the foot.   If no significant improvement may need to get vascular involved once again.  #2 type 1 diabetes: Initiate and continue with sliding scale insulin   #3 end-stage renal disease: On hemodialysis Mondays Wednesdays and Fridays.  Nephrology will be consulted  #4 essential hypertension: Will resume home regimen  #5 peripheral arterial disease: Continue with home regimen again.  #6 hyperlipidemia: Continue statin  #7 hypokalemia: Replete with hemodialysis.  #8 anemia of chronic disease: Stable at baseline.    Advance Care Planning:   Code Status: Full Code   Consults: Nephrology  Family Communication: No family at bedside  Severity of Illness: The appropriate patient status for this patient is INPATIENT. Inpatient status is judged to be reasonable and necessary in order to provide the required intensity of service to ensure the patient's safety. The patient's presenting symptoms, physical exam findings, and initial radiographic and laboratory data in the context of their chronic comorbidities is felt to place them at high risk for further clinical deterioration. Furthermore, it is not anticipated that the patient will be medically stable for discharge from the hospital within 2 midnights of admission.   * I certify that at the point of admission it is my clinical judgment that the patient will require inpatient hospital care spanning beyond 2 midnights from the point of admission due to high intensity of service, high risk for further deterioration and high frequency of surveillance required.*  AuthorBETHA SIM KNOLL, MD 05/01/2024 8:27 PM  For on call review [email or web address].

## 2024-05-02 ENCOUNTER — Inpatient Hospital Stay
Admit: 2024-05-02 | Discharge: 2024-05-02 | Disposition: A | Discharge status: Home / Self Care | Attending: Obstetrics and Gynecology | Admitting: Obstetrics and Gynecology

## 2024-05-02 DIAGNOSIS — I38 Endocarditis, valve unspecified: Secondary | ICD-10-CM | POA: Diagnosis not present

## 2024-05-02 DIAGNOSIS — E1122 Type 2 diabetes mellitus with diabetic chronic kidney disease: Secondary | ICD-10-CM

## 2024-05-02 DIAGNOSIS — L950 Livedoid vasculitis: Secondary | ICD-10-CM

## 2024-05-02 DIAGNOSIS — I120 Hypertensive chronic kidney disease with stage 5 chronic kidney disease or end stage renal disease: Secondary | ICD-10-CM

## 2024-05-02 DIAGNOSIS — Z992 Dependence on renal dialysis: Secondary | ICD-10-CM

## 2024-05-02 DIAGNOSIS — Z794 Long term (current) use of insulin: Secondary | ICD-10-CM

## 2024-05-02 DIAGNOSIS — R230 Cyanosis: Secondary | ICD-10-CM

## 2024-05-02 DIAGNOSIS — N186 End stage renal disease: Secondary | ICD-10-CM

## 2024-05-02 DIAGNOSIS — M7989 Other specified soft tissue disorders: Secondary | ICD-10-CM | POA: Diagnosis not present

## 2024-05-02 LAB — GLUCOSE, CAPILLARY
Glucose-Capillary: 324 mg/dL — ABNORMAL HIGH (ref 70–99)
Glucose-Capillary: 297 mg/dL — ABNORMAL HIGH (ref 70–99)
Glucose-Capillary: 442 mg/dL — ABNORMAL HIGH (ref 70–99)
Glucose-Capillary: 430 mg/dL — ABNORMAL HIGH (ref 70–99)
Glucose-Capillary: 301 mg/dL — ABNORMAL HIGH (ref 70–99)

## 2024-05-02 LAB — BLOOD CULTURE ID PANEL (REFLEXED) - BCID2

## 2024-05-02 LAB — CBC
HCT: 43.6 % (ref 36.0–46.0)
Hemoglobin: 13.7 g/dL (ref 12.0–15.0)
MCH: 31.4 pg (ref 26.0–34.0)
MCHC: 31.4 g/dL (ref 30.0–36.0)
MCV: 100 fL (ref 80.0–100.0)
Platelets: 105 K/uL — ABNORMAL LOW (ref 150–400)
RBC: 4.36 MIL/uL (ref 3.87–5.11)
RDW: 17.5 % — ABNORMAL HIGH (ref 11.5–15.5)
WBC: 5.9 K/uL (ref 4.0–10.5)
nRBC: 0 % (ref 0.0–0.2)

## 2024-05-02 LAB — C-REACTIVE PROTEIN: CRP: 1.7 mg/dL — ABNORMAL HIGH (ref <1.0)

## 2024-05-02 LAB — COMPREHENSIVE METABOLIC PANEL WITH GFR
ALT: 17 U/L (ref 0–44)
AST: 11 U/L — ABNORMAL LOW (ref 15–41)
Albumin: 3.4 g/dL — ABNORMAL LOW (ref 3.5–5.0)
Alkaline Phosphatase: 94 U/L (ref 38–126)
Anion gap: 20 — ABNORMAL HIGH (ref 5–15)
BUN: 58 mg/dL — ABNORMAL HIGH (ref 6–20)
CO2: 24 mmol/L (ref 22–32)
Calcium: 7.6 mg/dL — ABNORMAL LOW (ref 8.9–10.3)
Chloride: 92 mmol/L — ABNORMAL LOW (ref 98–111)
Creatinine, Ser: 6.05 mg/dL — ABNORMAL HIGH (ref 0.44–1.00)
GFR, Estimated: 7 mL/min — ABNORMAL LOW (ref >60)
Glucose, Bld: 224 mg/dL — ABNORMAL HIGH (ref 70–99)
Potassium: 4.8 mmol/L (ref 3.5–5.1)
Sodium: 136 mmol/L (ref 135–145)
Total Bilirubin: 1.5 mg/dL — ABNORMAL HIGH (ref 0.0–1.2)
Total Protein: 5.8 g/dL — ABNORMAL LOW (ref 6.5–8.1)

## 2024-05-02 LAB — BRAIN NATRIURETIC PEPTIDE: B Natriuretic Peptide: 2615.5 pg/mL — ABNORMAL HIGH (ref 0.0–100.0)

## 2024-05-02 MED ORDER — INSULIN GLARGINE-YFGN 100 UNIT/ML ~~LOC~~ SOLN: 10.0000 [IU] | Freq: Every day | SUBCUTANEOUS | Status: DC

## 2024-05-02 MED ORDER — VANCOMYCIN HCL 1000 MG/200ML IV SOLN
1000.0000 mg | INTRAVENOUS | Status: DC
  Administered 2024-05-03: 1000 mg via INTRAVENOUS
  Filled 2024-05-02 (×2): qty 200

## 2024-05-02 MED ORDER — INSULIN ASPART 100 UNIT/ML IJ SOLN
0.0000 [IU] | Freq: Three times a day (TID) | INTRAMUSCULAR | Status: DC
  Administered 2024-05-02: 20 [IU] via SUBCUTANEOUS
  Administered 2024-05-03: 7 [IU] via SUBCUTANEOUS
  Filled 2024-05-02 (×2): qty 1

## 2024-05-02 MED ORDER — ALBUTEROL SULFATE (2.5 MG/3ML) 0.083% IN NEBU: 3.0000 mL | INHALATION_SOLUTION | Freq: Four times a day (QID) | RESPIRATORY_TRACT | Status: DC | PRN

## 2024-05-02 MED ORDER — INSULIN ASPART 100 UNIT/ML IJ SOLN: 0.0000 [IU] | Freq: Three times a day (TID) | INTRAMUSCULAR | Status: DC

## 2024-05-02 MED ORDER — OXYCODONE HCL 5 MG PO TABS
5.0000 mg | ORAL_TABLET | Freq: Four times a day (QID) | ORAL | Status: DC | PRN
  Administered 2024-05-02 – 2024-05-03 (×3): 5 mg via ORAL
  Filled 2024-05-02 (×4): qty 1

## 2024-05-02 MED ORDER — INSULIN GLARGINE 100 UNIT/ML ~~LOC~~ SOLN
10.0000 [IU] | Freq: Every day | SUBCUTANEOUS | Status: DC
  Administered 2024-05-02: 10 [IU] via SUBCUTANEOUS
  Filled 2024-05-02: qty 0.1

## 2024-05-02 MED ORDER — ACETAMINOPHEN 325 MG PO TABS: 650.0000 mg | ORAL_TABLET | Freq: Four times a day (QID) | ORAL | Status: DC | PRN

## 2024-05-02 MED ORDER — SODIUM CHLORIDE 0.9% FLUSH
10.0000 mL | Freq: Two times a day (BID) | INTRAVENOUS | Status: DC
  Administered 2024-05-02: 10 mL

## 2024-05-02 MED ORDER — VANCOMYCIN HCL 1750 MG/350ML IV SOLN
1750.0000 mg | Freq: Once | INTRAVENOUS | Status: AC
  Administered 2024-05-02: 1750 mg via INTRAVENOUS
  Filled 2024-05-02: qty 350

## 2024-05-02 MED ORDER — SODIUM CHLORIDE 0.9% FLUSH: 10.0000 mL | INTRAVENOUS | Status: DC | PRN

## 2024-05-02 MED ORDER — CHLORHEXIDINE GLUCONATE CLOTH 2 % EX PADS
6.0000 | MEDICATED_PAD | Freq: Every day | CUTANEOUS | Status: DC
  Administered 2024-05-02: 6 via TOPICAL

## 2024-05-02 NOTE — Progress Notes (Signed)
 Arrived for IV team consult for IV restart.  Reviewed MAR with Mary-Catherine, RN to find that patient is now to receive her IV meds during dialysis.  Per Mary-Catherine okay to not restart IV at this time but will re enter consult if patient needs change.

## 2024-05-02 NOTE — Progress Notes (Signed)
 Arrived to pt room. Observed PIV was placed by floor staff.  Consult complete

## 2024-05-02 NOTE — Inpatient Diabetes Management (Signed)
 Inpatient Diabetes Program Recommendations  AACE/ADA: New Consensus Statement on Inpatient Glycemic Control   Target Ranges:  Prepandial:   less than 140 mg/dL      Peak postprandial:   less than 180 mg/dL (1-2 hours)      Critically ill patients:  140 - 180 mg/dL    Latest Reference Range & Units 05/01/24 21:53 05/02/24 08:02  Glucose-Capillary 70 - 99 mg/dL 728 (H) 675 (H)   Review of Glycemic Control  Diabetes history: DM2 Outpatient Diabetes medications: Novolog  14 units TID Current orders for Inpatient glycemic control: Novolog  0-9 units TID with meals, Novolog  0-5 units at bedtime; Solumedrol 40 mg Q12H  Inpatient Diabetes Program Recommendations:    Insulin : If steroids are continued, please consider ordering insulin  glargine 13 units Q24H.  Thanks, Earnie Gainer, RN, MSN, CDCES Diabetes Coordinator Inpatient Diabetes Program [phone] (Team Pager from 8am to 5pm)

## 2024-05-02 NOTE — Plan of Care (Signed)

## 2024-05-02 NOTE — Progress Notes (Signed)
 PHARMACY - PHYSICIAN COMMUNICATION CRITICAL VALUE ALERT - BLOOD CULTURE IDENTIFICATION (BCID)  [name] is an 58 y.o. female who presented to [facility] on 05/01/2024 with a chief complaint of bilateral leg pain and edema.    Assessment:  Blood cultures from 9/1 with GPC in 1 of 3 bottles, BCID detects staphylococcus species (NOT S aureus or S epidermidis).   Name of physician (or Provider) Contacted: Dr Kandis  Current antibiotics: None  Changes to prescribed antibiotics recommended:  Concern for true bacteremia will repeat blood cultures and start vancomycin    Results for orders placed or performed during the hospital encounter of 05/01/24  Blood Culture ID Panel (Reflexed) (Collected: 05/01/2024  4:39 PM)  Result Value Ref Range   Enterococcus faecalis NOT DETECTED NOT DETECTED   Enterococcus Faecium NOT DETECTED NOT DETECTED   Listeria monocytogenes NOT DETECTED NOT DETECTED   Staphylococcus species DETECTED (A) NOT DETECTED   Staphylococcus aureus (BCID) NOT DETECTED NOT DETECTED   Staphylococcus epidermidis NOT DETECTED NOT DETECTED   Staphylococcus lugdunensis NOT DETECTED NOT DETECTED   Streptococcus species NOT DETECTED NOT DETECTED   Streptococcus agalactiae NOT DETECTED NOT DETECTED   Streptococcus pneumoniae NOT DETECTED NOT DETECTED   Streptococcus pyogenes NOT DETECTED NOT DETECTED   A.calcoaceticus-baumannii NOT DETECTED NOT DETECTED   Bacteroides fragilis NOT DETECTED NOT DETECTED   Enterobacterales NOT DETECTED NOT DETECTED   Enterobacter cloacae complex NOT DETECTED NOT DETECTED   Escherichia coli NOT DETECTED NOT DETECTED   Klebsiella aerogenes NOT DETECTED NOT DETECTED   Klebsiella oxytoca NOT DETECTED NOT DETECTED   Klebsiella pneumoniae NOT DETECTED NOT DETECTED   Proteus species NOT DETECTED NOT DETECTED   Salmonella species NOT DETECTED NOT DETECTED   Serratia marcescens NOT DETECTED NOT DETECTED   Haemophilus influenzae NOT DETECTED NOT DETECTED    Neisseria meningitidis NOT DETECTED NOT DETECTED   Pseudomonas aeruginosa NOT DETECTED NOT DETECTED   Stenotrophomonas maltophilia NOT DETECTED NOT DETECTED   Candida albicans NOT DETECTED NOT DETECTED   Candida auris NOT DETECTED NOT DETECTED   Candida glabrata NOT DETECTED NOT DETECTED   Candida krusei NOT DETECTED NOT DETECTED   Candida parapsilosis NOT DETECTED NOT DETECTED   Candida tropicalis NOT DETECTED NOT DETECTED   Cryptococcus neoformans/gattii NOT DETECTED NOT DETECTED    Celestine Slovak, PharmD, BCPS, BCIDP Work Cell: [phone] 05/02/2024 8:31 AM

## 2024-05-02 NOTE — Progress Notes (Addendum)
 PROGRESS NOTE    [name]  FMW:[id number] DOB: 17-Apr-1966 DOA: 05/01/2024 PCP: Kennyth Chew, MD     Brief Narrative:   From admission h and p  [name] is a 58 y.o. female with medical history significant of disease on hemodialysis Mondays Wednesdays and Fridays, type 1 diabetes, GERD, essential hypertension, diabetic neuropathy, who was dialyzed today but came to the ER complaining of bilateral lower extremity edema and pain.  Patient is having severe pain bilaterally with redness of the feet all the way to the knees.  No fever and no significant white count.  Workup was performed in the ER with suspicion for possible vasculitis rather than cellulitis.  Patient also has had similar episode before which responded to steroids.  At this point she is being admitted with bilateral lower extremity edema and pain with suspicion of possible vasculitic causes.  She has no other complaint.    Assessment & Plan:   Principal Problem:   Leg swelling Active Problems:   Uncontrolled hypertension   Dyslipidemia   ESRD on dialysis (HCC)   Type 1 diabetes mellitus (HCC)   CHB (complete heart block) (HCC)   History of CVA (cerebrovascular accident)   PAD (peripheral artery disease) (HCC)  # Lower extremity edema, erythema Progressively worsening for past couple of months. With one positive blood culture and some toe lesions that look to possibly be infarcts, think we have to consider bacteremia with septic emboli. Vasculitis also a possibility. Patient does have CHF but skin changes are not typical for simple edema. - dr dew has reviewed angiogram, does not think this is an arterial occlusive problem - will start antibiotics as below and consult ID - if this does not appear to be either an ischemic or infectious process then will need to consider consulting Dr. Tobie of rheumatology - will hold on additional steroids for now  # Bacteremia One of two cultures growing GPCs, staph species,  not aureus or epidermidis.  - repeat blood cultures - start vanc after repeat cultures - ID consult as above  # ESRD On [facility] hemodialysis, received yesterday - nephrology consulted  # History complete heart block PPM placed July of this year - will need TTE possible TEE if bacteremia confirmed  # HFrEF # Mitral regurg # Tricuspid regurg # Pulmonary hypertension July TTE shows ef 40-45, mild RV dysfunction, severe pHTN, and severe TR and MR.  - volume mgmt with dialysis - may need TTE/TEE as above  # T1DM Glucose elevated in setting of steroids - hold additional steroids - SSI for now - semglee  10 today  # HTN Bp appropriate, doesn't appear to be on home meds - monitor  DVT prophylaxis: heparin  Code Status: full Family Communication: none at bedside  Level of care: Med-Surg Status is: Inpatient Remains inpatient appropriate because: severity of illness    Consultants:  Vascular, ID, nephrology  Procedures: None thus far  Antimicrobials:  Will start vancomycin     Subjective: Reports stable b/l leg pain otherwise feeling well  Objective: Vitals:   05/01/24 1949 05/01/24 2114 05/02/24 0418 05/02/24 0758  BP: 122/81 114/83 113/77 132/69  Pulse: 83 98 67 61  Resp: 18 14 14 16   Temp:  98 F (36.7 C) 98 F (36.7 C) 97.6 F (36.4 C)  TempSrc:    Oral  SpO2: 100% 97% 99% 94%  Weight:      Height:       No intake or output data in the 24 hours ending  05/02/24 1002 Filed Weights   05/01/24 1324  Weight: 85.3 kg    Examination:  General exam: Appears calm and comfortable  Respiratory system: Clear to auscultation. Respiratory effort normal. Cardiovascular system: S1 & S2 heard, RRR. Systolic murmur Gastrointestinal system: Abdomen is nondistended, soft and nontender. Central nervous system: Alert and oriented. No focal neurological deficits. Extremities:      Psychiatry: Judgement and insight appear normal. Mood & affect appropriate.      Data Reviewed: I have personally reviewed following labs and imaging studies  CBC: Recent Labs  Lab 05/01/24 1308 05/02/24 0518  WBC 7.5 5.9  HGB 13.9 13.7  HCT 43.1 43.6  MCV 100.5* 100.0  PLT 148* 105*   Basic Metabolic Panel: Recent Labs  Lab 05/01/24 1308 05/02/24 0518  NA 138 136  K 3.4* 4.8  CL 93* 92*  CO2 29 24  GLUCOSE 132* 224*  BUN 39* 58*  CREATININE 4.84* 6.05*  CALCIUM  7.7* 7.6*  MG 2.0  --   PHOS 3.3  --    GFR: Estimated Creatinine Clearance: 11 mL/min (A) (by C-G formula based on SCr of 6.05 mg/dL (H)). Liver Function Tests: Recent Labs  Lab 05/02/24 0518  AST 11*  ALT 17  ALKPHOS 94  BILITOT 1.5*  PROT 5.8*  ALBUMIN 3.4*   No results for input(s): LIPASE, AMYLASE in the last 168 hours. No results for input(s): AMMONIA in the last 168 hours. Coagulation Profile: No results for input(s): INR, PROTIME in the last 168 hours. Cardiac Enzymes: No results for input(s): CKTOTAL, CKMB, CKMBINDEX, TROPONINI in the last 168 hours. BNP (last 3 results) No results for input(s): PROBNP in the last 8760 hours. HbA1C: No results for input(s): HGBA1C in the last 72 hours. CBG: Recent Labs  Lab 05/01/24 2153 05/02/24 0802  GLUCAP 271* 324*   Lipid Profile: No results for input(s): CHOL, HDL, LDLCALC, TRIG, CHOLHDL, LDLDIRECT in the last 72 hours. Thyroid  Function Tests: No results for input(s): TSH, T4TOTAL, FREET4, T3FREE, THYROIDAB in the last 72 hours. Anemia Panel: No results for input(s): VITAMINB12, FOLATE, FERRITIN, TIBC, IRON , RETICCTPCT in the last 72 hours. Urine analysis: No results found for: COLORURINE, APPEARANCEUR, LABSPEC, PHURINE, GLUCOSEU, HGBUR, BILIRUBINUR, KETONESUR, PROTEINUR, UROBILINOGEN, NITRITE, LEUKOCYTESUR Sepsis Labs: @LABRCNTIP (procalcitonin:4,lacticidven:4)  ) Recent Results (from the past 240 hours)  Blood culture (routine x 2)      Status: None (Preliminary result)   Collection Time: 05/01/24  4:39 PM   Specimen: BLOOD  Result Value Ref Range Status   Specimen Description BLOOD BLOOD RIGHT FOREARM  Final   Special Requests   Final    BOTTLES DRAWN AEROBIC AND ANAEROBIC Blood Culture results may not be optimal due to an inadequate volume of blood received in culture bottles   Culture  Setup Time   Final    GRAM POSITIVE COCCI AEROBIC BOTTLE ONLY Organism ID to follow CRITICAL RESULT CALLED TO, READ BACK BY AND VERIFIED WITH: KRISTIN MERRILL, PHARMD AT 0740 ON 05/02/24 BY GM Performed at [facility], [address] [place], [place] [address]    Culture [facility] POSITIVE COCCI  Final   Report Status PENDING  Incomplete  Blood Culture ID Panel (Reflexed)     Status: Abnormal   Collection Time: 05/01/24  4:39 PM  Result Value Ref Range Status   Enterococcus faecalis NOT DETECTED NOT DETECTED Final   Enterococcus Faecium NOT DETECTED NOT DETECTED Final   Listeria monocytogenes NOT DETECTED NOT DETECTED Final   Staphylococcus species DETECTED (A)  NOT DETECTED Final    Comment: CRITICAL RESULT CALLED TO, READ BACK BY AND VERIFIED WITH: KRISTIN MERRILL, PHARMD AT 0740 ON 05/02/24 BY GM    Staphylococcus aureus (BCID) NOT DETECTED NOT DETECTED Final   Staphylococcus epidermidis NOT DETECTED NOT DETECTED Final   Staphylococcus lugdunensis NOT DETECTED NOT DETECTED Final   Streptococcus species NOT DETECTED NOT DETECTED Final   Streptococcus agalactiae NOT DETECTED NOT DETECTED Final   Streptococcus pneumoniae NOT DETECTED NOT DETECTED Final   Streptococcus pyogenes NOT DETECTED NOT DETECTED Final   A.calcoaceticus-baumannii NOT DETECTED NOT DETECTED Final   Bacteroides fragilis NOT DETECTED NOT DETECTED Final   Enterobacterales NOT DETECTED NOT DETECTED Final   Enterobacter cloacae complex NOT DETECTED NOT DETECTED Final   Escherichia coli NOT DETECTED NOT DETECTED Final   Klebsiella aerogenes NOT DETECTED  NOT DETECTED Final   Klebsiella oxytoca NOT DETECTED NOT DETECTED Final   Klebsiella pneumoniae NOT DETECTED NOT DETECTED Final   Proteus species NOT DETECTED NOT DETECTED Final   Salmonella species NOT DETECTED NOT DETECTED Final   Serratia marcescens NOT DETECTED NOT DETECTED Final   Haemophilus influenzae NOT DETECTED NOT DETECTED Final   Neisseria meningitidis NOT DETECTED NOT DETECTED Final   Pseudomonas aeruginosa NOT DETECTED NOT DETECTED Final   Stenotrophomonas maltophilia NOT DETECTED NOT DETECTED Final   Candida albicans NOT DETECTED NOT DETECTED Final   Candida auris NOT DETECTED NOT DETECTED Final   Candida glabrata NOT DETECTED NOT DETECTED Final   Candida krusei NOT DETECTED NOT DETECTED Final   Candida parapsilosis NOT DETECTED NOT DETECTED Final   Candida tropicalis NOT DETECTED NOT DETECTED Final   Cryptococcus neoformans/gattii NOT DETECTED NOT DETECTED Final    Comment: Performed at [facility], [address] [place], [place] [address]  Blood culture (routine x 2)     Status: None (Preliminary result)   Collection Time: 05/01/24  5:22 PM   Specimen: BLOOD  Result Value Ref Range Status   Specimen Description BLOOD BLOOD RIGHT HAND  Final   Special Requests   Final    BOTTLES DRAWN AEROBIC ONLY Blood Culture results may not be optimal due to an inadequate volume of blood received in culture bottles   Culture   Final    NO GROWTH < 24 HOURS Performed at [facility], [address] [place], [place] [address]    Report Status PENDING  Incomplete         Radiology Studies: CT Angio Aortobifemoral W and/or Wo Contrast Result Date: 05/01/2024 CLINICAL DATA:  Right leg pain EXAM: CT ANGIOGRAPHY OF ABDOMINAL AORTA WITH ILIOFEMORAL RUNOFF TECHNIQUE: Multidetector CT imaging of the abdomen, pelvis and lower extremities was performed using the standard protocol during bolus administration of intravenous contrast. Multiplanar CT image  reconstructions and MIPs were obtained to evaluate the vascular anatomy. RADIATION DOSE REDUCTION: This exam was performed according to the departmental dose-optimization program which includes automated exposure control, adjustment of the mA and/or kV according to patient size and/or use of iterative reconstruction technique. CONTRAST:  OMNIPAQUE  IOHEXOL  350 MG/ML SOLN COMPARISON:  None Available. FINDINGS: VASCULAR Aorta: Abdominal aorta demonstrates atherosclerotic calcifications. No significant mural thrombus or soft atherosclerotic plaque is seen. Celiac: Patent without evidence of aneurysm, dissection, vasculitis or significant stenosis. SMA: Patent without evidence of aneurysm, dissection, vasculitis or significant stenosis. Renals: Heavy atherosclerotic calcifications of the renal arteries are noted bilaterally. At least moderate to severe stenoses are noted bilaterally with renal atrophy. IMA: Patent without evidence of aneurysm,  dissection, vasculitis or significant stenosis. RIGHT Lower Extremity Inflow: The common and external iliac artery demonstrate mild atherosclerotic calcifications. No focal high-grade stenosis is noted Runoff: Common femoral artery and femoral bifurcation are within normal limits. Scattered atherosclerotic calcifications are seen. Mild stenosis is noted in the proximal popliteal artery just above the knee joint. More marked atherosclerotic calcification is noted in the distal popliteal artery although the popliteal trifurcation is patent. Two vessel runoff via the anterior tibial and peroneal artery is seen. Peroneal artery gives collaterals to the distal posterior tibial artery which demonstrates multiple proximal areas of occlusion. LEFT Lower Extremity Inflow: Common and external iliac artery demonstrate atherosclerotic plaque although this does not appear to be flow limiting and no significant poststenotic dilatation is seen. Runoff: Common femoral artery and femoral  bifurcation are widely patent. Scattered atherosclerotic changes are noted within the superficial femoral artery on the left. Similar findings are noted within the popliteal artery with a moderate stenosis just above the level of the knee joint. The more distal popliteal artery is patent in the popliteal trifurcation is patent as well. Three-vessel runoff to the left ankle is noted with the anterior tibial and posterior tibial continuing into the foot. Veins: No specific venous abnormality is noted although the examination was not timed for venous evaluation. Review of the MIP images confirms the above findings. NON-VASCULAR Lower chest: Mild emphysematous changes are noted. No focal confluent infiltrate or effusion is seen. Hepatobiliary: Fatty infiltration of the liver is noted. The gallbladder is within normal limits. Pancreas: Unremarkable. No pancreatic ductal dilatation or surrounding inflammatory changes. Spleen: Normal in size without focal abnormality. Adrenals/Urinary Tract: Adrenal glands are within normal limits. Kidneys show atrophy bilaterally with significant arterial calcifications and nephrocalcinosis. These changes are consistent with the given clinical history of chronic renal failure. Stomach/Bowel: Scattered mild diverticulosis is noted throughout the colon. No diverticulitis is seen. No obstructive changes are noted. The appendix is within normal limits. Small bowel and stomach are within normal limits. Lymphatic: No lymphadenopathy is identified. Reproductive: Status post hysterectomy. No adnexal masses. Other: No abdominal wall hernia or abnormality. No abdominopelvic ascites. Musculoskeletal: Endplate changes in the lumbar spine are noted consistent with the underlying renal failure. No acute fracture is seen. Bilateral hip replacements are noted. Old inferior pubic rami fractures are seen. No other bony abnormality is noted. Considerable soft tissue edema is noted in the lower extremities  below the knees similar to that noted on physical exam. IMPRESSION: VASCULAR Scattered atherosclerotic disease is noted described. Two vessel runoff to the right ankle is noted with three-vessel runoff to the left ankle. No arterial abnormality to correspond with the physical exam is identified. NON-VASCULAR Changes consistent with end-stage renal failure with renal atrophy and bony changes. Diverticulosis without diverticulitis. Fatty infiltration of the liver. Electronically Signed   By: Oneil Devonshire M.D.   On: 05/01/2024 19:17   US  Venous Img Lower Bilateral Result Date: 05/01/2024 EXAM: ULTRASOUND DUPLEX OF THE BILATERAL LOWER EXTREMITY VEINS TECHNIQUE: Duplex ultrasound using B-mode/gray scaled imaging and Doppler spectral analysis and color flow was obtained of the deep venous structures of the bilateral lower extremities. COMPARISON: Bilateral lower extremity venous doppler ultrasound 04/11/2024. CLINICAL HISTORY: Pain and swelling. FINDINGS: Subcutaneous edema is noted below the knee bilaterally. The visualized veins of the lower extremities are patent and free of echogenic thrombus. The veins demonstrate good compressibility with normal color flow study and spectral analysis. IMPRESSION: 1. No evidence of DVT. 2. Subcutaneous edema below the knee bilaterally.  Electronically signed by: Lonni Necessary MD 05/01/2024 05:10 PM EDT RP Workstation: [id number]        Scheduled Meds:  heparin   5,000 Units Subcutaneous Q8H   insulin  aspart  0-5 Units Subcutaneous QHS   insulin  aspart  0-9 Units Subcutaneous TID WC   methylPREDNISolone  (SOLU-MEDROL ) injection  40 mg Intravenous Q12H   Continuous Infusions:   LOS: 1 day     Devaughn KATHEE Ban, MD [facility]   If 7PM-7AM, please contact night-coverage [email or web address] Password [email or web address] 05/02/2024, 10:02 AM

## 2024-05-02 NOTE — Progress Notes (Signed)
 [facility]  ROUNDING NOTE   Subjective:   [name] s a 58 year old female with past medical conditions including anemia, GERD, hypertension, stroke, diabetes, and end-stage renal disease on hemodialysis. Patient presents to the emergency department with lower extremity edema and redness. Has been admitted for Leg swelling [M79.89] Pain in both lower extremities [M79.604, M79.605]  Patient is known to our practice and receives outpatient dialysis treatments at [facility] MWF schedule, supervised by Dr. Marcelino. Last treatment received on Monday, prior to ED arrival. Patient states her legs have been swollen and started weeping late last week. Redness and bruising have been present for awhile.   Labs on ED arrival include potassium 3.4, Calcium  7.7, Imaging negative for DVT or artery involvement.   We have been consulted to manage dialysis needs.    Objective:  Vital signs in last 24 hours:  Temp:  [97.6 F (36.4 C)-98 F (36.7 C)] 97.6 F (36.4 C) (09/02 0758) Pulse Rate:  [61-98] 61 (09/02 0758) Resp:  [14-18] 16 (09/02 0758) BP: (113-132)/(69-83) 132/69 (09/02 0758) SpO2:  [94 %-100 %] 94 % (09/02 0758)  Weight change:  Filed Weights   05/01/24 1324  Weight: 85.3 kg    Intake/Output: No intake/output data recorded.   Intake/Output this shift:  No intake/output data recorded.  Physical Exam: General: NAD  Head: Normocephalic, atraumatic. Moist oral mucosal membranes  Eyes: Anicteric  Neck: Supple  Lungs:  Clear to auscultation  Heart: Regular rate and rhythm  Abdomen:  Soft, nontender  Extremities:  1+ peripheral edema.  Neurologic: Awake, alert, conversant  Skin: BLE erythema, edema, weeping  Access: Rt Permcath    Basic Metabolic Panel: Recent Labs  Lab 05/01/24 1308 05/02/24 0518  NA 138 136  K 3.4* 4.8  CL 93* 92*  CO2 29 24  GLUCOSE 132* 224*  BUN 39* 58*  CREATININE 4.84* 6.05*  CALCIUM  7.7* 7.6*  MG 2.0  --   PHOS 3.3   --     Liver Function Tests: Recent Labs  Lab 05/02/24 0518  AST 11*  ALT 17  ALKPHOS 94  BILITOT 1.5*  PROT 5.8*  ALBUMIN 3.4*   No results for input(s): LIPASE, AMYLASE in the last 168 hours. No results for input(s): AMMONIA in the last 168 hours.  CBC: Recent Labs  Lab 05/01/24 1308 05/02/24 0518  WBC 7.5 5.9  HGB 13.9 13.7  HCT 43.1 43.6  MCV 100.5* 100.0  PLT 148* 105*    Cardiac Enzymes: No results for input(s): CKTOTAL, CKMB, CKMBINDEX, TROPONINI in the last 168 hours.  BNP: Invalid input(s): POCBNP  CBG: Recent Labs  Lab 05/01/24 2153 05/02/24 0802 05/02/24 1203  GLUCAP 271* 324* 297*    Microbiology: Results for orders placed or performed during the hospital encounter of 05/01/24  Blood culture (routine x 2)     Status: None (Preliminary result)   Collection Time: 05/01/24  4:39 PM   Specimen: BLOOD RIGHT FOREARM  Result Value Ref Range Status   Specimen Description   Final    BLOOD RIGHT FOREARM Performed at [facility], [address], [place], [place] [address]    Special Requests   Final    BOTTLES DRAWN AEROBIC AND ANAEROBIC Blood Culture results may not be optimal due to an inadequate volume of blood received in culture bottles   Culture  Setup Time   Final    GRAM POSITIVE COCCI AEROBIC BOTTLE ONLY Organism ID to follow CRITICAL RESULT CALLED TO, READ  BACK BY AND VERIFIED WITH: ALLEAN HAAS, PHARMD AT 0740 ON 05/02/24 BY GM Performed at [facility], [address] [place], [place] [address]    Culture [facility] POSITIVE COCCI  Final   Report Status PENDING  Incomplete  Blood Culture ID Panel (Reflexed)     Status: Abnormal   Collection Time: 05/01/24  4:39 PM  Result Value Ref Range Status   Enterococcus faecalis NOT DETECTED NOT DETECTED Final   Enterococcus Faecium NOT DETECTED NOT DETECTED Final   Listeria monocytogenes NOT DETECTED NOT DETECTED Final   Staphylococcus species DETECTED (A) NOT  DETECTED Final    Comment: CRITICAL RESULT CALLED TO, READ BACK BY AND VERIFIED WITH: KRISTIN MERRILL, PHARMD AT 0740 ON 05/02/24 BY GM    Staphylococcus aureus (BCID) NOT DETECTED NOT DETECTED Final   Staphylococcus epidermidis NOT DETECTED NOT DETECTED Final   Staphylococcus lugdunensis NOT DETECTED NOT DETECTED Final   Streptococcus species NOT DETECTED NOT DETECTED Final   Streptococcus agalactiae NOT DETECTED NOT DETECTED Final   Streptococcus pneumoniae NOT DETECTED NOT DETECTED Final   Streptococcus pyogenes NOT DETECTED NOT DETECTED Final   A.calcoaceticus-baumannii NOT DETECTED NOT DETECTED Final   Bacteroides fragilis NOT DETECTED NOT DETECTED Final   Enterobacterales NOT DETECTED NOT DETECTED Final   Enterobacter cloacae complex NOT DETECTED NOT DETECTED Final   Escherichia coli NOT DETECTED NOT DETECTED Final   Klebsiella aerogenes NOT DETECTED NOT DETECTED Final   Klebsiella oxytoca NOT DETECTED NOT DETECTED Final   Klebsiella pneumoniae NOT DETECTED NOT DETECTED Final   Proteus species NOT DETECTED NOT DETECTED Final   Salmonella species NOT DETECTED NOT DETECTED Final   Serratia marcescens NOT DETECTED NOT DETECTED Final   Haemophilus influenzae NOT DETECTED NOT DETECTED Final   Neisseria meningitidis NOT DETECTED NOT DETECTED Final   Pseudomonas aeruginosa NOT DETECTED NOT DETECTED Final   Stenotrophomonas maltophilia NOT DETECTED NOT DETECTED Final   Candida albicans NOT DETECTED NOT DETECTED Final   Candida auris NOT DETECTED NOT DETECTED Final   Candida glabrata NOT DETECTED NOT DETECTED Final   Candida krusei NOT DETECTED NOT DETECTED Final   Candida parapsilosis NOT DETECTED NOT DETECTED Final   Candida tropicalis NOT DETECTED NOT DETECTED Final   Cryptococcus neoformans/gattii NOT DETECTED NOT DETECTED Final    Comment: Performed at [facility], [address] [place], [place] [address]  Blood culture (routine x 2)     Status: None (Preliminary  result)   Collection Time: 05/01/24  5:22 PM   Specimen: BLOOD  Result Value Ref Range Status   Specimen Description BLOOD BLOOD RIGHT HAND  Final   Special Requests   Final    BOTTLES DRAWN AEROBIC ONLY Blood Culture results may not be optimal due to an inadequate volume of blood received in culture bottles   Culture   Final    NO GROWTH < 24 HOURS Performed at [facility], [address] [place], [place] [address]    Report Status PENDING  Incomplete    Coagulation Studies: No results for input(s): LABPROT, INR in the last 72 hours.  Urinalysis: No results for input(s): COLORURINE, LABSPEC, PHURINE, GLUCOSEU, HGBUR, BILIRUBINUR, KETONESUR, PROTEINUR, UROBILINOGEN, NITRITE, LEUKOCYTESUR in the last 72 hours.  Invalid input(s): APPERANCEUR    Imaging: CT Angio Aortobifemoral W and/or Wo Contrast Result Date: 05/01/2024 CLINICAL DATA:  Right leg pain EXAM: CT ANGIOGRAPHY OF ABDOMINAL AORTA WITH ILIOFEMORAL RUNOFF TECHNIQUE: Multidetector CT imaging of the abdomen, pelvis and lower extremities was performed using  the standard protocol during bolus administration of intravenous contrast. Multiplanar CT image reconstructions and MIPs were obtained to evaluate the vascular anatomy. RADIATION DOSE REDUCTION: This exam was performed according to the departmental dose-optimization program which includes automated exposure control, adjustment of the mA and/or kV according to patient size and/or use of iterative reconstruction technique. CONTRAST:  OMNIPAQUE  IOHEXOL  350 MG/ML SOLN COMPARISON:  None Available. FINDINGS: VASCULAR Aorta: Abdominal aorta demonstrates atherosclerotic calcifications. No significant mural thrombus or soft atherosclerotic plaque is seen. Celiac: Patent without evidence of aneurysm, dissection, vasculitis or significant stenosis. SMA: Patent without evidence of aneurysm, dissection, vasculitis or significant stenosis. Renals: Heavy  atherosclerotic calcifications of the renal arteries are noted bilaterally. At least moderate to severe stenoses are noted bilaterally with renal atrophy. IMA: Patent without evidence of aneurysm, dissection, vasculitis or significant stenosis. RIGHT Lower Extremity Inflow: The common and external iliac artery demonstrate mild atherosclerotic calcifications. No focal high-grade stenosis is noted Runoff: Common femoral artery and femoral bifurcation are within normal limits. Scattered atherosclerotic calcifications are seen. Mild stenosis is noted in the proximal popliteal artery just above the knee joint. More marked atherosclerotic calcification is noted in the distal popliteal artery although the popliteal trifurcation is patent. Two vessel runoff via the anterior tibial and peroneal artery is seen. Peroneal artery gives collaterals to the distal posterior tibial artery which demonstrates multiple proximal areas of occlusion. LEFT Lower Extremity Inflow: Common and external iliac artery demonstrate atherosclerotic plaque although this does not appear to be flow limiting and no significant poststenotic dilatation is seen. Runoff: Common femoral artery and femoral bifurcation are widely patent. Scattered atherosclerotic changes are noted within the superficial femoral artery on the left. Similar findings are noted within the popliteal artery with a moderate stenosis just above the level of the knee joint. The more distal popliteal artery is patent in the popliteal trifurcation is patent as well. Three-vessel runoff to the left ankle is noted with the anterior tibial and posterior tibial continuing into the foot. Veins: No specific venous abnormality is noted although the examination was not timed for venous evaluation. Review of the MIP images confirms the above findings. NON-VASCULAR Lower chest: Mild emphysematous changes are noted. No focal confluent infiltrate or effusion is seen. Hepatobiliary: Fatty  infiltration of the liver is noted. The gallbladder is within normal limits. Pancreas: Unremarkable. No pancreatic ductal dilatation or surrounding inflammatory changes. Spleen: Normal in size without focal abnormality. Adrenals/Urinary Tract: Adrenal glands are within normal limits. Kidneys show atrophy bilaterally with significant arterial calcifications and nephrocalcinosis. These changes are consistent with the given clinical history of chronic renal failure. Stomach/Bowel: Scattered mild diverticulosis is noted throughout the colon. No diverticulitis is seen. No obstructive changes are noted. The appendix is within normal limits. Small bowel and stomach are within normal limits. Lymphatic: No lymphadenopathy is identified. Reproductive: Status post hysterectomy. No adnexal masses. Other: No abdominal wall hernia or abnormality. No abdominopelvic ascites. Musculoskeletal: Endplate changes in the lumbar spine are noted consistent with the underlying renal failure. No acute fracture is seen. Bilateral hip replacements are noted. Old inferior pubic rami fractures are seen. No other bony abnormality is noted. Considerable soft tissue edema is noted in the lower extremities below the knees similar to that noted on physical exam. IMPRESSION: VASCULAR Scattered atherosclerotic disease is noted described. Two vessel runoff to the right ankle is noted with three-vessel runoff to the left ankle. No arterial abnormality to correspond with the physical exam is identified. NON-VASCULAR Changes consistent with end-stage  renal failure with renal atrophy and bony changes. Diverticulosis without diverticulitis. Fatty infiltration of the liver. Electronically Signed   By: Oneil Devonshire M.D.   On: 05/01/2024 19:17   US  Venous Img Lower Bilateral Result Date: 05/01/2024 EXAM: ULTRASOUND DUPLEX OF THE BILATERAL LOWER EXTREMITY VEINS TECHNIQUE: Duplex ultrasound using B-mode/gray scaled imaging and Doppler spectral analysis and  color flow was obtained of the deep venous structures of the bilateral lower extremities. COMPARISON: Bilateral lower extremity venous doppler ultrasound 04/11/2024. CLINICAL HISTORY: Pain and swelling. FINDINGS: Subcutaneous edema is noted below the knee bilaterally. The visualized veins of the lower extremities are patent and free of echogenic thrombus. The veins demonstrate good compressibility with normal color flow study and spectral analysis. IMPRESSION: 1. No evidence of DVT. 2. Subcutaneous edema below the knee bilaterally. Electronically signed by: Lonni Necessary MD 05/01/2024 05:10 PM EDT RP Workstation: [id number]     Medications:    [START ON 05/03/2024] vancomycin      vancomycin  1,750 mg (05/02/24 1251)    Chlorhexidine  Gluconate Cloth  6 each Topical Daily   heparin   5,000 Units Subcutaneous Q8H   insulin  aspart  0-5 Units Subcutaneous QHS   insulin  aspart  0-9 Units Subcutaneous TID WC   insulin  glargine  10 Units Subcutaneous QHS   sodium chloride  flush  10-40 mL Intracatheter Q12H   acetaminophen , albuterol , HYDROmorphone  (DILAUDID ) injection, ondansetron  **OR** ondansetron  (ZOFRAN ) IV, oxyCODONE , sodium chloride  flush  Assessment/ Plan:  [name] is a 58 y.o.  female with past medical conditions including anemia, GERD, hypertension, stroke, diabetes, and end-stage renal disease on hemodialysis. Patient presents to the emergency department with lower extremity edema and redness. Has been admitted for Leg swelling [M79.89] Pain in both lower extremities [M79.604, M79.605]   End stage renal disease on hemodialysis. Last treatment received on Monday. Will schedule next treatment on Wednesday to maintain outpatient schedule.   2. Anemia of chronic kidney disease Lab Results  Component Value Date   HGB 13.7 05/02/2024   Hgb acceptable at current level. No need for ESA.   3. Secondary Hyperparathyroidism: with outpatient labs: PTH 1466, phosphorus 8.8, calcium   9.1 on 04/10/24.    Lab Results  Component Value Date   CALCIUM  7.6 (L) 05/02/2024   CAION 1.00 (L) 03/09/2024   PHOS 3.3 05/01/2024     Will continue to monitor bone minerals during this admission. Prescribed calcitriol and sevelamer  with meals outpatient.   4. Diabetes mellitus type II with chronic kidney disease/renal manifestations: insulin  dependent. Home regimen includes Lantus . Most recent hemoglobin A1c is 9.8 on 03/03/24.        LOS: 1 Oryon Gary 9/2/20252:18 PM

## 2024-05-02 NOTE — Consult Note (Incomplete)
 NAME: [name]  DOB: Jan 10, 1966  MRN: [id number]  Date/Time: 05/02/2024 3:26 PM  REQUESTING PROVIDER: Dr. Kandis Subjective:  REASON FOR CONSULT: Bilateral leg lesions ? [name] is a 58 y.o. with a history of IDDS, HTN, ESRD on dilaysis, secondary hyperparathyroidism s/p parathyroidectomy, b/l THA presents to the ED on 05/01/2024 brought in by private vehicle with complaints of painful leg movements bilaterally with color discoloration which has been going on for the past few days.  She says the legs have been swollen for the few weeks now especially after her recent episode of pneumonia  2 different antibiotics  azithromycin  and Keflex .  She was then hospitalized between 03/02/2024 until 03/10/2024 for shortness of breath which she thought was asthma but was found to be in complete heart block.  She had a leadless pacemaker placement. She was back in the hospital 03/19/2024 until 03/21/2024 for joint pain especially swollen right wrist and 1st and 2nd metacarpal joints.  She did not want the workup for this.  So she went home but came back the very next day on 03/22/2024 because of swelling in feet especially the left ankle.         She was started on IV Solu-Medrol  and then placed on prednisone  and was discharged on prednisone . She also saw pulmonologist as outpatient and she did workup for vasculitis and other autoimmune conditions and that was negative.  She was given prednisone .  The joint swelling in the hands improved.  She came  back on 9 /1 because of worsening changes in her feet Vitals in the ED BP of 112/68, temperature 98.5, pulse 56, sats of 100% Labs revealed a WBC of 7.5, Hb of 13.9, platelet 148 and creatinine of 4.84. I am asked to see the patient further skin changes in the legs  Also seen by vein and vascular- CT angio legs shows vascular changes to the rt lower extremity- seen by VV but no intervention  Past Medical History:  Diagnosis Date   Anemia    vitamin d3  deficiency   Anxiety    Chronic kidney disease    End Stage Renal Disease   Diabetes mellitus without complication (HCC)    GERD (gastroesophageal reflux disease)    nothing over last few years   High serum parathyroid hormone (PTH)    checked through Dialysis   History of kidney stones 2000   Hypertension    Neuromuscular disorder (HCC)    neuropathy in feet   PONV (postoperative nausea and vomiting)    severe nausea requiring many doses of post op antiemetics   Stroke (HCC) 10/2017   thinks she had a series of mini strokes.right leg up to right side of face were numb. no loss of consciousness    Past Surgical History:  Procedure Laterality Date   ABDOMINAL HYSTERECTOMY  2007   APPLICATION OF WOUND VAC Left 11/15/2021   Procedure: APPLICATION OF WOUND VAC;  Surgeon: Krasinski, Kevin, MD;  Location: [facility];  Service: Orthopedics;  Laterality: Left;  Prevena 13cm    APPLICATION OF WOUND VAC Right 12/14/2021   Procedure: APPLICATION OF WOUND VAC;  Surgeon: Tobie Priest, MD;  Location: [facility];  Service: Orthopedics;  Laterality: Right;  [id number]   AV FISTULA INSERTION W/ RF MAGNETIC GUIDANCE N/A 12/08/2017   Procedure: AV FISTULA INSERTION W/RF MAGNETIC GUIDANCE;  Surgeon: Jama Cordella MATSU, MD;  Location: [facility];  Service: Cardiovascular;  Laterality: N/A;   DIALYSIS/PERMA CATHETER INSERTION Right 10/03/2021  Procedure: DIALYSIS/PERMA CATHETER INSERTION;  Surgeon: Jama Cordella MATSU, MD;  Location: [facility];  Service: Cardiovascular;  Laterality: Right;   HIP ARTHROPLASTY Left 09/29/2021   Procedure: ARTHROPLASTY BIPOLAR HIP (HEMIARTHROPLASTY);  Surgeon: Leora Lynwood SAUNDERS, MD;  Location: [facility];  Service: Orthopedics;  Laterality: Left;   HIP ARTHROPLASTY Right 12/14/2021   Procedure: ARTHROPLASTY BIPOLAR HIP (HEMIARTHROPLASTY);  Surgeon: Tobie Priest, MD;  Location: [facility];  Service: Orthopedics;  Laterality: Right;   INCISION AND DRAINAGE HIP Left  11/15/2021   Procedure: IRRIGATION AND DEBRIDEMENT LEFT HIP WOUND;  Surgeon: Marchia Drivers, MD;  Location: [facility];  Service: Orthopedics;  Laterality: Left;   PACEMAKER LEADLESS INSERTION N/A 03/09/2024   Procedure: PACEMAKER LEADLESS INSERTION;  Surgeon: Nancey Eulas BRAVO, MD;  Location: [facility];  Service: Cardiovascular;  Laterality: N/A;   QUADRICEPS TENDON REPAIR Right 10/16/2021   Procedure: REPAIR QUADRICEP TENDON;  Surgeon: Doll Skates, MD;  Location: [facility];  Service: Orthopedics;  Laterality: Right;   ROTATOR CUFF REPAIR Right 2004   SHOULDER CLOSED REDUCTION Right 2004   UPPER EXTREMITY VENOGRAPHY Left 02/15/2018   Procedure: UPPER EXTREMITY VENOGRAPHY;  Surgeon: Jama Cordella MATSU, MD;  Location: [facility];  Service: Cardiovascular;  Laterality: Left;    Social History   Socioeconomic History   Marital status: Married    Spouse name: Redell   Number of children: 0   Years of education: Not on file   Highest education level: Some college, no degree  Occupational History   Occupation: Scientist, water quality  Tobacco Use   Smoking status: Never   Smokeless tobacco: Never  Vaping Use   Vaping status: Never Used  Substance and Sexual Activity   Alcohol use: No   Drug use: Never   Sexual activity: Yes    Partners: Male  Other Topics Concern   Not on file  Social History Narrative   Not on file   Social Drivers of Health   Financial Resource Strain: Low Risk  (04/06/2024)   Received from [facility]   Overall Financial Resource Strain (CARDIA)    Difficulty of Paying Living Expenses: Not hard at all  Food Insecurity: No Food Insecurity (05/01/2024)   Hunger Vital Sign    Worried About Running Out of Food in the Last Year: Never true    Ran Out of Food in the Last Year: Never true  Transportation Needs: No Transportation Needs (05/01/2024)   PRAPARE - Administrator, Civil Service (Medical): No    Lack of  Transportation (Non-Medical): No  Physical Activity: Unknown (01/03/2019)   Exercise Vital Sign    Days of Exercise per Week: 3 days    Minutes of Exercise per Session: Not on file  Stress: No Stress Concern Present (03/07/2018)   [employer] of Occupational Health - Occupational Stress Questionnaire    Feeling of Stress : Not at all  Social Connections: Moderately Isolated (05/01/2024)   Social Connection and Isolation Panel    Frequency of Communication with Friends and Family: More than three times a week    Frequency of Social Gatherings with Friends and Family: Twice a week    Attends Religious Services: Never    Database administrator or Organizations: No    Attends Banker Meetings: Never    Marital Status: Married  Catering manager Violence: Not At Risk (05/01/2024)   Humiliation, Afraid, Rape, and Kick questionnaire    Fear of Current  or Ex-Partner: No    Emotionally Abused: No    Physically Abused: No    Sexually Abused: No    Family History  Problem Relation Age of Onset   Emphysema Mother    COPD Mother    Cancer Father    Cancer Paternal Aunt    Diabetes Sister    Hypertension Sister    Eczema Sister    Diabetes Brother    Hypertension Brother    Cardiomyopathy Brother    Alcohol abuse Brother    Allergies  Allergen Reactions   Enalapril Hives and Other (See Comments)    Angioedema face.   Ivp Dye [Iodinated Contrast Media] Hives   Lisinopril Shortness Of Breath   I? Current Facility-Administered Medications  Medication Dose Route Frequency Provider Last Rate Last Admin   acetaminophen  (TYLENOL ) tablet 650 mg  650 mg Oral Q6H PRN Wouk, Devaughn Sayres, MD       albuterol  (PROVENTIL ) (2.5 MG/3ML) 0.083% nebulizer solution 3 mL  3 mL Inhalation Q6H PRN Wouk, Devaughn Sayres, MD       Chlorhexidine  Gluconate Cloth 2 % PADS 6 each  6 each Topical Daily Wouk, Devaughn Sayres, MD       heparin  injection 5,000 Units  5,000 Units Subcutaneous Q8H Garba,  Mohammad L, MD       HYDROmorphone  (DILAUDID ) injection 0.5 mg  0.5 mg Intravenous Q2H PRN Garba, Mohammad L, MD       insulin  aspart (novoLOG ) injection 0-5 Units  0-5 Units Subcutaneous QHS Sim Emery CROME, MD   3 Units at 05/01/24 2240   insulin  aspart (novoLOG ) injection 0-9 Units  0-9 Units Subcutaneous TID WC Sim Emery CROME, MD   5 Units at 05/02/24 1245   insulin  glargine (LANTUS ) injection 10 Units  10 Units Subcutaneous QHS Wouk, Devaughn Sayres, MD       ondansetron  (ZOFRAN ) tablet 4 mg  4 mg Oral Q6H PRN Sim Emery CROME, MD       Or   ondansetron  (ZOFRAN ) injection 4 mg  4 mg Intravenous Q6H PRN Sim Emery CROME, MD       oxyCODONE  (Oxy IR/ROXICODONE ) immediate release tablet 5 mg  5 mg Oral Q6H PRN Kandis Devaughn Sayres, MD   5 mg at 05/02/24 0813   sodium chloride  flush (NS) 0.9 % injection 10-40 mL  10-40 mL Intracatheter Q12H Wouk, Devaughn Sayres, MD   10 mL at 05/02/24 1250   sodium chloride  flush (NS) 0.9 % injection 10-40 mL  10-40 mL Intracatheter PRN Wouk, Devaughn Sayres, MD       NOREEN ON 05/03/2024] vancomycin  (VANCOREADY) IVPB 1000 mg/200 mL  1,000 mg Intravenous Q M,W,F-HD Zeigler, Dustin G, RPH         Abtx:  Anti-infectives (From admission, onward)    Start     Dose/Rate Route Frequency Ordered Stop   05/03/24 1200  vancomycin  (VANCOREADY) IVPB 1000 mg/200 mL        1,000 mg 200 mL/hr over 60 Minutes Intravenous Every M-W-F (Hemodialysis) 05/02/24 1107     05/02/24 1200  vancomycin  (VANCOREADY) IVPB 1750 mg/350 mL        1,750 mg 175 mL/hr over 120 Minutes Intravenous  Once 05/02/24 1107 05/02/24 1451       REVIEW OF SYSTEMS:  Const: negative fever, negative chills, negative weight loss Eyes: negative diplopia or visual changes, negative eye pain ENT: negative coryza, negative sore throat Resp: negative cough, hemoptysis, ++dyspnea Cards: negative for chest pain, palpitations, ++lower extremity  edema GU: negative for frequency, dysuria and hematuria GI:  Negative for abdominal pain, diarrhea, bleeding, constipation Skin: easy bruising MS:  weakness Neurolo:negative for headaches, dizziness, vertigo, memory problems  Psych: negative for feelings of anxiety, depression  Endocrine: , diabetes Allergy/Immunology- ACEI Objective:  VITALS:  BP 132/69 (BP Location: Right Arm)   Pulse 61   Temp 97.6 F (36.4 C) (Oral)   Resp 16   Ht 5' 6 (1.676 m)   Wt 85.3 kg   SpO2 94%   BMI 30.35 kg/m  LDA HD cath rt side PHYSICAL EXAM:  General: Alert, cooperative, no distress, appears stated age. Moon face Head: Normocephalic, without obvious abnormality, atraumatic. Eyes: Conjunctivae clear, anicteric sclerae. Pupils are equal ENT Nares normal. No drainage or sinus tenderness. Lips, mucosa, and tongue normal. No Thrush Neck: Supple, symmetrical, no adenopathy, thyroid : non tender no carotid bruit and no JVD. Back: No CVA tenderness. Lungs: b/l air entry- crepts present. Heart: Regular rate and rhythm, no murmur, rub or gallop. Abdomen: Soft, non-tender,not distended. Bowel sounds normal. No masses Extremities: livedo reticularis shins Both feet cyanotic erythematous non blanching skin Vasculitis looking         Skin: as above Lymph: Cervical, supraclavicular normal. Neurologic: Grossly non-focal Pertinent Labs Lab Results CBC    Component Value Date/Time   WBC 5.9 05/02/2024 0518   RBC 4.36 05/02/2024 0518   HGB 13.7 05/02/2024 0518   HCT 43.6 05/02/2024 0518   PLT 105 (L) 05/02/2024 0518   MCV 100.0 05/02/2024 0518   MCH 31.4 05/02/2024 0518   MCHC 31.4 05/02/2024 0518   RDW 17.5 (H) 05/02/2024 0518   LYMPHSABS 0.7 03/22/2024 0503   MONOABS 0.5 03/22/2024 0503   EOSABS 0.1 03/22/2024 0503   BASOSABS 0.1 03/22/2024 0503       Latest Ref Rng & Units 05/02/2024    5:18 AM 05/01/2024    1:08 PM 03/24/2024    7:41 AM  CMP  Glucose 70 - 99 mg/dL 775  867  670   BUN 6 - 20 mg/dL 58  39  50   Creatinine 0.44 - 1.00 mg/dL  3.94  5.15  2.87   Sodium 135 - 145 mmol/L 136  138  134   Potassium 3.5 - 5.1 mmol/L 4.8  3.4  5.2   Chloride 98 - 111 mmol/L 92  93  92   CO2 22 - 32 mmol/L 24  29  23    Calcium  8.9 - 10.3 mg/dL 7.6  7.7  9.2   Total Protein 6.5 - 8.1 g/dL 5.8     Total Bilirubin 0.0 - 1.2 mg/dL 1.5     Alkaline Phos 38 - 126 U/L 94     AST 15 - 41 U/L 11     ALT 0 - 44 U/L 17         Microbiology: Recent Results (from the past 240 hours)  Blood culture (routine x 2)     Status: None (Preliminary result)   Collection Time: 05/01/24  4:39 PM   Specimen: BLOOD RIGHT FOREARM  Result Value Ref Range Status   Specimen Description   Final    BLOOD RIGHT FOREARM Performed at [facility], [address] [place], [place] [address]    Special Requests   Final    BOTTLES DRAWN AEROBIC AND ANAEROBIC Blood Culture results may not be optimal due to an inadequate volume of blood received in culture bottles Performed at [facility], [address] [place], [place] [address]    Culture  Setup Time   Final    GRAM POSITIVE COCCI AEROBIC BOTTLE ONLY Organism ID to follow CRITICAL RESULT CALLED TO, READ BACK BY AND VERIFIED WITH: KRISTIN MERRILL, PHARMD AT 0740 ON 05/02/24 BY GM GRAM STAIN REVIEWED-AGREE WITH RESULT DRT Performed at [facility], [address] [place], [place] [address]    Culture GRAM POSITIVE COCCI  Final   Report Status PENDING  Incomplete  Blood Culture ID Panel (Reflexed)     Status: Abnormal   Collection Time: 05/01/24  4:39 PM  Result Value Ref Range Status   Enterococcus faecalis NOT DETECTED NOT DETECTED Final   Enterococcus Faecium NOT DETECTED NOT DETECTED Final   Listeria monocytogenes NOT DETECTED NOT DETECTED Final   Staphylococcus species DETECTED (A) NOT DETECTED Final    Comment: CRITICAL RESULT CALLED TO, READ BACK BY AND VERIFIED WITH: KRISTIN MERRILL, PHARMD AT 0740 ON 05/02/24 BY GM    Staphylococcus aureus (BCID) NOT DETECTED NOT DETECTED  Final   Staphylococcus epidermidis NOT DETECTED NOT DETECTED Final   Staphylococcus lugdunensis NOT DETECTED NOT DETECTED Final   Streptococcus species NOT DETECTED NOT DETECTED Final   Streptococcus agalactiae NOT DETECTED NOT DETECTED Final   Streptococcus pneumoniae NOT DETECTED NOT DETECTED Final   Streptococcus pyogenes NOT DETECTED NOT DETECTED Final   A.calcoaceticus-baumannii NOT DETECTED NOT DETECTED Final   Bacteroides fragilis NOT DETECTED NOT DETECTED Final   Enterobacterales NOT DETECTED NOT DETECTED Final   Enterobacter cloacae complex NOT DETECTED NOT DETECTED Final   Escherichia coli NOT DETECTED NOT DETECTED Final   Klebsiella aerogenes NOT DETECTED NOT DETECTED Final   Klebsiella oxytoca NOT DETECTED NOT DETECTED Final   Klebsiella pneumoniae NOT DETECTED NOT DETECTED Final   Proteus species NOT DETECTED NOT DETECTED Final   Salmonella species NOT DETECTED NOT DETECTED Final   Serratia marcescens NOT DETECTED NOT DETECTED Final   Haemophilus influenzae NOT DETECTED NOT DETECTED Final   Neisseria meningitidis NOT DETECTED NOT DETECTED Final   Pseudomonas aeruginosa NOT DETECTED NOT DETECTED Final   Stenotrophomonas maltophilia NOT DETECTED NOT DETECTED Final   Candida albicans NOT DETECTED NOT DETECTED Final   Candida auris NOT DETECTED NOT DETECTED Final   Candida glabrata NOT DETECTED NOT DETECTED Final   Candida krusei NOT DETECTED NOT DETECTED Final   Candida parapsilosis NOT DETECTED NOT DETECTED Final   Candida tropicalis NOT DETECTED NOT DETECTED Final   Cryptococcus neoformans/gattii NOT DETECTED NOT DETECTED Final    Comment: Performed at [facility], [address] [place], [place] [address]  Blood culture (routine x 2)     Status: None (Preliminary result)   Collection Time: 05/01/24  5:22 PM   Specimen: BLOOD  Result Value Ref Range Status   Specimen Description BLOOD BLOOD RIGHT HAND  Final   Special Requests   Final    BOTTLES DRAWN  AEROBIC ONLY Blood Culture results may not be optimal due to an inadequate volume of blood received in culture bottles   Culture   Final    NO GROWTH < 24 HOURS Performed at [facility], [address] [place], [place] [address]    Report Status PENDING  Incomplete    Lines and Device Date on insertion # of days DC  Central line     Foley     ETT       IMAGING RESULTS: I have personally reviewed the films ? 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  No clear infectious cause..  Cellulitis unlikely.  This does not look like infective endocarditis or vegetations the valve but still would get a 2D echo. The staph species in the blood culture 1 out of 2 sets is likely a skin contaminant. Cholesterol emboli or fat emboli also can present like this Calciphylaxis ???  Will still check for Lyme, ASO titer Patient will need skin biopsy Continue vancomycin  for now  End-stage renal disease on dialysis  Insulin -dependent diabetes mellitus  Hypertension  Bilateral total hip replacement H/o Rt knee surgery  Secondary hyperparathyroidism status post heart parathyroidectomy ? I have personally spent  -75--minutes involved in face-to-face and non-face-to-face activities for this patient on the day of the visit. Professional time spent includes the following activities: Preparing to see the patient (review of tests), Obtaining and/or reviewing separately obtained history (admission/discharge record), Performing a medically appropriate examination and/or evaluation , Ordering medications/tests/procedures, referring and communicating with other health care professionals, Documenting clinical  information in the EMR, Independently interpreting results (not separately reported), Communicating results to the patient/family/caregiver, Counseling and educating the patient/family/caregiver and Care coordination (not separately reported).    ________________________________________________ Discussed with patient, requesting provider Note:  This document was prepared using Dragon voice recognition software and may include unintentional dictation errors.

## 2024-05-02 NOTE — Progress Notes (Addendum)
 Pharmacy Antibiotic Note  [name] is a 58 y.o. female admitted on 05/01/2024 with bilateral foot pain and edema.  Blood cultures have returned with Staphylococcus species.  She is a hemodialysis patient with HD catheter.  Pharmacy has been consulted for vancomycin  dosing.  Today, 05/02/2024 Day #1 vancomycin  Renal: ESRD on HD (MWF) WBC WNL Afebrile 9/1 blood cultures with GPC on 1 of 3 bottles.  BCID detects Staphylococcus species (NOT S. Aureus) U/S negative for DVT CT angio: scattered atherosclerotic disease  Plan: Vancomycin  1750 IV x 1 then 1gm IV qHD Pre-HD vancomycin  level 15-25 mcg/mL Since patient has not received antibiotics this admit. To assist with determining significance of the GPC in one bottle from the 9/1 blood cx, suggest repeating blood culture prior to starting vancomycin   Ordered by hospitalist Check pre-HD if remains on vancomycin  for > 5-7 days  Height: 5' 6 (167.6 cm) Weight: 85.3 kg (188 lb 0.8 oz) IBW/kg (Calculated) : 59.3  Temp (24hrs), Avg:98 F (36.7 C), Min:97.6 F (36.4 C), Max:98.5 F (36.9 C)  Recent Labs  Lab 05/01/24 1308 05/01/24 1639 05/02/24 0518  WBC 7.5  --  5.9  CREATININE 4.84*  --  6.05*  LATICACIDVEN  --  1.8  --     Estimated Creatinine Clearance: 11 mL/min (A) (by C-G formula based on SCr of 6.05 mg/dL (H)).    Allergies  Allergen Reactions   Enalapril Hives and Other (See Comments)    Angioedema face.   Ivp Dye [Iodinated Contrast Media] Hives   Lisinopril Shortness Of Breath    Antimicrobials this admission: 9/2 vancomycin   >>    Thank you for allowing pharmacy to be a part of this patient's care.  Ivery Michalski, PharmD, BCPS, BCIDP Work Cell: [phone] 05/02/2024 10:29 AM

## 2024-05-02 NOTE — Progress Notes (Signed)
 Pt receives outpt HD at [facility] on MWF. Navigator following to assist with any HD needs.  Suzen Satchel Dialysis Navigator [phone]

## 2024-05-02 NOTE — Plan of Care (Signed)
  Problem: Education: Goal: Knowledge of General Education information will improve Description: Including pain rating scale, medication(s)/side effects and non-pharmacologic comfort measures Outcome: Progressing   Problem: Clinical Measurements: Goal: Ability to maintain clinical measurements within normal limits will improve Outcome: Progressing Goal: Will remain free from infection Outcome: Progressing   Problem: Nutrition: Goal: Adequate nutrition will be maintained Outcome: Progressing   Problem: Coping: Goal: Level of anxiety will decrease Outcome: Progressing   

## 2024-05-03 ENCOUNTER — Encounter: Admitting: Cardiology

## 2024-05-03 DIAGNOSIS — M79604 Pain in right leg: Secondary | ICD-10-CM | POA: Diagnosis not present

## 2024-05-03 DIAGNOSIS — L959 Vasculitis limited to the skin, unspecified: Secondary | ICD-10-CM

## 2024-05-03 DIAGNOSIS — M7989 Other specified soft tissue disorders: Secondary | ICD-10-CM

## 2024-05-03 DIAGNOSIS — M79605 Pain in left leg: Secondary | ICD-10-CM

## 2024-05-03 LAB — CBC
HCT: 41.8 % (ref 36.0–46.0)
Hemoglobin: 13 g/dL (ref 12.0–15.0)
MCH: 31.3 pg (ref 26.0–34.0)
MCHC: 31.1 g/dL (ref 30.0–36.0)
MCV: 100.7 fL — ABNORMAL HIGH (ref 80.0–100.0)
Platelets: 131 K/uL — ABNORMAL LOW (ref 150–400)
RBC: 4.15 MIL/uL (ref 3.87–5.11)
RDW: 17.8 % — ABNORMAL HIGH (ref 11.5–15.5)
WBC: 13.1 K/uL — ABNORMAL HIGH (ref 4.0–10.5)
nRBC: 0 % (ref 0.0–0.2)

## 2024-05-03 LAB — BASIC METABOLIC PANEL WITH GFR
Anion gap: 22 — ABNORMAL HIGH (ref 5–15)
BUN: 83 mg/dL — ABNORMAL HIGH (ref 6–20)
CO2: 23 mmol/L (ref 22–32)
Calcium: 8.1 mg/dL — ABNORMAL LOW (ref 8.9–10.3)
Chloride: 89 mmol/L — ABNORMAL LOW (ref 98–111)
Creatinine, Ser: 7.38 mg/dL — ABNORMAL HIGH (ref 0.44–1.00)
GFR, Estimated: 6 mL/min — ABNORMAL LOW (ref >60)
Glucose, Bld: 292 mg/dL — ABNORMAL HIGH (ref 70–99)
Potassium: 4.8 mmol/L (ref 3.5–5.1)
Sodium: 134 mmol/L — ABNORMAL LOW (ref 135–145)

## 2024-05-03 LAB — ECHOCARDIOGRAM COMPLETE
AR max vel: 0.84 cm2
AV Area VTI: 0.81 cm2
AV Area mean vel: 0.76 cm2
AV Mean grad: 13 mmHg
AV Peak grad: 18.5 mmHg
Ao pk vel: 2.15 m/s
Area-P 1/2: 4.06 cm2
Height: 66 in
MV VTI: 0.54 cm2
S' Lateral: 4.5 cm
Weight: 3008.84 [oz_av]

## 2024-05-03 LAB — CULTURE, BLOOD (ROUTINE X 2)

## 2024-05-03 LAB — GLUCOSE, CAPILLARY: Glucose-Capillary: 206 mg/dL — ABNORMAL HIGH (ref 70–99)

## 2024-05-03 LAB — HEPATITIS C ANTIBODY: HCV Ab: NONREACTIVE

## 2024-05-03 MED ORDER — INSULIN ASPART 100 UNIT/ML IJ SOLN
3.0000 [IU] | Freq: Three times a day (TID) | INTRAMUSCULAR | Status: DC
  Administered 2024-05-03: 3 [IU] via SUBCUTANEOUS
  Filled 2024-05-03: qty 0.03

## 2024-05-03 MED ORDER — HEPARIN SODIUM (PORCINE) 1000 UNIT/ML DIALYSIS: 1000.0000 [IU] | INTRAMUSCULAR | Status: DC | PRN

## 2024-05-03 MED ORDER — OXYCODONE HCL 5 MG PO TABS: 5.0000 mg | ORAL_TABLET | Freq: Two times a day (BID) | ORAL | 0 refills | Status: DC | PRN

## 2024-05-03 MED ORDER — HEPARIN SODIUM (PORCINE) 1000 UNIT/ML IJ SOLN
INTRAMUSCULAR | Status: AC
  Filled 2024-05-03: qty 1

## 2024-05-03 MED ORDER — INSULIN GLARGINE 100 UNIT/ML ~~LOC~~ SOLN
13.0000 [IU] | Freq: Every day | SUBCUTANEOUS | Status: DC
  Filled 2024-05-03: qty 0.13

## 2024-05-03 MED ORDER — ALTEPLASE 2 MG IJ SOLR: 2.0000 mg | Freq: Once | INTRAMUSCULAR | Status: DC | PRN

## 2024-05-03 MED ORDER — VANCOMYCIN HCL 1000 MG/200ML IV SOLN: 1000.0000 mg | INTRAVENOUS | Status: AC

## 2024-05-03 NOTE — Inpatient Diabetes Management (Signed)
 Inpatient Diabetes Program Recommendations  AACE/ADA: New Consensus Statement on Inpatient Glycemic Control   Target Ranges:  Prepandial:   less than 140 mg/dL      Peak postprandial:   less than 180 mg/dL (1-2 hours)      Critically ill patients:  140 - 180 mg/dL    Latest Reference Range & Units 05/02/24 08:02 05/02/24 12:03 05/02/24 16:35 05/02/24 18:53 05/02/24 21:01  Glucose-Capillary 70 - 99 mg/dL 675 (H) 702 (H) 557 (H) 430 (H) 301 (H)   Review of Glycemic Control  Diabetes history: DM2 Outpatient Diabetes medications: Novolog  14 units TID Current orders for Inpatient glycemic control: Novolog  0-20 units TID with meals, Novolog  0-5 units at bedtime, Lantus  10 units daily at bedtime.   Inpatient Diabetes Program Recommendations: Glucose at 0158 this morning was 292 - pt currently on HD. Would consider increasing Glargine to 13 units daily at bedtime and starting Novolog  3 units TID with meals.   Thanks,  Lavanda Search, RN, MSN, [facility]  Inpatient Diabetes Coordinator  Pager [phone] (8a-5p)

## 2024-05-03 NOTE — Progress Notes (Signed)
 Date of Admission:  05/01/2024      ID: [name] is a 58 y.o. female  Principal Problem:   Leg swelling Active Problems:   Uncontrolled hypertension   Dyslipidemia   ESRD on dialysis (HCC)   Type 1 diabetes mellitus (HCC)   CHB (complete heart block) (HCC)   History of CVA (cerebrovascular accident)   PAD (peripheral artery disease) (HCC)   Pain in both lower extremities  [name] is a 58 y.o. with a history of IDDS, HTN, ESRD on dilaysis, secondary hyperparathyroidism s/p parathyroidectomy, b/l THA presents to the ED on 05/01/2024 brought in by private vehicle with complaints of painful leg movements bilaterally with color discoloration which has been going on for the past few days.  She says the legs have been swollen for the few weeks now especially after her recent episode of pneumonia  2 different antibiotics  azithromycin  and Keflex .  She was then hospitalized between 03/02/2024 until 03/10/2024 for shortness of breath which she thought was asthma but was found to be in complete heart block.  She had a leadless pacemaker placement. She was back in the hospital 03/19/2024 until 03/21/2024 for joint pain especially swollen right wrist and 1st and 2nd metacarpal joints.  She did not want the workup for this.  So she went home but came back the very next day on 03/22/2024 because of swelling in feet especially the left ankle.    She was started on IV Solu-Medrol  and then placed on prednisone  and was discharged on prednisone . She also saw pulmonologist as outpatient and she did workup for vasculitis and other autoimmune conditions and that was negative.  She was given prednisone .  The joint swelling in the hands improved.   She came  back on 9 /1 because of worsening changes in her feet Vitals in the ED BP of 112/68, temperature 98.5, pulse 56, sats of 100% Labs revealed a WBC of 7.5, Hb of 13.9, platelet 148 and creatinine of 4.84. I am asked to see the patient further skin changes in  the legs   Also seen by vein and vascular- CT angio legs shows vascular changes to the rt lower extremity- seen by VV but no intervention  Subjective: Patient is on hemodialysis. Has Unna boot on both her legs.  As recommended by vascular.   Medications:   Chlorhexidine  Gluconate Cloth  6 each Topical Daily   heparin   5,000 Units Subcutaneous Q8H   insulin  aspart  0-20 Units Subcutaneous TID WC   insulin  aspart  0-5 Units Subcutaneous QHS   insulin  aspart  3 Units Subcutaneous TID WC   insulin  glargine  13 Units Subcutaneous QHS   sodium chloride  flush  10-40 mL Intracatheter Q12H    Objective: Vital signs in last 24 hours: Patient Vitals for the past 24 hrs:  BP Temp Temp src Pulse Resp SpO2 Weight  05/03/24 1000 111/67 -- -- (!) 53 15 100 % --  05/03/24 0930 101/62 -- -- (!) 52 13 98 % --  05/03/24 0900 103/65 -- -- (!) 56 14 99 % --  05/03/24 0830 118/63 -- -- (!) 54 18 100 % --  05/03/24 0808 117/74 -- -- 62 (!) 22 97 % --  05/03/24 0800 117/74 97.6 F (36.4 C) -- 60 19 97 % 85.3 kg  05/03/24 0720 124/82 98.7 F (37.1 C) -- (!) 59 17 93 % --  05/03/24 0422 119/67 (!) 97.4 F (36.3 C) Oral 95 18 100 % --  05/02/24 2321 100/65 -- -- (!) 59 -- -- --  05/02/24 2102 (!) 95/57 97.7 F (36.5 C) Oral 68 20 97 % --  05/02/24 1636 114/73 97.9 F (36.6 C) -- 95 17 100 % --     Lines and Device Date on insertion # of days DC  Engineer, technical sales     ETT       PHYSICAL EXAM:  General: Alert, cooperative, no distress, appears stated age.  Lungs: Clear to auscultation bilaterally. No Wheezing or Rhonchi. No rales. Heart: Regular rate and rhythm, no murmur, rub or gallop. Abdomen: Soft, non-tender,not distended. Bowel sounds normal. No masses Extremities: Unna boot both legs              Skin: No rashes or lesions. Or bruising Lymph: Cervical, supraclavicular normal. Neurologic: Grossly non-focal  Lab Results    Latest Ref Rng & Units  05/03/2024    1:58 AM 05/02/2024    5:18 AM 05/01/2024    1:08 PM  CBC  WBC 4.0 - 10.5 K/uL 13.1  5.9  7.5   Hemoglobin 12.0 - 15.0 g/dL 86.9  86.2  86.0   Hematocrit 36.0 - 46.0 % 41.8  43.6  43.1   Platelets 150 - 400 K/uL 131  105  148        Latest Ref Rng & Units 05/03/2024    1:58 AM 05/02/2024    5:18 AM 05/01/2024    1:08 PM  CMP  Glucose 70 - 99 mg/dL 707  775  867   BUN 6 - 20 mg/dL 83  58  39   Creatinine 0.44 - 1.00 mg/dL 2.61  3.94  5.15   Sodium 135 - 145 mmol/L 134  136  138   Potassium 3.5 - 5.1 mmol/L 4.8  4.8  3.4   Chloride 98 - 111 mmol/L 89  92  93   CO2 22 - 32 mmol/L 23  24  29    Calcium  8.9 - 10.3 mg/dL 8.1  7.6  7.7   Total Protein 6.5 - 8.1 g/dL  5.8    Total Bilirubin 0.0 - 1.2 mg/dL  1.5    Alkaline Phos 38 - 126 U/L  94    AST 15 - 41 U/L  11    ALT 0 - 44 U/L  17        Microbiology: Blood culture 05/01/2024 Staph hemolyticus isolated from 1 set in the aerobic bottle.  The second set of blood culture is no growth. The repeat blood cultures from 9-25 also got no growth so far Studies/Results: ECHOCARDIOGRAM COMPLETE Result Date: 05/03/2024    ECHOCARDIOGRAM REPORT   Patient Name:   [name] Date of Exam: 05/02/2024 Medical Rec #:  [id number]    Height:       66.0 in Accession #:    [id number]   Weight:       188.1 lb Date of Birth:  [birth year]    BSA:          1.948 m Patient Age:    57 years     BP:           112/68 mmHg Patient Gender: F            HR:           65 bpm. Exam Location:  [facility] Procedure: 2D Echo, Cardiac Doppler and Color Doppler (Both Spectral and Color  Flow Doppler were utilized during procedure). 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                  Pacemaker, Stroke, Signs/Symptoms:Shortness of Breath; Risk                  Factors:Hypertension and Diabetes. Chronic kidney disease.  Sonographer:     Carl Coma RDCS Referring Phys:  [id number] DEVAUGHN SAYRES TNLX Diagnosing Phys: Lonni Hanson MD IMPRESSIONS  1. Left ventricular ejection fraction, by estimation, is 45 to 50%. The left ventricle has mildly decreased function. The left ventricle demonstrates regional wall motion abnormalities (see scoring diagram/findings for description). The left ventricular  internal cavity size was mildly dilated. Left ventricular diastolic parameters are consistent with Grade III diastolic dysfunction (restrictive). Elevated left atrial pressure. There is mild hypokinesis of the left ventricular, entire anteroseptal wall.  2. Right ventricular systolic function is mildly reduced. The right ventricular size is mildly enlarged. There is moderately elevated pulmonary artery systolic pressure.  3. Left atrial size was mildly dilated.  4. The mitral valve is degenerative. Moderate to severe mitral valve regurgitation. No evidence of mitral stenosis. Moderate mitral annular calcification.  5. The tricuspid valve is abnormal. Tricuspid valve regurgitation is moderate to severe.  6. The aortic valve is tricuspid. There is moderate thickening of the aortic valve. Aortic valve regurgitation is not visualized. Moderate aortic valve stenosis with dimensionless index of 0.29. Aortic valve area, by VTI measures 0.81 cm. Aortic valve mean gradient measures 13.0 mmHg.  7. The inferior vena cava is dilated in size with <50% respiratory variability, suggesting right atrial pressure of 15 mmHg. Conclusion(s)/Recommendation(s): No evidence of valvular vegetations on this transthoracic echocardiogram. Consider a transesophageal echocardiogram to exclude infective endocarditis if clinically indicated. FINDINGS  Left Ventricle: Left ventricular ejection fraction, by estimation, is 45 to 50%. The left ventricle has mildly decreased function. The left ventricle demonstrates regional wall motion abnormalities. Mild hypokinesis of the left ventricular, entire anteroseptal wall. The left ventricular  internal cavity size was mildly dilated. There is borderline left ventricular hypertrophy. Left ventricular diastolic parameters are consistent with Grade III diastolic dysfunction (restrictive). Elevated left atrial pressure. Right Ventricle: The right ventricular size is mildly enlarged. No increase in right ventricular wall thickness. Right ventricular systolic function is mildly reduced. There is moderately elevated pulmonary artery systolic pressure. The tricuspid regurgitant velocity is 3.16 m/s, and with an assumed right atrial pressure of 15 mmHg, the estimated right ventricular systolic pressure is 54.9 mmHg. Left Atrium: Left atrial size was mildly dilated. Right Atrium: Right atrial size was normal in size. Pericardium: There is no evidence of pericardial effusion. Mitral Valve: The mitral valve is degenerative in appearance. There is mild thickening of the mitral valve leaflet(s). Moderate mitral annular calcification. Moderate to severe mitral valve regurgitation. No evidence of mitral valve stenosis. Tricuspid Valve: The tricuspid valve is abnormal. Tricuspid valve regurgitation is moderate to severe. The aortic valve is tricuspid. There is moderate thickening of the aortic valve. Aortic valve regurgitation is not visualized. Moderate aortic stenosis is present. Pulmonic Valve: The pulmonic valve was thickened with mildly decreased excursion. Pulmonic valve regurgitation is trivial. No evidence of pulmonic stenosis. Aorta: The aortic root and ascending aorta are structurally normal, with no evidence of dilitation. Venous: The inferior vena cava is dilated in size with less than 50% respiratory variability, suggesting right  atrial pressure of 15 mmHg. IAS/Shunts: No atrial level shunt detected by color flow Doppler.  LEFT VENTRICLE PLAX 2D LVIDd:         5.40 cm   Diastology LVIDs:         4.50 cm   LV e' medial:    4.14 cm/s LV PW:         0.90 cm   LV E/e' medial:  35.9 LV IVS:        0.90 cm   LV e'  lateral:   9.50 cm/s LVOT diam:     1.90 cm   LV E/e' lateral: 15.6 LV SV:         31 LV SV Index:   16 LVOT Area:     2.84 cm  RIGHT VENTRICLE            IVC RV Basal diam:  5.20 cm    IVC diam: 2.30 cm RV S prime:     8.27 cm/s TAPSE (M-mode): 1.4 cm LEFT ATRIUM             Index        RIGHT ATRIUM           Index LA diam:        5.10 cm 2.62 cm/m   RA Area:     15.30 cm LA Vol (A2C):   81.9 ml 42.04 ml/m  RA Volume:   38.50 ml  19.76 ml/m LA Vol (A4C):   72.0 ml 36.96 ml/m LA Biplane Vol: 77.3 ml 39.68 ml/m  AORTIC VALVE AV Area (Vmax):    0.84 cm AV Area (Vmean):   0.76 cm AV Area (VTI):     0.81 cm AV Vmax:           214.80 cm/s AV Vmean:          159.200 cm/s AV VTI:            0.380 m AV Peak Grad:      18.5 mmHg AV Mean Grad:      13.0 mmHg LVOT Vmax:         64.00 cm/s LVOT Vmean:        42.900 cm/s LVOT VTI:          0.109 m LVOT/AV VTI ratio: 0.29  AORTA Ao Root diam: 2.90 cm Ao Asc diam:  3.00 cm MITRAL VALVE                TRICUSPID VALVE MV Area (PHT): 4.06 cm     TR Peak grad:   39.9 mmHg MV Area VTI:   0.54 cm     TR Vmax:        316.00 cm/s MV VTI:        0.57 m MV Decel Time: 187 msec     SHUNTS MV E velocity: 148.33 cm/s  Systemic VTI:  0.11 m MV A velocity: 57.50 cm/s   Systemic Diam: 1.90 cm MV E/A ratio:  2.58 Lonni End MD Electronically signed by Lonni Hanson MD Signature Date/Time: 05/03/2024/6:56:59 AM    Final    CT Angio Aortobifemoral W and/or Wo Contrast Result Date: 05/01/2024 CLINICAL DATA:  Right leg pain EXAM: CT ANGIOGRAPHY OF ABDOMINAL AORTA WITH ILIOFEMORAL RUNOFF TECHNIQUE: Multidetector CT imaging of the abdomen, pelvis and lower extremities was performed using the standard protocol during bolus administration of intravenous contrast. Multiplanar CT image reconstructions and MIPs were obtained to evaluate the vascular anatomy. RADIATION DOSE REDUCTION: This  exam was performed according to the departmental dose-optimization program which includes automated  exposure control, adjustment of the mA and/or kV according to patient size and/or use of iterative reconstruction technique. CONTRAST:  OMNIPAQUE  IOHEXOL  350 MG/ML SOLN COMPARISON:  None Available. FINDINGS: VASCULAR Aorta: Abdominal aorta demonstrates atherosclerotic calcifications. No significant mural thrombus or soft atherosclerotic plaque is seen. Celiac: Patent without evidence of aneurysm, dissection, vasculitis or significant stenosis. SMA: Patent without evidence of aneurysm, dissection, vasculitis or significant stenosis. Renals: Heavy atherosclerotic calcifications of the renal arteries are noted bilaterally. At least moderate to severe stenoses are noted bilaterally with renal atrophy. IMA: Patent without evidence of aneurysm, dissection, vasculitis or significant stenosis. RIGHT Lower Extremity Inflow: The common and external iliac artery demonstrate mild atherosclerotic calcifications. No focal high-grade stenosis is noted Runoff: Common femoral artery and femoral bifurcation are within normal limits. Scattered atherosclerotic calcifications are seen. Mild stenosis is noted in the proximal popliteal artery just above the knee joint. More marked atherosclerotic calcification is noted in the distal popliteal artery although the popliteal trifurcation is patent. Two vessel runoff via the anterior tibial and peroneal artery is seen. Peroneal artery gives collaterals to the distal posterior tibial artery which demonstrates multiple proximal areas of occlusion. LEFT Lower Extremity Inflow: Common and external iliac artery demonstrate atherosclerotic plaque although this does not appear to be flow limiting and no significant poststenotic dilatation is seen. Runoff: Common femoral artery and femoral bifurcation are widely patent. Scattered atherosclerotic changes are noted within the superficial femoral artery on the left. Similar findings are noted within the popliteal artery with a moderate stenosis  just above the level of the knee joint. The more distal popliteal artery is patent in the popliteal trifurcation is patent as well. Three-vessel runoff to the left ankle is noted with the anterior tibial and posterior tibial continuing into the foot. Veins: No specific venous abnormality is noted although the examination was not timed for venous evaluation. Review of the MIP images confirms the above findings. NON-VASCULAR Lower chest: Mild emphysematous changes are noted. No focal confluent infiltrate or effusion is seen. Hepatobiliary: Fatty infiltration of the liver is noted. The gallbladder is within normal limits. Pancreas: Unremarkable. No pancreatic ductal dilatation or surrounding inflammatory changes. Spleen: Normal in size without focal abnormality. Adrenals/Urinary Tract: Adrenal glands are within normal limits. Kidneys show atrophy bilaterally with significant arterial calcifications and nephrocalcinosis. These changes are consistent with the given clinical history of chronic renal failure. Stomach/Bowel: Scattered mild diverticulosis is noted throughout the colon. No diverticulitis is seen. No obstructive changes are noted. The appendix is within normal limits. Small bowel and stomach are within normal limits. Lymphatic: No lymphadenopathy is identified. Reproductive: Status post hysterectomy. No adnexal masses. Other: No abdominal wall hernia or abnormality. No abdominopelvic ascites. Musculoskeletal: Endplate changes in the lumbar spine are noted consistent with the underlying renal failure. No acute fracture is seen. Bilateral hip replacements are noted. Old inferior pubic rami fractures are seen. No other bony abnormality is noted. Considerable soft tissue edema is noted in the lower extremities below the knees similar to that noted on physical exam. IMPRESSION: VASCULAR Scattered atherosclerotic disease is noted described. Two vessel runoff to the right ankle is noted with three-vessel runoff to  the left ankle. No arterial abnormality to correspond with the physical exam is identified. NON-VASCULAR Changes consistent with end-stage renal failure with renal atrophy and bony changes. Diverticulosis without diverticulitis. Fatty infiltration of the liver. Electronically Signed   By: Oneil Evelyne HERO.D.  On: 05/01/2024 19:17   US  Venous Img Lower Bilateral Result Date: 05/01/2024 EXAM: ULTRASOUND DUPLEX OF THE BILATERAL LOWER EXTREMITY VEINS TECHNIQUE: Duplex ultrasound using B-mode/gray scaled imaging and Doppler spectral analysis and color flow was obtained of the deep venous structures of the bilateral lower extremities. COMPARISON: Bilateral lower extremity venous doppler ultrasound 04/11/2024. CLINICAL HISTORY: Pain and swelling. FINDINGS: Subcutaneous edema is noted below the knee bilaterally. The visualized veins of the lower extremities are patent and free of echogenic thrombus. The veins demonstrate good compressibility with normal color flow study and spectral analysis. IMPRESSION: 1. No evidence of DVT. 2. Subcutaneous edema below the knee bilaterally. 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  No clear infectious cause..  It does not look like typical cellulitis.   2D echo she has mitral regurgitation. With complete heart block and vascular the leg picture should be have to rule out antiphospholipid antibody syndrome though it is pretty remote Also embolic lesions from the heart may be a source of maybe a TEE to look for any mural thrombus or valve  vegetations .Cholesterol emboli or fat emboli also can present like this Calciphylaxis ???  Will still check for Lyme, ASO titer Patient may  need skin biopsy.  We can ask Derm to review the pictures Continue vancomycin  x7 days  The staph hominis in the blood culture 1 out of 2 sets is likely a skin contaminant.  Patient currently has Unna boots in both her legs as recommended by vascular.  Need to observe closely because of the vasculitic like lesions    End-stage renal disease on dialysis   Insulin -dependent diabetes mellitus   Hypertension   Bilateral total hip replacement H/o Rt knee surgery   Secondary hyperparathyroidism status post heart parathyroidectomy   Discussed the management with the patient, hospitalist and brain and vascular NP

## 2024-05-03 NOTE — Consult Note (Addendum)
 WOC Nurse Consult Note: Vascular team is following for assessment and plan of care.  They have requested bilat Una boots be applied.  Pt does not have a recent ABI available and toes and anterior feet are dusky purple prior to the application.   Bilat calves with mod amt edema, patchy areas of red, moist partial thickness wounds to anterior legs. Mod amt clear weeping drainage.  Applied bilat Una boots and Coban.  Right planter foot with black blood blister/callous, .8X.8cm, applied foam dressing to protect the location.   Secure chat message sent to the primary team as follows, Vascular team has requested the patient be followed at the outpatient wound care center after discharge.  This can be arranged by the case manager.  I applied bilat Una boots as their team requested and she will need home health assistance for weekly changes.  Orders provided for bedside nurses as follows: WOC team applied bilat Una boots and will change Q WED while patient is in the hospital.  Thank-you,  Stephane Fought MSN, RN, CWOCN, CWCN-AP, CNS Contact Mon-Fri 0700-1500: [phone]

## 2024-05-03 NOTE — Plan of Care (Signed)

## 2024-05-03 NOTE — Plan of Care (Signed)
  Problem: Education: Goal: Knowledge of General Education information will improve Description: Including pain rating scale, medication(s)/side effects and non-pharmacologic comfort measures Outcome: Progressing   Problem: Health Behavior/Discharge Planning: Goal: Ability to manage health-related needs will improve Outcome: Progressing   Problem: Nutrition: Goal: Adequate nutrition will be maintained Outcome: Progressing   Problem: Coping: Goal: Level of anxiety will decrease Outcome: Progressing   Problem: Pain Managment: Goal: General experience of comfort will improve and/or be controlled Outcome: Progressing   Problem: Safety: Goal: Ability to remain free from injury will improve Outcome: Progressing

## 2024-05-03 NOTE — Progress Notes (Signed)
 Progress Note    05/03/2024 8:05 AM * No surgery found *  Subjective:  [name] is a 58 yo female who presents to [facility] emergency department on 05/02/2024 with chief complaint of bilateral extreme leg pain.  Patient endorses she has been having issues with her lower legs for years in terms of swelling and weeping with discoloration but now has encountered extreme pain with her legs.  She was in the hospital in July for polyarthritis and has a history of peripheral neuropathy as well due to renal failure.  She is a hemodialysis patient Wednesdays and Fridays.  In July she was treated with steroids which helped improve some of her pain and she was started on colchicine  for elevated uric acid.  Patient also endorses that she has had multiple ultrasounds to her complete body bolus CAT scans to her complete body that have ruled out any type of thrombosis.  On exam this morning patient was resting comfortably in the hemodialysis unit.  She has a PermCath to her right chest at this time and hemodialysis is running well.  Her bilateral lower extremities are with +2 edema I am unable to palpate pulses due to swelling.  She has considerable pooling in both of her feet are purple.  She also has skin tears on bilateral lower extremities which she claims are just from bumping into things.  Vascular surgery was consulted to eval.   Vitals:   05/03/24 0422 05/03/24 0720  BP: 119/67 124/82  Pulse: 95 (!) 59  Resp: 18 17  Temp: (!) 97.4 F (36.3 C) 98.7 F (37.1 C)  SpO2: 100% 93%   Physical Exam: Cardiac:  RRR,  Lungs: Lungs are clear on auscultation throughout, no rales rhonchi or wheezing.  Nonlabored breathing. Incisions:  None Extremities: Bilateral upper extremities are normal warm to touch with palpable pulses.  Bilateral lower extremities with +3 to +4 edema from calves to feet with blood pooling noted and complete discoloration of her feet being purple. Abdomen: Positive bowel sounds  throughout, soft, nontender distended. Neurologic: Alert and oriented x 3, answers all questions and follows commands appropriately.  CBC    Component Value Date/Time   WBC 13.1 (H) 05/03/2024 0158   RBC 4.15 05/03/2024 0158   HGB 13.0 05/03/2024 0158   HCT 41.8 05/03/2024 0158   PLT 131 (L) 05/03/2024 0158   MCV 100.7 (H) 05/03/2024 0158   MCH 31.3 05/03/2024 0158   MCHC 31.1 05/03/2024 0158   RDW 17.8 (H) 05/03/2024 0158   LYMPHSABS 0.7 03/22/2024 0503   MONOABS 0.5 03/22/2024 0503   EOSABS 0.1 03/22/2024 0503   BASOSABS 0.1 03/22/2024 0503    BMET    Component Value Date/Time   NA 134 (L) 05/03/2024 0158   K 4.8 05/03/2024 0158   CL 89 (L) 05/03/2024 0158   CO2 23 05/03/2024 0158   GLUCOSE 292 (H) 05/03/2024 0158   BUN 83 (H) 05/03/2024 0158   CREATININE 7.38 (H) 05/03/2024 0158   CALCIUM  8.1 (L) 05/03/2024 0158   GFRNONAA 6 (L) 05/03/2024 0158   GFRAA 8 (L) 12/06/2017 1505    INR    Component Value Date/Time   INR 1.1 12/14/2021 0031     Intake/Output Summary (Last 24 hours) at 05/03/2024 0805 Last data filed at 05/02/2024 0900 Gross per 24 hour  Intake 0 ml  Output --  Net 0 ml     Assessment/Plan:  58 y.o. female who presents to [facility] emergency department for increasing bilateral lower  extremity pain.* No surgery found *   PLAN Due to the patient's swelling and weeping to bilateral lower extremities with skin tears to both bilateral lower extremities vascular surgery recommend therapy wraps to initiate reduction in swelling as well as blood pooling and discoloration to bilateral lower extremities.  Patient recently underwent ultrasounds of bilateral lower extremities on 04/11/2024 and 05/01/2024 both were negative for DVTs as well as reflux.  Patient was started on antibiotics yesterday but her IV infiltrated and therefore she did not get much.  Patient endorses she is getting IV antibiotics during hemodialysis today.  Continue wraps to be changed weekly and if  the patient is ready for discharge prior to leaving the wraps being changed next Wednesday she will need to see wound care clinic for continued therapy.  Patient vascular surgery can see her back in clinic in 6 weeks for follow-up due to vascular insufficiency.  Patient was in agreement with the plan.   DVT prophylaxis: Heparin  with dialysis.   Gwendlyn JONELLE [facility] 05/03/2024 8:05 AM

## 2024-05-03 NOTE — Progress Notes (Signed)
 D/C order noted. Contacted [facility] MWF to be advised of pt and d/c today that the pt should resume care on 05/05/2024. Requested documents faxed to clinic for continuation of care.  Suzen Satchel Dialysis Navigator [phone]

## 2024-05-03 NOTE — Discharge Summary (Signed)
 Physician Discharge Summary  [name] FMW:[id number] DOB: 1966/07/23 DOA: 05/01/2024  PCP: Kennyth Chew, MD  Admit date: 05/01/2024 Discharge date: 05/03/2024  Admitted From: Home Disposition:  Home w home health  Recommendations for Outpatient Follow-up:  Follow up with PCP in 1-2 weeks Ambulatory referral to dermatology Ambulatory referral to the wound clinic Follow-up nephrology as directed  Home Health: Yes PT OT RN Equipment/Devices: Bilateral Unna boots  Discharge Condition: Stable CODE STATUS: Full Diet recommendation: Renal/carb modified  Brief/Interim Summary:  [name] is a 58 y.o. female with medical history significant of disease on hemodialysis Mondays Wednesdays and Fridays, type 1 diabetes, GERD, essential hypertension, diabetic neuropathy, who was dialyzed today but came to the ER complaining of bilateral lower extremity edema and pain.  Patient is having severe pain bilaterally with redness of the feet all the way to the knees.  No fever and no significant white count.  Workup was performed in the ER with suspicion for possible vasculitis rather than cellulitis.  Patient also has had similar episode before which responded to steroids.  At this point she is being admitted with bilateral lower extremity edema and pain with suspicion of possible vasculitic causes.  She has no other complaint.     Discharge Diagnoses:  Principal Problem:   Leg swelling Active Problems:   Uncontrolled hypertension   Dyslipidemia   ESRD on dialysis (HCC)   Type 1 diabetes mellitus (HCC)   CHB (complete heart block) (HCC)   History of CVA (cerebrovascular accident)   PAD (peripheral artery disease) (HCC)   Pain in both lower extremities  # Lower extremity edema, erythema Progressively worsening for past couple of months. With one positive blood culture and some toe lesions that look to possibly be infarcts, bacteremia remain on differential.  1 isolated blood culture felt to be  contaminant however.  Vascular reviewed images.  Does not think this is arterial occlusive problem.  ID consulted.  Patient was on IV vancomycin .  Vascular surgery engaged for consultation.  Patient does have significant vasculopathy and although atypical this could be all chronic venous insufficiency.  He does not have the typical appearance however.  Potential cutaneous vasculitides remain on differential however normal inflammatory markers speak away from this.  At time of discharge pain is well-controlled.  Patient has been receiving IV vancomycin  with dialysis which will continue for 1 additional week.  Unna boots placed by [employer] nurse.  Ambulatory referral to dermatology as well as the wound center on discharge to consider skin biopsy for more definitive diagnosis.     # Bacteremia, ruled out Contaminant   # ESRD On mwf hemodialysis, received yesterday - nephrology consulted.  HD in house.  Vancomycin  with HD as outpatient.    Discharge Instructions  Discharge Instructions     Ambulatory referral to Dermatology   Complete by: As directed    Bilateral lower extremity wounds of unclear etiology   Is there a picture in the patient's chart?: Yes   Ambulatory referral to Wound Clinic   Complete by: As directed    Diet - low sodium heart healthy   Complete by: As directed    Discharge wound care:   Complete by: As directed    Wound care  Until discontinued      Comments: WOC team applied bilat Una boots and will change Q WED while patient is in the hospital.  Foam dressing to right plantar foot, change Q Wed.   Increase activity slowly   Complete by:  As directed       Allergies as of 05/03/2024       Reactions   Enalapril Hives, Other (See Comments)   Angioedema face.   Ivp Dye [iodinated Contrast Media] Hives   Lisinopril Shortness Of Breath   Prednisone  Shortness Of Breath        Medication List     STOP taking these medications    azithromycin  250 MG  tablet Commonly known as: ZITHROMAX    colchicine  0.6 MG tablet       TAKE these medications    albuterol  108 (90 Base) MCG/ACT inhaler Commonly known as: VENTOLIN  HFA Inhale 2 puffs into the lungs every 6 (six) hours as needed for wheezing or shortness of breath.   ipratropium-albuterol  0.5-2.5 (3) MG/3ML Soln Commonly known as: DUONEB Take 3 mLs by nebulization 4 (four) times daily.   NovoLOG  FlexPen 100 UNIT/ML FlexPen Generic drug: insulin  aspart Inject 14 Units into the skin 3 (three) times daily with meals.   oxyCODONE  5 MG immediate release tablet Commonly known as: Oxy IR/ROXICODONE  Take 1-2 tablets (5-10 mg total) by mouth 2 (two) times daily as needed for severe pain (pain score 7-10). What changed:  how much to take reasons to take this   sevelamer  carbonate 800 MG tablet Commonly known as: RENVELA  Take 2,400 mg by mouth 3 (three) times daily with meals.   Trelegy Ellipta 200-62.5-25 MCG/ACT Aepb Generic drug: Fluticasone-Umeclidin-Vilant Inhale 1 puff into the lungs daily.   vancomycin  HCl 1000 MG/200ML Soln Commonly known as: VANCOREADY Inject 200 mLs (1,000 mg total) into the vein every Monday, Wednesday, and Friday with hemodialysis for 3 doses. Will be dosed with dialysis Start taking on: May 05, 2024   Vitamin D  (Ergocalciferol ) 1.25 MG (50000 UNIT) Caps capsule Commonly known as: DRISDOL  Take 1 capsule (50,000 Units total) by mouth every 7 (seven) days. What changed: additional instructions               Discharge Care Instructions  (From admission, onward)           Start     Ordered   05/03/24 0000  Discharge wound care:       Comments: Wound care  Until discontinued      Comments: WOC team applied bilat Una boots and will change Q WED while patient is in the hospital.  Foam dressing to right plantar foot, change Q Wed.   05/03/24 1509            Allergies  Allergen Reactions   Enalapril Hives and Other (See Comments)     Angioedema face.   Ivp Dye [Iodinated Contrast Media] Hives   Lisinopril Shortness Of Breath   Prednisone  Shortness Of Breath    Consultations: Vascular surgery Infectious disease   Procedures/Studies: ECHOCARDIOGRAM COMPLETE Result Date: 05/03/2024    ECHOCARDIOGRAM REPORT   Patient Name:   LINZIE CRISS Date of Exam: 05/02/2024 Medical Rec #:  [id number]    Height:       66.0 in Accession #:    [id number]   Weight:       188.1 lb Date of Birth:  [birth year]    BSA:          1.948 m Patient Age:    57 years     BP:           112/68 mmHg Patient Gender: F            HR:  65 bpm. Exam Location:  [facility] Procedure: 2D Echo, Cardiac Doppler and Color Doppler (Both Spectral and Color            Flow Doppler were utilized during procedure). 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                  Pacemaker, Stroke, Signs/Symptoms:Shortness of Breath; Risk                  Factors:Hypertension and Diabetes. Chronic kidney disease.  Sonographer:     Carl Coma RDCS Referring Phys:  [id number] DEVAUGHN SAYRES TNLX Diagnosing Phys: Lonni Hanson MD IMPRESSIONS  1. Left ventricular ejection fraction, by estimation, is 45 to 50%. The left ventricle has mildly decreased function. The left ventricle demonstrates regional wall motion abnormalities (see scoring diagram/findings for description). The left ventricular  internal cavity size was mildly dilated. Left ventricular diastolic parameters are consistent with Grade III diastolic dysfunction (restrictive). Elevated left atrial pressure. There is mild hypokinesis of the left ventricular, entire anteroseptal wall.  2. Right ventricular systolic function is mildly reduced. The right ventricular size is mildly enlarged. There is moderately elevated pulmonary artery systolic pressure.  3. Left atrial size was mildly dilated.  4. The mitral valve is degenerative. Moderate to severe mitral  valve regurgitation. No evidence of mitral stenosis. Moderate mitral annular calcification.  5. The tricuspid valve is abnormal. Tricuspid valve regurgitation is moderate to severe.  6. The aortic valve is tricuspid. There is moderate thickening of the aortic valve. Aortic valve regurgitation is not visualized. Moderate aortic valve stenosis with dimensionless index of 0.29. Aortic valve area, by VTI measures 0.81 cm. Aortic valve mean gradient measures 13.0 mmHg.  7. The inferior vena cava is dilated in size with <50% respiratory variability, suggesting right atrial pressure of 15 mmHg. Conclusion(s)/Recommendation(s): No evidence of valvular vegetations on this transthoracic echocardiogram. Consider a transesophageal echocardiogram to exclude infective endocarditis if clinically indicated. FINDINGS  Left Ventricle: Left ventricular ejection fraction, by estimation, is 45 to 50%. The left ventricle has mildly decreased function. The left ventricle demonstrates regional wall motion abnormalities. Mild hypokinesis of the left ventricular, entire anteroseptal wall. The left ventricular internal cavity size was mildly dilated. There is borderline left ventricular hypertrophy. Left ventricular diastolic parameters are consistent with Grade III diastolic dysfunction (restrictive). Elevated left atrial pressure. Right Ventricle: The right ventricular size is mildly enlarged. No increase in right ventricular wall thickness. Right ventricular systolic function is mildly reduced. There is moderately elevated pulmonary artery systolic pressure. The tricuspid regurgitant velocity is 3.16 m/s, and with an assumed right atrial pressure of 15 mmHg, the estimated right ventricular systolic pressure is 54.9 mmHg. Left Atrium: Left atrial size was mildly dilated. Right Atrium: Right atrial size was normal in size. Pericardium: There is no evidence of pericardial effusion. Mitral Valve: The mitral valve is degenerative in  appearance. There is mild thickening of the mitral valve leaflet(s). Moderate mitral annular calcification. Moderate to severe mitral valve regurgitation. No evidence of mitral valve stenosis. Tricuspid Valve: The tricuspid valve is abnormal. Tricuspid valve regurgitation is moderate to severe. The aortic valve is tricuspid. There is moderate thickening of the aortic valve. Aortic valve regurgitation is not visualized. Moderate aortic stenosis is present. Pulmonic Valve: The pulmonic valve was thickened with mildly decreased excursion. Pulmonic valve regurgitation is trivial. No evidence of pulmonic stenosis. Aorta: The aortic  root and ascending aorta are structurally normal, with no evidence of dilitation. Venous: The inferior vena cava is dilated in size with less than 50% respiratory variability, suggesting right atrial pressure of 15 mmHg. IAS/Shunts: No atrial level shunt detected by color flow Doppler.  LEFT VENTRICLE PLAX 2D LVIDd:         5.40 cm   Diastology LVIDs:         4.50 cm   LV e' medial:    4.14 cm/s LV PW:         0.90 cm   LV E/e' medial:  35.9 LV IVS:        0.90 cm   LV e' lateral:   9.50 cm/s LVOT diam:     1.90 cm   LV E/e' lateral: 15.6 LV SV:         31 LV SV Index:   16 LVOT Area:     2.84 cm  RIGHT VENTRICLE            IVC RV Basal diam:  5.20 cm    IVC diam: 2.30 cm RV S prime:     8.27 cm/s TAPSE (M-mode): 1.4 cm LEFT ATRIUM             Index        RIGHT ATRIUM           Index LA diam:        5.10 cm 2.62 cm/m   RA Area:     15.30 cm LA Vol (A2C):   81.9 ml 42.04 ml/m  RA Volume:   38.50 ml  19.76 ml/m LA Vol (A4C):   72.0 ml 36.96 ml/m LA Biplane Vol: 77.3 ml 39.68 ml/m  AORTIC VALVE AV Area (Vmax):    0.84 cm AV Area (Vmean):   0.76 cm AV Area (VTI):     0.81 cm AV Vmax:           214.80 cm/s AV Vmean:          159.200 cm/s AV VTI:            0.380 m AV Peak Grad:      18.5 mmHg AV Mean Grad:      13.0 mmHg LVOT Vmax:         64.00 cm/s LVOT Vmean:        42.900 cm/s LVOT  VTI:          0.109 m LVOT/AV VTI ratio: 0.29  AORTA Ao Root diam: 2.90 cm Ao Asc diam:  3.00 cm MITRAL VALVE                TRICUSPID VALVE MV Area (PHT): 4.06 cm     TR Peak grad:   39.9 mmHg MV Area VTI:   0.54 cm     TR Vmax:        316.00 cm/s MV VTI:        0.57 m MV Decel Time: 187 msec     SHUNTS MV E velocity: 148.33 cm/s  Systemic VTI:  0.11 m MV A velocity: 57.50 cm/s   Systemic Diam: 1.90 cm MV E/A ratio:  2.58 Lonni End MD Electronically signed by Lonni Hanson MD Signature Date/Time: 05/03/2024/6:56:59 AM    Final    CT Angio Aortobifemoral W and/or Wo Contrast Result Date: 05/01/2024 CLINICAL DATA:  Right leg pain EXAM: CT ANGIOGRAPHY OF ABDOMINAL AORTA WITH ILIOFEMORAL RUNOFF TECHNIQUE: Multidetector CT imaging of the abdomen, pelvis and lower extremities  was performed using the standard protocol during bolus administration of intravenous contrast. Multiplanar CT image reconstructions and MIPs were obtained to evaluate the vascular anatomy. RADIATION DOSE REDUCTION: This exam was performed according to the departmental dose-optimization program which includes automated exposure control, adjustment of the mA and/or kV according to patient size and/or use of iterative reconstruction technique. CONTRAST:  OMNIPAQUE  IOHEXOL  350 MG/ML SOLN COMPARISON:  None Available. FINDINGS: VASCULAR Aorta: Abdominal aorta demonstrates atherosclerotic calcifications. No significant mural thrombus or soft atherosclerotic plaque is seen. Celiac: Patent without evidence of aneurysm, dissection, vasculitis or significant stenosis. SMA: Patent without evidence of aneurysm, dissection, vasculitis or significant stenosis. Renals: Heavy atherosclerotic calcifications of the renal arteries are noted bilaterally. At least moderate to severe stenoses are noted bilaterally with renal atrophy. IMA: Patent without evidence of aneurysm, dissection, vasculitis or significant stenosis. RIGHT Lower Extremity Inflow: The  common and external iliac artery demonstrate mild atherosclerotic calcifications. No focal high-grade stenosis is noted Runoff: Common femoral artery and femoral bifurcation are within normal limits. Scattered atherosclerotic calcifications are seen. Mild stenosis is noted in the proximal popliteal artery just above the knee joint. More marked atherosclerotic calcification is noted in the distal popliteal artery although the popliteal trifurcation is patent. Two vessel runoff via the anterior tibial and peroneal artery is seen. Peroneal artery gives collaterals to the distal posterior tibial artery which demonstrates multiple proximal areas of occlusion. LEFT Lower Extremity Inflow: Common and external iliac artery demonstrate atherosclerotic plaque although this does not appear to be flow limiting and no significant poststenotic dilatation is seen. Runoff: Common femoral artery and femoral bifurcation are widely patent. Scattered atherosclerotic changes are noted within the superficial femoral artery on the left. Similar findings are noted within the popliteal artery with a moderate stenosis just above the level of the knee joint. The more distal popliteal artery is patent in the popliteal trifurcation is patent as well. Three-vessel runoff to the left ankle is noted with the anterior tibial and posterior tibial continuing into the foot. Veins: No specific venous abnormality is noted although the examination was not timed for venous evaluation. Review of the MIP images confirms the above findings. NON-VASCULAR Lower chest: Mild emphysematous changes are noted. No focal confluent infiltrate or effusion is seen. Hepatobiliary: Fatty infiltration of the liver is noted. The gallbladder is within normal limits. Pancreas: Unremarkable. No pancreatic ductal dilatation or surrounding inflammatory changes. Spleen: Normal in size without focal abnormality. Adrenals/Urinary Tract: Adrenal glands are within normal limits.  Kidneys show atrophy bilaterally with significant arterial calcifications and nephrocalcinosis. These changes are consistent with the given clinical history of chronic renal failure. Stomach/Bowel: Scattered mild diverticulosis is noted throughout the colon. No diverticulitis is seen. No obstructive changes are noted. The appendix is within normal limits. Small bowel and stomach are within normal limits. Lymphatic: No lymphadenopathy is identified. Reproductive: Status post hysterectomy. No adnexal masses. Other: No abdominal wall hernia or abnormality. No abdominopelvic ascites. Musculoskeletal: Endplate changes in the lumbar spine are noted consistent with the underlying renal failure. No acute fracture is seen. Bilateral hip replacements are noted. Old inferior pubic rami fractures are seen. No other bony abnormality is noted. Considerable soft tissue edema is noted in the lower extremities below the knees similar to that noted on physical exam. IMPRESSION: VASCULAR Scattered atherosclerotic disease is noted described. Two vessel runoff to the right ankle is noted with three-vessel runoff to the left ankle. No arterial abnormality to correspond with the physical exam is identified. NON-VASCULAR Changes  consistent with end-stage renal failure with renal atrophy and bony changes. Diverticulosis without diverticulitis. Fatty infiltration of the liver. Electronically Signed   By: Oneil Devonshire M.D.   On: 05/01/2024 19:17   US  Venous Img Lower Bilateral Result Date: 05/01/2024 EXAM: ULTRASOUND DUPLEX OF THE BILATERAL LOWER EXTREMITY VEINS TECHNIQUE: Duplex ultrasound using B-mode/gray scaled imaging and Doppler spectral analysis and color flow was obtained of the deep venous structures of the bilateral lower extremities. COMPARISON: Bilateral lower extremity venous doppler ultrasound 04/11/2024. CLINICAL HISTORY: Pain and swelling. FINDINGS: Subcutaneous edema is noted below the knee bilaterally. The visualized veins  of the lower extremities are patent and free of echogenic thrombus. The veins demonstrate good compressibility with normal color flow study and spectral analysis. IMPRESSION: 1. No evidence of DVT. 2. Subcutaneous edema below the knee bilaterally. Electronically signed by: Lonni Necessary MD 05/01/2024 05:10 PM EDT RP Workstation: [id number]   CT Angio Chest Pulmonary Embolism (PE) W or WO Contrast Result Date: 04/11/2024 CLINICAL DATA:  Dyspnea EXAM: CT ANGIOGRAPHY CHEST WITH CONTRAST TECHNIQUE: Multidetector CT imaging of the chest was performed using the standard protocol during bolus administration of intravenous contrast. Multiplanar CT image reconstructions and MIPs were obtained to evaluate the vascular anatomy. RADIATION DOSE REDUCTION: This exam was performed according to the departmental dose-optimization program which includes automated exposure control, adjustment of the mA and/or kV according to patient size and/or use of iterative reconstruction technique. CONTRAST:  75mL OMNIPAQUE  IOHEXOL  350 MG/ML SOLN COMPARISON:  Chest x-ray 03/22/2024 FINDINGS: Cardiovascular: Satisfactory opacification of the pulmonary arteries to the segmental level. No evidence of pulmonary embolism. Right-sided central venous catheter with tip in the right atrium. Lead less pacemaker in the right ventricular apex. Cardiomegaly. No pericardial effusion. Multi-vessel coronary vascular calcifications. Mitral calcifications and aortic valvular calcification. Mediastinum/Nodes: Patent trachea. No thyroid  mass. No suspicious lymph nodes. Esophagus within normal limits. Lungs/Pleura: No acute airspace disease or pneumothorax. Trace pleural effusions. Upper Abdomen: Reflux of contrast into the hepatic veins consistent with elevated right heart pressure. No acute finding Musculoskeletal: No acute osseous abnormality Review of the MIP images confirms the above findings. IMPRESSION: 1. Negative for acute pulmonary embolus. 2.  Cardiomegaly with trace pleural effusions. 3. Aortic atherosclerosis. Aortic Atherosclerosis (ICD10-I70.0). Electronically Signed   By: Luke Bun M.D.   On: 04/11/2024 18:05   US  Venous Img Lower Bilateral (DVT) Result Date: 04/11/2024 CLINICAL DATA:  Lower extremity edema and elevated D-dimer. EXAM: BILATERAL LOWER EXTREMITY VENOUS DOPPLER ULTRASOUND TECHNIQUE: Gray-scale sonography with graded compression, as well as color Doppler and duplex ultrasound were performed to evaluate the lower extremity deep venous systems from the level of the common femoral vein and including the common femoral, femoral, profunda femoral, popliteal and calf veins including the posterior tibial, peroneal and gastrocnemius veins when visible. The superficial great saphenous vein was also interrogated. Spectral Doppler was utilized to evaluate flow at rest and with distal augmentation maneuvers in the common femoral, femoral and popliteal veins. COMPARISON:  None Available. FINDINGS: RIGHT LOWER EXTREMITY Common Femoral Vein: No evidence of thrombus. Normal compressibility, respiratory phasicity and response to augmentation. Saphenofemoral Junction: No evidence of thrombus. Normal compressibility and flow on color Doppler imaging. Profunda Femoral Vein: No evidence of thrombus. Normal compressibility and flow on color Doppler imaging. Femoral Vein: No evidence of thrombus. Normal compressibility, respiratory phasicity and response to augmentation. Popliteal Vein: No evidence of thrombus. Normal compressibility, respiratory phasicity and response to augmentation. Calf Veins: No evidence of thrombus. Normal compressibility and flow on color  Doppler imaging. Superficial Great Saphenous Vein: No evidence of thrombus. Normal compressibility. Venous Reflux:  None. Other Findings: No evidence of superficial thrombophlebitis or abnormal fluid collection. LEFT LOWER EXTREMITY Common Femoral Vein: No evidence of thrombus. Normal  compressibility, respiratory phasicity and response to augmentation. Saphenofemoral Junction: No evidence of thrombus. Normal compressibility and flow on color Doppler imaging. Profunda Femoral Vein: No evidence of thrombus. Normal compressibility and flow on color Doppler imaging. Femoral Vein: No evidence of thrombus. Normal compressibility, respiratory phasicity and response to augmentation. Popliteal Vein: No evidence of thrombus. Normal compressibility, respiratory phasicity and response to augmentation. Calf Veins: No evidence of thrombus. Normal compressibility and flow on color Doppler imaging. Superficial Great Saphenous Vein: No evidence of thrombus. Normal compressibility. Venous Reflux:  None. Other Findings: No evidence of superficial thrombophlebitis or abnormal fluid collection. IMPRESSION: No evidence of deep venous thrombosis in either lower extremity. Electronically Signed   By: Marcey Moan M.D.   On: 04/11/2024 14:21      Subjective: Seen and examined on the day of discharge.  Pain well-controlled.  Stable for discharge home.  Discharge Exam: Vitals:   05/03/24 1130 05/03/24 1212  BP: 111/79 (!) 106/54  Pulse: (!) 51 (!) 51  Resp: 18 17  Temp: (!) 96.5 F (35.8 C) 97.6 F (36.4 C)  SpO2: 100% 100%   Vitals:   05/03/24 1100 05/03/24 1123 05/03/24 1130 05/03/24 1212  BP: 119/69  111/79 (!) 106/54  Pulse: (!) 56  (!) 51 (!) 51  Resp: 16 14 18 17   Temp:   (!) 96.5 F (35.8 C) 97.6 F (36.4 C)  TempSrc:   Axillary   SpO2: 100% 100% 100% 100%  Weight:   84 kg   Height:        General: Pt is alert, awake, not in acute distress Cardiovascular: RRR, S1/S2 +, no rubs, no gallops Respiratory: CTA bilaterally, no wheezing, no rhonchi Abdominal: Soft, NT, ND, bowel sounds + Extremities: no edema, no cyanosis    The results of significant diagnostics from this hospitalization (including imaging, microbiology, ancillary and laboratory) are listed below for reference.      Microbiology: Recent Results (from the past 240 hours)  Blood culture (routine x 2)     Status: Abnormal   Collection Time: 05/01/24  4:39 PM   Specimen: BLOOD RIGHT FOREARM  Result Value Ref Range Status   Specimen Description   Final    BLOOD RIGHT FOREARM Performed at [facility], [address] [place], [place] [address]    Special Requests   Final    BOTTLES DRAWN AEROBIC AND ANAEROBIC Blood Culture results may not be optimal due to an inadequate volume of blood received in culture bottles Performed at [facility], [address] [place], [place] [address]    Culture  Setup Time   Final    GRAM POSITIVE COCCI AEROBIC BOTTLE ONLY CRITICAL RESULT CALLED TO, READ BACK BY AND VERIFIED WITH: KRISTIN MERRILL, PHARMD AT 0740 ON 05/02/24 BY GM GRAM STAIN REVIEWED-AGREE WITH RESULT DRT    Culture (A)  Final    STAPHYLOCOCCUS HAEMOLYTICUS THE SIGNIFICANCE OF ISOLATING THIS ORGANISM FROM A SINGLE SET OF BLOOD CULTURES WHEN MULTIPLE SETS ARE DRAWN IS UNCERTAIN. PLEASE NOTIFY THE MICROBIOLOGY DEPARTMENT WITHIN ONE WEEK IF SPECIATION AND SENSITIVITIES ARE REQUIRED. Performed at [facility], [address] [place], [place] [address]    Report Status 05/03/2024 FINAL  Final  Blood Culture ID Panel (Reflexed)  Status: Abnormal   Collection Time: 05/01/24  4:39 PM  Result Value Ref Range Status   Enterococcus faecalis NOT DETECTED NOT DETECTED Final   Enterococcus Faecium NOT DETECTED NOT DETECTED Final   Listeria monocytogenes NOT DETECTED NOT DETECTED Final   Staphylococcus species DETECTED (A) NOT DETECTED Final    Comment: CRITICAL RESULT CALLED TO, READ BACK BY AND VERIFIED WITH: KRISTIN MERRILL, PHARMD AT 0740 ON 05/02/24 BY GM    Staphylococcus aureus (BCID) NOT DETECTED NOT DETECTED Final   Staphylococcus epidermidis NOT DETECTED NOT DETECTED Final   Staphylococcus lugdunensis NOT DETECTED NOT DETECTED Final   Streptococcus species NOT DETECTED NOT  DETECTED Final   Streptococcus agalactiae NOT DETECTED NOT DETECTED Final   Streptococcus pneumoniae NOT DETECTED NOT DETECTED Final   Streptococcus pyogenes NOT DETECTED NOT DETECTED Final   A.calcoaceticus-baumannii NOT DETECTED NOT DETECTED Final   Bacteroides fragilis NOT DETECTED NOT DETECTED Final   Enterobacterales NOT DETECTED NOT DETECTED Final   Enterobacter cloacae complex NOT DETECTED NOT DETECTED Final   Escherichia coli NOT DETECTED NOT DETECTED Final   Klebsiella aerogenes NOT DETECTED NOT DETECTED Final   Klebsiella oxytoca NOT DETECTED NOT DETECTED Final   Klebsiella pneumoniae NOT DETECTED NOT DETECTED Final   Proteus species NOT DETECTED NOT DETECTED Final   Salmonella species NOT DETECTED NOT DETECTED Final   Serratia marcescens NOT DETECTED NOT DETECTED Final   Haemophilus influenzae NOT DETECTED NOT DETECTED Final   Neisseria meningitidis NOT DETECTED NOT DETECTED Final   Pseudomonas aeruginosa NOT DETECTED NOT DETECTED Final   Stenotrophomonas maltophilia NOT DETECTED NOT DETECTED Final   Candida albicans NOT DETECTED NOT DETECTED Final   Candida auris NOT DETECTED NOT DETECTED Final   Candida glabrata NOT DETECTED NOT DETECTED Final   Candida krusei NOT DETECTED NOT DETECTED Final   Candida parapsilosis NOT DETECTED NOT DETECTED Final   Candida tropicalis NOT DETECTED NOT DETECTED Final   Cryptococcus neoformans/gattii NOT DETECTED NOT DETECTED Final    Comment: Performed at [facility], [address] [place], [place] [address]  Blood culture (routine x 2)     Status: None (Preliminary result)   Collection Time: 05/01/24  5:22 PM   Specimen: BLOOD  Result Value Ref Range Status   Specimen Description   Final    BLOOD BLOOD RIGHT HAND Performed at [facility], [address] [place], [place] [address]    Special Requests   Final    BOTTLES DRAWN AEROBIC ONLY Blood Culture results may not be optimal due to an inadequate volume of  blood received in culture bottles Performed at [facility], [address] [place], [place] [address]    Culture   Final    NO GROWTH 2 DAYS Performed at [facility], [address] [place], [place] [address]    Report Status PENDING  Incomplete  Culture, blood (Routine X 2) w Reflex to ID Panel     Status: None (Preliminary result)   Collection Time: 05/02/24 11:20 AM   Specimen: BLOOD RIGHT HAND  Result Value Ref Range Status   Specimen Description   Final    BLOOD RIGHT HAND Performed at [facility], [address], [name], [place] [address]    Special Requests   Final    BOTTLES DRAWN AEROBIC ONLY Blood Culture results may not be optimal due to an inadequate volume of blood received in culture bottles Performed at [facility], [address] [place], [place]  [address]    Culture   Final    NO GROWTH 1 DAY Performed at [facility], [address], [place], [place] [address]    Report Status PENDING  Incomplete  Culture, blood (Routine X 2) w Reflex to ID Panel     Status: None (Preliminary result)   Collection Time: 05/02/24 11:20 AM   Specimen: BLOOD RIGHT HAND  Result Value Ref Range Status   Specimen Description   Final    BLOOD RIGHT HAND Performed at [facility], [address], [place], [place] [address]    Special Requests   Final    BOTTLES DRAWN AEROBIC ONLY Blood Culture adequate volume Performed at [facility], [address] [place], [place] [address]    Culture   Final    NO GROWTH 1 DAY Performed at [facility], [address] [place], [place] [address]    Report Status PENDING  Incomplete     Labs: BNP (last 3 results) Recent Labs    03/19/24 2233 05/02/24 1120  BNP 2,966.4* 2,615.5*   Basic Metabolic Panel: Recent Labs  Lab 05/01/24 1308 05/02/24 0518 05/03/24 0158  NA 138 136 134*  K 3.4* 4.8 4.8  CL 93* 92* 89*  CO2 29 24 23   GLUCOSE 132* 224* 292*  BUN  39* 58* 83*  CREATININE 4.84* 6.05* 7.38*  CALCIUM  7.7* 7.6* 8.1*  MG 2.0  --   --   PHOS 3.3  --   --    Liver Function Tests: Recent Labs  Lab 05/02/24 0518  AST 11*  ALT 17  ALKPHOS 94  BILITOT 1.5*  PROT 5.8*  ALBUMIN 3.4*   No results for input(s): LIPASE, AMYLASE in the last 168 hours. No results for input(s): AMMONIA in the last 168 hours. CBC: Recent Labs  Lab 05/01/24 1308 05/02/24 0518 05/03/24 0158  WBC 7.5 5.9 13.1*  HGB 13.9 13.7 13.0  HCT 43.1 43.6 41.8  MCV 100.5* 100.0 100.7*  PLT 148* 105* 131*   Cardiac Enzymes: No results for input(s): CKTOTAL, CKMB, CKMBINDEX, TROPONINI in the last 168 hours. BNP: Invalid input(s): POCBNP CBG: Recent Labs  Lab 05/02/24 1203 05/02/24 1635 05/02/24 1853 05/02/24 2101 05/03/24 1208  GLUCAP 297* 442* 430* 301* 206*   D-Dimer No results for input(s): DDIMER in the last 72 hours. Hgb A1c No results for input(s): HGBA1C in the last 72 hours. Lipid Profile No results for input(s): CHOL, HDL, LDLCALC, TRIG, CHOLHDL, LDLDIRECT in the last 72 hours. Thyroid  function studies No results for input(s): TSH, T4TOTAL, T3FREE, THYROIDAB in the last 72 hours.  Invalid input(s): FREET3 Anemia work up No results for input(s): VITAMINB12, FOLATE, FERRITIN, TIBC, IRON , RETICCTPCT in the last 72 hours. Urinalysis No results found for: COLORURINE, APPEARANCEUR, LABSPEC, PHURINE, GLUCOSEU, HGBUR, BILIRUBINUR, KETONESUR, PROTEINUR, UROBILINOGEN, NITRITE, LEUKOCYTESUR Sepsis Labs Recent Labs  Lab 05/01/24 1308 05/02/24 0518 05/03/24 0158  WBC 7.5 5.9 13.1*   Microbiology Recent Results (from the past 240 hours)  Blood culture (routine x 2)     Status: Abnormal   Collection Time: 05/01/24  4:39 PM   Specimen: BLOOD RIGHT FOREARM  Result Value Ref Range Status   Specimen Description   Final    BLOOD RIGHT FOREARM Performed at [facility], [address], [place], [place] [address]    Special Requests   Final    BOTTLES DRAWN AEROBIC AND ANAEROBIC Blood Culture results may not be optimal due to  an inadequate volume of blood received in culture bottles Performed at [facility], [address] [place], [place] [address]    Culture  Setup Time   Final    GRAM POSITIVE COCCI AEROBIC BOTTLE ONLY CRITICAL RESULT CALLED TO, READ BACK BY AND VERIFIED WITH: KRISTIN MERRILL, PHARMD AT 0740 ON 05/02/24 BY GM GRAM STAIN REVIEWED-AGREE WITH RESULT DRT    Culture (A)  Final    STAPHYLOCOCCUS HAEMOLYTICUS THE SIGNIFICANCE OF ISOLATING THIS ORGANISM FROM A SINGLE SET OF BLOOD CULTURES WHEN MULTIPLE SETS ARE DRAWN IS UNCERTAIN. PLEASE NOTIFY THE MICROBIOLOGY DEPARTMENT WITHIN ONE WEEK IF SPECIATION AND SENSITIVITIES ARE REQUIRED. Performed at [facility], [address] [place], [place] [address]    Report Status 05/03/2024 FINAL  Final  Blood Culture ID Panel (Reflexed)     Status: Abnormal   Collection Time: 05/01/24  4:39 PM  Result Value Ref Range Status   Enterococcus faecalis NOT DETECTED NOT DETECTED Final   Enterococcus Faecium NOT DETECTED NOT DETECTED Final   Listeria monocytogenes NOT DETECTED NOT DETECTED Final   Staphylococcus species DETECTED (A) NOT DETECTED Final    Comment: CRITICAL RESULT CALLED TO, READ BACK BY AND VERIFIED WITH: KRISTIN MERRILL, PHARMD AT 0740 ON 05/02/24 BY GM    Staphylococcus aureus (BCID) NOT DETECTED NOT DETECTED Final   Staphylococcus epidermidis NOT DETECTED NOT DETECTED Final   Staphylococcus lugdunensis NOT DETECTED NOT DETECTED Final   Streptococcus species NOT DETECTED NOT DETECTED Final   Streptococcus agalactiae NOT DETECTED NOT DETECTED Final   Streptococcus pneumoniae NOT DETECTED NOT DETECTED Final   Streptococcus pyogenes NOT DETECTED NOT DETECTED Final   A.calcoaceticus-baumannii NOT DETECTED NOT DETECTED Final   Bacteroides fragilis NOT DETECTED NOT  DETECTED Final   Enterobacterales NOT DETECTED NOT DETECTED Final   Enterobacter cloacae complex NOT DETECTED NOT DETECTED Final   Escherichia coli NOT DETECTED NOT DETECTED Final   Klebsiella aerogenes NOT DETECTED NOT DETECTED Final   Klebsiella oxytoca NOT DETECTED NOT DETECTED Final   Klebsiella pneumoniae NOT DETECTED NOT DETECTED Final   Proteus species NOT DETECTED NOT DETECTED Final   Salmonella species NOT DETECTED NOT DETECTED Final   Serratia marcescens NOT DETECTED NOT DETECTED Final   Haemophilus influenzae NOT DETECTED NOT DETECTED Final   Neisseria meningitidis NOT DETECTED NOT DETECTED Final   Pseudomonas aeruginosa NOT DETECTED NOT DETECTED Final   Stenotrophomonas maltophilia NOT DETECTED NOT DETECTED Final   Candida albicans NOT DETECTED NOT DETECTED Final   Candida auris NOT DETECTED NOT DETECTED Final   Candida glabrata NOT DETECTED NOT DETECTED Final   Candida krusei NOT DETECTED NOT DETECTED Final   Candida parapsilosis NOT DETECTED NOT DETECTED Final   Candida tropicalis NOT DETECTED NOT DETECTED Final   Cryptococcus neoformans/gattii NOT DETECTED NOT DETECTED Final    Comment: Performed at [facility], [address] [place], [place] [address]  Blood culture (routine x 2)     Status: None (Preliminary result)   Collection Time: 05/01/24  5:22 PM   Specimen: BLOOD  Result Value Ref Range Status   Specimen Description   Final    BLOOD BLOOD RIGHT HAND Performed at [facility], [address] [place], [place] [address]    Special Requests   Final    BOTTLES DRAWN AEROBIC ONLY Blood Culture results may not be optimal due to an inadequate volume of blood received in culture bottles Performed at [facility], [address], [place], [place] [address]  Culture   Final    NO GROWTH 2 DAYS Performed at [facility], [address] [place], [place] [address]    Report Status PENDING  Incomplete  Culture, blood (Routine  X 2) w Reflex to ID Panel     Status: None (Preliminary result)   Collection Time: 05/02/24 11:20 AM   Specimen: BLOOD RIGHT HAND  Result Value Ref Range Status   Specimen Description   Final    BLOOD RIGHT HAND Performed at [facility], [address], [place], [place] [address]    Special Requests   Final    BOTTLES DRAWN AEROBIC ONLY Blood Culture results may not be optimal due to an inadequate volume of blood received in culture bottles Performed at [facility], [address], [place], [place] [address]    Culture   Final    NO GROWTH 1 DAY Performed at [facility], [address], [place], [place] [address]    Report Status PENDING  Incomplete  Culture, blood (Routine X 2) w Reflex to ID Panel     Status: None (Preliminary result)   Collection Time: 05/02/24 11:20 AM   Specimen: BLOOD RIGHT HAND  Result Value Ref Range Status   Specimen Description   Final    BLOOD RIGHT HAND Performed at [facility], [address] [place], [place] [address]    Special Requests   Final    BOTTLES DRAWN AEROBIC ONLY Blood Culture adequate volume Performed at [facility], [address], [place], [place] [address]    Culture   Final    NO GROWTH 1 DAY Performed at [facility], [address] [place], [place] [address]    Report Status PENDING  Incomplete     Time coordinating discharge: 45 minutes  SIGNED:   Calvin KATHEE Robson, MD  [facility] 05/03/2024, 3:20 PM Pager   If 7PM-7AM, please contact night-coverage

## 2024-05-03 NOTE — Progress Notes (Signed)
   05/03/24 1130  Vitals  Temp (!) 96.5 F (35.8 C)  Temp Source Axillary  BP 111/79  MAP (mmHg) 90  BP Location Right Arm  BP Method Automatic  Patient Position (if appropriate) Lying  Pulse Rate (!) 51  Pulse Rate Source Monitor  ECG Heart Rate (!) 56  Resp 18  Weight 84 kg  Type of Weight Post-Dialysis  Oxygen Therapy  SpO2 100 %  O2 Device Nasal Cannula  O2 Flow Rate (L/min) 2 L/min  During Treatment Monitoring  Blood Flow Rate (mL/min) 0 mL/min  Arterial Pressure (mmHg) 25.65 mmHg  Venous Pressure (mmHg) -29.29 mmHg  TMP (mmHg) 12.93 mmHg  Ultrafiltration Rate (mL/min) 0 mL/min  Dialysate Flow Rate (mL/min) 299 ml/min  Duration of HD Treatment -hour(s) 3 hour(s)  Cumulative Fluid Removed (mL) per Treatment  1683.38  Post Treatment  Dialyzer Clearance Lightly streaked  Hemodialysis Intake (mL) 0 mL  Liters Processed 64.3  Fluid Removed (mL) 1700 mL  Tolerated HD Treatment No (Comment) (c/o cramping unable to reach goal)  Post-Hemodialysis Comments tolerated well     05/03/24 1130  Vitals  Temp (!) 96.5 F (35.8 C)  Temp Source Axillary  BP 111/79  MAP (mmHg) 90  BP Location Right Arm  BP Method Automatic  Patient Position (if appropriate) Lying  Pulse Rate (!) 51  Pulse Rate Source Monitor  ECG Heart Rate (!) 56  Resp 18  Weight 84 kg  Type of Weight Post-Dialysis  Oxygen Therapy  SpO2 100 %  O2 Device Nasal Cannula  O2 Flow Rate (L/min) 2 L/min  During Treatment Monitoring  Blood Flow Rate (mL/min) 0 mL/min  Arterial Pressure (mmHg) 25.65 mmHg  Venous Pressure (mmHg) -29.29 mmHg  TMP (mmHg) 12.93 mmHg  Ultrafiltration Rate (mL/min) 0 mL/min  Dialysate Flow Rate (mL/min) 299 ml/min  Duration of HD Treatment -hour(s) 3 hour(s)  Cumulative Fluid Removed (mL) per Treatment  1683.38  Post Treatment  Dialyzer Clearance Lightly streaked  Hemodialysis Intake (mL) 0 mL  Liters Processed 64.3  Fluid Removed (mL) 1700 mL  Tolerated HD Treatment No  (Comment) (c/o cramping unable to reach goal)  Post-Hemodialysis Comments tolerated well   Received patient in bed to unit.  Alert and oriented.  Informed consent signed and in chart.   TX duration 3 hours  Patient tolerated well.  Transported back to the room  Alert, without acute distress.  Hand-off given to patient's nurse.   Access used: RT HD Catheter Access issues: reversed high arterial pressures  Total UF removed: 1700 ml Medication(s) given: none Post HD VS: see above Post HD weight: 84 kg   Docia CHRISTELLA [facility]

## 2024-05-03 NOTE — Progress Notes (Signed)
 [facility]  ROUNDING NOTE   Subjective:   [name] s a 58 year old female with past medical conditions including anemia, GERD, hypertension, stroke, diabetes, and end-stage renal disease on hemodialysis. Patient presents to the emergency department with lower extremity edema and redness. Has been admitted for Leg swelling [M79.89] Pain in both lower extremities [M79.604, M79.605]  Patient is known to our practice and receives outpatient dialysis treatments at [facility] MWF schedule, supervised by Dr. Marcelino.   Update: Patient seen and evaluated during dialysis   HEMODIALYSIS FLOWSHEET:  Blood Flow Rate (mL/min): 349 mL/min Arterial Pressure (mmHg): -218.78 mmHg Venous Pressure (mmHg): 256.15 mmHg TMP (mmHg): 12.12 mmHg Ultrafiltration Rate (mL/min): 1100 mL/min Dialysate Flow Rate (mL/min): 299 ml/min  Tolerating treatment well   Objective:  Vital signs in last 24 hours:  Temp:  [97.4 F (36.3 C)-98.7 F (37.1 C)] 97.6 F (36.4 C) (09/03 0800) Pulse Rate:  [52-95] 53 (09/03 1000) Resp:  [13-22] 15 (09/03 1000) BP: (95-124)/(57-82) 111/67 (09/03 1000) SpO2:  [93 %-100 %] 100 % (09/03 1000) Weight:  [85.3 kg] 85.3 kg (09/03 0800)  Weight change:  Filed Weights   05/01/24 1324 05/03/24 0800  Weight: 85.3 kg 85.3 kg    Intake/Output: No intake/output data recorded.   Intake/Output this shift:  No intake/output data recorded.  Physical Exam: General: NAD  Head: Normocephalic, atraumatic. Moist oral mucosal membranes  Eyes: Anicteric  Neck: Supple  Lungs:  Clear to auscultation  Heart: Regular rate and rhythm  Abdomen:  Soft, nontender  Extremities:  1+ peripheral edema.  Neurologic: Awake, alert, conversant  Skin: BLE erythema, edema, weeping  Access: Rt Permcath    Basic Metabolic Panel: Recent Labs  Lab 05/01/24 1308 05/02/24 0518 05/03/24 0158  NA 138 136 134*  K 3.4* 4.8 4.8  CL 93* 92* 89*  CO2 29 24 23   GLUCOSE 132*  224* 292*  BUN 39* 58* 83*  CREATININE 4.84* 6.05* 7.38*  CALCIUM  7.7* 7.6* 8.1*  MG 2.0  --   --   PHOS 3.3  --   --     Liver Function Tests: Recent Labs  Lab 05/02/24 0518  AST 11*  ALT 17  ALKPHOS 94  BILITOT 1.5*  PROT 5.8*  ALBUMIN 3.4*   No results for input(s): LIPASE, AMYLASE in the last 168 hours. No results for input(s): AMMONIA in the last 168 hours.  CBC: Recent Labs  Lab 05/01/24 1308 05/02/24 0518 05/03/24 0158  WBC 7.5 5.9 13.1*  HGB 13.9 13.7 13.0  HCT 43.1 43.6 41.8  MCV 100.5* 100.0 100.7*  PLT 148* 105* 131*    Cardiac Enzymes: No results for input(s): CKTOTAL, CKMB, CKMBINDEX, TROPONINI in the last 168 hours.  BNP: Invalid input(s): POCBNP  CBG: Recent Labs  Lab 05/02/24 0802 05/02/24 1203 05/02/24 1635 05/02/24 1853 05/02/24 2101  GLUCAP 324* 297* 442* 430* 301*    Microbiology: Results for orders placed or performed during the hospital encounter of 05/01/24  Blood culture (routine x 2)     Status: Abnormal   Collection Time: 05/01/24  4:39 PM   Specimen: BLOOD RIGHT FOREARM  Result Value Ref Range Status   Specimen Description   Final    BLOOD RIGHT FOREARM Performed at [facility], [address], [place], [place] [address]    Special Requests   Final    BOTTLES DRAWN AEROBIC AND ANAEROBIC Blood Culture results may not be optimal due to an inadequate volume of blood received in  culture bottles Performed at [facility], [address] [place], [place] [address]    Culture  Setup Time   Final    GRAM POSITIVE COCCI AEROBIC BOTTLE ONLY CRITICAL RESULT CALLED TO, READ BACK BY AND VERIFIED WITH: KRISTIN MERRILL, PHARMD AT 0740 ON 05/02/24 BY GM GRAM STAIN REVIEWED-AGREE WITH RESULT DRT    Culture (A)  Final    STAPHYLOCOCCUS HAEMOLYTICUS THE SIGNIFICANCE OF ISOLATING THIS ORGANISM FROM A SINGLE SET OF BLOOD CULTURES WHEN MULTIPLE SETS ARE DRAWN IS UNCERTAIN. PLEASE NOTIFY THE MICROBIOLOGY  DEPARTMENT WITHIN ONE WEEK IF SPECIATION AND SENSITIVITIES ARE REQUIRED. Performed at [facility], [address], [place], [place] [address]    Report Status 05/03/2024 FINAL  Final  Blood Culture ID Panel (Reflexed)     Status: Abnormal   Collection Time: 05/01/24  4:39 PM  Result Value Ref Range Status   Enterococcus faecalis NOT DETECTED NOT DETECTED Final   Enterococcus Faecium NOT DETECTED NOT DETECTED Final   Listeria monocytogenes NOT DETECTED NOT DETECTED Final   Staphylococcus species DETECTED (A) NOT DETECTED Final    Comment: CRITICAL RESULT CALLED TO, READ BACK BY AND VERIFIED WITH: KRISTIN MERRILL, PHARMD AT 0740 ON 05/02/24 BY GM    Staphylococcus aureus (BCID) NOT DETECTED NOT DETECTED Final   Staphylococcus epidermidis NOT DETECTED NOT DETECTED Final   Staphylococcus lugdunensis NOT DETECTED NOT DETECTED Final   Streptococcus species NOT DETECTED NOT DETECTED Final   Streptococcus agalactiae NOT DETECTED NOT DETECTED Final   Streptococcus pneumoniae NOT DETECTED NOT DETECTED Final   Streptococcus pyogenes NOT DETECTED NOT DETECTED Final   A.calcoaceticus-baumannii NOT DETECTED NOT DETECTED Final   Bacteroides fragilis NOT DETECTED NOT DETECTED Final   Enterobacterales NOT DETECTED NOT DETECTED Final   Enterobacter cloacae complex NOT DETECTED NOT DETECTED Final   Escherichia coli NOT DETECTED NOT DETECTED Final   Klebsiella aerogenes NOT DETECTED NOT DETECTED Final   Klebsiella oxytoca NOT DETECTED NOT DETECTED Final   Klebsiella pneumoniae NOT DETECTED NOT DETECTED Final   Proteus species NOT DETECTED NOT DETECTED Final   Salmonella species NOT DETECTED NOT DETECTED Final   Serratia marcescens NOT DETECTED NOT DETECTED Final   Haemophilus influenzae NOT DETECTED NOT DETECTED Final   Neisseria meningitidis NOT DETECTED NOT DETECTED Final   Pseudomonas aeruginosa NOT DETECTED NOT DETECTED Final   Stenotrophomonas maltophilia NOT DETECTED NOT DETECTED Final    Candida albicans NOT DETECTED NOT DETECTED Final   Candida auris NOT DETECTED NOT DETECTED Final   Candida glabrata NOT DETECTED NOT DETECTED Final   Candida krusei NOT DETECTED NOT DETECTED Final   Candida parapsilosis NOT DETECTED NOT DETECTED Final   Candida tropicalis NOT DETECTED NOT DETECTED Final   Cryptococcus neoformans/gattii NOT DETECTED NOT DETECTED Final    Comment: Performed at [facility], [address] [place], [place] [address]  Blood culture (routine x 2)     Status: None (Preliminary result)   Collection Time: 05/01/24  5:22 PM   Specimen: BLOOD  Result Value Ref Range Status   Specimen Description BLOOD BLOOD RIGHT HAND  Final   Special Requests   Final    BOTTLES DRAWN AEROBIC ONLY Blood Culture results may not be optimal due to an inadequate volume of blood received in culture bottles   Culture   Final    NO GROWTH < 24 HOURS Performed at [facility], [address] [place], [place] [address]    Report Status PENDING  Incomplete  Coagulation Studies: No results for input(s): LABPROT, INR in the last 72 hours.  Urinalysis: No results for input(s): COLORURINE, LABSPEC, PHURINE, GLUCOSEU, HGBUR, BILIRUBINUR, KETONESUR, PROTEINUR, UROBILINOGEN, NITRITE, LEUKOCYTESUR in the last 72 hours.  Invalid input(s): APPERANCEUR    Imaging: ECHOCARDIOGRAM COMPLETE Result Date: 05/03/2024    ECHOCARDIOGRAM REPORT   Patient Name:   [name] Date of Exam: 05/02/2024 Medical Rec #:  [id number]    Height:       66.0 in Accession #:    [id number]   Weight:       188.1 lb Date of Birth:  11-14-1965    BSA:          1.948 m Patient Age:    57 years     BP:           112/68 mmHg Patient Gender: F            HR:           65 bpm. Exam Location:  [facility] Procedure: 2D Echo, Cardiac Doppler and Color Doppler (Both Spectral and Color            Flow Doppler were utilized during procedure). Indications:     I38 Endocarditis  History:          Patient has prior history of Echocardiogram examinations.                  Pacemaker, Stroke, Signs/Symptoms:Shortness of Breath; Risk                  Factors:Hypertension and Diabetes. Chronic kidney disease.  Sonographer:     Carl Coma RDCS Referring Phys:  [id number] DEVAUGHN SAYRES TNLX Diagnosing Phys: Lonni Hanson MD IMPRESSIONS  1. Left ventricular ejection fraction, by estimation, is 45 to 50%. The left ventricle has mildly decreased function. The left ventricle demonstrates regional wall motion abnormalities (see scoring diagram/findings for description). The left ventricular  internal cavity size was mildly dilated. Left ventricular diastolic parameters are consistent with Grade III diastolic dysfunction (restrictive). Elevated left atrial pressure. There is mild hypokinesis of the left ventricular, entire anteroseptal wall.  2. Right ventricular systolic function is mildly reduced. The right ventricular size is mildly enlarged. There is moderately elevated pulmonary artery systolic pressure.  3. Left atrial size was mildly dilated.  4. The mitral valve is degenerative. Moderate to severe mitral valve regurgitation. No evidence of mitral stenosis. Moderate mitral annular calcification.  5. The tricuspid valve is abnormal. Tricuspid valve regurgitation is moderate to severe.  6. The aortic valve is tricuspid. There is moderate thickening of the aortic valve. Aortic valve regurgitation is not visualized. Moderate aortic valve stenosis with dimensionless index of 0.29. Aortic valve area, by VTI measures 0.81 cm. Aortic valve mean gradient measures 13.0 mmHg.  7. The inferior vena cava is dilated in size with <50% respiratory variability, suggesting right atrial pressure of 15 mmHg. Conclusion(s)/Recommendation(s): No evidence of valvular vegetations on this transthoracic echocardiogram. Consider a transesophageal echocardiogram to exclude infective endocarditis if clinically indicated. FINDINGS   Left Ventricle: Left ventricular ejection fraction, by estimation, is 45 to 50%. The left ventricle has mildly decreased function. The left ventricle demonstrates regional wall motion abnormalities. Mild hypokinesis of the left ventricular, entire anteroseptal wall. The left ventricular internal cavity size was mildly dilated. There is borderline left ventricular hypertrophy. Left ventricular diastolic parameters are consistent with Grade III diastolic dysfunction (restrictive). Elevated left atrial pressure. Right Ventricle: The right ventricular size is mildly enlarged. No  increase in right ventricular wall thickness. Right ventricular systolic function is mildly reduced. There is moderately elevated pulmonary artery systolic pressure. The tricuspid regurgitant velocity is 3.16 m/s, and with an assumed right atrial pressure of 15 mmHg, the estimated right ventricular systolic pressure is 54.9 mmHg. Left Atrium: Left atrial size was mildly dilated. Right Atrium: Right atrial size was normal in size. Pericardium: There is no evidence of pericardial effusion. Mitral Valve: The mitral valve is degenerative in appearance. There is mild thickening of the mitral valve leaflet(s). Moderate mitral annular calcification. Moderate to severe mitral valve regurgitation. No evidence of mitral valve stenosis. Tricuspid Valve: The tricuspid valve is abnormal. Tricuspid valve regurgitation is moderate to severe. The aortic valve is tricuspid. There is moderate thickening of the aortic valve. Aortic valve regurgitation is not visualized. Moderate aortic stenosis is present. Pulmonic Valve: The pulmonic valve was thickened with mildly decreased excursion. Pulmonic valve regurgitation is trivial. No evidence of pulmonic stenosis. Aorta: The aortic root and ascending aorta are structurally normal, with no evidence of dilitation. Venous: The inferior vena cava is dilated in size with less than 50% respiratory variability, suggesting  right atrial pressure of 15 mmHg. IAS/Shunts: No atrial level shunt detected by color flow Doppler.  LEFT VENTRICLE PLAX 2D LVIDd:         5.40 cm   Diastology LVIDs:         4.50 cm   LV e' medial:    4.14 cm/s LV PW:         0.90 cm   LV E/e' medial:  35.9 LV IVS:        0.90 cm   LV e' lateral:   9.50 cm/s LVOT diam:     1.90 cm   LV E/e' lateral: 15.6 LV SV:         31 LV SV Index:   16 LVOT Area:     2.84 cm  RIGHT VENTRICLE            IVC RV Basal diam:  5.20 cm    IVC diam: 2.30 cm RV S prime:     8.27 cm/s TAPSE (M-mode): 1.4 cm LEFT ATRIUM             Index        RIGHT ATRIUM           Index LA diam:        5.10 cm 2.62 cm/m   RA Area:     15.30 cm LA Vol (A2C):   81.9 ml 42.04 ml/m  RA Volume:   38.50 ml  19.76 ml/m LA Vol (A4C):   72.0 ml 36.96 ml/m LA Biplane Vol: 77.3 ml 39.68 ml/m  AORTIC VALVE AV Area (Vmax):    0.84 cm AV Area (Vmean):   0.76 cm AV Area (VTI):     0.81 cm AV Vmax:           214.80 cm/s AV Vmean:          159.200 cm/s AV VTI:            0.380 m AV Peak Grad:      18.5 mmHg AV Mean Grad:      13.0 mmHg LVOT Vmax:         64.00 cm/s LVOT Vmean:        42.900 cm/s LVOT VTI:          0.109 m LVOT/AV VTI ratio: 0.29  AORTA Ao Root diam: 2.90  cm Ao Asc diam:  3.00 cm MITRAL VALVE                TRICUSPID VALVE MV Area (PHT): 4.06 cm     TR Peak grad:   39.9 mmHg MV Area VTI:   0.54 cm     TR Vmax:        316.00 cm/s MV VTI:        0.57 m MV Decel Time: 187 msec     SHUNTS MV E velocity: 148.33 cm/s  Systemic VTI:  0.11 m MV A velocity: 57.50 cm/s   Systemic Diam: 1.90 cm MV E/A ratio:  2.58 Lonni End MD Electronically signed by Lonni Hanson MD Signature Date/Time: 05/03/2024/6:56:59 AM    Final    CT Angio Aortobifemoral W and/or Wo Contrast Result Date: 05/01/2024 CLINICAL DATA:  Right leg pain EXAM: CT ANGIOGRAPHY OF ABDOMINAL AORTA WITH ILIOFEMORAL RUNOFF TECHNIQUE: Multidetector CT imaging of the abdomen, pelvis and lower extremities was performed using the  standard protocol during bolus administration of intravenous contrast. Multiplanar CT image reconstructions and MIPs were obtained to evaluate the vascular anatomy. RADIATION DOSE REDUCTION: This exam was performed according to the departmental dose-optimization program which includes automated exposure control, adjustment of the mA and/or kV according to patient size and/or use of iterative reconstruction technique. CONTRAST:  OMNIPAQUE  IOHEXOL  350 MG/ML SOLN COMPARISON:  None Available. FINDINGS: VASCULAR Aorta: Abdominal aorta demonstrates atherosclerotic calcifications. No significant mural thrombus or soft atherosclerotic plaque is seen. Celiac: Patent without evidence of aneurysm, dissection, vasculitis or significant stenosis. SMA: Patent without evidence of aneurysm, dissection, vasculitis or significant stenosis. Renals: Heavy atherosclerotic calcifications of the renal arteries are noted bilaterally. At least moderate to severe stenoses are noted bilaterally with renal atrophy. IMA: Patent without evidence of aneurysm, dissection, vasculitis or significant stenosis. RIGHT Lower Extremity Inflow: The common and external iliac artery demonstrate mild atherosclerotic calcifications. No focal high-grade stenosis is noted Runoff: Common femoral artery and femoral bifurcation are within normal limits. Scattered atherosclerotic calcifications are seen. Mild stenosis is noted in the proximal popliteal artery just above the knee joint. More marked atherosclerotic calcification is noted in the distal popliteal artery although the popliteal trifurcation is patent. Two vessel runoff via the anterior tibial and peroneal artery is seen. Peroneal artery gives collaterals to the distal posterior tibial artery which demonstrates multiple proximal areas of occlusion. LEFT Lower Extremity Inflow: Common and external iliac artery demonstrate atherosclerotic plaque although this does not appear to be flow limiting and no  significant poststenotic dilatation is seen. Runoff: Common femoral artery and femoral bifurcation are widely patent. Scattered atherosclerotic changes are noted within the superficial femoral artery on the left. Similar findings are noted within the popliteal artery with a moderate stenosis just above the level of the knee joint. The more distal popliteal artery is patent in the popliteal trifurcation is patent as well. Three-vessel runoff to the left ankle is noted with the anterior tibial and posterior tibial continuing into the foot. Veins: No specific venous abnormality is noted although the examination was not timed for venous evaluation. Review of the MIP images confirms the above findings. NON-VASCULAR Lower chest: Mild emphysematous changes are noted. No focal confluent infiltrate or effusion is seen. Hepatobiliary: Fatty infiltration of the liver is noted. The gallbladder is within normal limits. Pancreas: Unremarkable. No pancreatic ductal dilatation or surrounding inflammatory changes. Spleen: Normal in size without focal abnormality. Adrenals/Urinary Tract: Adrenal glands are within normal limits. Kidneys show atrophy bilaterally  with significant arterial calcifications and nephrocalcinosis. These changes are consistent with the given clinical history of chronic renal failure. Stomach/Bowel: Scattered mild diverticulosis is noted throughout the colon. No diverticulitis is seen. No obstructive changes are noted. The appendix is within normal limits. Small bowel and stomach are within normal limits. Lymphatic: No lymphadenopathy is identified. Reproductive: Status post hysterectomy. No adnexal masses. Other: No abdominal wall hernia or abnormality. No abdominopelvic ascites. Musculoskeletal: Endplate changes in the lumbar spine are noted consistent with the underlying renal failure. No acute fracture is seen. Bilateral hip replacements are noted. Old inferior pubic rami fractures are seen. No other bony  abnormality is noted. Considerable soft tissue edema is noted in the lower extremities below the knees similar to that noted on physical exam. IMPRESSION: VASCULAR Scattered atherosclerotic disease is noted described. Two vessel runoff to the right ankle is noted with three-vessel runoff to the left ankle. No arterial abnormality to correspond with the physical exam is identified. NON-VASCULAR Changes consistent with end-stage renal failure with renal atrophy and bony changes. Diverticulosis without diverticulitis. Fatty infiltration of the liver. Electronically Signed   By: Oneil Devonshire M.D.   On: 05/01/2024 19:17   US  Venous Img Lower Bilateral Result Date: 05/01/2024 EXAM: ULTRASOUND DUPLEX OF THE BILATERAL LOWER EXTREMITY VEINS TECHNIQUE: Duplex ultrasound using B-mode/gray scaled imaging and Doppler spectral analysis and color flow was obtained of the deep venous structures of the bilateral lower extremities. COMPARISON: Bilateral lower extremity venous doppler ultrasound 04/11/2024. CLINICAL HISTORY: Pain and swelling. FINDINGS: Subcutaneous edema is noted below the knee bilaterally. The visualized veins of the lower extremities are patent and free of echogenic thrombus. The veins demonstrate good compressibility with normal color flow study and spectral analysis. IMPRESSION: 1. No evidence of DVT. 2. Subcutaneous edema below the knee bilaterally. Electronically signed by: Lonni Necessary MD 05/01/2024 05:10 PM EDT RP Workstation: [id number]     Medications:    vancomycin  1,000 mg (05/03/24 8986)    Chlorhexidine  Gluconate Cloth  6 each Topical Daily   heparin   5,000 Units Subcutaneous Q8H   insulin  aspart  0-20 Units Subcutaneous TID WC   insulin  aspart  0-5 Units Subcutaneous QHS   insulin  glargine  10 Units Subcutaneous QHS   sodium chloride  flush  10-40 mL Intracatheter Q12H   acetaminophen , albuterol , alteplase , heparin , HYDROmorphone  (DILAUDID ) injection, ondansetron  **OR**  ondansetron  (ZOFRAN ) IV, oxyCODONE , sodium chloride  flush  Assessment/ Plan:  Ms. Dajane Valli is a 58 y.o.  female with past medical conditions including anemia, GERD, hypertension, stroke, diabetes, and end-stage renal disease on hemodialysis. Patient presents to the emergency department with lower extremity edema and redness. Has been admitted for Leg swelling [M79.89] Pain in both lower extremities [M79.604, M79.605]   End stage renal disease on hemodialysis. Receiving dialysis today, UF 2-2.5L as tolerated. Next treatment scheduled for Friday.   2. Anemia of chronic kidney disease Lab Results  Component Value Date   HGB 13.0 05/03/2024   Hgb acceptable.  3. Secondary Hyperparathyroidism: with outpatient labs: PTH 1466, phosphorus 8.8, calcium  9.1 on 04/10/24.    Lab Results  Component Value Date   CALCIUM  8.1 (L) 05/03/2024   CAION 1.00 (L) 03/09/2024   PHOS 3.3 05/01/2024     Will continue to monitor bone minerals during this admission. Prescribed calcitriol and sevelamer  with meals outpatient, currently held  4. Diabetes mellitus type II with chronic kidney disease/renal manifestations: insulin  dependent. Home regimen includes Lantus . Most recent hemoglobin A1c is 9.8 on 03/03/24.   Glucose  elevated, primary team to manage SSI       LOS: 2 Kathe Wirick 9/3/202510:28 AM

## 2024-05-03 NOTE — TOC Transition Note (Signed)
 Transition of Care [facility]) - Discharge Note   Patient Details  Name: [name] MRN: [id number] Date of Birth: Jan 11, 1966  [facility] CM/SW Contact:  Alvaro Louder, LCSW Phone Number: 05/03/2024, 4:37 PM   Clinical Narrative:   LCSWA HH with  Patient and she was agreeable. LCSWA reached out to [facility] admissions coordinator an started PT OT and RN service for patient. The patient reported that she would have her husband come pick her up at discharge.   TOC signing off  Final next level of care: Home w Home Health Services Barriers to Discharge: No Barriers Identified   Patient Goals and CMS Choice            Discharge Placement              Patient chooses bed at:  (Home) Patient to be transferred to facility by: Husband Name of family member notified: Self Patient and family notified of of transfer: 05/03/24  Discharge Plan and Services Additional resources added to the After Visit Summary for                            [facility] Arranged: PT, OT, RN [facility] Agency: [facility] Date [facility] Agency Contacted: 05/03/24   Representative spoke with at [facility] Agency: [place]  Social Drivers of Health (SDOH) Interventions SDOH Screenings   Food Insecurity: No Food Insecurity (05/01/2024)  Housing: Low Risk  (05/02/2024)  Transportation Needs: No Transportation Needs (05/01/2024)  Utilities: Not At Risk (05/01/2024)  Alcohol Screen: Low Risk  (04/04/2019)  Depression (PHQ2-9): Low Risk  (04/04/2019)  Financial Resource Strain: Low Risk  (04/06/2024)   Received from [facility]  Physical Activity: Unknown (01/03/2019)  Social Connections: Moderately Isolated (05/01/2024)  Stress: No Stress Concern Present (03/07/2018)  Tobacco Use: Low Risk  (05/01/2024)     Readmission Risk Interventions    03/03/2024    3:31 PM  Readmission Risk Prevention Plan  Transportation Screening Complete  PCP or Specialist Appt within 5-7 Days --  Home Care  Screening Complete  Medication Review (RN CM) Complete

## 2024-05-04 LAB — ANTINUCLEAR ANTIBODIES, IFA: ANA Ab, IFA: NEGATIVE

## 2024-05-04 LAB — LYME DISEASE SEROLOGY W/REFLEX: Lyme Total Antibody EIA: NEGATIVE

## 2024-05-05 ENCOUNTER — Other Ambulatory Visit (HOSPITAL_COMMUNITY): Payer: Self-pay

## 2024-05-05 ENCOUNTER — Telehealth (HOSPITAL_COMMUNITY): Payer: Self-pay | Admitting: Pharmacy Technician

## 2024-05-05 LAB — IGG, IGA, IGM
IgA: 162 mg/dL (ref 87–352)
IgG (Immunoglobin G), Serum: 407 mg/dL — ABNORMAL LOW (ref 586–1602)
IgM (Immunoglobulin M), Srm: 123 mg/dL (ref 26–217)

## 2024-05-05 LAB — ANTISTREPTOLYSIN O TITER: ASO: 25 [IU]/mL (ref 0.0–200.0)

## 2024-05-05 LAB — RHEUMATOID FACTOR: Rheumatoid fact SerPl-aCnc: 10.8 [IU]/mL (ref <14.0)

## 2024-05-05 NOTE — Telephone Encounter (Signed)
 Pharmacy Patient Advocate Encounter  Received notification from [facility] that Prior Authorization for oxyCODONE  HCl 5MG  tablet  has been APPROVED from 05/05/2024 to 06/04/2024   PA #/Case ID/Reference #: [id number]

## 2024-05-05 NOTE — Telephone Encounter (Signed)
 Pharmacy Patient Advocate Encounter   Received notification from Fax that prior authorization for oxyCODONE  HCl 5MG  tablet  is required/requested.   Insurance verification completed.   The patient is insured through [facility] .   Per test claim: PA required; PA submitted to above mentioned insurance via Latent Key/confirmation #/EOC [id number] Status is pending

## 2024-05-06 LAB — CULTURE, BLOOD (ROUTINE X 2): Culture: NO GROWTH

## 2024-05-07 LAB — CULTURE, BLOOD (ROUTINE X 2)
Culture: NO GROWTH
Culture: NO GROWTH
Special Requests: ADEQUATE

## 2024-05-09 ENCOUNTER — Ambulatory Visit (INDEPENDENT_AMBULATORY_CARE_PROVIDER_SITE_OTHER): Admitting: Dermatology

## 2024-05-09 ENCOUNTER — Encounter: Payer: Self-pay | Admitting: Dermatology

## 2024-05-09 DIAGNOSIS — I872 Venous insufficiency (chronic) (peripheral): Secondary | ICD-10-CM | POA: Diagnosis not present

## 2024-05-09 DIAGNOSIS — I878 Other specified disorders of veins: Secondary | ICD-10-CM

## 2024-05-09 DIAGNOSIS — L98491 Non-pressure chronic ulcer of skin of other sites limited to breakdown of skin: Secondary | ICD-10-CM

## 2024-05-09 DIAGNOSIS — L97529 Non-pressure chronic ulcer of other part of left foot with unspecified severity: Secondary | ICD-10-CM

## 2024-05-09 DIAGNOSIS — L97519 Non-pressure chronic ulcer of other part of right foot with unspecified severity: Secondary | ICD-10-CM

## 2024-05-09 MED ORDER — PENTOXIFYLLINE ER 400 MG PO TBCR: 400.0000 mg | EXTENDED_RELEASE_TABLET | Freq: Every day | ORAL | 2 refills | Status: AC

## 2024-05-09 NOTE — Progress Notes (Unsigned)
   New Patient Visit   Subjective  [name] is a 58 y.o. female who presents for the following: referral from vascular Dr. Jhonny for wounds at lower extremities. Patient was in hospital for severe leg pain on 9/1 and was put on vancomycin  for 3 doses which she has helped. Wound care starts 06/01/24 and right now Home Health is coming in to wrap her legs. Per patient, Dr. Jhonny wanted dermatology to lay eyes on her feet to consider skin biopsy for more definitive diagnosis.   Patient accompanied by husband who contributes to history.  The following portions of the chart were reviewed this encounter and updated as appropriate: medications, allergies, medical history  Review of Systems:  No other skin or systemic complaints except as noted in HPI or Assessment and Plan.  Objective  Well appearing patient in no apparent distress; mood and affect are within normal limits.   A focused examination was performed of the following areas: feet  Relevant exam findings are noted in the Assessment and Plan.    Assessment & Plan   STASIS DERMATITIS Exam: Erythematous, scaly patches involving the ankle and distal lower leg with associated lower leg edema.  Wellcontrolled vs notatgoal vs flared***msg cards augustus mealor ivan   Stasis in the legs causes chronic leg swelling, which may result in itchy or painful rashes, skin discoloration, skin texture changes, and sometimes ulceration.  Recommend daily graduated compression hose/stockings- easiest to put on first thing in morning, remove at bedtime.  Elevate legs as much as possible. Avoid salt/sodium rich foods.  Treatment Plan: Start pentoxifylline  400 mg every day  Continue care with home health Keep appt with wound care as scheduled    Return in about 3 months (around 08/08/2024) for with Dr. Claudene stasis follow up.  LILLETTE Lonell Drones, RMA, am acting as scribe for Boneta Claudene, MD .   Documentation: I have reviewed the  above documentation for accuracy and completeness, and I agree with the above.  Boneta Claudene, MD

## 2024-05-09 NOTE — Patient Instructions (Signed)

## 2024-05-12 ENCOUNTER — Encounter: Payer: Self-pay | Admitting: Dermatology

## 2024-05-12 NOTE — Progress Notes (Signed)
 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  She experiences facial swelling, which she attributes to the use of Trelegy, a medication she is currently using once daily. She has a history of adverse reactions to steroids, including breathing difficulties. Despite these side effects, she completed a course of azithromycin  and steroids as previously prescribed.  She has been using a nebulizer once at night and her breathing has improved since completing the steroid course. However, she experienced an asthma attack this morning. Her breathing improved significantly after finishing the steroid treatment, and she has not had issues with fluid in her lungs recently.  She underwent a chest CT scan and an echocardiogram while in the hospital. The CT scan showed no blood clots, and her pacemaker and catheter are in the correct positions.  She mentions a family history of psoriasis, as her sister suffers from it extensively, but she herself has never had psoriasis. No current joint pain, and any previous issues have been resolved with treatment for her legs.   Past Medical History:   Past Medical History:  Diagnosis Date  . Anemia   . Anxiety   . GERD (gastroesophageal reflux disease)   . High serum parathyroid hormone (PTH)    checked through Dialysis  . History of kidney stones 2000  . Hypertension   . kidney failure   . Neuromuscular disorder (CMS/HHS-HCC)    neuropathy in feet  . PONV (postoperative nausea and vomiting)    severe nausea requiring many doses of post op antiemetics  . Type 2 diabetes mellitus (CMS/HHS-HCC)     Past Surgical History:   Past Surgical History:  Procedure  Laterality Date  . ARTHROSCOPIC ROTATOR CUFF REPAIR Right 2004  . AV FISTULA INSERTION W/ RF MAGNETIC GUIDANCE  12/08/2017  . UPPER EXTREMITY VENOGRAPHY  02/15/2018  . Left Hip Anterior Hip Hemiarthroplasty Left 09/29/2021   [facility] - Dr Leora  . DIALYSIS/PERMA CATHETER INSERTION  10/03/2021  . Right quadriceps repair Right 10/13/2021   Dr. Massie Bertrand  . Left hip wound irrigation, debridement and culture with application of Prevena wound VAC Left 11/15/2021   [facility] - Dr. Marchia  . Right hip hemiarthroplasty Right 12/14/2021   Dr. Tobie  . OTHER SURGERY  03/09/2024   Pacemaker Implant  . ABDOMINAL HYSTERECTOMY      Allergies:   Allergies  Allergen Reactions  . Amoxicillin Diarrhea  . Enalapril Other (See Comments) and Hives    Angioedema face. Angioedema face. Angioedema face. Angioedema face.   . Lisinopril Shortness Of Breath  . Prednisone  Shortness Of Breath  . Shellfish Containing Products Swelling    patient tolerates shellfish by mouth without problem patient tolerates shellfish by mouth without problem   . Iodinated Contrast Media Hives    Current Medications:   Prior to Admission medications  Medication Sig Taking? Last Dose  BD ULTRA-FINE MICRO PEN NEEDLE 32 gauge x 1/4 needle 3 (three) times  daily Yes Taking  blood glucose diagnostic test strip 1 each (1 strip total) 3 (three) times daily Use as instructed. Yes Taking  blood glucose meter kit as directed Yes Taking  blood-glucose meter,continuous (DEXCOM G6 RECEIVER) Misc Use 1 each as directed Yes Taking  blood-glucose sensor (DEXCOM G6 SENSOR) Devi Use 1 Device every 10 (ten) days Yes Taking  blood-glucose transmitter (DEXCOM G6 TRANSMITTER) Devi Use 1 each every 3 (three) months Yes Taking  ergocalciferol , vitamin D2, 1,250 mcg (50,000 unit) capsule Take 50,000 Units by mouth every 7 (seven) days Yes Taking  fluticasone-umeclidinium-vilanterol (TRELEGY ELLIPTA) 200-62.5-25 mcg inhaler  Inhale 1 Puff into the lungs once daily Yes Taking  ipratropium-albuteroL  (DUO-NEB) nebulizer solution Take 3 mLs by nebulization 4 (four) times daily for 360 days Yes Taking  lancing device with lancets kit Use 1 each 3 (three) times daily Use as instructed. Yes Taking  NOVOLOG  FLEXPEN U-100 INSULIN  pen injector (concentration 100 units/mL) Take 10 units subcutaneously three times daily before meals plus sliding scale:Take 2 units of NovoLog  if your sugar is 150-200, with increase of 2 units for every 50 increase blood sugar over 200, max 70 units daily Patient taking differently: Inject 14 Units subcutaneously 3 (three) times daily with meals Take 10 units subcutaneously three times daily before meals plus sliding scale:Take 2 units of NovoLog  if your sugar is 150-200, with increase of 2 units for every 50 increase blood sugar over 200, max 70 units daily Yes Taking  amLODIPine  (NORVASC ) 5 MG tablet Take 5 mg by mouth 2 (two) times daily Patient not taking: Reported on 04/06/2024  Not Taking  AURYXIA  210 mg iron  tablet TAKE 2 TABLETS BY MOUTH WITH MEAL AND 1 TABLET WITH SNACK Patient not taking: Reported on 04/06/2024  Not Taking  insulin  GLARGINE (BASAGLAR  KWIKPEN U-100 INSULIN ) pen injector (concentration 100 units/mL) Inject 15 Units subcutaneously nightly Patient not taking: Reported on 05/09/2024  Not Taking  losartan  (COZAAR ) 50 MG tablet Take 50 mg by mouth once daily Patient not taking: Reported on 04/06/2024  Not Taking  methocarbamoL  (ROBAXIN ) 750 MG tablet Take 1 tablet (750 mg total) by mouth 3 (three) times daily as needed Patient not taking: Reported on 04/06/2024  Not Taking    Family History:   Family History  Problem Relation Name Age of Onset  . COPD Mother    . Emphysema Mother    . Cancer Father    . Eczema Sister    . High blood pressure (Hypertension) Brother    . Diabetes Brother    . Alcohol abuse Brother    . Cardiomyopathy (Abnormal function of the heart muscle) Brother       Social History:   Social History   Socioeconomic History  . Marital status: Married  Tobacco Use  . Smoking status: Never  . Smokeless tobacco: Never   Social Drivers of Corporate investment banker Strain: Low Risk  (04/06/2024)   Overall Financial Resource Strain (CARDIA)   . Difficulty of Paying Living Expenses: Not hard at all  Food Insecurity: No Food Insecurity (05/01/2024)   Received from [facility]   Hunger Vital Sign   . Within the past 12 months, you worried that your food would run out before you got the money to buy more.: Never true   . Within the past 12 months, the food you bought just didn't last and you didn't have money to get more.: Never true  Transportation Needs: No Transportation Needs (05/01/2024)  Received from [facility] - Transportation   . In the past 12 months, has lack of transportation kept you from medical appointments or from getting medications?: No   . In the past 12 months, has lack of transportation kept you from meetings, work, or from getting things needed for daily living?: No  Physical Activity: Unknown (01/03/2019)   Received from [facility]   Exercise Vital Sign   . On average, how many days per week do you engage in moderate to strenuous exercise (like a brisk walk)?: 3 days  Stress: No Stress Concern Present (03/07/2018)   Received from [facility] - Occupational Stress Questionnaire   . Feeling of Stress : Not at all  Social Connections: Moderately Isolated (05/01/2024)   Received from [facility]   Social Connection and Isolation Panel   . In a typical week, how many times do you talk on the phone with family, friends, or neighbors?: More than three times a week   . How often do you get together with friends or relatives?: Twice a week   . How often do you attend church or religious services?: Never   . Do you belong to any clubs or organizations such as church groups, unions,  fraternal or athletic groups, or school groups?: No   . How often do you attend meetings of the clubs or organizations you belong to?: Never   . Are you married, widowed, divorced, separated, never married, or living with a partner?: Married  Housing Stability: Low Risk  (04/06/2024)   Housing Stability Vital Sign   . Unable to Pay for Housing in the Last Year: No   . Number of Times Moved in the Last Year: 0   . Homeless in the Last Year: No    Review of Systems:   A 10 point review of systems is negative, except for the pertinent positives and negatives detailed in the HPI.  Vitals:   Vitals:   05/09/24 1159  BP: 117/76  Pulse: 73  SpO2: 99%  Weight: 83.5 kg (184 lb)  Height: 170.2 cm (5' 7)     Body mass index is 28.82 kg/m.  Physical Exam:   Physical Exam Vitals and nursing note reviewed.  Constitutional:      General: in no acute distress.    Appearance: Normal appearance. Is not ill-appearing, toxic-appearing or diaphoretic.  HENT:     Head: Normocephalic and atraumatic.     Right Ear: External ear normal.     Left Ear: External ear normal.  Eyes:     General:        Right eye: No discharge.        Left eye: No discharge.     Extraocular Movements: Extraocular movements intact.     Pupils: Pupils are equal, round, and reactive to light.  Cardiovascular:     Rate and Rhythm: Normal rate and regular rhythm.     Pulses: Normal pulses.     Heart sounds: Normal heart sounds. No murmur heard.    No friction rub. No gallop.  Abdominal:     General: Bowel sounds are normal.  Skin:    General: Skin is warm and dry.     Capillary Refill: Capillary refill takes less than 2 seconds.  Neurological:     Mental Status: Patient is alert.     Lab and Imaging Results:   Results RADIOLOGY Chest CT: Catheter in correct position,  pacemaker in correct position, no abnormal lymph nodes, no pleural effusion, no rib fractures, no pulmonary scarring, no pneumonia, no  malignancy, no emphysema, no COPD  DIAGNOSTIC DVT ultrasound: No deep vein thrombosis Echocardiogram: Mildly reduced ejection fraction, stage 3 diastolic dysfunction, severe mitral valve regurgitation    Assessment and Plan:   Diagnoses and all orders for this visit:  Nonrheumatic mitral valve regurgitation  Pulmonary hypertension (CMS/HHS-HCC) -     Ambulatory Referral to Cardiology  On prednisone  therapy  Severe persistent asthma without complication (CMS/HHS-HCC)    Assessment & Plan Severe persistent asthma with adverse effects of systemic steroids Severe persistent asthma with adverse effects from systemic steroids, including facial swelling. Current treatment with Trelegy inhaler and nebulizer provides symptomatic relief but causes adverse effects. Dupixent, a nonsteroidal injectable medication, is considered due to intolerance to steroids. - Initiate Dupixent once every two weeks, first dose to be administered in the clinic. - Continue Trelegy inhaler once daily. - Continue nebulizer treatment once at night.  Suspected pulmonary hypertension, pending cardiac catheterization Suspected pulmonary hypertension, potentially related to kidney disease and heart issues. No pulmonary edema currently, and breathing improved after discontinuation of steroids. Cardiac catheterization is needed for definitive diagnosis. - Refer to a cardiologist for cardiac catheterization to measure pulmonary pressures.  Severe mitral valve regurgitation with mildly reduced left ventricular ejection fraction and stage 3 diastolic dysfunction Severe mitral valve regurgitation with mildly reduced left ventricular ejection fraction and stage 3 diastolic dysfunction. Pacemaker in place for management. Mitral valve regurgitation may contribute to pulmonary congestion and dyspnea. - Continue monitoring by cardiology team, including pacemaker management.     I spent a total of 41 minutes in both  face-to-face and non-face-to-face activities, excluding procedures performed, for this visit on the date of this encounter.   This note has been created using dictation software tool and any typographical errors are purely unintentional.  Patient received an After Visit Summary

## 2024-05-15 ENCOUNTER — Telehealth: Payer: Self-pay | Admitting: Dermatology

## 2024-05-15 ENCOUNTER — Encounter: Payer: Self-pay | Admitting: Dermatology

## 2024-05-15 NOTE — Telephone Encounter (Signed)
-----   Message ----- From: Nancey Eulas BRAVO, MD ([place]) Sent: 05/15/2024   2:51 PM EDT To: Boneta Sharps, MD [facility]) Subject: RE: Pentoxifylline  for severe venous stasis  I don't see any contraindication. Thanks   ----- Message ----- From: [name] Boneta, MD [facility]) Sent: 05/12/2024  12:18 AM EDT To: Delon DELENA Sharps, RN [facility]); Eulas BRAVO Nancey, MD [facility]) Subject: Pentoxifylline  for severe venous stasis  Dear Dr Nancey and Delon Sharps, RN,  I hope you're both doing well. I saw your patient in dermatology clinic for severe venous stasis of the lower extremities. It has led to non-healing ulceration of the skin. In addition to compression and elevation, I proposed adding pentoxifylline  to improve blood flow (adjusted for being on dialysis). Do you have any concerns about [name] being on this medication given her cardiac comorbidities?  Thank you, [name],  skin center

## 2024-05-16 ENCOUNTER — Inpatient Hospital Stay: Admitting: Infectious Diseases

## 2024-06-01 ENCOUNTER — Encounter: Attending: Physician Assistant | Admitting: Physician Assistant

## 2024-06-01 DIAGNOSIS — I87333 Chronic venous hypertension (idiopathic) with ulcer and inflammation of bilateral lower extremity: Secondary | ICD-10-CM | POA: Insufficient documentation

## 2024-06-01 DIAGNOSIS — E11622 Type 2 diabetes mellitus with other skin ulcer: Secondary | ICD-10-CM | POA: Insufficient documentation

## 2024-06-01 DIAGNOSIS — L97812 Non-pressure chronic ulcer of other part of right lower leg with fat layer exposed: Secondary | ICD-10-CM | POA: Diagnosis not present

## 2024-06-01 DIAGNOSIS — L97522 Non-pressure chronic ulcer of other part of left foot with fat layer exposed: Secondary | ICD-10-CM | POA: Diagnosis not present

## 2024-06-01 DIAGNOSIS — L97412 Non-pressure chronic ulcer of right heel and midfoot with fat layer exposed: Secondary | ICD-10-CM | POA: Insufficient documentation

## 2024-06-01 DIAGNOSIS — I129 Hypertensive chronic kidney disease with stage 1 through stage 4 chronic kidney disease, or unspecified chronic kidney disease: Secondary | ICD-10-CM | POA: Diagnosis not present

## 2024-06-01 DIAGNOSIS — Z992 Dependence on renal dialysis: Secondary | ICD-10-CM | POA: Insufficient documentation

## 2024-06-01 DIAGNOSIS — E1122 Type 2 diabetes mellitus with diabetic chronic kidney disease: Secondary | ICD-10-CM | POA: Diagnosis not present

## 2024-06-01 DIAGNOSIS — L97822 Non-pressure chronic ulcer of other part of left lower leg with fat layer exposed: Secondary | ICD-10-CM | POA: Insufficient documentation

## 2024-06-01 DIAGNOSIS — N186 End stage renal disease: Secondary | ICD-10-CM | POA: Insufficient documentation

## 2024-06-08 ENCOUNTER — Encounter: Admitting: Physician Assistant

## 2024-06-08 DIAGNOSIS — I87333 Chronic venous hypertension (idiopathic) with ulcer and inflammation of bilateral lower extremity: Secondary | ICD-10-CM | POA: Diagnosis not present

## 2024-06-12 ENCOUNTER — Ambulatory Visit: Attending: Cardiology | Admitting: Cardiology

## 2024-06-12 ENCOUNTER — Encounter: Payer: Self-pay | Admitting: Cardiology

## 2024-06-12 VITALS — BP 88/42 | HR 63 | Ht 67.0 in | Wt 180.8 lb

## 2024-06-12 DIAGNOSIS — Z950 Presence of cardiac pacemaker: Secondary | ICD-10-CM

## 2024-06-12 DIAGNOSIS — I442 Atrioventricular block, complete: Secondary | ICD-10-CM | POA: Diagnosis not present

## 2024-06-12 DIAGNOSIS — I5022 Chronic systolic (congestive) heart failure: Secondary | ICD-10-CM

## 2024-06-12 NOTE — Progress Notes (Signed)
 Electrophysiology Clinic Note    Date:  06/12/2024  Patient ID:  [name], [name] [birth year], MRN [id number] PCP:  Patient, No Pcp Per  Cardiologist:  Keller JAYSON Paterson, MD  Electrophysiologist:  Eulas FORBES Furbish, MD  Electrophysiology APP:  Cinque Begley, NP     Discussed the use of AI scribe software for clinical note transcription with the patient, who gave verbal consent to proceed.   Patient Profile    Chief Complaint: PPM follow-up  History of Present Illness: [name] is a 58 y.o. female with PMH notable for CHB s/p leadless PPM, HFmrEF, non-obs CAD, severe MR, mod TR, pulmHTN, ESRD on HD, HTN, T2DM, CVA; seen today for Eulas FORBES Furbish, MD for routine electrophysiology follow-up s/p Pacemaker implant.  She presented to [facility] ER 03/03/2024 with 3 weeks of worsening SOB and dizziness. She was in CHB in ER, transferred to [facility] and is s/p Micra implantation.   She saw Dr. Paterson at [name] for initial consult to esablish care 05/2024, during which time she was c/o increased DOE with transferring from bed > chair. Did not recommend RHC.    On follow-up today, she continues to feel unwell, main complaint is bilateral leg pain. She continues to have low blood pressures after dialysis, and overall feels unwell.   She denies chest pain, chest pressure, palpitations. She continues to see wound care regarding her lower extremities.     Arrhythmia/Device History MICRA MDT, imp 02/2024; dx CHB    ROS:  Please see the history of present illness. All other systems are reviewed and otherwise negative.    Physical Exam    VS:  BP (!) 88/42 (BP Location: Right Arm, Patient Position: Sitting, Cuff Size: Normal)   Pulse 63   Ht 5' 7 (1.702 m)   Wt 180 lb 12.4 oz (82 kg)   SpO2 98%   BMI 28.31 kg/m  BMI: Body mass index is 28.31 kg/m.           Wt Readings from Last 3 Encounters:  06/12/24 180 lb 12.4 oz (82 kg)  05/03/24 185 lb 3 oz (84 kg)  03/24/24 188 lb  0.8 oz (85.3 kg)     GEN- The patient is well appearing, alert and oriented x 3 today.   Lungs- Clear to ausculation bilaterally, normal work of breathing.  Heart- Regular rate and rhythm, no murmurs, rubs or gallops Extremities- bandages in place   Device interrogation done today and reviewed by myself:  Battery good Am-VP reduced to 57% Attempted to optimize PPM, was unsuccessful   Studies Reviewed   Previous EP, cardiology notes.    EKG is ordered. Personal review of EKG from today shows:   EKG Interpretation Date/Time:  Monday June 12 2024 14:37:32 EDT Ventricular Rate:  63 PR Interval:    QRS Duration:  164 QT Interval:  596 QTC Calculation: 609 R Axis:   -55  Text Interpretation: sinus rhythm with lack of ventricular sensing Confirmed by Navya Timmons [phone]) on 06/12/2024 3:54:23 PM    TTE, 05/02/2024  1. Left ventricular ejection fraction, by estimation, is 45 to 50%. The left ventricle has mildly decreased function. The left ventricle demonstrates regional wall motion abnormalities (see scoring diagram/findings for description). The left ventricular internal cavity size was mildly dilated. Left ventricular diastolic parameters are consistent with Grade III diastolic dysfunction (restrictive). Elevated left atrial pressure. There is mild hypokinesis of the left ventricular, entire anteroseptal wall.   2. Right ventricular systolic  function is mildly reduced. The right ventricular size is mildly enlarged. There is moderately elevated pulmonary artery systolic pressure.   3. Left atrial size was mildly dilated.   4. The mitral valve is degenerative. Moderate to severe mitral valve regurgitation. No evidence of mitral stenosis. Moderate mitral annular calcification.   5. The tricuspid valve is abnormal. Tricuspid valve regurgitation is moderate to severe.   6. The aortic valve is tricuspid. There is moderate thickening of the aortic valve. Aortic valve regurgitation is  not visualized. Moderate aortic valve stenosis with dimensionless index of 0.29. Aortic valve area, by VTI measures 0.81 cm. Aortic valve mean gradient measures 13.0 mmHg.   7. The inferior vena cava is dilated in size with <50% respiratory variability, suggesting right atrial pressure of 15 mmHg.   Conclusion(s)/Recommendation(s): No evidence of valvular vegetations on this transthoracic echocardiogram. Consider a transesophageal  echocardiogram to exclude infective endocarditis if clinically indicated.   TTE, 03/03/2024  1. Left ventricular ejection fraction, by estimation, is 40 to 45%. The left ventricle has mildly decreased function. The left ventricle demonstrates regional wall motion abnormalities (see scoring diagram/findings for description). The left ventricular internal cavity size was moderately dilated. Left ventricular diastolic function could not be evaluated. Elevated left atrial pressure. The E/e' is 17.   2. Right ventricular systolic function is low normal. The right ventricular size is normal. increased. A catheter within the right atrium is visualized. There is severely elevated pulmonary artery systolic pressure.   3. The mitral valve is degenerative. Severe mitral valve regurgitation. Mild mitral valve stenosis (MG , MVA (VTI) 1.07cm2, HR 46bpm, RVSP ). Accuracy limited due to complete heart block. mitral stenosis. Severe mitral annular calcification.   4. Tricuspid valve regurgitation is severe.   5. The aortic valve is normal in structure. Aortic valve regurgitation is not visualized. Aortic valve sclerosis with mild stenosis (peak velocity 2.67m/s, MG , AVA VTI 1.33cm2, DI 0.47). LCC is not very mobile.   6. Pulmonic valve regurgitation is moderate.   7. The inferior vena cava is dilated in size with <50% respiratory variability, suggesting right atrial pressure of 15 mmHg.   Comparison(s): No prior Echocardiogram.    Assessment and Plan     #) CHB s/p  leadless PPM Intermittent atrial tracking, attempted to optimize device with rep on phone which was unsuccessful Will bring patient back to clinic soon with device rep in person to optimize  #) HFmrEF #) MR  TR #) pulm HTN Fluid status managed with HD She has established care with [facility] cardiology, recommend ongoing follow-up         Current medicines are reviewed at length with the patient today.   The patient does not have concerns regarding her medicines.  The following changes were made today:  none  Labs/ tests ordered today include:  Orders Placed This Encounter  Procedures   EKG 12-Lead     Disposition: Follow up with EP APP in 1-2 weeks  with MDT rep present   Signed, Amoura Ransier, NP  06/12/24  3:54 PM  Electrophysiology [facility]

## 2024-06-12 NOTE — Patient Instructions (Signed)
 Medication Instructions:  Your physician recommends that you continue on your current medications as directed. Please refer to the Current Medication list given to you today.  *If you need a refill on your cardiac medications before your next appointment, please call your pharmacy*  Lab Work: No labs ordered today  If you have labs (blood work) drawn today and your tests are completely normal, you will receive your results only by: MyChart Message (if you have MyChart) OR A paper copy in the mail If you have any lab test that is abnormal or we need to change your treatment, we will call you to review the results.  Testing/Procedures: No test ordered today   Follow-Up: At [facility], you and your health needs are our priority.  As part of our continuing mission to provide you with exceptional heart care, our providers are all part of one team.  This team includes your primary Cardiologist (physician) and Advanced Practice Providers or APPs (Physician Assistants and Nurse Practitioners) who all work together to provide you with the care you need, when you need it.  Your next appointment:   June 22, 2024 at 1:30 pm    Provider:  Suzann Riddle, NP    We recommend signing up for the patient portal called MyChart.  Sign up information is provided on this After Visit Summary.  MyChart is used to connect with patients for Virtual Visits (Telemedicine).  Patients are able to view lab/test results, encounter notes, upcoming appointments, etc.  Non-urgent messages can be sent to your provider as well.   To learn more about what you can do with MyChart, go to [email or web address].

## 2024-06-15 ENCOUNTER — Encounter

## 2024-06-15 DIAGNOSIS — I87333 Chronic venous hypertension (idiopathic) with ulcer and inflammation of bilateral lower extremity: Secondary | ICD-10-CM | POA: Diagnosis not present

## 2024-06-16 ENCOUNTER — Ambulatory Visit: Payer: Self-pay | Admitting: *Deleted

## 2024-06-16 ENCOUNTER — Telehealth: Payer: Self-pay | Admitting: Cardiovascular Disease

## 2024-06-16 DIAGNOSIS — Z950 Presence of cardiac pacemaker: Secondary | ICD-10-CM

## 2024-06-16 NOTE — Telephone Encounter (Signed)
 Advised UC/ED for sx now. Scheduled new patient appt 07/25/24 at [facility] but patient requesting Dr. Myrla as PCP but unable to schedule with her so scheduled with Dr. Franchot.     FYI Only or Action Required?: Action required by provider: request for appointment and requesting new patient appt with Dr. Myrla at [facility].  Patient was last seen in primary care on no PCP.  Called Nurse Triage reporting Leg Pain and Abdominal Pain.  Symptoms began several weeks ago.  Interventions attempted: OTC medications: tylenol  500 mg every 6 hours and oxycodone  1/2 tablet prn.  Symptoms are: gradually worsening.  Triage Disposition: See Physician Within 24 Hours  Patient/caregiver understands and will follow disposition?: Unsure               Copied from CRM #[id number]. Topic: Clinical - Red Word Triage >> Jun 16, 2024 11:56 AM Treva T wrote: [facility] that prompted transfer to Nurse Triage: Patient states she has worsening pain, in legs, feels like stabbing pain.  Has been taking pan medications prescribed while in hospital, but does not help.   Also, patient reports is having lower abdominal pain, and has noticed painful area that is oval shaped. Reason for Disposition  [1] MODERATE pain (e.g., interferes with normal activities) AND [2] pain comes and goes (cramps) AND [3] present > 24 hours  (Exception: Pain with Vomiting or Diarrhea - see that Guideline.)  Answer Assessment - Initial Assessment Questions Recommended UC/ED due to no PCP listed. Recommended patient could call cardiologist or surgeon back for recommendations as well. Reported to patient more than likely a recommendation would be to be evaluated at [facility] / ED. If worsening sx call 911.  Patient also requesting new patient appt at [facility] with Dr. Bacigalupo and unable to schedule with provider. New patient appt was scheduled at [facility] with other provider for 07/25/24. Patient on waitlist for new patient appt with Dr. KATHEE.  Unable  to notify CAL at [facility] due to closed for lunch at this time.   1. LOCATION: Where does it hurt?      Hard area egg shaped right lower abdomen  2. RADIATION: Does the pain shoot anywhere else? (e.g., chest, back)     No  3. ONSET: When did the pain begin? (e.g., minutes, hours or days ago)      2 weeks ago  4. SUDDEN: Gradual or sudden onset?     Gradual  5. PATTERN Does the pain come and go, or is it constant?     Comes and goes when touched or leans against something 6. SEVERITY: How bad is the pain?  (e.g., Scale 1-10; mild, moderate, or severe)     At rest no pain , when touched or against something, moderate pain  7. RECURRENT SYMPTOM: Have you ever had this type of stomach pain before? If Yes, ask: When was the last time? and What happened that time?      Na . S/p pacemaker inserted  8. CAUSE: What do you think is causing the stomach pain? (e.g., gallstones, recent abdominal surgery)     S/p new procedure for pacemaker insertion 9. RELIEVING/AGGRAVATING FACTORS: What makes it better or worse? (e.g., antacids, bending or twisting motion, bowel movement)     Decreases pain for short time. 1/2 tablet oxycodone . Tylenol  500 mg every 6 hours.   10. OTHER SYMPTOMS: Do you have any other symptoms? (e.g., back pain, diarrhea, fever, urination pain, vomiting)       No fever ,  no pain until right low abdominal area touched. Egg shaped area reported.  11. PREGNANCY: Is there any chance you are pregnant? When was your last menstrual period?       na  Protocols used: Abdominal Pain - Female-A-AH

## 2024-06-16 NOTE — Telephone Encounter (Signed)
 Spoke with patient via telephone. Patient states there is an egg shaped knot under the skin where the pacemaker was inserted that has now been started to get tender and make the area sore. Upon exam, she states the area does have some redness but is not warm to the touch or with drainage. She did speak with her primary care provider who stated it could be a hematoma. Patient is due to come in next week to see Suzann Riddle, NP.SABRA told patient if able, she can attach a picture of the knot onto my chart for easier and more accurate exam to give recommendation if she needs to be seen sooner or can wait until next week. Patient will send picture via my chart, no further questions at this time.

## 2024-06-16 NOTE — Telephone Encounter (Signed)
 Pt stated she has an egg shaped lump at the incision made to place her pace maker done by MD. She stated its been there since the procedure but now its hurting her and causing her lower stomach to be tender. She thought it would go away but not it's giving her problems and she's concerned. Please advise.

## 2024-06-19 NOTE — Telephone Encounter (Signed)
 LVM to schedule next available bilateral groin ultrasound appt at [facility] location

## 2024-06-19 NOTE — Telephone Encounter (Signed)
 Pt returning call to schedule. Please advise.

## 2024-06-21 ENCOUNTER — Ambulatory Visit (HOSPITAL_COMMUNITY)
Admission: RE | Admit: 2024-06-21 | Discharge: 2024-06-21 | Disposition: A | Discharge status: Home / Self Care | Source: Ambulatory Visit | Attending: Cardiology | Admitting: Cardiology

## 2024-06-21 DIAGNOSIS — Z950 Presence of cardiac pacemaker: Secondary | ICD-10-CM

## 2024-06-21 DIAGNOSIS — R1024 Suprapubic pain: Secondary | ICD-10-CM | POA: Diagnosis not present

## 2024-06-22 ENCOUNTER — Encounter: Admitting: Physician Assistant

## 2024-06-22 ENCOUNTER — Encounter: Payer: Self-pay | Admitting: Cardiology

## 2024-06-22 ENCOUNTER — Ambulatory Visit: Attending: Cardiology | Admitting: Cardiology

## 2024-06-22 VITALS — BP 110/68 | HR 71 | Ht 67.0 in | Wt 178.6 lb

## 2024-06-22 DIAGNOSIS — I442 Atrioventricular block, complete: Secondary | ICD-10-CM | POA: Diagnosis not present

## 2024-06-22 DIAGNOSIS — Z950 Presence of cardiac pacemaker: Secondary | ICD-10-CM

## 2024-06-22 DIAGNOSIS — I87333 Chronic venous hypertension (idiopathic) with ulcer and inflammation of bilateral lower extremity: Secondary | ICD-10-CM | POA: Diagnosis not present

## 2024-06-22 LAB — CUP PACEART INCLINIC DEVICE CHECK
Date Time Interrogation Session: 20251023155037
Implantable Pulse Generator Implant Date: 20250710

## 2024-06-22 NOTE — Progress Notes (Signed)
 Electrophysiology Clinic Note    Date:  06/22/2024  Patient ID:  [name], [name] 01-28-1966, MRN [id number] PCP:  Patient, No Pcp Per  Cardiologist:  Keller JAYSON Paterson, MD  Electrophysiologist:  Eulas FORBES Furbish, MD  Electrophysiology APP:  Gurbani Figge, NP     Discussed the use of AI scribe software for clinical note transcription with the patient, who gave verbal consent to proceed.   Patient Profile    Chief Complaint: PPM follow-up  History of Present Illness: [name] is a 58 y.o. female with PMH notable for CHB s/p leadless PPM, HFmrEF, non-obs CAD, severe MR, mod TR, pulmHTN, ESRD on HD, HTN, T2DM, CVA; seen today for Eulas FORBES Furbish, MD for routine electrophysiology follow-up s/p Pacemaker implant.  She presented to [facility] ER 03/03/2024 with 3 weeks of worsening SOB and dizziness. She was in CHB in ER, transferred to [facility] and is s/p Micra implantation.   She saw Dr. Paterson at [facility] for initial consult to esablish care 05/2024, during which time she was c/o increased DOE with transferring from bed > chair. Did not recommend RHC.  I saw her last week for routine 90d post-PPM implant where it appeared that the device was not sensing atrial activity appropriately, and so soon follow-up with rep present was recommended. She had mild groin swelling at that time, recommended to cont to monitor.   She messaged clinic after the visit that her groin was more painful. Ultrasound revealed an enlarged lymph node.   On follow-up today, she overall feels the same as last week. She has appt soon with PCP to establish care to discuss enlarged lymph node.     She denies chest pain, chest pressure, palpitations. She continues to see wound care regarding her lower extremities.     Arrhythmia/Device History MICRA MDT, imp 02/2024; dx CHB    ROS:  Please see the history of present illness. All other systems are reviewed and otherwise negative.    Physical Exam    VS:   BP 110/68   Pulse 71   Ht 5' 7 (1.702 m)   Wt 178 lb 9.2 oz (81 kg)   SpO2 99%   BMI 27.97 kg/m  BMI: Body mass index is 27.97 kg/m.           Wt Readings from Last 3 Encounters:  06/22/24 178 lb 9.2 oz (81 kg)  06/12/24 180 lb 12.4 oz (82 kg)  05/03/24 185 lb 3 oz (84 kg)     GEN- The patient is well appearing, alert and oriented x 3 today.   Lungs- Clear to ausculation bilaterally, normal work of breathing.  Heart- Regular rate and rhythm, no murmurs, rubs or gallops Extremities- bandages in place   Device interrogation done today and reviewed by myself:  Battery good Am-VP at 76% Significant device adjustments/optimization See paceart for final settings   Studies Reviewed   Previous EP, cardiology notes.    EKG is ordered. Personal review of EKG from today shows:   EKG Interpretation Date/Time:  Thursday June 22 2024 14:19:39 EDT Ventricular Rate:  87 PR Interval:    QRS Duration:  198 QT Interval:  454 QTC Calculation: 546 R Axis:   -79  Text Interpretation: Ventricular-paced rhythm Confirmed by Jammal Sarr [id number]) on 06/22/2024 3:40:21 PM    TTE, 05/02/2024  1. Left ventricular ejection fraction, by estimation, is 45 to 50%. The left ventricle has mildly decreased function. The left ventricle demonstrates regional wall  motion abnormalities (see scoring diagram/findings for description). The left ventricular internal cavity size was mildly dilated. Left ventricular diastolic parameters are consistent with Grade III diastolic dysfunction (restrictive). Elevated left atrial pressure. There is mild hypokinesis of the left ventricular, entire anteroseptal wall.   2. Right ventricular systolic function is mildly reduced. The right ventricular size is mildly enlarged. There is moderately elevated pulmonary artery systolic pressure.   3. Left atrial size was mildly dilated.   4. The mitral valve is degenerative. Moderate to severe mitral valve regurgitation. No  evidence of mitral stenosis. Moderate mitral annular calcification.   5. The tricuspid valve is abnormal. Tricuspid valve regurgitation is moderate to severe.   6. The aortic valve is tricuspid. There is moderate thickening of the aortic valve. Aortic valve regurgitation is not visualized. Moderate aortic valve stenosis with dimensionless index of 0.29. Aortic valve area, by VTI measures 0.81 cm. Aortic valve mean gradient measures 13.0 mmHg.   7. The inferior vena cava is dilated in size with <50% respiratory variability, suggesting right atrial pressure of 15 mmHg.   Conclusion(s)/Recommendation(s): No evidence of valvular vegetations on this transthoracic echocardiogram. Consider a transesophageal echocardiogram to exclude infective endocarditis if clinically indicated.   TTE, 03/03/2024  1. Left ventricular ejection fraction, by estimation, is 40 to 45%. The left ventricle has mildly decreased function. The left ventricle demonstrates regional wall motion abnormalities (see scoring diagram/findings for description). The left ventricular internal cavity size was moderately dilated. Left ventricular diastolic function could not be evaluated. Elevated left atrial pressure. The E/e' is 17.   2. Right ventricular systolic function is low normal. The right ventricular size is normal. increased. A catheter within the right atrium is visualized. There is severely elevated pulmonary artery systolic pressure.   3. The mitral valve is degenerative. Severe mitral valve regurgitation. Mild mitral valve stenosis (MG , MVA (VTI) 1.07cm2, HR 46bpm, RVSP ). Accuracy limited due to complete heart block. mitral stenosis. Severe mitral annular calcification.   4. Tricuspid valve regurgitation is severe.   5. The aortic valve is normal in structure. Aortic valve regurgitation is not visualized. Aortic valve sclerosis with mild stenosis (peak velocity 2.99m/s, MG , AVA VTI 1.33cm2, DI 0.47). LCC is not very  mobile.   6. Pulmonic valve regurgitation is moderate.   7. The inferior vena cava is dilated in size with <50% respiratory variability, suggesting right atrial pressure of 15 mmHg.   Comparison(s): No prior Echocardiogram.    Assessment and Plan     #) CHB s/p leadless PPM Extensive device adjustments made with MDT Rep (Del) Atrial tracking significantly improved   #) HFmrEF #) MR  TR #) pulm HTN #) ESRD on HD Fluid status managed with HD Continue follow-up with [facility]         Current medicines are reviewed at length with the patient today.   The patient does not have concerns regarding her medicines.  The following changes were made today:  none  Labs/ tests ordered today include:  Orders Placed This Encounter  Procedures   EKG 12-Lead     Disposition: Follow up with Dr. Nancey or EP APP in 6 months    I spent 45 minutes face-to-face caring for patient performing device adjustments along with MDT Rep   Signed, Chantal Needle, NP  06/22/24  3:41 PM  Electrophysiology [facility]

## 2024-06-22 NOTE — Patient Instructions (Signed)
 Medication Instructions:   Your physician recommends that you continue on your current medications as directed. Please refer to the Current Medication list given to you today.    *If you need a refill on your cardiac medications before your next appointment, please call your pharmacy*  Lab Work: No labs ordered today  If you have labs (blood work) drawn today and your tests are completely normal, you will receive your results only by: MyChart Message (if you have MyChart) OR A paper copy in the mail If you have any lab test that is abnormal or we need to change your treatment, we will call you to review the results.  Testing/Procedures: No test ordered today   Follow-Up: At [facility], you and your health needs are our priority.  As part of our continuing mission to provide you with exceptional heart care, our providers are all part of one team.  This team includes your primary Cardiologist (physician) and Advanced Practice Providers or APPs (Physician Assistants and Nurse Practitioners) who all work together to provide you with the care you need, when you need it.  Your next appointment:   6 month(s)  Provider:   Suzann Riddle, NP    We recommend signing up for the patient portal called MyChart.  Sign up information is provided on this After Visit Summary.  MyChart is used to connect with patients for Virtual Visits (Telemedicine).  Patients are able to view lab/test results, encounter notes, upcoming appointments, etc.  Non-urgent messages can be sent to your provider as well.   To learn more about what you can do with MyChart, go to [email or web address].

## 2024-06-23 ENCOUNTER — Ambulatory Visit: Payer: Self-pay | Admitting: Cardiology

## 2024-06-28 ENCOUNTER — Encounter: Admitting: Physician Assistant

## 2024-06-28 DIAGNOSIS — I87333 Chronic venous hypertension (idiopathic) with ulcer and inflammation of bilateral lower extremity: Secondary | ICD-10-CM | POA: Diagnosis not present

## 2024-06-29 ENCOUNTER — Encounter: Admitting: Physician Assistant

## 2024-07-02 ENCOUNTER — Other Ambulatory Visit: Payer: Self-pay

## 2024-07-02 ENCOUNTER — Emergency Department
Admission: EM | Admit: 2024-07-02 | Discharge: 2024-07-02 | Disposition: A | Discharge status: Home / Self Care | Attending: Emergency Medicine | Admitting: Emergency Medicine

## 2024-07-02 DIAGNOSIS — L97911 Non-pressure chronic ulcer of unspecified part of right lower leg limited to breakdown of skin: Secondary | ICD-10-CM | POA: Insufficient documentation

## 2024-07-02 DIAGNOSIS — I120 Hypertensive chronic kidney disease with stage 5 chronic kidney disease or end stage renal disease: Secondary | ICD-10-CM | POA: Insufficient documentation

## 2024-07-02 DIAGNOSIS — N186 End stage renal disease: Secondary | ICD-10-CM | POA: Insufficient documentation

## 2024-07-02 DIAGNOSIS — Z992 Dependence on renal dialysis: Secondary | ICD-10-CM | POA: Insufficient documentation

## 2024-07-02 DIAGNOSIS — L97921 Non-pressure chronic ulcer of unspecified part of left lower leg limited to breakdown of skin: Secondary | ICD-10-CM

## 2024-07-02 DIAGNOSIS — L97912 Non-pressure chronic ulcer of unspecified part of right lower leg with fat layer exposed: Secondary | ICD-10-CM | POA: Insufficient documentation

## 2024-07-02 LAB — CBC
HCT: 40.5 % (ref 36.0–46.0)
Hemoglobin: 12.6 g/dL (ref 12.0–15.0)
MCH: 31.2 pg (ref 26.0–34.0)
MCHC: 31.1 g/dL (ref 30.0–36.0)
MCV: 100.2 fL — ABNORMAL HIGH (ref 80.0–100.0)
Platelets: 301 K/uL (ref 150–400)
RBC: 4.04 MIL/uL (ref 3.87–5.11)
RDW: 18.7 % — ABNORMAL HIGH (ref 11.5–15.5)
WBC: 9.9 K/uL (ref 4.0–10.5)
nRBC: 0 % (ref 0.0–0.2)

## 2024-07-02 LAB — HEPATIC FUNCTION PANEL
ALT: 11 U/L (ref 0–44)
AST: 14 U/L — ABNORMAL LOW (ref 15–41)
Albumin: 3.7 g/dL (ref 3.5–5.0)
Alkaline Phosphatase: 182 U/L — ABNORMAL HIGH (ref 38–126)
Bilirubin, Direct: 0.1 mg/dL (ref 0.0–0.2)
Indirect Bilirubin: 0.7 mg/dL (ref 0.3–0.9)
Total Bilirubin: 0.8 mg/dL (ref 0.0–1.2)
Total Protein: 7.5 g/dL (ref 6.5–8.1)

## 2024-07-02 LAB — BASIC METABOLIC PANEL WITH GFR
Anion gap: 34 — ABNORMAL HIGH (ref 5–15)
BUN: 49 mg/dL — ABNORMAL HIGH (ref 6–20)
CO2: 17 mmol/L — ABNORMAL LOW (ref 22–32)
Calcium: 11.1 mg/dL — ABNORMAL HIGH (ref 8.9–10.3)
Chloride: 88 mmol/L — ABNORMAL LOW (ref 98–111)
Creatinine, Ser: 7.4 mg/dL — ABNORMAL HIGH (ref 0.44–1.00)
GFR, Estimated: 6 mL/min — ABNORMAL LOW (ref >60)
Glucose, Bld: 87 mg/dL (ref 70–99)
Potassium: 4.6 mmol/L (ref 3.5–5.1)
Sodium: 139 mmol/L (ref 135–145)

## 2024-07-02 LAB — LACTIC ACID, PLASMA: Lactic Acid, Venous: 2.3 mmol/L (ref 0.5–1.9)

## 2024-07-02 LAB — PROTIME-INR
INR: 1.2 (ref 0.8–1.2)
Prothrombin Time: 15.8 s — ABNORMAL HIGH (ref 11.4–15.2)

## 2024-07-02 LAB — ACETAMINOPHEN LEVEL: Acetaminophen (Tylenol), Serum: 31 ug/mL — ABNORMAL HIGH (ref 10–30)

## 2024-07-02 MED ORDER — OXYCODONE HCL 5 MG PO TABS
5.0000 mg | ORAL_TABLET | ORAL | Status: AC
  Administered 2024-07-02: 5 mg via ORAL
  Filled 2024-07-02: qty 1

## 2024-07-02 MED ORDER — OXYCODONE HCL 5 MG PO TABS: 5.0000 mg | ORAL_TABLET | Freq: Four times a day (QID) | ORAL | 0 refills | Status: DC | PRN

## 2024-07-02 NOTE — ED Notes (Signed)
 ED Provider at bedside.

## 2024-07-02 NOTE — ED Notes (Signed)
 Dressings placed to bilateral lower extremities including tefla, abd pad, and kerlex.

## 2024-07-02 NOTE — Discharge Instructions (Signed)
 Please follow-up closely with your nephrologist at hemodialysis tomorrow.  Return to the ER right away if you have severe pain, cold or blue foot, fever, your symptoms are progressing or other concerns arise.  Follow-up closely with your wound care team.  No driving today do not drive within 8 hours of use of oxycodone .  And never take more than prescribed.

## 2024-07-02 NOTE — ED Triage Notes (Addendum)
 Pt comes with bilateral leg pain. 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 Pt has been taking tylenol  and several other pain meds with no relief. Pt states she can't stay in this pain.   Pt has several wounds noted to legs.

## 2024-07-02 NOTE — ED Provider Notes (Signed)
 [facility] Provider Note    Event Date/Time   First MD Initiated Contact with Patient 07/02/24 RONOLD     (approximate)   History   Leg Pain  HPI  [name] is a 58 y.o. 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  Reviewed note by Dr. Bethena of wound team from 10-29 this year.   She has had persistent pain in her legs now for several months.  She reports is very difficult.  At times she has been taking upwards of 10 g of Tylenol  daily, but not the last day.  She was last took 2 Tylenol  tablets about 1 hour before she came to the ER.  Her husband had asked her to reduce.  She has not had any abdominal pain no nausea or vomiting.  None of it was intentional but she reports she is treating pain.  In the past she taken oxycodone  with good relief.  She follows with the wound clinic.  Reports that she is doing daily bandage changes following with wound clinic regularly.  Her wounds are feeling better no fevers the seem to be slowly healing.  She has had a purpleish color to her foot now for several weeks and has been evaluated for that as well and has received about the diagnosis of calciphylaxis  Physical Exam   Triage Vital Signs: ED Triage Vitals  Encounter Vitals Group     BP 07/02/24 1801 (!) 106/59     Girls Systolic BP Percentile --      Girls Diastolic BP Percentile --      Boys Systolic BP Percentile --      Boys Diastolic BP Percentile --      Pulse Rate 07/02/24 1801 87     Resp 07/02/24 1801 18     Temp 07/02/24 1801 (!) 97.5 F (36.4 C)     Temp src --      SpO2 07/02/24 1801 95 %     Weight 07/02/24 1758 178 lb 9.2 oz (81 kg)     Height 07/02/24 1758 5' 7 (1.702 m)     Head Circumference --      Peak Flow --      Pain Score 07/02/24 1756 10     Pain Loc --      Pain Education --      Exclude from  Growth Chart --     Most recent vital signs: Vitals:   07/02/24 1801  BP: (!) 106/59  Pulse: 87  Resp: 18  Temp: (!) 97.5 F (36.4 C)  SpO2: 95%     General: Awake, no distress.  She is very pleasant accompanied by husband CV:  Good peripheral perfusion.  Dopplerable pulses in the dorsalis pedis in the feet bilaterally Resp:  Normal effort.  Abd:  No distention.  Soft nontender nondistended Other:  Is no jaundice of the skin.  She is awake alert and oriented.  See clinical media upload.  She has several ulcerations over her lower legs that appear chronic she reports actually appear improved and there is no worsening of redness swelling, but the pain is quite chronic very painful and she reports she is feeling tired of it and feels like she needs something stronger than Tylenol .  She has a received a referral to pain management but has not been able to see them  yet and advises her nephrologist and the wound clinic both advised they do not provide any pain prescriptions of her   ED Results / Procedures / Treatments   Labs (all labs ordered are listed, but only abnormal results are displayed) Labs Reviewed  CBC - Abnormal; Notable for the following components:      Result Value   MCV 100.2 (*)    RDW 18.7 (*)    All other components within normal limits  BASIC METABOLIC PANEL WITH GFR - Abnormal; Notable for the following components:   Chloride 88 (*)    CO2 17 (*)    BUN 49 (*)    Creatinine, Ser 7.40 (*)    Calcium  11.1 (*)    GFR, Estimated 6 (*)    Anion gap 34 (*)    All other components within normal limits  LACTIC ACID, PLASMA - Abnormal; Notable for the following components:   Lactic Acid, Venous 2.3 (*)    All other components within normal limits  ACETAMINOPHEN  LEVEL - Abnormal; Notable for the following components:   Acetaminophen  (Tylenol ), Serum 31 (*)    All other components within normal limits  HEPATIC FUNCTION PANEL - Abnormal; Notable for the following  components:   AST 14 (*)    Alkaline Phosphatase 182 (*)    All other components within normal limits  PROTIME-INR - Abnormal; Notable for the following components:   Prothrombin Time 15.8 (*)    All other components within normal limits   Labs reviewed discussed with Dr. Marcelino with nephrology.  He is going to see her tomorrow actually as he will be in the nephrology clinic/hemodialysis center.  He reviewed her labs do notes that the elevated anion gap and slightly elevated lactate are not concerning especially the patient is due for hemodialysis in the morning.  He reports that her labs appear overall quite reassuring and appropriate for outpatient follow-up  Normal white count afebrile status reassuring against acute infection as well  RADIOLOGY  Discussed with patient she reports that her symptoms are very chronic she has had multiple studies to exclude DVT and has had no new swelling or pain beyond her typical.  She had venous ultrasound with no DVT as her recent September 1   PROCEDURES:  Critical Care performed: No  Procedures   MEDICATIONS ORDERED IN ED: Medications  oxyCODONE  (Oxy IR/ROXICODONE ) immediate release tablet 5 mg (5 mg Oral Given 07/02/24 1909)     IMPRESSION / MDM / ASSESSMENT AND PLAN / ED COURSE  I reviewed the triage vital signs and the nursing notes.                              Differential diagnosis includes, but is not limited to, calciphylaxis, vascular skin wounds, possible skin infections though based on her clinical history notes from wound care clinic and review of old records as well as the images and history she provides I suspect this is likely calciphylaxis but this is not my area of expertise but I do not see evidence of acute superinfection based on her history.  Her primary concern is ongoing pain.  She will stop Tylenol .  I am willing to provide her a brief course of oxycodone , Dr. Lazarus her nephrologist advised he will actually make  urgent referral to the pain clinic when he sees her tomorrow and try to get her in soon as this is obviously a painful condition that is  quite chronic  Patient's presentation is most consistent with acute complicated illness / injury requiring diagnostic workup.      Clinical Course as of 07/02/24 2146  [place] Jul 02, 2024  2114 Patient and her husband show me pictures, and they report that her wounds are actually markedly improved.  Based on the pictures they do show me it does appear that her wounds are improving.  Lid do look certainly ulcerated with some serosanguinous drainage but based on her review of notes from wound care as well as the pictures they provide the history provided it seems that these wounds are very chronic and improving. [MQ]  2115 Her LFTs are reassuring without evidence of acute liver injury, her acetaminophen  level is appropriate.  She reports her last Tylenol  was just about an hour prior to coming to ER given roughly a 4-hour level that is well below a toxic dose.  Counseled her extensively she reports neurology reports are going to discontinue Tylenol  altogether [MQ]  2115 Patient agreeable to receiving a prescription for oxycodone  which she will use sparingly.  Only as prescribed.  She has been referred to pain management, follows with wound care, has hemodialysis planned tomorrow.  No fevers or chills no infectious symptoms.  At this point I think she is suffering from chronic wounds pain related to calciphylaxis [MQ]  2115 We discussed very careful return precautions.  Patient not driving, will not take more than 1 tablet every 6 hours.  She will be following closely has hemodialysis planned in the morning [MQ]    Clinical Course User Index [MQ] Dicky Anes, MD   I will prescribe the patient a narcotic pain medicine due to their condition which I anticipate will cause at least moderate pain short term. I discussed with the patient safe use of narcotic pain medicines,  and that they are not to drive, work in dangerous areas, or ever take more than prescribed (no more than 1 pill every 6 hours). We discussed that this is the type of medication that can be  overdosed on and the risks of this type of medicine. Patient is very agreeable to only use as prescribed and to never use more than prescribed.  Return precautions and treatment recommendations and follow-up discussed with the patient who is agreeable with the plan.   FINAL CLINICAL IMPRESSION(S) / ED DIAGNOSES   Final diagnoses:  Ulcer of left lower extremity, limited to breakdown of skin (HCC)  Ulcer of right lower extremity, limited to breakdown of skin (HCC)   Calciphylaxis  Rx / DC Orders   ED Discharge Orders          Ordered    oxyCODONE  (OXY IR/ROXICODONE ) 5 MG immediate release tablet  Every 6 hours PRN        07/02/24 2146             Note:  This document was prepared using Dragon voice recognition software and may include unintentional dictation errors.   Dicky Anes, MD 07/02/24 2146

## 2024-07-03 ENCOUNTER — Ambulatory Visit: Attending: Nurse Practitioner | Admitting: Student in an Organized Health Care Education/Training Program

## 2024-07-03 VITALS — BP 86/54 | HR 84 | Temp 98.1°F | Resp 16 | Ht 67.0 in | Wt 176.4 lb

## 2024-07-03 DIAGNOSIS — Z79899 Other long term (current) drug therapy: Secondary | ICD-10-CM | POA: Insufficient documentation

## 2024-07-03 DIAGNOSIS — M79605 Pain in left leg: Secondary | ICD-10-CM | POA: Insufficient documentation

## 2024-07-03 DIAGNOSIS — N186 End stage renal disease: Secondary | ICD-10-CM | POA: Diagnosis present

## 2024-07-03 DIAGNOSIS — Z992 Dependence on renal dialysis: Secondary | ICD-10-CM | POA: Insufficient documentation

## 2024-07-03 DIAGNOSIS — Z8673 Personal history of transient ischemic attack (TIA), and cerebral infarction without residual deficits: Secondary | ICD-10-CM | POA: Insufficient documentation

## 2024-07-03 DIAGNOSIS — E1142 Type 2 diabetes mellitus with diabetic polyneuropathy: Secondary | ICD-10-CM | POA: Diagnosis present

## 2024-07-03 DIAGNOSIS — G894 Chronic pain syndrome: Secondary | ICD-10-CM | POA: Diagnosis present

## 2024-07-03 DIAGNOSIS — M79604 Pain in right leg: Secondary | ICD-10-CM | POA: Diagnosis not present

## 2024-07-03 DIAGNOSIS — M130 Polyarthritis, unspecified: Secondary | ICD-10-CM | POA: Diagnosis not present

## 2024-07-03 DIAGNOSIS — L959 Vasculitis limited to the skin, unspecified: Secondary | ICD-10-CM | POA: Diagnosis present

## 2024-07-03 DIAGNOSIS — Z794 Long term (current) use of insulin: Secondary | ICD-10-CM

## 2024-07-03 DIAGNOSIS — E1122 Type 2 diabetes mellitus with diabetic chronic kidney disease: Secondary | ICD-10-CM

## 2024-07-03 NOTE — Progress Notes (Signed)
 PROVIDER NOTE: Interpretation of information contained herein should be left to medically-trained personnel. Specific patient instructions are provided elsewhere under Patient Instructions section of medical record. This document was created in part using AI and STT-dictation technology, any transcriptional errors that may result from this process are unintentional.  Patient: [name]  Service: E/M Encounter  Provider: Wallie Sherry, MD  DOB: [birth year]  Delivery: Face-to-face  Specialty: Interventional Pain Management  MRN: [id number]  Setting: Ambulatory outpatient facility  Specialty designation: 09  Type: New Patient  Location: Outpatient office facility  PCP: Patient, No Pcp Per  DOS: 07/03/2024    Referring Prov.: Sherry Munsoor, MD   Primary Reason(s) for Visit: Encounter for initial evaluation of one or more chronic problems (new to examiner) potentially causing chronic pain, and posing a threat to normal musculoskeletal function. (Level of risk: High) CC: Leg Pain (bilateral)  HPI  [name] is a 58 y.o. year old, female patient, who comes for the first time to our practice referred by Lateef, Munsoor, MD for our initial evaluation of her chronic pain. She has Stage 5 chronic renal impairment associated with type 2 diabetes mellitus (HCC); Uncontrolled hypertension; Dyslipidemia; Hyperphosphatemia; Metabolic acidosis; Secondary hyperparathyroidism of renal origin; Diabetic neuropathy (HCC); ESRD on dialysis (HCC); Type 1 diabetes mellitus (HCC); Femur fracture, left (HCC); Essential hypertension; Fracture, proximal femur, right, closed, initial encounter (HCC); Quadriceps tendon rupture; Intertrochanteric fracture of left hip (HCC); ESRD on hemodialysis (HCC); Acute on chronic anemia; Diabetic peripheral neuropathy (HCC); Anemia of chronic disease; History of fracture of left hip; Non-healing surgical wound of left groin; Wound infection; Osteoporosis with current pathological fracture;  Fracture of proximal end of right femur, closed, initial encounter (HCC); Constipation; CHB (complete heart block) (HCC); SOB (shortness of breath); Leg edema; Symptomatic bradycardia; Complete heart block (HCC); Elevated troponin; Fluid overload; Type 2 diabetes mellitus with end-stage renal disease (HCC); History of CVA (cerebrovascular accident); HFrEF (heart failure with reduced ejection fraction) (HCC); Mitral regurgitation; Polyarthritis; CHF (congestive heart failure) (HCC); PAD (peripheral artery disease); Leg swelling; Pain in both lower extremities; Cutaneous vasculitis; Chronic pain syndrome; Medication management; and 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 Today she comes in for evaluation of her Leg Pain (bilateral)  Pain Assessment: Location: Right, Left Leg Radiating: Stays in lower legs Onset:  (3 months ago) Duration: Chronic pain Quality: Aching, Sharp, Restless, Sore, Discomfort, Crying, Moaning Severity: 10-Worst pain ever/10 (subjective, self-reported pain score)  Effect on ADL: Nothing Takes a lot of Tylenol  pills and liquid Timing: Constant Modifying factors: Nothing BP: (!) 86/54  HR: 84  Onset and Duration: Present longer than 3 months Cause of pain: Unknown Severity: Getting worse, NAS-11 at its worse: 10/10, NAS-11 at its best: 10/10, NAS-11 now: 10/10, and NAS-11 on the average: 10/10 Timing: Not influenced by the time of the day Aggravating Factors: Bending, Lifiting, and Motion Alleviating Factors: Resting, Sitting, and Relaxation therapy Associated Problems: Day-time cramps, Night-time cramps, Fatigue, and Sadness Quality of Pain: Aching, Agonizing, Annoying, Deep, Disabling, Distressing, Dreadful, Dull, Exhausting, and Fearful Previous Examinations or Tests: CT scan and X-rays Previous Treatments: Narcotic medications  [name] is being evaluated for possible interventional pain management therapies for the treatment of her chronic pain.   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  She is unable to tolerate NSAIDs.  She states that acetaminophen  is not helpful. Avoid tramadol  given that the patient is end-stage renal  disease on dialysis. She states that her pain is most severe in the evening. She does not recall having tried gabapentin or Lyrica which could be options for her in the future, renally dosed  Historic Controlled Substance Pharmacotherapy Review PMP and historical list of controlled substances: Oxycodone  Hcl (Ir) 5 Mg Tablet  Most recently prescribed controlled substance(s): Opioid Analgesic: Oxycodone  Hcl (Ir) 5 Mg Tablet  MME/day: 32  Historical Monitoring: The patient  reports no history of drug use. List of prior UDS Testing: No results found for: MDMA, COCAINSCRNUR, PCPSCRNUR, PCPQUANT, CANNABQUANT, THCU, ETH, CBDTHCR, D8THCCBX, D9THCCBX Historical Background Evaluation: [place] PMP: PDMP reviewed during this encounter. Review of the past 33-months conducted.             PMP NARX Score Report:  Narcotic: 200 Sedative: 090 Stimulant: 000 [place] Department of public safety, offender search: Engineer, Mining Information) Non-contributory Risk Assessment Profile: Aberrant behavior: None observed or detected today Risk factors for fatal opioid overdose: None identified today PMP NARX Overdose Risk Score: 300 Fatal overdose hazard ratio (HR): Calculation deferred Non-fatal overdose hazard ratio (HR): Calculation deferred Risk of opioid abuse or dependence: 0.7-3.0% with doses <= 36 MME/day and 6.1-26% with doses >= 120 MME/day. Substance use disorder (SUD) risk level: See below Personal History of Substance Abuse (SUD-Substance use disorder):  Alcohol: Negative  Illegal Drugs: Negative  Rx Drugs: Negative  ORT Risk Level calculation: Low Risk  Opioid Risk Tool - 07/03/24 1359        Personal History of Substance Abuse   Alcohol Negative    Illegal Drugs Negative    Rx Drugs Negative      History of Preadolescent Sexual Abuse   History of Preadolescent Sexual Abuse Negative or Female      Psychological Disease   Psychological Disease Negative    Depression Negative      Total Score   Opioid Risk Tool Scoring 0    Opioid Risk Interpretation Low Risk         ORT Scoring interpretation table:  Score <3 = Low Risk for SUD  Score between 4-7 = Moderate Risk for SUD  Score >8 = High Risk for Opioid Abuse   PHQ-2 Depression Scale:  Total score: 0  PHQ-2 Scoring interpretation table: (Score and probability of major depressive disorder)  Score 0 = No depression  Score 1 = 15.4% Probability  Score 2 = 21.1% Probability  Score 3 = 38.4% Probability  Score 4 = 45.5% Probability  Score 5 = 56.4% Probability  Score 6 = 78.6% Probability   PHQ-9 Depression Scale:  Total score: 6  PHQ-9 Scoring interpretation table:  Score 0-4 = No depression  Score 5-9 = Mild depression  Score 10-14 = Moderate depression  Score 15-19 = Moderately severe depression  Score 20-27 = Severe depression (2.4 times higher risk of SUD and 2.89 times higher risk of overuse)   Pharmacologic Plan: As per protocol, I have not taken over any controlled substance management, pending the results of ordered tests and/or consults.            Initial impression: Pending review of available data and ordered tests.  Meds   Current Outpatient Medications:    albuterol  (VENTOLIN  HFA) 108 (90 Base) MCG/ACT inhaler, Inhale 2 puffs into the lungs every 6 (six) hours as needed for wheezing or shortness of breath., Disp: 8 g, Rfl: 2   ipratropium-albuterol  (DUONEB) 0.5-2.5 (3) MG/3ML SOLN, Take 3 mLs by nebulization 4 (four) times daily., Disp: ,  Rfl:    midodrine (PROAMATINE) 5 MG tablet, Take 5 mg by mouth 3 (three) times a week. Takes prior to dialysis and may take an additional dose as needed in  dialysis, Disp: , Rfl:    NOVOLOG  FLEXPEN 100 UNIT/ML FlexPen, Inject 14 Units into the skin 3 (three) times daily with meals., Disp: , Rfl:    oxyCODONE  (OXY IR/ROXICODONE ) 5 MG immediate release tablet, Take 1 tablet (5 mg total) by mouth every 6 (six) hours as needed for severe pain (pain score 7-10)., Disp: 16 tablet, Rfl: 0   pentoxifylline  (TRENTAL ) 400 MG CR tablet, Take 1 tablet (400 mg total) by mouth daily., Disp: 30 tablet, Rfl: 2   sevelamer  carbonate (RENVELA ) 800 MG tablet, Take 2,400 mg by mouth 3 (three) times daily with meals., Disp: , Rfl:    SODIUM THIOSULFATE IV, Inject 12.5 mg into the vein 3 (three) times a week., Disp: , Rfl:    Vitamin D , Ergocalciferol , (DRISDOL ) 1.25 MG (50000 UNIT) CAPS capsule, Take 1 capsule (50,000 Units total) by mouth every 7 (seven) days., Disp: 5 capsule, Rfl: 0   TRELEGY ELLIPTA 200-62.5-25 MCG/ACT AEPB, Inhale 1 puff into the lungs daily., Disp: , Rfl:   Imaging Review  CT HIP RIGHT WO CONTRAST  Narrative CLINICAL DATA:  Initial evaluation for acute trauma, fracture.  EXAM: CT OF THE RIGHT HIP WITHOUT CONTRAST  TECHNIQUE: Multidetector CT imaging of the right hip was performed according to the standard protocol. Multiplanar CT image reconstructions were also generated.  RADIATION DOSE REDUCTION: This exam was performed according to the departmental dose-optimization program which includes automated exposure control, adjustment of the mA and/or kV according to patient size and/or use of iterative reconstruction technique.  COMPARISON:  Prior radiograph from earlier the same day.  FINDINGS: Bones/Joint/Cartilage  Acute transverse fracture seen extending through the right femoral neck with superior subluxation. No significant comminution. Extension through the greater trochanter noted (series 9, image 63). Femoral head remains intact. No subtrochanteric extension. Probable associated acute nondisplaced fracture with impaction at  the acetabular cup (series 4, image 113). Remainder of the acetabulum otherwise grossly intact. Remotely healed fracture of the inferior left pubic ramus. Pubis symphysis is widened up to 1.6 cm. Remainder of the visualized bony pelvis otherwise intact. No discrete or worrisome osseous lesions.  Ligaments  Suboptimally assessed by CT.  Muscles and Tendons  Visualized musculature intact.  Soft tissues  Soft tissue swelling and/or hemorrhage present about the acute femoral neck fracture. Suspected mild contusion within the subcutaneous fat overlying the hip/proximal femur.  Moderate to large volume stool within the distal colon, suggesting constipation. Remainder of the visualized bowel unremarkable. Appendix within normal limits. Partially visualized bladder grossly intact. Prominent vascular calcifications noted within the pelvis. No visible adenopathy. Small fat containing right inguinal hernia noted.  IMPRESSION: 1. Acute transverse fracture extending through the right femoral neck with extension through the greater trochanter. No significant comminution or subtrochanteric extension. 2. Question subtle associated acute nondisplaced impaction fracture at the acetabular cup. 3. Widening of the pubic symphysis up to 1.6 cm, age indeterminate. 4. Remotely healed fracture of the inferior left pubic ramus. 5. Moderate to large volume stool within the distal colon, suggesting constipation.   Electronically Signed By: Morene Hoard M.D. On: 12/14/2021 03:59  Hip-L CT wo contrast: Results for orders placed during the hospital encounter of 09/28/21  CT Hip Left Wo Contrast  Narrative CLINICAL DATA:  Fall, left hip fracture  EXAM: CT OF THE LEFT  HIP WITHOUT CONTRAST  TECHNIQUE: Multidetector CT imaging of the left hip was performed according to the standard protocol. Multiplanar CT image reconstructions were also generated.  RADIATION DOSE REDUCTION: This exam  was performed according to the departmental dose-optimization program which includes automated exposure control, adjustment of the mA and/or kV according to patient size and/or use of iterative reconstruction technique.  COMPARISON:  X-ray 09/28/2021, CT 06/09/2019  FINDINGS: Bones/Joint/Cartilage  Marked diffuse osteopenia. Acute transcervical fracture through the left femoral neck with significant varus angulation. No definite intertrochanteric involvement. Hip joint alignment is maintained without dislocation. Acute nondisplaced fracture of the right inferior pubic ramus (series 2, image 91). No additional fractures are identified. Moderate arthropathy of the pubic symphysis with new erosive or resorptive changes of the bilateral pubic bones. Symphysis is mildly widened compared to previous CT.  Ligaments  Suboptimally assessed by CT.  Muscles and Tendons  No acute musculotendinous abnormality by CT.  Soft tissues  Mild soft tissue swelling at the femoral neck fracture site. No organized hematoma. No acute findings within the visualized portion of the left hemipelvis.  IMPRESSION: 1. Acute transcervical fracture through the LEFT femoral neck with varus angulation. 2. Acute nondisplaced fracture of the RIGHT inferior pubic ramus. 3. Moderate arthropathy of the pubic symphysis with new resorptive or erosive changes of the bilateral pubic bones. This may be secondary to underlying renal osteodystrophy. Septic arthritis could have this appearance in the appropriate clinical setting. Mild widening at the pubic symphysis is favored secondary to underlying arthropathy/resorption rather than posttraumatic diastasis. 4. Marked bony demineralization.   Electronically Signed By: Mabel Converse D.O. On: 09/28/2021 18:56  Hip-R DG 2-3 views: Results for orders placed during the hospital encounter of 12/14/21  DG Hip Unilat W or Wo Pelvis 2-3 Views  Right  Narrative CLINICAL DATA:  Right hip pain.  EXAM: DG HIP (WITH OR WITHOUT PELVIS) 2-3V RIGHT  COMPARISON:  None.  FINDINGS: A fracture deformity is seen extending through the neck of the proximal right femur. Approximately 1 shaft width dorsal and lateral displacement of the distal fracture site is seen. There is no evidence of dislocation. An intact left hip replacement is noted.  IMPRESSION: Acute fracture of the proximal right femur.   Electronically Signed By: Suzen Dials M.D. On: 12/14/2021 01:37  Hip-L DG 2-3 views: Results for orders placed during the hospital encounter of 10/16/21  DG HIP UNILAT WITH PELVIS 2-3 VIEWS LEFT  Narrative CLINICAL DATA:  Left hip  REPAIR QUADRICEP TENDON  EXAM: DG HIP (WITH OR WITHOUT PELVIS) 2-3V LEFT  COMPARISON:  X-ray left hip 10/02/2021  FINDINGS: Intraoperative repair of the quadriceps tendon per history.  One low resolution intraoperative spot views of the left hip were obtained in a patient status post total left hip arthroplasty. No fracture visible on the limited views.  Total fluoroscopy time: 10.3 seconds  Total radiation dose: 1.8 mGy  IMPRESSION: Intraoperative repair of the quadriceps tendon in a patient status post total left hip arthroplasty.   Electronically Signed By: Morgane  Naveau M.D. On: 10/16/2021 17:12  MR KNEE RIGHT WO CONTRAST  Narrative CLINICAL DATA:  Right knee pain after fall.  Abnormal x-ray  EXAM: MRI OF THE RIGHT KNEE WITHOUT CONTRAST  TECHNIQUE: Multiplanar, multisequence MR imaging of the knee was performed. No intravenous contrast was administered.  COMPARISON:  X-ray 10/07/2021  FINDINGS: Technical Note: Despite efforts by the technologist and patient, motion artifact is present on today's exam and could not be eliminated. This  reduces exam sensitivity and specificity.  MENISCI  Medial meniscus:  Intact.  Lateral meniscus:   Intact.  LIGAMENTS  Cruciates: Intact ACL and PCL.  Collaterals: Intact MCL. Lateral collateral ligament complex intact.  CARTILAGE  Patellofemoral: Irregular full-thickness cartilage loss involving the superior aspect of the lateral and medial patellar facets, which may be degenerative or posttraumatic. High-grade near-full thickness cartilage defect within the central trochlear groove measuring approximately 11 x 5 mm.  Medial: Mild diffuse chondral thinning of the weight-bearing medial compartment.  Lateral:  No chondral defect.  MISCELLANEOUS  Joint: Large complex knee joint effusion. Thickened, edematous appearance of the suprapatellar fat pad.  Popliteal Fossa:  No Baker's cyst. Intact popliteus tendon.  Extensor Mechanism: High-grade, near full-thickness tear of the distal quadriceps tendon with less than 1 cm of retraction. The superficial most aspect of the distal tendon remains intact. Low signal cortical avulsion fragments are also avulsed from the superior cortex of the patella (series 12, image 17). Laxity of the patellar tendon without tear.  Bones: Cortical avulsion of the superior pole of the patella. Patella baja alignment. Patchy marrow edema within the distal femur and proximal tibia are nonspecific and could be posttraumatic or related to osteopenia. Previously described area of relative decreased density in the proximal tibial metaphysis is consistent with fatty bone marrow. Similar findings are seen within the distal femoral metaphysis (series 9, image 17). No suspicious marrow replacing bone lesion.  Other: Diffuse soft tissue swelling and edema with small amount of ill-defined hemorrhage along the anterolateral aspect of the knee.  IMPRESSION: 1. High-grade, near full-thickness mildly retracted tear of the distal quadriceps tendon with associated cortical avulsion of the superior pole of the patella. 2. Large complex knee joint effusion. 3.  Patchy marrow edema within the distal femur and proximal tibia are nonspecific and could be posttraumatic or related to osteopenia. 4. Previously described area of decreased density in the proximal tibial metaphysis is consistent with fatty bone marrow. No suspicious marrow replacing bone lesion. 5. 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 6. Intact menisci.  Intact cruciate and collateral ligaments.   Electronically Signed By: Mabel Converse D.O. On: 10/09/2021 08:18  DG Ankle Complete Left  Narrative EXAM: 3 or more VIEW(S) XRAY OF THE LEFT ANKLE 03/22/2024 05:13:09 AM  CLINICAL HISTORY: Left ankle pain and swelling with no injury.  COMPARISON: None available.  FINDINGS:  BONES AND JOINTS: No acute fracture. No focal osseous lesion. No joint dislocation. Spurring is present at the achilles tendon insertion.  SOFT TISSUES: Diffuse soft tissue swelling is present about the ankle. Microvascular calcifications are present.  IMPRESSION: 1. Diffuse soft tissue swelling about the ankle. 2. No acute osseous abnormality.  Electronically signed by: Lonni Necessary MD 03/22/2024 05:45 AM EDT RP Workstation: [id number]  DG Foot Complete Left  Narrative CLINICAL DATA:  Beginning to walk after knee surgery and felt pain and swelling in left foot.  EXAM: LEFT FOOT - COMPLETE 3+ VIEW  COMPARISON:  None.  FINDINGS: There is diffuse decreased bone mineralization. Mild joint space narrowing of the interphalangeal joints diffusely. Mild dorsal tarsometatarsal degenerative osteophytes seen on lateral view. Small plantar calcaneal heel spur. Mild chronic spurring at the Achilles insertion on the calcaneus. Mild posterior distal tibial plafond degenerative osteophytosis. Vascular calcifications are noted.  Mild dorsal forefoot soft tissue swelling. Within the limitations of decreased bone  mineralization, no definite acute fracture is visualized, however it is difficult to exclude a possible nondisplaced fracture  seen on oblique view only within the proximal metaphysis of the proximal phalanx of fourth toe given overlapping bones versus minimal cortical step-off medially. No dislocation.  IMPRESSION: Diffuse decreased bone mineralization limits evaluation for an acute fracture. There is soft tissue swelling of the dorsal aspect of the forefoot. No definitive fracture line is seen, however on oblique view there is question of minimal lucency and cortical step-off that may represent normal overlap of bones versus a tiny nondisplaced fracture. Recommend clinical correlation for point tenderness.   Electronically Signed By: Tanda Lyons M.D. On: 10/31/2021 10:35  Complexity Note: Imaging results reviewed.                         ROS  Cardiovascular: Heart trouble, Abnormal heart rhythm, and Pacemaker or defibrillator Pulmonary or Respiratory: Lung problems Neurological: No reported neurological signs or symptoms such as seizures, abnormal skin sensations, urinary and/or fecal incontinence, being born with an abnormal open spine and/or a tethered spinal cord Psychological-Psychiatric: Anxiousness and No reported psychological or psychiatric signs or symptoms such as difficulty sleeping, anxiety, depression, delusions or hallucinations (schizophrenial), mood swings (bipolar disorders) or suicidal ideations or attempts Gastrointestinal: No reported gastrointestinal signs or symptoms such as vomiting or evacuating blood, reflux, heartburn, alternating episodes of diarrhea and constipation, inflamed or scarred liver, or pancreas or irrregular and/or infrequent bowel movements Genitourinary: No reported renal or genitourinary signs or symptoms such as difficulty voiding or producing urine, peeing blood, non-functioning kidney, kidney stones, difficulty emptying the bladder,  difficulty controlling the flow of urine, or chronic kidney disease Hematological: Brusing easily Endocrine: High blood sugar requiring insulin  (IDDM) Rheumatologic: No reported rheumatological signs and symptoms such as fatigue, joint pain, tenderness, swelling, redness, heat, stiffness, decreased range of motion, with or without associated rash Musculoskeletal: Negative for myasthenia gravis, muscular dystrophy, multiple sclerosis or malignant hyperthermia Work History: Disabled  Allergies  [name] is allergic to enalapril, ivp dye [iodinated contrast media], lisinopril, and prednisone .  Laboratory Chemistry Profile   Renal Lab Results  Component Value Date   BUN 49 (H) 07/02/2024   CREATININE 7.40 (H) 07/02/2024   GFRAA 8 (L) 12/06/2017   GFRNONAA 6 (L) 07/02/2024     Electrolytes Lab Results  Component Value Date   NA 139 07/02/2024   K 4.6 07/02/2024   CL 88 (L) 07/02/2024   CALCIUM  11.1 (H) 07/02/2024   MG 2.0 05/01/2024   PHOS 3.3 05/01/2024     Hepatic Lab Results  Component Value Date   AST 14 (L) 07/02/2024   ALT 11 07/02/2024   ALBUMIN 3.7 07/02/2024   ALKPHOS 182 (H) 07/02/2024     ID Lab Results  Component Value Date   HIV Non Reactive 03/03/2024   SARSCOV2NAA NEGATIVE 09/28/2021   STAPHAUREUS NEGATIVE 12/14/2021   MRSAPCR NEGATIVE 12/14/2021   HCVAB NON REACTIVE 05/03/2024     Bone Lab Results  Component Value Date   VD25OH 23.67 (L) 09/29/2021     Endocrine Lab Results  Component Value Date   GLUCOSE 87 07/02/2024   HGBA1C 9.8 (H) 03/03/2024   TSH 2.406 03/02/2024     Neuropathy Lab Results  Component Value Date   VITAMINB12 1,005 (H) 03/04/2024   FOLATE 9.5 03/04/2024   HGBA1C 9.8 (H) 03/03/2024   HIV Non Reactive 03/03/2024     CNS No results found for: COLORCSF, APPEARCSF, RBCCOUNTCSF, WBCCSF, POLYSCSF, LYMPHSCSF, EOSCSF, PROTEINCSF, GLUCCSF, JCVIRUS, CSFOLI, IGGCSF, LABACHR, ACETBL   Inflammation  (CRP:  Acute  ESR: Chronic) Lab Results  Component Value Date   CRP 1.7 (H) 05/01/2024   ESRSEDRATE 3 05/01/2024   LATICACIDVEN 2.3 (HH) 07/02/2024     Rheumatology Lab Results  Component Value Date   RF 10.8 05/03/2024   ANA Negative 05/03/2024   LABURIC 4.0 03/22/2024     Coagulation Lab Results  Component Value Date   INR 1.2 07/02/2024   LABPROT 15.8 (H) 07/02/2024   APTT 30 12/06/2017   PLT 301 07/02/2024   DDIMER 0.82 (H) 03/19/2024     Cardiovascular Lab Results  Component Value Date   BNP 2,615.5 (H) 05/02/2024   HGB 12.6 07/02/2024   HCT 40.5 07/02/2024     Screening Lab Results  Component Value Date   SARSCOV2NAA NEGATIVE 09/28/2021   STAPHAUREUS NEGATIVE 12/14/2021   MRSAPCR NEGATIVE 12/14/2021   HCVAB NON REACTIVE 05/03/2024   HIV Non Reactive 03/03/2024     Cancer No results found for: CEA, CA125, LABCA2   Allergens No results found for: ALMOND, APPLE, ASPARAGUS, AVOCADO, BANANA, BARLEY, BASIL, BAYLEAF, GREENBEAN, LIMABEAN, WHITEBEAN, BEEFIGE, REDBEET, BLUEBERRY, BROCCOLI, CABBAGE, MELON, CARROT, CASEIN, CASHEWNUT, CAULIFLOWER, CELERY     Note: Lab results reviewed.  PFSH  Drug: [name]  reports no history of drug use. Alcohol:  reports no history of alcohol use. Tobacco:  reports that she has never smoked. She has never used smokeless tobacco. Medical:  has a past medical history of Anemia, Anxiety, Calciphylaxis, Chronic kidney disease, Diabetes mellitus without complication (HCC), GERD (gastroesophageal reflux disease), High serum parathyroid hormone (PTH), History of kidney stones (2000), Hypertension, Neuromuscular disorder (HCC), PONV (postoperative nausea and vomiting), and Stroke (HCC) (10/2017). Family: family history includes Alcohol abuse in her brother; COPD in her mother; Cancer in her father and paternal aunt; Cardiomyopathy in her brother; Diabetes in her brother and sister; Eczema in  her sister; Emphysema in her mother; Hypertension in her brother and sister.  Past Surgical History:  Procedure Laterality Date   ABDOMINAL HYSTERECTOMY  2007   APPLICATION OF WOUND VAC Left 11/15/2021   Procedure: APPLICATION OF WOUND VAC;  Surgeon: Krasinski, Kevin, MD;  Location: [facility];  Service: Orthopedics;  Laterality: Left;  Prevena 13cm    APPLICATION OF WOUND VAC Right 12/14/2021   Procedure: APPLICATION OF WOUND VAC;  Surgeon: Tobie Priest, MD;  Location: [facility];  Service: Orthopedics;  Laterality: Right;  [id number]   AV FISTULA INSERTION W/ RF MAGNETIC GUIDANCE N/A 12/08/2017   Procedure: AV FISTULA INSERTION W/RF MAGNETIC GUIDANCE;  Surgeon: Jama Cordella MATSU, MD;  Location: [facility];  Service: Cardiovascular;  Laterality: N/A;   DIALYSIS/PERMA CATHETER INSERTION Right 10/03/2021   Procedure: DIALYSIS/PERMA CATHETER INSERTION;  Surgeon: Jama Cordella MATSU, MD;  Location: [facility];  Service: Cardiovascular;  Laterality: Right;   HIP ARTHROPLASTY Left 09/29/2021   Procedure: ARTHROPLASTY BIPOLAR HIP (HEMIARTHROPLASTY);  Surgeon: Leora Lynwood SAUNDERS, MD;  Location: [facility];  Service: Orthopedics;  Laterality: Left;   HIP ARTHROPLASTY Right 12/14/2021   Procedure: ARTHROPLASTY BIPOLAR HIP (HEMIARTHROPLASTY);  Surgeon: Tobie Priest, MD;  Location: [facility];  Service: Orthopedics;  Laterality: Right;   INCISION AND DRAINAGE HIP Left 11/15/2021   Procedure: IRRIGATION AND DEBRIDEMENT LEFT HIP WOUND;  Surgeon: Marchia Drivers, MD;  Location: [facility];  Service: Orthopedics;  Laterality: Left;   PACEMAKER LEADLESS INSERTION N/A 03/09/2024   Procedure: PACEMAKER LEADLESS INSERTION;  Surgeon: Nancey Eulas BRAVO, MD;  Location: [facility];  Service: Cardiovascular;  Laterality: N/A;  QUADRICEPS TENDON REPAIR Right 10/16/2021   Procedure: REPAIR QUADRICEP TENDON;  Surgeon: Doll Skates, MD;  [facility];  Service: Orthopedics;  Laterality: Right;   ROTATOR CUFF  REPAIR Right 2004   SHOULDER CLOSED REDUCTION Right 2004   UPPER EXTREMITY VENOGRAPHY Left 02/15/2018   Procedure: UPPER EXTREMITY VENOGRAPHY;  Surgeon: Jama Cordella MATSU, MD;  Location: [facility];  Service: Cardiovascular;  Laterality: Left;   Active Ambulatory Problems    Diagnosis Date Noted   Stage 5 chronic renal impairment associated with type 2 diabetes mellitus (HCC) 07/26/2017   Uncontrolled hypertension 07/26/2017   Dyslipidemia 08/08/2015   Hyperphosphatemia 09/29/2016   Metabolic acidosis 09/29/2016   Secondary hyperparathyroidism of renal origin 03/07/2018   Diabetic neuropathy (HCC) 09/27/2018   ESRD on dialysis (HCC) 09/27/2018   Type 1 diabetes mellitus (HCC) 01/03/2019   Femur fracture, left (HCC) 09/28/2021   Essential hypertension 09/28/2021   Fracture, proximal femur, right, closed, initial encounter (HCC) 10/06/2021   Quadriceps tendon rupture 10/16/2021   Intertrochanteric fracture of left hip (HCC) 10/17/2021   ESRD on hemodialysis (HCC)    Acute on chronic anemia    Diabetic peripheral neuropathy (HCC)    Anemia of chronic disease 11/14/2021   History of fracture of left hip 11/15/2021   Non-healing surgical wound of left groin 11/15/2021   Wound infection 11/15/2021   Osteoporosis with current pathological fracture 12/14/2021   Fracture of proximal end of right femur, closed, initial encounter (HCC) 12/14/2021   Constipation 12/14/2021   CHB (complete heart block) (HCC) 03/02/2024   SOB (shortness of breath) 03/02/2024   Leg edema 03/02/2024   Symptomatic bradycardia 03/02/2024   Complete heart block (HCC) 03/02/2024   Elevated troponin 03/03/2024   Fluid overload 03/20/2024   Type 2 diabetes mellitus with end-stage renal disease (HCC) 03/20/2024   History of CVA (cerebrovascular accident) 03/20/2024   HFrEF (heart failure with reduced ejection fraction) (HCC) 03/20/2024   Mitral regurgitation 03/20/2024   Polyarthritis 03/22/2024   CHF  (congestive heart failure) (HCC) 03/22/2024   PAD (peripheral artery disease) 05/01/2024   Leg swelling 05/01/2024   Pain in both lower extremities 05/03/2024   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   Chronic pain syndrome 07/03/2024   Medication management 07/03/2024   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   Resolved Ambulatory Problems    Diagnosis Date Noted   Foot ulcer due to secondary DM (HCC) 03/07/2018   Type 2 diabetes mellitus with stage 5 chronic kidney disease and hypertension (HCC) 03/07/2018   Past Medical History:  Diagnosis Date   Anemia    Anxiety    Chronic kidney disease    Diabetes mellitus without complication (HCC)    GERD (gastroesophageal reflux disease)    High serum parathyroid hormone (PTH)    History of kidney stones 2000   Hypertension    Neuromuscular disorder (HCC)    PONV (postoperative nausea and vomiting)    Stroke (HCC) 10/2017   Constitutional Exam  General appearance: Well nourished, well developed, and well hydrated. In no apparent acute distress Vitals:   07/03/24 1347  BP: (!) 86/54  Pulse: 84  Resp: 16  Temp: 98.1 F (36.7 C)  SpO2: 100%  Weight: 176 lb 5.9 oz (80 kg)  Height: 5' 7 (1.702 m)   BMI Assessment: Estimated body mass index is 27.62 kg/m as calculated from the following:   Height as of this encounter: 5' 7 (1.702 m).   Weight as of this encounter: 176 lb 5.9 oz (80 kg).  BMI interpretation table: BMI level Category Range association with higher incidence of chronic pain  <18 kg/m2 Underweight   18.5-24.9 kg/m2 Ideal body weight   25-29.9 kg/m2 Overweight Increased incidence by 20%  30-34.9 kg/m2 Obese (Class I) Increased incidence by 68%  35-39.9 kg/m2 Severe obesity (Class II) Increased incidence by 136%  >40 kg/m2 Extreme obesity (Class III) Increased incidence by 254%   Patient's current BMI Ideal Body weight  Body mass index is 27.62 kg/m. Ideal body weight: 61.6 kg (135 lb 12.9 oz) Adjusted ideal body weight: 69 kg (152  lb 0.5 oz)   BMI Readings from Last 4 Encounters:  07/03/24 27.62 kg/m  07/02/24 27.97 kg/m  06/22/24 27.97 kg/m  06/12/24 28.31 kg/m   Wt Readings from Last 4 Encounters:  07/03/24 176 lb 5.9 oz (80 kg)  07/02/24 178 lb 9.2 oz (81 kg)  06/22/24 178 lb 9.2 oz (81 kg)  06/12/24 180 lb 12.4 oz (82 kg)    Psych/Mental status: Alert, oriented x 3 (person, place, & time)       Eyes: PERLA Respiratory: No evidence of acute respiratory distress  Patient presents today in wheelchair  Severe sensitivity to touch of lower extremity  Bilateral legs wrapped in leg wrap  Assessment  Primary Diagnosis & Pertinent Problem List: The primary encounter diagnosis was Pain in both lower extremities. Diagnoses of Polyarthritis, Diabetic peripheral neuropathy (HCC), ESRD on hemodialysis (HCC), Cutaneous vasculitis, History of CVA (cerebrovascular accident), Chronic pain syndrome, Medication management, and Calciphylaxis were also pertinent to this visit.  Visit Diagnosis (New problems to examiner): 1. Pain in both lower extremities   2. Polyarthritis   3. Diabetic peripheral neuropathy (HCC)   4. ESRD on hemodialysis (HCC)   5. Cutaneous vasculitis   6. History of CVA (cerebrovascular accident)   7. Chronic pain syndrome   8. Medication management   9. Calciphylaxis    Plan of Care (Initial workup plan)  I had extensive discussion with the patient about the goals of pain management.  We discussed nonpharmacological approaches to pain management that include physical therapy, dieting, sleep hygiene, psychotherapy, interventional therapy.  We discussed the importance of understanding the type of pain including neuropathic, nociceptive, centralized.  I also stressed the importance of multimodal analgesia with an emphasis on nondrug modalities including self management, behavioral health support and physical therapy.  We discussed the importance of physical therapy and how a individualized  physical therapy and occupational therapy program tailored to patient limitations can be helpful at improving physical function. We also discussed the importance of insomnia and disrupted sleep and how improved sleep hygiene and cognitive therapy could be helpful.  Psychotherapy including CBT, mind-body therapies, pain coping strategies can be helpful for patients whose pain impacts mood, sleep, quality of life, relationships with others.  We discussed avoiding benzodiazepines.  I also had an extensive discussion with the patient about interventional therapies which is my expertise and how these could be incorporated into an effective multimodal pain management plan.   End-stage renal disease on dialysis with severe lower-extremity pain from calciphylaxis, refractory to prior analgesics (NSAIDs contraindicated; prior trial not helpful). Today we will obtain a baseline serum toxicology screen; if appropriate, we can trial oxycodone  5 mg twice daily as needed for breakthrough pain (qty 60/month), with opioid stewardship (single prescriber/pharmacy, PDMP checks, naloxone, bowel regimen, no early refills).  She will need to complete a controlled substance agreement with us  at her next visit.  Continue multimodal, renal-safe measures (topical lidocaine /diclofenac  gel  to intact skin, gentle off-loading, wound care), and coordinate with nephrology/wound care for disease-directed therapy (e.g., sodium thiosulfate, calcium /phosphate control).   For adjunct neuropathic relief, consider renally dosed gabapentin or pregabalin (Lyrica) in collaboration with nephrology (post-dialysis dosing, low start/slow titration; monitor for sedation and edema). Reassess pain, function, and safety in 2-4 weeks; adjust dosing or add adjuvants as tolerated.   Lab Orders         Drug Screen 10 W/Conf, Serum     Provider-requested follow-up: Return in about 1 week (around 07/10/2024) for Follow up [place].  Future Appointments   Date Time Provider Department Center  07/05/2024 12:45 PM Bethena Ferraris [place] III, PA-C [facility] None  07/10/2024  3:00 PM Tobie Emmy POUR, NP [facility] None  07/12/2024 11:00 AM Bethena Ferraris [place] III, PA-C [facility] None  07/19/2024 11:00 AM Bethena Ferraris [place] III, PA-C [facility] None  07/25/2024  7:05 AM [facility] DEVICE REMOTES [facility]  07/25/2024  2:00 PM Franchot Isaiah LABOR, MD [facility] [place]  07/26/2024 11:00 AM Bethena Ferraris [place] III, PA-C [facility] None  08/07/2024 11:00 AM Claudene Lehmann, MD [facility] None  10/24/2024  7:05 AM [facility] HVT DEVICE REMOTES [facility]  01/23/2025  7:10 AM [facility] HVT DEVICE REMOTES [facility]  04/24/2025  7:05 AM [facility] DEVICE REMOTES [facility]   I discussed the assessment and treatment plan with the patient. The patient was provided an opportunity to ask questions and all were answered. The patient agreed with the plan and demonstrated an understanding of the instructions.  Patient advised to call back or seek an in-person evaluation if the symptoms or condition worsens.  I personally spent a total of 60 minutes in the care of the patient today including preparing to see the patient, getting/reviewing separately obtained history, performing a medically appropriate exam/evaluation, counseling and educating, placing orders, and documenting clinical information in the EHR.   Note by: Wallie Sherry, MD (TTS and AI technology used. I apologize for any typographical errors that were not detected and corrected.) Date: 07/03/2024; Time: 2:49 PM

## 2024-07-03 NOTE — Progress Notes (Signed)
 Safety precautions to be maintained throughout the outpatient stay will include: orient to surroundings, keep bed in low position, maintain call bell within reach at all times, provide assistance with transfer out of bed and ambulation.

## 2024-07-03 NOTE — Patient Instructions (Signed)
 Pain Management Discharge Instructions  General Discharge Instructions :  If you need to reach your doctor call: Monday-Friday 8:00 am - 4:00 pm at [phone] or toll free [phone].  After clinic hours [phone] to have operator reach doctor.  Bring all of your medication bottles to all your appointments in the pain clinic.  To cancel or reschedule your appointment with Pain Management please remember to call 24 hours in advance to avoid a fee.  Refer to the educational materials which you have been given on: General Risks, I had my Procedure. Discharge Instructions, Post Sedation.  Post Procedure Instructions:  The drugs you were given will stay in your system until tomorrow, so for the next 24 hours you should not drive, make any legal decisions or drink any alcoholic beverages.  You may eat anything you prefer, but it is better to start with liquids then soups and crackers, and gradually work up to solid foods.  Please notify your doctor immediately if you have any unusual bleeding, trouble breathing or pain that is not related to your normal pain.  Depending on the type of procedure that was done, some parts of your body may feel week and/or numb.  This usually clears up by tonight or the next day.  Walk with the use of an assistive device or accompanied by an adult for the 24 hours.  You may use ice on the affected area for the first 24 hours.  Put ice in a Ziploc bag and cover with a towel and place against area 15 minutes on 15 minutes off.  You may switch to heat after 24 hours.

## 2024-07-05 ENCOUNTER — Encounter: Attending: Physician Assistant | Admitting: Physician Assistant

## 2024-07-05 DIAGNOSIS — L97412 Non-pressure chronic ulcer of right heel and midfoot with fat layer exposed: Secondary | ICD-10-CM | POA: Diagnosis not present

## 2024-07-05 DIAGNOSIS — L97822 Non-pressure chronic ulcer of other part of left lower leg with fat layer exposed: Secondary | ICD-10-CM | POA: Diagnosis not present

## 2024-07-05 DIAGNOSIS — E1122 Type 2 diabetes mellitus with diabetic chronic kidney disease: Secondary | ICD-10-CM | POA: Diagnosis not present

## 2024-07-05 DIAGNOSIS — I87333 Chronic venous hypertension (idiopathic) with ulcer and inflammation of bilateral lower extremity: Secondary | ICD-10-CM | POA: Insufficient documentation

## 2024-07-05 DIAGNOSIS — N186 End stage renal disease: Secondary | ICD-10-CM | POA: Diagnosis not present

## 2024-07-05 DIAGNOSIS — L97522 Non-pressure chronic ulcer of other part of left foot with fat layer exposed: Secondary | ICD-10-CM | POA: Insufficient documentation

## 2024-07-05 DIAGNOSIS — I120 Hypertensive chronic kidney disease with stage 5 chronic kidney disease or end stage renal disease: Secondary | ICD-10-CM | POA: Insufficient documentation

## 2024-07-05 DIAGNOSIS — L97812 Non-pressure chronic ulcer of other part of right lower leg with fat layer exposed: Secondary | ICD-10-CM | POA: Insufficient documentation

## 2024-07-05 DIAGNOSIS — Z992 Dependence on renal dialysis: Secondary | ICD-10-CM | POA: Diagnosis not present

## 2024-07-05 DIAGNOSIS — L97512 Non-pressure chronic ulcer of other part of right foot with fat layer exposed: Secondary | ICD-10-CM | POA: Insufficient documentation

## 2024-07-05 DIAGNOSIS — E11622 Type 2 diabetes mellitus with other skin ulcer: Secondary | ICD-10-CM | POA: Diagnosis present

## 2024-07-06 ENCOUNTER — Ambulatory Visit: Payer: Self-pay | Admitting: Cardiovascular Disease

## 2024-07-10 ENCOUNTER — Encounter: Payer: Self-pay | Admitting: Nurse Practitioner

## 2024-07-10 ENCOUNTER — Ambulatory Visit: Attending: Nurse Practitioner | Admitting: Nurse Practitioner

## 2024-07-10 VITALS — BP 104/67 | HR 95 | Temp 97.0°F | Resp 18 | Ht 67.0 in | Wt 176.4 lb

## 2024-07-10 DIAGNOSIS — Z0289 Encounter for other administrative examinations: Secondary | ICD-10-CM | POA: Insufficient documentation

## 2024-07-10 DIAGNOSIS — Z79899 Other long term (current) drug therapy: Secondary | ICD-10-CM | POA: Diagnosis present

## 2024-07-10 DIAGNOSIS — M130 Polyarthritis, unspecified: Secondary | ICD-10-CM | POA: Insufficient documentation

## 2024-07-10 DIAGNOSIS — G894 Chronic pain syndrome: Secondary | ICD-10-CM | POA: Diagnosis present

## 2024-07-10 DIAGNOSIS — Z7189 Other specified counseling: Secondary | ICD-10-CM | POA: Insufficient documentation

## 2024-07-10 DIAGNOSIS — M79604 Pain in right leg: Secondary | ICD-10-CM | POA: Diagnosis present

## 2024-07-10 DIAGNOSIS — M79605 Pain in left leg: Secondary | ICD-10-CM | POA: Diagnosis present

## 2024-07-10 DIAGNOSIS — E1142 Type 2 diabetes mellitus with diabetic polyneuropathy: Secondary | ICD-10-CM | POA: Diagnosis not present

## 2024-07-10 DIAGNOSIS — Z794 Long term (current) use of insulin: Secondary | ICD-10-CM

## 2024-07-10 MED ORDER — OXYCODONE HCL 5 MG PO TABS: 5.0000 mg | ORAL_TABLET | Freq: Two times a day (BID) | ORAL | 0 refills | Status: DC | PRN

## 2024-07-10 MED ORDER — NALOXONE HCL 4 MG/0.1ML NA LIQD: 1.0000 | NASAL | 1 refills | Status: AC | PRN

## 2024-07-10 NOTE — Progress Notes (Signed)
 PROVIDER NOTE: Interpretation of information contained herein should be left to medically-trained personnel. Specific patient instructions are provided elsewhere under Patient Instructions section of medical record. This document was created in part using AI and STT-dictation technology, any transcriptional errors that may result from this process are unintentional.  Patient: [name]  Service: E/M   PCP: Patient, No Pcp Per  DOB: 10/17/1965  DOS: 07/10/2024  Provider: Emmy MARLA Blanch, NP  MRN: [id number]  Delivery: Face-to-face  Specialty: Interventional Pain Management  Type: Established Patient  Setting: Ambulatory outpatient facility  Specialty designation: 09  Referring Prov.: No ref. provider found  Location: Outpatient office facility       History of present illness (HPI) [name], a 58 y.o. year old female, is here today because of her Pain in both lower extremities [M79.604, M79.605]. [name] primary complain today is Leg Pain (Bilateral, lower)  Pain Assessment: Severity of Chronic pain is reported as a 10-Worst pain ever/10. Location: Leg Right, Left, Lower/ . Onset: More than a month ago. Quality: Sharp. Timing: Constant. Modifying factor(s): Tylenol . Vitals:  height is 5' 7 (1.702 m) and weight is 176 lb 5.9 oz (80 kg). Her temporal temperature is 97 F (36.1 C) (abnormal). Her blood pressure is 104/67 and her pulse is 95. Her respiration is 18 and oxygen saturation is 100%.  BMI: Estimated body mass index is 27.62 kg/m as calculated from the following:   Height as of this encounter: 5' 7 (1.702 m).   Weight as of this encounter: 176 lb 5.9 oz (80 kg).  Last encounter: Visit date not found. Last procedure: Visit date not found.  Reason for encounter: medication management. No change in medical history since last visit.  Patient's pain is at baseline.  Patient continues multimodal pain regimen as prescribed.  States that it provides pain relief and improvement in  functional status.   Discussed the use of AI scribe software for clinical note transcription with the patient, who gave verbal consent to proceed.  History of Present Illness   [name] is a 58 year old female who presents for pain management.  She experiences sharp, severe pain, particularly after taking sodium oxalate, describing it as 'like salt on the wound' inside her body. This pain necessitates the use of oxycodone  and Tylenol  for relief. I strictly advised to limit with NSAIDs. We discussed to start with oxycodone  5 mg BID for 30 days with qty of 60.   She is currently taking oxycodone  5 mg twice daily for pain management, but finds this regimen insufficient. She also takes Tylenol  more frequently than recommended due to the severity of her pain, sometimes needing to take it every three hours instead of every four.  She uses lidocaine  gel for topical wound care, as she has open wounds that are treated at a wound clinic. The clinic provides topical care but does not prescribe medications.  She experiences constipation as a side effect of oxycodone  use.     Pharmacotherapy Assessment   Opioid Analgesic: Oxycodone  Hcl (Ir) 5 Mg Tablet  MME/day: 32  Monitoring: [place] PMP: PDMP reviewed during this encounter.       Pharmacotherapy: No side-effects or adverse reactions reported. Compliance: No problems identified. Effectiveness: Clinically acceptable.  No notes on file  UDS:  No results found for: SUMMARY  No results found for: CBDTHCR No results found for: D8THCCBX No results found for: D9THCCBX  ROS  Constitutional: Denies any fever or chills Gastrointestinal: No reported hemesis, hematochezia,  vomiting, or acute GI distress Musculoskeletal: Bilateral lower extremity pain Neurological: No reported episodes of acute onset apraxia, aphasia, dysarthria, agnosia, amnesia, paralysis, loss of coordination, or loss of consciousness  Medication Review  B Complex-C-Folic  Acid, Dupilumab, Fluticasone-Umeclidin-Vilant, Sodium Thiosulfate, Vitamin D  (Ergocalciferol ), albuterol , insulin  aspart, ipratropium-albuterol , midodrine, oxyCODONE , pentoxifylline , and sevelamer  carbonate  History Review  Allergy: [name] is allergic to enalapril, ivp dye [iodinated contrast media], lisinopril, and prednisone . Drug: [name]  reports no history of drug use. Alcohol:  reports no history of alcohol use. Tobacco:  reports that she has never smoked. She has never used smokeless tobacco. Social: [name]  reports that she has never smoked. She has never used smokeless tobacco. She reports that she does not drink alcohol and does not use drugs. Medical:  has a past medical history of Anemia, Anxiety, Calciphylaxis, Chronic kidney disease, Diabetes mellitus without complication (HCC), GERD (gastroesophageal reflux disease), High serum parathyroid hormone (PTH), History of kidney stones (2000), Hypertension, Neuromuscular disorder (HCC), PONV (postoperative nausea and vomiting), and Stroke (HCC) (10/2017). Surgical: [name]  has a past surgical history that includes Shoulder Closed Reduction (Right, 2004); Rotator cuff repair (Right, 2004); Abdominal hysterectomy (2007); AV Fistula Insertion w/RF Magnetic Guidance (N/A, 12/08/2017); UPPER EXTREMITY VENOGRAPHY (Left, 02/15/2018); DIALYSIS/PERMA CATHETER INSERTION (Right, 10/03/2021); Hip Arthroplasty (Left, 09/29/2021); Quadriceps tendon repair (Right, 10/16/2021); Incision and drainage hip (Left, 11/15/2021); Application if wound vac (Left, 11/15/2021); Hip Arthroplasty (Right, 12/14/2021); Application if wound vac (Right, 12/14/2021); and PACEMAKER LEADLESS INSERTION (N/A, 03/09/2024). Family: family history includes Alcohol abuse in her brother; COPD in her mother; Cancer in her father and paternal aunt; Cardiomyopathy in her brother; Diabetes in her brother and sister; Eczema in her sister; Emphysema in her mother; Hypertension in her brother and  sister.  Laboratory Chemistry Profile   Renal Lab Results  Component Value Date   BUN 49 (H) 07/02/2024   CREATININE 7.40 (H) 07/02/2024   GFRAA 8 (L) 12/06/2017   GFRNONAA 6 (L) 07/02/2024    Hepatic Lab Results  Component Value Date   AST 14 (L) 07/02/2024   ALT 11 07/02/2024   ALBUMIN 3.7 07/02/2024   ALKPHOS 182 (H) 07/02/2024   HCVAB NON REACTIVE 05/03/2024    Electrolytes Lab Results  Component Value Date   NA 139 07/02/2024   K 4.6 07/02/2024   CL 88 (L) 07/02/2024   CALCIUM  11.1 (H) 07/02/2024   MG 2.0 05/01/2024   PHOS 3.3 05/01/2024    Bone Lab Results  Component Value Date   VD25OH 23.67 (L) 09/29/2021    Inflammation (CRP: Acute Phase) (ESR: Chronic Phase) Lab Results  Component Value Date   CRP 1.7 (H) 05/01/2024   ESRSEDRATE 3 05/01/2024   LATICACIDVEN 2.3 (HH) 07/02/2024         Note: Above Lab results reviewed.  Recent Imaging Review  VAS US  GROIN PSEUDOANEURYSM  ARTERIAL PSEUDOANEURYSM    Patient Name:  [name]  Date of Exam:   06/21/2024 Medical Rec #: [id number]     Accession #:    [id number] Date of Birth: Nov 13, 1965     Patient Gender: F Patient Age:   58 years Exam Location:  [address] Procedure:      VAS US  QUILLIAN CAPES Referring Phys: CHANTAL RIDDLE  --------------------------------------------------------------------------------   Exam: RLQ/Right suprapubic pain, swelling and redness. Indications: Patient complains of palpable knot and bruising. History: S/p pacemaker leadless insertion on 03/09/2024. Comparison Study: NA  Performing Technologist: Nanetta Shad RVT    Examination  Guidelines: A complete evaluation includes B-mode imaging, spectral Doppler, color Doppler, and power Doppler as needed of all accessible portions of each vessel. Bilateral testing is considered an integral part of a complete examination. Limited examinations for reoccurring indications may be performed as  noted.  +------------+----------+-----------+------+----------+ Right DuplexPSV (cm/s) Waveform  PlaqueComment(s) +------------+----------+-----------+------+----------+ Ext.Iliac       82    multiphasic                 +------------+----------+-----------+------+----------+ CFA            116    multiphasic                 +------------+----------+-----------+------+----------+ PFA             76     triphasic                  +------------+----------+-----------+------+----------+ Prox SFA       102    multiphasic                 +------------+----------+-----------+------+----------+  Right Vein comments: Patent external iliac vein, deep femoral vein and proximal vein with spontaneous pulsatile venous flow.   Summary: Probable right suprapubic enlarged lymph node at area of concern, measuring 2.8 cm.   Pulsatile venous flow is noted; pulsatile lower limb venous Doppler waveforms correlates well with increase right atrium pressure; right heart failure.  Diagnosing physician: Dorn Ross MD Electronically signed by Dorn Ross MD on 06/23/2024 at 8:26:36 AM.         --------------------------------------------------------------------------------      Final   Note: Reviewed        Physical Exam  Vitals: BP 104/67 (Cuff Size: Normal)   Pulse 95   Temp (!) 97 F (36.1 C) (Temporal)   Resp 18   Ht 5' 7 (1.702 m)   Wt 176 lb 5.9 oz (80 kg)   SpO2 100%   BMI 27.62 kg/m  BMI: Estimated body mass index is 27.62 kg/m as calculated from the following:   Height as of this encounter: 5' 7 (1.702 m).   Weight as of this encounter: 176 lb 5.9 oz (80 kg). Ideal: Ideal body weight: 61.6 kg (135 lb 12.9 oz) Adjusted ideal body weight: 69 kg (152 lb 0.5 oz) General appearance: Well nourished, well developed, and well hydrated. In no apparent acute distress Mental status: Alert, oriented x 3 (person, place, & time)       Respiratory: No  evidence of acute respiratory distress Eyes: PERLA  Musculoskeletal: Bilateral leg pain Assessment   Diagnosis Status  1. Pain in both lower extremities   2. Polyarthritis   3. Diabetic peripheral neuropathy (HCC)   4. Chronic pain syndrome   5. Medication management   6. Pain management contract discussed   7. Pain management contract signed    Controlled Controlled Controlled   Updated Problems: No problems updated.  Plan of Care  Problem-specific:  Assessment and Plan    Chronic pain syndrome with right leg pain Chronic pain syndrome with severe right leg pain, exacerbated by sodium oxalate. Current regimen includes oxycodone  5 mg twice daily and Tylenol  every four hours. Pain is severe enough to require early administration of Tylenol . Lidocaine  gel is used for topical pain relief. Wound care is managed by a wound clinic with topical treatments. Pain contract discussed, emphasizing exclusive medication source and avoidance of illegal substances. - Prescribed oxycodone  5 mg immediate release twice daily for one month. - Continue  lidocaine  gel for topical pain relief. - Continue wound care at the wound clinic. - Sent prescription to pharmacy at [facility]. Started on oxycodone  5 mg immediate release 2 times daily as needed for pain. Prescribing drug monitoring (PMP) reviewed.  Urine drug screening (UDS) up to date and consistent with the use of prescribed medication.  Pain contract reviewed discussed and completed.  Schedule follow-up in 30 days for medication management.   Opioid-induced constipation Constipation as a side effect of oxycodone  use. Limited fluid intake due to renal concerns, restricted to 24 ounces of water  daily. - Increase water  intake as tolerated. - Use Miralax  or Colace for constipation management.       [name] has a current medication list which includes the following long-term medication(s): albuterol  and  ipratropium-albuterol .  Pharmacotherapy (Medications Ordered): Meds ordered this encounter  Medications   oxyCODONE  (OXY IR/ROXICODONE ) 5 MG immediate release tablet    Sig: Take 1 tablet (5 mg total) by mouth every 12 (twelve) hours as needed for severe pain (pain score 7-10). Must last 30 days.    Dispense:  60 tablet    Refill:  0    Chronic Pain: STOP Act (Not applicable) Fill 1 day early if closed on refill date. Avoid benzodiazepines within 8 hours of opioids   Orders:  No orders of the defined types were placed in this encounter.       Return in about 1 month (around 08/09/2024) for (F2F), (MM), Emmy Blanch NP.    Recent Visits Date Type Provider Dept  07/03/24 Office Visit Marcelino Nurse, MD [facility]  Showing recent visits within past 90 days and meeting all other requirements Today's Visits Date Type Provider Dept  07/10/24 Office Visit Namir Neto K, NP [facility]  Showing today's visits and meeting all other requirements Future Appointments No visits were found meeting these conditions. Showing future appointments within next 90 days and meeting all other requirements  I discussed the assessment and treatment plan with the patient. The patient was provided an opportunity to ask questions and all were answered. The patient agreed with the plan and demonstrated an understanding of the instructions.  Patient advised to call back or seek an in-person evaluation if the symptoms or condition worsens.  I personally spent a total of 30 minutes in the care of the patient today including preparing to see the patient, getting/reviewing separately obtained history, performing a medically appropriate exam/evaluation, counseling and educating, placing orders, referring and communicating with other health care professionals, documenting clinical information in the EHR, independently interpreting results, communicating results, and coordinating care.   Note by:  Calysta Craigo K Julie-Ann Vanmaanen, NP (TTS and AI technology used. I apologize for any typographical errors that were not detected and corrected.) Date: 07/10/2024; Time: 3:09 PM

## 2024-07-12 ENCOUNTER — Encounter: Admitting: Physician Assistant

## 2024-07-12 DIAGNOSIS — E11622 Type 2 diabetes mellitus with other skin ulcer: Secondary | ICD-10-CM | POA: Diagnosis not present

## 2024-07-16 LAB — OXYCODONES,MS,WB/SP RFX
Oxycocone: NEGATIVE ng/mL
Oxycodones Confirmation: NEGATIVE
Oxymorphone: NEGATIVE ng/mL

## 2024-07-16 LAB — DRUG SCREEN 10 W/CONF, SERUM
Amphetamines, IA: NEGATIVE ng/mL
Barbiturates, IA: NEGATIVE ug/mL
Benzodiazepines, IA: NEGATIVE ng/mL
Cocaine & Metabolite, IA: NEGATIVE ng/mL
Methadone, IA: NEGATIVE ng/mL
Opiates, IA: NEGATIVE ng/mL
Oxycodones, IA: NEGATIVE ng/mL
Phencyclidine, IA: NEGATIVE ng/mL
Propoxyphene, IA: NEGATIVE ng/mL
THC(Marijuana) Metabolite, IA: NEGATIVE ng/mL

## 2024-07-19 ENCOUNTER — Encounter: Admitting: Physician Assistant

## 2024-07-19 DIAGNOSIS — E11622 Type 2 diabetes mellitus with other skin ulcer: Secondary | ICD-10-CM | POA: Diagnosis not present

## 2024-07-25 ENCOUNTER — Ambulatory Visit (INDEPENDENT_AMBULATORY_CARE_PROVIDER_SITE_OTHER)

## 2024-07-25 ENCOUNTER — Encounter

## 2024-07-25 VITALS — BP 85/51 | HR 92 | Ht 67.0 in | Wt 176.4 lb

## 2024-07-25 DIAGNOSIS — Z95811 Presence of heart assist device: Secondary | ICD-10-CM | POA: Insufficient documentation

## 2024-07-25 DIAGNOSIS — N186 End stage renal disease: Secondary | ICD-10-CM

## 2024-07-25 DIAGNOSIS — Z1211 Encounter for screening for malignant neoplasm of colon: Secondary | ICD-10-CM

## 2024-07-25 DIAGNOSIS — I442 Atrioventricular block, complete: Secondary | ICD-10-CM

## 2024-07-25 DIAGNOSIS — J4550 Severe persistent asthma, uncomplicated: Secondary | ICD-10-CM

## 2024-07-25 DIAGNOSIS — E1122 Type 2 diabetes mellitus with diabetic chronic kidney disease: Secondary | ICD-10-CM

## 2024-07-25 DIAGNOSIS — I272 Pulmonary hypertension, unspecified: Secondary | ICD-10-CM | POA: Insufficient documentation

## 2024-07-25 DIAGNOSIS — J45909 Unspecified asthma, uncomplicated: Secondary | ICD-10-CM | POA: Insufficient documentation

## 2024-07-25 MED ORDER — DEXCOM G7 SENSOR MISC
3 refills | Status: AC
Start: 2024-07-25 — End: ?

## 2024-07-25 NOTE — Progress Notes (Signed)
 New patient visit   Patient: [name]   DOB: [birth year]   [name]  MRN: [id number] Visit Date: 07/25/2024  Today's healthcare provider: Isaiah DELENA Pepper, MD   Chief Complaint  Patient presents with   New Patient (Initial Visit)   Subjective    [name] is a 58 y.o. female who presents today as a new patient to establish care.   Discussed the use of AI scribe software for clinical note transcription with the patient, who gave verbal consent to proceed.  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  She experiences severe pain in her legs due to calciphylaxis, which is being treated at a wound clinic. The pain is particularly intense during wound care sessions. She is on sodium thiosulfate 12.5 mg. For pain management, she takes two oxycodone  tablets nightly at 10 PM, but finds this insufficient, especially on wound care days. She supplements with two Tylenol  every four hours but is concerned about the high intake.  She has a history of diabetes and is currently on Novolog , taking 14 units before each meal. She previously used Lantus  but discontinued due to insurance issues. Her A1c is monitored every three months at the dialysis clinic, but she does not recall the most recent result. She has difficulty managing her blood sugar due to bruising from finger pricks and prefers using a continuous glucose monitor like the Dexcom G7.  She has asthma and is currently using Dupixent, which is working well. She is allergic to steroids and cannot use Trelegy. She uses albuterol  as needed, approximately once a week, and has not needed her nebulizer since starting Dupixent.  She experiences pain when attempting to walk with crutches and is awaiting the healing of her leg wounds to start physical therapy. She is not sleeping in her bed due to leg pain and sleeps in a  chair with her feet on the floor to avoid pressure on her sores.  She has a pacemaker due to heart issues and has had a hysterectomy. She denies being sexually active due to her current health issues. She has a history of hospital admissions, primarily for shortness of breath and leg issues, and reports a lack of resolution for her pain during these admissions.   Past Medical History:  Diagnosis Date   Allergy    Anemia    vitamin d3 deficiency   Anxiety    Asthma    Calciphylaxis    Chronic kidney disease    End Stage Renal Disease   Diabetes mellitus without complication (HCC)    GERD (gastroesophageal reflux disease)    nothing over last few years   Heart murmur    High serum parathyroid hormone (PTH)    checked through Dialysis   History of kidney stones 2000   Hypertension    Neuromuscular disorder (HCC)    neuropathy in feet   Osteoporosis    PONV (postoperative nausea and vomiting)    severe nausea requiring many doses of post op antiemetics   Past Surgical History:  Procedure Laterality Date   ABDOMINAL HYSTERECTOMY  2007   APPLICATION OF WOUND VAC Left 11/15/2021   Procedure: APPLICATION OF WOUND VAC;  Surgeon: Marchia Drivers, MD;  Location: [facility];  Service: Orthopedics;  Laterality: Left;  Prevena 13cm    APPLICATION OF WOUND VAC Right 12/14/2021   Procedure: APPLICATION OF WOUND VAC;  Surgeon: Tobie Priest, MD;  Location: [facility];  Service: Orthopedics;  Laterality: Right;  [id number]   AV FISTULA INSERTION W/ RF MAGNETIC GUIDANCE N/A 12/08/2017   Procedure: AV FISTULA INSERTION W/RF MAGNETIC GUIDANCE;  Surgeon: Jama Cordella MATSU, MD;  Location: [facility];  Service: Cardiovascular;  Laterality: N/A;   DIALYSIS/PERMA CATHETER INSERTION Right 10/03/2021   Procedure: DIALYSIS/PERMA CATHETER INSERTION;  Surgeon: Jama Cordella MATSU, MD;  Location: [facility];  Service: Cardiovascular;  Laterality: Right;   HIP ARTHROPLASTY Left 09/29/2021    Procedure: ARTHROPLASTY BIPOLAR HIP (HEMIARTHROPLASTY);  Surgeon: Leora Lynwood SAUNDERS, MD;  Location: [facility];  Service: Orthopedics;  Laterality: Left;   HIP ARTHROPLASTY Right 12/14/2021   Procedure: ARTHROPLASTY BIPOLAR HIP (HEMIARTHROPLASTY);  Surgeon: Tobie Priest, MD;  Location: [facility];  Service: Orthopedics;  Laterality: Right;   INCISION AND DRAINAGE HIP Left 11/15/2021   Procedure: IRRIGATION AND DEBRIDEMENT LEFT HIP WOUND;  Surgeon: Marchia Drivers, MD;  Location: [facility];  Service: Orthopedics;  Laterality: Left;   PACEMAKER LEADLESS INSERTION N/A 03/09/2024   Procedure: PACEMAKER LEADLESS INSERTION;  Surgeon: Nancey Eulas BRAVO, MD;  Location: [facility];  Service: Cardiovascular;  Laterality: N/A;   QUADRICEPS TENDON REPAIR Right 10/16/2021   Procedure: REPAIR QUADRICEP TENDON;  Surgeon: Doll Skates, MD;  Location: [facility];  Service: Orthopedics;  Laterality: Right;   ROTATOR CUFF REPAIR Right 2004   SHOULDER CLOSED REDUCTION Right 2004   UPPER EXTREMITY VENOGRAPHY Left 02/15/2018   Procedure: UPPER EXTREMITY VENOGRAPHY;  Surgeon: Jama Cordella MATSU, MD;  Location: [facility];  Service: Cardiovascular;  Laterality: Left;   Family Status  Relation Name Status   Mother  Deceased   Father  Deceased   Bruna Nyhan  (Not Specified)   Sister  Alive   Brother  Alive   Brother  Deceased at age 37  No partnership data on file   Family History  Problem Relation Age of Onset   Emphysema Mother    COPD Mother    Cancer Father    Cancer Paternal Aunt    Diabetes Sister    Hypertension Sister    Eczema Sister    Diabetes Brother    Hypertension Brother    Cardiomyopathy Brother    Alcohol abuse Brother    Social History   Socioeconomic History   Marital status: Married    Spouse name: Redell   Number of children: 0   Years of education: Not on file   Highest education level: Some college, no degree  Occupational History   Occupation: Scientist, [employer]   Tobacco Use   Smoking status: Never   Smokeless tobacco: Never  Vaping Use   Vaping status: Never Used  Substance and Sexual Activity   Alcohol use: Never   Drug use: Never   Sexual activity: Not Currently    Partners: Male    Birth control/protection: None  Other Topics Concern   Not on file  Social History Narrative   Not on file   Social Drivers of Health   Financial Resource Strain: Low Risk  (07/25/2024)   Overall Financial Resource Strain (CARDIA)    Difficulty of Paying Living Expenses: Not hard at all  Food Insecurity: No Food Insecurity (07/25/2024)   Hunger Vital Sign    Worried About Running Out of Food in the Last Year: Never true    Ran Out of Food in the Last Year: Never true  Transportation Needs: No Transportation Needs (07/25/2024)  PRAPARE - Administrator, Civil Service (Medical): No    Lack of Transportation (Non-Medical): No  Physical Activity: Inactive (07/25/2024)   Exercise Vital Sign    Days of Exercise per Week: 0 days    Minutes of Exercise per Session: Not on file  Stress: No Stress Concern Present (03/07/2018)   [employer] of Occupational Health - Occupational Stress Questionnaire    Feeling of Stress : Not at all  Social Connections: Socially Integrated (07/25/2024)   Social Connection and Isolation Panel    Frequency of Communication with Friends and Family: More than three times a week    Frequency of Social Gatherings with Friends and Family: Three times a week    Attends Religious Services: More than 4 times per year    Active Member of Clubs or Organizations: Yes    Attends Banker Meetings: More than 4 times per year    Marital Status: Married  Recent Concern: Social Connections - Moderately Isolated (05/01/2024)   Social Connection and Isolation Panel    Frequency of Communication with Friends and Family: More than three times a week    Frequency of Social Gatherings with Friends and Family: Twice a week     Attends Religious Services: Never    Database Administrator or Organizations: No    Attends Engineer, Structural: Never    Marital Status: Married   Outpatient Medications Prior to Visit  Medication Sig   albuterol  (VENTOLIN  HFA) 108 (90 Base) MCG/ACT inhaler Inhale 2 puffs into the lungs every 6 (six) hours as needed for wheezing or shortness of breath.   B Complex-C-Folic Acid  (RENA-VITE PO) Take 1 tablet by mouth daily.   Dupilumab 300 MG/2ML SOAJ Inject 300 mg into the skin.   ipratropium-albuterol  (DUONEB) 0.5-2.5 (3) MG/3ML SOLN Take 3 mLs by nebulization 4 (four) times daily.   naloxone  (NARCAN ) nasal spray 4 mg/0.1 mL Place 1 spray into the nose as needed for up to 365 doses (for opioid-induced respiratory depresssion). In case of emergency (overdose), spray once into each nostril. If no response within 3 minutes, repeat application and call 911.   NOVOLOG  FLEXPEN 100 UNIT/ML FlexPen Inject 14 Units into the skin 3 (three) times daily with meals.   oxyCODONE  (OXY IR/ROXICODONE ) 5 MG immediate release tablet Take 1 tablet (5 mg total) by mouth every 6 (six) hours as needed for severe pain (pain score 7-10).   oxyCODONE  (OXY IR/ROXICODONE ) 5 MG immediate release tablet Take 1 tablet (5 mg total) by mouth every 12 (twelve) hours as needed for severe pain (pain score 7-10). Must last 30 days.   pentoxifylline  (TRENTAL ) 400 MG CR tablet Take 1 tablet (400 mg total) by mouth daily.   sevelamer  carbonate (RENVELA ) 800 MG tablet Take 2,400 mg by mouth 3 (three) times daily with meals.   SODIUM THIOSULFATE IV Inject 12.5 mg into the vein 3 (three) times a week.   [DISCONTINUED] Vitamin D , Ergocalciferol , (DRISDOL ) 1.25 MG (50000 UNIT) CAPS capsule Take 1 capsule (50,000 Units total) by mouth every 7 (seven) days.   [DISCONTINUED] TRELEGY ELLIPTA 200-62.5-25 MCG/ACT AEPB Inhale 1 puff into the lungs daily.   No facility-administered medications prior to visit.   Allergies  Allergen  Reactions   Enalapril Hives and Other (See Comments)    Angioedema face.   Ivp Dye [Iodinated Contrast Media] Hives   Lisinopril Shortness Of Breath   Prednisone  Shortness Of Breath    Reviews of Systems as  noted in HPI.      Objective    BP (!) 85/51 (BP Location: Right Arm, Patient Position: Sitting, Cuff Size: Normal)   Pulse 92   Ht 5' 7 (1.702 m)   Wt 176 lb 5.9 oz (80 kg)   SpO2 100%   BMI 27.62 kg/m     Physical Exam Constitutional:      Appearance: Normal appearance.  HENT:     Head: Normocephalic and atraumatic.     Mouth/Throat:     Mouth: Mucous membranes are moist.  Eyes:     Pupils: Pupils are equal, round, and reactive to light.  Cardiovascular:     Rate and Rhythm: Normal rate and regular rhythm.  Pulmonary:     Effort: Pulmonary effort is normal.     Breath sounds: Normal breath sounds.  Musculoskeletal:     Comments: Legs wrapped in ACE bandages.  Skin:    General: Skin is warm.  Neurological:     General: No focal deficit present.     Mental Status: She is alert.     Depression Screen    07/25/2024    2:13 PM 07/10/2024    2:51 PM 07/03/2024    2:02 PM 04/04/2019    1:51 PM  PHQ 2/9 Scores  PHQ - 2 Score 0 0 0 0  PHQ- 9 Score   6  0      Data saved with a previous flowsheet row definition   No results found for any visits on 07/25/24.  Assessment & Plan      Problem List Items Addressed This Visit       Cardiovascular and Mediastinum   Complete heart block (HCC)   Calciphylaxis     Respiratory   Asthma     Endocrine   Stage 5 chronic renal impairment associated with type 2 diabetes mellitus (HCC)   Type 2 diabetes mellitus with end-stage renal disease (HCC) - Primary   Relevant Medications   Continuous Glucose Sensor (DEXCOM G7 SENSOR) MISC   Other Relevant Orders   Hemoglobin A1c   Lipid panel     Genitourinary   ESRD on dialysis (HCC)     Other   Presence of heart assist device [facility])   Other Visit Diagnoses        Screening for colon cancer       Relevant Orders   Cologuard      Assessment & Plan 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 Follows with wound clinic. - Continue sodium thiosulfate - Continue oxycodone  as prescribed for pain. Recommend patient follow up with pain management. - Advised limiting Tylenol  to 4 grams per day. - Follow-up with wound clinic.  End stage renal disease on dialysis [facility]) Managed with dialysis MWF. Follows with nephrology. Blood pressure typically runs low. - Continue regular dialysis. - Continue Renvela   Type 2 diabetes mellitus, insulin  dependent Managed with Novolog  TID. A1c previously high, current status unknown. Potential need for long-acting insulin . - Checked A1c today. - Will consult with pharmacist for insulin  regimen. - Discussed Dexcom G7 for glucose monitoring, given free samples today and filled Rx. - Follow up in 1 month  Severe persistent asthma (HCC) Chronic, well-controlled with Dupixent. - Continue Dupixent. - Use albuterol  as needed. - Follow up with pulmonology  Complete Heart Block (HCC) Presence of Heart Assist Device [facility]) Chronic, controlled. Pacemaker functioning well. - Continue routine follow-up with cardiologist.  General Health Maintenance Declines  mammogram and colonoscopy, open to stool sample testing. - Ordered Cologuard stool sample. - Recommended Medicare annual wellness visit.    Return in about 4 weeks (around 08/22/2024) for Follow Up.      Isaiah DELENA Pepper, MD  [facility] [phone] (phone) [phone] (fax)

## 2024-07-26 ENCOUNTER — Ambulatory Visit: Payer: Self-pay

## 2024-07-26 ENCOUNTER — Encounter: Admitting: Physician Assistant

## 2024-07-26 DIAGNOSIS — E11622 Type 2 diabetes mellitus with other skin ulcer: Secondary | ICD-10-CM | POA: Diagnosis not present

## 2024-07-26 LAB — LIPID PANEL
Chol/HDL Ratio: 1.8 ratio (ref 0.0–4.4)
Cholesterol, Total: 119 mg/dL (ref 100–199)
HDL: 67 mg/dL (ref >39)
LDL Chol Calc (NIH): 33 mg/dL (ref 0–99)
Triglycerides: 103 mg/dL (ref 0–149)
VLDL Cholesterol Cal: 19 mg/dL (ref 5–40)

## 2024-07-26 LAB — HEMOGLOBIN A1C
Est. average glucose Bld gHb Est-mCnc: 140 mg/dL
Hgb A1c MFr Bld: 6.5 % — ABNORMAL HIGH (ref 4.8–5.6)

## 2024-07-26 MED ORDER — DEXCOM G7 SENSOR MISC: Status: DC

## 2024-07-26 NOTE — Addendum Note (Signed)
 Addended by: LILIAN SEVERO RAMAN on: 07/26/2024 03:54 PM   Modules accepted: Orders

## 2024-08-01 ENCOUNTER — Ambulatory Visit

## 2024-08-01 DIAGNOSIS — I442 Atrioventricular block, complete: Secondary | ICD-10-CM

## 2024-08-02 ENCOUNTER — Encounter: Admitting: Physician Assistant

## 2024-08-02 DIAGNOSIS — L97522 Non-pressure chronic ulcer of other part of left foot with fat layer exposed: Secondary | ICD-10-CM | POA: Insufficient documentation

## 2024-08-02 DIAGNOSIS — I120 Hypertensive chronic kidney disease with stage 5 chronic kidney disease or end stage renal disease: Secondary | ICD-10-CM | POA: Diagnosis not present

## 2024-08-02 DIAGNOSIS — N186 End stage renal disease: Secondary | ICD-10-CM | POA: Insufficient documentation

## 2024-08-02 DIAGNOSIS — L97822 Non-pressure chronic ulcer of other part of left lower leg with fat layer exposed: Secondary | ICD-10-CM | POA: Insufficient documentation

## 2024-08-02 DIAGNOSIS — Z992 Dependence on renal dialysis: Secondary | ICD-10-CM | POA: Diagnosis not present

## 2024-08-02 DIAGNOSIS — L97812 Non-pressure chronic ulcer of other part of right lower leg with fat layer exposed: Secondary | ICD-10-CM | POA: Diagnosis not present

## 2024-08-02 DIAGNOSIS — E11622 Type 2 diabetes mellitus with other skin ulcer: Secondary | ICD-10-CM | POA: Diagnosis present

## 2024-08-02 DIAGNOSIS — L97512 Non-pressure chronic ulcer of other part of right foot with fat layer exposed: Secondary | ICD-10-CM | POA: Diagnosis not present

## 2024-08-02 DIAGNOSIS — I87333 Chronic venous hypertension (idiopathic) with ulcer and inflammation of bilateral lower extremity: Secondary | ICD-10-CM | POA: Diagnosis not present

## 2024-08-02 DIAGNOSIS — E1122 Type 2 diabetes mellitus with diabetic chronic kidney disease: Secondary | ICD-10-CM | POA: Diagnosis not present

## 2024-08-02 DIAGNOSIS — L97412 Non-pressure chronic ulcer of right heel and midfoot with fat layer exposed: Secondary | ICD-10-CM | POA: Insufficient documentation

## 2024-08-02 LAB — CUP PACEART REMOTE DEVICE CHECK
Battery Remaining Longevity: 57 mo
Battery Voltage: 3.05 V
Brady Statistic AS VP Percent: 98.18 %
Brady Statistic AS VS Percent: 1.82 %
Brady Statistic RV Percent Paced: 18.11 %
Date Time Interrogation Session: 20251202135231
Implantable Pulse Generator Implant Date: 20250710
Lead Channel Impedance Value: 440 Ohm
Lead Channel Pacing Threshold Amplitude: 2 V
Lead Channel Pacing Threshold Pulse Width: 0.24 ms
Lead Channel Sensing Intrinsic Amplitude: 1.5
Lead Channel Setting Pacing Amplitude: 2.63 V
Lead Channel Setting Pacing Pulse Width: 0.24 ms
Lead Channel Setting Sensing Sensitivity: 2 mV

## 2024-08-03 ENCOUNTER — Telehealth: Payer: Self-pay | Admitting: *Deleted

## 2024-08-03 ENCOUNTER — Telehealth: Payer: Self-pay

## 2024-08-03 ENCOUNTER — Ambulatory Visit: Payer: Self-pay | Admitting: Cardiovascular Disease

## 2024-08-03 ENCOUNTER — Other Ambulatory Visit: Payer: Self-pay | Admitting: Dermatology

## 2024-08-03 NOTE — Telephone Encounter (Signed)
 She states we did not give her enough oxycodone  to last until her appt. 08/10/24. She wants enough called out to last her till then. [name] has no openings before Thursday.

## 2024-08-03 NOTE — Telephone Encounter (Signed)
 This medication will need to be filled by her pain clinic since she has a pain management contract with them. Please advise her to discuss with her pain clinic.

## 2024-08-03 NOTE — Telephone Encounter (Signed)
 Noted

## 2024-08-03 NOTE — Telephone Encounter (Signed)
 Returned patient phone call, voicemail left to please call so we can discuss what she is needing.

## 2024-08-03 NOTE — Telephone Encounter (Signed)
 Patient called back asking about pain meds.  Patient is being seen at Wound care for a problem with her legs and these visits require pain medication during the procedure and then that evening.  Dr Dicky was prescribing for her for a 4 day supply on 07/02/24 and then on 07/10/24 Seema prescribed a 30 days supply of qty 60 oxycodone  5 mg to be taken bid.  Patient is going to run out of medication on Saturday because she used the chronic pain medication for her wound care days.  I have checked with pharmacy and everything checks out with them and by their calculations she will be out of medication on Saturday.  Patient is very upset and feels that she is being treated like she is addicted to pain meds when in fact she really does not want to take them but she has to because of severe pain in the legs.    Also, I did question pharmacy about story that patient told me that she waited 3 days from when Rx was written waiting for PA and then insurance did an over ride and they filled medicine, I was informed that patient paid out of pocket for medications.    Told patient that I would need to investigate a little further and call her back.

## 2024-08-04 NOTE — Progress Notes (Signed)
 Remote PPM Transmission

## 2024-08-07 ENCOUNTER — Telehealth: Payer: Self-pay | Admitting: Nurse Practitioner

## 2024-08-07 ENCOUNTER — Ambulatory Visit: Admitting: Dermatology

## 2024-08-07 ENCOUNTER — Ambulatory Visit: Payer: Self-pay

## 2024-08-07 NOTE — Telephone Encounter (Signed)
 FYI Only or Action Required?: Action required by provider: update on patient condition.  Patient was last seen in primary care on 07/25/2024 by Franchot Isaiah LABOR, MD.  Called Nurse Triage reporting No chief complaint on file..  Symptoms began several weeks ago.  Interventions attempted: Prescription medications: oxycodone .  Symptoms are: gradually worsening.  Triage Disposition: Information or Advice Only Call  Patient/caregiver understands and will follow disposition?: Yes  Copied from CRM [id number]. Topic: Clinical - Red Word Triage >> Aug 07, 2024  2:02 PM Amy B wrote: Red Word that prompted transfer to Nurse Triage: pain in legs and stomach   Patient's husband called to report patient is out of her pain medications until Thursday. Reports pain to abdomen and legs. Call placed to patient to get more information. No answer-voicemail left to call back to Nurse Triage. Will place in call backs   Reason for Disposition  Health information question, no triage required and triager able to answer question  Answer Assessment - Initial Assessment Questions 1. REASON FOR CALL: What is the main reason for your call? or How can I best help you?     Calling to report that she is out of pain meds. Pain management clinic has not sent in the right amount of pills to last her. She takes 10mg  (two 5mg  tabs) oxycodone  nightly for some relief, tylenol  q4hrs. On weds she has extensive wound care and takes 5mg  prior to and 5mg  after appt.   Asking if Dr Franchot will take over prescribing for patient. She does not want to be on pain meds. She wants to have these calciphylaxis wounds healed as quick as possible but was told it can take 6-85months of excruciating pain. She feels judged by the RN's who seem jaded to requests and conversations about pain. It feels like Elvan Grieves is clawing through her leg where they are treating the wound. Crying on the call.   Also wanted to let provider know the area on  her stomach was assessed by wound care MD and determined calciphylaxis as well.   Husband- concerned she is barely sleeping. Concerned her stomach will get infected.   Appt set for Thursday at 4pm. IF Dr Franchot ok with taking over meds- she wants to keep the appt. If she is not, she has another Pain management appt at 3 the same day and wants to cancel it. Please let patient know.  Protocols used: Information Only Call - No Triage-A-AH

## 2024-08-07 NOTE — Telephone Encounter (Signed)
 Copied from CRM [id number]. Topic: Clinical - Red Word Triage >> Aug 07, 2024  2:02 PM Amy B wrote: Red Word that prompted transfer to Nurse Triage: pain in legs and stomach  Patient's husband called to report patient is out of her pain medications until Thursday. Reports pain to abdomen and legs. Call placed to patient to get more information. No answer-voicemail left to call back to Nurse Triage. Will place in call backs

## 2024-08-07 NOTE — Telephone Encounter (Signed)
 This message originated for me on Thursday and I was making an effort to request additional pain medications that would allow her to take for wound care as well as her chronic pain medicine.    Checked this morning and nothing was resolved with any additional pain meds.  Contacted Seema and she is not willing to send any additional medications.    Called to patient to let her know and she states that someone needs to address her pain as no one has.   States she has an appt on Thursday, should she just cancel,  I told her that she should not cancel because we are prescribing for her and then she would not have that medication for her pain.  Patient stated she would be here on Thursday and hung up the phone.

## 2024-08-07 NOTE — Telephone Encounter (Signed)
 [name] is supposed to call patient about her medication. Lori please contact patient about medication ?

## 2024-08-08 ENCOUNTER — Telehealth: Payer: Self-pay

## 2024-08-08 NOTE — Telephone Encounter (Unsigned)
 Copied from CRM #[id number]. Topic: Clinical - Medication Question >> Aug 07, 2024  5:18 PM Lauren C wrote: Reason for CRM: Husband Redell on dpr calling regarding pts recent request from last week for enough of her pain medication to get her until 12/11 for her pain management appointment.   Her original messsage reads: Dr. Franchot, I am out of oxycodone  as of Saturday. I will need 14 more 5mg  pills to get me through to the pain clinics next appointment on December 11th. They did not give me enough for my wound clinic appointments every wednesday which I take 1 at 10a.m. and one at 4pm plus 2 at night. It's we discussed. I have contacted the pain clinic and they are not going to be able to refill until I come in  I will be in severe pain as u saw pics of my legs. Can you please take over my pain management and order me my oxycodone  before Saturday? We even talked about 10mg  to break one up during the day and then take the one 10mg  at night.  Thank you so much for your help.  He says that they are not willing to work with her on providing any medication until the 11th. She is wondering if Dr. Franchot can assist bridging the medication just for a few days if possible.

## 2024-08-08 NOTE — Telephone Encounter (Signed)
 Please call patient and let her know that she needs to follow up with pain management since she has a pain contract with them. I am not able to prescribe her medication. She should keep her appointment with pain management and cancel the appointment with me.

## 2024-08-08 NOTE — Telephone Encounter (Signed)
 Called patient and relayed message from provider, she stated that she is not under contract with the pain clinic and that the provider told her to let her know if they were not helping her.  Reported that she guess the provider changed her mind and stated that she will be attending the appointment for pain management as she has no other choice.

## 2024-08-08 NOTE — Telephone Encounter (Signed)
 Called patient and relayed message from provider, she stated that she is not under contract with the pain clinic and that the provider told her to let her know if they were not helping her. Reported that she guess the provider changed her mind and stated that she will be attending the appointment for pain management as she has no other choice.  Also see other encounter from earlier at 1 something for more information.

## 2024-08-09 ENCOUNTER — Encounter: Admitting: Internal Medicine

## 2024-08-09 DIAGNOSIS — I87333 Chronic venous hypertension (idiopathic) with ulcer and inflammation of bilateral lower extremity: Secondary | ICD-10-CM | POA: Diagnosis not present

## 2024-08-10 ENCOUNTER — Ambulatory Visit: Admitting: Nurse Practitioner

## 2024-08-10 ENCOUNTER — Ambulatory Visit

## 2024-08-10 ENCOUNTER — Encounter: Payer: Self-pay | Admitting: Nurse Practitioner

## 2024-08-10 VITALS — BP 110/71 | HR 57 | Temp 97.7°F | Resp 18 | Ht 67.0 in | Wt 176.4 lb

## 2024-08-10 DIAGNOSIS — Z992 Dependence on renal dialysis: Secondary | ICD-10-CM | POA: Diagnosis present

## 2024-08-10 DIAGNOSIS — Z8673 Personal history of transient ischemic attack (TIA), and cerebral infarction without residual deficits: Secondary | ICD-10-CM | POA: Diagnosis present

## 2024-08-10 DIAGNOSIS — M79604 Pain in right leg: Secondary | ICD-10-CM

## 2024-08-10 DIAGNOSIS — N186 End stage renal disease: Secondary | ICD-10-CM | POA: Diagnosis present

## 2024-08-10 DIAGNOSIS — E1142 Type 2 diabetes mellitus with diabetic polyneuropathy: Secondary | ICD-10-CM | POA: Diagnosis present

## 2024-08-10 DIAGNOSIS — Z794 Long term (current) use of insulin: Secondary | ICD-10-CM | POA: Diagnosis not present

## 2024-08-10 DIAGNOSIS — M79605 Pain in left leg: Secondary | ICD-10-CM | POA: Diagnosis present

## 2024-08-10 DIAGNOSIS — Z7189 Other specified counseling: Secondary | ICD-10-CM

## 2024-08-10 DIAGNOSIS — Z0289 Encounter for other administrative examinations: Secondary | ICD-10-CM

## 2024-08-10 DIAGNOSIS — Z79899 Other long term (current) drug therapy: Secondary | ICD-10-CM | POA: Diagnosis present

## 2024-08-10 DIAGNOSIS — G894 Chronic pain syndrome: Secondary | ICD-10-CM | POA: Diagnosis present

## 2024-08-10 DIAGNOSIS — M130 Polyarthritis, unspecified: Secondary | ICD-10-CM | POA: Insufficient documentation

## 2024-08-10 MED ORDER — OXYCODONE HCL 5 MG PO TABS: 5.0000 mg | ORAL_TABLET | Freq: Two times a day (BID) | ORAL | 0 refills | Status: DC | PRN

## 2024-08-10 NOTE — Progress Notes (Signed)
 PROVIDER NOTE: Interpretation of information contained herein should be left to medically-trained personnel. Specific patient instructions are provided elsewhere under Patient Instructions section of medical record. This document was created in part using AI and STT-dictation technology, any transcriptional errors that may result from this process are unintentional.  Patient: [name]  Service: E/M   PCP: Franchot Isaiah LABOR, MD  DOB: 03-Mar-1966  DOS: 08/10/2024  Provider: Emmy MARLA Blanch, NP  MRN: [id number]  Delivery: Face-to-face  Specialty: Interventional Pain Management  Type: Established Patient  Setting: Ambulatory outpatient facility  Specialty designation: 09  Referring Prov.: No ref. provider found  Location: Outpatient office facility       History of present illness (HPI) [name], a 58 y.o. year old female, is here today because of her Pain in both lower extremities [M79.604, M79.605]. Ms. Sleeper primary complain today is Leg Pain (Bilateral Leg Pain )  Pertinent problems: [name] has Stage 5 chronic renal impairment associated with type 2 diabetes mellitus (HCC); ESRD on dialysis [facility]); Diabetic peripheral neuropathy (HCC); and PAD (peripheral artery disease) on their pertinent problem list.  Pain Assessment: Severity of Chronic pain is reported as a 4 /10. Location: Leg Right, Left/Denies. Onset: More than a month ago. Quality: Sharp. Timing: Intermittent. Modifying factor(s): Pain medication, changing dressings. Vitals:  height is 5' 7 (1.702 m) and weight is 176 lb 5.9 oz (80 kg). Her temporal temperature is 97.7 F (36.5 C). Her blood pressure is 110/71 and her pulse is 57 (abnormal). Her respiration is 18 and oxygen saturation is 96%.  BMI: Estimated body mass index is 27.62 kg/m as calculated from the following:   Height as of this encounter: 5' 7 (1.702 m).   Weight as of this encounter: 176 lb 5.9 oz (80 kg).  Last encounter: 08/07/2024. Last procedure:  08/07/2024.  Reason for encounter: medication management. No change in medical history since last visit.  Patient's pain is at baseline.  Patient continues multimodal pain regimen as prescribed.  States that it provides pain relief and improvement in functional status.   Discussed the use of AI scribe software for clinical note transcription with the patient, who gave verbal consent to proceed.  History of Present Illness   [name] is a 58 year old female with renal insufficiency who presents for pain management related to wound care.  She experiences severe pain during weekly wound care sessions, describing it as excruciating. The pain is associated with the scraping of her legs using a metal scraper to remove thick, white, gunky skin that will not regrow unless scraped off.  She is prescribed oxycodone , with a quantity of sixty tablets, to be taken twice daily. She takes one tablet an hour before wound care and another six hours later to manage the pain.  She has severe renal insufficiency, stating that she has no kidney function and does not urinate.  She experiences constipation as a side effect of oxycodone , which is managed with Miralax  powder, providing some relief.      Pharmacotherapy Assessment   Opioid Analgesic: Oxycodone  Hcl (Ir) 5 Mg Tablet. MME=15 Monitoring: [place] PMP: PDMP reviewed during this encounter.       Pharmacotherapy: No side-effects or adverse reactions reported. Compliance: No problems identified. Effectiveness: Clinically acceptable.  [name], [place]  08/10/2024  3:32 PM  Sign when Signing Visit Nursing Pain Medication Assessment:  Safety precautions to be maintained throughout the outpatient stay will include: orient to surroundings, keep bed in low position,  maintain call bell within reach at all times, provide assistance with transfer out of bed and ambulation.  Medication Inspection Compliance: [name] did not comply with our request to bring her  pills to be counted. She was reminded that bringing the medication bottles, even when empty, is a requirement.  Medication: None brought in. Pill/Patch Count: None available to be counted. Bottle Appearance: No container available. Did not bring bottle(s) to appointment. Filled Date: N/A Last Medication intake:  Ran out of medicine more than 48 hours ago    UDS:  No results found for: SUMMARY  No results found for: CBDTHCR No results found for: D8THCCBX No results found for: D9THCCBX  ROS  Constitutional: Denies any fever or chills Gastrointestinal: No reported hemesis, hematochezia, vomiting, or acute GI distress Musculoskeletal: bilateral leg pain Neurological: No reported episodes of acute onset apraxia, aphasia, dysarthria, agnosia, amnesia, paralysis, loss of coordination, or loss of consciousness  Medication Review  B Complex-C-Folic Acid , Dexcom G7 Sensor, Dupilumab, Sodium Thiosulfate, albuterol , insulin  aspart, ipratropium-albuterol , multivitamin, naloxone , oxyCODONE , pentoxifylline , and sevelamer  carbonate  History Review  Allergy: [name] is allergic to enalapril, ivp dye [iodinated contrast media], lisinopril, and prednisone . Drug: [name]  reports no history of drug use. Alcohol:  reports no history of alcohol use. Tobacco:  reports that she has never smoked. She has never used smokeless tobacco. Social: [name]  reports that she has never smoked. She has never used smokeless tobacco. She reports that she does not drink alcohol and does not use drugs. Medical:  has a past medical history of Allergy, Anemia, Anxiety, Asthma, Calciphylaxis, Chronic kidney disease, Diabetes mellitus without complication (HCC), GERD (gastroesophageal reflux disease), Heart murmur, High serum parathyroid hormone (PTH), History of kidney stones (2000), Hypertension, Neuromuscular disorder (HCC), Osteoporosis, and PONV (postoperative nausea and vomiting). Surgical: [name]  has a  past surgical history that includes Shoulder Closed Reduction (Right, 2004); Rotator cuff repair (Right, 2004); Abdominal hysterectomy (2007); AV Fistula Insertion w/RF Magnetic Guidance (N/A, 12/08/2017); UPPER EXTREMITY VENOGRAPHY (Left, 02/15/2018); DIALYSIS/PERMA CATHETER INSERTION (Right, 10/03/2021); Hip Arthroplasty (Left, 09/29/2021); Quadriceps tendon repair (Right, 10/16/2021); Incision and drainage hip (Left, 11/15/2021); Application if wound vac (Left, 11/15/2021); Hip Arthroplasty (Right, 12/14/2021); Application if wound vac (Right, 12/14/2021); and PACEMAKER LEADLESS INSERTION (N/A, 03/09/2024). Family: family history includes Alcohol abuse in her brother; COPD in her mother; Cancer in her father and paternal aunt; Cardiomyopathy in her brother; Diabetes in her brother and sister; Eczema in her sister; Emphysema in her mother; Hypertension in her brother and sister.  Laboratory Chemistry Profile   Renal Lab Results  Component Value Date   BUN 49 (H) 07/02/2024   CREATININE 7.40 (H) 07/02/2024   GFRAA 8 (L) 12/06/2017   GFRNONAA 6 (L) 07/02/2024    Hepatic Lab Results  Component Value Date   AST 14 (L) 07/02/2024   ALT 11 07/02/2024   ALBUMIN 3.7 07/02/2024   ALKPHOS 182 (H) 07/02/2024   HCVAB NON REACTIVE 05/03/2024    Electrolytes Lab Results  Component Value Date   NA 139 07/02/2024   K 4.6 07/02/2024   CL 88 (L) 07/02/2024   CALCIUM  11.1 (H) 07/02/2024   MG 2.0 05/01/2024   PHOS 3.3 05/01/2024    Bone Lab Results  Component Value Date   VD25OH 23.67 (L) 09/29/2021    Inflammation (CRP: Acute Phase) (ESR: Chronic Phase) Lab Results  Component Value Date   CRP 1.7 (H) 05/01/2024   ESRSEDRATE 3 05/01/2024   LATICACIDVEN 2.3 (HH)  07/02/2024         Note: Above Lab results reviewed.  Recent Imaging Review  CUP PACEART REMOTE DEVICE CHECK Leadless pacemaker: Scheduled remote reviewed. Normal device function.  Presenting rhythm: VP  Next remote transmission per  protocol.  ML, CVRS Note: Reviewed        Physical Exam  Vitals: BP 110/71 (BP Location: Right Arm, Patient Position: Sitting, Cuff Size: Normal)   Pulse (!) 57   Temp 97.7 F (36.5 C) (Temporal)   Resp 18   Ht 5' 7 (1.702 m)   Wt 176 lb 5.9 oz (80 kg)   SpO2 96%   BMI 27.62 kg/m  BMI: Estimated body mass index is 27.62 kg/m as calculated from the following:   Height as of this encounter: 5' 7 (1.702 m).   Weight as of this encounter: 176 lb 5.9 oz (80 kg). Ideal: Ideal body weight: 61.6 kg (135 lb 12.9 oz) Adjusted ideal body weight: 69 kg (152 lb 0.5 oz) General appearance: Well nourished, well developed, and well hydrated. In no apparent acute distress Mental status: Alert, oriented x 3 (person, place, & time)       Respiratory: No evidence of acute respiratory distress Eyes: PERLA  Musculoskeletal: Bilateral leg pain Assessment   Diagnosis Status  1. Pain in both lower extremities   2. Polyarthritis   3. Diabetic peripheral neuropathy (HCC)   4. Medication management   5. Chronic pain syndrome   6. Pain management contract discussed   7. Pain management contract signed   8. History of CVA (cerebrovascular accident)   9. ESRD on hemodialysis (HCC)    Controlled Controlled Controlled   Updated Problems: No problems updated.  Plan of Care  Problem-specific:  Assessment and Plan    Chronic pain syndrome involving both lower extremities Chronic pain syndrome with severe pain during wound care, exacerbated by necrotic tissue scraping. Oxycodone  use limited by renal concerns. - Prescribed oxycodone , 60 tablets, for wound care days as needed. - Discuss alternative pain management with Dr. Lazarus, considering renal function. - Consider additional medication for wound care days after discussion with Dr. Lazarus.  End stage renal disease on hemodialysis End stage renal disease with no kidney function. Renal function critical in medication management, especially pain  medications and NSAIDs. - Consult Dr. Lazarus, nephrologist, for safe pain management options. - Avoid NSAIDs due to renal impairment risk.  Medication management Focus on balancing pain control with renal function preservation. Current regimen includes oxycodone , with constipation management. - Continue oxycodone  regimen cautiously, not exceeding prescribed dosage. - Use Miralax  for constipation as needed.  Patient's pain is controlled with oxycodone , continue on current medication regimen.  Prescribing drug monitoring (PDMP) reviewed, findings consistent with the use of prescribed medication and no evidence of narcotic misuse or abuse.  Urine drug screening (UDS) up to date.  The patient was advised to use continue MiraLAX  for opioid-induced constipation.  Schedule follow-up in 30 days for medication management.      Ms. Jeris Easterly has a current medication list which includes the following long-term medication(s): albuterol , dexcom g7 sensor, dexcom g7 sensor, and ipratropium-albuterol .  Pharmacotherapy (Medications Ordered): Meds ordered this encounter  Medications   oxyCODONE  (OXY IR/ROXICODONE ) 5 MG immediate release tablet    Sig: Take 1 tablet (5 mg total) by mouth every 12 (twelve) hours as needed for severe pain (pain score 7-10). Must last 30 days.    Dispense:  60 tablet    Refill:  0  Chronic Pain: STOP Act (Not applicable) Fill 1 day early if closed on refill date. Avoid benzodiazepines within 8 hours of opioids   Orders:  No orders of the defined types were placed in this encounter.       Return in about 1 month (around 09/10/2024) for (F2F), (MM), Emmy Blanch NP.    Recent Visits Date Type Provider Dept  07/10/24 Office Visit Lavinia Mcneely K, NP [facility]  07/03/24 Office Visit Marcelino Nurse, MD [facility]  Showing recent visits within past 90 days and meeting all other requirements Today's Visits Date Type Provider Dept  08/10/24 Office  Visit Celvin Taney K, NP [facility]  Showing today's visits and meeting all other requirements Future Appointments Date Type Provider Dept  09/07/24 Appointment Molina Hollenback K, NP [facility]  Showing future appointments within next 90 days and meeting all other requirements  I discussed the assessment and treatment plan with the patient. The patient was provided an opportunity to ask questions and all were answered. The patient agreed with the plan and demonstrated an understanding of the instructions.  Patient advised to call back or seek an in-person evaluation if the symptoms or condition worsens.  I personally spent a total of 30 minutes in the care of the patient today including preparing to see the patient, getting/reviewing separately obtained history, performing a medically appropriate exam/evaluation, counseling and educating, placing orders, referring and communicating with other health care professionals, documenting clinical information in the EHR, independently interpreting results, communicating results, and coordinating care.   Note by: Jatavis Malek K Che Below, NP (TTS and AI technology used. I apologize for any typographical errors that were not detected and corrected.) Date: 08/10/2024; Time: 3:40 PM

## 2024-08-10 NOTE — Progress Notes (Signed)
 Nursing Pain Medication Assessment:  Safety precautions to be maintained throughout the outpatient stay will include: orient to surroundings, keep bed in low position, maintain call bell within reach at all times, provide assistance with transfer out of bed and ambulation.  Medication Inspection Compliance: [name] did not comply with our request to bring her pills to be counted. She was reminded that bringing the medication bottles, even when empty, is a requirement.  Medication: None brought in. Pill/Patch Count: None available to be counted. Bottle Appearance: No container available. Did not bring bottle(s) to appointment. Filled Date: N/A Last Medication intake:  Ran out of medicine more than 48 hours ago

## 2024-08-16 ENCOUNTER — Encounter: Admitting: Internal Medicine

## 2024-08-16 DIAGNOSIS — I87333 Chronic venous hypertension (idiopathic) with ulcer and inflammation of bilateral lower extremity: Secondary | ICD-10-CM | POA: Diagnosis not present

## 2024-08-23 ENCOUNTER — Encounter: Admitting: Physician Assistant

## 2024-08-23 DIAGNOSIS — I87333 Chronic venous hypertension (idiopathic) with ulcer and inflammation of bilateral lower extremity: Secondary | ICD-10-CM | POA: Diagnosis not present

## 2024-08-30 ENCOUNTER — Observation Stay

## 2024-08-30 ENCOUNTER — Encounter: Admitting: Physician Assistant

## 2024-08-30 ENCOUNTER — Other Ambulatory Visit: Payer: Self-pay

## 2024-08-30 ENCOUNTER — Inpatient Hospital Stay
Admission: EM | Admit: 2024-08-30 | Discharge: 2024-09-02 | DRG: 602 | Disposition: A | Discharge status: Home / Self Care | Attending: Obstetrics and Gynecology | Admitting: Obstetrics and Gynecology

## 2024-08-30 DIAGNOSIS — I132 Hypertensive heart and chronic kidney disease with heart failure and with stage 5 chronic kidney disease, or end stage renal disease: Secondary | ICD-10-CM | POA: Diagnosis present

## 2024-08-30 DIAGNOSIS — I442 Atrioventricular block, complete: Secondary | ICD-10-CM | POA: Diagnosis present

## 2024-08-30 DIAGNOSIS — E1122 Type 2 diabetes mellitus with diabetic chronic kidney disease: Secondary | ICD-10-CM | POA: Diagnosis present

## 2024-08-30 DIAGNOSIS — Z794 Long term (current) use of insulin: Secondary | ICD-10-CM

## 2024-08-30 DIAGNOSIS — L97219 Non-pressure chronic ulcer of right calf with unspecified severity: Secondary | ICD-10-CM | POA: Diagnosis present

## 2024-08-30 DIAGNOSIS — L97229 Non-pressure chronic ulcer of left calf with unspecified severity: Secondary | ICD-10-CM | POA: Diagnosis present

## 2024-08-30 DIAGNOSIS — I739 Peripheral vascular disease, unspecified: Secondary | ICD-10-CM | POA: Diagnosis present

## 2024-08-30 DIAGNOSIS — Z950 Presence of cardiac pacemaker: Secondary | ICD-10-CM

## 2024-08-30 DIAGNOSIS — N2581 Secondary hyperparathyroidism of renal origin: Secondary | ICD-10-CM | POA: Diagnosis present

## 2024-08-30 DIAGNOSIS — Z79899 Other long term (current) drug therapy: Secondary | ICD-10-CM

## 2024-08-30 DIAGNOSIS — L089 Local infection of the skin and subcutaneous tissue, unspecified: Secondary | ICD-10-CM | POA: Diagnosis present

## 2024-08-30 DIAGNOSIS — Z8249 Family history of ischemic heart disease and other diseases of the circulatory system: Secondary | ICD-10-CM

## 2024-08-30 DIAGNOSIS — Z96643 Presence of artificial hip joint, bilateral: Secondary | ICD-10-CM | POA: Diagnosis present

## 2024-08-30 DIAGNOSIS — T148XXA Other injury of unspecified body region, initial encounter: Secondary | ICD-10-CM

## 2024-08-30 DIAGNOSIS — Z888 Allergy status to other drugs, medicaments and biological substances status: Secondary | ICD-10-CM

## 2024-08-30 DIAGNOSIS — J45909 Unspecified asthma, uncomplicated: Secondary | ICD-10-CM | POA: Diagnosis present

## 2024-08-30 DIAGNOSIS — I5020 Unspecified systolic (congestive) heart failure: Secondary | ICD-10-CM | POA: Diagnosis present

## 2024-08-30 DIAGNOSIS — E11621 Type 2 diabetes mellitus with foot ulcer: Secondary | ICD-10-CM | POA: Diagnosis present

## 2024-08-30 DIAGNOSIS — L03115 Cellulitis of right lower limb: Principal | ICD-10-CM | POA: Diagnosis present

## 2024-08-30 DIAGNOSIS — D631 Anemia in chronic kidney disease: Secondary | ICD-10-CM | POA: Diagnosis present

## 2024-08-30 DIAGNOSIS — Z833 Family history of diabetes mellitus: Secondary | ICD-10-CM

## 2024-08-30 DIAGNOSIS — E1165 Type 2 diabetes mellitus with hyperglycemia: Secondary | ICD-10-CM | POA: Diagnosis present

## 2024-08-30 DIAGNOSIS — L03116 Cellulitis of left lower limb: Secondary | ICD-10-CM | POA: Diagnosis present

## 2024-08-30 DIAGNOSIS — Z90710 Acquired absence of both cervix and uterus: Secondary | ICD-10-CM

## 2024-08-30 DIAGNOSIS — I5032 Chronic diastolic (congestive) heart failure: Secondary | ICD-10-CM | POA: Diagnosis present

## 2024-08-30 DIAGNOSIS — K219 Gastro-esophageal reflux disease without esophagitis: Secondary | ICD-10-CM | POA: Diagnosis present

## 2024-08-30 DIAGNOSIS — N186 End stage renal disease: Secondary | ICD-10-CM | POA: Diagnosis present

## 2024-08-30 DIAGNOSIS — E872 Acidosis, unspecified: Secondary | ICD-10-CM | POA: Diagnosis present

## 2024-08-30 DIAGNOSIS — E1151 Type 2 diabetes mellitus with diabetic peripheral angiopathy without gangrene: Secondary | ICD-10-CM | POA: Diagnosis present

## 2024-08-30 DIAGNOSIS — Z992 Dependence on renal dialysis: Secondary | ICD-10-CM

## 2024-08-30 DIAGNOSIS — E785 Hyperlipidemia, unspecified: Secondary | ICD-10-CM | POA: Diagnosis present

## 2024-08-30 DIAGNOSIS — Z91041 Radiographic dye allergy status: Secondary | ICD-10-CM

## 2024-08-30 LAB — COMPREHENSIVE METABOLIC PANEL WITH GFR
ALT: <5 U/L (ref 0–44)
AST: 15 U/L (ref 15–41)
Albumin: 3.9 g/dL (ref 3.5–5.0)
Alkaline Phosphatase: 230 U/L — ABNORMAL HIGH (ref 38–126)
Anion gap: 28 — ABNORMAL HIGH (ref 5–15)
BUN: 12 mg/dL (ref 6–20)
CO2: 21 mmol/L — ABNORMAL LOW (ref 22–32)
Calcium: 9.9 mg/dL (ref 8.9–10.3)
Chloride: 92 mmol/L — ABNORMAL LOW (ref 98–111)
Creatinine, Ser: 2.53 mg/dL — ABNORMAL HIGH (ref 0.44–1.00)
GFR, Estimated: 21 mL/min — ABNORMAL LOW
Glucose, Bld: 150 mg/dL — ABNORMAL HIGH (ref 70–99)
Potassium: 3.7 mmol/L (ref 3.5–5.1)
Sodium: 141 mmol/L (ref 135–145)
Total Bilirubin: 0.6 mg/dL (ref 0.0–1.2)
Total Protein: 7 g/dL (ref 6.5–8.1)

## 2024-08-30 LAB — CBC WITH DIFFERENTIAL/PLATELET
Abs Immature Granulocytes: 0.05 K/uL (ref 0.00–0.07)
Basophils Absolute: 0.1 K/uL (ref 0.0–0.1)
Basophils Relative: 1 %
Eosinophils Absolute: 0.1 K/uL (ref 0.0–0.5)
Eosinophils Relative: 1 %
HCT: 38.7 % (ref 36.0–46.0)
Hemoglobin: 11.7 g/dL — ABNORMAL LOW (ref 12.0–15.0)
Immature Granulocytes: 1 %
Lymphocytes Relative: 5 %
Lymphs Abs: 0.4 K/uL — ABNORMAL LOW (ref 0.7–4.0)
MCH: 30.5 pg (ref 26.0–34.0)
MCHC: 30.2 g/dL (ref 30.0–36.0)
MCV: 100.8 fL — ABNORMAL HIGH (ref 80.0–100.0)
Monocytes Absolute: 0.6 K/uL (ref 0.1–1.0)
Monocytes Relative: 7 %
Neutro Abs: 6.5 K/uL (ref 1.7–7.7)
Neutrophils Relative %: 85 %
Platelets: 292 K/uL (ref 150–400)
RBC: 3.84 MIL/uL — ABNORMAL LOW (ref 3.87–5.11)
RDW: 17.2 % — ABNORMAL HIGH (ref 11.5–15.5)
WBC: 7.6 K/uL (ref 4.0–10.5)
nRBC: 0.3 % — ABNORMAL HIGH (ref 0.0–0.2)

## 2024-08-30 LAB — LACTIC ACID, PLASMA
Lactic Acid, Venous: 2.1 mmol/L (ref 0.5–1.9)
Lactic Acid, Venous: 2.8 mmol/L (ref 0.5–1.9)
Lactic Acid, Venous: 3.1 mmol/L (ref 0.5–1.9)

## 2024-08-30 LAB — MAGNESIUM: Magnesium: 2 mg/dL (ref 1.7–2.4)

## 2024-08-30 LAB — SEDIMENTATION RATE: Sed Rate: 63 mm/h — ABNORMAL HIGH (ref 0–30)

## 2024-08-30 LAB — GLUCOSE, CAPILLARY: Glucose-Capillary: 184 mg/dL — ABNORMAL HIGH (ref 70–99)

## 2024-08-30 MED ORDER — OXYCODONE HCL 5 MG PO TABS
10.0000 mg | ORAL_TABLET | ORAL | Status: AC
  Administered 2024-08-30: 10 mg via ORAL
  Filled 2024-08-30: qty 2

## 2024-08-30 MED ORDER — INSULIN ASPART 100 UNIT/ML IJ SOLN
0.0000 [IU] | Freq: Every day | INTRAMUSCULAR | Status: DC
  Administered 2024-09-01: 3 [IU] via SUBCUTANEOUS
  Filled 2024-08-30: qty 3

## 2024-08-30 MED ORDER — VANCOMYCIN HCL 1750 MG/350ML IV SOLN
1750.0000 mg | Freq: Once | INTRAVENOUS | Status: AC
  Administered 2024-08-30: 1750 mg via INTRAVENOUS
  Filled 2024-08-30: qty 350

## 2024-08-30 MED ORDER — ACETAMINOPHEN 325 MG PO TABS
650.0000 mg | ORAL_TABLET | Freq: Four times a day (QID) | ORAL | Status: DC | PRN
  Administered 2024-08-30: 650 mg via ORAL
  Filled 2024-08-30: qty 2

## 2024-08-30 MED ORDER — HYDROMORPHONE HCL 1 MG/ML IJ SOLN
0.5000 mg | INTRAMUSCULAR | Status: DC | PRN
  Administered 2024-08-30 – 2024-08-31 (×5): 0.5 mg via INTRAVENOUS
  Filled 2024-08-30 (×5): qty 0.5

## 2024-08-30 MED ORDER — SODIUM CHLORIDE 0.9 % IV SOLN
2.0000 g | Freq: Once | INTRAVENOUS | Status: AC
  Administered 2024-08-30: 2 g via INTRAVENOUS
  Filled 2024-08-30: qty 12.5

## 2024-08-30 MED ORDER — ALBUMIN HUMAN 25 % IV SOLN
25.0000 g | Freq: Once | INTRAVENOUS | Status: AC
  Administered 2024-08-30: 25 g via INTRAVENOUS
  Filled 2024-08-30: qty 100

## 2024-08-30 MED ORDER — ONDANSETRON HCL 4 MG PO TABS: 4.0000 mg | ORAL_TABLET | Freq: Four times a day (QID) | ORAL | Status: DC | PRN

## 2024-08-30 MED ORDER — ONDANSETRON HCL 4 MG/2ML IJ SOLN: 4.0000 mg | Freq: Four times a day (QID) | INTRAMUSCULAR | Status: DC | PRN

## 2024-08-30 MED ORDER — SODIUM CHLORIDE 0.9% FLUSH
3.0000 mL | Freq: Two times a day (BID) | INTRAVENOUS | Status: DC
  Administered 2024-08-30 – 2024-09-02 (×6): 3 mL via INTRAVENOUS

## 2024-08-30 MED ORDER — ACETAMINOPHEN 650 MG RE SUPP: 650.0000 mg | Freq: Four times a day (QID) | RECTAL | Status: DC | PRN

## 2024-08-30 MED ORDER — INSULIN ASPART 100 UNIT/ML IJ SOLN
0.0000 [IU] | Freq: Three times a day (TID) | INTRAMUSCULAR | Status: DC
  Administered 2024-08-31: 1 [IU] via SUBCUTANEOUS
  Administered 2024-08-31: 2 [IU] via SUBCUTANEOUS
  Administered 2024-09-01: 3 [IU] via SUBCUTANEOUS
  Administered 2024-09-01 – 2024-09-02 (×2): 1 [IU] via SUBCUTANEOUS
  Administered 2024-09-02: 3 [IU] via SUBCUTANEOUS
  Filled 2024-08-30 (×2): qty 1
  Filled 2024-08-30: qty 2
  Filled 2024-08-30 (×2): qty 3
  Filled 2024-08-30: qty 1

## 2024-08-30 MED ORDER — SENNOSIDES-DOCUSATE SODIUM 8.6-50 MG PO TABS: 1.0000 | ORAL_TABLET | Freq: Every evening | ORAL | Status: DC | PRN

## 2024-08-30 MED ORDER — HEPARIN SODIUM (PORCINE) 5000 UNIT/ML IJ SOLN
5000.0000 [IU] | Freq: Three times a day (TID) | INTRAMUSCULAR | Status: DC
  Filled 2024-08-30 (×2): qty 1

## 2024-08-30 NOTE — ED Notes (Signed)
 Lab called for lactic collection d/t difficult stick. 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

## 2024-08-30 NOTE — ED Provider Notes (Signed)
 "  [facility] Provider Note   Event Date/Time   First MD Initiated Contact with Patient 08/30/24 1619     (approximate)  History   Wound Check  HPI  [name] is a 58 y.o. female with a history of chronic wounds. I spoke with H Stone at wound clinic. Advises concerns of increasing redness and infection in her legs for about a week  No fever  Increasing pains over the areas of ulcers and redness.  Takes oxycodone , reports it has not been helpful but feels like a higher dose might be needed  Wounds better last week. This week, redness to calves. Concerns for superinfection on top of caliphylaxis. No fevers.Wound clinic already sent wound culture/pcr today. Feels unlikely pseudomonas, needs needs staph/strep type coverage.   Vanc/cefepine resonable start point.    Reviewed external records from Dr. Andre of wound care.  Concerns for calciphylaxis, wound clinic recommending IV antibiotic therapy  Past Medical History:  Diagnosis Date   Allergy    Anemia    vitamin d3 deficiency   Anxiety    Asthma    Calciphylaxis    Chronic kidney disease    End Stage Renal Disease   Diabetes mellitus without complication (HCC)    GERD (gastroesophageal reflux disease)    nothing over last few years   Heart murmur    High serum parathyroid hormone (PTH)    checked through Dialysis   History of kidney stones 2000   Hypertension    Neuromuscular disorder (HCC)    neuropathy in feet   Osteoporosis    PONV (postoperative nausea and vomiting)    severe nausea requiring many doses of post op antiemetics     Physical Exam   Triage Vital Signs: ED Triage Vitals  Encounter Vitals Group     BP 08/30/24 1329 97/66     Girls Systolic BP Percentile --      Girls Diastolic BP Percentile --      Boys Systolic BP Percentile --      Boys Diastolic BP Percentile --      Pulse Rate 08/30/24 1329 85     Resp 08/30/24 1329 18     Temp 08/30/24 1329 97.7 F (36.5 C)      Temp Source 08/30/24 1329 Oral     SpO2 08/30/24 1329 93 %     Weight 08/30/24 1329 176 lb 5.9 oz (80 kg)     Height 08/30/24 1329 5' 7 (1.702 m)     Head Circumference --      Peak Flow --      Pain Score 08/30/24 1342 9     Pain Loc --      Pain Education --      Exclude from Growth Chart --     Most recent vital signs: Vitals:   08/30/24 1329 08/30/24 1606  BP: 97/66 99/67  Pulse: 85 88  Resp: 18 19  Temp: 97.7 F (36.5 C) 98 F (36.7 C)  SpO2: 93% 100%     General: Awake, no distress.  CV:   Good peripheral perfusion. Normal rate and heart tones. Resp:    Speaking without distress. Abd:  No focal neuro deficits noted. Moves extremities well without noted concern. Other:  Reports she had hemodialysis today without issue   ED Results / Procedures / Treatments   Labs (all labs ordered are listed, but only abnormal results are displayed) Labs Reviewed  COMPREHENSIVE METABOLIC PANEL WITH GFR -  Abnormal; Notable for the following components:      Result Value   Chloride 92 (*)    CO2 21 (*)    Glucose, Bld 150 (*)    Creatinine, Ser 2.53 (*)    Alkaline Phosphatase 230 (*)    GFR, Estimated 21 (*)    Anion gap 28 (*)    All other components within normal limits  CBC WITH DIFFERENTIAL/PLATELET - Abnormal; Notable for the following components:   RBC 3.84 (*)    Hemoglobin 11.7 (*)    MCV 100.8 (*)    RDW 17.2 (*)    nRBC 0.3 (*)    Lymphs Abs 0.4 (*)    All other components within normal limits  LACTIC ACID, PLASMA - Abnormal; Notable for the following components:   Lactic Acid, Venous 2.1 (*)    All other components within normal limits  SEDIMENTATION RATE - Abnormal; Notable for the following components:   Sed Rate 63 (*)    All other components within normal limits  CULTURE, BLOOD (ROUTINE X 2)  CULTURE, BLOOD (ROUTINE X 2)  AEROBIC CULTURE W GRAM STAIN (SUPERFICIAL SPECIMEN)  MAGNESIUM   BASIC METABOLIC PANEL WITH GFR  CBC  C-REACTIVE PROTEIN   LACTIC ACID, PLASMA   Labs show chronic renal disease.  No acute lab abnormalities of significant concern.  White count is normal  EKG    RADIOLOGY      PROCEDURES:  Critical Care performed: No  Procedures   MEDICATIONS ORDERED IN ED: Medications  vancomycin  (VANCOREADY) IVPB 1750 mg/350 mL (1,750 mg Intravenous New Bag/Given 08/30/24 1813)  sodium chloride  flush (NS) 0.9 % injection 3 mL (has no administration in time range)  acetaminophen  (TYLENOL ) tablet 650 mg (has no administration in time range)    Or  acetaminophen  (TYLENOL ) suppository 650 mg (has no administration in time range)  senna-docusate (Senokot-S) tablet 1 tablet (has no administration in time range)  heparin  injection 5,000 Units (5,000 Units Subcutaneous Patient Refused/Not Given 08/30/24 1813)  insulin  aspart (novoLOG ) injection 0-5 Units (has no administration in time range)  insulin  aspart (novoLOG ) injection 0-9 Units (has no administration in time range)  ondansetron  (ZOFRAN ) tablet 4 mg (has no administration in time range)    Or  ondansetron  (ZOFRAN ) injection 4 mg (has no administration in time range)  oxyCODONE  (Oxy IR/ROXICODONE ) immediate release tablet 10 mg (10 mg Oral Given 08/30/24 1654)  ceFEPIme  (MAXIPIME ) 2 g in sodium chloride  0.9 % 100 mL IVPB (0 g Intravenous Stopped 08/30/24 1759)     IMPRESSION / MDM / ASSESSMENT AND PLAN / ED COURSE  I reviewed the triage vital signs and the nursing notes.                              Based on presentation, the differential diagnosis includes, but is not limited to key considerations: Cellulitis, superinfection, wound infection, worsening calciphylaxis, etc.  She does not exhibit systemic symptoms.  She is awake alert and oriented.  Patient's presentation is most consistent with acute complicated illness / injury requiring diagnostic workup.   Clinical Course as of 08/30/24 1916  Wed Aug 30, 2024  1914 Labs reveal very minimally  elevated lactic.  Patient also dialysis patient.  Hemodynamics and examination highly suggestive against severe metabolic derangement at this time. [MQ]  1914 Does not meet sepsis criteria [MQ]    Clinical Course User Index [MQ] Dicky Anes, MD   Admitted to hospitalist  and care Dr. Fernand  ----------------------------------------- 7:16 PM on 08/30/2024 ----------------------------------------- Patient fully awake and alert reports oxycodone  has helped discomfort a lot.  She is very pleasant in no distress husband at bedside agreeable with admission  FINAL CLINICAL IMPRESSION(S) / ED DIAGNOSES   Final diagnoses:  Bilateral cellulitis of lower leg     Rx / DC Orders   ED Discharge Orders     None        Note:  This document was prepared using Dragon voice recognition software and may include unintentional dictation errors.   Dicky Anes, MD 08/30/24 [phone]  "

## 2024-08-30 NOTE — H&P (Signed)
 " History and Physical    [name] FMW:[id number] DOB: 09-15-1965 DOA: 08/30/2024  DOS: the patient was seen and examined on 08/30/2024  PCP: Franchot Isaiah LABOR, MD   Patient coming from: Home  I have personally briefly reviewed patient's old medical records in [facility] and CareEverywhere  HPI:   [name] is a 58 y.o. 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  Patient states she has been having worsening pain and redness on her bilateral lower extremities despite taking her pain medication. On arrival to the ED patient was noted to be HDS stable.  Lab work obtained.  CBC without leukocytosis, mild anemia that is borderline macrocytic.  CMP with ESRD changes along with moderate hyperglycemia, ALP elevation, and chronic anion gap metabolic acidosis.  ESR checked and elevated at 63.  Blood cultures ordered, wound cultures ordered.  Wound cultures were obtained by wound center.  Plain radiography of the lower extremities shows right x-ray showing changes consistent with chronic osteomyelitis along with diffuse soft tissue edema with left x-ray showing diffuse subcutaneous edema but no gas formation.  Given patient presentation and wound care recommendations of IV antibiotics, TRH contacted for admission.  Review of Systems: As mentioned in the history of present illness. All other systems reviewed and are negative.   Past Medical History:  Diagnosis Date   Allergy    Anemia    vitamin d3 deficiency   Anxiety    Asthma    Calciphylaxis    Chronic kidney disease    End Stage Renal Disease   Diabetes mellitus without complication (HCC)    GERD (gastroesophageal reflux disease)    nothing over last few years   Heart murmur    High serum parathyroid hormone (PTH)    checked through Dialysis   History of  kidney stones 2000   Hypertension    Neuromuscular disorder (HCC)    neuropathy in feet   Osteoporosis    PONV (postoperative nausea and vomiting)    severe nausea requiring many doses of post op antiemetics    Past Surgical History:  Procedure Laterality Date   ABDOMINAL HYSTERECTOMY  2007   APPLICATION OF WOUND VAC Left 11/15/2021   Procedure: APPLICATION OF WOUND VAC;  Surgeon: Marchia Drivers, MD;  Location: [facility] ORS;  Service: Orthopedics;  Laterality: Left;  Prevena 13cm    APPLICATION OF WOUND VAC Right 12/14/2021   Procedure: APPLICATION OF WOUND VAC;  Surgeon: Tobie Priest, MD;  Location: [facility] ORS;  Service: Orthopedics;  Laterality: Right;  [id number]   AV FISTULA INSERTION W/ RF MAGNETIC GUIDANCE N/A 12/08/2017   Procedure: AV FISTULA INSERTION W/RF MAGNETIC GUIDANCE;  Surgeon: Jama Cordella MATSU, MD;  Location: [facility] INVASIVE CV [facility];  Service: Cardiovascular;  Laterality: N/A;   DIALYSIS/PERMA CATHETER INSERTION Right 10/03/2021   Procedure: DIALYSIS/PERMA CATHETER INSERTION;  Surgeon: Jama Cordella MATSU, MD;  Location: [facility] INVASIVE CV LAB;  Service: Cardiovascular;  Laterality: Right;   HIP ARTHROPLASTY Left 09/29/2021   Procedure: ARTHROPLASTY BIPOLAR HIP (HEMIARTHROPLASTY);  Surgeon: Leora Lynwood SAUNDERS, MD;  Location: [facility];  Service: Orthopedics;  Laterality: Left;   HIP ARTHROPLASTY Right 12/14/2021   Procedure: ARTHROPLASTY BIPOLAR HIP (HEMIARTHROPLASTY);  Surgeon: Tobie Priest, MD;  Location: [facility];  Service: Orthopedics;  Laterality: Right;   INCISION AND DRAINAGE HIP Left 11/15/2021   Procedure: IRRIGATION AND  DEBRIDEMENT LEFT HIP WOUND;  Surgeon: Marchia Drivers, MD;  Location: [facility];  Service: Orthopedics;  Laterality: Left;   PACEMAKER LEADLESS INSERTION N/A 03/09/2024   Procedure: PACEMAKER LEADLESS INSERTION;  Surgeon: Nancey Eulas BRAVO, MD;  Location: [facility];  Service: Cardiovascular;  Laterality: N/A;   QUADRICEPS TENDON REPAIR Right 10/16/2021    Procedure: REPAIR QUADRICEP TENDON;  Surgeon: Doll Skates, MD;  Location: [facility];  Service: Orthopedics;  Laterality: Right;   ROTATOR CUFF REPAIR Right 2004   SHOULDER CLOSED REDUCTION Right 2004   UPPER EXTREMITY VENOGRAPHY Left 02/15/2018   Procedure: UPPER EXTREMITY VENOGRAPHY;  Surgeon: Jama Cordella MATSU, MD;  Location: [facility];  Service: Cardiovascular;  Laterality: Left;     Allergies[1]  Family History  Problem Relation Age of Onset   Emphysema Mother    COPD Mother    Cancer Father    Cancer Paternal Aunt    Diabetes Sister    Hypertension Sister    Eczema Sister    Diabetes Brother    Hypertension Brother    Cardiomyopathy Brother    Alcohol abuse Brother     Prior to Admission medications  Medication Sig Start Date End Date Taking? Authorizing Provider  albuterol  (VENTOLIN  HFA) 108 (90 Base) MCG/ACT inhaler Inhale 2 puffs into the lungs every 6 (six) hours as needed for wheezing or shortness of breath. 02/20/24  Yes [name], MD  B Complex-C-Folic Acid  (RENA-VITE PO) Take 1 tablet by mouth daily. 05/09/24  Yes [provider]  Dupilumab 300 MG/2ML SOAJ Inject 300 mg into the skin. 05/19/24  Yes [provider]  ipratropium-albuterol  (DUONEB) 0.5-2.5 (3) MG/3ML SOLN Take 3 mLs by nebulization 4 (four) times daily.   Yes [provider]  NOVOLOG  FLEXPEN 100 UNIT/ML FlexPen Inject 14 Units into the skin 3 (three) times daily with meals.   Yes [provider]  oxyCODONE  (OXY IR/ROXICODONE ) 5 MG immediate release tablet Take 1 tablet (5 mg total) by mouth every 12 (twelve) hours as needed for severe pain (pain score 7-10). Must last 30 days. 08/11/24 09/10/24 Yes Patel, Seema K, NP  pentoxifylline  (TRENTAL ) 400 MG CR tablet Take 1 tablet (400 mg total) by mouth daily. 05/09/24  Yes Claudene Lehmann, MD  sevelamer  carbonate (RENVELA ) 800 MG tablet Take 2,400 mg by mouth 3 (three) times daily with meals. 12/18/23  Yes  [provider]  SODIUM THIOSULFATE IV Inject 12.5 mg into the vein 3 (three) times a week.   Yes [provider]  Continuous Glucose Sensor (DEXCOM G7 SENSOR) MISC Use to check blood sugar as needed for insulin  dependent type 2 diabetes 07/25/24   Franchot Isaiah LABOR, MD  Continuous Glucose Sensor (DEXCOM G7 SENSOR) MISC Use to monitor blood sugar 07/26/24   Franchot Isaiah LABOR, MD  naloxone  (NARCAN ) nasal spray 4 mg/0.1 mL Place 1 spray into the nose as needed for up to 365 doses (for opioid-induced respiratory depresssion). In case of emergency (overdose), spray once into each nostril. If no response within 3 minutes, repeat application and call 911. Patient not taking: Reported on 08/30/2024 07/10/24 07/10/25  Tobie Emmy POUR, NP    Social History:  reports that she has never smoked. She has never used smokeless tobacco. She reports that she does not drink alcohol and does not use drugs.    Physical Exam: Vitals:   08/30/24 1329 08/30/24 1606  BP: 97/66 99/67  Pulse: 85 88  Resp: 18 19  Temp: 97.7 F (  36.5 C) 98 F (36.7 C)  TempSrc: Oral   SpO2: 93% 100%  Weight: 80 kg   Height: 5' 7 (1.702 m)     Gen: NAD HENT: NCAT CV: normal heart sounds Lung: CTAB Abd: No TTP, normal bowel sounds MSK: No asymmetry, bilateral lower extremities wrapped earlier today. Reviewed pictures taken at outpatient clinic earlier today at wound care appointment. Nursing to redress later once pt receives pain medication.  Neuro: alert and oriented   Labs on Admission: I have personally reviewed following labs and imaging studies  CBC: Recent Labs  Lab 08/30/24 1348  WBC 7.6  NEUTROABS 6.5  HGB 11.7*  HCT 38.7  MCV 100.8*  PLT 292   Basic Metabolic Panel: Recent Labs  Lab 08/30/24 1348  NA 141  K 3.7  CL 92*  CO2 21*  GLUCOSE 150*  BUN 12  CREATININE 2.53*  CALCIUM  9.9  MG 2.0   GFR: Estimated Creatinine Clearance: 25.8 mL/min (A) (by C-G formula based on SCr  of 2.53 mg/dL (H)). Liver Function Tests: Recent Labs  Lab 08/30/24 1348  AST 15  ALT <5  ALKPHOS 230*  BILITOT 0.6  PROT 7.0  ALBUMIN 3.9   No results for input(s): LIPASE, AMYLASE in the last 168 hours. No results for input(s): AMMONIA in the last 168 hours. Coagulation Profile: No results for input(s): INR, PROTIME in the last 168 hours. Cardiac Enzymes: No results for input(s): CKTOTAL, CKMB, CKMBINDEX, TROPONINI, TROPONINIHS in the last 168 hours. BNP (last 3 results) Recent Labs    03/19/24 2233 05/02/24 1120  BNP 2,966.4* 2,615.5*   HbA1C: No results for input(s): HGBA1C in the last 72 hours. CBG: No results for input(s): GLUCAP in the last 168 hours. Lipid Profile: No results for input(s): CHOL, HDL, LDLCALC, TRIG, CHOLHDL, LDLDIRECT in the last 72 hours. Thyroid  Function Tests: No results for input(s): TSH, T4TOTAL, FREET4, T3FREE, THYROIDAB in the last 72 hours. Anemia Panel: No results for input(s): VITAMINB12, FOLATE, FERRITIN, TIBC, IRON , RETICCTPCT in the last 72 hours. Urine analysis: No results found for: COLORURINE, APPEARANCEUR, LABSPEC, PHURINE, GLUCOSEU, HGBUR, BILIRUBINUR, KETONESUR, PROTEINUR, UROBILINOGEN, NITRITE, LEUKOCYTESUR  Radiological Exams on Admission: I have personally reviewed images DG Tibia/Fibula Right Result Date: 08/30/2024 EXAM: 2 VIEW(S) XRAY OF THE RIGHT TIBIA AND FIBULA 08/30/2024 05:40:32 PM COMPARISON: None available. CLINICAL HISTORY: Complicated wound infection. FINDINGS: BONES AND JOINTS: There is some periosteal reaction along the mid and proximal diaphysis of the fibula. No cortical erosions are identified. No acute fracture. No malalignment. SOFT TISSUES: Diffuse soft tissue edema. Vascular calcifications and scattered phleboliths noted. IMPRESSION: 1. Periosteal reaction along the mid and proximal diaphysis of the fibula, with no cortical  erosions identified. Findings can be seen in the setting of chronic osteomyelitis . 2. Diffuse soft tissue edema. Electronically signed by: Greig Pique MD 08/30/2024 06:55 PM EST RP Workstation: [id number]   DG Tibia/Fibula Left Result Date: 08/30/2024 CLINICAL DATA:  Wound infection. EXAM: DG TIBIA/FIBULA 2V*L* COMPARISON:  Radiograph dated 03/22/2024. FINDINGS: No acute fracture or dislocation. The bones are osteopenic. There is diffuse subcutaneous edema and scattered areas of subcutaneous calcification suggestive of chronic venous stasis. No soft tissue gas. IMPRESSION: 1. No acute fracture or dislocation. 2. Diffuse subcutaneous edema.  No soft tissue gas. Electronically Signed   By: Vanetta Chou M.D.   On: 08/30/2024 18:52    EKG: My personal interpretation of EKG shows: pending    Assessment/Plan Principal Problem:   Complicated wound infection Active Problems:  Complete heart block (HCC)   Type 2 diabetes mellitus with end-stage renal disease (HCC)   HFrEF (heart failure with reduced ejection fraction) (HCC)   PAD (peripheral artery disease)   Calciphylaxis   Asthma  Patient with acute on chronic wound infection given increased redness and drainage from her chronic wounds getting IV antibiotics.  Blood cultures and wound cultures ordered will follow-up on these.  Wound care consulted.  Given plain radiography concern for osteomyelitis will get MRI.  May need to consult vascular surgery versus orthopedic surgery depending on the findings.   Type 2 diabetes: Hold home regimen, continue SSI.  ESRD on HD: No acute HD needs.  Will consult nephrology to have patient receive in-house HD.  Continue home cefdinir and renal vitamins.  Asthma: Continue albuterol  as needed.  Complete heart block: Patient status post pacemaker.  May have to check if this is compatible with MRI.  It is Medtronic Micra AV.  CHF: Last EF was 45-50%.  She has significant diastolic dysfunction.  Volume  status managed by hemodialysis.  VTE prophylaxis:  SQ Heparin   Diet: Renal diet Code Status:  Full Code Telemetry:  Admission status: Observation, Telemetry bed Patient is from: Home Anticipated d/c is to: Home Anticipated d/c is in: 1-2 days   Family Communication: Updated at bedside  Consults called: Vascular surgery   Severity of Illness: The appropriate patient status for this patient is OBSERVATION. Observation status is judged to be reasonable and necessary in order to provide the required intensity of service to ensure the patient's safety. The patient's presenting symptoms, physical exam findings, and initial radiographic and laboratory data in the context of their medical condition is felt to place them at decreased risk for further clinical deterioration. Furthermore, it is anticipated that the patient will be medically stable for discharge from the [name] within 2 midnights of admission.    Morene Bathe, MD [name]      [1]  Allergies Allergen Reactions   Enalapril Hives and Other (See Comments)    Angioedema face.   Ivp Dye [Iodinated Contrast Media] Hives   Lisinopril Shortness Of Breath   Prednisone  Shortness Of Breath   "

## 2024-08-30 NOTE — ED Triage Notes (Signed)
 Pt presents to the ED via POV from home with bilateral leg wounds. Pt was sent here by the wound clinic for worsening of the wounds and IV antibiotics. Wound clinic sent off cultures of the wound. Pt A&Ox4. VSS.

## 2024-08-30 NOTE — Consult Note (Signed)
 ED Pharmacy Antibiotic Sign Off An antibiotic consult was received from an ED provider for Vancomycin  and Cefepime  per pharmacy dosing for wound infection. A chart review was completed to assess appropriateness.   The following one time order(s) were placed:  Vancomycin  1750mg  IV and Cefepime  2g IV.  Further antibiotic and/or antibiotic pharmacy consults should be ordered by the admitting provider if indicated.   Thank you for allowing pharmacy to be a part of this patient's care.   Yvetta Drotar A Atalya Dano, [facility]  Clinical Pharmacist 08/30/2024 4:41 PM

## 2024-08-31 ENCOUNTER — Inpatient Hospital Stay

## 2024-08-31 ENCOUNTER — Observation Stay

## 2024-08-31 DIAGNOSIS — N186 End stage renal disease: Secondary | ICD-10-CM | POA: Diagnosis present

## 2024-08-31 DIAGNOSIS — Z992 Dependence on renal dialysis: Secondary | ICD-10-CM | POA: Diagnosis not present

## 2024-08-31 DIAGNOSIS — I442 Atrioventricular block, complete: Secondary | ICD-10-CM | POA: Diagnosis present

## 2024-08-31 DIAGNOSIS — E872 Acidosis, unspecified: Secondary | ICD-10-CM | POA: Diagnosis present

## 2024-08-31 DIAGNOSIS — L03115 Cellulitis of right lower limb: Secondary | ICD-10-CM | POA: Diagnosis present

## 2024-08-31 DIAGNOSIS — L97229 Non-pressure chronic ulcer of left calf with unspecified severity: Secondary | ICD-10-CM | POA: Diagnosis present

## 2024-08-31 DIAGNOSIS — I5032 Chronic diastolic (congestive) heart failure: Secondary | ICD-10-CM | POA: Diagnosis present

## 2024-08-31 DIAGNOSIS — L089 Local infection of the skin and subcutaneous tissue, unspecified: Secondary | ICD-10-CM | POA: Diagnosis not present

## 2024-08-31 DIAGNOSIS — E785 Hyperlipidemia, unspecified: Secondary | ICD-10-CM | POA: Diagnosis present

## 2024-08-31 DIAGNOSIS — Z888 Allergy status to other drugs, medicaments and biological substances status: Secondary | ICD-10-CM | POA: Diagnosis not present

## 2024-08-31 DIAGNOSIS — Z8249 Family history of ischemic heart disease and other diseases of the circulatory system: Secondary | ICD-10-CM | POA: Diagnosis not present

## 2024-08-31 DIAGNOSIS — D631 Anemia in chronic kidney disease: Secondary | ICD-10-CM | POA: Diagnosis present

## 2024-08-31 DIAGNOSIS — K219 Gastro-esophageal reflux disease without esophagitis: Secondary | ICD-10-CM | POA: Diagnosis present

## 2024-08-31 DIAGNOSIS — Z794 Long term (current) use of insulin: Secondary | ICD-10-CM | POA: Diagnosis not present

## 2024-08-31 DIAGNOSIS — L97219 Non-pressure chronic ulcer of right calf with unspecified severity: Secondary | ICD-10-CM | POA: Diagnosis present

## 2024-08-31 DIAGNOSIS — Z833 Family history of diabetes mellitus: Secondary | ICD-10-CM | POA: Diagnosis not present

## 2024-08-31 DIAGNOSIS — E11621 Type 2 diabetes mellitus with foot ulcer: Secondary | ICD-10-CM | POA: Diagnosis present

## 2024-08-31 DIAGNOSIS — I132 Hypertensive heart and chronic kidney disease with heart failure and with stage 5 chronic kidney disease, or end stage renal disease: Secondary | ICD-10-CM | POA: Diagnosis present

## 2024-08-31 DIAGNOSIS — T148XXA Other injury of unspecified body region, initial encounter: Secondary | ICD-10-CM | POA: Diagnosis not present

## 2024-08-31 DIAGNOSIS — E1165 Type 2 diabetes mellitus with hyperglycemia: Secondary | ICD-10-CM | POA: Diagnosis present

## 2024-08-31 DIAGNOSIS — Z79899 Other long term (current) drug therapy: Secondary | ICD-10-CM | POA: Diagnosis not present

## 2024-08-31 DIAGNOSIS — L03116 Cellulitis of left lower limb: Secondary | ICD-10-CM | POA: Diagnosis present

## 2024-08-31 DIAGNOSIS — N2581 Secondary hyperparathyroidism of renal origin: Secondary | ICD-10-CM | POA: Diagnosis present

## 2024-08-31 DIAGNOSIS — E1122 Type 2 diabetes mellitus with diabetic chronic kidney disease: Secondary | ICD-10-CM | POA: Diagnosis present

## 2024-08-31 DIAGNOSIS — E1151 Type 2 diabetes mellitus with diabetic peripheral angiopathy without gangrene: Secondary | ICD-10-CM | POA: Diagnosis present

## 2024-08-31 LAB — CBC
HCT: 34.5 % — ABNORMAL LOW (ref 36.0–46.0)
Hemoglobin: 10.5 g/dL — ABNORMAL LOW (ref 12.0–15.0)
MCH: 31 pg (ref 26.0–34.0)
MCHC: 30.4 g/dL (ref 30.0–36.0)
MCV: 101.8 fL — ABNORMAL HIGH (ref 80.0–100.0)
Platelets: 257 K/uL (ref 150–400)
RBC: 3.39 MIL/uL — ABNORMAL LOW (ref 3.87–5.11)
RDW: 17.1 % — ABNORMAL HIGH (ref 11.5–15.5)
WBC: 8.5 K/uL (ref 4.0–10.5)
nRBC: 0 % (ref 0.0–0.2)

## 2024-08-31 LAB — GLUCOSE, CAPILLARY
Glucose-Capillary: 119 mg/dL — ABNORMAL HIGH (ref 70–99)
Glucose-Capillary: 143 mg/dL — ABNORMAL HIGH (ref 70–99)
Glucose-Capillary: 163 mg/dL — ABNORMAL HIGH (ref 70–99)

## 2024-08-31 LAB — BASIC METABOLIC PANEL WITH GFR
Anion gap: 27 — ABNORMAL HIGH (ref 5–15)
BUN: 17 mg/dL (ref 6–20)
CO2: 19 mmol/L — ABNORMAL LOW (ref 22–32)
Calcium: 9.8 mg/dL (ref 8.9–10.3)
Chloride: 93 mmol/L — ABNORMAL LOW (ref 98–111)
Creatinine, Ser: 3.16 mg/dL — ABNORMAL HIGH (ref 0.44–1.00)
GFR, Estimated: 16 mL/min — ABNORMAL LOW
Glucose, Bld: 186 mg/dL — ABNORMAL HIGH (ref 70–99)
Potassium: 3.9 mmol/L (ref 3.5–5.1)
Sodium: 139 mmol/L (ref 135–145)

## 2024-08-31 LAB — HEPATITIS B SURFACE ANTIGEN: Hepatitis B Surface Ag: NONREACTIVE

## 2024-08-31 LAB — LACTIC ACID, PLASMA
Lactic Acid, Venous: 2.9 mmol/L (ref 0.5–1.9)
Lactic Acid, Venous: 2.2 mmol/L (ref 0.5–1.9)

## 2024-08-31 LAB — C-REACTIVE PROTEIN: CRP: 12.3 mg/dL — ABNORMAL HIGH

## 2024-08-31 MED ORDER — ALBUTEROL SULFATE (2.5 MG/3ML) 0.083% IN NEBU: 2.5000 mg | INHALATION_SOLUTION | Freq: Four times a day (QID) | RESPIRATORY_TRACT | Status: DC | PRN

## 2024-08-31 MED ORDER — SODIUM CHLORIDE 0.9 % IV SOLN: 1.0000 g | INTRAVENOUS | Status: DC

## 2024-08-31 MED ORDER — OXYCODONE HCL 5 MG PO TABS
5.0000 mg | ORAL_TABLET | Freq: Four times a day (QID) | ORAL | Status: AC | PRN
  Administered 2024-08-31 (×3): 10 mg via ORAL
  Filled 2024-08-31 (×3): qty 2

## 2024-08-31 MED ORDER — VANCOMYCIN HCL 1250 MG/250ML IV SOLN: 1250.0000 mg | INTRAVENOUS | Status: DC

## 2024-08-31 MED ORDER — SODIUM CHLORIDE 0.9 % IV BOLUS
500.0000 mL | Freq: Once | INTRAVENOUS | Status: AC
  Administered 2024-08-31: 500 mL via INTRAVENOUS

## 2024-08-31 MED ORDER — SODIUM CHLORIDE 0.9 % IV SOLN: 2.0000 g | INTRAVENOUS | Status: DC

## 2024-08-31 MED ORDER — VANCOMYCIN HCL IN DEXTROSE 1-5 GM/200ML-% IV SOLN: 1000.0000 mg | INTRAVENOUS | Status: DC

## 2024-08-31 MED ORDER — PENTOXIFYLLINE ER 400 MG PO TBCR
400.0000 mg | EXTENDED_RELEASE_TABLET | Freq: Every day | ORAL | Status: DC
  Administered 2024-08-31 – 2024-09-02 (×3): 400 mg via ORAL
  Filled 2024-08-31 (×3): qty 1

## 2024-08-31 MED ORDER — CHLORHEXIDINE GLUCONATE CLOTH 2 % EX PADS
6.0000 | MEDICATED_PAD | Freq: Every day | CUTANEOUS | Status: DC
  Administered 2024-09-01 – 2024-09-02 (×2): 6 via TOPICAL

## 2024-08-31 MED ORDER — OXYCODONE HCL 5 MG PO TABS
5.0000 mg | ORAL_TABLET | Freq: Two times a day (BID) | ORAL | Status: DC | PRN
  Administered 2024-08-31 – 2024-09-01 (×3): 5 mg via ORAL
  Filled 2024-08-31 (×3): qty 1

## 2024-08-31 MED ORDER — SODIUM THIOSULFATE 250 MG/ML IV SOLN
25.0000 g | INTRAVENOUS | Status: DC
  Administered 2024-09-01: 25 g via INTRAVENOUS
  Filled 2024-08-31: qty 100

## 2024-08-31 NOTE — Progress Notes (Signed)
" °  Device system confirmed to be MRI conditional, with implant date > 6 weeks ago, and no evidence of abandoned or epicardial leads in review of most recent CXR  Device last cleared by EP Provider: Ozell Passey 08/31/2024  (Name and date)  Clearance is good through for 1 year as long as parameters remain stable at time of check. If pt undergoes a cardiac device procedure during that time, they should be re-cleared.   Tachy-therapies to be programmed off if applicable with device back to pre-MRI settings after completion of exam.  [employer] - Programming recommendation received through [employer] App/Tablet  [name]  08/31/2024 7:35 AM     "

## 2024-08-31 NOTE — Plan of Care (Signed)

## 2024-08-31 NOTE — Consult Note (Signed)
 "                                   [facility] [place]  Patient name: [name] MRN: [id number] DOB: Oct 17, 1965 Sex: female   REQUESTING PROVIDER:   Hospitalist    REASON FOR CONSULT:    wounds, bilateral lower extremity  HISTORY OF PRESENT ILLNESS:   [name] is a 59 y.o. female, who was admitted on 08/30/2024 with worsening bilateral lower extremity wounds.  The patient suffers from type 2 diabetes and is a dialysis patient.  She has been diagnosed with calciphylaxis bilaterally and has been undergoing outpatient wound care she was found to have a leukocytosis.  X-rays show chronic osteomyelitis.  She was admitted for IV antibiotics.  PAST MEDICAL HISTORY    Past Medical History:  Diagnosis Date   Allergy    Anemia    vitamin d3 deficiency   Anxiety    Asthma    Calciphylaxis    Chronic kidney disease    End Stage Renal Disease   Diabetes mellitus without complication (HCC)    GERD (gastroesophageal reflux disease)    nothing over last few years   Heart murmur    High serum parathyroid hormone (PTH)    checked through Dialysis   History of kidney stones 2000   Hypertension    Neuromuscular disorder (HCC)    neuropathy in feet   Osteoporosis    PONV (postoperative nausea and vomiting)    severe nausea requiring many doses of post op antiemetics     FAMILY HISTORY   Family History  Problem Relation Age of Onset   Emphysema Mother    COPD Mother    Cancer Father    Cancer Paternal Aunt    Diabetes Sister    Hypertension Sister    Eczema Sister    Diabetes Brother    Hypertension Brother    Cardiomyopathy Brother    Alcohol abuse Brother     SOCIAL HISTORY:   Social History   Socioeconomic History   Marital status: Married    Spouse name: Redell   Number of children: 0   Years of education: Not on file   Highest education level: Some college, no degree  Occupational History   Occupation: Scientist, [employer]   Tobacco Use   Smoking status: Never   Smokeless tobacco: Never  Vaping Use   Vaping status: Never Used  Substance and Sexual Activity   Alcohol use: Never   Drug use: Never   Sexual activity: Not Currently    Partners: Male    Birth control/protection: None  Other Topics Concern   Not on file  Social History Narrative   Not on file   Social Drivers of Health   Tobacco Use: Low Risk (08/30/2024)   Patient History    Smoking Tobacco Use: Never    Smokeless Tobacco Use: Never    Passive Exposure: Not on file  Financial Resource Strain: Low Risk (07/25/2024)   Overall Financial Resource Strain (CARDIA)    Difficulty of Paying Living Expenses: Not hard at all  Food Insecurity: No Food Insecurity (08/30/2024)   Epic    Worried About Radiation Protection Practitioner of Food in the Last Year: Never true    Ran Out of Food in the Last Year: Never true  Transportation Needs: No Transportation Needs (08/30/2024)   Epic    Lack of Transportation (  Medical): No    Lack of Transportation (Non-Medical): No  Physical Activity: Inactive (07/25/2024)   Exercise Vital Sign    Days of Exercise per Week: 0 days    Minutes of Exercise per Session: Not on file  Stress: Not on file  Social Connections: Socially Integrated (07/25/2024)   Social Connection and Isolation Panel    Frequency of Communication with Friends and Family: More than three times a week    Frequency of Social Gatherings with Friends and Family: Three times a week    Attends Religious Services: More than 4 times per year    Active Member of Clubs or Organizations: Yes    Attends Banker Meetings: More than 4 times per year    Marital Status: Married  Recent Concern: Social Connections - Moderately Isolated (05/01/2024)   Social Connection and Isolation Panel    Frequency of Communication with Friends and Family: More than three times a week    Frequency of Social Gatherings with Friends and Family: Twice a week    Attends  Religious Services: Never    Database Administrator or Organizations: No    Attends Banker Meetings: Never    Marital Status: Married  Catering Manager Violence: Not At Risk (08/30/2024)   Epic    Fear of Current or Ex-Partner: No    Emotionally Abused: No    Physically Abused: No    Sexually Abused: No  Depression (PHQ2-9): Low Risk (08/10/2024)   Depression (PHQ2-9)    PHQ-2 Score: 0  Recent Concern: Depression (PHQ2-9) - Medium Risk (07/03/2024)   Depression (PHQ2-9)    PHQ-2 Score: 6  Alcohol Screen: Not on file  Housing: Low Risk (08/30/2024)   Epic    Unable to Pay for Housing in the Last Year: No    Number of Times Moved in the Last Year: 0    Homeless in the Last Year: No  Utilities: Not At Risk (08/30/2024)   Epic    Threatened with loss of utilities: No  Health Literacy: Not on file    ALLERGIES:    Allergies[1]  CURRENT MEDICATIONS:    Current Facility-Administered Medications  Medication Dose Route Frequency Provider Last Rate Last Admin   acetaminophen  (TYLENOL ) tablet 650 mg  650 mg Oral Q6H PRN Khan, Ghalib, MD   650 mg at 08/30/24 2242   Or   acetaminophen  (TYLENOL ) suppository 650 mg  650 mg Rectal Q6H PRN Fernand Prost, MD       [START ON 09/01/2024] ceFEPIme  (MAXIPIME ) 1 g in sodium chloride  0.9 % 100 mL IVPB  1 g Intravenous Q24H Wouk, Devaughn Sayres, MD       Chlorhexidine  Gluconate Cloth 2 % PADS 6 each  6 each Topical Daily Fernand Prost, MD       heparin  injection 5,000 Units  5,000 Units Subcutaneous Q8H Fernand Prost, MD       HYDROmorphone  (DILAUDID ) injection 0.5 mg  0.5 mg Intravenous Q3H PRN Fernand Prost, MD   0.5 mg at 08/31/24 0335   insulin  aspart (novoLOG ) injection 0-5 Units  0-5 Units Subcutaneous QHS Fernand Prost, MD       insulin  aspart (novoLOG ) injection 0-9 Units  0-9 Units Subcutaneous TID WC Fernand Prost, MD   2 Units at 08/31/24 0934   ondansetron  (ZOFRAN ) tablet 4 mg  4 mg Oral Q6H PRN Fernand Prost, MD       Or    ondansetron  (ZOFRAN ) injection 4 mg  4 mg  Intravenous Q6H PRN Fernand Prost, MD       oxyCODONE  (Oxy IR/ROXICODONE ) immediate release tablet 5 mg  5 mg Oral Q12H PRN Fernand Prost, MD   5 mg at 08/31/24 0112   oxyCODONE  (Oxy IR/ROXICODONE ) immediate release tablet 5-10 mg  5-10 mg Oral Q6H PRN Donati-Garmon, Natalie M, NP   10 mg at 08/31/24 0933   senna-docusate (Senokot-S) tablet 1 tablet  1 tablet Oral QHS PRN Fernand Prost, MD       sodium chloride  flush (NS) 0.9 % injection 3 mL  3 mL Intravenous Q12H Fernand Prost, MD   3 mL at 08/31/24 0935   [START ON 09/01/2024] vancomycin  (VANCOCIN ) IVPB 1000 mg/200 mL premix  1,000 mg Intravenous Q M,W,F-HD Wouk, Devaughn Sayres, MD        REVIEW OF SYSTEMS:   [X]  denotes positive finding, [ ]  denotes negative finding Cardiac  Comments:  Chest pain or chest pressure:    Shortness of breath upon exertion:    Short of breath when lying flat:    Irregular heart rhythm:        Vascular    Pain in calf, thigh, or hip brought on by ambulation:    Pain in feet at night that wakes you up from your sleep:     Blood clot in your veins:    Leg swelling:         Pulmonary    Oxygen at home:    Productive cough:     Wheezing:         Neurologic    Sudden weakness in arms or legs:     Sudden numbness in arms or legs:     Sudden onset of difficulty speaking or slurred speech:    Temporary loss of vision in one eye:     Problems with dizziness:         Gastrointestinal    Blood in stool:      Vomited blood:         Genitourinary    Burning when urinating:     Blood in urine:        Psychiatric    Major depression:         Hematologic    Bleeding problems:    Problems with blood clotting too easily:        Skin    Rashes or ulcers:        Constitutional    Fever or chills:     PHYSICAL EXAM:   Vitals:   08/30/24 2213 08/31/24 0339 08/31/24 0339 08/31/24 0754  BP: 93/61  (!) 95/56 102/65  Pulse: 98  88 84  Resp: 18  20 20   Temp: 97.7  F (36.5 C)  97.6 F (36.4 C) 97.9 F (36.6 C)  TempSrc: Oral  Axillary   SpO2: 91%  92% 93%  Weight: 83.2 kg 83.2 kg    Height: 5' 7 (1.702 m)       GENERAL: The patient is a well-nourished female, in no acute distress. The vital signs are documented above. CARDIAC: There is a regular rate and rhythm.  PULMONARY: Nonlabored respirations  MUSCULOSKELETAL: There are no major deformities or cyanosis. NEUROLOGIC: No focal weakness or paresthesias are detected. SKIN: There are no ulcers or rashes noted. PSYCHIATRIC: The patient has a normal affect.       STUDIES:   I have reviewed the following CT scan: VASCULAR   Scattered atherosclerotic disease is noted described. Two vessel runoff to  the right ankle is noted with three-vessel runoff to the left ankle. No arterial abnormality to correspond with the physical exam is identified.   NON-VASCULAR   Changes consistent with end-stage renal failure with renal atrophy and bony changes.   Diverticulosis without diverticulitis.   Fatty infiltration of the liver.  ASSESSMENT and PLAN   Bilateral lower extremity calciphylaxis: The patient has a CT scan that shows two-vessel runoff on the right and 3 vessels the left.  No obvious high-grade lesions are identified however with the calcification sometimes that is difficult to ascertain from CT scan.  I think the biggest issue right now is there underlying infection.  I would recommend continue her IV antibiotics and local wound care.  If she continues to have trouble, we could consider angiography to better evaluate her blood flow   Malvina New, IV, MD, FACS [facility] [phone] Pager [phone]     [1]  Allergies Allergen Reactions   Enalapril Hives and Other (See Comments)    Angioedema face.   Ivp Dye [Iodinated Contrast Media] Hives   Lisinopril Shortness Of Breath   Prednisone  Shortness Of Breath   "

## 2024-08-31 NOTE — Progress Notes (Signed)
 Pharmacy Antibiotic Note  [name] is a 59 y.o. female admitted on 08/30/2024 with wound infection.  Pharmacy has been consulted for Vancomycin , Cefepime  dosing.  Plan: Cefepime  2 gm IV X 1 given in ED on 12/31 @ 1719. Cefepime  2 gm IV Q24H ordered to start on 01/01 @ 1700.  Vancomycin  1750 mg IV X 1 given in ED on 12/31 @ 1813. Vancomycin  1250 mg IV Q48H ordered to start on 01/02 @ 1800.  AUC = 462.6 Vanc trough = 10.8   Height: 5' 7 (170.2 cm) Weight: 80 kg (176 lb 5.9 oz) IBW/kg (Calculated) : 61.6  Temp (24hrs), Avg:97.8 F (36.6 C), Min:97.7 F (36.5 C), Max:98 F (36.7 C)  Recent Labs  Lab 08/30/24 1348 08/30/24 1723 08/30/24 1926 08/30/24 2312  WBC 7.6  --   --   --   CREATININE 2.53*  --   --   --   LATICACIDVEN  --  2.1* 2.8* 3.1*    Estimated Creatinine Clearance: 25.8 mL/min (A) (by C-G formula based on SCr of 2.53 mg/dL (H)).    Allergies[1]  Antimicrobials this admission:   >>    >>   Dose adjustments this admission:   Microbiology results:  BCx:   UCx:    Sputum:    MRSA PCR:   Thank you for allowing pharmacy to be a part of this patients care.  Waymond Meador D 08/31/2024 12:41 AM     [1]  Allergies Allergen Reactions   Enalapril Hives and Other (See Comments)    Angioedema face.   Ivp Dye [Iodinated Contrast Media] Hives   Lisinopril Shortness Of Breath   Prednisone  Shortness Of Breath

## 2024-08-31 NOTE — Plan of Care (Signed)
" °  Problem: Coping: Goal: Ability to adjust to condition or change in health will improve Outcome: Progressing   Problem: Nutritional: Goal: Maintenance of adequate nutrition will improve Outcome: Progressing Goal: Progress toward achieving an optimal weight will improve Outcome: Progressing   Problem: Metabolic: Goal: Ability to maintain appropriate glucose levels will improve Outcome: Progressing   Problem: Skin Integrity: Goal: Risk for impaired skin integrity will decrease Outcome: Progressing   Problem: Education: Goal: Knowledge of General Education information will improve Description: Including pain rating scale, medication(s)/side effects and non-pharmacologic comfort measures Outcome: Progressing   Problem: Activity: Goal: Risk for activity intolerance will decrease Outcome: Progressing   Problem: Coping: Goal: Level of anxiety will decrease Outcome: Progressing   Problem: Pain Managment: Goal: General experience of comfort will improve and/or be controlled Outcome: Progressing   Problem: Safety: Goal: Ability to remain free from injury will improve Outcome: Progressing   Problem: Skin Integrity: Goal: Risk for impaired skin integrity will decrease Outcome: Progressing   "

## 2024-08-31 NOTE — Progress Notes (Signed)
 " PROGRESS NOTE    [name]  FMW:[id number] DOB: 1965/11/15 DOA: 08/30/2024 PCP: Franchot Isaiah LABOR, MD  Outpatient Specialists: wound care, nephrology, cardiology    Brief Narrative:   [name] is a 59 y.o. 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  Patient states she has been having worsening pain and redness on her bilateral lower extremities despite taking her pain medication. On arrival to the ED patient was noted to be HDS stable.  Lab work obtained.  CBC without leukocytosis, mild anemia that is borderline macrocytic.  CMP with ESRD changes along with moderate hyperglycemia, ALP elevation, and chronic anion gap metabolic acidosis.  ESR checked and elevated at 63.  Blood cultures ordered, wound cultures ordered.  Wound cultures were obtained by wound center.  Plain radiography of the lower extremities shows right x-ray showing changes consistent with chronic osteomyelitis along with diffuse soft tissue edema with left x-ray showing diffuse subcutaneous edema but no gas formation.  Given patient presentation and wound care recommendations of IV antibiotics, [facility] contacted for admission.    Assessment & Plan:   Principal Problem:   Complicated wound infection Active Problems:   ESRD on dialysis (HCC)   Complete heart block (HCC)   Type 2 diabetes mellitus with end-stage renal disease (HCC)   HFrEF (heart failure with reduced ejection fraction) (HCC)   PAD (peripheral artery disease)   Calciphylaxis   Asthma   # Calciphylaxis # Cellulitis With x ray showing possible osteo on the right. Inflammatory markers are elevated - continue vanc, add cefepime  given osteo concern - attempt mri today (has pacemaker) - follow superficial culture and blood cultures - nephrology to see - wound care nurse recs are  in  # PAD - vascular following, not advising additional vascular w/u for the time being - cont home pentoxifylline   # ESRD [facility] hemodialysis - nephro consult  # T1DM - ssi  # CHB S/p ppm  # HFrecoveredEF Euvolemic - volume w/ dialysis    DVT prophylaxis: heparin  Code Status: full Family Communication: none at bedside  Level of care: Med-Surg Status is: Observation     Consultants:  Vascular nephrology  Procedures: Hemodialysis pending  Antimicrobials:  Vanc/cefepime     Subjective: Reports stable leg pain, no fevers  Objective: Vitals:   08/30/24 2213 08/31/24 0339 08/31/24 0339 08/31/24 0754  BP: 93/61  (!) 95/56 102/65  Pulse: 98  88 84  Resp: 18  20 20   Temp: 97.7 F (36.5 C)  97.6 F (36.4 C) 97.9 F (36.6 C)  TempSrc: Oral  Axillary   SpO2: 91%  92% 93%  Weight: 83.2 kg 83.2 kg    Height: 5' 7 (1.702 m)       Intake/Output Summary (Last 24 hours) at 08/31/2024 1126 Last data filed at 08/31/2024 0753 Gross per 24 hour  Intake 449.85 ml  Output --  Net 449.85 ml   Filed Weights   08/30/24 1329 08/30/24 2213 08/31/24 0339  Weight: 80 kg 83.2 kg 83.2 kg    Examination:  General exam: Appears calm and comfortable  Respiratory system: Clear to auscultation. Respiratory effort normal. Cardiovascular system: S1 & S2 heard, RRR. Soft systolic murmur Gastrointestinal system: Abdomen is nondistended, soft and nontender.   Central nervous system: Alert and oriented. No focal neurological deficits. Extremities: Symmetric 5 x 5 power. Skin: L  Psychiatry: Judgement and insight appear normal. Mood & affect appropriate.     Data Reviewed: I have personally reviewed following labs and imaging studies  CBC: Recent Labs  Lab 08/30/24 1348 08/31/24 0540  WBC 7.6 8.5  NEUTROABS 6.5  --   HGB 11.7* 10.5*  HCT 38.7 34.5*  MCV 100.8* 101.8*  PLT 292 257   Basic Metabolic Panel: Recent Labs  Lab 08/30/24 1348 08/31/24 0540  NA 141  139  K 3.7 3.9  CL 92* 93*  CO2 21* 19*  GLUCOSE 150* 186*  BUN 12 17  CREATININE 2.53* 3.16*  CALCIUM  9.9 9.8  MG 2.0  --    GFR: Estimated Creatinine Clearance: 21 mL/min (A) (by C-G formula based on SCr of 3.16 mg/dL (H)). Liver Function Tests: Recent Labs  Lab 08/30/24 1348  AST 15  ALT <5  ALKPHOS 230*  BILITOT 0.6  PROT 7.0  ALBUMIN 3.9   No results for input(s): LIPASE, AMYLASE in the last 168 hours. No results for input(s): AMMONIA in the last 168 hours. Coagulation Profile: No results for input(s): INR, PROTIME in the last 168 hours. Cardiac Enzymes: No results for input(s): CKTOTAL, CKMB, CKMBINDEX, TROPONINI in the last 168 hours. BNP (last 3 results) No results for input(s): PROBNP in the last 8760 hours. HbA1C: No results for input(s): HGBA1C in the last 72 hours. CBG: Recent Labs  Lab 08/30/24 2204  GLUCAP 184*   Lipid Profile: No results for input(s): CHOL, HDL, LDLCALC, TRIG, CHOLHDL, LDLDIRECT in the last 72 hours. Thyroid  Function Tests: No results for input(s): TSH, T4TOTAL, FREET4, T3FREE, THYROIDAB in the last 72 hours. Anemia Panel: No results for input(s): VITAMINB12, FOLATE, FERRITIN, TIBC, IRON , RETICCTPCT in the last 72 hours. Urine analysis: No results found for: COLORURINE, APPEARANCEUR, LABSPEC, PHURINE, GLUCOSEU, HGBUR, BILIRUBINUR, KETONESUR, PROTEINUR, UROBILINOGEN, NITRITE, LEUKOCYTESUR Sepsis Labs: @LABRCNTIP (procalcitonin:4,lacticidven:4)  ) Recent Results (from the past 240 hours)  Blood Culture (routine x 2)     Status: None (Preliminary result)   Collection Time: 08/30/24  5:21 PM   Specimen: BLOOD  Result Value Ref Range Status   Specimen Description BLOOD BLOOD RIGHT HAND  Final   Special Requests   Final    BOTTLES DRAWN AEROBIC AND ANAEROBIC Blood Culture results may not be optimal due to an inadequate volume of blood received in culture  bottles   Culture   Final    NO GROWTH < 24 HOURS Performed at [facility], [address] [place], [place] [address]    Report Status PENDING  Incomplete  Blood Culture (routine x 2)     Status: None (Preliminary result)   Collection Time: 08/30/24  5:25 PM   Specimen: BLOOD  Result Value Ref Range Status   Specimen Description BLOOD RIGHT ANTECUBITAL  Final   Special Requests   Final    BOTTLES DRAWN AEROBIC AND ANAEROBIC Blood Culture adequate volume   Culture   Final    NO GROWTH < 24 HOURS Performed at [facility], [address], [place], [place] [address]    Report Status PENDING  Incomplete         Radiology Studies: DG Chest Port 1 View Result Date: 08/31/2024 CLINICAL DATA:  Pacemaker. Technologist notes state complicated wound infection. EXAM: PORTABLE CHEST 1 VIEW COMPARISON:  Radiographs 03/22/2024 FINDINGS: Lead less pacemaker projects over the left mediastinum. Right-sided dialysis catheter tip in the right atrium. Overall low lung volumes. Cardiomegaly is stable. Aortic atherosclerosis. No pulmonary edema. No confluent  airspace disease. No pleural fluid or pneumothorax. Grossly stable osseous structures. IMPRESSION: 1. Lead less pacemaker projects over the left mediastinum. 2. Stable cardiomegaly. Electronically Signed   By: Andrea Gasman M.D.   On: 08/31/2024 10:21   DG Tibia/Fibula Right Result Date: 08/30/2024 EXAM: 2 VIEW(S) XRAY OF THE RIGHT TIBIA AND FIBULA 08/30/2024 05:40:32 PM COMPARISON: None available. CLINICAL HISTORY: Complicated wound infection. FINDINGS: BONES AND JOINTS: There is some periosteal reaction along the mid and proximal diaphysis of the fibula. No cortical erosions are identified. No acute fracture. No malalignment. SOFT TISSUES: Diffuse soft tissue edema. Vascular calcifications and scattered phleboliths noted. IMPRESSION: 1. Periosteal reaction along the mid and proximal diaphysis of the fibula, with no cortical  erosions identified. Findings can be seen in the setting of chronic osteomyelitis . 2. Diffuse soft tissue edema. Electronically signed by: Greig Pique MD 08/30/2024 06:55 PM EST RP Workstation: [id number]   DG Tibia/Fibula Left Result Date: 08/30/2024 CLINICAL DATA:  Wound infection. EXAM: DG TIBIA/FIBULA 2V*L* COMPARISON:  Radiograph dated 03/22/2024. FINDINGS: No acute fracture or dislocation. The bones are osteopenic. There is diffuse subcutaneous edema and scattered areas of subcutaneous calcification suggestive of chronic venous stasis. No soft tissue gas. IMPRESSION: 1. No acute fracture or dislocation. 2. Diffuse subcutaneous edema.  No soft tissue gas. Electronically Signed   By: Vanetta Chou M.D.   On: 08/30/2024 18:52        Scheduled Meds:  Chlorhexidine  Gluconate Cloth  6 each Topical Daily   heparin   5,000 Units Subcutaneous Q8H   insulin  aspart  0-5 Units Subcutaneous QHS   insulin  aspart  0-9 Units Subcutaneous TID WC   sodium chloride  flush  3 mL Intravenous Q12H   Continuous Infusions:  [START ON 09/01/2024] ceFEPime  (MAXIPIME ) IV     [START ON 09/01/2024] vancomycin        LOS: 0 days     Devaughn KATHEE Ban, MD [facility]   If 7PM-7AM, please contact night-coverage [email or web address] Password [email or web address] 08/31/2024, 11:26 AM     "

## 2024-08-31 NOTE — Consult Note (Signed)
 WOC Nurse Consult Note: patient with suspected calciphylaxis followed [facility] last seen 12/31 with concerns for infection and sent to Emergency Room; has been using Sorbact to lower leg wounds due to large amount of drainage; Sorbact is not on formulary at [facility]  Reason for Consult: leg wounds  Wound type: full thickness B feet/lower legs ? R/t calciphylaxis  Pressure Injury POA: NA not pressure  Measurement: see nursing flowsheet  Wound bed: 1.  L lateral heel dry eschar, L great toe red dry; L dorsal 2nd digit brown necrotic tissue; dorsal L foot tan/yellow tissue; linear L upper dorsal foot red moist; R great toe dry brown; R 2nd digit tan necrotic tissue;  2.  B anterior/lateral/medial lower legs with full thickness wounds tan necrotic and black eschar  Drainage (amount, consistency, odor) large amounts foul smelling per wound care center notes  Periwound: edema and erythema  Dressing procedure/placement/frequency:  Cleanse all toe/foot wounds with Vashe, do not rinse.  Apply Xeroform gauze ([facility] [phone]) to wound beds daily, cover with dry gauze/ABD pads and secure with Kerlix roll gauze.  Change dressing daily and as needed for drainage saturation.  Cleanse B lower leg wounds with Vashe, do not rinse.  Apply calcium  alginate ([facility] #[id number]) to wound beds daily, cover with ABD pads and secure with Kerlix roll gauze beginning right above toes and ending below knees.  May apply Ace bandage for light compression if desired.  Change dressings daily and as needed for dressing saturation.    POC discussed with bedside nurse.  Patient has questionable osteomyelitis R leg; pending consult to vascular surgeon and/or orthopedics. Any wound care orders placed by surgeon supercede wound care orders placed by [facility] RN.   WOC team will not follow. Reconsult if further needs arise.   Thank you,    Powell Bar MSN, RN-BC, [employer]

## 2024-08-31 NOTE — Progress Notes (Signed)
 Pharmacy Antibiotic Note  [name] is a 59 y.o. female admitted on 08/30/2024 with wound infection.  Pharmacy has been consulted for Vancomycin , Cefepime  dosing. Pt is on HD MWF.   Plan: Will adjust cefepime  from 2 g daily to 1 g daily. Starting post dialysis on Friday. Pt got cefepime  2 g given on 12/31.   Pt received vancomycin  loading dose of 1750 mg x 1. Will start vancomycin  1000 mg on HD days.   Height: 5' 7 (170.2 cm) Weight: 83.2 kg (183 lb 6.8 oz) IBW/kg (Calculated) : 61.6  Temp (24hrs), Avg:97.8 F (36.6 C), Min:97.6 F (36.4 C), Max:98 F (36.7 C)  Recent Labs  Lab 08/30/24 1348 08/30/24 1723 08/30/24 1926 08/30/24 2312 08/31/24 0150 08/31/24 0540  WBC 7.6  --   --   --   --  8.5  CREATININE 2.53*  --   --   --   --  3.16*  LATICACIDVEN  --  2.1* 2.8* 3.1* 2.9*  --     Estimated Creatinine Clearance: 21 mL/min (A) (by C-G formula based on SCr of 3.16 mg/dL (H)).    Allergies[1]   Thank you for allowing pharmacy to be a part of this patients care.  Cathaleen GORMAN Blanch, PharmD, BCPS 08/31/2024 8:47 AM      [1]  Allergies Allergen Reactions   Enalapril Hives and Other (See Comments)    Angioedema face.   Ivp Dye [Iodinated Contrast Media] Hives   Lisinopril Shortness Of Breath   Prednisone  Shortness Of Breath

## 2024-08-31 NOTE — Evaluation (Signed)
 Physical Therapy Evaluation Patient Details Name: [name] MRN: [id number] DOB: 10/27/1965 Today's Date: 08/31/2024  History of Present Illness  [name] is a 59 y.o. 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  Patient states she has been having worsening pain and redness on her bilateral lower extremities despite taking her pain medication.   Clinical Impression  Patient received in bed, she is pleasant and agrees to PT assessment. Patient reports she has not been ambulating due to wounds. Has been using wheelchair. Patient demonstrates the need for cga to move legs off bed due to pain. She is able to stand and pivot to wheelchair with supervision. Patient feels she is at her baseline and needs no further PT at this time. Will sign off.          If plan is discharge home, recommend the following: A little help with bathing/dressing/bathroom;Assistance with cooking/housework;Assist for transportation;Help with stairs or ramp for entrance   Can travel by private vehicle    yes    Equipment Recommendations None recommended by PT  Recommendations for Other Services       Functional Status Assessment Patient has not had a recent decline in their functional status     Precautions / Restrictions Precautions Precautions: None Recall of Precautions/Restrictions: Intact Restrictions Weight Bearing Restrictions Per Provider Order: No Other Position/Activity Restrictions: patient has limited weight bearing on B LEs due to pain and wounds      Mobility  Bed Mobility Overal bed mobility: Needs Assistance Bed Mobility: Supine to Sit     Supine to sit: Contact guard     General bed mobility comments: requires a little assist to move legs off bed due to pain    Transfers Overall transfer level: Needs  assistance Equipment used: None Transfers: Bed to chair/wheelchair/BSC       Squat pivot transfers: Supervision          Ambulation/Gait               General Gait Details: not ambulating currently due to wounds  Stairs            Wheelchair Mobility     Tilt Bed    Modified Rankin (Stroke Patients Only)       Balance Overall balance assessment: Modified Independent                                           Pertinent Vitals/Pain Pain Assessment Pain Assessment: Faces Faces Pain Scale: Hurts little more Pain Location: B Legs Pain Descriptors / Indicators: Discomfort, Grimacing Pain Intervention(s): Monitored during session, Repositioned    Home Living Family/patient expects to be discharged to:: Private residence Living Arrangements: Spouse/significant other Available Help at Discharge: Family;Friend(s);Available 24 hours/day Type of Home: House Home Access: Ramped entrance       Home Layout: One level Home Equipment: Crutches;Wheelchair - manual;BSC/3in1;Rollator (4 wheels);Rolling Walker (2 wheels);Electric scooter      Prior Function Prior Level of Function : Independent/Modified Independent             Mobility Comments: Has been limited ambulating due to wounds on legs. using wheelchair at baseline       Extremity/Trunk Assessment   Upper Extremity Assessment Upper Extremity Assessment:  Defer to OT evaluation    Lower Extremity Assessment Lower Extremity Assessment: Overall WFL for tasks assessed    Cervical / Trunk Assessment Cervical / Trunk Assessment: Normal  Communication   Communication Communication: No apparent difficulties    Cognition Arousal: Alert Behavior During Therapy: WFL for tasks assessed/performed   PT - Cognitive impairments: No apparent impairments                         Following commands: Intact       Cueing Cueing Techniques: Verbal cues     General  Comments      Exercises     Assessment/Plan    PT Assessment Patient does not need any further PT services  PT Problem List         PT Treatment Interventions      PT Goals (Current goals can be found in the Care Plan section)  Acute Rehab PT Goals Patient Stated Goal: return home PT Goal Formulation: With patient Time For Goal Achievement: 09/07/24 Potential to Achieve Goals: Good    Frequency       Co-evaluation               AM-PAC PT 6 Clicks Mobility  Outcome Measure Help needed turning from your back to your side while in a flat bed without using bedrails?: A Little Help needed moving from lying on your back to sitting on the side of a flat bed without using bedrails?: A Little Help needed moving to and from a bed to a chair (including a wheelchair)?: None Help needed standing up from a chair using your arms (e.g., wheelchair or bedside chair)?: None Help needed to walk in hospital room?: Total Help needed climbing 3-5 steps with a railing? : Total 6 Click Score: 16    End of Session   Activity Tolerance: Patient tolerated treatment well Patient left: in chair Nurse Communication: Mobility status      Time: 9047-8996 PT Time Calculation (min) (ACUTE ONLY): 11 min   Charges:   PT Evaluation $PT Eval Low Complexity: 1 Low   PT General Charges $$ ACUTE PT VISIT: 1 Visit         Britaney Espaillat, PT, GCS 08/31/2024,10:44 AM

## 2024-08-31 NOTE — Evaluation (Signed)
 Occupational Therapy Evaluation Patient Details Name: [name] MRN: [id number] DOB: [birth year] Today's Date: 08/31/2024   History of Present Illness   [name] is a 59 y.o. 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  Patient states she has been having worsening pain and redness on her bilateral lower extremities despite taking her pain medication.     Clinical Impressions Upon entering the room, pt supine in bed and agreeable to OT evaluation. Pt endorses living at home with husband and being wheelchair level secondary to having limited weight bearing through B LEs. Pt's spouse is available to assist with self care and IADLs as needed but pt endorses doing mostly on her own at baseline. OT sets up equipment and pt needing min A for supine >sit for assist with B LEs secondary to pain. Pt performed squat pivot transfer from bed >wheelchair with supervision. Pt endorses being at baseline for self care needs and functional transfers at this time. OT to complete orders. Call bell and all needed items within reach. Breakfast tray placed in front of her to eat.       Functional Status Assessment   Patient has not had a recent decline in their functional status     Equipment Recommendations   None recommended by OT      Precautions/Restrictions   Precautions Precautions: None Recall of Precautions/Restrictions: Intact Restrictions Weight Bearing Restrictions Per Provider Order: No Other Position/Activity Restrictions: patient has limited weight bearing on B LEs due to pain and wounds     Mobility Bed Mobility Overal bed mobility: Needs Assistance Bed Mobility: Supine to Sit     Supine to sit: Min assist     General bed mobility comments: requires a little assist to move legs off bed due to pain     Transfers Overall transfer level: Needs assistance Equipment used: None Transfers: Bed to chair/wheelchair/BSC     Squat pivot transfers: Supervision              Balance Overall balance assessment: Modified Independent                                         ADL either performed or assessed with clinical judgement   ADL Overall ADL's : Needs assistance/impaired                         Toilet Transfer: Radiographer, Therapeutic Details (indicate cue type and reason): simulated transfer from bed >wheelchair                 Vision Baseline Vision/History: 1 Wears glasses Patient Visual Report: No change from baseline              Pertinent Vitals/Pain Pain Assessment Pain Assessment: Faces Faces Pain Scale: Hurts little more Pain Location: B Legs Pain Descriptors / Indicators: Discomfort, Grimacing Pain Intervention(s): Monitored during session, Repositioned     Extremity/Trunk Assessment Upper Extremity Assessment Upper Extremity Assessment: Overall WFL for tasks assessed   Lower Extremity Assessment Lower Extremity Assessment: Overall WFL for tasks assessed   Cervical / Trunk Assessment Cervical / Trunk Assessment: Normal   Communication Communication Communication: No apparent difficulties   Cognition Arousal: Alert Behavior During Therapy: [facility] for  tasks assessed/performed Cognition: No apparent impairments                               Following commands: Intact       Cueing  General Comments   Cueing Techniques: Verbal cues              Home Living Family/patient expects to be discharged to:: Private residence Living Arrangements: Spouse/significant other Available Help at Discharge: Family;Friend(s);Available 24 hours/day Type of Home: House Home Access: Ramped entrance     Home Layout: One level     Bathroom Shower/Tub: Producer, Television/film/video: Handicapped  height Bathroom Accessibility: No   Home Equipment: Crutches;Wheelchair - manual;BSC/3in1;Rollator (4 wheels);Rolling Walker (2 wheels);Electric scooter          Prior Functioning/Environment Prior Level of Function : Independent/Modified Independent             Mobility Comments: Has been limited ambulating due to wounds on legs. using wheelchair at baseline ADLs Comments: IND/MOD I with ADL/IADL management            OT Goals(Current goals can be found in the care plan section)   Acute Rehab OT Goals Patient Stated Goal: to go home OT Goal Formulation: With patient Time For Goal Achievement: 08/31/24 Potential to Achieve Goals: Fair   AM-PAC OT 6 Clicks Daily Activity     Outcome Measure Help from another person eating meals?: None Help from another person taking care of personal grooming?: None Help from another person toileting, which includes using toliet, bedpan, or urinal?: None Help from another person bathing (including washing, rinsing, drying)?: A Little Help from another person to put on and taking off regular upper body clothing?: None Help from another person to put on and taking off regular lower body clothing?: A Little 6 Click Score: 22   End of Session Equipment Utilized During Treatment: Other (comment) (wheelchair) Nurse Communication: Mobility status  Activity Tolerance: Patient tolerated treatment well Patient left: with call bell/phone within reach;Other (comment) (seated in wheelchair eating breakfast)                   Time: 9047-8995 OT Time Calculation (min): 12 min Charges:  OT General Charges $OT Visit: 1 Visit OT Evaluation $OT Eval Low Complexity: 1 Low  Izetta Claude, MS, OTR/L , [facility]IS ascom [phone]  08/31/2024, 11:38 AM

## 2024-09-01 DIAGNOSIS — L089 Local infection of the skin and subcutaneous tissue, unspecified: Secondary | ICD-10-CM | POA: Diagnosis not present

## 2024-09-01 DIAGNOSIS — T148XXA Other injury of unspecified body region, initial encounter: Secondary | ICD-10-CM | POA: Diagnosis not present

## 2024-09-01 LAB — BASIC METABOLIC PANEL WITH GFR
Anion gap: 29 — ABNORMAL HIGH (ref 5–15)
BUN: 23 mg/dL — ABNORMAL HIGH (ref 6–20)
CO2: 16 mmol/L — ABNORMAL LOW (ref 22–32)
Calcium: 10.3 mg/dL (ref 8.9–10.3)
Chloride: 92 mmol/L — ABNORMAL LOW (ref 98–111)
Creatinine, Ser: 4.39 mg/dL — ABNORMAL HIGH (ref 0.44–1.00)
GFR, Estimated: 11 mL/min — ABNORMAL LOW
Glucose, Bld: 130 mg/dL — ABNORMAL HIGH (ref 70–99)
Potassium: 4.2 mmol/L (ref 3.5–5.1)
Sodium: 138 mmol/L (ref 135–145)

## 2024-09-01 LAB — GLUCOSE, CAPILLARY
Glucose-Capillary: 154 mg/dL — ABNORMAL HIGH (ref 70–99)
Glucose-Capillary: 138 mg/dL — ABNORMAL HIGH (ref 70–99)
Glucose-Capillary: 235 mg/dL — ABNORMAL HIGH (ref 70–99)
Glucose-Capillary: 220 mg/dL — ABNORMAL HIGH (ref 70–99)

## 2024-09-01 MED ORDER — CEFAZOLIN SODIUM-DEXTROSE 1-4 GM/50ML-% IV SOLN
1.0000 g | INTRAVENOUS | Status: DC
  Administered 2024-09-01 – 2024-09-02 (×2): 1 g via INTRAVENOUS
  Filled 2024-09-01 (×2): qty 50

## 2024-09-01 MED ORDER — OXYCODONE HCL 5 MG PO TABS: 10.0000 mg | ORAL_TABLET | Freq: Two times a day (BID) | ORAL | Status: DC | PRN

## 2024-09-01 MED ORDER — HYDROMORPHONE HCL 1 MG/ML IJ SOLN
1.0000 mg | INTRAMUSCULAR | Status: DC | PRN
  Administered 2024-09-01 – 2024-09-02 (×2): 1 mg via INTRAVENOUS
  Filled 2024-09-01 (×3): qty 1

## 2024-09-01 MED ORDER — HYDROMORPHONE HCL 1 MG/ML IJ SOLN
INTRAMUSCULAR | Status: AC
  Filled 2024-09-01: qty 1

## 2024-09-01 MED ORDER — HEPARIN SODIUM (PORCINE) 1000 UNIT/ML IJ SOLN
INTRAMUSCULAR | Status: AC
  Filled 2024-09-01: qty 4

## 2024-09-01 MED ORDER — HYDROMORPHONE HCL 1 MG/ML IJ SOLN
1.0000 mg | INTRAMUSCULAR | Status: DC | PRN
  Administered 2024-09-01 (×2): 1 mg via INTRAVENOUS
  Filled 2024-09-01: qty 1

## 2024-09-01 MED ORDER — OXYCODONE HCL 5 MG PO TABS
5.0000 mg | ORAL_TABLET | Freq: Four times a day (QID) | ORAL | Status: AC | PRN
  Administered 2024-09-01 – 2024-09-02 (×3): 10 mg via ORAL
  Filled 2024-09-01 (×3): qty 2

## 2024-09-01 NOTE — Plan of Care (Signed)
" °  Problem: Health Behavior/Discharge Planning: Goal: Ability to identify and utilize available resources and services will improve Outcome: Progressing Goal: Ability to manage health-related needs will improve Outcome: Progressing   Problem: Nutritional: Goal: Maintenance of adequate nutrition will improve Outcome: Progressing   Problem: Skin Integrity: Goal: Risk for impaired skin integrity will decrease Outcome: Progressing   Problem: Clinical Measurements: Goal: Cardiovascular complication will be avoided Outcome: Progressing   Problem: Activity: Goal: Risk for activity intolerance will decrease Outcome: Progressing   Problem: Coping: Goal: Level of anxiety will decrease Outcome: Progressing   "

## 2024-09-01 NOTE — Progress Notes (Signed)
" °   09/01/24 1204  Vitals  Temp (!) 97.4 F (36.3 C)  BP 101/63  Pulse Rate 85  Resp 19  Type of Weight Post-Dialysis  Oxygen Therapy  SpO2 95 %  O2 Device Room Air  Patient Activity (if Appropriate) In bed  Pulse Oximetry Type Continuous  Oximetry Probe Site Changed No  Post Treatment  Dialyzer Clearance Clear  Hemodialysis Intake (mL) 0 mL  Liters Processed 74.2  Fluid Removed (mL) 2500 mL  Tolerated HD Treatment Yes  Post-Hemodialysis Comments Goal has been met. Tx has been tolerated. Vs have remained stable. Access is clean dry and intact. Patient c/o extreme pain in both legs. Received pain medication  AVG/AVF Arterial Site Held (minutes) 0 minutes  AVG/AVF Venous Site Held (minutes) 0 minutes  Hemodialysis Catheter Right Internal jugular Double lumen Permanent (Tunneled)  Placement Date/Time: 10/03/21 1518   Time Out: Correct patient;Correct site;Correct procedure  Maximum sterile barrier precautions: Hand hygiene;Cap;Mask;Sterile gown;Large sterile sheet;Sterile gloves  Site Prep: Chlorhexidine  (preferred)  Local Anes...  Site Condition No complications  Blue Lumen Status Heparin  locked;Dead end cap in place  Red Lumen Status Dead end cap in place;Heparin  locked  Purple Lumen Status N/A  Catheter fill solution Heparin  1000 units/ml  Catheter fill volume (Arterial) 1600 cc  Catheter fill volume (Venous) 1600  Dressing Type Transparent  Interventions New dressing  Drainage Description None  Dressing Change Due 09/08/24  Post treatment catheter status Capped and Clamped    "

## 2024-09-01 NOTE — Progress Notes (Signed)
 " [facility]  ROUNDING NOTE   Subjective:   [name] s a 59 year old female with past medical conditions including anemia, GERD, hypertension, stroke, diabetes, and end-stage renal disease on hemodialysis. Patient presents to the emergency department for evaluation of lower extremity wound. She has been admitted for Bilateral cellulitis of lower leg [L03.116, L03.115] Complicated wound infection [T14.8XXA, L08.9]  Patient is known to our practice and receives outpatient dialysis treatments at [facility] MWF schedule, supervised by Dr. Marcelino. Patient was sent to ED by wound clinic for further evaluation of leg wound. Patient states wound has progressively gotten worse over the past few months. Has maintained all appts at wound clinic. Denies fever or chills. Pain has increased.   Imagining concerning for chronic osteomyelitis.   We have been consulted to manage dialysis needs.    Objective:  Vital signs in last 24 hours:  Temp:  [97.4 F (36.3 C)-98.2 F (36.8 C)] 98.1 F (36.7 C) (01/02 1549) Pulse Rate:  [76-100] 85 (01/02 1549) Resp:  [15-22] 16 (01/02 1549) BP: (90-108)/(53-72) 90/56 (01/02 1549) SpO2:  [90 %-100 %] 97 % (01/02 1549) Weight:  [77.2 kg-83 kg] 77.2 kg (01/02 1206)  Weight change: 3 kg Filed Weights   09/01/24 0404 09/01/24 0746 09/01/24 1206  Weight: 83 kg 79.9 kg 77.2 kg    Intake/Output: I/O last 3 completed shifts: In: 929.9 [P.O.:840; IV Piggyback:89.9] Out: -    Intake/Output this shift:  Total I/O In: -  Out: 2500 [Other:2500]  Physical Exam: General: NAD  Head: Normocephalic, atraumatic. Moist oral mucosal membranes  Eyes: Anicteric  Neck: Supple  Lungs:  Clear to auscultation  Heart: Regular rate and rhythm  Abdomen:  Soft, nontender  Extremities:  No peripheral edema.  Neurologic: Awake, alert, conversant  Skin: BLE ace bandages  Access: Rt permcath    Basic Metabolic Panel: Recent Labs  Lab 08/30/24 1348  08/31/24 0540 09/01/24 0605  NA 141 139 138  K 3.7 3.9 4.2  CL 92* 93* 92*  CO2 21* 19* 16*  GLUCOSE 150* 186* 130*  BUN 12 17 23*  CREATININE 2.53* 3.16* 4.39*  CALCIUM  9.9 9.8 10.3  MG 2.0  --   --     Liver Function Tests: Recent Labs  Lab 08/30/24 1348  AST 15  ALT <5  ALKPHOS 230*  BILITOT 0.6  PROT 7.0  ALBUMIN 3.9   No results for input(s): LIPASE, AMYLASE in the last 168 hours. No results for input(s): AMMONIA in the last 168 hours.  CBC: Recent Labs  Lab 08/30/24 1348 08/31/24 0540  WBC 7.6 8.5  NEUTROABS 6.5  --   HGB 11.7* 10.5*  HCT 38.7 34.5*  MCV 100.8* 101.8*  PLT 292 257    Cardiac Enzymes: No results for input(s): CKTOTAL, CKMB, CKMBINDEX, TROPONINI in the last 168 hours.  BNP: Invalid input(s): POCBNP  CBG: Recent Labs  Lab 08/31/24 1231 08/31/24 1650 08/31/24 2330 09/01/24 1225 09/01/24 1556  GLUCAP 119* 143* 163* 138* 235*    Microbiology: Results for orders placed or performed during the hospital encounter of 08/30/24  Blood Culture (routine x 2)     Status: None (Preliminary result)   Collection Time: 08/30/24  5:21 PM   Specimen: BLOOD  Result Value Ref Range Status   Specimen Description BLOOD BLOOD RIGHT HAND  Final   Special Requests   Final    BOTTLES DRAWN AEROBIC AND ANAEROBIC Blood Culture results may not be optimal due to an inadequate volume  of blood received in culture bottles   Culture   Final    NO GROWTH 2 DAYS Performed at [facility], [address] [place], [place] [address]    Report Status PENDING  Incomplete  Blood Culture (routine x 2)     Status: None (Preliminary result)   Collection Time: 08/30/24  5:25 PM   Specimen: BLOOD  Result Value Ref Range Status   Specimen Description BLOOD RIGHT ANTECUBITAL  Final   Special Requests   Final    BOTTLES DRAWN AEROBIC AND ANAEROBIC Blood Culture adequate volume   Culture   Final    NO GROWTH 2 DAYS Performed at [facility], [address] [place], [place] [address]    Report Status PENDING  Incomplete  Aerobic Culture w Gram Stain (superficial specimen)     Status: None (Preliminary result)   Collection Time: 08/31/24  1:14 AM   Specimen: Leg; Wound  Result Value Ref Range Status   Specimen Description   Final    LEG Performed at [facility], [address] [place], [place] [address]    Special Requests   Final    NONE Performed at [facility], [address] [place], [place] [address]    Gram Stain   Final    NO WBC SEEN FEW GRAM NEGATIVE RODS RARE GRAM POSITIVE RODS RARE GRAM POSITIVE COCCI    Culture   Final    ABUNDANT PSEUDOMONAS ALCALIGENES FEW ENTEROCOCCUS FAECALIS ABUNDANT STENOTROPHOMONAS MALTOPHILIA CULTURE REINCUBATED FOR BETTER GROWTH Performed at [facility], [address], [place], [place] [address]    Report Status PENDING  Incomplete    Coagulation Studies: No results for input(s): LABPROT, INR in the last 72 hours.  Urinalysis: No results for input(s): COLORURINE, LABSPEC, PHURINE, GLUCOSEU, HGBUR, BILIRUBINUR, KETONESUR, PROTEINUR, UROBILINOGEN, NITRITE, LEUKOCYTESUR in the last 72 hours.  Invalid input(s): APPERANCEUR    Imaging: MR TIBIA FIBULA LEFT WO CONTRAST Result Date: 08/31/2024 CLINICAL DATA:  Bilateral leg wounds.  Concern for osteomyelitis. EXAM: MRI OF LOWER LEFT EXTREMITY WITHOUT CONTRAST TECHNIQUE: Multiplanar, multisequence MR imaging of the left tibia and fibula was performed. No intravenous contrast was administered. COMPARISON:  Radiographs dated 08/30/2024. FINDINGS: Bones/Joint/Cartilage Patchy serpiginous heterogenous signal abnormality in the proximal tibial diaphysis is most compatible with osteonecrosis. Fibula demonstrates normal marrow signal intensity. No convincing evidence of acute osteomyelitis. No fracture. Muscles and Tendons Generalized fatty atrophy with increased T2  signal of the posterior greater than anterior compartment calf musculature, likely reflecting chronic denervation changes, however, myositis is not excluded. No intramuscular collection. Intramuscular and intermuscular fat planes are maintained. Visualized patellar tendon is intact. Soft tissue Cutaneous wounds and irregularity of the anterior mid to distal shin. Diffuse cutaneous thickening and subcutaneous edema of the left calf. No loculated fluid collection. Findings are compatible with cellulitis in the appropriate setting. IMPRESSION: 1. No evidence of acute osteomyelitis. 2. Cutaneous wounds and irregularity of the anterior mid to distal shin. Diffuse cutaneous thickening and subcutaneous edema of the left calf. No loculated fluid collection. Findings are compatible with cellulitis in the appropriate setting. 3. Generalized fatty atrophy with increased T2 signal of the posterior greater than anterior compartment calf musculature, likely reflecting chronic denervation changes, however, myositis is not excluded. No intramuscular collection. 4. 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 Electronically Signed   By: Harrietta Sherry M.D.   On: 08/31/2024 16:49   MR TIBIA FIBULA RIGHT WO CONTRAST Result  Date: 08/31/2024 CLINICAL DATA:  Bilateral leg wounds.  Concern for osteomyelitis. EXAM: MRI OF LOWER RIGHT EXTREMITY WITHOUT CONTRAST TECHNIQUE: Multiplanar, multisequence MR imaging of the right tibia and fibula was performed. No intravenous contrast was administered. 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 Fibula demonstrates normal marrow signal intensity. No convincing evidence of acute osteomyelitis. No fracture. Muscles and Tendons Generalized fatty atrophy with increased T2 signal of the anterior and posterior compartment calf musculature,  likely reflecting chronic denervation changes, however, myositis is not excluded. No intramuscular collection. Intramuscular and intermuscular fat planes are maintained. Visualized patellar tendon is intact. Soft tissue Cutaneous wounds and irregularity of the mid to distal shin. Diffuse cutaneous thickening and subcutaneous edema of the right calf. No loculated fluid collection. Findings are compatible with cellulitis in the appropriate setting. IMPRESSION: 1. No evidence of acute osteomyelitis. 2. Cutaneous wounds and irregularity of the mid to distal shin. Diffuse cutaneous thickening and subcutaneous edema of the right calf. No loculated fluid collection. Findings are compatible with cellulitis in the appropriate setting. 3. Generalized fatty atrophy with increased T2 signal of the anterior and posterior compartment calf musculature, likely reflecting chronic denervation changes, however, myositis is not excluded. No intramuscular collection. 4. 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 Electronically Signed   By: Harrietta Sherry M.D.   On: 08/31/2024 16:43   DG Chest Port 1 View Result Date: 08/31/2024 CLINICAL DATA:  Pacemaker. Technologist notes state complicated wound infection. EXAM: PORTABLE CHEST 1 VIEW COMPARISON:  Radiographs 03/22/2024 FINDINGS: Lead less pacemaker projects over the left mediastinum. Right-sided dialysis catheter tip in the right atrium. Overall low lung volumes. Cardiomegaly is stable. Aortic atherosclerosis. No pulmonary edema. No confluent airspace disease. No pleural fluid or pneumothorax. Grossly stable osseous structures. IMPRESSION: 1. Lead less pacemaker projects over the left mediastinum. 2. Stable cardiomegaly. Electronically Signed   By: Andrea Gasman M.D.   On: 08/31/2024 10:21   DG Tibia/Fibula Right Result Date: 08/30/2024 EXAM: 2 VIEW(S) XRAY OF THE RIGHT TIBIA AND FIBULA 08/30/2024 05:40:32 PM  COMPARISON: None available. CLINICAL HISTORY: Complicated wound infection. FINDINGS: BONES AND JOINTS: There is some periosteal reaction along the mid and proximal diaphysis of the fibula. No cortical erosions are identified. No acute fracture. No malalignment. SOFT TISSUES: Diffuse soft tissue edema. Vascular calcifications and scattered phleboliths noted. IMPRESSION: 1. Periosteal reaction along the mid and proximal diaphysis of the fibula, with no cortical erosions identified. Findings can be seen in the setting of chronic osteomyelitis . 2. Diffuse soft tissue edema. Electronically signed by: Greig Pique MD 08/30/2024 06:55 PM EST RP Workstation: [id number]   DG Tibia/Fibula Left Result Date: 08/30/2024 CLINICAL DATA:  Wound infection. EXAM: DG TIBIA/FIBULA 2V*L* COMPARISON:  Radiograph dated 03/22/2024. FINDINGS: No acute fracture or dislocation. The bones are osteopenic. There is diffuse subcutaneous edema and scattered areas of subcutaneous calcification suggestive of chronic venous stasis. No soft tissue gas. IMPRESSION: 1. No acute fracture or dislocation. 2. Diffuse subcutaneous edema.  No soft tissue gas. Electronically Signed   By: Vanetta Chou M.D.   On: 08/30/2024 18:52     Medications:     ceFAZolin  (ANCEF ) IV 1 g (09/01/24 1311)   sodium thiosulfate 25 g in sodium chloride  0.9 % 200 mL Infusion for Calciphylaxis 25 g (09/01/24 1101)    Chlorhexidine  Gluconate Cloth  6 each Topical  Daily   heparin   5,000 Units Subcutaneous Q8H   insulin  aspart  0-5 Units Subcutaneous QHS   insulin  aspart  0-9 Units Subcutaneous TID WC   pentoxifylline   400 mg Oral Daily   sodium chloride  flush  3 mL Intravenous Q12H   acetaminophen  **OR** acetaminophen , albuterol , HYDROmorphone  (DILAUDID ) injection, ondansetron  **OR** ondansetron  (ZOFRAN ) IV, oxyCODONE , senna-docusate  Assessment/ Plan:  [name] is a 59 y.o.  female with past medical conditions including anemia, GERD, hypertension,  stroke, diabetes, and end-stage renal disease on hemodialysis. Patient presents to the emergency department for wound evaluation.   [facility]/MWF/Rt permcath  End-stage renal disease on hemodialysis.  Last treatment completed on Wednesday.  Patient scheduled to receive dialysis today, UF goal 1 L as tolerated.  Next treatment scheduled for Monday.  2.  Cellulitis, BLE.  X-ray showing possible osteomyelitis on right lower extremity.  Currently prescribed vancomycin .  Primary team considering cefepime .  Does receive sodium thiosulfate during dialysis.  3. Anemia of chronic kidney disease Lab Results  Component Value Date   HGB 10.5 (L) 08/31/2024    Hemoglobin remains adequate.  No need for ESA's during this time.  4. Secondary Hyperparathyroidism: with outpatient labs: None available currently  Lab Results  Component Value Date   CALCIUM  10.3 09/01/2024   CAION 1.00 (L) 03/09/2024   PHOS 3.3 05/01/2024    Will continue to monitor bone minerals during this admission.   LOS: 1 Reeya Bound 1/2/20264:16 PM   "

## 2024-09-01 NOTE — Consult Note (Addendum)
 Per bedside nurse wounds do not appear to be draining excessively as prior to admission. Primary MD plans to assess legs tomorrow with dressing change, will change orders for Xeroform and DC the Calcium  Alginate.  MRI did not reveal osteomyelitis.  Vascular surgeon has evaluated patient with no recommendations for intervention.    New wound care orders placed to  Cleanse B lower leg wounds with Vashe, do not rinse.  Apply Xeroform gauze ([place] [phone]) to wound beds daily, cover with ABD pads and secure with kerlix roll gauze beginning right above toes and ending right below knees.  Apply Ace bandage wrapped in same fashion as kerlix for light compression if patient can tolerate.   Above discussed with bedside nurse.  Patient should resume follow-up with wound care center as outpatient for ongoing care.   Thank you,    Powell Bar MSN, RN-BC, [employer]

## 2024-09-01 NOTE — TOC Initial Note (Signed)
 Transition of Care [facility]) - Initial/Assessment Note    Patient Details  Name: [name] MRN: [id number] Date of Birth: September 25, 1965  Transition of Care [facility]) CM/SW Contact:    Elizabeth JONETTA Hamilton, RN Phone Number: 09/01/2024, 4:27 PM  Clinical Narrative:                  Spoke with patient, introduced self and explained role. Patient mostly independent and verbalizes using wheelchair at home due to not being able to bear weight on BLE's. Patient lives at home with her husband [name], he will assist with transportation at discharge. Patient verbalized [facility] as [facility] choice and will need the nurse for wound care right away and is agreeable to adding PT when medically appropriate.   Sent referral in [place] to [place], [place] accepted, Myra selected in [place] and referral completed, Joane at [place] notified. Start of care anticipated to be 1/4-1/5, nurse visits 1-2 times per week, HH will teach patient's spouse wound care.   Expected Discharge Plan: Home w Home Health Services Barriers to Discharge: Continued Medical Work up   Patient Goals and CMS Choice Patient states their goals for this hospitalization and ongoing recovery are:: to go home   Choice offered to / list presented to : Patient      Expected Discharge Plan and Services   Discharge Planning Services: CM Consult   Living arrangements for the past 2 months: Single Family Home                           HH Arranged: RN, PT [facility] Agency: [facility] Date [facility] Agency Contacted: 09/01/24 Time HH Agency Contacted: 1626 Representative spoke with at [facility] Agency: Joane and Hollie  Prior Living Arrangements/Services Living arrangements for the past 2 months: Single Family Home Lives with:: Spouse Patient language and need for interpreter reviewed:: Yes Do you feel safe going back to the place where you live?: Yes      Need for Family Participation in Patient Care: Yes (Comment) Care giver support system in place?: Yes  (comment)   Criminal Activity/Legal Involvement Pertinent to Current Situation/Hospitalization: No - Comment as needed  Activities of Daily Living   ADL Screening (condition at time of admission) Independently performs ADLs?: Yes (appropriate for developmental age) Is the patient deaf or have difficulty hearing?: No Does the patient have difficulty seeing, even when wearing glasses/contacts?: No Does the patient have difficulty concentrating, remembering, or making decisions?: No  Permission Sought/Granted                  Emotional Assessment   Attitude/Demeanor/Rapport: Engaged Affect (typically observed): Appropriate Orientation: : Oriented to Place, Oriented to  Time, Oriented to Situation, Oriented to Self Alcohol / Substance Use: Not Applicable Psych Involvement: No (comment)  Admission diagnosis:  Bilateral cellulitis of lower leg [L03.116, L03.115] Complicated wound infection [T14.8XXA, L08.9] Patient Active Problem List   Diagnosis Date Noted   Complicated wound infection 08/30/2024   Pulmonary hypertension (HCC) 07/25/2024   Presence of heart assist device (HCC) 07/25/2024   Asthma 07/25/2024   Chronic pain syndrome 07/03/2024   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   PAD (peripheral artery disease) 05/01/2024   Polyarthritis 03/22/2024   Type 2 diabetes mellitus with end-stage renal disease (HCC) 03/20/2024   HFrEF (heart failure with reduced ejection fraction) (HCC) 03/20/2024   Mitral regurgitation 03/20/2024   Complete heart block (HCC) 03/02/2024   Osteoporosis with current pathological fracture 12/14/2021  History of hemiarthroplasty of left hip 11/19/2021   History of fracture of left hip 11/15/2021   Anemia of chronic disease 11/14/2021   Diabetic peripheral neuropathy (HCC)    Quadriceps tendon rupture 10/16/2021   ESRD on dialysis [facility]) 09/27/2018   Secondary hyperparathyroidism of renal origin 03/07/2018   Stage 5 chronic renal impairment associated with  type 2 diabetes mellitus (HCC) 07/26/2017   Hyperphosphatemia 09/29/2016   Dyslipidemia 08/08/2015   PCP:  Franchot Isaiah LABOR, MD Pharmacy:   [facility] [phone] [address], [place] - [address] [place] [address] Phone: [phone] Fax: [phone]  [facility] [address] [place] [address] Phone: [phone] Fax: [phone]     Social Drivers of Health (SDOH) Social History: SDOH Screenings   Food Insecurity: No Food Insecurity (08/30/2024)  Housing: Low Risk (08/30/2024)  Transportation Needs: No Transportation Needs (08/30/2024)  Utilities: Not At Risk (08/30/2024)  Depression (PHQ2-9): Low Risk (08/10/2024)  Recent Concern: Depression (PHQ2-9) - Medium Risk (07/03/2024)  Financial Resource Strain: Low Risk (07/25/2024)  Physical Activity: Inactive (07/25/2024)  Social Connections: Socially Integrated (07/25/2024)  Recent Concern: Social Connections - Moderately Isolated (05/01/2024)  Tobacco Use: Low Risk (08/30/2024)   SDOH Interventions:     Readmission Risk Interventions    03/03/2024    3:31 PM  Readmission Risk Prevention Plan  Transportation Screening Complete  PCP or Specialist Appt within 5-7 Days --  Home Care Screening Complete  Medication Review (RN CM) Complete

## 2024-09-01 NOTE — Progress Notes (Signed)
 " PROGRESS NOTE    [name]  FMW:[id number] DOB: Jan 04, 1966 DOA: 08/30/2024 PCP: Franchot Isaiah LABOR, MD  Outpatient Specialists: wound care, nephrology, cardiology    Brief Narrative:   [name] is a 59 y.o. 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  Patient states she has been having worsening pain and redness on her bilateral lower extremities despite taking her pain medication. On arrival to the ED patient was noted to be HDS stable.  Lab work obtained.  CBC without leukocytosis, mild anemia that is borderline macrocytic.  CMP with ESRD changes along with moderate hyperglycemia, ALP elevation, and chronic anion gap metabolic acidosis.  ESR checked and elevated at 63.  Blood cultures ordered, wound cultures ordered.  Wound cultures were obtained by wound center.  Plain radiography of the lower extremities shows right x-ray showing changes consistent with chronic osteomyelitis along with diffuse soft tissue edema with left x-ray showing diffuse subcutaneous edema but no gas formation.  Given patient presentation and wound care recommendations of IV antibiotics, TRH contacted for admission.    Assessment & Plan:   Principal Problem:   Complicated wound infection Active Problems:   ESRD on dialysis (HCC)   Complete heart block (HCC)   Type 2 diabetes mellitus with end-stage renal disease (HCC)   HFrEF (heart failure with reduced ejection fraction) (HCC)   PAD (peripheral artery disease)   Calciphylaxis   Asthma   # Calciphylaxis # Cellulitis With x ray showing possible osteo on the right. Inflammatory markers are elevated. Non-con MRI no signs osteomyelitis, abscess. Patient reports improving - continue cefazolin , will ask ID to weigh in regarding superficial swab culture results - will directly observe  wounds tomorrow with dressing change - pain control  # PAD - vascular following, not advising additional vascular w/u for the time being - cont home pentoxifylline   # ESRD Mwf hemodialysis - nephro consulted, maintaining home dialysis schedule, tolerated today  # T1DM controlled - ssi  # CHB S/p ppm  # HFrecoveredEF Euvolemic - volume w/ dialysis    DVT prophylaxis: heparin  Code Status: full Family Communication: none at bedside  Level of care: Med-Surg Status is: Observation     Consultants:  Vascular nephrology  Procedures: Hemodialysis pending  Antimicrobials:  Vanc/cefepime     Subjective: Reports stable leg pain, no fevers  Objective: Vitals:   09/01/24 1152 09/01/24 1204 09/01/24 1206 09/01/24 1312  BP: (!) 102/53 101/63  (!) 105/59  Pulse: 84 85  85  Resp: (!) 22 19    Temp:  (!) 97.4 F (36.3 C)  98.2 F (36.8 C)  TempSrc:    Oral  SpO2: 100% 95%  98%  Weight:   77.2 kg   Height:        Intake/Output Summary (Last 24 hours) at 09/01/2024 1404 Last data filed at 09/01/2024 1204 Gross per 24 hour  Intake 240 ml  Output 2500 ml  Net -2260 ml   Filed Weights   09/01/24 0404 09/01/24 0746 09/01/24 1206  Weight: 83 kg 79.9 kg 77.2 kg    Examination:  General exam: Appears calm and comfortable  Respiratory system: Clear to auscultation. Respiratory effort normal. Cardiovascular system: S1 & S2 heard, RRR. Soft systolic murmur Gastrointestinal system: Abdomen is nondistended, soft and nontender.   Central nervous system: Alert and oriented. No focal neurological deficits. Extremities: Symmetric  5 x 5 power. Skin: L    Psychiatry: Judgement and insight appear normal. Mood & affect appropriate.     Data Reviewed: I have personally reviewed following labs and imaging studies  CBC: Recent Labs  Lab 08/30/24 1348 08/31/24 0540  WBC 7.6 8.5  NEUTROABS 6.5  --   HGB 11.7* 10.5*  HCT 38.7 34.5*  MCV 100.8* 101.8*  PLT 292 257    Basic Metabolic Panel: Recent Labs  Lab 08/30/24 1348 08/31/24 0540 09/01/24 0605  NA 141 139 138  K 3.7 3.9 4.2  CL 92* 93* 92*  CO2 21* 19* 16*  GLUCOSE 150* 186* 130*  BUN 12 17 23*  CREATININE 2.53* 3.16* 4.39*  CALCIUM  9.9 9.8 10.3  MG 2.0  --   --    GFR: Estimated Creatinine Clearance: 14.6 mL/min (A) (by C-G formula based on SCr of 4.39 mg/dL (H)). Liver Function Tests: Recent Labs  Lab 08/30/24 1348  AST 15  ALT <5  ALKPHOS 230*  BILITOT 0.6  PROT 7.0  ALBUMIN 3.9   No results for input(s): LIPASE, AMYLASE in the last 168 hours. No results for input(s): AMMONIA in the last 168 hours. Coagulation Profile: No results for input(s): INR, PROTIME in the last 168 hours. Cardiac Enzymes: No results for input(s): CKTOTAL, CKMB, CKMBINDEX, TROPONINI in the last 168 hours. BNP (last 3 results) No results for input(s): PROBNP in the last 8760 hours. HbA1C: No results for input(s): HGBA1C in the last 72 hours. CBG: Recent Labs  Lab 08/31/24 0851 08/31/24 1231 08/31/24 1650 08/31/24 2330 09/01/24 1225  GLUCAP 154* 119* 143* 163* 138*   Lipid Profile: No results for input(s): CHOL, HDL, LDLCALC, TRIG, CHOLHDL, LDLDIRECT in the last 72 hours. Thyroid  Function Tests: No results for input(s): TSH, T4TOTAL, FREET4, T3FREE, THYROIDAB in the last 72 hours. Anemia Panel: No results for input(s): VITAMINB12, FOLATE, FERRITIN, TIBC, IRON , RETICCTPCT in the last 72 hours. Urine analysis: No results found for: COLORURINE, APPEARANCEUR, LABSPEC, PHURINE, GLUCOSEU, HGBUR, BILIRUBINUR, KETONESUR, PROTEINUR, UROBILINOGEN, NITRITE, LEUKOCYTESUR Sepsis Labs: @LABRCNTIP (procalcitonin:4,lacticidven:4)  ) Recent Results (from the past 240 hours)  Blood Culture (routine x 2)     Status: None (Preliminary result)   Collection Time: 08/30/24  5:21 PM   Specimen: BLOOD  Result Value Ref Range  Status   Specimen Description BLOOD BLOOD RIGHT HAND  Final   Special Requests   Final    BOTTLES DRAWN AEROBIC AND ANAEROBIC Blood Culture results may not be optimal due to an inadequate volume of blood received in culture bottles   Culture   Final    NO GROWTH 2 DAYS Performed at [facility], [address] [place], [place] [address]    Report Status PENDING  Incomplete  Blood Culture (routine x 2)     Status: None (Preliminary result)   Collection Time: 08/30/24  5:25 PM   Specimen: BLOOD  Result Value Ref Range Status   Specimen Description BLOOD RIGHT ANTECUBITAL  Final   Special Requests   Final    BOTTLES DRAWN AEROBIC AND ANAEROBIC Blood Culture adequate volume   Culture   Final    NO GROWTH 2 DAYS Performed at [facility], [address] [place], [place] [address]    Report Status PENDING  Incomplete  Aerobic Culture w Gram Stain (superficial specimen)     Status: None (Preliminary result)   Collection Time: 08/31/24  1:14 AM   Specimen: Leg; Wound  Result Value Ref Range Status  Specimen Description   Final    LEG Performed at [facility], [address] [place], [place] [address]    Special Requests   Final    NONE Performed at [facility], [address] [place], [place] [address]    Gram Stain   Final    NO WBC SEEN FEW VONNE NEGATIVE RODS RARE GRAM POSITIVE RODS RARE GRAM POSITIVE COCCI    Culture   Final    ABUNDANT PSEUDOMONAS ALCALIGENES FEW ENTEROCOCCUS FAECALIS ABUNDANT STENOTROPHOMONAS MALTOPHILIA CULTURE REINCUBATED FOR BETTER GROWTH Performed at [facility], [address] [place], [place] [address]    Report Status PENDING  Incomplete         Radiology Studies: MR TIBIA FIBULA LEFT WO CONTRAST Result Date: 08/31/2024 CLINICAL DATA:  Bilateral leg wounds.  Concern for osteomyelitis. EXAM: MRI OF LOWER LEFT EXTREMITY WITHOUT CONTRAST TECHNIQUE: Multiplanar, multisequence MR imaging of  the left tibia and fibula was performed. No intravenous contrast was administered. COMPARISON:  Radiographs dated 08/30/2024. FINDINGS: Bones/Joint/Cartilage Patchy serpiginous heterogenous signal abnormality in the proximal tibial diaphysis is most compatible with osteonecrosis. Fibula demonstrates normal marrow signal intensity. No convincing evidence of acute osteomyelitis. No fracture. Muscles and Tendons Generalized fatty atrophy with increased T2 signal of the posterior greater than anterior compartment calf musculature, likely reflecting chronic denervation changes, however, myositis is not excluded. No intramuscular collection. Intramuscular and intermuscular fat planes are maintained. Visualized patellar tendon is intact. Soft tissue Cutaneous wounds and irregularity of the anterior mid to distal shin. Diffuse cutaneous thickening and subcutaneous edema of the left calf. No loculated fluid collection. Findings are compatible with cellulitis in the appropriate setting. IMPRESSION: 1. No evidence of acute osteomyelitis. 2. Cutaneous wounds and irregularity of the anterior mid to distal shin. Diffuse cutaneous thickening and subcutaneous edema of the left calf. No loculated fluid collection. Findings are compatible with cellulitis in the appropriate setting. 3. Generalized fatty atrophy with increased T2 signal of the posterior greater than anterior compartment calf musculature, likely reflecting chronic denervation changes, however, myositis is not excluded. No intramuscular collection. 4. 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 Electronically Signed   By: Harrietta Sherry M.D.   On: 08/31/2024 16:49   MR TIBIA FIBULA RIGHT WO CONTRAST Result Date: 08/31/2024 CLINICAL DATA:  Bilateral leg wounds.  Concern for osteomyelitis. EXAM: MRI OF LOWER RIGHT EXTREMITY WITHOUT CONTRAST TECHNIQUE: Multiplanar, multisequence MR imaging of the right tibia and fibula was performed. No  intravenous contrast was administered. 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 Fibula demonstrates normal marrow signal intensity. No convincing evidence of acute osteomyelitis. No fracture. Muscles and Tendons Generalized fatty atrophy with increased T2 signal of the anterior and posterior compartment calf musculature, likely reflecting chronic denervation changes, however, myositis is not excluded. No intramuscular collection. Intramuscular and intermuscular fat planes are maintained. Visualized patellar tendon is intact. Soft tissue Cutaneous wounds and irregularity of the mid to distal shin. Diffuse cutaneous thickening and subcutaneous edema of the right calf. No loculated fluid collection. Findings are compatible with cellulitis in the appropriate setting. IMPRESSION: 1. No evidence of acute osteomyelitis. 2. Cutaneous wounds and irregularity of the mid to distal shin. Diffuse cutaneous thickening and subcutaneous edema of the right calf. No loculated fluid collection. Findings are compatible with cellulitis in the appropriate setting. 3. Generalized fatty atrophy with increased T2 signal of the  anterior and posterior compartment calf musculature, likely reflecting chronic denervation changes, however, myositis is not excluded. No intramuscular collection. 4. 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 Electronically Signed   By: Harrietta Sherry M.D.   On: 08/31/2024 16:43   DG Chest Port 1 View Result Date: 08/31/2024 CLINICAL DATA:  Pacemaker. Technologist notes state complicated wound infection. EXAM: PORTABLE CHEST 1 VIEW COMPARISON:  Radiographs 03/22/2024 FINDINGS: Lead less pacemaker projects over the left mediastinum. Right-sided dialysis catheter tip in the right atrium. Overall low lung volumes.  Cardiomegaly is stable. Aortic atherosclerosis. No pulmonary edema. No confluent airspace disease. No pleural fluid or pneumothorax. Grossly stable osseous structures. IMPRESSION: 1. Lead less pacemaker projects over the left mediastinum. 2. Stable cardiomegaly. Electronically Signed   By: Andrea Gasman M.D.   On: 08/31/2024 10:21   DG Tibia/Fibula Right Result Date: 08/30/2024 EXAM: 2 VIEW(S) XRAY OF THE RIGHT TIBIA AND FIBULA 08/30/2024 05:40:32 PM COMPARISON: None available. CLINICAL HISTORY: Complicated wound infection. FINDINGS: BONES AND JOINTS: There is some periosteal reaction along the mid and proximal diaphysis of the fibula. No cortical erosions are identified. No acute fracture. No malalignment. SOFT TISSUES: Diffuse soft tissue edema. Vascular calcifications and scattered phleboliths noted. IMPRESSION: 1. Periosteal reaction along the mid and proximal diaphysis of the fibula, with no cortical erosions identified. Findings can be seen in the setting of chronic osteomyelitis . 2. Diffuse soft tissue edema. Electronically signed by: Greig Pique MD 08/30/2024 06:55 PM EST RP Workstation: [id number]   DG Tibia/Fibula Left Result Date: 08/30/2024 CLINICAL DATA:  Wound infection. EXAM: DG TIBIA/FIBULA 2V*L* COMPARISON:  Radiograph dated 03/22/2024. FINDINGS: No acute fracture or dislocation. The bones are osteopenic. There is diffuse subcutaneous edema and scattered areas of subcutaneous calcification suggestive of chronic venous stasis. No soft tissue gas. IMPRESSION: 1. No acute fracture or dislocation. 2. Diffuse subcutaneous edema.  No soft tissue gas. Electronically Signed   By: Vanetta Chou M.D.   On: 08/30/2024 18:52        Scheduled Meds:  Chlorhexidine  Gluconate Cloth  6 each Topical Daily   heparin   5,000 Units Subcutaneous Q8H   insulin  aspart  0-5 Units Subcutaneous QHS   insulin  aspart  0-9 Units Subcutaneous TID WC   pentoxifylline   400 mg Oral Daily   sodium chloride   flush  3 mL Intravenous Q12H   Continuous Infusions:   ceFAZolin  (ANCEF ) IV 1 g (09/01/24 1311)   sodium thiosulfate 25 g in sodium chloride  0.9 % 200 mL Infusion for Calciphylaxis 25 g (09/01/24 1101)     LOS: 1 day     Devaughn KATHEE Ban, MD [facility]   If 7PM-7AM, please contact night-coverage [email or web address] Password [email or web address] 09/01/2024, 2:04 PM     "

## 2024-09-02 DIAGNOSIS — T148XXA Other injury of unspecified body region, initial encounter: Secondary | ICD-10-CM | POA: Diagnosis not present

## 2024-09-02 DIAGNOSIS — L089 Local infection of the skin and subcutaneous tissue, unspecified: Secondary | ICD-10-CM | POA: Diagnosis not present

## 2024-09-02 LAB — GLUCOSE, CAPILLARY
Glucose-Capillary: 145 mg/dL — ABNORMAL HIGH (ref 70–99)
Glucose-Capillary: 212 mg/dL — ABNORMAL HIGH (ref 70–99)

## 2024-09-02 LAB — C-REACTIVE PROTEIN: CRP: 10.6 mg/dL — ABNORMAL HIGH

## 2024-09-02 MED ORDER — CEPHALEXIN 500 MG PO CAPS: 500.0000 mg | ORAL_CAPSULE | Freq: Four times a day (QID) | ORAL | 0 refills | Status: AC

## 2024-09-02 MED ORDER — GABAPENTIN 100 MG PO CAPS: 100.0000 mg | ORAL_CAPSULE | Freq: Every day | ORAL | Status: DC

## 2024-09-02 MED ORDER — GABAPENTIN 100 MG PO CAPS: 100.0000 mg | ORAL_CAPSULE | Freq: Every day | ORAL | 1 refills | Status: AC

## 2024-09-02 NOTE — Discharge Summary (Signed)
 [name] FMW:[id number] DOB: 19-Nov-1965 DOA: 08/30/2024  PCP: Franchot Isaiah LABOR, MD  Admit date: 08/30/2024 Discharge date: 09/02/2024  Time spent: 35 minutes  Recommendations for Outpatient Follow-up:  Wound clinic f/u 1/5 as scheduled Pcp f/u 1 week as well     Discharge Diagnoses:  Principal Problem:   Complicated wound infection Active Problems:   ESRD on dialysis (HCC)   Complete heart block (HCC)   Type 2 diabetes mellitus with end-stage renal disease (HCC)   HFrEF (heart failure with reduced ejection fraction) (HCC)   PAD (peripheral artery disease)   Calciphylaxis   Asthma   Discharge Condition: improving  Diet recommendation: low sodium heart healthy  Filed Weights   09/01/24 0746 09/01/24 1206 09/02/24 0500  Weight: 79.9 kg 77.2 kg 77.2 kg    History of present illness:  From admission h and p [name] is a 59 y.o. 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  Patient states she has been having worsening pain and redness on her bilateral lower extremities despite taking her pain medication. On arrival to the ED patient was noted to be HDS stable.  Lab work obtained.  CBC without leukocytosis, mild anemia that is borderline macrocytic.  CMP with ESRD changes along with moderate hyperglycemia, ALP elevation, and chronic anion gap metabolic acidosis.  ESR checked and elevated at 63.  Blood cultures ordered, wound cultures ordered.  Wound cultures were obtained by wound center.  Plain radiography of the lower extremities shows right x-ray showing changes consistent with chronic osteomyelitis along with diffuse soft tissue edema with left x-ray showing diffuse subcutaneous edema but no gas formation.   Hospital Course:   # Calciphylaxis # Cellulitis With x ray showing possible osteo on the  right. Inflammatory markers are elevated. Non-con MRI no signs osteomyelitis or abscess. Treated with vanc/cefepime  initially, transitioned to keflex  on hospital day 1. Clinically improving. Superficial swab growing multiple organisms. Given improvement suspect many are colonizers. - complete course keflex  - home health wound care ordered - f/u outpt wound care clinic 1/5 as scheduled - assuming infection continues to respond to keflex , would complete course. If it fails to continue to adequately respond, sensitivities (currently pending) will help guide changes to antibiotics - gabapentin  added to pain control regimen by nephrology   # PAD - vascular following, not advising additional vascular w/u for the time being - cont home pentoxifylline    # ESRD [facility] hemodialysis - nephro consulted, maintained outpt dialysis schedule    # T1DM controlled   # CHB S/p PPM   # HFrecoveredEF Euvolemic - volume w/ dialysis  Procedures: hemodialysis   Consultations: Wound care Nephrology  Discharge Exam: Vitals:   09/02/24 0600 09/02/24 0816  BP: 100/66 98/60  Pulse: 88 85  Resp: 20 18  Temp:  97.9 F (36.6 C)  SpO2: 100% 96%    General: NAD Cardiovascular: RRR Respiratory: CTAB Ext: b/l lower extremity ulcers with surrounding erythema, interval improvement  Discharge Instructions   Discharge Instructions     Diet - low sodium heart healthy   Complete by: As directed    Discharge wound care:   Complete by: As directed    1.  Cleanse all toe/foot wounds with Vashe, do not rinse.  Apply Xeroform gauze (Lawson [phone]) to wound beds daily, cover with dry gauze/ABD pads and secure with Kerlix  roll gauze.  Change dressing daily and as needed for drainage saturation.  2. Cleanse B lower leg wounds with Vashe, do not rinse.  Apply calcium  alginate (Lawson #[id number]) to wound beds daily, cover with ABD pads and secure with Kerlix roll gauze beginning right above toes and ending below  knees.  May apply Ace bandage for light compression if desired.  Change dressings daily and as needed for dressing saturation.   Increase activity slowly   Complete by: As directed       Allergies as of 09/02/2024       Reactions   Enalapril Hives, Other (See Comments)   Angioedema face.   Ivp Dye [iodinated Contrast Media] Hives   Lisinopril Shortness Of Breath   Prednisone  Shortness Of Breath        Medication List     TAKE these medications    albuterol  108 (90 Base) MCG/ACT inhaler Commonly known as: VENTOLIN  HFA Inhale 2 puffs into the lungs every 6 (six) hours as needed for wheezing or shortness of breath.   cephALEXin  500 MG capsule Commonly known as: KEFLEX  Take 1 capsule (500 mg total) by mouth 4 (four) times daily for 5 days.   Dexcom G7 Sensor Misc Use to check blood sugar as needed for insulin  dependent type 2 diabetes   Dexcom G7 Sensor Misc Use to monitor blood sugar   Dupilumab 300 MG/2ML Soaj Inject 300 mg into the skin.   gabapentin  100 MG capsule Commonly known as: NEURONTIN  Take 1 capsule (100 mg total) by mouth at bedtime.   ipratropium-albuterol  0.5-2.5 (3) MG/3ML Soln Commonly known as: DUONEB Take 3 mLs by nebulization 4 (four) times daily.   naloxone  4 MG/0.1ML Liqd nasal spray kit Commonly known as: NARCAN  Place 1 spray into the nose as needed for up to 365 doses (for opioid-induced respiratory depresssion). In case of emergency (overdose), spray once into each nostril. If no response within 3 minutes, repeat application and call 911.   NovoLOG  FlexPen 100 UNIT/ML FlexPen Generic drug: insulin  aspart Inject 14 Units into the skin 3 (three) times daily with meals.   oxyCODONE  5 MG immediate release tablet Commonly known as: Oxy IR/ROXICODONE  Take 1 tablet (5 mg total) by mouth every 12 (twelve) hours as needed for severe pain (pain score 7-10). Must last 30 days.   pentoxifylline  400 MG CR tablet Commonly known as: TRENTAL  Take 1  tablet (400 mg total) by mouth daily.   RENA-VITE PO Take 1 tablet by mouth daily.   sevelamer  carbonate 800 MG tablet Commonly known as: RENVELA  Take 2,400 mg by mouth 3 (three) times daily with meals.   SODIUM THIOSULFATE  IV Inject 12.5 mg into the vein 3 (three) times a week.               Discharge Care Instructions  (From admission, onward)           Start     Ordered   09/02/24 0000  Discharge wound care:       Comments: 1.  Cleanse all toe/foot wounds with Vashe, do not rinse.  Apply Xeroform gauze (Lawson [phone]) to wound beds daily, cover with dry gauze/ABD pads and secure with Kerlix roll gauze.  Change dressing daily and as needed for drainage saturation.  2. Cleanse B lower leg wounds with Vashe, do not rinse.  Apply calcium  alginate (Lawson [phone]) to wound beds daily, cover with ABD pads and secure with Kerlix roll gauze beginning right above toes and ending below  knees.  May apply Ace bandage for light compression if desired.  Change dressings daily and as needed for dressing saturation.   09/02/24 1002           Allergies[1]  Contact information for follow-up providers     Franchot Isaiah LABOR, MD Follow up.   Specialty: Family Medicine Why: hospital follow up Contact information: [address] [place] [address] [phone]         wound care Follow up.   Why: on Monday 1/5 as scheduled             Contact information for after-discharge care     Home Medical Care     [facility]) .   Service: Home Health Services Contact information: [address] [place]  [address] [phone]                      The results of significant diagnostics from this hospitalization (including imaging, microbiology, ancillary and laboratory) are listed below for reference.    Significant Diagnostic Studies: MR TIBIA FIBULA LEFT WO CONTRAST Result Date:  08/31/2024 CLINICAL DATA:  Bilateral leg wounds.  Concern for osteomyelitis. EXAM: MRI OF LOWER LEFT EXTREMITY WITHOUT CONTRAST TECHNIQUE: Multiplanar, multisequence MR imaging of the left tibia and fibula was performed. No intravenous contrast was administered. COMPARISON:  Radiographs dated 08/30/2024. FINDINGS: Bones/Joint/Cartilage Patchy serpiginous heterogenous signal abnormality in the proximal tibial diaphysis is most compatible with osteonecrosis. Fibula demonstrates normal marrow signal intensity. No convincing evidence of acute osteomyelitis. No fracture. Muscles and Tendons Generalized fatty atrophy with increased T2 signal of the posterior greater than anterior compartment calf musculature, likely reflecting chronic denervation changes, however, myositis is not excluded. No intramuscular collection. Intramuscular and intermuscular fat planes are maintained. Visualized patellar tendon is intact. Soft tissue Cutaneous wounds and irregularity of the anterior mid to distal shin. Diffuse cutaneous thickening and subcutaneous edema of the left calf. No loculated fluid collection. Findings are compatible with cellulitis in the appropriate setting. IMPRESSION: 1. No evidence of acute osteomyelitis. 2. Cutaneous wounds and irregularity of the anterior mid to distal shin. Diffuse cutaneous thickening and subcutaneous edema of the left calf. No loculated fluid collection. Findings are compatible with cellulitis in the appropriate setting. 3. Generalized fatty atrophy with increased T2 signal of the posterior greater than anterior compartment calf musculature, likely reflecting chronic denervation changes, however, myositis is not excluded. No intramuscular collection. 4. 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 Electronically Signed   By: Harrietta Sherry M.D.   On: 08/31/2024 16:49   MR TIBIA FIBULA RIGHT WO CONTRAST Result Date: 08/31/2024 CLINICAL DATA:  Bilateral leg wounds.   Concern for osteomyelitis. EXAM: MRI OF LOWER RIGHT EXTREMITY WITHOUT CONTRAST TECHNIQUE: Multiplanar, multisequence MR imaging of the right tibia and fibula was performed. No intravenous contrast was administered. 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 Fibula demonstrates normal marrow signal intensity. No convincing evidence of acute osteomyelitis. No fracture. Muscles and Tendons Generalized fatty atrophy with increased T2 signal of the anterior and posterior compartment calf musculature, likely reflecting chronic denervation changes, however, myositis is not excluded. No intramuscular collection. Intramuscular and intermuscular fat planes are maintained. Visualized patellar tendon is intact. Soft tissue Cutaneous wounds and irregularity of the mid to distal shin. Diffuse cutaneous thickening and subcutaneous edema of the right  calf. No loculated fluid collection. Findings are compatible with cellulitis in the appropriate setting. IMPRESSION: 1. No evidence of acute osteomyelitis. 2. Cutaneous wounds and irregularity of the mid to distal shin. Diffuse cutaneous thickening and subcutaneous edema of the right calf. No loculated fluid collection. Findings are compatible with cellulitis in the appropriate setting. 3. Generalized fatty atrophy with increased T2 signal of the anterior and posterior compartment calf musculature, likely reflecting chronic denervation changes, however, myositis is not excluded. No intramuscular collection. 4. 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 Electronically Signed   By: Harrietta Sherry M.D.   On: 08/31/2024 16:43   DG Chest Port 1 View Result Date: 08/31/2024 CLINICAL DATA:  Pacemaker. Technologist notes state complicated wound infection. EXAM: PORTABLE CHEST 1 VIEW COMPARISON:   Radiographs 03/22/2024 FINDINGS: Lead less pacemaker projects over the left mediastinum. Right-sided dialysis catheter tip in the right atrium. Overall low lung volumes. Cardiomegaly is stable. Aortic atherosclerosis. No pulmonary edema. No confluent airspace disease. No pleural fluid or pneumothorax. Grossly stable osseous structures. IMPRESSION: 1. Lead less pacemaker projects over the left mediastinum. 2. Stable cardiomegaly. Electronically Signed   By: Andrea Gasman M.D.   On: 08/31/2024 10:21   DG Tibia/Fibula Right Result Date: 08/30/2024 EXAM: 2 VIEW(S) XRAY OF THE RIGHT TIBIA AND FIBULA 08/30/2024 05:40:32 PM COMPARISON: None available. CLINICAL HISTORY: Complicated wound infection. FINDINGS: BONES AND JOINTS: There is some periosteal reaction along the mid and proximal diaphysis of the fibula. No cortical erosions are identified. No acute fracture. No malalignment. SOFT TISSUES: Diffuse soft tissue edema. Vascular calcifications and scattered phleboliths noted. IMPRESSION: 1. Periosteal reaction along the mid and proximal diaphysis of the fibula, with no cortical erosions identified. Findings can be seen in the setting of chronic osteomyelitis . 2. Diffuse soft tissue edema. Electronically signed by: Greig Pique MD 08/30/2024 06:55 PM EST RP Workstation: [id number]   DG Tibia/Fibula Left Result Date: 08/30/2024 CLINICAL DATA:  Wound infection. EXAM: DG TIBIA/FIBULA 2V*L* COMPARISON:  Radiograph dated 03/22/2024. FINDINGS: No acute fracture or dislocation. The bones are osteopenic. There is diffuse subcutaneous edema and scattered areas of subcutaneous calcification suggestive of chronic venous stasis. No soft tissue gas. IMPRESSION: 1. No acute fracture or dislocation. 2. Diffuse subcutaneous edema.  No soft tissue gas. Electronically Signed   By: Vanetta Chou M.D.   On: 08/30/2024 18:52    Microbiology: Recent Results (from the past 240 hours)  Blood Culture (routine x 2)     Status:  None (Preliminary result)   Collection Time: 08/30/24  5:21 PM   Specimen: BLOOD  Result Value Ref Range Status   Specimen Description BLOOD BLOOD RIGHT HAND  Final   Special Requests   Final    BOTTLES DRAWN AEROBIC AND ANAEROBIC Blood Culture results may not be optimal due to an inadequate volume of blood received in culture bottles   Culture   Final    NO GROWTH 3 DAYS Performed at [facility], [address] [place], [place] [address]    Report Status PENDING  Incomplete  Blood Culture (routine x 2)     Status: None (Preliminary result)   Collection Time: 08/30/24  5:25 PM   Specimen: BLOOD  Result Value Ref Range Status   Specimen Description BLOOD RIGHT ANTECUBITAL  Final   Special Requests   Final    BOTTLES DRAWN AEROBIC AND ANAEROBIC Blood Culture adequate volume   Culture   Final  NO GROWTH 3 DAYS Performed at [facility], [address] [place], [place] [address]    Report Status PENDING  Incomplete  Aerobic Culture w Gram Stain (superficial specimen)     Status: None (Preliminary result)   Collection Time: 08/31/24  1:14 AM   Specimen: Leg; Wound  Result Value Ref Range Status   Specimen Description   Final    LEG Performed at [facility], [address], [place], [place] [address]    Special Requests   Final    NONE Performed at [facility], [address] [place], [place] [address]    Gram Stain   Final    NO WBC SEEN FEW GRAM NEGATIVE RODS RARE GRAM POSITIVE RODS RARE GRAM POSITIVE COCCI    Culture   Final    ABUNDANT PSEUDOMONAS ALCALIGENES FEW ENTEROCOCCUS FAECALIS ABUNDANT STENOTROPHOMONAS MALTOPHILIA CULTURE REINCUBATED FOR BETTER GROWTH Performed at [facility], [address], [place], [place] [address]    Report Status PENDING  Incomplete     Labs: Basic Metabolic Panel: Recent Labs  Lab 08/30/24 1348 08/31/24 0540 09/01/24 0605  NA 141 139 138  K 3.7 3.9 4.2  CL 92* 93* 92*   CO2 21* 19* 16*  GLUCOSE 150* 186* 130*  BUN 12 17 23*  CREATININE 2.53* 3.16* 4.39*  CALCIUM  9.9 9.8 10.3  MG 2.0  --   --    Liver Function Tests: Recent Labs  Lab 08/30/24 1348  AST 15  ALT <5  ALKPHOS 230*  BILITOT 0.6  PROT 7.0  ALBUMIN  3.9   No results for input(s): LIPASE, AMYLASE in the last 168 hours. No results for input(s): AMMONIA in the last 168 hours. CBC: Recent Labs  Lab 08/30/24 1348 08/31/24 0540  WBC 7.6 8.5  NEUTROABS 6.5  --   HGB 11.7* 10.5*  HCT 38.7 34.5*  MCV 100.8* 101.8*  PLT 292 257   Cardiac Enzymes: No results for input(s): CKTOTAL, CKMB, CKMBINDEX, TROPONINI in the last 168 hours. BNP: BNP (last 3 results) Recent Labs    03/19/24 2233 05/02/24 1120  BNP 2,966.4* 2,615.5*    ProBNP (last 3 results) No results for input(s): PROBNP in the last 8760 hours.  CBG: Recent Labs  Lab 08/31/24 2330 09/01/24 1225 09/01/24 1556 09/01/24 2041 09/02/24 0818  GLUCAP 163* 138* 235* 220* 145*       Signed:  Devaughn KATHEE Ban MD.  [facility] 09/02/2024, 10:06 AM     [1]  Allergies Allergen Reactions   Enalapril Hives and Other (See Comments)    Angioedema face.   Ivp Dye [Iodinated Contrast Media] Hives   Lisinopril Shortness Of Breath   Prednisone  Shortness Of Breath

## 2024-09-02 NOTE — Progress Notes (Addendum)
 " [facility]  ROUNDING NOTE   Subjective:   [name] s a 59 year old female with past medical conditions including anemia, GERD, hypertension, stroke, diabetes, and end-stage renal disease on hemodialysis. Patient presents to the emergency department for evaluation of lower extremity wound. She has been admitted for Bilateral cellulitis of lower leg [L03.116, L03.115] Complicated wound infection [T14.8XXA, L08.9]  Patient is known to our practice and receives outpatient dialysis treatments at [facility] MWF schedule, supervised by Dr. Marcelino.  Patient seen sitting up in bed Primary team at bedside reviewing discharge plans.    Objective:  Vital signs in last 24 hours:  Temp:  [97.4 F (36.3 C)-98.6 F (37 C)] 97.9 F (36.6 C) (01/03 0816) Pulse Rate:  [84-88] 85 (01/03 0816) Resp:  [15-22] 18 (01/03 0816) BP: (85-105)/(50-67) 98/60 (01/03 0816) SpO2:  [93 %-100 %] 96 % (01/03 0816) Weight:  [77.2 kg] 77.2 kg (01/03 0500)  Weight change: -3.1 kg Filed Weights   09/01/24 0746 09/01/24 1206 09/02/24 0500  Weight: 79.9 kg 77.2 kg 77.2 kg    Intake/Output: I/O last 3 completed shifts: In: 240 [P.O.:240] Out: 2500 [Other:2500]   Intake/Output this shift:  Total I/O In: 240 [P.O.:240] Out: -   Physical Exam: General: NAD  Head: Normocephalic, atraumatic. Moist oral mucosal membranes  Eyes: Anicteric  Lungs:  Clear to auscultation  Heart: Regular rate and rhythm  Abdomen:  Soft, nontender  Extremities:  No peripheral edema.  Neurologic: Awake, alert, conversant  Skin: BLE ace bandages  Access: Rt permcath    Basic Metabolic Panel: Recent Labs  Lab 08/30/24 1348 08/31/24 0540 09/01/24 0605  NA 141 139 138  K 3.7 3.9 4.2  CL 92* 93* 92*  CO2 21* 19* 16*  GLUCOSE 150* 186* 130*  BUN 12 17 23*  CREATININE 2.53* 3.16* 4.39*  CALCIUM  9.9 9.8 10.3  MG 2.0  --   --     Liver Function Tests: Recent Labs  Lab 08/30/24 1348  AST 15   ALT <5  ALKPHOS 230*  BILITOT 0.6  PROT 7.0  ALBUMIN  3.9   No results for input(s): LIPASE, AMYLASE in the last 168 hours. No results for input(s): AMMONIA in the last 168 hours.  CBC: Recent Labs  Lab 08/30/24 1348 08/31/24 0540  WBC 7.6 8.5  NEUTROABS 6.5  --   HGB 11.7* 10.5*  HCT 38.7 34.5*  MCV 100.8* 101.8*  PLT 292 257    Cardiac Enzymes: No results for input(s): CKTOTAL, CKMB, CKMBINDEX, TROPONINI in the last 168 hours.  BNP: Invalid input(s): POCBNP  CBG: Recent Labs  Lab 08/31/24 2330 09/01/24 1225 09/01/24 1556 09/01/24 2041 09/02/24 0818  GLUCAP 163* 138* 235* 220* 145*    Microbiology: Results for orders placed or performed during the hospital encounter of 08/30/24  Blood Culture (routine x 2)     Status: None (Preliminary result)   Collection Time: 08/30/24  5:21 PM   Specimen: BLOOD  Result Value Ref Range Status   Specimen Description BLOOD BLOOD RIGHT HAND  Final   Special Requests   Final    BOTTLES DRAWN AEROBIC AND ANAEROBIC Blood Culture results may not be optimal due to an inadequate volume of blood received in culture bottles   Culture   Final    NO GROWTH 3 DAYS Performed at [facility], [address], [place], [place] [address]    Report Status PENDING  Incomplete  Blood Culture (routine x 2)  Status: None (Preliminary result)   Collection Time: 08/30/24  5:25 PM   Specimen: BLOOD  Result Value Ref Range Status   Specimen Description BLOOD RIGHT ANTECUBITAL  Final   Special Requests   Final    BOTTLES DRAWN AEROBIC AND ANAEROBIC Blood Culture adequate volume   Culture   Final    NO GROWTH 3 DAYS Performed at [facility], [address], [place], [place] [address]    Report Status PENDING  Incomplete  Aerobic Culture w Gram Stain (superficial specimen)     Status: None (Preliminary result)   Collection Time: 08/31/24  1:14 AM   Specimen: Leg; Wound  Result Value Ref Range Status    Specimen Description   Final    LEG Performed at [facility], [address], [place], [place] [address]    Special Requests   Final    NONE Performed at [facility] [place], [address] [place], [place] [address]    Gram Stain   Final    NO WBC SEEN FEW GRAM NEGATIVE RODS RARE GRAM POSITIVE RODS RARE GRAM POSITIVE COCCI    Culture   Final    ABUNDANT PSEUDOMONAS ALCALIGENES FEW ENTEROCOCCUS FAECALIS ABUNDANT STENOTROPHOMONAS MALTOPHILIA CULTURE REINCUBATED FOR BETTER GROWTH Performed at [facility], [address], [place], [place] [address]    Report Status PENDING  Incomplete    Coagulation Studies: No results for input(s): LABPROT, INR in the last 72 hours.  Urinalysis: No results for input(s): COLORURINE, LABSPEC, PHURINE, GLUCOSEU, HGBUR, BILIRUBINUR, KETONESUR, PROTEINUR, UROBILINOGEN, NITRITE, LEUKOCYTESUR in the last 72 hours.  Invalid input(s): APPERANCEUR    Imaging: MR TIBIA FIBULA LEFT WO CONTRAST Result Date: 08/31/2024 CLINICAL DATA:  Bilateral leg wounds.  Concern for osteomyelitis. EXAM: MRI OF LOWER LEFT EXTREMITY WITHOUT CONTRAST TECHNIQUE: Multiplanar, multisequence MR imaging of the left tibia and fibula was performed. No intravenous contrast was administered. COMPARISON:  Radiographs dated 08/30/2024. FINDINGS: Bones/Joint/Cartilage Patchy serpiginous heterogenous signal abnormality in the proximal tibial diaphysis is most compatible with osteonecrosis. Fibula demonstrates normal marrow signal intensity. No convincing evidence of acute osteomyelitis. No fracture. Muscles and Tendons Generalized fatty atrophy with increased T2 signal of the posterior greater than anterior compartment calf musculature, likely reflecting chronic denervation changes, however, myositis is not excluded. No intramuscular collection. Intramuscular and intermuscular fat planes are maintained. Visualized patellar tendon is intact. Soft  tissue Cutaneous wounds and irregularity of the anterior mid to distal shin. Diffuse cutaneous thickening and subcutaneous edema of the left calf. No loculated fluid collection. Findings are compatible with cellulitis in the appropriate setting. IMPRESSION: 1. No evidence of acute osteomyelitis. 2. Cutaneous wounds and irregularity of the anterior mid to distal shin. Diffuse cutaneous thickening and subcutaneous edema of the left calf. No loculated fluid collection. Findings are compatible with cellulitis in the appropriate setting. 3. Generalized fatty atrophy with increased T2 signal of the posterior greater than anterior compartment calf musculature, likely reflecting chronic denervation changes, however, myositis is not excluded. No intramuscular collection. 4. 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 Electronically Signed   By: Harrietta Sherry M.D.   On: 08/31/2024 16:49   MR TIBIA FIBULA RIGHT WO CONTRAST Result Date: 08/31/2024 CLINICAL DATA:  Bilateral leg wounds.  Concern for osteomyelitis. EXAM: MRI OF LOWER RIGHT EXTREMITY WITHOUT CONTRAST TECHNIQUE: Multiplanar, multisequence MR imaging of the right tibia and fibula was performed. No intravenous contrast was administered. COMPARISON:  Radiographs dated 08/30/2024 FINDINGS: Bones/Joint/Cartilage Osteonecrosis of the right  tibia extending from the level of the knee to the distal tibial diaphysis. Fibula demonstrates normal marrow signal intensity. No convincing evidence of acute osteomyelitis. No fracture. Muscles and Tendons Generalized fatty atrophy with increased T2 signal of the anterior and posterior compartment calf musculature, likely reflecting chronic denervation changes, however, myositis is not excluded. No intramuscular collection. Intramuscular and intermuscular fat planes are maintained. Visualized patellar tendon is intact. Soft tissue Cutaneous wounds and irregularity of the mid to distal shin. Diffuse  cutaneous thickening and subcutaneous edema of the right calf. No loculated fluid collection. Findings are compatible with cellulitis in the appropriate setting. IMPRESSION: 1. No evidence of acute osteomyelitis. 2. Cutaneous wounds and irregularity of the mid to distal shin. Diffuse cutaneous thickening and subcutaneous edema of the right calf. No loculated fluid collection. Findings are compatible with cellulitis in the appropriate setting. 3. Generalized fatty atrophy with increased T2 signal of the anterior and posterior compartment calf musculature, likely reflecting chronic denervation changes, however, myositis is not excluded. No intramuscular collection. 4. 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 Electronically Signed   By: Harrietta Sherry M.D.   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  female with past medical conditions including anemia, GERD, hypertension, stroke, diabetes, and end-stage renal disease on hemodialysis. Patient presents to the emergency department for wound evaluation.   [facility]/MWF/Rt permcath  End-stage renal disease on hemodialysis.  Received dialysis yesterday with UF 2.5 L achieved.  Next treatment scheduled for Monday.  2.  Cellulitis, BLE.  X-ray  showing possible osteomyelitis on right lower extremity, this has been ruled out.  Has received IV vancomycin  and cefazolin . Does receive sodium thiosulfate  during dialysis.  Pain management per primary team however we agree with gabapentin  100 mg nightly.  3. Anemia of chronic kidney disease Lab Results  Component Value Date   HGB 10.5 (L) 08/31/2024    Hemoglobin within optimal range for renal patient.  No need for ESA's during this time.  4. Secondary Hyperparathyroidism: with outpatient labs: None available currently  Lab Results  Component Value Date   CALCIUM  10.3 09/01/2024   CAION 1.00 (L) 03/09/2024   PHOS 3.3 05/01/2024    Calcium  remains acceptable.   LOS: 2 Trachelle Low 1/3/202611:27 AM   "

## 2024-09-04 ENCOUNTER — Telehealth: Payer: Self-pay

## 2024-09-04 ENCOUNTER — Encounter: Attending: Physician Assistant

## 2024-09-04 DIAGNOSIS — L97522 Non-pressure chronic ulcer of other part of left foot with fat layer exposed: Secondary | ICD-10-CM | POA: Insufficient documentation

## 2024-09-04 DIAGNOSIS — I129 Hypertensive chronic kidney disease with stage 1 through stage 4 chronic kidney disease, or unspecified chronic kidney disease: Secondary | ICD-10-CM | POA: Insufficient documentation

## 2024-09-04 DIAGNOSIS — Z992 Dependence on renal dialysis: Secondary | ICD-10-CM | POA: Insufficient documentation

## 2024-09-04 DIAGNOSIS — N186 End stage renal disease: Secondary | ICD-10-CM | POA: Insufficient documentation

## 2024-09-04 DIAGNOSIS — L97822 Non-pressure chronic ulcer of other part of left lower leg with fat layer exposed: Secondary | ICD-10-CM | POA: Diagnosis not present

## 2024-09-04 DIAGNOSIS — L97812 Non-pressure chronic ulcer of other part of right lower leg with fat layer exposed: Secondary | ICD-10-CM | POA: Diagnosis not present

## 2024-09-04 DIAGNOSIS — E1122 Type 2 diabetes mellitus with diabetic chronic kidney disease: Secondary | ICD-10-CM | POA: Diagnosis not present

## 2024-09-04 DIAGNOSIS — E11622 Type 2 diabetes mellitus with other skin ulcer: Secondary | ICD-10-CM | POA: Insufficient documentation

## 2024-09-04 DIAGNOSIS — I87333 Chronic venous hypertension (idiopathic) with ulcer and inflammation of bilateral lower extremity: Secondary | ICD-10-CM | POA: Diagnosis present

## 2024-09-04 LAB — CULTURE, BLOOD (ROUTINE X 2)
Culture: NO GROWTH
Culture: NO GROWTH
Special Requests: ADEQUATE

## 2024-09-04 NOTE — Transitions of Care (Post Inpatient/ED Visit) (Signed)
 "  09/04/2024  Name: Belinda Schlichting MRN: [id number] DOB: 1966-08-05  Today's TOC FU Call Status: Today's TOC FU Call Status:: Successful TOC FU Call Completed TOC FU Call Complete Date: 09/04/24  Patient's Name and Date of Birth confirmed. Name, DOB  Transition Care Management Follow-up Telephone Call Date of Discharge: 09/02/24 Discharge Facility: [facility]) Type of Discharge: Inpatient Admission Primary Inpatient Discharge Diagnosis:: Complicated Wound Infection How have you been since you were released from the hospital?: Better Any questions or concerns?: Yes  Items Reviewed: Did you receive and understand the discharge instructions provided?: Yes Medications obtained,verified, and reconciled?: Yes (Medications Reviewed) Any new allergies since your discharge?: No Dietary orders reviewed?: Yes Type of Diet Ordered:: Low Sodium Heart Healthy; renal Do you have support at home?: Yes People in Home [RPT]: spouse Name of Support/Comfort Primary Source: [place]  Medications Reviewed Today: Medications Reviewed Today     Reviewed by Eilleen Richerd GRADE, RN (Registered Nurse) on 09/04/24 at [phone]  Med List Status: <None>   Medication Order Taking? Sig Documenting Provider Last Dose Status Informant  albuterol  (VENTOLIN  HFA) 108 (90 Base) MCG/ACT inhaler [id number] Yes Inhale 2 puffs into the lungs every 6 (six) hours as needed for wheezing or shortness of breath. [name], MD  Active Self  B Complex-C-Folic Acid  (RENA-VITE PO) [id number] Yes Take 1 tablet by mouth daily. [provider]  Active Self  cephALEXin  (KEFLEX ) 500 MG capsule [id number] Yes Take 1 capsule (500 mg total) by mouth 4 (four) times daily for 5 days. Wouk, Devaughn Sayres, MD  Active   Continuous Glucose Sensor (DEXCOM G7 SENSOR) [place] [id number] Yes Use to check blood sugar as needed for insulin  dependent type 2 diabetes Franchot Isaiah LABOR, MD  Active Self  Continuous Glucose  Sensor (DEXCOM G7 [place]) MISC [id number] Yes Use to monitor blood sugar Franchot Isaiah LABOR, MD  Active Self  Dupilumab 300 MG/2ML EMMANUEL [id number] Yes Inject 300 mg into the skin. [provider]  Active Self           Med Note LESLY, RICHERD GRADE Kitchens Sep 04, 2024  9:19 AM) Every other week per patient  gabapentin  (NEURONTIN ) 100 MG capsule [id number] Yes Take 1 capsule (100 mg total) by mouth at bedtime. Wouk, Devaughn Sayres, MD  Active   ipratropium-albuterol  (DUONEB) 0.5-2.5 (3) MG/3ML SOLN [id number] Yes Take 3 mLs by nebulization 4 (four) times daily.  Patient taking differently: Take 3 mLs by nebulization every 6 (six) hours as needed. Only as needed   [provider]  Active Self  naloxone  (NARCAN ) nasal spray 4 mg/0.1 mL [id number]  Place 1 spray into the nose as needed for up to 365 doses (for opioid-induced respiratory depresssion). In case of emergency (overdose), spray once into each nostril. If no response within 3 minutes, repeat application and call 911.  Patient not taking: Reported on 08/30/2024   Patel, Seema K, NP  Active Self  NOVOLOG  FLEXPEN 100 UNIT/ML FlexPen [id number] Yes Inject 14 Units into the skin 3 (three) times daily with meals. [provider]  Active Self  oxyCODONE  (OXY IR/ROXICODONE ) 5 MG immediate release tablet [id number] Yes Take 1 tablet (5 mg total) by mouth every 12 (twelve) hours as needed for severe pain (pain score 7-10). Must last 30 days. Patel, Seema K, NP  Active Self  pentoxifylline  (TRENTAL ) 400 MG CR tablet [id number]  Take 1 tablet (400 mg total) by mouth daily. Claudene Lehmann, MD  Active Self  Med Note LESLY, RICHERD CINDERELLA Kitchens Sep 04, 2024  9:27 AM) Patient to follow up with PCP regarding ongoing medication need  sevelamer  carbonate (RENVELA ) 800 MG tablet [id number] Yes Take 2,400 mg by mouth 3 (three) times daily with meals. [provider]  Active Self  SODIUM THIOSULFATE  IV [id number] Yes Inject 12.5 mg  into the vein 3 (three) times a week. [provider]  Active Self           Med Note LESLY, RICHERD CINDERELLA Kitchens Sep 04, 2024  9:21 AM) This is done at Dialysis per patient  Med List Note Lonna Doyal SAUN[place], [place] 08/10/24 1531): Dialysis MWF 08/10/2024 Pain contract signed  MR 09/10/2024 Approved Oxycodone  07/12/25-01/09/25 approval letter received for oxycodone  30 mg which is incorrect,  PA was for oxycodone  5 mg.  LP left message with [facility] to see if the medication will go through with that PA.            Home Care and Equipment/Supplies: Were Home Health Services Ordered?: Yes Name of Home Health Agency:: [facility] Has Agency set up a time to come to your home?: Yes First Home Health Visit Date: 09/02/24 Any new equipment or medical supplies ordered?: Yes Name of Medical supply agency?: Hospital sent - [facility] needs new orders - dressing Do you have any questions related to the use of the equipment/supplies?: No  Functional Questionnaire: Do you need assistance with bathing/showering or dressing?: No Do you need assistance with meal preparation?: Yes Do you need assistance with eating?: No Do you have difficulty maintaining continence: No Do you need assistance with getting out of bed/getting out of a chair/moving?: Yes (Husband assist if I need it due to cellulitis of both legs) Do you have difficulty managing or taking your medications?: No  Follow up appointments reviewed: PCP Follow-up appointment confirmed?: Yes Date of PCP follow-up appointment?: 09/05/24 Follow-up Provider: Isaiah Pepper, MD Specialist Hospital Follow-up appointment confirmed?: Yes Date of Specialist follow-up appointment?: 09/04/24 Follow-Up Specialty Provider:: Wound Clinic Do you need transportation to your follow-up appointment?: No Do you understand care options if your condition(s) worsen?: Yes-patient verbalized understanding  Education for self-mgmt of Bilateral LE cellulitis, complicated wound  provided during this outreach regarding: -s/s of worsening condition and when to seek medical attention -importance of completing all post discharge hospital hospital follow up appts -adherence to med regimen [facility] TOC 30-day program enrollment reviewed and discussed with pt/caregiver. They have declined enrollment in 30 day TOC program due to:  I am seeing medical people nearly every day and I'm doing better   Richerd Fish, RN, BSN, CCM [facility], [facility] Management Coordinator Direct Dial: [phone]        "

## 2024-09-05 ENCOUNTER — Ambulatory Visit

## 2024-09-06 NOTE — Progress Notes (Signed)
 PROVIDER NOTE: Interpretation of information contained herein should be left to medically-trained personnel. Specific patient instructions are provided elsewhere under Patient Instructions section of medical record. This document was created in part using AI and STT-dictation technology, any transcriptional errors that may result from this process are unintentional.  Patient: [name]  Service: E/M   PCP: Franchot Isaiah LABOR, MD  DOB: May 11, 1966  DOS: 09/07/2024  Provider: Emmy MARLA Blanch, NP  MRN: [id number]  Delivery: Face-to-face  Specialty: Interventional Pain Management  Type: Established Patient  Setting: Ambulatory outpatient facility  Specialty designation: 09  Referring Prov.: Franchot Isaiah LABOR, MD  Location: Outpatient office facility       History of present illness (HPI) [name], a 59 y.o. year old female, is here today because of her Pain in both lower extremities [M79.604, M79.605]. [name] primary complain today is Leg Pain  Pertinent problems: [name] has Stage 5 chronic renal impairment associated with type 2 diabetes mellitus (HCC); ESRD on dialysis [facility]); Diabetic peripheral neuropathy (HCC); History of fracture of left hip; Osteoporosis with current pathological fracture; Type 2 diabetes mellitus with end-stage renal disease (HCC); PAD (peripheral artery disease); and Chronic pain syndrome on their pertinent problem list.  Pain Assessment: Severity of Chronic pain is reported as a 4 /10. Location: Leg Right, Left/Denies. Onset: More than a month ago. Quality: Sharp. Timing: Intermittent. Modifying factor(s): Pain medication, changing the dressings. Vitals:  height is 5' 7 (1.702 m) and weight is 176 lb 5.9 oz (80 kg). Her temporal temperature is 98.2 F (36.8 C). Her blood pressure is 91/70 and her pulse is 86. Her respiration is 18 and oxygen saturation is 94%.  BMI: Estimated body mass index is 27.62 kg/m as calculated from the following:   Height as of this  encounter: 5' 7 (1.702 m).   Weight as of this encounter: 176 lb 5.9 oz (80 kg).  Last encounter: 08/10/2024. Last procedure: Visit date not found.  Reason for encounter: medication management. No change in medical history since last visit.  Patient's pain is at baseline.  Patient continues multimodal pain regimen as prescribed.  States that it provides pain relief and improvement in functional status.   Discussed the use of AI scribe software for clinical note transcription with the patient, who gave verbal consent to proceed.  History of Present Illness   [name] is a 59 year old female who presents for pain management following wound care and antibiotic treatment.  She has been experiencing significant pain related to a wound that required care at a wound clinic. The wound was infected and described as 'climbing up my leg', but hospitalization and intravenous antibiotics helped reduce the infection.  A skin culture was performed at the wound clinic, and the doctor explained that she could see which bacteria was bad and provided an antibiotic for that infection.  She was prescribed gabapentin  100 mg at night, which has significantly improved her pain management. She takes gabapentin  at 8 PM and follows it with one oxycodone  two hours later, which she finds effective for sleep. She attempted to manage with only gabapentin  but found it insufficient without the addition of oxycodone .     Pharmacotherapy Assessment   Opioid Analgesic: Oxycodone  Hcl (Ir) 5 Mg Tablet. MME=15 Monitoring: [place] PMP: PDMP reviewed during this encounter.       Pharmacotherapy: No side-effects or adverse reactions reported. Compliance: No problems identified. Effectiveness: Clinically acceptable.  [name], CMA  09/07/2024 10:08 AM  Sign when Signing Visit Nursing Pain Medication Assessment:  Safety precautions to be maintained throughout the outpatient stay will include: orient to surroundings, keep bed  in low position, maintain call bell within reach at all times, provide assistance with transfer out of bed and ambulation.  Medication Inspection Compliance: Pill count conducted under aseptic conditions, in front of the patient. Neither the pills nor the bottle was removed from the patient's sight at any time. Once count was completed pills were immediately returned to the patient in their original bottle.  Medication: Oxycodone  IR Pill/Patch Count: 2 of 60 pills/patches remain Pill/Patch Appearance: Markings consistent with prescribed medication Bottle Appearance: Standard pharmacy container. Clearly labeled. Filled Date: 13 / 12 / 2025 Last Medication intake:  Yesterday    UDS:  No results found for: SUMMARY  No results found for: CBDTHCR No results found for: D8THCCBX No results found for: D9THCCBX  ROS  Constitutional: Denies any fever or chills Gastrointestinal: No reported hemesis, hematochezia, vomiting, or acute GI distress Musculoskeletal: Bilateral lower extremity pain Neurological: No reported episodes of acute onset apraxia, aphasia, dysarthria, agnosia, amnesia, paralysis, loss of coordination, or loss of consciousness  Medication Review  B Complex-C-Folic Acid , Dexcom G7 Sensor, Dupilumab, Sodium Thiosulfate , albuterol , cephALEXin , gabapentin , insulin  aspart, ipratropium-albuterol , naloxone , oxyCODONE , pentoxifylline , and sevelamer  carbonate  History Review  Allergy: [name] is allergic to enalapril, ivp dye [iodinated contrast media], lisinopril, and prednisone . Drug: [name]  reports no history of drug use. Alcohol:  reports no history of alcohol use. Tobacco:  reports that she has never smoked. She has never used smokeless tobacco. Social: [name]  reports that she has never smoked. She has never used smokeless tobacco. She reports that she does not drink alcohol and does not use drugs. Medical:  has a past medical history of Allergy, Anemia, Anxiety,  Asthma, Calciphylaxis, Chronic kidney disease, Diabetes mellitus without complication (HCC), GERD (gastroesophageal reflux disease), Heart murmur, High serum parathyroid hormone (PTH), History of kidney stones (2000), Hypertension, Neuromuscular disorder (HCC), Osteoporosis, and PONV (postoperative nausea and vomiting). Surgical: [name]  has a past surgical history that includes Shoulder Closed Reduction (Right, 2004); Rotator cuff repair (Right, 2004); Abdominal hysterectomy (2007); AV Fistula Insertion w/RF Magnetic Guidance (N/A, 12/08/2017); UPPER EXTREMITY VENOGRAPHY (Left, 02/15/2018); DIALYSIS/PERMA CATHETER INSERTION (Right, 10/03/2021); Hip Arthroplasty (Left, 09/29/2021); Quadriceps tendon repair (Right, 10/16/2021); Incision and drainage hip (Left, 11/15/2021); Application if wound vac (Left, 11/15/2021); Hip Arthroplasty (Right, 12/14/2021); Application if wound vac (Right, 12/14/2021); and PACEMAKER LEADLESS INSERTION (N/A, 03/09/2024). Family: family history includes Alcohol abuse in her brother; COPD in her mother; Cancer in her father and paternal aunt; Cardiomyopathy in her brother; Diabetes in her brother and sister; Eczema in her sister; Emphysema in her mother; Hypertension in her brother and sister.  Laboratory Chemistry Profile   Renal Lab Results  Component Value Date   BUN 23 (H) 09/01/2024   CREATININE 4.39 (H) 09/01/2024   GFRAA 8 (L) 12/06/2017   GFRNONAA 11 (L) 09/01/2024    Hepatic Lab Results  Component Value Date   AST 15 08/30/2024   ALT <5 08/30/2024   ALBUMIN  3.9 08/30/2024   ALKPHOS 230 (H) 08/30/2024   HCVAB NON REACTIVE 05/03/2024    Electrolytes Lab Results  Component Value Date   NA 138 09/01/2024   K 4.2 09/01/2024   CL 92 (L) 09/01/2024   CALCIUM  10.3 09/01/2024   MG 2.0 08/30/2024   PHOS 3.3 05/01/2024    Bone Lab Results  Component Value Date  VD25OH 23.67 (L) 09/29/2021    Inflammation (CRP: Acute Phase) (ESR: Chronic Phase) Lab Results   Component Value Date   CRP 10.6 (H) 09/02/2024   ESRSEDRATE 63 (H) 08/30/2024   LATICACIDVEN 2.2 (HH) 08/31/2024         Note: Above Lab results reviewed.  Recent Imaging Review  MR TIBIA FIBULA LEFT WO CONTRAST CLINICAL DATA:  Bilateral leg wounds.  Concern for osteomyelitis.  EXAM: MRI OF LOWER LEFT EXTREMITY WITHOUT CONTRAST  TECHNIQUE: Multiplanar, multisequence MR imaging of the left tibia and fibula was performed. No intravenous contrast was administered.  COMPARISON:  Radiographs dated 08/30/2024.  FINDINGS: Bones/Joint/Cartilage  Patchy serpiginous heterogenous signal abnormality in the proximal tibial diaphysis is most compatible with osteonecrosis. Fibula demonstrates normal marrow signal intensity. No convincing evidence of acute osteomyelitis. No fracture.  Muscles and Tendons  Generalized fatty atrophy with increased T2 signal of the posterior greater than anterior compartment calf musculature, likely reflecting chronic denervation changes, however, myositis is not excluded. No intramuscular collection. Intramuscular and intermuscular fat planes are maintained. Visualized patellar tendon is intact.  Soft tissue Cutaneous wounds and irregularity of the anterior mid to distal shin. Diffuse cutaneous thickening and subcutaneous edema of the left calf. No loculated fluid collection. Findings are compatible with cellulitis in the appropriate setting.  IMPRESSION: 1. No evidence of acute osteomyelitis. 2. Cutaneous wounds and irregularity of the anterior mid to distal shin. Diffuse cutaneous thickening and subcutaneous edema of the left calf. No loculated fluid collection. Findings are compatible with cellulitis in the appropriate setting. 3. Generalized fatty atrophy with increased T2 signal of the posterior greater than anterior compartment calf musculature, likely reflecting chronic denervation changes, however, myositis is not excluded. No  intramuscular collection. 4. 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  Electronically Signed   By: Harrietta Sherry M.D.   On: 08/31/2024 16:49 MR TIBIA FIBULA RIGHT WO CONTRAST CLINICAL DATA:  Bilateral leg wounds.  Concern for osteomyelitis.  EXAM: MRI OF LOWER RIGHT EXTREMITY WITHOUT CONTRAST  TECHNIQUE: Multiplanar, multisequence MR imaging of the right tibia and fibula was performed. No intravenous contrast was administered.  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 Fibula demonstrates normal marrow signal intensity. No convincing evidence of acute osteomyelitis. No fracture.  Muscles and Tendons  Generalized fatty atrophy with increased T2 signal of the anterior and posterior compartment calf musculature, likely reflecting chronic denervation changes, however, myositis is not excluded. No intramuscular collection. Intramuscular and intermuscular fat planes are maintained. Visualized patellar tendon is intact.  Soft tissue Cutaneous wounds and irregularity of the mid to distal shin. Diffuse cutaneous thickening and subcutaneous edema of the right calf. No loculated fluid collection. Findings are compatible with cellulitis in the appropriate setting.  IMPRESSION: 1. No evidence of acute osteomyelitis. 2. Cutaneous wounds and irregularity of the mid to distal shin. Diffuse cutaneous thickening and subcutaneous edema of the right calf. No loculated fluid collection. Findings are compatible with cellulitis in the appropriate setting. 3. Generalized fatty atrophy with increased T2 signal of the anterior and posterior compartment calf musculature, likely reflecting chronic denervation changes, however, myositis is not excluded. No intramuscular collection. 4. 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  Electronically Signed   By: Harrietta Sherry M.D.   On: 08/31/2024 16:43 DG Chest Port 1 View CLINICAL  DATA:  Pacemaker. Technologist notes state complicated wound infection.  EXAM: PORTABLE CHEST 1 VIEW  COMPARISON:  Radiographs 03/22/2024  FINDINGS: Lead less pacemaker projects over the left mediastinum. Right-sided dialysis catheter tip in the right atrium. Overall low lung volumes. Cardiomegaly is stable. Aortic atherosclerosis. No pulmonary edema. No confluent airspace disease. No pleural fluid or pneumothorax. Grossly stable osseous structures.  IMPRESSION: 1. Lead less pacemaker projects over the left mediastinum. 2. Stable cardiomegaly.  Electronically Signed   By: Andrea Gasman M.D.   On: 08/31/2024 10:21 Note: Reviewed        Physical Exam  Vitals: BP 91/70 (BP Location: Right Arm, Patient Position: Sitting, Cuff Size: Normal)   Pulse 86   Temp 98.2 F (36.8 C) (Temporal)   Resp 18   Ht 5' 7 (1.702 m)   Wt 176 lb 5.9 oz (80 kg)   SpO2 94%   BMI 27.62 kg/m  BMI: Estimated body mass index is 27.62 kg/m as calculated from the following:   Height as of this encounter: 5' 7 (1.702 m).   Weight as of this encounter: 176 lb 5.9 oz (80 kg). Ideal: Ideal body weight: 61.6 kg (135 lb 12.9 oz) Adjusted ideal body weight: 69 kg (152 lb 0.5 oz) General appearance: Well nourished, well developed, and well hydrated. In no apparent acute distress Mental status: Alert, oriented x 3 (person, place, & time)       Respiratory: No evidence of acute respiratory distress Eyes: PERLA  Musculoskeletal: Bilateral extremity pain Assessment   Diagnosis Status  1. Pain in both lower extremities   2. Polyarthritis   3. Diabetic peripheral neuropathy (HCC)   4. Medication management   5. Chronic pain syndrome   6. Pain management contract discussed   7. Pain management contract signed     Controlled Controlled Controlled   Updated Problems: No problems updated.  Plan of Care  Problem-specific:  Assessment and Plan    Diabetic peripheral neuropathy with chronic lower extremity pain Chronic pain managed with gabapentin  and oxycodone . Current regimen effective for pain control and sleep improvement. - Continue gabapentin  100 mg at night. - Continue oxycodone  as needed for breakthrough pain, particularly at night. - Sent prescriptions for gabapentin  and oxycodone  for three months to pharmacy.  Medication management for chronic pain and neuropathy Current regimen effective for chronic pain and neuropathy management. Gabapentin  and oxycodone  dosing schedule effective. - Continue current medication regimen with gabapentin  and oxycodone . - Monitor medication supply and refill as needed.  Patient's pain is controlled with oxycodone , continue on current medication regimen.  Prescribing drug monitoring (PDMP) reviewed, findings consistent with the use of prescribed medication and no evidence of narcotic misuse or abuse.  Urine drug screening (UDS) up to date.  The patient was advised to use continue MiraLAX  for opioid-induced constipation.  Schedule follow-up in 90 days for medication management.       [name] has a current medication list which includes the following long-term medication(s): albuterol , dexcom g7 sensor, dexcom g7 sensor, gabapentin , and ipratropium-albuterol .  Pharmacotherapy (Medications Ordered): Meds ordered this encounter  Medications   oxyCODONE  (OXY IR/ROXICODONE ) 5 MG immediate release tablet    Sig: Take 1 tablet (5 mg total) by mouth every 12 (twelve) hours as needed for severe pain (pain score 7-10). Must last 30 days.    Dispense:  60 tablet    Refill:  0    Chronic Pain: STOP Act (Not applicable) Fill 1 day early if closed on refill  date. Avoid benzodiazepines within 8 hours of opioids   oxyCODONE  (OXY IR/ROXICODONE ) 5 MG immediate  release tablet    Sig: Take 1 tablet (5 mg total) by mouth every 12 (twelve) hours as needed for severe pain (pain score 7-10). Must last 30 days.    Dispense:  60 tablet    Refill:  0    Chronic Pain: STOP Act (Not applicable) Fill 1 day early if closed on refill date. Avoid benzodiazepines within 8 hours of opioids   oxyCODONE  (OXY IR/ROXICODONE ) 5 MG immediate release tablet    Sig: Take 1 tablet (5 mg total) by mouth every 12 (twelve) hours as needed for severe pain (pain score 7-10). Must last 30 days.    Dispense:  60 tablet    Refill:  0    Chronic Pain: STOP Act (Not applicable) Fill 1 day early if closed on refill date. Avoid benzodiazepines within 8 hours of opioids   Orders:  No orders of the defined types were placed in this encounter.     Return in about 3 months (around 12/06/2024) for (F2F), (MM), Emmy Blanch NP.    Recent Visits Date Type Provider Dept  08/10/24 Office Visit Naszir Cott K, NP [facility]  07/10/24 Office Visit Corvette Orser K, NP [facility]  07/03/24 Office Visit Marcelino Nurse, MD [facility]  Showing recent visits within past 90 days and meeting all other requirements Today's Visits Date Type Provider Dept  09/07/24 Office Visit Ahren Pettinger K, NP [facility]  Showing today's visits and meeting all other requirements Future Appointments Date Type Provider Dept  11/27/24 Appointment Jakyron Fabro K, NP [facility]  Showing future appointments within next 90 days and meeting all other requirements  I discussed the assessment and treatment plan with the patient. The patient was provided an opportunity to ask questions and all were answered. The patient agreed with the plan and demonstrated an understanding of the instructions.  Patient advised to call back or seek an in-person evaluation if the symptoms or condition worsens.  I personally spent a total of 30 minutes in the care of the patient today  including preparing to see the patient, getting/reviewing separately obtained history, performing a medically appropriate exam/evaluation, counseling and educating, placing orders, referring and communicating with other health care professionals, documenting clinical information in the EHR, independently interpreting results, communicating results, and coordinating care.   Note by: Tajuanna Burnett K Rifky Lapre, NP (TTS and AI technology used. I apologize for any typographical errors that were not detected and corrected.) Date: 09/07/2024; Time: 11:20 AM

## 2024-09-07 ENCOUNTER — Ambulatory Visit: Attending: Nurse Practitioner | Admitting: Nurse Practitioner

## 2024-09-07 ENCOUNTER — Encounter

## 2024-09-07 ENCOUNTER — Encounter: Payer: Self-pay | Admitting: Nurse Practitioner

## 2024-09-07 VITALS — BP 91/70 | HR 86 | Temp 98.2°F | Resp 18 | Ht 67.0 in | Wt 176.4 lb

## 2024-09-07 DIAGNOSIS — M130 Polyarthritis, unspecified: Secondary | ICD-10-CM | POA: Diagnosis present

## 2024-09-07 DIAGNOSIS — Z79899 Other long term (current) drug therapy: Secondary | ICD-10-CM | POA: Diagnosis present

## 2024-09-07 DIAGNOSIS — Z7189 Other specified counseling: Secondary | ICD-10-CM | POA: Insufficient documentation

## 2024-09-07 DIAGNOSIS — M79605 Pain in left leg: Secondary | ICD-10-CM | POA: Diagnosis present

## 2024-09-07 DIAGNOSIS — Z0289 Encounter for other administrative examinations: Secondary | ICD-10-CM | POA: Insufficient documentation

## 2024-09-07 DIAGNOSIS — E1142 Type 2 diabetes mellitus with diabetic polyneuropathy: Secondary | ICD-10-CM | POA: Insufficient documentation

## 2024-09-07 DIAGNOSIS — Z794 Long term (current) use of insulin: Secondary | ICD-10-CM | POA: Diagnosis not present

## 2024-09-07 DIAGNOSIS — G894 Chronic pain syndrome: Secondary | ICD-10-CM | POA: Insufficient documentation

## 2024-09-07 DIAGNOSIS — M79604 Pain in right leg: Secondary | ICD-10-CM | POA: Diagnosis present

## 2024-09-07 DIAGNOSIS — I87333 Chronic venous hypertension (idiopathic) with ulcer and inflammation of bilateral lower extremity: Secondary | ICD-10-CM | POA: Diagnosis not present

## 2024-09-07 MED ORDER — OXYCODONE HCL 5 MG PO TABS: 5.0000 mg | ORAL_TABLET | Freq: Two times a day (BID) | ORAL | 0 refills | Status: DC | PRN

## 2024-09-07 NOTE — Progress Notes (Signed)
 Nursing Pain Medication Assessment:  Safety precautions to be maintained throughout the outpatient stay will include: orient to surroundings, keep bed in low position, maintain call bell within reach at all times, provide assistance with transfer out of bed and ambulation.  Medication Inspection Compliance: Pill count conducted under aseptic conditions, in front of the patient. Neither the pills nor the bottle was removed from the patient's sight at any time. Once count was completed pills were immediately returned to the patient in their original bottle.  Medication: Oxycodone  IR Pill/Patch Count: 2 of 60 pills/patches remain Pill/Patch Appearance: Markings consistent with prescribed medication Bottle Appearance: Standard pharmacy container. Clearly labeled. Filled Date: 81 / 12 / 2025 Last Medication intake:  Yesterday

## 2024-09-07 NOTE — Patient Instructions (Signed)

## 2024-09-08 ENCOUNTER — Encounter: Payer: Self-pay | Admitting: Nurse Practitioner

## 2024-09-10 LAB — SUSCEPTIBILITY RESULT

## 2024-09-10 LAB — AEROBIC CULTURE W GRAM STAIN (SUPERFICIAL SPECIMEN): Gram Stain: NONE SEEN

## 2024-09-10 LAB — SUSCEPTIBILITY, AER + ANAEROB

## 2024-09-11 ENCOUNTER — Telehealth: Payer: Self-pay

## 2024-09-11 ENCOUNTER — Encounter: Admitting: Physician Assistant

## 2024-09-11 DIAGNOSIS — Z992 Dependence on renal dialysis: Secondary | ICD-10-CM | POA: Diagnosis not present

## 2024-09-11 DIAGNOSIS — I87333 Chronic venous hypertension (idiopathic) with ulcer and inflammation of bilateral lower extremity: Secondary | ICD-10-CM | POA: Diagnosis not present

## 2024-09-11 DIAGNOSIS — N186 End stage renal disease: Secondary | ICD-10-CM | POA: Diagnosis not present

## 2024-09-11 DIAGNOSIS — E1351 Other specified diabetes mellitus with diabetic peripheral angiopathy without gangrene: Secondary | ICD-10-CM | POA: Diagnosis not present

## 2024-09-11 DIAGNOSIS — I110 Hypertensive heart disease with heart failure: Secondary | ICD-10-CM | POA: Diagnosis not present

## 2024-09-11 DIAGNOSIS — D631 Anemia in chronic kidney disease: Secondary | ICD-10-CM | POA: Diagnosis not present

## 2024-09-11 DIAGNOSIS — I442 Atrioventricular block, complete: Secondary | ICD-10-CM | POA: Diagnosis not present

## 2024-09-11 DIAGNOSIS — E785 Hyperlipidemia, unspecified: Secondary | ICD-10-CM | POA: Diagnosis not present

## 2024-09-11 DIAGNOSIS — J45909 Unspecified asthma, uncomplicated: Secondary | ICD-10-CM | POA: Diagnosis not present

## 2024-09-11 DIAGNOSIS — L03116 Cellulitis of left lower limb: Secondary | ICD-10-CM | POA: Diagnosis not present

## 2024-09-11 DIAGNOSIS — L03115 Cellulitis of right lower limb: Secondary | ICD-10-CM | POA: Diagnosis not present

## 2024-09-11 DIAGNOSIS — I5022 Chronic systolic (congestive) heart failure: Secondary | ICD-10-CM | POA: Diagnosis not present

## 2024-09-11 NOTE — Telephone Encounter (Signed)
 Please call representative back and let her know that I agree with the PT plan.

## 2024-09-11 NOTE — Telephone Encounter (Signed)
 Copied from CRM [id number]. Topic: General - Call Back - No Documentation >> Sep 11, 2024  8:42 AM Olam RAMAN wrote: Reason for CRM: pt was seen last week for PT will f/u with pt in about 2 weeks.  Amy with [facility] [phone]

## 2024-09-17 ENCOUNTER — Emergency Department

## 2024-09-17 ENCOUNTER — Other Ambulatory Visit: Payer: Self-pay

## 2024-09-17 ENCOUNTER — Inpatient Hospital Stay
Admission: EM | Admit: 2024-09-17 | Discharge: 2024-09-21 | DRG: 064 | Disposition: A | Discharge status: Skilled Nursing Facility | Attending: Internal Medicine | Admitting: Internal Medicine

## 2024-09-17 ENCOUNTER — Encounter: Payer: Self-pay | Admitting: Internal Medicine

## 2024-09-17 DIAGNOSIS — Z992 Dependence on renal dialysis: Secondary | ICD-10-CM

## 2024-09-17 DIAGNOSIS — Z515 Encounter for palliative care: Secondary | ICD-10-CM

## 2024-09-17 DIAGNOSIS — I639 Cerebral infarction, unspecified: Secondary | ICD-10-CM | POA: Diagnosis not present

## 2024-09-17 DIAGNOSIS — Z96643 Presence of artificial hip joint, bilateral: Secondary | ICD-10-CM | POA: Diagnosis present

## 2024-09-17 DIAGNOSIS — E872 Acidosis, unspecified: Secondary | ICD-10-CM | POA: Diagnosis present

## 2024-09-17 DIAGNOSIS — I081 Rheumatic disorders of both mitral and tricuspid valves: Secondary | ICD-10-CM | POA: Diagnosis present

## 2024-09-17 DIAGNOSIS — I2489 Other forms of acute ischemic heart disease: Secondary | ICD-10-CM | POA: Diagnosis present

## 2024-09-17 DIAGNOSIS — E1142 Type 2 diabetes mellitus with diabetic polyneuropathy: Secondary | ICD-10-CM | POA: Diagnosis present

## 2024-09-17 DIAGNOSIS — I272 Pulmonary hypertension, unspecified: Secondary | ICD-10-CM | POA: Diagnosis present

## 2024-09-17 DIAGNOSIS — M810 Age-related osteoporosis without current pathological fracture: Secondary | ICD-10-CM | POA: Diagnosis present

## 2024-09-17 DIAGNOSIS — I63412 Cerebral infarction due to embolism of left middle cerebral artery: Principal | ICD-10-CM | POA: Diagnosis present

## 2024-09-17 DIAGNOSIS — F419 Anxiety disorder, unspecified: Secondary | ICD-10-CM | POA: Diagnosis present

## 2024-09-17 DIAGNOSIS — N186 End stage renal disease: Secondary | ICD-10-CM | POA: Diagnosis not present

## 2024-09-17 DIAGNOSIS — N2581 Secondary hyperparathyroidism of renal origin: Secondary | ICD-10-CM | POA: Diagnosis present

## 2024-09-17 DIAGNOSIS — I951 Orthostatic hypotension: Secondary | ICD-10-CM | POA: Diagnosis present

## 2024-09-17 DIAGNOSIS — R4701 Aphasia: Principal | ICD-10-CM | POA: Diagnosis present

## 2024-09-17 DIAGNOSIS — R9431 Abnormal electrocardiogram [ECG] [EKG]: Secondary | ICD-10-CM | POA: Diagnosis not present

## 2024-09-17 DIAGNOSIS — I442 Atrioventricular block, complete: Secondary | ICD-10-CM | POA: Diagnosis present

## 2024-09-17 DIAGNOSIS — Z888 Allergy status to other drugs, medicaments and biological substances status: Secondary | ICD-10-CM

## 2024-09-17 DIAGNOSIS — Z87442 Personal history of urinary calculi: Secondary | ICD-10-CM

## 2024-09-17 DIAGNOSIS — E875 Hyperkalemia: Secondary | ICD-10-CM | POA: Diagnosis present

## 2024-09-17 DIAGNOSIS — Z833 Family history of diabetes mellitus: Secondary | ICD-10-CM

## 2024-09-17 DIAGNOSIS — Z993 Dependence on wheelchair: Secondary | ICD-10-CM

## 2024-09-17 DIAGNOSIS — R7989 Other specified abnormal findings of blood chemistry: Secondary | ICD-10-CM | POA: Diagnosis not present

## 2024-09-17 DIAGNOSIS — I350 Nonrheumatic aortic (valve) stenosis: Secondary | ICD-10-CM | POA: Diagnosis present

## 2024-09-17 DIAGNOSIS — D631 Anemia in chronic kidney disease: Secondary | ICD-10-CM | POA: Diagnosis present

## 2024-09-17 DIAGNOSIS — E1122 Type 2 diabetes mellitus with diabetic chronic kidney disease: Secondary | ICD-10-CM | POA: Diagnosis present

## 2024-09-17 DIAGNOSIS — Z79899 Other long term (current) drug therapy: Secondary | ICD-10-CM

## 2024-09-17 DIAGNOSIS — Z8249 Family history of ischemic heart disease and other diseases of the circulatory system: Secondary | ICD-10-CM

## 2024-09-17 DIAGNOSIS — I2510 Atherosclerotic heart disease of native coronary artery without angina pectoris: Secondary | ICD-10-CM | POA: Diagnosis present

## 2024-09-17 DIAGNOSIS — J45909 Unspecified asthma, uncomplicated: Secondary | ICD-10-CM | POA: Diagnosis present

## 2024-09-17 DIAGNOSIS — Z794 Long term (current) use of insulin: Secondary | ICD-10-CM | POA: Diagnosis not present

## 2024-09-17 DIAGNOSIS — E1165 Type 2 diabetes mellitus with hyperglycemia: Secondary | ICD-10-CM | POA: Diagnosis not present

## 2024-09-17 DIAGNOSIS — K625 Hemorrhage of anus and rectum: Secondary | ICD-10-CM | POA: Diagnosis not present

## 2024-09-17 DIAGNOSIS — K219 Gastro-esophageal reflux disease without esophagitis: Secondary | ICD-10-CM | POA: Diagnosis present

## 2024-09-17 DIAGNOSIS — K648 Other hemorrhoids: Secondary | ICD-10-CM | POA: Diagnosis present

## 2024-09-17 DIAGNOSIS — Z825 Family history of asthma and other chronic lower respiratory diseases: Secondary | ICD-10-CM

## 2024-09-17 DIAGNOSIS — Z7982 Long term (current) use of aspirin: Secondary | ICD-10-CM

## 2024-09-17 DIAGNOSIS — Q2112 Patent foramen ovale: Secondary | ICD-10-CM

## 2024-09-17 DIAGNOSIS — I132 Hypertensive heart and chronic kidney disease with heart failure and with stage 5 chronic kidney disease, or end stage renal disease: Secondary | ICD-10-CM | POA: Diagnosis present

## 2024-09-17 DIAGNOSIS — D539 Nutritional anemia, unspecified: Secondary | ICD-10-CM | POA: Diagnosis present

## 2024-09-17 DIAGNOSIS — Z91041 Radiographic dye allergy status: Secondary | ICD-10-CM

## 2024-09-17 DIAGNOSIS — Z950 Presence of cardiac pacemaker: Secondary | ICD-10-CM

## 2024-09-17 DIAGNOSIS — Z90710 Acquired absence of both cervix and uterus: Secondary | ICD-10-CM

## 2024-09-17 DIAGNOSIS — R29705 NIHSS score 5: Secondary | ICD-10-CM | POA: Diagnosis present

## 2024-09-17 DIAGNOSIS — E782 Mixed hyperlipidemia: Secondary | ICD-10-CM | POA: Diagnosis present

## 2024-09-17 DIAGNOSIS — I5022 Chronic systolic (congestive) heart failure: Secondary | ICD-10-CM | POA: Diagnosis present

## 2024-09-17 LAB — COMPREHENSIVE METABOLIC PANEL WITH GFR
ALT: <5 U/L (ref 0–44)
AST: <10 U/L — ABNORMAL LOW (ref 15–41)
Albumin: 3.9 g/dL (ref 3.5–5.0)
Alkaline Phosphatase: 179 U/L — ABNORMAL HIGH (ref 38–126)
Anion gap: 28 — ABNORMAL HIGH (ref 5–15)
BUN: 44 mg/dL — ABNORMAL HIGH (ref 6–20)
CO2: 22 mmol/L (ref 22–32)
Calcium: 10.4 mg/dL — ABNORMAL HIGH (ref 8.9–10.3)
Chloride: 90 mmol/L — ABNORMAL LOW (ref 98–111)
Creatinine, Ser: 5.2 mg/dL — ABNORMAL HIGH (ref 0.44–1.00)
GFR, Estimated: 9 mL/min — ABNORMAL LOW
Glucose, Bld: 200 mg/dL — ABNORMAL HIGH (ref 70–99)
Potassium: 5.3 mmol/L — ABNORMAL HIGH (ref 3.5–5.1)
Sodium: 139 mmol/L (ref 135–145)
Total Bilirubin: 0.5 mg/dL (ref 0.0–1.2)
Total Protein: 6.7 g/dL (ref 6.5–8.1)

## 2024-09-17 LAB — PROTIME-INR
INR: 1.3 — ABNORMAL HIGH (ref 0.8–1.2)
Prothrombin Time: 16.6 s — ABNORMAL HIGH (ref 11.4–15.2)

## 2024-09-17 LAB — DIFFERENTIAL
Abs Immature Granulocytes: 0.05 K/uL (ref 0.00–0.07)
Basophils Absolute: 0.1 K/uL (ref 0.0–0.1)
Basophils Relative: 1 %
Eosinophils Absolute: 0.1 K/uL (ref 0.0–0.5)
Eosinophils Relative: 1 %
Immature Granulocytes: 1 %
Lymphocytes Relative: 7 %
Lymphs Abs: 0.5 K/uL — ABNORMAL LOW (ref 0.7–4.0)
Monocytes Absolute: 0.4 K/uL (ref 0.1–1.0)
Monocytes Relative: 5 %
Neutro Abs: 6.2 K/uL (ref 1.7–7.7)
Neutrophils Relative %: 85 %

## 2024-09-17 LAB — ETHANOL: Alcohol, Ethyl (B): <15 mg/dL

## 2024-09-17 LAB — CBC
HCT: 32.5 % — ABNORMAL LOW (ref 36.0–46.0)
Hemoglobin: 9.9 g/dL — ABNORMAL LOW (ref 12.0–15.0)
MCH: 30.8 pg (ref 26.0–34.0)
MCHC: 30.5 g/dL (ref 30.0–36.0)
MCV: 101.2 fL — ABNORMAL HIGH (ref 80.0–100.0)
Platelets: 275 K/uL (ref 150–400)
RBC: 3.21 MIL/uL — ABNORMAL LOW (ref 3.87–5.11)
RDW: 17.8 % — ABNORMAL HIGH (ref 11.5–15.5)
WBC: 7.4 K/uL (ref 4.0–10.5)
nRBC: 0 % (ref 0.0–0.2)

## 2024-09-17 LAB — TROPONIN T, HIGH SENSITIVITY
Troponin T High Sensitivity: 114 ng/L (ref 0–19)
Troponin T High Sensitivity: 106 ng/L (ref 0–19)

## 2024-09-17 LAB — APTT: aPTT: 36 s (ref 24–36)

## 2024-09-17 LAB — CBG MONITORING, ED: Glucose-Capillary: 186 mg/dL — ABNORMAL HIGH (ref 70–99)

## 2024-09-17 LAB — GLUCOSE, CAPILLARY: Glucose-Capillary: 162 mg/dL — ABNORMAL HIGH (ref 70–99)

## 2024-09-17 MED ORDER — ASPIRIN 300 MG RE SUPP
300.0000 mg | Freq: Once | RECTAL | Status: AC
  Administered 2024-09-17: 300 mg via RECTAL
  Filled 2024-09-17: qty 1

## 2024-09-17 MED ORDER — HYDROMORPHONE HCL 1 MG/ML IJ SOLN
0.5000 mg | Freq: Once | INTRAMUSCULAR | Status: AC
  Administered 2024-09-17: 0.5 mg via INTRAVENOUS
  Filled 2024-09-17: qty 0.5

## 2024-09-17 MED ORDER — SODIUM CHLORIDE 0.9 % IV BOLUS
1000.0000 mL | Freq: Once | INTRAVENOUS | Status: AC
  Administered 2024-09-17: 1000 mL via INTRAVENOUS

## 2024-09-17 MED ORDER — ACETAMINOPHEN 325 MG PO TABS
650.0000 mg | ORAL_TABLET | Freq: Four times a day (QID) | ORAL | Status: DC | PRN
  Administered 2024-09-18 – 2024-09-19 (×2): 650 mg via ORAL
  Filled 2024-09-17 (×2): qty 2

## 2024-09-17 MED ORDER — ACETAMINOPHEN 650 MG RE SUPP: 650.0000 mg | Freq: Four times a day (QID) | RECTAL | Status: DC | PRN

## 2024-09-17 MED ORDER — INSULIN ASPART 100 UNIT/ML IJ SOLN
0.0000 [IU] | INTRAMUSCULAR | Status: DC
  Administered 2024-09-17 – 2024-09-21 (×11): 1 [IU] via SUBCUTANEOUS
  Filled 2024-09-17 (×11): qty 1

## 2024-09-17 MED ORDER — OXYCODONE HCL 5 MG PO TABS: 5.0000 mg | ORAL_TABLET | Freq: Once | ORAL | Status: DC

## 2024-09-17 MED ORDER — ONDANSETRON HCL 4 MG/2ML IJ SOLN: 4.0000 mg | Freq: Four times a day (QID) | INTRAMUSCULAR | Status: DC | PRN

## 2024-09-17 MED ORDER — ONDANSETRON HCL 4 MG PO TABS: 4.0000 mg | ORAL_TABLET | Freq: Four times a day (QID) | ORAL | Status: DC | PRN

## 2024-09-17 MED ORDER — STROKE: EARLY STAGES OF RECOVERY BOOK: Freq: Once | Status: AC

## 2024-09-17 NOTE — ED Notes (Addendum)
 EDP at [facility]. PT alert, NAD, calm, interactive, appropriate. Follows commands, answers questions. Able to have conversation. VSS.

## 2024-09-17 NOTE — ED Provider Notes (Signed)
 "  [facility] Provider Note    Event Date/Time   First MD Initiated Contact with Patient 09/17/24 1819     (approximate)   History   Altered Mental Status  History is provided by the patient's husband at bedside [place] HPI  [name] is a 59 y.o. female past medical history significant for hypertension, hyperlipidemia, diabetes, ESRD on HD MWF, calciphylaxis, presents to the emergency department with confusion.  Presents to the emergency department following a sudden onset of confusion.  Was playing games on the computer last night and her last known well would be at 10:30 PM.  All of a sudden started slurring her words and not making sense of what she was saying.  No recent falls or trauma.  Had recently started on gabapentin  so initially thought that this was a side effect of gabapentin .  Has been on oxycodone  for multiple years.  Today had ongoing symptoms of trouble with her speech not making sense.  Denies any significant headache.  Denies fever or chills.  Not on anticoagulation.  Last dialysis session was on Friday.  Was supposed to also do a session on Saturday to take off more fluid however missed this secondary to her not feeling well and having an episode of nausea and vomiting.  Patient appears to understand what you are saying but is having significant expressive aphasia     Physical Exam   Triage Vital Signs: ED Triage Vitals  Encounter Vitals Group     BP 09/17/24 1748 101/72     Girls Systolic BP Percentile --      Girls Diastolic BP Percentile --      Boys Systolic BP Percentile --      Boys Diastolic BP Percentile --      Pulse Rate 09/17/24 1748 87     Resp 09/17/24 1748 18     Temp 09/17/24 1748 97.8 F (36.6 C)     Temp Source 09/17/24 1748 Oral     SpO2 09/17/24 1748 94 %     Weight 09/17/24 1754 172 lb (78 kg)     Height 09/17/24 1754 5' 7 (1.702 m)     Head Circumference --      Peak Flow --      Pain Score 09/17/24 1749 0      Pain Loc --      Pain Education --      Exclude from Growth Chart --     Most recent vital signs: Vitals:   09/17/24 1845 09/17/24 1850  BP:    Pulse: 88 85  Resp: 19 14  Temp:    SpO2: 100% 100%    Physical Exam Constitutional:      Appearance: She is well-developed.  HENT:     Head: Atraumatic.  Eyes:     Conjunctiva/sclera: Conjunctivae normal.  Cardiovascular:     Rate and Rhythm: Regular rhythm.  Pulmonary:     Effort: No respiratory distress.  Abdominal:     General: There is no distension.     Tenderness: There is no abdominal tenderness.  Musculoskeletal:        General: Normal range of motion.     Cervical back: Normal range of motion.     Comments: Bilateral lower extremities are wrapped in compression dressing.  Skin:    General: Skin is warm.  Neurological:     Mental Status: She is alert. Mental status is at baseline.     GCS: GCS eye  subscore is 4. GCS verbal subscore is 5. GCS motor subscore is 6.     Cranial Nerves: Cranial nerves 2-12 are intact.     Sensory: Sensation is intact.     Motor: Motor function is intact.     Coordination: Coordination is intact.     Comments: Significant expressive aphasia.  Wheelchair-bound at baseline.  Unable to lift lower extremities to gravity at baseline.     IMPRESSION / MDM / ASSESSMENT AND PLAN / ED COURSE  I reviewed the triage vital signs and the nursing notes.  Patient is outside of the window for TNK.  Van negative and low suspicion for LVO.  Last known well was at 10:30 PM last night.   Differential diagnosis including metabolic encephalopathy, acute CVA, intracranial hemorrhage, medication side effect, infectious process, pneumonia  Point-of-care glucose 186  NIH score of 5 -patient has a contrast allergy. VAN neg -  Not a thrombectomy candidate.   EKG  I, Clotilda Punter, the attending physician, personally viewed and interpreted this ECG.  EKG with paced rhythm.  Negative Sgarbossa's criteria.   ST depression to the lead III.  ST elevation to aVL.  Does have some change when compared to prior.  STEMI criteria.  No findings of acute ischemia.  No tachycardic or bradycardic dysrhythmias while on cardiac telemetry.  RADIOLOGY  CT scan of the head read as acute infarct to the posterior left temporal and parietal lobes.  Chest x-ray no acute findings  LABS (all labs ordered are listed, but only abnormal results are displayed) Labs interpreted as -    Labs Reviewed  PROTIME-INR - Abnormal; Notable for the following components:      Result Value   Prothrombin Time 16.6 (*)    INR 1.3 (*)    All other components within normal limits  CBC - Abnormal; Notable for the following components:   RBC 3.21 (*)    Hemoglobin 9.9 (*)    HCT 32.5 (*)    MCV 101.2 (*)    RDW 17.8 (*)    All other components within normal limits  DIFFERENTIAL - Abnormal; Notable for the following components:   Lymphs Abs 0.5 (*)    All other components within normal limits  COMPREHENSIVE METABOLIC PANEL WITH GFR - Abnormal; Notable for the following components:   Potassium 5.3 (*)    Chloride 90 (*)    Glucose, Bld 200 (*)    BUN 44 (*)    Creatinine, Ser 5.20 (*)    Calcium  10.4 (*)    AST <10 (*)    Alkaline Phosphatase 179 (*)    GFR, Estimated 9 (*)    Anion gap 28 (*)    All other components within normal limits  CBG MONITORING, ED - Abnormal; Notable for the following components:   Glucose-Capillary 186 (*)    All other components within normal limits  TROPONIN T, HIGH SENSITIVITY - Abnormal; Notable for the following components:   Troponin T High Sensitivity 114 (*)    All other components within normal limits  APTT  ETHANOL  I-STAT CREATININE, ED     MDM  Patient's last known well is 10:30 PM last night.  CT scan of the head with findings concerning for an acute infarct.  Discussed the patient's case with neurology Dr. Matthews, not a thrombectomy candidate given CT scan findings of  infarct on her head.  Also has a contrast allergy.  Recommended rectal aspirin  if she failed her swallow study.  Failed her swallow study so given 300 mg of rectal aspirin .  Permissive hypertension with a blood pressure goal of at least 130 systolic.  Currently is 99 systolic.  Given of IV fluid bolus.  Troponin is elevated at 114.  Creatinine appears to be at her baseline.  Anemia but appears stable with no signs or symptoms of a GI bleed.  Given 1 bolus of IV fluids to improve her perfusion given her soft blood pressures of 99 systolic.  Consulted hospitalist for admission for acute CVA with expressive aphasia.     PROCEDURES:  Critical Care performed: yes  .Critical Care  Performed by: Suzanne Kirsch, MD Authorized by: Suzanne Kirsch, MD   Critical care provider statement:    Critical care time (minutes):  30   Critical care time was exclusive of:  Separately billable procedures and treating other patients   Critical care was necessary to treat or prevent imminent or life-threatening deterioration of the following conditions:  CNS failure or compromise   Critical care was time spent personally by me on the following activities:  Development of treatment plan with patient or surrogate, discussions with consultants, evaluation of patient's response to treatment, examination of patient, ordering and review of laboratory studies, ordering and review of radiographic studies, ordering and performing treatments and interventions, pulse oximetry, re-evaluation of patient's condition and review of old charts   Care discussed with: admitting provider     Patient's presentation is most consistent with acute presentation with potential threat to life or bodily function.   MEDICATIONS ORDERED IN ED: Medications  sodium chloride  0.9 % bolus 1,000 mL (has no administration in time range)  aspirin  suppository 300 mg (has no administration in time range)    FINAL CLINICAL IMPRESSION(S) / ED  DIAGNOSES   Final diagnoses:  Expressive aphasia  Cerebrovascular accident (CVA), unspecified mechanism (HCC)     Rx / DC Orders   ED Discharge Orders     None        Note:  This document was prepared using Dragon voice recognition software and may include unintentional dictation errors.   Suzanne Kirsch, MD 09/17/24 1902  "

## 2024-09-17 NOTE — H&P (Addendum)
 " History and Physical    Patient: [name] FMW:[id number] DOB: 1966-06-25 DOA: 09/17/2024 DOS: the patient was seen and examined on 09/17/2024 PCP: Franchot Isaiah LABOR, MD  Patient coming from: Home  Chief Complaint:  Chief Complaint  Patient presents with   Altered Mental Status   HPI: [name] is a 59 y.o. 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  Patient was playing games on the computer last night when she suddenly started to slur her words with her speech not making sense.  Last known well was around 10:30 PM yesterday (1/17).  Patient thought that her symptoms were due to gabapentin  she recently started to take.  Patient's symptoms persisted today, so it was decided for her to go to the ED for further evaluation. Dialysis was on Friday (1/16), she was scheduled for another session on Saturday to take off more fluid, but due to not feeling well, she was unable to go for the session.  She denied fall, headache, fever, chills.  Patient is wheelchair-bound at baseline  ED course In the emergency department, she was hemodynamically stable.  Workup in the ED showed macrocytic anemia.  BMP was significant for potassium of 5.3, chloride 90, blood glucose 200, BUN 44, creatinine 5.20, calcium  10.4, ALP 179, anion gap 28 (within baseline range). CT head without contrast showed acute infarction in the posterior left temporal and parietal lobes Aspirin  300 mg was given rectally (due to patient failing bedside swallow eval).  IV hydration provided. Neurologist (Dr. Matthews) was consulted and will see patient in the morning per EDP [facility] was asked to admit patient    Review of Systems: As mentioned in the history of present illness. All other systems reviewed and are negative. Past Medical History:  Diagnosis Date   Allergy    Anemia    vitamin d3  deficiency   Anxiety    Asthma    Calciphylaxis    Chronic kidney disease    End Stage Renal Disease   Diabetes mellitus without complication (HCC)    GERD (gastroesophageal reflux disease)    nothing over last few years   Heart murmur    High serum parathyroid hormone (PTH)    checked through Dialysis   History of kidney stones 2000   Hypertension    Neuromuscular disorder (HCC)    neuropathy in feet   Osteoporosis    PONV (postoperative nausea and vomiting)    severe nausea requiring many doses of post op antiemetics   Past Surgical History:  Procedure Laterality Date   ABDOMINAL HYSTERECTOMY  2007   APPLICATION OF WOUND VAC Left 11/15/2021   Procedure: APPLICATION OF WOUND VAC;  Surgeon: Marchia Drivers, MD;  Location: [facility];  Service: Orthopedics;  Laterality: Left;  Prevena 13cm    APPLICATION OF WOUND VAC Right 12/14/2021   Procedure: APPLICATION OF WOUND VAC;  Surgeon: Tobie Priest, MD;  Location: [facility];  Service: Orthopedics;  Laterality: Right;  [id number]   AV FISTULA INSERTION W/ RF MAGNETIC GUIDANCE N/A 12/08/2017   Procedure: AV FISTULA INSERTION W/RF MAGNETIC GUIDANCE;  Surgeon: Jama Cordella MATSU, MD;  Location: [facility];  Service: Cardiovascular;  Laterality: N/A;   DIALYSIS/PERMA CATHETER INSERTION Right 10/03/2021   Procedure: DIALYSIS/PERMA CATHETER INSERTION;  Surgeon: Jama Cordella MATSU, MD;  Location: [facility];  Service: Cardiovascular;  Laterality: Right;   HIP ARTHROPLASTY  Left 09/29/2021   Procedure: ARTHROPLASTY BIPOLAR HIP (HEMIARTHROPLASTY);  Surgeon: Leora Lynwood SAUNDERS, MD;  Location: [facility];  Service: Orthopedics;  Laterality: Left;   HIP ARTHROPLASTY Right 12/14/2021   Procedure: ARTHROPLASTY BIPOLAR HIP (HEMIARTHROPLASTY);  Surgeon: Tobie Priest, MD;  Location: [facility];  Service: Orthopedics;  Laterality: Right;   INCISION AND DRAINAGE HIP Left 11/15/2021   Procedure: IRRIGATION AND DEBRIDEMENT LEFT HIP WOUND;  Surgeon:  Marchia Drivers, MD;  Location: [facility];  Service: Orthopedics;  Laterality: Left;   PACEMAKER LEADLESS INSERTION N/A 03/09/2024   Procedure: PACEMAKER LEADLESS INSERTION;  Surgeon: Nancey Eulas BRAVO, MD;  Location: [facility];  Service: Cardiovascular;  Laterality: N/A;   QUADRICEPS TENDON REPAIR Right 10/16/2021   Procedure: REPAIR QUADRICEP TENDON;  Surgeon: Doll Skates, MD;  Location: [facility];  Service: Orthopedics;  Laterality: Right;   ROTATOR CUFF REPAIR Right 2004   SHOULDER CLOSED REDUCTION Right 2004   UPPER EXTREMITY VENOGRAPHY Left 02/15/2018   Procedure: UPPER EXTREMITY VENOGRAPHY;  Surgeon: Jama Cordella MATSU, MD;  Location: [facility];  Service: Cardiovascular;  Laterality: Left;   Social History:  reports that she has never smoked. She has never used smokeless tobacco. She reports that she does not drink alcohol and does not use drugs.  Allergies[1]  Family History  Problem Relation Age of Onset   Emphysema Mother    COPD Mother    Cancer Father    Cancer Paternal Aunt    Diabetes Sister    Hypertension Sister    Eczema Sister    Diabetes Brother    Hypertension Brother    Cardiomyopathy Brother    Alcohol abuse Brother     Prior to Admission medications  Medication Sig Start Date End Date Taking? Authorizing Provider  albuterol  (VENTOLIN  HFA) 108 (90 Base) MCG/ACT inhaler Inhale 2 puffs into the lungs every 6 (six) hours as needed for wheezing or shortness of breath. 02/20/24   [name], MD  B Complex-C-Folic Acid  (RENA-VITE PO) Take 1 tablet by mouth daily. 05/09/24   [provider]  Continuous Glucose Sensor (DEXCOM G7 SENSOR) MISC Use to check blood sugar as needed for insulin  dependent type 2 diabetes 07/25/24   Franchot Isaiah LABOR, MD  Continuous Glucose Sensor (DEXCOM G7 SENSOR) MISC Use to monitor blood sugar 07/26/24   Franchot Isaiah LABOR, MD  Dupilumab 300 MG/2ML SOAJ Inject 300 mg into the skin. 05/19/24   [provider]  gabapentin  (NEURONTIN ) 100 MG capsule Take 1 capsule (100 mg total) by mouth at bedtime. 09/02/24   Wouk, Devaughn Sayres, MD  ipratropium-albuterol  (DUONEB) 0.5-2.5 (3) MG/3ML SOLN Take 3 mLs by nebulization 4 (four) times daily. Patient taking differently: Take 3 mLs by nebulization every 6 (six) hours as needed. Only as needed    [provider]  naloxone  (NARCAN ) nasal spray 4 mg/0.1 mL Place 1 spray into the nose as needed for up to 365 doses (for opioid-induced respiratory depresssion). In case of emergency (overdose), spray once into each nostril. If no response within 3 minutes, repeat application and call 911. 07/10/24 07/10/25  Patel, Seema K, NP  NOVOLOG  FLEXPEN 100 UNIT/ML FlexPen Inject 14 Units into the skin 3 (three) times daily with meals.    [provider]  oxyCODONE  (OXY IR/ROXICODONE ) 5 MG immediate release tablet Take 1 tablet (5 mg total) by mouth every 12 (twelve) hours as needed for severe pain (pain score 7-10). Must last 30 days. 09/10/24 10/10/24  Patel, Seema K,  NP  oxyCODONE  (OXY IR/ROXICODONE ) 5 MG immediate release tablet Take 1 tablet (5 mg total) by mouth every 12 (twelve) hours as needed for severe pain (pain score 7-10). Must last 30 days. 10/10/24 11/09/24  Patel, Seema K, NP  oxyCODONE  (OXY IR/ROXICODONE ) 5 MG immediate release tablet Take 1 tablet (5 mg total) by mouth every 12 (twelve) hours as needed for severe pain (pain score 7-10). Must last 30 days. 11/09/24 12/09/24  Patel, Seema K, NP  pentoxifylline  (TRENTAL ) 400 MG CR tablet Take 1 tablet (400 mg total) by mouth daily. 05/09/24   Claudene Lehmann, MD  sevelamer  carbonate (RENVELA ) 800 MG tablet Take 2,400 mg by mouth 3 (three) times daily with meals. 12/18/23   [provider]  SODIUM THIOSULFATE  IV Inject 12.5 mg into the vein 3 (three) times a week.    [provider]    Physical Exam: Vitals:   09/17/24 1835 09/17/24 1840 09/17/24 1845 09/17/24 1850  BP:       Pulse: 87 86 88 85  Resp: 16 (!) 23 19 14   Temp:      TempSrc:      SpO2: 100% 100% 100% 100%  Weight:      Height:       General: Awake and alert and oriented x3. Not in any acute distress.  HEENT: NCAT.  PERRLA. EOMI. Sclerae anicteric.  Moist mucosal membranes. Neck: Neck supple without lymphadenopathy. No carotid bruits. No masses palpated.  Cardiovascular: Regular rate with normal S1-S2 sounds. No murmurs, rubs or gallops auscultated. No JVD.  Respiratory: Clear breath sounds.  No accessory muscle use. Abdomen: Soft, nontender, nondistended. Active bowel sounds. No masses or hepatosplenomegaly  Skin: No rashes, lesions, or ulcerations.  Dry, warm to touch. Musculoskeletal: Noted bilateral Unna boots with Coban dressing on both legs.  Failed AV fistula in LUE.  Port-A-Cath in chest noted.  2+ dorsalis pedis and radial pulses. Good ROM.  No contractures  Psychiatric:  Mood appropriate to current condition. Neurologic: Expressive aphasia with intermittent confusion.    Assessment and Plan: Acute ischemic stroke Patient will be admitted to telemetry unit  Echocardiogram in the morning MRI of brain without contrast in the morning Continue fall precautions and neuro checks Lipid panel and hemoglobin A1c will be checked Continue PT/SLP/OT eval and treat Patient failed bedside swallow eval. she is currently n.p.o. Neurology (Dr. Matthews) was consulted and will see patient in the morning per EDP  Hyperkalemia K+ 5.3, possibly due to ESRD, this will be corrected during dialysis.  ESRD on HD (MWF) Nephrologist will be consulted for maintenance dialysis Renvela  and tenapanor will be held at this time due to failure of swallow eval  Calciphylaxis/ Hypercalcemia possibly due to ESRD Calcium  10.4, minimally elevated, can be corrected during dialysis Patient with Unna boot with Coban dressing on both legs Wound care consulted Patient on gabapentin .  This will be held at this time due to  failure of bedside swallow eval.  Speech therapy eval in the morning  Prolonged QT interval QTc 521 ms Patient is ventricular paced Avoid QT prolonging drugs K+ is 5.3, 1 NS given in the ED.  This will be corrected urinalysis Magnesium  level will be checked Repeat EKG in the morning  Elevated troponin probably secondary to type II demand ischemia Troponin 114 > 106, troponin has flattened.  She denies chest pain.  Type 2 diabetes mellitus with hyperglycemia Hemoglobin A1c on 07/25/2024 was 6.5 Continue ISS and hypoglycemic protocol  Essential hypertension (controlled) No  antihypertensive medication noted on med rec  Mixed hyperlipidemia No antihyperlipidemic medication noted on patient's med rec.  We shall await updated med rec   Advance Care Planning: Full code  Consults: Neurology (by EDP), nephrology  Family Communication: Husband at bedside  Severity of Illness: The appropriate patient status for this patient is OBSERVATION. Observation status is judged to be reasonable and necessary in order to provide the required intensity of service to ensure the patient's safety. The patient's presenting symptoms, physical exam findings, and initial radiographic and laboratory data in the context of their medical condition is felt to place them at decreased risk for further clinical deterioration. Furthermore, it is anticipated that the patient will be medically stable for discharge from the hospital within 2 midnights of admission.   Author: Ondre Salvetti, DO 09/17/2024 7:19 PM  For on call review [email or web address].      [1]  Allergies Allergen Reactions   Enalapril Hives and Other (See Comments)    Angioedema face.   Ivp Dye [Iodinated Contrast Media] Hives   Lisinopril Shortness Of Breath   Prednisone  Shortness Of Breath   "

## 2024-09-17 NOTE — ED Triage Notes (Signed)
 Pt is ESRD pt with dialysis MWF, last treatment Friday. Pt just started on Gabapentin  100mg  at bedtime. Pt also takes oxycodone  for pain. Pt has calciphylaxis. Pt family reports pt exhibited confusion at 2330. Pt with expressive aphasia. No focal weakness. No sensation deficits. No visual deficits. Face is symmetrical.

## 2024-09-18 ENCOUNTER — Observation Stay

## 2024-09-18 ENCOUNTER — Observation Stay: Admit: 2024-09-18 | Discharge: 2024-09-18 | Disposition: A | Attending: Internal Medicine

## 2024-09-18 ENCOUNTER — Other Ambulatory Visit: Payer: Self-pay

## 2024-09-18 ENCOUNTER — Encounter: Admitting: Physician Assistant

## 2024-09-18 DIAGNOSIS — D631 Anemia in chronic kidney disease: Secondary | ICD-10-CM | POA: Diagnosis present

## 2024-09-18 DIAGNOSIS — I5022 Chronic systolic (congestive) heart failure: Secondary | ICD-10-CM | POA: Diagnosis present

## 2024-09-18 DIAGNOSIS — J45909 Unspecified asthma, uncomplicated: Secondary | ICD-10-CM | POA: Diagnosis present

## 2024-09-18 DIAGNOSIS — I2489 Other forms of acute ischemic heart disease: Secondary | ICD-10-CM | POA: Diagnosis present

## 2024-09-18 DIAGNOSIS — I63412 Cerebral infarction due to embolism of left middle cerebral artery: Secondary | ICD-10-CM | POA: Diagnosis present

## 2024-09-18 DIAGNOSIS — R29707 NIHSS score 7: Secondary | ICD-10-CM | POA: Diagnosis not present

## 2024-09-18 DIAGNOSIS — I442 Atrioventricular block, complete: Secondary | ICD-10-CM | POA: Diagnosis present

## 2024-09-18 DIAGNOSIS — I132 Hypertensive heart and chronic kidney disease with heart failure and with stage 5 chronic kidney disease, or end stage renal disease: Secondary | ICD-10-CM | POA: Diagnosis present

## 2024-09-18 DIAGNOSIS — K625 Hemorrhage of anus and rectum: Secondary | ICD-10-CM | POA: Diagnosis not present

## 2024-09-18 DIAGNOSIS — N2581 Secondary hyperparathyroidism of renal origin: Secondary | ICD-10-CM | POA: Diagnosis present

## 2024-09-18 DIAGNOSIS — I272 Pulmonary hypertension, unspecified: Secondary | ICD-10-CM | POA: Diagnosis present

## 2024-09-18 DIAGNOSIS — Z992 Dependence on renal dialysis: Secondary | ICD-10-CM | POA: Diagnosis not present

## 2024-09-18 DIAGNOSIS — E1122 Type 2 diabetes mellitus with diabetic chronic kidney disease: Secondary | ICD-10-CM

## 2024-09-18 DIAGNOSIS — N186 End stage renal disease: Secondary | ICD-10-CM | POA: Diagnosis present

## 2024-09-18 DIAGNOSIS — I639 Cerebral infarction, unspecified: Secondary | ICD-10-CM | POA: Diagnosis not present

## 2024-09-18 DIAGNOSIS — I120 Hypertensive chronic kidney disease with stage 5 chronic kidney disease or end stage renal disease: Secondary | ICD-10-CM | POA: Diagnosis not present

## 2024-09-18 DIAGNOSIS — Z794 Long term (current) use of insulin: Secondary | ICD-10-CM | POA: Diagnosis not present

## 2024-09-18 DIAGNOSIS — Z515 Encounter for palliative care: Secondary | ICD-10-CM | POA: Diagnosis not present

## 2024-09-18 DIAGNOSIS — E1142 Type 2 diabetes mellitus with diabetic polyneuropathy: Secondary | ICD-10-CM | POA: Diagnosis present

## 2024-09-18 DIAGNOSIS — E782 Mixed hyperlipidemia: Secondary | ICD-10-CM | POA: Diagnosis present

## 2024-09-18 DIAGNOSIS — Q2112 Patent foramen ovale: Secondary | ICD-10-CM | POA: Diagnosis not present

## 2024-09-18 DIAGNOSIS — E872 Acidosis, unspecified: Secondary | ICD-10-CM | POA: Diagnosis present

## 2024-09-18 DIAGNOSIS — R410 Disorientation, unspecified: Secondary | ICD-10-CM | POA: Diagnosis present

## 2024-09-18 DIAGNOSIS — I6340 Cerebral infarction due to embolism of unspecified cerebral artery: Secondary | ICD-10-CM | POA: Diagnosis not present

## 2024-09-18 DIAGNOSIS — I350 Nonrheumatic aortic (valve) stenosis: Secondary | ICD-10-CM | POA: Diagnosis present

## 2024-09-18 DIAGNOSIS — E875 Hyperkalemia: Secondary | ICD-10-CM | POA: Diagnosis present

## 2024-09-18 DIAGNOSIS — R4701 Aphasia: Secondary | ICD-10-CM | POA: Diagnosis present

## 2024-09-18 LAB — LIPID PANEL
Cholesterol: 97 mg/dL (ref 0–200)
HDL: 44 mg/dL
LDL Cholesterol: 32 mg/dL (ref 0–99)
Total CHOL/HDL Ratio: 2.2 ratio
Triglycerides: 102 mg/dL
VLDL: 20 mg/dL (ref 0–40)

## 2024-09-18 LAB — COMPREHENSIVE METABOLIC PANEL WITH GFR
ALT: <5 U/L (ref 0–44)
AST: <10 U/L — ABNORMAL LOW (ref 15–41)
Albumin: 3.6 g/dL (ref 3.5–5.0)
Alkaline Phosphatase: 158 U/L — ABNORMAL HIGH (ref 38–126)
Anion gap: 28 — ABNORMAL HIGH (ref 5–15)
BUN: 47 mg/dL — ABNORMAL HIGH (ref 6–20)
CO2: 19 mmol/L — ABNORMAL LOW (ref 22–32)
Calcium: 10.2 mg/dL (ref 8.9–10.3)
Chloride: 91 mmol/L — ABNORMAL LOW (ref 98–111)
Creatinine, Ser: 5.58 mg/dL — ABNORMAL HIGH (ref 0.44–1.00)
GFR, Estimated: 8 mL/min — ABNORMAL LOW
Glucose, Bld: 153 mg/dL — ABNORMAL HIGH (ref 70–99)
Potassium: 5.3 mmol/L — ABNORMAL HIGH (ref 3.5–5.1)
Sodium: 138 mmol/L (ref 135–145)
Total Bilirubin: 0.6 mg/dL (ref 0.0–1.2)
Total Protein: 6.1 g/dL — ABNORMAL LOW (ref 6.5–8.1)

## 2024-09-18 LAB — GLUCOSE, CAPILLARY
Glucose-Capillary: 110 mg/dL — ABNORMAL HIGH (ref 70–99)
Glucose-Capillary: 123 mg/dL — ABNORMAL HIGH (ref 70–99)
Glucose-Capillary: 152 mg/dL — ABNORMAL HIGH (ref 70–99)
Glucose-Capillary: 153 mg/dL — ABNORMAL HIGH (ref 70–99)
Glucose-Capillary: 164 mg/dL — ABNORMAL HIGH (ref 70–99)
Glucose-Capillary: 193 mg/dL — ABNORMAL HIGH (ref 70–99)

## 2024-09-18 LAB — PHOSPHORUS: Phosphorus: 4.5 mg/dL (ref 2.5–4.6)

## 2024-09-18 LAB — HEMOGLOBIN A1C
Hgb A1c MFr Bld: 6.7 % — ABNORMAL HIGH (ref 4.8–5.6)
Mean Plasma Glucose: 145.59 mg/dL

## 2024-09-18 LAB — IRON AND TIBC
Iron: 41 ug/dL (ref 28–170)
Saturation Ratios: 19 % (ref 10.4–31.8)
TIBC: 217 ug/dL — ABNORMAL LOW (ref 250–450)
UIBC: 176 ug/dL

## 2024-09-18 LAB — CBC
HCT: 31.3 % — ABNORMAL LOW (ref 36.0–46.0)
Hemoglobin: 9.3 g/dL — ABNORMAL LOW (ref 12.0–15.0)
MCH: 30.1 pg (ref 26.0–34.0)
MCHC: 29.7 g/dL — ABNORMAL LOW (ref 30.0–36.0)
MCV: 101.3 fL — ABNORMAL HIGH (ref 80.0–100.0)
Platelets: 252 K/uL (ref 150–400)
RBC: 3.09 MIL/uL — ABNORMAL LOW (ref 3.87–5.11)
RDW: 17.4 % — ABNORMAL HIGH (ref 11.5–15.5)
WBC: 7 K/uL (ref 4.0–10.5)
nRBC: 0 % (ref 0.0–0.2)

## 2024-09-18 LAB — MAGNESIUM: Magnesium: 2.2 mg/dL (ref 1.7–2.4)

## 2024-09-18 MED ORDER — HEPARIN SODIUM (PORCINE) 5000 UNIT/ML IJ SOLN
5000.0000 [IU] | Freq: Three times a day (TID) | INTRAMUSCULAR | Status: DC
  Administered 2024-09-18 – 2024-09-20 (×4): 5000 [IU] via SUBCUTANEOUS
  Filled 2024-09-18 (×8): qty 1

## 2024-09-18 MED ORDER — ALTEPLASE 2 MG IJ SOLR
2.0000 mg | Freq: Once | INTRAMUSCULAR | Status: AC | PRN
  Administered 2024-09-18: 2 mg

## 2024-09-18 MED ORDER — HYDROMORPHONE HCL 1 MG/ML IJ SOLN
0.5000 mg | INTRAMUSCULAR | Status: DC | PRN
  Administered 2024-09-18 – 2024-09-20 (×5): 0.5 mg via INTRAVENOUS
  Filled 2024-09-18 (×7): qty 0.5

## 2024-09-18 MED ORDER — HEPARIN SODIUM (PORCINE) 1000 UNIT/ML DIALYSIS: 1000.0000 [IU] | INTRAMUSCULAR | Status: DC | PRN

## 2024-09-18 MED ORDER — TRAMADOL HCL 50 MG PO TABS
50.0000 mg | ORAL_TABLET | Freq: Once | ORAL | Status: AC
  Administered 2024-09-18: 50 mg via ORAL
  Filled 2024-09-18: qty 1

## 2024-09-18 MED ORDER — ALTEPLASE 2 MG IJ SOLR
INTRAMUSCULAR | Status: AC
  Filled 2024-09-18: qty 4

## 2024-09-18 MED ORDER — ATORVASTATIN CALCIUM 20 MG PO TABS
20.0000 mg | ORAL_TABLET | Freq: Every evening | ORAL | Status: DC
  Administered 2024-09-18: 20 mg via ORAL
  Filled 2024-09-18: qty 1

## 2024-09-18 MED ORDER — STERILE WATER FOR INJECTION IJ SOLN
INTRAMUSCULAR | Status: AC
  Filled 2024-09-18: qty 10

## 2024-09-18 MED ORDER — OXYCODONE HCL 5 MG PO TABS: 5.0000 mg | ORAL_TABLET | ORAL | Status: DC | PRN

## 2024-09-18 MED ORDER — SODIUM THIOSULFATE 250 MG/ML IV SOLN
25.0000 g | INTRAVENOUS | Status: DC
  Administered 2024-09-18 – 2024-09-20 (×2): 25 g via INTRAVENOUS
  Filled 2024-09-18 (×2): qty 100

## 2024-09-18 MED ORDER — CHLORHEXIDINE GLUCONATE CLOTH 2 % EX PADS
6.0000 | MEDICATED_PAD | Freq: Every day | CUTANEOUS | Status: DC
  Administered 2024-09-18 – 2024-09-21 (×4): 6 via TOPICAL

## 2024-09-18 MED ORDER — GABAPENTIN 100 MG PO CAPS
100.0000 mg | ORAL_CAPSULE | Freq: Every day | ORAL | Status: DC
  Administered 2024-09-18 – 2024-09-20 (×3): 100 mg via ORAL
  Filled 2024-09-18 (×4): qty 1

## 2024-09-18 MED ORDER — ASPIRIN 81 MG PO TBEC
81.0000 mg | DELAYED_RELEASE_TABLET | Freq: Every day | ORAL | Status: DC
  Administered 2024-09-18 – 2024-09-21 (×3): 81 mg via ORAL
  Filled 2024-09-18 (×3): qty 1

## 2024-09-18 NOTE — Progress Notes (Signed)
 [facility] Progress Note  Patient: [name]    FMW:[id number]  DOA: 09/17/2024     Date of Service: the patient was seen and examined on 09/18/2024  Chief Complaint  Patient presents with   Altered Mental Status   Brief hospital course: [name] is a 59 y.o. 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  Patient was playing games on the computer last night when she suddenly started to slur her words with her speech not making sense.  Last known well was around 10:30 PM yesterday (1/17).  Patient thought that her symptoms were due to gabapentin  she recently started to take.  Patient's symptoms persisted today, so it was decided for her to go to the ED for further evaluation. Dialysis was on Friday (1/16), she was scheduled for another session on Saturday to take off more fluid, but due to not feeling well, she was unable to go for the session.  She denied fall, headache, fever, chills.  Patient is wheelchair-bound at baseline   ED course In the emergency department, she was hemodynamically stable.  Workup in the ED showed macrocytic anemia.  BMP was significant for potassium of 5.3, chloride 90, blood glucose 200, BUN 44, creatinine 5.20, calcium  10.4, ALP 179, anion gap 28 (within baseline range). CT head without contrast showed acute infarction in the posterior left temporal and parietal lobes Aspirin  300 mg was given rectally (due to patient failing bedside swallow eval).  IV hydration provided. Neurologist (Dr. Matthews) was consulted and will see patient in the morning per EDP [facility] was asked to admit patient    Assessment and Plan:  # Acute ischemic stroke MRI brain: 1. Large area of acute ischemia affecting the posterior left MCA territory. 2. Multiple punctate foci of acute ischemia within the centrum semiovale bilaterally. 3. Old left  frontal and posterior parietal infarcts. Continue fall precautions and neuro checks Lipid panel LDL 32, very below 70.  Remains at high risk for intracerebral hemorrhage due to low LDL but as per neurology studies have shown beneficial effects of statin and acute stroke.  So started on Lipitor 20 mg p.o. daily. HbA1c at 6.7 Continue PT/SLP/OT eval and treat MRA head and neck could not be done because patient's heart rate dropped to 30s to 40s after placing pacemaker on the MRI mode. Patient is allergic to IV contrast so we will do carotid Doppler.,  If needed then we will premedicate for CTA as per neurology Follow TTE Follow carotid Doppler Seen by neurology, recommended to continue aspirin  81 mg p.o. daily and statin, no need of DAPT at this time.    # Hyperkalemia K+ 5.3, possibly due to ESRD, this will be corrected during dialysis.   ESRD on HD (MWF) Nephrologist consulted for HD    # Calciphylaxis/ Hypercalcemia possibly due to ESRD Calcium  10.4, minimally elevated, can be corrected during dialysis Patient with Unna boot with Coban dressing on both legs Wound care consulted Sodium thiosulfate  IV every MWF with hemodialysis    Prolonged QT interval QTc 521 ms Patient is ventricular paced Avoid QT prolonging drugs K+ is 5.3, 1 NS given in the ED.  This will be corrected urinalysis Magnesium  level will be checked F/u Repeat EKG   Elevated troponin probably secondary to type II demand ischemia Troponin 114 > 106, troponin has flattened.  She denies chest pain.   Type 2  diabetes mellitus with hyperglycemia Hemoglobin A1c on 07/25/2024 was 6.5 Continue ISS and hypoglycemic protocol   Essential hypertension (controlled) No antihypertensive medication noted on med rec   Mixed hyperlipidemia No antihyperlipidemic medication noted on patient's med rec.   Lipid profile: TC 97, LDL 32, TG 102 Started low-dose statin Lipitor 20 mg p.o. daily   Body mass index is 28.94 kg/m.   Interventions:  Diet: Carb modified diet DVT Prophylaxis: Subcutaneous Heparin     Advance goals of care discussion: Full code  Family Communication: family was present at bedside, at the time of interview.  The pt provided permission to discuss medical plan with the family. Opportunity was given to ask question and all questions were answered satisfactorily.  1/19 d/w patient's husband at bedside.  Disposition:  Pt is from Home, admitted with Acute sroke, still w/up pending, which precludes a safe discharge. Discharge to SNF, when stable, may need 1-2 more days.  Subjective: No significant events overnight.  Patient is forgetful, AO x 1, confused.  Denied any other active issues.  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, Unna boots intact  Neurologic: without any new focal findings Gait not checked due to patient safety concerns  Vitals:   09/18/24 0426 09/18/24 0800 09/18/24 1132 09/18/24 1322  BP: (!) 114/47 103/71 106/70 104/66  Pulse: 80 85 88 85  Resp: 16 20 16 19   Temp: 98.6 F (37 C) 98.1 F (36.7 C) 97.8 F (36.6 C) 97.7 F (36.5 C)  TempSrc: Oral Oral Oral Oral  SpO2: 100% 97% 99%   Weight:    83.8 kg  Height:       No intake or output data in the 24 hours ending 09/18/24 1352 Filed Weights   09/17/24 1754 09/18/24 0421 09/18/24 1322  Weight: 78 kg 85.4 kg 83.8 kg    Data Reviewed: I have personally reviewed and interpreted daily labs, tele strips, imagings as discussed above. I reviewed all nursing notes, pharmacy notes, vitals, pertinent old records I have discussed plan of care  as described above with RN and patient/family.  CBC: Recent Labs  Lab 09/17/24 1800 09/18/24 0558  WBC 7.4 7.0  NEUTROABS 6.2  --   HGB 9.9* 9.3*  HCT 32.5* 31.3*  MCV 101.2* 101.3*  PLT 275 252   Basic Metabolic Panel: Recent Labs  Lab 09/17/24 1800 09/18/24 0558  NA 139 138  K 5.3* 5.3*  CL 90* 91*  CO2 22 19*  GLUCOSE 200* 153*  BUN 44* 47*  CREATININE 5.20* 5.58*  CALCIUM  10.4* 10.2  MG  --  2.2  PHOS  --  4.5    Studies: MR BRAIN WO CONTRAST Result Date: 09/18/2024 EXAM: MRI BRAIN WITHOUT CONTRAST 09/18/2024 01:10:00 AM TECHNIQUE: Multiplanar multisequence MRI of the head/brain was performed without the administration of intravenous contrast. COMPARISON: None available. CLINICAL HISTORY: Neuro deficit, acute, stroke suspected. FINDINGS: BRAIN AND VENTRICLES: There is a large area of acute ischemia affecting the posterior left MCA territory. Additionally, there are multiple punctate foci of acute ischemia within the centrum semiovale bilaterally. 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 No intracranial hemorrhage. No mass. No midline shift.  No hydrocephalus. The sella is unremarkable. Normal flow voids. ORBITS: No significant abnormality. SINUSES AND MASTOIDS: No significant abnormality. BONES AND SOFT TISSUES: Normal marrow signal. No soft tissue abnormality. IMPRESSION: 1. Large area of acute ischemia affecting the posterior left MCA territory. 2. Multiple punctate foci of acute ischemia within the centrum semiovale bilaterally. 3. 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 Electronically signed by: Franky Stanford MD 09/18/2024 02:02 AM EST RP Workstation: [id number]   CT HEAD WO CONTRAST Result Date: 09/17/2024 EXAM: CT HEAD WITHOUT CONTRAST 09/17/2024 06:21:12 PM TECHNIQUE: CT of the head was performed without the administration of intravenous contrast. Automated exposure control, iterative reconstruction, and/or weight based adjustment of  the mA/kV was utilized to reduce the radiation dose to as low as reasonably achievable. COMPARISON: None available. CLINICAL HISTORY: Neuro deficit, acute, stroke suspected. FINDINGS: BRAIN AND VENTRICLES: Low density in the posterior left temporal and parietal lobes compatible with acute infarction. No hemorrhage. No hydrocephalus. No extra-axial collection. No mass effect or midline shift. ORBITS: No acute abnormality. SINUSES: No acute abnormality. SOFT TISSUES AND SKULL: No acute soft tissue abnormality. No skull fracture. IMPRESSION: 1. Acute infarction in the posterior left temporal and parietal lobes. This could be further evaluated with MRI if felt clinically indicated. Electronically signed by: Franky Crease MD 09/17/2024 06:30 PM EST RP Workstation: [id number]   DG Chest 2 View Result Date: 09/17/2024 EXAM: 2 VIEW(S) XRAY OF THE CHEST 09/17/2024 06:07:00 PM COMPARISON: 08/31/2024 CLINICAL HISTORY: AMS FINDINGS: LINES, TUBES AND DEVICES: Right dialysis catheter remains in place, unchanged. LUNGS AND PLEURA: Low lung volumes. No confluent opacities, effusions, or edema. No pneumothorax. HEART AND MEDIASTINUM: Mild cardiomegaly. Leadless pacer remains in place, unchanged. BONES AND SOFT TISSUES: No acute osseous abnormality. IMPRESSION: 1. No acute cardiopulmonary process. Electronically signed by: Franky Crease MD 09/17/2024 06:21 PM EST RP Workstation: [id number]    Scheduled Meds:  aspirin  EC  81 mg Oral Daily   atorvastatin   20 mg Oral QPM   Chlorhexidine  Gluconate Cloth  6 each Topical Q0600   insulin  aspart  0-6 Units Subcutaneous Q4H   Continuous Infusions:  sodium thiosulfate  25 g in sodium chloride  0.9 % 200 mL Infusion for Calciphylaxis     PRN Meds: acetaminophen  **OR** acetaminophen , alteplase , heparin , HYDROmorphone  (DILAUDID ) injection, ondansetron  **OR** ondansetron  (ZOFRAN ) IV  Time spent: 55 minutes  Author: ELVAN SOR. MD [facility] 09/18/2024 1:52 PM  To reach  On-call, see care teams to locate the attending and reach out to them via [email or web address]. If 7PM-7AM, please contact night-coverage If you still have difficulty reaching the attending provider, please page the [facility] (Director on Call) for [facility] on amion for assistance.

## 2024-09-18 NOTE — Plan of Care (Signed)

## 2024-09-18 NOTE — Consult Note (Signed)
 WOC Nurse Consult Note: patient is followed at wound care center for ongoing management of Calciphylaxis bilateral lower legs; being treated with Xeroform and K2 urgo lite 2 layer compression wraps; [place] does not have K2 urgo lite compression wraps on formulary  Reason for Consult: calciphylaxis  Wound type: Full thickness B lower legs/feet/toes r/t calciphylaxis  Pressure Injury POA: NA  Measurement: per wound care center note 1/12 L anterior leg 9.4 cm x 13 cm x 0.2 cm; R anterior leg 11.5 cm x 13 cm x 0.1 cm; L heel 1.2 cm x 1.3 cm x 0.1 cm  Wound bed:mix of tan slough and eschar to majority of wound beds on review of photos from the wound care center 1/12 Drainage (amount, consistency, odor) see nursing flowsheet  Periwound: erythema  Dressing procedure/placement/frequency: Cut off K2 Urgo lite compression wrap.  Cleanse all foot/toe and leg wounds with Vashe, do not rinse.  Apply Xeroform gauze ([place] [phone]) to wound beds daily, cover with dry gauze/ABD pads depending on location.  Wrap with Kerlix roll gauze beginning at toe wounds in a spiral fashion to right below knees.  Apply Coban or Ace bandage wrapped in same fashion as Kerlix for light compression.  Perform this wound care daily.    Patient should resume follow-up at wound care center as outpatient.    WOC team will not follow. Reconsult if further needs arise.   Thank you,    Powell Bar MSN, RN-BC, [employer]

## 2024-09-18 NOTE — Progress Notes (Addendum)
 Pt's 1600 NIHSS vitals and scale were behind because pt was off unit in dialysis. Dressing change to bialteral lower extremities not done on dayshift due to pt being off unit for MRI then off unit for dialysis. Will report to night shift nurse that this needs to be done

## 2024-09-18 NOTE — Procedures (Signed)
 Post Hemodialysis Report:   Treatment Date: 09/18/2024  Treatment Length: 3 hours  Patient Access:Right HD CVC  Access with poor function, only able to achieve BFR of 200-250, Dr.Singh and [employer], Np made aware during rounding, gave okay to place Alteplase  dwell in CVC post treatment. Sodium thiosulfate  given last hour of treatment.Net removal , due to patient with soft blood pressures in 90s most treatment.  Post Vitals:  Temp: 97.8 Temp Source: oral Heart Rate: 74 Resp:18 B/P:91/61(74) O2:100 Room air

## 2024-09-18 NOTE — Progress Notes (Signed)
 " [facility]  ROUNDING NOTE   Subjective:   [name] s a 59 year old female with past medical conditions including anemia, GERD, hypertension, stroke, diabetes, and end-stage renal disease on hemodialysis. Patient presents to the emergency department for evaluation of altered mental status. She has been admitted for Acute ischemic stroke [facility]) [I63.9] Expressive aphasia [R47.01] Cerebrovascular accident (CVA), unspecified mechanism (HCC) [I63.9]  Patient is known to our practice and receives outpatient dialysis treatments at [facility] MWF schedule, supervised by Dr. Marcelino. Patient is seen sitting up in bed, family at bedside. She is alert and oriented to self only.   Labs on ED arrival include potassium 5.3, BUN 44, creatinine 5.2 with GFR 9, troponin 114, and hemoglobin 9.9.  Chest x-ray negative, CT head shows acute infarction in the posterior left temporal and parietal lobes.  Brain MRI confirms large area of acute ischemia affecting the posterior left MCA and multiple punctuate foci of acute ischemia within the centrum semiovale.   We have been consulted to manage dialysis needs.  Objective:  Vital signs in last 24 hours:  Temp:  [97.6 F (36.4 C)-98.6 F (37 C)] 97.7 F (36.5 C) (01/19 1322) Pulse Rate:  [80-88] 85 (01/19 1500) Resp:  [13-26] 13 (01/19 1500) BP: (91-120)/(47-84) 99/70 (01/19 1430) SpO2:  [91 %-100 %] 100 % (01/19 1500) Weight:  [78 kg-85.4 kg] 83.8 kg (01/19 1322)  Weight change:  Filed Weights   09/17/24 1754 09/18/24 0421 09/18/24 1322  Weight: 78 kg 85.4 kg 83.8 kg    Intake/Output: No intake/output data recorded.   Intake/Output this shift:  No intake/output data recorded.  Physical Exam: General: NAD  Head: Normocephalic, atraumatic. Moist oral mucosal membranes  Eyes: Anicteric  Lungs:  Clear to auscultation  Heart: Regular rate and rhythm, paced  Abdomen:  Soft, nontender  Extremities:  No peripheral edema.   Neurologic: Awake, confusion  Skin: BLE ace bandages  Access: Rt permcath    Basic Metabolic Panel: Recent Labs  Lab 09/17/24 1800 09/18/24 0558  NA 139 138  K 5.3* 5.3*  CL 90* 91*  CO2 22 19*  GLUCOSE 200* 153*  BUN 44* 47*  CREATININE 5.20* 5.58*  CALCIUM  10.4* 10.2  MG  --  2.2  PHOS  --  4.5    Liver Function Tests: Recent Labs  Lab 09/17/24 1800 09/18/24 0558  AST <10* <10*  ALT <5 <5  ALKPHOS 179* 158*  BILITOT 0.5 0.6  PROT 6.7 6.1*  ALBUMIN  3.9 3.6   No results for input(s): LIPASE, AMYLASE in the last 168 hours. No results for input(s): AMMONIA in the last 168 hours.  CBC: Recent Labs  Lab 09/17/24 1800 09/18/24 0558  WBC 7.4 7.0  NEUTROABS 6.2  --   HGB 9.9* 9.3*  HCT 32.5* 31.3*  MCV 101.2* 101.3*  PLT 275 252    Cardiac Enzymes: No results for input(s): CKTOTAL, CKMB, CKMBINDEX, TROPONINI in the last 168 hours.  BNP: Invalid input(s): POCBNP  CBG: Recent Labs  Lab 09/17/24 1837 09/17/24 2005 09/18/24 0447 09/18/24 0820 09/18/24 1150  GLUCAP 186* 162* 152* 153* 164*    Microbiology: Results for orders placed or performed during the hospital encounter of 08/30/24  Blood Culture (routine x 2)     Status: None   Collection Time: 08/30/24  5:21 PM   Specimen: BLOOD  Result Value Ref Range Status   Specimen Description BLOOD BLOOD RIGHT HAND  Final   Special Requests   Final  BOTTLES DRAWN AEROBIC AND ANAEROBIC Blood Culture results may not be optimal due to an inadequate volume of blood received in culture bottles   Culture   Final    NO GROWTH 5 DAYS Performed at [facility], [address] [place], [place] [address]    Report Status 09/04/2024 FINAL  Final  Blood Culture (routine x 2)     Status: None   Collection Time: 08/30/24  5:25 PM   Specimen: BLOOD  Result Value Ref Range Status   Specimen Description BLOOD RIGHT ANTECUBITAL  Final   Special Requests   Final    BOTTLES DRAWN AEROBIC  AND ANAEROBIC Blood Culture adequate volume   Culture   Final    NO GROWTH 5 DAYS Performed at [facility], [address] [place], [place] [address]    Report Status 09/04/2024 FINAL  Final  Aerobic Culture w Gram Stain (superficial specimen)     Status: None   Collection Time: 08/31/24  1:14 AM   Specimen: Leg; Wound  Result Value Ref Range Status   Specimen Description   Final    LEG Performed at [facility], [address], [place], [place] [address]    Special Requests   Final    NONE Performed at [facility], [address] [place], [place] [address]    Gram Stain   Final    NO WBC SEEN FEW GRAM NEGATIVE RODS RARE GRAM POSITIVE RODS RARE GRAM POSITIVE COCCI    Culture   Final    ABUNDANT PSEUDOMONAS ALCALIGENES FEW ENTEROCOCCUS FAECALIS ABUNDANT STENOTROPHOMONAS MALTOPHILIA Sent to [facility] for further susceptibility testing. SEE SEPARATE REPORT Performed at [facility], [address], [place], [place] [address]    Report Status 09/10/2024 FINAL  Final   Organism ID, Bacteria ENTEROCOCCUS FAECALIS  Final   Organism ID, Bacteria PSEUDOMONAS ALCALIGENES  Final   Organism ID, Bacteria PSEUDOMONAS ALCALIGENES  Final      Susceptibility   Enterococcus faecalis - MIC*    AMPICILLIN <=2 SENSITIVE Sensitive     VANCOMYCIN  1 SENSITIVE Sensitive     GENTAMICIN SYNERGY SENSITIVE Sensitive     * FEW ENTEROCOCCUS FAECALIS   Pseudomonas alcaligenes - MIC*    MEROPENEM 8 INTERMEDIATE Intermediate     CIPROFLOXACIN 0.5 SENSITIVE Sensitive     PIP/TAZO Value in next row Intermediate      32 INTERMEDIATEThis is a modified FDA-approved test that has been validated and its performance characteristics determined by the reporting laboratory.  This laboratory is certified under the Clinical Laboratory Improvement Amendments CLIA as qualified to perform high complexity clinical laboratory testing.    CEFEPIME  Value in next row Sensitive      32  INTERMEDIATEThis is a modified FDA-approved test that has been validated and its performance characteristics determined by the reporting laboratory.  This laboratory is certified under the Clinical Laboratory Improvement Amendments CLIA as qualified to perform high complexity clinical laboratory testing.   Pseudomonas alcaligenes - MIC*    CEFOTAXIME (Non-meningitis) Value in next row Intermediate      32 INTERMEDIATEThis is a modified FDA-approved test that has been validated and its performance characteristics determined by the reporting laboratory.  This laboratory is certified under the Clinical Laboratory Improvement Amendments CLIA as qualified to perform high complexity clinical laboratory testing.    * ABUNDANT PSEUDOMONAS ALCALIGENES    ABUNDANT PSEUDOMONAS ALCALIGENES  Susceptibility, Aer + Anaerob     Status: Abnormal   Collection Time:  08/31/24  1:14 AM  Result Value Ref Range Status   Suscept, Aer + Anaerob Final report (A)  Corrected    Comment: (NOTE) Performed At: [facility] [address] [place], [place] [id number] Jennette Shorter MD Ey:[id number] CORRECTED ON 01/11 AT 0736: PREVIOUSLY REPORTED AS Preliminary report    Source PENDING  Incomplete  Susceptibility Result     Status: Abnormal   Collection Time: 08/31/24  1:14 AM  Result Value Ref Range Status   Suscept Result 1 Comment (A)  Final    Comment: (NOTE) Stenotrophomonas maltophilia Identification performed by account, not confirmed by this laboratory. Levofloxacin and Trimethoprim-Sulfamethoxazole should not be used alone for antimicrobial therapy (CLSI).    Antimicrobial Suscept Comment  Corrected    Comment: (NOTE)      ** S = Susceptible; I = Intermediate; R = Resistant **                   P = Positive; N = Negative            MICS are expressed in micrograms per mL   Antibiotic                 RSLT#1    RSLT#2    RSLT#3    RSLT#4 Levofloxacin                   S Minocycline                     S Trimethoprim/Sulfa             S Performed At: [facility] [employer] [address] [place], [place] [id number] Jennette Shorter MD Ey:[id number]     Coagulation Studies: Recent Labs    09/17/24 1800  LABPROT 16.6*  INR 1.3*    Urinalysis: No results for input(s): COLORURINE, LABSPEC, PHURINE, GLUCOSEU, HGBUR, BILIRUBINUR, KETONESUR, PROTEINUR, UROBILINOGEN, NITRITE, LEUKOCYTESUR in the last 72 hours.  Invalid input(s): APPERANCEUR    Imaging: MR BRAIN WO CONTRAST Result Date: 09/18/2024 EXAM: MRI BRAIN WITHOUT CONTRAST 09/18/2024 01:10:00 AM TECHNIQUE: Multiplanar multisequence MRI of the head/brain was performed without the administration of intravenous contrast. COMPARISON: None available. CLINICAL HISTORY: Neuro deficit, acute, stroke suspected. FINDINGS: BRAIN AND VENTRICLES: There is a large area of acute ischemia affecting the posterior left MCA territory. Additionally, there are multiple punctate foci of acute ischemia within the centrum semiovale bilaterally. 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 No intracranial hemorrhage. No mass. No midline shift. No hydrocephalus. The sella is unremarkable. Normal flow voids. ORBITS: No significant abnormality. SINUSES AND MASTOIDS: No significant abnormality. BONES AND SOFT TISSUES: Normal marrow signal. No soft tissue abnormality. IMPRESSION: 1. Large area of acute ischemia affecting the posterior left MCA territory. 2. Multiple punctate foci of acute ischemia within the centrum semiovale bilaterally. 3. 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 Electronically signed by: Franky Stanford MD 09/18/2024 02:02 AM EST RP Workstation: [id number]   CT HEAD WO CONTRAST Result Date: 09/17/2024 EXAM: CT HEAD WITHOUT CONTRAST 09/17/2024 06:21:12 PM TECHNIQUE: CT of the head was performed without the administration of intravenous contrast. Automated exposure control, iterative reconstruction,  and/or weight based adjustment of the mA/kV was utilized to reduce the radiation dose to as low as reasonably achievable. COMPARISON: None available. CLINICAL HISTORY: Neuro deficit, acute, stroke suspected. FINDINGS: BRAIN AND VENTRICLES: Low density in the posterior left temporal and parietal lobes compatible with acute infarction. No hemorrhage. No hydrocephalus. No extra-axial collection.  No mass effect or midline shift. ORBITS: No acute abnormality. SINUSES: No acute abnormality. SOFT TISSUES AND SKULL: No acute soft tissue abnormality. No skull fracture. IMPRESSION: 1. Acute infarction in the posterior left temporal and parietal lobes. This could be further evaluated with MRI if felt clinically indicated. Electronically signed by: Franky Crease MD 09/17/2024 06:30 PM EST RP Workstation: [id number]   DG Chest 2 View Result Date: 09/17/2024 EXAM: 2 VIEW(S) XRAY OF THE CHEST 09/17/2024 06:07:00 PM COMPARISON: 08/31/2024 CLINICAL HISTORY: AMS FINDINGS: LINES, TUBES AND DEVICES: Right dialysis catheter remains in place, unchanged. LUNGS AND PLEURA: Low lung volumes. No confluent opacities, effusions, or edema. No pneumothorax. HEART AND MEDIASTINUM: Mild cardiomegaly. Leadless pacer remains in place, unchanged. BONES AND SOFT TISSUES: No acute osseous abnormality. IMPRESSION: 1. No acute cardiopulmonary process. Electronically signed by: Kevin Dover MD 09/17/2024 06:21 PM EST RP Workstation: [id number]     Medications:    sodium thiosulfate  25 g in sodium chloride  0.9 % 200 mL Infusion for Calciphylaxis      aspirin  EC  81 mg Oral Daily   atorvastatin   20 mg Oral QPM   Chlorhexidine  Gluconate Cloth  6 each Topical Q0600   gabapentin   100 mg Oral QHS   heparin  injection (subcutaneous)  5,000 Units Subcutaneous Q8H   insulin  aspart  0-6 Units Subcutaneous Q4H   acetaminophen  **OR** acetaminophen , alteplase , heparin , HYDROmorphone  (DILAUDID ) injection, ondansetron  **OR** ondansetron  (ZOFRAN )  IV  Assessment/ Plan:  [name] is a 59 y.o.  female with past medical conditions including anemia, GERD, hypertension, stroke, diabetes, and end-stage renal disease on hemodialysis. Patient presents to the emergency department for altered mental status.SABRA   [facility]/MWF/Rt permcath  End-stage renal disease on hemodialysis.  Last treatment received on Friday.  Will receive dialysis treatment today, UF goal 1.5 to 2 L as tolerated.  2.  Acute ischemic CVA, confirmed on CT and MRI brain.  Neurology consulted..  Statin initiation along with PT/OT, echo, and neuro checks  3. Anemia of chronic kidney disease Lab Results  Component Value Date   HGB 9.3 (L) 09/18/2024    Hemoglobin acceptable.  Will hold ESA's due to recent stroke.  4. Secondary Hyperparathyroidism: with outpatient labs: None available currently  Lab Results  Component Value Date   CALCIUM  10.2 09/18/2024   CAION 1.00 (L) 03/09/2024   PHOS 4.5 09/18/2024    Calcium  and phosphorus within desired target.   LOS: 0 Aspen Lawrance 1/19/20263:18 PM   "

## 2024-09-18 NOTE — Progress Notes (Signed)
 Came bedside for echo, but patient is still in dialysis.  Will attempt echo some other time.

## 2024-09-18 NOTE — Evaluation (Signed)
 Physical Therapy Evaluation Patient Details Name: [name] MRN: [id number] DOB: 1966-03-31 Today's Date: 09/18/2024  History of Present Illness  59 y/o female presented to ED on 09/17/24 for AMS and expressive aphasia. MRI showed large acute posterior L MCA infarct. PMH: HTN, type 2 diabetes, ESRD on HD MWF, calchiphylaxis, neuropathy  Clinical Impression  Patient admitted with the above. PTA, patient lives with husband and is w/c bound and able to perform stand pivot transfers to/from w/c modI. Patient able to perform step pivot transfer to/from w/c with supervision. Requires assistance to bring BLEs into bed 2/2 weakness and pain which seems to be baseline for patient. Patient is near functional baseline, however has some expressive aphasia noted in conversation. Patient will benefit from skilled PT services during acute stay to address listed deficits. Patient will benefit from ongoing therapy at discharge to maximize functional independence and safety.       If plan is discharge home, recommend the following: A little help with bathing/dressing/bathroom;Assistance with cooking/housework;Assist for transportation;Help with stairs or ramp for entrance   Can travel by private vehicle   Yes    Equipment Recommendations None recommended by PT  Recommendations for Other Services       Functional Status Assessment Patient has had a recent decline in their functional status and demonstrates the ability to make significant improvements in function in a reasonable and predictable amount of time.     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      Mobility  Bed Mobility Overal bed mobility: Needs Assistance Bed Mobility: Sit to Supine        Sit to supine: Mod assist   General bed mobility comments: A to lift BLE back into bed    Transfers Overall transfer level: Needs assistance Equipment used: 1 person hand held assist Transfers: Bed to chair/wheelchair/BSC     Step pivot transfers: Min assist       General transfer comment: min A to transfer from toilet to w/c, using grab bars; transfers from w/c to EOB with 1 person hand held assist    Ambulation/Gait                  Stairs            Wheelchair Mobility     Tilt Bed    Modified Rankin (Stroke Patients Only)       Balance Overall balance assessment: Needs assistance Sitting-balance support: No upper extremity supported, Feet supported Sitting balance-Leahy Scale: Good     Standing balance support: Bilateral upper extremity supported, During functional activity Standing balance-Leahy Scale: Fair                               Pertinent Vitals/Pain Pain Assessment Pain Assessment: Faces Faces Pain Scale: Hurts whole lot Pain Location: B Legs Pain Descriptors / Indicators: Discomfort, Grimacing, Crying Pain Intervention(s): Monitored during session, Repositioned    Home Living Family/patient expects to be discharged to:: Private residence Living Arrangements: Spouse/significant other Available Help at Discharge: Family;Friend(s);Available 24 hours/day Type of Home: House Home Access: Ramped entrance       Home Layout: One level Home Equipment: Crutches;Wheelchair - manual;BSC/3in1;Rollator (4 wheels);Rolling [name] (2 wheels);Electric scooter      Prior Function Prior Level of Function : Independent/Modified Independent  Mobility Comments: w.c bound primarily. squat pivot to/from. Supervision provided by husband ADLs Comments: has been using BSC recently due to pain in her BLE. She sponge bathes     Extremity/Trunk Assessment   Upper Extremity Assessment Upper Extremity Assessment: Defer  to OT evaluation    Lower Extremity Assessment Lower Extremity Assessment: Generalized weakness    Cervical / Trunk Assessment Cervical / Trunk Assessment: Normal  Communication   Communication Communication: Impaired Factors Affecting Communication: Difficulty expressing self    Cognition Arousal: Alert Behavior During Therapy: WFL for tasks assessed/performed   PT - Cognitive impairments: No apparent impairments                       PT - Cognition Comments: patient has mild aphasia noted Following commands: Intact       Cueing Cueing Techniques: Verbal cues     General Comments      Exercises     Assessment/Plan    PT Assessment Patient needs continued PT services  PT Problem List Decreased strength;Decreased activity tolerance;Decreased balance;Decreased mobility;Pain       PT Treatment Interventions DME instruction;Functional mobility training;Therapeutic activities;Therapeutic exercise;Balance training;Neuromuscular re-education;Patient/family education    PT Goals (Current goals can be found in the Care Plan section)  Acute Rehab PT Goals Patient Stated Goal: return home PT Goal Formulation: With patient Time For Goal Achievement: 10/02/24 Potential to Achieve Goals: Good    Frequency Min 1X/week     Co-evaluation               AM-PAC PT 6 Clicks Mobility  Outcome Measure Help needed turning from your back to your side while in a flat bed without using bedrails?: A Little Help needed moving from lying on your back to sitting on the side of a flat bed without using bedrails?: A Little Help needed moving to and from a bed to a chair (including a wheelchair)?: A Little Help needed standing up from a chair using your arms (e.g., wheelchair or bedside chair)?: A Little Help needed to walk in hospital room?: A Little Help needed climbing 3-5 steps with a railing? : A Little 6 Click Score: 18    End of Session   Activity Tolerance:  Patient tolerated treatment well Patient left: in bed;with call bell/phone within reach;with family/visitor present (on EOB) Nurse Communication: Mobility status PT Visit Diagnosis: Unsteadiness on feet (R26.81);Muscle weakness (generalized) (M62.81);Other abnormalities of gait and mobility (R26.89)    Time: 8957-8896 PT Time Calculation (min) (ACUTE ONLY): 21 min   Charges:   PT Evaluation $PT Eval Low Complexity: 1 Low   PT General Charges $$ ACUTE PT VISIT: 1 Visit         Maryanne Finder, PT, DPT Physical Therapist - [facility]  [facility]   Jaquita Bessire A Talecia Sherlin 09/18/2024, 12:31 PM

## 2024-09-18 NOTE — Evaluation (Signed)
 Speech Language Pathology Evaluation Patient Details Name: [name] MRN: [id number] DOB: [birth year] Today's Date: 09/18/2024 Time: 8849-8796 SLP Time Calculation (min) (ACUTE ONLY): 13 min  Problem List:  Patient Active Problem List   Diagnosis Date Noted   Acute ischemic stroke (HCC) 09/17/2024   Complicated wound infection 08/30/2024   Pulmonary hypertension (HCC) 07/25/2024   Presence of heart assist device (HCC) 07/25/2024   Asthma 07/25/2024   Chronic pain syndrome 07/03/2024   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   PAD (peripheral artery disease) 05/01/2024   Polyarthritis 03/22/2024   Type 2 diabetes mellitus with end-stage renal disease (HCC) 03/20/2024   HFrEF (heart failure with reduced ejection fraction) (HCC) 03/20/2024   Mitral regurgitation 03/20/2024   Complete heart block (HCC) 03/02/2024   Osteoporosis with current pathological fracture 12/14/2021   History of hemiarthroplasty of left hip 11/19/2021   History of fracture of left hip 11/15/2021   Anemia of chronic disease 11/14/2021   Diabetic peripheral neuropathy (HCC)    Quadriceps tendon rupture 10/16/2021   ESRD on dialysis (HCC) 09/27/2018   Secondary hyperparathyroidism of renal origin 03/07/2018   Stage 5 chronic renal impairment associated with type 2 diabetes mellitus (HCC) 07/26/2017   Hyperphosphatemia 09/29/2016   Dyslipidemia 08/08/2015   Past Medical History:  Past Medical History:  Diagnosis Date   Allergy    Anemia    vitamin d3 deficiency   Anxiety    Asthma    Calciphylaxis    Chronic kidney disease    End Stage Renal Disease   Diabetes mellitus without complication (HCC)    GERD (gastroesophageal reflux disease)    nothing over last few years   Heart murmur    High serum parathyroid hormone (PTH)    checked through Dialysis   History of kidney stones 2000   Hypertension    Neuromuscular disorder (HCC)    neuropathy in feet   Osteoporosis    PONV (postoperative nausea and vomiting)     severe nausea requiring many doses of post op antiemetics   Past Surgical History:  Past Surgical History:  Procedure Laterality Date   ABDOMINAL HYSTERECTOMY  2007   APPLICATION OF WOUND VAC Left 11/15/2021   Procedure: APPLICATION OF WOUND VAC;  Surgeon: Krasinski, Kevin, MD;  Location: [facility];  Service: Orthopedics;  Laterality: Left;  Prevena 13cm    APPLICATION OF WOUND VAC Right 12/14/2021   Procedure: APPLICATION OF WOUND VAC;  Surgeon: Tobie Priest, MD;  Location: [facility];  Service: Orthopedics;  Laterality: Right;  [id number]   AV FISTULA INSERTION W/ RF MAGNETIC GUIDANCE N/A 12/08/2017   Procedure: AV FISTULA INSERTION W/RF MAGNETIC GUIDANCE;  Surgeon: Jama Cordella MATSU, MD;  Location: [facility];  Service: Cardiovascular;  Laterality: N/A;   DIALYSIS/PERMA CATHETER INSERTION Right 10/03/2021   Procedure: DIALYSIS/PERMA CATHETER INSERTION;  Surgeon: Jama Cordella MATSU, MD;  Location: [facility];  Service: Cardiovascular;  Laterality: Right;   HIP ARTHROPLASTY Left 09/29/2021   Procedure: ARTHROPLASTY BIPOLAR HIP (HEMIARTHROPLASTY);  Surgeon: Leora Lynwood SAUNDERS, MD;  Location: [facility];  Service: Orthopedics;  Laterality: Left;   HIP ARTHROPLASTY Right 12/14/2021   Procedure: ARTHROPLASTY BIPOLAR HIP (HEMIARTHROPLASTY);  Surgeon: Tobie Priest, MD;  Location: [facility];  Service: Orthopedics;  Laterality: Right;   INCISION AND DRAINAGE HIP Left 11/15/2021   Procedure: IRRIGATION AND DEBRIDEMENT LEFT HIP WOUND;  Surgeon: Marchia Drivers, MD;  Location: [facility];  Service: Orthopedics;  Laterality: Left;   PACEMAKER LEADLESS INSERTION N/A 03/09/2024   Procedure: PACEMAKER  LEADLESS INSERTION;  Surgeon: Nancey Eulas BRAVO, MD;  Location: [facility];  Service: Cardiovascular;  Laterality: N/A;   QUADRICEPS TENDON REPAIR Right 10/16/2021   Procedure: REPAIR QUADRICEP TENDON;  Surgeon: Doll Skates, MD;  Location: [facility];  Service: Orthopedics;  Laterality: Right;    ROTATOR CUFF REPAIR Right 2004   SHOULDER CLOSED REDUCTION Right 2004   UPPER EXTREMITY VENOGRAPHY Left 02/15/2018   Procedure: UPPER EXTREMITY VENOGRAPHY;  Surgeon: Jama Cordella MATSU, MD;  Location: [facility];  Service: Cardiovascular;  Laterality: Left;   HPI:  [name] is a 59 y.o. 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   Assessment / Plan / Recommendation Clinical Impression  Pt presents with moderate fluent aphasia suspect Wernicke's Aphasia. As a result, pt's verbal communication is difficult to understand and she is not able to communicate her wants/needs at this d/t frequent use of neologisms (made-up nonsensical words). Extensive education was provided to pt and her husband on the need for 24 hour supervision at discharge d/t inability to communicate and intensive ST services.   More specifically, pt presents with fluent mostly nonsensical speech that flows freely but doesn't convey meaning, is not able to repeat one or two syllable words (battle for bed, jaw for chair, dadullfair for snow ball, avaframer for banana), inability to name basic commons objects, reduced ability to follow 1-step directions, reduced ability to answer yes/no questions and while she does have some awareness of verbal errors, she has reduced overall awareness of level of impairment. Skilled ST services to follow for further language intervention.      SLP Assessment  SLP Recommendation/Assessment: Patient needs continued Speech Language Pathology Services SLP Visit Diagnosis: Aphasia (R47.01)     Assistance Recommended  at Discharge  Frequent or constant Supervision/Assistance  Functional Status Assessment Patient has had a recent decline in their functional status and/or demonstrates limited ability to make significant improvements in function in a reasonable and predictable amount of time  Frequency and Duration min 2x/week  2 weeks      SLP Evaluation Cognition  Overall Cognitive Status: Difficult to assess (d/t severity of language impairment) Arousal/Alertness: Awake/alert Orientation Level: Oriented to person       Comprehension  Auditory Comprehension Overall Auditory Comprehension: Impaired Yes/No Questions: Impaired Basic Biographical Questions: 51-75% accurate Basic Immediate Environment Questions: 50-74% accurate Commands: Impaired One Step Basic Commands: 25-49% accurate Two Step Basic Commands: 25-49% accurate Conversation: Simple Visual Recognition/Discrimination Discrimination: Not tested Reading Comprehension Reading Status: Not tested    Expression Expression Primary Mode of Expression: Verbal Verbal Expression Overall Verbal Expression: Impaired Initiation: No impairment Automatic Speech: Name;Social Response Level of Generative/Spontaneous Verbalization: Sentence Repetition: Impaired Level of Impairment: Word level Naming: Impairment Responsive: Not tested Confrontation: Impaired Convergent: Not tested Divergent: Not tested Verbal Errors: Neologisms;Aware of errors;Not aware of errors;Semantic paraphasias Pragmatics: No impairment Non-Verbal Means of Communication: Not applicable   Oral / Motor  Oral Motor/Sensory Function Overall Oral Motor/Sensory Function: Within functional limits Motor Speech Overall Motor Speech: Appears within functional limits for tasks assessed Respiration: Within functional limits Phonation: Normal Resonance: Within functional limits Articulation: Within functional limitis Intelligibility: Intelligible Motor Planning: Within  functional limits  Motor Speech Errors: Not applicable           Sabrinna Yearwood B. Rubbie, M.S., CCC-SLP, Tree Surgeon Certified Brain Injury Specialist [facility]  [facility] Rehabilitation Services Office [phone] Ascom [phone] Fax [phone]

## 2024-09-18 NOTE — Consult Note (Signed)
 Requesting Physician: Von    Chief Complaint: Aphasia  I have been asked by Dr. Von to see this patient in consultation for acute infarct.  HPI: [name] is an 59 y.o. 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 Patient was playing games on the computer when she suddenly started to slur her words and became unable to use the computer correctly.  Was initially felt to be secondary to some additional doses of Neurontin  she had taken but with continued difficulty patient presented for evaluation.  Date last known well: 09/16/2024 Time last known well: Time: 22:30 tPA Given: No: Outside time window Thrombolytic candidate: No, exam not consistent with LVO mRankin: 0  Past Medical History:  Diagnosis Date   Allergy    Anemia    vitamin d3 deficiency   Anxiety    Asthma    Calciphylaxis    Chronic kidney disease    End Stage Renal Disease   Diabetes mellitus without complication (HCC)    GERD (gastroesophageal reflux disease)    nothing over last few years   Heart murmur    High serum parathyroid hormone (PTH)    checked through Dialysis   History of kidney stones 2000   Hypertension    Neuromuscular disorder (HCC)    neuropathy in feet   Osteoporosis    PONV (postoperative nausea and vomiting)    severe nausea requiring many doses of post op antiemetics    Past Surgical History:  Procedure Laterality Date   ABDOMINAL HYSTERECTOMY  2007   APPLICATION OF WOUND VAC Left 11/15/2021   Procedure: APPLICATION OF WOUND VAC;  Surgeon: Marchia Drivers, MD;  Location: [facility];  Service: Orthopedics;  Laterality: Left;  Prevena 13cm    APPLICATION OF WOUND VAC Right 12/14/2021   Procedure: APPLICATION OF WOUND VAC;  Surgeon: Tobie Priest, MD;  Location: [facility];  Service: Orthopedics;  Laterality: Right;  [id number]   AV FISTULA INSERTION W/ RF MAGNETIC  GUIDANCE N/A 12/08/2017   Procedure: AV FISTULA INSERTION W/RF MAGNETIC GUIDANCE;  Surgeon: Jama Cordella MATSU, MD;  Location: [facility];  Service: Cardiovascular;  Laterality: N/A;   DIALYSIS/PERMA CATHETER INSERTION Right 10/03/2021   Procedure: DIALYSIS/PERMA CATHETER INSERTION;  Surgeon: Jama Cordella MATSU, MD;  Location: [facility];  Service: Cardiovascular;  Laterality: Right;   HIP ARTHROPLASTY Left 09/29/2021   Procedure: ARTHROPLASTY BIPOLAR HIP (HEMIARTHROPLASTY);  Surgeon: Leora Lynwood SAUNDERS, MD;  Location: [facility];  Service: Orthopedics;  Laterality: Left;   HIP ARTHROPLASTY Right 12/14/2021   Procedure: ARTHROPLASTY BIPOLAR HIP (HEMIARTHROPLASTY);  Surgeon: Tobie Priest, MD;  Location: [facility];  Service: Orthopedics;  Laterality: Right;   INCISION AND DRAINAGE HIP Left 11/15/2021   Procedure: IRRIGATION AND DEBRIDEMENT LEFT HIP WOUND;  Surgeon: Marchia Drivers, MD;  Location: [facility];  Service: Orthopedics;  Laterality: Left;   PACEMAKER LEADLESS INSERTION N/A 03/09/2024   Procedure: PACEMAKER LEADLESS INSERTION;  Surgeon: Nancey Eulas BRAVO, MD;  Location: [facility];  Service: Cardiovascular;  Laterality: N/A;   QUADRICEPS TENDON REPAIR Right 10/16/2021   Procedure: REPAIR QUADRICEP TENDON;  Surgeon: Doll Skates, MD;  Location: [facility];  Service: Orthopedics;  Laterality: Right;   ROTATOR CUFF REPAIR Right 2004   SHOULDER CLOSED REDUCTION Right 2004   UPPER EXTREMITY VENOGRAPHY Left 02/15/2018   Procedure: UPPER EXTREMITY VENOGRAPHY;  Surgeon: Jama Cordella MATSU, MD;  Location: [facility]  INVASIVE CV LAB;  Service: Cardiovascular;  Laterality: Left;    Family History  Problem Relation Age of Onset   Emphysema Mother    COPD Mother    Cancer Father    Cancer Paternal Aunt    Diabetes Sister    Hypertension Sister    Eczema Sister    Diabetes Brother    Hypertension Brother    Cardiomyopathy Brother    Alcohol abuse Brother    Social History:  reports  that she has never smoked. She has never used smokeless tobacco. She reports that she does not drink alcohol and does not use drugs.  Allergies: Allergies[1]  Medications:  Prior to Admission medications  Medication Sig Start Date End Date Taking? Authorizing Provider  albuterol  (VENTOLIN  HFA) 108 (90 Base) MCG/ACT inhaler Inhale 2 puffs into the lungs every 6 (six) hours as needed for wheezing or shortness of breath. 02/20/24  Yes [name], MD  B Complex-C-Folic Acid  (RENA-VITE PO) Take 1 tablet by mouth daily. 05/09/24  Yes [provider]  Dupilumab 300 MG/2ML SOAJ Inject 300 mg into the skin. 05/19/24  Yes [provider]  gabapentin  (NEURONTIN ) 100 MG capsule Take 1 capsule (100 mg total) by mouth at bedtime. 09/02/24  Yes Wouk, Devaughn Sayres, MD  ipratropium-albuterol  (DUONEB) 0.5-2.5 (3) MG/3ML SOLN Take 3 mLs by nebulization 4 (four) times daily. Patient taking differently: Take 3 mLs by nebulization every 6 (six) hours as needed. Only as needed   Yes [provider]  naloxone  (NARCAN ) nasal spray 4 mg/0.1 mL Place 1 spray into the nose as needed for up to 365 doses (for opioid-induced respiratory depresssion). In case of emergency (overdose), spray once into each nostril. If no response within 3 minutes, repeat application and call 911. 07/10/24 07/10/25 Yes Patel, Seema K, NP  NOVOLOG  FLEXPEN 100 UNIT/ML FlexPen Inject 14 Units into the skin 3 (three) times daily with meals.   Yes [provider]  oxyCODONE  (OXY IR/ROXICODONE ) 5 MG immediate release tablet Take 1 tablet (5 mg total) by mouth every 12 (twelve) hours as needed for severe pain (pain score 7-10). Must last 30 days. 09/10/24 10/10/24 Yes Patel, Seema K, NP  pentoxifylline  (TRENTAL ) 400 MG CR tablet Take 1 tablet (400 mg total) by mouth daily. 05/09/24  Yes Claudene Lehmann, MD  sevelamer  carbonate (RENVELA ) 800 MG tablet Take 2,400 mg by mouth 3 (three) times daily with meals. 12/18/23  Yes  [provider]  SODIUM THIOSULFATE  IV Inject 12.5 mg into the vein 3 (three) times a week.   Yes [provider]  Tenapanor HCl, CKD, 30 MG TABS Take 30 mg by mouth 2 (two) times daily.   Yes [provider]  Continuous Glucose Sensor (DEXCOM G7 SENSOR) MISC Use to check blood sugar as needed for insulin  dependent type 2 diabetes 07/25/24   Franchot Isaiah LABOR, MD  Continuous Glucose Sensor (DEXCOM G7 SENSOR) MISC Use to monitor blood sugar 07/26/24   Franchot Isaiah LABOR, MD  oxyCODONE  (OXY IR/ROXICODONE ) 5 MG immediate release tablet Take 1 tablet (5 mg total) by mouth every 12 (twelve) hours as needed for severe pain (pain score 7-10). Must last 30 days. 10/10/24 11/09/24  Patel, Seema K, NP  oxyCODONE  (OXY IR/ROXICODONE ) 5 MG immediate release tablet Take 1 tablet (5 mg total) by mouth every 12 (twelve) hours as needed for severe pain (pain score 7-10). Must last 30 days. 11/09/24 12/09/24  Patel, Seema K, NP     ROS: History obtained  from the patient  General ROS: negative for - chills, fatigue, fever, night sweats, weight gain or weight loss Psychological ROS: negative for - behavioral disorder, hallucinations, memory difficulties, mood swings or suicidal ideation Ophthalmic ROS: negative for - blurry vision, double vision, eye pain or loss of vision ENT ROS: negative for - epistaxis, nasal discharge, oral lesions, sore throat, tinnitus or vertigo Allergy and Immunology ROS: negative for - hives or itchy/watery eyes Hematological and Lymphatic ROS: negative for - bleeding problems, bruising or swollen lymph nodes Endocrine ROS: negative for - galactorrhea, hair pattern changes, polydipsia/polyuria or temperature intolerance Respiratory ROS: negative for - cough, hemoptysis, shortness of breath or wheezing Cardiovascular ROS: negative for - chest pain, dyspnea on exertion, edema or irregular heartbeat Gastrointestinal ROS: negative for - abdominal pain, diarrhea,  hematemesis, nausea/vomiting or stool incontinence Genito-Urinary ROS: negative for - dysuria, hematuria, incontinence or urinary frequency/urgency Musculoskeletal ROS: leg pain Neurological ROS: as noted in HPI Dermatological ROS: negative for rash and skin lesion changes   Physical Examination: Blood pressure 103/71, pulse 85, temperature 98.1 F (36.7 C), temperature source Oral, resp. rate 20, height 5' 7 (1.702 m), weight 85.4 kg, SpO2 97%.  HEENT-  Normocephalic, no lesions, without obvious abnormality.  Normal external eye and conjunctiva.  Normal external ears. Normal external nose, mucus membranes and septum. Cardiovascular- Single S1, S2 Lungs- CTA Abdomen- soft, non-tender; bowel sounds normal; no masses,  no organomegaly Extremities- leg wrapped Musculoskeletal-no joint tenderness, deformity or swelling Skin-warm and dry, no hyperpigmentation, vitiligo, or suspicious lesions  Neurological Examination   Mental Status: Alert.  Expressive aphasia.  Needs reinforcement to follow complex commands.  Example: asked to raise left ar mand show me her pinky.  She raised the left arm and showed me her middle finger.  Cranial Nerves: II: Visual fields grossly normal, pupils equal, round, reactive to light and accommodation III,IV, VI: ptosis not present, extra-ocular motions intact bilaterally V,VII: smile symmetric, facial light touch sensation normal bilaterally VIII: hearing normal bilaterally XI: bilateral shoulder shrug XII: midline tongue extension Motor: 5/5 in the BUEs.  Lower extremity testing limited by pain.   Sensory: Pinprick and light touch intact throughout, bilaterally Deep Tendon Reflexes: 2+ in the upper extremities and absent in the lower extremities.   Plantars: Right: mute   Left: mute Cerebellar: Normal finger-to-nose testing bilaterally Gait: not tested due to lower extremity pain   NIHSS of 7.  Unable to lift either leg off the bed and maintain.    Laboratory Studies:  Basic Metabolic Panel: Recent Labs  Lab 09/17/24 1800 09/18/24 0558  NA 139 138  K 5.3* 5.3*  CL 90* 91*  CO2 22 19*  GLUCOSE 200* 153*  BUN 44* 47*  CREATININE 5.20* 5.58*  CALCIUM  10.4* 10.2  MG  --  2.2  PHOS  --  4.5    Liver Function Tests: Recent Labs  Lab 09/17/24 1800 09/18/24 0558  AST <10* <10*  ALT <5 <5  ALKPHOS 179* 158*  BILITOT 0.5 0.6  PROT 6.7 6.1*  ALBUMIN  3.9 3.6   No results for input(s): LIPASE, AMYLASE in the last 168 hours. No results for input(s): AMMONIA in the last 168 hours.  CBC: Recent Labs  Lab 09/17/24 1800 09/18/24 0558  WBC 7.4 7.0  NEUTROABS 6.2  --   HGB 9.9* 9.3*  HCT 32.5* 31.3*  MCV 101.2* 101.3*  PLT 275 252    Cardiac Enzymes: No results for input(s): CKTOTAL, CKMB, CKMBINDEX, TROPONINI in the last 168  hours.  BNP: Invalid input(s): POCBNP  CBG: Recent Labs  Lab 09/17/24 1837 09/17/24 2005 09/18/24 0447 09/18/24 0820  GLUCAP 186* 162* 152* 153*    Microbiology: Results for orders placed or performed during the hospital encounter of 08/30/24  Blood Culture (routine x 2)     Status: None   Collection Time: 08/30/24  5:21 PM   Specimen: BLOOD  Result Value Ref Range Status   Specimen Description BLOOD BLOOD RIGHT HAND  Final   Special Requests   Final    BOTTLES DRAWN AEROBIC AND ANAEROBIC Blood Culture results may not be optimal due to an inadequate volume of blood received in culture bottles   Culture   Final    NO GROWTH 5 DAYS Performed at [facility], [address], [place], [place] [address]    Report Status 09/04/2024 FINAL  Final  Blood Culture (routine x 2)     Status: None   Collection Time: 08/30/24  5:25 PM   Specimen: BLOOD  Result Value Ref Range Status   Specimen Description BLOOD RIGHT ANTECUBITAL  Final   Special Requests   Final    BOTTLES DRAWN AEROBIC AND ANAEROBIC Blood Culture adequate volume   Culture   Final    NO GROWTH  5 DAYS Performed at [facility], [address] [place], [place] [address]    Report Status 09/04/2024 FINAL  Final  Aerobic Culture w Gram Stain (superficial specimen)     Status: None   Collection Time: 08/31/24  1:14 AM   Specimen: Leg; Wound  Result Value Ref Range Status   Specimen Description   Final    LEG Performed at [facility], [address] [place], [place] [address]    Special Requests   Final    NONE Performed at [facility], [address] [place], [place] [address]    Gram Stain   Final    NO WBC SEEN FEW GRAM NEGATIVE RODS RARE GRAM POSITIVE RODS RARE GRAM POSITIVE COCCI    Culture   Final    ABUNDANT PSEUDOMONAS ALCALIGENES FEW ENTEROCOCCUS FAECALIS ABUNDANT STENOTROPHOMONAS MALTOPHILIA Sent to [facility] for further susceptibility testing. SEE SEPARATE REPORT Performed at [facility], [address] [place], [place] [address]    Report Status 09/10/2024 FINAL  Final   Organism ID, Bacteria ENTEROCOCCUS FAECALIS  Final   Organism ID, Bacteria PSEUDOMONAS ALCALIGENES  Final   Organism ID, Bacteria PSEUDOMONAS ALCALIGENES  Final      Susceptibility   Enterococcus faecalis - MIC*    AMPICILLIN <=2 SENSITIVE Sensitive     VANCOMYCIN  1 SENSITIVE Sensitive     GENTAMICIN SYNERGY SENSITIVE Sensitive     * FEW ENTEROCOCCUS FAECALIS   Pseudomonas alcaligenes - MIC*    MEROPENEM 8 INTERMEDIATE Intermediate     CIPROFLOXACIN 0.5 SENSITIVE Sensitive     PIP/TAZO Value in next row Intermediate      32 INTERMEDIATEThis is a modified FDA-approved test that has been validated and its performance characteristics determined by the reporting laboratory.  This laboratory is certified under the Clinical Laboratory Improvement Amendments CLIA as qualified to perform high complexity clinical laboratory testing.    CEFEPIME  Value in next row Sensitive      32 INTERMEDIATEThis is a modified FDA-approved test that has been validated and  its performance characteristics determined by the reporting laboratory.  This laboratory is certified under the Clinical Laboratory Improvement Amendments CLIA as qualified to perform high complexity clinical  laboratory testing.   Pseudomonas alcaligenes - MIC*    CEFOTAXIME (Non-meningitis) Value in next row Intermediate      32 INTERMEDIATEThis is a modified FDA-approved test that has been validated and its performance characteristics determined by the reporting laboratory.  This laboratory is certified under the Clinical Laboratory Improvement Amendments CLIA as qualified to perform high complexity clinical laboratory testing.    * ABUNDANT PSEUDOMONAS ALCALIGENES    ABUNDANT PSEUDOMONAS ALCALIGENES  Susceptibility, Aer + Anaerob     Status: Abnormal   Collection Time: 08/31/24  1:14 AM  Result Value Ref Range Status   Suscept, Aer + Anaerob Final report (A)  Corrected    Comment: (NOTE) Performed At: [facility] [address] [place], [place] [id number] Jennette Shorter MD Ey:[id number] CORRECTED ON 01/11 AT 0736: PREVIOUSLY REPORTED AS Preliminary report    Source PENDING  Incomplete  Susceptibility Result     Status: Abnormal   Collection Time: 08/31/24  1:14 AM  Result Value Ref Range Status   Suscept Result 1 Comment (A)  Final    Comment: (NOTE) Stenotrophomonas maltophilia Identification performed by account, not confirmed by this laboratory. Levofloxacin and Trimethoprim-Sulfamethoxazole should not be used alone for antimicrobial therapy (CLSI).    Antimicrobial Suscept Comment  Corrected    Comment: (NOTE)      ** S = Susceptible; I = Intermediate; R = Resistant **                   P = Positive; N = Negative            MICS are expressed in micrograms per mL   Antibiotic                 RSLT#1    RSLT#2    RSLT#3    RSLT#4 Levofloxacin                   S Minocycline                    S Trimethoprim/Sulfa             S Performed At: [facility] [employer] [address] [place], [place] [id number] Jennette Shorter MD Ey:[id number]     Coagulation Studies: Recent Labs    09/17/24 1800  LABPROT 16.6*  INR 1.3*    Urinalysis: No results for input(s): COLORURINE, LABSPEC, PHURINE, GLUCOSEU, HGBUR, BILIRUBINUR, KETONESUR, PROTEINUR, UROBILINOGEN, NITRITE, LEUKOCYTESUR in the last 168 hours.  Invalid input(s): APPERANCEUR  Lipid Panel:    Component Value Date/Time   CHOL 97 09/18/2024 0558   CHOL 119 07/25/2024 1511   TRIG 102 09/18/2024 0558   HDL 44 09/18/2024 0558   HDL 67 07/25/2024 1511   CHOLHDL 2.2 09/18/2024 0558   VLDL 20 09/18/2024 0558   LDLCALC 32 09/18/2024 0558   LDLCALC 33 07/25/2024 1511   LDLCALC  04/25/2018 1300     Comment:     . LDL cholesterol not calculated. Triglyceride levels greater than 400 mg/dL invalidate calculated LDL results. . Reference range: <100 . Desirable range <100 mg/dL for primary prevention;   <70 mg/dL for patients with CHD or diabetic patients  with > or = 2 CHD risk factors. SABRA LDL-C is now calculated using the Martin-Hopkins  calculation, which is a validated novel method providing  better accuracy than the Friedewald equation in the  estimation of LDL-C.  Gladis APPLETHWAITE et al. SANDREA. 7986;689(80): 2061-2068  (http://education.[employer].com/faq/FAQ164)  HgbA1C:  Lab Results  Component Value Date   HGBA1C 6.7 (H) 09/18/2024    Urine Drug Screen:  No results found for: LABOPIA, COCAINSCRNUR, LABBENZ, AMPHETMU, THCU, LABBARB  Alcohol Level:  Recent Labs  Lab 09/17/24 1800  ETH <15    Other results: EKG: ventricular paced rhythm  Imaging: MR BRAIN WO CONTRAST Result Date: 09/18/2024 EXAM: MRI BRAIN WITHOUT CONTRAST 09/18/2024 01:10:00 AM TECHNIQUE: Multiplanar multisequence MRI of the head/brain was performed without the administration of intravenous contrast. COMPARISON: None available. CLINICAL HISTORY: Neuro  deficit, acute, stroke suspected. FINDINGS: BRAIN AND VENTRICLES: There is a large area of acute ischemia affecting the posterior left MCA territory. Additionally, there are multiple punctate foci of acute ischemia within the centrum semiovale bilaterally. 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 No intracranial hemorrhage. No mass. No midline shift. No hydrocephalus. The sella is unremarkable. Normal flow voids. ORBITS: No significant abnormality. SINUSES AND MASTOIDS: No significant abnormality. BONES AND SOFT TISSUES: Normal marrow signal. No soft tissue abnormality. IMPRESSION: 1. Large area of acute ischemia affecting the posterior left MCA territory. 2. Multiple punctate foci of acute ischemia within the centrum semiovale bilaterally. 3. 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 Electronically signed by: Franky Stanford MD 09/18/2024 02:02 AM EST RP Workstation: [id number]   CT HEAD WO CONTRAST Result Date: 09/17/2024 EXAM: CT HEAD WITHOUT CONTRAST 09/17/2024 06:21:12 PM TECHNIQUE: CT of the head was performed without the administration of intravenous contrast. Automated exposure control, iterative reconstruction, and/or weight based adjustment of the mA/kV was utilized to reduce the radiation dose to as low as reasonably achievable. COMPARISON: None available. CLINICAL HISTORY: Neuro deficit, acute, stroke suspected. FINDINGS: BRAIN AND VENTRICLES: Low density in the posterior left temporal and parietal lobes compatible with acute infarction. No hemorrhage. No hydrocephalus. No extra-axial collection. No mass effect or midline shift. ORBITS: No acute abnormality. SINUSES: No acute abnormality. SOFT TISSUES AND SKULL: No acute soft tissue abnormality. No skull fracture. IMPRESSION: 1. Acute infarction in the posterior left temporal and parietal lobes. This could be further evaluated with MRI if felt clinically indicated. Electronically signed by: Franky Crease MD 09/17/2024  06:30 PM EST RP Workstation: [id number]   DG Chest 2 View Result Date: 09/17/2024 EXAM: 2 VIEW(S) XRAY OF THE CHEST 09/17/2024 06:07:00 PM COMPARISON: 08/31/2024 CLINICAL HISTORY: AMS FINDINGS: LINES, TUBES AND DEVICES: Right dialysis catheter remains in place, unchanged. LUNGS AND PLEURA: Low lung volumes. No confluent opacities, effusions, or edema. No pneumothorax. HEART AND MEDIASTINUM: Mild cardiomegaly. Leadless pacer remains in place, unchanged. BONES AND SOFT TISSUES: No acute osseous abnormality. IMPRESSION: 1. No acute cardiopulmonary process. Electronically signed by: Franky Crease MD 09/17/2024 06:21 PM EST RP Workstation: [id number]    Assessment: 59 y.o. 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 Found to be aphasic.  No other neurological findings on examination.  MRI of the brain personally reviewed and reveals a large posterior left MCA territory infarct and multiple small, punctate acute infarcts in the centrum semiovale bilaterally.  Etiology likely cardioembolic.  Echocardiogram is pending.  MRA of the head and neck pending.   A1c 6.7, LDL 32.  BP controlled  Stroke Risk Factors - diabetes mellitus and hypertension  Plan: 1. High intensity statin initiation for stroke prophylaxis.  2. MRA of the head and neck 3. PT consult, OT consult, Speech consult 4. Echocardiogram 5. Prophylactic therapy-ASA 81mg  daily 6.  NPO until RN stroke swallow screen 7. Telemetry monitoring 8. Frequent neuro checks  Case discussed with Dr. Von Sonny Hock, MD Neurology  09/18/2024, 10:56 AM          [1]  Allergies Allergen Reactions   Enalapril Hives and Other (See Comments)    Angioedema face.   Ivp Dye [Iodinated Contrast Media] Hives   Lisinopril Shortness Of Breath   Prednisone  Shortness Of Breath

## 2024-09-18 NOTE — Evaluation (Signed)
 Occupational Therapy Evaluation Patient Details Name: [name] MRN: [id number] DOB: 24-Dec-1965 Today's Date: 09/18/2024   History of Present Illness   [name] is a 59 y.o. 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     Clinical Impressions Patient was seen for OT/PT evaluation this date. Prior to hospital admission, patient was receiving A from spouse for ADLs (min A/CGA) patient primarily limited by pain in BLE due to calciphylaxis; due to pain patient primarily uses w/c for mobility and performs SPT to/from w/c. Spouse is present and involved and willing to provide care as needed. Patient states she is not far from baseline with ADL/transfers after CVA, however currently presents with significant R visual field cut/homonymous hemianopsia. Her UE strength/coordination/sensation is WNL and equal bilaterally.  Patient is currently requiring min-mod A for ADLs.  Paient would benefit from skilled OT services to address noted impairments and functional limitations (see below for any additional details) in order to maximize safety and independence while minimizing future risk of falls, injury, and readmission.  Anticipate the need for follow up OT services upon acute hospital DC.      If plan is discharge home, recommend the following:   A little help with walking and/or transfers;A little help with bathing/dressing/bathroom;Assistance with cooking/housework;Assist for transportation;Help with stairs or ramp for entrance     Functional Status Assessment   Patient has had a recent decline in their functional  status and demonstrates the ability to make significant improvements in function in a reasonable and predictable amount of time.     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     Mobility Bed Mobility Overal bed mobility: Needs Assistance Bed Mobility: Sit to Supine       Sit to supine: Mod assist   General bed mobility comments: A to lift BLE back into bed    Transfers Overall transfer level: Needs assistance Equipment used: 1 person hand held assist Transfers: Bed to chair/wheelchair/BSC       Step pivot transfers: Min assist     General transfer comment: min A to transfer from toilet to w/c, using grab bars; transfers from w/c to EOB with 1 person hand held assist      Balance Overall balance assessment: Needs assistance Sitting-balance support: No upper extremity supported, Feet supported Sitting balance-Leahy Scale: Good     Standing balance support: Bilateral upper extremity supported, During functional activity Standing balance-Leahy Scale: Fair                             ADL either performed or assessed with clinical judgement   ADL Overall ADL's : Needs assistance/impaired     Grooming: Minimal assistance   Upper Body Bathing: Minimal assistance   Lower Body Bathing: Minimal assistance   Upper Body Dressing : Minimal assistance   Lower Body Dressing: Minimal assistance  Toilet Transfer: Minimal assistance;Regular Toilet;Grab bars             General ADL Comments: ancipate patient requires min A for all tasks due to pain and poor activity tolerance     Vision Baseline  Vision/History: 1 Wears glasses Patient Visual Report: Peripheral vision impairment Additional Comments: presents with R visual field cut/ homonymous hemianopsia to the R     Perception Perception: Impaired Preception Impairment Details: Inattention/Neglect Perception-Other Comments: to the R   Praxis Praxis: WFL       Pertinent Vitals/Pain Pain Assessment Pain Assessment: Faces Faces Pain Scale: Hurts whole lot Pain Location: B Legs Pain Descriptors / Indicators: Discomfort, Grimacing, Crying Pain Intervention(s): Repositioned     Extremity/Trunk Assessment Upper Extremity Assessment Upper Extremity Assessment: Overall WFL for tasks assessed           Communication Communication Communication: Impaired Factors Affecting Communication: Difficulty expressing self   Cognition Arousal: Alert Behavior During Therapy: WFL for tasks assessed/performed Cognition: No apparent impairments             OT - Cognition Comments: patient has mild aphasia however cognition does not seem impaired                 Following commands: Intact       Cueing  General Comments   Cueing Techniques: Verbal cues      Exercises     Shoulder Instructions      Home Living Family/patient expects to be discharged to:: Private residence Living Arrangements: Spouse/significant other Available Help at Discharge: Family;Friend(s);Available 24 hours/day Type of Home: House Home Access: Ramped entrance     Home Layout: One level     Bathroom Shower/Tub: Sponge bathes at baseline   Bathroom Toilet: Handicapped height Bathroom Accessibility: No   Home Equipment: Crutches;Wheelchair - manual;BSC/3in1;Rollator (4 wheels);Rolling Walker (2 wheels);Electric scooter          Prior Functioning/Environment Prior Level of Function : Independent/Modified Independent             Mobility Comments: w.c bound primarily. squat pivot to/from. Supervision provided by  husband ADLs Comments: has been using BSC recently due to pain in her BLE. She sponge bathes    OT Problem List: Impaired balance (sitting and/or standing);Impaired vision/perception   OT Treatment/Interventions: Self-care/ADL training;Therapeutic exercise;Energy conservation;Neuromuscular education;Therapeutic activities      OT Goals(Current goals can be found in the care plan section)   Acute Rehab OT Goals Patient Stated Goal: none stated OT Goal Formulation: With patient Time For Goal Achievement: 10/02/24 Potential to Achieve Goals: Good ADL Goals Pt Will Perform Grooming: with min assist;sitting Pt Will Perform Lower Body Dressing: with contact guard assist;sit to/from stand Pt Will Transfer to Toilet: with contact guard assist;regular height toilet;grab bars Pt Will Perform Toileting - Clothing Manipulation and hygiene: with min assist;with contact guard assist;sit to/from stand   OT Frequency:  Min 2X/week    Co-evaluation              AM-PAC OT 6 Clicks Daily Activity     Outcome Measure Help from another person eating meals?: A Little Help from another person taking care of personal grooming?: A Little Help from another person toileting, which includes using toliet, bedpan, or urinal?: A Little Help from another person bathing (including washing, rinsing, drying)?: A Little Help from another person to put on and taking off regular upper body clothing?: A Little Help from another person to put on and taking off regular  lower body clothing?: A Lot 6 Click Score: 17   End of Session Equipment Utilized During Treatment: Other (comment) (wheelchair) Nurse Communication: Mobility status  Activity Tolerance: Patient tolerated treatment well Patient left: in bed;with family/visitor present;with call bell/phone within reach  OT Visit Diagnosis: Muscle weakness (generalized) (M62.81);Other (comment) (visual field cut)                Time: 8957-8896 OT Time  Calculation (min): 21 min Charges:  OT General Charges $OT Visit: 1 Visit OT Evaluation $OT Eval Low Complexity: 1 Low  Rogers Clause, OT/L MSOT, 09/18/2024

## 2024-09-18 NOTE — Evaluation (Signed)
 Clinical/Bedside Swallow Evaluation Patient Details  Name: [name] MRN: [id number] Date of Birth: September 21, 1965  Today's Date: 09/18/2024 Time: SLP Start Time (ACUTE ONLY): 1140 SLP Stop Time (ACUTE ONLY): 1150 SLP Time Calculation (min) (ACUTE ONLY): 10 min  Past Medical History:  Past Medical History:  Diagnosis Date   Allergy    Anemia    vitamin d3 deficiency   Anxiety    Asthma    Calciphylaxis    Chronic kidney disease    End Stage Renal Disease   Diabetes mellitus without complication (HCC)    GERD (gastroesophageal reflux disease)    nothing over last few years   Heart murmur    High serum parathyroid hormone (PTH)    checked through Dialysis   History of kidney stones 2000   Hypertension    Neuromuscular disorder (HCC)    neuropathy in feet   Osteoporosis    PONV (postoperative nausea and vomiting)    severe nausea requiring many doses of post op antiemetics   Past Surgical History:  Past Surgical History:  Procedure Laterality Date   ABDOMINAL HYSTERECTOMY  2007   APPLICATION OF WOUND VAC Left 11/15/2021   Procedure: APPLICATION OF WOUND VAC;  Surgeon: Krasinski, Kevin, MD;  Location: [facility];  Service: Orthopedics;  Laterality: Left;  Prevena 13cm    APPLICATION OF WOUND VAC Right 12/14/2021   Procedure: APPLICATION OF WOUND VAC;  Surgeon: Tobie Priest, MD;  Location: [facility];  Service: Orthopedics;  Laterality: Right;  [id number]   AV FISTULA INSERTION W/ RF MAGNETIC GUIDANCE N/A 12/08/2017   Procedure: AV FISTULA INSERTION W/RF MAGNETIC GUIDANCE;  Surgeon: Jama Cordella MATSU, MD;  Location: [facility];  Service: Cardiovascular;  Laterality: N/A;   DIALYSIS/PERMA CATHETER INSERTION Right 10/03/2021   Procedure: DIALYSIS/PERMA CATHETER INSERTION;  Surgeon: Jama Cordella MATSU, MD;  Location: [facility];  Service: Cardiovascular;  Laterality: Right;   HIP ARTHROPLASTY Left 09/29/2021   Procedure: ARTHROPLASTY BIPOLAR HIP (HEMIARTHROPLASTY);   Surgeon: Leora Lynwood SAUNDERS, MD;  Location: [facility];  Service: Orthopedics;  Laterality: Left;   HIP ARTHROPLASTY Right 12/14/2021   Procedure: ARTHROPLASTY BIPOLAR HIP (HEMIARTHROPLASTY);  Surgeon: Tobie Priest, MD;  Location: [facility];  Service: Orthopedics;  Laterality: Right;   INCISION AND DRAINAGE HIP Left 11/15/2021   Procedure: IRRIGATION AND DEBRIDEMENT LEFT HIP WOUND;  Surgeon: Marchia Drivers, MD;  Location: [facility];  Service: Orthopedics;  Laterality: Left;   PACEMAKER LEADLESS INSERTION N/A 03/09/2024   Procedure: PACEMAKER LEADLESS INSERTION;  Surgeon: Nancey Eulas BRAVO, MD;  Location: [facility];  Service: Cardiovascular;  Laterality: N/A;   QUADRICEPS TENDON REPAIR Right 10/16/2021   Procedure: REPAIR QUADRICEP TENDON;  Surgeon: Doll Skates, MD;  Location: [facility];  Service: Orthopedics;  Laterality: Right;   ROTATOR CUFF REPAIR Right 2004   SHOULDER CLOSED REDUCTION Right 2004   UPPER EXTREMITY VENOGRAPHY Left 02/15/2018   Procedure: UPPER EXTREMITY VENOGRAPHY;  Surgeon: Jama Cordella MATSU, MD;  Location: [facility];  Service: Cardiovascular;  Laterality: Left;   HPI:  [name] is a 59 y.o. 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    Assessment / Plan / Recommendation  Clinical Impression  Pt presents qith adequate oropharyngeal abilities when consuming thin liquids via straw, applesauce and whole graham crackers. No overt s/s of aspiration observed with pt able to feed herself. Recommend regular diet with thin liquids, medicine whole with thin liquids and general aspiration  precautions. No further skilled ST services are indicated for dysphagia. SLP Visit Diagnosis: Dysphagia, unspecified (R13.10)    Aspiration Risk  No limitations    Diet Recommendation    Regular with thin liquids, medicine whole with thin liquids       Other Recommendations Oral Care Recommendations: Oral care BID     Swallow Evaluation Recommendations Recommendations: PO diet PO Diet Recommendation: Regular;Thin liquids (Level 0) Liquid Administration via: Cup;Straw Medication Administration: Whole meds with liquid Supervision: Patient able to self-feed Swallowing strategies  : Minimize environmental distractions;Slow rate;Small bites/sips Postural changes: Position pt fully upright for meals Oral care recommendations: Oral care BID (2x/day)      Functional Status Assessment Patient has had a recent decline in their functional status and/or demonstrates limited ability to make significant improvements in function in a reasonable and predictable amount of time                      Swallow Study   General Date of Onset: 09/17/24 HPI: [name] is a 59 y.o. 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 Type of Study: Bedside Swallow Evaluation Previous Swallow Assessment: none in chart Diet Prior to this Study: NPO Temperature Spikes Noted: No Respiratory Status: Room air History of Recent Intubation: No Behavior/Cognition: Alert;Cooperative;Pleasant mood;Doesn't follow directions (doesn't follow directions d/t receptive language deficits) Oral Cavity Assessment: Within Functional Limits Oral Care  Completed by SLP: No Oral Cavity - Dentition: Adequate natural dentition Vision: Functional for self-feeding Self-Feeding Abilities: Able to feed self Patient Positioning:  (siding edge of bed) Baseline Vocal Quality: Normal Volitional Cough: Strong Volitional Swallow: Able to elicit    Oral/Motor/Sensory Function Overall Oral Motor/Sensory Function: Within functional limits   Ice Chips Ice chips: Not tested   Thin Liquid Thin Liquid: Within functional limits Presentation: Self Fed;Straw    Nectar Thick Nectar Thick Liquid: Not tested   Honey Thick Honey Thick Liquid: Not tested   Puree Puree: Within functional limits Presentation: Self Fed;Spoon   Solid     Solid: Within functional limits Presentation: Self Fed     Ireoluwa Gorsline B. Rubbie, M.S., CCC-SLP, Tree Surgeon Certified Brain Injury Specialist [facility]  [facility] Rehabilitation Services Office [phone] Ascom [phone] Fax [phone]

## 2024-09-18 NOTE — Progress Notes (Signed)
 Pt receives outpt HD at [facility] on MWF. Navigator following to assist with any HD needs.  Suzen Satchel Dialysis Navigator [phone]

## 2024-09-19 ENCOUNTER — Inpatient Hospital Stay: Admit: 2024-09-19 | Discharge: 2024-09-19 | Disposition: A | Attending: Internal Medicine

## 2024-09-19 ENCOUNTER — Inpatient Hospital Stay

## 2024-09-19 DIAGNOSIS — I6340 Cerebral infarction due to embolism of unspecified cerebral artery: Secondary | ICD-10-CM | POA: Diagnosis not present

## 2024-09-19 DIAGNOSIS — I120 Hypertensive chronic kidney disease with stage 5 chronic kidney disease or end stage renal disease: Secondary | ICD-10-CM | POA: Diagnosis not present

## 2024-09-19 DIAGNOSIS — R29707 NIHSS score 7: Secondary | ICD-10-CM | POA: Diagnosis not present

## 2024-09-19 DIAGNOSIS — E1122 Type 2 diabetes mellitus with diabetic chronic kidney disease: Secondary | ICD-10-CM | POA: Diagnosis not present

## 2024-09-19 DIAGNOSIS — Z992 Dependence on renal dialysis: Secondary | ICD-10-CM | POA: Diagnosis not present

## 2024-09-19 DIAGNOSIS — N186 End stage renal disease: Secondary | ICD-10-CM | POA: Diagnosis not present

## 2024-09-19 DIAGNOSIS — I639 Cerebral infarction, unspecified: Secondary | ICD-10-CM | POA: Diagnosis not present

## 2024-09-19 LAB — ECHOCARDIOGRAM COMPLETE
AR max vel: 0.78 cm2
AV Area VTI: 0.84 cm2
AV Area mean vel: 0.71 cm2
AV Mean grad: 13.7 mmHg
AV Peak grad: 22.8 mmHg
Ao pk vel: 2.39 m/s
Area-P 1/2: 3.76 cm2
Height: 67 in
MV M vel: 4.66 m/s
MV Peak grad: 86.9 mmHg
MV VTI: 0.8 cm2
Radius: 1 cm
S' Lateral: 4.6 cm
Single Plane A4C EF: 28.4 %
Weight: 3012.37 [oz_av]

## 2024-09-19 LAB — GLUCOSE, CAPILLARY
Glucose-Capillary: 170 mg/dL — ABNORMAL HIGH (ref 70–99)
Glucose-Capillary: 166 mg/dL — ABNORMAL HIGH (ref 70–99)
Glucose-Capillary: 168 mg/dL — ABNORMAL HIGH (ref 70–99)
Glucose-Capillary: 176 mg/dL — ABNORMAL HIGH (ref 70–99)
Glucose-Capillary: 134 mg/dL — ABNORMAL HIGH (ref 70–99)

## 2024-09-19 LAB — BASIC METABOLIC PANEL WITH GFR
Anion gap: 23 — ABNORMAL HIGH (ref 5–15)
BUN: 27 mg/dL — ABNORMAL HIGH (ref 6–20)
CO2: 22 mmol/L (ref 22–32)
Calcium: 9.7 mg/dL (ref 8.9–10.3)
Chloride: 91 mmol/L — ABNORMAL LOW (ref 98–111)
Creatinine, Ser: 4.05 mg/dL — ABNORMAL HIGH (ref 0.44–1.00)
GFR, Estimated: 12 mL/min — ABNORMAL LOW
Glucose, Bld: 158 mg/dL — ABNORMAL HIGH (ref 70–99)
Potassium: 4.7 mmol/L (ref 3.5–5.1)
Sodium: 135 mmol/L (ref 135–145)

## 2024-09-19 LAB — CBC
HCT: 31.5 % — ABNORMAL LOW (ref 36.0–46.0)
Hemoglobin: 9.3 g/dL — ABNORMAL LOW (ref 12.0–15.0)
MCH: 30.6 pg (ref 26.0–34.0)
MCHC: 29.5 g/dL — ABNORMAL LOW (ref 30.0–36.0)
MCV: 103.6 fL — ABNORMAL HIGH (ref 80.0–100.0)
Platelets: 249 K/uL (ref 150–400)
RBC: 3.04 MIL/uL — ABNORMAL LOW (ref 3.87–5.11)
RDW: 17.4 % — ABNORMAL HIGH (ref 11.5–15.5)
WBC: 7.1 K/uL (ref 4.0–10.5)
nRBC: 0 % (ref 0.0–0.2)

## 2024-09-19 MED ORDER — MIDODRINE HCL 5 MG PO TABS
10.0000 mg | ORAL_TABLET | Freq: Three times a day (TID) | ORAL | Status: DC
  Administered 2024-09-19 – 2024-09-20 (×4): 10 mg via ORAL
  Filled 2024-09-19 (×4): qty 2

## 2024-09-19 NOTE — Progress Notes (Signed)
 Subjective: Patient able to get more sentences out fluently today.  No new neurological complaints  Objective: Current vital signs: BP 111/67 (BP Location: Right Arm)   Pulse 87   Temp 98.6 F (37 C) (Oral)   Resp 18   Ht 5' 7 (1.702 m)   Wt 83.3 kg   SpO2 100%   BMI 28.76 kg/m  Vital signs in last 24 hours: Temp:  [97.7 F (36.5 C)-98.6 F (37 C)] 98.6 F (37 C) (01/20 1200) Pulse Rate:  [71-87] 87 (01/20 1200) Resp:  [13-21] 18 (01/20 0857) BP: (85-118)/(59-79) 111/67 (01/20 1200) SpO2:  [91 %-100 %] 100 % (01/20 1200) Weight:  [83.3 kg] 83.3 kg (01/19 1748)  Intake/Output from previous day: No intake/output data recorded. Intake/Output this shift: Total I/O In: 240 [P.O.:240] Out: -  Nutritional status:  Diet Order             Diet Carb Modified Room service appropriate? Yes  Diet effective now                   Neurologic Exam: Mental Status: Alert.  Expressive aphasia.  Needs reinforcement to follow complex commands.   Cranial Nerves: II: Visual fields grossly normal, pupils equal, round, reactive to light and accommodation III,IV, VI: ptosis not present, extra-ocular motions intact bilaterally V,VII: smile symmetric, facial light touch sensation normal bilaterally VIII: hearing normal bilaterally XI: bilateral shoulder shrug XII: midline tongue extension Motor: 5/5 in the BUEs.  Lower extremity testing limited by pain.   Sensory: Pinprick and light touch intact throughout, bilaterally Deep Tendon Reflexes: 2+ in the upper extremities and absent in the lower extremities.     Lab Results: Basic Metabolic Panel: Recent Labs  Lab 09/17/24 1800 09/18/24 0558 09/19/24 0502  NA 139 138 135  K 5.3* 5.3* 4.7  CL 90* 91* 91*  CO2 22 19* 22  GLUCOSE 200* 153* 158*  BUN 44* 47* 27*  CREATININE 5.20* 5.58* 4.05*  CALCIUM  10.4* 10.2 9.7  MG  --  2.2  --   PHOS  --  4.5  --     Liver Function Tests: Recent Labs  Lab 09/17/24 1800  09/18/24 0558  AST <10* <10*  ALT <5 <5  ALKPHOS 179* 158*  BILITOT 0.5 0.6  PROT 6.7 6.1*  ALBUMIN  3.9 3.6   No results for input(s): LIPASE, AMYLASE in the last 168 hours. No results for input(s): AMMONIA in the last 168 hours.  CBC: Recent Labs  Lab 09/17/24 1800 09/18/24 0558 09/19/24 0502  WBC 7.4 7.0 7.1  NEUTROABS 6.2  --   --   HGB 9.9* 9.3* 9.3*  HCT 32.5* 31.3* 31.5*  MCV 101.2* 101.3* 103.6*  PLT 275 252 249    Cardiac Enzymes: No results for input(s): CKTOTAL, CKMB, CKMBINDEX, TROPONINI in the last 168 hours.  Lipid Panel: Recent Labs  Lab 09/18/24 0558  CHOL 97  TRIG 102  HDL 44  CHOLHDL 2.2  VLDL 20  LDLCALC 32    CBG: Recent Labs  Lab 09/18/24 2055 09/18/24 2358 09/19/24 0459 09/19/24 0900 09/19/24 1203  GLUCAP 193* 123* 170* 166* 168*    Microbiology: Results for orders placed or performed during the hospital encounter of 08/30/24  Blood Culture (routine x 2)     Status: None   Collection Time: 08/30/24  5:21 PM   Specimen: BLOOD  Result Value Ref Range Status   Specimen Description BLOOD BLOOD RIGHT HAND  Final   Special Requests  Final    BOTTLES DRAWN AEROBIC AND ANAEROBIC Blood Culture results may not be optimal due to an inadequate volume of blood received in culture bottles   Culture   Final    NO GROWTH 5 DAYS Performed at [facility], [address] [place], [place] [address]    Report Status 09/04/2024 FINAL  Final  Blood Culture (routine x 2)     Status: None   Collection Time: 08/30/24  5:25 PM   Specimen: BLOOD  Result Value Ref Range Status   Specimen Description BLOOD RIGHT ANTECUBITAL  Final   Special Requests   Final    BOTTLES DRAWN AEROBIC AND ANAEROBIC Blood Culture adequate volume   Culture   Final    NO GROWTH 5 DAYS Performed at [facility], [address] [place], [place] [address]    Report Status 09/04/2024 FINAL  Final  Aerobic Culture w Gram Stain  (superficial specimen)     Status: None   Collection Time: 08/31/24  1:14 AM   Specimen: Leg; Wound  Result Value Ref Range Status   Specimen Description   Final    LEG Performed at [facility], [address], [place], [place] [address]    Special Requests   Final    NONE Performed at [facility], [address] [place], [place] [address]    Gram Stain   Final    NO WBC SEEN FEW GRAM NEGATIVE RODS RARE GRAM POSITIVE RODS RARE GRAM POSITIVE COCCI    Culture   Final    ABUNDANT PSEUDOMONAS ALCALIGENES FEW ENTEROCOCCUS FAECALIS ABUNDANT STENOTROPHOMONAS MALTOPHILIA Sent to [facility] for further susceptibility testing. SEE SEPARATE REPORT Performed at [facility], [address], [place], [place] [address]    Report Status 09/10/2024 FINAL  Final   Organism ID, Bacteria ENTEROCOCCUS FAECALIS  Final   Organism ID, Bacteria PSEUDOMONAS ALCALIGENES  Final   Organism ID, Bacteria PSEUDOMONAS ALCALIGENES  Final      Susceptibility   Enterococcus faecalis - MIC*    AMPICILLIN <=2 SENSITIVE Sensitive     VANCOMYCIN  1 SENSITIVE Sensitive     GENTAMICIN SYNERGY SENSITIVE Sensitive     * FEW ENTEROCOCCUS FAECALIS   Pseudomonas alcaligenes - MIC*    MEROPENEM 8 INTERMEDIATE Intermediate     CIPROFLOXACIN 0.5 SENSITIVE Sensitive     PIP/TAZO Value in next row Intermediate      32 INTERMEDIATEThis is a modified FDA-approved test that has been validated and its performance characteristics determined by the reporting laboratory.  This laboratory is certified under the Clinical Laboratory Improvement Amendments CLIA as qualified to perform high complexity clinical laboratory testing.    CEFEPIME  Value in next row Sensitive      32 INTERMEDIATEThis is a modified FDA-approved test that has been validated and its performance characteristics determined by the reporting laboratory.  This laboratory is certified under the Clinical Laboratory Improvement Amendments CLIA as  qualified to perform high complexity clinical laboratory testing.   Pseudomonas alcaligenes - MIC*    CEFOTAXIME (Non-meningitis) Value in next row Intermediate      32 INTERMEDIATEThis is a modified FDA-approved test that has been validated and its performance characteristics determined by the reporting laboratory.  This laboratory is certified under the Clinical Laboratory Improvement Amendments CLIA as qualified to perform high complexity clinical laboratory testing.    * ABUNDANT PSEUDOMONAS ALCALIGENES    ABUNDANT PSEUDOMONAS ALCALIGENES  Susceptibility, Aer + Anaerob     Status: Abnormal  Collection Time: 08/31/24  1:14 AM  Result Value Ref Range Status   Suscept, Aer + Anaerob Final report (A)  Corrected    Comment: (NOTE) Performed At: [facility] [address] [place], [place] [id number] Jennette Shorter MD Ey:[phone] CORRECTED ON 01/11 AT 0736: PREVIOUSLY REPORTED AS Preliminary report    Source PENDING  Incomplete  Susceptibility Result     Status: Abnormal   Collection Time: 08/31/24  1:14 AM  Result Value Ref Range Status   Suscept Result 1 Comment (A)  Final    Comment: (NOTE) Stenotrophomonas maltophilia Identification performed by account, not confirmed by this laboratory. Levofloxacin and Trimethoprim-Sulfamethoxazole should not be used alone for antimicrobial therapy (CLSI).    Antimicrobial Suscept Comment  Corrected    Comment: (NOTE)      ** S = Susceptible; I = Intermediate; R = Resistant **                   P = Positive; N = Negative            MICS are expressed in micrograms per mL   Antibiotic                 RSLT#1    RSLT#2    RSLT#3    RSLT#4 Levofloxacin                   S Minocycline                    S Trimethoprim/Sulfa             S Performed At: [facility] [employer] [address] [place], [place] [id number] Jennette Shorter MD Ey:[phone]     Coagulation Studies: Recent Labs    09/17/24 1800  LABPROT 16.6*  INR 1.3*     Imaging: ECHOCARDIOGRAM COMPLETE Result Date: 09/19/2024    ECHOCARDIOGRAM REPORT   Patient Name:   [name] Date of Exam: 09/19/2024 Medical Rec #:  [id number]    Height:       67.0 in Accession #:    [id number]   Weight:       183.6 lb Date of Birth:  October 02, 1965    BSA:          1.950 m Patient Age:    58 years     BP:           90/59 mmHg Patient Gender: F            HR:           85 bpm. Exam Location:  [facility] Procedure: 2D Echo, Cardiac Doppler, Color Doppler and Saline Contrast Bubble            Study (Both Spectral and Color Flow Doppler were utilized during            procedure). Indications:     Stroke I63.9  History:         Patient has prior history of Echocardiogram examinations, most                  recent 05/03/2024. Signs/Symptoms:Murmur; Risk Factors:Diabetes                  and Hypertension.  Sonographer:     Christopher Furnace Referring Phys:  [id number] OLADAPO ADEFESO Diagnosing Phys: Cara JONETTA Lovelace MD  Sonographer Comments: Suboptimal apical window. IMPRESSIONS  1. Left ventricular ejection fraction, by estimation, is 35 to 40%. The left ventricle  has moderately decreased function. The left ventricle demonstrates global hypokinesis. The left ventricular internal cavity size was moderately to severely dilated. Left ventricular diastolic parameters are consistent with Grade III diastolic dysfunction (restrictive). There is the interventricular septum is flattened in diastole ('D' shaped left ventricle), consistent with right ventricular volume overload.  2. Right ventricular systolic function is mildly reduced. The right ventricular size is mildly enlarged.  3. Left atrial size was moderately dilated.  4. Right atrial size was mildly dilated.  5. The mitral valve is abnormal. Moderate to severe mitral valve regurgitation. Moderate mitral annular calcification.  6. Tricuspid valve regurgitation is severe.  7. The aortic valve is calcified. There is moderate calcification of the aortic valve. There  is moderate thickening of the aortic valve. Aortic valve regurgitation is mild. Moderate to severe aortic valve stenosis.  8. Agitated saline contrast bubble study was positive with shunting observed within 3-6 cardiac cycles suggestive of interatrial shunt. FINDINGS  Left Ventricle: Left ventricular ejection fraction, by estimation, is 35 to 40%. The left ventricle has moderately decreased function. The left ventricle demonstrates global hypokinesis. Strain was performed and the global longitudinal strain is indeterminate. The left ventricular internal cavity size was moderately to severely dilated. There is borderline asymmetric left ventricular hypertrophy. The interventricular septum is flattened in diastole ('D' shaped left ventricle), consistent with right ventricular volume overload. Left ventricular diastolic parameters are consistent with Grade III diastolic dysfunction (restrictive). Right Ventricle: The right ventricular size is mildly enlarged. No increase in right ventricular wall thickness. Right ventricular systolic function is mildly reduced. Left Atrium: Left atrial size was moderately dilated. Right Atrium: Right atrial size was mildly dilated. Pericardium: There is no evidence of pericardial effusion. Mitral Valve: The mitral valve is abnormal. There is moderate thickening of the mitral valve leaflet(s). There is moderate calcification of the mitral valve leaflet(s). Moderate mitral annular calcification. Moderate to severe mitral valve regurgitation.  MV peak gradient, 12.7 mmHg. The mean mitral valve gradient is 6.0 mmHg. Tricuspid Valve: The tricuspid valve is grossly normal. Tricuspid valve regurgitation is severe. Aortic Valve: The aortic valve is calcified. There is moderate calcification of the aortic valve. There is moderate thickening of the aortic valve. There is moderate aortic valve annular calcification. Aortic valve regurgitation is mild. Moderate to severe aortic stenosis is  present. Aortic valve mean gradient measures 13.7 mmHg. Aortic valve peak gradient measures 22.8 mmHg. Aortic valve area, by VTI measures 0.84 cm. Pulmonic Valve: The pulmonic valve was not well visualized. Pulmonic valve regurgitation is not visualized. Aorta: The aortic root was not well visualized. IAS/Shunts: The interatrial septum was not well visualized. Agitated saline contrast was given intravenously to evaluate for intracardiac shunting. Agitated saline contrast bubble study was positive with shunting observed within 3-6 cardiac cycles suggestive of interatrial shunt. Additional Comments: 3D was performed not requiring image post processing on an independent workstation and was indeterminate.  LEFT VENTRICLE PLAX 2D LVIDd:         5.80 cm      Diastology LVIDs:         4.60 cm      LV e' medial:    5.11 cm/s LV PW:         0.80 cm      LV E/e' medial:  26.8 LV IVS:        1.10 cm      LV e' lateral:   10.60 cm/s LVOT diam:     2.00 cm  LV E/e' lateral: 12.9 LV SV:         31 LV SV Index:   16 LVOT Area:     3.14 cm LV IVRT:       74 msec  LV Volumes (MOD) LV vol d, MOD A4C: 112.0 ml LV vol s, MOD A4C: 80.2 ml LV SV MOD A4C:     112.0 ml RIGHT VENTRICLE RV Basal diam:  3.60 cm RV Mid diam:    3.10 cm RV S prime:     5.33 cm/s LEFT ATRIUM            Index        RIGHT ATRIUM           Index LA diam:      4.50 cm  2.31 cm/m   RA Area:     15.80 cm LA Vol (A4C): 115.0 ml 58.97 ml/m  RA Volume:   35.40 ml  18.15 ml/m  AORTIC VALVE AV Area (Vmax):    0.78 cm AV Area (Vmean):   0.71 cm AV Area (VTI):     0.84 cm AV Vmax:           239.00 cm/s AV Vmean:          170.667 cm/s AV VTI:            0.372 m AV Peak Grad:      22.8 mmHg AV Mean Grad:      13.7 mmHg LVOT Vmax:         59.10 cm/s LVOT Vmean:        38.700 cm/s LVOT VTI:          0.099 m LVOT/AV VTI ratio: 0.27  AORTA Ao Root diam: 2.90 cm MITRAL VALVE                  TRICUSPID VALVE MV Area (PHT): 3.76 cm       TR Peak grad:   35.3 mmHg MV  Area VTI:   0.80 cm       TR Vmax:        297.00 cm/s MV Peak grad:  12.7 mmHg MV Mean grad:  6.0 mmHg       SHUNTS MV Vmax:       1.78 m/s       Systemic VTI:  0.10 m MV Vmean:      109.0 cm/s     Systemic Diam: 2.00 cm MV Decel Time: 202 msec MR Peak grad:    86.9 mmHg MR Mean grad:    48.0 mmHg MR Vmax:         466.00 cm/s MR Vmean:        321.0 cm/s MR PISA:         6.28 cm MR PISA Eff ROA: 44 mm MR PISA Radius:  1.00 cm MV E velocity: 137.00 cm/s MV A velocity: 45.60 cm/s MV E/A ratio:  3.00 Cara JONETTA Lovelace MD Electronically signed by Cara JONETTA Lovelace MD Signature Date/Time: 09/19/2024/1:48:52 PM    Final    US  Carotid Bilateral Result Date: 09/19/2024 EXAM: US  CAROTID DUPLEX 09/19/2024 07:52:19 AM TECHNIQUE: Real-time grayscale, color flow and spectral Doppler sonographic images were obtained of the extracranial carotid system using a linear transducer. COMPARISON: None available CLINICAL HISTORY: Acute stroke due to ischemia (HCC), hypertension, stroke/TIA, hyperlipidemia, diabetes. FINDINGS: RIGHT: Common carotid artery: 52 cm/s Internal carotid artery: 60 cm/s External carotid artery: 48 cm/s Right ICA/CCA ratio: 1.2 Plaque: Calcified plaque  in the carotid bulb resulting in only mild stenosis. Scattered eccentric nonocclusive partially calcified plaque in the right common carotid artery. No focal color aliasing. Waveforms are normal. Vertebral artery: Antegrade LEFT: Common carotid artery: 53 cm/s Internal carotid artery: 75 cm/s External carotid artery: 59 cm/s LEFT ICA/CCA ratio: 1.4 Plaque: Eccentric partially calcified plaque in the bulb resulting in mild stenosis. Scattered nonocclusive plaque in the common carotid artery. Normal color Doppler signal and waveforms. Vertebral artery: Antegrade IMPRESSION: 1. No hemodynamically significant stenosis (greater than 50%) within the extracranial internal carotid arteries. 2. Mild stenosis in the right carotid bulb from calcified plaque and in the left  carotid bulb from eccentric partially calcified plaque. Electronically signed by: Katheleen Faes MD 09/19/2024 08:16 AM EST RP Workstation: [id number]   MR BRAIN WO CONTRAST Result Date: 09/18/2024 EXAM: MRI BRAIN WITHOUT CONTRAST 09/18/2024 01:10:00 AM TECHNIQUE: Multiplanar multisequence MRI of the head/brain was performed without the administration of intravenous contrast. COMPARISON: None available. CLINICAL HISTORY: Neuro deficit, acute, stroke suspected. FINDINGS: BRAIN AND VENTRICLES: There is a large area of acute ischemia affecting the posterior left MCA territory. Additionally, there are multiple punctate foci of acute ischemia within the centrum semiovale bilaterally. 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 No intracranial hemorrhage. No mass. No midline shift. No hydrocephalus. The sella is unremarkable. Normal flow voids. ORBITS: No significant abnormality. SINUSES AND MASTOIDS: No significant abnormality. BONES AND SOFT TISSUES: Normal marrow signal. No soft tissue abnormality. IMPRESSION: 1. Large area of acute ischemia affecting the posterior left MCA territory. 2. Multiple punctate foci of acute ischemia within the centrum semiovale bilaterally. 3. 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 Electronically signed by: Franky Stanford MD 09/18/2024 02:02 AM EST RP Workstation: [id number]   CT HEAD WO CONTRAST Result Date: 09/17/2024 EXAM: CT HEAD WITHOUT CONTRAST 09/17/2024 06:21:12 PM TECHNIQUE: CT of the head was performed without the administration of intravenous contrast. Automated exposure control, iterative reconstruction, and/or weight based adjustment of the mA/kV was utilized to reduce the radiation dose to as low as reasonably achievable. COMPARISON: None available. CLINICAL HISTORY: Neuro deficit, acute, stroke suspected. FINDINGS: BRAIN AND VENTRICLES: Low density in the posterior left temporal and parietal lobes compatible with acute infarction. No  hemorrhage. No hydrocephalus. No extra-axial collection. No mass effect or midline shift. ORBITS: No acute abnormality. SINUSES: No acute abnormality. SOFT TISSUES AND SKULL: No acute soft tissue abnormality. No skull fracture. IMPRESSION: 1. Acute infarction in the posterior left temporal and parietal lobes. This could be further evaluated with MRI if felt clinically indicated. Electronically signed by: Franky Crease MD 09/17/2024 06:30 PM EST RP Workstation: [id number]   DG Chest 2 View Result Date: 09/17/2024 EXAM: 2 VIEW(S) XRAY OF THE CHEST 09/17/2024 06:07:00 PM COMPARISON: 08/31/2024 CLINICAL HISTORY: AMS FINDINGS: LINES, TUBES AND DEVICES: Right dialysis catheter remains in place, unchanged. LUNGS AND PLEURA: Low lung volumes. No confluent opacities, effusions, or edema. No pneumothorax. HEART AND MEDIASTINUM: Mild cardiomegaly. Leadless pacer remains in place, unchanged. BONES AND SOFT TISSUES: No acute osseous abnormality. IMPRESSION: 1. No acute cardiopulmonary process. Electronically signed by: Kevin Dover MD 09/17/2024 06:21 PM EST RP Workstation: [id number]    Medications: Scheduled:  aspirin  EC  81 mg Oral Daily   atorvastatin   20 mg Oral QPM   Chlorhexidine  Gluconate Cloth  6 each Topical Q0600   gabapentin   100 mg Oral QHS   heparin  injection (subcutaneous)  5,000 Units Subcutaneous Q8H   insulin  aspart  0-6 Units Subcutaneous Q4H  midodrine   10 mg Oral TID WC    Assessment/Plan: 59 y.o. 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 Found to be aphasic.  No other neurological findings on examination.  MRI of the brain personally reviewed and reveals a large posterior left MCA territory infarct and multiple small, punctate acute infarcts in the centrum semiovale bilaterally.  Etiology likely cardioembolic.  Echocardiogram shows an EF of 35-40% with a  moderately dilated left atrium and positive bubble study.    MRA unable to be performed.  Carotid dopplers show no hemodynamically significant stenosis.     A1c 6.7, LDL 32.  BP controlled   Stroke Risk Factors - diabetes mellitus and hypertension   Plan: 1. Echocardiogram results make cardioembolic etiology likely.  LDL 32 therefore would discontinue statin.  2. Cardiology to be involved.       LOS: 1 day   Sonny Hock, MD Neurology  09/19/2024  2:08 PM

## 2024-09-19 NOTE — Progress Notes (Signed)
 " [facility]  ROUNDING NOTE   Subjective:   [name] s a 59 year old female with past medical conditions including anemia, GERD, hypertension, stroke, diabetes, and end-stage renal disease on hemodialysis. Patient presents to the emergency department for evaluation of altered mental status. She has been admitted for Acute ischemic stroke [facility]) [I63.9] Expressive aphasia [R47.01] Cerebrovascular accident (CVA), unspecified mechanism (HCC) [I63.9]  Patient is known to our practice and receives outpatient dialysis treatments at [facility] MWF schedule, supervised by Dr. Marcelino.   Patient seen resting quietly Easily aroused Alert, oriented to self Room air   Objective:  Vital signs in last 24 hours:  Temp:  [97.7 F (36.5 C)-98.6 F (37 C)] 97.7 F (36.5 C) (01/20 0857) Pulse Rate:  [71-88] 85 (01/20 0857) Resp:  [13-21] 18 (01/20 0857) BP: (85-118)/(59-79) 97/63 (01/20 0857) SpO2:  [91 %-100 %] 98 % (01/20 0857) Weight:  [83.3 kg-83.8 kg] 83.3 kg (01/19 1748)  Weight change: 5.781 kg Filed Weights   09/18/24 0421 09/18/24 1322 09/18/24 1748  Weight: 85.4 kg 83.8 kg 83.3 kg    Intake/Output: No intake/output data recorded.   Intake/Output this shift:  No intake/output data recorded.  Physical Exam: General: NAD  Head: Normocephalic, atraumatic. Moist oral mucosal membranes  Eyes: Anicteric  Lungs:  Clear to auscultation  Heart: Regular rate and rhythm, paced  Abdomen:  Soft, nontender  Extremities:  No peripheral edema.  Neurologic: Awake, oriented to self  Skin: BLE ace bandages  Access: Rt permcath    Basic Metabolic Panel: Recent Labs  Lab 09/17/24 1800 09/18/24 0558 09/19/24 0502  NA 139 138 135  K 5.3* 5.3* 4.7  CL 90* 91* 91*  CO2 22 19* 22  GLUCOSE 200* 153* 158*  BUN 44* 47* 27*  CREATININE 5.20* 5.58* 4.05*  CALCIUM  10.4* 10.2 9.7  MG  --  2.2  --   PHOS  --  4.5  --     Liver Function Tests: Recent Labs  Lab  09/17/24 1800 09/18/24 0558  AST <10* <10*  ALT <5 <5  ALKPHOS 179* 158*  BILITOT 0.5 0.6  PROT 6.7 6.1*  ALBUMIN  3.9 3.6   No results for input(s): LIPASE, AMYLASE in the last 168 hours. No results for input(s): AMMONIA in the last 168 hours.  CBC: Recent Labs  Lab 09/17/24 1800 09/18/24 0558 09/19/24 0502  WBC 7.4 7.0 7.1  NEUTROABS 6.2  --   --   HGB 9.9* 9.3* 9.3*  HCT 32.5* 31.3* 31.5*  MCV 101.2* 101.3* 103.6*  PLT 275 252 249    Cardiac Enzymes: No results for input(s): CKTOTAL, CKMB, CKMBINDEX, TROPONINI in the last 168 hours.  BNP: Invalid input(s): POCBNP  CBG: Recent Labs  Lab 09/18/24 1810 09/18/24 2055 09/18/24 2358 09/19/24 0459 09/19/24 0900  GLUCAP 110* 193* 123* 170* 166*    Microbiology: Results for orders placed or performed during the hospital encounter of 08/30/24  Blood Culture (routine x 2)     Status: None   Collection Time: 08/30/24  5:21 PM   Specimen: BLOOD  Result Value Ref Range Status   Specimen Description BLOOD BLOOD RIGHT HAND  Final   Special Requests   Final    BOTTLES DRAWN AEROBIC AND ANAEROBIC Blood Culture results may not be optimal due to an inadequate volume of blood received in culture bottles   Culture   Final    NO GROWTH 5 DAYS Performed at [facility], [address] [place], [place]  [address]    Report Status 09/04/2024 FINAL  Final  Blood Culture (routine x 2)     Status: None   Collection Time: 08/30/24  5:25 PM   Specimen: BLOOD  Result Value Ref Range Status   Specimen Description BLOOD RIGHT ANTECUBITAL  Final   Special Requests   Final    BOTTLES DRAWN AEROBIC AND ANAEROBIC Blood Culture adequate volume   Culture   Final    NO GROWTH 5 DAYS Performed at [facility], [address], [place], [place] [address]    Report Status 09/04/2024 FINAL  Final  Aerobic Culture w Gram Stain (superficial specimen)     Status: None   Collection Time: 08/31/24  1:14 AM    Specimen: Leg; Wound  Result Value Ref Range Status   Specimen Description   Final    LEG Performed at [facility], [address], [place], [place] [address]    Special Requests   Final    NONE Performed at [facility], [address] [place], [place] [address]    Gram Stain   Final    NO WBC SEEN FEW GRAM NEGATIVE RODS RARE GRAM POSITIVE RODS RARE GRAM POSITIVE COCCI    Culture   Final    ABUNDANT PSEUDOMONAS ALCALIGENES FEW ENTEROCOCCUS FAECALIS ABUNDANT STENOTROPHOMONAS MALTOPHILIA Sent to [facility] for further susceptibility testing. SEE SEPARATE REPORT Performed at [facility], [address] [place], [place] [address]    Report Status 09/10/2024 FINAL  Final   Organism ID, Bacteria ENTEROCOCCUS FAECALIS  Final   Organism ID, Bacteria PSEUDOMONAS ALCALIGENES  Final   Organism ID, Bacteria PSEUDOMONAS ALCALIGENES  Final      Susceptibility   Enterococcus faecalis - MIC*    AMPICILLIN <=2 SENSITIVE Sensitive     VANCOMYCIN  1 SENSITIVE Sensitive     GENTAMICIN SYNERGY SENSITIVE Sensitive     * FEW ENTEROCOCCUS FAECALIS   Pseudomonas alcaligenes - MIC*    MEROPENEM 8 INTERMEDIATE Intermediate     CIPROFLOXACIN 0.5 SENSITIVE Sensitive     PIP/TAZO Value in next row Intermediate      32 INTERMEDIATEThis is a modified FDA-approved test that has been validated and its performance characteristics determined by the reporting laboratory.  This laboratory is certified under the Clinical Laboratory Improvement Amendments CLIA as qualified to perform high complexity clinical laboratory testing.    CEFEPIME  Value in next row Sensitive      32 INTERMEDIATEThis is a modified FDA-approved test that has been validated and its performance characteristics determined by the reporting laboratory.  This laboratory is certified under the Clinical Laboratory Improvement Amendments CLIA as qualified to perform high complexity clinical laboratory testing.   Pseudomonas  alcaligenes - MIC*    CEFOTAXIME (Non-meningitis) Value in next row Intermediate      32 INTERMEDIATEThis is a modified FDA-approved test that has been validated and its performance characteristics determined by the reporting laboratory.  This laboratory is certified under the Clinical Laboratory Improvement Amendments CLIA as qualified to perform high complexity clinical laboratory testing.    * ABUNDANT PSEUDOMONAS ALCALIGENES    ABUNDANT PSEUDOMONAS ALCALIGENES  Susceptibility, Aer + Anaerob     Status: Abnormal   Collection Time: 08/31/24  1:14 AM  Result Value Ref Range Status   Suscept, Aer + Anaerob Final report (A)  Corrected    Comment: (NOTE) Performed At: [facility] [address] [place], [place] [id number] Jennette Shorter MD Ey:[id number] CORRECTED ON 01/11 AT 0736: PREVIOUSLY  REPORTED AS Preliminary report    Source PENDING  Incomplete  Susceptibility Result     Status: Abnormal   Collection Time: 08/31/24  1:14 AM  Result Value Ref Range Status   Suscept Result 1 Comment (A)  Final    Comment: (NOTE) Stenotrophomonas maltophilia Identification performed by account, not confirmed by this laboratory. Levofloxacin and Trimethoprim-Sulfamethoxazole should not be used alone for antimicrobial therapy (CLSI).    Antimicrobial Suscept Comment  Corrected    Comment: (NOTE)      ** S = Susceptible; I = Intermediate; R = Resistant **                   P = Positive; N = Negative            MICS are expressed in micrograms per mL   Antibiotic                 RSLT#1    RSLT#2    RSLT#3    RSLT#4 Levofloxacin                   S Minocycline                    S Trimethoprim/Sulfa             S Performed At: [facility] [employer] [address] [place], [place] [id number] Jennette Shorter MD Ey:[id number]     Coagulation Studies: Recent Labs    09/17/24 1800  LABPROT 16.6*  INR 1.3*    Urinalysis: No results for input(s): COLORURINE, LABSPEC, PHURINE,  GLUCOSEU, HGBUR, BILIRUBINUR, KETONESUR, PROTEINUR, UROBILINOGEN, NITRITE, LEUKOCYTESUR in the last 72 hours.  Invalid input(s): APPERANCEUR    Imaging: US  Carotid Bilateral Result Date: 09/19/2024 EXAM: US  CAROTID DUPLEX 09/19/2024 07:52:19 AM TECHNIQUE: Real-time grayscale, color flow and spectral Doppler sonographic images were obtained of the extracranial carotid system using a linear transducer. COMPARISON: None available CLINICAL HISTORY: Acute stroke due to ischemia (HCC), hypertension, stroke/TIA, hyperlipidemia, diabetes. FINDINGS: RIGHT: Common carotid artery: 52 cm/s Internal carotid artery: 60 cm/s External carotid artery: 48 cm/s Right ICA/CCA ratio: 1.2 Plaque: Calcified plaque in the carotid bulb resulting in only mild stenosis. Scattered eccentric nonocclusive partially calcified plaque in the right common carotid artery. No focal color aliasing. Waveforms are normal. Vertebral artery: Antegrade LEFT: Common carotid artery: 53 cm/s Internal carotid artery: 75 cm/s External carotid artery: 59 cm/s LEFT ICA/CCA ratio: 1.4 Plaque: Eccentric partially calcified plaque in the bulb resulting in mild stenosis. Scattered nonocclusive plaque in the common carotid artery. Normal color Doppler signal and waveforms. Vertebral artery: Antegrade IMPRESSION: 1. No hemodynamically significant stenosis (greater than 50%) within the extracranial internal carotid arteries. 2. Mild stenosis in the right carotid bulb from calcified plaque and in the left carotid bulb from eccentric partially calcified plaque. Electronically signed by: Katheleen Faes MD 09/19/2024 08:16 AM EST RP Workstation: [id number]   MR BRAIN WO CONTRAST Result Date: 09/18/2024 EXAM: MRI BRAIN WITHOUT CONTRAST 09/18/2024 01:10:00 AM TECHNIQUE: Multiplanar multisequence MRI of the head/brain was performed without the administration of intravenous contrast. COMPARISON: None available. CLINICAL HISTORY: Neuro deficit, acute,  stroke suspected. FINDINGS: BRAIN AND VENTRICLES: There is a large area of acute ischemia affecting the posterior left MCA territory. Additionally, there are multiple punctate foci of acute ischemia within the centrum semiovale bilaterally. 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 No intracranial hemorrhage. No mass. No midline shift. No hydrocephalus. The sella is unremarkable. Normal flow  voids. ORBITS: No significant abnormality. SINUSES AND MASTOIDS: No significant abnormality. BONES AND SOFT TISSUES: Normal marrow signal. No soft tissue abnormality. IMPRESSION: 1. Large area of acute ischemia affecting the posterior left MCA territory. 2. Multiple punctate foci of acute ischemia within the centrum semiovale bilaterally. 3. 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 Electronically signed by: Franky Stanford MD 09/18/2024 02:02 AM EST RP Workstation: [id number]   CT HEAD WO CONTRAST Result Date: 09/17/2024 EXAM: CT HEAD WITHOUT CONTRAST 09/17/2024 06:21:12 PM TECHNIQUE: CT of the head was performed without the administration of intravenous contrast. Automated exposure control, iterative reconstruction, and/or weight based adjustment of the mA/kV was utilized to reduce the radiation dose to as low as reasonably achievable. COMPARISON: None available. CLINICAL HISTORY: Neuro deficit, acute, stroke suspected. FINDINGS: BRAIN AND VENTRICLES: Low density in the posterior left temporal and parietal lobes compatible with acute infarction. No hemorrhage. No hydrocephalus. No extra-axial collection. No mass effect or midline shift. ORBITS: No acute abnormality. SINUSES: No acute abnormality. SOFT TISSUES AND SKULL: No acute soft tissue abnormality. No skull fracture. IMPRESSION: 1. Acute infarction in the posterior left temporal and parietal lobes. This could be further evaluated with MRI if felt clinically indicated. Electronically signed by: Franky Crease MD 09/17/2024 06:30 PM EST RP  Workstation: [id number]   DG Chest 2 View Result Date: 09/17/2024 EXAM: 2 VIEW(S) XRAY OF THE CHEST 09/17/2024 06:07:00 PM COMPARISON: 08/31/2024 CLINICAL HISTORY: AMS FINDINGS: LINES, TUBES AND DEVICES: Right dialysis catheter remains in place, unchanged. LUNGS AND PLEURA: Low lung volumes. No confluent opacities, effusions, or edema. No pneumothorax. HEART AND MEDIASTINUM: Mild cardiomegaly. Leadless pacer remains in place, unchanged. BONES AND SOFT TISSUES: No acute osseous abnormality. IMPRESSION: 1. No acute cardiopulmonary process. Electronically signed by: Franky Crease MD 09/17/2024 06:21 PM EST RP Workstation: [id number]     Medications:    sodium thiosulfate  25 g in sodium chloride  0.9 % 200 mL Infusion for Calciphylaxis Stopped (09/18/24 1738)    aspirin  EC  81 mg Oral Daily   atorvastatin   20 mg Oral QPM   Chlorhexidine  Gluconate Cloth  6 each Topical Q0600   gabapentin   100 mg Oral QHS   heparin  injection (subcutaneous)  5,000 Units Subcutaneous Q8H   insulin  aspart  0-6 Units Subcutaneous Q4H   midodrine   10 mg Oral TID WC   acetaminophen  **OR** acetaminophen , HYDROmorphone  (DILAUDID ) injection, ondansetron  **OR** ondansetron  (ZOFRAN ) IV  Assessment/ Plan:  [name] is a 59 y.o.  female with past medical conditions including anemia, GERD, hypertension, stroke, diabetes, and end-stage renal disease on hemodialysis. Patient presents to the emergency department for altered mental status.SABRA   [facility] [place]/MWF/Rt permcath  End-stage renal disease on hemodialysis.  Received dialysis yesterday, UF due to hypotension. Next treatment scheduled for Wednesday  2.  Acute ischemic CVA, confirmed on CT and MRI brain.  Neurology consulted..  Statin initiation along with PT/OT, and neuro checks. ECHO pending  3. Anemia of chronic kidney disease Lab Results  Component Value Date   HGB 9.3 (L) 09/19/2024    Will continue to monitor hgb.  No signs of active bleeding.  Will hold ESA's due to recent stroke.  4. Secondary Hyperparathyroidism: with outpatient labs: None available currently  Lab Results  Component Value Date   CALCIUM  9.7 09/19/2024   CAION 1.00 (L) 03/09/2024   PHOS 4.5 09/18/2024    Will continue to monitor bone minerals    LOS: 1 [name] 1/20/202611:10 AM   "

## 2024-09-19 NOTE — Progress Notes (Signed)
*  PRELIMINARY RESULTS* Echocardiogram 2D Echocardiogram has been performed.  Floydene Harder 09/19/2024, 8:50 AM

## 2024-09-19 NOTE — Plan of Care (Signed)
" °  Problem: Education: Goal: Knowledge of General Education information will improve Description: Including pain rating scale, medication(s)/side effects and non-pharmacologic comfort measures Outcome: Progressing   Problem: Nutrition: Goal: Adequate nutrition will be maintained Outcome: Progressing   Problem: Coping: Goal: Level of anxiety will decrease Outcome: Progressing   Problem: Safety: Goal: Ability to remain free from injury will improve Outcome: Progressing   Problem: Education: Goal: Knowledge of secondary prevention will improve (MUST DOCUMENT ALL) Outcome: Progressing Goal: Knowledge of patient specific risk factors will improve (DELETE if not current risk factor) Outcome: Progressing   "

## 2024-09-19 NOTE — Progress Notes (Signed)
 Occupational Therapy Treatment Patient Details Name: [name] MRN: [id number] DOB: 1965/10/17 Today's Date: 09/19/2024   History of present illness 59 y/o female presented to ED on 09/17/24 for AMS and expressive aphasia. MRI showed large acute posterior L MCA infarct. PMH: HTN, type 2 diabetes, ESRD on HD MWF, calchiphylaxis, neuropathy   OT comments  Patient resting in bed upon OT arrival; declines any OOB activity due to fatigue and no pain (doesn't want to aggravate BLE pain). Discussed with patient that neurologist noted improved clarity of speech and discussed implication of HH vs SNF; patient wanting to dc home now. Asked patient how to call for help ICE at home and what address would she give 911 operator; patient only able to say numbers (nonsensical) and not address. OT provided written script but patient unable to verbalize address and continued to only say numbers. OT voiced concerns over patient going home with inability to communicate given written prompts; discussed obtaining life alert, patient agreeable. Spouse not present, OT called spouse to notify of concerns; he and patient agree SNF is most appropriate at this time. Spouse agreeable to obtain life alert. Patient ended session in bed with cardiology PA present.      If plan is discharge home, recommend the following:  A little help with walking and/or transfers;A little help with bathing/dressing/bathroom;Assistance with cooking/housework;Assist for transportation;Help with stairs or ramp for entrance   Equipment Recommendations  None recommended by OT    Recommendations for Other Services      Precautions / Restrictions Precautions Precautions: Fall Recall of Precautions/Restrictions: Intact Restrictions Weight Bearing Restrictions Per Provider Order: No       Mobility Bed Mobility                    Transfers                         Balance                                            ADL either performed or assessed with clinical judgement   ADL                                              Extremity/Trunk Assessment              Vision       Perception     Praxis     Communication Communication Communication: Impaired Factors Affecting Communication: Difficulty expressing self   Cognition Arousal: Alert Behavior During Therapy: WFL for tasks assessed/performed Cognition: No apparent impairments                               Following commands: Intact        Cueing   Cueing Techniques: Verbal cues  Exercises      Shoulder Instructions       General Comments education provided regarding discharge home vs SNF; discussed concerns regarding aphasia and being able to call for help and provide address.    Pertinent Vitals/ Pain       Pain Assessment Pain Assessment: No/denies pain  Home Living  Prior Functioning/Environment              Frequency  Min 2X/week        Progress Toward Goals  OT Goals(current goals can now be found in the care plan section)  Progress towards OT goals: Progressing toward goals  Acute Rehab OT Goals Patient Stated Goal: to talk better OT Goal Formulation: With patient Time For Goal Achievement: 10/02/24 Potential to Achieve Goals: Good ADL Goals Pt Will Perform Grooming: with min assist;sitting Pt Will Perform Lower Body Dressing: with contact guard assist;sit to/from stand Pt Will Transfer to Toilet: with contact guard assist;regular height toilet;grab bars Pt Will Perform Toileting - Clothing Manipulation and hygiene: with min assist;with contact guard assist;sit to/from stand  Plan      Co-evaluation                 AM-PAC OT 6 Clicks Daily Activity     Outcome Measure   Help from another person eating meals?: A Little Help from another person taking care of personal  grooming?: A Little Help from another person toileting, which includes using toliet, bedpan, or urinal?: A Little Help from another person bathing (including washing, rinsing, drying)?: A Little Help from another person to put on and taking off regular upper body clothing?: A Little Help from another person to put on and taking off regular lower body clothing?: A Lot 6 Click Score: 17    End of Session    OT Visit Diagnosis: Muscle weakness (generalized) (M62.81);Other (comment)   Activity Tolerance Patient tolerated treatment well   Patient Left in bed;with call bell/phone within reach;with nursing/sitter in room   Nurse Communication          Time: 8468-8460 OT Time Calculation (min): 8 min  Charges: OT General Charges $OT Visit: 1 Visit  Rogers Clause, OT/L MSOT, 09/19/2024

## 2024-09-19 NOTE — NC FL2 (Signed)
 " [place]  MEDICAID FL2 LEVEL OF CARE FORM     IDENTIFICATION  Patient Name: [name] Birthdate: 1966/06/03 Sex: female Admission Date (Current Location): 09/17/2024  [facility] and [place] Number:  Chiropodist and Address:  [facility], [address] [place], [place] [address]      Provider Number: [id number]  Attending Physician Name and Address:  Von Bellis, MD  Relative Name and Phone Number:       Current Level of Care: Hospital Recommended Level of Care: Skilled Nursing Facility Prior Approval Number:    Date Approved/Denied:   PASRR Number: [id number]  Discharge Plan: SNF    Current Diagnoses: Patient Active Problem List   Diagnosis Date Noted   Acute ischemic stroke (HCC) 09/17/2024   Complicated wound infection 08/30/2024   Pulmonary hypertension (HCC) 07/25/2024   Presence of heart assist device (HCC) 07/25/2024   Asthma 07/25/2024   Chronic pain syndrome 07/03/2024   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   PAD (peripheral artery disease) 05/01/2024   Polyarthritis 03/22/2024   Type 2 diabetes mellitus with end-stage renal disease (HCC) 03/20/2024   HFrEF (heart failure with reduced ejection fraction) (HCC) 03/20/2024   Mitral regurgitation 03/20/2024   Complete heart block (HCC) 03/02/2024   Osteoporosis with current pathological fracture 12/14/2021   History of hemiarthroplasty of left hip 11/19/2021   History of fracture of left hip 11/15/2021   Anemia of chronic disease 11/14/2021   Diabetic peripheral neuropathy (HCC)    Quadriceps tendon rupture 10/16/2021   ESRD on dialysis (HCC) 09/27/2018   Secondary hyperparathyroidism of renal origin 03/07/2018   Stage 5 chronic renal impairment associated with type 2 diabetes mellitus (HCC) 07/26/2017   Hyperphosphatemia 09/29/2016   Dyslipidemia 08/08/2015    Orientation RESPIRATION BLADDER Height & Weight     Self, Time, Situation, Place      Weight: 83.3 kg Height:  5'  7 (170.2 cm)  BEHAVIORAL SYMPTOMS/MOOD NEUROLOGICAL BOWEL NUTRITION STATUS        Diet (Carb Modified)  AMBULATORY STATUS COMMUNICATION OF NEEDS Skin   Limited Assist                           Personal Care Assistance Level of Assistance  Feeding, Bathing, Dressing Bathing Assistance: Limited assistance Feeding assistance: Independent Dressing Assistance: Limited assistance     Functional Limitations Info             SPECIAL CARE FACTORS FREQUENCY  PT (By licensed PT), OT (By licensed OT), Speech therapy     PT Frequency: 5 x week OT Frequency: 5 x week     Speech Therapy Frequency: 5 x week      Contractures      Additional Factors Info  Code Status, Allergies Code Status Info: FULL Allergies Info: Enalapril, Ivp Dye (Iodinated Contrast Media), Lisinopril, Prednison           Current Medications (09/19/2024):  This is the current hospital active medication list Current Facility-Administered Medications  Medication Dose Route Frequency Provider Last Rate Last Admin   acetaminophen  (TYLENOL ) tablet 650 mg  650 mg Oral Q6H PRN Adefeso, Oladapo, DO   650 mg at 09/18/24 2145   Or   acetaminophen  (TYLENOL ) suppository 650 mg  650 mg Rectal Q6H PRN Adefeso, Oladapo, DO       aspirin  EC tablet 81 mg  81 mg Oral Daily Von Bellis, MD   81 mg at 09/19/24 9076   atorvastatin  (  LIPITOR) tablet 20 mg  20 mg Oral QPM Von Bellis, MD   20 mg at 09/18/24 1829   Chlorhexidine  Gluconate Cloth 2 % PADS 6 each  6 each Topical Q0600 Druscilla Bald, NP   6 each at 09/19/24 0456   gabapentin  (NEURONTIN ) capsule 100 mg  100 mg Oral QHS Von Bellis, MD   100 mg at 09/18/24 2145   heparin  injection 5,000 Units  5,000 Units Subcutaneous Q8H Von Bellis, MD   5,000 Units at 09/19/24 0456   HYDROmorphone  (DILAUDID ) injection 0.5 mg  0.5 mg Intravenous Q4H PRN Von Bellis, MD   0.5 mg at 09/19/24 0209   insulin  aspart (novoLOG ) injection 0-6 Units  0-6 Units Subcutaneous Q4H  Adefeso, Oladapo, DO   1 Units at 09/19/24 1225   midodrine  (PROAMATINE ) tablet 10 mg  10 mg Oral TID WC Von Bellis, MD   10 mg at 09/19/24 1224   ondansetron  (ZOFRAN ) tablet 4 mg  4 mg Oral Q6H PRN Adefeso, Oladapo, DO       Or   ondansetron  (ZOFRAN ) injection 4 mg  4 mg Intravenous Q6H PRN Adefeso, Oladapo, DO       sodium thiosulfate  25 g in sodium chloride  0.9 % 200 mL Infusion for Calciphylaxis  25 g Intravenous Q M,W,F-HD Druscilla Bald, NP   Stopped at 09/18/24 1738     Discharge Medications: Please see discharge summary for a list of discharge medications.  Relevant Imaging Results:  Relevant Lab Results:   Additional Information SS# [id number]  Dalia GORMAN Fuse, RN     "

## 2024-09-19 NOTE — Progress Notes (Signed)
 [facility] Progress Note  Patient: [name]    FMW:[id number]  DOA: 09/17/2024     Date of Service: the patient was seen and examined on 09/19/2024  Chief Complaint  Patient presents with   Altered Mental Status   Brief hospital course: Luz Mares is a 59 y.o. 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  Patient was playing games on the computer last night when she suddenly started to slur her words with her speech not making sense.  Last known well was around 10:30 PM yesterday (1/17).  Patient thought that her symptoms were due to gabapentin  she recently started to take.  Patient's symptoms persisted today, so it was decided for her to go to the ED for further evaluation. Dialysis was on Friday (1/16), she was scheduled for another session on Saturday to take off more fluid, but due to not feeling well, she was unable to go for the session.  She denied fall, headache, fever, chills.  Patient is wheelchair-bound at baseline   ED course In the emergency department, she was hemodynamically stable.  Workup in the ED showed macrocytic anemia.  BMP was significant for potassium of 5.3, chloride 90, blood glucose 200, BUN 44, creatinine 5.20, calcium  10.4, ALP 179, anion gap 28 (within baseline range). CT head without contrast showed acute infarction in the posterior left temporal and parietal lobes Aspirin  300 mg was given rectally (due to patient failing bedside swallow eval).  IV hydration provided. Neurologist (Dr. Matthews) was consulted and will see patient in the morning per EDP [facility] was asked to admit patient    Assessment and Plan:  # Acute ischemic stroke MRI brain: 1. Large area of acute ischemia affecting the posterior left MCA territory. 2. Multiple punctate foci of  acute ischemia within the centrum semiovale bilaterally. 3. 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 Continue fall precautions and neuro checks Lipid panel LDL 32, very low <70 goal.  Remains at high risk for intracerebral hemorrhage due to low LDL.  Double statin.  HbA1c at 6.7, Continue PT/SLP/OT eval and treat MRA head and neck could not be done because patient's heart rate dropped to 30s to 40s after placing pacemaker on the MRI mode. Patient is allergic to IV contrast so we will do carotid Doppler.,  If needed then we will premedicate for CTA as per neurology 1/20 carotid duplex negative for any significant stenosis TTE LVEF 35 to 40%, global hypokinesis, LV moderate to severely dilated, grade 3 DD, RV volume overload, D-shaped ventricle.  Moderate to severe MR, severe TR, moderate to severe AS, positive PFO. Seen by neurology, recommended to continue aspirin  81 mg p.o. daily, d/c'd statin and no need of DAPT at this time. 1/20 Consulted cardiology due to abnormal TTE Follow venous duplex order placed due to positive PFO on TTE to rule out paradoxical embolism    # Hyperkalemia K+ 5.3, possibly due to ESRD, this will be corrected during dialysis.   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  This will be corrected urinalysis Magnesium  level will be checked 1/19 Repeat EKG Qtc 566, ventricular paced rhythm   Elevated troponin probably secondary to type II demand ischemia Troponin 114 > 106, troponin has flattened.  She denies chest pain.   Type 2 diabetes  mellitus with hyperglycemia Hemoglobin A1c on 07/25/2024 was 6.5 Continue ISS and hypoglycemic protocol   Essential hypertension (controlled) No antihypertensive medication noted on med rec 1/20 noticed low blood pressure, started on midodrine  with holding parameter   Mixed hyperlipidemia No antihyperlipidemic medication noted on patient's med rec.   Lipid profile: TC 97, LDL 32, TG 102 Started low-dose statin Lipitor 20 mg p.o. daily  1/20 Poor prognosis secondary to above comorbidities Patient is full code so palliative care consulted for goals of care discussion.   Body mass index is 28.76 kg/m.  Interventions:  Diet: Carb modified diet DVT Prophylaxis: Subcutaneous Heparin     Advance goals of care discussion: Full code  Family Communication: family was present at bedside, at the time of interview.  The pt provided permission to discuss medical plan with the family. Opportunity was given to ask question and all questions were answered satisfactorily.  1/19 d/w patient's husband at bedside.  Disposition:  Pt is from Home, admitted with Acute sroke, still very sick, which precludes a safe discharge. Discharge to SNF, when stable, may need 1-2 more days to improve  Subjective: No significant events overnight.  Patient is forgetful, AO x 1, confused.  Denied any other active issues.  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, Unna boots intact  Neurologic: without any new focal findings Gait not checked due to patient safety concerns  Vitals:   09/18/24 2357 09/19/24 0356 09/19/24 0857 09/19/24 1200   BP: (!) 85/60 (!) 90/59 97/63 111/67  Pulse: 84 85 85 87  Resp: 18  18   Temp: 98.6 F (37 C) 97.9 F (36.6 C) 97.7 F (36.5 C) 98.6 F (37 C)  TempSrc: Oral Oral Oral Oral  SpO2: 95% 91% 98% 100%  Weight:      Height:        Intake/Output Summary (Last 24 hours) at 09/19/2024 1436 Last data filed at 09/19/2024 1300 Gross per 24 hour  Intake 240 ml  Output --  Net 240 ml   Filed Weights   09/18/24 0421 09/18/24 1322 09/18/24 1748  Weight: 85.4 kg 83.8 kg 83.3 kg    Data Reviewed: I have personally reviewed and interpreted daily labs, tele strips, imagings as discussed above. I reviewed all nursing notes, pharmacy notes, vitals, pertinent old records I have discussed plan of care as described above with RN and patient/family.  CBC: Recent Labs  Lab 09/17/24 1800 09/18/24 0558 09/19/24 0502  WBC 7.4 7.0 7.1  NEUTROABS 6.2  --   --   HGB 9.9* 9.3* 9.3*  HCT 32.5* 31.3* 31.5*  MCV 101.2* 101.3* 103.6*  PLT 275 252 249   Basic Metabolic Panel: Recent Labs  Lab 09/17/24 1800 09/18/24 0558 09/19/24 0502  NA 139 138 135  K 5.3*  5.3* 4.7  CL 90* 91* 91*  CO2 22 19* 22  GLUCOSE 200* 153* 158*  BUN 44* 47* 27*  CREATININE 5.20* 5.58* 4.05*  CALCIUM  10.4* 10.2 9.7  MG  --  2.2  --   PHOS  --  4.5  --     Studies: ECHOCARDIOGRAM COMPLETE Result Date: 09/19/2024    ECHOCARDIOGRAM REPORT   Patient Name:   JAIMEY FRANCHINI Date of Exam: 09/19/2024 Medical Rec #:  [id number]    Height:       67.0 in Accession #:    [id number]   Weight:       183.6 lb Date of Birth:  1966-01-08    BSA:          1.950 m Patient Age:    58 years     BP:           90/59 mmHg Patient Gender: F            HR:           85 bpm. Exam Location:  [facility] Procedure: 2D Echo, Cardiac Doppler, Color Doppler and Saline Contrast Bubble            Study (Both Spectral and Color Flow Doppler were utilized during            procedure). Indications:     Stroke I63.9  History:         Patient has prior history of  Echocardiogram examinations, most                  recent 05/03/2024. Signs/Symptoms:Murmur; Risk Factors:Diabetes                  and Hypertension.  Sonographer:     Christopher Furnace Referring Phys:  [id number] OLADAPO ADEFESO Diagnosing Phys: Cara JONETTA Lovelace MD  Sonographer Comments: Suboptimal apical window. IMPRESSIONS  1. Left ventricular ejection fraction, by estimation, is 35 to 40%. The left ventricle has moderately decreased function. The left ventricle demonstrates global hypokinesis. The left ventricular internal cavity size was moderately to severely dilated. Left ventricular diastolic parameters are consistent with Grade III diastolic dysfunction (restrictive). There is the interventricular septum is flattened in diastole ('D' shaped left ventricle), consistent with right ventricular volume overload.  2. Right ventricular systolic function is mildly reduced. The right ventricular size is mildly enlarged.  3. Left atrial size was moderately dilated.  4. Right atrial size was mildly dilated.  5. The mitral valve is abnormal. Moderate to severe mitral valve regurgitation. Moderate mitral annular calcification.  6. Tricuspid valve regurgitation is severe.  7. The aortic valve is calcified. There is moderate calcification of the aortic valve. There is moderate thickening of the aortic valve. Aortic valve regurgitation is mild. Moderate to severe aortic valve stenosis.  8. Agitated saline contrast bubble study was positive with shunting observed within 3-6 cardiac cycles suggestive of interatrial shunt. FINDINGS  Left Ventricle: Left ventricular ejection fraction, by estimation, is 35 to 40%. The left ventricle has moderately decreased function. The left ventricle demonstrates global hypokinesis. Strain was performed and the global longitudinal strain is indeterminate. The left ventricular internal cavity size was moderately to severely dilated. There is borderline asymmetric left ventricular hypertrophy. The  interventricular septum is flattened in diastole ('D' shaped left ventricle), consistent with right ventricular volume overload. Left ventricular diastolic parameters are consistent with Grade III diastolic dysfunction (restrictive). Right Ventricle: The right ventricular size is mildly enlarged. No increase in right ventricular wall  thickness. Right ventricular systolic function is mildly reduced. Left Atrium: Left atrial size was moderately dilated. Right Atrium: Right atrial size was mildly dilated. Pericardium: There is no evidence of pericardial effusion. Mitral Valve: The mitral valve is abnormal. There is moderate thickening of the mitral valve leaflet(s). There is moderate calcification of the mitral valve leaflet(s). Moderate mitral annular calcification. Moderate to severe mitral valve regurgitation.  MV peak gradient, 12.7 mmHg. The mean mitral valve gradient is 6.0 mmHg. Tricuspid Valve: The tricuspid valve is grossly normal. Tricuspid valve regurgitation is severe. Aortic Valve: The aortic valve is calcified. There is moderate calcification of the aortic valve. There is moderate thickening of the aortic valve. There is moderate aortic valve annular calcification. Aortic valve regurgitation is mild. Moderate to severe aortic stenosis is present. Aortic valve mean gradient measures 13.7 mmHg. Aortic valve peak gradient measures 22.8 mmHg. Aortic valve area, by VTI measures 0.84 cm. Pulmonic Valve: The pulmonic valve was not well visualized. Pulmonic valve regurgitation is not visualized. Aorta: The aortic root was not well visualized. IAS/Shunts: The interatrial septum was not well visualized. Agitated saline contrast was given intravenously to evaluate for intracardiac shunting. Agitated saline contrast bubble study was positive with shunting observed within 3-6 cardiac cycles suggestive of interatrial shunt. Additional Comments: 3D was performed not requiring image post processing on an independent  workstation and was indeterminate.  LEFT VENTRICLE PLAX 2D LVIDd:         5.80 cm      Diastology LVIDs:         4.60 cm      LV e' medial:    5.11 cm/s LV PW:         0.80 cm      LV E/e' medial:  26.8 LV IVS:        1.10 cm      LV e' lateral:   10.60 cm/s LVOT diam:     2.00 cm      LV E/e' lateral: 12.9 LV SV:         31 LV SV Index:   16 LVOT Area:     3.14 cm LV IVRT:       74 msec  LV Volumes (MOD) LV vol d, MOD A4C: 112.0 ml LV vol s, MOD A4C: 80.2 ml LV SV MOD A4C:     112.0 ml RIGHT VENTRICLE RV Basal diam:  3.60 cm RV Mid diam:    3.10 cm RV S prime:     5.33 cm/s LEFT ATRIUM            Index        RIGHT ATRIUM           Index LA diam:      4.50 cm  2.31 cm/m   RA Area:     15.80 cm LA Vol (A4C): 115.0 ml 58.97 ml/m  RA Volume:   35.40 ml  18.15 ml/m  AORTIC VALVE AV Area (Vmax):    0.78 cm AV Area (Vmean):   0.71 cm AV Area (VTI):     0.84 cm AV Vmax:           239.00 cm/s AV Vmean:          170.667 cm/s AV VTI:            0.372 m AV Peak Grad:      22.8 mmHg AV Mean Grad:      13.7 mmHg LVOT Vmax:  59.10 cm/s LVOT Vmean:        38.700 cm/s LVOT VTI:          0.099 m LVOT/AV VTI ratio: 0.27  AORTA Ao Root diam: 2.90 cm MITRAL VALVE                  TRICUSPID VALVE MV Area (PHT): 3.76 cm       TR Peak grad:   35.3 mmHg MV Area VTI:   0.80 cm       TR Vmax:        297.00 cm/s MV Peak grad:  12.7 mmHg MV Mean grad:  6.0 mmHg       SHUNTS MV Vmax:       1.78 m/s       Systemic VTI:  0.10 m MV Vmean:      109.0 cm/s     Systemic Diam: 2.00 cm MV Decel Time: 202 msec MR Peak grad:    86.9 mmHg MR Mean grad:    48.0 mmHg MR Vmax:         466.00 cm/s MR Vmean:        321.0 cm/s MR PISA:         6.28 cm MR PISA Eff ROA: 44 mm MR PISA Radius:  1.00 cm MV E velocity: 137.00 cm/s MV A velocity: 45.60 cm/s MV E/A ratio:  3.00 Cara JONETTA Lovelace MD Electronically signed by Cara JONETTA Lovelace MD Signature Date/Time: 09/19/2024/1:48:52 PM    Final    US  Carotid Bilateral Result Date: 09/19/2024 EXAM:  US  CAROTID DUPLEX 09/19/2024 07:52:19 AM TECHNIQUE: Real-time grayscale, color flow and spectral Doppler sonographic images were obtained of the extracranial carotid system using a linear transducer. COMPARISON: None available CLINICAL HISTORY: Acute stroke due to ischemia (HCC), hypertension, stroke/TIA, hyperlipidemia, diabetes. FINDINGS: RIGHT: Common carotid artery: 52 cm/s Internal carotid artery: 60 cm/s External carotid artery: 48 cm/s Right ICA/CCA ratio: 1.2 Plaque: Calcified plaque in the carotid bulb resulting in only mild stenosis. Scattered eccentric nonocclusive partially calcified plaque in the right common carotid artery. No focal color aliasing. Waveforms are normal. Vertebral artery: Antegrade LEFT: Common carotid artery: 53 cm/s Internal carotid artery: 75 cm/s External carotid artery: 59 cm/s LEFT ICA/CCA ratio: 1.4 Plaque: Eccentric partially calcified plaque in the bulb resulting in mild stenosis. Scattered nonocclusive plaque in the common carotid artery. Normal color Doppler signal and waveforms. Vertebral artery: Antegrade IMPRESSION: 1. No hemodynamically significant stenosis (greater than 50%) within the extracranial internal carotid arteries. 2. Mild stenosis in the right carotid bulb from calcified plaque and in the left carotid bulb from eccentric partially calcified plaque. Electronically signed by: Dayne Hassell MD 09/19/2024 08:16 AM EST RP Workstation: [id number]    Scheduled Meds:  aspirin  EC  81 mg Oral Daily   Chlorhexidine  Gluconate Cloth  6 each Topical Q0600   gabapentin   100 mg Oral QHS   heparin  injection (subcutaneous)  5,000 Units Subcutaneous Q8H   insulin  aspart  0-6 Units Subcutaneous Q4H   midodrine   10 mg Oral TID WC   Continuous Infusions:  sodium thiosulfate  25 g in sodium chloride  0.9 % 200 mL Infusion for Calciphylaxis Stopped (09/18/24 1738)   PRN Meds: acetaminophen  **OR** acetaminophen , HYDROmorphone  (DILAUDID ) injection, ondansetron  **OR**  ondansetron  (ZOFRAN ) IV  Time spent: 55 minutes  Author: ELVAN SOR. MD [facility] 09/19/2024 2:36 PM  To reach On-call, see care teams to locate the attending and reach out to them via [email or web address]. If 7PM-7AM, please contact  night-coverage If you still have difficulty reaching the attending provider, please page the [facility] (Director on Call) for [facility] on amion for assistance.

## 2024-09-19 NOTE — Consult Note (Addendum)
 " [facility] CLINIC CARDIOLOGY CONSULT NOTE       Patient ID: [name] MRN: [id number] DOB/AGE: [birth year] 59 y.o.  Admit date: 09/17/2024 Referring Physician Dr. Elvan Sor Primary Physician Franchot Isaiah LABOR, MD  Primary Cardiologist Dr. Wilburn Reason for Consultation Abnormal echo  HPI: [name] is a 59 y.o. 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 Found to have acute CVA on CT head in the ED. Echo with positive bubble study. Cardiology was consulted for further evaluation.   Presented initially with complaints of confusion and noted to have acute stroke on CT head. Echo done today revealed EF 35-40%, global hypokinesis, grade II diastolic dysfunction, moderate to severe MR, severe TR, moderate to severe AS, positive bubble study suggestive of interatrial shunt. Labs today notable for creatinine 4.05, potassium 4.7, hemoglobin 9.3, WBC 7.1. Troponins on admit minimal and flat 114 > 106. EKG in the ED ventricular pacing rate 86 bpm.   At the time my evaluation this afternoon, patient is resting comfortably in hospital bed speaking with occupational therapy.  She is noted to have significant expressive aphasia, thus making history difficult to obtain.  Suspect that she is alert and oriented x 3 but unable to express this.  We discussed her echocardiogram results in detail.  She denies any chest discomfort, shortness of breath, dizziness, lightheadedness, syncope.  Also denies any lower extremity edema.  Review of systems complete and found to be negative unless listed above    Past Medical History:  Diagnosis Date   Allergy    Anemia    vitamin  d3 deficiency   Anxiety    Asthma    Calciphylaxis    Chronic kidney disease    End Stage Renal Disease   Diabetes mellitus without complication (HCC)    GERD (gastroesophageal reflux disease)    nothing over last few years   Heart murmur    High serum parathyroid hormone (PTH)    checked through Dialysis   History of kidney stones 2000   Hypertension    Neuromuscular disorder (HCC)    neuropathy in feet   Osteoporosis    PONV (postoperative nausea and vomiting)    severe nausea requiring many doses of post op antiemetics    Past Surgical History:  Procedure Laterality Date   ABDOMINAL HYSTERECTOMY  2007   APPLICATION OF WOUND VAC Left 11/15/2021   Procedure: APPLICATION OF WOUND VAC;  Surgeon: Marchia Drivers, MD;  Location: [facility] ORS;  Service: Orthopedics;  Laterality: Left;  Prevena 13cm    APPLICATION OF WOUND VAC Right 12/14/2021   Procedure: APPLICATION OF WOUND VAC;  Surgeon: Tobie Priest, MD;  Location: [facility] OR[facility];  Service: Orthopedics;  Laterality: Right;  [id number]   AV FISTULA INSERTION W/ RF MAGNETIC GUIDANCE N/A 12/08/2017   Procedure: AV FISTULA INSERTION W/RF MAGNETIC GUIDANCE;  Surgeon: Jama Cordella MATSU, MD;  Location: [facility] IN[facility]IVE [facility];  Service: Cardiovascular;  Laterality: N/A;   DIALYSIS/PERMA CATHETER INSERTION Right 10/03/2021   Procedure: DIALYSIS/PERMA CATHETER INSERTION;  Surgeon: Jama Cordella MATSU, MD;  Location: [facility] CV [facility];  Service: Cardiovascular;  Laterality: Right;   HIP ARTHROPLASTY Left 09/29/2021   Procedure: ARTHROPLASTY BIPOLAR HIP (HEMIARTHROPLASTY);  Surgeon: Leora Lynwood SAUNDERS,  MD;  Location: [facility];  Service: Orthopedics;  Laterality: Left;   HIP ARTHROPLASTY Right 12/14/2021   Procedure: ARTHROPLASTY BIPOLAR HIP (HEMIARTHROPLASTY);  Surgeon: Tobie Priest, MD;  Location: [facility];  Service: Orthopedics;  Laterality: Right;   INCISION AND DRAINAGE HIP Left 11/15/2021   Procedure: IRRIGATION AND DEBRIDEMENT LEFT HIP WOUND;  Surgeon:  Marchia Drivers, MD;  Location: [facility];  Service: Orthopedics;  Laterality: Left;   PACEMAKER LEADLESS INSERTION N/A 03/09/2024   Procedure: PACEMAKER LEADLESS INSERTION;  Surgeon: Nancey Eulas BRAVO, MD;  Location: [facility];  Service: Cardiovascular;  Laterality: N/A;   QUADRICEPS TENDON REPAIR Right 10/16/2021   Procedure: REPAIR QUADRICEP TENDON;  Surgeon: Doll Skates, MD;  Location: [facility];  Service: Orthopedics;  Laterality: Right;   ROTATOR CUFF REPAIR Right 2004   SHOULDER CLOSED REDUCTION Right 2004   UPPER EXTREMITY VENOGRAPHY Left 02/15/2018   Procedure: UPPER EXTREMITY VENOGRAPHY;  Surgeon: Jama Cordella MATSU, MD;  Location: [facility];  Service: Cardiovascular;  Laterality: Left;    Medications Prior to Admission  Medication Sig Dispense Refill Last Dose/Taking   albuterol  (VENTOLIN  HFA) 108 (90 Base) MCG/ACT inhaler Inhale 2 puffs into the lungs every 6 (six) hours as needed for wheezing or shortness of breath. 8 g 2 Unknown   B Complex-C-Folic Acid  (RENA-VITE PO) Take 1 tablet by mouth daily.   09/17/2024   Dupilumab 300 MG/2ML SOAJ Inject 300 mg into the skin.   Unknown   gabapentin  (NEURONTIN ) 100 MG capsule Take 1 capsule (100 mg total) by mouth at bedtime. 60 capsule 1 09/16/2024   ipratropium-albuterol  (DUONEB) 0.5-2.5 (3) MG/3ML SOLN Take 3 mLs by nebulization 4 (four) times daily. (Patient taking differently: Take 3 mLs by nebulization every 6 (six) hours as needed. Only as needed)   Unknown   naloxone  (NARCAN ) nasal spray 4 mg/0.1 mL Place 1 spray into the nose as needed for up to 365 doses (for opioid-induced respiratory depresssion). In case of emergency (overdose), spray once into each nostril. If no response within 3 minutes, repeat application and call 911. 1 each 1 Unknown   NOVOLOG  FLEXPEN 100 UNIT/ML FlexPen Inject 14 Units into the skin 3 (three) times daily with meals.   09/17/2024   oxyCODONE  (OXY IR/ROXICODONE ) 5 MG immediate release tablet Take  1 tablet (5 mg total) by mouth every 12 (twelve) hours as needed for severe pain (pain score 7-10). Must last 30 days. 60 tablet 0 Unknown   pentoxifylline  (TRENTAL ) 400 MG CR tablet Take 1 tablet (400 mg total) by mouth daily. 30 tablet 2 Unknown   sevelamer  carbonate (RENVELA ) 800 MG tablet Take 2,400 mg by mouth 3 (three) times daily with meals.   09/17/2024   SODIUM THIOSULFATE  IV Inject 12.5 mg into the vein 3 (three) times a week.   09/15/2024   Tenapanor HCl, CKD, 30 MG TABS Take 30 mg by mouth 2 (two) times daily.   09/16/2024   Continuous Glucose Sensor (DEXCOM G7 SENSOR) MISC Use to check blood sugar as needed for insulin  dependent type 2 diabetes 9 each 3    Continuous Glucose Sensor (DEXCOM G7 SENSOR) MISC Use to monitor blood sugar      [START ON 10/10/2024] oxyCODONE  (OXY IR/ROXICODONE ) 5 MG immediate release tablet Take 1 tablet (5 mg total) by mouth every 12 (twelve) hours as needed for severe pain (pain score 7-10). Must last 30 days. 60 tablet 0    [START ON 11/09/2024] oxyCODONE  (OXY IR/ROXICODONE ) 5 MG immediate  release tablet Take 1 tablet (5 mg total) by mouth every 12 (twelve) hours as needed for severe pain (pain score 7-10). Must last 30 days. 60 tablet 0    Social History   Socioeconomic History   Marital status: Married    Spouse name: Redell   Number of children: 0   Years of education: Not on file   Highest education level: Some college, no degree  Occupational History   Occupation: Scientist, [employer]  Tobacco Use   Smoking status: Never   Smokeless tobacco: Never  Vaping Use   Vaping status: Never Used  Substance and Sexual Activity   Alcohol use: Never   Drug use: Never   Sexual activity: Not Currently    Partners: Male    Birth control/protection: None  Other Topics Concern   Not on file  Social History Narrative   Not on file   Social Drivers of Health   Tobacco Use: Low Risk (09/17/2024)   Patient History    Smoking Tobacco Use: Never     Smokeless Tobacco Use: Never    Passive Exposure: Not on file  Financial Resource Strain: Low Risk (07/25/2024)   Overall Financial Resource Strain (CARDIA)    Difficulty of Paying Living Expenses: Not hard at all  Food Insecurity: No Food Insecurity (09/17/2024)   Epic    Worried About Programme Researcher, Broadcasting/film/video in the Last Year: Never true    Ran Out of Food in the Last Year: Never true  Transportation Needs: No Transportation Needs (09/17/2024)   Epic    Lack of Transportation (Medical): No    Lack of Transportation (Non-Medical): No  Physical Activity: Inactive (07/25/2024)   Exercise Vital Sign    Days of Exercise per Week: 0 days    Minutes of Exercise per Session: Not on file  Stress: Not on file  Social Connections: Moderately Isolated (09/17/2024)   Social Connection and Isolation Panel    Frequency of Communication with Friends and Family: More than three times a week    Frequency of Social Gatherings with Friends and Family: Three times a week    Attends Religious Services: Never    Active Member of Clubs or Organizations: No    Attends Banker Meetings: Never    Marital Status: Married  Catering Manager Violence: Not At Risk (09/17/2024)   Epic    Fear of Current or Ex-Partner: No    Emotionally Abused: No    Physically Abused: No    Sexually Abused: No  Depression (PHQ2-9): Low Risk (09/07/2024)   Depression (PHQ2-9)    PHQ-2 Score: 0  Recent Concern: Depression (PHQ2-9) - Medium Risk (07/03/2024)   Depression (PHQ2-9)    PHQ-2 Score: 6  Alcohol Screen: Not on file  Housing: Low Risk (09/17/2024)   Epic    Unable to Pay for Housing in the Last Year: No    Number of Times Moved in the Last Year: 0    Homeless in the Last Year: No  Utilities: Not At Risk (09/17/2024)   Epic    Threatened with loss of utilities: No  Health Literacy: Not on file    Family History  Problem Relation Age of Onset   Emphysema Mother    COPD Mother    Cancer Father    Cancer  Paternal Aunt    Diabetes Sister    Hypertension Sister    Eczema Sister    Diabetes Brother    Hypertension Brother  Cardiomyopathy Brother    Alcohol abuse Brother      Vitals:   09/18/24 2357 09/19/24 0356 09/19/24 0857 09/19/24 1200  BP: (!) 85/60 (!) 90/59 97/63 111/67  Pulse: 84 85 85 87  Resp: 18  18   Temp: 98.6 F (37 C) 97.9 F (36.6 C) 97.7 F (36.5 C) 98.6 F (37 C)  TempSrc: Oral Oral Oral Oral  SpO2: 95% 91% 98% 100%  Weight:      Height:        PHYSICAL EXAM General: Chronically ill-appearing female, well nourished, in no acute distress. HEENT: Normocephalic and atraumatic. Neck: No JVD.  Lungs: Normal respiratory effort on room air. Clear bilaterally to auscultation. No wheezes, crackles, rhonchi.  Heart: HRR. Normal S1 and S2 without gallops.  + Murmur.  Abdomen: Non-distended appearing.  Msk: Normal strength and tone for age. Extremities: Warm and well perfused. No clubbing, cyanosis.  No notable edema, leg wraps in place.  Neuro: Alert and oriented X 3. Psych: Answers questions appropriately.   Labs: Basic Metabolic Panel: Recent Labs    09/18/24 0558 09/19/24 0502  NA 138 135  K 5.3* 4.7  CL 91* 91*  CO2 19* 22  GLUCOSE 153* 158*  BUN 47* 27*  CREATININE 5.58* 4.05*  CALCIUM  10.2 9.7  MG 2.2  --   PHOS 4.5  --    Liver Function Tests: Recent Labs    09/17/24 1800 09/18/24 0558  AST <10* <10*  ALT <5 <5  ALKPHOS 179* 158*  BILITOT 0.5 0.6  PROT 6.7 6.1*  ALBUMIN  3.9 3.6   No results for input(s): LIPASE, AMYLASE in the last 72 hours. CBC: Recent Labs    09/17/24 1800 09/18/24 0558 09/19/24 0502  WBC 7.4 7.0 7.1  NEUTROABS 6.2  --   --   HGB 9.9* 9.3* 9.3*  HCT 32.5* 31.3* 31.5*  MCV 101.2* 101.3* 103.6*  PLT 275 252 249   Cardiac Enzymes: No results for input(s): CKTOTAL, CKMB, CKMBINDEX, TROPONINIHS in the last 72 hours. BNP: No results for input(s): BNP in the last 72 hours. D-Dimer: No results  for input(s): DDIMER in the last 72 hours. Hemoglobin A1C: Recent Labs    09/18/24 0558  HGBA1C 6.7*   Fasting Lipid Panel: Recent Labs    09/18/24 0558  CHOL 97  HDL 44  LDLCALC 32  TRIG 102  CHOLHDL 2.2   Thyroid  Function Tests: No results for input(s): TSH, T4TOTAL, T3FREE, THYROIDAB in the last 72 hours.  Invalid input(s): FREET3 Anemia Panel: Recent Labs    09/18/24 0558  TIBC 217*  IRON  41     Radiology: ECHOCARDIOGRAM COMPLETE Result Date: 09/19/2024    ECHOCARDIOGRAM REPORT   Patient Name:   [name] Date of Exam: 09/19/2024 Medical Rec #:  [id number]    Height:       67.0 in Accession #:    [id number]   Weight:       183.6 lb Date of Birth:  [birth year]    BSA:          1.950 m Patient Age:    58 years     BP:           90/59 mmHg Patient Gender: F            HR:           85 bpm. Exam Location:  [facility] Procedure: 2D Echo, Cardiac Doppler, Color Doppler and Saline Contrast Bubble  Study (Both Spectral and Color Flow Doppler were utilized during            procedure). Indications:     Stroke I63.9  History:         Patient has prior history of Echocardiogram examinations, most                  recent 05/03/2024. Signs/Symptoms:Murmur; Risk Factors:Diabetes                  and Hypertension.  Sonographer:     Christopher Furnace Referring Phys:  [id number] OLADAPO ADEFESO Diagnosing Phys: Cara JONETTA Lovelace MD  Sonographer Comments: Suboptimal apical window. IMPRESSIONS  1. Left ventricular ejection fraction, by estimation, is 35 to 40%. The left ventricle has moderately decreased function. The left ventricle demonstrates global hypokinesis. The left ventricular internal cavity size was moderately to severely dilated. Left ventricular diastolic parameters are consistent with Grade III diastolic dysfunction (restrictive). There is the interventricular septum is flattened in diastole ('D' shaped left ventricle), consistent with right ventricular volume overload.  2. Right  ventricular systolic function is mildly reduced. The right ventricular size is mildly enlarged.  3. Left atrial size was moderately dilated.  4. Right atrial size was mildly dilated.  5. The mitral valve is abnormal. Moderate to severe mitral valve regurgitation. Moderate mitral annular calcification.  6. Tricuspid valve regurgitation is severe.  7. The aortic valve is calcified. There is moderate calcification of the aortic valve. There is moderate thickening of the aortic valve. Aortic valve regurgitation is mild. Moderate to severe aortic valve stenosis.  8. Agitated saline contrast bubble study was positive with shunting observed within 3-6 cardiac cycles suggestive of interatrial shunt. FINDINGS  Left Ventricle: Left ventricular ejection fraction, by estimation, is 35 to 40%. The left ventricle has moderately decreased function. The left ventricle demonstrates global hypokinesis. Strain was performed and the global longitudinal strain is indeterminate. The left ventricular internal cavity size was moderately to severely dilated. There is borderline asymmetric left ventricular hypertrophy. The interventricular septum is flattened in diastole ('D' shaped left ventricle), consistent with right ventricular volume overload. Left ventricular diastolic parameters are consistent with Grade III diastolic dysfunction (restrictive). Right Ventricle: The right ventricular size is mildly enlarged. No increase in right ventricular wall thickness. Right ventricular systolic function is mildly reduced. Left Atrium: Left atrial size was moderately dilated. Right Atrium: Right atrial size was mildly dilated. Pericardium: There is no evidence of pericardial effusion. Mitral Valve: The mitral valve is abnormal. There is moderate thickening of the mitral valve leaflet(s). There is moderate calcification of the mitral valve leaflet(s). Moderate mitral annular calcification. Moderate to severe mitral valve regurgitation.  MV peak  gradient, 12.7 mmHg. The mean mitral valve gradient is 6.0 mmHg. Tricuspid Valve: The tricuspid valve is grossly normal. Tricuspid valve regurgitation is severe. Aortic Valve: The aortic valve is calcified. There is moderate calcification of the aortic valve. There is moderate thickening of the aortic valve. There is moderate aortic valve annular calcification. Aortic valve regurgitation is mild. Moderate to severe aortic stenosis is present. Aortic valve mean gradient measures 13.7 mmHg. Aortic valve peak gradient measures 22.8 mmHg. Aortic valve area, by VTI measures 0.84 cm. Pulmonic Valve: The pulmonic valve was not well visualized. Pulmonic valve regurgitation is not visualized. Aorta: The aortic root was not well visualized. IAS/Shunts: The interatrial septum was not well visualized. Agitated saline contrast was given intravenously to evaluate for intracardiac shunting. Agitated saline contrast bubble study was positive  with shunting observed within 3-6 cardiac cycles suggestive of interatrial shunt. Additional Comments: 3D was performed not requiring image post processing on an independent workstation and was indeterminate.  LEFT VENTRICLE PLAX 2D LVIDd:         5.80 cm      Diastology LVIDs:         4.60 cm      LV e' medial:    5.11 cm/s LV PW:         0.80 cm      LV E/e' medial:  26.8 LV IVS:        1.10 cm      LV e' lateral:   10.60 cm/s LVOT diam:     2.00 cm      LV E/e' lateral: 12.9 LV SV:         31 LV SV Index:   16 LVOT Area:     3.14 cm LV IVRT:       74 msec  LV Volumes (MOD) LV vol d, MOD A4C: 112.0 ml LV vol s, MOD A4C: 80.2 ml LV SV MOD A4C:     112.0 ml RIGHT VENTRICLE RV Basal diam:  3.60 cm RV Mid diam:    3.10 cm RV S prime:     5.33 cm/s LEFT ATRIUM            Index        RIGHT ATRIUM           Index LA diam:      4.50 cm  2.31 cm/m   RA Area:     15.80 cm LA Vol (A4C): 115.0 ml 58.97 ml/m  RA Volume:   35.40 ml  18.15 ml/m  AORTIC VALVE AV Area (Vmax):    0.78 cm AV Area  (Vmean):   0.71 cm AV Area (VTI):     0.84 cm AV Vmax:           239.00 cm/s AV Vmean:          170.667 cm/s AV VTI:            0.372 m AV Peak Grad:      22.8 mmHg AV Mean Grad:      13.7 mmHg LVOT Vmax:         59.10 cm/s LVOT Vmean:        38.700 cm/s LVOT VTI:          0.099 m LVOT/AV VTI ratio: 0.27  AORTA Ao Root diam: 2.90 cm MITRAL VALVE                  TRICUSPID VALVE MV Area (PHT): 3.76 cm       TR Peak grad:   35.3 mmHg MV Area VTI:   0.80 cm       TR Vmax:        297.00 cm/s MV Peak grad:  12.7 mmHg MV Mean grad:  6.0 mmHg       SHUNTS MV Vmax:       1.78 m/s       Systemic VTI:  0.10 m MV Vmean:      109.0 cm/s     Systemic Diam: 2.00 cm MV Decel Time: 202 msec MR Peak grad:    86.9 mmHg MR Mean grad:    48.0 mmHg MR Vmax:         466.00 cm/s MR Vmean:        321.0 cm/s MR PISA:  6.28 cm MR PISA Eff ROA: 44 mm MR PISA Radius:  1.00 cm MV E velocity: 137.00 cm/s MV A velocity: 45.60 cm/s MV E/A ratio:  3.00 Cara JONETTA Lovelace MD Electronically signed by Cara JONETTA Lovelace MD Signature Date/Time: 09/19/2024/1:48:52 PM    Final    US  Carotid Bilateral Result Date: 09/19/2024 EXAM: US  CAROTID DUPLEX 09/19/2024 07:52:19 AM TECHNIQUE: Real-time grayscale, color flow and spectral Doppler sonographic images were obtained of the extracranial carotid system using a linear transducer. COMPARISON: None available CLINICAL HISTORY: Acute stroke due to ischemia (HCC), hypertension, stroke/TIA, hyperlipidemia, diabetes. FINDINGS: RIGHT: Common carotid artery: 52 cm/s Internal carotid artery: 60 cm/s External carotid artery: 48 cm/s Right ICA/CCA ratio: 1.2 Plaque: Calcified plaque in the carotid bulb resulting in only mild stenosis. Scattered eccentric nonocclusive partially calcified plaque in the right common carotid artery. No focal color aliasing. Waveforms are normal. Vertebral artery: Antegrade LEFT: Common carotid artery: 53 cm/s Internal carotid artery: 75 cm/s External carotid artery: 59 cm/s  LEFT ICA/CCA ratio: 1.4 Plaque: Eccentric partially calcified plaque in the bulb resulting in mild stenosis. Scattered nonocclusive plaque in the common carotid artery. Normal color Doppler signal and waveforms. Vertebral artery: Antegrade IMPRESSION: 1. No hemodynamically significant stenosis (greater than 50%) within the extracranial internal carotid arteries. 2. Mild stenosis in the right carotid bulb from calcified plaque and in the left carotid bulb from eccentric partially calcified plaque. Electronically signed by: Katheleen Faes MD 09/19/2024 08:16 AM EST RP Workstation: [id number]   MR BRAIN WO CONTRAST Result Date: 09/18/2024 EXAM: MRI BRAIN WITHOUT CONTRAST 09/18/2024 01:10:00 AM TECHNIQUE: Multiplanar multisequence MRI of the head/brain was performed without the administration of intravenous contrast. COMPARISON: None available. CLINICAL HISTORY: Neuro deficit, acute, stroke suspected. FINDINGS: BRAIN AND VENTRICLES: There is a large area of acute ischemia affecting the posterior left MCA territory. Additionally, there are multiple punctate foci of acute ischemia within the centrum semiovale bilaterally. 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 No intracranial hemorrhage. No mass. No midline shift. No hydrocephalus. The sella is unremarkable. Normal flow voids. ORBITS: No significant abnormality. SINUSES AND MASTOIDS: No significant abnormality. BONES AND SOFT TISSUES: Normal marrow signal. No soft tissue abnormality. IMPRESSION: 1. Large area of acute ischemia affecting the posterior left MCA territory. 2. Multiple punctate foci of acute ischemia within the centrum semiovale bilaterally. 3. 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 Electronically signed by: Franky Stanford MD 09/18/2024 02:02 AM EST RP Workstation: [id number]   CT HEAD WO CONTRAST Result Date: 09/17/2024 EXAM: CT HEAD WITHOUT CONTRAST 09/17/2024 06:21:12 PM TECHNIQUE: CT of the head was performed  without the administration of intravenous contrast. Automated exposure control, iterative reconstruction, and/or weight based adjustment of the mA/kV was utilized to reduce the radiation dose to as low as reasonably achievable. COMPARISON: None available. CLINICAL HISTORY: Neuro deficit, acute, stroke suspected. FINDINGS: BRAIN AND VENTRICLES: Low density in the posterior left temporal and parietal lobes compatible with acute infarction. No hemorrhage. No hydrocephalus. No extra-axial collection. No mass effect or midline shift. ORBITS: No acute abnormality. SINUSES: No acute abnormality. SOFT TISSUES AND SKULL: No acute soft tissue abnormality. No skull fracture. IMPRESSION: 1. Acute infarction in the posterior left temporal and parietal lobes. This could be further evaluated with MRI if felt clinically indicated. Electronically signed by: Franky Crease MD 09/17/2024 06:30 PM EST RP Workstation: [id number]   DG Chest 2 View Result Date: 09/17/2024 EXAM: 2 VIEW(S) XRAY OF THE CHEST 09/17/2024 06:07:00 PM COMPARISON: 08/31/2024 CLINICAL HISTORY: AMS  FINDINGS: LINES, TUBES AND DEVICES: Right dialysis catheter remains in place, unchanged. LUNGS AND PLEURA: Low lung volumes. No confluent opacities, effusions, or edema. No pneumothorax. HEART AND MEDIASTINUM: Mild cardiomegaly. Leadless pacer remains in place, unchanged. BONES AND SOFT TISSUES: No acute osseous abnormality. IMPRESSION: 1. No acute cardiopulmonary process. Electronically signed by: Franky Crease MD 09/17/2024 06:21 PM EST RP Workstation: [id number]   MR TIBIA FIBULA LEFT WO CONTRAST Result Date: 08/31/2024 CLINICAL DATA:  Bilateral leg wounds.  Concern for osteomyelitis. EXAM: MRI OF LOWER LEFT EXTREMITY WITHOUT CONTRAST TECHNIQUE: Multiplanar, multisequence MR imaging of the left tibia and fibula was performed. No intravenous contrast was administered. COMPARISON:  Radiographs dated 08/30/2024. FINDINGS: Bones/Joint/Cartilage Patchy serpiginous  heterogenous signal abnormality in the proximal tibial diaphysis is most compatible with osteonecrosis. Fibula demonstrates normal marrow signal intensity. No convincing evidence of acute osteomyelitis. No fracture. Muscles and Tendons Generalized fatty atrophy with increased T2 signal of the posterior greater than anterior compartment calf musculature, likely reflecting chronic denervation changes, however, myositis is not excluded. No intramuscular collection. Intramuscular and intermuscular fat planes are maintained. Visualized patellar tendon is intact. Soft tissue Cutaneous wounds and irregularity of the anterior mid to distal shin. Diffuse cutaneous thickening and subcutaneous edema of the left calf. No loculated fluid collection. Findings are compatible with cellulitis in the appropriate setting. IMPRESSION: 1. No evidence of acute osteomyelitis. 2. Cutaneous wounds and irregularity of the anterior mid to distal shin. Diffuse cutaneous thickening and subcutaneous edema of the left calf. No loculated fluid collection. Findings are compatible with cellulitis in the appropriate setting. 3. Generalized fatty atrophy with increased T2 signal of the posterior greater than anterior compartment calf musculature, likely reflecting chronic denervation changes, however, myositis is not excluded. No intramuscular collection. 4. 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 Electronically Signed   By: Harrietta Sherry M.D.   On: 08/31/2024 16:49   MR TIBIA FIBULA RIGHT WO CONTRAST Result Date: 08/31/2024 CLINICAL DATA:  Bilateral leg wounds.  Concern for osteomyelitis. EXAM: MRI OF LOWER RIGHT EXTREMITY WITHOUT CONTRAST TECHNIQUE: Multiplanar, multisequence MR imaging of the right tibia and fibula was performed. No intravenous contrast was administered. 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 Fibula demonstrates normal marrow signal intensity. No convincing evidence of acute osteomyelitis. No fracture. Muscles and Tendons Generalized fatty atrophy with increased T2 signal of the anterior and posterior compartment calf musculature, likely reflecting chronic denervation changes, however, myositis is not excluded. No intramuscular collection. Intramuscular and intermuscular fat planes are maintained. Visualized patellar tendon is intact. Soft tissue Cutaneous wounds and irregularity of the mid to distal shin. Diffuse cutaneous thickening and subcutaneous edema of the right calf. No loculated fluid collection. Findings are compatible with cellulitis in the appropriate setting. IMPRESSION: 1. No evidence of acute osteomyelitis. 2. Cutaneous wounds and irregularity of the mid to distal shin. Diffuse cutaneous thickening and subcutaneous edema of the right calf. No loculated fluid collection. Findings are compatible with cellulitis in the appropriate setting. 3. Generalized fatty atrophy with increased T2 signal of the anterior and posterior compartment calf musculature, likely reflecting chronic denervation changes, however, myositis is not excluded. No intramuscular collection. 4. 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 Electronically Signed   By: Harrietta Sherry M.D.   On: 08/31/2024 16:43   DG Chest Port 1 View Result Date:  08/31/2024 CLINICAL DATA:  Pacemaker. Technologist notes state complicated wound infection. EXAM: PORTABLE CHEST 1 VIEW COMPARISON:  Radiographs 03/22/2024 FINDINGS: Lead less pacemaker projects over the left mediastinum. Right-sided dialysis catheter tip in the right atrium. Overall low lung volumes. Cardiomegaly is stable. Aortic atherosclerosis. No pulmonary edema. No confluent airspace disease. No pleural fluid or pneumothorax. Grossly stable osseous structures. IMPRESSION: 1. Lead  less pacemaker projects over the left mediastinum. 2. Stable cardiomegaly. Electronically Signed   By: Andrea Gasman M.D.   On: 08/31/2024 10:21   DG Tibia/Fibula Right Result Date: 08/30/2024 EXAM: 2 VIEW(S) XRAY OF THE RIGHT TIBIA AND FIBULA 08/30/2024 05:40:32 PM COMPARISON: None available. CLINICAL HISTORY: Complicated wound infection. FINDINGS: BONES AND JOINTS: There is some periosteal reaction along the mid and proximal diaphysis of the fibula. No cortical erosions are identified. No acute fracture. No malalignment. SOFT TISSUES: Diffuse soft tissue edema. Vascular calcifications and scattered phleboliths noted. IMPRESSION: 1. Periosteal reaction along the mid and proximal diaphysis of the fibula, with no cortical erosions identified. Findings can be seen in the setting of chronic osteomyelitis . 2. Diffuse soft tissue edema. Electronically signed by: Greig Pique MD 08/30/2024 06:55 PM EST RP Workstation: [id number]   DG Tibia/Fibula Left Result Date: 08/30/2024 CLINICAL DATA:  Wound infection. EXAM: DG TIBIA/FIBULA 2V*L* COMPARISON:  Radiograph dated 03/22/2024. FINDINGS: No acute fracture or dislocation. The bones are osteopenic. There is diffuse subcutaneous edema and scattered areas of subcutaneous calcification suggestive of chronic venous stasis. No soft tissue gas. IMPRESSION: 1. No acute fracture or dislocation. 2. Diffuse subcutaneous edema.  No soft tissue gas. Electronically Signed   By: Vanetta Chou M.D.   On: 08/30/2024 18:52    ECHO as above  TELEMETRY (personally reviewed): Ventricular pacing rate 80s  EKG (personally reviewed): Ventricular pacing rate 86 bpm  Data reviewed by me 09/19/2024: last 24h vitals tele labs imaging I/O ED provider note, admission H&P  Principal Problem:   Acute ischemic stroke (HCC)    ASSESSMENT AND PLAN:  [name] is a 59 y.o. 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 Found to have acute CVA on CT head in the ED. Echo with positive bubble study. Cardiology was consulted for further evaluation.   # Acute CVA # Positive bubble study # Chronic HFrEF # Valvular heart disease # ESRD on HD Patient with history of HFrEF, known valvular disease presented due to confusion and was found to have acute CVA on brain imaging.  MRI brain with large area of acute ischemia in the posterior left MCA territory as well as multiple punctate foci of acute ischemia in centrum semiovale bilaterally with old infarcts noted as well.  Echo this admission revealed EF 35-40%, global hypokinesis, grade 3 diastolic dysfunction, moderate to severe MR, moderate to severe AS, severe TR.  Echo also noted positive bubble study. - There is concern for cardioembolic source of acute CVA based on MRI brain findings. - Will have further discussions regarding next steps, she has poor functional status at baseline with multiple valvular issues which have been known for many months as well as HFrEF. -Minimal and flat troponin most consistent with demand/supply mismatch and not ACS.  This patient's plan of care was discussed and created with Dr. Florencio and he is in agreement.  Signed: Danita Bloch, PA-C  09/19/2024, 3:13 PM [facility]      "

## 2024-09-19 NOTE — TOC Initial Note (Signed)
 Transition of Care [facility]) - Initial/Assessment Note    Patient Details  Name: [name] MRN: [id number] Date of Birth: 1965/10/20  [facility] CM/SW Contact:    Dalia GORMAN Fuse, RN Phone Number: 09/19/2024, 11:58 AM  Clinical Narrative:                  TOC met with the patient and her husband in the room and introduced self and explained role. Patient mostly independent and verbalizes using wheelchair at home due to not being able to bear weight on BLE's. Patient lives at home with her husband Redell, he will assist with transportation at discharge. Therapy recs are for SNF. The patient doesn't want [facility], but is open to other facilities in [place] [facility] sent out.   Expected Discharge Plan: Skilled Nursing Facility Barriers to Discharge: Continued Medical Work up   Patient Goals and CMS Choice     Choice offered to / list presented to : Patient      Expected Discharge Plan and Services   Discharge Planning Services: CM Consult   Living arrangements for the past 2 months: Single Family Home                                      Prior Living Arrangements/Services Living arrangements for the past 2 months: Single Family Home Lives with:: Spouse                   Activities of Daily Living   ADL Screening (condition at time of admission) Independently performs ADLs?: Yes (appropriate for developmental age) Is the patient deaf or have difficulty hearing?: No Does the patient have difficulty seeing, even when wearing glasses/contacts?: No Does the patient have difficulty concentrating, remembering, or making decisions?: No  Permission Sought/Granted                  Emotional Assessment              Admission diagnosis:  Acute ischemic stroke (HCC) [I63.9] Expressive aphasia [R47.01] Cerebrovascular accident (CVA), unspecified mechanism (HCC) [I63.9] Patient Active Problem List   Diagnosis Date Noted   Acute ischemic stroke  (HCC) 09/17/2024   Complicated wound infection 08/30/2024   Pulmonary hypertension (HCC) 07/25/2024   Presence of heart assist device (HCC) 07/25/2024   Asthma 07/25/2024   Chronic pain syndrome 07/03/2024   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   PAD (peripheral artery disease) 05/01/2024   Polyarthritis 03/22/2024   Type 2 diabetes mellitus with end-stage renal disease (HCC) 03/20/2024   HFrEF (heart failure with reduced ejection fraction) (HCC) 03/20/2024   Mitral regurgitation 03/20/2024   Complete heart block (HCC) 03/02/2024   Osteoporosis with current pathological fracture 12/14/2021   History of hemiarthroplasty of left hip 11/19/2021   History of fracture of left hip 11/15/2021   Anemia of chronic disease 11/14/2021   Diabetic peripheral neuropathy (HCC)    Quadriceps tendon rupture 10/16/2021   ESRD on dialysis (HCC) 09/27/2018   Secondary hyperparathyroidism of renal origin 03/07/2018   Stage 5 chronic renal impairment associated with type 2 diabetes mellitus (HCC) 07/26/2017   Hyperphosphatemia 09/29/2016   Dyslipidemia 08/08/2015   PCP:  Franchot Isaiah LABOR, MD Pharmacy:   [facility] [phone] [address], [place] - [address] [place] [address] Phone: [phone] Fax: [phone]  [facility] [address] [place]  [place] [address] Phone: [phone] Fax: [phone]     Social Drivers of Health (SDOH) Social History: SDOH Screenings   Food Insecurity: No Food Insecurity (09/17/2024)  Housing: Low Risk (09/17/2024)  Transportation Needs: No Transportation Needs (09/17/2024)  Utilities: Not At Risk (09/17/2024)  Depression (PHQ2-9): Low Risk (09/07/2024)  Recent Concern: Depression (PHQ2-9) - Medium Risk (07/03/2024)  Financial Resource Strain: Low Risk (07/25/2024)  Physical Activity: Inactive (07/25/2024)  Social Connections: Moderately Isolated (09/17/2024)  Tobacco Use: Low Risk (09/17/2024)   SDOH Interventions:      Readmission Risk Interventions    03/03/2024    3:31 PM  Readmission Risk Prevention Plan  Transportation Screening Complete  PCP or Specialist Appt within 5-7 Days --  Home Care Screening Complete  Medication Review (RN CM) Complete

## 2024-09-19 NOTE — Progress Notes (Signed)
*  PRELIMINARY RESULTS* Echocardiogram 2D Echocardiogram has been performed.  [name] 09/19/2024, 8:50 AM

## 2024-09-20 ENCOUNTER — Inpatient Hospital Stay

## 2024-09-20 DIAGNOSIS — Q2112 Patent foramen ovale: Secondary | ICD-10-CM

## 2024-09-20 DIAGNOSIS — I639 Cerebral infarction, unspecified: Secondary | ICD-10-CM | POA: Diagnosis not present

## 2024-09-20 DIAGNOSIS — I350 Nonrheumatic aortic (valve) stenosis: Secondary | ICD-10-CM

## 2024-09-20 DIAGNOSIS — I5022 Chronic systolic (congestive) heart failure: Secondary | ICD-10-CM | POA: Insufficient documentation

## 2024-09-20 DIAGNOSIS — Z992 Dependence on renal dialysis: Secondary | ICD-10-CM | POA: Diagnosis not present

## 2024-09-20 DIAGNOSIS — N186 End stage renal disease: Secondary | ICD-10-CM | POA: Diagnosis not present

## 2024-09-20 LAB — CBC
HCT: 31.5 % — ABNORMAL LOW (ref 36.0–46.0)
Hemoglobin: 9.4 g/dL — ABNORMAL LOW (ref 12.0–15.0)
MCH: 30.1 pg (ref 26.0–34.0)
MCHC: 29.8 g/dL — ABNORMAL LOW (ref 30.0–36.0)
MCV: 101 fL — ABNORMAL HIGH (ref 80.0–100.0)
Platelets: 255 K/uL (ref 150–400)
RBC: 3.12 MIL/uL — ABNORMAL LOW (ref 3.87–5.11)
RDW: 17.2 % — ABNORMAL HIGH (ref 11.5–15.5)
WBC: 6.4 K/uL (ref 4.0–10.5)
nRBC: 0 % (ref 0.0–0.2)

## 2024-09-20 LAB — BASIC METABOLIC PANEL WITH GFR
Anion gap: 23 — ABNORMAL HIGH (ref 5–15)
BUN: 36 mg/dL — ABNORMAL HIGH (ref 6–20)
CO2: 20 mmol/L — ABNORMAL LOW (ref 22–32)
Calcium: 9.6 mg/dL (ref 8.9–10.3)
Chloride: 93 mmol/L — ABNORMAL LOW (ref 98–111)
Creatinine, Ser: 5.31 mg/dL — ABNORMAL HIGH (ref 0.44–1.00)
GFR, Estimated: 9 mL/min — ABNORMAL LOW
Glucose, Bld: 138 mg/dL — ABNORMAL HIGH (ref 70–99)
Potassium: 5 mmol/L (ref 3.5–5.1)
Sodium: 136 mmol/L (ref 135–145)

## 2024-09-20 LAB — GLUCOSE, CAPILLARY
Glucose-Capillary: 124 mg/dL — ABNORMAL HIGH (ref 70–99)
Glucose-Capillary: 138 mg/dL — ABNORMAL HIGH (ref 70–99)
Glucose-Capillary: 147 mg/dL — ABNORMAL HIGH (ref 70–99)
Glucose-Capillary: 164 mg/dL — ABNORMAL HIGH (ref 70–99)
Glucose-Capillary: 186 mg/dL — ABNORMAL HIGH (ref 70–99)

## 2024-09-20 MED ORDER — MIDODRINE HCL 5 MG PO TABS
5.0000 mg | ORAL_TABLET | Freq: Three times a day (TID) | ORAL | Status: DC
  Administered 2024-09-20 – 2024-09-21 (×4): 5 mg via ORAL
  Filled 2024-09-20 (×4): qty 1

## 2024-09-20 MED ORDER — HYDROCORTISONE ACETATE 25 MG RE SUPP
25.0000 mg | Freq: Two times a day (BID) | RECTAL | Status: DC
  Administered 2024-09-20 – 2024-09-21 (×2): 25 mg via RECTAL
  Filled 2024-09-20 (×3): qty 1

## 2024-09-20 MED ORDER — OXYCODONE-ACETAMINOPHEN 5-325 MG PO TABS
1.0000 | ORAL_TABLET | ORAL | Status: DC | PRN
  Administered 2024-09-20: 1 via ORAL
  Filled 2024-09-20: qty 1

## 2024-09-20 MED ORDER — HEPARIN SODIUM (PORCINE) 1000 UNIT/ML IJ SOLN
INTRAMUSCULAR | Status: AC
  Filled 2024-09-20: qty 5

## 2024-09-20 NOTE — Progress Notes (Signed)
 " [name]       Patient ID: [name] MRN: [id number] DOB/AGE: 61-Oct-1967 61 y.o.  Admit date: 09/17/2024 Referring Physician Dr. Elvan Sor Primary Physician Franchot Isaiah LABOR, MD  Primary Cardiologist Dr. Wilburn Reason for Consultation Abnormal echo  HPI: [name] is a 59 y.o. 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 Found to have acute CVA on CT head in the ED. Echo with positive bubble study. Cardiology was consulted for further evaluation.   Interval history: - Patient seen and examined this morning, sitting upright in hospital bed eating breakfast with RN at bedside. - States that she is feeling well today without any complaints.  Denies any chest discomfort, shortness of breath, lightheadedness, dizziness. - She did have questions about why she is getting lower extremity ultrasound, I discussed the indication for this with her this morning.  Review of systems complete and found to be negative unless listed above    Past Medical History:  Diagnosis Date   Allergy    Anemia    vitamin d3 deficiency   Anxiety    Asthma    Calciphylaxis    Chronic kidney disease    End Stage Renal Disease   Diabetes mellitus without complication (HCC)    GERD (gastroesophageal reflux disease)    nothing over last few years   Heart murmur    High serum parathyroid hormone (PTH)    checked through Dialysis   History of kidney stones 2000   Hypertension    Neuromuscular disorder (HCC)    neuropathy in feet   Osteoporosis    PONV (postoperative nausea and vomiting)    severe nausea requiring many doses of post  op antiemetics    Past Surgical History:  Procedure Laterality Date   ABDOMINAL HYSTERECTOMY  2007   APPLICATION OF WOUND VAC Left 11/15/2021   Procedure: APPLICATION OF WOUND VAC;  Surgeon: Marchia Drivers, MD;  Location: [facility];  Service: Orthopedics;  Laterality: Left;  Prevena 13cm    APPLICATION OF WOUND VAC Right 12/14/2021   Procedure: APPLICATION OF WOUND VAC;  Surgeon: Tobie Priest, MD;  Location: [facility];  Service: Orthopedics;  Laterality: Right;  [id number]   AV FISTULA INSERTION W/ RF MAGNETIC GUIDANCE N/A 12/08/2017   Procedure: AV FISTULA INSERTION W/RF MAGNETIC GUIDANCE;  Surgeon: Jama Cordella MATSU, MD;  Location: [facility];  Service: Cardiovascular;  Laterality: N/A;   DIALYSIS/PERMA CATHETER INSERTION Right 10/03/2021   Procedure: DIALYSIS/PERMA CATHETER INSERTION;  Surgeon: Jama Cordella MATSU, MD;  Location: [facility];  Service: Cardiovascular;  Laterality: Right;   HIP ARTHROPLASTY Left 09/29/2021   Procedure: ARTHROPLASTY BIPOLAR HIP (HEMIARTHROPLASTY);  Surgeon: Leora Lynwood SAUNDERS, MD;  Location: [facility];  Service: Orthopedics;  Laterality: Left;   HIP ARTHROPLASTY Right 12/14/2021   Procedure: ARTHROPLASTY BIPOLAR HIP (HEMIARTHROPLASTY);  Surgeon: Tobie Priest, MD;  Location: [facility];  Service: Orthopedics;  Laterality: Right;   INCISION AND DRAINAGE HIP Left 11/15/2021   Procedure: IRRIGATION AND DEBRIDEMENT LEFT HIP WOUND;  Surgeon: Marchia Drivers, MD;  Location: [facility];  Service: Orthopedics;  Laterality: Left;   PACEMAKER LEADLESS INSERTION N/A 03/09/2024   Procedure: PACEMAKER LEADLESS INSERTION;  Surgeon: Nancey,  Eulas BRAVO, MD;  Location: [facility];  Service: Cardiovascular;  Laterality: N/A;   QUADRICEPS TENDON REPAIR Right 10/16/2021   Procedure: REPAIR QUADRICEP TENDON;  Surgeon: Doll Skates, MD;  Location: [facility];  Service: Orthopedics;  Laterality: Right;   ROTATOR CUFF REPAIR Right 2004   SHOULDER CLOSED REDUCTION Right 2004    UPPER EXTREMITY VENOGRAPHY Left 02/15/2018   Procedure: UPPER EXTREMITY VENOGRAPHY;  Surgeon: Jama Cordella MATSU, MD;  Location: [facility];  Service: Cardiovascular;  Laterality: Left;    Medications Prior to Admission  Medication Sig Dispense Refill Last Dose/Taking   albuterol  (VENTOLIN  HFA) 108 (90 Base) MCG/ACT inhaler Inhale 2 puffs into the lungs every 6 (six) hours as needed for wheezing or shortness of breath. 8 g 2 Unknown   B Complex-C-Folic Acid  (RENA-VITE PO) Take 1 tablet by mouth daily.   09/17/2024   Dupilumab 300 MG/2ML SOAJ Inject 300 mg into the skin.   Unknown   gabapentin  (NEURONTIN ) 100 MG capsule Take 1 capsule (100 mg total) by mouth at bedtime. 60 capsule 1 09/16/2024   ipratropium-albuterol  (DUONEB) 0.5-2.5 (3) MG/3ML SOLN Take 3 mLs by nebulization 4 (four) times daily. (Patient taking differently: Take 3 mLs by nebulization every 6 (six) hours as needed. Only as needed)   Unknown   naloxone  (NARCAN ) nasal spray 4 mg/0.1 mL Place 1 spray into the nose as needed for up to 365 doses (for opioid-induced respiratory depresssion). In case of emergency (overdose), spray once into each nostril. If no response within 3 minutes, repeat application and call 911. 1 each 1 Unknown   NOVOLOG  FLEXPEN 100 UNIT/ML FlexPen Inject 14 Units into the skin 3 (three) times daily with meals.   09/17/2024   oxyCODONE  (OXY IR/ROXICODONE ) 5 MG immediate release tablet Take 1 tablet (5 mg total) by mouth every 12 (twelve) hours as needed for severe pain (pain score 7-10). Must last 30 days. 60 tablet 0 Unknown   pentoxifylline  (TRENTAL ) 400 MG CR tablet Take 1 tablet (400 mg total) by mouth daily. 30 tablet 2 Unknown   sevelamer  carbonate (RENVELA ) 800 MG tablet Take 2,400 mg by mouth 3 (three) times daily with meals.   09/17/2024   SODIUM THIOSULFATE  IV Inject 12.5 mg into the vein 3 (three) times a week.   09/15/2024   Tenapanor HCl, CKD, 30 MG TABS Take 30 mg by mouth 2 (two) times daily.    09/16/2024   Continuous Glucose Sensor (DEXCOM G7 SENSOR) MISC Use to check blood sugar as needed for insulin  dependent type 2 diabetes 9 each 3    Continuous Glucose Sensor (DEXCOM G7 SENSOR) MISC Use to monitor blood sugar      [START ON 10/10/2024] oxyCODONE  (OXY IR/ROXICODONE ) 5 MG immediate release tablet Take 1 tablet (5 mg total) by mouth every 12 (twelve) hours as needed for severe pain (pain score 7-10). Must last 30 days. 60 tablet 0    [START ON 11/09/2024] oxyCODONE  (OXY IR/ROXICODONE ) 5 MG immediate release tablet Take 1 tablet (5 mg total) by mouth every 12 (twelve) hours as needed for severe pain (pain score 7-10). Must last 30 days. 60 tablet 0    Social History   Socioeconomic History   Marital status: Married    Spouse name: Redell   Number of children: 0   Years of education: Not on file   Highest education level: Some college, no degree  Occupational History   Occupation: Scientist, [employer]  Tobacco Use   Smoking  status: Never   Smokeless tobacco: Never  Vaping Use   Vaping status: Never Used  Substance and Sexual Activity   Alcohol use: Never   Drug use: Never   Sexual activity: Not Currently    Partners: Male    Birth control/protection: None  Other Topics Concern   Not on file  Social History Narrative   Not on file   Social Drivers of Health   Tobacco Use: Low Risk (09/17/2024)   Patient History    Smoking Tobacco Use: Never    Smokeless Tobacco Use: Never    Passive Exposure: Not on file  Financial Resource Strain: Low Risk (07/25/2024)   Overall Financial Resource Strain (CARDIA)    Difficulty of Paying Living Expenses: Not hard at all  Food Insecurity: No Food Insecurity (09/17/2024)   Epic    Worried About Programme Researcher, Broadcasting/film/video in the Last Year: Never true    Ran Out of Food in the Last Year: Never true  Transportation Needs: No Transportation Needs (09/17/2024)   Epic    Lack of Transportation (Medical): No    Lack of Transportation  (Non-Medical): No  Physical Activity: Inactive (07/25/2024)   Exercise Vital Sign    Days of Exercise per Week: 0 days    Minutes of Exercise per Session: Not on file  Stress: Not on file  Social Connections: Moderately Isolated (09/17/2024)   Social Connection and Isolation Panel    Frequency of Communication with Friends and Family: More than three times a week    Frequency of Social Gatherings with Friends and Family: Three times a week    Attends Religious Services: Never    Active Member of Clubs or Organizations: No    Attends Banker Meetings: Never    Marital Status: Married  Catering Manager Violence: Not At Risk (09/17/2024)   Epic    Fear of Current or Ex-Partner: No    Emotionally Abused: No    Physically Abused: No    Sexually Abused: No  Depression (PHQ2-9): Low Risk (09/07/2024)   Depression (PHQ2-9)    PHQ-2 Score: 0  Recent Concern: Depression (PHQ2-9) - Medium Risk (07/03/2024)   Depression (PHQ2-9)    PHQ-2 Score: 6  Alcohol Screen: Not on file  Housing: Low Risk (09/17/2024)   Epic    Unable to Pay for Housing in the Last Year: No    Number of Times Moved in the Last Year: 0    Homeless in the Last Year: No  Utilities: Not At Risk (09/17/2024)   Epic    Threatened with loss of utilities: No  Health Literacy: Not on file    Family History  Problem Relation Age of Onset   Emphysema Mother    COPD Mother    Cancer Father    Cancer Paternal Aunt    Diabetes Sister    Hypertension Sister    Eczema Sister    Diabetes Brother    Hypertension Brother    Cardiomyopathy Brother    Alcohol abuse Brother      Vitals:   09/19/24 2000 09/20/24 0008 09/20/24 0408 09/20/24 0808  BP: (!) 97/59 93/66 95/65  94/70  Pulse:  77 85 85  Resp:      Temp: 98.8 F (37.1 C) 98 F (36.7 C) (!) 97.5 F (36.4 C) 97.6 F (36.4 C)  TempSrc: Oral Oral Oral Oral  SpO2: 95% 94% 95% 100%  Weight:      Height:  PHYSICAL EXAM General: Chronically  ill-appearing female, well nourished, in no acute distress. HEENT: Normocephalic and atraumatic. Neck: No JVD.  Lungs: Normal respiratory effort on room air. Clear bilaterally to auscultation. No wheezes, crackles, rhonchi.  Heart: HRR. Normal S1 and S2 without gallops.  + Murmur.  Abdomen: Non-distended appearing.  Msk: Normal strength and tone for age. Extremities: Warm and well perfused. No clubbing, cyanosis.  No notable edema, leg wraps in place.  Neuro: Alert and oriented X 3. Psych: Answers questions appropriately.   Labs: Basic Metabolic Panel: Recent Labs    09/18/24 0558 09/19/24 0502 09/20/24 0515  NA 138 135 136  K 5.3* 4.7 5.0  CL 91* 91* 93*  CO2 19* 22 20*  GLUCOSE 153* 158* 138*  BUN 47* 27* 36*  CREATININE 5.58* 4.05* 5.31*  CALCIUM  10.2 9.7 9.6  MG 2.2  --   --   PHOS 4.5  --   --    Liver Function Tests: Recent Labs    09/17/24 1800 09/18/24 0558  AST <10* <10*  ALT <5 <5  ALKPHOS 179* 158*  BILITOT 0.5 0.6  PROT 6.7 6.1*  ALBUMIN  3.9 3.6   No results for input(s): LIPASE, AMYLASE in the last 72 hours. CBC: Recent Labs    09/17/24 1800 09/18/24 0558 09/19/24 0502 09/20/24 0515  WBC 7.4   < > 7.1 6.4  NEUTROABS 6.2  --   --   --   HGB 9.9*   < > 9.3* 9.4*  HCT 32.5*   < > 31.5* 31.5*  MCV 101.2*   < > 103.6* 101.0*  PLT 275   < > 249 255   < > = values in this interval not displayed.   Cardiac Enzymes: No results for input(s): CKTOTAL, CKMB, CKMBINDEX, TROPONINIHS in the last 72 hours. BNP: No results for input(s): BNP in the last 72 hours. D-Dimer: No results for input(s): DDIMER in the last 72 hours. Hemoglobin A1C: Recent Labs    09/18/24 0558  HGBA1C 6.7*   Fasting Lipid Panel: Recent Labs    09/18/24 0558  CHOL 97  HDL 44  LDLCALC 32  TRIG 102  CHOLHDL 2.2   Thyroid  Function Tests: No results for input(s): TSH, T4TOTAL, T3FREE, THYROIDAB in the last 72 hours.  Invalid input(s):  FREET3 Anemia Panel: Recent Labs    09/18/24 0558  TIBC 217*  IRON  41     Radiology: ECHOCARDIOGRAM COMPLETE Result Date: 09/19/2024    ECHOCARDIOGRAM REPORT   Patient Name:   [name] Date of Exam: 09/19/2024 Medical Rec #:  [id number]    Height:       67.0 in Accession #:    [id number]   Weight:       183.6 lb Date of Birth:  1966/04/17    BSA:          1.950 m Patient Age:    58 years     BP:           90/59 mmHg Patient Gender: F            HR:           85 bpm. Exam Location:  [facility] Procedure: 2D Echo, Cardiac Doppler, Color Doppler and Saline Contrast Bubble            Study (Both Spectral and Color Flow Doppler were utilized during            procedure). Indications:     Stroke I63.9  History:  Patient has prior history of Echocardiogram examinations, most                  recent 05/03/2024. Signs/Symptoms:Murmur; Risk Factors:Diabetes                  and Hypertension.  Sonographer:     Christopher Furnace Referring Phys:  [id number] OLADAPO ADEFESO Diagnosing Phys: Cara JONETTA Lovelace MD  Sonographer Comments: Suboptimal apical window. IMPRESSIONS  1. Left ventricular ejection fraction, by estimation, is 35 to 40%. The left ventricle has moderately decreased function. The left ventricle demonstrates global hypokinesis. The left ventricular internal cavity size was moderately to severely dilated. Left ventricular diastolic parameters are consistent with Grade III diastolic dysfunction (restrictive). There is the interventricular septum is flattened in diastole ('D' shaped left ventricle), consistent with right ventricular volume overload.  2. Right ventricular systolic function is mildly reduced. The right ventricular size is mildly enlarged.  3. Left atrial size was moderately dilated.  4. Right atrial size was mildly dilated.  5. The mitral valve is abnormal. Moderate to severe mitral valve regurgitation. Moderate mitral annular calcification.  6. Tricuspid valve regurgitation is severe.  7. The aortic  valve is calcified. There is moderate calcification of the aortic valve. There is moderate thickening of the aortic valve. Aortic valve regurgitation is mild. Moderate to severe aortic valve stenosis.  8. Agitated saline contrast bubble study was positive with shunting observed within 3-6 cardiac cycles suggestive of interatrial shunt. FINDINGS  Left Ventricle: Left ventricular ejection fraction, by estimation, is 35 to 40%. The left ventricle has moderately decreased function. The left ventricle demonstrates global hypokinesis. Strain was performed and the global longitudinal strain is indeterminate. The left ventricular internal cavity size was moderately to severely dilated. There is borderline asymmetric left ventricular hypertrophy. The interventricular septum is flattened in diastole ('D' shaped left ventricle), consistent with right ventricular volume overload. Left ventricular diastolic parameters are consistent with Grade III diastolic dysfunction (restrictive). Right Ventricle: The right ventricular size is mildly enlarged. No increase in right ventricular wall thickness. Right ventricular systolic function is mildly reduced. Left Atrium: Left atrial size was moderately dilated. Right Atrium: Right atrial size was mildly dilated. Pericardium: There is no evidence of pericardial effusion. Mitral Valve: The mitral valve is abnormal. There is moderate thickening of the mitral valve leaflet(s). There is moderate calcification of the mitral valve leaflet(s). Moderate mitral annular calcification. Moderate to severe mitral valve regurgitation.  MV peak gradient, 12.7 mmHg. The mean mitral valve gradient is 6.0 mmHg. Tricuspid Valve: The tricuspid valve is grossly normal. Tricuspid valve regurgitation is severe. Aortic Valve: The aortic valve is calcified. There is moderate calcification of the aortic valve. There is moderate thickening of the aortic valve. There is moderate aortic valve annular calcification.  Aortic valve regurgitation is mild. Moderate to severe aortic stenosis is present. Aortic valve mean gradient measures 13.7 mmHg. Aortic valve peak gradient measures 22.8 mmHg. Aortic valve area, by VTI measures 0.84 cm. Pulmonic Valve: The pulmonic valve was not well visualized. Pulmonic valve regurgitation is not visualized. Aorta: The aortic root was not well visualized. IAS/Shunts: The interatrial septum was not well visualized. Agitated saline contrast was given intravenously to evaluate for intracardiac shunting. Agitated saline contrast bubble study was positive with shunting observed within 3-6 cardiac cycles suggestive of interatrial shunt. Additional Comments: 3D was performed not requiring image post processing on an independent workstation and was indeterminate.  LEFT VENTRICLE PLAX 2D LVIDd:  5.80 cm      Diastology LVIDs:         4.60 cm      LV e' medial:    5.11 cm/s LV PW:         0.80 cm      LV E/e' medial:  26.8 LV IVS:        1.10 cm      LV e' lateral:   10.60 cm/s LVOT diam:     2.00 cm      LV E/e' lateral: 12.9 LV SV:         31 LV SV Index:   16 LVOT Area:     3.14 cm LV IVRT:       74 msec  LV Volumes (MOD) LV vol d, MOD A4C: 112.0 ml LV vol s, MOD A4C: 80.2 ml LV SV MOD A4C:     112.0 ml RIGHT VENTRICLE RV Basal diam:  3.60 cm RV Mid diam:    3.10 cm RV S prime:     5.33 cm/s LEFT ATRIUM            Index        RIGHT ATRIUM           Index LA diam:      4.50 cm  2.31 cm/m   RA Area:     15.80 cm LA Vol (A4C): 115.0 ml 58.97 ml/m  RA Volume:   35.40 ml  18.15 ml/m  AORTIC VALVE AV Area (Vmax):    0.78 cm AV Area (Vmean):   0.71 cm AV Area (VTI):     0.84 cm AV Vmax:           239.00 cm/s AV Vmean:          170.667 cm/s AV VTI:            0.372 m AV Peak Grad:      22.8 mmHg AV Mean Grad:      13.7 mmHg LVOT Vmax:         59.10 cm/s LVOT Vmean:        38.700 cm/s LVOT VTI:          0.099 m LVOT/AV VTI ratio: 0.27  AORTA Ao Root diam: 2.90 cm MITRAL VALVE                   TRICUSPID VALVE MV Area (PHT): 3.76 cm       TR Peak grad:   35.3 mmHg MV Area VTI:   0.80 cm       TR Vmax:        297.00 cm/s MV Peak grad:  12.7 mmHg MV Mean grad:  6.0 mmHg       SHUNTS MV Vmax:       1.78 m/s       Systemic VTI:  0.10 m MV Vmean:      109.0 cm/s     Systemic Diam: 2.00 cm MV Decel Time: 202 msec MR Peak grad:    86.9 mmHg MR Mean grad:    48.0 mmHg MR Vmax:         466.00 cm/s MR Vmean:        321.0 cm/s MR PISA:         6.28 cm MR PISA Eff ROA: 44 mm MR PISA Radius:  1.00 cm MV E velocity: 137.00 cm/s MV A velocity: 45.60 cm/s MV E/A ratio:  3.00 Cara JONETTA Lovelace MD Electronically  signed by Cara JONETTA Lovelace MD Signature Date/Time: 09/19/2024/1:48:52 PM    Final    US  Carotid Bilateral Result Date: 09/19/2024 EXAM: US  CAROTID DUPLEX 09/19/2024 07:52:19 AM TECHNIQUE: Real-time grayscale, color flow and spectral Doppler sonographic images were obtained of the extracranial carotid system using a linear transducer. COMPARISON: None available CLINICAL HISTORY: Acute stroke due to ischemia (HCC), hypertension, stroke/TIA, hyperlipidemia, diabetes. FINDINGS: RIGHT: Common carotid artery: 52 cm/s Internal carotid artery: 60 cm/s External carotid artery: 48 cm/s Right ICA/CCA ratio: 1.2 Plaque: Calcified plaque in the carotid bulb resulting in only mild stenosis. Scattered eccentric nonocclusive partially calcified plaque in the right common carotid artery. No focal color aliasing. Waveforms are normal. Vertebral artery: Antegrade LEFT: Common carotid artery: 53 cm/s Internal carotid artery: 75 cm/s External carotid artery: 59 cm/s LEFT ICA/CCA ratio: 1.4 Plaque: Eccentric partially calcified plaque in the bulb resulting in mild stenosis. Scattered nonocclusive plaque in the common carotid artery. Normal color Doppler signal and waveforms. Vertebral artery: Antegrade IMPRESSION: 1. No hemodynamically significant stenosis (greater than 50%) within the extracranial internal carotid arteries. 2.  Mild stenosis in the right carotid bulb from calcified plaque and in the left carotid bulb from eccentric partially calcified plaque. Electronically signed by: Katheleen Faes MD 09/19/2024 08:16 AM EST RP Workstation: [id number]   MR BRAIN WO CONTRAST Result Date: 09/18/2024 EXAM: MRI BRAIN WITHOUT CONTRAST 09/18/2024 01:10:00 AM TECHNIQUE: Multiplanar multisequence MRI of the head/brain was performed without the administration of intravenous contrast. COMPARISON: None available. CLINICAL HISTORY: Neuro deficit, acute, stroke suspected. FINDINGS: BRAIN AND VENTRICLES: There is a large area of acute ischemia affecting the posterior left MCA territory. Additionally, there are multiple punctate foci of acute ischemia within the centrum semiovale bilaterally. 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 No intracranial hemorrhage. No mass. No midline shift. No hydrocephalus. The sella is unremarkable. Normal flow voids. ORBITS: No significant abnormality. SINUSES AND MASTOIDS: No significant abnormality. BONES AND SOFT TISSUES: Normal marrow signal. No soft tissue abnormality. IMPRESSION: 1. Large area of acute ischemia affecting the posterior left MCA territory. 2. Multiple punctate foci of acute ischemia within the centrum semiovale bilaterally. 3. 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 Electronically signed by: Franky Stanford MD 09/18/2024 02:02 AM EST RP Workstation: [id number]   CT HEAD WO CONTRAST Result Date: 09/17/2024 EXAM: CT HEAD WITHOUT CONTRAST 09/17/2024 06:21:12 PM TECHNIQUE: CT of the head was performed without the administration of intravenous contrast. Automated exposure control, iterative reconstruction, and/or weight based adjustment of the mA/kV was utilized to reduce the radiation dose to as low as reasonably achievable. COMPARISON: None available. CLINICAL HISTORY: Neuro deficit, acute, stroke suspected. FINDINGS: BRAIN AND VENTRICLES: Low density in the  posterior left temporal and parietal lobes compatible with acute infarction. No hemorrhage. No hydrocephalus. No extra-axial collection. No mass effect or midline shift. ORBITS: No acute abnormality. SINUSES: No acute abnormality. SOFT TISSUES AND SKULL: No acute soft tissue abnormality. No skull fracture. IMPRESSION: 1. Acute infarction in the posterior left temporal and parietal lobes. This could be further evaluated with MRI if felt clinically indicated. Electronically signed by: Franky Crease MD 09/17/2024 06:30 PM EST RP Workstation: [id number]   DG Chest 2 View Result Date: 09/17/2024 EXAM: 2 VIEW(S) XRAY OF THE CHEST 09/17/2024 06:07:00 PM COMPARISON: 08/31/2024 CLINICAL HISTORY: AMS FINDINGS: LINES, TUBES AND DEVICES: Right dialysis catheter remains in place, unchanged. LUNGS AND PLEURA: Low lung volumes. No confluent opacities, effusions, or edema. No pneumothorax. HEART AND MEDIASTINUM: Mild cardiomegaly. Leadless pacer remains  in place, unchanged. BONES AND SOFT TISSUES: No acute osseous abnormality. IMPRESSION: 1. No acute cardiopulmonary process. Electronically signed by: Franky Crease MD 09/17/2024 06:21 PM EST RP Workstation: [id number]   MR TIBIA FIBULA LEFT WO CONTRAST Result Date: 08/31/2024 CLINICAL DATA:  Bilateral leg wounds.  Concern for osteomyelitis. EXAM: MRI OF LOWER LEFT EXTREMITY WITHOUT CONTRAST TECHNIQUE: Multiplanar, multisequence MR imaging of the left tibia and fibula was performed. No intravenous contrast was administered. COMPARISON:  Radiographs dated 08/30/2024. FINDINGS: Bones/Joint/Cartilage Patchy serpiginous heterogenous signal abnormality in the proximal tibial diaphysis is most compatible with osteonecrosis. Fibula demonstrates normal marrow signal intensity. No convincing evidence of acute osteomyelitis. No fracture. Muscles and Tendons Generalized fatty atrophy with increased T2 signal of the posterior greater than anterior compartment calf musculature, likely reflecting  chronic denervation changes, however, myositis is not excluded. No intramuscular collection. Intramuscular and intermuscular fat planes are maintained. Visualized patellar tendon is intact. Soft tissue Cutaneous wounds and irregularity of the anterior mid to distal shin. Diffuse cutaneous thickening and subcutaneous edema of the left calf. No loculated fluid collection. Findings are compatible with cellulitis in the appropriate setting. IMPRESSION: 1. No evidence of acute osteomyelitis. 2. Cutaneous wounds and irregularity of the anterior mid to distal shin. Diffuse cutaneous thickening and subcutaneous edema of the left calf. No loculated fluid collection. Findings are compatible with cellulitis in the appropriate setting. 3. Generalized fatty atrophy with increased T2 signal of the posterior greater than anterior compartment calf musculature, likely reflecting chronic denervation changes, however, myositis is not excluded. No intramuscular collection. 4. 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 Electronically Signed   By: Harrietta Sherry M.D.   On: 08/31/2024 16:49   MR TIBIA FIBULA RIGHT WO CONTRAST Result Date: 08/31/2024 CLINICAL DATA:  Bilateral leg wounds.  Concern for osteomyelitis. EXAM: MRI OF LOWER RIGHT EXTREMITY WITHOUT CONTRAST TECHNIQUE: Multiplanar, multisequence MR imaging of the right tibia and fibula was performed. No intravenous contrast was administered. 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 Fibula demonstrates normal marrow signal intensity. No convincing evidence of acute osteomyelitis. No fracture. Muscles and Tendons Generalized fatty atrophy with increased T2 signal of the anterior and posterior compartment calf musculature, likely reflecting chronic denervation changes, however, myositis is not excluded. No  intramuscular collection. Intramuscular and intermuscular fat planes are maintained. Visualized patellar tendon is intact. Soft tissue Cutaneous wounds and irregularity of the mid to distal shin. Diffuse cutaneous thickening and subcutaneous edema of the right calf. No loculated fluid collection. Findings are compatible with cellulitis in the appropriate setting. IMPRESSION: 1. No evidence of acute osteomyelitis. 2. Cutaneous wounds and irregularity of the mid to distal shin. Diffuse cutaneous thickening and subcutaneous edema of the right calf. No loculated fluid collection. Findings are compatible with cellulitis in the appropriate setting. 3. Generalized fatty atrophy with increased T2 signal of the anterior and posterior compartment calf musculature, likely reflecting chronic denervation changes, however, myositis is not excluded. No intramuscular collection. 4. 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 Electronically Signed   By: Harrietta Sherry M.D.   On: 08/31/2024 16:43   DG Chest Port 1 View Result Date: 08/31/2024 CLINICAL DATA:  Pacemaker. Technologist notes state complicated wound infection. EXAM: PORTABLE CHEST 1 VIEW COMPARISON:  Radiographs 03/22/2024 FINDINGS: Lead less pacemaker projects over the left mediastinum. Right-sided dialysis catheter tip in  the right atrium. Overall low lung volumes. Cardiomegaly is stable. Aortic atherosclerosis. No pulmonary edema. No confluent airspace disease. No pleural fluid or pneumothorax. Grossly stable osseous structures. IMPRESSION: 1. Lead less pacemaker projects over the left mediastinum. 2. Stable cardiomegaly. Electronically Signed   By: Andrea Gasman M.D.   On: 08/31/2024 10:21   DG Tibia/Fibula Right Result Date: 08/30/2024 EXAM: 2 VIEW(S) XRAY OF THE RIGHT TIBIA AND FIBULA 08/30/2024 05:40:32 PM COMPARISON: None available. CLINICAL HISTORY: Complicated wound infection. FINDINGS: BONES  AND JOINTS: There is some periosteal reaction along the mid and proximal diaphysis of the fibula. No cortical erosions are identified. No acute fracture. No malalignment. SOFT TISSUES: Diffuse soft tissue edema. Vascular calcifications and scattered phleboliths noted. IMPRESSION: 1. Periosteal reaction along the mid and proximal diaphysis of the fibula, with no cortical erosions identified. Findings can be seen in the setting of chronic osteomyelitis . 2. Diffuse soft tissue edema. Electronically signed by: Greig Pique MD 08/30/2024 06:55 PM EST RP Workstation: [id number]   DG Tibia/Fibula Left Result Date: 08/30/2024 CLINICAL DATA:  Wound infection. EXAM: DG TIBIA/FIBULA 2V*L* COMPARISON:  Radiograph dated 03/22/2024. FINDINGS: No acute fracture or dislocation. The bones are osteopenic. There is diffuse subcutaneous edema and scattered areas of subcutaneous calcification suggestive of chronic venous stasis. No soft tissue gas. IMPRESSION: 1. No acute fracture or dislocation. 2. Diffuse subcutaneous edema.  No soft tissue gas. Electronically Signed   By: Vanetta Chou M.D.   On: 08/30/2024 18:52    ECHO as above  TELEMETRY (personally reviewed): Ventricular pacing rate 80s  EKG (personally reviewed): Ventricular pacing rate 86 bpm  Data reviewed by me 09/20/2024: last 24h vitals tele labs imaging I/O ED provider [name], admission H&P, hospitalist progress [name], neurology notes  Principal Problem:   Acute ischemic stroke [facility]) Active Problems:   ESRD on dialysis (HCC)   Calciphylaxis   Chronic systolic CHF (congestive heart failure) (HCC)   PFO (patent foramen ovale)    ASSESSMENT AND PLAN:  [name] is a 59 y.o. 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 Found to have acute CVA on CT head in the ED. Echo with positive bubble study. Cardiology was consulted for further evaluation.   # Acute CVA # Positive bubble study # Chronic HFrEF # Valvular heart disease # ESRD on HD Patient with history of HFrEF, known valvular disease presented due to confusion and was found to have acute CVA on brain imaging.  MRI brain with large area of acute ischemia in the posterior left MCA territory as well as multiple punctate foci of acute ischemia in centrum semiovale bilaterally with old infarcts noted as well.  Echo this admission revealed EF 35-40%, global hypokinesis, grade 3 diastolic dysfunction, moderate to severe MR, moderate to severe AS, severe TR.  Echo also noted positive bubble study with a few bubbles crossing IAS. - There is concern for cardioembolic source of acute CVA based on MRI brain findings. - Would not recommend further workup of possible small PFO as she has poor functional status at baseline with multiple valvular issues which have been known for many months as well as HFrEF. -Minimal and flat troponin most consistent  with demand/supply mismatch and not ACS.    This patient's plan of care was discussed and created with Dr. Florencio and he is in agreement.  Signed: Danita Bloch, PA-C  09/20/2024, 8:40 AM [facility]      "

## 2024-09-20 NOTE — TOC Progression Note (Addendum)
 Transition of Care [facility]) - Progression Note    Patient Details  Name: [name] MRN: [id number] Date of Birth: April 09, 1966  Transition of Care [facility]) CM/SW Contact  Dalia GORMAN Fuse, RN Phone Number: 09/20/2024, 1:35 PM  Clinical Narrative:    Patient has str bed offers from [facility] and [facility]. TOC went to the patients room to offer choice, but she is in dialysis. TOC spoke with Brianna at [facility] and they only have 1 dialysis chair left. She is going to send the referral for dialysis just in case the patient chooses [facility].   TOC spoke with the patient's husband. His preference is for the patient to keep her with [facility] ([address]) MWF 7:00 AM. She also has weekly wound care on Mondays. He would like for her to keep her chair at [facility] so he chooses [facility]. TOC provided the patient's husband with the contact info for [facility].  TOC spoke with Massie to accept the bed and selected [facility] in the hub. The patient has traditional Medicare.   TOC will continue to follow.   Expected Discharge Plan: Skilled Nursing Facility Barriers to Discharge: Continued Medical Work up               Expected Discharge Plan and Services   Discharge Planning Services: CM Consult   Living arrangements for the past 2 months: Single Family Home                                       Social Drivers of Health (SDOH) Interventions SDOH Screenings   Food Insecurity: No Food Insecurity (09/17/2024)  Housing: Low Risk (09/17/2024)  Transportation Needs: No Transportation Needs (09/17/2024)  Utilities: Not At Risk (09/17/2024)  Depression (PHQ2-9): Low Risk (09/07/2024)  Recent Concern: Depression (PHQ2-9) - Medium Risk (07/03/2024)  Financial Resource Strain: Low Risk (07/25/2024)  Physical Activity: Inactive (07/25/2024)  Social Connections: Moderately Isolated (09/17/2024)  Tobacco Use: Low Risk (09/17/2024)    Readmission Risk Interventions    03/03/2024    3:31 PM  Readmission Risk  Prevention Plan  Transportation Screening Complete  PCP or Specialist Appt within 5-7 Days --  Home Care Screening Complete  Medication Review (RN CM) Complete

## 2024-09-20 NOTE — Progress Notes (Addendum)
 " Progress Note   Patient: [name] FMW:[id number] DOB: [birth year] DOA: 09/17/2024     2 DOS: the patient was seen and examined on 09/20/2024   Brief hospital course: [name] is a 59 y.o. 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 MRI of the brain shows large left MCA stroke.  Patient is seen by neurology, treated with aspirin .   Principal Problem:   Acute ischemic stroke [facility]) Active Problems:   ESRD on dialysis [facility])   Calciphylaxis   Chronic systolic CHF (congestive heart failure) (HCC)   PFO (patent foramen ovale)   Assessment and Plan: # Acute ischemic stroke MRI brain: 1. Large area of acute ischemia affecting the posterior left MCA territory. 2. Multiple punctate foci of acute ischemia within the centrum semiovale bilaterally. 3. 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 Continue fall precautions and neuro checks Lipid panel LDL 32, very low <70 goal.  Per neurology, no need for statin due to lower LDL. Carotid ultrasound did not show any significant occlusion. TTE LVEF 35 to 40%, global hypokinesis, LV moderate to severely dilated, grade 3 DD, RV volume overload, D-shaped ventricle.  Moderate to severe MR, severe TR, moderate to severe AS, positive PFO. Seen by neurology, recommended to continue aspirin  81 mg p.o. daily, d/c'd statin and no need of DAPT at this time.  Chronic systolic congestive heart failure with ejection fraction 30 to 35%. Severe aortic stenosis. PFO. Moderate to severe mitral rotation. Severe tricuspid regurgitation Patient currently does not have any evidence of exacerbation congestive heart failure.  However, patient has severe abnormality in multiple heart valves.  Particularly aortic stenosis, reported as moderate to severe, give  ejection fraction 30 to 35%, stenosis would be severe. Patient also has severe TR and moderate to severe MR. Patient has been seen by cardiology, did not recommend aortic valve replacement.  Given patient current condition, prognosis is poor.  Ejection fraction will continue to deteriorate as with treatable valvular disease.  # Hyperkalemia End-stage renal disease on dialysis. Metabolic acidosis. Calciphylaxis/ Hypercalcemia possibly due to ESRD Patient followed by nephrology for dialysis.   # ESRD on HD (MWF) Nephrologist consulted for HD.Sodium thiosulfate  IV every MWF with hemodialysis and wound care.   S/p leadless pacemaker Micra AV 2 Prolonged QT interval Continue to follow.   Elevated troponin probably secondary to type II demand ischemia Troponin 114 > 106, troponin has flattened.  She denies chest pain.   Type 2 diabetes mellitus with hyperglycemia Hemoglobin A1c on 07/25/2024 was 6.5 Continue ISS and hypoglycemic protocol   Essential hypertension (controlled) Orthostatic hypotension. No antihypertensive medication noted on med rec Blood pressure seem to be better, reduce the dose of midodrine .   Mixed hyperlipidemia Statin not started with a lower LDL.  Rectal bleeding due to internal hemorrhoids. Patient has a history of internal hemorrhoids and rectal bleeding.  Had another episode today.  Will monitor hemoglobin, started rectal steroids.       Subjective:  Patient denies any short of breath or dizziness.  Physical Exam: Vitals:   09/20/24 1100 09/20/24 1130 09/20/24 1200 09/20/24 1330  BP: 104/66 (!) 89/58 91/74 106/67  Pulse: 85 83 85 87  Resp: 19 14 14 16   Temp:    98.3 F (36.8 C)  TempSrc:    Oral  SpO2: 97% 99% 99%  100%  Weight:      Height:       General exam: Appears calm and comfortable  Respiratory system: Clear to auscultation. Respiratory effort normal. Cardiovascular system: S1 & S2 heard, RRR. No JVD, murmurs, rubs, gallops or  clicks. No pedal edema. Gastrointestinal system: Abdomen is nondistended, soft and nontender. No organomegaly or masses felt. Normal bowel sounds heard. Central nervous system: Alert and oriented. No focal neurological deficits. Extremities: Leg wound noted Skin: No rashes, lesions or ulcers Psychiatry: Judgement and insight appear normal. Mood & affect appropriate.    Data Reviewed:  Echocardiogram and lab results reviewed.  Family Communication: Husband updated at bedside  Disposition: Status is: Inpatient Remains inpatient appropriate because: Severity of disease     Time spent: 35 minutes  Author: Murvin Mana, MD 09/20/2024 3:09 PM  For on call review [email or web address].    "

## 2024-09-20 NOTE — Progress Notes (Signed)
 SLP Cancellation Note  Patient Details Name: [name] MRN: [id number] DOB: [birth year]   Cancelled treatment:       Reason Eval/Treat Not Completed: Patient at procedure or test/unavailable  Pt off the floor at HD. Will attempt as able  [name] B. Rubbie, M.S., CCC-SLP, CBIS Speech-Language Pathologist Certified Brain Injury Specialist [facility] [phone] Ascom [phone] Fax [phone]   [name] 09/20/2024, 12:40 PM

## 2024-09-20 NOTE — Progress Notes (Signed)
 OT Cancellation Note  Patient Details Name: [name] MRN: [id number] DOB: [birth year]   Cancelled Treatment:    Reason Eval/Treat Not Completed: Other (comment);Patient at procedure or test/ unavailable (patient in HD this AM, attempted this PM and RN prepping patient for US  to look for DVTs.)  [name] Clause 09/20/2024, 3:19 PM

## 2024-09-20 NOTE — Plan of Care (Signed)

## 2024-09-20 NOTE — Progress Notes (Signed)
 Hemodialysis Note:  Received patient in bed to unit. Alert and oriented. Informed consent singed and in chart.  Treatment initiated: 0945 Treatment completed: 1330  Access used: Right internal jugular catheter Access issues: None  Patient tolerated well. Transported back to room, alert without acute distress. Report given to patient's RN.  Total UF removed: 1.5 Liters Medications given: Sodium Thiosulfate  IV  Post HD weight: 79.9 Kg  [name]

## 2024-09-20 NOTE — Hospital Course (Signed)
 [name] is a 59 y.o. 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 MRI of the brain shows large left MCA stroke.  Patient is seen by neurology, treated with aspirin . Patient will be transferred to nursing home for rehab.

## 2024-09-20 NOTE — Consult Note (Signed)
 "                                                                                   Consultation Note Date: 09/20/2024 at 1215  Patient Name: [name]  DOB: [birth year]  MRN: [id number]  Age / Sex: 59 y.o., female  PCP: Franchot Isaiah LABOR, MD Referring Physician: Laurita Pillion, MD  HPI/Patient Profile: 59 y.o. female  with past medical history of chronic systolic CHF with mildly reduced EF (40-45%), severe MR, TR, moderate aortic stenosis, HLD, DM2, ESRD on HD, calciphylaxis, HTN, and leadless PPM Micra AV2 in place admitted on 09/17/2024 with chief complaint of confusion.  As per chart review, patient was playing games on her computer tonight PTA suddenly started to slur her words and her speech did not make sense.  Last known well was 10:30 PM on 1/17.  Patient thought her symptoms were due to gabapentin  which she recently began taking.  Her symptoms persisted the next day and therefore decided to present to ED for further evaluation.  Upon presentation the emergency department, CT of head without contrast revealed acute infarction in the posterior left temporal and parietal lobes.  Neurology was consulted.  Aspirin  300 mg were given given patient failing her bedside swallow evaluation. MRI of head confirmed large area of acute ischemia affecting the posterior left MCA territory, multiple punctate foci on acute ischemia within the centrum semiovale paralateral laterally, and old left frontal and posterior parietal infarcts.  Given significant cardiac issues, ESRD, and new acute stroke, PMT was consulted to support patient and family with goals of care discussions.  Clinical Assessment and Goals of Care: Chart review completed prior to meeting patient including: -Labs: Creatinine elevated at 5.31-prior to today's hemodialysis session, GFR 9, hemoglobin 9.4 but stable as previous during this hospitalization are 9.3, 9.9,  -Vital signs: Vital signs are WNL with patient saturating at 100% on room  air -Imaging:Ultrasound bilateral carotids on 1/59 year old no hemodynamically significant stenosis within the intracranial internal carotid arteries but mild stenosis in the right carotid bulb from calcified plaque and in the left carotid bulb from eccentric partially calcified plaque, ultrasound of bilateral lower extremities pending -Progress notes: Reviewed TOC note when patient has bed offers from [facility] disability for patient to continue with dialysis at [facility] has been accepted bed at [facility] -Orders -Available advanced directive documents from current and previous encounters.  I then met with patient in HD suite.  She was asleep but easily awakened to my presence.  However, given her aphasia, she requested that we meet with her husband present.  After visiting with the patient, I spoke with her husband on the phone.  He plans to be bedside notes patient has returned from hemodialysis this afternoon.  I met with patient and her husband at bedside in her MedSurg room this afternoon to discuss diagnosis prognosis, GOC, EOL wishes, disposition and options.  During my discussion, RN was at bedside to removing dressing from patient's bilateral lower extremities/calciphylaxis in order for ultrasound to occur.  Additionally, attending came bedside as well as occupational therapist.  I introduced Palliative Medicine as specialized medical care for people living with serious illness.  It focuses on providing relief from the symptoms and stress of a serious illness. The goal is to improve quality of life for both the patient and the family.  We discussed patient's current illness and what it means in the larger context of patient's on-going co-morbidities.  Education provided on aortic valve stenosis, prognosis if not replaced, and need for close cardiac follow-up after this hospitalization.   Reviewed patient's acute stroke and education provided on expressive aphasia.  Patient is  able to clearly communicate their thoughts with every fifth or 6 word being switched.  She shares she wants this to go away.  Discussed need for extended therapies to assist patient with communication.   I attempted to elicit values and goals of care important to the patient.  However, with some any other providers in the room and plan to go to ultrasound shortly, patient, husband, and I agreed to speak tomorrow and tomorrow,/less busy time.  Discussed significance of advance directives, reviewing CODE STATUS, and discussing prognosis.  Patient and husband were appreciative of my visit and look forward to further discussion tomorrow.   Discussed with patient/family the importance of continued conversation with family and the medical providers regarding overall plan of care and treatment options, ensuring decisions are within the context of the patients values and GOCs.    Questions and concerns were addressed. The family was encouraged to call with questions or concerns.  I plan to follow-up with patient and her husband tomorrow at approximately lunchtime/12 PM in hopes of patient's husband being off of work and able to continue goals of care discussions at that time.  Primary Decision Maker PATIENT  Physical Exam Vitals reviewed.  Constitutional:      General: She is not in acute distress.    Appearance: She is normal weight.  HENT:     Head: Normocephalic.     Mouth/Throat:     Mouth: Mucous membranes are moist.  Eyes:     Pupils: Pupils are equal, round, and reactive to light.  Pulmonary:     Effort: Pulmonary effort is normal.  Abdominal:     Palpations: Abdomen is soft.  Skin:    General: Skin is warm and dry.  Neurological:     Mental Status: She is alert and oriented to person, place, and time.  Psychiatric:        Mood and Affect: Mood normal.        Behavior: Behavior normal.     Comments: Expressive aphasia     Palliative Assessment/Data: 50-60%     Thank you  for this consult. Palliative medicine will continue to follow and assist holistically.   I personally spent a total of 75 minutes in the care of the patient today including preparing to see the patient, getting/reviewing separately obtained history, performing a medically appropriate exam/evaluation, counseling and educating, referring and communicating with other health care professionals, and documenting clinical information in the EHR.   Signed by: Lamarr Gunner, DNP, FNP-BC Palliative Medicine   Please contact Palliative Medicine Team providers via [facility] for questions and concerns.                "

## 2024-09-20 NOTE — Progress Notes (Signed)
 Mobility Specialist Progress Note:    09/20/24 1535  Mobility  Activity Pivoted/transferred to/from BSC  Level of Assistance Moderate assist, patient does 50-74% (+2)  Assistive Device Other (Comment) (HHA)  Activity Response Tolerated well  Mobility visit 1 Mobility  Mobility Specialist Start Time (ACUTE ONLY) 1411  Mobility Specialist Stop Time (ACUTE ONLY) 1420  Mobility Specialist Time Calculation (min) (ACUTE ONLY) 9 min   Nursing staff asked this MS and Albert (MS) to assist pt to [facility]. Pt required Mod/MaxA +2 to stand and pivot. Tolerated well, after sitting on BSC pt has blood in commode and when wiping. RN at bedside to assess. Left pt fowlers, husband and RN in room. All needs met.  Sherrilee Ditty Mobility Specialist Please contact via Special Educational Needs Teacher or  Rehab office at [phone]

## 2024-09-20 NOTE — Progress Notes (Signed)
 " [facility]  ROUNDING NOTE   Subjective:   [name] s a 59 year old female with past medical conditions including anemia, GERD, hypertension, stroke, diabetes, and end-stage renal disease on hemodialysis. Patient presents to the emergency department for evaluation of altered mental status. She has been admitted for Acute ischemic stroke [facility]) [I63.9] Expressive aphasia [R47.01] Cerebrovascular accident (CVA), unspecified mechanism (HCC) [I63.9]  Patient is known to our practice and receives outpatient dialysis treatments at [facility] MWF schedule, supervised by Dr. Marcelino.   Patient seen and evaluated during dialysis   HEMODIALYSIS FLOWSHEET:  Blood Flow Rate (mL/min): 350 mL/min Arterial Pressure (mmHg): -159.79 mmHg Venous Pressure (mmHg): 192.72 mmHg TMP (mmHg): 13.73 mmHg Ultrafiltration Rate (mL/min): 857 mL/min Dialysate Flow Rate (mL/min): 299 ml/min  Remains confused    Objective:  Vital signs in last 24 hours:  Temp:  [97.5 F (36.4 C)-98.8 F (37.1 C)] 97.8 F (36.6 C) (01/21 0925) Pulse Rate:  [77-88] 86 (01/21 1000) Resp:  [14-21] 14 (01/21 1000) BP: (93-117)/(59-81) 106/80 (01/21 1000) SpO2:  [94 %-100 %] 99 % (01/21 1000) Weight:  [81.4 kg] 81.4 kg (01/21 0925)  Weight change:  Filed Weights   09/18/24 1322 09/18/24 1748 09/20/24 0925  Weight: 83.8 kg 83.3 kg 81.4 kg    Intake/Output: I/O last 3 completed shifts: In: 480 [P.O.:480] Out: 750 [Urine:750]   Intake/Output this shift:  No intake/output data recorded.  Physical Exam: General: NAD  Head: Normocephalic, atraumatic. Moist oral mucosal membranes  Eyes: Anicteric  Lungs:  Clear to auscultation  Heart: Regular rate and rhythm, paced  Abdomen:  Soft, nontender  Extremities:  No peripheral edema.  Neurologic: Awake, oriented to self  Skin: BLE ace bandages  Access: Rt permcath    Basic Metabolic Panel: Recent Labs  Lab 09/17/24 1800 09/18/24 0558  09/19/24 0502 09/20/24 0515  NA 139 138 135 136  K 5.3* 5.3* 4.7 5.0  CL 90* 91* 91* 93*  CO2 22 19* 22 20*  GLUCOSE 200* 153* 158* 138*  BUN 44* 47* 27* 36*  CREATININE 5.20* 5.58* 4.05* 5.31*  CALCIUM  10.4* 10.2 9.7 9.6  MG  --  2.2  --   --   PHOS  --  4.5  --   --     Liver Function Tests: Recent Labs  Lab 09/17/24 1800 09/18/24 0558  AST <10* <10*  ALT <5 <5  ALKPHOS 179* 158*  BILITOT 0.5 0.6  PROT 6.7 6.1*  ALBUMIN  3.9 3.6   No results for input(s): LIPASE, AMYLASE in the last 168 hours. No results for input(s): AMMONIA in the last 168 hours.  CBC: Recent Labs  Lab 09/17/24 1800 09/18/24 0558 09/19/24 0502 09/20/24 0515  WBC 7.4 7.0 7.1 6.4  NEUTROABS 6.2  --   --   --   HGB 9.9* 9.3* 9.3* 9.4*  HCT 32.5* 31.3* 31.5* 31.5*  MCV 101.2* 101.3* 103.6* 101.0*  PLT 275 252 249 255    Cardiac Enzymes: No results for input(s): CKTOTAL, CKMB, CKMBINDEX, TROPONINI in the last 168 hours.  BNP: Invalid input(s): POCBNP  CBG: Recent Labs  Lab 09/19/24 1532 09/19/24 2004 09/20/24 0003 09/20/24 0423 09/20/24 0736  GLUCAP 176* 134* 124* 147* 138*    Microbiology: Results for orders placed or performed during the hospital encounter of 08/30/24  Blood Culture (routine x 2)     Status: None   Collection Time: 08/30/24  5:21 PM   Specimen: BLOOD  Result Value Ref Range Status  Specimen Description BLOOD BLOOD RIGHT HAND  Final   Special Requests   Final    BOTTLES DRAWN AEROBIC AND ANAEROBIC Blood Culture results may not be optimal due to an inadequate volume of blood received in culture bottles   Culture   Final    NO GROWTH 5 DAYS Performed at [facility], [address], [place], [place] [address]    Report Status 09/04/2024 FINAL  Final  Blood Culture (routine x 2)     Status: None   Collection Time: 08/30/24  5:25 PM   Specimen: BLOOD  Result Value Ref Range Status   Specimen Description BLOOD RIGHT ANTECUBITAL   Final   Special Requests   Final    BOTTLES DRAWN AEROBIC AND ANAEROBIC Blood Culture adequate volume   Culture   Final    NO GROWTH 5 DAYS Performed at [facility], [address], [place], [place] [address]    Report Status 09/04/2024 FINAL  Final  Aerobic Culture w Gram Stain (superficial specimen)     Status: None   Collection Time: 08/31/24  1:14 AM   Specimen: Leg; Wound  Result Value Ref Range Status   Specimen Description   Final    LEG Performed at [facility], [address] [place], [place] [address]    Special Requests   Final    NONE Performed at [facility], [address] [place], [place] [address]    Gram Stain   Final    NO WBC SEEN FEW GRAM NEGATIVE RODS RARE GRAM POSITIVE RODS RARE GRAM POSITIVE COCCI    Culture   Final    ABUNDANT PSEUDOMONAS ALCALIGENES FEW ENTEROCOCCUS FAECALIS ABUNDANT STENOTROPHOMONAS MALTOPHILIA Sent to [facility] for further susceptibility testing. SEE SEPARATE REPORT Performed at [facility], [address], [place], [place] [address]    Report Status 09/10/2024 FINAL  Final   Organism ID, Bacteria ENTEROCOCCUS FAECALIS  Final   Organism ID, Bacteria PSEUDOMONAS ALCALIGENES  Final   Organism ID, Bacteria PSEUDOMONAS ALCALIGENES  Final      Susceptibility   Enterococcus faecalis - MIC*    AMPICILLIN <=2 SENSITIVE Sensitive     VANCOMYCIN  1 SENSITIVE Sensitive     GENTAMICIN SYNERGY SENSITIVE Sensitive     * FEW ENTEROCOCCUS FAECALIS   Pseudomonas alcaligenes - MIC*    MEROPENEM 8 INTERMEDIATE Intermediate     CIPROFLOXACIN 0.5 SENSITIVE Sensitive     PIP/TAZO Value in next row Intermediate      32 INTERMEDIATEThis is a modified FDA-approved test that has been validated and its performance characteristics determined by the reporting laboratory.  This laboratory is certified under the Clinical Laboratory Improvement Amendments CLIA as qualified to perform high complexity clinical laboratory  testing.    CEFEPIME  Value in next row Sensitive      32 INTERMEDIATEThis is a modified FDA-approved test that has been validated and its performance characteristics determined by the reporting laboratory.  This laboratory is certified under the Clinical Laboratory Improvement Amendments CLIA as qualified to perform high complexity clinical laboratory testing.   Pseudomonas alcaligenes - MIC*    CEFOTAXIME (Non-meningitis) Value in next row Intermediate      32 INTERMEDIATEThis is a modified FDA-approved test that has been validated and its performance characteristics determined by the reporting laboratory.  This laboratory is certified under the Clinical Laboratory Improvement Amendments CLIA as qualified to perform high complexity clinical laboratory testing.    * ABUNDANT PSEUDOMONAS ALCALIGENES  ABUNDANT PSEUDOMONAS ALCALIGENES  Susceptibility, Aer + Anaerob     Status: Abnormal   Collection Time: 08/31/24  1:14 AM  Result Value Ref Range Status   Suscept, Aer + Anaerob Final report (A)  Corrected    Comment: (NOTE) Performed At: [facility] [address] [place], [place] [id number] Jennette Shorter MD Ey:[id number] CORRECTED ON 01/11 AT 0736: PREVIOUSLY REPORTED AS Preliminary report    Source PENDING  Incomplete  Susceptibility Result     Status: Abnormal   Collection Time: 08/31/24  1:14 AM  Result Value Ref Range Status   Suscept Result 1 Comment (A)  Final    Comment: (NOTE) Stenotrophomonas maltophilia Identification performed by account, not confirmed by this laboratory. Levofloxacin and Trimethoprim-Sulfamethoxazole should not be used alone for antimicrobial therapy (CLSI).    Antimicrobial Suscept Comment  Corrected    Comment: (NOTE)      ** S = Susceptible; I = Intermediate; R = Resistant **                   P = Positive; N = Negative            MICS are expressed in micrograms per mL   Antibiotic                 RSLT#1    RSLT#2    RSLT#3     RSLT#4 Levofloxacin                   S Minocycline                    S Trimethoprim/Sulfa             S Performed At: [facility] [address] [place], [place] [id number] Jennette Shorter MD Ey:[id number]     Coagulation Studies: Recent Labs    09/17/24 1800  LABPROT 16.6*  INR 1.3*    Urinalysis: No results for input(s): COLORURINE, LABSPEC, PHURINE, GLUCOSEU, HGBUR, BILIRUBINUR, KETONESUR, PROTEINUR, UROBILINOGEN, NITRITE, LEUKOCYTESUR in the last 72 hours.  Invalid input(s): APPERANCEUR    Imaging: ECHOCARDIOGRAM COMPLETE Result Date: 09/19/2024    ECHOCARDIOGRAM REPORT   Patient Name:   [name] Date of Exam: 09/19/2024 Medical Rec #:  [id number]    Height:       67.0 in Accession #:    [id number]   Weight:       183.6 lb Date of Birth:  June 30, 1966    BSA:          1.950 m Patient Age:    58 years     BP:           90/59 mmHg Patient Gender: F            HR:           85 bpm. Exam Location:  [facility] Procedure: 2D Echo, Cardiac Doppler, Color Doppler and Saline Contrast Bubble            Study (Both Spectral and Color Flow Doppler were utilized during            procedure). Indications:     Stroke I63.9  History:         Patient has prior history of Echocardiogram examinations, most                  recent 05/03/2024. Signs/Symptoms:Murmur; Risk Factors:Diabetes  and Hypertension.  Sonographer:     Christopher Furnace Referring Phys:  [id number] OLADAPO ADEFESO Diagnosing Phys: Cara JONETTA Lovelace MD  Sonographer Comments: Suboptimal apical window. IMPRESSIONS  1. Left ventricular ejection fraction, by estimation, is 35 to 40%. The left ventricle has moderately decreased function. The left ventricle demonstrates global hypokinesis. The left ventricular internal cavity size was moderately to severely dilated. Left ventricular diastolic parameters are consistent with Grade III diastolic dysfunction (restrictive). There is the interventricular septum is  flattened in diastole ('D' shaped left ventricle), consistent with right ventricular volume overload.  2. Right ventricular systolic function is mildly reduced. The right ventricular size is mildly enlarged.  3. Left atrial size was moderately dilated.  4. Right atrial size was mildly dilated.  5. The mitral valve is abnormal. Moderate to severe mitral valve regurgitation. Moderate mitral annular calcification.  6. Tricuspid valve regurgitation is severe.  7. The aortic valve is calcified. There is moderate calcification of the aortic valve. There is moderate thickening of the aortic valve. Aortic valve regurgitation is mild. Moderate to severe aortic valve stenosis.  8. Agitated saline contrast bubble study was positive with shunting observed within 3-6 cardiac cycles suggestive of interatrial shunt. FINDINGS  Left Ventricle: Left ventricular ejection fraction, by estimation, is 35 to 40%. The left ventricle has moderately decreased function. The left ventricle demonstrates global hypokinesis. Strain was performed and the global longitudinal strain is indeterminate. The left ventricular internal cavity size was moderately to severely dilated. There is borderline asymmetric left ventricular hypertrophy. The interventricular septum is flattened in diastole ('D' shaped left ventricle), consistent with right ventricular volume overload. Left ventricular diastolic parameters are consistent with Grade III diastolic dysfunction (restrictive). Right Ventricle: The right ventricular size is mildly enlarged. No increase in right ventricular wall thickness. Right ventricular systolic function is mildly reduced. Left Atrium: Left atrial size was moderately dilated. Right Atrium: Right atrial size was mildly dilated. Pericardium: There is no evidence of pericardial effusion. Mitral Valve: The mitral valve is abnormal. There is moderate thickening of the mitral valve leaflet(s). There is moderate calcification of the mitral  valve leaflet(s). Moderate mitral annular calcification. Moderate to severe mitral valve regurgitation.  MV peak gradient, 12.7 mmHg. The mean mitral valve gradient is 6.0 mmHg. Tricuspid Valve: The tricuspid valve is grossly normal. Tricuspid valve regurgitation is severe. Aortic Valve: The aortic valve is calcified. There is moderate calcification of the aortic valve. There is moderate thickening of the aortic valve. There is moderate aortic valve annular calcification. Aortic valve regurgitation is mild. Moderate to severe aortic stenosis is present. Aortic valve mean gradient measures 13.7 mmHg. Aortic valve peak gradient measures 22.8 mmHg. Aortic valve area, by VTI measures 0.84 cm. Pulmonic Valve: The pulmonic valve was not well visualized. Pulmonic valve regurgitation is not visualized. Aorta: The aortic root was not well visualized. IAS/Shunts: The interatrial septum was not well visualized. Agitated saline contrast was given intravenously to evaluate for intracardiac shunting. Agitated saline contrast bubble study was positive with shunting observed within 3-6 cardiac cycles suggestive of interatrial shunt. Additional Comments: 3D was performed not requiring image post processing on an independent workstation and was indeterminate.  LEFT VENTRICLE PLAX 2D LVIDd:         5.80 cm      Diastology LVIDs:         4.60 cm      LV e' medial:    5.11 cm/s LV PW:         0.80 cm  LV E/e' medial:  26.8 LV IVS:        1.10 cm      LV e' lateral:   10.60 cm/s LVOT diam:     2.00 cm      LV E/e' lateral: 12.9 LV SV:         31 LV SV Index:   16 LVOT Area:     3.14 cm LV IVRT:       74 msec  LV Volumes (MOD) LV vol d, MOD A4C: 112.0 ml LV vol s, MOD A4C: 80.2 ml LV SV MOD A4C:     112.0 ml RIGHT VENTRICLE RV Basal diam:  3.60 cm RV Mid diam:    3.10 cm RV S prime:     5.33 cm/s LEFT ATRIUM            Index        RIGHT ATRIUM           Index LA diam:      4.50 cm  2.31 cm/m   RA Area:     15.80 cm LA Vol (A4C):  115.0 ml 58.97 ml/m  RA Volume:   35.40 ml  18.15 ml/m  AORTIC VALVE AV Area (Vmax):    0.78 cm AV Area (Vmean):   0.71 cm AV Area (VTI):     0.84 cm AV Vmax:           239.00 cm/s AV Vmean:          170.667 cm/s AV VTI:            0.372 m AV Peak Grad:      22.8 mmHg AV Mean Grad:      13.7 mmHg LVOT Vmax:         59.10 cm/s LVOT Vmean:        38.700 cm/s LVOT VTI:          0.099 m LVOT/AV VTI ratio: 0.27  AORTA Ao Root diam: 2.90 cm MITRAL VALVE                  TRICUSPID VALVE MV Area (PHT): 3.76 cm       TR Peak grad:   35.3 mmHg MV Area VTI:   0.80 cm       TR Vmax:        297.00 cm/s MV Peak grad:  12.7 mmHg MV Mean grad:  6.0 mmHg       SHUNTS MV Vmax:       1.78 m/s       Systemic VTI:  0.10 m MV Vmean:      109.0 cm/s     Systemic Diam: 2.00 cm MV Decel Time: 202 msec MR Peak grad:    86.9 mmHg MR Mean grad:    48.0 mmHg MR Vmax:         466.00 cm/s MR Vmean:        321.0 cm/s MR PISA:         6.28 cm MR PISA Eff ROA: 44 mm MR PISA Radius:  1.00 cm MV E velocity: 137.00 cm/s MV A velocity: 45.60 cm/s MV E/A ratio:  3.00 Cara JONETTA Lovelace MD Electronically signed by Cara JONETTA Lovelace MD Signature Date/Time: 09/19/2024/1:48:52 PM    Final    US  Carotid Bilateral Result Date: 09/19/2024 EXAM: US  CAROTID DUPLEX 09/19/2024 07:52:19 AM TECHNIQUE: Real-time grayscale, color flow and spectral Doppler sonographic images were obtained of the extracranial carotid system using a  linear transducer. COMPARISON: None available CLINICAL HISTORY: Acute stroke due to ischemia (HCC), hypertension, stroke/TIA, hyperlipidemia, diabetes. FINDINGS: RIGHT: Common carotid artery: 52 cm/s Internal carotid artery: 60 cm/s External carotid artery: 48 cm/s Right ICA/CCA ratio: 1.2 Plaque: Calcified plaque in the carotid bulb resulting in only mild stenosis. Scattered eccentric nonocclusive partially calcified plaque in the right common carotid artery. No focal color aliasing. Waveforms are normal. Vertebral artery: Antegrade  LEFT: Common carotid artery: 53 cm/s Internal carotid artery: 75 cm/s External carotid artery: 59 cm/s LEFT ICA/CCA ratio: 1.4 Plaque: Eccentric partially calcified plaque in the bulb resulting in mild stenosis. Scattered nonocclusive plaque in the common carotid artery. Normal color Doppler signal and waveforms. Vertebral artery: Antegrade IMPRESSION: 1. No hemodynamically significant stenosis (greater than 50%) within the extracranial internal carotid arteries. 2. Mild stenosis in the right carotid bulb from calcified plaque and in the left carotid bulb from eccentric partially calcified plaque. 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 Ragen Laver is a 59 y.o.  female with past medical conditions including anemia, GERD, hypertension, stroke, diabetes, and end-stage renal disease on hemodialysis. Patient presents to the emergency department for altered mental status.SABRA   [facility]/MWF/Rt permcath  End-stage renal disease on hemodialysis.  Receiving dialysis today, UF goal 1-1.5L as tolerated. Next treatment scheduled for Friday.   2.  Acute ischemic CVA, confirmed on CT and MRI brain.  Neurology consulted..  Statin initiation along with PT/OT, and neuro checks. ECHO from 1/20 shows EF 35-40%.   3. Anemia of chronic kidney disease Lab Results  Component Value Date   HGB 9.4 (L) 09/20/2024    Will hold ESA's  due to recent stroke.  4. Secondary Hyperparathyroidism: with outpatient labs: None available currently  Lab Results  Component Value Date   CALCIUM  9.6 09/20/2024   CAION 1.00 (L) 03/09/2024   PHOS 4.5 09/18/2024    Calcium  and phos stable   LOS: 2 [name] 1/21/202610:24 AM   "

## 2024-09-21 LAB — CBC
HCT: 34.9 % — ABNORMAL LOW (ref 36.0–46.0)
Hemoglobin: 10.7 g/dL — ABNORMAL LOW (ref 12.0–15.0)
MCH: 30.7 pg (ref 26.0–34.0)
MCHC: 30.7 g/dL (ref 30.0–36.0)
MCV: 100.3 fL — ABNORMAL HIGH (ref 80.0–100.0)
Platelets: 237 K/uL (ref 150–400)
RBC: 3.48 MIL/uL — ABNORMAL LOW (ref 3.87–5.11)
RDW: 17.2 % — ABNORMAL HIGH (ref 11.5–15.5)
WBC: 7 K/uL (ref 4.0–10.5)
nRBC: 0 % (ref 0.0–0.2)

## 2024-09-21 LAB — GLUCOSE, CAPILLARY
Glucose-Capillary: 114 mg/dL — ABNORMAL HIGH (ref 70–99)
Glucose-Capillary: 128 mg/dL — ABNORMAL HIGH (ref 70–99)
Glucose-Capillary: 129 mg/dL — ABNORMAL HIGH (ref 70–99)
Glucose-Capillary: 150 mg/dL — ABNORMAL HIGH (ref 70–99)
Glucose-Capillary: 193 mg/dL — ABNORMAL HIGH (ref 70–99)

## 2024-09-21 LAB — BASIC METABOLIC PANEL WITH GFR
Anion gap: 30 — ABNORMAL HIGH (ref 5–15)
BUN: 21 mg/dL — ABNORMAL HIGH (ref 6–20)
CO2: 19 mmol/L — ABNORMAL LOW (ref 22–32)
Calcium: 9.8 mg/dL (ref 8.9–10.3)
Chloride: 90 mmol/L — ABNORMAL LOW (ref 98–111)
Creatinine, Ser: 3.72 mg/dL — ABNORMAL HIGH (ref 0.44–1.00)
GFR, Estimated: 13 mL/min — ABNORMAL LOW
Glucose, Bld: 127 mg/dL — ABNORMAL HIGH (ref 70–99)
Potassium: 4.4 mmol/L (ref 3.5–5.1)
Sodium: 138 mmol/L (ref 135–145)

## 2024-09-21 LAB — VITAMIN B6: Vitamin B6: 3.3 ug/L — ABNORMAL LOW (ref 3.4–65.2)

## 2024-09-21 MED ORDER — HYDROCORTISONE ACETATE 25 MG RE SUPP: 25.0000 mg | Freq: Two times a day (BID) | RECTAL | Status: AC

## 2024-09-21 MED ORDER — OXYCODONE HCL 5 MG PO TABS: 5.0000 mg | ORAL_TABLET | Freq: Two times a day (BID) | ORAL | 0 refills | Status: AC | PRN

## 2024-09-21 MED ORDER — ASPIRIN 81 MG PO TBEC: 81.0000 mg | DELAYED_RELEASE_TABLET | Freq: Every day | ORAL | Status: AC

## 2024-09-21 MED ORDER — MIDODRINE HCL 5 MG PO TABS: 5.0000 mg | ORAL_TABLET | Freq: Three times a day (TID) | ORAL | Status: AC

## 2024-09-21 NOTE — TOC Progression Note (Signed)
 Transition of Care [facility]) - Progression Note    Patient Details  Name: [name] MRN: [id number] Date of Birth: 07-14-1966  Transition of [facility]) CM/SW Contact  Nathanael CHRISTELLA Ring, RN Phone Number: 09/21/2024, 9:30 AM  Clinical Narrative:    CM called and spoke with patient's husband, he says that he is currently at work and gets off at 5.  He has changed his mind about the facility he would like for her to go to.  He would like for her to go to [facility], he still wants her to get dialysis at her regular center and make sure that she gets transport to her would appointment weekly.  Called and left [facility] a message for return call.    Expected Discharge Plan: Skilled Nursing Facility Barriers to Discharge: Continued Medical Work up               Expected Discharge Plan and Services   Discharge Planning Services: CM Consult   Living arrangements for the past 2 months: Single Family Home                                       Social Drivers of Health (SDOH) Interventions SDOH Screenings   Food Insecurity: No Food Insecurity (09/17/2024)  Housing: Low Risk (09/17/2024)  Transportation Needs: No Transportation Needs (09/17/2024)  Utilities: Not At Risk (09/17/2024)  Depression (PHQ2-9): Low Risk (09/07/2024)  Recent Concern: Depression (PHQ2-9) - Medium Risk (07/03/2024)  Financial Resource Strain: Low Risk (07/25/2024)  Physical Activity: Inactive (07/25/2024)  Social Connections: Moderately Isolated (09/17/2024)  Tobacco Use: Low Risk (09/17/2024)    Readmission Risk Interventions    03/03/2024    3:31 PM  Readmission Risk Prevention Plan  Transportation Screening Complete  PCP or Specialist Appt within 5-7 Days --  Home Care Screening Complete  Medication Review (RN CM) Complete

## 2024-09-21 NOTE — Care Management Important Message (Signed)
 Important Message  Patient Details  Name: [name] MRN: [id number] Date of Birth: 23-Aug-1966   Important Message Given:  Yes - Medicare IM     Cori Henningsen W, CMA 09/21/2024, 12:50 PM

## 2024-09-21 NOTE — Progress Notes (Signed)
 " [facility]  ROUNDING NOTE   Subjective:   [name] s a 59 year old female with past medical conditions including anemia, GERD, hypertension, stroke, diabetes, and end-stage renal disease on hemodialysis. Patient presents to the emergency department for evaluation of altered mental status. She has been admitted for Acute ischemic stroke [facility]) [I63.9] Expressive aphasia [R47.01] Cerebrovascular accident (CVA), unspecified mechanism (HCC) [I63.9]  Patient is known to our practice and receives outpatient dialysis treatments at [facility] MWF schedule, supervised by Dr. Marcelino.   Update: Patient sitting up in bed No family present [place] remains present   Objective:  Vital signs in last 24 hours:  Temp:  [97.6 F (36.4 C)-98.3 F (36.8 C)] 97.6 F (36.4 C) (01/22 0715) Pulse Rate:  [85-89] 85 (01/22 0715) Resp:  [14-18] 16 (01/22 0315) BP: (91-109)/(61-80) 103/71 (01/22 0715) SpO2:  [97 %-100 %] 100 % (01/22 0715) Weight:  [79.9 kg] 79.9 kg (01/21 1330)  Weight change:  Filed Weights   09/18/24 1748 09/20/24 0925 09/20/24 1330  Weight: 83.3 kg 81.4 kg 79.9 kg    Intake/Output: I/O last 3 completed shifts: In: 240 [P.O.:240] Out: 2250 [Urine:750; Other:1500]   Intake/Output this shift:  No intake/output data recorded.  Physical Exam: General: NAD  Head: Normocephalic, atraumatic. Moist oral mucosal membranes  Eyes: Anicteric  Lungs:  Clear to auscultation  Heart: Regular rate and rhythm, paced  Abdomen:  Soft, nontender  Extremities:  No peripheral edema.  Neurologic: Awake, oriented   Skin: BLE ace bandages  Access: Rt permcath    Basic Metabolic Panel: Recent Labs  Lab 09/17/24 1800 09/18/24 0558 09/19/24 0502 09/20/24 0515 09/21/24 0533  NA 139 138 135 136 138  K 5.3* 5.3* 4.7 5.0 4.4  CL 90* 91* 91* 93* 90*  CO2 22 19* 22 20* 19*  GLUCOSE 200* 153* 158* 138* 127*  BUN 44* 47* 27* 36* 21*  CREATININE 5.20* 5.58* 4.05*  5.31* 3.72*  CALCIUM  10.4* 10.2 9.7 9.6 9.8  MG  --  2.2  --   --   --   PHOS  --  4.5  --   --   --     Liver Function Tests: Recent Labs  Lab 09/17/24 1800 09/18/24 0558  AST <10* <10*  ALT <5 <5  ALKPHOS 179* 158*  BILITOT 0.5 0.6  PROT 6.7 6.1*  ALBUMIN  3.9 3.6   No results for input(s): LIPASE, AMYLASE in the last 168 hours. No results for input(s): AMMONIA in the last 168 hours.  CBC: Recent Labs  Lab 09/17/24 1800 09/18/24 0558 09/19/24 0502 09/20/24 0515 09/21/24 0533  WBC 7.4 7.0 7.1 6.4 7.0  NEUTROABS 6.2  --   --   --   --   HGB 9.9* 9.3* 9.3* 9.4* 10.7*  HCT 32.5* 31.3* 31.5* 31.5* 34.9*  MCV 101.2* 101.3* 103.6* 101.0* 100.3*  PLT 275 252 249 255 237    Cardiac Enzymes: No results for input(s): CKTOTAL, CKMB, CKMBINDEX, TROPONINI in the last 168 hours.  BNP: Invalid input(s): POCBNP  CBG: Recent Labs  Lab 09/20/24 1618 09/20/24 1946 09/21/24 0016 09/21/24 0323 09/21/24 0731  GLUCAP 164* 186* 114* 128* 129*    Microbiology: Results for orders placed or performed during the hospital encounter of 08/30/24  Blood Culture (routine x 2)     Status: None   Collection Time: 08/30/24  5:21 PM   Specimen: BLOOD  Result Value Ref Range Status   Specimen Description BLOOD BLOOD RIGHT HAND  Final   Special Requests   Final    BOTTLES DRAWN AEROBIC AND ANAEROBIC Blood Culture results may not be optimal due to an inadequate volume of blood received in culture bottles   Culture   Final    NO GROWTH 5 DAYS Performed at [facility], [address] [place], [place] [address]    Report Status 09/04/2024 FINAL  Final  Blood Culture (routine x 2)     Status: None   Collection Time: 08/30/24  5:25 PM   Specimen: BLOOD  Result Value Ref Range Status   Specimen Description BLOOD RIGHT ANTECUBITAL  Final   Special Requests   Final    BOTTLES DRAWN AEROBIC AND ANAEROBIC Blood Culture adequate volume   Culture   Final    NO  GROWTH 5 DAYS Performed at [facility], [address], [place], [place] [address]    Report Status 09/04/2024 FINAL  Final  Aerobic Culture w Gram Stain (superficial specimen)     Status: None   Collection Time: 08/31/24  1:14 AM   Specimen: Leg; Wound  Result Value Ref Range Status   Specimen Description   Final    LEG Performed at [facility], [address], [place], [place] [address]    Special Requests   Final    NONE Performed at [facility], [address] [place], [place] [address]    Gram Stain   Final    NO WBC SEEN FEW GRAM NEGATIVE RODS RARE GRAM POSITIVE RODS RARE GRAM POSITIVE COCCI    Culture   Final    ABUNDANT PSEUDOMONAS ALCALIGENES FEW ENTEROCOCCUS FAECALIS ABUNDANT STENOTROPHOMONAS MALTOPHILIA Sent to [facility] for further susceptibility testing. SEE SEPARATE REPORT Performed at [facility], [address], [place], [place] [address]    Report Status 09/10/2024 FINAL  Final   Organism ID, Bacteria ENTEROCOCCUS FAECALIS  Final   Organism ID, Bacteria PSEUDOMONAS ALCALIGENES  Final   Organism ID, Bacteria PSEUDOMONAS ALCALIGENES  Final      Susceptibility   Enterococcus faecalis - MIC*    AMPICILLIN <=2 SENSITIVE Sensitive     VANCOMYCIN  1 SENSITIVE Sensitive     GENTAMICIN SYNERGY SENSITIVE Sensitive     * FEW ENTEROCOCCUS FAECALIS   Pseudomonas alcaligenes - MIC*    MEROPENEM 8 INTERMEDIATE Intermediate     CIPROFLOXACIN 0.5 SENSITIVE Sensitive     PIP/TAZO Value in next row Intermediate      32 INTERMEDIATEThis is a modified FDA-approved test that has been validated and its performance characteristics determined by the reporting laboratory.  This laboratory is certified under the Clinical Laboratory Improvement Amendments CLIA as qualified to perform high complexity clinical laboratory testing.    CEFEPIME  Value in next row Sensitive      32 INTERMEDIATEThis is a modified FDA-approved test that has been  validated and its performance characteristics determined by the reporting laboratory.  This laboratory is certified under the Clinical Laboratory Improvement Amendments CLIA as qualified to perform high complexity clinical laboratory testing.   Pseudomonas alcaligenes - MIC*    CEFOTAXIME (Non-meningitis) Value in next row Intermediate      32 INTERMEDIATEThis is a modified FDA-approved test that has been validated and its performance characteristics determined by the reporting laboratory.  This laboratory is certified under the Clinical Laboratory Improvement Amendments CLIA as qualified to perform high complexity clinical laboratory testing.    * ABUNDANT PSEUDOMONAS ALCALIGENES    ABUNDANT PSEUDOMONAS ALCALIGENES  Susceptibility, Aer +  Anaerob     Status: Abnormal   Collection Time: 08/31/24  1:14 AM  Result Value Ref Range Status   Suscept, Aer + Anaerob Final report (A)  Corrected    Comment: (NOTE) Performed At: [facility] [address] [place], [place] [id number] Jennette Shorter MD Ey:[id number] CORRECTED ON 01/11 AT 0736: PREVIOUSLY REPORTED AS Preliminary report    Source PENDING  Incomplete  Susceptibility Result     Status: Abnormal   Collection Time: 08/31/24  1:14 AM  Result Value Ref Range Status   Suscept Result 1 Comment (A)  Final    Comment: (NOTE) Stenotrophomonas maltophilia Identification performed by account, not confirmed by this laboratory. Levofloxacin and Trimethoprim-Sulfamethoxazole should not be used alone for antimicrobial therapy (CLSI).    Antimicrobial Suscept Comment  Corrected    Comment: (NOTE)      ** S = Susceptible; I = Intermediate; R = Resistant **                   P = Positive; N = Negative            MICS are expressed in micrograms per mL   Antibiotic                 RSLT#1    RSLT#2    RSLT#3    RSLT#4 Levofloxacin                   S Minocycline                    S Trimethoprim/Sulfa             S Performed At: [facility] [employer] [address] [place], [place] [id number] Jennette Shorter MD Ey:[id number]     Coagulation Studies: No results for input(s): LABPROT, INR in the last 72 hours.   Urinalysis: No results for input(s): COLORURINE, LABSPEC, PHURINE, GLUCOSEU, HGBUR, BILIRUBINUR, KETONESUR, PROTEINUR, UROBILINOGEN, NITRITE, LEUKOCYTESUR in the last 72 hours.  Invalid input(s): APPERANCEUR    Imaging: US  Venous Img Lower Bilateral (DVT) Result Date: 09/20/2024 CLINICAL DATA:  Edema EXAM: Bilateral Lower Extremity Venous Doppler Ultrasound TECHNIQUE: Gray-scale sonography with compression, as well as color and duplex ultrasound, were performed to evaluate the deep venous system(s) from the level of the common femoral vein through the popliteal and proximal calf veins. COMPARISON:  05/01/2024 FINDINGS: VENOUS Normal compressibility of the common femoral, femoral, and popliteal veins, as well as the visualized calf veins. Visualized portions of profunda femoral vein and great saphenous vein unremarkable. No filling defects to suggest DVT on grayscale or color Doppler imaging. Doppler waveforms show normal direction of venous flow, normal respiratory plasticity and response to augmentation. OTHER None. Limitations: Limited visualization of the calf veins bilaterally. IMPRESSION: No lower extremity DVT. Electronically Signed   By: Aliene Lloyd M.D.   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  female with past medical conditions including anemia, GERD, hypertension, stroke, diabetes, and end-stage renal disease on hemodialysis. Patient presents to the emergency department for altered mental status.SABRA   [facility]/MWF/Rt permcath  End-stage renal disease on hemodialysis.  Received dialysis yesterday, UF 1.5L as tolerated. Next treatment scheduled for Friday. Patient scheduled to discharge to [facility] today.  2.  Acute ischemic CVA, confirmed on CT and MRI brain.  Neurology consulted..  Statin initiation along with PT/OT, and neuro checks. ECHO from 1/20 shows EF 35-40%.   3. Anemia of chronic kidney disease Lab Results  Component Value Date   HGB 10.7 (L) 09/21/2024    Hgb within desired range.   4. Secondary Hyperparathyroidism: with outpatient labs: None available currently  Lab Results  Component Value Date   CALCIUM  9.8 09/21/2024   CAION 1.00 (L) 03/09/2024   PHOS 4.5 09/18/2024    Will continue to monitor.   LOS: 3 Arilla Hice 1/22/202611:34 AM   "

## 2024-09-21 NOTE — Progress Notes (Addendum)
 D/C order noted. Contacted [facility] to be advised of pt and d/c today. Requested documents faxed to clinic for continuation of care.  Suzen Satchel Dialysis Navigator [phone]

## 2024-09-21 NOTE — Progress Notes (Signed)
 The patient refused to take her 0600am Heparin  injection. This clinical research associate educated the patient on the importance of taking the heparin . This clinical research associate notified Rockie Rams, NP.

## 2024-09-21 NOTE — TOC Transition Note (Signed)
 Transition of Care [facility]) - Discharge Note   Patient Details  Name: [name] MRN: [id number] Date of Birth: July 23, 1966  [facility] CM/SW Contact:  Nathanael CHRISTELLA Ring, RN Phone Number: 09/21/2024, 12:12 PM   Clinical Narrative:     Patient is medically cleared for discharge to [facility] today, husband updated and he said he can transport.  Patient will be going to room E 14, bedside nurse will call report to  [phone].   Verified that patient will receive her dialysis there at [facility] and speech therapy, they also say they can transport her to her wound appointments weekly.     Final next level of care: Skilled Nursing Facility Barriers to Discharge: Barriers Resolved   Patient Goals and CMS Choice   CMS Medicare.gov Compare Post Acute Care list provided to:: Patient Represenative (must comment) Choice offered to / list presented to : Spouse      Discharge Placement              Patient chooses bed at: [facility] Patient to be transferred to facility by: Husband Name of family member notified: [place] Patient and family notified of of transfer: 09/21/24  Discharge Plan and Services Additional resources added to the After Visit Summary for     Discharge Planning Services: CM Consult            DME Arranged: N/A DME Agency: NA       HH Arranged: NA          Social Drivers of Health (SDOH) Interventions SDOH Screenings   Food Insecurity: No Food Insecurity (09/17/2024)  Housing: Low Risk (09/17/2024)  Transportation Needs: No Transportation Needs (09/17/2024)  Utilities: Not At Risk (09/17/2024)  Depression (PHQ2-9): Low Risk (09/07/2024)  Recent Concern: Depression (PHQ2-9) - Medium Risk (07/03/2024)  Financial Resource Strain: Low Risk (07/25/2024)  Physical Activity: Inactive (07/25/2024)  Social Connections: Moderately Isolated (09/17/2024)  Tobacco Use: Low Risk (09/17/2024)     Readmission Risk Interventions    03/03/2024     3:31 PM  Readmission Risk Prevention Plan  Transportation Screening Complete  PCP or Specialist Appt within 5-7 Days --  Home Care Screening Complete  Medication Review (RN CM) Complete

## 2024-09-21 NOTE — Progress Notes (Signed)
 Report called to [phone] and given to [facility]. Patient will be going to room E 14.

## 2024-09-21 NOTE — Discharge Summary (Signed)
 " Physician Discharge Summary   Patient: [name] MRN: [id number] DOB: April 05, 1966  Admit date:     09/17/2024  Discharge date: 09/21/24  Discharge Physician: Murvin Mana   PCP: Franchot Isaiah LABOR, MD   Recommendations at discharge:    Follow-up with PCP in 1 week. Follow-up with cardiology as scheduled. Continue hemodialysis. Refer to palliative care. Follow-up with neurology in 1 month.  Discharge Diagnoses: Principal Problem:   Acute ischemic stroke [facility]) Active Problems:   ESRD on dialysis (HCC)   Calciphylaxis   Chronic systolic CHF (congestive heart failure) (HCC)   PFO (patent foramen ovale)   Severe aortic stenosis  Resolved Problems:   * No resolved hospital problems. *  Hospital Course: [name] is a 59 y.o. 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 MRI of the brain shows large left MCA stroke.  Patient is seen by neurology, treated with aspirin . Patient will be transferred to nursing home for rehab.  Assessment and Plan:  # Acute ischemic stroke MRI brain: 1. Large area of acute ischemia affecting the posterior left MCA territory. 2. Multiple punctate foci of acute ischemia within the centrum semiovale bilaterally. 3. 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 Continue fall precautions and neuro checks Lipid panel LDL 32, very low <70 goal.  Per neurology, no need for statin due to lower LDL. Carotid ultrasound did not show any significant occlusion. TTE LVEF 35 to 40%, global hypokinesis, LV moderate to severely dilated, grade 3 DD, RV volume overload, D-shaped ventricle.  Moderate to severe MR, severe TR, moderate to severe AS, positive PFO. Seen by neurology, recommended to continue aspirin  81 mg p.o. daily, d/c'd statin and no need of DAPT  at this time.   Chronic systolic congestive heart failure with ejection fraction 30 to 35%. Severe aortic stenosis. PFO. Moderate to severe mitral rotation. Severe tricuspid regurgitation Patient currently does not have any evidence of exacerbation congestive heart failure.  However, patient has severe abnormality in multiple heart valves.  Particularly aortic stenosis, reported as moderate to severe, give ejection fraction 30 to 35%, stenosis would be severe. Patient also has severe TR and moderate to severe MR. Patient has been seen by cardiology, did not recommend aortic valve replacement.  Given patient current condition, prognosis is poor.  Ejection fraction will continue to deteriorate with untreated valvular disease. Patient will be followed by cardiology in 2 weeks.  However, if no plan for intervention of severe aortic stenosis, condition can deteriorate very quickly.  Life expectancy probably less than a year.  Palliative care will be referred to.   # Hyperkalemia End-stage renal disease on dialysis. Metabolic acidosis. Calciphylaxis/ Hypercalcemia possibly due to ESRD Patient followed by nephrology for dialysis. .Sodium thiosulfate  IV every MWF with hemodialysis and wound care.   S/p leadless pacemaker Micra AV 2 Prolonged QT interval Follow-up with  cardiology.   Elevated troponin probably secondary to type II demand ischemia Troponin 114 > 106, troponin has flattened.  She denies chest pain.   Type 2 diabetes mellitus with hyperglycemia Hemoglobin A1c on 07/25/2024 was 6.5 Follow-up with PCP as outpatient.   Essential hypertension (controlled) Orthostatic hypotension. No antihypertensive medication noted on med rec Blood pressure seem to be better, reduce the dose of midodrine .   Mixed hyperlipidemia Statin not started with a lower LDL.  Rectal bleeding due to internal hemorrhoids. Patient has a history of internal hemorrhoids and rectal bleeding.  Had episode of  rectal bleeding yesterday, no additional bleeding.  Started on steroids per rectum.  Hemoglobin is better today.  Follow with PCP as outpatient.         Consultants: Cardiology, nephrology. Procedures performed: Hemodialysis Disposition: Skilled nursing facility Diet recommendation:  Discharge Diet Orders (From admission, onward)     Start     Ordered   09/21/24 0000  Diet renal with fluid restriction        09/21/24 1018           Renal diet DISCHARGE MEDICATION: Allergies as of 09/21/2024       Reactions   Enalapril Hives, Other (See Comments)   Angioedema face.   Ivp Dye [iodinated Contrast Media] Hives   Lisinopril Shortness Of Breath   Prednisone  Shortness Of Breath        Medication List     STOP taking these medications    NovoLOG  FlexPen 100 UNIT/ML FlexPen Generic drug: insulin  aspart   Tenapanor HCl (CKD) 30 MG Tabs       TAKE these medications    albuterol  108 (90 Base) MCG/ACT inhaler Commonly known as: VENTOLIN  HFA Inhale 2 puffs into the lungs every 6 (six) hours as needed for wheezing or shortness of breath.   aspirin  EC 81 MG tablet Take 1 tablet (81 mg total) by mouth daily. Swallow whole. Start taking on: September 22, 2024   Dexcom G7 Sensor Misc Use to check blood sugar as needed for insulin  dependent type 2 diabetes What changed: Another medication with the same name was removed. Continue taking this medication, and follow the directions you see here.   Dupilumab 300 MG/2ML Soaj Inject 300 mg into the skin.   gabapentin  100 MG capsule Commonly known as: NEURONTIN  Take 1 capsule (100 mg total) by mouth at bedtime.   hydrocortisone  25 MG suppository Commonly known as: ANUSOL -HC Place 1 suppository (25 mg total) rectally 2 (two) times daily.   ipratropium-albuterol  0.5-2.5 (3) MG/3ML Soln Commonly known as: DUONEB Take 3 mLs by nebulization 4 (four) times daily. What changed:  when to take this reasons to take  this additional instructions   midodrine  5 MG tablet Commonly known as: PROAMATINE  Take 1 tablet (5 mg total) by mouth 3 (three) times daily with meals.   naloxone  4 MG/0.1ML Liqd nasal spray kit Commonly known as: NARCAN  Place 1 spray into the nose as needed for up to 365 doses (for opioid-induced respiratory depresssion). In case of emergency (overdose), spray once into each nostril. If no response within 3 minutes, repeat application and call 911.   oxyCODONE  5 MG immediate release tablet Commonly known as: Oxy IR/ROXICODONE  Take 1 tablet (5 mg total) by mouth every 12 (twelve) hours as needed for severe pain (pain score 7-10). Must last 30 days. Start taking on: November 09, 2024 What changed:  These instructions start on November 09, 2024. If you are unsure what to do until then, ask your doctor or other care provider. Another medication with the same name was removed. Continue taking this medication, and follow the directions you see here.   pentoxifylline  400 MG CR tablet Commonly known as: TRENTAL  Take 1 tablet (400 mg total) by mouth daily.   RENA-VITE PO Take 1 tablet by mouth daily.   sevelamer  carbonate 800 MG tablet Commonly known as: RENVELA  Take 2,400 mg by mouth 3 (three) times daily with meals.  SODIUM THIOSULFATE  IV Inject 12.5 mg into the vein 3 (three) times a week.               Discharge Care Instructions  (From admission, onward)           Start     Ordered   09/21/24 0000  Discharge wound care:       Comments: 09/19/24 0500    Wound care  Daily      Comments: Cleanse all foot/toe and leg wounds with Vashe, do not rinse.  Apply Xeroform gauze ([place] [phone]) to wound beds daily, cover with dry gauze/ABD pads depending on location.  Wrap with Kerlix roll gauze beginning at toe wounds in a spiral fashion to right below knees.  Apply Coban or Ace bandage wrapped in same fashion as Kerlix for light compression.  Perform this wound care daily.  09/18/24  0625   09/18/24 1100    Wound care  Once      Comments: Cut off K2 Urgo lite compression wrap.  Cleanse all foot/toe and leg wounds with Vashe, do not rinse.  Apply Xeroform gauze ([place]son [phone]) to wound beds daily, cover with dry gauze/ABD pads depending on location.  Wrap with Kerlix roll gauze beginning at toe wounds in a spiral fashion to right below knees.  Apply Coban or Ace bandage wrapped in same fashion as Kerlix for light compression.   09/21/24 1018            Contact information for follow-up providers     Franchot Isaiah LABOR, MD Follow up in 1 week(s).   Specialty: Family Medicine Why: hospital follow up Contact information: [address] [place] [address] [phone]         Wilburn Keller BROCKS, MD. Go in 1 week(s).   Specialty: Cardiology Contact information: [address] [place] [address] [phone]         [facility] Follow up in 1 month(s).   Contact information: [address] [place]  [address] [phone]             Contact information for after-discharge care     Destination     [facility] .   Service: Skilled Nursing Contact information: [address] [place]  [address] [phone]                    Discharge Exam: Filed Weights   09/18/24 1748 09/20/24 0925 09/20/24 1330  Weight: 83.3 kg 81.4 kg 79.9 kg   General exam: Appears calm and comfortable  Respiratory system: Clear to auscultation. Respiratory effort normal. Cardiovascular system: S1 & S2 heard, RRR. No JVD, murmurs, rubs, gallops or clicks. No pedal edema. Gastrointestinal system: Abdomen is nondistended, soft and nontender. No organomegaly or masses felt. Normal bowel sounds heard. Central nervous system: Alert and oriented x3. No focal neurological deficits. Extremities: Symmetric 5 x 5 power. Skin: No rashes, lesions or  ulcers Psychiatry: Judgement and insight appear normal. Mood & affect appropriate.    Condition at discharge: fair  The results of significant diagnostics from this hospitalization (including imaging, microbiology, ancillary and laboratory) are listed below for reference.   Imaging Studies: US  Venous Img Lower Bilateral (DVT) Result Date: 09/20/2024 CLINICAL DATA:  Edema EXAM: Bilateral Lower Extremity Venous Doppler Ultrasound TECHNIQUE: Gray-scale sonography with compression, as well as color and duplex ultrasound, were performed to evaluate the deep venous system(s) from the level  of the common femoral vein through the popliteal and proximal calf veins. COMPARISON:  05/01/2024 FINDINGS: VENOUS Normal compressibility of the common femoral, femoral, and popliteal veins, as well as the visualized calf veins. Visualized portions of profunda femoral vein and great saphenous vein unremarkable. No filling defects to suggest DVT on grayscale or color Doppler imaging. Doppler waveforms show normal direction of venous flow, normal respiratory plasticity and response to augmentation. OTHER None. Limitations: Limited visualization of the calf veins bilaterally. IMPRESSION: No lower extremity DVT. Electronically Signed   By: Aliene Lloyd M.D.   On: 09/20/2024 17:44   ECHOCARDIOGRAM COMPLETE Result Date: 09/19/2024    ECHOCARDIOGRAM REPORT   Patient Name:   [name] Date of Exam: 09/19/2024 Medical Rec #:  [id number]    Height:       67.0 in Accession #:    [id number]   Weight:       183.6 lb Date of Birth:  04-01-1966    BSA:          1.950 m Patient Age:    58 years     BP:           90/59 mmHg Patient Gender: F            HR:           85 bpm. Exam Location:  [facility] Procedure: 2D Echo, Cardiac Doppler, Color Doppler and Saline Contrast Bubble            Study (Both Spectral and Color Flow Doppler were utilized during            procedure). Indications:     Stroke I63.9  History:         Patient has prior  history of Echocardiogram examinations, most                  recent 05/03/2024. Signs/Symptoms:Murmur; Risk Factors:Diabetes                  and Hypertension.  Sonographer:     Christopher Furnace Referring Phys:  [id number] OLADAPO ADEFESO Diagnosing Phys: Cara JONETTA Lovelace MD  Sonographer Comments: Suboptimal apical window. IMPRESSIONS  1. Left ventricular ejection fraction, by estimation, is 35 to 40%. The left ventricle has moderately decreased function. The left ventricle demonstrates global hypokinesis. The left ventricular internal cavity size was moderately to severely dilated. Left ventricular diastolic parameters are consistent with Grade III diastolic dysfunction (restrictive). There is the interventricular septum is flattened in diastole ('D' shaped left ventricle), consistent with right ventricular volume overload.  2. Right ventricular systolic function is mildly reduced. The right ventricular size is mildly enlarged.  3. Left atrial size was moderately dilated.  4. Right atrial size was mildly dilated.  5. The mitral valve is abnormal. Moderate to severe mitral valve regurgitation. Moderate mitral annular calcification.  6. Tricuspid valve regurgitation is severe.  7. The aortic valve is calcified. There is moderate calcification of the aortic valve. There is moderate thickening of the aortic valve. Aortic valve regurgitation is mild. Moderate to severe aortic valve stenosis.  8. Agitated saline contrast bubble study was positive with shunting observed within 3-6 cardiac cycles suggestive of interatrial shunt. FINDINGS  Left Ventricle: Left ventricular ejection fraction, by estimation, is 35 to 40%. The left ventricle has moderately decreased function. The left ventricle demonstrates global hypokinesis. Strain was performed and the global longitudinal strain is indeterminate. The left ventricular internal cavity size was moderately to severely dilated. There is borderline  asymmetric left ventricular hypertrophy.  The interventricular septum is flattened in diastole ('D' shaped left ventricle), consistent with right ventricular volume overload. Left ventricular diastolic parameters are consistent with Grade III diastolic dysfunction (restrictive). Right Ventricle: The right ventricular size is mildly enlarged. No increase in right ventricular wall thickness. Right ventricular systolic function is mildly reduced. Left Atrium: Left atrial size was moderately dilated. Right Atrium: Right atrial size was mildly dilated. Pericardium: There is no evidence of pericardial effusion. Mitral Valve: The mitral valve is abnormal. There is moderate thickening of the mitral valve leaflet(s). There is moderate calcification of the mitral valve leaflet(s). Moderate mitral annular calcification. Moderate to severe mitral valve regurgitation.  MV peak gradient, 12.7 mmHg. The mean mitral valve gradient is 6.0 mmHg. Tricuspid Valve: The tricuspid valve is grossly normal. Tricuspid valve regurgitation is severe. Aortic Valve: The aortic valve is calcified. There is moderate calcification of the aortic valve. There is moderate thickening of the aortic valve. There is moderate aortic valve annular calcification. Aortic valve regurgitation is mild. Moderate to severe aortic stenosis is present. Aortic valve mean gradient measures 13.7 mmHg. Aortic valve peak gradient measures 22.8 mmHg. Aortic valve area, by VTI measures 0.84 cm. Pulmonic Valve: The pulmonic valve was not well visualized. Pulmonic valve regurgitation is not visualized. Aorta: The aortic root was not well visualized. IAS/Shunts: The interatrial septum was not well visualized. Agitated saline contrast was given intravenously to evaluate for intracardiac shunting. Agitated saline contrast bubble study was positive with shunting observed within 3-6 cardiac cycles suggestive of interatrial shunt. Additional Comments: 3D was performed not requiring image post processing on an independent  workstation and was indeterminate.  LEFT VENTRICLE PLAX 2D LVIDd:         5.80 cm      Diastology LVIDs:         4.60 cm      LV e' medial:    5.11 cm/s LV PW:         0.80 cm      LV E/e' medial:  26.8 LV IVS:        1.10 cm      LV e' lateral:   10.60 cm/s LVOT diam:     2.00 cm      LV E/e' lateral: 12.9 LV SV:         31 LV SV Index:   16 LVOT Area:     3.14 cm LV IVRT:       74 msec  LV Volumes (MOD) LV vol d, MOD A4C: 112.0 ml LV vol s, MOD A4C: 80.2 ml LV SV MOD A4C:     112.0 ml RIGHT VENTRICLE RV Basal diam:  3.60 cm RV Mid diam:    3.10 cm RV S prime:     5.33 cm/s LEFT ATRIUM            Index        RIGHT ATRIUM           Index LA diam:      4.50 cm  2.31 cm/m   RA Area:     15.80 cm LA Vol (A4C): 115.0 ml 58.97 ml/m  RA Volume:   35.40 ml  18.15 ml/m  AORTIC VALVE AV Area (Vmax):    0.78 cm AV Area (Vmean):   0.71 cm AV Area (VTI):     0.84 cm AV Vmax:           239.00 cm/s AV Vmean:  170.667 cm/s AV VTI:            0.372 m AV Peak Grad:      22.8 mmHg AV Mean Grad:      13.7 mmHg LVOT Vmax:         59.10 cm/s LVOT Vmean:        38.700 cm/s LVOT VTI:          0.099 m LVOT/AV VTI ratio: 0.27  AORTA Ao Root diam: 2.90 cm MITRAL VALVE                  TRICUSPID VALVE MV Area (PHT): 3.76 cm       TR Peak grad:   35.3 mmHg MV Area VTI:   0.80 cm       TR Vmax:        297.00 cm/s MV Peak grad:  12.7 mmHg MV Mean grad:  6.0 mmHg       SHUNTS MV Vmax:       1.78 m/s       Systemic VTI:  0.10 m MV Vmean:      109.0 cm/s     Systemic Diam: 2.00 cm MV Decel Time: 202 msec MR Peak grad:    86.9 mmHg MR Mean grad:    48.0 mmHg MR Vmax:         466.00 cm/s MR Vmean:        321.0 cm/s MR PISA:         6.28 cm MR PISA Eff ROA: 44 mm MR PISA Radius:  1.00 cm MV E velocity: 137.00 cm/s MV A velocity: 45.60 cm/s MV E/A ratio:  3.00 Cara JONETTA Lovelace MD Electronically signed by Cara JONETTA Lovelace MD Signature Date/Time: 09/19/2024/1:48:52 PM    Final    US  Carotid Bilateral Result Date: 09/19/2024 EXAM:  US  CAROTID DUPLEX 09/19/2024 07:52:19 AM TECHNIQUE: Real-time grayscale, color flow and spectral Doppler sonographic images were obtained of the extracranial carotid system using a linear transducer. COMPARISON: None available CLINICAL HISTORY: Acute stroke due to ischemia (HCC), hypertension, stroke/TIA, hyperlipidemia, diabetes. FINDINGS: RIGHT: Common carotid artery: 52 cm/s Internal carotid artery: 60 cm/s External carotid artery: 48 cm/s Right ICA/CCA ratio: 1.2 Plaque: Calcified plaque in the carotid bulb resulting in only mild stenosis. Scattered eccentric nonocclusive partially calcified plaque in the right common carotid artery. No focal color aliasing. Waveforms are normal. Vertebral artery: Antegrade LEFT: Common carotid artery: 53 cm/s Internal carotid artery: 75 cm/s External carotid artery: 59 cm/s LEFT ICA/CCA ratio: 1.4 Plaque: Eccentric partially calcified plaque in the bulb resulting in mild stenosis. Scattered nonocclusive plaque in the common carotid artery. Normal color Doppler signal and waveforms. Vertebral artery: Antegrade IMPRESSION: 1. No hemodynamically significant stenosis (greater than 50%) within the extracranial internal carotid arteries. 2. Mild stenosis in the right carotid bulb from calcified plaque and in the left carotid bulb from eccentric partially calcified plaque. Electronically signed by: Katheleen Faes MD 09/19/2024 08:16 AM EST RP Workstation: [id number]   MR BRAIN WO CONTRAST Result Date: 09/18/2024 EXAM: MRI BRAIN WITHOUT CONTRAST 09/18/2024 01:10:00 AM TECHNIQUE: Multiplanar multisequence MRI of the head/brain was performed without the administration of intravenous contrast. COMPARISON: None available. CLINICAL HISTORY: Neuro deficit, acute, stroke suspected. FINDINGS: BRAIN AND VENTRICLES: There is a large area of acute ischemia affecting the posterior left MCA territory. Additionally, there are multiple punctate foci of acute ischemia within the centrum semiovale  bilaterally. 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 No intracranial  hemorrhage. No mass. No midline shift. No hydrocephalus. The sella is unremarkable. Normal flow voids. ORBITS: No significant abnormality. SINUSES AND MASTOIDS: No significant abnormality. BONES AND SOFT TISSUES: Normal marrow signal. No soft tissue abnormality. IMPRESSION: 1. Large area of acute ischemia affecting the posterior left MCA territory. 2. Multiple punctate foci of acute ischemia within the centrum semiovale bilaterally. 3. 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 Electronically signed by: Franky Stanford MD 09/18/2024 02:02 AM EST RP Workstation: [id number]   CT HEAD WO CONTRAST Result Date: 09/17/2024 EXAM: CT HEAD WITHOUT CONTRAST 09/17/2024 06:21:12 PM TECHNIQUE: CT of the head was performed without the administration of intravenous contrast. Automated exposure control, iterative reconstruction, and/or weight based adjustment of the mA/kV was utilized to reduce the radiation dose to as low as reasonably achievable. COMPARISON: None available. CLINICAL HISTORY: Neuro deficit, acute, stroke suspected. FINDINGS: BRAIN AND VENTRICLES: Low density in the posterior left temporal and parietal lobes compatible with acute infarction. No hemorrhage. No hydrocephalus. No extra-axial collection. No mass effect or midline shift. ORBITS: No acute abnormality. SINUSES: No acute abnormality. SOFT TISSUES AND SKULL: No acute soft tissue abnormality. No skull fracture. IMPRESSION: 1. Acute infarction in the posterior left temporal and parietal lobes. This could be further evaluated with MRI if felt clinically indicated. Electronically signed by: Franky Crease MD 09/17/2024 06:30 PM EST RP Workstation: [id number]   DG Chest 2 View Result Date: 09/17/2024 EXAM: 2 VIEW(S) XRAY OF THE CHEST 09/17/2024 06:07:00 PM COMPARISON: 08/31/2024 CLINICAL HISTORY: AMS FINDINGS: LINES, TUBES AND DEVICES: Right dialysis catheter  remains in place, unchanged. LUNGS AND PLEURA: Low lung volumes. No confluent opacities, effusions, or edema. No pneumothorax. HEART AND MEDIASTINUM: Mild cardiomegaly. Leadless pacer remains in place, unchanged. BONES AND SOFT TISSUES: No acute osseous abnormality. IMPRESSION: 1. No acute cardiopulmonary process. Electronically signed by: Franky Crease MD 09/17/2024 06:21 PM EST RP Workstation: [id number]   MR TIBIA FIBULA LEFT WO CONTRAST Result Date: 08/31/2024 CLINICAL DATA:  Bilateral leg wounds.  Concern for osteomyelitis. EXAM: MRI OF LOWER LEFT EXTREMITY WITHOUT CONTRAST TECHNIQUE: Multiplanar, multisequence MR imaging of the left tibia and fibula was performed. No intravenous contrast was administered. COMPARISON:  Radiographs dated 08/30/2024. FINDINGS: Bones/Joint/Cartilage Patchy serpiginous heterogenous signal abnormality in the proximal tibial diaphysis is most compatible with osteonecrosis. Fibula demonstrates normal marrow signal intensity. No convincing evidence of acute osteomyelitis. No fracture. Muscles and Tendons Generalized fatty atrophy with increased T2 signal of the posterior greater than anterior compartment calf musculature, likely reflecting chronic denervation changes, however, myositis is not excluded. No intramuscular collection. Intramuscular and intermuscular fat planes are maintained. Visualized patellar tendon is intact. Soft tissue Cutaneous wounds and irregularity of the anterior mid to distal shin. Diffuse cutaneous thickening and subcutaneous edema of the left calf. No loculated fluid collection. Findings are compatible with cellulitis in the appropriate setting. IMPRESSION: 1. No evidence of acute osteomyelitis. 2. Cutaneous wounds and irregularity of the anterior mid to distal shin. Diffuse cutaneous thickening and subcutaneous edema of the left calf. No loculated fluid collection. Findings are compatible with cellulitis in the appropriate setting. 3. Generalized fatty  atrophy with increased T2 signal of the posterior greater than anterior compartment calf musculature, likely reflecting chronic denervation changes, however, myositis is not excluded. No intramuscular collection. 4. 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 Electronically Signed   By: Harrietta Sherry M.D.   On: 08/31/2024 16:49   MR TIBIA FIBULA RIGHT WO CONTRAST Result Date: 08/31/2024 CLINICAL DATA:  Bilateral leg  wounds.  Concern for osteomyelitis. EXAM: MRI OF LOWER RIGHT EXTREMITY WITHOUT CONTRAST TECHNIQUE: Multiplanar, multisequence MR imaging of the right tibia and fibula was performed. No intravenous contrast was administered. 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 Fibula demonstrates normal marrow signal intensity. No convincing evidence of acute osteomyelitis. No fracture. Muscles and Tendons Generalized fatty atrophy with increased T2 signal of the anterior and posterior compartment calf musculature, likely reflecting chronic denervation changes, however, myositis is not excluded. No intramuscular collection. Intramuscular and intermuscular fat planes are maintained. Visualized patellar tendon is intact. Soft tissue Cutaneous wounds and irregularity of the mid to distal shin. Diffuse cutaneous thickening and subcutaneous edema of the right calf. No loculated fluid collection. Findings are compatible with cellulitis in the appropriate setting. IMPRESSION: 1. No evidence of acute osteomyelitis. 2. Cutaneous wounds and irregularity of the mid to distal shin. Diffuse cutaneous thickening and subcutaneous edema of the right calf. No loculated fluid collection. Findings are compatible with cellulitis in the appropriate setting. 3. Generalized fatty atrophy with increased T2 signal of the anterior and posterior compartment calf musculature,  likely reflecting chronic denervation changes, however, myositis is not excluded. No intramuscular collection. 4. 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 Electronically Signed   By: Harrietta Sherry M.D.   On: 08/31/2024 16:43   DG Chest Port 1 View Result Date: 08/31/2024 CLINICAL DATA:  Pacemaker. Technologist notes state complicated wound infection. EXAM: PORTABLE CHEST 1 VIEW COMPARISON:  Radiographs 03/22/2024 FINDINGS: Lead less pacemaker projects over the left mediastinum. Right-sided dialysis catheter tip in the right atrium. Overall low lung volumes. Cardiomegaly is stable. Aortic atherosclerosis. No pulmonary edema. No confluent airspace disease. No pleural fluid or pneumothorax. Grossly stable osseous structures. IMPRESSION: 1. Lead less pacemaker projects over the left mediastinum. 2. Stable cardiomegaly. Electronically Signed   By: Andrea Gasman M.D.   On: 08/31/2024 10:21   DG Tibia/Fibula Right Result Date: 08/30/2024 EXAM: 2 VIEW(S) XRAY OF THE RIGHT TIBIA AND FIBULA 08/30/2024 05:40:32 PM COMPARISON: None available. CLINICAL HISTORY: Complicated wound infection. FINDINGS: BONES AND JOINTS: There is some periosteal reaction along the mid and proximal diaphysis of the fibula. No cortical erosions are identified. No acute fracture. No malalignment. SOFT TISSUES: Diffuse soft tissue edema. Vascular calcifications and scattered phleboliths noted. IMPRESSION: 1. Periosteal reaction along the mid and proximal diaphysis of the fibula, with no cortical erosions identified. Findings can be seen in the setting of chronic osteomyelitis . 2. Diffuse soft tissue edema. Electronically signed by: Greig Pique MD 08/30/2024 06:55 PM EST RP Workstation: [id number]   DG Tibia/Fibula Left Result Date: 08/30/2024 CLINICAL DATA:  Wound infection. EXAM: DG TIBIA/FIBULA 2V*L* COMPARISON:  Radiograph dated 03/22/2024. FINDINGS: No acute fracture or  dislocation. The bones are osteopenic. There is diffuse subcutaneous edema and scattered areas of subcutaneous calcification suggestive of chronic venous stasis. No soft tissue gas. IMPRESSION: 1. No acute fracture or dislocation. 2. Diffuse subcutaneous edema.  No soft tissue gas. Electronically Signed   By: Vanetta Chou M.D.   On: 08/30/2024 18:52    Microbiology: Results for orders placed or performed during the hospital encounter of 08/30/24  Blood Culture (routine x 2)     Status: None   Collection Time: 08/30/24  5:21 PM   Specimen: BLOOD  Result Value Ref Range Status   Specimen Description BLOOD BLOOD RIGHT  HAND  Final   Special Requests   Final    BOTTLES DRAWN AEROBIC AND ANAEROBIC Blood Culture results may not be optimal due to an inadequate volume of blood received in culture bottles   Culture   Final    NO GROWTH 5 DAYS Performed at [facility], [address] [place], [place] [address]    Report Status 09/04/2024 FINAL  Final  Blood Culture (routine x 2)     Status: None   Collection Time: 08/30/24  5:25 PM   Specimen: BLOOD  Result Value Ref Range Status   Specimen Description BLOOD RIGHT ANTECUBITAL  Final   Special Requests   Final    BOTTLES DRAWN AEROBIC AND ANAEROBIC Blood Culture adequate volume   Culture   Final    NO GROWTH 5 DAYS Performed at [facility], [address] [place], [place] [address]    Report Status 09/04/2024 FINAL  Final  Aerobic Culture w Gram Stain (superficial specimen)     Status: None   Collection Time: 08/31/24  1:14 AM   Specimen: Leg; Wound  Result Value Ref Range Status   Specimen Description   Final    LEG Performed at [facility], [address], [place], [place] [address]    Special Requests   Final    NONE Performed at [facility], [address] [place], [place] [address]    Gram Stain   Final    NO WBC SEEN FEW GRAM NEGATIVE RODS RARE GRAM POSITIVE RODS RARE GRAM  POSITIVE COCCI    Culture   Final    ABUNDANT PSEUDOMONAS ALCALIGENES FEW ENTEROCOCCUS FAECALIS ABUNDANT STENOTROPHOMONAS MALTOPHILIA Sent to [facility] for further susceptibility testing. SEE SEPARATE REPORT Performed at [facility], [address] [place], [place] [address]    Report Status 09/10/2024 FINAL  Final   Organism ID, Bacteria ENTEROCOCCUS FAECALIS  Final   Organism ID, Bacteria PSEUDOMONAS ALCALIGENES  Final   Organism ID, Bacteria PSEUDOMONAS ALCALIGENES  Final      Susceptibility   Enterococcus faecalis - MIC*    AMPICILLIN <=2 SENSITIVE Sensitive     VANCOMYCIN  1 SENSITIVE Sensitive     GENTAMICIN SYNERGY SENSITIVE Sensitive     * FEW ENTEROCOCCUS FAECALIS   Pseudomonas alcaligenes - MIC*    MEROPENEM 8 INTERMEDIATE Intermediate     CIPROFLOXACIN 0.5 SENSITIVE Sensitive     PIP/TAZO Value in next row Intermediate      32 INTERMEDIATEThis is a modified FDA-approved test that has been validated and its performance characteristics determined by the reporting laboratory.  This laboratory is certified under the Clinical Laboratory Improvement Amendments CLIA as qualified to perform high complexity clinical laboratory testing.    CEFEPIME  Value in next row Sensitive      32 INTERMEDIATEThis is a modified FDA-approved test that has been validated and its performance characteristics determined by the reporting laboratory.  This laboratory is certified under the Clinical Laboratory Improvement Amendments CLIA as qualified to perform high complexity clinical laboratory testing.   Pseudomonas alcaligenes - MIC*    CEFOTAXIME (Non-meningitis) Value in next row Intermediate      32 INTERMEDIATEThis is a modified FDA-approved test that has been validated and its performance characteristics determined by the reporting laboratory.  This laboratory is certified under the Clinical Laboratory Improvement Amendments CLIA as qualified to perform high complexity clinical laboratory  testing.    * ABUNDANT PSEUDOMONAS ALCALIGENES    ABUNDANT PSEUDOMONAS ALCALIGENES  Susceptibility,  Aer + Anaerob     Status: Abnormal   Collection Time: 08/31/24  1:14 AM  Result Value Ref Range Status   Suscept, Aer + Anaerob Final report (A)  Corrected    Comment: (NOTE) Performed At: [facility] [address] [place], [place] [id number] Jennette Shorter MD Ey:[id number] CORRECTED ON 01/11 AT 0736: PREVIOUSLY REPORTED AS Preliminary report    Source PENDING  Incomplete  Susceptibility Result     Status: Abnormal   Collection Time: 08/31/24  1:14 AM  Result Value Ref Range Status   Suscept Result 1 Comment (A)  Final    Comment: (NOTE) Stenotrophomonas maltophilia Identification performed by account, not confirmed by this laboratory. Levofloxacin and Trimethoprim-Sulfamethoxazole should not be used alone for antimicrobial therapy (CLSI).    Antimicrobial Suscept Comment  Corrected    Comment: (NOTE)      ** S = Susceptible; I = Intermediate; R = Resistant **                   P = Positive; N = Negative            MICS are expressed in micrograms per mL   Antibiotic                 RSLT#1    RSLT#2    RSLT#3    RSLT#4 Levofloxacin                   S Minocycline                    S Trimethoprim/Sulfa             S Performed At: [facility] [address] [place], [place] [id number] Jennette Shorter MD Ey:[phone]     Labs: CBC: Recent Labs  Lab 09/17/24 1800 09/18/24 0558 09/19/24 0502 09/20/24 0515 09/21/24 0533  WBC 7.4 7.0 7.1 6.4 7.0  NEUTROABS 6.2  --   --   --   --   HGB 9.9* 9.3* 9.3* 9.4* 10.7*  HCT 32.5* 31.3* 31.5* 31.5* 34.9*  MCV 101.2* 101.3* 103.6* 101.0* 100.3*  PLT 275 252 249 255 237   Basic Metabolic Panel: Recent Labs  Lab 09/17/24 1800 09/18/24 0558 09/19/24 0502 09/20/24 0515 09/21/24 0533  NA 139 138 135 136 138  K 5.3* 5.3* 4.7 5.0 4.4  CL 90* 91* 91* 93* 90*  CO2 22 19* 22 20* 19*  GLUCOSE 200* 153* 158*  138* 127*  BUN 44* 47* 27* 36* 21*  CREATININE 5.20* 5.58* 4.05* 5.31* 3.72*  CALCIUM  10.4* 10.2 9.7 9.6 9.8  MG  --  2.2  --   --   --   PHOS  --  4.5  --   --   --    Liver Function Tests: Recent Labs  Lab 09/17/24 1800 09/18/24 0558  AST <10* <10*  ALT <5 <5  ALKPHOS 179* 158*  BILITOT 0.5 0.6  PROT 6.7 6.1*  ALBUMIN  3.9 3.6   CBG: Recent Labs  Lab 09/20/24 1618 09/20/24 1946 09/21/24 0016 09/21/24 0323 09/21/24 0731  GLUCAP 164* 186* 114* 128* 129*    Discharge time spent: 35 minutes.  Signed: Murvin Mana, MD [facility] 09/21/2024 "

## 2024-09-21 NOTE — Progress Notes (Signed)
 " [facility] CLINIC CARDIOLOGY PROGRESS NOTE       Patient ID: [name] MRN: [id number] DOB/AGE: 10-19-1965 59 y.o.  Admit date: 09/17/2024 Referring Physician Dr. Elvan Sor Primary Physician Franchot Isaiah LABOR, MD  Primary Cardiologist Dr. Wilburn Reason for Consultation Abnormal echo  HPI: [name] is a 59 y.o. 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 Found to have acute CVA on CT head in the ED. Echo with positive bubble study. Cardiology was consulted for further evaluation.   Interval history: - Patient seen and examined this morning, sitting upright in hospital bed eating breakfast. - Reports she is feeling overall better.  Feels that her speech is improving.  Denies any chest discomfort, shortness of breath, lightheadedness or dizziness. - Remains without events on telemetry.  BP and heart rate stable.  Review of systems complete and found to be negative unless listed above    Past Medical History:  Diagnosis Date   Allergy    Anemia    vitamin d3 deficiency   Anxiety    Asthma    Calciphylaxis    Chronic kidney disease    End Stage Renal Disease   Diabetes mellitus without complication (HCC)    GERD (gastroesophageal reflux disease)    nothing over last few years   Heart murmur    High serum parathyroid hormone (PTH)    checked through Dialysis   History of kidney stones 2000   Hypertension    Neuromuscular disorder (HCC)    neuropathy in feet   Osteoporosis    PONV (postoperative nausea and vomiting)    severe nausea requiring many doses of post op antiemetics    Past Surgical History:  Procedure Laterality Date    ABDOMINAL HYSTERECTOMY  2007   APPLICATION OF WOUND VAC Left 11/15/2021   Procedure: APPLICATION OF WOUND VAC;  Surgeon: Marchia Drivers, MD;  Location: [facility] ORS;  Service: Orthopedics;  Laterality: Left;  Prevena 13cm    APPLICATION OF WOUND VAC Right 12/14/2021   Procedure: APPLICATION OF WOUND VAC;  Surgeon: Tobie Priest, MD;  Location: [facility] ORS;  Service: Orthopedics;  Laterality: Right;  [id number]   AV FISTULA INSERTION W/ RF MAGNETIC GUIDANCE N/A 12/08/2017   Procedure: AV FISTULA INSERTION W/RF MAGNETIC GUIDANCE;  Surgeon: Jama Cordella MATSU, MD;  Location: [facility] INVASIVE CV LAB;  Service: Cardiovascular;  Laterality: N/A;   DIALYSIS/PERMA CATHETER INSERTION Right 10/03/2021   Procedure: DIALYSIS/PERMA CATHETER INSERTION;  Surgeon: Jama Cordella MATSU, MD;  Location: [facility] INVASIVE CV LAB;  Service: Cardiovascular;  Laterality: Right;   HIP ARTHROPLASTY Left 09/29/2021   Procedure: ARTHROPLASTY BIPOLAR HIP (HEMIARTHROPLASTY);  Surgeon: Leora Lynwood SAUNDERS, MD;  Location: [facility] ORS;  Service: Orthopedics;  Laterality: Left;   HIP ARTHROPLASTY Right 12/14/2021   Procedure: ARTHROPLASTY BIPOLAR HIP (HEMIARTHROPLASTY);  Surgeon: Tobie Priest, MD;  Location: [facility] ORS;  Service: Orthopedics;  Laterality: Right;   INCISION AND DRAINAGE HIP Left 11/15/2021   Procedure: IRRIGATION AND DEBRIDEMENT LEFT HIP WOUND;  Surgeon: Marchia Drivers, MD;  Location: [facility] ORS;  Service: Orthopedics;  Laterality: Left;   PACEMAKER LEADLESS INSERTION N/A 03/09/2024   Procedure: PACEMAKER LEADLESS INSERTION;  Surgeon: Nancey Eulas BRAVO, MD;  Location: [facility] INVAS[facility] CV LAB;  Service:  Cardiovascular;  Laterality: N/A;   QUADRICEPS TENDON REPAIR Right 10/16/2021   Procedure: REPAIR QUADRICEP TENDON;  Surgeon: Doll Skates, MD;  Location: [facility] OR;  Service: Orthopedics;  Laterality: Right;   ROTATOR CUFF REPAIR Right 2004   SHOULDER CLOSED REDUCTION Right 2004   UPPER EXTREMITY VENOGRAPHY Left 02/15/2018   Procedure: UPPER EXTREMITY  VENOGRAPHY;  Surgeon: Jama Cordella MATSU, MD;  Location: [facility] IN[facility];  Service: Cardiovascular;  Laterality: Left;    Medications Prior to Admission  Medication Sig Dispense Refill Last Dose/Taking   albuterol  (VENTOLIN  HFA) 108 (90 Base) MCG/ACT inhaler Inhale 2 puffs into the lungs every 6 (six) hours as needed for wheezing or shortness of breath. 8 g 2 Unknown   B Complex-C-Folic Acid  (RENA-VITE PO) Take 1 tablet by mouth daily.   09/17/2024   Dupilumab 300 MG/2ML SOAJ Inject 300 mg into the skin.   Unknown   gabapentin  (NEURONTIN ) 100 MG capsule Take 1 capsule (100 mg total) by mouth at bedtime. 60 capsule 1 09/16/2024   ipratropium-albuterol  (DUONEB) 0.5-2.5 (3) MG/3ML SOLN Take 3 mLs by nebulization 4 (four) times daily. (Patient taking differently: Take 3 mLs by nebulization every 6 (six) hours as needed. Only as needed)   Unknown   naloxone  (NARCAN ) nasal spray 4 mg/0.1 mL Place 1 spray into the nose as needed for up to 365 doses (for opioid-induced respiratory depresssion). In case of emergency (overdose), spray once into each nostril. If no response within 3 minutes, repeat application and call 911. 1 each 1 Unknown   NOVOLOG  FLEXPEN 100 UNIT/ML FlexPen Inject 14 Units into the skin 3 (three) times daily with meals.   09/17/2024   oxyCODONE  (OXY IR/ROXICODONE ) 5 MG immediate release tablet Take 1 tablet (5 mg total) by mouth every 12 (twelve) hours as needed for severe pain (pain score 7-10). Must last 30 days. 60 tablet 0 Unknown   pentoxifylline  (TRENTAL ) 400 MG CR tablet Take 1 tablet (400 mg total) by mouth daily. 30 tablet 2 Unknown   sevelamer  carbonate (RENVELA ) 800 MG tablet Take 2,400 mg by mouth 3 (three) times daily with meals.   09/17/2024   SODIUM THIOSULFATE  IV Inject 12.5 mg into the vein 3 (three) times a week.   09/15/2024   Tenapanor HCl, CKD, 30 MG TABS Take 30 mg by mouth 2 (two) times daily.   09/16/2024   Continuous Glucose Sensor (DEXCOM G7 SENSOR) MISC Use to  check blood sugar as needed for insulin  dependent type 2 diabetes 9 each 3    Continuous Glucose Sensor (DEXCOM G7 SENSOR) MISC Use to monitor blood sugar      [START ON 10/10/2024] oxyCODONE  (OXY IR/ROXICODONE ) 5 MG immediate release tablet Take 1 tablet (5 mg total) by mouth every 12 (twelve) hours as needed for severe pain (pain score 7-10). Must last 30 days. 60 tablet 0    [START ON 11/09/2024] oxyCODONE  (OXY IR/ROXICODONE ) 5 MG immediate release tablet Take 1 tablet (5 mg total) by mouth every 12 (twelve) hours as needed for severe pain (pain score 7-10). Must last 30 days. 60 tablet 0    Social History   Socioeconomic History   Marital status: Married    Spouse name: Redell   Number of children: 0   Years of education: Not on file   Highest education level: Some college, no degree  Occupational History   Occupation: Scientist, [employer]  Tobacco Use   Smoking status: Never   Smokeless tobacco: Never  Vaping Use  Vaping status: Never Used  Substance and Sexual Activity   Alcohol use: Never   Drug use: Never   Sexual activity: Not Currently    Partners: Male    Birth control/protection: None  Other Topics Concern   Not on file  Social History Narrative   Not on file   Social Drivers of Health   Tobacco Use: Low Risk (09/17/2024)   Patient History    Smoking Tobacco Use: Never    Smokeless Tobacco Use: Never    Passive Exposure: Not on file  Financial Resource Strain: Low Risk (07/25/2024)   Overall Financial Resource Strain (CARDIA)    Difficulty of Paying Living Expenses: Not hard at all  Food Insecurity: No Food Insecurity (09/17/2024)   Epic    Worried About Programme Researcher, Broadcasting/film/video in the Last Year: Never true    Ran Out of Food in the Last Year: Never true  Transportation Needs: No Transportation Needs (09/17/2024)   Epic    Lack of Transportation (Medical): No    Lack of Transportation (Non-Medical): No  Physical Activity: Inactive (07/25/2024)   Exercise Vital  Sign    Days of Exercise per Week: 0 days    Minutes of Exercise per Session: Not on file  Stress: Not on file  Social Connections: Moderately Isolated (09/17/2024)   Social Connection and Isolation Panel    Frequency of Communication with Friends and Family: More than three times a week    Frequency of Social Gatherings with Friends and Family: Three times a week    Attends Religious Services: Never    Active Member of Clubs or Organizations: No    Attends Banker Meetings: Never    Marital Status: Married  Catering Manager Violence: Not At Risk (09/17/2024)   Epic    Fear of Current or Ex-Partner: No    Emotionally Abused: No    Physically Abused: No    Sexually Abused: No  Depression (PHQ2-9): Low Risk (09/07/2024)   Depression (PHQ2-9)    PHQ-2 Score: 0  Recent Concern: Depression (PHQ2-9) - Medium Risk (07/03/2024)   Depression (PHQ2-9)    PHQ-2 Score: 6  Alcohol Screen: Not on file  Housing: Low Risk (09/17/2024)   Epic    Unable to Pay for Housing in the Last Year: No    Number of Times Moved in the Last Year: 0    Homeless in the Last Year: No  Utilities: Not At Risk (09/17/2024)   Epic    Threatened with loss of utilities: No  Health Literacy: Not on file    Family History  Problem Relation Age of Onset   Emphysema Mother    COPD Mother    Cancer Father    Cancer Paternal Aunt    Diabetes Sister    Hypertension Sister    Eczema Sister    Diabetes Brother    Hypertension Brother    Cardiomyopathy Brother    Alcohol abuse Brother      Vitals:   09/20/24 1916 09/20/24 2316 09/21/24 0315 09/21/24 0715  BP: 109/80 93/61 100/66 103/71  Pulse: 89 86 85 85  Resp: 18 16 16    Temp: 98.1 F (36.7 C) 98.3 F (36.8 C) 97.7 F (36.5 C) 97.6 F (36.4 C)  TempSrc:   Oral Oral  SpO2: 100% 98% 97% 100%  Weight:      Height:        PHYSICAL EXAM General: Chronically ill-appearing female, well nourished, in no acute distress. HEENT:  Normocephalic and  atraumatic. Neck: No JVD.  Lungs: Normal respiratory effort on room air. Clear bilaterally to auscultation. No wheezes, crackles, rhonchi.  Heart: HRR. Normal S1 and S2 without gallops.  + Murmur.  Abdomen: Non-distended appearing.  Msk: Normal strength and tone for age. Extremities: Warm and well perfused. No clubbing, cyanosis.  No notable edema, leg wraps in place.  Neuro: Alert and oriented X 3. Psych: Answers questions appropriately.   Labs: Basic Metabolic Panel: Recent Labs    09/20/24 0515 09/21/24 0533  NA 136 138  K 5.0 4.4  CL 93* 90*  CO2 20* 19*  GLUCOSE 138* 127*  BUN 36* 21*  CREATININE 5.31* 3.72*  CALCIUM  9.6 9.8   Liver Function Tests: No results for input(s): AST, ALT, ALKPHOS, BILITOT, PROT, ALBUMIN  in the last 72 hours.  No results for input(s): LIPASE, AMYLASE in the last 72 hours. CBC: Recent Labs    09/20/24 0515 09/21/24 0533  WBC 6.4 7.0  HGB 9.4* 10.7*  HCT 31.5* 34.9*  MCV 101.0* 100.3*  PLT 255 237   Cardiac Enzymes: No results for input(s): CKTOTAL, CKMB, CKMBINDEX, TROPONINIHS in the last 72 hours. BNP: No results for input(s): BNP in the last 72 hours. D-Dimer: No results for input(s): DDIMER in the last 72 hours. Hemoglobin A1C: No results for input(s): HGBA1C in the last 72 hours.  Fasting Lipid Panel: No results for input(s): CHOL, HDL, LDLCALC, TRIG, CHOLHDL, LDLDIRECT in the last 72 hours.  Thyroid  Function Tests: No results for input(s): TSH, T4TOTAL, T3FREE, THYROIDAB in the last 72 hours.  Invalid input(s): FREET3 Anemia Panel: No results for input(s): VITAMINB12, FOLATE, FERRITIN, TIBC, IRON , RETICCTPCT in the last 72 hours.    Radiology: US  Venous Img Lower Bilateral (DVT) Result Date: 09/20/2024 CLINICAL DATA:  Edema EXAM: Bilateral Lower Extremity Venous Doppler Ultrasound TECHNIQUE: Gray-scale sonography with compression, as well as color and duplex  ultrasound, were performed to evaluate the deep venous system(s) from the level of the common femoral vein through the popliteal and proximal calf veins. COMPARISON:  05/01/2024 FINDINGS: VENOUS Normal compressibility of the common femoral, femoral, and popliteal veins, as well as the visualized calf veins. Visualized portions of profunda femoral vein and great saphenous vein unremarkable. No filling defects to suggest DVT on grayscale or color Doppler imaging. Doppler waveforms show normal direction of venous flow, normal respiratory plasticity and response to augmentation. OTHER None. Limitations: Limited visualization of the calf veins bilaterally. IMPRESSION: No lower extremity DVT. Electronically Signed   By: Aliene Lloyd M.D.   On: 09/20/2024 17:44   ECHOCARDIOGRAM COMPLETE Result Date: 09/19/2024    ECHOCARDIOGRAM REPORT   Patient Name:   [name] Date of Exam: 09/19/2024 Medical Rec #:  [id number]    Height:       67.0 in Accession #:    [id number]   Weight:       183.6 lb Date of Birth:  [birth year]    BSA:          1.950 m Patient Age:    58 years     BP:           90/59 mmHg Patient Gender: F            HR:           85 bpm. Exam Location:  [facility] Procedure: 2D Echo, Cardiac Doppler, Color Doppler and Saline Contrast Bubble            Study (Both Spectral and Color  Flow Doppler were utilized during            procedure). Indications:     Stroke I63.9  History:         Patient has prior history of Echocardiogram examinations, most                  recent 05/03/2024. Signs/Symptoms:Murmur; Risk Factors:Diabetes                  and Hypertension.  Sonographer:     Christopher Furnace Referring Phys:  [id number] OLADAPO ADEFESO Diagnosing Phys: Cara JONETTA Lovelace MD  Sonographer Comments: Suboptimal apical window. IMPRESSIONS  1. Left ventricular ejection fraction, by estimation, is 35 to 40%. The left ventricle has moderately decreased function. The left ventricle demonstrates global hypokinesis. The left ventricular  internal cavity size was moderately to severely dilated. Left ventricular diastolic parameters are consistent with Grade III diastolic dysfunction (restrictive). There is the interventricular septum is flattened in diastole ('D' shaped left ventricle), consistent with right ventricular volume overload.  2. Right ventricular systolic function is mildly reduced. The right ventricular size is mildly enlarged.  3. Left atrial size was moderately dilated.  4. Right atrial size was mildly dilated.  5. The mitral valve is abnormal. Moderate to severe mitral valve regurgitation. Moderate mitral annular calcification.  6. Tricuspid valve regurgitation is severe.  7. The aortic valve is calcified. There is moderate calcification of the aortic valve. There is moderate thickening of the aortic valve. Aortic valve regurgitation is mild. Moderate to severe aortic valve stenosis.  8. Agitated saline contrast bubble study was positive with shunting observed within 3-6 cardiac cycles suggestive of interatrial shunt. FINDINGS  Left Ventricle: Left ventricular ejection fraction, by estimation, is 35 to 40%. The left ventricle has moderately decreased function. The left ventricle demonstrates global hypokinesis. Strain was performed and the global longitudinal strain is indeterminate. The left ventricular internal cavity size was moderately to severely dilated. There is borderline asymmetric left ventricular hypertrophy. The interventricular septum is flattened in diastole ('D' shaped left ventricle), consistent with right ventricular volume overload. Left ventricular diastolic parameters are consistent with Grade III diastolic dysfunction (restrictive). Right Ventricle: The right ventricular size is mildly enlarged. No increase in right ventricular wall thickness. Right ventricular systolic function is mildly reduced. Left Atrium: Left atrial size was moderately dilated. Right Atrium: Right atrial size was mildly dilated. Pericardium:  There is no evidence of pericardial effusion. Mitral Valve: The mitral valve is abnormal. There is moderate thickening of the mitral valve leaflet(s). There is moderate calcification of the mitral valve leaflet(s). Moderate mitral annular calcification. Moderate to severe mitral valve regurgitation.  MV peak gradient, 12.7 mmHg. The mean mitral valve gradient is 6.0 mmHg. Tricuspid Valve: The tricuspid valve is grossly normal. Tricuspid valve regurgitation is severe. Aortic Valve: The aortic valve is calcified. There is moderate calcification of the aortic valve. There is moderate thickening of the aortic valve. There is moderate aortic valve annular calcification. Aortic valve regurgitation is mild. Moderate to severe aortic stenosis is present. Aortic valve mean gradient measures 13.7 mmHg. Aortic valve peak gradient measures 22.8 mmHg. Aortic valve area, by VTI measures 0.84 cm. Pulmonic Valve: The pulmonic valve was not well visualized. Pulmonic valve regurgitation is not visualized. Aorta: The aortic root was not well visualized. IAS/Shunts: The interatrial septum was not well visualized. Agitated saline contrast was given intravenously to evaluate for intracardiac shunting. Agitated saline contrast bubble study was positive with shunting observed within 3-6  cardiac cycles suggestive of interatrial shunt. Additional Comments: 3D was performed not requiring image post processing on an independent workstation and was indeterminate.  LEFT VENTRICLE PLAX 2D LVIDd:         5.80 cm      Diastology LVIDs:         4.60 cm      LV e' medial:    5.11 cm/s LV PW:         0.80 cm      LV E/e' medial:  26.8 LV IVS:        1.10 cm      LV e' lateral:   10.60 cm/s LVOT diam:     2.00 cm      LV E/e' lateral: 12.9 LV SV:         31 LV SV Index:   16 LVOT Area:     3.14 cm LV IVRT:       74 msec  LV Volumes (MOD) LV vol d, MOD A4C: 112.0 ml LV vol s, MOD A4C: 80.2 ml LV SV MOD A4C:     112.0 ml RIGHT VENTRICLE RV Basal diam:   3.60 cm RV Mid diam:    3.10 cm RV S prime:     5.33 cm/s LEFT ATRIUM            Index        RIGHT ATRIUM           Index LA diam:      4.50 cm  2.31 cm/m   RA Area:     15.80 cm LA Vol (A4C): 115.0 ml 58.97 ml/m  RA Volume:   35.40 ml  18.15 ml/m  AORTIC VALVE AV Area (Vmax):    0.78 cm AV Area (Vmean):   0.71 cm AV Area (VTI):     0.84 cm AV Vmax:           239.00 cm/s AV Vmean:          170.667 cm/s AV VTI:            0.372 m AV Peak Grad:      22.8 mmHg AV Mean Grad:      13.7 mmHg LVOT Vmax:         59.10 cm/s LVOT Vmean:        38.700 cm/s LVOT VTI:          0.099 m LVOT/AV VTI ratio: 0.27  AORTA Ao Root diam: 2.90 cm MITRAL VALVE                  TRICUSPID VALVE MV Area (PHT): 3.76 cm       TR Peak grad:   35.3 mmHg MV Area VTI:   0.80 cm       TR Vmax:        297.00 cm/s MV Peak grad:  12.7 mmHg MV Mean grad:  6.0 mmHg       SHUNTS MV Vmax:       1.78 m/s       Systemic VTI:  0.10 m MV Vmean:      109.0 cm/s     Systemic Diam: 2.00 cm MV Decel Time: 202 msec MR Peak grad:    86.9 mmHg MR Mean grad:    48.0 mmHg MR Vmax:         466.00 cm/s MR Vmean:        321.0 cm/s MR PISA:  6.28 cm MR PISA Eff ROA: 44 mm MR PISA Radius:  1.00 cm MV E velocity: 137.00 cm/s MV A velocity: 45.60 cm/s MV E/A ratio:  3.00 Cara JONETTA Lovelace MD Electronically signed by Cara JONETTA Lovelace MD Signature Date/Time: 09/19/2024/1:48:52 PM    Final    US  Carotid Bilateral Result Date: 09/19/2024 EXAM: US  CAROTID DUPLEX 09/19/2024 07:52:19 AM TECHNIQUE: Real-time grayscale, color flow and spectral Doppler sonographic images were obtained of the extracranial carotid system using a linear transducer. COMPARISON: None available CLINICAL HISTORY: Acute stroke due to ischemia (HCC), hypertension, stroke/TIA, hyperlipidemia, diabetes. FINDINGS: RIGHT: Common carotid artery: 52 cm/s Internal carotid artery: 60 cm/s External carotid artery: 48 cm/s Right ICA/CCA ratio: 1.2 Plaque: Calcified plaque in the carotid bulb  resulting in only mild stenosis. Scattered eccentric nonocclusive partially calcified plaque in the right common carotid artery. No focal color aliasing. Waveforms are normal. Vertebral artery: Antegrade LEFT: Common carotid artery: 53 cm/s Internal carotid artery: 75 cm/s External carotid artery: 59 cm/s LEFT ICA/CCA ratio: 1.4 Plaque: Eccentric partially calcified plaque in the bulb resulting in mild stenosis. Scattered nonocclusive plaque in the common carotid artery. Normal color Doppler signal and waveforms. Vertebral artery: Antegrade IMPRESSION: 1. No hemodynamically significant stenosis (greater than 50%) within the extracranial internal carotid arteries. 2. Mild stenosis in the right carotid bulb from calcified plaque and in the left carotid bulb from eccentric partially calcified plaque. Electronically signed by: Katheleen Faes MD 09/19/2024 08:16 AM EST RP Workstation: [id number]   MR BRAIN WO CONTRAST Result Date: 09/18/2024 EXAM: MRI BRAIN WITHOUT CONTRAST 09/18/2024 01:10:00 AM TECHNIQUE: Multiplanar multisequence MRI of the head/brain was performed without the administration of intravenous contrast. COMPARISON: None available. CLINICAL HISTORY: Neuro deficit, acute, stroke suspected. FINDINGS: BRAIN AND VENTRICLES: There is a large area of acute ischemia affecting the posterior left MCA territory. Additionally, there are multiple punctate foci of acute ischemia within the centrum semiovale bilaterally. 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 No intracranial hemorrhage. No mass. No midline shift. No hydrocephalus. The sella is unremarkable. Normal flow voids. ORBITS: No significant abnormality. SINUSES AND MASTOIDS: No significant abnormality. BONES AND SOFT TISSUES: Normal marrow signal. No soft tissue abnormality. IMPRESSION: 1. Large area of acute ischemia affecting the posterior left MCA territory. 2. Multiple punctate foci of acute ischemia within the centrum semiovale  bilaterally. 3. 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 Electronically signed by: Franky Stanford MD 09/18/2024 02:02 AM EST RP Workstation: [id number]   CT HEAD WO CONTRAST Result Date: 09/17/2024 EXAM: CT HEAD WITHOUT CONTRAST 09/17/2024 06:21:12 PM TECHNIQUE: CT of the head was performed without the administration of intravenous contrast. Automated exposure control, iterative reconstruction, and/or weight based adjustment of the mA/kV was utilized to reduce the radiation dose to as low as reasonably achievable. COMPARISON: None available. CLINICAL HISTORY: Neuro deficit, acute, stroke suspected. FINDINGS: BRAIN AND VENTRICLES: Low density in the posterior left temporal and parietal lobes compatible with acute infarction. No hemorrhage. No hydrocephalus. No extra-axial collection. No mass effect or midline shift. ORBITS: No acute abnormality. SINUSES: No acute abnormality. SOFT TISSUES AND SKULL: No acute soft tissue abnormality. No skull fracture. IMPRESSION: 1. Acute infarction in the posterior left temporal and parietal lobes. This could be further evaluated with MRI if felt clinically indicated. Electronically signed by: Franky Crease MD 09/17/2024 06:30 PM EST RP Workstation: [id number]   DG Chest 2 View Result Date: 09/17/2024 EXAM: 2 VIEW(S) XRAY OF THE CHEST 09/17/2024 06:07:00 PM COMPARISON: 08/31/2024 CLINICAL HISTORY: AMS  FINDINGS: LINES, TUBES AND DEVICES: Right dialysis catheter remains in place, unchanged. LUNGS AND PLEURA: Low lung volumes. No confluent opacities, effusions, or edema. No pneumothorax. HEART AND MEDIASTINUM: Mild cardiomegaly. Leadless pacer remains in place, unchanged. BONES AND SOFT TISSUES: No acute osseous abnormality. IMPRESSION: 1. No acute cardiopulmonary process. Electronically signed by: Franky Crease MD 09/17/2024 06:21 PM EST RP Workstation: [id number]   MR TIBIA FIBULA LEFT WO CONTRAST Result Date: 08/31/2024 CLINICAL DATA:  Bilateral leg wounds.  Concern for  osteomyelitis. EXAM: MRI OF LOWER LEFT EXTREMITY WITHOUT CONTRAST TECHNIQUE: Multiplanar, multisequence MR imaging of the left tibia and fibula was performed. No intravenous contrast was administered. COMPARISON:  Radiographs dated 08/30/2024. FINDINGS: Bones/Joint/Cartilage Patchy serpiginous heterogenous signal abnormality in the proximal tibial diaphysis is most compatible with osteonecrosis. Fibula demonstrates normal marrow signal intensity. No convincing evidence of acute osteomyelitis. No fracture. Muscles and Tendons Generalized fatty atrophy with increased T2 signal of the posterior greater than anterior compartment calf musculature, likely reflecting chronic denervation changes, however, myositis is not excluded. No intramuscular collection. Intramuscular and intermuscular fat planes are maintained. Visualized patellar tendon is intact. Soft tissue Cutaneous wounds and irregularity of the anterior mid to distal shin. Diffuse cutaneous thickening and subcutaneous edema of the left calf. No loculated fluid collection. Findings are compatible with cellulitis in the appropriate setting. IMPRESSION: 1. No evidence of acute osteomyelitis. 2. Cutaneous wounds and irregularity of the anterior mid to distal shin. Diffuse cutaneous thickening and subcutaneous edema of the left calf. No loculated fluid collection. Findings are compatible with cellulitis in the appropriate setting. 3. Generalized fatty atrophy with increased T2 signal of the posterior greater than anterior compartment calf musculature, likely reflecting chronic denervation changes, however, myositis is not excluded. No intramuscular collection. 4. 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 Electronically Signed   By: Harrietta Sherry M.D.   On: 08/31/2024 16:49   MR TIBIA FIBULA RIGHT WO CONTRAST Result Date: 08/31/2024 CLINICAL DATA:  Bilateral leg wounds.  Concern for osteomyelitis. EXAM: MRI OF LOWER RIGHT  EXTREMITY WITHOUT CONTRAST TECHNIQUE: Multiplanar, multisequence MR imaging of the right tibia and fibula was performed. No intravenous contrast was administered. 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 Fibula demonstrates normal marrow signal intensity. No convincing evidence of acute osteomyelitis. No fracture. Muscles and Tendons Generalized fatty atrophy with increased T2 signal of the anterior and posterior compartment calf musculature, likely reflecting chronic denervation changes, however, myositis is not excluded. No intramuscular collection. Intramuscular and intermuscular fat planes are maintained. Visualized patellar tendon is intact. Soft tissue Cutaneous wounds and irregularity of the mid to distal shin. Diffuse cutaneous thickening and subcutaneous edema of the right calf. No loculated fluid collection. Findings are compatible with cellulitis in the appropriate setting. IMPRESSION: 1. No evidence of acute osteomyelitis. 2. Cutaneous wounds and irregularity of the mid to distal shin. Diffuse cutaneous thickening and subcutaneous edema of the right calf. No loculated fluid collection. Findings are compatible with cellulitis in the appropriate setting. 3. Generalized fatty atrophy with increased T2 signal of the anterior and posterior compartment calf musculature, likely reflecting chronic denervation changes, however, myositis is not excluded. No intramuscular collection. 4. 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 Electronically Signed   By: Harrietta Sherry M.D.   On: 08/31/2024 16:43   DG Chest Port 1 View Result Date:  08/31/2024 CLINICAL DATA:  Pacemaker. Technologist notes state complicated wound infection. EXAM: PORTABLE CHEST 1 VIEW COMPARISON:  Radiographs 03/22/2024 FINDINGS: Lead less pacemaker  projects over the left mediastinum. Right-sided dialysis catheter tip in the right atrium. Overall low lung volumes. Cardiomegaly is stable. Aortic atherosclerosis. No pulmonary edema. No confluent airspace disease. No pleural fluid or pneumothorax. Grossly stable osseous structures. IMPRESSION: 1. Lead less pacemaker projects over the left mediastinum. 2. Stable cardiomegaly. Electronically Signed   By: Andrea Gasman M.D.   On: 08/31/2024 10:21   DG Tibia/Fibula Right Result Date: 08/30/2024 EXAM: 2 VIEW(S) XRAY OF THE RIGHT TIBIA AND FIBULA 08/30/2024 05:40:32 PM COMPARISON: None available. CLINICAL HISTORY: Complicated wound infection. FINDINGS: BONES AND JOINTS: There is some periosteal reaction along the mid and proximal diaphysis of the fibula. No cortical erosions are identified. No acute fracture. No malalignment. SOFT TISSUES: Diffuse soft tissue edema. Vascular calcifications and scattered phleboliths noted. IMPRESSION: 1. Periosteal reaction along the mid and proximal diaphysis of the fibula, with no cortical erosions identified. Findings can be seen in the setting of chronic osteomyelitis . 2. Diffuse soft tissue edema. Electronically signed by: Greig Pique MD 08/30/2024 06:55 PM EST RP Workstation: [id number]   DG Tibia/Fibula Left Result Date: 08/30/2024 CLINICAL DATA:  Wound infection. EXAM: DG TIBIA/FIBULA 2V*L* COMPARISON:  Radiograph dated 03/22/2024. FINDINGS: No acute fracture or dislocation. The bones are osteopenic. There is diffuse subcutaneous edema and scattered areas of subcutaneous calcification suggestive of chronic venous stasis. No soft tissue gas. IMPRESSION: 1. No acute fracture or dislocation. 2. Diffuse subcutaneous edema.  No soft tissue gas. Electronically Signed   By: Vanetta Chou M.D.   On: 08/30/2024 18:52    ECHO as above  TELEMETRY (personally reviewed): Ventricular pacing rate 80s  EKG (personally reviewed): Ventricular pacing rate 86 bpm  Data  reviewed by me 09/21/2024: last 24h vitals tele labs imaging I/O ED provider note, admission H&P, hospitalist progress note, neurology notes  Principal Problem:   Acute ischemic stroke [facility]) Active Problems:   ESRD on dialysis (HCC)   Calciphylaxis   Chronic systolic CHF (congestive heart failure) (HCC)   PFO (patent foramen ovale)   Severe aortic stenosis    ASSESSMENT AND PLAN:  [name] is a 59 y.o. 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 Found to have acute CVA on CT head in the ED. Echo with positive bubble study. Cardiology was consulted for further evaluation.   # Acute CVA # Positive bubble study # Chronic HFrEF # Valvular heart disease # ESRD on HD Patient with history of HFrEF, known valvular disease presented due to confusion and was found to have acute CVA on brain imaging.  MRI brain with large area of acute ischemia in the posterior left MCA territory as well as multiple punctate foci of acute ischemia in centrum semiovale bilaterally with old infarcts noted as well.  Echo this admission revealed EF 35-40%, global hypokinesis, grade 3 diastolic dysfunction, moderate to severe MR, moderate to severe AS, severe TR.  Echo also noted positive bubble study with a few bubbles crossing IAS. - There is concern for cardioembolic source of acute CVA based on MRI brain findings. -  Would not recommend further workup of possible small PFO as she has poor functional status at baseline with multiple valvular issues which have been known for many months as well as HFrEF. -Minimal and flat troponin most consistent with  demand/supply mismatch and not ACS.   Stable for discharge to SNF today from cardiac perspective.  This patient's plan of care was discussed and created with Dr. Florencio and he is in agreement.  Signed: Danita Bloch, PA-C  09/21/2024, 8:58 AM [facility]      "

## 2024-09-21 NOTE — Plan of Care (Signed)

## 2024-09-21 NOTE — Progress Notes (Signed)
 The patient and her husband told this clinical research associate that they did not want the dressings to the patient's BLE to be changed and that they do not want the dressings to be removed to measure the wounds to the patient's BLE because the dressings were changed at the end of 7a-7p shift and that they don't think that the dressings need to be changed again so soon.

## 2024-09-25 ENCOUNTER — Encounter: Admitting: Physician Assistant

## 2024-09-25 ENCOUNTER — Ambulatory Visit

## 2024-09-28 ENCOUNTER — Encounter: Admitting: Physician Assistant

## 2024-10-02 ENCOUNTER — Encounter: Admitting: Physician Assistant

## 2024-10-09 ENCOUNTER — Encounter: Admitting: Physician Assistant

## 2024-10-24 ENCOUNTER — Encounter

## 2024-10-31 ENCOUNTER — Ambulatory Visit

## 2024-11-27 ENCOUNTER — Encounter: Admitting: Nurse Practitioner

## 2025-01-23 ENCOUNTER — Encounter

## 2025-01-30 ENCOUNTER — Ambulatory Visit

## 2025-04-24 ENCOUNTER — Encounter

## 2025-05-01 ENCOUNTER — Ambulatory Visit

## 2025-07-31 ENCOUNTER — Ambulatory Visit
